# Patient Record
Sex: Female | Born: 1950 | Race: Black or African American | Hispanic: No | Marital: Single | Smoking: Never smoker
Health system: Southern US, Community
[De-identification: ages and names within clinical notes are randomized; demographics above are authoritative.]

## PROBLEM LIST (undated history)

## (undated) DIAGNOSIS — C73 Malignant neoplasm of thyroid gland: Secondary | ICD-10-CM

## (undated) DIAGNOSIS — K219 Gastro-esophageal reflux disease without esophagitis: Secondary | ICD-10-CM

## (undated) DIAGNOSIS — I6523 Occlusion and stenosis of bilateral carotid arteries: Principal | ICD-10-CM

## (undated) DIAGNOSIS — C50919 Malignant neoplasm of unspecified site of unspecified female breast: Secondary | ICD-10-CM

## (undated) DIAGNOSIS — L84 Corns and callosities: Secondary | ICD-10-CM

## (undated) DIAGNOSIS — R002 Palpitations: Secondary | ICD-10-CM

## (undated) DIAGNOSIS — G4730 Sleep apnea, unspecified: Secondary | ICD-10-CM

## (undated) DIAGNOSIS — I10 Essential (primary) hypertension: Secondary | ICD-10-CM

## (undated) DIAGNOSIS — R079 Chest pain, unspecified: Secondary | ICD-10-CM

## (undated) DIAGNOSIS — E039 Hypothyroidism, unspecified: Secondary | ICD-10-CM

## (undated) DIAGNOSIS — E7800 Pure hypercholesterolemia, unspecified: Secondary | ICD-10-CM

## (undated) DIAGNOSIS — M1990 Unspecified osteoarthritis, unspecified site: Secondary | ICD-10-CM

## (undated) DIAGNOSIS — E119 Type 2 diabetes mellitus without complications: Secondary | ICD-10-CM

## (undated) DIAGNOSIS — R519 Headache, unspecified: Secondary | ICD-10-CM

## (undated) DIAGNOSIS — Z9289 Personal history of other medical treatment: Secondary | ICD-10-CM

## (undated) DIAGNOSIS — Z8719 Personal history of other diseases of the digestive system: Secondary | ICD-10-CM

## (undated) DIAGNOSIS — J45909 Unspecified asthma, uncomplicated: Secondary | ICD-10-CM

## (undated) DIAGNOSIS — G7000 Myasthenia gravis without (acute) exacerbation: Secondary | ICD-10-CM

## (undated) DIAGNOSIS — R011 Cardiac murmur, unspecified: Secondary | ICD-10-CM

## (undated) DIAGNOSIS — R262 Difficulty in walking, not elsewhere classified: Secondary | ICD-10-CM

## (undated) DIAGNOSIS — R0602 Shortness of breath: Secondary | ICD-10-CM

## (undated) HISTORY — PX: CHOLECYSTECTOMY: SHX55

## (undated) HISTORY — PX: ANTERIOR CERVICAL DECOMP/DISCECTOMY FUSION: SHX1161

## (undated) HISTORY — DX: Palpitations: R00.2

## (undated) HISTORY — PX: CARDIAC CATHETERIZATION: SHX172

## (undated) HISTORY — DX: Difficulty in walking, not elsewhere classified: R26.2

## (undated) HISTORY — PX: APPENDECTOMY: SHX54

## (undated) HISTORY — PX: ABDOMINAL HYSTERECTOMY: SHX81

## (undated) HISTORY — DX: Personal history of other medical treatment: Z92.89

## (undated) HISTORY — DX: Corns and callosities: L84

## (undated) HISTORY — DX: Pure hypercholesterolemia, unspecified: E78.00

## (undated) HISTORY — PX: SHOULDER ARTHROSCOPY W/ ROTATOR CUFF REPAIR: SHX2400

## (undated) HISTORY — PX: TOTAL THYROIDECTOMY: SHX2547

## (undated) HISTORY — PX: CATARACT EXTRACTION W/ INTRAOCULAR LENS  IMPLANT, BILATERAL: SHX1307

## (undated) HISTORY — PX: CARPAL TUNNEL RELEASE: SHX101

## (undated) HISTORY — PX: KNEE ARTHROSCOPY: SHX127

## (undated) HISTORY — DX: Malignant neoplasm of unspecified site of unspecified female breast: C50.919

## (undated) HISTORY — PX: TONSILLECTOMY: SUR1361

## (undated) HISTORY — DX: Occlusion and stenosis of bilateral carotid arteries: I65.23

---

## 1998-02-18 ENCOUNTER — Ambulatory Visit (HOSPITAL_COMMUNITY): Admission: RE | Admit: 1998-02-18 | Discharge: 1998-02-18 | Payer: Self-pay | Admitting: Cardiovascular Disease

## 1998-02-22 ENCOUNTER — Ambulatory Visit: Admission: RE | Admit: 1998-02-22 | Discharge: 1998-02-22 | Payer: Self-pay | Admitting: Interventional Cardiology

## 1998-02-26 ENCOUNTER — Inpatient Hospital Stay (HOSPITAL_COMMUNITY): Admission: EM | Admit: 1998-02-26 | Discharge: 1998-02-28 | Payer: Self-pay | Admitting: Emergency Medicine

## 1998-05-21 ENCOUNTER — Ambulatory Visit (HOSPITAL_COMMUNITY): Admission: RE | Admit: 1998-05-21 | Discharge: 1998-05-21 | Payer: Self-pay | Admitting: *Deleted

## 1998-06-11 ENCOUNTER — Ambulatory Visit (HOSPITAL_COMMUNITY): Admission: RE | Admit: 1998-06-11 | Discharge: 1998-06-11 | Payer: Self-pay | Admitting: *Deleted

## 1999-10-08 ENCOUNTER — Encounter: Admission: RE | Admit: 1999-10-08 | Discharge: 2000-01-06 | Payer: Self-pay | Admitting: Internal Medicine

## 1999-10-08 ENCOUNTER — Encounter (HOSPITAL_BASED_OUTPATIENT_CLINIC_OR_DEPARTMENT_OTHER): Payer: Self-pay | Admitting: Internal Medicine

## 2000-05-24 ENCOUNTER — Ambulatory Visit (HOSPITAL_COMMUNITY): Admission: RE | Admit: 2000-05-24 | Discharge: 2000-05-24 | Payer: Self-pay | Admitting: Endocrinology

## 2000-05-24 ENCOUNTER — Encounter: Payer: Self-pay | Admitting: Endocrinology

## 2000-08-09 ENCOUNTER — Ambulatory Visit (HOSPITAL_COMMUNITY): Admission: RE | Admit: 2000-08-09 | Discharge: 2000-08-09 | Payer: Self-pay | Admitting: Neurosurgery

## 2000-08-09 ENCOUNTER — Encounter: Payer: Self-pay | Admitting: Neurosurgery

## 2000-09-01 ENCOUNTER — Encounter: Payer: Self-pay | Admitting: Neurosurgery

## 2000-09-01 ENCOUNTER — Encounter: Admission: RE | Admit: 2000-09-01 | Discharge: 2000-09-01 | Payer: Self-pay | Admitting: Neurosurgery

## 2000-09-16 ENCOUNTER — Encounter: Admission: RE | Admit: 2000-09-16 | Discharge: 2000-09-16 | Payer: Self-pay | Admitting: Neurosurgery

## 2000-09-16 ENCOUNTER — Encounter: Payer: Self-pay | Admitting: Neurosurgery

## 2000-12-02 ENCOUNTER — Inpatient Hospital Stay (HOSPITAL_COMMUNITY): Admission: EM | Admit: 2000-12-02 | Discharge: 2000-12-04 | Payer: Self-pay

## 2000-12-02 ENCOUNTER — Encounter: Payer: Self-pay | Admitting: Interventional Cardiology

## 2000-12-02 ENCOUNTER — Encounter: Payer: Self-pay | Admitting: Emergency Medicine

## 2000-12-14 ENCOUNTER — Encounter (INDEPENDENT_AMBULATORY_CARE_PROVIDER_SITE_OTHER): Payer: Self-pay | Admitting: Specialist

## 2000-12-14 ENCOUNTER — Ambulatory Visit (HOSPITAL_COMMUNITY): Admission: RE | Admit: 2000-12-14 | Discharge: 2000-12-14 | Payer: Self-pay | Admitting: Gastroenterology

## 2001-05-19 ENCOUNTER — Ambulatory Visit (HOSPITAL_COMMUNITY): Admission: RE | Admit: 2001-05-19 | Discharge: 2001-05-19 | Payer: Self-pay | Admitting: Neurosurgery

## 2001-05-19 ENCOUNTER — Encounter: Payer: Self-pay | Admitting: Neurosurgery

## 2001-05-23 ENCOUNTER — Encounter: Payer: Self-pay | Admitting: Neurosurgery

## 2001-05-25 ENCOUNTER — Inpatient Hospital Stay (HOSPITAL_COMMUNITY): Admission: RE | Admit: 2001-05-25 | Discharge: 2001-05-27 | Payer: Self-pay | Admitting: Neurosurgery

## 2001-05-25 ENCOUNTER — Encounter: Payer: Self-pay | Admitting: Neurosurgery

## 2001-06-02 ENCOUNTER — Encounter: Admission: RE | Admit: 2001-06-02 | Discharge: 2001-06-02 | Payer: Self-pay | Admitting: Neurosurgery

## 2001-06-02 ENCOUNTER — Encounter: Payer: Self-pay | Admitting: Neurosurgery

## 2001-06-23 ENCOUNTER — Encounter: Payer: Self-pay | Admitting: Neurosurgery

## 2001-06-23 ENCOUNTER — Encounter: Admission: RE | Admit: 2001-06-23 | Discharge: 2001-06-23 | Payer: Self-pay | Admitting: Neurosurgery

## 2001-07-12 ENCOUNTER — Encounter: Payer: Self-pay | Admitting: Neurosurgery

## 2001-07-12 ENCOUNTER — Encounter: Admission: RE | Admit: 2001-07-12 | Discharge: 2001-07-12 | Payer: Self-pay | Admitting: Neurosurgery

## 2001-08-01 ENCOUNTER — Ambulatory Visit (HOSPITAL_COMMUNITY): Admission: RE | Admit: 2001-08-01 | Discharge: 2001-08-01 | Payer: Self-pay | Admitting: Neurosurgery

## 2001-08-01 ENCOUNTER — Encounter: Payer: Self-pay | Admitting: Neurosurgery

## 2002-03-15 ENCOUNTER — Encounter: Payer: Self-pay | Admitting: Neurosurgery

## 2002-03-17 ENCOUNTER — Ambulatory Visit (HOSPITAL_COMMUNITY): Admission: RE | Admit: 2002-03-17 | Discharge: 2002-03-17 | Payer: Self-pay | Admitting: Neurosurgery

## 2002-07-10 ENCOUNTER — Encounter: Payer: Self-pay | Admitting: Orthopedic Surgery

## 2002-07-10 ENCOUNTER — Ambulatory Visit (HOSPITAL_COMMUNITY): Admission: RE | Admit: 2002-07-10 | Discharge: 2002-07-10 | Payer: Self-pay | Admitting: Orthopedic Surgery

## 2002-10-31 ENCOUNTER — Inpatient Hospital Stay (HOSPITAL_COMMUNITY): Admission: EM | Admit: 2002-10-31 | Discharge: 2002-11-04 | Payer: Self-pay | Admitting: Emergency Medicine

## 2002-10-31 ENCOUNTER — Encounter: Payer: Self-pay | Admitting: Emergency Medicine

## 2002-11-01 ENCOUNTER — Encounter: Payer: Self-pay | Admitting: Interventional Cardiology

## 2002-11-29 ENCOUNTER — Encounter: Admission: RE | Admit: 2002-11-29 | Discharge: 2002-11-29 | Payer: Self-pay | Admitting: Neurosurgery

## 2002-11-29 ENCOUNTER — Encounter: Payer: Self-pay | Admitting: Neurosurgery

## 2003-07-20 ENCOUNTER — Ambulatory Visit (HOSPITAL_COMMUNITY): Admission: RE | Admit: 2003-07-20 | Discharge: 2003-07-20 | Payer: Self-pay | Admitting: Internal Medicine

## 2003-11-08 ENCOUNTER — Encounter: Admission: RE | Admit: 2003-11-08 | Discharge: 2003-11-08 | Payer: Self-pay | Admitting: Internal Medicine

## 2003-11-12 ENCOUNTER — Inpatient Hospital Stay (HOSPITAL_COMMUNITY): Admission: AD | Admit: 2003-11-12 | Discharge: 2003-11-17 | Payer: Self-pay | Admitting: Internal Medicine

## 2004-11-04 ENCOUNTER — Encounter: Admission: RE | Admit: 2004-11-04 | Discharge: 2004-11-04 | Payer: Self-pay | Admitting: Internal Medicine

## 2005-02-06 ENCOUNTER — Encounter: Admission: RE | Admit: 2005-02-06 | Discharge: 2005-02-06 | Payer: Self-pay | Admitting: Internal Medicine

## 2005-04-15 ENCOUNTER — Encounter: Admission: RE | Admit: 2005-04-15 | Discharge: 2005-04-15 | Payer: Self-pay | Admitting: Otolaryngology

## 2005-04-15 ENCOUNTER — Other Ambulatory Visit: Admission: RE | Admit: 2005-04-15 | Discharge: 2005-04-15 | Payer: Self-pay | Admitting: Interventional Radiology

## 2005-04-15 ENCOUNTER — Encounter (INDEPENDENT_AMBULATORY_CARE_PROVIDER_SITE_OTHER): Payer: Self-pay | Admitting: *Deleted

## 2005-05-06 ENCOUNTER — Ambulatory Visit (HOSPITAL_COMMUNITY): Admission: RE | Admit: 2005-05-06 | Discharge: 2005-05-07 | Payer: Self-pay | Admitting: Otolaryngology

## 2005-05-06 ENCOUNTER — Encounter (INDEPENDENT_AMBULATORY_CARE_PROVIDER_SITE_OTHER): Payer: Self-pay | Admitting: Specialist

## 2005-05-22 ENCOUNTER — Encounter (INDEPENDENT_AMBULATORY_CARE_PROVIDER_SITE_OTHER): Payer: Self-pay | Admitting: Specialist

## 2005-05-22 ENCOUNTER — Ambulatory Visit (HOSPITAL_COMMUNITY): Admission: AD | Admit: 2005-05-22 | Discharge: 2005-05-24 | Payer: Self-pay | Admitting: Otolaryngology

## 2005-07-10 ENCOUNTER — Encounter (HOSPITAL_COMMUNITY): Admission: RE | Admit: 2005-07-10 | Discharge: 2005-08-18 | Payer: Self-pay | Admitting: Endocrinology

## 2005-07-30 ENCOUNTER — Encounter (HOSPITAL_COMMUNITY): Admission: RE | Admit: 2005-07-30 | Discharge: 2005-10-28 | Payer: Self-pay | Admitting: Endocrinology

## 2005-12-23 ENCOUNTER — Encounter: Payer: Self-pay | Admitting: *Deleted

## 2006-01-28 ENCOUNTER — Encounter: Admission: RE | Admit: 2006-01-28 | Discharge: 2006-01-28 | Payer: Self-pay | Admitting: Internal Medicine

## 2006-08-02 ENCOUNTER — Encounter (HOSPITAL_COMMUNITY): Admission: RE | Admit: 2006-08-02 | Discharge: 2006-08-06 | Payer: Self-pay | Admitting: Endocrinology

## 2006-11-04 ENCOUNTER — Inpatient Hospital Stay (HOSPITAL_COMMUNITY): Admission: EM | Admit: 2006-11-04 | Discharge: 2006-11-06 | Payer: Self-pay | Admitting: Emergency Medicine

## 2007-07-13 ENCOUNTER — Encounter: Admission: RE | Admit: 2007-07-13 | Discharge: 2007-07-13 | Payer: Self-pay | Admitting: Endocrinology

## 2007-12-09 ENCOUNTER — Encounter: Admission: RE | Admit: 2007-12-09 | Discharge: 2007-12-09 | Payer: Self-pay | Admitting: Cardiology

## 2008-03-07 ENCOUNTER — Emergency Department (HOSPITAL_COMMUNITY): Admission: EM | Admit: 2008-03-07 | Discharge: 2008-03-07 | Payer: Self-pay | Admitting: Emergency Medicine

## 2008-06-22 ENCOUNTER — Ambulatory Visit (HOSPITAL_BASED_OUTPATIENT_CLINIC_OR_DEPARTMENT_OTHER): Admission: RE | Admit: 2008-06-22 | Discharge: 2008-06-22 | Payer: Self-pay | Admitting: Ophthalmology

## 2008-08-06 ENCOUNTER — Encounter (HOSPITAL_COMMUNITY): Admission: RE | Admit: 2008-08-06 | Discharge: 2008-08-23 | Payer: Self-pay | Admitting: Endocrinology

## 2008-09-20 ENCOUNTER — Emergency Department (HOSPITAL_BASED_OUTPATIENT_CLINIC_OR_DEPARTMENT_OTHER): Admission: EM | Admit: 2008-09-20 | Discharge: 2008-09-20 | Payer: Self-pay | Admitting: Internal Medicine

## 2008-09-20 ENCOUNTER — Ambulatory Visit: Payer: Self-pay | Admitting: Diagnostic Radiology

## 2009-04-11 ENCOUNTER — Ambulatory Visit (HOSPITAL_COMMUNITY): Admission: RE | Admit: 2009-04-11 | Discharge: 2009-04-11 | Payer: Self-pay | Admitting: Ophthalmology

## 2009-06-27 ENCOUNTER — Ambulatory Visit (HOSPITAL_COMMUNITY): Admission: RE | Admit: 2009-06-27 | Discharge: 2009-06-27 | Payer: Self-pay | Admitting: Ophthalmology

## 2010-02-21 ENCOUNTER — Ambulatory Visit: Payer: Self-pay | Admitting: Diagnostic Radiology

## 2010-02-21 ENCOUNTER — Emergency Department (HOSPITAL_BASED_OUTPATIENT_CLINIC_OR_DEPARTMENT_OTHER): Admission: EM | Admit: 2010-02-21 | Discharge: 2010-02-21 | Payer: Self-pay | Admitting: Emergency Medicine

## 2010-03-25 ENCOUNTER — Encounter: Admission: RE | Admit: 2010-03-25 | Discharge: 2010-03-25 | Payer: Self-pay | Admitting: Internal Medicine

## 2010-06-03 ENCOUNTER — Ambulatory Visit (HOSPITAL_COMMUNITY): Admission: RE | Admit: 2010-06-03 | Discharge: 2010-06-03 | Payer: Self-pay | Admitting: Ophthalmology

## 2010-08-16 ENCOUNTER — Encounter
Admission: RE | Admit: 2010-08-16 | Discharge: 2010-08-16 | Payer: Self-pay | Source: Home / Self Care | Attending: Neurosurgery | Admitting: Neurosurgery

## 2010-09-14 ENCOUNTER — Encounter: Payer: Self-pay | Admitting: Internal Medicine

## 2010-09-14 ENCOUNTER — Encounter: Payer: Self-pay | Admitting: Endocrinology

## 2010-11-09 LAB — CBC
Hemoglobin: 12.6 g/dL (ref 12.0–15.0)
MCH: 30.9 pg (ref 26.0–34.0)
MCHC: 33.6 g/dL (ref 30.0–36.0)
Platelets: 216 10*3/uL (ref 150–400)
RDW: 12 % (ref 11.5–15.5)

## 2010-11-09 LAB — DIFFERENTIAL
Eosinophils Absolute: 0 10*3/uL (ref 0.0–0.7)
Eosinophils Relative: 0 % (ref 0–5)
Lymphocytes Relative: 17 % (ref 12–46)
Lymphs Abs: 1.3 10*3/uL (ref 0.7–4.0)
Monocytes Relative: 6 % (ref 3–12)

## 2010-11-09 LAB — COMPREHENSIVE METABOLIC PANEL
ALT: 26 U/L (ref 0–35)
AST: 20 U/L (ref 0–37)
Albumin: 3.9 g/dL (ref 3.5–5.2)
Calcium: 9.3 mg/dL (ref 8.4–10.5)
Creatinine, Ser: 0.6 mg/dL (ref 0.4–1.2)
GFR calc Af Amer: >60 mL/min (ref >60)
GFR calc non Af Amer: >60 mL/min (ref >60)
Sodium: 140 mEq/L (ref 135–145)
Total Protein: 6.9 g/dL (ref 6.0–8.3)

## 2010-11-09 LAB — GLUCOSE, CAPILLARY
Glucose-Capillary: 236 mg/dL — ABNORMAL HIGH (ref 70–99)
Glucose-Capillary: 404 mg/dL — ABNORMAL HIGH (ref 70–99)

## 2010-11-26 LAB — URINALYSIS, ROUTINE W REFLEX MICROSCOPIC
Leukocytes, UA: NEGATIVE
Nitrite: NEGATIVE
Specific Gravity, Urine: 1.029 (ref 1.005–1.030)
Urobilinogen, UA: 0.2 mg/dL (ref 0.0–1.0)
pH: 5.5 (ref 5.0–8.0)

## 2010-11-26 LAB — CBC
HCT: 37.6 % (ref 36.0–46.0)
Hemoglobin: 13.1 g/dL (ref 12.0–15.0)
MCV: 91.6 fL (ref 78.0–100.0)
Platelets: 256 10*3/uL (ref 150–400)
RDW: 13.3 % (ref 11.5–15.5)

## 2010-11-26 LAB — COMPREHENSIVE METABOLIC PANEL
Alkaline Phosphatase: 123 U/L — ABNORMAL HIGH (ref 39–117)
BUN: 7 mg/dL (ref 6–23)
Chloride: 105 mEq/L (ref 96–112)
Creatinine, Ser: 0.6 mg/dL (ref 0.4–1.2)
Glucose, Bld: 105 mg/dL — ABNORMAL HIGH (ref 70–99)
Potassium: 3.5 mEq/L (ref 3.5–5.1)
Total Bilirubin: 0.5 mg/dL (ref 0.3–1.2)
Total Protein: 6.7 g/dL (ref 6.0–8.3)

## 2010-11-26 LAB — GLUCOSE, CAPILLARY: Glucose-Capillary: 123 mg/dL — ABNORMAL HIGH (ref 70–99)

## 2010-11-26 LAB — DIFFERENTIAL
Basophils Absolute: 0 10*3/uL (ref 0.0–0.1)
Basophils Relative: 1 % (ref 0–1)
Lymphocytes Relative: 37 % (ref 12–46)
Neutro Abs: 4.2 10*3/uL (ref 1.7–7.7)
Neutrophils Relative %: 57 % (ref 43–77)

## 2010-11-26 LAB — URINE MICROSCOPIC-ADD ON

## 2010-11-29 LAB — CBC
HCT: 38.5 % (ref 36.0–46.0)
Hemoglobin: 13.2 g/dL (ref 12.0–15.0)
MCV: 91.7 fL (ref 78.0–100.0)
RDW: 12.7 % (ref 11.5–15.5)

## 2010-11-29 LAB — APTT: aPTT: 29 seconds (ref 24–37)

## 2010-11-29 LAB — URINALYSIS, ROUTINE W REFLEX MICROSCOPIC
Bilirubin Urine: NEGATIVE
Leukocytes, UA: NEGATIVE
Nitrite: NEGATIVE
Specific Gravity, Urine: 1.039 — ABNORMAL HIGH (ref 1.005–1.030)
pH: 6 (ref 5.0–8.0)

## 2010-11-29 LAB — BASIC METABOLIC PANEL
CO2: 26 mEq/L (ref 19–32)
Chloride: 101 mEq/L (ref 96–112)
GFR calc non Af Amer: >60 mL/min (ref >60)
Glucose, Bld: 309 mg/dL — ABNORMAL HIGH (ref 70–99)
Potassium: 3.8 mEq/L (ref 3.5–5.1)
Sodium: 137 mEq/L (ref 135–145)

## 2010-11-29 LAB — URINE MICROSCOPIC-ADD ON

## 2010-11-29 LAB — GLUCOSE, CAPILLARY

## 2010-12-08 LAB — BASIC METABOLIC PANEL
BUN: 11 mg/dL (ref 6–23)
Chloride: 101 mEq/L (ref 96–112)
Glucose, Bld: 374 mg/dL — ABNORMAL HIGH (ref 70–99)
Potassium: 4 mEq/L (ref 3.5–5.1)

## 2010-12-08 LAB — CBC
HCT: 38 % (ref 36.0–46.0)
MCHC: 34 g/dL (ref 30.0–36.0)
MCV: 90.4 fL (ref 78.0–100.0)
Platelets: 210 10*3/uL (ref 150–400)
RDW: 12.2 % (ref 11.5–15.5)

## 2010-12-08 LAB — D-DIMER, QUANTITATIVE: D-Dimer, Quant: 0.28 ug/mL-FEU (ref 0.00–0.48)

## 2010-12-08 LAB — DIFFERENTIAL
Basophils Absolute: 0.1 10*3/uL (ref 0.0–0.1)
Eosinophils Absolute: 0 10*3/uL (ref 0.0–0.7)
Eosinophils Relative: 0 % (ref 0–5)

## 2010-12-08 LAB — POCT CARDIAC MARKERS: Troponin i, poc: <0.05 ng/mL (ref 0.00–0.09)

## 2010-12-10 ENCOUNTER — Other Ambulatory Visit: Payer: Self-pay | Admitting: Orthopedic Surgery

## 2010-12-10 ENCOUNTER — Encounter (HOSPITAL_COMMUNITY): Payer: BC Managed Care – PPO

## 2010-12-10 LAB — BASIC METABOLIC PANEL
CO2: 25 mEq/L (ref 19–32)
Calcium: 9.5 mg/dL (ref 8.4–10.5)
GFR calc Af Amer: >60 mL/min (ref >60)
GFR calc non Af Amer: >60 mL/min (ref >60)
Sodium: 139 mEq/L (ref 135–145)

## 2010-12-10 LAB — CBC
HCT: 39.4 % (ref 36.0–46.0)
Hemoglobin: 13.2 g/dL (ref 12.0–15.0)
MCH: 29.2 pg (ref 26.0–34.0)
MCHC: 33.5 g/dL (ref 30.0–36.0)

## 2010-12-10 LAB — SURGICAL PCR SCREEN: MRSA, PCR: NEGATIVE

## 2010-12-11 ENCOUNTER — Ambulatory Visit (HOSPITAL_COMMUNITY)
Admission: RE | Admit: 2010-12-11 | Discharge: 2010-12-11 | Disposition: A | Discharge status: Home / Self Care | Payer: BC Managed Care – PPO | Source: Ambulatory Visit | Attending: Orthopedic Surgery | Admitting: Orthopedic Surgery

## 2010-12-11 ENCOUNTER — Other Ambulatory Visit: Payer: Self-pay | Admitting: Orthopedic Surgery

## 2010-12-11 DIAGNOSIS — Z01812 Encounter for preprocedural laboratory examination: Secondary | ICD-10-CM | POA: Insufficient documentation

## 2010-12-11 DIAGNOSIS — M25819 Other specified joint disorders, unspecified shoulder: Secondary | ICD-10-CM | POA: Insufficient documentation

## 2010-12-11 DIAGNOSIS — I10 Essential (primary) hypertension: Secondary | ICD-10-CM | POA: Insufficient documentation

## 2010-12-11 DIAGNOSIS — J45909 Unspecified asthma, uncomplicated: Secondary | ICD-10-CM | POA: Insufficient documentation

## 2010-12-11 DIAGNOSIS — Z981 Arthrodesis status: Secondary | ICD-10-CM | POA: Insufficient documentation

## 2010-12-11 DIAGNOSIS — M67919 Unspecified disorder of synovium and tendon, unspecified shoulder: Secondary | ICD-10-CM | POA: Insufficient documentation

## 2010-12-11 DIAGNOSIS — M19019 Primary osteoarthritis, unspecified shoulder: Secondary | ICD-10-CM | POA: Insufficient documentation

## 2010-12-11 DIAGNOSIS — M719 Bursopathy, unspecified: Secondary | ICD-10-CM | POA: Insufficient documentation

## 2010-12-11 DIAGNOSIS — G7000 Myasthenia gravis without (acute) exacerbation: Secondary | ICD-10-CM | POA: Insufficient documentation

## 2010-12-11 DIAGNOSIS — M7530 Calcific tendinitis of unspecified shoulder: Secondary | ICD-10-CM | POA: Insufficient documentation

## 2010-12-11 LAB — GLUCOSE, CAPILLARY: Glucose-Capillary: 375 mg/dL — ABNORMAL HIGH (ref 70–99)

## 2010-12-12 NOTE — Op Note (Addendum)
NAMEABBY, [name]               ACCOUNT NO.:  [id number]  MEDICAL RECORD NO.:  [id number]           PATIENT TYPE:  O  LOCATION:  DAYL                         FACILITY:  [facility]  PHYSICIAN:  [name] Broom, MD    DATE OF BIRTH:  1950-11-07  DATE OF PROCEDURE:  12/11/2010 DATE OF DISCHARGE:                              OPERATIVE REPORT   PREOPERATIVE DIAGNOSES: 1. Left shoulder calcific tendonitis. 2. Left shoulder impingement. 3. 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  POSTOPERATIVE DIAGNOSES: 1. Left shoulder calcific tendonitis. 2. Left shoulder longitudinal rotator cuff tear resulting from     excision of calcific tendonitis. 3. Left shoulder impingement. 4. 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  PROCEDURE PERFORMED: 1. Left shoulder arthroscopic extensive debridement of calcific     tendonitis. 2. Left shoulder arthroscopic rotator cuff repair. 3. Left shoulder arthroscopic subacromial decompression. 4. Left shoulder arthroscopic distal clavicle excision.  ATTENDING SURGEON:  [name] Broom, MD  ASSISTANT:  None.  ANESTHESIA:  GETA.  COMPLICATIONS:  None.  DRAINS:  None.  SPECIMENS:  None.  ESTIMATED BLOOD LOSS:  Minimal.  INDICATIONS FOR SURGERY:  The patient is a 60 year old female with a long history of bilateral shoulder pain with calcific tendonitis which has failed nonoperative management.  She wished to go forward with arthroscopic excision of the calcific tendonitis for pain relief.  She understood risks, benefits and alternatives of the procedure including but not limited to risk of bleeding, infection, damage to neurovascular structures, risk of stiffness and potential for rotator cuff tear.  She understood all this and elected to go forward with surgery.  OPERATIVE FINDINGS:  Examination under anesthesia demonstrated no stiffness or instability.  Diagnostic arthroscopy revealed no  significant intra-articular pathology.  Biceps was intact.  Subscapularis was intact.  Undersurface of the rotator cuff was healthy appearing.  No loose bodies were noted in the joint.  In the subacromial space, she was noted to have extensive bursitis.  This was debrided.  Once the bursal surface of the rotator cuff was exposed, she was noted to have very extensive calcific deposit which was localized with needle localization.  It was debrided extensively removing large quantities of chalky white calcium.  This resulted in a longitudinal tear of the rotator cuff, which was repaired using 1-0 PDS suture in a side-to-side fashion.  She was noted to have a moderate-sized anterior acromial spur which was taken down with standard acromioplasty and significant AC joint DJD and arthroscopic distal clavicle excision.  DESCRIPTION OF PROCEDURE:  The patient was identified in the preoperative holding area where I personally marked the operative side after verifying side and procedure with the patient.  She was taken back to the operating room where general anesthesia was induced without complication.  The appropriate time-out procedure was carried out.  She did receive IV antibiotics within 30 minutes of incision.  She was placed in the beach-chair position with all extremities carefully padded and positioned.  The left upper extremity was then prepped and draped in a standard sterile fashion.  Standard posterior portal was established, and the arthroscope was introduced into the joint.  An anterior portal was then established above the subscapularis with needle localization and diagnostic arthroscopy was carried out with findings as described above.  The arthroscope was then introduced in the subacromial space where she was noted to have extensive bursitis.  Lateral portal was established with needle localization and shaver was introduced to perform extensive bursectomy.  After the bursa was  completely excised, the bursal surface of the rotator cuff was carefully examined.  Just anterior to the equator of the humeral head, she was noted to have very extensive calcium deposit.  I started by opening it laterally near the rotator cuff insertion.  This was done with percutaneous needle and then an 11 blade was introduced through the lateral portal to extend the incision slightly.  I then used a combination of ring curette and shaver in a probe to systematically remove large quantities of the chalky calcific deposit.  I had to keep extending the incision medially to the level of the musculotendinous junction to fully excise all the calcium. I did this by going back and forth between the posterior and lateral portal with instruments and camera.  I spent approximately 45 minutes just resecting all the calcium.  At the conclusion of resection, I felt that I had adequately debrided as much calcium as I possibly could.  No full-thickness rotator cuff tear was identified.  However, given the longitudinal split in the tendon, I did feel that a repair would be beneficial.  Therefore, I used BirdBeak penetrator to pass the zero PDS suture through the anterior leaf and through the posterior leaf and then tied it with a side-to-side simple stitch.  This brought the tendon together nicely.  The arthroscope was then introduced into the posterior portal again, and the bur was used from lateral portal to perform standard acromioplasty after the CA ligament was taken down with the ArthroCare.  She was noted to have a moderate-sized anteromedial spur. The distal clavicle and AC joint were then exposed arthroscopically and a standard acromioplasty was carried out with the small cannula anteriorly in the [facility] joint.  The bur was first used from the lateral portal to take off the undersurface of the distal clavicle and then from the anterior portal while viewing posteriorly to take off approximately 8  mm of the distal clavicle in a smooth even resection.  The resection was viewed from the anterior and lateral portals and was felt to be adequate.  Collene Mares was used into the joint to debride any bone dust, and then the arthroscopic equipment was removed from the joint and portals were closed with 3-0 nylon in an interrupted fashion.  Sterile dressings were then applied including Xeroform, 4x4s, ABDs and tape.  The patient was placed in a sling, allowed to awaken from general anesthesia, transferred to the stretcher and taken to recovery room in stable condition.  POSTOPERATIVE PLAN:  She will be discharged home today with her family in stable condition as long as she does well in the recovery room.  If there is any question, we will keep her overnight.  She will follow up with me in 1 week for suture removal and wound check.  In the meantime, she will stay in her sling.     [name] Broom, MD     JC/MEDQ  D:  12/11/2010  T:  12/11/2010  Job:  [id number]  Electronically Signed by [name]  on 12/12/2010 08:20:49 AM Electronically Signed by [name]  on 12/12/2010 08:21:28 AM

## 2011-01-09 NOTE — Consult Note (Signed)
[name], [name]                         ACCOUNT NO.:  [id number]   MEDICAL RECORD NO.:  [id number]                   PATIENT TYPE:  INP   LOCATION:  3715                                 FACILITY:   PHYSICIAN:  Anselmo Rod, M.D.               DATE OF BIRTH:  [birth year]   DATE OF CONSULTATION:  11/01/2002  DATE OF DISCHARGE:                                   CONSULTATION   REASON FOR CONSULTATION:  Nausea and vomiting.   HISTORY OF PRESENT ILLNESS:  The patient is a 60 year old African American  female with diabetes, history of hiatal hernia, COPD admitted on October 31, 2002 for chest pain, rule out MI.  This morning she had an emesis,  approximately 2600 ml of yellow clear liquid.  There was no blood or coffee  grounds in the emesis.  [name] had a similar episode approximately  one to two weeks ago which was treated by Dr. Evlyn Kanner on an outpatient basis  with meclizine with significant relief.  She states she has not had  meclizine in the last 48 hours.  She is ruling out for an MI.  However, her  symptoms of nausea and room spinning has worsened in the last 48 hours,  approximately the same time course that she has not had meclizine.  She  states the vertigo symptoms are associated even when her eyes are shut.  She  denies any nausea or vomiting closely associated with meals or abdominal  pain.  Denies hematemesis, hemoptysis, _________.  Denies melena and bright  red blood per rectum. She denies fevers, chills, constipation or diarrhea.  Denies also chronic NSAID use.   ALLERGIES:  CODEINE which causes her to have nausea and vomiting.   PAST MEDICAL HISTORY:  1. Cholecystectomy.  2. Hysterectomy.  3. Right carpal tunnel release.  4. C3-4 diskectomy.  5. Appendectomy.  6. Diabetes type 2.  7. Gastroesophageal reflux disease.  8. COPD.   SOCIAL HISTORY:  Denies alcohol, tobacco and IV drug use with occasional  NSAIDs for intermittent headaches.   FAMILY  HISTORY:  Father passed away at 42 years of age of lung cancer.  Otherwise there are no other GI cancers; specifically, colon, stomach or  kidney that runs in the family.   REVIEW OF SYSTEMS:  Per HPI.   PHYSICAL EXAMINATION:  VITAL SIGNS:  Stable and afebrile.  GENERAL:  In no acute distress.  HEENT:  No oral lesions.  Pupils are equal, round and reactive to light.  Extraocular movements are intact.  NECK:  Full with no JVP or LID.  CARDIOVASCULAR:  Regular rate and rhythm.  No murmurs, rubs or gallops.  RESPIRATORY:  Lungs are clear to auscultation bilaterally with good breath  sounds.  ABDOMEN:  Soft, nontender, nondistended with good bowel sounds.  RECTAL:  Digital rectal exam was minimal stool in the vault, proper  sphincter tone and heme-negative.  EXTREMITIES:  No clubbing, cyanosis or edema.   LABORATORY DATA:  Chest x-ray has no acute disease.  CT scan of the chest  with contrast, rule out PE, is negative for pulmonary embolus, showed only  minimal basilar linear atelectic changes.  White count 8.7, hemoglobin 10.9,  hematocrit 31.5, platelets 185, MCV 89.  Sodium 142, potassium 2.8, chloride  110, bicarb 27, glucose 107, BUN 6, creatinine 0.6, calcium 8.6, cholesterol  115 with triglycerides 59, HDL 51, LDL 95.  PT 13, INR 0.9, PTT 148 down  from 200 which was up from 31 after being started on heparin.  Cardiac  enzymes x 2 negative.  Third set of CK-MBs are negative.  Troponins are  still pending.   IMPRESSION AND PLAN:  1. Nausea, vomiting; low likelihood of this being caused by gastroparesis     secondary to the acute onset.  However, peptic ulcer and duodenal ulcer     are still in the differential.  We will plan for EGD prior to discharge.     Continue PPI and will keep her NPO after midnight.  Would recommend that     you also consider empiric treatment with meclizine.  May need further     workup for this if EGD is negative.  2. Hypokalemia.  Replete and check a  magnesium level.  3. Coronary artery disease.  4. Diabetes.  5. Chronic obstructive pulmonary disease.     [name], M.D.                   Anselmo Rod, M.D.    JD/MEDQ  D:  11/01/2002  T:  11/02/2002  Job:  [id number]   cc:   Lesleigh Noe, M.D.  [address]  [address]  [place]  [place] [address]  Fax: [phone]   Tera Mater. Evlyn Kanner, M.D.  [address]  [place]  [place] [address]  Fax: [phone]

## 2011-01-09 NOTE — Op Note (Signed)
[name], [name]               ACCOUNT NO.:  [id number]   MEDICAL RECORD NO.:  [id number]          PATIENT TYPE:  OIB   LOCATION:  2869                         FACILITY:  [facility]   PHYSICIAN:  Jefry H. Pollyann Kennedy, MD     DATE OF BIRTH:  02/08/1951   DATE OF PROCEDURE:  05/06/2005  DATE OF DISCHARGE:                                 OPERATIVE REPORT   PREOP DIAGNOSIS:  Right thyroid mass.   POSTOPERATIVE DIAGNOSIS:  Right thyroid mass.   PROCEDURE:  Right thyroid lobectomy.   SURGEON:  Pollyann Kennedy.   ASSISTANT:  Jenne Pane.   General endotracheal anesthesia was used. No complications.   FINDINGS:  Approximately 2 cm mass contained within the upper aspect of the  right thyroid lobe. 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 No complications.   BLOOD LOSS:  Minimal.   HISTORY:  This is a 54-year lady found to have a thyroid nodule on the right  side recently. Needle aspiration biopsy contained follicular cells  nondiagnostic for benign or carcinomatous lesion. Risks, benefits,  alternative complications of procedure explained to the patient, who seemed  to understand and agreed to surgery.   PROCEDURE:  The patient was taken to the operating room, placed on the  operating table in supine position. Following the induction of general  endotracheal anesthesia, the neck was prepped and draped in standard  fashion. Previous scar on the neck horizontal orientation was extended for  this procedure. Electrocautery was used to incise the skin, subcutaneous  tissue through the platysma layer. Subplatysmal flap was developed  superiorly to the thyroid notch, inferiorly to the sternum and clavicle.  Thyroid retractor was used to retain the flaps. Midline fascia was divided  and the strap muscles were reflected laterally off the thyroid lobe on the  right side. Using medial traction on the gland, the dissection was then  accomplished staying immediately adjacent to the capsule of the gland. The  superior vasculature was ligated between clamps and divided. The middle  thyroid vein was ligated as well. The inferior vasculature was ligated  between clamps and divided and a putative parathyroid was identified and  preserved with its blood supply. The recurrent nerve was identified and  preserved. The ligament of Allyson Sabal was divided using electrocautery and the  isthmus was divided in a similar fashion. The lesion was sent for frozen  section evaluation. Wound was irrigated with saline closed in layers using 4-  0 chromic on the midline fascia and the platysma layer. A  running 4-0 nylon subcuticular skin closure was used. Steri-Strips and  Benzoin were applied as well. A #7-French round JP drain was left in the  wound exiting through the lateral aspect of the incision on the left secured  in place with nylon suture. The patient was awakened, extubated, transferred  to recovery in stable condition.      Jefry H. Pollyann Kennedy, MD  Electronically Signed     JHR/MEDQ  D:  05/06/2005  T:  05/06/2005  Job:  [id number]

## 2011-01-09 NOTE — Op Note (Signed)
[place]  Patient:    [name], [name] Visit Number: [id number] MRN: [id number]          Service Type: DSU Location: [facility] 2899 22 Attending Physician:  Coletta Memos Dictated by:   Mena Goes. Franky Macho, M.D. Proc. Date: 03/17/02 Admit Date:  03/17/2002 Discharge Date: 03/17/2002                             Operative Report  PREOPERATIVE DIAGNOSIS:  Right carpal tunnel syndrome.  POSTOPERATIVE DIAGNOSIS:  Right carpal tunnel syndrome.  OPERATION PERFORMED:  Right carpal tunnel release.  SURGEON:  Kyle L. Franky Macho, M.D.  ANESTHESIA:  Local with IV sedation.  INDICATIONS FOR PROCEDURE:  The patient is a 60 year old woman with EMG proven right carpal tunnel syndrome.  Conservative measures did not improve the situation and I therefore recommended and she agreed to undergo operative decompression.  DESCRIPTION OF PROCEDURE:  [name] was brought to the operating room and given intravenous sedation.  The right upper extremity was prepped and she was draped in a sterile fashion.  I then infiltrated approximately 6 cc of 0.5% lidocaine 1:200,000 strength epinephrine into the right palm starting at the distal palmar crease and extending approximately 2 cm in to the palm and ulnar deviation from the midline.  I opened the incision with a #15 blade after adequate anesthesia was obtained.  Placed self-retaining retractor.  I then dissected carefully using a 15 blade and a snap hemostat until I reached the transverse carpal ligament.  I then was able to divide that sharply with a #15 blade.  I carried that into the wrist to the proximal crease and also distally into the hand itself until all pressure was taken off the medial nerve.  I then irrigated.  I then closed the wound in a single layer with vertical mattress sutures which were interrupted.  Sterile dressing was applied.  The patient tolerated the procedure without difficulty. Dictated by:    Mena Goes. Franky Macho, M.D. Attending Physician:  Coletta Memos DD:  03/17/02 TD:  03/20/02 Job: [id number]

## 2011-01-09 NOTE — Op Note (Signed)
[place]  Patient:    [name], [name] Visit Number: [id number] MRN: [id number]          Service Type: SUR Location: 3000 3009 01 Attending Physician:  Coletta Memos Dictated by:   Mena Goes. Franky Macho, M.D. Proc. 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  POSTOPERATIVE DIAGNOSIS:  Cervical displaced disk with myelopathy and cervicalgia.  OPERATION PERFORMED:  Anterior cervical diskectomy and arthrodesis, C3-4 with anterior plating and allograft 7 mm iliac crest bone wedge using small stature Synthes plating system.  COMPLICATIONS:  None.  SURGEON:  Kyle L. Franky Macho, M.D.  ASSISTANT:  Hewitt Shorts, M.D.  ANESTHESIA:  INDICATIONS FOR PROCEDURE:  The patient is a woman who presented and I have followed now since January.  She had new symptoms starting in August and was seen in September.  Those symptoms are consistent with myelopathy which she now has and did not have previously.  I told her that she needed to undergo urgent decompression of the neck.  She agreed and is admitted today for the procedure.  DESCRIPTION OF PROCEDURE:  The patient was brought to the operating room today and placed under general anesthesia without difficulty.  She was laid supine with her head in slight extension on a cerebellar horseshoe with 10 pounds of traction applied via chin strap.  The neck was prepped and she was draped in sterile fashion.  I infiltrated the most superior of her three cervical incisions with 2 cc of 0.5% lidocaine 1:200,000 strength epinephrine.  I opened that incision and then worked through scar tissues.  I opened the incision with a #10 blade and took this down through the subcutaneous tissues. I then used Metzenbaum scissors to dissect down to the level of the spine. Then using hand-held retractor 15 blade and a  Kitner, I was able to remove soft tissue and so identified the plate placed at C4-5.  That was an Atlantis system and I was able to undo the locking screws and then remove all four screws and then the plate.  I then turned my attention to the disk space.  I reflected the longus colli muscles, placed self-retaining Cloward retractor. I then placed two distraction pins one in C3, one in C4 and distracted the disk space. I proceeded with my diskectomy along with Dr. Newell Coral at C3-4 using microscope for visual aid.  After removing end plate and disk material and the posterior longitudinal ligament it was clear that the anterior canal was decompressed and that there was no pressure at the spinal cord at that level.  I then irrigated the wound.  I used the barrel shaped bur to smooth out the sides of C3 and C4.  I placed a 7 mm allograft without difficulty. Weight was taken off.  Plate was then placed, two screws in C4, two screws in C3.  X-ray was performed and showed the plate and plate and plug and screws to be in good position.  I then closed the wound with Vicryl sutures in the fascial layer and then I used a running 4-0 PDS in the subcuticular layer.  I then laid a piece of silicon gel over the wound and covered that with Steri-Strips.  This was done to retard keloid formation.  The patient was extubated moving all extremities. Dictated by:   Mena Goes. Franky Macho, M.D. Attending Physician:  Coletta Memos DD:  05/25/01 TD:  05/25/01 Job: [id number]

## 2011-01-09 NOTE — H&P (Signed)
[name], [name]                         ACCOUNT NO.:  [id number]   MEDICAL RECORD NO.:  [id number]                   PATIENT TYPE:  INP   LOCATION:  5712                                 FACILITY:  [facility]   PHYSICIAN:  [name]. [name], M.D.           DATE OF BIRTH:  22-Jun-1951   DATE OF ADMISSION:  11/12/2003  DATE OF DISCHARGE:  11/17/2003                                HISTORY & PHYSICAL   CHIEF COMPLAINT:  Intractable shortness of breath, cough.   HISTORY OF PRESENT ILLNESS:  [name] is a 60 year old lady who has  chronic asthma.  She, over the past four to six weeks, has been on several  antibiotic regimens for acute bronchitis, however, she continues to have  significant shortness of breath, especially dyspnea on exertion and a  productive cough.  She has pleuritic chest pain as a result of her  persistent cough.  She also has increased malaise, fatigue and weakness.  As  she has failed on persistent outpatient treatment for her asthma, she will  be admitted for further evaluation and treatment of her intractable asthma  exacerbation.   ALLERGIES:  CODEINE.   PAST MEDICAL HISTORY:  1. Type 2 diabetes mellitus.  2. Asthma.   MEDICATIONS:  1. Nexium 40 mg p.o. q.a.m. and h.s.  2. Lantus 70 units subq q.h.s.  3. Prednisone dose pack, which she is on her third day.  4. Singulair 10 mg p.o. q. day.  5. Levaquin 500 mg p.o. q. day.  6. Albuterol nebulizer q.i.d.   FAMILY HISTORY:  Significant for type 2 diabetes mellitus and hypertension.   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  She denies tobacco, alcohol or drugs of abuse.   REVIEW OF SYSTEMS:  As per HPI and patient history assessment.   PHYSICAL EXAMINATION:  GENERAL APPEARANCE: Well-developed, well-nourished,  black female, appearing tired and coughing paroxysmally with speaking.  VITAL SIGNS: Temperature 98.6, heart rate 97, respirations 22, blood  pressure 138/65.  HEENT: TMs within normal limits bilaterally.  No oropharyngeal lesions.  NECK: Supple.  No masses.  2+ carotids.  No bruits.  LUNGS: Scattered wheezes bilaterally.  Decreased breath sounds bilaterally.  HEART: S1 and S2, regular rate and rhythm.  There were no murmurs, rubs or  gallops.  ABDOMEN: Soft, nontender, nondistended.  Had positive bowel sounds.  EXTREMITIES: No clubbing, cyanosis or edema.  NEURO: Alert and oriented x3.  Cranial nerves intact.   ASSESSMENT/PLAN:  Acute asthma with bronchitis.  As mentioned earlier [name] has failed outpatient treatment.  She will admitted for IV  steroids, antibiotics, and nebulizers.  Chest x-ray obtained prior to  admission does reveal significant peribronchial thickening.  1. Type 2 diabetes mellitus.  [name] blood glucose has been poorly     controlled as a result of recent prednisone.  Her diabetic regimen will  be adjusted as she will be on IV steroids.                                                [name]. [name], M.D.    KRS/MEDQ  D:  11/17/2003  T:  11/17/2003  Job:  [id number]

## 2011-01-09 NOTE — Cardiovascular Report (Signed)
[facility]  Patient:    [name], [name]                      MRN: [id number] Proc. Date: 12/03/00 Adm. Date:  [id number] Attending:  Lyn Records. Iii CC:         Stephen A. Evlyn Kanner, M.D.  Cardiac Catheterization Lab   Cardiac Catheterization  INDICATION:  Refractory ischemic-quality chest discomfort in this patient who is diabetic and hypertensive.  The patient has a previous history of prolonged chest pain evaluated by Cardiolite study and cardiac catheterization in 1999. The catheterization at that time was normal.  On this occasion, she has had prolonged chest discomfort equivocally responsive to nitroglycerin and transient anterior T wave abnormalities.  This study is being done to rule out coronary artery disease as the source.  A CT scan of the chest has ruled out pulmonary emboli and aortic dissection.  PROCEDURES PERFORMED: 1. Left heart catheterization. 2. Selective coronary angioplasty. 3. Left ventriculography.  CARDIOLOGIST:  Darci Needle, M.D.  DESCRIPTION:  After informed consent, a 6-French sheath was inserted into the right femoral artery using the modified Seldinger technique.  A 6-French A2 multipurpose catheter was used for hemodynamic recordings, left ventriculography, and selective left and right coronary angiography.  The patient tolerated the procedure without complications.  RESULTS: I.   Hemodynamic data:      a. Aortic pressure 131/73 mmHg.      b. Left ventricular pressure 131/12 mmHg. II.  Left ventriculography:  The left ventricle is normal in size and      demonstrates normal overall contractility. III. Selective coronary angiography.      a. Left main coronary:  Normal.      b. Left anterior descending coronary: Large, wrapping around the left         ventricular apex, and entirely normal.      c. Ramus intermedius branch: Large and normal.      d. Circumflex artery: Normal.      e. Right coronary  artery: The right coronary contains perhaps 20%         ostial narrowing.  The vessel is essentially normal otherwise.  CONCLUSIONS: 1. Essentially normal coronary arteries. 2. Normal left ventricular function.  PLAN:  No further cardiac evaluation at this time.  We will review past history and evaluations to make sure that an echo has been done to rule out hypertrophic cardiomyopathy.  If the patient is ambulatory this evening, will allow her to be discharged from the hospital. DD:  12/03/00 TD:  12/03/00 Job: [id number]

## 2011-01-09 NOTE — H&P (Signed)
[name], [name]               ACCOUNT NO.:  [id number]   MEDICAL RECORD NO.:  [id number]          PATIENT TYPE:  INP   LOCATION:  6705                         FACILITY:  [facility]   PHYSICIAN:  Marcellus Scott, MD     DATE OF BIRTH:  04-Sep-1950   DATE OF ADMISSION:  11/03/2006  DATE OF DISCHARGE:                              HISTORY & PHYSICAL   PRIMARY CARE PHYSICIAN:  [name]. [name], M.D.   Cardiologist, [facility].  Ophthalmologist, Dr. Jacklynn Lewis.  Oncologist, Dorisann Frames, M.D.   CHIEF COMPLAINT:  Allergic reaction.   HISTORY OF PRESENT ILLNESS:  Ms. Lotter is a 60 year old, pleasant,  African-American female patient with past medical history as indicated  below.  Today, she had an ophthalmologist appointment at approximately 8  a.m.  While at the ophthalmologist's office, she had fluorescein dye  instilled in both the eyes followed by an IV injection in the right arm.  She does not remember the name of the IV medication; however, in a  couple of minutes, she started having sensation of her tongue swelling,  numbness of the tongue, sensation of her throat closing up, itching of  her upper and lower extremities and hives.  She was given oral dose of  Benadryl, however, her symptoms continued to worsen following which she  was given another dose of Benadryl.  The patient says that she was  monitored in the ophthalmologist's office up to noon following which she  was discharged on a prescription of prednisone.  The patient took two  tablets of the prednisone, unclear dose, at home, however, she continued  to have these symptoms.  In addition, she also had painful swelling of  the eye, dry cough, tightness of the chest and chest pressure.  With  these symptoms, the patient called the ophthalmologist's office and was  asked to come to the emergency room.  The patient presented to the  Urgent Care Facility where she got a dose of 1:1000 dilution of  epinephrine and IV Benadryl and she was transferred to the emergency  room.  At this time, the patient's eye swelling has almost subsided.  She still has the numbness and swelling sensation of the tongue,  soreness in the throat, tightness in the chest which is significantly  better, but still there.  She also has a dry cough.  Dyspnea is  significantly better.  The patient has no hives and minimal itching in  her eye.  The patient also an episode of nausea and vomiting prior to  coming to the emergency room.   PAST MEDICAL HISTORY:  1. Type 2 diabetes.  2. Thyroid cancer.  3. Hypertension.  4. Dyslipidemia.  5. Stress test done in January 2008, negative.  6. Asthma.   PAST SURGICAL HISTORY:  1. Status post thyroidectomy in 2006, for thyroid cancer, followed by      chemotherapy with no recurrence at this time.  2. Neck/cervical spine surgery.  3. Tonsillectomy.  4. Left knee surgery.  5. Left toe surgery.  6. Right-sided carpal tunnel syndrome surgery.   ALLERGIES:  CODEINE which  causes swelling.  FLUORESCEIN DYE current  admission.  UNKNOWN IV DRUG to be elicited from the ophthalmologist's  office in the a.m.   CURRENT MEDICATIONS:  1. Nexium one tablet p.o. b.i.d.  2. Lantus 70units to 100 units subcu nightly based on blood sugar      check.  3. Byetta 10 mcg subcu b.i.d.  4. Singulair 10 mg p.o. daily.  5. Synthroid 137 mcg p.o. daily.  6. Multivitamin one p.o. daily.  7. Vitamin B three tablets p.o. daily.  8. Aspirin 81 mg p.o. daily.  9. Ambien 5 mg p.o. nightly p.r.n.   FAMILY HISTORY:  The patient's mother is a diabetic.  The patient's  father died of lung cancer.   SOCIAL HISTORY:  The patient is single with no history of tobacco,  alcohol or drug use.  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   REVIEW OF SYSTEMS:  Over 10 systems reviewed and the only additional  symptom is headache and some generalized weakness.    PHYSICAL EXAMINATION:  GENERAL:  The patient is a young, moderately  obese female patient in no obvious cardiopulmonary or painful distress  at this time.  VITAL SIGNS:  Temperature 98.4 degrees Fahrenheit, blood pressure  145/73, pulse 120 per minute and regular, respirations 19 per minute  saturating at 98% on room air.  HEENT:  Atraumatic, normocephalic.  Pupils equal round and reactive to  light and accommodation.  Oral cavity with no tongue swelling, no  pharyngeal erythema.  NECK:  No JVD, carotid bruit, lymphadenopathy or goiter.  Supple.  LUNGS:  Clear to auscultation bilaterally with no wheezes, rhonchi or  crackles.  CARDIAC:  S1, S2 heard.  No S3, S4 or murmurs, rubs, gallops or clicks.  ABDOMEN:  Obese, midline laparotomy scar, nontender, no organomegaly or  mass.  Bowel sounds are present.  NEUROLOGIC:  The patient is awake, alert and oriented x3 with no focal  neurological deficits.  EXTREMITIES:  No clubbing, cyanosis or edema.  Peripheral pulses are  symmetrically felt.  SKIN:  Without any rashes.   LABORATORY DATA AND X-RAY FINDINGS:  No labs were drawn.   Chest x-ray has been reviewed by me and reported with bibasilar  atelectasis.  EKG with sinus tachycardia at 112 beats per minute, normal  axis.   ASSESSMENT/PLAN:  1. Allergic reaction/anaphylaxis to fluorescein dye and unknown      intravenous medication.  Will admit the patient to the stepdown      unit. To try and ascertain the intravenous drug in question from      the ophthalmologist's office in the a.m.Marland Kitchen To place two large bore      intravenous accesses.  I will place the patient on high-dose      steroids because of the persistent symptoms.  Will also provide      Benadryl p.r.n.  Continue with nebulizations p.r.n. and oxygen.  To      monitor the patient closely for airway compromise and provide      p.r.n. epinephrine. 2. Diabetes.  Will continue the patient on her home doses of Lantus      and  Byetta and also place the patient on a sliding scale insulin.  3. Hypothyroidism.  Will check the patient's TSH and continue the      patient's Synthroid.  4. Asthma which is stable at this time.  Will continue nebulizations,      oxygen and Singulair.  Marcellus Scott, MD  Electronically Signed     AH/MEDQ  D:  11/04/2006  T:  11/04/2006  Job:  [id number]   cc:   [name]. [name], M.D.  Alford Highland. Rankin, M.D.

## 2011-01-09 NOTE — Discharge Summary (Signed)
NAME:  [name], [name]                         ACCOUNT NO.:  [id number]   MEDICAL RECORD NO.:  [id number]                   PATIENT TYPE:  INP   LOCATION:  3715                                 FACILITY:  [facility]   PHYSICIAN:  Lyn Records, M.D.                DATE OF BIRTH:  16-Aug-1951   DATE OF ADMISSION:  10/31/2002  DATE OF DISCHARGE:  11/04/2002                                 DISCHARGE SUMMARY   ATTENDING PHYSICIAN:  Lyn Records, M.D.   PRIMARY CARE PHYSICIAN:  Jeannett Senior A. Evlyn Kanner, M.D.   IMPRESSION:  1. Chest pain of uncertain etiology.     A. Ruled out for a myocardial infarction by serial cardiac enzymes.     B. Two prior normal cardiac catheterizations for similar symptoms, most        recently 4/02.     C. A 2-D echocardiogram this admission was unremarkable.     D. Chest CT was negative for pulmonary emboli or aortic        pathology/infection.  Unchanged 1.5-cm right lobe thyroid nodule.     E. EGD this admission by Dr. Loreta Ave, revealing antral gastritis and a small        hiatal hernia.  2. Nausea/emesis.  3. Vertigo, improved with initiation of Antivert.  4. Diabetes mellitus:  Acceptable control.  5. Hypokalemia:  Serum potassium as low as 2.6; 3.2 prior to discharge.  Supplemented.  She was placed on potassium supplementation at discharge and  she will have a follow-up serum potassium level when she sees Dr. Evlyn Kanner as  an outpatient on 11/17/02.   GASTROENTEROLOGIST:  Dr. Elsie Amis.   PROCEDURES:  1. 2-D echocardiogram revealing normal LV size and function with an ejection     fraction of 55-65%.  Essentially normal valvular morphology.  2. Esophagogastroduodenoscopy by Dr. Loreta Ave revealing normal-appearing     esophagus, small hiatal hernia.  Mild antral gastritis.  Normal proximal     small bowel.  No ulcers.   PLAN:  1. The patient will be discharged home in stable condition.   DISCHARGE MEDICATIONS:  1. Antivert (meclizine) 25 mg 1-2 tablets p.o. q.6h.  p.r.n. for dizziness,     no more than four tablets a day.  2. Nexium twice daily.  3. Insulin, Lantus as before (if blood sugar is greater than 125, she takes     70 units at bedtime.  If blood sugar greater than 200, then 70 units that     evening.  4. Singulair 10 mg p.o. q.d.  5. (New) K-Dur 20 mEq p.o. daily - take with fluids.  6. Albuterol MDI p.r.n.  7. The patient noted to stop taking her Aleve tablets if this contributes to     gastric irritation.   DIET:  Diabetic diet.   SPECIAL INSTRUCTIONS:  None.   FOLLOW UP:  Dr. Vear Clock at the at pain clinic.  Dr. Macky Lower, Friday,  11/17/02 at 10 a.m. (phone number [phone]. Also lab workup in his office to  follow her potassium.   HISTORY OF PRESENT ILLNESS:  Ms. Scheier is a 60 year old female with prior  history of refractory chest pain for which she has had two prior cardiac  catheterizations (1999, 4/02) revealing normal coronary arteries and normal  LV function.   HOSPITAL COURSE:  The patient was complaining of severe anterior chest  pressure rated 10:10.  It was protracted and she presented to the emergency  room. She had some symptoms of nausea, emesis and dyspnea.  The patient was  started on IV heparin after a bolus.  She ruled out for a myocardial  infarction by serial cardiac enzymes.  Her heparin and nitrates were  discontinued and she was underwent a spiral chest CT which ruled her out for  a low-grade infection or other aortic pathology and was negative for  evidence of pulmonary emboli.  On 11/01/02, the patient had emesis,  approximately 2600 cc of yellow emesis.  Dr. Loreta Ave of gastroenterology was  consulted.  She ordered and performed an endoscopy, revealing normal  esophagus and proximal small bowel.  A small hiatal hernia, mild antral  gastritis.  Dr. Loreta Ave recommended continuation of her PPI and avoiding  NSAIDs.   The patient had a low serum potassium this admission which was supplemented.  She was  started on potassium at the time of discharge and will follow up  with serum potassium as outpatient in her followup at Dr. Rinaldo Cloud office.  A  2D echocardiogram was performed this admission and was entirely normal.   PAST MEDICAL HISTORY:  1. Chest pain of uncertain etiology:     A. Prior cardiac catheterizations in 1999 and again 4/02, revealing        normal coronary arteries.  2. Cholecystectomy.  3. Hysterectomy.  4. Right carpal tunnel release.  5. C3-C4 diskectomy.  6. Appendectomy.  7. Diabetes mellitus, type 2.  8. GERD.  9. COPD.  10.      Preadmission vertigo, she is taking p.r.n. Antivert.   LABORATORY AND ACCESSORY DATA:  Chest x-ray revealed no convincing evidence  of acute CA.  Chest CT 10/29/02 was negative for pulmonary emboli, no aortic  dissection, a 1-5 cm in size enhancing nodule associated with the right lobe  of the thyroid which is unchanged from prior study 12/02/00.   Laboratory tests today:  CBC:  WBC 6.7, hemoglobin 12.3, WBC 10.9 on  11/02/02.  Hematocrit 35.1 and platelets 198,000; essentially within normal  range.  Admission coags also within normal range.  Sodium 143, (K) potassium  3.5, chloride 107, glucose 146, BUN 8, creatinine 0.8.  Potassium was low at  2.6 an 2.2 at time of discharge.  Glucose ranging from 107 to 214.  Amylase and lipase were within the normal  range.  Serial cardiac enzymes were negative x3 sets.  Cholesterol was 158,  triglyceride 59, HDL 51, LDL 95.   EKG revealed sinus tach at 101 beats per minute without acute ischemic  changes.     Salomon Fick, N.P.                       Lyn Records, M.D.    MES/MEDQ  D:  12/11/2002  T:  12/12/2002  Job:  [id number]   cc:   Jeannett Senior A. Evlyn Kanner, M.D.  [address]  [place]  [place] [address]  Fax: [phone]

## 2011-01-09 NOTE — Op Note (Signed)
   [name], [name]                         ACCOUNT NO.:  [id number]   MEDICAL RECORD NO.:  [id number]                   PATIENT TYPE:  INP   LOCATION:  3715                                 FACILITY:  [facility]   PHYSICIAN:  Anselmo Rod, M.D.               DATE OF BIRTH:  1950/11/24   DATE OF PROCEDURE:  11/02/2002  DATE OF DISCHARGE:                                 OPERATIVE REPORT   PROCEDURE:  Esophagogastroduodenoscopy.   ENDOSCOPIST:  Anselmo Rod, M.D.   INSTRUMENT USED:  Olympus video panendoscope.   INDICATION FOR PROCEDURE:  Chest pain in a 60 year old African-American  female with a normal cardiac catheterization.  Rule out ulcer disease,  esophagitis, etc.   PREPROCEDURE PREPARATION:  Informed consent was procured from the patient.  The patient fasted for eight hours prior to the procedure, and heparin was  discontinued the night prior to the procedure.   PREPROCEDURE PHYSICAL:  VITAL SIGNS:  The patient had stable vital signs.  NECK:  Supple.  CHEST:  Clear to auscultation.  S1, S2 regular.  ABDOMEN:  Soft with normal bowel sounds.   DESCRIPTION OF PROCEDURE:  The patient was placed in the left lateral  decubitus position and sedated with 50 mg of Demerol and 5 mg of Versed  intravenously.  Once the patient was adequately sedate and maintained on low-  flow oxygen and continuous cardiac monitoring, the Olympus video  panendoscope was advanced through the mouthpiece, over the tongue, into the  esophagus under direct vision.  The entire esophagus appeared normal with no  evidence of ring, stricture, masses, esophagitis, or Barrett's mucosa.  The  stomach was then advanced into the stomach.  A small hiatal hernia was seen  on high retroflexion.  There was mild antral gastritis noted.  The rest of  the gastric mucosa and the proximal small bowel appeared normal.  No ulcers,  erosions, masses, or polyps were seen.   IMPRESSION:  1. Essentially unrevealing EGD  except for a small hiatal hernia and mild     antral gastritis.  2. Normal-appearing esophagus and proximal small bowel.  3. No ulcers or masses seen.    RECOMMENDATIONS:  1. Continue PPIs.  2. Avoid nonsteroidals.  3. Outpatient follow-up in the next two weeks for further recommendations.                                               Anselmo Rod, M.D.    JNM/MEDQ  D:  11/03/2002  T:  11/03/2002  Job:  [id number]   cc:   Lyn Records III, M.D.  [address]  [address]  [place]  [place] [address]  Fax: [phone]

## 2011-01-09 NOTE — Discharge Summary (Signed)
[name], [name]               ACCOUNT NO.:  [id number]   MEDICAL RECORD NO.:  [id number]          PATIENT TYPE:  INP   LOCATION:  6705                         FACILITY:  [facility]   PHYSICIAN:  Lonia Blood, M.D.       DATE OF BIRTH:  [birth year]   DATE OF ADMISSION:  11/03/2006  DATE OF DISCHARGE:  11/06/2006                               DISCHARGE SUMMARY   PRIMARY CARE PHYSICIAN:  Dr. Andi Devon.   DISCHARGE DIAGNOSES:  1. Allergic reaction to fluorescein.  2. Angioedema, resolved.  3. Asthma exacerbation, resolved.  4. Obesity.  5. Uncontrolled diabetes mellitus.  6. History of thyroid cancer, status post thyroidectomy.  7. Hypertension.  8. Hyperlipidemia.  9. Status post tonsillectomy.  10.Status post cervical spine surgery.  11.Gastroesophageal reflux disease.   DISCHARGE MEDICATIONS:  1. Nexium one tablet by mouth twice a day.  2. Lantus 70 units subcutaneously twice a day.  3. Byetta twice a day.  4. Singulair 10 mg daily.  5. Synthroid 137 mcg daily.  6. Multivitamin one tablet daily.  7. Aspirin 81 mg daily.  8. Prednisone taper.  9. Albuterol inhaler as needed.  10.Pepcid 40 mg daily.  11.Benadryl 12.5 mg three times a day.   CONDITION ON DISCHARGE:  [name] was discharged in good condition.  At the time of discharge, her vital signs were stable.  Her respiratory  status was good and she was in no acute or chronic distress.   FOLLOW UP:  The patient was instructed to follow up with her primary  care physician, Dr. [name], to assure complete resolution of her  symptoms.   ACTIVITY:  The patient was also instructed not to drive while she is  taking Benadryl and to take at least 1 week off from work until the  symptoms are completely resolved.   PROCEDURE:  None.   CONSULTATIONS:  No consultations were obtained.   HISTORY OF PRESENT ILLNESS:  For admission history and physical, refer  to dictated H&P done by Dr. Waymon Amato on November 04, 2006.   HOSPITAL COURSE:  Problem 1.  ALLERGIC REACTION TO FLUORESCEIN:  While  [name] was in Dr. Ephriam Knuckles office, she sustained severe  anaphylactic like reaction to injection of fluorescein.  I personally  called the office of Dr. Luciana Axe and made sure that she has not received  any other medication intravenously.  [name] symptoms responded  well in the office with treatment with Benadryl and Pepcid, but she had  recurrent symptoms most likely associated with cellular phase of the  allergic response.  She came back to the ER the same night and was  admitted to acute care unit at [facility] where she received  intravenous steroids as well as she was continued on Benadryl and  Pepcid.  Throughout this hospitalization, the patient's symptoms  continued to improve and her steroids were tapered to 60 mg of  prednisone daily.  The patient still had mild residual symptoms at the  time of discharge, but of no avail or life-threatening severity.  At  this point in time,  we felt the patient was safe to be discharged home  under the care of her primary care physician and to follow up with an  allergist as an outpatient.  [name] will complete the prednisone  taper, will complete taking Benadryl and Pepcid and for now, we would  advise no further use of fluorescein in this patient.   Problem 2.  UNCONTROLLED DIABETES MELLITUS:  [name] is on a  regimen of Lantus and Byetta and she has been continuously adjusting it  to obtain good control.  Further adjustments in the patient's insulin to  be done as an outpatient by Dr. [name].      Lonia Blood, M.D.  Electronically Signed     SL/MEDQ  D:  11/07/2006  T:  11/08/2006  Job:  [id number]   cc:   [name]. [name], M.D.

## 2011-01-09 NOTE — Op Note (Signed)
[name], [name]               ACCOUNT NO.:  [id number]   MEDICAL RECORD NO.:  [id number]          PATIENT TYPE:  AMB   LOCATION:  SDS                          FACILITY:  [facility]   PHYSICIAN:  Jefry H. Pollyann Kennedy, MD     DATE OF BIRTH:  1950-10-13   DATE OF PROCEDURE:  05/22/2005  DATE OF DISCHARGE:                                 OPERATIVE REPORT   PREOPERATIVE DIAGNOSIS:  Thyroid carcinoma.   POSTOPERATIVE DIAGNOSIS:  Thyroid carcinoma.   PROCEDURE:  Completion thyroidectomy, left side.   SURGEON:  Jefry H. Pollyann Kennedy, M.D.   ASSISTANT:  Antony Contras, M.D.  General endotracheal anesthesia was used.  No complications.   FINDINGS:  A healthy-appearing left thyroid lobe without any obvious  palpable masses.  No associated adenopathy.   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  A decision was made for a completion  thyroidectomy and order to undergo postoperative radioiodine scanning and  potential treatment.  The risks, benefits, alternatives and complications of  the procedure were explained to the patient, who seemed to understand and  agreed to surgery.   PROCEDURE:  The patient was take to the operating room and placed on the  operating table in the supine position.  Following induction of general endotracheal anesthesia, the neck was prepped  and draped in a standard fashion.  The electrocautery was used to incise the  skin at the original scar and was used to dissect down to the platysmal  layer.  A subplatysmal flap was developed superiorly to the thyroid notch  and inferiorly to the clavicle.  There was healthy granulation tissue  throughout all the surgical beds.  The midline fascia was identified and  retracted laterally to the left.  The remaining thyroid lobe was identified  and was retracted towards the right side using Babcock  and Allis forceps.  Dissection was then carried out starting superiorly and identifying,  ligating and dividing the superior vasculature.  A putative superior  parathyroid was identified and preserved with its blood supply.  Dissection  continued around the capsule of the gland, staying right on the capsule,  ligating the middle thyroid vein and the inferior vasculature as well.  An  inferior parathyroid was not identified.  The ligament of Allyson Sabal was divided.  The thyroid lobe was removed and sent for pathologic evaluation.  The  recurrent nerve was identified deep to the level of dissection.  The wound  was irrigated with saline and hemostasis was completed using silk sutures  and bipolar  cautery.  The wound was closed in layers using 3-0 chromic on the midline  fascia and the platysma and the subcutaneous layer.  A subcuticular closure  was created using a 4-0 Prolene suture.  Steri-Strips were applied on the  skin.  The patient was then awakened, extubated, and transferred to  recovery.      Jefry H. Pollyann Kennedy, MD  Electronically Signed     JHR/MEDQ  D:  05/22/2005  T:  05/22/2005  Job:  [id number]

## 2011-01-09 NOTE — Discharge Summary (Signed)
[place]  Patient:    [name], [name] Visit Number: [id number] MRN: [id number]          Service Type: Attending:  Ronaldo Miyamoto L. Franky Macho, M.D. Adm. Date:  05/25/01 Disc. 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  PROCEDURE:  Anterior cervical diskectomy and arthrodesis C3-4 with small stature Synthes plating.  COMPLICATIONS:  None.  CONDITION ON DISCHARGE:  Alive and well.  HISTORY OF PRESENT ILLNESS:  [name] is a 59 year old woman whom I have followed now for displaced disks at C3-4.  She had not had any evidence of myelopathy and she felt that she was too busy to have an operation.  The last visit she was myelopathic and we therefore had her come in for a decompression of the spinal cord.  Mr. Mcguinn at this time presented with weakness in the upper extremities and in her hands bilaterally.  HOSPITAL COURSE:  Postoperatively, she continues to have weakness about 4 to 4-/5 in the upper extremities.  However, all muscle groups are working.  She has no radicular signs.  She still has some residual numbness in her fingertips, but the other pain and numbness is improved.  Her wound is clean and dry without signs of infection.  She is being sent home with silicon gel pack which is being used to retard formation of a keloid.  I will see her back in the office in three to four weeks.  She was given instructions to remove the silicon gel pack, shower, and then place it back on with a piece of tape. No heavy lifting or driving for the next 10 days.  She is voiding, walking, and eating at discharge. Attending:  Mena Goes. Franky Macho, M.D. DD:  05/27/01 TD:  05/28/01 Job: [id number]

## 2011-01-09 NOTE — H&P (Signed)
[name]. [facility]  Patient:    [name], [name] Visit Number: [id number] MRN: [id number]          Service Type: SUR Location: 3000 3009 [id number] Attending Physician:  Coletta Memos Dictated by:   Mena Goes. Franky Macho, M.D. Admit Date:  05/25/2001                           History and Physical  ADMITTING DIAGNOSIS: Displaced disk, C3-4, with cervical myelopathy.  HISTORY OF PRESENT ILLNESS: [name] is a 60 year old woman, who initially presented to me in January 2002.  At that time she was having pain in the left upper extremity.  She had a history of three previous neck operations done at C6-7, C5-6, and at C4-5.  The last operation was done in 1999.  Prior to each operation she had upper extremity pain and numbness but after each operation she did quite well.  She started having problems again in July 2001 when she had a headache.  The headache have been unrelenting and were treated by a neurologist.  They were treated adequately but she still had neck and upper extremity pain.  She also has some numbness on the left side, so she presented.  She has had no bowel or bladder dysfunction.  She continues to have no bowel or bladder dysfunction.  An MRI which was done after that visit showed that she had a centrally herniated disk at C3-4.  There was some cord distortion but she was not myelopathic.  There is some question as to whether or not there was a pseudoarthrosis at C3-4 but there did not appear to be on follow-up CT scan.  Mr. [name] followed up with me again in April 2002.  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  She had 5/5 strength at that time, no Hoffmanns sign, and 2+ reflexes at the biceps, triceps, brachial radialis, knees, and ankles.  She continued to have some minor neck pain but it was something she could deal with.  She returned again to see me in September  2002 and at that time she was having numbness in her hands, which had been going on for about a month.  She had some headaches, which had improved with medication.  She had not had any weakness previously but she was weak at this visit.  SOCIAL HISTORY: She does not smoke, drink, or use illicit drugs.  PAST MEDICAL HISTORY: Significant for diabetes.  ALLERGIES: CODEINE.  MEDICATIONS:  1. Lantus 85 units at bedtime.  2. Humalog 20-25 units each meal for diabetes.  3. Prilosec 20 mg one tablet in the morning.  4. Ambien 10 mg.  5. Aleve as needed for headache.  FAMILY HISTORY: Mother age 24 and is in fair health, has diabetes.  Father died at age 49 from lung cancer.  Diabetes, heart disease, and hypertension present in the family history.  REVIEW OF SYSTEMS: Positive for eye glasses, cataracts, heart murmur, hypercholesterolemia, asthma, arm weakness, arm pain, neck pain, and diabetes.  PHYSICAL EXAMINATION:  VITAL SIGNS: Height 5 feet 4 inches.  Weight 170 pounds.  NEUROLOGIC: She has Hoffmanns sign on her right side, none on the left. Weakness in the upper extremities, approximately 4/5 intrinsics, and grip 4-/5.  Reflexes 2+ at the biceps, triceps, brachial radialis, knees, and ankles.  She has no clonus.  Toes  are downgoing to plantar stimulation. Pinprick intact in the upper and lower extremities.  Proprioception intact. CHEST: Lung fields clear.  She no dyspnea on examination.  NECK: No cervical masses or bruits.  Three well-healed scars on the right side of the neck.  HEART: No murmurs or rubs appreciated on examination.  EXTREMITIES: Pulses good at wrists and feet bilaterally.  LABORATORY DATA: [name] had a new MRI done in September 2002 after presenting with the new symptoms.  This showed significant cord compression, which has not been present on previous MRIs.  IMPRESSION/PLAN: [name] presents now with clear evidence of myelopathy and a large disk  herniation at C3-4.  This correlates with the symptomatic change that she reports.  I have recommended that she undergo this operation as soon as possible, and therefore she is being admitted today for anterior cervical diskectomy and arthrodesis at C3-4.  Having undergone this operation three times previously she was well versed on the risks and complications. However, we did discuss them - those being bleeding, infection, no pain relief, bowel or bladder dysfunction and need for further surgery, fusion failure, hardware failure, and recurrent laryngeal nerve injury causing hoarseness.  She understood and wishes to proceed. Dictated by:   Mena Goes. Franky Macho, M.D. Attending Physician:  Coletta Memos DD:  05/25/01 TD:  05/25/01 Job: [id number]

## 2011-01-09 NOTE — Procedures (Signed)
[place]. [facility]  Patient:    [name], [name]                      MRN: [id number] Proc. Date: 12/14/00 Adm. Date:  [id number] Attending:  Charna Elizabeth CC:         Tera Mater. Evlyn Kanner, M.D.  Darci Needle, M.D.   Procedure Report  DATE OF BIRTH:  Dec 04, 1950  REFERRING PHYSICIAN:  Tera Mater. Evlyn Kanner, M.D.  PROCEDURE PERFORMED:  Esophagogastroduodenoscopy with biopsies.  ENDOSCOPIST:  Anselmo Rod, M.D.  INSTRUMENT USED:  Olympus video panendoscope.  INDICATIONS FOR PROCEDURE: The patient is a 60 year old African-American female with a history of chest pain worse in the last two weeks with an extensive cardiac work-up that has been essentially revealing.  Rule out peptic ulcer disease, esophagitis, gastritis, etc.  PREPROCEDURE PREPARATION:  Informed consent was procured from the patient. The patient was fasted for eight hours prior to the procedure.  PREPROCEDURE PHYSICAL:  The patient had stable vital signs.  Neck supple. Chest clear to auscultation.  S1, S2 regular.  Abdomen soft with normal abdominal bowel sounds.  DESCRIPTION OF PROCEDURE:  The patient was placed in left lateral decubitus position and sedated with 50 mg of Demerol and 5 mg of Versed intravenously. Once the patient was adequately sedated and maintained on low-flow oxygen and continuous cardiac monitoring, the Olympus video panendoscope was advanced through the mouthpiece, over the tongue, into the esophagus under direct vision.  The entire esophagus appeared normal without evidence of ring stricture, masses, lesions, esophagitis or Barretts mucosa.  The Z-line appeared healthy.  The scope was then advanced into the stomach.  There was a small hiatal hernia seen on high retroflexion.  There were a few antral erosions with some antral gastritis.  Biopsies were done from this area to rule out presence of Helicobacter pylori by pathology.  The patient was retching during  the procedure and therefore, the postbulbar area could not be visualized adequately.  The duodenal bulb appeared healthy and without lesions.  The patient tolerated the procedure well without complications.  IMPRESSION: 1. Small hiatal hernia. 2. Multiple antral erosions. 3. No frank ulcers seen. 4. Postbulbar area not visualized because of patients retching during the    procedure.  RECOMMENDATION: 1. Continue proton pump inhibitors. 2. Avoid all nonsteroidals. 3. Treat with antibiotics if Helicobacter pylori present in biopsies. 4. Anxiolytic of choice. 5. Outpatient follow-up in the next two weeks. DD:  12/14/00 TD:  12/14/00 Job: [id number]
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
## 2011-01-09 NOTE — Discharge Summary (Signed)
NAMEGIDGET, QUIZHPI                         ACCOUNT NO.:  [id number]   MEDICAL RECORD NO.:  [id number]                   PATIENT TYPE:  INP   LOCATION:  5712                                 FACILITY:  [facility]   PHYSICIAN:  [name]. [name], M.D.           DATE OF BIRTH:  1951/07/26   DATE OF ADMISSION:  11/12/2003  DATE OF DISCHARGE:  11/17/2003                                 DISCHARGE SUMMARY   DISCHARGE DIAGNOSES:  1. Acute asthma exacerbation.  2. Acute bronchitis.  3. Type 2 diabetes mellitus.   HOSPITAL COURSE:  Ms. Tetro was admitted for intractable asthma symptoms  including shortness of breath, wheezing, and cough.  During the course of  this admission, she was placed on albuterol nebulizers, IV Solu-Medrol, as  well as IV antibiotics.  Her respiratory status slowly improved; however,  she continued to have significant cough and dyspnea on exertion.  Her  dyspnea on exertion improved at time of discharge, but, again, she continued  to have significant nonproductive cough.  O2 saturation at discharge were  between 94 and 96%.  Pre and post peak flows did improve somewhat with  prepeak flows between 175 and 190 and post peak flows between 270 and 390.  At discharge, her prepeak flow was 280 and post peak flow 390.   Ms. Kinnaird, although improved, is not significantly improved to return to  work at this time as she continues to have significant cough, especially  with exertion.  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  She cannot say several sentences without going into  coughing spells.   Further evaluation of her intractable asthma symptoms will be pursued as an  outpatient.  She may have a significant GERD component which may be  complicating her asthma.  Further EMT and GI evaluations will be pursued as  an outpatient.   DISCHARGE MEDICATIONS:  1. Albuterol nebulizer 2.5 mg four times a day  2. Singulair 10 mg p.o.  daily.  3. Tessalon Perles 200 mg p.o. t.i.d.  4. Inderal HD 1 teaspoon p.o. q.6h.  5. Humibid LA 600 mg p.o. b.i.d.  6. Levaquin 500 mg p.o. daily for 5 days.  7. Protonix 40 mg p.o. b.i.d.  8. Reglan 10 mg p.o. b.i.d.  9. Prednisone 12-day double-strength Dose-Pak.  10.      Pulmicort 0.25 mg four times a day.  11.      Zyrtec 10 mg p.o. daily.  12.      Ambien 5 mg p.o. q.h.s. p.r.n. for sleep.  13.      Lantus insulin 70 units subsequently q.a.m., 15 units subsequently     q.h.s.   FOLLOW UP:  Ms. Bamford will be seen by Dr. Andi Devon on Tuesday,  March 29, at 9 a.m.  [name]. [name], M.D.    KRS/MEDQ  D:  11/17/2003  T:  11/17/2003  Job:  [id number]

## 2011-05-22 LAB — POCT RAPID STREP A: Streptococcus, Group A Screen (Direct): NEGATIVE

## 2012-02-10 ENCOUNTER — Other Ambulatory Visit: Payer: Self-pay | Admitting: Internal Medicine

## 2012-02-10 DIAGNOSIS — J40 Bronchitis, not specified as acute or chronic: Secondary | ICD-10-CM

## 2012-02-10 DIAGNOSIS — IMO0002 Reserved for concepts with insufficient information to code with codable children: Secondary | ICD-10-CM

## 2012-02-11 ENCOUNTER — Ambulatory Visit
Admission: RE | Admit: 2012-02-11 | Discharge: 2012-02-11 | Disposition: A | Discharge status: Home / Self Care | Payer: 59 | Source: Ambulatory Visit | Attending: Internal Medicine | Admitting: Internal Medicine

## 2012-02-11 DIAGNOSIS — J40 Bronchitis, not specified as acute or chronic: Secondary | ICD-10-CM

## 2012-02-11 DIAGNOSIS — IMO0002 Reserved for concepts with insufficient information to code with codable children: Secondary | ICD-10-CM

## 2012-12-02 ENCOUNTER — Ambulatory Visit (INDEPENDENT_AMBULATORY_CARE_PROVIDER_SITE_OTHER): Payer: 59 | Admitting: Podiatry

## 2012-12-02 ENCOUNTER — Encounter: Payer: Self-pay | Admitting: Podiatry

## 2012-12-02 DIAGNOSIS — M2042 Other hammer toe(s) (acquired), left foot: Secondary | ICD-10-CM

## 2012-12-02 DIAGNOSIS — M25572 Pain in left ankle and joints of left foot: Secondary | ICD-10-CM

## 2012-12-02 DIAGNOSIS — L84 Corns and callosities: Secondary | ICD-10-CM

## 2012-12-02 DIAGNOSIS — M25579 Pain in unspecified ankle and joints of unspecified foot: Secondary | ICD-10-CM

## 2012-12-02 DIAGNOSIS — M2040 Other hammer toe(s) (acquired), unspecified foot: Secondary | ICD-10-CM

## 2012-12-02 DIAGNOSIS — E1149 Type 2 diabetes mellitus with other diabetic neurological complication: Secondary | ICD-10-CM

## 2012-12-02 DIAGNOSIS — E1142 Type 2 diabetes mellitus with diabetic polyneuropathy: Secondary | ICD-10-CM

## 2012-12-02 DIAGNOSIS — E1140 Type 2 diabetes mellitus with diabetic neuropathy, unspecified: Secondary | ICD-10-CM

## 2012-12-02 NOTE — Progress Notes (Signed)
SUBJECTIVE: 62 y.o. year old female presents for diabetic foot care. Patient is ambulatory without assistance. Patient is referred by Dr. [name].  Patient is type II IDDM. Stated that she has fluctuating blood sugar level, but it has much improved from what it used to be.   Her main concern today is a painful corn on 5th digit left foot that is hurting in any type of closed in shoes.  Also she is concerned with burning sensations on both feet especially at night. Voltaren gel helps some but not enough to give her satisfaction.  Reviewed Medication, Allergies, Medical and Surgical Histories.   REVIEW OF SYSTEMS: Constitutional: positive for malaise and tiredness from Myasthenia Gravis. Eyes: positive for Watery and drooping from Myasthenia Gravis. Ears, nose, mouth, throat, and face: negative Respiratory: negative Cardiovascular: negative Gastrointestinal: negative Genitourinary:negative Integument/breast: negative Hematologic/lymphatic: negative Musculoskeletal:Generalized tiredness, and mutiple digital contractures on lesser diitis bilateral.  OBJECTIVE: DERMATOLOGIC EXAMINATION: Nails: Mildly hypertrophic nails x 10. Skin Integrity: no secondary findings Corns and Calluses: Positive of digital corn symptomatic on left 5th digit, not symptomatic on right 5th digit.   VASCULAR EXAMINATION OF LOWER LIMBS: Pedal pulses: All pedal pulses are palpable with normal pulsation.  Dorsalis Pedis artery:  Posterior Tibial artery:  Capillary Filling times within 3 seconds in all digits.  Edema: negative noted in the lower limbs bilateral.  Ischemic changes negative on bilateral. Temperature gradient from tibial crest to dorsum of foot is within normal bilateral.  NEUROLOGIC EXAMINATION OF THE LOWER LIMBS: Achilles DTR is present and within normal. Monofilament (Semmes-Weinstein 10-gm) sensory testing positive 6 out of 6, bilateral. Vibratory sensations(128Hz  turning fork) intact at medial  and lateral forefoot bilateral.  Sharp and Dull discriminatory sensations at the plantar ball of hallux is intact bilateral.   MUSCULOSKELETAL EXAMINATION: Positive for mild Hallux valgus and bunion on both feet.  Contracted lesser digits 4th and 5th bilateral.  RADIOGRAPHIC STUDIES:  General overview: Negative of Osteopenia. Absence of soft tissue swelling. Absence of acute osseous or articular surfaces of forefoot or rearfoot.  AP View:  Significant for laterally deviated hallux at the Metatarsophalangeal joint with enlarged first metatarsal head at medial eminence L>R. Fibular sesamoid position is at 4 bilateral. Contracted 5th digit with enlarged phalangeal bone at proximal and middle phalangeal head on right. Positive for previous proximal phalangeal head resection with enlarged middle phalangeal bone on 5th left. Lateral view:  Positive for significant elevation of the first ray bilateral L>R. Mild increase in lateral deviation angle of the Calcaneocuboid lateral deviation angle bilateral.  Mild plantar calcaneal spur formation on left.  ASSESSMENT: 1. 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  2. Diabetic neuropathy.  PLAN: Debrided corns and all nails. Reviewed available treatment options. Discussed the importance of good blood sugar control to minimize complication such as Neuropathy.  AGCL Compound cream prescribed for burning feet (180mg  mixture of Amantadine 8%, Gabapentin 6%, Clonidine 0.2%, Lidocaine 5%).  Patient is seeking surgical correction of the problem toe 5th left. Pre-op consent form was reviewed for Hammer toe repair 5th left. Patient will call us when she is ready to have the procedure.

## 2012-12-02 NOTE — Patient Instructions (Addendum)
Seen for painful corn 5th left.  Trimmed corn today. Paper work completed for resection of bone from the 5th digit left. Will prescribe cream for burning foot pain at night. Apply as directed. Please call us when you know convenient time for 5th toe surgery.

## 2013-02-09 ENCOUNTER — Other Ambulatory Visit (HOSPITAL_COMMUNITY): Payer: Self-pay | Admitting: Endocrinology

## 2013-02-09 DIAGNOSIS — C73 Malignant neoplasm of thyroid gland: Secondary | ICD-10-CM

## 2013-02-27 ENCOUNTER — Encounter (HOSPITAL_COMMUNITY)
Admission: RE | Admit: 2013-02-27 | Discharge: 2013-02-27 | Disposition: A | Discharge status: Home / Self Care | Payer: 59 | Source: Ambulatory Visit | Attending: Endocrinology | Admitting: Endocrinology

## 2013-02-27 DIAGNOSIS — C73 Malignant neoplasm of thyroid gland: Secondary | ICD-10-CM

## 2013-02-27 MED ORDER — THYROTROPIN ALFA 1.1 MG IM SOLR
0.9000 mg | INTRAMUSCULAR | Status: AC
  Administered 2013-02-27: 0.9 mg via INTRAMUSCULAR
  Filled 2013-02-27: qty 0.9

## 2013-02-28 ENCOUNTER — Encounter (HOSPITAL_COMMUNITY)
Admission: RE | Admit: 2013-02-28 | Discharge: 2013-02-28 | Disposition: A | Discharge status: Home / Self Care | Payer: 59 | Source: Ambulatory Visit | Attending: Endocrinology | Admitting: Endocrinology

## 2013-02-28 DIAGNOSIS — C73 Malignant neoplasm of thyroid gland: Secondary | ICD-10-CM | POA: Insufficient documentation

## 2013-02-28 MED ORDER — THYROTROPIN ALFA 1.1 MG IM SOLR
0.9000 mg | INTRAMUSCULAR | Status: AC
  Administered 2013-02-28: 0.9 mg via INTRAMUSCULAR
  Filled 2013-02-28: qty 0.9

## 2013-03-01 ENCOUNTER — Encounter (HOSPITAL_COMMUNITY)
Admission: RE | Admit: 2013-03-01 | Discharge: 2013-03-01 | Disposition: A | Discharge status: Home / Self Care | Payer: 59 | Source: Ambulatory Visit | Attending: Endocrinology | Admitting: Endocrinology

## 2013-03-03 ENCOUNTER — Encounter (HOSPITAL_COMMUNITY): Payer: Self-pay

## 2013-03-03 ENCOUNTER — Encounter (HOSPITAL_COMMUNITY)
Admission: RE | Admit: 2013-03-03 | Discharge: 2013-03-03 | Disposition: A | Discharge status: Home / Self Care | Payer: 59 | Source: Ambulatory Visit | Attending: Endocrinology | Admitting: Endocrinology

## 2013-03-03 HISTORY — DX: Malignant neoplasm of thyroid gland: C73

## 2013-03-03 MED ORDER — SODIUM IODIDE I 131 CAPSULE
4.2000 | Freq: Once | INTRAVENOUS | Status: AC | PRN
  Administered 2013-03-03: 4.2 via ORAL

## 2013-03-13 ENCOUNTER — Observation Stay (HOSPITAL_BASED_OUTPATIENT_CLINIC_OR_DEPARTMENT_OTHER)
Admission: EM | Admit: 2013-03-13 | Discharge: 2013-03-16 | Disposition: A | Discharge status: Home / Self Care | Payer: 59 | Attending: Internal Medicine | Admitting: Internal Medicine

## 2013-03-13 ENCOUNTER — Emergency Department (HOSPITAL_BASED_OUTPATIENT_CLINIC_OR_DEPARTMENT_OTHER): Payer: 59

## 2013-03-13 ENCOUNTER — Encounter (HOSPITAL_BASED_OUTPATIENT_CLINIC_OR_DEPARTMENT_OTHER): Payer: Self-pay | Admitting: Family Medicine

## 2013-03-13 DIAGNOSIS — Z85850 Personal history of malignant neoplasm of thyroid: Secondary | ICD-10-CM | POA: Insufficient documentation

## 2013-03-13 DIAGNOSIS — E039 Hypothyroidism, unspecified: Secondary | ICD-10-CM

## 2013-03-13 DIAGNOSIS — E119 Type 2 diabetes mellitus without complications: Secondary | ICD-10-CM | POA: Insufficient documentation

## 2013-03-13 DIAGNOSIS — R079 Chest pain, unspecified: Principal | ICD-10-CM | POA: Insufficient documentation

## 2013-03-13 DIAGNOSIS — E785 Hyperlipidemia, unspecified: Secondary | ICD-10-CM | POA: Insufficient documentation

## 2013-03-13 DIAGNOSIS — G7000 Myasthenia gravis without (acute) exacerbation: Secondary | ICD-10-CM | POA: Insufficient documentation

## 2013-03-13 DIAGNOSIS — R197 Diarrhea, unspecified: Secondary | ICD-10-CM | POA: Insufficient documentation

## 2013-03-13 DIAGNOSIS — R112 Nausea with vomiting, unspecified: Secondary | ICD-10-CM | POA: Insufficient documentation

## 2013-03-13 DIAGNOSIS — E876 Hypokalemia: Secondary | ICD-10-CM | POA: Insufficient documentation

## 2013-03-13 DIAGNOSIS — J45909 Unspecified asthma, uncomplicated: Secondary | ICD-10-CM | POA: Diagnosis present

## 2013-03-13 DIAGNOSIS — Z79899 Other long term (current) drug therapy: Secondary | ICD-10-CM | POA: Insufficient documentation

## 2013-03-13 DIAGNOSIS — Z794 Long term (current) use of insulin: Secondary | ICD-10-CM | POA: Insufficient documentation

## 2013-03-13 DIAGNOSIS — I10 Essential (primary) hypertension: Secondary | ICD-10-CM

## 2013-03-13 DIAGNOSIS — E890 Postprocedural hypothyroidism: Secondary | ICD-10-CM | POA: Insufficient documentation

## 2013-03-13 HISTORY — DX: Sleep apnea, unspecified: G47.30

## 2013-03-13 HISTORY — DX: Unspecified asthma, uncomplicated: J45.909

## 2013-03-13 HISTORY — DX: Unspecified osteoarthritis, unspecified site: M19.90

## 2013-03-13 HISTORY — DX: Pure hypercholesterolemia, unspecified: E78.00

## 2013-03-13 HISTORY — DX: Shortness of breath: R06.02

## 2013-03-13 HISTORY — DX: Hypothyroidism, unspecified: E03.9

## 2013-03-13 HISTORY — DX: Cardiac murmur, unspecified: R01.1

## 2013-03-13 HISTORY — DX: Personal history of other diseases of the digestive system: Z87.19

## 2013-03-13 HISTORY — DX: Essential (primary) hypertension: I10

## 2013-03-13 HISTORY — DX: Gastro-esophageal reflux disease without esophagitis: K21.9

## 2013-03-13 HISTORY — DX: Type 2 diabetes mellitus without complications: E11.9

## 2013-03-13 HISTORY — DX: Myasthenia gravis without (acute) exacerbation: G70.00

## 2013-03-13 HISTORY — DX: Chest pain, unspecified: R07.9

## 2013-03-13 LAB — CBC WITH DIFFERENTIAL/PLATELET
Basophils Relative: 1 % (ref 0–1)
Eosinophils Relative: 1 % (ref 0–5)
Hemoglobin: 11.8 g/dL — ABNORMAL LOW (ref 12.0–15.0)
Lymphocytes Relative: 19 % (ref 12–46)
MCH: 29.9 pg (ref 26.0–34.0)
Monocytes Absolute: 0.6 10*3/uL (ref 0.1–1.0)
Monocytes Relative: 7 % (ref 3–12)
Neutrophils Relative %: 72 % (ref 43–77)
RBC: 3.94 MIL/uL (ref 3.87–5.11)

## 2013-03-13 LAB — COMPREHENSIVE METABOLIC PANEL
ALT: 21 U/L (ref 0–35)
Alkaline Phosphatase: 111 U/L (ref 39–117)
CO2: 26 mEq/L (ref 19–32)
Chloride: 102 mEq/L (ref 96–112)
GFR calc Af Amer: >90 mL/min (ref >90)
GFR calc non Af Amer: >90 mL/min (ref >90)
Glucose, Bld: 200 mg/dL — ABNORMAL HIGH (ref 70–99)
Potassium: 3 mEq/L — ABNORMAL LOW (ref 3.5–5.1)
Sodium: 140 mEq/L (ref 135–145)
Total Bilirubin: 0.3 mg/dL (ref 0.3–1.2)

## 2013-03-13 LAB — GLUCOSE, CAPILLARY

## 2013-03-13 LAB — TROPONIN I: Troponin I: <0.3 ng/mL (ref <0.30)

## 2013-03-13 MED ORDER — ZOLPIDEM TARTRATE 5 MG PO TABS
5.0000 mg | ORAL_TABLET | Freq: Every evening | ORAL | Status: DC | PRN
  Administered 2013-03-13 – 2013-03-15 (×2): 5 mg via ORAL
  Filled 2013-03-13 (×2): qty 1

## 2013-03-13 MED ORDER — ASPIRIN 81 MG PO CHEW
324.0000 mg | CHEWABLE_TABLET | Freq: Once | ORAL | Status: AC
  Administered 2013-03-13: 324 mg via ORAL
  Filled 2013-03-13: qty 4

## 2013-03-13 MED ORDER — MORPHINE SULFATE 4 MG/ML IJ SOLN
4.0000 mg | Freq: Once | INTRAMUSCULAR | Status: AC
  Administered 2013-03-13: 4 mg via INTRAVENOUS
  Filled 2013-03-13: qty 1

## 2013-03-13 MED ORDER — SODIUM CHLORIDE 0.9 % IV BOLUS (SEPSIS)
1000.0000 mL | Freq: Once | INTRAVENOUS | Status: AC
  Administered 2013-03-13: 1000 mL via INTRAVENOUS

## 2013-03-13 MED ORDER — PREDNISOLONE 5 MG PO TABS
10.0000 mg | ORAL_TABLET | Freq: Two times a day (BID) | ORAL | Status: DC
  Filled 2013-03-13: qty 2

## 2013-03-13 MED ORDER — ACETAMINOPHEN 325 MG PO TABS: 650.0000 mg | ORAL_TABLET | Freq: Four times a day (QID) | ORAL | Status: DC | PRN

## 2013-03-13 MED ORDER — ASPIRIN 81 MG PO CHEW
CHEWABLE_TABLET | ORAL | Status: AC
  Administered 2013-03-13: 11:00:00
  Filled 2013-03-13: qty 1

## 2013-03-13 MED ORDER — CYCLOSPORINE 0.05 % OP EMUL
1.0000 [drp] | Freq: Two times a day (BID) | OPHTHALMIC | Status: DC
  Administered 2013-03-13 – 2013-03-16 (×6): 1 [drp] via OPHTHALMIC
  Filled 2013-03-13 (×7): qty 1

## 2013-03-13 MED ORDER — INSULIN ASPART 100 UNIT/ML ~~LOC~~ SOLN
0.0000 [IU] | Freq: Three times a day (TID) | SUBCUTANEOUS | Status: DC
  Administered 2013-03-14: 5 [IU] via SUBCUTANEOUS
  Administered 2013-03-14: 3 [IU] via SUBCUTANEOUS
  Administered 2013-03-16: 5 [IU] via SUBCUTANEOUS
  Administered 2013-03-16: 2 [IU] via SUBCUTANEOUS

## 2013-03-13 MED ORDER — MYCOPHENOLATE MOFETIL 250 MG PO CAPS
1500.0000 mg | ORAL_CAPSULE | Freq: Every day | ORAL | Status: DC
  Administered 2013-03-14 – 2013-03-16 (×3): 1500 mg via ORAL
  Filled 2013-03-13 (×3): qty 6

## 2013-03-13 MED ORDER — PANTOPRAZOLE SODIUM 40 MG PO TBEC
40.0000 mg | DELAYED_RELEASE_TABLET | Freq: Two times a day (BID) | ORAL | Status: DC
  Administered 2013-03-13 – 2013-03-16 (×6): 40 mg via ORAL
  Filled 2013-03-13 (×6): qty 1

## 2013-03-13 MED ORDER — INSULIN GLARGINE 100 UNIT/ML ~~LOC~~ SOLN
70.0000 [IU] | Freq: Two times a day (BID) | SUBCUTANEOUS | Status: DC
Start: 2013-03-13 — End: 2013-03-16
  Administered 2013-03-13 – 2013-03-16 (×5): 70 [IU] via SUBCUTANEOUS
  Filled 2013-03-13 (×7): qty 0.7

## 2013-03-13 MED ORDER — OXYCODONE-ACETAMINOPHEN 5-325 MG PO TABS
1.0000 | ORAL_TABLET | ORAL | Status: DC | PRN
  Administered 2013-03-14 (×2): 1 via ORAL
  Filled 2013-03-13 (×3): qty 1

## 2013-03-13 MED ORDER — POTASSIUM CHLORIDE CRYS ER 20 MEQ PO TBCR
20.0000 meq | EXTENDED_RELEASE_TABLET | Freq: Every day | ORAL | Status: DC
  Administered 2013-03-13 – 2013-03-16 (×4): 20 meq via ORAL
  Filled 2013-03-13 (×5): qty 1

## 2013-03-13 MED ORDER — MYCOPHENOLATE MOFETIL 250 MG PO CAPS
500.0000 mg | ORAL_CAPSULE | Freq: Two times a day (BID) | ORAL | Status: DC
  Filled 2013-03-13: qty 2

## 2013-03-13 MED ORDER — ONDANSETRON HCL 4 MG/2ML IJ SOLN
4.0000 mg | Freq: Four times a day (QID) | INTRAMUSCULAR | Status: DC | PRN
  Administered 2013-03-14: 4 mg via INTRAVENOUS
  Filled 2013-03-13: qty 2

## 2013-03-13 MED ORDER — MYCOPHENOLATE MOFETIL 500 MG PO TABS: 500.0000 mg | ORAL_TABLET | Freq: Two times a day (BID) | ORAL | Status: DC

## 2013-03-13 MED ORDER — POTASSIUM CHLORIDE 10 MEQ/100ML IV SOLN
10.0000 meq | INTRAVENOUS | Status: AC
  Administered 2013-03-13 (×3): 10 meq via INTRAVENOUS
  Filled 2013-03-13: qty 200
  Filled 2013-03-13: qty 100

## 2013-03-13 MED ORDER — ASPIRIN 81 MG PO CHEW
324.0000 mg | CHEWABLE_TABLET | Freq: Every day | ORAL | Status: DC
  Administered 2013-03-14 – 2013-03-16 (×3): 324 mg via ORAL
  Filled 2013-03-13 (×2): qty 4
  Filled 2013-03-13: qty 3
  Filled 2013-03-13: qty 1

## 2013-03-13 MED ORDER — ONDANSETRON HCL 4 MG/2ML IJ SOLN: 4.0000 mg | Freq: Three times a day (TID) | INTRAMUSCULAR | Status: DC | PRN

## 2013-03-13 MED ORDER — ENOXAPARIN SODIUM 30 MG/0.3ML ~~LOC~~ SOLN
30.0000 mg | SUBCUTANEOUS | Status: DC
  Administered 2013-03-13: 30 mg via SUBCUTANEOUS
  Filled 2013-03-13 (×2): qty 0.3

## 2013-03-13 MED ORDER — MYCOPHENOLATE MOFETIL 250 MG PO CAPS
1000.0000 mg | ORAL_CAPSULE | Freq: Every day | ORAL | Status: DC
  Administered 2013-03-13 – 2013-03-14 (×2): 1000 mg via ORAL
  Filled 2013-03-13 (×4): qty 4

## 2013-03-13 MED ORDER — ONDANSETRON HCL 4 MG/2ML IJ SOLN
4.0000 mg | Freq: Once | INTRAMUSCULAR | Status: AC
  Administered 2013-03-13: 4 mg via INTRAVENOUS
  Filled 2013-03-13: qty 2

## 2013-03-13 MED ORDER — FA-PYRIDOXINE-CYANOCOBALAMIN 2.5-25-2 MG PO TABS
1.0000 | ORAL_TABLET | Freq: Every day | ORAL | Status: DC
  Administered 2013-03-14 – 2013-03-16 (×3): 1 via ORAL
  Filled 2013-03-13 (×3): qty 1

## 2013-03-13 MED ORDER — LEVOTHYROXINE SODIUM 175 MCG PO TABS
175.0000 ug | ORAL_TABLET | Freq: Every day | ORAL | Status: DC
Start: 2013-03-14 — End: 2013-03-14
  Filled 2013-03-13 (×2): qty 1

## 2013-03-13 MED ORDER — ATORVASTATIN CALCIUM 10 MG PO TABS
10.0000 mg | ORAL_TABLET | Freq: Every day | ORAL | Status: DC
  Administered 2013-03-13 – 2013-03-16 (×4): 10 mg via ORAL
  Filled 2013-03-13 (×4): qty 1

## 2013-03-13 MED ORDER — PREDNISONE 10 MG PO TABS
10.0000 mg | ORAL_TABLET | Freq: Two times a day (BID) | ORAL | Status: DC
  Administered 2013-03-13 – 2013-03-16 (×5): 10 mg via ORAL
  Filled 2013-03-13 (×8): qty 1

## 2013-03-13 MED ORDER — NITROGLYCERIN 0.4 MG SL SUBL
0.4000 mg | SUBLINGUAL_TABLET | SUBLINGUAL | Status: DC | PRN
  Administered 2013-03-13 (×2): 0.4 mg via SUBLINGUAL
  Filled 2013-03-13: qty 25

## 2013-03-13 MED ORDER — MONTELUKAST SODIUM 10 MG PO TABS
10.0000 mg | ORAL_TABLET | Freq: Every day | ORAL | Status: DC
  Administered 2013-03-13 – 2013-03-15 (×3): 10 mg via ORAL
  Filled 2013-03-13 (×4): qty 1

## 2013-03-13 NOTE — ED Provider Notes (Signed)
History    CSN: [id number] Arrival date & time 03/13/13  1005  First MD Initiated Contact with Patient 03/13/13 1012     Chief Complaint  Patient presents with  . Chest Pain   (Consider location/radiation/quality/duration/timing/severity/associated sxs/prior Treatment) HPI Comments: Patient presents with central chest pain it started around 6 AM has been constant. It radiates to her back and her left arm and jaw. Is associated with nausea and 2 episodes of vomiting. She also had 3 episodes of diarrhea. Denies any abdominal pain, fever, cough, shortness of breath. She denies any cardiac history and has never had pain like this before. Denies having stents in heart. Nothing makes the pain better or worse. She denies any abdominal pain or low back pain. She has a history of diabetes, hypertension and myasthenia gravis.  The history is provided by the patient.   Past Medical History  Diagnosis Date  . Corns and callosities   . Difficulty in walking(719.7)   . Hypertension   . Myasthenia gravis   . High cholesterol   . Heart murmur   . Chest pain at rest     "woke me up in my sleep" (03/13/2013)  . Asthma   . Shortness of breath     "can happen at anytime" (03/13/2013)  . Sleep apnea     "use to wear mask; cause of insurance purposes I'm not not" (03/13/2013)  . Hypothyroidism   . Type II diabetes mellitus   . H/O hiatal hernia   . GERD (gastroesophageal reflux disease)   . Arthritis     "all over my body" (03/13/2013)  . Thyroid carcinoma   . Myasthenia gravis     "in my eyes; diagnsosed > 7 yr ago" (03/13/2013)   Past Surgical History  Procedure Laterality Date  . Total thyroidectomy    . Tonsillectomy    . Abdominal hysterectomy    . Appendectomy    . Cholecystectomy    . Carpal tunnel release Right   . Knee arthroscopy Left   . Cataract extraction w/ intraocular lens  implant, bilateral Bilateral   . Cardiac catheterization      "several" (03/13/2013)  . Anterior  cervical decomp/discectomy fusion      "I've had severa ORs; always went in from the front" (03/13/2013)  . Shoulder arthroscopy w/ rotator cuff repair Left    Family History  Problem Relation Age of Onset  . Coronary artery disease Sister     s/p coronary stenting  . Congestive Heart Failure Mother   . Congestive Heart Failure Father   . Lung cancer Father    History  Substance Use Topics  . Smoking status: Never Smoker   . Smokeless tobacco: Never Used  . Alcohol Use: No   OB History   Grav Para Term Preterm Abortions TAB SAB Ect Mult Living                 Review of Systems  Constitutional: Negative for fever, activity change and appetite change.  HENT: Negative for congestion and rhinorrhea.   Respiratory: Positive for chest tightness.   Cardiovascular: Positive for chest pain.  Gastrointestinal: Positive for nausea, vomiting and diarrhea. Negative for abdominal pain.  Genitourinary: Negative for vaginal bleeding and vaginal discharge.  Musculoskeletal: Negative for back pain.  Skin: Negative for rash.  Neurological: Negative for dizziness, weakness and headaches.  A complete 10 system review of systems was obtained and all systems are negative except as noted in the HPI and  PMH.    Allergies  Contrast media and Codeine  Home Medications   No current outpatient prescriptions on file. BP 137/56  Pulse 99  Temp(Src) 98.6 F (37 C) (Oral)  Resp 18  Ht 5\' 3"  (1.6 m)  Wt 170 lb (77.111 kg)  BMI 30.12 kg/m2  SpO2 97% Physical Exam  Constitutional: She is oriented to person, place, and time. She appears well-developed and well-nourished. No distress.  HENT:  Head: Normocephalic and atraumatic.  Mouth/Throat: Oropharynx is clear and moist. No oropharyngeal exudate.  Eyes: Conjunctivae and EOM are normal. Pupils are equal, round, and reactive to light.  Neck: Normal range of motion. Neck supple.  Cardiovascular: Normal rate, regular rhythm, normal heart sounds and  intact distal pulses.   Tachycardic Equal DP, PT, femoral pulses  Pulmonary/Chest: Effort normal and breath sounds normal. No respiratory distress.  Abdominal: Soft. There is no tenderness. There is no rebound and no guarding.  Musculoskeletal: Normal range of motion. She exhibits no edema and no tenderness.  Neurological: She is alert and oriented to person, place, and time. No cranial nerve deficit. She exhibits normal muscle tone. Coordination normal.  5/5 strength throughout  Skin: Skin is warm.    ED Course  Procedures (including critical care time) Labs Reviewed  CBC WITH DIFFERENTIAL - Abnormal; Notable for the following:    Hemoglobin 11.8 (*)    HCT 34.5 (*)    All other components within normal limits  COMPREHENSIVE METABOLIC PANEL - Abnormal; Notable for the following:    Potassium 3.0 (*)    Glucose, Bld 200 (*)    All other components within normal limits  GLUCOSE, CAPILLARY - Abnormal; Notable for the following:    Glucose-Capillary 200 (*)    All other components within normal limits  CBC - Abnormal; Notable for the following:    RBC 3.39 (*)    Hemoglobin 10.0 (*)    HCT 29.2 (*)    All other components within normal limits  BASIC METABOLIC PANEL - Abnormal; Notable for the following:    Potassium 3.2 (*)    Glucose, Bld 198 (*)    Creatinine, Ser 0.49 (*)    All other components within normal limits  GLUCOSE, CAPILLARY - Abnormal; Notable for the following:    Glucose-Capillary 222 (*)    All other components within normal limits  GLUCOSE, CAPILLARY - Abnormal; Notable for the following:    Glucose-Capillary 182 (*)    All other components within normal limits  GLUCOSE, CAPILLARY - Abnormal; Notable for the following:    Glucose-Capillary 199 (*)    All other components within normal limits  TROPONIN I  D-DIMER, QUANTITATIVE  TROPONIN I  TROPONIN I  TROPONIN I  TROPONIN I  TSH   Ct Chest Wo Contrast  03/13/2013   *RADIOLOGY REPORT*  Clinical Data:  Chest pain, diaphoresis, nausea, jaw pain, shortness of breath, history diabetes, thyroid cancer, hypertension, myasthenia gravis  CT CHEST WITHOUT CONTRAST  Technique:  Multidetector CT imaging of the chest was performed following the standard protocol without IV contrast. Sagittal and coronal MPR images reconstructed from axial data set.  Comparison: Chest radiograph 02/11/2012  Findings: Scattered atherosclerotic calcifications aorta. No definite thoracic adenopathy. Surgical clips right upper quadrant question cholecystectomy. Visualized portion of upper abdomen otherwise unremarkable. Dependent atelectasis in both lower lobes. No acute infiltrate, pleural effusion or pneumothorax. No acute osseous findings.  IMPRESSION: Dependent bibasilar atelectasis. Otherwise negative noncontrast CT chest.   Original Report Authenticated By: Loraine Leriche  Tyron Russell, M.D.   1. Chest pain   2. Nausea and vomiting   3. Diabetes mellitus   4. Essential hypertension, benign   5. Myasthenia gravis   6. Unspecified hypothyroidism     MDM  Left-sided chest pain that radiates to the jaw arm associated with nausea and vomiting. Sinus tachycardia with nonspecific T wave changes, similar to previous  D-dimer negative.  Patient has allergy to IV contrast. BP R arm 176/65, L 163/61. Less than 20 point differential in BPs. No neuro deficits, equal pulses.  Aortic dissection considered, but no neuro or pulse deficits. D-dimer and troponin negative. No mediastinal widening.  Unable to obtain CTA due to contrast allergy.  D/w Dr. Manson Passey radiology who suggest TEE or MRA of chest to r/o dissection.   Pain is somewhat reproducible but patient has several cardiac risk factors with ongoing nausea and tachycardia.  ADmission d./w Dr. Elvera Lennox.   Date: 03/13/2013  Rate: 110  Rhythm: sinus tachycardia  QRS Axis: normal  Intervals: normal  ST/T Wave abnormalities: nonspecific ST/T changes  Conduction Disutrbances:none  Narrative  Interpretation:   Old EKG Reviewed: unchanged           Glynn Octave, MD 03/14/13 1313

## 2013-03-13 NOTE — ED Notes (Signed)
Pt declines pain medication.  

## 2013-03-13 NOTE — ED Notes (Signed)
[facility] at bedside preparing for transport 

## 2013-03-13 NOTE — ED Notes (Signed)
Pt informed of admission and plan of care.

## 2013-03-13 NOTE — ED Notes (Signed)
MD at bedside. 

## 2013-03-13 NOTE — ED Notes (Signed)
Pt c/o central chest pain radiating down left arm and into jaw since this morning. Pt also c/o n/v/d that started after cp. Nothing improves or worsens pain.

## 2013-03-13 NOTE — H&P (Addendum)
[facility] History and Physical  MICHIAH MASSE JYN:[id number] DOB: [birth year] DOA: 03/13/2013  Referring physician: EDP PCP: Alva Garnet., MD  Specialists: Dr.Solomon with [facility] previously  Chief Complaint: chest pain  HPI: [name] is a 62 y.o. female with H/o Myasthenia, DM on Insulin, HTN, Dyslipidemia, h/o thyroid cancer was in her usual state of health till this am. She woke up with L sided chest pain this am, this is described as pressure like, it radiated up her chest and down to her L arm and caused some transient tightness and discomfort in her jaw. In addition she also had an episode of vomiting and 3 episodes of diarrhea following this, no hematemesis/melena/hematochezia. No cough, congestion, fevers or chills. Upon evaluation in ER, she had a EKG, cardiac enzymes and negative d-dimer, the EDP was concerned about Aortic Dissection and due to contrast allergy only a non contrast Ct chest was done which was benign. In addition, some of this pain is reproducible with palpation of her L chest wall.   Review of Systems: The patient denies anorexia, fever, weight loss,, vision loss, decreased hearing, hoarseness, chest pain, syncope, dyspnea on exertion, peripheral edema, balance deficits, hemoptysis, abdominal pain, melena, hematochezia, severe indigestion/heartburn, hematuria, incontinence, genital sores, muscle weakness, suspicious skin lesions, transient blindness, difficulty walking, depression, unusual weight change, abnormal bleeding, enlarged lymph nodes, angioedema, and breast masses.    Past Medical History  Diagnosis Date  . Diabetes mellitus without complication   . Corns and callosities   . Difficulty in walking(719.7)   . Thyroid carcinoma   . Hypertension   . Myasthenia gravis    Past Surgical History  Procedure Laterality Date  . Total thyroidectomy    . Tonsillectomy    . Abdominal hysterectomy    . Eye surgery    . Cardiac catheterization      Social History:  reports that she has never smoked. She does not have any smokeless tobacco history on file. She reports that she does not drink alcohol or use illicit drugs. Lives at home by herself, independent in ADLs  Allergies  Allergen Reactions  . Contrast Media (Iodinated Diagnostic Agents) Anaphylaxis  . Codeine     family history  H/o CAD in mother and, CHF and lung cancer in father  Prior to Admission medications   Medication Sig Start Date End Date Taking? Authorizing Provider  atorvastatin (LIPITOR) 10 MG tablet Take 10 mg by mouth daily.    Historical Provider, MD  cycloSPORINE (RESTASIS) 0.05 % ophthalmic emulsion Place 1 drop into both eyes 2 (two) times daily.    Historical Provider, MD  diclofenac sodium (VOLTAREN) 1 % GEL Apply 2 g topically 2 (two) times daily.    Historical Provider, MD  esomeprazole (NEXIUM) 40 MG capsule Take 40 mg by mouth 2 (two) times daily.    Historical Provider, MD  folic acid-pyridoxine-cyancobalamin (FOLTX) 2.5-25-2 MG TABS Take 1 tablet by mouth daily.    Historical Provider, MD  glucose blood test strip 1 each by Other route as needed for other. Use as instructed    Historical Provider, MD  ibuprofen (ADVIL,MOTRIN) 800 MG tablet Take 800 mg by mouth every 8 (eight) hours as needed for pain.    Historical Provider, MD  insulin glargine (LANTUS) 100 UNIT/ML injection Inject 80 Units into the skin 2 (two) times daily.    Historical Provider, MD  insulin lispro (HUMALOG) 100 UNIT/ML injection Inject 100 Units into the skin 3 (three) times daily before meals.  Historical Provider, MD  levothyroxine (SYNTHROID, LEVOTHROID) 175 MCG tablet Take 175 mcg by mouth daily before breakfast.    Historical Provider, MD  meloxicam (MOBIC) 15 MG tablet Take 15 mg by mouth daily.    Historical Provider, MD  metFORMIN (GLUCOPHAGE) 500 MG tablet Take 500 mg by mouth 2 (two) times daily with a meal.    Historical Provider, MD  montelukast (SINGULAIR) 10  MG tablet Take 10 mg by mouth at bedtime.    Historical Provider, MD  mycophenolate (CELLCEPT) 500 MG tablet Take 500 mg by mouth.    Historical Provider, MD  olmesartan (BENICAR) 20 MG tablet Take 20 mg by mouth daily.    Historical Provider, MD  omega-3 acid ethyl esters (LOVAZA) 1 G capsule Take 1 g by mouth daily.    Historical Provider, MD  potassium chloride SA (K-DUR,KLOR-CON) 20 MEQ tablet Take 20 mEq by mouth daily.    Historical Provider, MD  prednisoLONE 5 MG TABS Take 5 mg by mouth 2 (two) times daily.    Historical Provider, MD  scopolamine (TRANSDERM-SCOP) 1.5 MG Place 1 patch onto the skin every 3 (three) days.    Historical Provider, MD   Physical Exam: Filed Vitals:   03/13/13 1344 03/13/13 1519 03/13/13 1522 03/13/13 1733  BP: 162/62 156/69 156/69 175/71  Pulse: 110 101 108 101  Temp:      TempSrc:      Resp: 16 14 16 16   Height:      Weight:      SpO2: 100% 100% 100% 99%     General: AAOx3, no distress  HEENT: Ptosis noted, PERRLA, no JVD  Chest: tenderness to palpation on L anterior costo-chondral margin  Cardiovascular: S1S2/RRR, no m/r/g  Respiratory: CTAB, diminished at bases  Abdomen: soft, obese, Nt, ND, BS present  Skin: no rashes  Musculoskeletal:no edema c/c  Psychiatric: appropriate mood and affect  Neurologic: ptosis, otherwise, nonfocal  Labs on Admission:  Basic Metabolic Panel:  Recent Labs Lab 03/13/13 1100  NA 140  K 3.0*  CL 102  CO2 26  GLUCOSE 200*  BUN 9  CREATININE 0.50  CALCIUM 10.2   Liver Function Tests:  Recent Labs Lab 03/13/13 1100  AST 18  ALT 21  ALKPHOS 111  BILITOT 0.3  PROT 7.1  ALBUMIN 3.7   No results found for this basename: LIPASE, AMYLASE,  in the last 168 hours No results found for this basename: AMMONIA,  in the last 168 hours CBC:  Recent Labs Lab 03/13/13 1100  WBC 8.6  NEUTROABS 6.2  HGB 11.8*  HCT 34.5*  MCV 87.6  PLT 225   Cardiac Enzymes:  Recent Labs Lab  03/13/13 1100 03/13/13 1620  TROPONINI <0.30 <0.30    BNP (last 3 results) No results found for this basename: PROBNP,  in the last 8760 hours CBG:  Recent Labs Lab 03/13/13 1517  GLUCAP 200*    Radiological Exams on Admission: Ct Chest Wo Contrast  03/13/2013   *RADIOLOGY REPORT*  Clinical Data: Chest pain, diaphoresis, nausea, jaw pain, shortness of breath, history diabetes, thyroid cancer, hypertension, myasthenia gravis  CT CHEST WITHOUT CONTRAST  Technique:  Multidetector CT imaging of the chest was performed following the standard protocol without IV contrast. Sagittal and coronal MPR images reconstructed from axial data set.  Comparison: Chest radiograph 02/11/2012  Findings: Scattered atherosclerotic calcifications aorta. No definite thoracic adenopathy. Surgical clips right upper quadrant question cholecystectomy. Visualized portion of upper abdomen otherwise unremarkable. Dependent atelectasis in both  lower lobes. No acute infiltrate, pleural effusion or pneumothorax. No acute osseous findings.  IMPRESSION: Dependent bibasilar atelectasis. Otherwise negative noncontrast CT chest.   Original Report Authenticated By: Ulyses Southward, M.D.    EKG: Independently reviewed. Sinus tachycardia, no acute St T wave changes  Assessment/Plan Active Problems:   Chest pain   Diabetes mellitus   Myasthenia gravis   Essential hypertension, benign   Asthma, chronic   1. Chest pain with some typical and atypical features - Likely musculoskeletal, given reproducibility of pain -normal D-dimer - but has a lot of risk factors for CAD -cycle cardiac enzymes, check 2D ECHO -monitor on tele -I clinically do not suspect aortic dissection based on reproducibility of pain, no back pain, no widened mediastinum on Ct and CXR. -BP differential in both arms <62mmHg, it was 155/80 on R and 150/70 on L [facility] cardiology consult tomorrow, used to see Dr.Solomon till 1-2years ago.  2. DM:  -continue  Lantus, SSI -hold metformin  3. H/o Myasthenia Gravis: -followed by a Neurologist at [facility] -continue cellcept and prednisone, will double dose of prednisone   4. Dyslipidemia -continue statin  5. Hypothyroidism -continue synthroid  DVT proph: lovenox  Code Status: FULL Family Communication: d/w pt and sister at bedside Disposition Plan: admit to Tele  Time spent:  [facility] Pager [phone]  If 7PM-7AM, please contact night-coverage [email or web address] Password [facility] 03/13/2013, 6:32 PM

## 2013-03-13 NOTE — ED Notes (Signed)
Attempted to call reports and RN unable to accept report.  Report called to [facility].

## 2013-03-14 ENCOUNTER — Encounter (HOSPITAL_COMMUNITY): Payer: Self-pay | Admitting: Cardiology

## 2013-03-14 DIAGNOSIS — E039 Hypothyroidism, unspecified: Secondary | ICD-10-CM

## 2013-03-14 DIAGNOSIS — R079 Chest pain, unspecified: Secondary | ICD-10-CM

## 2013-03-14 DIAGNOSIS — I519 Heart disease, unspecified: Secondary | ICD-10-CM

## 2013-03-14 DIAGNOSIS — I10 Essential (primary) hypertension: Secondary | ICD-10-CM

## 2013-03-14 DIAGNOSIS — R112 Nausea with vomiting, unspecified: Secondary | ICD-10-CM

## 2013-03-14 LAB — BASIC METABOLIC PANEL
BUN: 6 mg/dL (ref 6–23)
CO2: 26 mEq/L (ref 19–32)
Chloride: 108 mEq/L (ref 96–112)
Glucose, Bld: 198 mg/dL — ABNORMAL HIGH (ref 70–99)
Potassium: 3.2 mEq/L — ABNORMAL LOW (ref 3.5–5.1)
Sodium: 141 mEq/L (ref 135–145)

## 2013-03-14 LAB — GLUCOSE, CAPILLARY
Glucose-Capillary: 182 mg/dL — ABNORMAL HIGH (ref 70–99)
Glucose-Capillary: 278 mg/dL — ABNORMAL HIGH (ref 70–99)

## 2013-03-14 LAB — TROPONIN I
Troponin I: <0.3 ng/mL (ref <0.30)
Troponin I: <0.3 ng/mL (ref <0.30)

## 2013-03-14 LAB — TSH: TSH: 0.058 u[IU]/mL — ABNORMAL LOW (ref 0.350–4.500)

## 2013-03-14 LAB — CBC
HCT: 29.2 % — ABNORMAL LOW (ref 36.0–46.0)
Hemoglobin: 10 g/dL — ABNORMAL LOW (ref 12.0–15.0)
RBC: 3.39 MIL/uL — ABNORMAL LOW (ref 3.87–5.11)

## 2013-03-14 MED ORDER — ENOXAPARIN SODIUM 40 MG/0.4ML ~~LOC~~ SOLN
40.0000 mg | SUBCUTANEOUS | Status: DC
  Administered 2013-03-14 – 2013-03-15 (×2): 40 mg via SUBCUTANEOUS
  Filled 2013-03-14 (×3): qty 0.4

## 2013-03-14 MED ORDER — KETOROLAC TROMETHAMINE 30 MG/ML IJ SOLN
30.0000 mg | Freq: Once | INTRAMUSCULAR | Status: AC
  Administered 2013-03-14: 30 mg via INTRAVENOUS

## 2013-03-14 MED ORDER — REGADENOSON 0.4 MG/5ML IV SOLN
0.4000 mg | Freq: Once | INTRAVENOUS | Status: AC
  Administered 2013-03-15: 0.4 mg via INTRAVENOUS
  Filled 2013-03-14: qty 5

## 2013-03-14 MED ORDER — NAPROXEN 250 MG PO TABS
250.0000 mg | ORAL_TABLET | Freq: Two times a day (BID) | ORAL | Status: DC | PRN
  Filled 2013-03-14: qty 1

## 2013-03-14 MED ORDER — LEVOTHYROXINE SODIUM 200 MCG PO TABS: 200.0000 ug | ORAL_TABLET | Freq: Every day | ORAL | Status: DC

## 2013-03-14 NOTE — Progress Notes (Signed)
[facility] PROGRESS NOTE  [name] VWU:[id number] DOB: 1950-08-31 DOA: 03/13/2013 PCP: Alva Garnet., MD  HPI: [name] is a 62 y.o. female with H/o Myasthenia, DM on Insulin, HTN, Dyslipidemia, h/o thyroid cancer was in her usual state of health till this am. She woke up with L sided chest pain this am, this is described as pressure like, it radiated up her chest and down to her L arm and caused some transient tightness and discomfort in her jaw. In addition she also had an episode of vomiting and 3 episodes of diarrhea following this, no hematemesis/melena/hematochezia. No cough, congestion, fevers or chills. Upon evaluation in ER, she had a EKG, cardiac enzymes and negative d-dimer, the EDP was concerned about Aortic Dissection and due to contrast allergy only a non contrast Ct chest was done which was benign. In addition, some of this pain is reproducible with palpation of her L chest wall.  Assessment/Plan: Chest pain with some typical and atypical features - Likely musculoskeletal, given reproducibility of pain. Cardiology consulted and given risks will proceed with inpatient stress test 7/23. Normal D-dimer  - cycle cardiac enzymes, check 2D ECHO  - monitor on tele  - clinically do not suspect aortic dissection based on reproducibility of pain, no back pain, no widened mediastinum on Ct and CXR.   Hypothyroidism - she is tachycardic; synthroid dose was increased 2 months ago. Has diarrhea and mild tremors.  - check TSH, holding Synthroid until results are back; restart synthroid based on TSH. She is now on 200 mcg which was increased from 175 mcg two months ago.   DM:  -continue Lantus, SSI  -hold metformin  H/o Myasthenia Gravis:  -followed by a Neurologist at [facility]  -continue cellcept and prednisone, will double dose of prednisone   Dyslipidemia  -continue statin   DVT proph: lovenox   Code Status: presumed full Family Communication: none   Disposition Plan: stress tomorrow  Consultants:  Cardiology  Procedures:  none  Antibiotics:  Anti-infectives   None     Antibiotics Given (last 72 hours)   None     HPI/Subjective: - still has chest pain; sleeping comfortably initially when I entered her room.   Objective: Filed Vitals:   03/13/13 1815 03/13/13 1816 03/13/13 2100 03/14/13 0500  BP: 154/62 150/68 162/65 137/56  Pulse: 99 101 107 99  Temp:   98.5 F (36.9 C) 98.6 F (37 C)  TempSrc:   Oral Oral  Resp:   18 18  Height:      Weight:      SpO2:   99% 97%    Intake/Output Summary (Last 24 hours) at 03/14/13 1032 Last data filed at 03/13/13 1549  Gross per 24 hour  Intake   1100 ml  Output      0 ml  Net   1100 ml   Filed Weights   03/13/13 1019  Weight: 77.111 kg (170 lb)    Exam:   General:  NAD  Cardiovascular: regular rate and rhythm, without MRG  Respiratory: good air movement, clear to auscultation throughout, no wheezing, ronchi or rales  Abdomen: soft, not tender to palpation, positive bowel sounds  MSK: no peripheral edema  Neuro: CN 2-12 grossly intact, MS 5/5 in all 4  Data Reviewed: Basic Metabolic Panel:  Recent Labs Lab 03/13/13 1100 03/14/13 0405  NA 140 141  K 3.0* 3.2*  CL 102 108  CO2 26 26  GLUCOSE 200* 198*  BUN 9 6  CREATININE 0.50 0.49*  CALCIUM 10.2 9.0   Liver Function Tests:  Recent Labs Lab 03/13/13 1100  AST 18  ALT 21  ALKPHOS 111  BILITOT 0.3  PROT 7.1  ALBUMIN 3.7   CBC:  Recent Labs Lab 03/13/13 1100 03/14/13 0405  WBC 8.6 6.2  NEUTROABS 6.2  --   HGB 11.8* 10.0*  HCT 34.5* 29.2*  MCV 87.6 86.1  PLT 225 196   Cardiac Enzymes:  Recent Labs Lab 03/13/13 1100 03/13/13 1620 03/13/13 1913 03/14/13 0120  TROPONINI <0.30 <0.30 <0.30 <0.30   CBG:  Recent Labs Lab 03/13/13 1517 03/13/13 2121 03/14/13 0749  GLUCAP 200* 222* 182*   Studies: Ct Chest Wo Contrast  03/13/2013   *RADIOLOGY REPORT*  Clinical Data:  Chest pain, diaphoresis, nausea, jaw pain, shortness of breath, history diabetes, thyroid cancer, hypertension, myasthenia gravis  CT CHEST WITHOUT CONTRAST  Technique:  Multidetector CT imaging of the chest was performed following the standard protocol without IV contrast. Sagittal and coronal MPR images reconstructed from axial data set.  Comparison: Chest radiograph 02/11/2012  Findings: Scattered atherosclerotic calcifications aorta. No definite thoracic adenopathy. Surgical clips right upper quadrant question cholecystectomy. Visualized portion of upper abdomen otherwise unremarkable. Dependent atelectasis in both lower lobes. No acute infiltrate, pleural effusion or pneumothorax. No acute osseous findings.  IMPRESSION: Dependent bibasilar atelectasis. Otherwise negative noncontrast CT chest.   Original Report Authenticated By: Ulyses Southward, M.D.    Scheduled Meds: . aspirin  324 mg Oral Daily  . atorvastatin  10 mg Oral Daily  . cycloSPORINE  1 drop Both Eyes BID  . enoxaparin (LOVENOX) injection  30 mg Subcutaneous Q24H  . folic acid-pyridoxine-cyancobalamin  1 tablet Oral Daily  . insulin aspart  0-15 Units Subcutaneous TID WC  . insulin glargine  70 Units Subcutaneous BID  . montelukast  10 mg Oral QHS  . mycophenolate  1,000 mg Oral QHS  . mycophenolate  1,500 mg Oral Daily  . pantoprazole  40 mg Oral BID  . potassium chloride SA  20 mEq Oral Daily  . predniSONE  10 mg Oral BID WC  . [START ON 03/15/2013] regadenoson  0.4 mg Intravenous Once   Continuous Infusions:   Active Problems:   Chest pain   Diabetes mellitus   Myasthenia gravis   Essential hypertension, benign   Asthma, chronic  Time spent: 31  Pamella Pert, MD [facility] Pager [phone]. If 7 PM - 7 AM, please contact night-coverage at [email or web address], password [facility] 03/14/2013, 10:32 AM  LOS: 1 day

## 2013-03-14 NOTE — Consult Note (Signed)
Reason for Consult: Chest Pain Referring Physician: TRH   HPI: The patient is a 62 y/o AAF, previously followed at [facility] by Dr. Lynnea Ferrier. She has not been seen in follow-up since 2011. She has a past medical history significant for DM, HTN, HLD, Myasthenia Gravis and thyroid cancer, s/p thyroidectomy. She also has a family history of heart disease.  Her sister has CAD and underwent coronary stenting x 1. Both parents had CHF. The patient underwent a diagnostic LHC in 2012, in the setting of ongoing chest pain. The procedure was actually performed by Dr. Verdis Prime, of [facility], and demonstrated normal coronaries and normal LV function. Since then, she states that she has had occasional chest discomfort, which she has attributed to indigestion, as it has typically been relieved with antacids. However, she presented to the [facility] ER on 03/13/13 with a complaint of a different type of chest pain that she has never experienced before. The pain started at rest yesterday morning and has been constant. It is characterized as a sharp pain in the center of her chest, that radiates to the back and down the left upper extremity. She had associated nausea, plus emesis x 2. She later developed mild diarrhea. No SOB or diaphoresis. The pain is not pleuritic and is not exacerbated by movement. It is reproducible with palpation of the chest wall. She tried SL NTG x 3 in the ER, with no relief. The pain was moderately improved with morphine.  In the ER, her EKG was unremarkable. D-dimer was negative. A CT of the chest w/o contrast was unremarkable for aortic dissection. No other abnormalities were noted. Cardiac enzymes were cycled x 4 and were negative. Today she continues to endorse left sided chest pain. No other symptoms.   Past Medical History  Diagnosis Date  . Corns and callosities   . Difficulty in walking(719.7)   . Hypertension   . Myasthenia gravis   . High cholesterol   . Heart murmur   . Chest pain at  rest     "woke me up in my sleep" (03/13/2013)  . Asthma   . Shortness of breath     "can happen at anytime" (03/13/2013)  . Sleep apnea     "use to wear mask; cause of insurance purposes I'm not not" (03/13/2013)  . Hypothyroidism   . Type II diabetes mellitus   . H/O hiatal hernia   . GERD (gastroesophageal reflux disease)   . Arthritis     "all over my body" (03/13/2013)  . Thyroid carcinoma   . Myasthenia gravis     "in my eyes; diagnsosed > 7 yr ago" (03/13/2013)    Past Surgical History  Procedure Laterality Date  . Total thyroidectomy    . Tonsillectomy    . Abdominal hysterectomy    . Appendectomy    . Cholecystectomy    . Carpal tunnel release Right   . Knee arthroscopy Left   . Cataract extraction w/ intraocular lens  implant, bilateral Bilateral   . Cardiac catheterization      "several" (03/13/2013)  . Anterior cervical decomp/discectomy fusion      "I've had severa ORs; always went in from the front" (03/13/2013)  . Shoulder arthroscopy w/ rotator cuff repair Left     Family History  Problem Relation Age of Onset  . Coronary artery disease Sister     s/p coronary stenting  . Congestive Heart Failure Mother   . Congestive Heart Failure Father   . Lung  cancer Father     Social History:  reports that she has never smoked. She has never used smokeless tobacco. She reports that she does not drink alcohol or use illicit drugs.  Allergies:  Allergies  Allergen Reactions  . Contrast Media (Iodinated Diagnostic Agents) Anaphylaxis  . Codeine     Medications:  Prior to Admission:  Prescriptions prior to admission  Medication Sig Dispense Refill  . atorvastatin (LIPITOR) 10 MG tablet Take 10 mg by mouth daily.      . cycloSPORINE (RESTASIS) 0.05 % ophthalmic emulsion Place 1 drop into both eyes 2 (two) times daily.      . diclofenac sodium (VOLTAREN) 1 % GEL Apply 2 g topically 2 (two) times daily.      Marland Kitchen esomeprazole (NEXIUM) 40 MG capsule Take 40 mg by mouth 2  (two) times daily.      . folic acid-pyridoxine-cyancobalamin (FOLTX) 2.5-25-2 MG TABS Take 1 tablet by mouth daily.      Marland Kitchen ibuprofen (ADVIL,MOTRIN) 800 MG tablet Take 800 mg by mouth every 8 (eight) hours as needed for pain.      Marland Kitchen insulin glargine (LANTUS) 100 UNIT/ML injection Inject 80 Units into the skin 2 (two) times daily.      . insulin lispro (HUMALOG) 100 UNIT/ML injection Inject 100 Units into the skin 3 (three) times daily before meals.      Marland Kitchen levothyroxine (SYNTHROID, LEVOTHROID) 175 MCG tablet Take 175 mcg by mouth daily before breakfast.      . meloxicam (MOBIC) 15 MG tablet Take 15 mg by mouth daily.      . montelukast (SINGULAIR) 10 MG tablet Take 10 mg by mouth at bedtime.      . mycophenolate (CELLCEPT) 500 MG tablet Take 500 mg by mouth See admin instructions. Patient states she takes 3 tabs in the AM and 2 tab in the PM      . olmesartan (BENICAR) 20 MG tablet Take 20 mg by mouth daily.      . potassium chloride SA (K-DUR,KLOR-CON) 20 MEQ tablet Take 20 mEq by mouth daily.      . predniSONE (DELTASONE) 10 MG tablet Take 10 mg by mouth 2 (two) times daily.      Marland Kitchen scopolamine (TRANSDERM-SCOP) 1.5 MG Place 1 patch onto the skin every 3 (three) days.        Results for orders placed during the hospital encounter of 03/13/13 (from the past 48 hour(s))  CBC WITH DIFFERENTIAL     Status: Abnormal   Collection Time    03/13/13 11:00 AM      Result Value Range   WBC 8.6  4.0 - 10.5 K/uL   RBC 3.94  3.87 - 5.11 MIL/uL   Hemoglobin 11.8 (*) 12.0 - 15.0 g/dL   HCT 16.1 (*) 09.6 - 04.5 %   MCV 87.6  78.0 - 100.0 fL   MCH 29.9  26.0 - 34.0 pg   MCHC 34.2  30.0 - 36.0 g/dL   RDW 40.9  81.1 - 91.4 %   Platelets 225  150 - 400 K/uL   Neutrophils Relative % 72  43 - 77 %   Lymphocytes Relative 19  12 - 46 %   Monocytes Relative 7  3 - 12 %   Eosinophils Relative 1  0 - 5 %   Basophils Relative 1  0 - 1 %   Neutro Abs 6.2  1.7 - 7.7 K/uL   Lymphs Abs 1.6  0.7 - 4.0  K/uL    Monocytes Absolute 0.6  0.1 - 1.0 K/uL   Eosinophils Absolute 0.1  0.0 - 0.7 K/uL   Basophils Absolute 0.1  0.0 - 0.1 K/uL   Smear Review MORPHOLOGY UNREMARKABLE    COMPREHENSIVE METABOLIC PANEL     Status: Abnormal   Collection Time    03/13/13 11:00 AM      Result Value Range   Sodium 140  135 - 145 mEq/L   Potassium 3.0 (*) 3.5 - 5.1 mEq/L   Chloride 102  96 - 112 mEq/L   CO2 26  19 - 32 mEq/L   Glucose, Bld 200 (*) 70 - 99 mg/dL   BUN 9  6 - 23 mg/dL   Creatinine, Ser 1.61  0.50 - 1.10 mg/dL   Calcium 09.6  8.4 - 04.5 mg/dL   Total Protein 7.1  6.0 - 8.3 g/dL   Albumin 3.7  3.5 - 5.2 g/dL   AST 18  0 - 37 U/L   ALT 21  0 - 35 U/L   Alkaline Phosphatase 111  39 - 117 U/L   Total Bilirubin 0.3  0.3 - 1.2 mg/dL   GFR calc non Af Amer >90  >90 mL/min   GFR calc Af Amer >90  >90 mL/min   Comment:            The eGFR has been calculated     using the CKD EPI equation.     This calculation has not been     validated in all clinical     situations.     eGFR's persistently     <90 mL/min signify     possible Chronic Kidney Disease.  TROPONIN I     Status: None   Collection Time    03/13/13 11:00 AM      Result Value Range   Troponin I <0.30  <0.30 ng/mL   Comment:            Due to the release kinetics of cTnI,     a negative result within the first hours     of the onset of symptoms does not rule out     myocardial infarction with certainty.     If myocardial infarction is still suspected,     repeat the test at appropriate intervals.  D-DIMER, QUANTITATIVE     Status: None   Collection Time    03/13/13 11:00 AM      Result Value Range   D-Dimer, Quant 0.33  0.00 - 0.48 ug/mL-FEU   Comment:            AT THE INHOUSE ESTABLISHED CUTOFF     VALUE OF 0.48 ug/mL FEU,     THIS ASSAY HAS BEEN DOCUMENTED     IN THE LITERATURE TO HAVE     A SENSITIVITY AND NEGATIVE     PREDICTIVE VALUE OF AT LEAST     98 TO 99%.  THE TEST RESULT     SHOULD BE CORRELATED WITH     AN  ASSESSMENT OF THE CLINICAL     PROBABILITY OF DVT / VTE.  GLUCOSE, CAPILLARY     Status: Abnormal   Collection Time    03/13/13  3:17 PM      Result Value Range   Glucose-Capillary 200 (*) 70 - 99 mg/dL  TROPONIN I     Status: None   Collection Time    03/13/13  4:20 PM      Result  Value Range   Troponin I <0.30  <0.30 ng/mL   Comment:            Due to the release kinetics of cTnI,     a negative result within the first hours     of the onset of symptoms does not rule out     myocardial infarction with certainty.     If myocardial infarction is still suspected,     repeat the test at appropriate intervals.  TROPONIN I     Status: None   Collection Time    03/13/13  7:13 PM      Result Value Range   Troponin I <0.30  <0.30 ng/mL   Comment:            Due to the release kinetics of cTnI,     a negative result within the first hours     of the onset of symptoms does not rule out     myocardial infarction with certainty.     If myocardial infarction is still suspected,     repeat the test at appropriate intervals.  GLUCOSE, CAPILLARY     Status: Abnormal   Collection Time    03/13/13  9:21 PM      Result Value Range   Glucose-Capillary 222 (*) 70 - 99 mg/dL   Comment 1 Notify RN    TROPONIN I     Status: None   Collection Time    03/14/13  1:20 AM      Result Value Range   Troponin I <0.30  <0.30 ng/mL   Comment:            Due to the release kinetics of cTnI,     a negative result within the first hours     of the onset of symptoms does not rule out     myocardial infarction with certainty.     If myocardial infarction is still suspected,     repeat the test at appropriate intervals.  CBC     Status: Abnormal   Collection Time    03/14/13  4:05 AM      Result Value Range   WBC 6.2  4.0 - 10.5 K/uL   RBC 3.39 (*) 3.87 - 5.11 MIL/uL   Hemoglobin 10.0 (*) 12.0 - 15.0 g/dL   HCT 16.1 (*) 09.6 - 04.5 %   MCV 86.1  78.0 - 100.0 fL   MCH 29.5  26.0 - 34.0 pg   MCHC  34.2  30.0 - 36.0 g/dL   RDW 40.9  81.1 - 91.4 %   Platelets 196  150 - 400 K/uL  BASIC METABOLIC PANEL     Status: Abnormal   Collection Time    03/14/13  4:05 AM      Result Value Range   Sodium 141  135 - 145 mEq/L   Potassium 3.2 (*) 3.5 - 5.1 mEq/L   Chloride 108  96 - 112 mEq/L   CO2 26  19 - 32 mEq/L   Glucose, Bld 198 (*) 70 - 99 mg/dL   BUN 6  6 - 23 mg/dL   Creatinine, Ser 7.82 (*) 0.50 - 1.10 mg/dL   Calcium 9.0  8.4 - 95.6 mg/dL   GFR calc non Af Amer >90  >90 mL/min   GFR calc Af Amer >90  >90 mL/min   Comment:            The eGFR has been calculated  using the CKD EPI equation.     This calculation has not been     validated in all clinical     situations.     eGFR's persistently     <90 mL/min signify     possible Chronic Kidney Disease.  GLUCOSE, CAPILLARY     Status: Abnormal   Collection Time    03/14/13  7:49 AM      Result Value Range   Glucose-Capillary 182 (*) 70 - 99 mg/dL   Comment 1 Documented in Chart     Comment 2 Notify RN      Ct Chest Wo Contrast  03/13/2013   *RADIOLOGY REPORT*  Clinical Data: Chest pain, diaphoresis, nausea, jaw pain, shortness of breath, history diabetes, thyroid cancer, hypertension, myasthenia gravis  CT CHEST WITHOUT CONTRAST  Technique:  Multidetector CT imaging of the chest was performed following the standard protocol without IV contrast. Sagittal and coronal MPR images reconstructed from axial data set.  Comparison: Chest radiograph 02/11/2012  Findings: Scattered atherosclerotic calcifications aorta. No definite thoracic adenopathy. Surgical clips right upper quadrant question cholecystectomy. Visualized portion of upper abdomen otherwise unremarkable. Dependent atelectasis in both lower lobes. No acute infiltrate, pleural effusion or pneumothorax. No acute osseous findings.  IMPRESSION: Dependent bibasilar atelectasis. Otherwise negative noncontrast CT chest.   Original Report Authenticated By: Ulyses Southward, M.D.     Review of Systems  Respiratory: Negative for shortness of breath.   Cardiovascular: Positive for chest pain.  Gastrointestinal: Positive for vomiting and diarrhea. Negative for abdominal pain.  Musculoskeletal: Positive for back pain.  Neurological: Positive for focal weakness (chronic, bilateral eylids 2/2 MG). Weakness: chronic, bilateral eylids 2/2 MG.   Blood pressure 137/56, pulse 99, temperature 98.6 F (37 C), temperature source Oral, resp. rate 18, height 5\' 3"  (1.6 m), weight 170 lb (77.111 kg), SpO2 97.00%. Physical Exam  Constitutional: She is oriented to person, place, and time. She appears well-developed and well-nourished. No distress.  Neck: No JVD present. Carotid bruit is not present.  Cardiovascular: Normal rate, regular rhythm, normal heart sounds and intact distal pulses.  Exam reveals no gallop and no friction rub.   No murmur heard. Pulses:      Radial pulses are 2+ on the right side, and 2+ on the left side.       Dorsalis pedis pulses are 2+ on the right side, and 2+ on the left side.  Respiratory: Effort normal and breath sounds normal. No respiratory distress. She has no wheezes. She has no rales. She exhibits tenderness.  GI: Soft. Bowel sounds are normal. She exhibits no distension and no mass. There is no tenderness.  Musculoskeletal: She exhibits no edema.  Neurological: She is alert and oriented to person, place, and time.  Skin: Skin is warm and dry. She is not diaphoretic.  Psychiatric: She has a normal mood and affect. Her behavior is normal.    Assessment/Plan: Active Problems:   Chest pain   Diabetes mellitus   Myasthenia gravis   Essential hypertension, benign   Asthma, chronic  Plan: EKGs have been unremarkable. Enzymes negative x 4. D-dimer negative. CT of chest unremarkable. There is high suspicion that CP is musculoskeletal, in that it is sharp shooting pain that is reproducible with palpation. Would recommend a trial of Toradol.  However, due to risk factors, would recommend a Lexiscan NST. Will arrange for tomorrow AM. Will make NPO at midnight. Continue with PPI therapy.    Allayne Butcher, PA-C 03/14/2013, 10:14 AM  Agree with note written by Boyce Medici  PAC  CP is atypical/ musculoskeletal. + CRF. Enz neg. EKG w/o acute changes. Labs ok. CT chest OK. Exam benign except for tenderness to palpation of anterior chest. Suspect costochondritis. Agree with Toradol and MPI tomorrow. If neg can D/C home.  Runell Gess 03/14/2013 2:01 PM

## 2013-03-14 NOTE — Care Management Note (Signed)
    Page 1 of 1   03/14/2013     2:56:49 PM   CARE MANAGEMENT NOTE 03/14/2013  Patient:  [name], [name]   Account Number:  [id number]  Date Initiated:  03/14/2013  Documentation initiated by:  Donn Pierini  Subjective/Objective Assessment:   Pt admitted with chest pain- to have a two-day stress test     Action/Plan:   PTA pt lived at home   Anticipated DC Date:  03/15/2013   Anticipated DC Plan:  HOME/SELF CARE      DC Planning Services  CM consult      Choice offered to / List presented to:             Status of service:  In process, will continue to follow Medicare Important Message given?   (If response is "NO", the following Medicare IM given date fields will be blank) Date Medicare IM given:   Date Additional Medicare IM given:    Discharge Disposition:    Per UR Regulation:  Reviewed for med. necessity/level of care/duration of stay  If discussed at Long Length of Stay Meetings, dates discussed:    Comments:  PCP- Andi Devon  03/14/13- 1440- Donn Pierini RN, BSN [phone] Referral received for medication/rx needs- in to speak with pt at bedside- per conversation pt states that she has a CPAP at home but that it is old and she wonders about getting a new one- does not remember what agency she got it from- advised her to look for a sticker on machine and call company that provided machine to ask about if she would be up for a new machine or about trading it for a newer model- she states that her sleep study was about 2 yrs ago- pt is to f/u with regards to her CPAP and  her PCP regarding CPAP needs if she needs to re-qualify for a new one or can use her last sleep study to get a new machine- pt reports that she currently has insurance and uses [facility] for mail order for 90 day supplies of medications. No further needs from CM at this time-

## 2013-03-14 NOTE — Progress Notes (Signed)
[facility]  2D Echocardiogram completed.  Jarome Trull L Eleesha Purkey, RDCS 03/14/2013 12:03 PM

## 2013-03-15 ENCOUNTER — Observation Stay (HOSPITAL_COMMUNITY): Payer: 59

## 2013-03-15 DIAGNOSIS — E876 Hypokalemia: Secondary | ICD-10-CM

## 2013-03-15 DIAGNOSIS — R079 Chest pain, unspecified: Secondary | ICD-10-CM

## 2013-03-15 LAB — GLUCOSE, CAPILLARY: Glucose-Capillary: 97 mg/dL (ref 70–99)

## 2013-03-15 LAB — POTASSIUM: Potassium: 3.1 mEq/L — ABNORMAL LOW (ref 3.5–5.1)

## 2013-03-15 LAB — MAGNESIUM: Magnesium: 1.6 mg/dL (ref 1.5–2.5)

## 2013-03-15 MED ORDER — TECHNETIUM TC 99M SESTAMIBI GENERIC - CARDIOLITE
30.0000 | Freq: Once | INTRAVENOUS | Status: AC | PRN
  Administered 2013-03-15: 30 via INTRAVENOUS

## 2013-03-15 MED ORDER — TECHNETIUM TC 99M SESTAMIBI GENERIC - CARDIOLITE
10.0000 | Freq: Once | INTRAVENOUS | Status: AC | PRN
  Administered 2013-03-15: 10 via INTRAVENOUS

## 2013-03-15 MED ORDER — POTASSIUM CHLORIDE CRYS ER 20 MEQ PO TBCR
40.0000 meq | EXTENDED_RELEASE_TABLET | ORAL | Status: AC
  Administered 2013-03-15 (×2): 40 meq via ORAL
  Filled 2013-03-15 (×2): qty 2

## 2013-03-15 MED ORDER — MAGNESIUM OXIDE 400 (241.3 MG) MG PO TABS
400.0000 mg | ORAL_TABLET | Freq: Once | ORAL | Status: AC
  Administered 2013-03-15: 400 mg via ORAL
  Filled 2013-03-15: qty 1

## 2013-03-15 NOTE — Progress Notes (Signed)
Magnesium level is 1.6. Will supplement with 1 time dose of 400 mg Magnesium Oxide, in setting of hypokalemia. Supplemental K+ has already been ordered.   Allayne Butcher, PA-C [facility]

## 2013-03-15 NOTE — Progress Notes (Signed)
[facility]  Subjective: Continues to endorse palpitations. Chest pain moderately improved since yesterday.   Objective: Vital signs in last 24 hours: Temp:  [98.2 F (36.8 C)-98.4 F (36.9 C)] 98.2 F (36.8 C) (07/23 1914) Pulse Rate:  [81-100] 82 (07/23 0637) Resp:  [18] 18 (07/22 1417) BP: (160-168)/(61-87) 168/87 mmHg (07/23 0637) SpO2:  [95 %-98 %] 98 % (07/23 0637) Weight:  [173 lb (78.472 kg)] 173 lb (78.472 kg) (07/23 7829)    Intake/Output from previous day: 07/22 0701 - 07/23 0700 In: 480 [P.O.:480] Out: -  Intake/Output this shift:    Medications Current Facility-Administered Medications  Medication Dose Route Frequency Provider Last Rate Last Dose  . acetaminophen (TYLENOL) tablet 650 mg  650 mg Oral Q6H PRN Zannie Cove, MD      . aspirin chewable tablet 324 mg  324 mg Oral Daily Zannie Cove, MD   324 mg at 03/14/13 1051  . atorvastatin (LIPITOR) tablet 10 mg  10 mg Oral Daily Zannie Cove, MD   10 mg at 03/14/13 1051  . cycloSPORINE (RESTASIS) 0.05 % ophthalmic emulsion 1 drop  1 drop Both Eyes BID Zannie Cove, MD   1 drop at 03/14/13 2219  . enoxaparin (LOVENOX) injection 40 mg  40 mg Subcutaneous Q24H Pamella Pert, MD   40 mg at 03/14/13 2220  . folic acid-pyridoxine-cyancobalamin (FOLTX) 2.5-25-2 MG per tablet 1 tablet  1 tablet Oral Daily Zannie Cove, MD   1 tablet at 03/14/13 1051  . insulin aspart (novoLOG) injection 0-15 Units  0-15 Units Subcutaneous TID WC Zannie Cove, MD   5 Units at 03/14/13 1713  . insulin glargine (LANTUS) injection 70 Units  70 Units Subcutaneous BID Zannie Cove, MD   70 Units at 03/14/13 2220  . montelukast (SINGULAIR) tablet 10 mg  10 mg Oral QHS Zannie Cove, MD   10 mg at 03/14/13 2220  . mycophenolate (CELLCEPT) capsule 1,000 mg  1,000 mg Oral QHS Zannie Cove, MD   1,000 mg at 03/14/13 2219  . mycophenolate (CELLCEPT) capsule 1,500 mg  1,500 mg Oral Daily Zannie Cove,  MD   1,500 mg at 03/14/13 1052  . naproxen (NAPROSYN) tablet 250 mg  250 mg Oral BID PRN Brittainy Simmons, PA-C      . nitroGLYCERIN (NITROSTAT) SL tablet 0.4 mg  0.4 mg Sublingual Q5 min PRN Glynn Octave, MD   0.4 mg at 03/13/13 1121  . ondansetron (ZOFRAN) injection 4 mg  4 mg Intravenous Q6H PRN Zannie Cove, MD   4 mg at 03/14/13 1819  . oxyCODONE-acetaminophen (PERCOCET/ROXICET) 5-325 MG per tablet 1 tablet  1 tablet Oral Q4H PRN Leanne Chang, NP   1 tablet at 03/14/13 1546  . pantoprazole (PROTONIX) EC tablet 40 mg  40 mg Oral BID Zannie Cove, MD   40 mg at 03/14/13 2220  . potassium chloride SA (K-DUR,KLOR-CON) CR tablet 20 mEq  20 mEq Oral Daily Zannie Cove, MD   20 mEq at 03/14/13 1051  . predniSONE (DELTASONE) tablet 10 mg  10 mg Oral BID WC Zannie Cove, MD   10 mg at 03/14/13 1713  . regadenoson (LEXISCAN) injection SOLN 0.4 mg  0.4 mg Intravenous Once Brittainy Simmons, PA-C      . zolpidem (AMBIEN) tablet 5 mg  5 mg Oral QHS PRN Leanne Chang, NP   5 mg at 03/13/13 2327    PE: General appearance: alert, cooperative and no distress Lungs: clear to auscultation bilaterally Heart: regular  rhythm, fast rate Extremities: no LEE Pulses: 2+ and symmetric Skin: warm and dry Neurologic: Grossly normal  Lab Results:   Recent Labs  03/13/13 1100 03/14/13 0405  WBC 8.6 6.2  HGB 11.8* 10.0*  HCT 34.5* 29.2*  PLT 225 196   BMET  Recent Labs  03/13/13 1100 03/14/13 0405 03/15/13 0615  NA 140 141  --   K 3.0* 3.2* 3.1*  CL 102 108  --   CO2 26 26  --   GLUCOSE 200* 198*  --   BUN 9 6  --   CREATININE 0.50 0.49*  --   CALCIUM 10.2 9.0  --    Cardiac Panel (last 3 results)  Recent Labs  03/13/13 1913 03/14/13 0120 03/14/13 1010  TROPONINI <0.30 <0.30 <0.30    Assessment/Plan  Active Problems:   Chest pain   Diabetes mellitus   Myasthenia gravis   Essential hypertension, benign   Asthma, chronic  Plan: Cardiac enzymes negative x  3. Chest wall tenderness moderately improved with IV Toradol. Plan for NST today. Pt remains borderline tachycardic w/ HR near the upper 90s. She continues to endorse palpations. She had nausea, vomiting and diarrhea 2 days ago. ? Hyperthyroidism. TSH is low at 0.058. Recommend checking free T3 and T4. Synthroid was discontinued yesterday by [facility]. She is hypokalemic with K+ of 3.1. Replete with supplemental K+. She has been consistently hypokalemic since admission. Will check Mg and will supplement as needed.    LOS: 2 days    Brittainy M. Delmer Islam 03/15/2013 9:39 AM  Agree with note written by Boyce Medici  PAC  Myoview neg. Sx sound musculoskeletal. Will sign off. ROV with MLP 2-3 weeks and me after that.  Runell Gess 03/15/2013 3:18 PM

## 2013-03-15 NOTE — Progress Notes (Signed)
[facility] PROGRESS NOTE  [name] ZOX:[id number] DOB: Jun 19, 1951 DOA: 03/13/2013 PCP: Alva Garnet., MD  HPI: [name] is a 62 y.o. female with H/o Myasthenia, DM on Insulin, HTN, Dyslipidemia, h/o thyroid cancer was in her usual state of health till this am. She woke up with L sided chest pain this am, this is described as pressure like, it radiated up her chest and down to her L arm and caused some transient tightness and discomfort in her jaw. In addition she also had an episode of vomiting and 3 episodes of diarrhea following this, no hematemesis/melena/hematochezia. No cough, congestion, fevers or chills. Upon evaluation in ER, she had a EKG, cardiac enzymes and negative d-dimer, the EDP was concerned about Aortic Dissection and due to contrast allergy only a non contrast Ct chest was done which was benign. In addition, some of this pain is reproducible with palpation of her L chest wall.  Assessment/Plan: Chest pain with some typical and atypical features - Likely musculoskeletal, given reproducibility of pain. Cardiology consulted and given risks inpatient stress test today 7/23. Normal D-dimer  -cardiac enzymes neg, 2D ECHO with nl systolic function and no wall motion abnl  - monitor on tele  - clinically do not suspect aortic dissection based on reproducibility of pain, no back pain, no widened mediastinum on Ct and CXR.  - s/p stress myoview today >>neg for ischemia>> per cards ok to d/c but pt states unable to go home today due to family out of town with keys to home - will plan d/c in am Hypothyroidism - she is tachycardic; synthroid dose was increased 2 months ago. Has diarrhea and mild tremors.  - check TSH, holding Synthroid until results are back; restart synthroid based on TSH. She is now on 200 mcg which was increased from 175 mcg two months ago.  -TSH suppressed, I have ordered T4, will follow and resume appropriate dose in am.  DM:  -continue Lantus,  SSI  -hold metformin  H/o Myasthenia Gravis:  -followed by a [facility]rologist at [facility]  -continue cellcept and prednisone, will double dose of prednisone while in hospital  Dyslipidemia  -continue statin   Hypokalemia -replace k  DVT proph: lovenox   Code Status: presumed full Family Communication: none  Disposition Plan: pending stress test results  Consultants:  Cardiology  Procedures: echo Study Conclusions  - Left ventricle: The cavity size was normal. Systolic function was normal. Wall motion was normal; there were no regional wall motion abnormalities. Doppler parameters are consistent with abnormal left ventricular relaxation (grade 1 diastolic dysfunction). - Mitral valve: Calcified annulus. Transthoracic echocardiography. M-mode, complete 2D, spectral Doppler, and color Doppler. Stress test -Results PENDING  Antibiotics:  Anti-infectives   None     Antibiotics Given (last 72 hours)   None     HPI/Subjective: S/p stress test- states she feels tired and still some CP.   Objective: Filed Vitals:   03/14/13 0500 03/14/13 1417 03/14/13 2059 03/15/13 0637  BP: 137/56 160/61 166/80 168/87  Pulse: 99 81 100 82  Temp: 98.6 F (37 C) 98.3 F (36.8 C) 98.4 F (36.9 C) 98.2 F (36.8 C)  TempSrc: Oral Oral Oral Oral  Resp: 18 18    Height:      Weight:    78.472 kg (173 lb)  SpO2: 97% 97% 95% 98%    Intake/Output Summary (Last 24 hours) at 03/15/13 1100 Last data filed at 03/15/13 0900  Gross per 24 hour  Intake  480 ml  Output      0 ml  Net    480 ml   Filed Weights   03/13/13 1019 03/15/13 0637  Weight: 77.111 kg (170 lb) 78.472 kg (173 lb)    Exam:   General:  NAD  Cardiovascular: regular rate and rhythm, without MRG  Respiratory: good air movement, clear to auscultation throughout, no wheezing, rhonchi or rales  Abdomen: soft, not tender to palpation, positive bowel sounds  MSK: no peripheral edema  Neuro: CN 2-12 grossly  intact, non focal  Data Reviewed: Basic Metabolic Panel:  Recent Labs Lab 03/13/13 1100 03/14/13 0405 03/15/13 0615  NA 140 141  --   K 3.0* 3.2* 3.1*  CL 102 108  --   CO2 26 26  --   GLUCOSE 200* 198*  --   BUN 9 6  --   CREATININE 0.50 0.49*  --   CALCIUM 10.2 9.0  --   MG  --   --  1.6   Liver Function Tests:  Recent Labs Lab 03/13/13 1100  AST 18  ALT 21  ALKPHOS 111  BILITOT 0.3  PROT 7.1  ALBUMIN 3.7   CBC:  Recent Labs Lab 03/13/13 1100 03/14/13 0405  WBC 8.6 6.2  NEUTROABS 6.2  --   HGB 11.8* 10.0*  HCT 34.5* 29.2*  MCV 87.6 86.1  PLT 225 196   Cardiac Enzymes:  Recent Labs Lab 03/13/13 1100 03/13/13 1620 03/13/13 1913 03/14/13 0120 03/14/13 1010  TROPONINI <0.30 <0.30 <0.30 <0.30 <0.30   CBG:  Recent Labs Lab 03/14/13 0749 03/14/13 1227 03/14/13 1638 03/14/13 2213 03/15/13 0728  GLUCAP 182* 199* 211* 278* 97   Studies: Ct Chest Wo Contrast  03/13/2013   *RADIOLOGY REPORT*  Clinical Data: Chest pain, diaphoresis, nausea, jaw pain, shortness of breath, history diabetes, thyroid cancer, hypertension, myasthenia gravis  CT CHEST WITHOUT CONTRAST  Technique:  Multidetector CT imaging of the chest was performed following the standard protocol without IV contrast. Sagittal and coronal MPR images reconstructed from axial data set.  Comparison: Chest radiograph 02/11/2012  Findings: Scattered atherosclerotic calcifications aorta. No definite thoracic adenopathy. Surgical clips right upper quadrant question cholecystectomy. Visualized portion of upper abdomen otherwise unremarkable. Dependent atelectasis in both lower lobes. No acute infiltrate, pleural effusion or pneumothorax. No acute osseous findings.  IMPRESSION: Dependent bibasilar atelectasis. Otherwise negative noncontrast CT chest.   Original Report Authenticated By: Ulyses Southward, M.D.    Scheduled Meds: . aspirin  324 mg Oral Daily  . atorvastatin  10 mg Oral Daily  . cycloSPORINE  1  drop Both Eyes BID  . enoxaparin (LOVENOX) injection  40 mg Subcutaneous Q24H  . folic acid-pyridoxine-cyancobalamin  1 tablet Oral Daily  . insulin aspart  0-15 Units Subcutaneous TID WC  . insulin glargine  70 Units Subcutaneous BID  . montelukast  10 mg Oral QHS  . mycophenolate  1,000 mg Oral QHS  . mycophenolate  1,500 mg Oral Daily  . pantoprazole  40 mg Oral BID  . potassium chloride SA  20 mEq Oral Daily  . potassium chloride  40 mEq Oral Q4H  . predniSONE  10 mg Oral BID WC  . regadenoson  0.4 mg Intravenous Once   Continuous Infusions:   Active Problems:   Chest pain   Diabetes mellitus   Myasthenia gravis   Essential hypertension, benign   Asthma, chronic  Time spent: 25  Donnalee Curry, MD [facility] Pager [phone]. If 7 PM - 7 AM,  please contact night-coverage at [email or web address], password [facility] 03/15/2013, 11:00 AM  LOS: 2 days

## 2013-03-16 LAB — BASIC METABOLIC PANEL
CO2: 24 mEq/L (ref 19–32)
Glucose, Bld: 203 mg/dL — ABNORMAL HIGH (ref 70–99)
Potassium: 3.4 mEq/L — ABNORMAL LOW (ref 3.5–5.1)
Sodium: 140 mEq/L (ref 135–145)

## 2013-03-16 MED ORDER — POTASSIUM CHLORIDE CRYS ER 20 MEQ PO TBCR
40.0000 meq | EXTENDED_RELEASE_TABLET | Freq: Once | ORAL | Status: AC
  Administered 2013-03-16: 40 meq via ORAL
  Filled 2013-03-16: qty 2

## 2013-03-16 MED ORDER — LEVOTHYROXINE SODIUM 175 MCG PO TABS: 175.0000 ug | ORAL_TABLET | Freq: Every day | ORAL | Status: DC

## 2013-03-16 MED ORDER — OXYCODONE-ACETAMINOPHEN 5-325 MG PO TABS: 1.0000 | ORAL_TABLET | ORAL | Status: DC | PRN

## 2013-03-16 NOTE — Discharge Summary (Signed)
Physician Discharge Summary  [name] ZOX:[id number] DOB: 1950-09-07 DOA: 03/13/2013  PCP: Alva Garnet., MD  Admit date: 03/13/2013 Discharge date: 03/16/2013  Time spent: >30minutes  Recommendations for Outpatient Follow-up:      Follow-up Information   Follow up with HAGER, BRYAN, PA-C. (Our office scheduler will call you with the appt date and time.)    Contact information:   [address] [place] [address] [phone]       Follow up with Alva Garnet., MD. (in 1-2weeks,call for appt upon discharge)    Contact information:   [address] [place] [address] [phone]       Follow up with Dorisann Frames, MD. (in 2months, to have recheck TSH, call for appt upon discharge)    Contact information:   [address] [place] [address] [facility] [address] [phone]      Followup labs  -Bmet to follow up on potassium per PCP -TSH on followup with Dr. BALAN in55mos  Discharge Diagnoses:  Active Problems:   Chest pain   Diabetes mellitus   Myasthenia gravis   Essential hypertension, benign   Asthma, chronic  H/o thyroid cancer  Hypokalemia Discharge Condition: Improved/stable  Diet recommendation: Modified carbohydrate  Filed Weights   03/13/13 1019 03/15/13 0637  Weight: 77.111 kg (170 lb) 78.472 kg (173 lb)    History of present illness:  [name] is a 62 y.o. female with H/o Myasthenia, DM on Insulin, HTN, Dyslipidemia, h/o thyroid cancer was in her usual state of health till this am.  She woke up with L sided chest pain this am, this is described as pressure like, it radiated up her chest and down to her L arm and caused some transient tightness and discomfort in her jaw.  In addition she also had an episode of vomiting and 3 episodes of diarrhea following this, no hematemesis/melena/hematochezia.  No cough, congestion, fevers or chills.  Upon evaluation in ER, she had a EKG, cardiac enzymes and  negative d-dimer, the EDP was concerned about Aortic Dissection and due to contrast allergy only a non contrast Ct chest was done which was benign.  In addition, some of this pain is reproducible with palpation of her L chest wall.   Hospital Course:  Chest pain with some typical and atypical features - Likely musculoskeletal, given reproducibility of pain. Cardiology was consulted following admission. Pt had a Normal D-dimer  -cardiac enzymes neg, 2D ECHO with nl systolic function and no wall motion abnl  - monitor on tele  - clinically aortic dissection was not suspected based on reproducibility of pain, no back pain, no widened mediastinum on Ct and CXR. -Cardiology saw patient and a stress Myoview was ordered - s/p stress myoview 7/23 >>neg for ischemia>>and cardiology okay to DC and she is to follow up outpatient - patient has GERD possible cause of her chest pain and has been on anti-inflammatories and prednisone. Ibuprofen was discontinued in the hospital, and she was instructed to continue her Nexium twice a day and also to take Pepcid at at bedtime Hypothyroidism  - she was tachycardic; synthroid dose was increased 2 months ago. Has diarrhea and mild tremors.  - check TSH, holding Synthroid until results are back; restart synthroid based on TSH. She is now on 200 mcg which was increased from 175 mcg two months ago.  -TSH suppressed 0.058,T4 normal at 1.74. I discussed patient with her endocrinologist R. Dr. Talmage Nap and she recommends to change her dose  back to 175 mcg daily and she is to followup in the office in 2 months to have repeat TSH DM:  -She was placed on Lantus in the hospital and her metformin was held -She is to continue outpatient medications upon discharge  H/o Myasthenia Gravis:  -followed by a Neurologist at [facility]  -continue cellcept and prednisone, will double dose of prednisone while in hospital  Dyslipidemia  -continue statin  Hypokalemia  -Her potassium was replaced  in the hospital  Consultants:  Cardiology Procedures:  echo  Study Conclusions  - Left ventricle: The cavity size was normal. Systolic function was normal. Wall motion was normal; there were no regional wall motion abnormalities. Doppler parameters are consistent with abnormal left ventricular relaxation (grade 1 diastolic dysfunction). - Mitral valve: Calcified annulus. Transthoracic echocardiography. M-mode, complete 2D, spectral Doppler, and color Doppler.  Stress test  -Negative for ischemia     Discharge Exam: Filed Vitals:   03/15/13 1309 03/15/13 1524 03/15/13 2034 03/16/13 0446  BP: 158/48 148/54 178/82 157/76  Pulse:  77 101 96  Temp:  98.4 F (36.9 C) 98.5 F (36.9 C) 98.5 F (36.9 C)  TempSrc:  Oral Oral Oral  Resp:  18 18 18   Height:      Weight:      SpO2:  98% 99% 97%   Exam:  General: NAD  Cardiovascular: regular rate and rhythm, without MRG  Respiratory: good air movement, clear to auscultation throughout, no wheezing, rhonchi or rales  Abdomen: soft, not tender to palpation, positive bowel sounds  MSK: no peripheral edema  Neuro: CN 2-12 grossly intact, non focal   Discharge Instructions  Discharge Orders   Future Appointments Provider Department Dept Phone   03/29/2013 2:20 PM Wilburt Finlay, PA-C [facility] [place] [phone]   Future Orders Complete By Expires     Diet Carb Modified  As directed     Increase activity slowly  As directed         Medication List    STOP taking these medications       ibuprofen 800 MG tablet  Commonly known as:  ADVIL,MOTRIN      TAKE these medications       atorvastatin 10 MG tablet  Commonly known as:  LIPITOR  Take 10 mg by mouth daily.     cycloSPORINE 0.05 % ophthalmic emulsion  Commonly known as:  RESTASIS  Place 1 drop into both eyes 2 (two) times daily.     diclofenac sodium 1 % Gel  Commonly known as:  VOLTAREN  Apply 2 g topically 2 (two) times daily.      esomeprazole 40 MG capsule  Commonly known as:  NEXIUM  Take 40 mg by mouth 2 (two) times daily.     folic acid-pyridoxine-cyancobalamin 2.5-25-2 MG Tabs  Commonly known as:  FOLTX  Take 1 tablet by mouth daily.     insulin glargine 100 UNIT/ML injection  Commonly known as:  LANTUS  Inject 80 Units into the skin 2 (two) times daily.     insulin lispro 100 UNIT/ML injection  Commonly known as:  HUMALOG  Inject 100 Units into the skin 3 (three) times daily before meals.     levothyroxine 175 MCG tablet  Commonly known as:  SYNTHROID, LEVOTHROID  Take 1 tablet (175 mcg total) by mouth daily before breakfast.     meloxicam 15 MG tablet  Commonly known as:  MOBIC  Take 15 mg by mouth daily.  montelukast 10 MG tablet  Commonly known as:  SINGULAIR  Take 10 mg by mouth at bedtime.     mycophenolate 500 MG tablet  Commonly known as:  CELLCEPT  Take 500 mg by mouth See admin instructions. Patient states she takes 3 tabs in the AM and 2 tab in the PM     olmesartan 20 MG tablet  Commonly known as:  BENICAR  Take 20 mg by mouth daily.     oxyCODONE-acetaminophen 5-325 MG per tablet  Commonly known as:  PERCOCET/ROXICET  Take 1 tablet by mouth every 4 (four) hours as needed.     potassium chloride SA 20 MEQ tablet  Commonly known as:  K-DUR,KLOR-CON  Take 20 mEq by mouth daily.     predniSONE 10 MG tablet  Commonly known as:  DELTASONE  Take 10 mg by mouth 2 (two) times daily.     scopolamine 1.5 MG  Commonly known as:  TRANSDERM-SCOP  Place 1 patch onto the skin every 3 (three) days.       Allergies  Allergen Reactions  . Contrast Media (Iodinated Diagnostic Agents) Anaphylaxis  . Codeine        Follow-up Information   Follow up with HAGER, BRYAN, PA-C. (Our office scheduler will call you with the appt date and time.)    Contact information:   [address] [place] [address] [phone]       Follow up with Alva Garnet., MD.  (in 1-2weeks,call for appt upon discharge)    Contact information:   [address] [place] [address] [phone]       Follow up with Dorisann Frames, MD. (in 2months, to have recheck TSH, call for appt upon discharge)    Contact information:   [address] [place] [facility] [address] [phone]        The results of significant diagnostics from this hospitalization (including imaging, microbiology, ancillary and laboratory) are listed below for reference.    Significant Diagnostic Studies: Ct Chest Wo Contrast  03/13/2013   *RADIOLOGY REPORT*  Clinical Data: Chest pain, diaphoresis, nausea, jaw pain, shortness of breath, history diabetes, thyroid cancer, hypertension, myasthenia gravis  CT CHEST WITHOUT CONTRAST  Technique:  Multidetector CT imaging of the chest was performed following the standard protocol without IV contrast. Sagittal and coronal MPR images reconstructed from axial data set.  Comparison: Chest radiograph 02/11/2012  Findings: Scattered atherosclerotic calcifications aorta. No definite thoracic adenopathy. Surgical clips right upper quadrant question cholecystectomy. Visualized portion of upper abdomen otherwise unremarkable. Dependent atelectasis in both lower lobes. No acute infiltrate, pleural effusion or pneumothorax. No acute osseous findings.  IMPRESSION: Dependent bibasilar atelectasis. Otherwise negative noncontrast CT chest.   Original Report Authenticated By: Ulyses Southward, M.D.   Nm Myocar Multi W/spect W/wall Motion / Ef  03/15/2013   *RADIOLOGY REPORT*  Clinical Data:  Chest pain.  History of diabetes.  MYOCARDIAL IMAGING WITH SPECT (REST AND PHARMACOLOGIC-STRESS) GATED LEFT VENTRICULAR WALL MOTION STUDY LEFT VENTRICULAR EJECTION FRACTION  Technique:  Resting myocardial SPECT imaging was initially performed after intravenous administration of radiopharmaceutical. Myocardial SPECT was subsequently performed after additional  radiopharmaceutical injection during pharmacologic-stress (Lexiscan)supervised by the Cardiology staff.  Quantitative gated imaging was also performed to evaluate left ventricular wall motion, and estimate left ventricular ejection fraction.  Radiopharmaceutical:  10 mCi Tc-66m sestamibi at rest and 30 mCi during stress.  Comparison: Chest CT 03/13/2013.  Findings:  SPECT images demonstrate normal left ventricular activity .  There  are no suspicious fixed or reversible perfusion defects.  Gated cine images were reviewed on the workstation and demonstrate normal left ventricular wall motion and systolic thickening. The QGS ejection fraction measured at rest is 56% with an end diastolic volume of 48 ml and an end-systolic volume of 21 ml.  IMPRESSION:  Normal examination without evidence of pharmacologically induced myocardial ischemia.  The calculated left ventricular ejection fraction is 56%.   Original Report Authenticated By: Carey Bullocks, M.D.   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  On the third day the patient returned and received the radiopharmaceutical, per orally.  On the fifth day, the patient returned and whole body planar images were obtained in the anterior and posterior projections.  Radiopharmaceutical: 4.2 MILLI CURIE I-131 SODIUM IODIDE I 131 CAPSULE  Comparison:  08/10/2008  Findings: Physiologic tracer excretion within stomach and bowel loops. Minimal excreted tracer within urinary bladder. No residual radioiodine accumulation within the neck. No additional / abnormal sites of radioiodine accumulation are identified which are suspicious for iodine-avid metastatic thyroid cancer.   IMPRESSION: Negative whole-body I 131 scan for presence of iodine-avid metastatic thyroid cancer.   Original Report Authenticated By: Ulyses Southward, M.D.    Microbiology: No results found for this or any previous visit (from the past 240 hour(s)).   Labs: Basic Metabolic Panel:  Recent Labs Lab 03/13/13 1100 03/14/13 0405 03/15/13 0615 03/16/13 1030  NA 140 141  --  140  K 3.0* 3.2* 3.1* 3.4*  CL 102 108  --  105  CO2 26 26  --  24  GLUCOSE 200* 198*  --  203*  BUN 9 6  --  7  CREATININE 0.50 0.49*  --  0.54  CALCIUM 10.2 9.0  --  9.7  MG  --   --  1.6  --    Liver Function Tests:  Recent Labs Lab 03/13/13 1100  AST 18  ALT 21  ALKPHOS 111  BILITOT 0.3  PROT 7.1  ALBUMIN 3.7   No results found for this basename: LIPASE, AMYLASE,  in the last 168 hours No results found for this basename: AMMONIA,  in the last 168 hours CBC:  Recent Labs Lab 03/13/13 1100 03/14/13 0405  WBC 8.6 6.2  NEUTROABS 6.2  --   HGB 11.8* 10.0*  HCT 34.5* 29.2*  MCV 87.6 86.1  PLT 225 196   Cardiac Enzymes:  Recent Labs Lab 03/13/13 1100 03/13/13 1620 03/13/13 1913 03/14/13 0120 03/14/13 1010  TROPONINI <0.30 <0.30 <0.30 <0.30 <0.30   BNP: BNP (last 3 results) No results found for this basename: PROBNP,  in the last 8760 hours CBG:  Recent Labs Lab 03/15/13 0728 03/15/13 1501 03/15/13 2037 03/16/13 0817 03/16/13 1153  GLUCAP 97 105* 199* 142* 242*       Signed:  Ling Flesch C  [facility] 03/16/2013, 12:46 PM

## 2013-03-27 ENCOUNTER — Encounter: Payer: Self-pay | Admitting: *Deleted

## 2013-03-29 ENCOUNTER — Encounter: Payer: Self-pay | Admitting: Physician Assistant

## 2013-03-29 ENCOUNTER — Other Ambulatory Visit (HOSPITAL_COMMUNITY): Payer: Self-pay | Admitting: Endocrinology

## 2013-03-29 ENCOUNTER — Ambulatory Visit (INDEPENDENT_AMBULATORY_CARE_PROVIDER_SITE_OTHER): Payer: 59 | Admitting: Physician Assistant

## 2013-03-29 ENCOUNTER — Encounter: Payer: Self-pay | Admitting: Cardiovascular Disease

## 2013-03-29 VITALS — BP 140/80 | HR 112 | Ht 63.0 in | Wt 172.0 lb

## 2013-03-29 DIAGNOSIS — R079 Chest pain, unspecified: Secondary | ICD-10-CM

## 2013-03-29 DIAGNOSIS — R000 Tachycardia, unspecified: Secondary | ICD-10-CM | POA: Insufficient documentation

## 2013-03-29 DIAGNOSIS — C73 Malignant neoplasm of thyroid gland: Secondary | ICD-10-CM

## 2013-03-29 DIAGNOSIS — I498 Other specified cardiac arrhythmias: Secondary | ICD-10-CM

## 2013-03-29 DIAGNOSIS — I10 Essential (primary) hypertension: Secondary | ICD-10-CM

## 2013-03-29 MED ORDER — METOPROLOL TARTRATE 25 MG PO TABS: 12.5000 mg | ORAL_TABLET | Freq: Two times a day (BID) | ORAL | Status: DC

## 2013-03-29 NOTE — Assessment & Plan Note (Signed)
Blood pressure is mildly elevated which is tachycardic. Added a low-dose beta blocker to see if it will help with both.  I do not see where a free T3 has been drawn and shows been followed by Dr. Romero Belling.  She is hyperthyroid but certainly can explain her tachycardia.

## 2013-03-29 NOTE — Progress Notes (Signed)
Date:  03/29/2013   ID:  [name], DOB Dec 28, 1950, MRN [id number]  PCP:  Alva Garnet., MD  Primary Cardiologist:  Allyson Sabal     History of Present Illness: [name] is a 62 y.o. female The patient is a 62 y/o AAF, previously followed at [facility] by Dr. Lynnea Ferrier. She has not been seen in follow-up since 2011. She has a past medical history significant for DM, HTN, HLD, Myasthenia Gravis and thyroid cancer, s/p thyroidectomy. She also has a family history of heart disease. Her sister has CAD and underwent coronary stenting x 1. Both parents had CHF. The patient underwent a diagnostic LHC in 2012, in the setting of ongoing chest pain. The procedure was actually performed by Dr. Verdis Prime, of [facility], and demonstrated normal coronaries and normal LV function. Since then, she states that she has had occasional chest discomfort, which she has attributed to indigestion, as it has typically been relieved with antacids. However, she presented to the [facility] ER on 03/13/13 with a complaint of a different type of chest pain that she has never experienced before.  Patient underwent LexiScan myoview stress test which was negative.  She presents today in followup to her hospitalization. Patient continues to complain of chest pain which she says feels a pressure like someone is pushing on her and also feels like a sharp pain which radiates to her back and occasionally down her left arm. She also reports some nausea and sometimes some diaphoresis.  She also reports shortness of breath as well as orthopnea. She otherwise denies fever, chest pain, orthopnea, dizziness, PND, cough, congestion, abdominal pain, hematochezia, melena, lower extremity edema.  Recent TSH was very low with a high normal free T4. Patient's endocrinologist recently conducted and a whole body scan which is negative for presence of iodine-added metastatic thyroid cancer.  I believe there are plans for further studies.    EKG shows  a sinus tachycardia rate 112 beats per minute.    Wt Readings from Last 3 Encounters:  03/29/13 172 lb (78.019 kg)  03/15/13 173 lb (78.472 kg)     Past Medical History  Diagnosis Date  . Corns and callosities   . Difficulty in walking(719.7)   . Hypertension   . Myasthenia gravis   . High cholesterol   . Heart murmur   . Chest pain at rest     "woke me up in my sleep" (03/13/2013)  . Asthma   . Shortness of breath     "can happen at anytime" (03/13/2013)  . Sleep apnea     "use to wear mask; cause of insurance purposes I'm not not" (03/13/2013)  . Hypothyroidism   . Type II diabetes mellitus   . H/O hiatal hernia   . GERD (gastroesophageal reflux disease)   . Arthritis     "all over my body" (03/13/2013)  . Thyroid carcinoma   . Myasthenia gravis     "in my eyes; diagnsosed > 7 yr ago" (03/13/2013)  . History of stress test 07/11/2010    compared to previous study there is no significant change, Normal Myocardial Perfusion study, this is a low risk scan  . Hx of echocardiogram 07/08/2010    EF> 55% normal 2D Echo    Current Outpatient Prescriptions  Medication Sig Dispense Refill  . atorvastatin (LIPITOR) 10 MG tablet Take 10 mg by mouth daily.      . cycloSPORINE (RESTASIS) 0.05 % ophthalmic emulsion Place 1 drop into both eyes  2 (two) times daily.      . diclofenac sodium (VOLTAREN) 1 % GEL Apply 2 g topically 2 (two) times daily.      Marland Kitchen esomeprazole (NEXIUM) 40 MG capsule Take 40 mg by mouth 2 (two) times daily.      . folic acid-pyridoxine-cyancobalamin (FOLTX) 2.5-25-2 MG TABS Take 1 tablet by mouth daily.      . insulin glargine (LANTUS) 100 UNIT/ML injection Inject 80 Units into the skin 2 (two) times daily.      . insulin lispro (HUMALOG) 100 UNIT/ML injection Inject 100 Units into the skin 3 (three) times daily before meals.      Marland Kitchen levothyroxine (SYNTHROID, LEVOTHROID) 175 MCG tablet Take 1 tablet (175 mcg total) by mouth daily before breakfast.  30 tablet  0  .  meloxicam (MOBIC) 15 MG tablet Take 15 mg by mouth daily.      . montelukast (SINGULAIR) 10 MG tablet Take 10 mg by mouth at bedtime.      . mycophenolate (CELLCEPT) 500 MG tablet Take 500 mg by mouth See admin instructions. Patient states she takes 3 tabs in the AM and 2 tab in the PM      . olmesartan (BENICAR) 20 MG tablet Take 20 mg by mouth daily.      . potassium chloride SA (K-DUR,KLOR-CON) 20 MEQ tablet Take 20 mEq by mouth daily.      . predniSONE (DELTASONE) 10 MG tablet Take 10 mg by mouth 2 (two) times daily.      Marland Kitchen scopolamine (TRANSDERM-SCOP) 1.5 MG Place 1 patch onto the skin every 3 (three) days.      . metoprolol tartrate (LOPRESSOR) 25 MG tablet Take 0.5 tablets (12.5 mg total) by mouth 2 (two) times daily.  30 tablet  3   No current facility-administered medications for this visit.    Allergies:    Allergies  Allergen Reactions  . Contrast Media (Iodinated Diagnostic Agents) Anaphylaxis  . Codeine     Social History:  The patient  reports that she has never smoked. She has never used smokeless tobacco. She reports that she does not drink alcohol or use illicit drugs.   Family history:   Family History  Problem Relation Age of Onset  . Coronary artery disease Sister     s/p coronary stenting  . Congestive Heart Failure Mother   . Congestive Heart Failure Father   . Lung cancer Father     ROS:  Please see the history of present illness.  All other systems reviewed and negative.   PHYSICAL EXAM: VS:  BP 140/80  Pulse 112  Ht 5\' 3"  (1.6 m)  Wt 172 lb (78.019 kg)  BMI 30.48 kg/m2 Well nourished, well developed, in no acute distress HEENT: Pupils are equal round react to light accommodation extraocular movements are intact.  Neck: no JVDNo cervical lymphadenopathy. Cardiac: Regular rhythm, rate elevated without murmurs rubs or gallops. Lungs:  clear to auscultation bilaterally, no wheezing, rhonchi or rales Abd: soft, nontender, positive bowel sounds all  quadrants, no hepatosplenomegaly Ext: no lower extremity edema.  2+ radial and dorsalis pedis pulses. Skin: warm and dry Neuro:  Grossly normal  EKG: Sinus tachycardia rate 112 beats per minute     ASSESSMENT AND PLAN:  Problem List Items Addressed This Visit   Sinus tachycardia   Essential hypertension, benign     Blood pressure is mildly elevated which is tachycardic. Added a low-dose beta blocker to see if it will help with  both.  I do not see where a free T3 has been drawn and shows been followed by Dr. Romero Belling.  She is hyperthyroid but certainly can explain her tachycardia.    Relevant Medications      metoprolol tartrate (LOPRESSOR) tablet   Chest pain - Primary     Was able to reproduce the patient's pain with palpation. She did have a negative stress test this past month while at [facility]. I recommended she take 60 mg ibuprofen twice daily for the next 7 days.  She does not have improvement in followup.    Relevant Orders      EKG 12-Lead

## 2013-03-29 NOTE — Patient Instructions (Signed)
Take ibuprofen, 600 mg twice daily for 7 days. Also try adding some natural anti-inflammatories to her diet such as Tumeric, cinnamon, fresh ginger.  Follow up with Dr. Allyson Sabal in 3 months.

## 2013-03-29 NOTE — Assessment & Plan Note (Signed)
Was able to reproduce the patient's pain with palpation. She did have a negative stress test this past month while at [facility]. I recommended she take 60 mg ibuprofen twice daily for the next 7 days.  She does not have improvement in followup.

## 2013-03-30 ENCOUNTER — Other Ambulatory Visit (HOSPITAL_COMMUNITY): Payer: Self-pay | Admitting: Endocrinology

## 2013-03-30 DIAGNOSIS — C73 Malignant neoplasm of thyroid gland: Secondary | ICD-10-CM

## 2013-03-31 ENCOUNTER — Encounter (HOSPITAL_COMMUNITY)
Admission: RE | Admit: 2013-03-31 | Discharge: 2013-03-31 | Disposition: A | Discharge status: Home / Self Care | Payer: 59 | Source: Ambulatory Visit | Attending: Endocrinology | Admitting: Endocrinology

## 2013-03-31 ENCOUNTER — Encounter (HOSPITAL_COMMUNITY): Payer: Self-pay

## 2013-03-31 DIAGNOSIS — C73 Malignant neoplasm of thyroid gland: Secondary | ICD-10-CM | POA: Insufficient documentation

## 2013-03-31 LAB — GLUCOSE, CAPILLARY: Glucose-Capillary: 150 mg/dL — ABNORMAL HIGH (ref 70–99)

## 2013-03-31 MED ORDER — FLUDEOXYGLUCOSE F - 18 (FDG) INJECTION
17.3000 | Freq: Once | INTRAVENOUS | Status: AC | PRN
  Administered 2013-03-31: 17.3 via INTRAVENOUS

## 2013-04-11 ENCOUNTER — Other Ambulatory Visit: Payer: Self-pay | Admitting: Endocrinology

## 2013-04-11 DIAGNOSIS — C73 Malignant neoplasm of thyroid gland: Secondary | ICD-10-CM

## 2013-05-06 ENCOUNTER — Other Ambulatory Visit (HOSPITAL_COMMUNITY): Payer: Self-pay | Admitting: Internal Medicine

## 2013-05-08 ENCOUNTER — Encounter (HOSPITAL_COMMUNITY)
Admission: RE | Admit: 2013-05-08 | Discharge: 2013-05-08 | Disposition: A | Discharge status: Home / Self Care | Payer: 59 | Source: Ambulatory Visit | Attending: Endocrinology | Admitting: Endocrinology

## 2013-05-08 DIAGNOSIS — C73 Malignant neoplasm of thyroid gland: Secondary | ICD-10-CM | POA: Insufficient documentation

## 2013-05-08 MED ORDER — THYROTROPIN ALFA 1.1 MG IM SOLR
0.9000 mg | INTRAMUSCULAR | Status: AC
Start: 2013-05-08 — End: 2013-05-08
  Administered 2013-05-08: 0.9 mg via INTRAMUSCULAR
  Filled 2013-05-08: qty 0.9

## 2013-05-09 ENCOUNTER — Encounter (HOSPITAL_COMMUNITY)
Admission: RE | Admit: 2013-05-09 | Discharge: 2013-05-09 | Disposition: A | Discharge status: Home / Self Care | Payer: 59 | Source: Ambulatory Visit | Attending: Endocrinology | Admitting: Endocrinology

## 2013-05-09 DIAGNOSIS — C73 Malignant neoplasm of thyroid gland: Secondary | ICD-10-CM | POA: Insufficient documentation

## 2013-05-09 MED ORDER — THYROTROPIN ALFA 1.1 MG IM SOLR
0.9000 mg | INTRAMUSCULAR | Status: AC
Start: 2013-05-09 — End: 2013-05-09
  Administered 2013-05-09: 0.9 mg via INTRAMUSCULAR
  Filled 2013-05-09: qty 0.9

## 2013-05-10 ENCOUNTER — Encounter (HOSPITAL_COMMUNITY)
Admission: RE | Admit: 2013-05-10 | Discharge: 2013-05-10 | Disposition: A | Discharge status: Home / Self Care | Payer: 59 | Source: Ambulatory Visit | Attending: Endocrinology | Admitting: Endocrinology

## 2013-05-10 DIAGNOSIS — C73 Malignant neoplasm of thyroid gland: Secondary | ICD-10-CM | POA: Insufficient documentation

## 2013-05-10 MED ORDER — SODIUM IODIDE I 131 CAPSULE
159.7000 | Freq: Once | INTRAVENOUS | Status: AC | PRN
  Administered 2013-05-10: 159.7 via ORAL

## 2013-05-18 ENCOUNTER — Encounter (HOSPITAL_COMMUNITY)
Admission: RE | Admit: 2013-05-18 | Discharge: 2013-05-18 | Disposition: A | Discharge status: Home / Self Care | Payer: 59 | Source: Ambulatory Visit | Attending: Endocrinology | Admitting: Endocrinology

## 2013-05-18 ENCOUNTER — Encounter (HOSPITAL_COMMUNITY): Payer: Self-pay

## 2013-05-18 DIAGNOSIS — C73 Malignant neoplasm of thyroid gland: Secondary | ICD-10-CM

## 2013-06-29 ENCOUNTER — Other Ambulatory Visit: Payer: Self-pay

## 2013-06-30 ENCOUNTER — Ambulatory Visit (INDEPENDENT_AMBULATORY_CARE_PROVIDER_SITE_OTHER): Payer: 59 | Admitting: Physician Assistant

## 2013-06-30 ENCOUNTER — Encounter: Payer: Self-pay | Admitting: Physician Assistant

## 2013-06-30 VITALS — BP 140/70 | HR 80 | Ht 63.0 in | Wt 180.0 lb

## 2013-06-30 DIAGNOSIS — I10 Essential (primary) hypertension: Secondary | ICD-10-CM

## 2013-06-30 DIAGNOSIS — R002 Palpitations: Secondary | ICD-10-CM

## 2013-06-30 MED ORDER — METOPROLOL TARTRATE 25 MG PO TABS: 25.0000 mg | ORAL_TABLET | Freq: Two times a day (BID) | ORAL | Status: DC

## 2013-06-30 NOTE — Progress Notes (Signed)
Date:  06/30/2013   ID:  [name], DOB Sep 22, 1950, MRN [id number]  PCP:  Alva Garnet., MD  Primary Cardiologist:  Allyson Sabal    History of Present Illness: [name] is a 62 y.o. female who is followed by Dr. Allyson Sabal. I last saw her in August. She has a past medical history significant for DM, HTN, HLD, Myasthenia Gravis and thyroid cancer, s/p thyroidectomy. She also has a family history of heart disease. Her sister has CAD and underwent coronary stenting x 1. Both parents had CHF. The patient underwent a diagnostic LHC in 2012, in the setting of ongoing chest pain. The procedure was actually performed by Dr. Verdis Prime, of [facility], and demonstrated normal coronaries and normal LV function. Since then, she states that she has had occasional chest discomfort, which she has attributed to indigestion, as it has typically been relieved with antacids. However, she presented to the [facility] ER on 03/13/13 with a complaint of a different type of chest pain that she has never experienced before. Patient underwent LexiScan myoview stress test which was negative.   She had a reoccurrence of thyroid cancer and had her last round of radiation in September.  Her last 2D echo was 02/2013 and was essentially normal except for grade one diastolic dysfunction.    She presents today with complaints of continued palpitations which are intermittent.  When I saw her in August EKG was sinus tachycardia at 112.  I started her on Lopressor 12.5mg  BID.  This apparently has helped reduce the frequency.  She also reports SOB and lightheadedness during the palpitations, 4-5 pillow orthopnea.  She otherwise denies nausea, vomiting, fever, chest pain, PND, cough, congestion, abdominal pain, hematochezia, melena, lower extremity edema, claudication.  She is having a sleep study tonight.  She reports having a steroid injections in her shoulders recently.  Wt Readings from Last 3 Encounters:  06/30/13 180 lb  (81.647 kg)  03/29/13 172 lb (78.019 kg)  03/15/13 173 lb (78.472 kg)     Past Medical History  Diagnosis Date  . Corns and callosities   . Difficulty in walking(719.7)   . Hypertension   . Myasthenia gravis   . High cholesterol   . Heart murmur   . Chest pain at rest     "woke me up in my sleep" (03/13/2013)  . Asthma   . Shortness of breath     "can happen at anytime" (03/13/2013)  . Sleep apnea     "use to wear mask; cause of insurance purposes I'm not not" (03/13/2013)  . Hypothyroidism   . Type II diabetes mellitus   . H/O hiatal hernia   . GERD (gastroesophageal reflux disease)   . Arthritis     "all over my body" (03/13/2013)  . Thyroid carcinoma   . Myasthenia gravis     "in my eyes; diagnsosed > 7 yr ago" (03/13/2013)  . History of stress test 07/11/2010    compared to previous study there is no significant change, Normal Myocardial Perfusion study, this is a low risk scan  . Hx of echocardiogram 07/08/2010    EF> 55% normal 2D Echo    Current Outpatient Prescriptions  Medication Sig Dispense Refill  . cycloSPORINE (RESTASIS) 0.05 % ophthalmic emulsion Place 1 drop into both eyes 2 (two) times daily.      . diclofenac sodium (VOLTAREN) 1 % GEL Apply 2 g topically 2 (two) times daily.      Marland Kitchen esomeprazole (NEXIUM) 40  MG capsule Take 40 mg by mouth 2 (two) times daily.      . folic acid-pyridoxine-cyancobalamin (FOLTX) 2.5-25-2 MG TABS Take 1 tablet by mouth daily.      . insulin glargine (LANTUS) 100 UNIT/ML injection Inject 80 Units into the skin 2 (two) times daily.      . insulin lispro (HUMALOG) 100 UNIT/ML injection Inject 100 Units into the skin 3 (three) times daily before meals.      Marland Kitchen levothyroxine (SYNTHROID, LEVOTHROID) 175 MCG tablet Take 1 tablet (175 mcg total) by mouth daily before breakfast.  30 tablet  0  . meloxicam (MOBIC) 15 MG tablet Take 15 mg by mouth daily.      . metoprolol tartrate (LOPRESSOR) 25 MG tablet Take 1 tablet (25 mg total) by mouth 2  (two) times daily.  30 tablet  3  . montelukast (SINGULAIR) 10 MG tablet Take 10 mg by mouth at bedtime.      . mycophenolate (CELLCEPT) 500 MG tablet Take 500 mg by mouth See admin instructions. Patient states she takes 3 tabs in the AM and 2 tab in the PM      . olmesartan (BENICAR) 20 MG tablet Take 20 mg by mouth daily.      . potassium chloride SA (K-DUR,KLOR-CON) 20 MEQ tablet Take 20 mEq by mouth daily.      . predniSONE (DELTASONE) 10 MG tablet Take 10 mg by mouth 2 (two) times daily.      Marland Kitchen scopolamine (TRANSDERM-SCOP) 1.5 MG Place 1 patch onto the skin every 3 (three) days.      Marland Kitchen amoxicillin (AMOXIL) 500 MG capsule       . meclizine (ANTIVERT) 25 MG tablet       . metFORMIN (GLUCOPHAGE-XR) 500 MG 24 hr tablet       . mometasone (ELOCON) 0.1 % cream       . prednisoLONE acetate (PRED FORTE) 1 % ophthalmic suspension       . PROAIR HFA 108 (90 BASE) MCG/ACT inhaler       . zolpidem (AMBIEN) 10 MG tablet 10 mg.       No current facility-administered medications for this visit.    Allergies:    Allergies  Allergen Reactions  . Contrast Media [Iodinated Diagnostic Agents] Anaphylaxis  . Codeine     Social History:  The patient  reports that she has never smoked. She has never used smokeless tobacco. She reports that she does not drink alcohol or use illicit drugs.   Family history:   Family History  Problem Relation Age of Onset  . Coronary artery disease Sister     s/p coronary stenting  . Congestive Heart Failure Mother   . Congestive Heart Failure Father   . Lung cancer Father     ROS:  Please see the history of present illness.  All other systems reviewed and negative.   PHYSICAL EXAM: VS:  BP 140/70  Pulse 80  Ht 5\' 3"  (1.6 m)  Wt 180 lb (81.647 kg)  BMI 31.89 kg/m2 Obese, well developed, in no acute distress HEENT: Pupils are equal round react to light accommodation extraocular movements are intact.  Neck: no JVDNo cervical lymphadenopathy. Cardiac: Regular  rate and rhythm without murmurs rubs or gallops. Lungs:  clear to auscultation bilaterally, no wheezing, rhonchi or rales Abd: soft, nontender, positive bowel sounds all quadrants, no hepatosplenomegaly Ext: no lower extremity edema.  2+ radial and 1+ dorsalis pedis pulses. Skin: warm and dry Neuro:  Grossly normal     ASSESSMENT AND PLAN:  Problem List Items Addressed This Visit   Heart palpitations     Intermittent palpitations may be a result of excess thyroid hormone which when last checked was at the upper limits of normal.  She may also be having intermittent arrhythmia.  I will have her wear a 30 day cardionet monitor.  Increase Lopressor to 25mg  daily.   Thyroid studies will be rechecked by her endocrinologist.     Essential hypertension, benign     BP is mildly elevated.  Increased lopressor should help.    Relevant Medications      metoprolol tartrate (LOPRESSOR) tablet    Other Visit Diagnoses   Palpitation    -  Primary    Relevant Orders       Cardiac event monitor

## 2013-06-30 NOTE — Assessment & Plan Note (Signed)
BP is mildly elevated.  Increased lopressor should help.

## 2013-06-30 NOTE — Assessment & Plan Note (Signed)
Intermittent palpitations may be a result of excess thyroid hormone which when last checked was at the upper limits of normal.  She may also be having intermittent arrhythmia.  I will have her wear a 30 day cardionet monitor.  Increase Lopressor to 25mg  daily.   Thyroid studies will be rechecked by her endocrinologist.

## 2013-06-30 NOTE — Patient Instructions (Signed)
1.  Wear the heart rate monitor for 30 days. We will call you if we see any abnormality that needs to be treated immediately.  2.  Follow up with Dr. Allyson Sabal in one month.

## 2013-07-31 ENCOUNTER — Ambulatory Visit: Payer: 59 | Admitting: Cardiovascular Disease

## 2013-08-02 ENCOUNTER — Ambulatory Visit (INDEPENDENT_AMBULATORY_CARE_PROVIDER_SITE_OTHER): Payer: 59 | Admitting: Cardiovascular Disease

## 2013-08-02 ENCOUNTER — Encounter: Payer: Self-pay | Admitting: Cardiovascular Disease

## 2013-08-02 VITALS — BP 148/60 | HR 116 | Ht 63.0 in | Wt 175.0 lb

## 2013-08-02 DIAGNOSIS — I10 Essential (primary) hypertension: Secondary | ICD-10-CM

## 2013-08-02 DIAGNOSIS — I498 Other specified cardiac arrhythmias: Secondary | ICD-10-CM

## 2013-08-02 DIAGNOSIS — R079 Chest pain, unspecified: Secondary | ICD-10-CM

## 2013-08-02 DIAGNOSIS — R000 Tachycardia, unspecified: Secondary | ICD-10-CM

## 2013-08-02 MED ORDER — METOPROLOL TARTRATE 50 MG PO TABS: 50.0000 mg | ORAL_TABLET | Freq: Two times a day (BID) | ORAL | Status: DC

## 2013-08-02 NOTE — Assessment & Plan Note (Signed)
Controlled on current medications 

## 2013-08-02 NOTE — Progress Notes (Signed)
08/02/2013 [name]   1951/06/29  [id number]  Primary Physician [name]., MD Primary Cardiologist: [name] Gess MD [place], [place]   HPI:  [name] is a 62 y.o. female who is followed by Dr. Allyson Sabal. I last saw her in August. She has a past medical history significant for DM, HTN, HLD, Myasthenia Gravis and thyroid cancer, s/p thyroidectomy. She also has a family history of heart disease. Her sister has CAD and underwent coronary stenting x 1. Both parents had CHF. The patient underwent a diagnostic LHC in 2012, in the setting of ongoing chest pain. The procedure was actually performed by Dr. Verdis Prime, of [facility], and demonstrated normal coronaries and normal LV function. Since then, she states that she has had occasional chest discomfort, which she has attributed to indigestion, as it has typically been relieved with antacids. However, she presented to the [facility] ER on 03/13/13 with a complaint of a different type of chest pain that she has never experienced before. Patient underwent LexiScan myoview stress test which was negative. She had a reoccurrence of thyroid cancer and had her last round of radiation in September. Her last 2D echo was 02/2013 and was essentially normal except for grade one diastolic dysfunction.  She presents today with complaints of continued palpitations which are intermittent. When I saw her in August EKG was sinus tachycardia at 112. I started her on Lopressor 12.5mg  BID. This apparently has helped reduce the frequency. She also reports SOB and lightheadedness during the palpitations, 4-5 pillow orthopnea. She otherwise denies nausea, vomiting, fever, chest pain, PND, cough, congestion, abdominal pain, hematochezia, melena, lower extremity edema, claudication. Since she saw [name] back her symptoms are really aren't not changed. He did increase her beta blocker. She remains tachycardic. Her thyroid functions are normal. She has  a history of normal coronary arteries by cath normal Myoview last admission. I do to further titrate her beta blocker and have her return to see [name] in 3 months   Current Outpatient Prescriptions  Medication Sig Dispense Refill  . cycloSPORINE (RESTASIS) 0.05 % ophthalmic emulsion Place 1 drop into both eyes 2 (two) times daily.      . diclofenac sodium (VOLTAREN) 1 % GEL Apply 2 g topically 2 (two) times daily.      Marland Kitchen esomeprazole (NEXIUM) 40 MG capsule Take 40 mg by mouth 2 (two) times daily.      . folic acid-pyridoxine-cyancobalamin (FOLTX) 2.5-25-2 MG TABS Take 1 tablet by mouth daily.      . insulin glargine (LANTUS) 100 UNIT/ML injection Inject 80 Units into the skin 2 (two) times daily.      . insulin lispro (HUMALOG) 100 UNIT/ML injection Inject 100 Units into the skin 3 (three) times daily before meals.      Marland Kitchen levothyroxine (SYNTHROID, LEVOTHROID) 175 MCG tablet Take 175 mcg by mouth daily before breakfast. Monday -Thursday . Friday-Sunday is .      . meclizine (ANTIVERT) 25 MG tablet       . meloxicam (MOBIC) 15 MG tablet Take 15 mg by mouth daily.      . metFORMIN (GLUCOPHAGE-XR) 500 MG 24 hr tablet 500 mg 2 (two) times daily.       . metoprolol tartrate (LOPRESSOR) 50 MG tablet Take 1 tablet (50 mg total) by mouth 2 (two) times daily.  60 tablet  6  . mometasone (ELOCON) 0.1 % cream       . montelukast (SINGULAIR) 10  MG tablet Take 10 mg by mouth at bedtime.      . mycophenolate (CELLCEPT) 500 MG tablet Take 500 mg by mouth See admin instructions. Patient states she takes 3 tabs in the AM and 2 tab in the PM      . olmesartan (BENICAR) 20 MG tablet Take 20 mg by mouth daily.      . potassium chloride SA (K-DUR,KLOR-CON) 20 MEQ tablet Take 20 mEq by mouth 2 (two) times daily.       . predniSONE (DELTASONE) 10 MG tablet Take 10 mg by mouth 2 (two) times daily.      Marland Kitchen PROAIR HFA 108 (90 BASE) MCG/ACT inhaler       . zolpidem (AMBIEN) 10 MG tablet 10 mg.       No  current facility-administered medications for this visit.    Allergies  Allergen Reactions  . Contrast Media [Iodinated Diagnostic Agents] Anaphylaxis  . Codeine     History   Social History  . Marital Status: Single    Spouse Name: N/A    Number of Children: N/A  . Years of Education: N/A   Occupational History  . Not on file.   Social History Main Topics  . Smoking status: Never Smoker   . Smokeless tobacco: Never Used  . Alcohol Use: No  . Drug Use: No  . Sexual Activity: No   Other Topics Concern  . Not on file   Social History Narrative  . No narrative on file     Review of Systems: General: negative for chills, fever, night sweats or weight changes.  Cardiovascular: negative for chest pain, dyspnea on exertion, edema, orthopnea, palpitations, paroxysmal nocturnal dyspnea or shortness of breath Dermatological: negative for rash Respiratory: negative for cough or wheezing Urologic: negative for hematuria Abdominal: negative for nausea, vomiting, diarrhea, bright red blood per rectum, melena, or hematemesis Neurologic: negative for visual changes, syncope, or dizziness All other systems reviewed and are otherwise negative except as noted above.    Blood pressure 148/60, pulse 116, height 5\' 3"  (1.6 m), weight 175 lb (79.379 kg).  General appearance: alert and no distress Neck: no adenopathy, no carotid bruit, no JVD, supple, symmetrical, trachea midline and thyroid not enlarged, symmetric, no tenderness/mass/nodules Lungs: clear to auscultation bilaterally Heart: regular rate and rhythm, S1, S2 normal, no murmur, click, rub or gallop Extremities: extremities normal, atraumatic, no cyanosis or edema  EKG sinus tachycardia at 116 with nonspecific ST and T wave changes  ASSESSMENT AND PLAN:   Sinus tachycardia She continues to have sinus tachycardia despite having her beta blockers adjusted by [name] [facility] last office visit. Her T4 is normal. 8 event  monitor just showed sinus tachycardia without any arrhythmias. She does complain of palpitations. I'm going to increase her metoprolol from 25-50 mg by mouth twice a day.  Essential hypertension, benign Controlled on current medications      [name] Gess MD [facility], [facility] 08/02/2013 2:14 PM

## 2013-08-02 NOTE — Assessment & Plan Note (Signed)
She continues to have sinus tachycardia despite having her beta blockers adjusted by [facility] last office visit. Her T4 is normal. 8 event monitor just showed sinus tachycardia without any arrhythmias. She does complain of palpitations. I'm going to increase her metoprolol from 25-50 mg by mouth twice a day.

## 2013-08-02 NOTE — Patient Instructions (Signed)
  We will see you back in follow up in 3 months with [facility] and Dr Allyson Sabal in 6 months  Dr Allyson Sabal has ordered for you to increase the Metoprolol to 50mg  twice a day. I have sent that prescription into the pharmacy for you.

## 2013-08-03 ENCOUNTER — Encounter: Payer: Self-pay | Admitting: Cardiovascular Disease

## 2013-08-10 ENCOUNTER — Ambulatory Visit (HOSPITAL_BASED_OUTPATIENT_CLINIC_OR_DEPARTMENT_OTHER): Payer: 59 | Attending: Internal Medicine

## 2013-08-10 VITALS — Ht 63.0 in | Wt 171.0 lb

## 2013-08-10 DIAGNOSIS — I498 Other specified cardiac arrhythmias: Secondary | ICD-10-CM | POA: Insufficient documentation

## 2013-08-10 DIAGNOSIS — R0609 Other forms of dyspnea: Secondary | ICD-10-CM | POA: Insufficient documentation

## 2013-08-10 DIAGNOSIS — E079 Disorder of thyroid, unspecified: Secondary | ICD-10-CM | POA: Insufficient documentation

## 2013-08-10 DIAGNOSIS — R0989 Other specified symptoms and signs involving the circulatory and respiratory systems: Secondary | ICD-10-CM | POA: Insufficient documentation

## 2013-08-10 DIAGNOSIS — G4761 Periodic limb movement disorder: Secondary | ICD-10-CM | POA: Insufficient documentation

## 2013-08-10 DIAGNOSIS — G4733 Obstructive sleep apnea (adult) (pediatric): Secondary | ICD-10-CM | POA: Insufficient documentation

## 2013-08-13 DIAGNOSIS — G4733 Obstructive sleep apnea (adult) (pediatric): Secondary | ICD-10-CM

## 2013-08-13 NOTE — Sleep Study (Signed)
   NAME: [name] DATE OF BIRTH:  05/29/1951 MEDICAL RECORD NUMBER [id number]  LOCATION: [facility]  PHYSICIAN: Georgianna Band D  DATE OF STUDY: 08/10/2013  SLEEP STUDY TYPE: Nocturnal Polysomnogram               REFERRING PHYSICIAN: Alva Garnet., MD  INDICATION FOR STUDY: Insomnia with sleep apnea  EPWORTH SLEEPINESS SCORE:   2/24 HEIGHT: 5\' 3"  (160 cm)  WEIGHT: 171 lb (77.565 kg)    Body mass index is 30.3 kg/(m^2).  NECK SIZE: 17 in.  MEDICATIONS: Charted for review  SLEEP ARCHITECTURE: Total sleep time 336 minutes with sleep efficiency 86.7%. Stage I was 11.3%, stage II 75.9%, stage III absent, REM 12.8% of total sleep time. Sleep latency 8.5 minutes, REM latency 202 minutes, awake after sleep onset 43 minutes, arousal index 5.5. Bedtime medication: Ambien  RESPIRATORY DATA: Apnea hypopnea index (AHI) 7 per hour. A total of 39 events were scored including 2 obstructive apneas, 1 central apnea, 36 hypopneas. Events were not positional. REM AHI 20.9. There were not enough early events to permit application of split protocol CPAP titration.  OXYGEN DATA: Moderately loud snoring with oxygen desaturation to a nadir of 80% and mean oxygen saturation through the study of 91.2% on room air.  CARDIAC DATA: Sinus tachycardia with mean heart rate 104.6 per minute  MOVEMENT/PARASOMNIA: Periodic limb movement with arousal: A total of 35 limb jerks were counted of which 2 were associated with arousal or awakening for periodic limb movement with arousal index of 0.4 per hour which is not likely to be clinically significant.  IMPRESSION/ RECOMMENDATION:   1) Sleep pattern was marked by frequent spontaneous arousals and awakenings, mostly associated with respiratory events, in spite of Ambien. Mean heart rate through the study was 104.6 per minute suggesting overstimulation. Consider clinically in relation to the the patient history of thyroid disease. 2) Mild  obstructive sleep apnea/hypopnea syndrome, AHI 7 per hour. Non-positional events with REM AHI 20.9 per hour. Moderately loud snoring with oxygen desaturation to a nadir of 80% and mean oxygen saturation through the study of 91.2% on room air.  3) Scores in this range may respond to conservative therapy including sleep off flat of back, weight loss, attention to nasal congestion as appropriate. The patient can return for dedicated CPAP titration study if needed.   Signed Jetty Duhamel M.D. Waymon Budge Diplomate, American Board of Sleep Medicine  ELECTRONICALLY SIGNED ON:  08/13/2013, 8:49 AM [facility] PH: [phone]   FX: [phone] ACCREDITED BY THE AMERICAN ACADEMY OF SLEEP MEDICINE

## 2013-08-14 ENCOUNTER — Other Ambulatory Visit: Payer: Self-pay | Admitting: Internal Medicine

## 2013-08-14 ENCOUNTER — Ambulatory Visit
Admission: RE | Admit: 2013-08-14 | Discharge: 2013-08-14 | Disposition: A | Discharge status: Home / Self Care | Payer: 59 | Source: Ambulatory Visit | Attending: Internal Medicine | Admitting: Internal Medicine

## 2013-08-14 DIAGNOSIS — M25552 Pain in left hip: Secondary | ICD-10-CM

## 2013-11-14 ENCOUNTER — Encounter: Payer: Self-pay | Admitting: Podiatry

## 2013-11-14 ENCOUNTER — Ambulatory Visit (INDEPENDENT_AMBULATORY_CARE_PROVIDER_SITE_OTHER): Payer: 59 | Admitting: Podiatry

## 2013-11-14 VITALS — BP 142/78 | HR 101 | Ht 63.0 in | Wt 175.0 lb

## 2013-11-14 DIAGNOSIS — E1140 Type 2 diabetes mellitus with diabetic neuropathy, unspecified: Secondary | ICD-10-CM

## 2013-11-14 DIAGNOSIS — E1149 Type 2 diabetes mellitus with other diabetic neurological complication: Secondary | ICD-10-CM

## 2013-11-14 DIAGNOSIS — M79609 Pain in unspecified limb: Secondary | ICD-10-CM

## 2013-11-14 DIAGNOSIS — M48062 Spinal stenosis, lumbar region with neurogenic claudication: Secondary | ICD-10-CM | POA: Insufficient documentation

## 2013-11-14 DIAGNOSIS — M79606 Pain in leg, unspecified: Secondary | ICD-10-CM

## 2013-11-14 DIAGNOSIS — E1142 Type 2 diabetes mellitus with diabetic polyneuropathy: Secondary | ICD-10-CM

## 2013-11-14 NOTE — Progress Notes (Signed)
SUBJECTIVE:  63 y.o. year old female presents complaining of burning and numbness in both feet for duration of 2 months.   Also was diagnosed for Thyroid cancer and currently under cancer treatment.  Stated that her blood sugar is under control with Insulin therapy.   OBJECTIVE:  DERMATOLOGIC:  Nails: Mildly hypertrophic nails x 10.  Skin Integrity: no secondary findings  VASCULAR:  Pedal pulses: All pedal pulses are palpable with normal pulsation.  Capillary Filling times within 3 seconds in all digits.  Edema: negative noted in the lower limbs bilateral.  Ischemic changes negative on bilateral.  Temperature gradient from tibial crest to dorsum of foot is within normal bilateral.  NEUROLOGIC:  All epicritic and tactile sensations are grossly intact.  ORTHOPEDIC: Positive for contracted 4th digit left with under ridding 5th digit left. Flail toe 5th left from post surgical.   ASSESSMENT:  1. 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  2. Diabetic neuropathy.   PLAN:  Reviewed findings.  At this time will try topical analgesics first.  AGCL Compound cream prescribed for burning feet.

## 2013-11-14 NOTE — Patient Instructions (Signed)
Seen for burning feet. Will try topical medication at this time. Will re-assess in one month.

## 2013-12-15 ENCOUNTER — Ambulatory Visit (INDEPENDENT_AMBULATORY_CARE_PROVIDER_SITE_OTHER): Payer: 59 | Admitting: Podiatry

## 2013-12-15 ENCOUNTER — Encounter: Payer: Self-pay | Admitting: Podiatry

## 2013-12-15 VITALS — BP 165/83 | HR 93 | Ht 63.0 in | Wt 175.0 lb

## 2013-12-15 DIAGNOSIS — M79609 Pain in unspecified limb: Secondary | ICD-10-CM

## 2013-12-15 DIAGNOSIS — E1149 Type 2 diabetes mellitus with other diabetic neurological complication: Secondary | ICD-10-CM

## 2013-12-15 DIAGNOSIS — E1140 Type 2 diabetes mellitus with diabetic neuropathy, unspecified: Secondary | ICD-10-CM

## 2013-12-15 DIAGNOSIS — M79606 Pain in leg, unspecified: Secondary | ICD-10-CM

## 2013-12-15 DIAGNOSIS — E1142 Type 2 diabetes mellitus with diabetic polyneuropathy: Secondary | ICD-10-CM

## 2013-12-15 MED ORDER — PREGABALIN 100 MG PO CAPS: 100.0000 mg | ORAL_CAPSULE | Freq: Two times a day (BID) | ORAL | Status: DC

## 2013-12-15 NOTE — Progress Notes (Signed)
SUBJECTIVE:  63 y.o. year old female presents complaining of burning and numbness in both feet for duration of 3 months.  Pain is very nerve racking to her. Patient did not get compounding cream because of cost.  Stated that her blood sugar is under control since been on Insulin pump therapy.   OBJECTIVE: No changes in findings. DERMATOLOGIC:  Nails: Mildly hypertrophic nails x 10.  Skin Integrity: no secondary findings  VASCULAR:  All pedal pulses are palpable with normal pulsation.  Ischemic changes negative on bilateral.  Temperature gradient from tibial crest to dorsum of foot is within normal bilateral.  NEUROLOGIC:  All epicritic and tactile sensations are grossly intact.  Subjective burning sensation on bottom of both feet.  ORTHOPEDIC:  Positive for contracted 4th digit left with under ridding 5th digit left. Flail toe 5th left from post surgical.   ASSESSMENT:  1. Diabetic neuropathy with pain on both feet.   PLAN:  Reviewed findings.  Patient will continue with Voltaren. Lyrica 100 mg bid prescribed for burning feet.

## 2013-12-15 NOTE — Patient Instructions (Signed)
No change in Neuropathy.  Not using compounding cream. Will try oral medication. May take twice a day. If not effective may increase to three times a day.  Return in 1 month.

## 2014-01-16 ENCOUNTER — Encounter: Payer: Self-pay | Admitting: Podiatry

## 2014-01-16 ENCOUNTER — Ambulatory Visit (INDEPENDENT_AMBULATORY_CARE_PROVIDER_SITE_OTHER): Payer: 59 | Admitting: Podiatry

## 2014-01-16 VITALS — BP 157/84 | HR 87

## 2014-01-16 DIAGNOSIS — M79606 Pain in leg, unspecified: Secondary | ICD-10-CM

## 2014-01-16 DIAGNOSIS — D219 Benign neoplasm of connective and other soft tissue, unspecified: Secondary | ICD-10-CM

## 2014-01-16 DIAGNOSIS — M79609 Pain in unspecified limb: Secondary | ICD-10-CM

## 2014-01-16 DIAGNOSIS — D3610 Benign neoplasm of peripheral nerves and autonomic nervous system, unspecified: Secondary | ICD-10-CM | POA: Insufficient documentation

## 2014-01-16 NOTE — Patient Instructions (Signed)
Seen for pain and numbness on 3rd and 4th digits both feet. Assessment was made as Neuroma. May benefit from Custom orthotics, Cortisone injections, and well supported tennis shoes. Will contact patient with insurance info on Orthotics.

## 2014-01-16 NOTE — Progress Notes (Signed)
Subjective: Follow up on numbness on both feet.  Was prescribed with Lyrica and was not able to take due to GI upset. Today patient points 3rd and 4th digits being numb and painful even at rest.  Objective: Pain upon squeezing of 3rd web space bilateral. Hammer toe deformity with digital corn 4th left. HAV with bunion bilateral.   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 HAV with bunion bilateral. Forefoot varus bilateral. Hammer toe with digital corn 4th left.  Plan: Reviewed findings and available options; Orthotics, Tennis shoes, Cortisone injections, etc.  Patient will return for Orthotics.

## 2014-01-30 ENCOUNTER — Encounter: Payer: Self-pay | Admitting: Cardiovascular Disease

## 2014-01-30 ENCOUNTER — Ambulatory Visit (INDEPENDENT_AMBULATORY_CARE_PROVIDER_SITE_OTHER): Payer: 59 | Admitting: Cardiovascular Disease

## 2014-01-30 VITALS — BP 132/76 | HR 89 | Ht 63.0 in | Wt 173.4 lb

## 2014-01-30 DIAGNOSIS — I498 Other specified cardiac arrhythmias: Secondary | ICD-10-CM

## 2014-01-30 DIAGNOSIS — R000 Tachycardia, unspecified: Secondary | ICD-10-CM

## 2014-01-30 DIAGNOSIS — R079 Chest pain, unspecified: Secondary | ICD-10-CM

## 2014-01-30 DIAGNOSIS — I10 Essential (primary) hypertension: Secondary | ICD-10-CM

## 2014-01-30 MED ORDER — METOPROLOL TARTRATE 50 MG PO TABS: 75.0000 mg | ORAL_TABLET | Freq: Two times a day (BID) | ORAL | Status: DC

## 2014-01-30 NOTE — Assessment & Plan Note (Signed)
Patient continues to complain of atypical chest pain that is sporadic and episodic. It should be noted that she had a normal cardiac catheterization performed by Dr. Daneen Schick in 2012. She's had a Myoview stress test in the past as well which has been normal. I suspect that her pain is noncardiac most likely gastrointestinal

## 2014-01-30 NOTE — Assessment & Plan Note (Signed)
Patient complains of "racing heart" and palpitations. He on escalating doses of beta blockers. She has had an event monitor last November which was only notable for sinus tachycardia. She is on Lopressor 50 mg by mouth twice a day. I'm going to increase this to 75 mg by mouth twice a day to assess clinical efficacy. She does have an adequate blood pressure for this. Her thyroid function tests apparently have been normal as has a 2-D echocardiogram.

## 2014-01-30 NOTE — Patient Instructions (Signed)
We request that you follow-up in: 6 months with an extender and in 1 year with Dr Andria Rhein will receive a reminder letter in the mail two months in advance. If you don't receive a letter, please call our office to schedule the follow-up appointment.  Increase Metoprolol to 75mg  twice a day.  (that is 1 and 1/2 tablet twice a day)

## 2014-01-30 NOTE — Progress Notes (Signed)
01/30/2014 [name]   26-Sep-1950  [id number]  Primary Physician Salena Saner., MD Primary Cardiologist: Lorretta Harp MD [name]   HPI:  [name] is a 63 y.o. female who is followed by myself. I last saw her in December. She has a past medical history significant for DM, HTN, HLD, Myasthenia Gravis and thyroid cancer, s/p thyroidectomy. She also has a family history of heart disease. Her sister has CAD and underwent coronary stenting x 1. Both parents had CHF. The patient underwent a diagnostic LHC in 2012, in the setting of ongoing chest pain. The procedure was actually performed by Dr. Daneen Schick, of [facility], and demonstrated normal coronaries and normal LV function. Since then, she states that she has had occasional chest discomfort, which she has attributed to indigestion, as it has typically been relieved with antacids. However, she presented to the [facility] ER on 03/13/13 with a complaint of a different type of chest pain that she has never experienced before. Patient underwent LexiScan myoview stress test which was negative. of thyroid cancer and had her last round of radiation in September. Her last 2D echo was 02/2013 and was essentially normal except for grade one diastolic dysfunction.  I began her on Lopressor 12.21m BID and have been slowly titrating the dose.. This apparently has helped reduce the frequency. Since I saw her back in December she is medically stable. She continued to get episodic/occasional chest pain and does complain of occasional tachycardia.    Current Outpatient Prescriptions  Medication Sig Dispense Refill  . Alcohol Swabs (B-D SINGLE USE SWABS REGULAR) PADS       . BUTRANS 10 MCG/HR PTWK patch       . BYDUREON 2 MG SUSR Inject 2 mg as directed once a week.       . cyclobenzaprine (FLEXERIL) 10 MG tablet       . cycloSPORINE (RESTASIS) 0.05 % ophthalmic emulsion Place 1 drop into both eyes 2 (two) times daily.      .  diclofenac sodium (VOLTAREN) 1 % GEL Apply 2 g topically 2 (two) times daily.      .Marland Kitchenesomeprazole (NEXIUM) 40 MG capsule Take 40 mg by mouth 2 (two) times daily.      . folic acid-pyridoxine-cyancobalamin (FOLTX) 2.5-25-2 MG TABS Take 1 tablet by mouth daily.      . Insulin Disposable Pump (V-GO 30) KIT       . insulin lispro (HUMALOG) 100 UNIT/ML injection Inject 100 Units into the skin 3 (three) times daily before meals.      .Marland Kitchenlevothyroxine (SYNTHROID, LEVOTHROID) 175 MCG tablet Monday -Thursday 1725m. Friday-Sunday is 15053m      . meclizine (ANTIVERT) 25 MG tablet Take 25 mg by mouth as needed.       . meloxicam (MOBIC) 15 MG tablet Take 15 mg by mouth daily.      . metFORMIN (GLUCOPHAGE-XR) 500 MG 24 hr tablet 500 mg 2 (two) times daily.       . metoprolol (LOPRESSOR) 50 MG tablet Take 1.5 tablets (75 mg total) by mouth 2 (two) times daily.  270 tablet  3  . mometasone (ELOCON) 0.1 % cream       . montelukast (SINGULAIR) 10 MG tablet Take 10 mg by mouth at bedtime.      . mycophenolate (CELLCEPT) 500 MG tablet Take 500 mg by mouth See admin instructions. Patient states she takes 3 tabs in the AM and 2 tab  in the PM      . olmesartan (BENICAR) 20 MG tablet Take 20 mg by mouth daily.      . ONE TOUCH ULTRA TEST test strip       . potassium chloride SA (K-DUR,KLOR-CON) 20 MEQ tablet Take 20 mEq by mouth 2 (two) times daily.       Marland Kitchen PROAIR HFA 108 (90 BASE) MCG/ACT inhaler       . tobramycin (TOBREX) 0.3 % ophthalmic solution       . traMADol (ULTRAM) 50 MG tablet Take 50 mg by mouth as needed.       . Vitamin D, Ergocalciferol, (DRISDOL) 50000 UNITS CAPS capsule Take 50,000 Units by mouth 2 (two) times a week.       . zolpidem (AMBIEN) 10 MG tablet Take 10 mg by mouth as needed.       . predniSONE (DELTASONE) 10 MG tablet Take 10 mg by mouth 2 (two) times daily.       No current facility-administered medications for this visit.    Allergies  Allergen Reactions  . Contrast Media  [Iodinated Diagnostic Agents] Anaphylaxis  . Codeine     History   Social History  . Marital Status: Single    Spouse Name: N/A    Number of Children: N/A  . Years of Education: N/A   Occupational History  . Not on file.   Social History Main Topics  . Smoking status: Never Smoker   . Smokeless tobacco: Never Used  . Alcohol Use: No  . Drug Use: No  . Sexual Activity: No   Other Topics Concern  . Not on file   Social History Narrative  . No narrative on file     Review of Systems: General: negative for chills, fever, night sweats or weight changes.  Cardiovascular: negative for chest pain, dyspnea on exertion, edema, orthopnea, palpitations, paroxysmal nocturnal dyspnea or shortness of breath Dermatological: negative for rash Respiratory: negative for cough or wheezing Urologic: negative for hematuria Abdominal: negative for nausea, vomiting, diarrhea, bright red blood per rectum, melena, or hematemesis Neurologic: negative for visual changes, syncope, or dizziness All other systems reviewed and are otherwise negative except as noted above.    Blood pressure 132/76, pulse 89, height '5\' 3"'  (1.6 m), weight 173 lb 6.4 oz (78.654 kg).  General appearance: alert and no distress Neck: no adenopathy, no carotid bruit, no JVD, supple, symmetrical, trachea midline and thyroid not enlarged, symmetric, no tenderness/mass/nodules Lungs: clear to auscultation bilaterally Heart: regular rate and rhythm, S1, S2 normal, no murmur, click, rub or gallop Extremities: extremities normal, atraumatic, no cyanosis or edema  EKG sinus rhythm 89 without ST or T wave changes  ASSESSMENT AND PLAN:   Chest pain Patient continues to complain of atypical chest pain that is sporadic and episodic. It should be noted that she had a normal cardiac catheterization performed by Dr. Daneen Schick in 2012. She's had a Myoview stress test in the past as well which has been normal. I suspect that her pain  is noncardiac most likely gastrointestinal  Sinus tachycardia Patient complains of "racing heart" and palpitations. He on escalating doses of beta blockers. She has had an event monitor last November which was only notable for sinus tachycardia. She is on Lopressor 50 mg by mouth twice a day. I'm going to increase this to 75 mg by mouth twice a day to assess clinical efficacy. She does have an adequate blood pressure for this. Her thyroid function  tests apparently have been normal as has a 2-D echocardiogram.      Lorretta Harp MD [facility], [facility] 01/30/2014 9:01 AM

## 2014-02-05 ENCOUNTER — Ambulatory Visit (INDEPENDENT_AMBULATORY_CARE_PROVIDER_SITE_OTHER): Payer: 59

## 2014-02-05 ENCOUNTER — Encounter: Payer: Self-pay | Admitting: Podiatry

## 2014-02-05 ENCOUNTER — Ambulatory Visit (INDEPENDENT_AMBULATORY_CARE_PROVIDER_SITE_OTHER): Payer: 59 | Admitting: Podiatry

## 2014-02-05 VITALS — BP 161/83 | HR 101 | Resp 16 | Ht 62.0 in | Wt 174.0 lb

## 2014-02-05 DIAGNOSIS — M779 Enthesopathy, unspecified: Secondary | ICD-10-CM

## 2014-02-05 DIAGNOSIS — E1149 Type 2 diabetes mellitus with other diabetic neurological complication: Secondary | ICD-10-CM

## 2014-02-05 DIAGNOSIS — G5760 Lesion of plantar nerve, unspecified lower limb: Secondary | ICD-10-CM

## 2014-02-05 NOTE — Patient Instructions (Signed)
Diabetes and Foot Care Diabetes may cause you to have problems because of poor blood supply (circulation) to your feet and legs. This may cause the skin on your feet to become thinner, break easier, and heal more slowly. Your skin may become dry, and the skin may peel and crack. You may also have nerve damage in your legs and feet causing decreased feeling in them. You may not notice minor injuries to your feet that could lead to infections or more serious problems. Taking care of your feet is one of the most important things you can do for yourself.  HOME CARE INSTRUCTIONS  Wear shoes at all times, even in the house. Do not go barefoot. Bare feet are easily injured.  Check your feet daily for blisters, cuts, and redness. If you cannot see the bottom of your feet, use a mirror or ask someone for help.  Wash your feet with warm water (do not use hot water) and mild soap. Then pat your feet and the areas between your toes until they are completely dry. Do not soak your feet as this can dry your skin.  Apply a moisturizing lotion or petroleum jelly (that does not contain alcohol and is unscented) to the skin on your feet and to dry, brittle toenails. Do not apply lotion between your toes.  Trim your toenails straight across. Do not dig under them or around the cuticle. File the edges of your nails with an emery board or nail file.  Do not cut corns or calluses or try to remove them with medicine.  Wear clean socks or stockings every day. Make sure they are not too tight. Do not wear knee-high stockings since they may decrease blood flow to your legs.  Wear shoes that fit properly and have enough cushioning. To break in new shoes, wear them for just a few hours a day. This prevents you from injuring your feet. Always look in your shoes before you put them on to be sure there are no objects inside.  Do not cross your legs. This may decrease the blood flow to your feet.  If you find a minor scrape,  cut, or break in the skin on your feet, keep it and the skin around it clean and dry. These areas may be cleansed with mild soap and water. Do not cleanse the area with peroxide, alcohol, or iodine.  When you remove an adhesive bandage, be sure not to damage the skin around it.  If you have a wound, look at it several times a day to make sure it is healing.  Do not use heating pads or hot water bottles. They may burn your skin. If you have lost feeling in your feet or legs, you may not know it is happening until it is too late.  Make sure your health care provider performs a complete foot exam at least annually or more often if you have foot problems. Report any cuts, sores, or bruises to your health care provider immediately. SEEK MEDICAL CARE IF:   You have an injury that is not healing.  You have cuts or breaks in the skin.  You have an ingrown nail.  You notice redness on your legs or feet.  You feel burning or tingling in your legs or feet.  You have pain or cramps in your legs and feet.  Your legs or feet are numb.  Your feet always feel cold. SEEK IMMEDIATE MEDICAL CARE IF:   There is increasing redness,   swelling, or pain in or around a wound.  There is a red line that goes up your leg.  Pus is coming from a wound.  You develop a fever or as directed by your health care provider.  You notice a bad smell coming from an ulcer or wound. Document Released: 08/07/2000 Document Revised: 04/12/2013 Document Reviewed: 01/17/2013 [employer] Patient Information 2014 [employer].  

## 2014-02-05 NOTE — Progress Notes (Signed)
   Subjective:    Patient ID: [name], female    DOB: 1951-04-10, 63 y.o.   MRN: [id number]  HPI Comments: "I have pain in my feet and toes"  Patient c/o sharp and numb sensations forefoot and 3rd, 4th, 5th toes bilateral, left over right, for about 2 months. They wake her up at night. She has seen Dr. Caffie Pinto and tried Lyrica and made sick. She has tried Ibuprofen 800 mg but D/C due to increased heart rate. She massages some.   Foot Pain Associated symptoms include diaphoresis, myalgias and numbness.      Review of Systems  Constitutional: Positive for diaphoresis and activity change.  Eyes: Positive for pain and itching.  Musculoskeletal: Positive for back pain, gait problem and myalgias.  Neurological: Positive for numbness.  All other systems reviewed and are negative.      Objective:   Physical Exam        Assessment & Plan:

## 2014-02-05 NOTE — Progress Notes (Signed)
Subjective:     Patient ID: [name], female   DOB: 02/20/1951, 63 y.o.   MRN: [id number]  Foot Pain   patient presents stating that she's been getting stinging burning pain in the forefeet of both feet specifically around the third and fourth toes with a long-term history of diabetes that was not in good control for a number of years   Review of Systems  All other systems reviewed and are negative.      Objective:   Physical Exam  Nursing note and vitals reviewed. Constitutional: She is oriented to person, place, and time.  Cardiovascular: Intact distal pulses.   Musculoskeletal: Normal range of motion.  Neurological: She is oriented to person, place, and time.  Skin: Skin is warm.   neurovascular status intact with mild diminishment of sharp dull and vibratory but still intact and is noted to have significant discomfort in the third interspace of both feet with mild discomfort around the lesser MPJs. Muscle strength was adequate range of motion within normal limits and digits are well-perfused     Assessment:     Possible neuroma-like symptoms versus capsulitis versus diabetic neuropathy    Plan:     H&P and x-rays reviewed. Discussed all conditions and at this time I did do neuro lysis dehydration and alcohol purified injections in each third interspace and advised on reappoint in 2 weeks to evaluate the progress

## 2014-02-19 ENCOUNTER — Encounter: Payer: Self-pay | Admitting: Podiatry

## 2014-02-19 ENCOUNTER — Ambulatory Visit (INDEPENDENT_AMBULATORY_CARE_PROVIDER_SITE_OTHER): Payer: 59 | Admitting: Podiatry

## 2014-02-19 VITALS — BP 164/84 | HR 89 | Resp 16

## 2014-02-19 DIAGNOSIS — G5760 Lesion of plantar nerve, unspecified lower limb: Secondary | ICD-10-CM

## 2014-02-19 NOTE — Progress Notes (Signed)
Subjective:     Patient ID: [name], female   DOB: 13-Oct-1950, 63 y.o.   MRN: [id number]  HPI patient states she had complete resolution for the first week followed by gradual recurrence of symptoms that are not as intense as previously. We discussed her history of using Neurontin which her doctor does not want her to take while she is taking MOBIC   Review of Systems     Objective:   Physical Exam Neurovascular status intact no change in health history with inflammation and pain of the third interspace both feet and mild to moderate discomfort of the lesser MPJs of both feet    Assessment:     Probable neuroma symptomatology bilateral with possibility for inflammatory capsulitis and overall peripheral neuropathy    Plan:     Explained differences and conditions and today did reinject the third interspace of both feet with a purified dehydration and alcohol solution of 4% and Marcaine and reappoint again in 4 weeks to reevaluate and consider other treatment possibilities

## 2014-02-21 ENCOUNTER — Telehealth: Payer: Self-pay | Admitting: *Deleted

## 2014-02-21 NOTE — Telephone Encounter (Signed)
I saw him on Monday.  I had told him I had a reaction to the injection, it was red.  He told me it was probably from what he numbed it with.  He said he would just use a little of it this time.  My foot is red and swollen on top and it feels like it's on fire.  I don't know what to do.  It's worse this time.

## 2014-02-21 NOTE — Telephone Encounter (Signed)
I returned her call and told her Dr. Paulla Dolly said to use steroid cream and take Benadryl.  She asked what does the steroid cream do.  I told her it helps get the inflammation out.  I told her that he said he will not give her anymore injections.  She said I guess I'll find out what he's going to do on my next visit.  I don't know why I can't tolerate the injections but I have a lot going on.  I have Cancer so that may have something to do with it.

## 2014-03-08 ENCOUNTER — Other Ambulatory Visit: Payer: Self-pay | Admitting: *Deleted

## 2014-03-08 MED ORDER — METOPROLOL TARTRATE 50 MG PO TABS: 75.0000 mg | ORAL_TABLET | Freq: Two times a day (BID) | ORAL | Status: DC

## 2014-03-08 NOTE — Telephone Encounter (Signed)
Rx refill sent to patient pharmacy   

## 2014-03-19 ENCOUNTER — Ambulatory Visit: Payer: 59 | Admitting: Podiatry

## 2014-03-19 ENCOUNTER — Ambulatory Visit (INDEPENDENT_AMBULATORY_CARE_PROVIDER_SITE_OTHER): Payer: 59 | Admitting: Podiatry

## 2014-03-19 ENCOUNTER — Encounter: Payer: Self-pay | Admitting: Podiatry

## 2014-03-19 VITALS — BP 158/87 | HR 88 | Resp 18

## 2014-03-19 DIAGNOSIS — G5760 Lesion of plantar nerve, unspecified lower limb: Secondary | ICD-10-CM

## 2014-03-19 NOTE — Progress Notes (Signed)
Subjective:     Patient ID: [name], female   DOB: [birth year], 63 y.o.   MRN: [id number]  HPI patient continues to experience discomfort between the second and third and third and fourth toes of both feet and apparently had a reaction to the medication that we put into the third interspaces consisting of a purified alcohol solution   Review of Systems     Objective:   Physical Exam Neurovascular status intact with no muscle strength changes and patient's found to have moderate discomfort in the second and third third and fourth toes of both feet    Assessment:     Appears to be some kind of neuropathy or possible neuroma symptomatology    Plan:     Cannot use currently the neuro lysis agent due to reaction and I do not think surgery is best for her at this point I recommended she go back on Neurontin. She is going to see her family physician for this as she was concerned about her taking that plus MOBIC but I do think it would be okay

## 2014-06-01 ENCOUNTER — Ambulatory Visit (INDEPENDENT_AMBULATORY_CARE_PROVIDER_SITE_OTHER): Payer: 59 | Admitting: Neurology

## 2014-06-01 ENCOUNTER — Encounter: Payer: Self-pay | Admitting: Neurology

## 2014-06-01 VITALS — BP 151/84 | HR 95 | Resp 16 | Ht 63.0 in | Wt 178.0 lb

## 2014-06-01 DIAGNOSIS — E1165 Type 2 diabetes mellitus with hyperglycemia: Secondary | ICD-10-CM

## 2014-06-01 DIAGNOSIS — C73 Malignant neoplasm of thyroid gland: Secondary | ICD-10-CM | POA: Insufficient documentation

## 2014-06-01 DIAGNOSIS — G4701 Insomnia due to medical condition: Secondary | ICD-10-CM | POA: Insufficient documentation

## 2014-06-01 DIAGNOSIS — R351 Nocturia: Secondary | ICD-10-CM | POA: Insufficient documentation

## 2014-06-01 DIAGNOSIS — R0683 Snoring: Secondary | ICD-10-CM | POA: Insufficient documentation

## 2014-06-01 DIAGNOSIS — G7000 Myasthenia gravis without (acute) exacerbation: Secondary | ICD-10-CM

## 2014-06-01 DIAGNOSIS — IMO0002 Reserved for concepts with insufficient information to code with codable children: Secondary | ICD-10-CM

## 2014-06-01 DIAGNOSIS — E1139 Type 2 diabetes mellitus with other diabetic ophthalmic complication: Secondary | ICD-10-CM | POA: Insufficient documentation

## 2014-06-01 MED ORDER — SUVOREXANT 15 MG PO TABS: 15.0000 mg | ORAL_TABLET | Freq: Every evening | ORAL | Status: DC

## 2014-06-01 MED ORDER — ALPRAZOLAM 0.5 MG PO TABS: 0.5000 mg | ORAL_TABLET | Freq: Every evening | ORAL | Status: DC | PRN

## 2014-06-01 NOTE — Progress Notes (Signed)
S[facility]   Provider:  Larey Seat, M D  Referring Provider: Willey Blade, MD Primary Care Physician:  Salena Saner., MD  Chief Complaint  Patient presents with  . Hyperinsomnia w/ sleep Apnea    NP, paper referral, Rm 11    HPI:  [name] is a 63 y.o.retired , afro-american , right handed  female , who is seen here as a referral/  from Dr. Karlton Lemon for a sleep evaluation ,  The patient describes that she has insomnia problems she falls asleep but she cannot stay asleep. She will sleep for 2-3  hours and then will wake up. She says that she has great trouble to reinitiate sleep and often these 3 hours at night may be all she gets. Sometimes she wakes from pain related to her diabetic peripheral neuropathy it seems to be mostly in her left foot and left anterior thigh. She states that her insomnia seems to have going on for several months now, likely about a year. The patient had an inconclusive sleep study a year ago at the sleep [facility] at [place] had no advanced knowlegde  nor record of this.  She did not sleep in the lab,  she reports .  She was told the study will need to be repeated.  Sleep habits. 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   The patient usually goes to bed around 11:00 but she reports that it will take her up to 2 hours to fall asleep before she then sleeps for only 3 hours or so. She has nocturia related to diabetes which has further fragmented sleep. She sleeps alone but family and get stronger holes have reported that she snores loudly. She sleeps in a cool, prior to and dark bedroom but she can't is comfortable. She does not watch TV in bed. She spontaneously wakes up at 7 in the morning traditionally for many years and she rises at 7:30. She does not drink coffee or caffeinated beverages. She regularly eats breakfast. Besides the diabetic neuropathy she also has eye  disease and ptosis, diplopia  related to myasthenia gravis. She no longer drives at night time because of her vision impairment.    She has been using methylated Vitamin Y69 and Folic Acid to help with her diabetic neuropathy .She also has listed  Tramadol, Ambien, Tobrex eyedrops, prednisone, Benicar, CellCept, Singulair, Lopressor, Glucophage, Mobic, Synthroid, Xopenex and Humalog insulin.   The patient has a history of thyroid cancer arthritis, asthma,  corneal abrasion, GERD, diabetes, high cholesterol and a history of thyroid cancer.  Review of Systems: Out of a complete 14 system review, the patient complains of only the following symptoms, and all other reviewed systems are negative.   Epworth score 1, Fatigue severity score 43  , depression score o.   History   Social History  . Marital Status: Single    Spouse Name: N/A    Number of Children: 0  . Years of Education: college   Occupational History  . retired    Social History Main Topics  . Smoking status: Never Smoker   . Smokeless tobacco: Never Used  . Alcohol Use: No  . Drug Use: No  . Sexual Activity: No   Other Topics Concern  . Not on file   Social History Narrative  . No narrative on file    Family History  Problem Relation Age of  Onset  . Coronary artery disease Sister     s/p coronary stenting  . Congestive Heart Failure Mother   . Congestive Heart Failure Father   . Lung cancer Father     Past Medical History  Diagnosis Date  . Corns and callosities   . Difficulty in walking(719.7)   . Hypertension   . Myasthenia gravis   . High cholesterol   . Heart murmur   . Chest pain at rest     "woke me up in my sleep" (03/13/2013)  . Asthma   . Shortness of breath     "can happen at anytime" (03/13/2013)  . Sleep apnea     "use to wear mask; cause of insurance purposes I'm not not" (03/13/2013)  . Hypothyroidism   . Type II diabetes mellitus   . H/O hiatal hernia   . GERD (gastroesophageal  reflux disease)   . Arthritis     "all over my body" (03/13/2013)  . Thyroid carcinoma   . Myasthenia gravis     "in my eyes; diagnsosed > 7 yr ago" (03/13/2013)  . History of stress test 07/11/2010    compared to previous study there is no significant change, Normal Myocardial Perfusion study, this is a low risk scan  . Hx of echocardiogram 07/08/2010    EF> 55% normal 2D Echo  . Palpitations     Past Surgical History  Procedure Laterality Date  . Total thyroidectomy    . Tonsillectomy    . Abdominal hysterectomy    . Appendectomy    . Cholecystectomy    . Carpal tunnel release Right   . Knee arthroscopy Left   . Cataract extraction w/ intraocular lens  implant, bilateral Bilateral   . Cardiac catheterization      "several" (03/13/2013)  . Anterior cervical decomp/discectomy fusion      "I've had severa ORs; always went in from the front" (03/13/2013)  . Shoulder arthroscopy w/ rotator cuff repair Left     Current Outpatient Prescriptions  Medication Sig Dispense Refill  . Alcohol Swabs (B-D SINGLE USE SWABS REGULAR) PADS       . B-D ULTRAFINE III SHORT PEN 31G X 8 MM MISC       . BUTRANS 10 MCG/HR PTWK patch       . BYDUREON 2 MG SUSR Inject 2 mg as directed once a week.       . cyclobenzaprine (FLEXERIL) 10 MG tablet       . cycloSPORINE (RESTASIS) 0.05 % ophthalmic emulsion Place 1 drop into both eyes 2 (two) times daily.      Marland Kitchen dexamethasone (DECADRON) 0.1 % ophthalmic solution       . diclofenac sodium (VOLTAREN) 1 % GEL Apply 2 g topically 2 (two) times daily.      Marland Kitchen esomeprazole (NEXIUM) 40 MG capsule Take 40 mg by mouth 2 (two) times daily.      . folic acid-pyridoxine-cyancobalamin (FOLTX) 2.5-25-2 MG TABS Take 1 tablet by mouth daily.      Marland Kitchen ibuprofen (ADVIL,MOTRIN) 800 MG tablet       . Insulin Disposable Pump (V-GO 30) KIT       . insulin lispro (HUMALOG) 100 UNIT/ML injection Inject 100 Units into the skin 3 (three) times daily before meals.      Marland Kitchen  L-Methylfolate-B6-B12 (METANX PO) Take by mouth 2 (two) times daily.      Marland Kitchen levalbuterol (XOPENEX) 0.63 MG/3ML nebulizer solution       .  LEVEMIR FLEXTOUCH 100 UNIT/ML Pen       . levothyroxine (SYNTHROID, LEVOTHROID) 175 MCG tablet Monday -Thursday 182mg. Friday-Sunday is 1523m.      . meclizine (ANTIVERT) 25 MG tablet Take 25 mg by mouth as needed.       . meloxicam (MOBIC) 15 MG tablet Take 15 mg by mouth daily.      . metFORMIN (GLUCOPHAGE-XR) 500 MG 24 hr tablet 500 mg 2 (two) times daily.       . metoprolol (LOPRESSOR) 50 MG tablet Take 1.5 tablets (75 mg total) by mouth 2 (two) times daily.  270 tablet  2  . mometasone (ELOCON) 0.1 % cream       . montelukast (SINGULAIR) 10 MG tablet Take 10 mg by mouth at bedtime.      . mycophenolate (CELLCEPT) 500 MG tablet Take 500 mg by mouth See admin instructions. Patient states she takes 3 tabs in the AM and 2 tab in the PM      . olmesartan (BENICAR) 20 MG tablet Take 20 mg by mouth daily.      . ONE TOUCH ULTRA TEST test strip       . potassium chloride SA (K-DUR,KLOR-CON) 20 MEQ tablet Take 20 mEq by mouth 2 (two) times daily.       . predniSONE (DELTASONE) 10 MG tablet Take 10 mg by mouth 2 (two) times daily.      . Marland KitchenROAIR HFA 108 (90 BASE) MCG/ACT inhaler       . tobramycin (TOBREX) 0.3 % ophthalmic solution       . traMADol (ULTRAM) 50 MG tablet Take 50 mg by mouth as needed.       . Vitamin D, Ergocalciferol, (DRISDOL) 50000 UNITS CAPS capsule Take 50,000 Units by mouth 2 (two) times a week.       . zolpidem (AMBIEN) 10 MG tablet Take 10 mg by mouth as needed.       . ALPRAZolam (XANAX) 0.5 MG tablet Take 1 tablet (0.5 mg total) by mouth at bedtime as needed for anxiety.  30 tablet  0  . Suvorexant (BELSOMRA) 15 MG TABS Take 15 mg by mouth Nightly.  6 tablet  0   No current facility-administered medications for this visit.    Allergies as of 06/01/2014 - Review Complete 06/01/2014  Allergen Reaction Noted  . Contrast media  [iodinated diagnostic agents] Anaphylaxis 03/13/2013  . Fluorescein Shortness Of Breath 03/19/2014  . Iodine Anaphylaxis 03/19/2014  . Codeine Hives and Nausea Only 02/27/2013    Vitals: BP 151/84  Pulse 95  Resp 16  Ht '5\' 3"'  (1.6 m)  Wt 178 lb (80.74 kg)  BMI 31.54 kg/m2 Last Weight:  Wt Readings from Last 1 Encounters:  06/01/14 178 lb (80.74 kg)       Last Height:   Ht Readings from Last 1 Encounters:  06/01/14 '5\' 3"'  (1.6 m)    THIS PATIENT HAS AN INULIN PUMP.   Physical exam:  General: The patient is awake, alert and appears not in acute distress. The patient is well groomed. Head: Normocephalic, atraumatic. Neck is supple. Mallampati 4, myasthenic delayed pharyngeal response, dysphagia.   neck circumference: . Nasal airflow 15.25  , TMJ is  Described, claudication and click . Retrognathia is seen.  Cardiovascular:  Regular rate and rhythm , without  murmurs or carotid bruit, and without distended neck veins. Respiratory: Lungs are clear to auscultation. Skin:  Without evidence of edema, or rash Trunk: BMI is  elevated and patient  has normal posture.  Neurologic exam : The patient is awake and alert, oriented to place and time.   Memory subjective described as intact.  There is a normal attention span & concentration ability. Speech is fluent without dysarthria, dysphonia or aphasia. Mood and affect are appropriate.  Cranial nerves: Pupils are equal and briskly reactive to light. Funduscopic exam with evidence of pallor, laser surgery, cotton wool. PTOSIS bilaterally. She has floaters,  Visual (poor )acuity 20-200 OD and 20-100 OS. Extraocular movements  in vertical and horizontal planes intact and without nystagmus. Visual fields by finger perimetry are intact. Hearing to finger rub intact.  Facial sensation intact to fine touch. Facial motor strength is symmetric and tongue and uvula move midline.  Motor exam:  muscle bulk and symmetric ,strength in all  extremities.  Sensory:  Meralgia paresthetica left anterior thigh pain and dysesthesias. Fine touch, pinprick and vibration were tested in all extremities. Loss of vibration in toes, felt some in ankles.  Proprioception is tested in the upper extremities only.  This was normal.  Coordination: Rapid alternating movements in the fingers/hands is normal. Finger-to-nose maneuver  normal without evidence of ataxia, dysmetria or tremor.  Gait and station:  Patient walks without assistive device and is able unassisted to climb up to the exam table. Strength within normal limits. Stance is wide based , stable.  Tandem gait is mildly ataxic. Romberg testing is negative.  Deep tendon reflexes: in the  upper and lower extremities are absent - no patella and no achilles tendon reflex.  Babinski maneuver response is downgoing.   Assessment:  After physical and neurologic examination, review of laboratory studies, imaging, neurophysiology testing and pre-existing records, assessment is :  This patient has a high risk of OSA from her neuromuscular condition. She is diabetic and has pain and nocturia related  Sleep fragmentation on top.  Her insomnia is not longer responsive to Ambien and she takes up to 20 mg at night.  RLS, painful sensory neuropathy and meralgia paresthetica.     The patient was advised of the nature of the diagnosed sleep disorder , the treatment options and risks for general a health and wellness arising from not treating the condition. Visit duration was 45 minutes.   Plan:  Treatment plan and additional workup : Would like to try Seroquel, but this patient is diabetic and at higher risk of side effects.  Neurontin 300 mg at night po for neuropathic pain , RLS. BELSOMRA  15 mg samples given 6 pills.  Melatonin recommended. 5 mg one hour before bedtime.  Morning headaches , migraines : CO2 retention?   SPLIT study with AHI at 10, score at 3 %      Larey Seat  MD  06/01/2014

## 2014-06-22 ENCOUNTER — Other Ambulatory Visit: Payer: Self-pay | Admitting: Family

## 2014-06-22 DIAGNOSIS — L720 Epidermal cyst: Secondary | ICD-10-CM

## 2014-06-25 ENCOUNTER — Ambulatory Visit
Admission: RE | Admit: 2014-06-25 | Discharge: 2014-06-25 | Disposition: A | Discharge status: Home / Self Care | Payer: 59 | Source: Ambulatory Visit | Attending: Family | Admitting: Family

## 2014-06-25 DIAGNOSIS — L720 Epidermal cyst: Secondary | ICD-10-CM

## 2014-07-08 ENCOUNTER — Other Ambulatory Visit: Payer: Self-pay | Admitting: Neurology

## 2014-07-08 ENCOUNTER — Ambulatory Visit (INDEPENDENT_AMBULATORY_CARE_PROVIDER_SITE_OTHER): Payer: 59 | Admitting: Neurology

## 2014-07-08 DIAGNOSIS — G4701 Insomnia due to medical condition: Secondary | ICD-10-CM

## 2014-07-08 DIAGNOSIS — R351 Nocturia: Secondary | ICD-10-CM

## 2014-07-08 DIAGNOSIS — E1139 Type 2 diabetes mellitus with other diabetic ophthalmic complication: Secondary | ICD-10-CM

## 2014-07-08 DIAGNOSIS — IMO0002 Reserved for concepts with insufficient information to code with codable children: Secondary | ICD-10-CM

## 2014-07-08 DIAGNOSIS — R0683 Snoring: Secondary | ICD-10-CM

## 2014-07-08 DIAGNOSIS — C73 Malignant neoplasm of thyroid gland: Secondary | ICD-10-CM

## 2014-07-08 DIAGNOSIS — G7000 Myasthenia gravis without (acute) exacerbation: Secondary | ICD-10-CM

## 2014-07-08 DIAGNOSIS — E1165 Type 2 diabetes mellitus with hyperglycemia: Secondary | ICD-10-CM

## 2014-07-08 NOTE — Sleep Study (Signed)
Please see the scanned sleep study interpretation located in the Procedure tab within the Chart Review section. 

## 2014-07-10 NOTE — Telephone Encounter (Signed)
Rx signed and faxed.

## 2014-07-23 ENCOUNTER — Telehealth: Payer: Self-pay | Admitting: *Deleted

## 2014-07-24 ENCOUNTER — Encounter: Payer: Self-pay | Admitting: Cardiology

## 2014-07-24 ENCOUNTER — Ambulatory Visit (INDEPENDENT_AMBULATORY_CARE_PROVIDER_SITE_OTHER): Payer: 59 | Admitting: Cardiology

## 2014-07-24 VITALS — BP 140/70 | HR 93 | Ht 62.0 in | Wt 167.1 lb

## 2014-07-24 DIAGNOSIS — E1165 Type 2 diabetes mellitus with hyperglycemia: Secondary | ICD-10-CM

## 2014-07-24 DIAGNOSIS — IMO0002 Reserved for concepts with insufficient information to code with codable children: Secondary | ICD-10-CM

## 2014-07-24 DIAGNOSIS — I471 Supraventricular tachycardia: Secondary | ICD-10-CM

## 2014-07-24 DIAGNOSIS — R0789 Other chest pain: Secondary | ICD-10-CM

## 2014-07-24 DIAGNOSIS — G4733 Obstructive sleep apnea (adult) (pediatric): Secondary | ICD-10-CM

## 2014-07-24 DIAGNOSIS — E1139 Type 2 diabetes mellitus with other diabetic ophthalmic complication: Secondary | ICD-10-CM

## 2014-07-24 DIAGNOSIS — R000 Tachycardia, unspecified: Secondary | ICD-10-CM

## 2014-07-24 MED ORDER — METOPROLOL TARTRATE 100 MG PO TABS: 100.0000 mg | ORAL_TABLET | Freq: Two times a day (BID) | ORAL | Status: DC

## 2014-07-24 NOTE — Patient Instructions (Signed)
We have increased your lopressor to 100 mg two times a day  Your physician recommends that you schedule a follow-up appointment in: 2 months with Dr. Gwenlyn Found

## 2014-07-24 NOTE — Assessment & Plan Note (Addendum)
Still with episodes of tachycardia,  Now occurring daily.  She is having thyroid issues.  I am increasing her lopressor to 100 mg BID, she has enough BP room.  Will send today's note to Dr. Bubba Camp, also will have pt follow up with Dr. Adora Fridge in 2 months to make sure she has improved control with her tachycardia.

## 2014-07-24 NOTE — Telephone Encounter (Signed)
Patient phoned our office and is requesting to speak with Dr. Brett Fairy.  Patient is hesitant about doing a CPAP titration study and there is not an open appointment until January where she could see the physician.  She states she just wants to ask her a couple of questions.

## 2014-07-24 NOTE — Telephone Encounter (Signed)
Left Vm for patient and encouraged her to wait for the written report to ask questions about the further treatment of OSA.  i also addressed the report being send to Dr. Reubin Milan previous office location. CD

## 2014-07-24 NOTE — Assessment & Plan Note (Signed)
To see neuro, we discussed importance of wearing CPAP that it helps to prevent arrhthymias

## 2014-07-24 NOTE — Assessment & Plan Note (Signed)
Followed by Dr. Bubba Camp

## 2014-07-24 NOTE — Progress Notes (Signed)
07/24/2014   PCP: No primary care provider on file.   Chief Complaint  Patient presents with  . ROV 6 months    C/o racing heart beat, chest pain, occas shortness of breath at rest.    Primary Cardiologist:Dr. Adora Fridge   HPI: 63 y.o. female who is followed by Dr. Adora Fridge. He  last saw her in June. She has a past medical history significant for DM, HTN, HLD, Myasthenia Gravis and thyroid cancer, s/p thyroidectomy. She also has a family history of heart disease. Her sister has CAD and underwent coronary stenting x 1. Both parents had CHF. The patient underwent a diagnostic LHC in 2012, in the setting of ongoing chest pain. The procedure was actually performed by Dr. Daneen Schick, of [facility], and demonstrated normal coronaries and normal LV function.  Patient underwent LexiScan myoview stress test which was negative with episode of chest pain.  She has a hx. of thyroid cancer and had her last round of radiation in September.  She tells me today it is back.  She is followed by Dr. Bubba Camp. Her last 2D echo was 02/2013 and was essentially normal except for grade one diastolic dysfunction.   She has episodes of tachycardia ST on event monitor up to rates of 130. She was placed on Lopressor 12.37m BID we have been titrating the dose according to symptoms.  This apparently had helped reduce the frequency.   Today she is here for routine follow up and is having rapid HR daily.  She is currently on Lopressor 75 mg BID.  Her BP is stable.  She has no chest pain and mild SOB that is chronic.  She also tells me her thyroid cancer is back.  This may be affecting her thyroid causing more tachycardia.  She has been dx. With sleep apnea.  To see Dr. DBeacher Mayfor this.  Also with her MG, she is beginning to have difficulty swallowing with liquids and solids.      Allergies  Allergen Reactions  . Contrast Media [Iodinated Diagnostic Agents] Anaphylaxis  . Fluorescein Shortness Of  Breath    dye  . Iodine Anaphylaxis    Contrast dye - iodine  . Codeine Hives and Nausea Only    Current Outpatient Prescriptions  Medication Sig Dispense Refill  . Alcohol Swabs (B-D SINGLE USE SWABS REGULAR) PADS     . ALPRAZolam (XANAX) 0.5 MG tablet TAKE 1 TABLET BY MOUTH AT BEDTIME AS NEEDED FOR ANXIETY 30 tablet 5  . B-D ULTRAFINE III SHORT PEN 31G X 8 MM MISC     . BUTRANS 10 MCG/HR PTWK patch     . BYDUREON 2 MG SUSR Inject 2 mg as directed once a week.     . cyclobenzaprine (FLEXERIL) 10 MG tablet     . cycloSPORINE (RESTASIS) 0.05 % ophthalmic emulsion Place 1 drop into both eyes 2 (two) times daily.    .Marland Kitchendexamethasone (DECADRON) 0.1 % ophthalmic solution     . diclofenac sodium (VOLTAREN) 1 % GEL Apply 2 g topically 2 (two) times daily.    .Marland Kitchenesomeprazole (NEXIUM) 40 MG capsule Take 40 mg by mouth 2 (two) times daily.    . folic acid-pyridoxine-cyancobalamin (FOLTX) 2.5-25-2 MG TABS Take 1 tablet by mouth daily.    .Marland Kitchenibuprofen (ADVIL,MOTRIN) 800 MG tablet     . Insulin Disposable Pump (V-GO 30) KIT     . insulin lispro (HUMALOG) 100 UNIT/ML injection  Inject 100 Units into the skin 3 (three) times daily before meals.    Marland Kitchen L-Methylfolate-Algae-B12-B6 (METANX) 3-90.314-2-35 MG CAPS Take 1 tablet by mouth daily.    Marland Kitchen L-Methylfolate-B6-B12 (METANX PO) Take by mouth 2 (two) times daily.    Marland Kitchen levalbuterol (XOPENEX) 0.63 MG/3ML nebulizer solution     . LEVEMIR FLEXTOUCH 100 UNIT/ML Pen     . levothyroxine (SYNTHROID, LEVOTHROID) 175 MCG tablet Monday -Thursday 175mg. Friday-Sunday is 1559m.    . meclizine (ANTIVERT) 25 MG tablet Take 25 mg by mouth as needed.     . meloxicam (MOBIC) 15 MG tablet Take 15 mg by mouth daily.    . metFORMIN (GLUCOPHAGE-XR) 500 MG 24 hr tablet 500 mg 2 (two) times daily.     . metoprolol (LOPRESSOR) 100 MG tablet Take 1 tablet (100 mg total) by mouth 2 (two) times daily. 180 tablet 3  . mometasone (ELOCON) 0.1 % cream     . montelukast (SINGULAIR) 10  MG tablet Take 10 mg by mouth at bedtime.    . mycophenolate (CELLCEPT) 500 MG tablet Take 500 mg by mouth See admin instructions. Patient states she takes 3 tabs in the AM and 2 tab in the PM    . olmesartan (BENICAR) 20 MG tablet Take 20 mg by mouth daily.    . ONE TOUCH ULTRA TEST test strip     . potassium chloride SA (K-DUR,KLOR-CON) 20 MEQ tablet Take 20 mEq by mouth 2 (two) times daily.     . predniSONE (DELTASONE) 10 MG tablet Take 10 mg by mouth 2 (two) times daily.    . Marland KitchenROAIR HFA 108 (90 BASE) MCG/ACT inhaler     . Suvorexant (BELSOMRA) 15 MG TABS Take 15 mg by mouth Nightly. 6 tablet 0  . SYNTHROID 150 MCG tablet Take 1 tablet by mouth 3 (three) times a week. Friday, Saturday, Sunday    . tobramycin (TOBREX) 0.3 % ophthalmic solution     . traMADol (ULTRAM) 50 MG tablet Take 50 mg by mouth as needed.     . Vitamin D, Ergocalciferol, (DRISDOL) 50000 UNITS CAPS capsule Take 50,000 Units by mouth 2 (two) times a week.     . zolpidem (AMBIEN) 5 MG tablet Take 1 tablet by mouth at bedtime as needed.     No current facility-administered medications for this visit.    Past Medical History  Diagnosis Date  . Corns and callosities   . Difficulty in walking(719.7)   . Hypertension   . Myasthenia gravis   . High cholesterol   . Heart murmur   . Chest pain at rest     "woke me up in my sleep" (03/13/2013)  . Asthma   . Shortness of breath     "can happen at anytime" (03/13/2013)  . Sleep apnea     "use to wear mask; cause of insurance purposes I'm not not" (03/13/2013)  . Hypothyroidism   . Type II diabetes mellitus   . H/O hiatal hernia   . GERD (gastroesophageal reflux disease)   . Arthritis     "all over my body" (03/13/2013)  . Thyroid carcinoma   . Myasthenia gravis     "in my eyes; diagnsosed > 7 yr ago" (03/13/2013)  . History of stress test 07/11/2010    compared to previous study there is no significant change, Normal Myocardial Perfusion study, this is a low risk scan    . Hx of echocardiogram 07/08/2010    EF> 55% normal 2D  Echo  . Palpitations     Past Surgical History  Procedure Laterality Date  . Total thyroidectomy    . Tonsillectomy    . Abdominal hysterectomy    . Appendectomy    . Cholecystectomy    . Carpal tunnel release Right   . Knee arthroscopy Left   . Cataract extraction w/ intraocular lens  implant, bilateral Bilateral   . Cardiac catheterization      "several" (03/13/2013)  . Anterior cervical decomp/discectomy fusion      "I've had severa ORs; always went in from the front" (03/13/2013)  . Shoulder arthroscopy w/ rotator cuff repair Left     ACQ:PEAKLTY:VD colds or fevers, + weight loss 10 lbs Skin:no rashes or ulcers HEENT:no blurred vision, no congestion CV:see HPI PUL:see HPI GI:no diarrhea constipation or melena, no indigestion GU:no hematuria, no dysuria MS:no joint pain, no claudication Neuro:no syncope, no lightheadedness Endo: diabetes improved control with pump, followed by Dr. Bubba Camp, + thyroid disease  Wt Readings from Last 3 Encounters:  07/24/14 167 lb 1.6 oz (75.796 kg)  06/01/14 178 lb (80.74 kg)  02/05/14 174 lb (78.926 kg)    PHYSICAL EXAM BP 140/70 mmHg  Pulse 93  Ht '5\' 2"'  (1.575 m)  Wt 167 lb 1.6 oz (75.796 kg)  BMI 30.56 kg/m2 General:Pleasant affect, NAD Skin:Warm and dry, brisk capillary refill HEENT:normocephalic, sclera clear, mucus membranes moist Neck:supple, no JVD, no bruits  Heart:S1S2 RRR without murmur, gallup, rub or click Lungs:clear without rales, rhonchi, or wheezes PBA:QVOH, non tender, + BS, do not palpate liver spleen or masses Ext:no lower ext edema, 2+ pedal pulses, 2+ radial pulses Neuro:alert and oriented, MAE, follows commands, + facial symmetry  EKG:SR no acute changes from 01/2014  ASSESSMENT AND PLAN Sinus tachycardia Still with episodes of tachycardia,  Now occurring daily.  She is having thyroid issues.  I am increasing her lopressor to 100 mg BID, she has  enough BP room.  Will send today's note to Dr. Bubba Camp, also will have pt follow up with Dr. Adora Fridge in 2 months to make sure she has improved control with her tachycardia.   OSA (obstructive sleep apnea) To see neuro, we discussed importance of wearing CPAP that it helps to prevent arrhthymias   DM (diabetes mellitus) type II uncontrolled with eye manifestation Followed by Dr. Bubba Camp

## 2014-07-27 ENCOUNTER — Encounter: Payer: Self-pay | Admitting: Neurology

## 2014-07-27 ENCOUNTER — Ambulatory Visit (INDEPENDENT_AMBULATORY_CARE_PROVIDER_SITE_OTHER): Payer: 59 | Admitting: Neurology

## 2014-07-27 VITALS — BP 136/68 | HR 88 | Resp 18

## 2014-07-27 DIAGNOSIS — G7000 Myasthenia gravis without (acute) exacerbation: Secondary | ICD-10-CM

## 2014-07-27 DIAGNOSIS — Z0289 Encounter for other administrative examinations: Secondary | ICD-10-CM

## 2014-07-27 DIAGNOSIS — C73 Malignant neoplasm of thyroid gland: Secondary | ICD-10-CM | POA: Insufficient documentation

## 2014-07-27 DIAGNOSIS — R0902 Hypoxemia: Secondary | ICD-10-CM

## 2014-07-27 DIAGNOSIS — G4733 Obstructive sleep apnea (adult) (pediatric): Secondary | ICD-10-CM

## 2014-07-27 DIAGNOSIS — G4701 Insomnia due to medical condition: Secondary | ICD-10-CM

## 2014-07-27 MED ORDER — BELSOMRA 15 MG PO TABS: 15.0000 mg | ORAL_TABLET | Freq: Every evening | ORAL | Status: DC

## 2014-07-27 MED ORDER — ALPRAZOLAM 0.5 MG PO TABS: ORAL_TABLET | ORAL | Status: DC

## 2014-07-27 NOTE — Progress Notes (Signed)
SLEEP MEDICINE CLINIC   Provider:  Larey Seat, M D  Referring Provider: [place] Physician:    PA at [facility], [address], [place] .    HPI:  [name] is a 63 y.o.retired, afro-american, right handed female , who is seen here today in a revisit;  She is a former patient of Dr [name]., and her PA had referred her for a sleep evaluation ( [facility]).   The patient describes that she has insomnia problems she falls asleep but she cannot stay asleep. She will sleep for 2-3  hours and then will wake up. She says that she has great trouble to reinitiate sleep and often these 3 hours at night may be all she gets. Sometimes she wakes from pain related to her diabetic peripheral neuropathy it seems to be mostly in her left foot and left anterior thigh. She states that her insomnia seems to have going on for several months now, likely about a year. The patient had an inconclusive sleep study a year ago at the sleep Center at [place] had no advanced knowlegde  nor record of this.  She did not sleep in the lab,  she reports .  She was told the study will need to be repeated. Sleep habits. 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   The patient usually goes to bed around 11:00 but she reports that it will take her up to 2 hours to fall asleep before she then sleeps for only 3 hours or so. She has nocturia related to diabetes which has further fragmented sleep. She sleeps alone but family and get stronger holes have reported that she snores loudly. She sleeps in a cool, prior to and dark bedroom but she can't is comfortable. She does not watch TV in bed. She spontaneously wakes up at 7 in the morning traditionally for many years and she rises at 7:30. She does not drink coffee or caffeinated beverages. She regularly eats breakfast. Besides the diabetic neuropathy she also has eye disease and ptosis,  diplopia  related to myasthenia gravis. She no longer drives at night time because of her vision impairment.   She has been using methylated Vitamin J62 and Folic Acid to help with her diabetic neuropathy .She also has listed  Tramadol, Ambien, Tobrex eyedrops, prednisone, Benicar, CellCept, Singulair, Lopressor, Glucophage, Mobic, Synthroid, Xopenex and Humalog insulin.   The patient has a history of thyroid cancer arthritis, asthma,  corneal abrasion, GERD, diabetes, high cholesterol and a history of thyroid cancer. She is followed for Myasthenia gravis by Dr. Nanine [name] at [facility].   Interval history; 07-27-14. [name] is seen here today to discuss the results of her sleep study. We found very mild sleep apnea mainly consistent of hypoxemia, or shallow breathing spells. There was a clear accentuation in REM sleep which further indicates that and lower muscle tone is responsible for the apnea. Oxygen desaturation at nadir was 75% but the total time of desaturation was even more concerning 135.4 minutes in toto of the sleep time recorded. She did not have CO2 retention her No graft measured 42.2 or during REM. There were no periodic limb movements. Her heart rate was regular in normal sinus rhythm.  My recommendations are based on the mild REM and positional dependent obstructive sleep apnea that CPAP only will address the hypoxemia if it is REM related. A dental device  usually doesn't cover this but she will have some benefit by sleeping on the side and avoiding supine sleep position. Weight loss is also highly recommended. Given that the patient has a history of myasthenia gravis I am concerned that the prolonged hypoxemia at night is a affect of her neuromuscular weakness. The patient was clearly not in some neck she slept actually for the total recording time. I believe that her high degree of fatigue is related to hypoxemia rather than apnea. To prescribe oxygen supplementation I need to refer this  patient for pulmonary function tests and pulse oximetry during activity. I cannot do this in my office.  Review of Systems: Out of a complete 14 system review, the patient complains of only the following symptoms, and all other reviewed systems are negative. She reports difficulties with swallowing ,  Solids and liquids. Ptosis , Diplopia and blurred vision.  Epworth score 2, up form last visits 1 point , Fatigue severity score  46 from 43  , depression score zero.    History   Social History  . Marital Status: Single    Spouse Name: N/A    Number of Children: 0  . Years of Education: college   Occupational History  . retired    Social History Main Topics  . Smoking status: Never Smoker   . Smokeless tobacco: Never Used  . Alcohol Use: No  . Drug Use: No  . Sexual Activity: No   Other Topics Concern  . Not on file   Social History Narrative    Family History  Problem Relation Age of Onset  . Coronary artery disease Sister     s/p coronary stenting  . Congestive Heart Failure Mother   . Congestive Heart Failure Father   . Lung cancer Father     Past Medical History  Diagnosis Date  . Corns and callosities   . Difficulty in walking(719.7)   . Hypertension   . Myasthenia gravis   . High cholesterol   . Heart murmur   . Chest pain at rest     "woke me up in my sleep" (03/13/2013)  . Asthma   . Shortness of breath     "can happen at anytime" (03/13/2013)  . Sleep apnea     "use to wear mask; cause of insurance purposes I'm not not" (03/13/2013)  . Hypothyroidism   . Type II diabetes mellitus   . H/O hiatal hernia   . GERD (gastroesophageal reflux disease)   . Arthritis     "all over my body" (03/13/2013)  . Thyroid carcinoma   . Myasthenia gravis     "in my eyes; diagnsosed > 7 yr ago" (03/13/2013)  . History of stress test 07/11/2010    compared to previous study there is no significant change, Normal Myocardial Perfusion study, this is a low risk scan  . Hx  of echocardiogram 07/08/2010    EF> 55% normal 2D Echo  . Palpitations     Past Surgical History  Procedure Laterality Date  . Total thyroidectomy    . Tonsillectomy    . Abdominal hysterectomy    . Appendectomy    . Cholecystectomy    . Carpal tunnel release Right   . Knee arthroscopy Left   . Cataract extraction w/ intraocular lens  implant, bilateral Bilateral   . Cardiac catheterization      "several" (03/13/2013)  . Anterior cervical decomp/discectomy fusion      "I've had severa ORs; always went in  from the front" (03/13/2013)  . Shoulder arthroscopy w/ rotator cuff repair Left     Current Outpatient Prescriptions  Medication Sig Dispense Refill  . Alcohol Swabs (B-D SINGLE USE SWABS REGULAR) PADS     . ALPRAZolam (XANAX) 0.5 MG tablet Prn use for insomnia, do not use more than one tab- po. 30 tablet 5  . B-D ULTRAFINE III SHORT PEN 31G X 8 MM MISC     . BELSOMRA 15 MG TABS Take 15 mg by mouth Nightly. 30 tablet 0  . BUTRANS 10 MCG/HR PTWK patch     . BYDUREON 2 MG SUSR Inject 2 mg as directed once a week.     . cyclobenzaprine (FLEXERIL) 10 MG tablet     . cycloSPORINE (RESTASIS) 0.05 % ophthalmic emulsion Place 1 drop into both eyes 2 (two) times daily.    Marland Kitchen dexamethasone (DECADRON) 0.1 % ophthalmic solution     . diclofenac sodium (VOLTAREN) 1 % GEL Apply 2 g topically 2 (two) times daily.    Marland Kitchen esomeprazole (NEXIUM) 40 MG capsule Take 40 mg by mouth 2 (two) times daily.    . folic acid-pyridoxine-cyancobalamin (FOLTX) 2.5-25-2 MG TABS Take 1 tablet by mouth daily.    Marland Kitchen ibuprofen (ADVIL,MOTRIN) 800 MG tablet     . Insulin Disposable Pump (V-GO 30) KIT     . insulin lispro (HUMALOG) 100 UNIT/ML injection Inject 100 Units into the skin 3 (three) times daily before meals.    Marland Kitchen L-Methylfolate-Algae-B12-B6 (METANX) 3-90.314-2-35 MG CAPS Take 1 tablet by mouth daily.    Marland Kitchen L-Methylfolate-B6-B12 (METANX PO) Take by mouth 2 (two) times daily.    Marland Kitchen levalbuterol (XOPENEX) 0.63  MG/3ML nebulizer solution     . LEVEMIR FLEXTOUCH 100 UNIT/ML Pen     . levothyroxine (SYNTHROID, LEVOTHROID) 175 MCG tablet Monday -Thursday 124mg. Friday-Sunday is 1534m.    . meclizine (ANTIVERT) 25 MG tablet Take 25 mg by mouth as needed.     . meloxicam (MOBIC) 15 MG tablet Take 15 mg by mouth daily.    . metFORMIN (GLUCOPHAGE-XR) 500 MG 24 hr tablet 500 mg 2 (two) times daily.     . metoprolol (LOPRESSOR) 100 MG tablet Take 1 tablet (100 mg total) by mouth 2 (two) times daily. 180 tablet 3  . mometasone (ELOCON) 0.1 % cream     . montelukast (SINGULAIR) 10 MG tablet Take 10 mg by mouth at bedtime.    . mycophenolate (CELLCEPT) 500 MG tablet Take 500 mg by mouth See admin instructions. Patient states she takes 3 tabs in the AM and 2 tab in the PM    . olmesartan (BENICAR) 20 MG tablet Take 20 mg by mouth daily.    . ONE TOUCH ULTRA TEST test strip     . potassium chloride SA (K-DUR,KLOR-CON) 20 MEQ tablet Take 20 mEq by mouth 2 (two) times daily.     . predniSONE (DELTASONE) 10 MG tablet Take 10 mg by mouth 2 (two) times daily.    . Marland KitchenROAIR HFA 108 (90 BASE) MCG/ACT inhaler     . SYNTHROID 150 MCG tablet Take 1 tablet by mouth 3 (three) times a week. Friday, Saturday, Sunday    . tobramycin (TOBREX) 0.3 % ophthalmic solution     . traMADol (ULTRAM) 50 MG tablet Take 50 mg by mouth as needed.     . Vitamin D, Ergocalciferol, (DRISDOL) 50000 UNITS CAPS capsule Take 50,000 Units by mouth 2 (two) times a week.     .Marland Kitchen  zolpidem (AMBIEN) 5 MG tablet Take 1 tablet by mouth at bedtime as needed.     No current facility-administered medications for this visit.    Allergies as of 07/27/2014 - Review Complete 07/24/2014  Allergen Reaction Noted  . Contrast media [iodinated diagnostic agents] Anaphylaxis 03/13/2013  . Fluorescein Shortness Of Breath 03/19/2014  . Iodine Anaphylaxis 03/19/2014  . Codeine Hives and Nausea Only 02/27/2013    Vitals: BP 136/68 mmHg  Pulse 88  Resp 18 Last  Weight:  Wt Readings from Last 1 Encounters:  07/24/14 167 lb 1.6 oz (75.796 kg)       Last Height:   Ht Readings from Last 1 Encounters:  07/24/14 '5\' 2"'  (1.575 m)    THIS PATIENT HAS AN INSULIN PUMP.   Physical exam:  General: The patient is awake, alert and appears not in acute distress. The patient is well groomed. Head: Normocephalic, atraumatic. Neck is supple. Mallampati 4, myasthenic delayed pharyngeal response, dysphagia.   neck circumference: . Nasal airflow 15.25  , TMJ is  Described, claudication and click . Retrognathia is seen.  Cardiovascular:  Regular rate and rhythm , without  murmurs or carotid bruit, and without distended neck veins. Respiratory: Lungs are clear to auscultation. Skin:  Without evidence of edema, or rash Trunk: BMI is  elevated and patient  has normal posture.  Neurologic exam : The patient is awake and alert, oriented to place and time.   Memory subjective described as intact.  There is a normal attention span & concentration ability. Speech is fluent without dysarthria, dysphonia or aphasia. Mood and affect are appropriate.  Cranial nerves: Pupils are equal and briskly reactive to light. Funduscopic exam with evidence of pallor, laser surgery, cotton wool.  Severe PTOSIS bilaterally.  Both eyelids cover the upper half of iris and Pupil. She has floaters,  Visual (poor )acuity 20-200 OD and 20-100 OS. Extraocular movements  in vertical and horizontal planes intact and without nystagmus. Visual fields by finger perimetry are intact. Hearing to finger rub intact.   Facial sensation intact to fine touch. Facial motor strength is symmetric and tongue and uvula move midline. She reports difficulties with swallowing ,  Solids and liquids.   Motor exam:  muscle bulk and symmetric strength in all extremities.  Sensory:  Meralgia paresthetica left anterior thigh pain and dysesthesias.  Fine touch, pinprick and vibration were tested in all  extremities. Loss of vibration in toes, felt some in ankles.  Proprioception is tested in the upper extremities only.  This was normal.  Coordination: Rapid alternating movements in the fingers/hands is normal.  Finger-to-nose maneuver normal without evidence of ataxia, dysmetria or tremor.  Gait and station:  Patient walks without assistive device and is able unassisted to climb up to the exam table. Strength within normal limits. Stance is wide based , stable.  Tandem gait is mildly ataxic.  Romberg testing is negative.  Deep tendon reflexes: in the  upper and lower extremities are absent - no patella and no achilles tendon reflex.  Babinski maneuver response is downgoing.   Assessment:  After physical and neurologic examination, review of laboratory studies, imaging, neurophysiology testing and pre-existing records, assessment is :  This patient has a high risk of  hypoventilation and has only mild OSA from her neuromuscular condition.  I would like for her to see a pulmonologist ASAP to get PFT and possible oxygen prescription. The sleep study will be attached. Please note that the patient in the process of changing  PCPs.  Avoid supine sleep- Tennis ball method explained.   She is diabetic and has pain and nocturia related  sleep fragmentation on top. She slept well during her night in the sleep lab,  Her insomnia was not longer responsive to Ambien at 20 mg at night, and she tried xanax 0.25 with good success. .  RLS, painful sensory neuropathy and meralgia paresthetica are unchanged .     The patient was advised of the nature of the diagnosed sleep disorder ( hypoxemia, hypoventilation without Co2 retention)   ,the treatment options and risks for general a health and wellness arising from not treating the condition. Visit duration was 30 minutes.   Plan:  Treatment plan and additional workup : Ordered Belsomra as a non respiratory supressant sleep aid. Order xanax 0.5 mg prn  anxiety. D/c ambien. Ordered auto titration CPAP , will need ONO on CPAP within the first 30 days. [facility].    Referral to pulmonology,  cc to dr [name] at [facility].  Attach sleep study to both offices, [facility] and pulmonology.    Asencion Partridge Shirl Ludington MD  07/27/2014

## 2014-08-13 ENCOUNTER — Ambulatory Visit (INDEPENDENT_AMBULATORY_CARE_PROVIDER_SITE_OTHER): Payer: 59 | Admitting: Pulmonary Disease

## 2014-08-13 ENCOUNTER — Encounter: Payer: Self-pay | Admitting: Pulmonary Disease

## 2014-08-13 VITALS — BP 110/64 | HR 104 | Temp 97.6°F | Ht 63.0 in | Wt 168.6 lb

## 2014-08-13 DIAGNOSIS — R0600 Dyspnea, unspecified: Secondary | ICD-10-CM | POA: Insufficient documentation

## 2014-08-13 NOTE — Patient Instructions (Signed)
Will schedule for breathing studies, along with measuring of muscle pressures. Will followup your oxygen study overnight.  Do not do the study unless you are able to wear your bilevel device.  Will call you with results of your study.

## 2014-08-13 NOTE — Assessment & Plan Note (Signed)
The patient has a history of myasthenia gravis, and feels that her symptoms have worsened over the last 6 months. She has been found to have mild obstructive sleep apnea, and is currently on bilevel with good control of her AHI. The question has been raised whether her neuromuscular weakness may be affecting her pulmonary status during sleep and also during the day. We can certainly use her bilevel as a respiratory assist device if needed.  She does have chronic dyspnea on exertion, and currently can get winded walking through her house or trying to go up one flight of stairs. She has no significant cough or other pulmonary symptoms. Will schedule the patient for full pulmonary function studies as well as muscle pressures. It should be noted that her end-tidal CO2 did increase during her sleep study, but only to a mild degree. Also, this was not calibrated with arterial blood gas measurements.

## 2014-08-13 NOTE — Progress Notes (Signed)
   Subjective:    Patient ID: [name], female    DOB: [birth year], 63 y.o.   MRN: [id number]  HPI The patient is a 63 year old female who I've been asked to see for a pulmonary evaluation in the setting of myasthenia gravis. The patient is being treated by a specialist at [facility], and has been having issues with high drooping and also evidence for bulbar dysfunction. She has had a sleep study recently which showed mild OSA, and she is currently on bilevel for treatment with excellent control of her AHI by the download. She did have an elevation in her end-tidal CO2 during sleep, but was not overly significant. The patient has been wearing bilevel compliantly, but is complicated by ongoing insomnia issues. She is scheduled to have overnight oximetry in the next day or 2. The patient has chronic dyspnea on exertion, but currently will get winded walking through her house or trying to walk up a flight of stairs. She does not feel she has limitation of her chest excursion. She denies any significant cough or mucus production.   Review of Systems  Constitutional: Positive for appetite change and unexpected weight change. Negative for fever.  HENT: Negative for congestion, dental problem, ear pain, nosebleeds, postnasal drip, rhinorrhea, sinus pressure, sneezing, sore throat and trouble swallowing.   Eyes: Negative for redness and itching.  Respiratory: Positive for shortness of breath. Negative for cough, chest tightness and wheezing.   Cardiovascular: Positive for palpitations. Negative for leg swelling.  Gastrointestinal: Negative for nausea and vomiting.  Genitourinary: Negative for dysuria.  Musculoskeletal: Negative for joint swelling.  Skin: Negative for rash.  Neurological: Positive for headaches.  Hematological: Does not bruise/bleed easily.  Psychiatric/Behavioral: Negative for dysphoric mood. The patient is not nervous/anxious.        Objective:   Physical Exam Constitutional:  Well  developed, no acute distress  HENT:  Nares patent without discharge  Oropharynx without exudate, palate and uvula are normal  Eyes:  Perrla, eomi, no scleral icterus  Neck:  No JVD, no TMG  Cardiovascular:  Mild tachy, regular rhythm, no rubs or gallops.  2/6 sem        Intact distal pulses  Pulmonary :  Normal breath sounds, no stridor or respiratory distress   No rales, rhonchi, or wheezing  Abdominal:  Soft, nondistended, bowel sounds present.  No tenderness noted.   Musculoskeletal:  mild lower extremity edema noted.  Lymph Nodes:  No cervical lymphadenopathy noted  Skin:  No cyanosis noted  Neurologic:  Alert, appropriate, moves all 4 extremities but does have LE weakness.          Assessment & Plan:

## 2014-08-14 ENCOUNTER — Ambulatory Visit (HOSPITAL_COMMUNITY)
Admission: RE | Admit: 2014-08-14 | Discharge: 2014-08-14 | Disposition: A | Discharge status: Home / Self Care | Payer: 59 | Source: Ambulatory Visit | Attending: Pulmonary Disease | Admitting: Pulmonary Disease

## 2014-08-14 DIAGNOSIS — R0609 Other forms of dyspnea: Secondary | ICD-10-CM | POA: Insufficient documentation

## 2014-08-14 DIAGNOSIS — G7000 Myasthenia gravis without (acute) exacerbation: Secondary | ICD-10-CM | POA: Insufficient documentation

## 2014-08-14 DIAGNOSIS — R0600 Dyspnea, unspecified: Secondary | ICD-10-CM

## 2014-08-14 LAB — PULMONARY FUNCTION TEST
DL/VA % pred: 102 %
DL/VA: 4.78 ml/min/mmHg/L
DLCO UNC: 18.12 ml/min/mmHg
DLCO unc % pred: 79 %
FEF 25-75 Post: 3.49 L/sec
FEF 25-75 Pre: 2.65 L/sec
FEF2575-%Change-Post: 31 %
FEF2575-%Pred-Post: 179 %
FEF2575-%Pred-Pre: 136 %
FEV1-%CHANGE-POST: 4 %
FEV1-%PRED-POST: 109 %
FEV1-%PRED-PRE: 104 %
FEV1-POST: 2.15 L
FEV1-Pre: 2.06 L
FEV1FVC-%CHANGE-POST: 6 %
FEV1FVC-%Pred-Pre: 106 %
FEV6-%CHANGE-POST: -1 %
FEV6-%Pred-Post: 99 %
FEV6-%Pred-Pre: 101 %
FEV6-PRE: 2.45 L
FEV6-Post: 2.41 L
FEV6FVC-%Change-Post: 0 %
FEV6FVC-%PRED-PRE: 104 %
FEV6FVC-%Pred-Post: 104 %
FVC-%CHANGE-POST: -1 %
FVC-%PRED-PRE: 97 %
FVC-%Pred-Post: 96 %
FVC-Post: 2.41 L
FVC-Pre: 2.46 L
PRE FEV1/FVC RATIO: 84 %
PRE FEV6/FVC RATIO: 100 %
Post FEV1/FVC ratio: 89 %
Post FEV6/FVC ratio: 100 %
RV % PRED: 72 %
RV: 1.43 L
TLC % PRED: 79 %
TLC: 3.92 L

## 2014-08-14 MED ORDER — ALBUTEROL SULFATE (2.5 MG/3ML) 0.083% IN NEBU
2.5000 mg | INHALATION_SOLUTION | Freq: Once | RESPIRATORY_TRACT | Status: AC
  Administered 2014-08-14: 2.5 mg via RESPIRATORY_TRACT

## 2014-08-15 ENCOUNTER — Telehealth: Payer: Self-pay | Admitting: Pulmonary Disease

## 2014-08-15 NOTE — Telephone Encounter (Signed)
Pt aware of results, aware that we will relay ono results as we have them.  Nothing further needed.

## 2014-08-15 NOTE — Telephone Encounter (Signed)
Notes Recorded by Inge Rise, CMA on 08/14/2014 at 2:38 PM [id number] x1 Notes Recorded by Kathee Delton, MD on 08/14/2014 at 1:56 PM PFT"s reveal no obstruction, and essential normal lung volumes and DLCO (minimally decreased). Her inspiratory pressure is mildly reduced, and expiratory pressure is severely reduced (indicating diaphragm/abdominals doing Ok, other skeletal muscles weak?). The muscle pressures are very effort dependent.   Mindy, let pt know that her breathing studies look pretty good. She does have some weakness on exhalation. I do not think her myasthenia is the main cause of her shortness of breath with activity. Suspect is related to her deconditioning and weight. Await the results of her overnight oxygen study. Have her let us know when this is done, so we can track down the results.   lmtcb for pt.

## 2014-08-22 ENCOUNTER — Encounter: Payer: Self-pay | Admitting: Neurology

## 2014-08-29 ENCOUNTER — Telehealth: Payer: Self-pay | Admitting: Neurology

## 2014-08-29 NOTE — Telephone Encounter (Signed)
Patient is calling for results from oxygen test done with [facility],has called their office and they said that results had been sent to physician for review.

## 2014-08-31 ENCOUNTER — Telehealth: Payer: Self-pay | Admitting: Neurology

## 2014-08-31 NOTE — Telephone Encounter (Signed)
Called the patient and reviewed the results from her overnight pulse oximetry.  She is aware that there was no finding of prolonged or significant hypoxemia.  She understands that she has no need for any type of supplemental O2.  She will follow with Dr. Brett Fairy as appropriate.

## 2014-09-04 ENCOUNTER — Encounter: Payer: Self-pay | Admitting: Neurology

## 2014-09-17 ENCOUNTER — Encounter: Payer: Self-pay | Admitting: Neurology

## 2014-09-19 ENCOUNTER — Ambulatory Visit (INDEPENDENT_AMBULATORY_CARE_PROVIDER_SITE_OTHER): Payer: 59 | Admitting: Neurology

## 2014-09-19 ENCOUNTER — Encounter: Payer: Self-pay | Admitting: Neurology

## 2014-09-19 VITALS — BP 145/80 | HR 94 | Resp 16 | Ht 64.0 in | Wt 163.5 lb

## 2014-09-19 DIAGNOSIS — E1140 Type 2 diabetes mellitus with diabetic neuropathy, unspecified: Secondary | ICD-10-CM

## 2014-09-19 DIAGNOSIS — G4733 Obstructive sleep apnea (adult) (pediatric): Secondary | ICD-10-CM

## 2014-09-19 DIAGNOSIS — E1149 Type 2 diabetes mellitus with other diabetic neurological complication: Secondary | ICD-10-CM

## 2014-09-19 DIAGNOSIS — G7000 Myasthenia gravis without (acute) exacerbation: Secondary | ICD-10-CM | POA: Insufficient documentation

## 2014-09-19 DIAGNOSIS — Z9989 Dependence on other enabling machines and devices: Principal | ICD-10-CM

## 2014-09-19 NOTE — Progress Notes (Signed)
SLEEP MEDICINE CLINIC   Provider:  Larey Seat, M D  Referring Provider: [place] Physician:    PA at [facility], [address], [place] .    HPI:  [name] is a 64 y.o.retired, afro-american, right handed female , who is seen here today in a revisit;  She is a former patient of Dr Karlton Lemon., and her PA had referred her for a sleep evaluation ( [facility]).   The patient describes that she has insomnia problems she falls asleep but she cannot stay asleep. She will sleep for 2-3  hours and then will wake up. She says that she has great trouble to reinitiate sleep and often these 3 hours at night may be all she gets. Sometimes she wakes from pain related to her diabetic peripheral neuropathy it seems to be mostly in her left foot and left anterior thigh. She states that her insomnia seems to have going on for several months now, likely about a year. The patient had an inconclusive sleep study a year ago at the sleep Center at [place] had no advanced knowlegde  nor record of this.  She did not sleep in the lab,  she reports .  She was told the study will need to be repeated. Sleep habits. 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   The patient usually goes to bed around 11:00 but she reports that it will take her up to 2 hours to fall asleep before she then sleeps for only 3 hours or so. She has nocturia related to diabetes which has further fragmented sleep. She sleeps alone but family and get stronger holes have reported that she snores loudly. She sleeps in a cool, prior to and dark bedroom but she can't is comfortable. She does not watch TV in bed. She spontaneously wakes up at 7 in the morning traditionally for many years and she rises at 7:30. She does not drink coffee or caffeinated beverages. She regularly eats breakfast. Besides the diabetic neuropathy she also has eye disease and ptosis,  diplopia  related to myasthenia gravis. She no longer drives at night time because of her vision impairment.   The patient has  thyroid cancer,  arthritis, asthma,  corneal abrasion, GERD, diabetes, high cholesterol and a history of thyroid cancer. She is followed for Myasthenia gravis by Dr. Nanine Means at [facility].   Interval history; 07-27-14. [name] is seen here today to discuss the results of her sleep study. We found very mild sleep apnea mainly consistent of hypoxemia, or shallow breathing spells. There was a clear accentuation in REM sleep which further indicates that and lower muscle tone is responsible for the apnea. Oxygen desaturation at nadir was 75% but the total time of desaturation was even more concerning 135.4 minutes in toto of the sleep time recorded. She did not have CO2 retention her No graft measured 42.2 or during REM. There were no periodic limb movements. Her heart rate was regular in normal sinus rhythm.My recommendations are based on the mild REM and positional dependent obstructive sleep apnea that CPAP only will address the hypoxemia if it is REM related. A dental device usually doesn't cover this but she will have some benefit by sleeping on the side and avoiding supine sleep position. Weight loss is also highly recommended. Given that the patient has a history of myasthenia gravis I am concerned that the prolonged  hypoxemia at night is a affect of her neuromuscular weakness. The patient was clearly not in some neck she slept actually for the total recording time. I believe that her high degree of fatigue is related to hypoxemia rather than apnea. To prescribe oxygen supplementation I need to refer this patient for pulmonary function tests and pulse oximetry during activity. I cannot do this in my office.Ordered Belsomra as a non respiratory supressant sleep aid. Order xanax 0.5 mg prn anxiety. D/c ambien. Ordered auto titration CPAP , will need ONO on CPAP within the first 30 days.  [facility]. Referral to pulmonology,  cc to dr Nanine Means at [facility].  Attach sleep study to both offices, [facility] and pulmonology.   09-19-14 Interval history the patient has done excellent with her CPAP compliance she has a compliance 400% of the last 30 days 25 days over 4 hours of nightly use equaling 83% compliance. Her average daily use is 5 hours and 21 minutes. The machine is set between 5 and 10 cm as an out told that with an EPR of 3 cm water residual AHI is 0.4 air leaks are small 91st percentile pressure is 8.6 cm water no evidence of central apnea emerging. Fatigue score is much reduced to 29 points Epworth sleepiness score to one point. She reports that her nose hurts from the CPAP use and that she has a very dry nostril. I will give her today a brochure about a CPAP gel that keeps and nostrils moist. I also will be happy to refill Belsomra as a sleep aid to be taken as needed. She continues to follow for myasthenia gravis with Dr. Nanine Means.      Review of Systems: 09-19-14 Out of a complete 14 system review, the patient complains of only the following symptoms, and all other reviewed systems are negative. She reports difficulties with swallowing ,  Solids and liquids. Ptosis , Diplopia and blurred vision.  Dry nose. She reports difficulties with swallowing ,  Solids and liquids.    Epworth score 1, , Fatigue severity score  29 from 46 , depression score zero.    Past Medical History  Diagnosis Date  . Corns and callosities   . Difficulty in walking(719.7)   . Hypertension   . Myasthenia gravis   . High cholesterol   . Heart murmur   . Chest pain at rest     "woke me up in my sleep" (03/13/2013)  . Asthma   . Shortness of breath     "can happen at anytime" (03/13/2013)  . Sleep apnea     "use to wear mask; cause of insurance purposes I'm not not" (03/13/2013)  . Hypothyroidism   . Type II diabetes mellitus   . H/O hiatal hernia   . GERD (gastroesophageal reflux disease)   . Arthritis      "all over my body" (03/13/2013)  . Thyroid carcinoma   . Myasthenia gravis     "in my eyes; diagnsosed > 7 yr ago" (03/13/2013)  . History of stress test 07/11/2010    compared to previous study there is no significant change, Normal Myocardial Perfusion study, this is a low risk scan  . Hx of echocardiogram 07/08/2010    EF> 55% normal 2D Echo  . Palpitations     Past Surgical History  Procedure Laterality Date  . Total thyroidectomy    . Tonsillectomy    . Abdominal hysterectomy    . Appendectomy    . Cholecystectomy    .  Carpal tunnel release Right   . Knee arthroscopy Left   . Cataract extraction w/ intraocular lens  implant, bilateral Bilateral   . Cardiac catheterization      "several" (03/13/2013)  . Anterior cervical decomp/discectomy fusion      "I've had severa ORs; always went in from the front" (03/13/2013)  . Shoulder arthroscopy w/ rotator cuff repair Left     Current Outpatient Prescriptions  Medication Sig Dispense Refill  . Alcohol Swabs (B-D SINGLE USE SWABS REGULAR) PADS     . ALPRAZolam (XANAX) 0.5 MG tablet Prn use for insomnia, do not use more than one tab- po. 30 tablet 5  . B-D ULTRAFINE III SHORT PEN 31G X 8 MM MISC     . BELSOMRA 15 MG TABS Take 15 mg by mouth Nightly. 30 tablet 0  . BUTRANS 10 MCG/HR PTWK patch as needed.     Marland Kitchen BYDUREON 2 MG SUSR Inject 2 mg as directed once a week.     . cyclobenzaprine (FLEXERIL) 10 MG tablet 3 (three) times daily as needed.     . cycloSPORINE (RESTASIS) 0.05 % ophthalmic emulsion Place 1 drop into both eyes 2 (two) times daily.    . diclofenac sodium (VOLTAREN) 1 % GEL Apply 2 g topically 2 (two) times daily.    Marland Kitchen esomeprazole (NEXIUM) 40 MG capsule Take 40 mg by mouth 2 (two) times daily.    . folic acid-pyridoxine-cyancobalamin (FOLTX) 2.5-25-2 MG TABS Take 1 tablet by mouth daily.    Marland Kitchen ibuprofen (ADVIL,MOTRIN) 800 MG tablet as needed.     . Insulin Disposable Pump (V-GO 30) KIT     . L-Methylfolate-Algae-B12-B6  (METANX) 3-90.314-2-35 MG CAPS Take 1 tablet by mouth daily.    Marland Kitchen levalbuterol (XOPENEX) 0.63 MG/3ML nebulizer solution every 6 (six) hours as needed.     Marland Kitchen LEVEMIR FLEXTOUCH 100 UNIT/ML Pen Use as directed sliding scale    . levothyroxine (SYNTHROID, LEVOTHROID) 175 MCG tablet Monday -Thursday 159mg. Friday-Sunday is 1560m.    . meclizine (ANTIVERT) 25 MG tablet Take 25 mg by mouth as needed.     . meloxicam (MOBIC) 15 MG tablet Take 15 mg by mouth daily.    . metFORMIN (GLUCOPHAGE-XR) 500 MG 24 hr tablet 500 mg 2 (two) times daily.     . metoprolol (LOPRESSOR) 100 MG tablet Take 1 tablet (100 mg total) by mouth 2 (two) times daily. 180 tablet 3  . mometasone (ELOCON) 0.1 % cream     . montelukast (SINGULAIR) 10 MG tablet Take 10 mg by mouth at bedtime.    . mycophenolate (CELLCEPT) 500 MG tablet Take 500 mg by mouth See admin instructions. Patient states she takes 3 tabs in the AM and 2 tab in the PM    . NON FORMULARY IVIG treatments every 12 weeks at [facility]  . olmesartan (BENICAR) 20 MG tablet Take 20 mg by mouth daily.    . ONE TOUCH ULTRA TEST test strip     . potassium chloride SA (K-DUR,KLOR-CON) 20 MEQ tablet Take 20 mEq by mouth 2 (two) times daily.     . predniSONE (DELTASONE) 10 MG tablet Take 10 mg by mouth 2 (two) times daily.    . Marland KitchenROAIR HFA 108 (90 BASE) MCG/ACT inhaler Inhale 2 puffs into the lungs every 6 (six) hours as needed.     . traMADol (ULTRAM) 50 MG tablet Take 50 mg by mouth as needed.     . Vitamin D, Ergocalciferol, (DRISDOL)  50000 UNITS CAPS capsule Take 50,000 Units by mouth 2 (two) times a week.     . zolpidem (AMBIEN) 5 MG tablet Take 1 tablet by mouth at bedtime as needed.     No current facility-administered medications for this visit.    Allergies as of 09/19/2014 - Review Complete 09/19/2014  Allergen Reaction Noted  . Contrast media [iodinated diagnostic agents] Anaphylaxis 03/13/2013  . Fluorescein Shortness Of Breath 03/19/2014  . Iodine  Anaphylaxis 03/19/2014  . Codeine Hives and Nausea Only 02/27/2013    Vitals: BP 145/80 mmHg  Pulse 94  Resp 16  Ht _0  (1.626 m)  Wt 163 lb 8 oz (74.163 kg)  BMI 28.05 kg/m2 Last Weight:  Wt Readings from Last 1 Encounters:  09/19/14 163 lb 8 oz (74.163 kg)       Last Height:   Ht Readings from Last 1 Encounters:  09/19/14 _1  (1.626 m)    THIS PATIENT HAS AN INSULIN PUMP.  Caveat : MRI.    Physical exam:  General: The patient is awake, alert and appears not in acute distress. The patient is well groomed. Head: Normocephalic, atraumatic. Neck is supple. Mallampati 4, myasthenic delayed pharyngeal response, dysphagia.   neck circumference: 15.25  . Nasal airflow  Intact,   TMJ is described,  And found by claudication and click right more than left  . Retrognathia is seen.  Cardiovascular:  Regular rate and rhythm , without  murmurs or carotid bruit, and without distended neck veins. Respiratory: Lungs are  wheezing  Skin:  Without evidence of edema, or rash. Dry eyes, nose and mouth.  Trunk: BMI is  elevated and patient  has normal posture.  Neurologic exam : The patient is awake and alert, oriented to place and time.   Memory subjective described as intact.  There is a normal attention span & concentration ability.  Speech is fluent with dysphonia . Mood and affect are appropriate.  Cranial nerves: Pupils are equal and briskly reactive to light. Funduscopic exam with evidence of pallor, laser surgery, cotton wool.   Severe PTOSIS bilaterally.  Both eyelids cover the upper half of iris and Pupil.  Extraocular movements  in vertical and horizontal planes intact and without nystagmus. Visual fields by finger perimetry are intact. Hearing to finger rub intact.   Facial sensation intact to fine touch. Facial motor strength is symmetric and tongue and uvula move midline. Motor exam:  muscle bulk and symmetric strength in all extremities.  Sensory:  Meralgia paresthetica  left anterior thigh pain and dysesthesias.  Fine touch, pinprick and vibration were tested in all extremities. Loss of vibration in toes, felt some in ankles.  Proprioception is tested in the upper extremities only.  This was normal.  Coordination: Rapid alternating movements in the fingers/hands is normal.  Finger-to-nose maneuver normal without evidence of ataxia, dysmetria or tremor.  Gait and station:  Patient walks without assistive device and is able unassisted to climb up to the exam table. Strength within normal limits. Stance is wide based , stable.  Deep tendon reflexes: in the  upper and lower extremities are absent - no patella and no achilles tendon reflex.  Babinski maneuver response is downgoing.   Assessment:  After physical and neurologic examination, review of laboratory studies, imaging, neurophysiology testing and pre-existing records, assessment is :  This patient has a high risk of  hypoventilation and has only mild OSA from her neuromuscular condition. CPAP to be continued, very good compliance and response  in reduced fatigue.  She is diabetic and has pain and nocturia related sleep fragmentation on top.   Her insomnia was not longer responsive to Ambien at 20 mg at night, and she tried xanax 0.25 with good success, had success with the less risk prone BELSOMRA, too. .  RLS, painful sensory neuropathy and meralgia paresthetica.     The patient was advised of the nature of the diagnosed sleep disorder ( hypoxemia, hypoventilation without Co2 retention, CPAP compliance , leg dysesthesias ). 20 minute visit with more than 50% face to face discussion and education .  ,the treatment options and risks for general a health and wellness arising from not treating the condition. Visit duration was 30 minutes.   Plan:  Treatment plan and additional workup :  CPAP nose gel prescribed.  Return once a year for CPAP compliance.   Asencion Partridge Bruna Dills  MD  09/19/2014

## 2014-10-09 ENCOUNTER — Ambulatory Visit: Payer: 59 | Admitting: Cardiovascular Disease

## 2014-11-09 ENCOUNTER — Encounter: Payer: Self-pay | Admitting: Neurology

## 2014-12-14 ENCOUNTER — Ambulatory Visit (HOSPITAL_COMMUNITY)
Admission: RE | Admit: 2014-12-14 | Discharge: 2014-12-14 | Disposition: A | Discharge status: Home / Self Care | Payer: Self-pay | Source: Ambulatory Visit | Attending: Family | Admitting: Family

## 2014-12-14 MED ORDER — SODIUM CHLORIDE 0.9 % IV SOLN: INTRAVENOUS | Status: AC

## 2014-12-14 NOTE — Progress Notes (Signed)
Joelene Millin RN and Priscille Loveless NP were contacted to clarify order for IV infusion. Requested to put verbal order in for 1.5L of normal saline. The order is on the chart. Joelene Millin RN states that she will notify the patient of appointment place and time. Patient is scheduled to come in at 130pm.

## 2014-12-21 ENCOUNTER — Ambulatory Visit
Admission: RE | Admit: 2014-12-21 | Discharge: 2014-12-21 | Disposition: A | Discharge status: Home / Self Care | Payer: 59 | Source: Ambulatory Visit | Attending: Family | Admitting: Family

## 2014-12-21 ENCOUNTER — Other Ambulatory Visit: Payer: Self-pay | Admitting: Family

## 2014-12-21 DIAGNOSIS — R197 Diarrhea, unspecified: Secondary | ICD-10-CM

## 2015-01-01 ENCOUNTER — Other Ambulatory Visit: Payer: Self-pay | Admitting: Gastroenterology

## 2015-01-01 DIAGNOSIS — R1013 Epigastric pain: Secondary | ICD-10-CM

## 2015-01-02 ENCOUNTER — Other Ambulatory Visit: Payer: Self-pay | Admitting: Gastroenterology

## 2015-01-07 ENCOUNTER — Ambulatory Visit (HOSPITAL_COMMUNITY): Payer: 59

## 2015-01-08 ENCOUNTER — Ambulatory Visit (HOSPITAL_COMMUNITY): Payer: 59

## 2015-01-10 ENCOUNTER — Ambulatory Visit (HOSPITAL_COMMUNITY)
Admission: RE | Admit: 2015-01-10 | Discharge: 2015-01-10 | Disposition: A | Discharge status: Home / Self Care | Payer: 59 | Source: Ambulatory Visit | Attending: Gastroenterology | Admitting: Gastroenterology

## 2015-01-10 ENCOUNTER — Encounter (HOSPITAL_COMMUNITY): Payer: Self-pay

## 2015-01-10 DIAGNOSIS — R1013 Epigastric pain: Secondary | ICD-10-CM | POA: Insufficient documentation

## 2015-01-10 DIAGNOSIS — Z85850 Personal history of malignant neoplasm of thyroid: Secondary | ICD-10-CM | POA: Diagnosis not present

## 2015-01-10 DIAGNOSIS — R197 Diarrhea, unspecified: Secondary | ICD-10-CM | POA: Insufficient documentation

## 2015-01-10 DIAGNOSIS — R112 Nausea with vomiting, unspecified: Secondary | ICD-10-CM | POA: Diagnosis not present

## 2015-01-10 LAB — POCT I-STAT CREATININE: CREATININE: 0.6 mg/dL (ref 0.44–1.00)

## 2015-01-10 MED ORDER — IOHEXOL 300 MG/ML  SOLN
100.0000 mL | Freq: Once | INTRAMUSCULAR | Status: AC | PRN
  Administered 2015-01-10: 100 mL via INTRAVENOUS

## 2015-01-11 ENCOUNTER — Encounter: Payer: Self-pay | Admitting: Cardiovascular Disease

## 2015-02-18 ENCOUNTER — Other Ambulatory Visit: Payer: Self-pay | Admitting: Gastroenterology

## 2015-02-18 DIAGNOSIS — R1033 Periumbilical pain: Secondary | ICD-10-CM

## 2015-02-18 DIAGNOSIS — R110 Nausea: Secondary | ICD-10-CM

## 2015-02-27 ENCOUNTER — Ambulatory Visit (HOSPITAL_COMMUNITY)
Admission: RE | Admit: 2015-02-27 | Discharge: 2015-02-27 | Disposition: A | Discharge status: Home / Self Care | Payer: 59 | Source: Ambulatory Visit | Attending: Gastroenterology | Admitting: Gastroenterology

## 2015-02-27 DIAGNOSIS — K760 Fatty (change of) liver, not elsewhere classified: Secondary | ICD-10-CM | POA: Insufficient documentation

## 2015-02-27 DIAGNOSIS — K219 Gastro-esophageal reflux disease without esophagitis: Secondary | ICD-10-CM | POA: Insufficient documentation

## 2015-02-27 DIAGNOSIS — D3501 Benign neoplasm of right adrenal gland: Secondary | ICD-10-CM | POA: Insufficient documentation

## 2015-02-27 DIAGNOSIS — N281 Cyst of kidney, acquired: Secondary | ICD-10-CM | POA: Insufficient documentation

## 2015-02-27 DIAGNOSIS — R6881 Early satiety: Secondary | ICD-10-CM | POA: Diagnosis not present

## 2015-02-27 DIAGNOSIS — R112 Nausea with vomiting, unspecified: Secondary | ICD-10-CM | POA: Diagnosis not present

## 2015-02-27 DIAGNOSIS — R110 Nausea: Secondary | ICD-10-CM

## 2015-02-27 DIAGNOSIS — R1033 Periumbilical pain: Secondary | ICD-10-CM | POA: Insufficient documentation

## 2015-02-27 DIAGNOSIS — E119 Type 2 diabetes mellitus without complications: Secondary | ICD-10-CM | POA: Insufficient documentation

## 2015-02-27 MED ORDER — TECHNETIUM TC 99M SULFUR COLLOID
2.0000 | Freq: Once | INTRAVENOUS | Status: AC | PRN
  Administered 2015-02-27: 2 via ORAL

## 2015-02-28 ENCOUNTER — Ambulatory Visit (HOSPITAL_COMMUNITY)
Admission: RE | Admit: 2015-02-28 | Discharge: 2015-02-28 | Disposition: A | Discharge status: Home / Self Care | Payer: 59 | Source: Ambulatory Visit | Attending: Gastroenterology | Admitting: Gastroenterology

## 2015-02-28 DIAGNOSIS — R1033 Periumbilical pain: Secondary | ICD-10-CM

## 2015-02-28 DIAGNOSIS — R110 Nausea: Secondary | ICD-10-CM

## 2015-02-28 LAB — CREATININE, SERUM
Creatinine, Ser: 0.62 mg/dL (ref 0.44–1.00)
GFR calc Af Amer: >60 mL/min (ref >60)
GFR calc non Af Amer: >60 mL/min (ref >60)

## 2015-02-28 MED ORDER — GADOBENATE DIMEGLUMINE 529 MG/ML IV SOLN
15.0000 mL | Freq: Once | INTRAVENOUS | Status: AC | PRN
  Administered 2015-02-28: 15 mL via INTRAVENOUS

## 2015-05-16 ENCOUNTER — Encounter: Payer: Self-pay | Admitting: Cardiology

## 2015-05-16 ENCOUNTER — Ambulatory Visit (INDEPENDENT_AMBULATORY_CARE_PROVIDER_SITE_OTHER): Payer: 59 | Admitting: Cardiology

## 2015-05-16 ENCOUNTER — Encounter: Payer: Self-pay | Admitting: *Deleted

## 2015-05-16 VITALS — BP 134/60 | HR 104 | Ht 63.0 in | Wt 175.5 lb

## 2015-05-16 DIAGNOSIS — R079 Chest pain, unspecified: Secondary | ICD-10-CM | POA: Diagnosis not present

## 2015-05-16 DIAGNOSIS — G4733 Obstructive sleep apnea (adult) (pediatric): Secondary | ICD-10-CM

## 2015-05-16 DIAGNOSIS — G7000 Myasthenia gravis without (acute) exacerbation: Secondary | ICD-10-CM

## 2015-05-16 DIAGNOSIS — I471 Supraventricular tachycardia, unspecified: Secondary | ICD-10-CM

## 2015-05-16 DIAGNOSIS — R002 Palpitations: Secondary | ICD-10-CM

## 2015-05-16 DIAGNOSIS — R0600 Dyspnea, unspecified: Secondary | ICD-10-CM

## 2015-05-16 NOTE — Progress Notes (Signed)
Cardiology Office Note   Date:  05/16/2015   ID:  [name], DOB [birth year], MRN [id number]  PCP:  Pcp Not In System PA at [facility], [address], [place]  Cardiologist:  Dr. Adora Fridge     Chief Complaint  Patient presents with  . Palpitations    all the time/occ sob and pain with racing      History of Present Illness: [name] is a 64 y.o. female who presents for palpitations with ST at 104 on EKG.  She has a past medical history significant for DM, HTN, HLD, Myasthenia Gravis and thyroid cancer, s/p thyroidectomy. She also has a family history of heart disease. Her sister has CAD and underwent coronary stenting x 1. Both parents had CHF. The patient underwent a diagnostic LHC in 2012, in the setting of ongoing chest pain. The procedure was actually performed by Dr. Daneen Schick, of [facility], and demonstrated normal coronaries and normal LV function. Patient underwent LexiScan myoview stress test which was negative with episode of chest pain. She has a hx. of thyroid cancer. She is followed by Dr. Bubba Camp. Her last 2D echo was 02/2013 and was essentially normal except for grade one diastolic dysfunction.   She has episodes of tachycardia ST on event monitor up to rates of 130.  Review of those strips today with HR 153 ? Flutter but artifact on base line.  She was placed on Lopressor 12.5mg  BID we have been titrating the dose according to symptoms. This apparently had helped reduce the frequency.  She is now on 100 mg BID.    Today: she continues with constant mild tachycardia but at times her HR is > 130 and lasts all day. She just goes to bed.  She may have 2 episodes of week of the fast HR.  At times she develops Lt arm pain.  She does have dyspnea with and without the tachycardia.        She has been re-evaluated for sleep apnea and wears her cpap most of the time.   Echo: 2014 Study Conclusions - Left ventricle: The cavity size was normal.  Systolic function was normal. Wall motion was normal; there were no regional wall motion abnormalities. Doppler parameters are consistent with abnormal left ventricular relaxation (grade 1 diastolic dysfunction). - Mitral valve: Calcified annulus  Past Medical History  Diagnosis Date  . Corns and callosities   . Difficulty in walking(719.7)   . Hypertension   . Myasthenia gravis   . High cholesterol   . Heart murmur   . Chest pain at rest     "woke me up in my sleep" (03/13/2013)  . Asthma   . Shortness of breath     "can happen at anytime" (03/13/2013)  . Sleep apnea     "use to wear mask; cause of insurance purposes I'm not not" (03/13/2013)  . Hypothyroidism   . Type II diabetes mellitus   . H/O hiatal hernia   . GERD (gastroesophageal reflux disease)   . Arthritis     "all over my body" (03/13/2013)  . Thyroid carcinoma   . Myasthenia gravis     "in my eyes; diagnsosed > 7 yr ago" (03/13/2013)  . History of stress test 07/11/2010    compared to previous study there is no significant change, Normal Myocardial Perfusion study, this is a low risk scan  . Hx of echocardiogram 07/08/2010    EF> 55% normal 2D Echo  . Palpitations  Past Surgical History  Procedure Laterality Date  . Total thyroidectomy    . Tonsillectomy    . Abdominal hysterectomy    . Appendectomy    . Cholecystectomy    . Carpal tunnel release Right   . Knee arthroscopy Left   . Cataract extraction w/ intraocular lens  implant, bilateral Bilateral   . Cardiac catheterization      "several" (03/13/2013)  . Anterior cervical decomp/discectomy fusion      "I've had severa ORs; always went in from the front" (03/13/2013)  . Shoulder arthroscopy w/ rotator cuff repair Left      Current Outpatient Prescriptions  Medication Sig Dispense Refill  . Alcohol Swabs (B-D SINGLE USE SWABS REGULAR) PADS     . B-D ULTRAFINE III SHORT PEN 31G X 8 MM MISC     . BUTRANS 10 MCG/HR PTWK patch as needed.       Marland Kitchen BYDUREON 2 MG SUSR Inject 2 mg as directed once a week.     . cyclobenzaprine (FLEXERIL) 10 MG tablet 3 (three) times daily as needed.     . cycloSPORINE (RESTASIS) 0.05 % ophthalmic emulsion Place 1 drop into both eyes 2 (two) times daily.    . diclofenac sodium (VOLTAREN) 1 % GEL Apply 2 g topically 2 (two) times daily.    Marland Kitchen esomeprazole (NEXIUM) 40 MG capsule Take 40 mg by mouth 2 (two) times daily.    Marland Kitchen levalbuterol (XOPENEX) 0.63 MG/3ML nebulizer solution every 6 (six) hours as needed.     Marland Kitchen levothyroxine (SYNTHROID, LEVOTHROID) 175 MCG tablet Monday -Thursday 193mcg. Friday-Sunday is 150mcg.    . meclizine (ANTIVERT) 25 MG tablet Take 25 mg by mouth as needed.     . metFORMIN (GLUCOPHAGE-XR) 500 MG 24 hr tablet 500 mg 2 (two) times daily.     . metoprolol (LOPRESSOR) 100 MG tablet Take 1 tablet (100 mg total) by mouth 2 (two) times daily. (Patient taking differently: Take 75 mg by mouth 2 (two) times daily. ) 180 tablet 3  . mometasone (ELOCON) 0.1 % cream     . montelukast (SINGULAIR) 10 MG tablet Take 10 mg by mouth at bedtime.    . mycophenolate (CELLCEPT) 500 MG tablet Take 500 mg by mouth See admin instructions. Patient states she takes 3 tabs in the AM and 2 tab in the PM    . NON FORMULARY IVIG treatments every 12 weeks at [place]    . olmesartan (BENICAR) 20 MG tablet Take 20 mg by mouth daily.    . ONE TOUCH ULTRA TEST test strip     . potassium chloride SA (K-DUR,KLOR-CON) 20 MEQ tablet Take 20 mEq by mouth 2 (two) times daily.     . predniSONE (DELTASONE) 10 MG tablet Take 10 mg by mouth 2 (two) times daily.    Marland Kitchen PROAIR HFA 108 (90 BASE) MCG/ACT inhaler Inhale 2 puffs into the lungs every 6 (six) hours as needed.     . traMADol (ULTRAM) 50 MG tablet Take 50 mg by mouth as needed.     . Vitamin D, Ergocalciferol, (DRISDOL) 50000 UNITS CAPS capsule Take 50,000 Units by mouth 2 (two) times a week.     . zolpidem (AMBIEN) 5 MG tablet Take 1 tablet by mouth at bedtime as needed.      No current facility-administered medications for this visit.    Allergies:   Contrast media; Fluorescein; Iodine; Codeine; and Molds & smuts    Social History:  The patient  reports that she has never smoked. She has never used smokeless tobacco. She reports that she does not drink alcohol or use illicit drugs.   Family History:  The patient's family history includes Asthma in her father and mother; Congestive Heart Failure in her father and mother; Coronary artery disease in her sister; Lung cancer in her father.    ROS:  General:no colds or fevers, no weight changes Skin:no rashes or ulcers HEENT:no blurred vision, no congestion CV:see HPI PUL:see HPI GI:no diarrhea constipation or melena, no indigestion, + abd pain followed by Dr. Collene Mares with normal EGD and colonoscopy GU:no hematuria, no dysuria MS:no joint pain, no claudication Neuro:no syncope, no lightheadedness Endo:no diabetes, + thyroid cancer with thyroidectomy followed by Dr. Scarlette Shorts Readings from Last 3 Encounters:  05/16/15 175 lb 8 oz (79.606 kg)  09/19/14 163 lb 8 oz (74.163 kg)  08/13/14 168 lb 9.6 oz (76.476 kg)     PHYSICAL EXAM: VS:  BP 134/60 mmHg  Pulse 104  Ht 5\' 3"  (1.6 m)  Wt 175 lb 8 oz (79.606 kg)  BMI 31.10 kg/m2 , BMI Body mass index is 31.1 kg/(m^2). General:Pleasant affect, NAD Skin:Warm and dry, brisk capillary refill HEENT:normocephalic, sclera clear, mucus membranes moist Neck:supple, no JVD, no bruits  Heart:S1S2 RRR a little fast without murmur, gallup, rub or click Lungs:clear without rales, rhonchi, or wheezes TWS:FKCL, + tenderness, + BS, do not palpate liver spleen or masses Ext:no lower ext edema, 2+ pedal pulses, 2+ radial pulses Neuro:alert and oriented X 3, MAE, follows commands, + facial symmetry    EKG:  EKG is ordered today. The ekg ordered today demonstrates ST at 104 no other changes   Recent Labs: 02/28/2015: Creatinine, Ser 0.62    Lipid Panel No results  found for: CHOL, TRIG, HDL, CHOLHDL, VLDL, LDLCALC, LDLDIRECT     Other studies Reviewed: Additional studies/ records that were reviewed today include: see above, previous notes, echo event monitor..   ASSESSMENT AND PLAN:  1.  Tachycardia episodic twice a week, BB even at 100 BID has not helped much.  Occurs with lt arm pain at times. Last evaluated 2014- will do event monitor to see if better strips to confirm arrhthymia, and burden.  May need EP consult for control.  She will follow up with Dr. Adora Fridge    2. Dyspnea with tachycardia or alone. With FH of CHF and her MG will recheck her LV function especially with freq tachycardia.  3. OSA with cpap  4. DM2 followed by PCP  5. Myasthenia gravis in remission.      Current medicines are reviewed with the patient today.  The patient Has no concerns regarding medicines.  The following changes have been made:  See above Labs/ tests ordered today include:see above  Disposition:   FU:  see above  Signed, Isaiah Serge, NP  05/16/2015 4:16 PM    [facility] [place], [place], [place], [place] Phone: [phone]; Fax: [phone]

## 2015-05-16 NOTE — Patient Instructions (Signed)
Your physician recommends that you schedule a follow-up appointment in: [place] has requested that you have an echocardiogram. Echocardiography is a painless test that uses sound waves to create images of your heart. It provides your doctor with information about the size and shape of your heart and how well your heart's chambers and valves are working. This procedure takes approximately one hour. There are no restrictions for this procedure.   Your physician has recommended that you wear an event monitor. Event monitors are medical devices that record the heart's electrical activity. Doctors most often [place] these monitors to diagnose arrhythmias. Arrhythmias are problems with the speed or rhythm of the heartbeat. The monitor is a small, portable device. You can wear one while you do your normal daily activities. This is usually used to diagnose what is causing palpitations/syncope (passing out). SCHEDULE AT [facility]

## 2015-05-29 ENCOUNTER — Ambulatory Visit (HOSPITAL_COMMUNITY): Payer: 59 | Attending: Cardiology

## 2015-05-29 ENCOUNTER — Other Ambulatory Visit: Payer: Self-pay

## 2015-05-29 ENCOUNTER — Ambulatory Visit (INDEPENDENT_AMBULATORY_CARE_PROVIDER_SITE_OTHER): Payer: 59

## 2015-05-29 DIAGNOSIS — R002 Palpitations: Secondary | ICD-10-CM | POA: Diagnosis not present

## 2015-05-29 DIAGNOSIS — I10 Essential (primary) hypertension: Secondary | ICD-10-CM | POA: Insufficient documentation

## 2015-05-29 DIAGNOSIS — Z8249 Family history of ischemic heart disease and other diseases of the circulatory system: Secondary | ICD-10-CM | POA: Diagnosis not present

## 2015-05-29 DIAGNOSIS — I5189 Other ill-defined heart diseases: Secondary | ICD-10-CM | POA: Insufficient documentation

## 2015-05-29 DIAGNOSIS — I071 Rheumatic tricuspid insufficiency: Secondary | ICD-10-CM | POA: Insufficient documentation

## 2015-05-29 DIAGNOSIS — E119 Type 2 diabetes mellitus without complications: Secondary | ICD-10-CM | POA: Insufficient documentation

## 2015-05-29 DIAGNOSIS — E785 Hyperlipidemia, unspecified: Secondary | ICD-10-CM | POA: Diagnosis not present

## 2015-05-29 DIAGNOSIS — I350 Nonrheumatic aortic (valve) stenosis: Secondary | ICD-10-CM | POA: Diagnosis not present

## 2015-05-29 DIAGNOSIS — R0600 Dyspnea, unspecified: Secondary | ICD-10-CM | POA: Diagnosis not present

## 2015-06-04 ENCOUNTER — Telehealth: Payer: Self-pay | Admitting: Cardiovascular Disease

## 2015-06-04 NOTE — Telephone Encounter (Signed)
Returning a call about her results . Please call  ° °Thanks  °

## 2015-06-04 NOTE — Telephone Encounter (Signed)
Patient called and notified of echo results

## 2015-07-09 ENCOUNTER — Encounter: Payer: Self-pay | Admitting: Cardiovascular Disease

## 2015-07-09 ENCOUNTER — Ambulatory Visit (INDEPENDENT_AMBULATORY_CARE_PROVIDER_SITE_OTHER): Payer: 59 | Admitting: Cardiovascular Disease

## 2015-07-09 VITALS — BP 134/74 | HR 97 | Ht 63.0 in | Wt 176.0 lb

## 2015-07-09 DIAGNOSIS — R000 Tachycardia, unspecified: Secondary | ICD-10-CM | POA: Diagnosis not present

## 2015-07-09 DIAGNOSIS — I10 Essential (primary) hypertension: Secondary | ICD-10-CM

## 2015-07-09 NOTE — Progress Notes (Signed)
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 I last saw her 01/30/14. She has a past medical history significant for DM, HTN, HLD, Myasthenia Gravis and thyroid cancer, s/p thyroidectomy. She also has a family history of heart disease. Her sister has CAD and underwent coronary stenting x 1. Both parents had CHF. The patient underwent a diagnostic LHC in 2012, in the setting of ongoing chest pain. The procedure was actually performed by Dr. Daneen Schick, of [facility], and demonstrated normal coronaries and normal LV function. Since then, she states that she has had occasional chest discomfort, which she has attributed to indigestion, as it has typically been relieved with antacids. However, she presented to the [facility] ER on 03/13/13 with a complaint of a different type of chest pain that she has never experienced before. Patient underwent LexiScan myoview stress test which was negative. She had thyroid cancer and had her last round of radiation in September 2014. Her last 2D echo was 05/29/15 showed normal LV systolic function with grade 1 diastolic dysfunction..  I began her on Lopressor 12.5mg  BID and have been slowly titrating the dose up to 100 mg twice a day currently.. This apparently has helped reduce the frequency. Since I saw her back in December she is medically stable. She continued to get episodic/occasional chest pain and does complain of occasional tachycardia. She saw Cecilie Kicks in the office 05/14/15 because of "palpitations and racing heart. A 30 day event monitor simply showed sinus rhythm/sinus tachycardia.   Current Outpatient Prescriptions  Medication Sig Dispense Refill  . Alcohol Swabs (B-D SINGLE USE SWABS REGULAR) PADS     . B-D ULTRAFINE III SHORT PEN 31G X 8 MM MISC     .  budesonide-formoterol (SYMBICORT) 160-4.5 MCG/ACT inhaler Inhale 2 puffs into the lungs 2 (two) times daily.    Marland Kitchen BUTRANS 10 MCG/HR PTWK patch as needed.     Marland Kitchen BYDUREON 2 MG SUSR Inject 2 mg as directed once a week.     . cyclobenzaprine (FLEXERIL) 10 MG tablet 3 (three) times daily as needed.     . cycloSPORINE (RESTASIS) 0.05 % ophthalmic emulsion Place 1 drop into both eyes 2 (two) times daily.    . diclofenac sodium (VOLTAREN) 1 % GEL Apply 2 g topically 2 (two) times daily.    . diphenoxylate-atropine (LOMOTIL) 2.5-0.025 MG per tablet Take 2 tablets by mouth 4 (four) times daily as needed for diarrhea or loose stools.    Marland Kitchen esomeprazole (NEXIUM) 40 MG capsule Take 40 mg by mouth 2 (two) times daily.    . Insulin NPH Human, Isophane, (HUMULIN N KWIKPEN [place]) Inject into the skin. SLIDING SCALE    . l-methylfolate-B6-B12 (METANX) 3-35-2 MG TABS Take 1 tablet by mouth 3 (three) times daily.    Marland Kitchen levalbuterol (XOPENEX) 0.63 MG/3ML nebulizer solution every 6 (six) hours as needed.     Marland Kitchen levothyroxine (SYNTHROID, LEVOTHROID) 175 MCG tablet Take 175 mcg by mouth daily before breakfast.     . meclizine (ANTIVERT) 25 MG tablet Take 25 mg by mouth as needed.     . metFORMIN (GLUCOPHAGE-XR) 500 MG 24 hr tablet 500 mg 2 (two) times daily.     . metoprolol (LOPRESSOR) 100 MG tablet Take 1 tablet (100 mg total) by mouth 2 (two)  times daily. (Patient taking differently: Take 75 mg by mouth 2 (two) times daily. ) 180 tablet 3  . mometasone (ELOCON) 0.1 % cream     . mometasone (NASONEX) 50 MCG/ACT nasal spray Place 2 sprays into the nose daily.    . montelukast (SINGULAIR) 10 MG tablet Take 10 mg by mouth at bedtime.    . mycophenolate (CELLCEPT) 500 MG tablet Take 500 mg by mouth See admin instructions. Patient states she takes 3 tabs in the AM and 2 tab in the PM    . NON FORMULARY IVIG treatments every 12 weeks at [place]    . olmesartan (BENICAR) 20 MG tablet Take 20 mg by mouth daily.    . ONE TOUCH ULTRA  TEST test strip     . potassium chloride SA (K-DUR,KLOR-CON) 20 MEQ tablet Take 20 mEq by mouth 2 (two) times daily.     . predniSONE (DELTASONE) 10 MG tablet Take 10 mg by mouth 2 (two) times daily.    Marland Kitchen PROAIR HFA 108 (90 BASE) MCG/ACT inhaler Inhale 2 puffs into the lungs every 6 (six) hours as needed.     . promethazine (PHENERGAN) 25 MG tablet Take 25 mg by mouth every 6 (six) hours as needed for nausea or vomiting.    . traMADol (ULTRAM) 50 MG tablet Take 50 mg by mouth as needed.     . Vitamin D, Ergocalciferol, (DRISDOL) 50000 UNITS CAPS capsule Take 50,000 Units by mouth 2 (two) times a week.     . zolpidem (AMBIEN) 5 MG tablet Take 1 tablet by mouth at bedtime as needed.     No current facility-administered medications for this visit.    Allergies  Allergen Reactions  . Contrast Media [Iodinated Diagnostic Agents] Anaphylaxis    01-10-15---PT GIVEN 13 HR PRE MEDS FOR CT--PT TOLERATED IV CONTRAST W/O ANY REACTION------[name] RT-R CT  . Fluorescein Shortness Of Breath    dye  . Iodine Anaphylaxis    Contrast dye - iodine  . Codeine Hives and Nausea Only  . Molds & Smuts     Social History   Social History  . Marital Status: Single    Spouse Name: N/A  . Number of Children: 0  . Years of Education: college   Occupational History  . retired    Social History Main Topics  . Smoking status: Never Smoker   . Smokeless tobacco: Never Used  . Alcohol Use: No  . Drug Use: No  . Sexual Activity: No   Other Topics Concern  . Not on file   Social History Narrative   Caffeine none.     Review of Systems: General: negative for chills, fever, night sweats or weight changes.  Cardiovascular: negative for chest pain, dyspnea on exertion, edema, orthopnea, palpitations, paroxysmal nocturnal dyspnea or shortness of breath Dermatological: negative for rash Respiratory: negative for cough or wheezing Urologic: negative for hematuria Abdominal: negative for nausea,  vomiting, diarrhea, bright red blood per rectum, melena, or hematemesis Neurologic: negative for visual changes, syncope, or dizziness All other systems reviewed and are otherwise negative except as noted above.    Blood pressure 134/74, pulse 97, height 5\' 3"  (1.6 m), weight 176 lb (79.833 kg).  General appearance: alert and no distress Neck: no adenopathy, no carotid bruit, no JVD, supple, symmetrical, trachea midline and thyroid not enlarged, symmetric, no tenderness/mass/nodules Lungs: clear to auscultation bilaterally Heart: regular rate and rhythm, S1, S2 normal, no murmur, click, rub or gallop Extremities: extremities normal,  atraumatic, no cyanosis or edema  EKG not performed today  ASSESSMENT AND PLAN:   Sinus tachycardia History of inappropriate sinus tachycardia with recent normal 2-D echo on high-dose beta blocker with improvement in her heart rate response and symptoms.  Essential hypertension, benign History of hypertension blood pressure measurements at 134/74. She is on Barista. Continue current meds at current dosing      Lorretta Harp MD [facility], [facility] 07/09/2015 9:25 AM

## 2015-07-09 NOTE — Assessment & Plan Note (Signed)
History of inappropriate sinus tachycardia with recent normal 2-D echo on high-dose beta blocker with improvement in her heart rate response and symptoms.

## 2015-07-09 NOTE — Patient Instructions (Signed)
Medication Instructions:  Your physician recommends that you continue on your current medications as directed. Please refer to the Current Medication list given to you today.   Labwork: none  Testing/Procedures: none  Follow-Up: We request that you follow-up in: 6 months with an Cecilie Kicks and in 12 months with Dr Andria Rhein will receive a reminder letter in the mail two months in advance. If you don't receive a letter, please call our office to schedule the follow-up appointment.    Any Other Special Instructions Will Be Listed Below (If Applicable).     If you need a refill on your cardiac medications before your next appointment, please call your pharmacy.

## 2015-07-09 NOTE — Assessment & Plan Note (Signed)
History of hypertension blood pressure measurements at 134/74. She is on Barista. Continue current meds at current dosing

## 2015-07-12 ENCOUNTER — Other Ambulatory Visit: Payer: Self-pay | Admitting: Pediatrics

## 2015-07-15 ENCOUNTER — Telehealth: Payer: Self-pay | Admitting: Cardiovascular Disease

## 2015-07-15 DIAGNOSIS — R0789 Other chest pain: Secondary | ICD-10-CM

## 2015-07-15 NOTE — Telephone Encounter (Signed)
Pt called in wanting to speak with Maudie Mercury about her monitor results . Please f/u with her   Thanks

## 2015-07-15 NOTE — Telephone Encounter (Signed)
Reviewed pt monitor results.  Pt c/o of chest pain of 8 that at times radiates down her left arm, lasting 10-20 minutes.  Most times pain in the center of chest and sometime under her left breast.  Pt stated this happens often and she is worried as her mother, father, brother, and sister all have heart disease, with mother dying of heart failure.  Pt stated that most times after sitting for awhile pain will go away.  It does not happen at any specific time of day or after eating or exercise.  Told pt that I would forward to Dr. Gwenlyn Found for review and call her back if there are any suggestions.  Pt reminded to stick with current medication plan, pt verbalized understanding no additional questions at this time.

## 2015-07-16 NOTE — Telephone Encounter (Signed)
Order pharmacologic Myoview stress test and return office visit

## 2015-07-25 NOTE — Telephone Encounter (Signed)
Left message to call back Orders placed for pt to get Myoview and pt needs OV with Dr. Gwenlyn Found for after testing is complete.

## 2015-07-29 ENCOUNTER — Encounter: Payer: Self-pay | Admitting: Pediatrics

## 2015-07-29 ENCOUNTER — Ambulatory Visit (INDEPENDENT_AMBULATORY_CARE_PROVIDER_SITE_OTHER): Payer: 59 | Admitting: Pediatrics

## 2015-07-29 VITALS — BP 140/80 | HR 89 | Temp 97.7°F | Resp 16

## 2015-07-29 DIAGNOSIS — G7000 Myasthenia gravis without (acute) exacerbation: Secondary | ICD-10-CM | POA: Diagnosis not present

## 2015-07-29 DIAGNOSIS — J4540 Moderate persistent asthma, uncomplicated: Secondary | ICD-10-CM | POA: Insufficient documentation

## 2015-07-29 DIAGNOSIS — Z79899 Other long term (current) drug therapy: Secondary | ICD-10-CM | POA: Diagnosis not present

## 2015-07-29 DIAGNOSIS — K219 Gastro-esophageal reflux disease without esophagitis: Secondary | ICD-10-CM | POA: Diagnosis not present

## 2015-07-29 NOTE — Progress Notes (Signed)
  [address] [place] [address] Dept: [phone]  FOLLOW UP NOTE  Patient ID: [name], female    DOB: 1951/07/09  Age: 64 y.o. MRN: [id number] Date of Office Visit: 07/29/2015  Assessment Chief Complaint: Cough; Headache; and Wheezing  HPI [name] presents for follow-up of her asthma. She has been coughing for about 2 weeks with initially a discolored mucus. She was given Levaquin 500 mg daily for 7 days by her family doctor. She is better but she is still coughing Her diabetes is well controlled  Current medications are few Qnasl 80-1 spray per nostril once a day, Nexium 40 mg daily, Xopenex 0.63 MG per 3 ML to use every 6 hours if needed, prednisone 10 mg once a day for myasthenia gravis, Pro-air 2 puffs every 4 hours if needed, Symbicort 160-2 puffs twice a day. Her other medications are outlined in the chart   Drug Allergies:  Allergies  Allergen Reactions  . Contrast Media [Iodinated Diagnostic Agents] Anaphylaxis    01-10-15---PT GIVEN 13 HR PRE MEDS FOR CT--PT TOLERATED IV CONTRAST W/O ANY REACTION------[name] RT-R CT  . Fluorescein Shortness Of Breath    dye  . Iodine Anaphylaxis    Contrast dye - iodine  . Codeine Hives and Nausea Only  . Molds & Smuts     Physical Exam: BP 140/80 mmHg  Pulse 89  Temp(Src) 97.7 F (36.5 C) (Oral)  Resp 16   Physical Exam  Constitutional: She is oriented to person, place, and time. She appears well-developed and well-nourished.  HENT:  Eyes normal. Ears normal. Nose mild swelling of nasal turbinates. Pharynx normal.  Neck: Neck supple.  Cardiovascular:  S1 and S2 normal no murmurs  Pulmonary/Chest:  Clear to percussion and auscultation  Lymphadenopathy:    She has no cervical adenopathy.  Neurological: She is alert and oriented to person, place, and time.  Psychiatric: She has a normal mood and affect. Her behavior is normal. Judgment and thought content normal.  Vitals  reviewed.   Diagnostics:  FVC 2.01 L FEV1 1.70 L. Predicted FVC 2.49 L predicted FEV1 1.95 L-spirometry in the normal range  Assessment and Plan: 1. Moderate persistent asthma, uncomplicated   2. Current use of beta blocker   3. Gastroesophageal reflux disease without esophagitis   4. Myasthenia gravis ([place])   5.     Insulin-dependent diabetes .    Patient Instructions  Continue on your current medications Increase prednisone to 10 mg twice a day for 4 days then back to 10 mg once a day for her myasthenia gravis She will call me if she is not doing well on this treatment plan    Return in about 6 months (around 01/27/2016).    Thank you for the opportunity to care for this patient.  Please do not hesitate to contact me with questions.  Penne Lash, M.D.  [facility] [address] [place], [place] [address] [phone]

## 2015-07-29 NOTE — Patient Instructions (Signed)
Continue on your current medications Increase prednisone to 10 mg twice a day for 4 days then back to 10 mg once a day for her myasthenia gravis She will call me if she is not doing well on this treatment plan

## 2015-07-30 ENCOUNTER — Ambulatory Visit (INDEPENDENT_AMBULATORY_CARE_PROVIDER_SITE_OTHER): Payer: 59 | Admitting: Adult Health

## 2015-07-30 ENCOUNTER — Other Ambulatory Visit: Payer: Self-pay | Admitting: Endocrinology

## 2015-07-30 ENCOUNTER — Encounter: Payer: Self-pay | Admitting: Adult Health

## 2015-07-30 VITALS — BP 131/73 | HR 96 | Ht 63.0 in | Wt 175.0 lb

## 2015-07-30 DIAGNOSIS — C73 Malignant neoplasm of thyroid gland: Secondary | ICD-10-CM

## 2015-07-30 DIAGNOSIS — G4733 Obstructive sleep apnea (adult) (pediatric): Secondary | ICD-10-CM

## 2015-07-30 DIAGNOSIS — Z9989 Dependence on other enabling machines and devices: Principal | ICD-10-CM

## 2015-07-30 NOTE — Progress Notes (Signed)
I agree with the assessment and plan as directed by NP .The patient is known to me .   Kojo Liby, MD  

## 2015-07-30 NOTE — Progress Notes (Signed)
PATIENT: [name] DOB: [birth year]  REASON FOR VISIT: follow up- OSA HISTORY FROM: patient  HISTORY OF PRESENT ILLNESS: [name]  Is a 64 year old female with a history of obstructive sleep apnea on CPAP. She returns today for a compliance download. The patient indicates that she is not been using her machine in the last month maybe longer due to her DME company not giving her new supplies. She states that she's been requesting new supplies from her DME company for quite some time. She states that she was told that her insurance would not cover it however when she called her insurance she states they have not gottenany request from her DME company. Nevertheless the patient has not been using her machine. She states that she now has this all worked out and she received her supplies. She plans to begin using her CPAP machine again. She states that she does feel better when she does use it. In the past she has been very compliant with the CPAP. The patient does have myasthenia gravis for which she follows up at [facility]. She denies any new neurological symptoms. She returns today for an evaluation.  HISTORY 09/19/14 (DOHMEIER): [name] is a 64 y.o.retired, afro-american, right handed female , who is seen here today in a revisit;  She is a former patient of Dr Karlton Lemon., and her PA had referred her for a sleep evaluation ( [facility]).   The patient describes that she has insomnia problems she falls asleep but she cannot stay asleep. She will sleep for 2-3 hours and then will wake up. She says that she has great trouble to reinitiate sleep and often these 3 hours at night may be all she gets. Sometimes she wakes from pain related to her diabetic peripheral neuropathy it seems to be mostly in her left foot and left anterior thigh. She states that her insomnia seems to have going on for several months now, likely about a year. The patient had an inconclusive sleep  study a year ago at the sleep Center at [place] had no advanced knowlegde nor record of this. She did not sleep in the lab, she reports . She was told the study will need to be repeated. Sleep habits. 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  The patient usually goes to bed around 11:00 but she reports that it will take her up to 2 hours to fall asleep before she then sleeps for only 3 hours or so. She has nocturia related to diabetes which has further fragmented sleep. She sleeps alone but family and get stronger holes have reported that she snores loudly. She sleeps in a cool, prior to and dark bedroom but she can't is comfortable. She does not watch TV in bed. She spontaneously wakes up at 7 in the morning traditionally for many years and she rises at 7:30. She does not drink coffee or caffeinated beverages. She regularly eats breakfast. Besides the diabetic neuropathy she also has eye disease and ptosis, diplopia related to myasthenia gravis. She no longer drives at night time because of her vision impairment.   The patient has thyroid cancer, arthritis, asthma, corneal abrasion, GERD, diabetes, high cholesterol and a history of thyroid cancer. She is followed for Myasthenia gravis by Dr. Nanine Means at [facility].   Interval history; 07-27-14. [name] is seen here today to discuss the results of her sleep  study. We found very mild sleep apnea mainly consistent of hypoxemia, or shallow breathing spells. There was a clear accentuation in REM sleep which further indicates that and lower muscle tone is responsible for the apnea. Oxygen desaturation at nadir was 75% but the total time of desaturation was even more concerning 135.4 minutes in toto of the sleep time recorded. She did not have CO2 retention her No graft measured 42.2 or during REM. There were no periodic limb movements. Her heart rate was regular in normal sinus rhythm.My  recommendations are based on the mild REM and positional dependent obstructive sleep apnea that CPAP only will address the hypoxemia if it is REM related. A dental device usually doesn't cover this but she will have some benefit by sleeping on the side and avoiding supine sleep position. Weight loss is also highly recommended. Given that the patient has a history of myasthenia gravis I am concerned that the prolonged hypoxemia at night is a affect of her neuromuscular weakness. The patient was clearly not in some neck she slept actually for the total recording time. I believe that her high degree of fatigue is related to hypoxemia rather than apnea. To prescribe oxygen supplementation I need to refer this patient for pulmonary function tests and pulse oximetry during activity. I cannot do this in my office.Ordered Belsomra as a non respiratory supressant sleep aid. Order xanax 0.5 mg prn anxiety. D/c ambien. Ordered auto titration CPAP , will need ONO on CPAP within the first 30 days. [facility]. Referral to pulmonology,  cc to dr Nanine Means at [facility].  Attach sleep study to both offices, [facility] and pulmonology.   09-19-14 Interval history the patient has done excellent with her CPAP compliance she has a compliance 400% of the last 30 days 25 days over 4 hours of nightly use equaling 83% compliance. Her average daily use is 5 hours and 21 minutes. The machine is set between 5 and 10 cm as an out told that with an EPR of 3 cm water residual AHI is 0.4 air leaks are small 91st percentile pressure is 8.6 cm water no evidence of central apnea emerging. Fatigue score is much reduced to 29 points Epworth sleepiness score to one point. She reports that her nose hurts from the CPAP use and that she has a very dry nostril. I will give her today a brochure about a CPAP gel that keeps and nostrils moist. I also will be happy to refill Belsomra as a sleep aid to be taken as needed. She continues to follow for myasthenia gravis with Dr.  Nanine Means  REVIEW OF SYSTEMS: Out of a complete 14 system review of symptoms, the patient complains only of the following symptoms, and all other reviewed systems are negative.   eye itching, light sensitivity, double vision, loss of vision, eye pain, blurred vision, cough, wheezing, shortness of breath, restless leg, apnea, daytime sleepiness, aching muscles, muscle cramps, walking difficulty, neck pain, frequent infections, bruise easily, headache  ALLERGIES: Allergies  Allergen Reactions  . Contrast Media [Iodinated Diagnostic Agents] Anaphylaxis    01-10-15---PT GIVEN 13 HR PRE MEDS FOR CT--PT TOLERATED IV CONTRAST W/O ANY REACTION------[name] RT-R CT  . Fluorescein Shortness Of Breath    dye  . Iodine Anaphylaxis    Contrast dye - iodine  . Codeine Hives and Nausea Only  . Molds & Smuts     HOME MEDICATIONS: Outpatient Prescriptions Prior to Visit  Medication Sig Dispense Refill  . Alcohol Swabs (B-D SINGLE USE SWABS  REGULAR) PADS     . B-D ULTRAFINE III SHORT PEN 31G X 8 MM MISC     . Beclomethasone Dipropionate (QNASL) 80 MCG/ACT AERS Place 1 spray into the nose daily.    Marland Kitchen BUTRANS 10 MCG/HR PTWK patch as needed.     Marland Kitchen BYDUREON 2 MG SUSR Inject 2 mg as directed once a week.     . cyclobenzaprine (FLEXERIL) 10 MG tablet 3 (three) times daily as needed.     . cycloSPORINE (RESTASIS) 0.05 % ophthalmic emulsion Place 1 drop into both eyes 2 (two) times daily.    . diclofenac sodium (VOLTAREN) 1 % GEL Apply 2 g topically 2 (two) times daily.    . diphenoxylate-atropine (LOMOTIL) 2.5-0.025 MG per tablet Take 2 tablets by mouth 4 (four) times daily as needed for diarrhea or loose stools.    Marland Kitchen esomeprazole (NEXIUM) 40 MG capsule Take 40 mg by mouth 2 (two) times daily.    . Insulin NPH Human, Isophane, (HUMULIN N KWIKPEN [place]) Inject into the skin. SLIDING SCALE    . l-methylfolate-B6-B12 (METANX) 3-35-2 MG TABS Take 1 tablet by mouth 3 (three) times daily.    Marland Kitchen levalbuterol (XOPENEX)  0.63 MG/3ML nebulizer solution every 6 (six) hours as needed.     Marland Kitchen levothyroxine (SYNTHROID, LEVOTHROID) 175 MCG tablet Take 175 mcg by mouth daily before breakfast.     . meclizine (ANTIVERT) 25 MG tablet Take 25 mg by mouth as needed.     . metFORMIN (GLUCOPHAGE-XR) 500 MG 24 hr tablet 500 mg 2 (two) times daily.     . metoprolol (LOPRESSOR) 100 MG tablet Take 1 tablet (100 mg total) by mouth 2 (two) times daily. (Patient taking differently: Take 75 mg by mouth 2 (two) times daily. ) 180 tablet 3  . mometasone (ELOCON) 0.1 % cream     . mometasone (NASONEX) 50 MCG/ACT nasal spray Place 2 sprays into the nose daily.    . montelukast (SINGULAIR) 10 MG tablet Take 10 mg by mouth at bedtime.    . mycophenolate (CELLCEPT) 500 MG tablet Take 500 mg by mouth See admin instructions. Patient states she takes 3 tabs in the AM and 2 tab in the PM    . NON FORMULARY IVIG treatments every 12 weeks at [place]    . olmesartan (BENICAR) 20 MG tablet Take 20 mg by mouth daily.    . ONE TOUCH ULTRA TEST test strip     . ONETOUCH DELICA LANCETS 99991111 MISC     . potassium chloride SA (K-DUR,KLOR-CON) 20 MEQ tablet Take 20 mEq by mouth 2 (two) times daily.     . predniSONE (DELTASONE) 10 MG tablet Take 10 mg by mouth 2 (two) times daily.    Marland Kitchen PROAIR HFA 108 (90 BASE) MCG/ACT inhaler Inhale 2 puffs into the lungs every 6 (six) hours as needed.     . promethazine (PHENERGAN) 25 MG tablet Take 25 mg by mouth every 6 (six) hours as needed for nausea or vomiting.    . SYMBICORT 160-4.5 MCG/ACT inhaler USE 2 INHALATIONS TWICE A DAY TO PREVENT COUGH OR WHEEZE. RINSE, GARGLE AND SPIT AFTER USE 30.6 g 1  . traMADol (ULTRAM) 50 MG tablet Take 50 mg by mouth as needed.     . TRANSDERM-SCOP, 1.5 MG, 1 MG/3DAYS     . Vitamin D, Ergocalciferol, (DRISDOL) 50000 UNITS CAPS capsule Take 50,000 Units by mouth 2 (two) times a week.     . zolpidem (AMBIEN) 5 MG  tablet Take 1 tablet by mouth at bedtime as needed.     No  facility-administered medications prior to visit.    PAST MEDICAL HISTORY: Past Medical History  Diagnosis Date  . Corns and callosities   . Difficulty in walking(719.7)   . Hypertension   . Myasthenia gravis ([place])   . High cholesterol   . Heart murmur   . Chest pain at rest     "woke me up in my sleep" (03/13/2013)  . Asthma   . Shortness of breath     "can happen at anytime" (03/13/2013)  . Sleep apnea     "use to wear mask; cause of insurance purposes I'm not not" (03/13/2013)  . Hypothyroidism   . Type II diabetes mellitus ([place])   . H/O hiatal hernia   . GERD (gastroesophageal reflux disease)   . Arthritis     "all over my body" (03/13/2013)  . Thyroid carcinoma ([place])   . Myasthenia gravis ([place])     "in my eyes; diagnsosed > 7 yr ago" (03/13/2013)  . History of stress test 07/11/2010    compared to previous study there is no significant change, Normal Myocardial Perfusion study, this is a low risk scan  . Hx of echocardiogram 07/08/2010    EF> 55% normal 2D Echo  . Palpitations     PAST SURGICAL HISTORY: Past Surgical History  Procedure Laterality Date  . Total thyroidectomy    . Tonsillectomy    . Abdominal hysterectomy    . Appendectomy    . Cholecystectomy    . Carpal tunnel release Right   . Knee arthroscopy Left   . Cataract extraction w/ intraocular lens  implant, bilateral Bilateral   . Cardiac catheterization      "several" (03/13/2013)  . Anterior cervical decomp/discectomy fusion      "I've had severa ORs; always went in from the front" (03/13/2013)  . Shoulder arthroscopy w/ rotator cuff repair Left     FAMILY HISTORY: Family History  Problem Relation Age of Onset  . Coronary artery disease Sister     s/p coronary stenting  . Congestive Heart Failure Mother   . Congestive Heart Failure Father   . Lung cancer Father   . Asthma Mother   . Asthma Father     SOCIAL HISTORY: Social History   Social History  . Marital Status: Single    Spouse  Name: N/A  . Number of Children: 0  . Years of Education: college   Occupational History  . retired    Social History Main Topics  . Smoking status: Never Smoker   . Smokeless tobacco: Never Used  . Alcohol Use: No  . Drug Use: No  . Sexual Activity: No   Other Topics Concern  . Not on file   Social History Narrative   Caffeine none.      PHYSICAL EXAM  Filed Vitals:   07/30/15 1003  BP: 131/73  Pulse: 96  Height: 5\' 3"  (1.6 m)  Weight: 175 lb (79.379 kg)   Body mass index is 31.01 kg/(m^2).  Generalized: Well developed, in no acute distress   neck:  Circumference 15 inches, Mallampati 3+  Neurological examination  Mentation: Alert oriented to time, place, history taking. Follows all commands speech and language fluent Cranial nerve II-XII: Pupils were equal round reactive to light. Extraocular movements were full, visual field were full on confrontational test. The ptosis  Bilaterally left greater than right. Facial sensation and strength were normal. Uvula  tongue midline. Head turning and shoulder shrug  were normal and symmetric. Motor: The motor testing reveals  Proximal muscle weakness in the upper extremities. The strength in the lower extremity.Kermit Balo symmetric motor tone is noted throughout.  Sensory: Sensory testing is intact to soft touch on all 4 extremities. No evidence of extinction is noted.  Coordination: Cerebellar testing reveals good finger-nose-finger and heel-to-shin bilaterally.  Gait and station:  Gait is slightly unsteady.  Reflexes: Deep tendon reflexes are symmetric and normal bilaterally.   DIAGNOSTIC DATA (LABS, IMAGING, TESTING) - I reviewed patient records, labs, notes, testing and imaging myself where available.   ASSESSMENT AND PLAN 64 y.o. year old female  has a past medical history of Corns and callosities; Difficulty in walking(719.7); Hypertension; Myasthenia gravis ([place]); High cholesterol; Heart murmur; Chest pain at rest; Asthma;  Shortness of breath; Sleep apnea; Hypothyroidism; Type II diabetes mellitus ([place]); H/O hiatal hernia; GERD (gastroesophageal reflux disease); Arthritis; Thyroid carcinoma ([place]); Myasthenia gravis ([place]); History of stress test (07/11/2010); echocardiogram (07/08/2010); and Palpitations. here with:   1. Obstructive sleep apnea on CPAP   Overall the patient is doing well. I have instructed her to begin using her CPAP now thather supplies replaced have been replaced. She will follow-up in 3 months for a compliance download. Patient instructed that if she has any change in symptoms she should let us know. She will return in 3 months or sooner if needed.  Ward Givens, MSN, NP-C 07/30/2015, 10:28 AM [facility] [address] [place], [place] [address] [phone]

## 2015-07-30 NOTE — Patient Instructions (Signed)
Begin using the cpap nightly If your symptoms worsen or you develop new symptoms please let us know.

## 2015-08-07 ENCOUNTER — Telehealth (HOSPITAL_COMMUNITY): Payer: Self-pay

## 2015-08-07 NOTE — Telephone Encounter (Signed)
Encounter complete. 

## 2015-08-09 ENCOUNTER — Ambulatory Visit (HOSPITAL_COMMUNITY)
Admission: RE | Admit: 2015-08-09 | Discharge: 2015-08-09 | Disposition: A | Discharge status: Home / Self Care | Payer: 59 | Source: Ambulatory Visit | Attending: Cardiology | Admitting: Cardiology

## 2015-08-09 ENCOUNTER — Ambulatory Visit
Admission: RE | Admit: 2015-08-09 | Discharge: 2015-08-09 | Disposition: A | Discharge status: Home / Self Care | Payer: 59 | Source: Ambulatory Visit | Attending: Endocrinology | Admitting: Endocrinology

## 2015-08-09 DIAGNOSIS — Z8249 Family history of ischemic heart disease and other diseases of the circulatory system: Secondary | ICD-10-CM | POA: Diagnosis not present

## 2015-08-09 DIAGNOSIS — R5383 Other fatigue: Secondary | ICD-10-CM | POA: Insufficient documentation

## 2015-08-09 DIAGNOSIS — I10 Essential (primary) hypertension: Secondary | ICD-10-CM | POA: Insufficient documentation

## 2015-08-09 DIAGNOSIS — R002 Palpitations: Secondary | ICD-10-CM | POA: Insufficient documentation

## 2015-08-09 DIAGNOSIS — R0789 Other chest pain: Secondary | ICD-10-CM

## 2015-08-09 DIAGNOSIS — G4733 Obstructive sleep apnea (adult) (pediatric): Secondary | ICD-10-CM | POA: Insufficient documentation

## 2015-08-09 DIAGNOSIS — R42 Dizziness and giddiness: Secondary | ICD-10-CM | POA: Insufficient documentation

## 2015-08-09 DIAGNOSIS — E119 Type 2 diabetes mellitus without complications: Secondary | ICD-10-CM | POA: Insufficient documentation

## 2015-08-09 DIAGNOSIS — C73 Malignant neoplasm of thyroid gland: Secondary | ICD-10-CM

## 2015-08-09 DIAGNOSIS — G5760 Lesion of plantar nerve, unspecified lower limb: Secondary | ICD-10-CM

## 2015-08-09 LAB — MYOCARDIAL PERFUSION IMAGING
CHL CUP RESTING HR STRESS: 99 {beats}/min
CSEPPHR: 116 {beats}/min
LV dias vol: 55 mL
LVSYSVOL: 16 mL
SDS: 0
SRS: 0
SSS: 0
TID: 1.24

## 2015-08-09 MED ORDER — TECHNETIUM TC 99M SESTAMIBI GENERIC - CARDIOLITE
10.5000 | Freq: Once | INTRAVENOUS | Status: AC | PRN
  Administered 2015-08-09: 10.5 via INTRAVENOUS

## 2015-08-09 MED ORDER — REGADENOSON 0.4 MG/5ML IV SOLN
0.4000 mg | Freq: Once | INTRAVENOUS | Status: AC
  Administered 2015-08-09: 0.4 mg via INTRAVENOUS

## 2015-08-09 MED ORDER — TECHNETIUM TC 99M SESTAMIBI GENERIC - CARDIOLITE
30.4000 | Freq: Once | INTRAVENOUS | Status: AC | PRN
  Administered 2015-08-09: 30.4 via INTRAVENOUS

## 2015-08-12 ENCOUNTER — Telehealth: Payer: Self-pay | Admitting: Cardiovascular Disease

## 2015-08-15 ENCOUNTER — Encounter: Payer: Self-pay | Admitting: Cardiovascular Disease

## 2015-08-15 ENCOUNTER — Ambulatory Visit (INDEPENDENT_AMBULATORY_CARE_PROVIDER_SITE_OTHER): Payer: 59 | Admitting: Cardiovascular Disease

## 2015-08-15 VITALS — BP 124/68 | HR 97 | Ht 63.0 in | Wt 172.0 lb

## 2015-08-15 DIAGNOSIS — R079 Chest pain, unspecified: Secondary | ICD-10-CM

## 2015-08-15 DIAGNOSIS — E785 Hyperlipidemia, unspecified: Secondary | ICD-10-CM

## 2015-08-15 DIAGNOSIS — I10 Essential (primary) hypertension: Secondary | ICD-10-CM | POA: Diagnosis not present

## 2015-08-15 DIAGNOSIS — R002 Palpitations: Secondary | ICD-10-CM | POA: Diagnosis not present

## 2015-08-15 LAB — HEPATIC FUNCTION PANEL
ALT: 17 U/L (ref 6–29)
AST: 19 U/L (ref 10–35)
Albumin: 3.6 g/dL (ref 3.6–5.1)
Alkaline Phosphatase: 109 U/L (ref 33–130)
BILIRUBIN DIRECT: 0.1 mg/dL (ref <=0.2)
BILIRUBIN INDIRECT: 0.2 mg/dL (ref 0.2–1.2)
BILIRUBIN TOTAL: 0.3 mg/dL (ref 0.2–1.2)
Total Protein: 8.2 g/dL — ABNORMAL HIGH (ref 6.1–8.1)

## 2015-08-15 LAB — LIPID PANEL
CHOL/HDL RATIO: 3.6 ratio (ref <=5.0)
CHOLESTEROL: 172 mg/dL (ref 125–200)
HDL: 48 mg/dL (ref >=46)
LDL CALC: 92 mg/dL (ref <130)
TRIGLYCERIDES: 159 mg/dL — AB (ref <150)
VLDL: 32 mg/dL — AB (ref <30)

## 2015-08-15 NOTE — Patient Instructions (Signed)
Medication Instructions:  Your physician recommends that you continue on your current medications as directed. Please refer to the Current Medication list given to you today.   Labwork: Your physician recommends that you return for lab work in: FASTING (lipid/liver) The lab can be found on the FIRST FLOOR of out building in [address]   Testing/Procedures: none  Follow-Up: We request that you follow-up in: 6 months with an extender and in 12 months with Dr Berry  You will receive a reminder letter in the mail two months in advance. If you don't receive a letter, please call our office to schedule the follow-up appointment.    Any Other Special Instructions Will Be Listed Below (If Applicable).     If you need a refill on your cardiac medications before your next appointment, please call your pharmacy.   

## 2015-08-15 NOTE — Progress Notes (Signed)
Miss [name] returns today for follow-up of her Myoview stress test performed 08/12/15 which was entirely normal. I believe that her chest pain is noncardiac and somewhat atypical. Blood pressure is under good control on her current medications as she does have a resting high sinus rhythm/sinus tachycardia palpitations controlled on high-dose beta blocker. I will have her see a mid-level provider back in 6 months to me back in one year.

## 2015-08-15 NOTE — Assessment & Plan Note (Signed)
History of hypertension blood pressure measured 124/68. She is on metoprolol and Benicar. Continue current meds. Doesn't

## 2015-08-15 NOTE — Assessment & Plan Note (Signed)
History of atypical chest pain with recent Myoview performed 08/12/15 which was completely normal.

## 2015-08-15 NOTE — Assessment & Plan Note (Signed)
History of heart palpitations with a three-day event monitor that showed sinus tachycardia with PACs on high-dose beta blocker. I believe that she is she just has a high sign sinus heart rate and have reassured her.

## 2015-08-21 ENCOUNTER — Ambulatory Visit: Payer: 59 | Admitting: Adult Health

## 2015-10-29 ENCOUNTER — Ambulatory Visit: Payer: 59 | Admitting: Adult Health

## 2016-01-27 ENCOUNTER — Ambulatory Visit: Payer: 59 | Admitting: Pediatrics

## 2016-01-27 ENCOUNTER — Ambulatory Visit: Payer: 59 | Admitting: Allergy and Immunology

## 2016-01-29 ENCOUNTER — Other Ambulatory Visit: Payer: Self-pay | Admitting: Endocrinology

## 2016-01-29 DIAGNOSIS — C73 Malignant neoplasm of thyroid gland: Secondary | ICD-10-CM

## 2016-02-03 ENCOUNTER — Ambulatory Visit
Admission: RE | Admit: 2016-02-03 | Discharge: 2016-02-03 | Disposition: A | Discharge status: Home / Self Care | Payer: 59 | Source: Ambulatory Visit | Attending: Endocrinology | Admitting: Endocrinology

## 2016-02-03 DIAGNOSIS — C73 Malignant neoplasm of thyroid gland: Secondary | ICD-10-CM

## 2016-05-29 DIAGNOSIS — E1165 Type 2 diabetes mellitus with hyperglycemia: Secondary | ICD-10-CM | POA: Diagnosis not present

## 2016-05-29 DIAGNOSIS — C73 Malignant neoplasm of thyroid gland: Secondary | ICD-10-CM | POA: Diagnosis not present

## 2016-05-29 DIAGNOSIS — E890 Postprocedural hypothyroidism: Secondary | ICD-10-CM | POA: Diagnosis not present

## 2016-06-03 DIAGNOSIS — H43811 Vitreous degeneration, right eye: Secondary | ICD-10-CM | POA: Diagnosis not present

## 2016-06-03 DIAGNOSIS — E113491 Type 2 diabetes mellitus with severe nonproliferative diabetic retinopathy without macular edema, right eye: Secondary | ICD-10-CM | POA: Diagnosis not present

## 2016-06-03 DIAGNOSIS — H5319 Other subjective visual disturbances: Secondary | ICD-10-CM | POA: Diagnosis not present

## 2016-06-11 DIAGNOSIS — G7000 Myasthenia gravis without (acute) exacerbation: Secondary | ICD-10-CM | POA: Diagnosis not present

## 2016-06-11 DIAGNOSIS — Z961 Presence of intraocular lens: Secondary | ICD-10-CM | POA: Diagnosis not present

## 2016-06-11 DIAGNOSIS — E113213 Type 2 diabetes mellitus with mild nonproliferative diabetic retinopathy with macular edema, bilateral: Secondary | ICD-10-CM | POA: Diagnosis not present

## 2016-06-12 DIAGNOSIS — M79652 Pain in left thigh: Secondary | ICD-10-CM | POA: Diagnosis not present

## 2016-06-12 DIAGNOSIS — Z79899 Other long term (current) drug therapy: Secondary | ICD-10-CM | POA: Diagnosis not present

## 2016-06-12 DIAGNOSIS — G7000 Myasthenia gravis without (acute) exacerbation: Secondary | ICD-10-CM | POA: Diagnosis not present

## 2016-06-22 DIAGNOSIS — J302 Other seasonal allergic rhinitis: Secondary | ICD-10-CM | POA: Diagnosis not present

## 2016-06-22 DIAGNOSIS — G629 Polyneuropathy, unspecified: Secondary | ICD-10-CM | POA: Diagnosis not present

## 2016-06-22 DIAGNOSIS — J0111 Acute recurrent frontal sinusitis: Secondary | ICD-10-CM | POA: Diagnosis not present

## 2016-06-22 DIAGNOSIS — Z79899 Other long term (current) drug therapy: Secondary | ICD-10-CM | POA: Diagnosis not present

## 2016-07-24 DIAGNOSIS — Z885 Allergy status to narcotic agent status: Secondary | ICD-10-CM | POA: Diagnosis not present

## 2016-07-24 DIAGNOSIS — Z9849 Cataract extraction status, unspecified eye: Secondary | ICD-10-CM | POA: Diagnosis not present

## 2016-07-24 DIAGNOSIS — E113213 Type 2 diabetes mellitus with mild nonproliferative diabetic retinopathy with macular edema, bilateral: Secondary | ICD-10-CM | POA: Diagnosis not present

## 2016-07-24 DIAGNOSIS — J45909 Unspecified asthma, uncomplicated: Secondary | ICD-10-CM | POA: Diagnosis not present

## 2016-07-24 DIAGNOSIS — I10 Essential (primary) hypertension: Secondary | ICD-10-CM | POA: Diagnosis not present

## 2016-07-24 DIAGNOSIS — Z794 Long term (current) use of insulin: Secondary | ICD-10-CM | POA: Diagnosis not present

## 2016-07-24 DIAGNOSIS — Z79899 Other long term (current) drug therapy: Secondary | ICD-10-CM | POA: Diagnosis not present

## 2016-07-24 DIAGNOSIS — Z7951 Long term (current) use of inhaled steroids: Secondary | ICD-10-CM | POA: Diagnosis not present

## 2016-07-24 DIAGNOSIS — Z961 Presence of intraocular lens: Secondary | ICD-10-CM | POA: Diagnosis not present

## 2016-07-24 DIAGNOSIS — Z880 Allergy status to penicillin: Secondary | ICD-10-CM | POA: Diagnosis not present

## 2016-09-22 DIAGNOSIS — E1165 Type 2 diabetes mellitus with hyperglycemia: Secondary | ICD-10-CM | POA: Diagnosis not present

## 2016-09-22 DIAGNOSIS — C73 Malignant neoplasm of thyroid gland: Secondary | ICD-10-CM | POA: Diagnosis not present

## 2016-09-22 DIAGNOSIS — E890 Postprocedural hypothyroidism: Secondary | ICD-10-CM | POA: Diagnosis not present

## 2016-09-30 DIAGNOSIS — E890 Postprocedural hypothyroidism: Secondary | ICD-10-CM | POA: Diagnosis not present

## 2016-09-30 DIAGNOSIS — E1165 Type 2 diabetes mellitus with hyperglycemia: Secondary | ICD-10-CM | POA: Diagnosis not present

## 2016-09-30 DIAGNOSIS — C73 Malignant neoplasm of thyroid gland: Secondary | ICD-10-CM | POA: Diagnosis not present

## 2016-10-21 DIAGNOSIS — S060X0A Concussion without loss of consciousness, initial encounter: Secondary | ICD-10-CM | POA: Diagnosis not present

## 2016-10-21 DIAGNOSIS — Z79899 Other long term (current) drug therapy: Secondary | ICD-10-CM | POA: Diagnosis not present

## 2016-10-21 DIAGNOSIS — G44319 Acute post-traumatic headache, not intractable: Secondary | ICD-10-CM | POA: Diagnosis not present

## 2016-10-26 DIAGNOSIS — R51 Headache: Secondary | ICD-10-CM | POA: Diagnosis not present

## 2016-11-09 ENCOUNTER — Telehealth: Payer: Self-pay | Admitting: Neurology

## 2016-11-09 NOTE — Telephone Encounter (Signed)
Noted, pt has an appt on 11/12/2016 with Dr. Brett Fairy,  I will be on the lookout for this paperwork.

## 2016-11-09 NOTE — Telephone Encounter (Signed)
Tammy(Medical Assistant)@ [facility] from [phone] xt [phone], she stated pt has been complaining about head aches, did a CT scan, Tammy will fax over all paper work on PT.  I advised her Pt needs to bring ID, insurance card list of meds and dosages

## 2016-11-12 ENCOUNTER — Ambulatory Visit (INDEPENDENT_AMBULATORY_CARE_PROVIDER_SITE_OTHER): Payer: Medicare Other | Admitting: Neurology

## 2016-11-12 ENCOUNTER — Encounter: Payer: Self-pay | Admitting: Neurology

## 2016-11-12 VITALS — BP 123/74 | HR 111 | Resp 20 | Ht 63.0 in | Wt 180.0 lb

## 2016-11-12 DIAGNOSIS — R519 Headache, unspecified: Secondary | ICD-10-CM | POA: Insufficient documentation

## 2016-11-12 DIAGNOSIS — M542 Cervicalgia: Secondary | ICD-10-CM | POA: Insufficient documentation

## 2016-11-12 DIAGNOSIS — G7000 Myasthenia gravis without (acute) exacerbation: Secondary | ICD-10-CM | POA: Diagnosis not present

## 2016-11-12 DIAGNOSIS — R51 Headache: Secondary | ICD-10-CM | POA: Diagnosis not present

## 2016-11-12 DIAGNOSIS — G4733 Obstructive sleep apnea (adult) (pediatric): Secondary | ICD-10-CM

## 2016-11-12 NOTE — Addendum Note (Signed)
Addended by: Larey Seat on: 11/12/2016 04:35 PM   Modules accepted: Orders, Level of Service

## 2016-11-12 NOTE — Progress Notes (Addendum)
PATIENT: [name] DOB: Feb 03, 1951  REASON FOR VISIT: follow up- OSA HISTORY FROM: patient  HISTORY OF PRESENT ILLNESS:  HISTORY 09/19/14 [facility]): [name] is a 66 y.o.retired, afro-american, right handed female , who is seen here today in a revisit;  She is a former patient of Dr Karlton Lemon., and her PA had referred her for a sleep evaluation ( [facility]).  The patient describes that she has insomnia problems she falls asleep but she cannot stay asleep. She will sleep for 2-3 hours and then will wake up. She says that she has great trouble to reinitiate sleep and often these 3 hours at night may be all she gets. Sometimes she wakes from pain related to her diabetic peripheral neuropathy it seems to be mostly in her left foot and left anterior thigh. She states that her insomnia seems to have going on for several months now, likely about a year. The patient had an inconclusive sleep study a year ago at the sleep Center at [place] had no advanced knowlegde nor record of this. She did not sleep in the lab, she reports . She was told the study will need to be repeated. Sleep habits. 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  The patient usually goes to bed around 11:00 but she reports that it will take her up to 2 hours to fall asleep before she then sleeps for only 3 hours or so. She has nocturia related to diabetes which has further fragmented sleep. She sleeps alone but family and get stronger holes have reported that she snores loudly. She sleeps in a cool, prior to and dark bedroom but she can't is comfortable. She does not watch TV in bed. She spontaneously wakes up at 7 in the morning traditionally for many years and she rises at 7:30. She does not drink coffee or caffeinated beverages. She regularly eats breakfast. Besides the diabetic neuropathy she also has eye disease and ptosis, diplopia  related to myasthenia gravis. She no longer drives at night time because of her vision impairment.   The patient has thyroid cancer, arthritis, asthma, corneal abrasion, GERD, diabetes, high cholesterol and a history of thyroid cancer. She is followed for Myasthenia gravis by Dr. Nanine Means at [facility].   Interval history; 07-27-14. [name] is seen here today to discuss the results of her sleep study. We found very mild sleep apnea mainly consistent of hypoxemia, or shallow breathing spells. There was a clear accentuation in REM sleep which further indicates that and lower muscle tone is responsible for the apnea. Oxygen desaturation at nadir was 75% but the total time of desaturation was even more concerning 135.4 minutes in toto of the sleep time recorded. She did not have CO2 retention her No graft measured 42.2 or during REM. There were no periodic limb movements. Her heart rate was regular in normal sinus rhythm.My recommendations are based on the mild REM and positional dependent obstructive sleep apnea that CPAP only will address the hypoxemia if it is REM related. A dental device usually doesn't cover this but she will have some benefit by sleeping on the side and avoiding supine sleep position. Weight loss is also highly recommended. Given that the patient has a history of myasthenia gravis I am concerned that the prolonged hypoxemia at night is a affect of her neuromuscular weakness. The patient was clearly not in some neck she  slept actually for the total recording time. I believe that her high degree of fatigue is related to hypoxemia rather than apnea. To prescribe oxygen supplementation I need to refer this patient for pulmonary function tests and pulse oximetry during activity. I cannot do this in my office.Ordered Belsomra as a non respiratory supressant sleep aid. Order xanax 0.5 mg prn anxiety. D/c ambien. Ordered auto titration CPAP , will need ONO on CPAP within the first 30 days. [facility].  Referral to pulmonology,  cc to dr Nanine [facility] at [facility].  Attach sleep study to both offices, [facility] and pulmonology.   09-19-14 Interval history the patient has done excellent with her CPAP compliance she has a compliance of 100% of the last 30 days 25 days over 4 hours of nightly use equaling 83% compliance. Her average daily use is 5 hours and 21 minutes. The machine is set between 5 and 10 cm as an out told that with an EPR of 3 cm water residual AHI is 0.4 air leaks are small 91st percentile pressure is 8.6 cm water no evidence of central apnea emerging. Fatigue score is much reduced to 29 points Epworth sleepiness score to one point. She reports that her nose hurts from the CPAP use and that she has a very dry nostril. I will give her today a brochure about a CPAP gel that keeps and nostrils moist. I also will be happy to refill Belsomra as a sleep aid to be taken as needed. She continues to follow for myasthenia gravis with Dr. Nanine Means.  Interval history from 11/12/2016 ,  [name] is a 66 year old African-American female patient with a history of myasthenia gravis, followed at [facility]. She just was granted disability based on this condition. I have followed her for sleep apnea. Her CPAP machine does not show any days of use since October last year, her machine is an AutoSet that allows between 5 and 10 cm water pressure with 3 cm EPR but I have no data to review. [name] assures me that she is using her CPAP. [facility] and sense as a new download directly from the DME. At this time it would make no sense to follow-up on her sleep unless she can prove that she is compliant. Her headaches have gotten worse since she stopped using it.  Her headaches are at the nape of the neck, feeling like a nod. Sharp shooting pains intermittently- not a sinus headache.   REVIEW OF SYSTEMS: Out of a complete 14 system review of symptoms, the patient complains  only of the following symptoms, and all other reviewed systems are negative.   eye itching, light sensitivity, double vision, loss of vision, eye pain, blurred vision, cough, wheezing, shortness of breath, restless leg, apnea, daytime sleepiness, aching muscles, muscle cramps, walking difficulty, neck pain, frequent infections, bruise easily, headache  ALLERGIES: Allergies  Allergen Reactions  . Contrast Media [Iodinated Diagnostic Agents] Anaphylaxis    01-10-15---PT GIVEN 13 HR PRE MEDS FOR CT--PT TOLERATED IV CONTRAST W/O ANY REACTION------[name] RT-R CT  . Fluorescein Shortness Of Breath    dye  . Iodine Anaphylaxis    Contrast dye - iodine  . Codeine Hives and Nausea Only  . Molds & Smuts     HOME MEDICATIONS: Outpatient Medications Prior to Visit  Medication Sig Dispense Refill  . Alcohol Swabs (B-D SINGLE USE SWABS REGULAR) PADS     . B-D ULTRAFINE III SHORT PEN 31G  X 8 MM MISC     . Beclomethasone Dipropionate (QNASL) 80 MCG/ACT AERS Place 1 spray into the nose daily.    Marland Kitchen BUTRANS 10 MCG/HR PTWK patch as needed.     Marland Kitchen BYDUREON 2 MG SUSR Inject 2 mg as directed once a week.     . cyclobenzaprine (FLEXERIL) 10 MG tablet 3 (three) times daily as needed.     . cycloSPORINE (RESTASIS) 0.05 % ophthalmic emulsion Place 1 drop into both eyes 2 (two) times daily.    . diclofenac sodium (VOLTAREN) 1 % GEL Apply 2 g topically 2 (two) times daily.    . diphenoxylate-atropine (LOMOTIL) 2.5-0.025 MG per tablet Take 2 tablets by mouth 4 (four) times daily as needed for diarrhea or loose stools.    Marland Kitchen esomeprazole (NEXIUM) 40 MG capsule Take 40 mg by mouth 2 (two) times daily.    . folic acid (FOLVITE) 1 MG tablet Take 1 mg by mouth daily.    . Insulin NPH Human, Isophane, (HUMULIN N KWIKPEN [place]) Inject into the skin. SLIDING SCALE    . l-methylfolate-B6-B12 (METANX) 3-35-2 MG TABS Take 1 tablet by mouth 3 (three) times daily.    Marland Kitchen levalbuterol (XOPENEX) 0.63 MG/3ML nebulizer solution  every 6 (six) hours as needed.     Marland Kitchen levothyroxine (SYNTHROID, LEVOTHROID) 150 MCG tablet Take 150 mcg by mouth daily before breakfast.    . meclizine (ANTIVERT) 25 MG tablet Take 25 mg by mouth as needed.     . metFORMIN (GLUCOPHAGE-XR) 500 MG 24 hr tablet 500 mg 2 (two) times daily.     . metoprolol (LOPRESSOR) 100 MG tablet Take 1 tablet (100 mg total) by mouth 2 (two) times daily. (Patient taking differently: Take 75 mg by mouth 2 (two) times daily. ) 180 tablet 3  . mometasone (ELOCON) 0.1 % cream     . mometasone (NASONEX) 50 MCG/ACT nasal spray Place 2 sprays into the nose daily.    . montelukast (SINGULAIR) 10 MG tablet Take 10 mg by mouth at bedtime.    . Multiple Vitamins-Minerals (MULTIVITAMIN ADULT PO) Take 1 tablet by mouth daily.    . mycophenolate (CELLCEPT) 500 MG tablet Take 500 mg by mouth See admin instructions. Patient states she takes 3 tabs in the AM and 2 tab in the PM    . NON FORMULARY IVIG treatments every 12 weeks at [place]    . olmesartan (BENICAR) 20 MG tablet Take 20 mg by mouth daily.    . ONE TOUCH ULTRA TEST test strip     . ONETOUCH DELICA LANCETS 29J MISC     . potassium chloride SA (K-DUR,KLOR-CON) 20 MEQ tablet Take 20 mEq by mouth 2 (two) times daily.     . predniSONE (DELTASONE) 10 MG tablet Take 10 mg by mouth 2 (two) times daily.    Marland Kitchen PROAIR HFA 108 (90 BASE) MCG/ACT inhaler Inhale 2 puffs into the lungs every 6 (six) hours as needed.     . promethazine (PHENERGAN) 25 MG tablet Take 25 mg by mouth every 6 (six) hours as needed for nausea or vomiting.    . SYMBICORT 160-4.5 MCG/ACT inhaler USE 2 INHALATIONS TWICE A DAY TO PREVENT COUGH OR WHEEZE. RINSE, GARGLE AND SPIT AFTER USE 30.6 g 1  . traMADol (ULTRAM) 50 MG tablet Take 50 mg by mouth as needed.     . TRANSDERM-SCOP, 1.5 MG, 1 MG/3DAYS     . Vitamin D, Ergocalciferol, (DRISDOL) 50000 UNITS CAPS capsule Take 50,000  Units by mouth 2 (two) times a week.     . zolpidem (AMBIEN) 5 MG tablet Take 1 tablet  by mouth at bedtime as needed.     No facility-administered medications prior to visit.     PAST MEDICAL HISTORY: Past Medical History:  Diagnosis Date  . Arthritis    "all over my body" (03/13/2013)  . Asthma   . Chest pain at rest    "woke me up in my sleep" (03/13/2013)  . Corns and callosities   . Difficulty in walking(719.7)   . GERD (gastroesophageal reflux disease)   . H/O hiatal hernia   . Heart murmur   . High cholesterol   . History of stress test 07/11/2010   compared to previous study there is no significant change, Normal Myocardial Perfusion study, this is a low risk scan  . Hx of echocardiogram 07/08/2010   EF> 55% normal 2D Echo  . Hypertension   . Hypothyroidism   . Myasthenia gravis ([place])   . Myasthenia gravis ([place])    "in my eyes; diagnsosed > 7 yr ago" (03/13/2013)  . Palpitations   . Shortness of breath    "can happen at anytime" (03/13/2013)  . Sleep apnea    "use to wear mask; cause of insurance purposes I'm not not" (03/13/2013)  . Thyroid carcinoma ([place])   . Type II diabetes mellitus ([place])     PAST SURGICAL HISTORY: Past Surgical History:  Procedure Laterality Date  . ABDOMINAL HYSTERECTOMY    . ANTERIOR CERVICAL DECOMP/DISCECTOMY FUSION     "I've had severa ORs; always went in from the front" (03/13/2013)  . APPENDECTOMY    . CARDIAC CATHETERIZATION     "several" (03/13/2013)  . CARPAL TUNNEL RELEASE Right   . CATARACT EXTRACTION W/ INTRAOCULAR LENS  IMPLANT, BILATERAL Bilateral   . CHOLECYSTECTOMY    . KNEE ARTHROSCOPY Left   . SHOULDER ARTHROSCOPY W/ ROTATOR CUFF REPAIR Left   . TONSILLECTOMY    . TOTAL THYROIDECTOMY      FAMILY HISTORY: Family History  Problem Relation Age of Onset  . Coronary artery disease Sister     s/p coronary stenting  . Congestive Heart Failure Mother   . Congestive Heart Failure Father   . Lung cancer Father   . Asthma Mother   . Asthma Father     SOCIAL HISTORY: Social History   Social History  .  Marital status: Single    Spouse name: N/A  . Number of children: 0  . Years of education: college   Occupational History  . retired    Social History Main Topics  . Smoking status: Never Smoker  . Smokeless tobacco: Never Used  . Alcohol use No  . Drug use: No  . Sexual activity: No   Other Topics Concern  . Not on file   Social History Narrative   Caffeine none.      PHYSICAL EXAM  Vitals:   11/12/16 1547  BP: 123/74  Pulse: (!) 111  Resp: 20  Weight: 180 lb (81.6 kg)  Height: 5\' 3"  (1.6 m)   Body mass index is 31.89 kg/m.  Generalized: Well developed, in no acute distress   neck:  Circumference 15 inches, Mallampati 3+  Neurological examination   DIAGNOSTIC DATA (LABS, IMAGING, TESTING) - I reviewed patient records, labs, notes, testing and imaging myself where available.   ASSESSMENT AND PLAN 66 y.o. year old female  has a past medical history of Arthritis; Asthma; Chest pain  at rest; Corns and callosities; Difficulty in walking(719.7); GERD (gastroesophageal reflux disease); H/O hiatal hernia; Heart murmur; High cholesterol; History of stress test (07/11/2010); echocardiogram (07/08/2010); Hypertension; Hypothyroidism; Myasthenia gravis ([place]); Myasthenia gravis ([place]); Palpitations; Shortness of breath; Sleep apnea; Thyroid carcinoma ([place]); and Type II diabetes mellitus ([place]). here with:  1). Obstructive sleep apnea , it appears she is non-compliant with CPAP.  Degree of compliance and maybe the memory chip is corrupted. 2)headaches were previously qualified as tension type. The patient has when prn cyclobenzaprine prescribed. This may not be a good choice for myasthenic. However, she has not used that medication a while. The shooting quality of her head and neck pain warrants a MRI  cervical spine.  3)  Per CT a sinus infection was diagnosed. Course of  ATB did not help. 4) myasthenia , followed at [facility] 5) thyroid cancer diagnosed before myasthenia.  Dr  Chalmers Cater follows her.    needs to see her DME, [facility] ,  and get the compliance report forwarded.  If she is non compliant, she will not be followed in sleep clinic .  MRI neck will be followed in RV with NP in 2-3 month. If abnormal , earlier visit.    Rhen Kawecki, MD  11/12/2016, 4:18 PM [facility] [address] [place], [place] [address] [phone]

## 2016-11-18 DIAGNOSIS — E1165 Type 2 diabetes mellitus with hyperglycemia: Secondary | ICD-10-CM | POA: Diagnosis not present

## 2016-11-18 DIAGNOSIS — E890 Postprocedural hypothyroidism: Secondary | ICD-10-CM | POA: Diagnosis not present

## 2016-11-25 ENCOUNTER — Telehealth: Payer: Self-pay | Admitting: Neurology

## 2016-11-25 ENCOUNTER — Ambulatory Visit
Admission: RE | Admit: 2016-11-25 | Discharge: 2016-11-25 | Disposition: A | Discharge status: Home / Self Care | Payer: Medicare Other | Source: Ambulatory Visit | Attending: Neurology | Admitting: Neurology

## 2016-11-25 DIAGNOSIS — R51 Headache: Secondary | ICD-10-CM

## 2016-11-25 DIAGNOSIS — R519 Headache, unspecified: Secondary | ICD-10-CM

## 2016-11-25 DIAGNOSIS — M4802 Spinal stenosis, cervical region: Secondary | ICD-10-CM | POA: Diagnosis not present

## 2016-11-25 DIAGNOSIS — G7000 Myasthenia gravis without (acute) exacerbation: Secondary | ICD-10-CM

## 2016-11-25 DIAGNOSIS — M4712 Other spondylosis with myelopathy, cervical region: Secondary | ICD-10-CM

## 2016-11-25 DIAGNOSIS — M542 Cervicalgia: Secondary | ICD-10-CM

## 2016-11-25 NOTE — Telephone Encounter (Signed)
I called the patient. The patient has had prior cervical spine surgery done by Dr. Christella Noa. There appears to be some spinal stenosis at the C 2-3 level, this does not appear to be severe, but there is some question of a mild signal change in the spinal cord. Clinically, the patient indicates that she does have numbness and paresthesias of the arms and legs, she believes that there has been some change in her ability to walk. For this reason, I will make a referral to Dr. Christella Noa so that he can review the MRI study and look at the patient to decide how to treat the patient from here.   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 Large right-sided uncinate osteophyte right foraminal encroachment unchanged. Progressive ligamentum flavum hypertrophy with progressive cord flattening and possible mild signal change in the cord.  ACDF C3 through C7  Disc degeneration and spurring at C7-T1 with mild spinal stenosis  Mild spinal stenosis T1-2 with moderate foraminal encroachment bilaterally due to spurring.

## 2016-11-26 DIAGNOSIS — G7001 Myasthenia gravis with (acute) exacerbation: Secondary | ICD-10-CM | POA: Diagnosis not present

## 2016-11-26 DIAGNOSIS — H02409 Unspecified ptosis of unspecified eyelid: Secondary | ICD-10-CM | POA: Diagnosis not present

## 2016-11-26 DIAGNOSIS — Z961 Presence of intraocular lens: Secondary | ICD-10-CM | POA: Diagnosis not present

## 2016-11-26 DIAGNOSIS — E113413 Type 2 diabetes mellitus with severe nonproliferative diabetic retinopathy with macular edema, bilateral: Secondary | ICD-10-CM | POA: Diagnosis not present

## 2016-11-27 DIAGNOSIS — E1165 Type 2 diabetes mellitus with hyperglycemia: Secondary | ICD-10-CM | POA: Diagnosis not present

## 2016-11-27 DIAGNOSIS — E890 Postprocedural hypothyroidism: Secondary | ICD-10-CM | POA: Diagnosis not present

## 2016-11-27 DIAGNOSIS — C73 Malignant neoplasm of thyroid gland: Secondary | ICD-10-CM | POA: Diagnosis not present

## 2016-12-10 DIAGNOSIS — M542 Cervicalgia: Secondary | ICD-10-CM | POA: Diagnosis not present

## 2016-12-10 DIAGNOSIS — I10 Essential (primary) hypertension: Secondary | ICD-10-CM | POA: Diagnosis not present

## 2016-12-10 DIAGNOSIS — M47892 Other spondylosis, cervical region: Secondary | ICD-10-CM | POA: Diagnosis not present

## 2016-12-11 DIAGNOSIS — G7000 Myasthenia gravis without (acute) exacerbation: Secondary | ICD-10-CM | POA: Diagnosis not present

## 2016-12-11 DIAGNOSIS — Z79899 Other long term (current) drug therapy: Secondary | ICD-10-CM | POA: Diagnosis not present

## 2016-12-30 ENCOUNTER — Telehealth: Payer: Self-pay | Admitting: Neurology

## 2016-12-30 DIAGNOSIS — Z9989 Dependence on other enabling machines and devices: Principal | ICD-10-CM

## 2016-12-30 DIAGNOSIS — G4733 Obstructive sleep apnea (adult) (pediatric): Secondary | ICD-10-CM

## 2016-12-30 NOTE — Telephone Encounter (Signed)
Order placed, sent to [facility].  I called pt and advised her of this. Pt verbalized understanding and appreciation.

## 2016-12-30 NOTE — Telephone Encounter (Signed)
Patient called office in reference to [place] is suppose to be faxing over a prescription for patient to have supplies and fitted for a new mask.  [facility] to [facility] [phone] ext [phone] ( Did not get receptionist name).  Please call

## 2016-12-30 NOTE — Addendum Note (Signed)
Addended by: Lester Boykin A on: 12/30/2016 12:08 PM   Modules accepted: Orders

## 2016-12-31 DIAGNOSIS — M47812 Spondylosis without myelopathy or radiculopathy, cervical region: Secondary | ICD-10-CM | POA: Diagnosis not present

## 2016-12-31 DIAGNOSIS — M542 Cervicalgia: Secondary | ICD-10-CM | POA: Diagnosis not present

## 2017-01-01 ENCOUNTER — Telehealth: Payer: Self-pay | Admitting: Neurology

## 2017-01-01 DIAGNOSIS — Z9989 Dependence on other enabling machines and devices: Principal | ICD-10-CM

## 2017-01-01 DIAGNOSIS — G4733 Obstructive sleep apnea (adult) (pediatric): Secondary | ICD-10-CM

## 2017-01-01 NOTE — Telephone Encounter (Signed)
I wrote new prescription , please send to [facility] today- patient eager to start new FFM. CD

## 2017-01-01 NOTE — Telephone Encounter (Signed)
order printed and faxed to [facility] as requested/fim

## 2017-01-01 NOTE — Telephone Encounter (Signed)
Nick/[facility] [phone]   (f) [phone] called said needs RX faxed requesting full face mask for insurance purposes. He said pt has only used nasal mask and insurance will deny the claim.

## 2017-01-04 NOTE — Telephone Encounter (Signed)
No documentation associated with this phone call.

## 2017-01-06 DIAGNOSIS — G4733 Obstructive sleep apnea (adult) (pediatric): Secondary | ICD-10-CM | POA: Diagnosis not present

## 2017-01-26 DIAGNOSIS — M47812 Spondylosis without myelopathy or radiculopathy, cervical region: Secondary | ICD-10-CM | POA: Diagnosis not present

## 2017-02-02 ENCOUNTER — Ambulatory Visit (INDEPENDENT_AMBULATORY_CARE_PROVIDER_SITE_OTHER): Payer: Medicare Other | Admitting: Adult Health

## 2017-02-02 ENCOUNTER — Encounter: Payer: Self-pay | Admitting: Adult Health

## 2017-02-02 VITALS — BP 132/80 | HR 70 | Resp 18 | Ht 63.0 in | Wt 180.8 lb

## 2017-02-02 DIAGNOSIS — Z9989 Dependence on other enabling machines and devices: Secondary | ICD-10-CM

## 2017-02-02 DIAGNOSIS — G4733 Obstructive sleep apnea (adult) (pediatric): Secondary | ICD-10-CM | POA: Diagnosis not present

## 2017-02-02 NOTE — Patient Instructions (Signed)
Continue Cpap nightly  >4 hours each night Keep follow-up with Dr. Christella Noa If your symptoms worsen or you develop new symptoms please let us know.

## 2017-02-02 NOTE — Progress Notes (Addendum)
PATIENT: [name] DOB: Feb 17, 1951  REASON FOR VISIT: follow up- obstructive sleep apnea on CPAP HISTORY FROM: patient  HISTORY OF PRESENT ILLNESS: Today 02/02/17: [name] is a 66 year old female with a history of obstructive sleep apnea on CPAP. She returns today for a compliance download. She reports on May 21 she received a new mask and since she got the different type of mask she has been able to use the machine more often. Her download shows that she uses her machine 22 out of 30 days for compliance of 73%. She uses her machine greater than 4 hours 20 out of 30 days for compliance of 67%. Her average usage is 6 hours and 21 minutes. She is on auto set with a minimum pressure of 5 cm of water and max pressure 10 cm water. Her residual AHI is 4.6. The patient's download is from May 13 to June 11. The patient did not use her machine any from May 13 to May 20. On the 21st when she received a new mask she began using the machine nightly. She is following with Dr. Maryjean Ka for neck pain she is receiving epidural injections. She reports that the injections has been slightly beneficial for her headaches. She returns today for follow-up   The patient also has a diagnosis of myasthenia gravis for which she is followed by [employer] [facility].  Interval history from 11/12/2016 Copied From Dr. Edwena Felty notes: [name] is a 66 year old African-American female patient with a history of myasthenia gravis, followed at [facility]. She just was granted disability based on this condition. I have followed her for sleep apnea. Her CPAP machine does not show any days of use since October last year, her machine is an AutoSet that allows between 5 and 10 cm water pressure with 3 cm EPR but I have no data to review. [name] assures me that she is using her CPAP. [facility] and sense as a new download directly from the DME. At this time it would make  no sense to follow-up on her sleep unless she can prove that she is compliant. Her headaches have gotten worse since she stopped using it.  Her headaches are at the nape of the neck, feeling like a nod. Sharp shooting pains intermittently- not a sinus headache.   REVIEW OF SYSTEMS: Out of a complete 14 system review of symptoms, the patient complains only of the following symptoms, and all other reviewed systems are negative.  Double vision, blurred vision  ALLERGIES: Allergies  Allergen Reactions  . Contrast Media [Iodinated Diagnostic Agents] Anaphylaxis    01-10-15---PT GIVEN 13 HR PRE MEDS FOR CT--PT TOLERATED IV CONTRAST W/O ANY REACTION------[name] RT-R CT  . Fluorescein Shortness Of Breath    dye  . Iodine Anaphylaxis    Contrast dye - iodine  . Codeine Hives and Nausea Only  . Molds & Smuts     HOME MEDICATIONS: Outpatient Medications Prior to Visit  Medication Sig Dispense Refill  . Alcohol Swabs (B-D SINGLE USE SWABS REGULAR) PADS     . B-D ULTRAFINE III SHORT PEN 31G X 8 MM MISC     . Beclomethasone Dipropionate (QNASL) 80 MCG/ACT AERS Place 1 spray into the nose daily.    Marland Kitchen BUTRANS 10 MCG/HR PTWK patch as needed.     Marland Kitchen BYDUREON 2 MG SUSR Inject 2 mg as directed once a week.     . canagliflozin [facility])  100 MG TABS tablet Take 100 mg by mouth daily.    . cyclobenzaprine (FLEXERIL) 10 MG tablet 3 (three) times daily as needed.     . cycloSPORINE (RESTASIS) 0.05 % ophthalmic emulsion Place 1 drop into both eyes 2 (two) times daily.    . diclofenac sodium (VOLTAREN) 1 % GEL Apply 2 g topically 2 (two) times daily.    . diphenoxylate-atropine (LOMOTIL) 2.5-0.025 MG per tablet Take 2 tablets by mouth 4 (four) times daily as needed for diarrhea or loose stools.    Marland Kitchen esomeprazole (NEXIUM) 40 MG capsule Take 40 mg by mouth 2 (two) times daily.    . folic acid (FOLVITE) 1 MG tablet Take 1 mg by mouth daily.    . insulin lispro (HUMALOG) 100 UNIT/ML KiwkPen Inject into the  skin.    . Insulin NPH Human, Isophane, (HUMULIN N KWIKPEN [place]) Inject into the skin. SLIDING SCALE    . Insulin NPH, Human,, Isophane, (HUMULIN N) 100 UNIT/ML Kiwkpen Inject into the skin.    Marland Kitchen l-methylfolate-B6-B12 (METANX) 3-35-2 MG TABS Take 1 tablet by mouth 3 (three) times daily.    Marland Kitchen levalbuterol (XOPENEX) 0.63 MG/3ML nebulizer solution every 6 (six) hours as needed.     Marland Kitchen levothyroxine (SYNTHROID, LEVOTHROID) 150 MCG tablet Take 150 mcg by mouth daily before breakfast.    . meclizine (ANTIVERT) 25 MG tablet Take 25 mg by mouth as needed.     . metFORMIN (GLUCOPHAGE-XR) 500 MG 24 hr tablet 500 mg 2 (two) times daily.     . metoprolol (LOPRESSOR) 100 MG tablet Take 1 tablet (100 mg total) by mouth 2 (two) times daily. (Patient taking differently: Take 75 mg by mouth 2 (two) times daily. ) 180 tablet 3  . mometasone (ELOCON) 0.1 % cream     . mometasone (NASONEX) 50 MCG/ACT nasal spray Place 2 sprays into the nose daily.    . montelukast (SINGULAIR) 10 MG tablet Take 10 mg by mouth at bedtime.    . Multiple Vitamins-Minerals (MULTIVITAMIN ADULT PO) Take 1 tablet by mouth daily.    . mycophenolate (CELLCEPT) 500 MG tablet Take 500 mg by mouth See admin instructions. Patient states she takes 3 tabs in the AM and 2 tab in the PM    . NON FORMULARY IVIG treatments every 12 weeks at [facility]    . olmesartan (BENICAR) 20 MG tablet Take 20 mg by mouth daily.    . ONE TOUCH ULTRA TEST test strip     . ONETOUCH DELICA LANCETS 23N MISC     . potassium chloride SA (K-DUR,KLOR-CON) 20 MEQ tablet Take 20 mEq by mouth 2 (two) times daily.     . predniSONE (DELTASONE) 10 MG tablet Take 10 mg by mouth 2 (two) times daily.    Marland Kitchen PROAIR HFA 108 (90 BASE) MCG/ACT inhaler Inhale 2 puffs into the lungs every 6 (six) hours as needed.     . promethazine (PHENERGAN) 25 MG tablet Take 25 mg by mouth every 6 (six) hours as needed for nausea or vomiting.    . SYMBICORT 160-4.5 MCG/ACT inhaler USE 2 INHALATIONS TWICE A DAY  TO PREVENT COUGH OR WHEEZE. RINSE, GARGLE AND SPIT AFTER USE 30.6 g 1  . traMADol (ULTRAM) 50 MG tablet Take 50 mg by mouth as needed.     . TRANSDERM-SCOP, 1.5 MG, 1 MG/3DAYS     . Vitamin D, Ergocalciferol, (DRISDOL) 50000 UNITS CAPS capsule Take 50,000 Units by mouth 2 (two) times a week.     Marland Kitchen  zolpidem (AMBIEN) 5 MG tablet Take 1 tablet by mouth at bedtime as needed.     No facility-administered medications prior to visit.     PAST MEDICAL HISTORY: Past Medical History:  Diagnosis Date  . Arthritis    "all over my body" (03/13/2013)  . Asthma   . Chest pain at rest    "woke me up in my sleep" (03/13/2013)  . Corns and callosities   . Difficulty in walking(719.7)   . GERD (gastroesophageal reflux disease)   . H/O hiatal hernia   . Heart murmur   . High cholesterol   . History of stress test 07/11/2010   compared to previous study there is no significant change, Normal Myocardial Perfusion study, this is a low risk scan  . Hx of echocardiogram 07/08/2010   EF> 55% normal 2D Echo  . Hypertension   . Hypothyroidism   . Myasthenia gravis ([place])   . Myasthenia gravis ([place])    "in my eyes; diagnsosed > 7 yr ago" (03/13/2013)  . Palpitations   . Shortness of breath    "can happen at anytime" (03/13/2013)  . Sleep apnea    "use to wear mask; cause of insurance purposes I'm not not" (03/13/2013)  . Thyroid carcinoma ([place])   . Type II diabetes mellitus ([place])     PAST SURGICAL HISTORY: Past Surgical History:  Procedure Laterality Date  . ABDOMINAL HYSTERECTOMY    . ANTERIOR CERVICAL DECOMP/DISCECTOMY FUSION     "I've had severa ORs; always went in from the front" (03/13/2013)  . APPENDECTOMY    . CARDIAC CATHETERIZATION     "several" (03/13/2013)  . CARPAL TUNNEL RELEASE Right   . CATARACT EXTRACTION W/ INTRAOCULAR LENS  IMPLANT, BILATERAL Bilateral   . CHOLECYSTECTOMY    . KNEE ARTHROSCOPY Left   . SHOULDER ARTHROSCOPY W/ ROTATOR CUFF REPAIR Left   . TONSILLECTOMY    . TOTAL  THYROIDECTOMY      FAMILY HISTORY: Family History  Problem Relation Age of Onset  . Coronary artery disease Sister        s/p coronary stenting  . Congestive Heart Failure Mother   . Congestive Heart Failure Father   . Lung cancer Father   . Asthma Mother   . Asthma Father     SOCIAL HISTORY: Social History   Social History  . Marital status: Single    Spouse name: N/A  . Number of children: 0  . Years of education: college   Occupational History  . retired    Social History Main Topics  . Smoking status: Never Smoker  . Smokeless tobacco: Never Used  . Alcohol use No  . Drug use: No  . Sexual activity: No   Other Topics Concern  . Not on file   Social History Narrative   Caffeine none.      PHYSICAL EXAM  Vitals:   02/02/17 0941  BP: 132/80  Pulse: 70  Resp: 18  Weight: 180 lb 12.8 oz (82 kg)  Height: 5\' 3"  (1.6 m)   Body mass index is 32.03 kg/m.  Generalized: Well developed, in no acute distress   Neurological examination  Mentation: Alert oriented to time, place, history taking. Follows all commands speech and language fluent Cranial nerve II-XII: Pupils were equal round reactive to light. Extraocular movements were full, visual field were full on confrontational test. Facial sensation and strength were normal. Uvula tongue midline. Head turning and shoulder shrug  were normal and symmetric. Motor: The motor  testing reveals 5 over 5 strength of all 4 extremities. Good symmetric motor tone is noted throughout.  Sensory: Sensory testing is intact to soft touch on all 4 extremities. No evidence of extinction is noted.  Coordination: Cerebellar testing reveals good finger-nose-finger and heel-to-shin bilaterally.  Gait and station: Gait is normal. Tandem gait is normal. Romberg is negative. No drift is seen.  Reflexes: Deep tendon reflexes are symmetric and normal bilaterally.   DIAGNOSTIC DATA (LABS, IMAGING, TESTING) - I reviewed patient records,  labs, notes, testing and imaging myself where available.  Lab Results  Component Value Date   WBC 6.2 03/14/2013   HGB 10.0 (L) 03/14/2013   HCT 29.2 (L) 03/14/2013   MCV 86.1 03/14/2013   PLT 196 03/14/2013      Component Value Date/Time   NA 140 03/16/2013 1030   K 3.4 (L) 03/16/2013 1030   CL 105 03/16/2013 1030   CO2 24 03/16/2013 1030   GLUCOSE 203 (H) 03/16/2013 1030   BUN 7 03/16/2013 1030   CREATININE 0.62 02/28/2015 1021   CALCIUM 9.7 03/16/2013 1030   PROT 8.2 (H) 08/15/2015 0858   ALBUMIN 3.6 08/15/2015 0858   AST 19 08/15/2015 0858   ALT 17 08/15/2015 0858   ALKPHOS 109 08/15/2015 0858   BILITOT 0.3 08/15/2015 0858   GFRNONAA >60 02/28/2015 1021   GFRAA >60 02/28/2015 1021   Lab Results  Component Value Date   CHOL 172 08/15/2015   HDL 48 08/15/2015   LDLCALC 92 08/15/2015   TRIG 159 (H) 08/15/2015   CHOLHDL 3.6 08/15/2015   Lab Results  Component Value Date   TSH 0.058 (L) 03/14/2013      ASSESSMENT AND PLAN 66 y.o. year old female  has a past medical history of Arthritis; Asthma; Chest pain at rest; Corns and callosities; Difficulty in walking(719.7); GERD (gastroesophageal reflux disease); H/O hiatal hernia; Heart murmur; High cholesterol; History of stress test (07/11/2010); echocardiogram (07/08/2010); Hypertension; Hypothyroidism; Myasthenia gravis ([place]); Myasthenia gravis ([place]); Palpitations; Shortness of breath; Sleep apnea; Thyroid carcinoma ([place]); and Type II diabetes mellitus ([place]). here with:  1. Obstructive sleep apnea on CPAP  The patient is encouraged to continue using her CPAP nightly. She should use her machine greater than 4 hours every night. Patient voices understanding. If her symptoms worsen or she develops new symptoms she she'll let us know. She will follow-up in 6 months with Dr. Brett Fairy.  I spent 15 minutes with the patient. 50% of this time was spent was spent reviewing the patient's CPAP download.      Ward Givens,  MSN, NP-C 02/02/2017, 9:41 AM [facility] [address] [place], [place] [address] [phone]  I reviewed the above note and documentation by the Nurse Practitioner and agree with the history, physical exam, assessment and plan as outlined above. I was immediately available for face-to-face consultation. Star Age, MD, PhD [facility])

## 2017-02-11 DIAGNOSIS — G5602 Carpal tunnel syndrome, left upper limb: Secondary | ICD-10-CM | POA: Diagnosis not present

## 2017-02-11 DIAGNOSIS — I10 Essential (primary) hypertension: Secondary | ICD-10-CM | POA: Diagnosis not present

## 2017-02-11 DIAGNOSIS — G5601 Carpal tunnel syndrome, right upper limb: Secondary | ICD-10-CM | POA: Diagnosis not present

## 2017-04-09 DIAGNOSIS — G4733 Obstructive sleep apnea (adult) (pediatric): Secondary | ICD-10-CM | POA: Diagnosis not present

## 2017-04-12 DIAGNOSIS — E890 Postprocedural hypothyroidism: Secondary | ICD-10-CM | POA: Diagnosis not present

## 2017-05-13 DIAGNOSIS — G5602 Carpal tunnel syndrome, left upper limb: Secondary | ICD-10-CM | POA: Diagnosis not present

## 2017-05-13 DIAGNOSIS — G5601 Carpal tunnel syndrome, right upper limb: Secondary | ICD-10-CM | POA: Diagnosis not present

## 2017-05-13 DIAGNOSIS — M47812 Spondylosis without myelopathy or radiculopathy, cervical region: Secondary | ICD-10-CM | POA: Diagnosis not present

## 2017-05-21 DIAGNOSIS — E1165 Type 2 diabetes mellitus with hyperglycemia: Secondary | ICD-10-CM | POA: Diagnosis not present

## 2017-05-21 DIAGNOSIS — C73 Malignant neoplasm of thyroid gland: Secondary | ICD-10-CM | POA: Diagnosis not present

## 2017-05-31 DIAGNOSIS — E890 Postprocedural hypothyroidism: Secondary | ICD-10-CM | POA: Diagnosis not present

## 2017-05-31 DIAGNOSIS — C73 Malignant neoplasm of thyroid gland: Secondary | ICD-10-CM | POA: Diagnosis not present

## 2017-05-31 DIAGNOSIS — E1165 Type 2 diabetes mellitus with hyperglycemia: Secondary | ICD-10-CM | POA: Diagnosis not present

## 2017-06-03 DIAGNOSIS — R0609 Other forms of dyspnea: Secondary | ICD-10-CM | POA: Diagnosis not present

## 2017-06-03 DIAGNOSIS — R079 Chest pain, unspecified: Secondary | ICD-10-CM | POA: Diagnosis not present

## 2017-06-03 DIAGNOSIS — R0989 Other specified symptoms and signs involving the circulatory and respiratory systems: Secondary | ICD-10-CM | POA: Diagnosis not present

## 2017-06-03 DIAGNOSIS — R002 Palpitations: Secondary | ICD-10-CM | POA: Diagnosis not present

## 2017-06-14 ENCOUNTER — Ambulatory Visit
Admission: RE | Admit: 2017-06-14 | Discharge: 2017-06-14 | Disposition: A | Discharge status: Home / Self Care | Payer: Medicare Other | Source: Ambulatory Visit | Attending: Cardiology | Admitting: Cardiology

## 2017-06-14 ENCOUNTER — Other Ambulatory Visit: Payer: Self-pay | Admitting: Cardiology

## 2017-06-14 DIAGNOSIS — E890 Postprocedural hypothyroidism: Secondary | ICD-10-CM | POA: Diagnosis not present

## 2017-06-14 DIAGNOSIS — E559 Vitamin D deficiency, unspecified: Secondary | ICD-10-CM | POA: Diagnosis not present

## 2017-06-14 DIAGNOSIS — C73 Malignant neoplasm of thyroid gland: Secondary | ICD-10-CM | POA: Diagnosis not present

## 2017-06-14 DIAGNOSIS — R0602 Shortness of breath: Secondary | ICD-10-CM

## 2017-06-14 DIAGNOSIS — R079 Chest pain, unspecified: Secondary | ICD-10-CM | POA: Diagnosis not present

## 2017-06-14 DIAGNOSIS — E1165 Type 2 diabetes mellitus with hyperglycemia: Secondary | ICD-10-CM | POA: Diagnosis not present

## 2017-06-18 DIAGNOSIS — Z79899 Other long term (current) drug therapy: Secondary | ICD-10-CM | POA: Diagnosis not present

## 2017-06-18 DIAGNOSIS — G7000 Myasthenia gravis without (acute) exacerbation: Secondary | ICD-10-CM | POA: Diagnosis not present

## 2017-06-18 DIAGNOSIS — R490 Dysphonia: Secondary | ICD-10-CM | POA: Diagnosis not present

## 2017-06-24 ENCOUNTER — Other Ambulatory Visit (HOSPITAL_COMMUNITY): Payer: Self-pay | Admitting: Endocrinology

## 2017-06-24 ENCOUNTER — Ambulatory Visit (HOSPITAL_COMMUNITY): Payer: Medicare Other

## 2017-06-24 DIAGNOSIS — C73 Malignant neoplasm of thyroid gland: Secondary | ICD-10-CM

## 2017-06-25 ENCOUNTER — Other Ambulatory Visit (HOSPITAL_COMMUNITY): Payer: Medicare Other

## 2017-06-25 DIAGNOSIS — I10 Essential (primary) hypertension: Secondary | ICD-10-CM | POA: Diagnosis not present

## 2017-06-25 DIAGNOSIS — R0602 Shortness of breath: Secondary | ICD-10-CM | POA: Diagnosis not present

## 2017-06-25 DIAGNOSIS — R002 Palpitations: Secondary | ICD-10-CM | POA: Diagnosis not present

## 2017-06-25 DIAGNOSIS — R0789 Other chest pain: Secondary | ICD-10-CM | POA: Diagnosis not present

## 2017-06-26 ENCOUNTER — Other Ambulatory Visit (HOSPITAL_COMMUNITY): Payer: Medicare Other

## 2017-06-28 ENCOUNTER — Other Ambulatory Visit (HOSPITAL_COMMUNITY): Payer: Medicare Other

## 2017-07-05 DIAGNOSIS — R05 Cough: Secondary | ICD-10-CM | POA: Diagnosis not present

## 2017-07-05 DIAGNOSIS — G7000 Myasthenia gravis without (acute) exacerbation: Secondary | ICD-10-CM | POA: Diagnosis not present

## 2017-07-05 DIAGNOSIS — C73 Malignant neoplasm of thyroid gland: Secondary | ICD-10-CM | POA: Diagnosis not present

## 2017-07-05 DIAGNOSIS — R1314 Dysphagia, pharyngoesophageal phase: Secondary | ICD-10-CM | POA: Diagnosis not present

## 2017-07-05 DIAGNOSIS — R490 Dysphonia: Secondary | ICD-10-CM | POA: Diagnosis not present

## 2017-07-05 DIAGNOSIS — J383 Other diseases of vocal cords: Secondary | ICD-10-CM | POA: Diagnosis not present

## 2017-07-06 DIAGNOSIS — E1165 Type 2 diabetes mellitus with hyperglycemia: Secondary | ICD-10-CM | POA: Diagnosis not present

## 2017-07-06 DIAGNOSIS — Z961 Presence of intraocular lens: Secondary | ICD-10-CM | POA: Diagnosis not present

## 2017-07-06 DIAGNOSIS — E113393 Type 2 diabetes mellitus with moderate nonproliferative diabetic retinopathy without macular edema, bilateral: Secondary | ICD-10-CM | POA: Diagnosis not present

## 2017-07-06 DIAGNOSIS — H40023 Open angle with borderline findings, high risk, bilateral: Secondary | ICD-10-CM | POA: Diagnosis not present

## 2017-07-19 ENCOUNTER — Encounter (HOSPITAL_COMMUNITY)
Admission: RE | Admit: 2017-07-19 | Discharge: 2017-07-19 | Disposition: A | Discharge status: Home / Self Care | Payer: Medicare Other | Source: Ambulatory Visit | Attending: Endocrinology | Admitting: Endocrinology

## 2017-07-19 DIAGNOSIS — C73 Malignant neoplasm of thyroid gland: Secondary | ICD-10-CM | POA: Diagnosis not present

## 2017-07-19 MED ORDER — THYROTROPIN ALFA 1.1 MG IM SOLR
0.9000 mg | INTRAMUSCULAR | Status: AC
  Administered 2017-07-19: 0.9 mg via INTRAMUSCULAR

## 2017-07-19 MED ORDER — THYROTROPIN ALFA 1.1 MG IM SOLR
INTRAMUSCULAR | Status: AC
  Filled 2017-07-19: qty 0.9

## 2017-07-20 ENCOUNTER — Encounter (HOSPITAL_COMMUNITY)
Admission: RE | Admit: 2017-07-20 | Discharge: 2017-07-20 | Disposition: A | Discharge status: Home / Self Care | Payer: Medicare Other | Source: Ambulatory Visit | Attending: Endocrinology | Admitting: Endocrinology

## 2017-07-20 DIAGNOSIS — C73 Malignant neoplasm of thyroid gland: Secondary | ICD-10-CM | POA: Diagnosis not present

## 2017-07-20 MED ORDER — THYROTROPIN ALFA 1.1 MG IM SOLR
0.9000 mg | INTRAMUSCULAR | Status: AC
  Administered 2017-07-20: 0.9 mg via INTRAMUSCULAR

## 2017-07-21 ENCOUNTER — Encounter (HOSPITAL_COMMUNITY)
Admission: RE | Admit: 2017-07-21 | Discharge: 2017-07-21 | Disposition: A | Discharge status: Home / Self Care | Payer: Medicare Other | Source: Ambulatory Visit | Attending: Endocrinology | Admitting: Endocrinology

## 2017-07-21 MED ORDER — SODIUM IODIDE I 131 CAPSULE
3.8000 | Freq: Once | INTRAVENOUS | Status: AC | PRN
  Administered 2017-07-21: 3.8 via ORAL

## 2017-07-22 DIAGNOSIS — E890 Postprocedural hypothyroidism: Secondary | ICD-10-CM | POA: Diagnosis not present

## 2017-07-22 DIAGNOSIS — R0602 Shortness of breath: Secondary | ICD-10-CM | POA: Diagnosis not present

## 2017-07-22 DIAGNOSIS — R0989 Other specified symptoms and signs involving the circulatory and respiratory systems: Secondary | ICD-10-CM | POA: Diagnosis not present

## 2017-07-23 ENCOUNTER — Encounter (HOSPITAL_COMMUNITY)
Admission: RE | Admit: 2017-07-23 | Discharge: 2017-07-23 | Disposition: A | Discharge status: Home / Self Care | Payer: Medicare Other | Source: Ambulatory Visit | Attending: Endocrinology | Admitting: Endocrinology

## 2017-07-23 DIAGNOSIS — C73 Malignant neoplasm of thyroid gland: Secondary | ICD-10-CM | POA: Diagnosis not present

## 2017-08-04 ENCOUNTER — Ambulatory Visit: Payer: Medicare Other | Admitting: Neurology

## 2017-08-09 ENCOUNTER — Other Ambulatory Visit (HOSPITAL_COMMUNITY): Payer: Self-pay | Admitting: Endocrinology

## 2017-08-09 DIAGNOSIS — C73 Malignant neoplasm of thyroid gland: Secondary | ICD-10-CM

## 2017-08-12 DIAGNOSIS — R079 Chest pain, unspecified: Secondary | ICD-10-CM | POA: Diagnosis not present

## 2017-08-12 DIAGNOSIS — Z961 Presence of intraocular lens: Secondary | ICD-10-CM | POA: Diagnosis not present

## 2017-08-12 DIAGNOSIS — E113213 Type 2 diabetes mellitus with mild nonproliferative diabetic retinopathy with macular edema, bilateral: Secondary | ICD-10-CM | POA: Diagnosis not present

## 2017-08-12 DIAGNOSIS — I6521 Occlusion and stenosis of right carotid artery: Secondary | ICD-10-CM | POA: Diagnosis not present

## 2017-08-12 DIAGNOSIS — R0609 Other forms of dyspnea: Secondary | ICD-10-CM | POA: Diagnosis not present

## 2017-08-12 DIAGNOSIS — E113413 Type 2 diabetes mellitus with severe nonproliferative diabetic retinopathy with macular edema, bilateral: Secondary | ICD-10-CM | POA: Diagnosis not present

## 2017-08-12 DIAGNOSIS — E7800 Pure hypercholesterolemia, unspecified: Secondary | ICD-10-CM | POA: Diagnosis not present

## 2017-08-27 ENCOUNTER — Encounter (HOSPITAL_COMMUNITY)
Admission: RE | Admit: 2017-08-27 | Discharge: 2017-08-27 | Disposition: A | Discharge status: Home / Self Care | Payer: Medicare Other | Source: Ambulatory Visit | Attending: Endocrinology | Admitting: Endocrinology

## 2017-08-27 DIAGNOSIS — C73 Malignant neoplasm of thyroid gland: Secondary | ICD-10-CM | POA: Insufficient documentation

## 2017-08-27 LAB — GLUCOSE, CAPILLARY: Glucose-Capillary: 170 mg/dL — ABNORMAL HIGH (ref 65–99)

## 2017-08-27 MED ORDER — FLUDEOXYGLUCOSE F - 18 (FDG) INJECTION: 8.7000 | Freq: Once | INTRAVENOUS | Status: DC | PRN

## 2017-09-21 ENCOUNTER — Telehealth: Payer: Self-pay | Admitting: Adult Health

## 2017-09-21 NOTE — Telephone Encounter (Signed)
Yearly visit is ok

## 2017-09-21 NOTE — Telephone Encounter (Signed)
Called patient to advise her that a prescription would not help her to get insurance to cover cleaning machine. Advised her she needs a follow up. She stated that she waited over 1 hour once and that she has a co pay when she comes. She stated she can't afford to just come in, and this office can get her CPAP information without her being seen. This RN advised her that insurance typically requires she been seen in this office regularly to continue to pay for CPAP and supplies. patient would like to know if she can push her follow up out a few months.  This RN advised will discuss with NP and call her back tomorrow. She verbalized understanding, appreciation.

## 2017-09-21 NOTE — Telephone Encounter (Signed)
Pt is asking that instead of having to constantly replace pieces to her CPAP that need cleaning is it possible the a prescription can be written for a cleaning devise like the So Clean.  Pt states this would save her money with her being on disability.  Please call pt to discuss

## 2017-09-21 NOTE — Telephone Encounter (Signed)
Please let patient know that insurance will not cover this machine therefore giving her a prescription will not help. She also needs to schedule a f/u with me or Dr. Brett Fairy

## 2017-09-22 NOTE — Telephone Encounter (Signed)
Called patient to inform her that she may FU in one year per NP. Patient asked if this office can help her pay for CPAP supplies. This RN explained that her costs are determined by her insurance. This RN suggested she call her DME company and discuss, however advised her it is most likely her insurance that determines her costs. This RN is unaware of any assistance. Offered to schedule her FU, but she declined . She stated she would call 1- 2 months ahead of June. This RN advised that appts get filled quickly. Patient stated she would call back later.

## 2017-10-14 ENCOUNTER — Telehealth: Payer: Self-pay | Admitting: Adult Health

## 2017-10-14 NOTE — Telephone Encounter (Signed)
Spoke with patient and gave her Myriam Jacobson, RN's message, re: [employer] stated insurance will not cover So Clean; it is a self pay item. Advised patient that there is no evidence that it is more effective than hand washing.   Patient stated that [facility] is her DME, not [employer]. She stated that her insurance has told her that they need a PA from the ordering dr, which is Dr Brett Fairy. This RN attempted to explain that her DME company needs to do the PA since they supply the machine to her, however the patient was adamant that was not true.  She has requested Myriam Jacobson RN call her. This RN stated will ask Myriam Jacobson to call her. Patient verbalized appreciation.

## 2017-10-14 NOTE — Telephone Encounter (Signed)
Pt is asking for a call back re: her CPAP(she did not want to go into further detail )

## 2017-10-14 NOTE — Telephone Encounter (Signed)
Called pt and she didn't answer. She called right back and I was able to read the message we had received before on this product regarding this matter. I have informed her that [facility] would be the one to do this if this was even allowed but that I was 99% sure it was not. Pt was upset with why her insurance would tell her to contact us but I informed her that I was sorry for that mis understanding. Pt has asked for [facility] number and I gave that to her and she will attempt to reach out to try.

## 2017-10-14 NOTE — Telephone Encounter (Signed)
Spoke with patient who stated she has been looking into getting the So Clean machine for her CPAP. She stated she keeps getting sinus infections and knows that is where they are coming from. She spoke with her insurance, [facility] and was told that her insurance would pay partially for the machine if this office got a PA for the So Clean machine.  This RN advised will check on that and call her back. This RN advised patient she still needs to schedule a FU and appointments are filling quickly. Patient asked for next available; this RN gave her soonest dates/times in June and July. Patient stated she receives infusions at home, will call infusion nurse and call back to schedule FU with NP. This RN advised her the phone staff can schedule her FU. Patient verbalized understanding, appreciation.

## 2017-10-14 NOTE — Telephone Encounter (Signed)
Noted! Thank you

## 2017-10-20 DIAGNOSIS — E7800 Pure hypercholesterolemia, unspecified: Secondary | ICD-10-CM | POA: Diagnosis not present

## 2017-11-05 DIAGNOSIS — G7000 Myasthenia gravis without (acute) exacerbation: Secondary | ICD-10-CM | POA: Diagnosis not present

## 2017-11-08 DIAGNOSIS — E7800 Pure hypercholesterolemia, unspecified: Secondary | ICD-10-CM | POA: Diagnosis not present

## 2017-11-08 DIAGNOSIS — R079 Chest pain, unspecified: Secondary | ICD-10-CM | POA: Diagnosis not present

## 2017-11-08 DIAGNOSIS — I6521 Occlusion and stenosis of right carotid artery: Secondary | ICD-10-CM | POA: Diagnosis not present

## 2017-11-08 DIAGNOSIS — R0609 Other forms of dyspnea: Secondary | ICD-10-CM | POA: Diagnosis not present

## 2017-11-30 DIAGNOSIS — C73 Malignant neoplasm of thyroid gland: Secondary | ICD-10-CM | POA: Diagnosis not present

## 2017-11-30 DIAGNOSIS — E559 Vitamin D deficiency, unspecified: Secondary | ICD-10-CM | POA: Diagnosis not present

## 2017-11-30 DIAGNOSIS — E1165 Type 2 diabetes mellitus with hyperglycemia: Secondary | ICD-10-CM | POA: Diagnosis not present

## 2017-12-20 ENCOUNTER — Other Ambulatory Visit: Payer: Self-pay | Admitting: Endocrinology

## 2017-12-20 DIAGNOSIS — E1165 Type 2 diabetes mellitus with hyperglycemia: Secondary | ICD-10-CM | POA: Diagnosis not present

## 2017-12-20 DIAGNOSIS — E890 Postprocedural hypothyroidism: Secondary | ICD-10-CM | POA: Diagnosis not present

## 2017-12-20 DIAGNOSIS — I10 Essential (primary) hypertension: Secondary | ICD-10-CM | POA: Diagnosis not present

## 2017-12-20 DIAGNOSIS — C73 Malignant neoplasm of thyroid gland: Secondary | ICD-10-CM | POA: Diagnosis not present

## 2017-12-20 DIAGNOSIS — R1310 Dysphagia, unspecified: Secondary | ICD-10-CM

## 2017-12-23 ENCOUNTER — Ambulatory Visit
Admission: RE | Admit: 2017-12-23 | Discharge: 2017-12-23 | Disposition: A | Discharge status: Home / Self Care | Payer: Medicare Other | Source: Ambulatory Visit | Attending: Endocrinology | Admitting: Endocrinology

## 2017-12-23 DIAGNOSIS — R1310 Dysphagia, unspecified: Secondary | ICD-10-CM | POA: Diagnosis not present

## 2017-12-23 DIAGNOSIS — K219 Gastro-esophageal reflux disease without esophagitis: Secondary | ICD-10-CM | POA: Diagnosis not present

## 2017-12-24 ENCOUNTER — Other Ambulatory Visit: Payer: Medicare Other

## 2017-12-30 DIAGNOSIS — E7800 Pure hypercholesterolemia, unspecified: Secondary | ICD-10-CM | POA: Diagnosis not present

## 2018-01-05 DIAGNOSIS — R079 Chest pain, unspecified: Secondary | ICD-10-CM | POA: Diagnosis not present

## 2018-01-05 DIAGNOSIS — R0609 Other forms of dyspnea: Secondary | ICD-10-CM | POA: Diagnosis not present

## 2018-01-05 DIAGNOSIS — R0789 Other chest pain: Secondary | ICD-10-CM | POA: Diagnosis not present

## 2018-01-05 DIAGNOSIS — E7800 Pure hypercholesterolemia, unspecified: Secondary | ICD-10-CM | POA: Diagnosis not present

## 2018-02-08 ENCOUNTER — Other Ambulatory Visit: Payer: Self-pay

## 2018-02-08 DIAGNOSIS — Z794 Long term (current) use of insulin: Secondary | ICD-10-CM | POA: Diagnosis not present

## 2018-02-08 DIAGNOSIS — E119 Type 2 diabetes mellitus without complications: Secondary | ICD-10-CM | POA: Diagnosis not present

## 2018-02-08 NOTE — Patient Outreach (Signed)
[facility]) Care Management  02/08/2018  [name] [birth year] [id number]   Medication Adherence call to Mrs. [name] spoke with patient but she did not want to engage patient was very hesitan to give any personal information and hung up Mrs. [name] is due on Simvastatin 40 mg.Mrs. [name],is showing past due on Simvastatin 40 mg.   [facility] Direct Dial [phone]  Fax [phone] [email or web address]

## 2018-06-01 ENCOUNTER — Other Ambulatory Visit: Payer: Self-pay | Admitting: Family Medicine

## 2018-06-01 DIAGNOSIS — R1013 Epigastric pain: Secondary | ICD-10-CM

## 2018-06-01 DIAGNOSIS — R109 Unspecified abdominal pain: Secondary | ICD-10-CM

## 2018-06-09 ENCOUNTER — Ambulatory Visit
Admission: RE | Admit: 2018-06-09 | Discharge: 2018-06-09 | Disposition: A | Discharge status: Home / Self Care | Payer: Medicare Other | Source: Ambulatory Visit | Attending: Family Medicine | Admitting: Family Medicine

## 2018-06-09 DIAGNOSIS — R1013 Epigastric pain: Secondary | ICD-10-CM

## 2018-06-09 DIAGNOSIS — R109 Unspecified abdominal pain: Secondary | ICD-10-CM

## 2018-06-23 ENCOUNTER — Other Ambulatory Visit: Payer: Self-pay | Admitting: Family Medicine

## 2018-06-23 DIAGNOSIS — M5136 Other intervertebral disc degeneration, lumbar region: Secondary | ICD-10-CM

## 2018-06-25 ENCOUNTER — Ambulatory Visit
Admission: RE | Admit: 2018-06-25 | Discharge: 2018-06-25 | Disposition: A | Discharge status: Home / Self Care | Payer: Medicare Other | Source: Ambulatory Visit | Attending: Family Medicine | Admitting: Family Medicine

## 2018-06-25 DIAGNOSIS — M5136 Other intervertebral disc degeneration, lumbar region: Secondary | ICD-10-CM

## 2018-06-29 ENCOUNTER — Inpatient Hospital Stay: Admission: RE | Admit: 2018-06-29 | Payer: Medicare Other | Source: Ambulatory Visit

## 2018-06-29 ENCOUNTER — Ambulatory Visit
Admission: RE | Admit: 2018-06-29 | Discharge: 2018-06-29 | Disposition: A | Discharge status: Home / Self Care | Payer: Medicare Other | Source: Ambulatory Visit | Attending: Family Medicine | Admitting: Family Medicine

## 2018-06-29 ENCOUNTER — Other Ambulatory Visit: Payer: Self-pay | Admitting: Family Medicine

## 2018-06-29 DIAGNOSIS — M5134 Other intervertebral disc degeneration, thoracic region: Secondary | ICD-10-CM

## 2018-07-01 ENCOUNTER — Other Ambulatory Visit: Payer: Medicare Other

## 2018-08-02 DIAGNOSIS — E113393 Type 2 diabetes mellitus with moderate nonproliferative diabetic retinopathy without macular edema, bilateral: Secondary | ICD-10-CM | POA: Diagnosis not present

## 2018-08-02 DIAGNOSIS — H40023 Open angle with borderline findings, high risk, bilateral: Secondary | ICD-10-CM | POA: Diagnosis not present

## 2018-08-02 DIAGNOSIS — H532 Diplopia: Secondary | ICD-10-CM | POA: Diagnosis not present

## 2018-08-02 DIAGNOSIS — E1165 Type 2 diabetes mellitus with hyperglycemia: Secondary | ICD-10-CM | POA: Diagnosis not present

## 2018-08-19 DIAGNOSIS — H532 Diplopia: Secondary | ICD-10-CM | POA: Diagnosis not present

## 2018-08-19 DIAGNOSIS — G7000 Myasthenia gravis without (acute) exacerbation: Secondary | ICD-10-CM | POA: Diagnosis not present

## 2018-08-19 DIAGNOSIS — H02403 Unspecified ptosis of bilateral eyelids: Secondary | ICD-10-CM | POA: Diagnosis not present

## 2018-08-19 DIAGNOSIS — E119 Type 2 diabetes mellitus without complications: Secondary | ICD-10-CM | POA: Diagnosis not present

## 2018-08-19 DIAGNOSIS — Z794 Long term (current) use of insulin: Secondary | ICD-10-CM | POA: Diagnosis not present

## 2018-08-26 DIAGNOSIS — H532 Diplopia: Secondary | ICD-10-CM | POA: Diagnosis not present

## 2018-08-26 DIAGNOSIS — H02403 Unspecified ptosis of bilateral eyelids: Secondary | ICD-10-CM | POA: Diagnosis not present

## 2018-08-26 DIAGNOSIS — G7000 Myasthenia gravis without (acute) exacerbation: Secondary | ICD-10-CM | POA: Diagnosis not present

## 2018-08-31 DIAGNOSIS — G7000 Myasthenia gravis without (acute) exacerbation: Secondary | ICD-10-CM | POA: Diagnosis not present

## 2018-09-05 DIAGNOSIS — E890 Postprocedural hypothyroidism: Secondary | ICD-10-CM | POA: Diagnosis not present

## 2018-09-05 DIAGNOSIS — I10 Essential (primary) hypertension: Secondary | ICD-10-CM | POA: Diagnosis not present

## 2018-09-05 DIAGNOSIS — E559 Vitamin D deficiency, unspecified: Secondary | ICD-10-CM | POA: Diagnosis not present

## 2018-09-05 DIAGNOSIS — E1165 Type 2 diabetes mellitus with hyperglycemia: Secondary | ICD-10-CM | POA: Diagnosis not present

## 2018-09-07 DIAGNOSIS — E890 Postprocedural hypothyroidism: Secondary | ICD-10-CM | POA: Diagnosis not present

## 2018-09-07 DIAGNOSIS — C73 Malignant neoplasm of thyroid gland: Secondary | ICD-10-CM | POA: Diagnosis not present

## 2018-09-07 DIAGNOSIS — I10 Essential (primary) hypertension: Secondary | ICD-10-CM | POA: Diagnosis not present

## 2018-09-07 DIAGNOSIS — E1165 Type 2 diabetes mellitus with hyperglycemia: Secondary | ICD-10-CM | POA: Diagnosis not present

## 2018-09-09 DIAGNOSIS — C73 Malignant neoplasm of thyroid gland: Secondary | ICD-10-CM | POA: Diagnosis not present

## 2018-09-09 DIAGNOSIS — H53453 Other localized visual field defect, bilateral: Secondary | ICD-10-CM | POA: Diagnosis not present

## 2018-09-19 DIAGNOSIS — Z794 Long term (current) use of insulin: Secondary | ICD-10-CM | POA: Diagnosis not present

## 2018-09-19 DIAGNOSIS — E119 Type 2 diabetes mellitus without complications: Secondary | ICD-10-CM | POA: Diagnosis not present

## 2018-09-22 DIAGNOSIS — I6523 Occlusion and stenosis of bilateral carotid arteries: Secondary | ICD-10-CM | POA: Diagnosis not present

## 2018-10-03 ENCOUNTER — Other Ambulatory Visit: Payer: Self-pay | Admitting: Cardiology

## 2018-10-08 ENCOUNTER — Encounter: Payer: Self-pay | Admitting: Cardiology

## 2018-10-08 DIAGNOSIS — E1169 Type 2 diabetes mellitus with other specified complication: Secondary | ICD-10-CM | POA: Insufficient documentation

## 2018-10-08 DIAGNOSIS — I6523 Occlusion and stenosis of bilateral carotid arteries: Secondary | ICD-10-CM

## 2018-10-08 DIAGNOSIS — E7800 Pure hypercholesterolemia, unspecified: Secondary | ICD-10-CM

## 2018-10-08 HISTORY — DX: Occlusion and stenosis of bilateral carotid arteries: I65.23

## 2018-10-08 HISTORY — DX: Pure hypercholesterolemia, unspecified: E78.00

## 2018-10-08 NOTE — Progress Notes (Signed)
Subjective:  Primary Physician:  [name], DO  Patient ID: [name], female    DOB: July 02, 1951, 68 y.o.   MRN: [id number]  Chief Complaint  Patient presents with  . Claudication    6 month F/U carotid stenosis    HPI: [name]  is a 68 y.o. female  with  asymptomatic carotid stenosis, hypertension, hyperlipidemia, exertional chest pain, and dyspnea on exertion. Chest pian symptoms are stable and occasional. Has occasional palpitations.  She also has uncontrolled type 2 diabetes, thyroid nodules, follows Dr. Chalmers Cater, myasthenia gravis, and GERD. She is now on insulin pump over the past 6 months. She underwent echocardiogram on 05/22/2017 revealing moderate LVH and normal LVEF. Lexiscan Myoview stress test on 06/25/2017 with nonischemic.  Patient presents for 6 month follow up for carotid stenosis and hyperlipidemia. She has not tolerated Crestor and Lipitor in the past due to myalgia, thus was started on Simvastatin that she is tolerating. She still has myalgias, but states she is stable. Past Medical History:  Diagnosis Date  . Arthritis    "all over my body" (03/13/2013)  . Asthma   . Asymptomatic carotid artery stenosis, bilateral 10/08/2018  . Chest pain at rest    "woke me up in my sleep" (03/13/2013)  . Corns and callosities   . Difficulty in walking(719.7)   . GERD (gastroesophageal reflux disease)   . H/O hiatal hernia   . Heart murmur   . High cholesterol   . History of stress test 07/11/2010   compared to previous study there is no significant change, Normal Myocardial Perfusion study, this is a low risk scan  . Hx of echocardiogram 07/08/2010   EF> 55% normal 2D Echo  . Hypercholesterolemia 10/08/2018  . Hypertension   . Hypothyroidism   . Myasthenia gravis ([place])   . Myasthenia gravis ([place])    "in my eyes; diagnsosed > 7 yr ago" (03/13/2013)  . Palpitations   . Shortness of breath    "can happen at anytime" (03/13/2013)  . Sleep apnea    "use to  wear mask; cause of insurance purposes I'm not not" (03/13/2013)  . Thyroid carcinoma ([place])   . Type II diabetes mellitus ([place])     Past Surgical History:  Procedure Laterality Date  . ABDOMINAL HYSTERECTOMY    . ANTERIOR CERVICAL DECOMP/DISCECTOMY FUSION     "I've had severa ORs; always went in from the front" (03/13/2013)  . APPENDECTOMY    . CARDIAC CATHETERIZATION     "several" (03/13/2013)  . CARPAL TUNNEL RELEASE Right   . CATARACT EXTRACTION W/ INTRAOCULAR LENS  IMPLANT, BILATERAL Bilateral   . CHOLECYSTECTOMY    . KNEE ARTHROSCOPY Left   . SHOULDER ARTHROSCOPY W/ ROTATOR CUFF REPAIR Left   . TONSILLECTOMY    . TOTAL THYROIDECTOMY      Social History   Socioeconomic History  . Marital status: Single    Spouse name: Not on file  . Number of children: 0  . Years of education: college  . Highest education level: Not on file  Occupational History  . Occupation: retired  Scientific laboratory technician  . Financial resource strain: Not on file  . Food insecurity:    Worry: Not on file    Inability: Not on file  . Transportation needs:    Medical: Not on file    Non-medical: Not on file  Tobacco Use  . Smoking status: Never Smoker  . Smokeless tobacco: Never Used  Substance and Sexual  Activity  . Alcohol use: No    Alcohol/week: 0.0 standard drinks  . Drug use: No  . Sexual activity: Never  Lifestyle  . Physical activity:    Days per week: Not on file    Minutes per session: Not on file  . Stress: Not on file  Relationships  . Social connections:    Talks on phone: Not on file    Gets together: Not on file    Attends religious service: Not on file    Active member of club or organization: Not on file    Attends meetings of clubs or organizations: Not on file    Relationship status: Not on file  . Intimate partner violence:    Fear of current or ex partner: Not on file    Emotionally abused: Not on file    Physically abused: Not on file    Forced sexual activity: Not on  file  Other Topics Concern  . Not on file  Social History Narrative   Caffeine none.    Current Outpatient Medications on File Prior to Visit  Medication Sig Dispense Refill  . B-D ULTRAFINE III SHORT PEN 31G X 8 MM MISC     . Beclomethasone Dipropionate (QNASL) 80 MCG/ACT AERS Place 1 spray into the nose daily. As needed    . BUTRANS 10 MCG/HR PTWK patch as needed.     Marland Kitchen BYDUREON 2 MG SUSR Inject 2 mg as directed once a week.     . canagliflozin (INVOKANA) 100 MG TABS tablet Take 100 mg by mouth daily.    . cyclobenzaprine (FLEXERIL) 10 MG tablet 3 (three) times daily as needed.     . cycloSPORINE (RESTASIS) 0.05 % ophthalmic emulsion Place 1 drop into both eyes 2 (two) times daily.    . diclofenac sodium (VOLTAREN) 1 % GEL Apply 2 g topically 2 (two) times daily.    . diphenoxylate-atropine (LOMOTIL) 2.5-0.025 MG per tablet Take 2 tablets by mouth 4 (four) times daily as needed for diarrhea or loose stools.    Marland Kitchen esomeprazole (NEXIUM) 40 MG capsule Take 40 mg by mouth 2 (two) times daily.    . folic acid (FOLVITE) 1 MG tablet Take 1 mg by mouth daily.    Marland Kitchen gabapentin (NEURONTIN) 100 MG capsule Take 1 capsule by mouth daily.    Marland Kitchen HYDROcodone-acetaminophen (NORCO/VICODIN) 5-325 MG tablet Take 1 tablet by mouth as needed.    Marland Kitchen ibuprofen (ADVIL,MOTRIN) 800 MG tablet Take 2 tablets by mouth as needed.    . insulin lispro (HUMALOG) 100 UNIT/ML KiwkPen Inject into the skin.    Marland Kitchen l-methylfolate-B6-B12 (METANX) 3-35-2 MG TABS Take 1 tablet by mouth 3 (three) times daily.    Marland Kitchen levalbuterol (XOPENEX) 0.63 MG/3ML nebulizer solution every 6 (six) hours as needed.     Marland Kitchen levothyroxine (SYNTHROID, LEVOTHROID) 150 MCG tablet Take 150 mcg by mouth daily before breakfast.    . meclizine (ANTIVERT) 25 MG tablet Take 25 mg by mouth as needed.     . metoprolol (LOPRESSOR) 100 MG tablet Take 1 tablet (100 mg total) by mouth 2 (two) times daily. (Patient taking differently: Take 75 mg by mouth 2 (two) times  daily. ) 180 tablet 3  . mometasone (ELOCON) 0.1 % cream     . mometasone (NASONEX) 50 MCG/ACT nasal spray Place 2 sprays into the nose daily.    . montelukast (SINGULAIR) 10 MG tablet Take 10 mg by mouth at bedtime.    . Multiple Vitamins-Minerals (  MULTIVITAMIN ADULT PO) Take 1 tablet by mouth daily.    . mycophenolate (CELLCEPT) 500 MG tablet Take 500 mg by mouth See admin instructions. Patient states she takes 3 tabs in the AM and 2 tab in the PM    . NON FORMULARY IVIG treatments every 6 weeks at [facility]    . olmesartan (BENICAR) 20 MG tablet Take 20 mg by mouth daily.    . ONE TOUCH ULTRA TEST test strip     . ONETOUCH DELICA LANCETS 40J MISC     . potassium chloride SA (K-DUR,KLOR-CON) 20 MEQ tablet Take 20 mEq by mouth 2 (two) times daily.     . predniSONE (DELTASONE) 10 MG tablet Take 10 mg by mouth 2 (two) times daily.    Marland Kitchen PROAIR HFA 108 (90 BASE) MCG/ACT inhaler Inhale 2 puffs into the lungs every 6 (six) hours as needed.     . promethazine (PHENERGAN) 25 MG tablet Take 25 mg by mouth every 6 (six) hours as needed for nausea or vomiting.    . SYMBICORT 160-4.5 MCG/ACT inhaler USE 2 INHALATIONS TWICE A DAY TO PREVENT COUGH OR WHEEZE. RINSE, GARGLE AND SPIT AFTER USE 30.6 g 1  . traMADol (ULTRAM) 50 MG tablet Take 50 mg by mouth as needed.     . TRANSDERM-SCOP, 1.5 MG, 1 MG/3DAYS     . Vitamin D, Ergocalciferol, (DRISDOL) 50000 UNITS CAPS capsule Take 50,000 Units by mouth 2 (two) times a week.     . zolpidem (AMBIEN) 5 MG tablet Take 1 tablet by mouth at bedtime as needed.    . Insulin NPH Human, Isophane, (HUMULIN N KWIKPEN [place]) Inject into the skin. SLIDING SCALE    . Insulin NPH, Human,, Isophane, (HUMULIN N) 100 UNIT/ML Kiwkpen Inject into the skin.    . metFORMIN (GLUCOPHAGE-XR) 500 MG 24 hr tablet 500 mg 2 (two) times daily.     . simvastatin (ZOCOR) 40 MG tablet Take 1 tablet by mouth daily.     No current facility-administered medications on file prior to visit.     Review of  Systems  Constitutional: Negative for malaise/fatigue and weight loss.  Respiratory: Positive for shortness of breath. Negative for cough and hemoptysis.   Cardiovascular: Positive for chest pain, palpitations, claudication (occasional leg cramps) and leg swelling (mild).  Gastrointestinal: Negative for abdominal pain, blood in stool, constipation, heartburn and vomiting.  Genitourinary: Negative for dysuria.  Musculoskeletal: Negative for joint pain and myalgias.  Neurological: Negative for dizziness, focal weakness and headaches.  Endo/Heme/Allergies: Does not bruise/bleed easily.  Psychiatric/Behavioral: Negative for depression. The patient is not nervous/anxious.   All other systems reviewed and are negative.     Objective:  Blood pressure (!) 166/84, pulse 90, height 5\' 3"  (1.6 m), weight 194 lb (88 kg), SpO2 96 %. Body mass index is 34.37 kg/m.   Physical Exam  Constitutional: She appears well-developed. No distress.  Mildly obese  HENT:  Head: Atraumatic.  Eyes: Conjunctivae are normal.  Neck: Neck supple. No JVD present. No thyromegaly present.  Cardiovascular: Normal rate, regular rhythm and normal heart sounds. Exam reveals no gallop.  No murmur heard. Pulses:      Carotid pulses are 2+ on the right side and on the left side with bruit.      Femoral pulses are 2+ on the right side.      Popliteal pulses are 0 on the right side and 0 on the left side.       Dorsalis pedis  pulses are 1+ on the right side and 2+ on the left side.       Posterior tibial pulses are 0 on the right side and 0 on the left side.  popliteal pulse difficult to feel due to patient's bodily habitus.   Pulmonary/Chest: Effort normal and breath sounds normal.  Abdominal: Soft. Bowel sounds are normal.  Musculoskeletal: Normal range of motion.        General: Edema (trace) present.  Neurological: She is alert.  Skin: Skin is warm and dry.  Psychiatric: She has a normal mood and affect.   CARDIAC  STUDIES:  Carotid artery duplex 09/22/2018: Stenosis in the right internal carotid artery (50-69%), upper end of spectrum. Stenosis in the left internal carotid artery (50-69%), lower end of spectrum. Antegrade right vertebral artery flow. Antegrade left vertebral artery flow. Compared to 03/23/2018, no significant change. Follow up in six months is appropriate if clinically indicated.  Echocardiogram 07/22/2017: Left ventricle cavity is normal in size. Moderate concentric hypertrophy of the left ventricle. Normal global wall motion. Normal diastolic filling pattern. Calculated EF 55%. Left atrial cavity is normal in size. Possible incidental patent foramen ovale is present by color flow Doppler. Trileaflet aortic valve with trace regurgitation. Trace tricuspid regurgitation. Unable to estimate PA pressure due to absence/minimal TR signal. Insignificant pericardial effusion.  Lexiscan myoview stress test 06/25/2017: 1. Resting EKG NSR. Stress EKG: Non diagnostic for ischemia due to pharmacologic stress testing. No ST-T changes of ischemia. 8/10 chest pain with Lexiscan infusion. 2. Myocardial perfusion study was normal, without evidence of ischemia, ejection fraction 70%. Low risk study.   Assessment & Recommendations:   1. Asymptomatic carotid artery stenosis, bilateral No significant change and carotid stenosis, we will continue surveillance in 6 months.  2. Hypercholesterolemia I do not have a recent labs, we will obtain lipid profile testing. 3. Essential hypertension EKG 10/10/2018: Normal sinus rhythm at the rate of 92 bpm, left atrial enlargement, normal axis.  Nonspecific T abnormality.  No evidence of ischemia. 3.  Diabetes mellitus with peripheral neuropathy, retinopathy. 4.  Obstructive sleep apnea on CPAP and compliant. 5. Laboratory exam:   Recommendation: I have discontinued potassium pills and started her on spironolactone 25 mg in the morning due to uncontrolled  hypertension.  I'll obtain CMP along with lipid profile testing in about 10 days.  We discussed regarding weight gain, decreasing her oral intake of food to reduce calories and also every day exercise.  Adrian Prows, MD, [facility] 10/10/2018, 9:35 AM [facility]. [place] Pager: [phone] Office: [phone] If no answer Cell [phone]

## 2018-10-10 ENCOUNTER — Encounter: Payer: Self-pay | Admitting: Cardiology

## 2018-10-10 ENCOUNTER — Ambulatory Visit: Payer: Medicare Other | Admitting: Cardiology

## 2018-10-10 VITALS — BP 166/84 | HR 90 | Ht 63.0 in | Wt 194.0 lb

## 2018-10-10 DIAGNOSIS — IMO0002 Reserved for concepts with insufficient information to code with codable children: Secondary | ICD-10-CM

## 2018-10-10 DIAGNOSIS — I6523 Occlusion and stenosis of bilateral carotid arteries: Secondary | ICD-10-CM

## 2018-10-10 DIAGNOSIS — G4733 Obstructive sleep apnea (adult) (pediatric): Secondary | ICD-10-CM

## 2018-10-10 DIAGNOSIS — E1139 Type 2 diabetes mellitus with other diabetic ophthalmic complication: Secondary | ICD-10-CM

## 2018-10-10 DIAGNOSIS — E7800 Pure hypercholesterolemia, unspecified: Secondary | ICD-10-CM | POA: Diagnosis not present

## 2018-10-10 DIAGNOSIS — E1165 Type 2 diabetes mellitus with hyperglycemia: Secondary | ICD-10-CM

## 2018-10-10 DIAGNOSIS — Z9989 Dependence on other enabling machines and devices: Secondary | ICD-10-CM

## 2018-10-10 DIAGNOSIS — I10 Essential (primary) hypertension: Secondary | ICD-10-CM | POA: Diagnosis not present

## 2018-10-10 MED ORDER — SPIRONOLACTONE 25 MG PO TABS: 25.0000 mg | ORAL_TABLET | Freq: Every day | ORAL | 2 refills | Status: DC

## 2018-10-20 ENCOUNTER — Other Ambulatory Visit: Payer: Self-pay | Admitting: Cardiology

## 2018-10-20 DIAGNOSIS — Z794 Long term (current) use of insulin: Secondary | ICD-10-CM | POA: Diagnosis not present

## 2018-10-20 DIAGNOSIS — E1139 Type 2 diabetes mellitus with other diabetic ophthalmic complication: Secondary | ICD-10-CM | POA: Diagnosis not present

## 2018-10-20 DIAGNOSIS — E1165 Type 2 diabetes mellitus with hyperglycemia: Secondary | ICD-10-CM | POA: Diagnosis not present

## 2018-10-20 DIAGNOSIS — E7800 Pure hypercholesterolemia, unspecified: Secondary | ICD-10-CM | POA: Diagnosis not present

## 2018-10-20 DIAGNOSIS — E119 Type 2 diabetes mellitus without complications: Secondary | ICD-10-CM | POA: Diagnosis not present

## 2018-10-21 LAB — LIPID PANEL WITH LDL/HDL RATIO
Cholesterol, Total: 212 mg/dL — ABNORMAL HIGH (ref 100–199)
HDL: 81 mg/dL (ref >39)
LDL Calculated: 107 mg/dL — ABNORMAL HIGH (ref 0–99)
LDl/HDL Ratio: 1.3 ratio (ref 0.0–3.2)
Triglycerides: 120 mg/dL (ref 0–149)
VLDL Cholesterol Cal: 24 mg/dL (ref 5–40)

## 2018-10-21 LAB — CMP14+EGFR
ALT: 12 IU/L (ref 0–32)
AST: 14 IU/L (ref 0–40)
Albumin/Globulin Ratio: 1.5 (ref 1.2–2.2)
Albumin: 4 g/dL (ref 3.8–4.8)
Alkaline Phosphatase: 99 IU/L (ref 39–117)
BUN/Creatinine Ratio: 22 (ref 12–28)
BUN: 19 mg/dL (ref 8–27)
Bilirubin Total: 0.2 mg/dL (ref 0.0–1.2)
CHLORIDE: 104 mmol/L (ref 96–106)
CO2: 24 mmol/L (ref 20–29)
Calcium: 9.7 mg/dL (ref 8.7–10.3)
Creatinine, Ser: 0.85 mg/dL (ref 0.57–1.00)
GFR calc Af Amer: 83 mL/min/{1.73_m2} (ref >59)
GFR calc non Af Amer: 72 mL/min/{1.73_m2} (ref >59)
Globulin, Total: 2.6 g/dL (ref 1.5–4.5)
Glucose: 99 mg/dL (ref 65–99)
Potassium: 4.2 mmol/L (ref 3.5–5.2)
Sodium: 143 mmol/L (ref 134–144)
Total Protein: 6.6 g/dL (ref 6.0–8.5)

## 2018-10-27 DIAGNOSIS — E113413 Type 2 diabetes mellitus with severe nonproliferative diabetic retinopathy with macular edema, bilateral: Secondary | ICD-10-CM | POA: Diagnosis not present

## 2018-10-27 DIAGNOSIS — Z961 Presence of intraocular lens: Secondary | ICD-10-CM | POA: Diagnosis not present

## 2018-10-27 DIAGNOSIS — H469 Unspecified optic neuritis: Secondary | ICD-10-CM | POA: Diagnosis not present

## 2018-10-27 DIAGNOSIS — Z794 Long term (current) use of insulin: Secondary | ICD-10-CM | POA: Diagnosis not present

## 2018-10-27 DIAGNOSIS — G7000 Myasthenia gravis without (acute) exacerbation: Secondary | ICD-10-CM | POA: Diagnosis not present

## 2018-10-28 DIAGNOSIS — H469 Unspecified optic neuritis: Secondary | ICD-10-CM | POA: Diagnosis not present

## 2018-10-28 DIAGNOSIS — H543 Unqualified visual loss, both eyes: Secondary | ICD-10-CM | POA: Diagnosis not present

## 2018-10-28 DIAGNOSIS — Z85850 Personal history of malignant neoplasm of thyroid: Secondary | ICD-10-CM | POA: Diagnosis not present

## 2018-10-28 DIAGNOSIS — H3500 Unspecified background retinopathy: Secondary | ICD-10-CM | POA: Diagnosis not present

## 2018-11-10 ENCOUNTER — Ambulatory Visit: Payer: Medicare Other | Admitting: Cardiology

## 2018-11-14 ENCOUNTER — Telehealth: Payer: Self-pay

## 2018-11-14 NOTE — Telephone Encounter (Signed)
Patient looking for lab results  

## 2018-11-14 NOTE — Telephone Encounter (Signed)
She has active my chart. Normal labs

## 2018-11-15 ENCOUNTER — Telehealth: Payer: Self-pay

## 2018-11-15 NOTE — Telephone Encounter (Signed)
Pt wants to know should she keep taking the spironalactone, she is unable to get a ride up here for her f/u appt. , she is almost out of the medicine. ?

## 2018-11-15 NOTE — Telephone Encounter (Signed)
Send Rx for 90

## 2018-11-18 DIAGNOSIS — E119 Type 2 diabetes mellitus without complications: Secondary | ICD-10-CM | POA: Diagnosis not present

## 2018-11-18 DIAGNOSIS — Z794 Long term (current) use of insulin: Secondary | ICD-10-CM | POA: Diagnosis not present

## 2018-11-21 ENCOUNTER — Other Ambulatory Visit: Payer: Self-pay

## 2018-11-21 DIAGNOSIS — I10 Essential (primary) hypertension: Secondary | ICD-10-CM

## 2018-11-21 MED ORDER — SPIRONOLACTONE 25 MG PO TABS: 25.0000 mg | ORAL_TABLET | Freq: Every day | ORAL | 0 refills | Status: DC

## 2018-11-28 DIAGNOSIS — Z85850 Personal history of malignant neoplasm of thyroid: Secondary | ICD-10-CM | POA: Diagnosis not present

## 2018-11-28 DIAGNOSIS — H469 Unspecified optic neuritis: Secondary | ICD-10-CM | POA: Diagnosis not present

## 2018-11-28 DIAGNOSIS — G7000 Myasthenia gravis without (acute) exacerbation: Secondary | ICD-10-CM | POA: Diagnosis not present

## 2018-11-28 DIAGNOSIS — H3500 Unspecified background retinopathy: Secondary | ICD-10-CM | POA: Diagnosis not present

## 2018-12-14 DIAGNOSIS — J45909 Unspecified asthma, uncomplicated: Secondary | ICD-10-CM | POA: Diagnosis not present

## 2018-12-14 DIAGNOSIS — G4733 Obstructive sleep apnea (adult) (pediatric): Secondary | ICD-10-CM | POA: Diagnosis not present

## 2018-12-19 ENCOUNTER — Other Ambulatory Visit: Payer: Self-pay

## 2018-12-19 DIAGNOSIS — E119 Type 2 diabetes mellitus without complications: Secondary | ICD-10-CM | POA: Diagnosis not present

## 2018-12-19 DIAGNOSIS — Z794 Long term (current) use of insulin: Secondary | ICD-10-CM | POA: Diagnosis not present

## 2018-12-19 NOTE — Patient Outreach (Signed)
[facility] [place] [facility]) Care Management  12/19/2018  [name] [birth year] [id number]   Medication Adherence call to [name]  HIPPA Compliant Voice message left with a call back number. [name] is showing past due on Simvastatin 40 mg and Olmesartan 20 mg under [place].   [place] Management Direct Dial [phone]  Fax [phone] [email or web address]

## 2019-01-02 ENCOUNTER — Other Ambulatory Visit: Payer: Self-pay

## 2019-01-02 NOTE — Patient Outreach (Signed)
[place] [facility]) Care Management  01/02/2019  [name] [birth year] [id number]   Medication Adherence call to [place] Voice message left with a call back number. [name] is showing past due on Simvastatin 40 mg and Olmesartan 20 mg under [place],   [place] Management Direct Dial [phone]  Fax [phone] [name]

## 2019-01-11 DIAGNOSIS — M25531 Pain in right wrist: Secondary | ICD-10-CM | POA: Diagnosis not present

## 2019-01-11 DIAGNOSIS — Z7189 Other specified counseling: Secondary | ICD-10-CM | POA: Diagnosis not present

## 2019-01-18 DIAGNOSIS — E119 Type 2 diabetes mellitus without complications: Secondary | ICD-10-CM | POA: Diagnosis not present

## 2019-01-18 DIAGNOSIS — Z794 Long term (current) use of insulin: Secondary | ICD-10-CM | POA: Diagnosis not present

## 2019-01-23 ENCOUNTER — Other Ambulatory Visit: Payer: Self-pay

## 2019-01-23 NOTE — Patient Outreach (Signed)
[place] [facility]) Care Management  01/23/2019  CORINE SOLORIO 20-Oct-1950 [id number]   Medication Adherence call to Mrs. Addylin Manke HIPPA Compliant Voice message left with a call back number. Mrs Willig is past due on Simvastatin 40 mg and Olmesartan 20 mg under [place].   [place] Management Direct Dial [phone]  Fax [phone] [email or web address]

## 2019-01-27 DIAGNOSIS — M654 Radial styloid tenosynovitis [de Quervain]: Secondary | ICD-10-CM | POA: Diagnosis not present

## 2019-02-01 DIAGNOSIS — H469 Unspecified optic neuritis: Secondary | ICD-10-CM | POA: Diagnosis not present

## 2019-02-01 DIAGNOSIS — H3500 Unspecified background retinopathy: Secondary | ICD-10-CM | POA: Diagnosis not present

## 2019-02-01 DIAGNOSIS — D499 Neoplasm of unspecified behavior of unspecified site: Secondary | ICD-10-CM | POA: Diagnosis not present

## 2019-02-01 DIAGNOSIS — G130 Paraneoplastic neuromyopathy and neuropathy: Secondary | ICD-10-CM | POA: Diagnosis not present

## 2019-02-01 DIAGNOSIS — Z85850 Personal history of malignant neoplasm of thyroid: Secondary | ICD-10-CM | POA: Diagnosis not present

## 2019-02-03 DIAGNOSIS — Z79899 Other long term (current) drug therapy: Secondary | ICD-10-CM | POA: Diagnosis not present

## 2019-02-03 DIAGNOSIS — H3500 Unspecified background retinopathy: Secondary | ICD-10-CM | POA: Diagnosis not present

## 2019-02-07 DIAGNOSIS — Z01818 Encounter for other preprocedural examination: Secondary | ICD-10-CM | POA: Diagnosis not present

## 2019-02-07 DIAGNOSIS — E1165 Type 2 diabetes mellitus with hyperglycemia: Secondary | ICD-10-CM | POA: Diagnosis not present

## 2019-02-07 DIAGNOSIS — E890 Postprocedural hypothyroidism: Secondary | ICD-10-CM | POA: Diagnosis not present

## 2019-02-07 DIAGNOSIS — E559 Vitamin D deficiency, unspecified: Secondary | ICD-10-CM | POA: Diagnosis not present

## 2019-02-07 DIAGNOSIS — I10 Essential (primary) hypertension: Secondary | ICD-10-CM | POA: Diagnosis not present

## 2019-02-15 ENCOUNTER — Other Ambulatory Visit: Payer: Self-pay

## 2019-02-15 DIAGNOSIS — I10 Essential (primary) hypertension: Secondary | ICD-10-CM

## 2019-02-15 MED ORDER — SPIRONOLACTONE 25 MG PO TABS: 25.0000 mg | ORAL_TABLET | Freq: Every day | ORAL | 1 refills | Status: DC

## 2019-02-17 DIAGNOSIS — D8989 Other specified disorders involving the immune mechanism, not elsewhere classified: Secondary | ICD-10-CM | POA: Diagnosis not present

## 2019-02-17 DIAGNOSIS — Z452 Encounter for adjustment and management of vascular access device: Secondary | ICD-10-CM | POA: Diagnosis not present

## 2019-02-17 DIAGNOSIS — C73 Malignant neoplasm of thyroid gland: Secondary | ICD-10-CM | POA: Diagnosis not present

## 2019-02-17 DIAGNOSIS — Z789 Other specified health status: Secondary | ICD-10-CM | POA: Diagnosis not present

## 2019-02-20 DIAGNOSIS — Z794 Long term (current) use of insulin: Secondary | ICD-10-CM | POA: Diagnosis not present

## 2019-02-20 DIAGNOSIS — E119 Type 2 diabetes mellitus without complications: Secondary | ICD-10-CM | POA: Diagnosis not present

## 2019-03-01 DIAGNOSIS — E1165 Type 2 diabetes mellitus with hyperglycemia: Secondary | ICD-10-CM | POA: Diagnosis not present

## 2019-03-01 DIAGNOSIS — E559 Vitamin D deficiency, unspecified: Secondary | ICD-10-CM | POA: Diagnosis not present

## 2019-03-01 DIAGNOSIS — C73 Malignant neoplasm of thyroid gland: Secondary | ICD-10-CM | POA: Diagnosis not present

## 2019-03-01 DIAGNOSIS — I10 Essential (primary) hypertension: Secondary | ICD-10-CM | POA: Diagnosis not present

## 2019-03-10 DIAGNOSIS — I10 Essential (primary) hypertension: Secondary | ICD-10-CM | POA: Diagnosis not present

## 2019-03-10 DIAGNOSIS — E1165 Type 2 diabetes mellitus with hyperglycemia: Secondary | ICD-10-CM | POA: Diagnosis not present

## 2019-03-10 DIAGNOSIS — E890 Postprocedural hypothyroidism: Secondary | ICD-10-CM | POA: Diagnosis not present

## 2019-03-10 DIAGNOSIS — C73 Malignant neoplasm of thyroid gland: Secondary | ICD-10-CM | POA: Diagnosis not present

## 2019-03-22 DIAGNOSIS — Z794 Long term (current) use of insulin: Secondary | ICD-10-CM | POA: Diagnosis not present

## 2019-03-22 DIAGNOSIS — E119 Type 2 diabetes mellitus without complications: Secondary | ICD-10-CM | POA: Diagnosis not present

## 2019-04-03 DIAGNOSIS — D8989 Other specified disorders involving the immune mechanism, not elsewhere classified: Secondary | ICD-10-CM | POA: Diagnosis not present

## 2019-04-03 DIAGNOSIS — G7000 Myasthenia gravis without (acute) exacerbation: Secondary | ICD-10-CM | POA: Diagnosis not present

## 2019-04-10 ENCOUNTER — Other Ambulatory Visit: Payer: Self-pay

## 2019-04-10 ENCOUNTER — Ambulatory Visit (INDEPENDENT_AMBULATORY_CARE_PROVIDER_SITE_OTHER): Payer: Medicare Other

## 2019-04-10 DIAGNOSIS — I6523 Occlusion and stenosis of bilateral carotid arteries: Secondary | ICD-10-CM | POA: Diagnosis not present

## 2019-04-11 ENCOUNTER — Other Ambulatory Visit: Payer: Self-pay | Admitting: Cardiology

## 2019-04-11 DIAGNOSIS — I6523 Occlusion and stenosis of bilateral carotid arteries: Secondary | ICD-10-CM

## 2019-04-14 ENCOUNTER — Other Ambulatory Visit: Payer: Self-pay

## 2019-04-14 ENCOUNTER — Telehealth: Payer: Self-pay

## 2019-04-14 ENCOUNTER — Encounter: Payer: Self-pay | Admitting: Cardiology

## 2019-04-14 ENCOUNTER — Ambulatory Visit (INDEPENDENT_AMBULATORY_CARE_PROVIDER_SITE_OTHER): Payer: Medicare Other | Admitting: Cardiology

## 2019-04-14 VITALS — BP 180/80 | HR 101 | Ht 63.0 in | Wt 189.1 lb

## 2019-04-14 DIAGNOSIS — D509 Iron deficiency anemia, unspecified: Secondary | ICD-10-CM

## 2019-04-14 DIAGNOSIS — Z8669 Personal history of other diseases of the nervous system and sense organs: Secondary | ICD-10-CM

## 2019-04-14 DIAGNOSIS — I6523 Occlusion and stenosis of bilateral carotid arteries: Secondary | ICD-10-CM

## 2019-04-14 DIAGNOSIS — I10 Essential (primary) hypertension: Secondary | ICD-10-CM | POA: Diagnosis not present

## 2019-04-14 DIAGNOSIS — E7800 Pure hypercholesterolemia, unspecified: Secondary | ICD-10-CM | POA: Diagnosis not present

## 2019-04-14 MED ORDER — SPIRONOLACTONE-HCTZ 25-25 MG PO TABS: 1.0000 | ORAL_TABLET | Freq: Every day | ORAL | 1 refills | Status: DC

## 2019-04-14 MED ORDER — HYDROCHLOROTHIAZIDE 25 MG PO TABS: 25.0000 mg | ORAL_TABLET | Freq: Every day | ORAL | 0 refills | Status: DC

## 2019-04-14 NOTE — Telephone Encounter (Signed)
Simvastatin 

## 2019-04-14 NOTE — Progress Notes (Addendum)
Primary Physician/Referring:  [name] Morning, DO  Patient ID: [name], female    DOB: 30-Oct-1950, 68 y.o.   MRN: [id number]  Chief Complaint  Patient presents with   Follow-up    carotid results   Hypertension   Hyperlipidemia   Asymptomatic carotid artery stenosis   HPI:    [name]  is a 68 y.o. African-American female  with  asymptomatic carotid stenosis, hypertension, hyperlipidemia, exertional dyspnea and chest pain,  uncontrolled type 2 diabetes, thyroid nodules, follows Dr. Chalmers Cater, myasthenia gravis, and GERD.  She underwent echocardiogram on 05/22/2017 revealing moderate LVH and normal LVEF. Lexiscan Myoview stress test on 06/25/2017 with nonischemic.  This is a 60-monthoffice visit for carotid stenosis, hypertension and hyperlipidemia.  She still has fatigue, [facility].  She still has occasional palpitations.  They are brief and short lasting.  She has not had any further chest pains.  She has mild cramps in her legs but states that these are remained stable.  Past Medical History:  Diagnosis Date   Arthritis    "all over my body" (03/13/2013)   Asthma    Asymptomatic carotid artery stenosis, bilateral 10/08/2018   Chest pain at rest    "woke me up in my sleep" (03/13/2013)   Corns and callosities    Difficulty in walking(719.7)    GERD (gastroesophageal reflux disease)    H/O hiatal hernia    Heart murmur    High cholesterol    History of stress test 07/11/2010   compared to previous study there is no significant change, Normal Myocardial Perfusion study, this is a low risk scan   Hx of echocardiogram 07/08/2010   EF> 55% normal 2D Echo   Hypercholesterolemia 10/08/2018   Hypertension    Hypothyroidism    Myasthenia gravis ([facility]    [place]ia [place]is ([facility]    "in my eyes; diagnsosed > 7 yr ago" (03/13/2013)   Palpitations    Shortness of breath     "can happen at anytime" (03/13/2013)   Sleep apnea    "use to wear mask; cause of insurance purposes I'm not not" (03/13/2013)   Thyroid carcinoma ([place]    Type II diabetes mellitus ([place]    Past Surgical History:  Procedure Laterality Date   ABDOMINAL HYSTERECTOMY     ANTERIOR CERVICAL DECOMP/DISCECTOMY FUSION     "I've had severa ORs; always went in from the front" (03/13/2013)   AGregory    "several" (03/13/2013)   CARPAL TUNNEL RELEASE Right    CATARACT EXTRACTION W/ INTRAOCULAR LENS  IMPLANT, BILATERAL Bilateral    CHOLECYSTECTOMY     KNEE ARTHROSCOPY Left    SHOULDER ARTHROSCOPY W/ ROTATOR CUFF REPAIR Left    TONSILLECTOMY     TOTAL THYROIDECTOMY     Social History   Socioeconomic History   Marital status: Single    Spouse name: Not on file   Number of children: 0   Years of education: college   Highest education level: Not on file  Occupational History   Occupation: retired  SScientist, product/process developmentstrain: Not on file   Food insecurity    Worry: Not on file    Inability: Not on fLexicographerneeds    Medical: Not on file    Non-medical: Not on file  Tobacco Use   Smoking status: Never Smoker  Smokeless tobacco: Never Used  Substance and Sexual Activity   Alcohol use: No    Alcohol/week: 0.0 standard drinks   Drug use: No   Sexual activity: Never  Lifestyle   Physical activity    Days per week: Not on file    Minutes per session: Not on file   Stress: Not on file  Relationships   Social connections    Talks on phone: Not on file    Gets together: Not on file    Attends religious service: Not on file    Active member of club or organization: Not on file    Attends meetings of clubs or organizations: Not on file    Relationship status: Not on file   Intimate partner violence    Fear of current or ex partner: Not on file    Emotionally abused: Not on file    Physically  abused: Not on file    Forced sexual activity: Not on file  Other Topics Concern   Not on file  Social History Narrative   Caffeine none.   ROS  Review of Systems  Constitution: Negative for chills, decreased appetite, malaise/fatigue and weight gain.  Cardiovascular: Positive for dyspnea on exertion and palpitations. Negative for leg swelling and syncope.  Endocrine: Negative for cold intolerance.  Hematologic/Lymphatic: Does not bruise/bleed easily.  Musculoskeletal: Positive for muscle cramps (leg cramps). Negative for joint swelling.  Gastrointestinal: Negative for abdominal pain, anorexia, change in bowel habit, hematochezia and melena.  Neurological: Negative for headaches and light-headedness.  Psychiatric/Behavioral: Negative for depression and substance abuse.  All other systems reviewed and are negative.  Objective  Blood pressure (!) 180/80, pulse (!) 101, height _0  (1.6 m), weight 189 lb 1.6 oz (85.8 kg), SpO2 100 %. Body mass index is 33.5 kg/m.   Physical Exam  Constitutional: She appears well-developed. No distress.  Mildly obese  HENT:  Head: Atraumatic.  Eyes: Conjunctivae are normal.  Neck: Neck supple. No JVD present. No thyromegaly present.  Cardiovascular: Normal rate, regular rhythm and normal heart sounds. Exam reveals no gallop.  No murmur heard. Pulses:      Carotid pulses are 2+ on the right side and on the left side with bruit.      Femoral pulses are 2+ on the right side.      Popliteal pulses are 0 on the right side and 0 on the left side.       Dorsalis pedis pulses are 1+ on the right side and 2+ on the left side.       Posterior tibial pulses are 0 on the right side and 0 on the left side.  popliteal pulse difficult to feel due to patient's bodily habitus.   Pulmonary/Chest: Effort normal and breath sounds normal.  Abdominal: Soft. Bowel sounds are normal.  Musculoskeletal: Normal range of motion.        General: Edema (trace) present.    Neurological: She is alert.  Skin: Skin is warm and dry.  Psychiatric: She has a normal mood and affect.   Radiology: No results found.  Laboratory examination:   Comprehensive Metabolic Panel (CMP)Resulted: 04/03/2019 9:39 AM [place] Component Name Value Ref Range  Sodium 138 135 - 145 mmol/L  Potassium 4.0 3.5 - 5 mmol/L  Chloride 108 98 - 108 mmol/L  Carbon Dioxide (CO2) 20 (L) 21 - 30 mmol/L  Urea Nitrogen (BUN) 15 7 - 20 mg/dL  Creatinine 0.8 0.4 - 1 mg/dL  Glucose  217 (H)  Comment: Interpretive Data: Above is the NONFASTING reference range.   Below are the FASTING reference ranges:  NORMAL:      70-99 mg/dL  PREDIABETES: 100-125 mg/dL  DIABETES:    > 125 mg/dL  70 - 140 mg/dL  Calcium 9.0 8.7 - 10.2 mg/dL  AST (Aspartate Aminotransferase) 19 15 - 41 U/L  ALT (Alanine Aminotransferase) 18 14 - 54 U/L  Bilirubin, Total 0.5 0.4 - 1.5 mg/dL  Alk Phos (Alkaline Phosphatase) 81 24 - 110 U/L  Albumin 3.3 (L) 3.5 - 4.8 g/dL  Protein, Total 6.7 6.2 - 8.1 g/dL  Anion Gap 10 3 - 12 mmol/L  BUN/CREA Ratio 19 6 - 27   Glomerular Filtration Rate (eGFR)  89     Complete Blood Count (CBC) with Differential (04/03/2019 8:21 AM EDT) Complete Blood Count (CBC) with Differential (04/03/2019 8:21 AM EDT)  Component Value Ref Range Performed At Pathologist Signature  WBC (White Blood Cell Count) 10.4 (H) 3.2 - 9.8 x109/L [facility]    Hemoglobin 10.9 (L) 12.0 - 15.5 g/dL [facility]    Hematocrit 34.8 (L) 35.0 - 45.0 % [facility]    Platelets 238 150 - 450 x109/L [facility]    MCV (Mean Corpuscular Volume) 94 80 - 98 fL [facility]    MCH (Mean Corpuscular Hemoglobin) 29.3 26.5 - 34.0 pg [facility]    MCHC (Mean Corpuscular Hemoglobin Concentration) 31.3 (L) 31.4 - 36.0 % [facility]    RBC (Red Blood Cell Count) 3.72 (L) 3.77 - 5.16 x1012/L     Recent Labs    10/20/18 1010  NA 143  K 4.2  CL 104  CO2 24  GLUCOSE 99  BUN 19  CREATININE 0.85  CALCIUM 9.7  GFRNONAA 72  GFRAA 83   CMP Latest Ref Rng & Units 10/20/2018 08/15/2015 02/28/2015  Glucose 65 - 99 mg/dL 99 - -  BUN 8 - 27 mg/dL 19 - -  Creatinine 0.57 - 1.00 mg/dL 0.85 - 0.62  Sodium 134 - 144 mmol/L 143 - -  Potassium 3.5 - 5.2 mmol/L 4.2 - -  Chloride 96 - 106 mmol/L 104 - -  CO2 20 - 29 mmol/L 24 - -  Calcium 8.7 - 10.3 mg/dL 9.7 - -  Total Protein 6.0 - 8.5 g/dL 6.6 8.2(H) -  Total Bilirubin 0.0 - 1.2 mg/dL 0.2 0.3 -  Alkaline Phos 39 - 117 IU/L 99 109 -  AST 0 - 40 IU/L 14 19 -  ALT 0 - 32 IU/L 12 17 -   CBC Latest Ref Rng & Units 03/14/2013 03/13/2013 12/10/2010  WBC 4.0 - 10.5 K/uL 6.2 8.6 8.1  Hemoglobin 12.0 - 15.0 g/dL 10.0(L) 11.8(L) 13.2  Hematocrit 36.0 - 46.0 % 29.2(L) 34.5(L) 39.4  Platelets 150 - 400 K/uL 196 225 257   Lipid Panel     Component Value Date/Time   CHOL 212 (H) 10/20/2018 1010   TRIG 120 10/20/2018 1010   HDL 81 10/20/2018 1010   CHOLHDL 3.6 08/15/2015 0858   VLDL 32 (H) 08/15/2015 0858   LDLCALC 107 (H) 10/20/2018 1010   HEMOGLOBIN A1C No results found for: HGBA1C, MPG TSH No results for input(s): TSH in the last 8760 hours. Medications   Prior to Admission medications   Medication Sig Start Date End Date Taking? Authorizing Provider  B-D  ULTRAFINE III SHORT PEN 31G X 8 MM MISC  05/17/14   [provider]  Beclomethasone Dipropionate (QNASL) 80 MCG/ACT AERS Place 1 spray into the nose daily. As needed    [provider]  BUTRANS 10 MCG/HR [place] patch as needed.  11/20/13   [provider]  BYDUREON 2 MG SUSR Inject 2 mg as directed once a week.  11/21/13   [provider]  canagliflozin (INVOKANA) 100 MG TABS tablet Take 100 mg by mouth daily. 11/13/15   [provider]  cyclobenzaprine  (FLEXERIL) 10 MG tablet 3 (three) times daily as needed.  11/01/13   [provider]  cycloSPORINE (RESTASIS) 0.05 % ophthalmic emulsion Place 1 drop into both eyes 2 (two) times daily.    [provider]  diclofenac sodium (VOLTAREN) 1 % GEL Apply 2 g topically 2 (two) times daily.    [provider]  diphenoxylate-atropine (LOMOTIL) 2.5-0.025 MG per tablet Take 2 tablets by mouth 4 (four) times daily as needed for diarrhea or loose stools.    [provider]  esomeprazole (NEXIUM) 40 MG capsule Take 40 mg by mouth 2 (two) times daily.    [provider]  folic acid (FOLVITE) 1 MG tablet Take 1 mg by mouth daily.    [provider]  gabapentin (NEURONTIN) 100 MG capsule Take 1 capsule by mouth daily. 06/30/18   [provider]  HYDROcodone-acetaminophen (NORCO/VICODIN) 5-325 MG tablet Take 1 tablet by mouth as needed. 04/10/10   [provider]  ibuprofen (ADVIL,MOTRIN) 800 MG tablet Take 2 tablets by mouth as needed. 04/10/10   [provider]  insulin lispro (HUMALOG) 100 UNIT/ML KiwkPen Inject into the skin.    [provider]  l-methylfolate-B6-B12 (METANX) 3-35-2 MG TABS Take 1 tablet by mouth 3 (three) times daily.    [provider]  levalbuterol Penne Lash) 0.63 MG/3ML nebulizer solution every 6 (six) hours as needed.  03/07/14   [provider]  levothyroxine (SYNTHROID, LEVOTHROID) 150 MCG tablet Take 150 mcg by mouth daily before breakfast.    [provider]  meclizine (ANTIVERT) 25 MG tablet Take 25 mg by mouth as needed.  06/27/13   [provider]  metFORMIN (GLUCOPHAGE-XR) 500 MG 24 hr tablet 500 mg 2 (two) times daily.  06/27/13   [provider]  metoprolol (LOPRESSOR) 100 MG tablet Take 1 tablet (100 mg total) by mouth 2 (two) times daily. Patient taking differently: Take 75 mg by mouth 2 (two) times daily.  07/24/14   Isaiah Serge, NP  mometasone  (ELOCON) 0.1 % cream  04/03/13   [provider]  mometasone (NASONEX) 50 MCG/ACT nasal spray Place 2 sprays into the nose daily.    [provider]  montelukast (SINGULAIR) 10 MG tablet Take 10 mg by mouth at bedtime.    [provider]  Multiple Vitamins-Minerals (MULTIVITAMIN ADULT PO) Take 1 tablet by mouth daily.    [provider]  mycophenolate (CELLCEPT) 500 MG tablet Take 500 mg by mouth See admin instructions. Patient states she takes 3 tabs in the AM and 2 tab in the PM    [provider]  NON FORMULARY IVIG treatments every 6 weeks at [facility]    [provider]  olmesartan (BENICAR) 20 MG tablet Take 20 mg by mouth daily.    [provider]  ONE TOUCH ULTRA TEST test strip  11/07/13   [provider]  ONETOUCH DELICA LANCETS 28U MISC  07/23/15   [provider]  predniSONE (DELTASONE) 10 MG tablet Take 10 mg by mouth 2 (two) times daily.    [provider]  PROAIR HFA 108 (90 BASE) MCG/ACT inhaler Inhale 2 puffs into the lungs every 6 (six) hours as needed.  06/02/13   [provider]  promethazine (PHENERGAN) 25 MG tablet Take 25 mg by mouth every 6 (six) hours as needed for nausea or vomiting.    [provider]  simvastatin (ZOCOR) 40 MG tablet Take 1 tablet by mouth daily. 08/30/18   [provider]  spironolactone (ALDACTONE) 25 MG tablet Take 1 tablet (25 mg total) by mouth daily. 02/15/19 05/16/19  Adrian Prows, MD  SYMBICORT 160-4.5 MCG/ACT inhaler USE 2 INHALATIONS TWICE A DAY TO PREVENT COUGH OR WHEEZE. RINSE, GARGLE AND SPIT AFTER USE 07/12/15   Charlies Silvers, MD  traMADol (ULTRAM) 50 MG tablet Take 50 mg by mouth as needed.  12/04/13   [provider]  TRANSDERM-SCOP, 1.5 MG, 1 MG/3DAYS  07/24/15   [provider]  Vitamin D, Ergocalciferol, (DRISDOL) 50000 UNITS CAPS capsule Take 50,000 Units by mouth 2 (two) times a week.  12/12/13   [provider]  zolpidem (AMBIEN) 5 MG tablet Take 1 tablet by mouth at bedtime as needed. 05/15/14   [provider]    Current Outpatient Medications  Medication Instructions   B-D ULTRAFINE III SHORT PEN 31G X 8 MM MISC No dose, route, or frequency recorded.   Beclomethasone Dipropionate (QNASL) 80 MCG/ACT AERS 1 spray, Nasal, Daily, As needed   BUTRANS 10 MCG/HR PTWK patch As needed   Bydureon 2 mg, Weekly   canagliflozin (INVOKANA) 100 mg, Oral, Daily   cyclobenzaprine (FLEXERIL) 10 MG tablet 3 times daily PRN   cycloSPORINE (RESTASIS) 0.05 % ophthalmic emulsion 1 drop, 2 times daily   diclofenac sodium (VOLTAREN) 2 g, 2 times daily   diphenoxylate-atropine (LOMOTIL) 2.5-0.025 MG per tablet 2 tablets, Oral, 4 times daily PRN   esomeprazole (NEXIUM) 40 mg, 2 times daily   folic acid (FOLVITE) 1 mg, Oral, Daily   gabapentin (NEURONTIN) 100 MG capsule 1 capsule, Oral, Daily   hydrochlorothiazide (HYDRODIURIL) 25 mg, Oral, Daily   HYDROcodone-acetaminophen (NORCO/VICODIN) 5-325 MG tablet 1 tablet, Oral, As needed   ibuprofen (ADVIL,MOTRIN) 800 MG tablet 2 tablets, Oral, As needed   insulin lispro (HUMALOG) 100 UNIT/ML KiwkPen Subcutaneous, slidng scale   l-methylfolate-B6-B12 (METANX) 3-35-2 MG TABS 1 tablet, Oral, 3 times daily   levalbuterol (XOPENEX) 0.63 MG/3ML nebulizer solution Every 6 hours PRN   Levothyroxine Sodium 137 mcg, Oral, Daily before breakfast   meclizine (ANTIVERT) 25 mg, As needed   metoprolol tartrate (LOPRESSOR) 100 mg, Oral, 2 times daily   mometasone (ELOCON) 0.1 % cream No dose, route, or frequency recorded.   mometasone (NASONEX) 50 MCG/ACT nasal spray 2 sprays, Nasal, Daily   montelukast (SINGULAIR) 10 mg, Daily at bedtime   Multiple Vitamins-Minerals (MULTIVITAMIN ADULT PO) 1 tablet, Oral, Daily   mycophenolate (CELLCEPT) 500 mg, See admin instructions   NON FORMULARY IVIG treatments every 6 weeks at [facility]   olmesartan  (BENICAR) 20 mg, Daily   ONE TOUCH ULTRA TEST test strip No dose, route, or frequency recorded.   ONETOUCH DELICA LANCETS 85I MISC No dose, route, or frequency recorded.   predniSONE (DELTASONE) 10 mg, 2 times daily   PROAIR HFA 108 (90 BASE) MCG/ACT inhaler 2 puffs, Inhalation, Every 6 hours PRN   promethazine (PHENERGAN) 25 mg,  Oral, Every 6 hours PRN   simvastatin (ZOCOR) 40 MG tablet 1 tablet, Oral, Daily-1800   spironolactone (ALDACTONE) 25 mg, Oral, Daily   spironolactone-hydrochlorothiazide (ALDACTAZIDE) 25-25 MG tablet 1 tablet, Oral, Daily   SYMBICORT 160-4.5 MCG/ACT inhaler USE 2 INHALATIONS TWICE A DAY TO PREVENT COUGH OR WHEEZE. RINSE, GARGLE AND SPIT AFTER USE   traMADol (ULTRAM) 50 mg, As needed   TRANSDERM-SCOP, 1.5 MG, 1 MG/3DAYS No dose, route, or frequency recorded.   Vitamin D (Ergocalciferol) (DRISDOL) 50,000 Units, 2 times weekly   zolpidem (AMBIEN) 5 MG tablet 1 tablet, Oral, At bedtime PRN    Cardiac Studies:   Echocardiogram 07/22/2017: Left ventricle cavity is normal in size. Moderate concentric hypertrophy of the left ventricle. Normal global wall motion. Normal diastolic filling pattern. Calculated EF 55%. Left atrial cavity is normal in size. Possible incidental patent foramen ovale is present by color flow Doppler. Trileaflet aortic valve with trace regurgitation. Trace tricuspid regurgitation. Unable to estimate PA pressure due to absence/minimal TR signal. Insignificant pericardial effusion.  Lexiscan myoview stress test 06/25/2017: 1. Resting EKG NSR. Stress EKG: Non diagnostic for ischemia due to pharmacologic stress testing. No ST-T changes of ischemia. 8/10 chest pain with Lexiscan infusion. 2. Myocardial perfusion study was normal, without evidence of ischemia, ejection fraction 70%. Low risk study.  Carotid artery duplex  04/10/2019: Stenosis in the right internal carotid artery (50-69%), upper end of spectrum. Stenosis in the left internal  carotid artery (50-69%), lower end of spectrum. Antegrade right vertebral artery flow. Antegrade left vertebral artery flow. Compared to 09/22/2018, no significant change. Follow up in six months is appropriate if clinically indicated.   Assessment     ICD-10-CM   1. Asymptomatic carotid artery stenosis, bilateral  I65.23 EKG 12-Lead  2. Essential hypertension  I10 EKG 12-Lead    spironolactone-hydrochlorothiazide (ALDACTAZIDE) 25-25 MG tablet    hydrochlorothiazide (HYDRODIURIL) 25 MG tablet  3. Iron deficiency anemia, unspecified iron deficiency anemia type  D50.9 Vitamin B12    Folate    Iron and TIBC    Ferritin  4. Hypercholesterolemia  E78.00 Lipid Panel With LDL/HDL Ratio    LDL cholesterol, direct  5. History of myasthenia gravis  Z86.69     EKG 04/14/2019: Normal sinus rhythm at rate of 98 bpm, normal axis.  No evidence of ischemia, normal EKG.  Recommendations:  I have reviewed the results of the Carotid artery duplex, advised her that aggressive control of hypertension and hyperlipidemia is indicated, I reviewed the labs from [facility], she does not have a recent lipids.  She continues to be anemic, I will order anemia panel.  With regard to hypertension, blood pressure is elevated and she has noticed her pressures to be 150 mmHg even at home.  I will discontinue HCTZ and also potassium supplements and switch her to Aldactazide 25/25 mg every [name], she will need BMP repeated in 10 days to 2 weeks.  Otherwise she is stable from cardiac standpoint, I will see her back in 6 months.  Adrian Prows, MD, [facility] 04/14/2019, 5:25 PM [facility]. [place] Pager: [phone] Office: [phone] If no answer Cell [phone]

## 2019-04-14 NOTE — Addendum Note (Signed)
Addended by: Kela Millin on: 04/14/2019 05:26 PM   Modules accepted: Orders

## 2019-04-14 NOTE — Telephone Encounter (Signed)
Pt called and wants to know which med did you ask her if she is taking because she forgot ?

## 2019-04-15 LAB — LIPID PANEL WITH LDL/HDL RATIO
Cholesterol, Total: 243 mg/dL — ABNORMAL HIGH (ref 100–199)
HDL: 67 mg/dL (ref >39)
LDL Calculated: 146 mg/dL — ABNORMAL HIGH (ref 0–99)
LDl/HDL Ratio: 2.2 ratio (ref 0.0–3.2)
Triglycerides: 150 mg/dL — ABNORMAL HIGH (ref 0–149)
VLDL Cholesterol Cal: 30 mg/dL (ref 5–40)

## 2019-04-15 LAB — FERRITIN: Ferritin: 84 ng/mL (ref 15–150)

## 2019-04-15 LAB — LDL CHOLESTEROL, DIRECT: LDL Direct: 163 mg/dL — ABNORMAL HIGH (ref 0–99)

## 2019-04-15 LAB — VITAMIN B12: Vitamin B-12: 282 pg/mL (ref 232–1245)

## 2019-04-15 LAB — IRON AND TIBC
Iron Saturation: 20 % (ref 15–55)
Iron: 66 ug/dL (ref 27–139)
Total Iron Binding Capacity: 322 ug/dL (ref 250–450)
UIBC: 256 ug/dL (ref 118–369)

## 2019-04-15 LAB — FOLATE: Folate: 7.9 ng/mL (ref >3.0)

## 2019-04-16 NOTE — Addendum Note (Signed)
Addended by: Kela Millin on: 04/16/2019 08:04 AM   Modules accepted: Orders

## 2019-04-17 DIAGNOSIS — D8989 Other specified disorders involving the immune mechanism, not elsewhere classified: Secondary | ICD-10-CM | POA: Diagnosis not present

## 2019-04-17 DIAGNOSIS — G7000 Myasthenia gravis without (acute) exacerbation: Secondary | ICD-10-CM | POA: Diagnosis not present

## 2019-04-20 ENCOUNTER — Other Ambulatory Visit: Payer: Self-pay

## 2019-04-20 DIAGNOSIS — Z20822 Contact with and (suspected) exposure to covid-19: Secondary | ICD-10-CM

## 2019-04-20 NOTE — Telephone Encounter (Signed)
Unable to reach pt

## 2019-04-21 ENCOUNTER — Other Ambulatory Visit: Payer: Self-pay

## 2019-04-21 LAB — NOVEL CORONAVIRUS, NAA: SARS-CoV-2, NAA: NOT DETECTED

## 2019-04-21 MED ORDER — EZETIMIBE 10 MG PO TABS: 10.0000 mg | ORAL_TABLET | Freq: Every day | ORAL | 0 refills | Status: DC

## 2019-04-21 NOTE — Progress Notes (Signed)
Spoke to patient. She agreed to take zetia 10mg , I sent it to her pharmacy. Also advised her to start b12 1000 mcg daily. Also told her to recheck labs and made her an appt for 2 months.Marland Kitchen

## 2019-04-22 DIAGNOSIS — Z794 Long term (current) use of insulin: Secondary | ICD-10-CM | POA: Diagnosis not present

## 2019-04-22 DIAGNOSIS — E119 Type 2 diabetes mellitus without complications: Secondary | ICD-10-CM | POA: Diagnosis not present

## 2019-05-05 ENCOUNTER — Other Ambulatory Visit: Payer: Self-pay

## 2019-05-05 NOTE — Patient Outreach (Signed)
[facility]) Care Management  05/05/2019  TEEYA WYGLE 10-17-1950 [id number]   Medication Adherence call to Mrs. Oce[place] Chakrabarti spoke with patient patient did not want to engage patient hung up patient is past due on Simvastatin 40 mg and Olmesartan 20 mg under [place].   [place] Management Direct Dial [phone]  Fax [phone] [email or web address]

## 2019-05-12 DIAGNOSIS — E113293 Type 2 diabetes mellitus with mild nonproliferative diabetic retinopathy without macular edema, bilateral: Secondary | ICD-10-CM | POA: Diagnosis not present

## 2019-05-12 DIAGNOSIS — H3500 Unspecified background retinopathy: Secondary | ICD-10-CM | POA: Diagnosis not present

## 2019-05-12 DIAGNOSIS — Z79899 Other long term (current) drug therapy: Secondary | ICD-10-CM | POA: Diagnosis not present

## 2019-05-12 DIAGNOSIS — H469 Unspecified optic neuritis: Secondary | ICD-10-CM | POA: Diagnosis not present

## 2019-05-12 DIAGNOSIS — Z85850 Personal history of malignant neoplasm of thyroid: Secondary | ICD-10-CM | POA: Diagnosis not present

## 2019-05-22 DIAGNOSIS — Z794 Long term (current) use of insulin: Secondary | ICD-10-CM | POA: Diagnosis not present

## 2019-05-22 DIAGNOSIS — E119 Type 2 diabetes mellitus without complications: Secondary | ICD-10-CM | POA: Diagnosis not present

## 2019-05-31 DIAGNOSIS — E7800 Pure hypercholesterolemia, unspecified: Secondary | ICD-10-CM | POA: Diagnosis not present

## 2019-05-31 DIAGNOSIS — I10 Essential (primary) hypertension: Secondary | ICD-10-CM | POA: Diagnosis not present

## 2019-06-01 LAB — CMP14+EGFR
ALT: 17 IU/L (ref 0–32)
AST: 13 IU/L (ref 0–40)
Albumin/Globulin Ratio: 1.6 (ref 1.2–2.2)
Albumin: 4 g/dL (ref 3.8–4.8)
Alkaline Phosphatase: 105 IU/L (ref 39–117)
BUN/Creatinine Ratio: 25 (ref 12–28)
BUN: 24 mg/dL (ref 8–27)
Bilirubin Total: 0.3 mg/dL (ref 0.0–1.2)
CO2: 21 mmol/L (ref 20–29)
Calcium: 9.7 mg/dL (ref 8.7–10.3)
Chloride: 98 mmol/L (ref 96–106)
Creatinine, Ser: 0.95 mg/dL (ref 0.57–1.00)
GFR calc Af Amer: 72 mL/min/{1.73_m2} (ref >59)
GFR calc non Af Amer: 63 mL/min/{1.73_m2} (ref >59)
Globulin, Total: 2.5 g/dL (ref 1.5–4.5)
Glucose: 329 mg/dL — ABNORMAL HIGH (ref 65–99)
Potassium: 4.3 mmol/L (ref 3.5–5.2)
Sodium: 143 mmol/L (ref 134–144)
Total Protein: 6.5 g/dL (ref 6.0–8.5)

## 2019-06-01 LAB — LDL CHOLESTEROL, DIRECT: LDL Direct: 147 mg/dL — ABNORMAL HIGH (ref 0–99)

## 2019-06-01 LAB — TSH: TSH: 0.133 u[IU]/mL — ABNORMAL LOW (ref 0.450–4.500)

## 2019-06-01 NOTE — Progress Notes (Signed)
Lipids are still high, I will discuss on her OV. She still has abnormal TSH.

## 2019-06-08 DIAGNOSIS — E113313 Type 2 diabetes mellitus with moderate nonproliferative diabetic retinopathy with macular edema, bilateral: Secondary | ICD-10-CM | POA: Diagnosis not present

## 2019-06-08 DIAGNOSIS — G7000 Myasthenia gravis without (acute) exacerbation: Secondary | ICD-10-CM | POA: Diagnosis not present

## 2019-06-08 DIAGNOSIS — H469 Unspecified optic neuritis: Secondary | ICD-10-CM | POA: Diagnosis not present

## 2019-06-08 DIAGNOSIS — Z961 Presence of intraocular lens: Secondary | ICD-10-CM | POA: Diagnosis not present

## 2019-06-21 DIAGNOSIS — E119 Type 2 diabetes mellitus without complications: Secondary | ICD-10-CM | POA: Diagnosis not present

## 2019-06-21 DIAGNOSIS — Z794 Long term (current) use of insulin: Secondary | ICD-10-CM | POA: Diagnosis not present

## 2019-06-22 ENCOUNTER — Encounter: Payer: Self-pay | Admitting: Cardiology

## 2019-06-22 ENCOUNTER — Ambulatory Visit (INDEPENDENT_AMBULATORY_CARE_PROVIDER_SITE_OTHER): Payer: Medicare Other | Admitting: Cardiology

## 2019-06-22 ENCOUNTER — Other Ambulatory Visit: Payer: Self-pay

## 2019-06-22 VITALS — BP 140/62 | HR 101 | Temp 96.5°F | Ht 63.0 in | Wt 188.0 lb

## 2019-06-22 DIAGNOSIS — D638 Anemia in other chronic diseases classified elsewhere: Secondary | ICD-10-CM | POA: Diagnosis not present

## 2019-06-22 DIAGNOSIS — I10 Essential (primary) hypertension: Secondary | ICD-10-CM

## 2019-06-22 DIAGNOSIS — E7800 Pure hypercholesterolemia, unspecified: Secondary | ICD-10-CM | POA: Diagnosis not present

## 2019-06-22 MED ORDER — SIMVASTATIN 40 MG PO TABS: 40.0000 mg | ORAL_TABLET | Freq: Every day | ORAL | 1 refills | Status: DC

## 2019-06-22 MED ORDER — EZETIMIBE 10 MG PO TABS: 10.0000 mg | ORAL_TABLET | Freq: Every day | ORAL | 1 refills | Status: DC

## 2019-06-22 NOTE — Progress Notes (Signed)
Primary Physician/Referring:  [name], DO  Patient ID: [name], female    DOB: 1951-05-02, 68 y.o.   MRN: [id number]  Chief Complaint  Patient presents with  . Hyperlipidemia  . Follow-up    29mo . Hypertension   HPI:    [name] is a 68y.o. African-American female  with  asymptomatic carotid stenosis, hypertension, hyperlipidemia, exertional dyspnea and chest pain,  uncontrolled type 2 diabetes, thyroid nodules, follows Dr. BChalmers Cater myasthenia gravis, and GERD.  She underwent echocardiogram on 05/22/2017 revealing moderate LVH and normal LVEF. Lexiscan Myoview stress test on 06/25/2017 with nonischemic.    She still has chronic fatigue, [facility].   I had seen the patient on 04/14/2019, for follow-up of carotid disease and also hypertension hyperlipidemia.  Due to anemia, I had ordered anemia panel and also she had abnormal TSH and elevated lipids hence had also added lipid profile testing.  As LDL was heart recommended starting Zetia 10 mg daily and also recommend reducing OTC B12 in view of low normal B12 levels.  She now presents here for follow-up. States that her blood pressure has been well-controlled.  She is tolerating Zetia. She is also on B12 supplements.   Past Medical History:  Diagnosis Date  . Arthritis    "all over my body" (03/13/2013)  . Asthma   . Asymptomatic carotid artery stenosis, bilateral 10/08/2018  . Chest pain at rest    "woke me up in my sleep" (03/13/2013)  . Corns and callosities   . Difficulty in walking(719.7)   . GERD (gastroesophageal reflux disease)   . H/O hiatal hernia   . Heart murmur   . High cholesterol   . History of stress test 07/11/2010   compared to previous study there is no significant change, Normal Myocardial Perfusion study, this is a low risk scan  . Hx of echocardiogram 07/08/2010   EF> 55% normal 2D Echo  .  Hypercholesterolemia 10/08/2018  . Hypertension   . Hypothyroidism   . Myasthenia gravis ([facility]   . My[place]ia gravis ([place]    "in my eyes; diagnsosed > 7 yr ago" (03/13/2013)  . Palpitations   . Shortness of breath    "can happen at anytime" (03/13/2013)  . Sleep apnea    "use to wear mask; cause of insurance purposes I'm not not" (03/13/2013)  . Thyroid carcinoma ([place]   . Type II diabetes mellitus ([place]    Past Surgical History:  Procedure Laterality Date  . ABDOMINAL HYSTERECTOMY    . ANTERIOR CERVICAL DECOMP/DISCECTOMY FUSION     "I've had severa ORs; always went in from the front" (03/13/2013)  . APPENDECTOMY    . CARDIAC CATHETERIZATION     "several" (03/13/2013)  . CARPAL TUNNEL RELEASE Right   . CATARACT EXTRACTION W/ INTRAOCULAR LENS  IMPLANT, BILATERAL Bilateral   . CHOLECYSTECTOMY    . KNEE ARTHROSCOPY Left   . SHOULDER ARTHROSCOPY W/ ROTATOR CUFF REPAIR Left   . TONSILLECTOMY    . TOTAL THYROIDECTOMY     Social History   Socioeconomic History  . Marital status: Single    Spouse name: Not on file  . Number of children: 0  . Years of education: college  . Highest education level: Not on file  Occupational History  . Occupation: retired  SScientific laboratory technician . Financial resource strain: Not on file  . Food insecurity  Worry: Not on file    Inability: Not on file  . Transportation needs    Medical: Not on file    Non-medical: Not on file  Tobacco Use  . Smoking status: Never Smoker  . Smokeless tobacco: Never Used  Substance and Sexual Activity  . Alcohol use: No    Alcohol/week: 0.0 standard drinks  . Drug use: No  . Sexual activity: Never  Lifestyle  . Physical activity    Days per week: Not on file    Minutes per session: Not on file  . Stress: Not on file  Relationships  . Social Herbalist on phone: Not on file    Gets together: Not on file    Attends religious service: Not on file    Active member of club or organization: Not on file     Attends meetings of clubs or organizations: Not on file    Relationship status: Not on file  . Intimate partner violence    Fear of current or ex partner: Not on file    Emotionally abused: Not on file    Physically abused: Not on file    Forced sexual activity: Not on file  Other Topics Concern  . Not on file  Social History Narrative   Caffeine none.   ROS  Review of Systems  Constitution: Negative for chills, decreased appetite, malaise/fatigue and weight gain.  Cardiovascular: Positive for dyspnea on exertion and palpitations. Negative for leg swelling and syncope.  Endocrine: Negative for cold intolerance.  Hematologic/Lymphatic: Does not bruise/bleed easily.  Musculoskeletal: Positive for muscle cramps (leg cramps). Negative for joint swelling.  Gastrointestinal: Negative for abdominal pain, anorexia, change in bowel habit, hematochezia and melena.  Neurological: Negative for headaches and light-headedness.  Psychiatric/Behavioral: Negative for depression and substance abuse.  All other systems reviewed and are negative.  Objective  Blood pressure 140/62, pulse (!) 101, temperature (!) 96.5 F (35.8 C), height _0  (1.6 m), weight 188 lb (85.3 kg), SpO2 95 %. Body mass index is 33.3 kg/m.   Physical Exam  Constitutional: She appears well-developed. No distress.  Mildly obese  HENT:  Head: Atraumatic.  Eyes: Conjunctivae are normal.  Neck: Neck supple. No JVD present. No thyromegaly present.  Cardiovascular: Normal rate, regular rhythm and normal heart sounds. Exam reveals no gallop.  No murmur heard. Pulses:      Carotid pulses are 2+ on the right side and on the left side with bruit.      Femoral pulses are 2+ on the right side.      Popliteal pulses are 0 on the right side and 0 on the left side.       Dorsalis pedis pulses are 1+ on the right side and 2+ on the left side.       Posterior tibial pulses are 0 on the right side and 0 on the left side.  popliteal  pulse difficult to feel due to patient's bodily habitus.   Pulmonary/Chest: Effort normal and breath sounds normal.  Abdominal: Soft. Bowel sounds are normal.  Musculoskeletal: Normal range of motion.        General: Edema (trace) present.  Neurological: She is alert.  Skin: Skin is warm and dry.  Psychiatric: She has a normal mood and affect.   Radiology: No results found.  Laboratory examination:   Comprehensive Metabolic Panel (CMP)Resulted: 04/03/2019 9:39 AM [place] Component Name Value Ref Range  Sodium 138 135 - 145 mmol/L  Potassium 4.0 3.5 - 5 mmol/L  Chloride 108 98 - 108 mmol/L  Carbon Dioxide (CO2) 20 (L) 21 - 30 mmol/L  Urea Nitrogen (BUN) 15 7 - 20 mg/dL  Creatinine 0.8 0.4 - 1 mg/dL  Glucose 217 (H)  Comment: Interpretive Data: Above is the NONFASTING reference range.   Below are the FASTING reference ranges:  NORMAL:      70-99 mg/dL  PREDIABETES: 100-125 mg/dL  DIABETES:    > 125 mg/dL  70 - 140 mg/dL  Calcium 9.0 8.7 - 10.2 mg/dL  AST (Aspartate Aminotransferase) 19 15 - 41 U/L  ALT (Alanine Aminotransferase) 18 14 - 54 U/L  Bilirubin, Total 0.5 0.4 - 1.5 mg/dL  Alk Phos (Alkaline Phosphatase) 81 24 - 110 U/L  Albumin 3.3 (L) 3.5 - 4.8 g/dL  Protein, Total 6.7 6.2 - 8.1 g/dL  Anion Gap 10 3 - 12 mmol/L  BUN/CREA Ratio 19 6 - 27   Glomerular Filtration Rate (eGFR)  89     Complete Blood Count (CBC) with Differential (04/03/2019 8:21 AM EDT) Complete Blood Count (CBC) with Differential (04/03/2019 8:21 AM EDT)  Component Value Ref Range Performed At Pathologist Signature  WBC (White Blood Cell Count) 10.4 (H) 3.2 - 9.8 x10^9/L [facility]    Hemoglobin 10.9 (L) 12.0 - 15.5 g/dL [facility]    Hematocrit 34.8 (L) 35.0 - 45.0 % [facility]    Platelets 238 150 - 450 x10^9/L [facility]    MCV (Mean Corpuscular Volume) 94 80 - 98  fL [facility]    MCH (Mean Corpuscular Hemoglobin) 29.3 26.5 - 34.0 pg [facility]    MCHC (Mean Corpuscular Hemoglobin Concentration) 31.3 (L) 31.4 - 36.0 % [facility]    RBC (Red Blood Cell Count) 3.72 (L) 3.77 - 5.16 x10^12/L     Recent Labs    10/20/18 1010 05/31/19 1101  NA 143 143  K 4.2 4.3  CL 104 98  CO2 24 21  GLUCOSE 99 329*  BUN 19 24  CREATININE 0.85 0.95  CALCIUM 9.7 9.7  GFRNONAA 72 63  GFRAA 83 72   CMP Latest Ref Rng & Units 05/31/2019 10/20/2018 08/15/2015  Glucose 65 - 99 mg/dL 329(H) 99 -  BUN 8 - 27 mg/dL 24 19 -  Creatinine 0.57 - 1.00 mg/dL 0.95 0.85 -  Sodium 134 - 144 mmol/L 143 143 -  Potassium 3.5 - 5.2 mmol/L 4.3 4.2 -  Chloride 96 - 106 mmol/L 98 104 -  CO2 20 - 29 mmol/L 21 24 -  Calcium 8.7 - 10.3 mg/dL 9.7 9.7 -  Total Protein 6.0 - 8.5 g/dL 6.5 6.6 8.2(H)  Total Bilirubin 0.0 - 1.2 mg/dL 0.3 0.2 0.3  Alkaline Phos 39 - 117 IU/L 105 99 109  AST 0 - 40 IU/L _0 ALT 0 - 32 IU/L _1 CBC Latest Ref Rng & Units 03/14/2013 03/13/2013 12/10/2010  WBC 4.0 - 10.5 K/uL 6.2 8.6 8.1  Hemoglobin 12.0 - 15.0 g/dL 10.0(L) 11.8(L) 13.2  Hematocrit 36.0 - 46.0 % 29.2(L) 34.5(L) 39.4  Platelets 150 - 400 K/uL 196 225 257   Lipid Panel     Component Value Date/Time   CHOL 243 (H) 04/14/2019 1018   TRIG 150 (H) 04/14/2019 1018   HDL 67 04/14/2019 1018   CHOLHDL 3.6 08/15/2015 0858  VLDL 32 (H) 08/15/2015 0858   LDLCALC 146 (H) 04/14/2019 1018   LDLDIRECT 147 (H) 05/31/2019 1101   HEMOGLOBIN A1C No results found for: HGBA1C, MPG TSH Recent Labs    05/31/19 1101  TSH 0.133*    Ref Range & Units 06/14/2019  Ferritin 15 - 150 ng/mL 84  Final result Visible to patient:  Yes (MyChart) Next appt:  10/20/2019 at 08:30 AM in Cardiology Adrian Prows, MD) Dx:  Iron deficiency anemia, unspecified i...  Ref Range & Units 06/14/2019  Total Iron Binding Capacity  250 - 450 ug/dL 322   UIBC 118 - 369 ug/dL 256   Iron 27 - 139 ug/dL 66   Iron Saturation 15 - 55 % 20        Vitamin B-12 232 - 1,245 pg/mL 282    Folate >3.0 ng/mL 7.9     Medications   Prior to Admission medications   Medication Sig Start Date End Date Taking? Authorizing Provider  B-D ULTRAFINE III SHORT PEN 31G X 8 MM MISC  05/17/14   [provider]  Beclomethasone Dipropionate (QNASL) 80 MCG/ACT AERS Place 1 spray into the nose daily. As needed    [provider]  BUTRANS 10 MCG/HR [place] patch as needed.  11/20/13   [provider]  BYDUREON 2 MG SUSR Inject 2 mg as directed once a week.  11/21/13   [provider]  canagliflozin (INVOKANA) 100 MG TABS tablet Take 100 mg by mouth daily. 11/13/15   [provider]  cyclobenzaprine (FLEXERIL) 10 MG tablet 3 (three) times daily as needed.  11/01/13   [provider]  cycloSPORINE (RESTASIS) 0.05 % ophthalmic emulsion Place 1 drop into both eyes 2 (two) times daily.    [provider]  diclofenac sodium (VOLTAREN) 1 % GEL Apply 2 g topically 2 (two) times daily.    [provider]  diphenoxylate-atropine (LOMOTIL) 2.5-0.025 MG per tablet Take 2 tablets by mouth 4 (four) times daily as needed for diarrhea or loose stools.    [provider]  esomeprazole (NEXIUM) 40 MG capsule Take 40 mg by mouth 2 (two) times daily.    [provider]  folic acid (FOLVITE) 1 MG tablet Take 1 mg by mouth daily.    [provider]  gabapentin (NEURONTIN) 100 MG capsule Take 1 capsule by mouth daily. 06/30/18   [provider]  HYDROcodone-acetaminophen (NORCO/VICODIN) 5-325 MG tablet Take 1 tablet by mouth as needed. 04/10/10   [provider]  ibuprofen (ADVIL,MOTRIN) 800 MG tablet Take 2 tablets by mouth as needed. 04/10/10   [provider]  insulin lispro (HUMALOG) 100 UNIT/ML KiwkPen Inject into the skin.    [provider]   l-methylfolate-B6-B12 (METANX) 3-35-2 MG TABS Take 1 tablet by mouth 3 (three) times daily.    [provider]  levalbuterol Penne Lash) 0.63 MG/3ML nebulizer solution every 6 (six) hours as needed.  03/07/14   [provider]  levothyroxine (SYNTHROID, LEVOTHROID) 150 MCG tablet Take 150 mcg by mouth daily before breakfast.    [provider]  meclizine (ANTIVERT) 25 MG tablet Take 25 mg by mouth as needed.  06/27/13   [provider]  metFORMIN (GLUCOPHAGE-XR) 500 MG 24 hr tablet 500 mg 2 (two) times daily.  06/27/13   [provider]  metoprolol (LOPRESSOR) 100 MG tablet Take 1 tablet (100 mg total) by mouth 2 (two) times daily. Patient taking differently: Take 75 mg by mouth 2 (two) times  daily.  07/24/14   Isaiah Serge, NP  mometasone (ELOCON) 0.1 % cream  04/03/13   [provider]  mometasone (NASONEX) 50 MCG/ACT nasal spray Place 2 sprays into the nose daily.    [provider]  montelukast (SINGULAIR) 10 MG tablet Take 10 mg by mouth at bedtime.    [provider]  Multiple Vitamins-Minerals (MULTIVITAMIN ADULT PO) Take 1 tablet by mouth daily.    [provider]  mycophenolate (CELLCEPT) 500 MG tablet Take 500 mg by mouth See admin instructions. Patient states she takes 3 tabs in the AM and 2 tab in the PM    [provider]  NON FORMULARY IVIG treatments every 6 weeks at [facility]    [provider]  olmesartan (BENICAR) 20 MG tablet Take 20 mg by mouth daily.    [provider]  ONE TOUCH ULTRA TEST test strip  11/07/13   [provider]  Jonetta Speak LANCETS 79G [place]  07/23/15   [provider]  predniSONE (DELTASONE) 10 MG tablet Take 10 mg by mouth 2 (two) times daily.    [provider]  PROAIR HFA 108 (90 BASE) MCG/ACT inhaler Inhale 2 puffs into the lungs every 6 (six) hours as needed.  06/02/13   [provider]  promethazine (PHENERGAN) 25 MG  tablet Take 25 mg by mouth every 6 (six) hours as needed for nausea or vomiting.    [provider]  simvastatin (ZOCOR) 40 MG tablet Take 1 tablet by mouth daily. 08/30/18   [provider]  spironolactone (ALDACTONE) 25 MG tablet Take 1 tablet (25 mg total) by mouth daily. 02/15/19 05/16/19  Adrian Prows, MD  SYMBICORT 160-4.5 MCG/ACT inhaler USE 2 INHALATIONS TWICE A DAY TO PREVENT COUGH OR WHEEZE. RINSE, GARGLE AND SPIT AFTER USE 07/12/15   Charlies Silvers, MD  traMADol (ULTRAM) 50 MG tablet Take 50 mg by mouth as needed.  12/04/13   [provider]  TRANSDERM-SCOP, 1.5 MG, 1 MG/3DAYS  07/24/15   [provider]  Vitamin D, Ergocalciferol, (DRISDOL) 50000 UNITS CAPS capsule Take 50,000 Units by mouth 2 (two) times a week.  12/12/13   [provider]  zolpidem (AMBIEN) 5 MG tablet Take 1 tablet by mouth at bedtime as needed. 05/15/14   [provider]    Current Outpatient Medications  Medication Instructions  . B-D ULTRAFINE III SHORT PEN 31G X 8 MM MISC No dose, route, or frequency recorded.  . Biotin 5000 MCG CAPS 1 capsule, Oral, BH-each morning  . BLACK ELDERBERRY PO 50 mg, Oral, Daily  . BUTRANS 10 MCG/HR PTWK patch As needed  . Bydureon 2 mg, Weekly  . canagliflozin (INVOKANA) 300 mg, Oral, Daily before breakfast  . Continuous Blood Gluc Receiver (FREESTYLE LIBRE 14 DAY READER) DEVI Does not apply  . Cyanocobalamin (VITAMIN B 12 PO) 1,000 mcg, Oral, BH-each morning  . cycloSPORINE (RESTASIS) 0.05 % ophthalmic emulsion 1 drop, 2 times daily  . diclofenac sodium (VOLTAREN) 2 g, Topical, As needed  . diphenhydrAMINE in sodium chloride 0.9 % 50 mL Intravenous, As directed  . esomeprazole (NEXIUM) 40 mg, 2 times daily  . ezetimibe (ZETIA) 10 mg, Oral, Daily  . ibuprofen (ADVIL,MOTRIN) 800 MG tablet 2 tablets, Oral, As needed  . Immune Globulin, Human, 40 GM/400ML SOLN Injection, Every 6 weeks  . Insulin Disposable Pump (OMNIPOD DASH SYSTEM)  KIT Does not apply  . insulin lispro (HUMALOG) 10-15 Units, Subcutaneous, 3 times daily before  meals  . levalbuterol (XOPENEX) 0.63 MG/3ML nebulizer solution Every 6 hours PRN  . Levothyroxine Sodium 137 mcg, Oral, Daily before breakfast  . meclizine (ANTIVERT) 25 mg, As needed  . mometasone (NASONEX) 50 MCG/ACT nasal spray 2 sprays, Nasal, Daily  . montelukast (SINGULAIR) 10 mg, Oral, BH-each morning  . mycophenolate (CELLCEPT) 500 mg, Oral, See admin instructions, Patient states she takes 3 tabs in the AM and 3 tab in the PM   . NON FORMULARY IVIG treatments every 6 weeks at [facility]  . NON FORMULARY C-pap  . olmesartan (BENICAR) 20 mg, Daily  . predniSONE (DELTASONE) 10 mg, Oral, BH-each morning  . PROAIR HFA 108 (90 BASE) MCG/ACT inhaler 2 puffs, Inhalation, Every 6 hours PRN  . riTUXimab 375 mg/m2 in sodium chloride 0.9 % 375 mg/m2, Intravenous, Every 14 days  . simvastatin (ZOCOR) 40 mg, Oral, Daily-1800  . spironolactone-hydrochlorothiazide (ALDACTAZIDE) 25-25 MG tablet 1 tablet, Oral, Daily  . SYMBICORT 160-4.5 MCG/ACT inhaler USE 2 INHALATIONS TWICE A DAY TO PREVENT COUGH OR WHEEZE. RINSE, GARGLE AND SPIT AFTER USE  . TRANSDERM-SCOP, 1.5 MG, 1 MG/3DAYS 1 patch, Transdermal, As needed  . Vitamin D (Ergocalciferol) (DRISDOL) 50,000 Units, Oral, Weekly  . zolpidem (AMBIEN) 5 MG tablet 1 tablet, Oral, At bedtime PRN    Cardiac Studies:   Echocardiogram 07/22/2017: Left ventricle cavity is normal in size. Moderate concentric hypertrophy of the left ventricle. Normal global wall motion. Normal diastolic filling pattern. Calculated EF 55%. Left atrial cavity is normal in size. Possible incidental patent foramen ovale is present by color flow Doppler. Trileaflet aortic valve with trace regurgitation. Trace tricuspid regurgitation. Unable to estimate PA pressure due to absence/minimal TR signal. Insignificant pericardial effusion.  Lexiscan myoview stress test 06/25/2017: 1. Resting EKG NSR.  Stress EKG: Non diagnostic for ischemia due to pharmacologic stress testing. No ST-T changes of ischemia. 8/10 chest pain with Lexiscan infusion. 2. Myocardial perfusion study was normal, without evidence of ischemia, ejection fraction 70%. Low risk study.  Carotid artery duplex  04/10/2019: Stenosis in the right internal carotid artery (50-69%), upper end of spectrum. Stenosis in the left internal carotid artery (50-69%), lower end of spectrum. Antegrade right vertebral artery flow. Antegrade left vertebral artery flow. Compared to 09/22/2018, no significant change. Follow up in six months is appropriate if clinically indicated.   Assessment     ICD-10-CM   1. Hypercholesterolemia  E78.00 Lipid Panel With LDL/HDL Ratio    Lipid Panel With LDL/HDL Ratio  2. Essential hypertension  I10   3. Anemia of chronic disease  D63.8     EKG 04/14/2019: Normal sinus rhythm at rate of 98 bpm, normal axis.  No evidence of ischemia, normal EKG.  Recommendations:   [name]  is a 68 y.o. African-American female  with  asymptomatic carotid stenosis, hypertension, hyperlipidemia, exertional dyspnea and chest pain,  uncontrolled type 2 diabetes, thyroid nodules, follows Dr. Chalmers Cater, myasthenia gravis follows [facility] with immunotherapy, and GERD.   I seen her 6 weeks ago and due to uncontrolled hyperlipidemia, I had added Zetia which she is tolerating.  Anemia panel revealed normal iron levels were low normal B12 levels for which she is on supplements for now. Blood pressure has been well-controlled on the present medical regimen.  TSH continues to be low, she does have history of thyroid nodules but also has hypothyroidism and is on thyroid supplements, we will forward the results of the study to Dr. Chalmers Cater, [place] (endocrines).  Although LDL is elevated, we'll  continue present medications, I'll recheck lipids in 6 months and will wait for thyroid function to be normalized.  Adrian Prows, MD, [facility] 06/24/2019,  3:19 PM [facility]. [place] Pager: [phone] Office: [phone] If no answer Cell [phone]

## 2019-06-26 DIAGNOSIS — G4733 Obstructive sleep apnea (adult) (pediatric): Secondary | ICD-10-CM | POA: Diagnosis not present

## 2019-07-04 DIAGNOSIS — E559 Vitamin D deficiency, unspecified: Secondary | ICD-10-CM | POA: Diagnosis not present

## 2019-07-04 DIAGNOSIS — I10 Essential (primary) hypertension: Secondary | ICD-10-CM | POA: Diagnosis not present

## 2019-07-04 DIAGNOSIS — E890 Postprocedural hypothyroidism: Secondary | ICD-10-CM | POA: Diagnosis not present

## 2019-07-04 DIAGNOSIS — E1165 Type 2 diabetes mellitus with hyperglycemia: Secondary | ICD-10-CM | POA: Diagnosis not present

## 2019-07-11 DIAGNOSIS — E1140 Type 2 diabetes mellitus with diabetic neuropathy, unspecified: Secondary | ICD-10-CM | POA: Diagnosis not present

## 2019-07-11 DIAGNOSIS — Z0000 Encounter for general adult medical examination without abnormal findings: Secondary | ICD-10-CM | POA: Diagnosis not present

## 2019-07-11 DIAGNOSIS — Z85850 Personal history of malignant neoplasm of thyroid: Secondary | ICD-10-CM | POA: Diagnosis not present

## 2019-07-14 DIAGNOSIS — G7000 Myasthenia gravis without (acute) exacerbation: Secondary | ICD-10-CM | POA: Diagnosis not present

## 2019-07-22 DIAGNOSIS — Z794 Long term (current) use of insulin: Secondary | ICD-10-CM | POA: Diagnosis not present

## 2019-07-22 DIAGNOSIS — E119 Type 2 diabetes mellitus without complications: Secondary | ICD-10-CM | POA: Diagnosis not present

## 2019-07-24 DIAGNOSIS — I10 Essential (primary) hypertension: Secondary | ICD-10-CM | POA: Diagnosis not present

## 2019-07-24 DIAGNOSIS — C73 Malignant neoplasm of thyroid gland: Secondary | ICD-10-CM | POA: Diagnosis not present

## 2019-07-24 DIAGNOSIS — E1165 Type 2 diabetes mellitus with hyperglycemia: Secondary | ICD-10-CM | POA: Diagnosis not present

## 2019-07-24 DIAGNOSIS — E890 Postprocedural hypothyroidism: Secondary | ICD-10-CM | POA: Diagnosis not present

## 2019-08-16 DIAGNOSIS — E119 Type 2 diabetes mellitus without complications: Secondary | ICD-10-CM | POA: Diagnosis not present

## 2019-08-16 DIAGNOSIS — Z794 Long term (current) use of insulin: Secondary | ICD-10-CM | POA: Diagnosis not present

## 2019-08-21 ENCOUNTER — Other Ambulatory Visit: Payer: Self-pay

## 2019-08-21 DIAGNOSIS — I6523 Occlusion and stenosis of bilateral carotid arteries: Secondary | ICD-10-CM

## 2019-08-21 MED ORDER — SPIRONOLACTONE-HCTZ 25-25 MG PO TABS: 1.0000 | ORAL_TABLET | Freq: Every day | ORAL | 3 refills | Status: DC

## 2019-08-25 DIAGNOSIS — I639 Cerebral infarction, unspecified: Secondary | ICD-10-CM

## 2019-08-25 HISTORY — DX: Cerebral infarction, unspecified: I63.9

## 2019-09-16 DIAGNOSIS — Z794 Long term (current) use of insulin: Secondary | ICD-10-CM | POA: Diagnosis not present

## 2019-09-16 DIAGNOSIS — E119 Type 2 diabetes mellitus without complications: Secondary | ICD-10-CM | POA: Diagnosis not present

## 2019-09-25 DIAGNOSIS — G4733 Obstructive sleep apnea (adult) (pediatric): Secondary | ICD-10-CM | POA: Diagnosis not present

## 2019-10-11 ENCOUNTER — Telehealth: Payer: Self-pay

## 2019-10-17 DIAGNOSIS — E119 Type 2 diabetes mellitus without complications: Secondary | ICD-10-CM | POA: Diagnosis not present

## 2019-10-17 DIAGNOSIS — Z794 Long term (current) use of insulin: Secondary | ICD-10-CM | POA: Diagnosis not present

## 2019-10-20 ENCOUNTER — Ambulatory Visit: Payer: Medicare Other | Admitting: Cardiology

## 2019-11-03 DIAGNOSIS — H469 Unspecified optic neuritis: Secondary | ICD-10-CM | POA: Diagnosis not present

## 2019-11-03 DIAGNOSIS — Z79899 Other long term (current) drug therapy: Secondary | ICD-10-CM | POA: Diagnosis not present

## 2019-11-03 DIAGNOSIS — Z85850 Personal history of malignant neoplasm of thyroid: Secondary | ICD-10-CM | POA: Diagnosis not present

## 2019-11-03 DIAGNOSIS — H3500 Unspecified background retinopathy: Secondary | ICD-10-CM | POA: Diagnosis not present

## 2019-11-03 DIAGNOSIS — E113293 Type 2 diabetes mellitus with mild nonproliferative diabetic retinopathy without macular edema, bilateral: Secondary | ICD-10-CM | POA: Diagnosis not present

## 2019-11-10 DIAGNOSIS — E559 Vitamin D deficiency, unspecified: Secondary | ICD-10-CM | POA: Diagnosis not present

## 2019-11-10 DIAGNOSIS — E1165 Type 2 diabetes mellitus with hyperglycemia: Secondary | ICD-10-CM | POA: Diagnosis not present

## 2019-11-10 DIAGNOSIS — I10 Essential (primary) hypertension: Secondary | ICD-10-CM | POA: Diagnosis not present

## 2019-11-10 DIAGNOSIS — E890 Postprocedural hypothyroidism: Secondary | ICD-10-CM | POA: Diagnosis not present

## 2019-11-13 DIAGNOSIS — G7000 Myasthenia gravis without (acute) exacerbation: Secondary | ICD-10-CM | POA: Diagnosis not present

## 2019-11-13 DIAGNOSIS — D8989 Other specified disorders involving the immune mechanism, not elsewhere classified: Secondary | ICD-10-CM | POA: Diagnosis not present

## 2019-11-17 DIAGNOSIS — E119 Type 2 diabetes mellitus without complications: Secondary | ICD-10-CM | POA: Diagnosis not present

## 2019-11-17 DIAGNOSIS — Z794 Long term (current) use of insulin: Secondary | ICD-10-CM | POA: Diagnosis not present

## 2019-11-27 DIAGNOSIS — C73 Malignant neoplasm of thyroid gland: Secondary | ICD-10-CM | POA: Diagnosis not present

## 2019-11-27 DIAGNOSIS — E1165 Type 2 diabetes mellitus with hyperglycemia: Secondary | ICD-10-CM | POA: Diagnosis not present

## 2019-11-27 DIAGNOSIS — E890 Postprocedural hypothyroidism: Secondary | ICD-10-CM | POA: Diagnosis not present

## 2019-11-27 DIAGNOSIS — I10 Essential (primary) hypertension: Secondary | ICD-10-CM | POA: Diagnosis not present

## 2019-11-29 DIAGNOSIS — G7000 Myasthenia gravis without (acute) exacerbation: Secondary | ICD-10-CM | POA: Diagnosis not present

## 2019-11-29 DIAGNOSIS — D8989 Other specified disorders involving the immune mechanism, not elsewhere classified: Secondary | ICD-10-CM | POA: Diagnosis not present

## 2019-12-04 ENCOUNTER — Other Ambulatory Visit: Payer: Medicare Other

## 2019-12-08 ENCOUNTER — Other Ambulatory Visit: Payer: Self-pay

## 2019-12-08 DIAGNOSIS — I6523 Occlusion and stenosis of bilateral carotid arteries: Secondary | ICD-10-CM

## 2019-12-11 ENCOUNTER — Ambulatory Visit: Payer: Medicare Other

## 2019-12-11 ENCOUNTER — Other Ambulatory Visit: Payer: Self-pay

## 2019-12-11 DIAGNOSIS — I6523 Occlusion and stenosis of bilateral carotid arteries: Secondary | ICD-10-CM

## 2019-12-17 ENCOUNTER — Other Ambulatory Visit: Payer: Self-pay | Admitting: Cardiology

## 2019-12-17 DIAGNOSIS — I6523 Occlusion and stenosis of bilateral carotid arteries: Secondary | ICD-10-CM

## 2019-12-18 ENCOUNTER — Ambulatory Visit: Payer: Medicare Other | Admitting: Cardiology

## 2019-12-18 DIAGNOSIS — Z794 Long term (current) use of insulin: Secondary | ICD-10-CM | POA: Diagnosis not present

## 2019-12-18 DIAGNOSIS — E119 Type 2 diabetes mellitus without complications: Secondary | ICD-10-CM | POA: Diagnosis not present

## 2019-12-19 ENCOUNTER — Emergency Department (HOSPITAL_COMMUNITY): Payer: Medicare Other

## 2019-12-19 ENCOUNTER — Emergency Department (HOSPITAL_BASED_OUTPATIENT_CLINIC_OR_DEPARTMENT_OTHER): Payer: Medicare Other

## 2019-12-19 ENCOUNTER — Other Ambulatory Visit: Payer: Self-pay

## 2019-12-19 ENCOUNTER — Encounter (HOSPITAL_BASED_OUTPATIENT_CLINIC_OR_DEPARTMENT_OTHER): Payer: Self-pay | Admitting: Emergency Medicine

## 2019-12-19 ENCOUNTER — Inpatient Hospital Stay (HOSPITAL_BASED_OUTPATIENT_CLINIC_OR_DEPARTMENT_OTHER)
Admission: EM | Admit: 2019-12-19 | Discharge: 2019-12-27 | DRG: 065 | Disposition: A | Discharge status: Rehabilitation Facility | Payer: Medicare Other | Attending: Internal Medicine | Admitting: Internal Medicine

## 2019-12-19 DIAGNOSIS — Z9049 Acquired absence of other specified parts of digestive tract: Secondary | ICD-10-CM

## 2019-12-19 DIAGNOSIS — Z7952 Long term (current) use of systemic steroids: Secondary | ICD-10-CM

## 2019-12-19 DIAGNOSIS — E1139 Type 2 diabetes mellitus with other diabetic ophthalmic complication: Secondary | ICD-10-CM | POA: Diagnosis present

## 2019-12-19 DIAGNOSIS — I639 Cerebral infarction, unspecified: Secondary | ICD-10-CM | POA: Diagnosis present

## 2019-12-19 DIAGNOSIS — E1140 Type 2 diabetes mellitus with diabetic neuropathy, unspecified: Secondary | ICD-10-CM | POA: Diagnosis present

## 2019-12-19 DIAGNOSIS — H0520 Unspecified exophthalmos: Secondary | ICD-10-CM | POA: Diagnosis present

## 2019-12-19 DIAGNOSIS — Z6831 Body mass index (BMI) 31.0-31.9, adult: Secondary | ICD-10-CM

## 2019-12-19 DIAGNOSIS — Z8249 Family history of ischemic heart disease and other diseases of the circulatory system: Secondary | ICD-10-CM

## 2019-12-19 DIAGNOSIS — G4733 Obstructive sleep apnea (adult) (pediatric): Secondary | ICD-10-CM | POA: Diagnosis not present

## 2019-12-19 DIAGNOSIS — Z87892 Personal history of anaphylaxis: Secondary | ICD-10-CM

## 2019-12-19 DIAGNOSIS — Z981 Arthrodesis status: Secondary | ICD-10-CM

## 2019-12-19 DIAGNOSIS — R0602 Shortness of breath: Secondary | ICD-10-CM

## 2019-12-19 DIAGNOSIS — Z7951 Long term (current) use of inhaled steroids: Secondary | ICD-10-CM

## 2019-12-19 DIAGNOSIS — Z961 Presence of intraocular lens: Secondary | ICD-10-CM | POA: Diagnosis present

## 2019-12-19 DIAGNOSIS — I6523 Occlusion and stenosis of bilateral carotid arteries: Secondary | ICD-10-CM | POA: Diagnosis not present

## 2019-12-19 DIAGNOSIS — Z9641 Presence of insulin pump (external) (internal): Secondary | ICD-10-CM | POA: Diagnosis present

## 2019-12-19 DIAGNOSIS — R079 Chest pain, unspecified: Secondary | ICD-10-CM | POA: Diagnosis not present

## 2019-12-19 DIAGNOSIS — G894 Chronic pain syndrome: Secondary | ICD-10-CM | POA: Diagnosis not present

## 2019-12-19 DIAGNOSIS — E890 Postprocedural hypothyroidism: Secondary | ICD-10-CM | POA: Diagnosis not present

## 2019-12-19 DIAGNOSIS — R29701 NIHSS score 1: Secondary | ICD-10-CM | POA: Diagnosis present

## 2019-12-19 DIAGNOSIS — R29810 Facial weakness: Secondary | ICD-10-CM | POA: Diagnosis present

## 2019-12-19 DIAGNOSIS — Z801 Family history of malignant neoplasm of trachea, bronchus and lung: Secondary | ICD-10-CM

## 2019-12-19 DIAGNOSIS — I2510 Atherosclerotic heart disease of native coronary artery without angina pectoris: Secondary | ICD-10-CM | POA: Diagnosis not present

## 2019-12-19 DIAGNOSIS — I6381 Other cerebral infarction due to occlusion or stenosis of small artery: Principal | ICD-10-CM | POA: Diagnosis present

## 2019-12-19 DIAGNOSIS — J45909 Unspecified asthma, uncomplicated: Secondary | ICD-10-CM

## 2019-12-19 DIAGNOSIS — G8191 Hemiplegia, unspecified affecting right dominant side: Secondary | ICD-10-CM | POA: Diagnosis present

## 2019-12-19 DIAGNOSIS — Z823 Family history of stroke: Secondary | ICD-10-CM

## 2019-12-19 DIAGNOSIS — G7000 Myasthenia gravis without (acute) exacerbation: Secondary | ICD-10-CM | POA: Diagnosis not present

## 2019-12-19 DIAGNOSIS — I10 Essential (primary) hypertension: Secondary | ICD-10-CM | POA: Diagnosis not present

## 2019-12-19 DIAGNOSIS — C73 Malignant neoplasm of thyroid gland: Secondary | ICD-10-CM | POA: Diagnosis present

## 2019-12-19 DIAGNOSIS — Z794 Long term (current) use of insulin: Secondary | ICD-10-CM

## 2019-12-19 DIAGNOSIS — E785 Hyperlipidemia, unspecified: Secondary | ICD-10-CM | POA: Diagnosis not present

## 2019-12-19 DIAGNOSIS — Z885 Allergy status to narcotic agent status: Secondary | ICD-10-CM

## 2019-12-19 DIAGNOSIS — E1165 Type 2 diabetes mellitus with hyperglycemia: Secondary | ICD-10-CM

## 2019-12-19 DIAGNOSIS — Z91048 Other nonmedicinal substance allergy status: Secondary | ICD-10-CM

## 2019-12-19 DIAGNOSIS — J4540 Moderate persistent asthma, uncomplicated: Secondary | ICD-10-CM | POA: Diagnosis not present

## 2019-12-19 DIAGNOSIS — Z79890 Hormone replacement therapy (postmenopausal): Secondary | ICD-10-CM

## 2019-12-19 DIAGNOSIS — E7800 Pure hypercholesterolemia, unspecified: Secondary | ICD-10-CM | POA: Diagnosis present

## 2019-12-19 DIAGNOSIS — K219 Gastro-esophageal reflux disease without esophagitis: Secondary | ICD-10-CM | POA: Diagnosis present

## 2019-12-19 DIAGNOSIS — Z9842 Cataract extraction status, left eye: Secondary | ICD-10-CM

## 2019-12-19 DIAGNOSIS — Z20822 Contact with and (suspected) exposure to covid-19: Secondary | ICD-10-CM | POA: Diagnosis not present

## 2019-12-19 DIAGNOSIS — E1169 Type 2 diabetes mellitus with other specified complication: Secondary | ICD-10-CM | POA: Diagnosis present

## 2019-12-19 DIAGNOSIS — Z90710 Acquired absence of both cervix and uterus: Secondary | ICD-10-CM

## 2019-12-19 DIAGNOSIS — IMO0002 Reserved for concepts with insufficient information to code with codable children: Secondary | ICD-10-CM

## 2019-12-19 DIAGNOSIS — Z791 Long term (current) use of non-steroidal anti-inflammatories (NSAID): Secondary | ICD-10-CM

## 2019-12-19 DIAGNOSIS — Z833 Family history of diabetes mellitus: Secondary | ICD-10-CM

## 2019-12-19 DIAGNOSIS — Z825 Family history of asthma and other chronic lower respiratory diseases: Secondary | ICD-10-CM

## 2019-12-19 DIAGNOSIS — R072 Precordial pain: Secondary | ICD-10-CM | POA: Diagnosis not present

## 2019-12-19 DIAGNOSIS — Z9089 Acquired absence of other organs: Secondary | ICD-10-CM

## 2019-12-19 DIAGNOSIS — E669 Obesity, unspecified: Secondary | ICD-10-CM | POA: Diagnosis present

## 2019-12-19 DIAGNOSIS — J449 Chronic obstructive pulmonary disease, unspecified: Secondary | ICD-10-CM | POA: Diagnosis present

## 2019-12-19 DIAGNOSIS — Z79899 Other long term (current) drug therapy: Secondary | ICD-10-CM

## 2019-12-19 DIAGNOSIS — Z91041 Radiographic dye allergy status: Secondary | ICD-10-CM

## 2019-12-19 DIAGNOSIS — Z9841 Cataract extraction status, right eye: Secondary | ICD-10-CM

## 2019-12-19 LAB — HEPATIC FUNCTION PANEL
ALT: 24 U/L (ref 0–44)
AST: 22 U/L (ref 15–41)
Albumin: 3.8 g/dL (ref 3.5–5.0)
Alkaline Phosphatase: 98 U/L (ref 38–126)
Bilirubin, Direct: <0.1 mg/dL (ref 0.0–0.2)
Total Bilirubin: 0.5 mg/dL (ref 0.3–1.2)
Total Protein: 7.8 g/dL (ref 6.5–8.1)

## 2019-12-19 LAB — CBC
HCT: 36.2 % (ref 36.0–46.0)
Hemoglobin: 11.8 g/dL — ABNORMAL LOW (ref 12.0–15.0)
MCH: 30.7 pg (ref 26.0–34.0)
MCHC: 32.6 g/dL (ref 30.0–36.0)
MCV: 94.3 fL (ref 80.0–100.0)
Platelets: 261 10*3/uL (ref 150–400)
RBC: 3.84 MIL/uL — ABNORMAL LOW (ref 3.87–5.11)
RDW: 13.1 % (ref 11.5–15.5)
WBC: 6.7 10*3/uL (ref 4.0–10.5)
nRBC: 0 % (ref 0.0–0.2)

## 2019-12-19 LAB — BASIC METABOLIC PANEL
Anion gap: 10 (ref 5–15)
BUN: 20 mg/dL (ref 8–23)
CO2: 24 mmol/L (ref 22–32)
Calcium: 10.1 mg/dL (ref 8.9–10.3)
Chloride: 106 mmol/L (ref 98–111)
Creatinine, Ser: 0.69 mg/dL (ref 0.44–1.00)
GFR calc Af Amer: >60 mL/min (ref >60)
GFR calc non Af Amer: >60 mL/min (ref >60)
Glucose, Bld: 124 mg/dL — ABNORMAL HIGH (ref 70–99)
Potassium: 3.3 mmol/L — ABNORMAL LOW (ref 3.5–5.1)
Sodium: 140 mmol/L (ref 135–145)

## 2019-12-19 LAB — BRAIN NATRIURETIC PEPTIDE: B Natriuretic Peptide: 21.5 pg/mL (ref 0.0–100.0)

## 2019-12-19 LAB — D-DIMER, QUANTITATIVE: D-Dimer, Quant: 1.01 ug/mL-FEU — ABNORMAL HIGH (ref 0.00–0.50)

## 2019-12-19 LAB — TROPONIN I (HIGH SENSITIVITY)
Troponin I (High Sensitivity): 3 ng/L (ref <18)
Troponin I (High Sensitivity): 4 ng/L (ref <18)

## 2019-12-19 LAB — CBG MONITORING, ED: Glucose-Capillary: 148 mg/dL — ABNORMAL HIGH (ref 70–99)

## 2019-12-19 MED ORDER — ACETAMINOPHEN 325 MG PO TABS: 650.0000 mg | ORAL_TABLET | Freq: Once | ORAL | Status: DC

## 2019-12-19 MED ORDER — HYDROCORTISONE NA SUCCINATE PF 250 MG IJ SOLR
200.0000 mg | Freq: Once | INTRAMUSCULAR | Status: DC
  Filled 2019-12-19: qty 200

## 2019-12-19 MED ORDER — DIPHENHYDRAMINE HCL 50 MG/ML IJ SOLN: 50.0000 mg | Freq: Once | INTRAMUSCULAR | Status: DC

## 2019-12-19 MED ORDER — DIPHENHYDRAMINE HCL 25 MG PO CAPS: 25.0000 mg | ORAL_CAPSULE | Freq: Once | ORAL | Status: DC

## 2019-12-19 MED ORDER — DIPHENHYDRAMINE HCL 25 MG PO CAPS: 50.0000 mg | ORAL_CAPSULE | Freq: Once | ORAL | Status: DC

## 2019-12-19 NOTE — ED Provider Notes (Signed)
[facility] EMERGENCY DEPARTMENT Provider Note   CSN: [id number] Arrival date & time: 12/19/19  1026     History Chief Complaint  Patient presents with  . Chest Pain    [name] is a 69 y.o. female.  The history is provided by the patient and medical records. No language interpreter was used.  Chest Pain  [name] is a 69 y.o. female who presents to the Emergency Department complaining of chest pain.  She presents to the ED complaining of left sided chest pain that started last night.  She has associated numbness in her right lower face.  She also complains of sob and BLE edema.  Denies associated fevers, cough, abdominal, N/V/D.  Pain is described as a weight on her chest.  Pain is constant.  No change with breathing, movement or activity.  She has experienced similar sxs in the past but does not recall what the outcome was.  She sees Dr. Einar Gip with Cardiology and has a follow up appointment next Thursday.  No recent medication changes or missed medications.  Has a hx/o myasthenia gravis and DM.  She takes IVIG at home, next treatment on 5/10.  She is also on rituxan, cellcept, prednisone.      Past Medical History:  Diagnosis Date  . Arthritis    "all over my body" (03/13/2013)  . Asthma   . Asymptomatic carotid artery stenosis, bilateral 10/08/2018  . Chest pain at rest    "woke me up in my sleep" (03/13/2013)  . Corns and callosities   . Difficulty in walking(719.7)   . GERD (gastroesophageal reflux disease)   . H/O hiatal hernia   . Heart murmur   . High cholesterol   . History of stress test 07/11/2010   compared to previous study there is no significant change, Normal Myocardial Perfusion study, this is a low risk scan  . Hx of echocardiogram 07/08/2010   EF> 55% normal 2D Echo  . Hypercholesterolemia 10/08/2018  . Hypertension   . Hypothyroidism   . Myasthenia gravis ([place])   . Myasthenia gravis ([place])    "in my eyes; diagnsosed > 7 yr ago"  (03/13/2013)  . Palpitations   . Shortness of breath    "can happen at anytime" (03/13/2013)  . Sleep apnea    "use to wear mask; cause of insurance purposes I'm not not" (03/13/2013)  . Thyroid carcinoma ([place])   . Type II diabetes mellitus [facility])     Patient Active Problem List   Diagnosis Date Noted  . Asymptomatic carotid artery stenosis, bilateral 10/08/2018  . Hypercholesterolemia 10/08/2018  . Headache around the eyes 11/12/2016  . Cervicalgia of occipito-atlanto-axial region 11/12/2016  . Moderate persistent asthma 07/29/2015  . Current use of beta blocker 07/29/2015  . Gastroesophageal reflux disease without esophagitis 07/29/2015  . OSA on CPAP 09/19/2014  . Diabetic neuropathy with neurologic complication ([place]) 43/32/9518  . Myasthenia gravis in remission ([place]) 09/19/2014  . Dyspnea 08/13/2014  . 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 Cancer of thyroid ([place]) 07/27/2014  . OSA (obstructive sleep apnea) 07/24/2014  . DM (diabetes mellitus) type II uncontrolled with eye manifestation ([place]) 06/01/2014  . 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 Nocturia more than twice per night 06/01/2014  . Snoring 06/01/2014  . Insomnia due to medical condition 06/01/2014  . Neuroma 01/16/2014  . Pseudoclaudication 11/14/2013  . Heart palpitations 06/30/2013  . Sinus tachycardia 03/29/2013  . Myasthenia gravis ([place]) 03/13/2013  . Essential hypertension,  benign 03/13/2013  . Asthma, chronic 03/13/2013    Past Surgical History:  Procedure Laterality Date  . ABDOMINAL HYSTERECTOMY    . ANTERIOR CERVICAL DECOMP/DISCECTOMY FUSION     "I've had severa ORs; always went in from the front" (03/13/2013)  . APPENDECTOMY    . CARDIAC CATHETERIZATION     "several" (03/13/2013)  . CARPAL TUNNEL RELEASE Right   . CATARACT EXTRACTION W/ INTRAOCULAR LENS  IMPLANT, BILATERAL Bilateral   . CHOLECYSTECTOMY    . KNEE ARTHROSCOPY Left   . SHOULDER ARTHROSCOPY W/ ROTATOR CUFF REPAIR Left   .  TONSILLECTOMY    . TOTAL THYROIDECTOMY       OB History   No obstetric history on file.     Family History  Problem Relation Age of Onset  . Coronary artery disease Sister        s/p coronary stenting  . Congestive Heart Failure Mother   . Asthma Mother   . Congestive Heart Failure Father   . Lung cancer Father   . Asthma Father     Social History   Tobacco Use  . Smoking status: Never Smoker  . Smokeless tobacco: Never Used  Substance Use Topics  . Alcohol use: No    Alcohol/week: 0.0 standard drinks  . Drug use: No    Home Medications Prior to Admission medications   Medication Sig Start Date End Date Taking? Authorizing Provider  B-D ULTRAFINE III SHORT PEN 31G X 8 MM MISC  05/17/14   [provider]  Biotin 5000 MCG CAPS Take 1 capsule by mouth every morning.    [provider]  BLACK ELDERBERRY PO Take 50 mg by mouth daily.    [provider]  BUTRANS 10 MCG/HR [place] patch as needed.  11/20/13   [provider]  BYDUREON 2 MG SUSR Inject 2 mg as directed once a week.  11/21/13   [provider]  canagliflozin (INVOKANA) 300 MG TABS tablet Take 300 mg by mouth daily before breakfast.    [provider]  Continuous Blood Gluc Receiver (FREESTYLE LIBRE 14 DAY READER) DEVI by Does not apply route.    [provider]  Cyanocobalamin (VITAMIN B 12 PO) Take 1,000 mcg by mouth every morning.    [provider]  cycloSPORINE (RESTASIS) 0.05 % ophthalmic emulsion Place 1 drop into both eyes 2 (two) times daily.    [provider]  diclofenac sodium (VOLTAREN) 1 % GEL Apply 2 g topically as needed.    [provider]  diphenhydrAMINE in sodium chloride 0.9 % 50 mL Inject into the vein as directed.    [provider]  esomeprazole (NEXIUM) 40 MG capsule Take 40 mg by mouth 2 (two) times daily.    [provider]  ezetimibe (ZETIA) 10 MG tablet Take 1 tablet (10 mg total) by  mouth daily. 06/22/19 09/20/19  Adrian Prows, MD  ibuprofen (ADVIL,MOTRIN) 800 MG tablet Take 2 tablets by mouth as needed. 04/10/10   [provider]  Immune Globulin, Human, 40 GM/400ML SOLN Inject as directed every 6 (six) weeks.    [provider]  Insulin Disposable Pump (OMNIPOD DASH SYSTEM) KIT by Does not apply route.    [provider]  insulin lispro (HUMALOG) 100 UNIT/ML injection Inject 10-15 Units into the skin 3 (three) times daily before meals.    [provider]  levalbuterol Penne Lash) 0.63 MG/3ML nebulizer solution every 6 (six) hours as needed.  03/07/14   [provider]  Levothyroxine Sodium 137 MCG CAPS Take 137 mcg by mouth daily before breakfast.     [provider]  meclizine (ANTIVERT) 25 MG tablet Take 25 mg by mouth as needed.  06/27/13   [provider]  mometasone (NASONEX) 50 MCG/ACT nasal spray Place 2 sprays into the nose daily.    [provider]  montelukast (SINGULAIR) 10 MG tablet Take 10 mg by mouth every morning.    [provider]  mycophenolate (CELLCEPT) 500 MG tablet Take 500 mg by mouth See admin instructions. Patient states she takes 3 tabs in the AM and 3 tab in the PM     [provider]  NON FORMULARY IVIG treatments every 6 weeks at [facility]    [provider]  NON FORMULARY C-pap    [provider]  olmesartan (BENICAR) 20 MG tablet Take 20 mg by mouth daily.    [provider]  predniSONE (DELTASONE) 10 MG tablet Take 10 mg by mouth every morning.    [provider]  PROAIR HFA 108 (90 BASE) MCG/ACT inhaler Inhale 2 puffs into the lungs every 6 (six) hours as needed.  06/02/13   [provider]  riTUXimab 375 mg/m2 in sodium chloride 0.9 % Inject 375 mg/m2 into the vein every 14 (fourteen) days.    [provider]  simvastatin (ZOCOR) 40 MG tablet Take 1 tablet (40 mg total) by mouth daily at 6 PM. 06/22/19   Adrian Prows, MD  spironolactone-hydrochlorothiazide (ALDACTAZIDE) 25-25 MG tablet Take 1 tablet by mouth daily. 08/21/19   Adrian Prows, MD  SYMBICORT 160-4.5 MCG/ACT inhaler USE 2 INHALATIONS TWICE A DAY TO PREVENT COUGH OR WHEEZE. RINSE, GARGLE AND SPIT AFTER USE 07/12/15   Prince Solian A, MD  TRANSDERM-SCOP, 1.5 MG, 1 MG/3DAYS Place 1 patch onto the skin as needed. 07/24/15   [provider]  Vitamin D, Ergocalciferol, (DRISDOL) 50000 UNITS CAPS capsule Take 50,000 Units by mouth once a week. 12/12/13   [provider]  zolpidem (AMBIEN) 5 MG tablet Take 1 tablet by mouth at bedtime as needed. 05/15/14   [provider]    Allergies    Contrast media [iodinated diagnostic agents], Fluorescein, Iodine, Codeine, and Molds & smuts  Review of Systems   Review of Systems  Cardiovascular: Positive for chest pain.  All other systems reviewed and are negative.   Physical Exam Updated Vital Signs BP (!) 130/56   Pulse (!) 103   Temp 98.1 F (36.7 C) (Oral)   Resp 18   Ht '5\' 3"'  (1.6 m)   Wt 81.6 kg   SpO2 100%   BMI 31.89 kg/m   Physical Exam Vitals and nursing note reviewed.  Constitutional:      Appearance: She is well-developed.  HENT:     Head: Normocephalic and atraumatic.  Cardiovascular:     Rate and Rhythm: Normal rate and regular rhythm.     Heart sounds: No murmur.  Pulmonary:     Effort: Pulmonary effort is normal. No respiratory distress.     Breath sounds: Normal breath sounds.  Abdominal:     Palpations: Abdomen is soft.     Tenderness: There is no abdominal tenderness. There is no guarding or rebound.  Musculoskeletal:        General: No tenderness.     Comments: 1-2+ pitting edema to BLE.  2+ radial pulses bilaterally  Skin:    General: Skin is warm and dry.  Neurological:  Mental Status: She is alert and oriented to person, place, and time.     Comments: No asymmetry of facial movements.  5/5 strength in all four extremities with  sensation to light touch intact in all four extremities.    Psychiatric:        Behavior: Behavior normal.     ED Results / Procedures / Treatments   Labs (all labs ordered are listed, but only abnormal results are displayed) Labs Reviewed  BASIC METABOLIC PANEL - Abnormal; Notable for the following components:      Result Value   Potassium 3.3 (*)    Glucose, Bld 124 (*)    All other components within normal limits  CBC - Abnormal; Notable for the following components:   RBC 3.84 (*)    Hemoglobin 11.8 (*)    All other components within normal limits  D-DIMER, QUANTITATIVE (NOT AT [facility]) - Abnormal; Notable for the following components:   D-Dimer, Quant 1.01 (*)    All other components within normal limits  HEPATIC FUNCTION PANEL  BRAIN NATRIURETIC PEPTIDE  TROPONIN I (HIGH SENSITIVITY)  TROPONIN I (HIGH SENSITIVITY)    EKG EKG Interpretation  Date/Time:  Tuesday December 19 2019 10:30:42 EDT Ventricular Rate:  105 PR Interval:  146 QRS Duration: 84 QT Interval:  340 QTC Calculation: 449 R Axis:   17 Text Interpretation: Sinus tachycardia Minimal voltage criteria for LVH, may be normal variant ( R in aVL ) Cannot rule out Anterior infarct , age undetermined Abnormal ECG Confirmed by Quintella Reichert [phone]) on 12/19/2019 10:46:13 AM   Radiology No results found.  Procedures Procedures (including critical care time)  Medications Ordered in ED Medications  diphenhydrAMINE (BENADRYL) capsule 25 mg (has no administration in time range)    ED Course  I have reviewed the triage vital signs and the nursing notes.  Pertinent labs & imaging results that were available during my care of the patient were reviewed by me and considered in my medical decision making (see chart for details).    MDM Rules/Calculators/A&P                     Patient here for evaluation of left sided chest pain, shortness of breath as well as lower extremity edema. She is mildly tachycardic on  evaluation but in no acute distress. D dimer is mildly elevated. Troponin is negative times two and EKG without acute ischemic changes. Patient does have a history of IV contrast allergy previously but has tolerated IV contrast since the allergy without incident with proper premedication. Recommend obtaining CTA to rule out PE. Radiology department declines to perform CTA with premedication due to her hx of anaphylaxis and recommends VQscan.  VQ scan is not available at this facility.  D/w pt radiology recommendations.  Plan to transfer to [facility] for VQ scan, lower extremity vascular US.  D/w Dr. Melina Copa, who accepts the patient in transfer.   Pt has an omnipod for insulin, will delay cxr until just prior to VQ scan as she does not have a replacement pod with her in the ED.    Final Clinical Impression(s) / ED Diagnoses Final diagnoses:  Precordial pain  SOB (shortness of breath)    Rx / DC Orders ED Discharge Orders    None       Quintella Reichert, MD 12/19/19 1536

## 2019-12-19 NOTE — ED Provider Notes (Signed)
The patient was assessed when she arrived and is in no acute distress.  There apparently was some miscommunication with the orders that were in the computer and the services that were to override them.  Vascular was contacted and V/Q was unaware of the patient's arrival.  The nurse that had her during the day failed to contact these services.  Patient will have to be held in the emergency department overnight.  The patient has been stable.  I did explain to her the issue and the need for the morning testing.  Patient agrees this plan and all questions were answered.  I did call the nuclear medicine tech to set up the early morning VQ scan and left a message with the vascular lab to get the patient as soon as possible for her ultrasounds.   Dalia Heading, PA-C 12/19/19 [id number]    Hayden Rasmussen, MD 12/20/19 1039

## 2019-12-19 NOTE — ED Triage Notes (Signed)
Right sided chest pain since 0430.  Pt also having right hand and facial numbness since 4:30 am.   Some sob.

## 2019-12-19 NOTE — ED Notes (Signed)
Secretary attempted to call vascular in regards to pt pending scans. Gerald Stabs PA made aware that vascular told secretary they were not coming to do the scans.

## 2019-12-20 ENCOUNTER — Emergency Department (HOSPITAL_COMMUNITY): Payer: Medicare Other

## 2019-12-20 ENCOUNTER — Inpatient Hospital Stay (HOSPITAL_COMMUNITY): Payer: Medicare Other

## 2019-12-20 ENCOUNTER — Encounter (HOSPITAL_COMMUNITY): Payer: Self-pay | Admitting: Internal Medicine

## 2019-12-20 DIAGNOSIS — G894 Chronic pain syndrome: Secondary | ICD-10-CM | POA: Diagnosis not present

## 2019-12-20 DIAGNOSIS — R079 Chest pain, unspecified: Secondary | ICD-10-CM | POA: Diagnosis not present

## 2019-12-20 DIAGNOSIS — I6381 Other cerebral infarction due to occlusion or stenosis of small artery: Secondary | ICD-10-CM | POA: Diagnosis present

## 2019-12-20 DIAGNOSIS — I10 Essential (primary) hypertension: Secondary | ICD-10-CM

## 2019-12-20 DIAGNOSIS — Z7901 Long term (current) use of anticoagulants: Secondary | ICD-10-CM | POA: Diagnosis not present

## 2019-12-20 DIAGNOSIS — Z85850 Personal history of malignant neoplasm of thyroid: Secondary | ICD-10-CM | POA: Diagnosis not present

## 2019-12-20 DIAGNOSIS — E1151 Type 2 diabetes mellitus with diabetic peripheral angiopathy without gangrene: Secondary | ICD-10-CM | POA: Diagnosis not present

## 2019-12-20 DIAGNOSIS — E785 Hyperlipidemia, unspecified: Secondary | ICD-10-CM | POA: Diagnosis not present

## 2019-12-20 DIAGNOSIS — R072 Precordial pain: Secondary | ICD-10-CM | POA: Diagnosis not present

## 2019-12-20 DIAGNOSIS — Z833 Family history of diabetes mellitus: Secondary | ICD-10-CM | POA: Diagnosis not present

## 2019-12-20 DIAGNOSIS — M1990 Unspecified osteoarthritis, unspecified site: Secondary | ICD-10-CM | POA: Diagnosis not present

## 2019-12-20 DIAGNOSIS — G4733 Obstructive sleep apnea (adult) (pediatric): Secondary | ICD-10-CM | POA: Diagnosis not present

## 2019-12-20 DIAGNOSIS — R29701 NIHSS score 1: Secondary | ICD-10-CM | POA: Diagnosis present

## 2019-12-20 DIAGNOSIS — Z7952 Long term (current) use of systemic steroids: Secondary | ICD-10-CM | POA: Diagnosis not present

## 2019-12-20 DIAGNOSIS — J45909 Unspecified asthma, uncomplicated: Secondary | ICD-10-CM | POA: Diagnosis not present

## 2019-12-20 DIAGNOSIS — Z9641 Presence of insulin pump (external) (internal): Secondary | ICD-10-CM | POA: Diagnosis present

## 2019-12-20 DIAGNOSIS — Z981 Arthrodesis status: Secondary | ICD-10-CM | POA: Diagnosis not present

## 2019-12-20 DIAGNOSIS — R609 Edema, unspecified: Secondary | ICD-10-CM | POA: Diagnosis not present

## 2019-12-20 DIAGNOSIS — Z7982 Long term (current) use of aspirin: Secondary | ICD-10-CM | POA: Diagnosis not present

## 2019-12-20 DIAGNOSIS — E7800 Pure hypercholesterolemia, unspecified: Secondary | ICD-10-CM | POA: Diagnosis not present

## 2019-12-20 DIAGNOSIS — E890 Postprocedural hypothyroidism: Secondary | ICD-10-CM | POA: Diagnosis not present

## 2019-12-20 DIAGNOSIS — C73 Malignant neoplasm of thyroid gland: Secondary | ICD-10-CM | POA: Diagnosis present

## 2019-12-20 DIAGNOSIS — R29810 Facial weakness: Secondary | ICD-10-CM | POA: Diagnosis present

## 2019-12-20 DIAGNOSIS — I639 Cerebral infarction, unspecified: Secondary | ICD-10-CM | POA: Diagnosis present

## 2019-12-20 DIAGNOSIS — E1139 Type 2 diabetes mellitus with other diabetic ophthalmic complication: Secondary | ICD-10-CM | POA: Diagnosis not present

## 2019-12-20 DIAGNOSIS — J449 Chronic obstructive pulmonary disease, unspecified: Secondary | ICD-10-CM | POA: Diagnosis present

## 2019-12-20 DIAGNOSIS — I6523 Occlusion and stenosis of bilateral carotid arteries: Secondary | ICD-10-CM

## 2019-12-20 DIAGNOSIS — Z6831 Body mass index (BMI) 31.0-31.9, adult: Secondary | ICD-10-CM | POA: Diagnosis not present

## 2019-12-20 DIAGNOSIS — Z79899 Other long term (current) drug therapy: Secondary | ICD-10-CM | POA: Diagnosis not present

## 2019-12-20 DIAGNOSIS — E1165 Type 2 diabetes mellitus with hyperglycemia: Secondary | ICD-10-CM | POA: Diagnosis not present

## 2019-12-20 DIAGNOSIS — J4540 Moderate persistent asthma, uncomplicated: Secondary | ICD-10-CM | POA: Diagnosis present

## 2019-12-20 DIAGNOSIS — E119 Type 2 diabetes mellitus without complications: Secondary | ICD-10-CM

## 2019-12-20 DIAGNOSIS — G7000 Myasthenia gravis without (acute) exacerbation: Secondary | ICD-10-CM | POA: Diagnosis not present

## 2019-12-20 DIAGNOSIS — G8191 Hemiplegia, unspecified affecting right dominant side: Secondary | ICD-10-CM | POA: Diagnosis present

## 2019-12-20 DIAGNOSIS — Z20822 Contact with and (suspected) exposure to covid-19: Secondary | ICD-10-CM | POA: Diagnosis not present

## 2019-12-20 DIAGNOSIS — Z8249 Family history of ischemic heart disease and other diseases of the circulatory system: Secondary | ICD-10-CM | POA: Diagnosis not present

## 2019-12-20 DIAGNOSIS — E669 Obesity, unspecified: Secondary | ICD-10-CM | POA: Diagnosis present

## 2019-12-20 DIAGNOSIS — I69351 Hemiplegia and hemiparesis following cerebral infarction affecting right dominant side: Secondary | ICD-10-CM | POA: Diagnosis not present

## 2019-12-20 DIAGNOSIS — H0520 Unspecified exophthalmos: Secondary | ICD-10-CM | POA: Diagnosis present

## 2019-12-20 DIAGNOSIS — R0602 Shortness of breath: Secondary | ICD-10-CM | POA: Diagnosis not present

## 2019-12-20 DIAGNOSIS — I2510 Atherosclerotic heart disease of native coronary artery without angina pectoris: Secondary | ICD-10-CM | POA: Diagnosis present

## 2019-12-20 DIAGNOSIS — Z823 Family history of stroke: Secondary | ICD-10-CM | POA: Diagnosis not present

## 2019-12-20 DIAGNOSIS — K219 Gastro-esophageal reflux disease without esophagitis: Secondary | ICD-10-CM | POA: Diagnosis not present

## 2019-12-20 DIAGNOSIS — E1140 Type 2 diabetes mellitus with diabetic neuropathy, unspecified: Secondary | ICD-10-CM | POA: Diagnosis present

## 2019-12-20 LAB — GLUCOSE, CAPILLARY: Glucose-Capillary: 273 mg/dL — ABNORMAL HIGH (ref 70–99)

## 2019-12-20 LAB — HIV ANTIBODY (ROUTINE TESTING W REFLEX): HIV Screen 4th Generation wRfx: NONREACTIVE

## 2019-12-20 LAB — APTT: aPTT: 30 seconds (ref 24–36)

## 2019-12-20 LAB — CBG MONITORING, ED
Glucose-Capillary: 289 mg/dL — ABNORMAL HIGH (ref 70–99)
Glucose-Capillary: 310 mg/dL — ABNORMAL HIGH (ref 70–99)

## 2019-12-20 LAB — RESPIRATORY PANEL BY RT PCR (FLU A&B, COVID)
Influenza A by PCR: NEGATIVE
Influenza B by PCR: NEGATIVE
SARS Coronavirus 2 by RT PCR: NEGATIVE

## 2019-12-20 LAB — HEMOGLOBIN A1C
Hgb A1c MFr Bld: 9.1 % — ABNORMAL HIGH (ref 4.8–5.6)
Mean Plasma Glucose: 214.47 mg/dL

## 2019-12-20 LAB — TSH: TSH: 0.991 u[IU]/mL (ref 0.350–4.500)

## 2019-12-20 MED ORDER — ACETAMINOPHEN 160 MG/5ML PO SOLN: 650.0000 mg | ORAL | Status: DC | PRN

## 2019-12-20 MED ORDER — ACETAMINOPHEN 325 MG PO TABS
650.0000 mg | ORAL_TABLET | ORAL | Status: DC | PRN
  Administered 2019-12-21 – 2019-12-26 (×5): 650 mg via ORAL
  Filled 2019-12-20 (×4): qty 2

## 2019-12-20 MED ORDER — SODIUM CHLORIDE 0.9% FLUSH
10.0000 mL | Freq: Two times a day (BID) | INTRAVENOUS | Status: DC
  Administered 2019-12-20 – 2019-12-24 (×7): 10 mL
  Administered 2019-12-24 – 2019-12-25 (×2): 20 mL
  Administered 2019-12-25: 10 mL
  Administered 2019-12-26: 20 mL
  Administered 2019-12-26 – 2019-12-27 (×2): 10 mL

## 2019-12-20 MED ORDER — INSULIN ASPART 100 UNIT/ML ~~LOC~~ SOLN
0.0000 [IU] | Freq: Every day | SUBCUTANEOUS | Status: DC
  Administered 2019-12-20: 3 [IU] via SUBCUTANEOUS
  Administered 2019-12-21: 5 [IU] via SUBCUTANEOUS
  Administered 2019-12-25: 3 [IU] via SUBCUTANEOUS
  Administered 2019-12-26: 2 [IU] via SUBCUTANEOUS

## 2019-12-20 MED ORDER — ASPIRIN 325 MG PO TABS
325.0000 mg | ORAL_TABLET | Freq: Every day | ORAL | Status: DC
  Administered 2019-12-20 – 2019-12-21 (×2): 325 mg via ORAL
  Filled 2019-12-20 (×2): qty 1

## 2019-12-20 MED ORDER — SENNOSIDES-DOCUSATE SODIUM 8.6-50 MG PO TABS
1.0000 | ORAL_TABLET | Freq: Every evening | ORAL | Status: DC | PRN
  Administered 2019-12-22 – 2019-12-24 (×2): 1 via ORAL
  Filled 2019-12-20 (×4): qty 1

## 2019-12-20 MED ORDER — STROKE: EARLY STAGES OF RECOVERY BOOK
Status: AC
  Filled 2019-12-20: qty 1

## 2019-12-20 MED ORDER — ENOXAPARIN SODIUM 40 MG/0.4ML ~~LOC~~ SOLN
40.0000 mg | SUBCUTANEOUS | Status: DC
  Administered 2019-12-21 – 2019-12-27 (×7): 40 mg via SUBCUTANEOUS
  Filled 2019-12-20 (×8): qty 0.4

## 2019-12-20 MED ORDER — ZOLPIDEM TARTRATE 5 MG PO TABS
5.0000 mg | ORAL_TABLET | Freq: Every evening | ORAL | Status: DC | PRN
  Administered 2019-12-20 – 2019-12-25 (×5): 5 mg via ORAL
  Filled 2019-12-20 (×6): qty 1

## 2019-12-20 MED ORDER — SPIRONOLACTONE 25 MG PO TABS
25.0000 mg | ORAL_TABLET | Freq: Every day | ORAL | Status: DC
  Filled 2019-12-20: qty 1

## 2019-12-20 MED ORDER — EZETIMIBE 10 MG PO TABS
10.0000 mg | ORAL_TABLET | Freq: Every day | ORAL | Status: DC
  Administered 2019-12-21 – 2019-12-27 (×7): 10 mg via ORAL
  Filled 2019-12-20 (×9): qty 1

## 2019-12-20 MED ORDER — INSULIN ASPART 100 UNIT/ML ~~LOC~~ SOLN
8.0000 [IU] | Freq: Once | SUBCUTANEOUS | Status: AC
  Administered 2019-12-20: 8 [IU] via SUBCUTANEOUS

## 2019-12-20 MED ORDER — IBUPROFEN 400 MG PO TABS
600.0000 mg | ORAL_TABLET | Freq: Once | ORAL | Status: DC
  Filled 2019-12-20: qty 1

## 2019-12-20 MED ORDER — MYCOPHENOLATE MOFETIL 250 MG PO CAPS
1500.0000 mg | ORAL_CAPSULE | Freq: Two times a day (BID) | ORAL | Status: DC
  Administered 2019-12-20 – 2019-12-27 (×14): 1500 mg via ORAL
  Filled 2019-12-20 (×15): qty 6

## 2019-12-20 MED ORDER — INSULIN PUMP
Freq: Three times a day (TID) | SUBCUTANEOUS | Status: DC
  Filled 2019-12-20: qty 1

## 2019-12-20 MED ORDER — ACETAMINOPHEN 650 MG RE SUPP: 650.0000 mg | RECTAL | Status: DC | PRN

## 2019-12-20 MED ORDER — INSULIN ASPART 100 UNIT/ML ~~LOC~~ SOLN
0.0000 [IU] | Freq: Three times a day (TID) | SUBCUTANEOUS | Status: DC
  Administered 2019-12-21: 9 [IU] via SUBCUTANEOUS
  Administered 2019-12-21: 7 [IU] via SUBCUTANEOUS

## 2019-12-20 MED ORDER — PREDNISONE 20 MG PO TABS
10.0000 mg | ORAL_TABLET | ORAL | Status: DC
  Administered 2019-12-21 – 2019-12-27 (×7): 10 mg via ORAL
  Filled 2019-12-20 (×7): qty 1

## 2019-12-20 MED ORDER — LEVOTHYROXINE SODIUM 137 MCG PO TABS
137.0000 ug | ORAL_TABLET | Freq: Every day | ORAL | Status: DC
  Administered 2019-12-21 – 2019-12-27 (×7): 137 ug via ORAL
  Filled 2019-12-20 (×8): qty 1

## 2019-12-20 MED ORDER — ACETAMINOPHEN 500 MG PO TABS
1000.0000 mg | ORAL_TABLET | Freq: Once | ORAL | Status: AC
  Administered 2019-12-20: 1000 mg via ORAL
  Filled 2019-12-20: qty 2

## 2019-12-20 MED ORDER — CYCLOSPORINE 0.05 % OP EMUL
1.0000 [drp] | Freq: Two times a day (BID) | OPHTHALMIC | Status: DC
  Administered 2019-12-20 – 2019-12-27 (×14): 1 [drp] via OPHTHALMIC
  Filled 2019-12-20 (×15): qty 1

## 2019-12-20 MED ORDER — IRBESARTAN 300 MG PO TABS: 150.0000 mg | ORAL_TABLET | Freq: Every day | ORAL | Status: DC

## 2019-12-20 MED ORDER — INSULIN ASPART 100 UNIT/ML ~~LOC~~ SOLN: 0.0000 [IU] | Freq: Every day | SUBCUTANEOUS | Status: DC

## 2019-12-20 MED ORDER — GADOBUTROL 1 MMOL/ML IV SOLN
8.0000 mL | Freq: Once | INTRAVENOUS | Status: AC | PRN
  Administered 2019-12-20: 8 mL via INTRAVENOUS

## 2019-12-20 MED ORDER — MOMETASONE FURO-FORMOTEROL FUM 200-5 MCG/ACT IN AERO
2.0000 | INHALATION_SPRAY | Freq: Two times a day (BID) | RESPIRATORY_TRACT | Status: DC
  Filled 2019-12-20: qty 8.8

## 2019-12-20 MED ORDER — INSULIN ASPART 100 UNIT/ML ~~LOC~~ SOLN: 0.0000 [IU] | Freq: Three times a day (TID) | SUBCUTANEOUS | Status: DC

## 2019-12-20 MED ORDER — SODIUM CHLORIDE 0.9 % IV SOLN: INTRAVENOUS | Status: DC

## 2019-12-20 MED ORDER — SIMVASTATIN 20 MG PO TABS: 40.0000 mg | ORAL_TABLET | Freq: Every day | ORAL | Status: DC

## 2019-12-20 MED ORDER — STROKE: EARLY STAGES OF RECOVERY BOOK
Freq: Once | Status: AC
  Filled 2019-12-20 (×2): qty 1

## 2019-12-20 MED ORDER — SODIUM CHLORIDE 0.9% FLUSH
10.0000 mL | INTRAVENOUS | Status: DC | PRN
  Administered 2019-12-21: 10 mL

## 2019-12-20 MED ORDER — ROSUVASTATIN CALCIUM 20 MG PO TABS
40.0000 mg | ORAL_TABLET | Freq: Every day | ORAL | Status: DC
  Administered 2019-12-20 – 2019-12-26 (×7): 40 mg via ORAL
  Filled 2019-12-20 (×7): qty 2

## 2019-12-20 MED ORDER — PANTOPRAZOLE SODIUM 40 MG PO TBEC
40.0000 mg | DELAYED_RELEASE_TABLET | Freq: Every day | ORAL | Status: DC
  Administered 2019-12-21 – 2019-12-27 (×7): 40 mg via ORAL
  Filled 2019-12-20 (×8): qty 1

## 2019-12-20 MED ORDER — TECHNETIUM TO 99M ALBUMIN AGGREGATED: 1.5500 | Freq: Once | INTRAVENOUS | Status: DC | PRN

## 2019-12-20 MED ORDER — MONTELUKAST SODIUM 10 MG PO TABS
10.0000 mg | ORAL_TABLET | ORAL | Status: DC
  Administered 2019-12-21 – 2019-12-27 (×7): 10 mg via ORAL
  Filled 2019-12-20 (×7): qty 1

## 2019-12-20 MED ORDER — ALBUTEROL SULFATE (2.5 MG/3ML) 0.083% IN NEBU: 2.5000 mg | INHALATION_SOLUTION | RESPIRATORY_TRACT | Status: DC | PRN

## 2019-12-20 MED ORDER — CHLORHEXIDINE GLUCONATE CLOTH 2 % EX PADS
6.0000 | MEDICATED_PAD | Freq: Every day | CUTANEOUS | Status: DC
  Administered 2019-12-21 – 2019-12-27 (×6): 6 via TOPICAL

## 2019-12-20 NOTE — ED Notes (Signed)
The pt reports that she has had rt face nyumbness since Monday and a visual cut for a long time

## 2019-12-20 NOTE — ED Notes (Signed)
Pt was brought lunch by  [facility]

## 2019-12-20 NOTE — ED Provider Notes (Signed)
Received patient at signout from [facility]. See previous provider notes for full H&P. Patient sent from [facility] for further evaluation of shortness of breath, chest pain, bilateral lower extremity edema.  She is pending VQ scan and DVT study in the morning.  She has anaphylaxis with contrast even with premedication so is not a candidate for CTA of the chest.  Plan for nuclear medicine team and vascular ultrasound to perform studies in the morning.  Vital signs are stable at this time.  6:56 AM  Signed out to oncoming provider PA [place].  Pending VQ scan and DVT study.  If work-up is reassuring she will be stable for discharge home with outpatient follow-up.    Renita Papa, PA-C 12/20/19 [id number]    Merryl Hacker, MD 12/20/19 [phone]

## 2019-12-20 NOTE — Progress Notes (Signed)
CPAP refused tonight.  Patient will have her relative bring her machine in for use tomorrow night.

## 2019-12-20 NOTE — Progress Notes (Signed)
Lower venous duplex       has been completed. Preliminary results can be found under CV proc through chart review. Mekenna Finau, BS, RDMS, RVT   

## 2019-12-20 NOTE — Consult Note (Addendum)
G1 recommend vascular surgery consult neurology Consultation  Reason for Consult: Stroke Referring Physician: Dr. Lorin Mercy  CC: Decreased sensation on the right aspect of her face  History is obtained from: Patient  HPI: [name] is a 69 y.o. female with past medical history of diabetes, thyroid cancer, myasthenia gravis, hypertension, hypercholesterolemia and asymptomatic carotid artery stenosis bilaterally.  Patient states that on Monday night she was last known normal at 10:30 PM.  She woke up later in the night feeling as though she was short of breath and had some chest pain.  She went back to sleep however woke up later that same night and noted that her right face felt thick and funny.  That day on Monday she did go to the ED to be evaluated however was then transferred on Tuesday morning due to needing a VQ scan as they were more concerned that she might of had a pulmonary embolism.  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  Patiently recently did have a carotid duplex that was ordered from her cardiologist.  Stenosis in the right internal carotid artery was greater than 70%, and to the right internal/common carotid artery ratio was consistent with greater than 70% stenosis.  Left internal carotid artery showed 50-69%.  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  Chart review (patient is [facility] by outpatient [facility] for her sleep apnea.)  LKW: 10:30 PM on 12/18/2019 tpa given?: no, out of window Premorbid modified Rankin scale (mRS): 0 NIH stroke score: 1   Past Medical History:  Diagnosis Date  . Arthritis    "all over my body" (03/13/2013)  . Asthma   . Asymptomatic carotid artery stenosis, bilateral 10/08/2018  . GERD (gastroesophageal reflux disease)   . H/O hiatal hernia   . Heart murmur   . Hypercholesterolemia 10/08/2018  . Hypertension    . Hypothyroidism   . Myasthenia gravis ([place])    "in my eyes; diagnsosed > 7 yr ago" (03/13/2013)  . Sleep apnea    on CPAP  . Thyroid carcinoma ([place])   . Type II diabetes mellitus (HCC)     Family History  Problem Relation Age of Onset  . Coronary artery disease Sister        s/p coronary stenting  . Stroke Sister 78  . Diabetes Sister   . Congestive Heart Failure Mother   . Asthma Mother   . Diabetes Mother   . Congestive Heart Failure Father   . Lung cancer Father   . Asthma Father   . Diabetes Father   . Diabetes Brother    Social History:   reports that she has never smoked. She has never used smokeless tobacco. She reports that she does not drink alcohol or use drugs.  Medications  Current Facility-Administered Medications:  .   stroke: mapping our early stages of recovery book, , Does not apply, Once, Karmen Bongo, MD .  0.9 %  sodium chloride infusion, , Intravenous, Continuous, Karmen Bongo, MD .  acetaminophen (TYLENOL) tablet 650 mg, 650 mg, Oral, Q4H PRN **OR** acetaminophen (TYLENOL) 160 MG/5ML solution 650 mg, 650 mg, Per Tube, Q4H PRN **OR** acetaminophen (TYLENOL) suppository 650 mg, 650 mg, Rectal, Q4H PRN, Karmen Bongo, MD .  albuterol (PROVENTIL) (5 MG/ML) 0.5% nebulizer solution 2.5 mg, 2.5 mg, Nebulization, Q4H PRN, Karmen Bongo, MD .  aspirin tablet 325 mg, 325 mg, Oral, Daily, Karmen Bongo, MD .  cycloSPORINE (RESTASIS) 0.05 % ophthalmic emulsion 1 drop, 1 drop, Both Eyes, BID, Karmen Bongo, MD .  enoxaparin (LOVENOX) injection 40 mg, 40 mg, Subcutaneous, Q24H, Karmen Bongo, MD .  ezetimibe (ZETIA) tablet 10 mg, 10 mg, Oral, Daily, Karmen Bongo, MD .  insulin pump, , Subcutaneous, TID WC, HS, 0200, Karmen Bongo, MD .  Derrill Memo ON 12/21/2019] Levothyroxine Sodium CAPS 137 mcg, 137 mcg, Oral, QAC breakfast, Karmen Bongo, MD .  mometasone-formoterol [facility]) 200-5 MCG/ACT inhaler 2 puff, 2 puff, Inhalation, BID, Karmen Bongo,  MD .  Derrill Memo ON 12/21/2019] montelukast (SINGULAIR) tablet 10 mg, 10 mg, Oral, Ledell Noss, MD .  mycophenolate (CELLCEPT) tablet 1,500 mg, 1,500 mg, Oral, BID, Karmen Bongo, MD .  pantoprazole (PROTONIX) EC tablet 40 mg, 40 mg, Oral, Daily, Karmen Bongo, MD, Stopped at 12/20/19 [phone] .  [START ON 12/21/2019] predniSONE (DELTASONE) tablet 10 mg, 10 mg, Oral, Ledell Noss, MD .  rosuvastatin (CRESTOR) tablet 40 mg, 40 mg, Oral, QHS, Karmen Bongo, MD .  senna-docusate (Senokot-S) tablet 1 tablet, 1 tablet, Oral, QHS PRN, Karmen Bongo, MD .  zolpidem (AMBIEN) tablet 5 mg, 5 mg, Oral, QHS PRN, Karmen Bongo, MD  Current Outpatient Medications:  .  Biotin 5000 MCG CAPS, Take 5,000 mcg by mouth daily with breakfast. , Disp: , Rfl:  .  BUTRANS 10 MCG/HR PTWK patch, Place 1 patch onto the skin See admin instructions. Place 1 patch onto the skin every 7 days as needed for pain, Disp: , Rfl:  .  BYDUREON BCISE 2 MG/0.85ML AUIJ, Inject 2 mg into the skin every Monday. , Disp: , Rfl:  .  canagliflozin (INVOKANA) 300 MG TABS tablet, Take 300 mg by mouth daily before breakfast., Disp: , Rfl:  .  Continuous Blood Gluc Receiver (FREESTYLE LIBRE 14 DAY READER) DEVI, 1 Device by Does not apply route as directed. , Disp: , Rfl:  .  Continuous Blood Gluc Sensor (FREESTYLE LIBRE 14 DAY SENSOR) MISC, Inject 1 patch into the skin every 14 (fourteen) days., Disp: , Rfl:  .  cycloSPORINE (RESTASIS) 0.05 % ophthalmic emulsion, Place 1 drop into both eyes 2 (two) times daily., Disp: , Rfl:  .  diclofenac sodium (VOLTAREN) 1 % GEL, Apply 2 g topically daily as needed (to painful sites). , Disp: , Rfl:  .  diphenhydrAMINE (BENADRYL) 25 mg capsule, Take 25 mg by mouth every 6 (six) hours as needed for allergies., Disp: , Rfl:  .  diphenhydrAMINE in sodium chloride 0.9 % 50 mL, Inject 50 mg into the vein as directed. 50 mg with all infusions, Disp: , Rfl:  .  esomeprazole (NEXIUM) 40 MG  capsule, Take 40 mg by mouth 2 (two) times daily., Disp: , Rfl:  .  ezetimibe (ZETIA) 10 MG tablet, Take 1 tablet (10 mg total) by mouth daily., Disp: 90 tablet, Rfl: 1 .  ibuprofen (ADVIL,MOTRIN) 800 MG tablet, Take 800 mg by mouth See admin instructions. Take 800 mg by mouth every 4-6 hours as needed for pain or headaches, Disp: , Rfl:  .  Immune Globulin, Human, 40 GM/400ML SOLN, Inject as directed See admin instructions. At home, for 2 days, every 6 weeks, Disp: , Rfl:  .  Insulin Disposable Pump (OMNIPOD DASH SYSTEM) KIT, continuous. , Disp: , Rfl:  .  insulin lispro (HUMALOG) 100 UNIT/ML injection, Inject into the skin See admin instructions. Per insulin pump (Omnipod Dash), Disp: , Rfl:  .  levalbuterol (XOPENEX) 0.63 MG/3ML nebulizer solution, Take 0.63 mg by nebulization every 6 (six) hours as needed for wheezing or shortness of breath. , Disp: , Rfl:  .  Levothyroxine Sodium 137 MCG CAPS, Take 137 mcg by mouth daily before breakfast. , Disp: , Rfl:  .  meclizine (ANTIVERT) 25 MG tablet, Take 25 mg by mouth 3 (three) times daily as needed for dizziness (or migraine-related nausea). , Disp: , Rfl:  .  Misc Natural Products (AIRBORNE ELDERBERRY) CHEW, Chew 1 tablet by mouth daily., Disp: , Rfl:  .  mometasone (NASONEX) 50 MCG/ACT nasal spray, Place 2 sprays into the nose daily as needed (for allergies). , Disp: , Rfl:  .  montelukast (SINGULAIR) 10 MG tablet, Take 10 mg by mouth every morning., Disp: , Rfl:  .  mycophenolate (CELLCEPT) 500 MG tablet, Take 1,500 mg by mouth in the morning and at bedtime. , Disp: , Rfl:  .  olmesartan (BENICAR) 20 MG tablet, Take 20 mg by mouth daily., Disp: , Rfl:  .  predniSONE (DELTASONE) 10 MG tablet, Take 10 mg by mouth every morning., Disp: , Rfl:  .  PRESCRIPTION MEDICATION, See admin instructions. CPAP- At bedtime, Disp: , Rfl:  .  PROAIR HFA 108 (90 BASE) MCG/ACT inhaler, Inhale 2 puffs into the lungs every 6 (six) hours as needed for wheezing or  shortness of breath. , Disp: , Rfl:  .  riTUXimab (RITUXAN) 500 MG/50ML injection, Inject 1,000 mg into the vein See admin instructions. 1,000 mg via IV, then another 1,000 mg in two weeks- repeat after 6 months: "riTUXimab (RITUXAN) 1,000 mg in sodium chloride 0.9% 1,000 mL infusion- Begin infusion at 50 mL/hr, if no hypersensitivity or infusion-related events occur, may increase by 50 mL/hr every 30 minutes to a maximum infusion rate of 400 mL/hr", Disp: , Rfl:  .  spironolactone (ALDACTONE) 25 MG tablet, Take 25 mg by mouth daily., Disp: , Rfl:  .  TRANSDERM-SCOP, 1.5 MG, 1 MG/3DAYS, Place 1 patch onto the skin every three (3) days as needed ("for migraine relief"). , Disp: , Rfl:  .  vitamin B-12 (CYANOCOBALAMIN) 1000 MCG tablet, Take 1,000 mcg by mouth daily., Disp: , Rfl:  .  Vitamin D, Ergocalciferol, (DRISDOL) 50000 UNITS CAPS capsule, Take 50,000 Units by mouth every Monday. , Disp: , Rfl:  .  zolpidem (AMBIEN) 5 MG tablet, Take 5 mg by mouth at bedtime as needed for sleep. , Disp: , Rfl:  .  B-D ULTRAFINE III SHORT PEN 31G X 8 MM MISC, , Disp: , Rfl:  .  rosuvastatin (CRESTOR) 40 MG tablet, Take 40 mg by mouth at bedtime., Disp: , Rfl:  .  simvastatin (ZOCOR) 40 MG tablet, Take 1 tablet (40 mg total) by mouth daily at 6 PM. (Patient not taking: Reported on 12/19/2019), Disp: 90 tablet, Rfl: 1 .  SYMBICORT 160-4.5 MCG/ACT inhaler, USE 2 INHALATIONS TWICE A DAY TO PREVENT COUGH OR WHEEZE. RINSE, GARGLE AND SPIT AFTER USE (Patient taking differently: Inhale 2 puffs into the lungs See admin instructions. ), Disp: 30.6 g, Rfl: 1  ROS: .   General ROS: negative for - chills, fatigue, fever, night sweats, weight gain or weight loss Ophthalmic ROS: negative for - blurry vision, double vision, eye pain or loss of vision ENT ROS: negative for - epistaxis, nasal discharge, oral lesions, sore throat, tinnitus or vertigo Respiratory ROS: Positive for - cshortness of breath  Cardiovascular ROS: Positive  for - chest pain Gastrointestinal ROS: negative for -  abdominal pain, diarrhea, hematemesis, nausea/vomiting or stool incontinence Genito-Urinary ROS: negative for - dysuria, hematuria, incontinence or urinary frequency/urgency Musculoskeletal ROS: negative for - joint swelling or muscular weakness Neurological ROS: as noted in HPI Dermatological ROS: negative for rash and skin lesion changes  Exam: Current vital signs: BP 123/70   Pulse (!) 109   Temp 98.8 F (37.1 C) (Oral)   Resp 14   Ht '5\' 3"'  (1.6 m)   Wt 81.6 kg   SpO2 96%   BMI 31.89 kg/m  Vital signs in last 24 hours: Temp:  [98.8 F (37.1 C)] 98.8 F (37.1 C) (04/28 1256) Pulse Rate:  [94-109] 109 (04/28 1450) Resp:  [13-26] 14 (04/28 1100) BP: (123-179)/(53-80) 123/70 (04/28 1450) SpO2:  [95 %-100 %] 96 % (04/28 1450)   Constitutional: Appears well-developed and well-nourished.  Eyes: No scleral injection, exophthalmos HENT: No OP obstrucion Head: Normocephalic.  Cardiovascular: Normal rate and regular rhythm.  Respiratory: Effort normal, non-labored breathing GI: Soft.  No distension. There is no tenderness.  Skin: WDI  Neuro: Mental Status: Patient is awake, alert, oriented to person, place, month, year, and situation. Speech-clear, intact naming, repeating and comprehension.  Able to follow commands with no difficulties.  Able to give a clear history. Cranial Nerves: II: Visual Fields are full.  III,IV, VI: EOMI without ptosis or diploplia. Pupils equal, round and reactive to light V: Facial sensation decreased on the right VII: Facial movement is symmetric.  VIII: hearing is intact to voice X: Palat elevates symmetrically XI: Shoulder shrug is symmetric. XII: tongue is midline without atrophy or fasciculations.  Motor: 4/5 strength with the right shoulder, right arm flexion and extension.  Otherwise 5/5 No drift Sensory: Sensation is symmetric to light touch and temperature in the arms and  legs. Deep Tendon Reflexes: Depressed throughout Plantars: Toes are downgoing bilaterally.  Cerebellar: FNF and HKS are intact bilaterally  Labs I have reviewed labs in epic and the results pertinent to this consultation are:   CBC    Component Value Date/Time   WBC 6.7 12/19/2019 1141   RBC 3.84 (L) 12/19/2019 1141   HGB 11.8 (L) 12/19/2019 1141   HCT 36.2 12/19/2019 1141   PLT 261 12/19/2019 1141   MCV 94.3 12/19/2019 1141   MCH 30.7 12/19/2019 1141   MCHC 32.6 12/19/2019 1141   RDW 13.1 12/19/2019 1141   LYMPHSABS 1.6 03/13/2013 1100   MONOABS 0.6 03/13/2013 1100   EOSABS 0.1 03/13/2013 1100   BASOSABS 0.1 03/13/2013 1100    CMP     Component Value Date/Time   NA 140 12/19/2019 1141   NA 143 05/31/2019 1101   K 3.3 (L) 12/19/2019 1141   CL 106 12/19/2019 1141   CO2 24 12/19/2019 1141   GLUCOSE 124 (H) 12/19/2019 1141   BUN 20 12/19/2019 1141   BUN 24 05/31/2019 1101   CREATININE 0.69 12/19/2019 1141   CALCIUM 10.1 12/19/2019 1141   PROT 7.8 12/19/2019 1141   PROT 6.5 05/31/2019 1101   ALBUMIN 3.8 12/19/2019 1141   ALBUMIN 4.0 05/31/2019 1101   AST 22 12/19/2019 1141   ALT 24 12/19/2019 1141   ALKPHOS 98 12/19/2019 1141   BILITOT 0.5 12/19/2019 1141   BILITOT 0.3 05/31/2019 1101   GFRNONAA >60 12/19/2019 1141   GFRAA >60 12/19/2019 1141    Lipid Panel     Component Value Date/Time   CHOL 243 (H) 04/14/2019 1018   TRIG 150 (H) 04/14/2019 1018   HDL 67 04/14/2019  1018   CHOLHDL 3.6 08/15/2015 0858   VLDL 32 (H) 08/15/2015 0858   LDLCALC 146 (H) 04/14/2019 1018   LDLDIRECT 147 (H) 05/31/2019 1101     Imaging I have reviewed the images obtained:  MRI examination of the brain-showing 7 mm infarct in the left thalamic region.  Etta Quill PA-C [facility] [phone]  M-F  (9:00 am- 5:00 PM)  12/20/2019, 4:10 PM     Assessment: This is a 69 year old female with multiple stroke risk factors.  Patient states that she has had  left facial numbness for the last 3 days along with shortness of breath and some chest pain.  During work-up for her ailments a MRI was obtained which did show a left thalamic infarct.  Although she does have bilateral carotid artery stenosis this would be a small vessel infarct and not related to patient's carotid artery stenosis.  Patient does not take aspirin and definitely needs a stroke work-up while in hospital.  Impression: -Asymptomatic Right Carotid Artery Stenosis  -Essential hypertension -Acute  left thalamic infarct -Right facial paresthesia  Recommend -MRA Head does not need repeat carotid artery Dopplers as this is just been done -Transthoracic Echo  -Given patient's risk factors patient would benefit from a loading dose of 300 mg Plavix today followed by 3 weeks of dual antiplatelet which includes 75 mg Plavix and 81 mg aspirin daily.  After the 3-week.  Patient would be then placed back on only ASA daily.  -Start or continue Atorvastatin 80 mg/other high intensity statin -BP goal: As patient has now had 3 days of symptoms blood pressure goal would be systolically less than 037 -HBAIC and Lipid profile -Telemetry monitoring -Frequent neuro checks -NPO until passes stroke swallow screen -PT/OT # please page stroke NP  Or  PA  Or MD from 8am -4 pm  as this patient from this time will be  followed by the stroke.   You can look them up on [email or web address]  Password [email or web address]  NEUROHOSPITALIST ADDENDUM Performed a face to face diagnostic evaluation.   I have reviewed the contents of history and physical exam as documented by PA/ARNP/Resident and agree with above documentation.  I have discussed and formulated the above plan as documented. Edits to the note have been made as needed.  Patient has left thalamic infarct-which is likely small vessel disease.  Outside the window for TPA. She does have bilateral carotid stenosis right greater than left -recommend vascular surgery  consult. Agree with dual antiplatelets for 3 weeks followed by aspirin daily.     Karena Addison Jahzeel Poythress MD [facility] [phone]   If 7pm to 7am, please call on call as listed on AMION.

## 2019-12-20 NOTE — ED Provider Notes (Signed)
In summary patient presented to [facility] with chest pain, and at around the same time she began experiencing numbness to the right side of the face and right arm.  Her evaluation and diagnostics were complicated by her severe contrast allergy.  Work-up has largely excluded pulmonary embolism, but here at [facility] she continues to exhibit neurological deficits.  MR of the brain confirms an acute thalamic stroke.  With chest pain and acute stroke, the concern for dissection persists.  Patient continues to have chest pain, though it is improved.  Discussed with Dr. Earleen Newport of radiology.  Will pursue MRA of the chest to exclude dissection.  Barth Kirks. Sedonia Small, [place] [email or web address]    Maudie Flakes, MD 12/20/19 1314

## 2019-12-20 NOTE — H&P (Addendum)
History and Physical    [name] ESP:[id number] DOB: 25-Jul-1951 DOA: 12/19/2019  PCP: [name], DO Consultants:  Einar Gip - cardiology; Chalmers Cater - endocrinology; [facility]; [facility] Patient coming from:  Home - lives alone; NOK: 6 sisters and brothers, baby sister, Frederich Chick, [phone]  Chief Complaint: CP; R hand/facial numbness  HPI: [name] is a 69 y.o. female with medical history significant of DM; myasthenia gravis; OSA on CPAP; thyroid CA; hypothyroidism; HTN; HLD; and carotid stenosis presenting with R face/arm numbness.  She woke up Monday night and went to the bathroom.  Her R face felt funny, she went to blow her nose and felt tingling.  She also couldn't make a fist with her right hand.  She also felt SOB and had CP.  She thought it was indigestion but it was no better Tuesday AM and so they went to the ER.  They went to [facility] and was sent to [facility].  Also with LE edema.  Her face was still numb this AM.  Her entire R face feels numb but moreso on R cheeks  Also with R hand numbness and weakness with decreased grip strength.  No dysphagia, dysarthria.  She is still having substernal CP with radiation under her left breast, but this is somewhat improved.  Mild ongoing SOB, particularly with exertion.  No cough.  She is on treatment for thyroid cancer.  She is on IVIG for 2 days every 6 weeks.  She has MG and is taking Rituxin every other week x  Weeks every 6 months.   ED Course:  Acute stroke - presented to [facility] yesterday with CP, R facial numbness.  Has anaphylaxis to contrast dye.  MRI shows 7 mm L thalamic stroke.  Has known carotid disease with recent doppler.  Still having some CP.  Dr. Lorraine Lax will consult.  CP with radiation to back - getting MRA.  Review of Systems: As per HPI; otherwise review of systems reviewed and negative.   Ambulatory Status:  Ambulates without assistance  COVID Vaccine Status:  None  Past Medical History:    Diagnosis Date  . Arthritis    "all over my body" (03/13/2013)  . Asthma   . Asymptomatic carotid artery stenosis, bilateral 10/08/2018  . GERD (gastroesophageal reflux disease)   . H/O hiatal hernia   . Heart murmur   . Hypercholesterolemia 10/08/2018  . Hypertension   . Hypothyroidism   . Myasthenia gravis ([place])    "in my eyes; diagnsosed > 7 yr ago" (03/13/2013)  . Sleep apnea    on CPAP  . Thyroid carcinoma ([place])   . Type II diabetes mellitus ([place])     Past Surgical History:  Procedure Laterality Date  . ABDOMINAL HYSTERECTOMY    . ANTERIOR CERVICAL DECOMP/DISCECTOMY FUSION     "I've had severa ORs; always went in from the front" (03/13/2013)  . APPENDECTOMY    . CARDIAC CATHETERIZATION     "several" (03/13/2013)  . CARPAL TUNNEL RELEASE Right   . CATARACT EXTRACTION W/ INTRAOCULAR LENS  IMPLANT, BILATERAL Bilateral   . CHOLECYSTECTOMY    . KNEE ARTHROSCOPY Left   . SHOULDER ARTHROSCOPY W/ ROTATOR CUFF REPAIR Left   . TONSILLECTOMY    . TOTAL THYROIDECTOMY      Social History   Socioeconomic History  . Marital status: Single    Spouse name: Not on file  . Number of children: 0  . Years of education: college  .  Highest education level: Not on file  Occupational History  . Occupation: retired  Tobacco Use  . Smoking status: Never Smoker  . Smokeless tobacco: Never Used  Substance and Sexual Activity  . Alcohol use: No    Alcohol/week: 0.0 standard drinks  . Drug use: No  . Sexual activity: Never  Other Topics Concern  . Not on file  Social History Narrative   Caffeine none.   Social Determinants of Health   Financial Resource Strain:   . Difficulty of Paying Living Expenses:   Food Insecurity:   . Worried About Charity fundraiser in the Last Year:   . Arboriculturist in the Last Year:   Transportation Needs:   . Film/video editor (Medical):   Marland Kitchen Lack of Transportation (Non-Medical):   Physical Activity:   . Days of Exercise per Week:   .  Minutes of Exercise per Session:   Stress:   . Feeling of Stress :   Social Connections:   . Frequency of Communication with Friends and Family:   . Frequency of Social Gatherings with Friends and Family:   . Attends Religious Services:   . Active Member of Clubs or Organizations:   . Attends Archivist Meetings:   Marland Kitchen Marital Status:   Intimate Partner Violence:   . Fear of Current or Ex-Partner:   . Emotionally Abused:   Marland Kitchen Physically Abused:   . Sexually Abused:     Allergies  Allergen Reactions  . Contrast Media [Iodinated Diagnostic Agents] Anaphylaxis    01-10-15---PT GIVEN 13 HR PRE MEDS FOR CT--PT TOLERATED IV CONTRAST W/O ANY REACTION------[name] RT-R CT  . Fluorescein Shortness Of Breath and Other (See Comments)    (Dye)  . Iodine Anaphylaxis    Contrast dye - iodine  . Molds & Smuts Shortness Of Breath and Other (See Comments)    Congestion and wheezing, also  . Shellfish-Derived Products Anaphylaxis, Shortness Of Breath, Swelling and Other (See Comments)    Welts, also  . Codeine Hives and Nausea Only  . Other Rash and Other (See Comments)    Coban causes welts, also    Family History  Problem Relation Age of Onset  . Coronary artery disease Sister        s/p coronary stenting  . Stroke Sister 44  . Diabetes Sister   . Congestive Heart Failure Mother   . Asthma Mother   . Diabetes Mother   . Congestive Heart Failure Father   . Lung cancer Father   . Asthma Father   . Diabetes Father   . Diabetes Brother     Prior to Admission medications   Medication Sig Start Date End Date Taking? Authorizing Provider  Biotin 5000 MCG CAPS Take 5,000 mcg by mouth daily with breakfast.    Yes [provider]  BUTRANS 10 MCG/HR PTWK patch Place 1 patch onto the skin See admin instructions. Place 1 patch onto the skin every 7 days as needed for pain 11/20/13  Yes [provider]  BYDUREON BCISE 2 MG/0.85ML AUIJ Inject 2 mg into the skin every  Monday.  08/21/19  Yes [provider]  canagliflozin (INVOKANA) 300 MG TABS tablet Take 300 mg by mouth daily before breakfast.   Yes [provider]  Continuous Blood Gluc Receiver (FREESTYLE LIBRE 14 DAY READER) DEVI 1 Device by Does not apply route as directed.    Yes [provider]  Continuous Blood Gluc Sensor (FREESTYLE [place]  14 DAY SENSOR) MISC Inject 1 patch into the skin every 14 (fourteen) days.   Yes [provider]  cycloSPORINE (RESTASIS) 0.05 % ophthalmic emulsion Place 1 drop into both eyes 2 (two) times daily.   Yes [provider]  diclofenac sodium (VOLTAREN) 1 % GEL Apply 2 g topically daily as needed (to painful sites).    Yes [provider]  diphenhydrAMINE (BENADRYL) 25 mg capsule Take 25 mg by mouth every 6 (six) hours as needed for allergies.   Yes [provider]  diphenhydrAMINE in sodium chloride 0.9 % 50 mL Inject 50 mg into the vein as directed. 50 mg with all infusions   Yes [provider]  esomeprazole (NEXIUM) 40 MG capsule Take 40 mg by mouth 2 (two) times daily.   Yes [provider]  ezetimibe (ZETIA) 10 MG tablet Take 1 tablet (10 mg total) by mouth daily. 06/22/19 12/19/19 Yes Adrian Prows, MD  ibuprofen (ADVIL,MOTRIN) 800 MG tablet Take 800 mg by mouth See admin instructions. Take 800 mg by mouth every 4-6 hours as needed for pain or headaches 04/10/10  Yes [provider]  Immune Globulin, Human, 40 GM/400ML SOLN Inject as directed See admin instructions. At home, for 2 days, every 6 weeks   Yes [provider]  Insulin Disposable Pump (OMNIPOD DASH SYSTEM) KIT continuous.    Yes [provider]  insulin lispro (HUMALOG) 100 UNIT/ML injection Inject into the skin See admin instructions. Per insulin pump (Omnipod Dash)   Yes [provider]  levalbuterol (XOPENEX) 0.63 MG/3ML nebulizer solution Take 0.63 mg by nebulization every 6 (six) hours as  needed for wheezing or shortness of breath.  03/07/14  Yes [provider]  Levothyroxine Sodium 137 MCG CAPS Take 137 mcg by mouth daily before breakfast.    Yes [provider]  meclizine (ANTIVERT) 25 MG tablet Take 25 mg by mouth 3 (three) times daily as needed for dizziness (or migraine-related nausea).  06/27/13  Yes [provider]  Misc Natural Products (AIRBORNE ELDERBERRY) CHEW Chew 1 tablet by mouth daily.   Yes [provider]  mometasone (NASONEX) 50 MCG/ACT nasal spray Place 2 sprays into the nose daily as needed (for allergies).    Yes [provider]  montelukast (SINGULAIR) 10 MG tablet Take 10 mg by mouth every morning.   Yes [provider]  mycophenolate (CELLCEPT) 500 MG tablet Take 1,500 mg by mouth in the morning and at bedtime.    Yes [provider]  olmesartan (BENICAR) 20 MG tablet Take 20 mg by mouth daily.   Yes [provider]  predniSONE (DELTASONE) 10 MG tablet Take 10 mg by mouth every morning.   Yes [provider]  PRESCRIPTION MEDICATION See admin instructions. CPAP- At bedtime   Yes [provider]  PROAIR HFA 108 (90 BASE) MCG/ACT inhaler Inhale 2 puffs into the lungs every 6 (six) hours as needed for wheezing or shortness of breath.  06/02/13  Yes [provider]  riTUXimab (RITUXAN) 500 MG/50ML injection Inject 1,000 mg into the vein See admin instructions. 1,000 mg via IV, then another 1,000 mg in two weeks- repeat after 6 months: "riTUXimab (RITUXAN) 1,000 mg in sodium chloride 0.9% 1,000 mL infusion- Begin infusion at 50 mL/hr, if no hypersensitivity or infusion-related events occur, may increase by 50 mL/hr every 30 minutes to a maximum infusion rate of 400 mL/hr"   Yes [provider]  spironolactone (ALDACTONE) 25 MG tablet Take  25 mg by mouth daily.   Yes [provider]  TRANSDERM-SCOP, 1.5 MG, 1 MG/3DAYS Place 1 patch onto the skin every  three (3) days as needed ("for migraine relief").  07/24/15  Yes [provider]  vitamin B-12 (CYANOCOBALAMIN) 1000 MCG tablet Take 1,000 mcg by mouth daily.   Yes [provider]  Vitamin D, Ergocalciferol, (DRISDOL) 50000 UNITS CAPS capsule Take 50,000 Units by mouth every Monday.  12/12/13  Yes [provider]  zolpidem (AMBIEN) 5 MG tablet Take 5 mg by mouth at bedtime as needed for sleep.  05/15/14  Yes [provider]  B-D ULTRAFINE III SHORT PEN 31G X 8 MM MISC  05/17/14   [provider]  rosuvastatin (CRESTOR) 40 MG tablet Take 40 mg by mouth at bedtime.    [provider]  simvastatin (ZOCOR) 40 MG tablet Take 1 tablet (40 mg total) by mouth daily at 6 PM. Patient not taking: Reported on 12/19/2019 06/22/19   Adrian Prows, MD  spironolactone-hydrochlorothiazide (ALDACTAZIDE) 25-25 MG tablet Take 1 tablet by mouth daily. Patient not taking: Reported on 12/19/2019 08/21/19   Adrian Prows, MD  SYMBICORT 160-4.5 MCG/ACT inhaler USE 2 INHALATIONS TWICE A DAY TO PREVENT COUGH OR WHEEZE. RINSE, GARGLE AND SPIT AFTER USE Patient taking differently: Inhale 2 puffs into the lungs See admin instructions.  07/12/15   Charlies Silvers, MD    Physical Exam: Vitals:   12/20/19 1030 12/20/19 1100 12/20/19 1256 12/20/19 1450  BP: (!) 145/64 (!) 154/68 (!) 163/57 123/70  Pulse: (!) 103 (!) 103 (!) 108 (!) 109  Resp: 16 14    Temp:   98.8 F (37.1 C)   TempSrc:   Oral   SpO2: 96% 96% 99% 96%  Weight:      Height:         . General:  Appears calm and comfortable and is NAD . Eyes:  PERRL, EOMI, normal lids, iris . ENT:  grossly normal hearing, lips & tongue, mmm; appropriate dentition . Neck:  no LAD, masses or thyromegaly; no carotid bruits . Cardiovascular:  RR with mild tachycardia, no m/r/g. No LE edema.  Marland Kitchen Respiratory:   CTA bilaterally with no wheezes/rales/rhonchi.  Normal respiratory effort. . Abdomen:  soft, NT, ND, NABS . Back:    normal alignment, no CVAT . Skin:  no rash or induration seen on limited exam . Musculoskeletal:  grossly normal tone BUE/BLE, good ROM, no bony abnormality . Psychiatric:  grossly normal mood and affect, speech fluent and appropriate, AOx3 . Neurologic:  CN 2-12 grossly intact, moves all extremities in coordinated fashion    Radiological Exams on Admission: DG Chest 2 View  Result Date: 12/19/2019 CLINICAL DATA:  Left-sided chest pain EXAM: CHEST - 2 VIEW COMPARISON:  06/14/2017 FINDINGS: Right-sided central venous port tip over the proximal right atrium. Low lung volumes. No consolidation, pleural effusion or pneumothorax. Borderline to mild cardiomegaly. Aortic atherosclerosis. No pneumothorax. IMPRESSION: Low lung volumes. Borderline to mild cardiomegaly. Electronically Signed   By: Donavan Foil M.D.   On: 12/19/2019 17:20   MR BRAIN WO CONTRAST  Result Date: 12/20/2019 CLINICAL DATA:  Neuro deficit, acute, stroke suspected. Additional history provided: Right facial numbness and right arm weakness, known history of carotid disease. EXAM: MRI HEAD WITHOUT CONTRAST TECHNIQUE: Multiplanar, multiecho pulse sequences of the brain and surrounding structures were obtained without intravenous contrast. COMPARISON:  Report from head CT 05/24/2000 (images unavailable). FINDINGS: Brain: There is a 7 mm focus of  restricted diffusion within the left thalamus consistent with acute lacunar infarct (series 5, image 30). Corresponding T2/FLAIR hyperintensity at this site. No acute infarct is identified elsewhere within the brain. No significant white matter disease for age. Cerebral volume is normal. No evidence of intracranial mass. No chronic intracranial blood products. No extra-axial fluid collection. No midline shift. Vascular: Expected proximal arterial flow voids. Skull and upper cervical spine: No focal marrow lesion. Sequela of prior cervical spine fusion with susceptibility artifact arising from spinal  fusion hardware. Adjacent level disease at C2-C3 with a posterior disc osteophyte complex and ligamentum flavum hypertrophy contributing to suspected at least mild/moderate spinal canal stenosis. Sinuses/Orbits: Visualized orbits show no acute finding. Right maxillary sinus mucous retention cyst. Mild ethmoid and left sphenoid sinus mucosal thickening. Trace fluid within the bilateral mastoid air cells. 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 Otherwise unremarkable MRI appearance of the brain for age. Paranasal sinus disease as described. Trace bilateral mastoid effusions. Sequela of prior cervical fusion. C2-C3 adjacent level disease with suspected at least mild/moderate spinal canal stenosis. Electronically Signed   By: Kellie Simmering DO   On: 12/20/2019 12:56   NM Pulmonary Perfusion  Result Date: 12/20/2019 CLINICAL DATA:  Left side chest pain, shortness of breath EXAM: NUCLEAR MEDICINE PERFUSION LUNG SCAN TECHNIQUE: Perfusion images were obtained in multiple projections after intravenous injection of radiopharmaceutical. Ventilation scans intentionally deferred if perfusion scan and chest x-ray adequate for interpretation during COVID 19 epidemic. RADIOPHARMACEUTICALS:  1.55 mCi Tc-29mMAA IV COMPARISON:  Chest x-ray 12/19/2019 FINDINGS: No perfusion defects seen to suggest pulmonary embolus. IMPRESSION: No evidence of pulmonary embolus. Electronically Signed   By: KRolm BaptiseM.D.   On: 12/20/2019 08:16   MR ANGIO CHEST W WO CONTRAST  Result Date: 12/20/2019 CLINICAL DATA:  69year old female with severe contrast allergy and acute chest pain concerning for possible dissection. EXAM: MRA CHEST WITH OR WITHOUT CONTRAST TECHNIQUE: Angiographic images of the chest were obtained using MRA technique with intravenous contrast. CONTRAST:  853mGADAVIST GADOBUTROL 1 MMOL/ML IV SOLN COMPARISON:  None. FINDINGS: VASCULAR Aorta: Conventional 3 vessel arch anatomy. No evidence of dissection, aneurysm  or acute thoracic aortic abnormality. Mildly irregular atherosclerotic plaque present in the descending thoracic aorta. No penetrating ulceration. Signal void in the proximal left subclavian artery over approximately 2 cm may represent an intermediate segment occlusion. Heart: The heart is normal in size. However, there is significant concentric hypertrophy of the ventricular myocardium. Pulmonary Arteries:  Normal in caliber.  No large PE. Other: Right IJ approach single-lumen power injectable port catheter is present. The catheter tip terminates at the superior cavoatrial junction. NON-VASCULAR Mediastinum: No mediastinal mass or adenopathy. Lungs/pleura. No focal signal abnormality or abnormal enhancement. No evidence of pleural effusion. Upper abdomen: 1.8 cm simple cyst in the upper pole of the right kidney. No acute abnormality within the upper abdomen. Musculoskeletal: No focal signal abnormality or abnormal enhancement. IMPRESSION: VASCULAR 1. No evidence of acute aortic dissection, aortic aneurysm or other acute vascular abnormality. 2. 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 3. Heterogeneous and irregular/ulcerated atherosclerotic plaque along the descending thoracic aorta. 4. Concentric hypertrophy of the left ventricular myocardium suggests chronic systolic hypertension. NON-VASCULAR 1. No acute abnormality within the visualized upper abdomen. 2. Simple right upper pole renal cyst. Electronically Signed   By: HeJacqulynn Cadet.D.   On: 12/20/2019 15:01   VAS USK[place]OWER EXTREMITY VENOUS (DVT) (ONLY  [facility])  Result Date: 12/20/2019  Lower Venous DVTStudy Indications: Edema.  Comparison Study: no prior Performing Technologist: June Leap RDMS, RVT  Examination Guidelines: A complete evaluation includes B-mode imaging, spectral Doppler, color Doppler, and power Doppler as needed of all accessible portions of each vessel.  Bilateral testing is considered an integral part of a complete examination. Limited examinations for reoccurring indications may be performed as noted. The reflux portion of the exam is performed with the patient in reverse Trendelenburg.  +---------+---------------+---------+-----------+----------+--------------+ RIGHT    CompressibilityPhasicitySpontaneityPropertiesThrombus Aging +---------+---------------+---------+-----------+----------+--------------+ CFV      Full           Yes      Yes                                 +---------+---------------+---------+-----------+----------+--------------+ SFJ      Full                                                        +---------+---------------+---------+-----------+----------+--------------+ FV Prox  Full                                                        +---------+---------------+---------+-----------+----------+--------------+ FV Mid   Full                                                        +---------+---------------+---------+-----------+----------+--------------+ FV DistalFull                                                        +---------+---------------+---------+-----------+----------+--------------+ PFV      Full                                                        +---------+---------------+---------+-----------+----------+--------------+ POP      Full           Yes      Yes                                 +---------+---------------+---------+-----------+----------+--------------+ PTV      Full                                                        +---------+---------------+---------+-----------+----------+--------------+ PERO     Full                                                        +---------+---------------+---------+-----------+----------+--------------+   +---------+---------------+---------+-----------+----------+--------------+  LEFT      CompressibilityPhasicitySpontaneityPropertiesThrombus Aging +---------+---------------+---------+-----------+----------+--------------+ CFV      Full           Yes      Yes                                 +---------+---------------+---------+-----------+----------+--------------+ SFJ      Full                                                        +---------+---------------+---------+-----------+----------+--------------+ FV Prox  Full                                                        +---------+---------------+---------+-----------+----------+--------------+ FV Mid   Full                                                        +---------+---------------+---------+-----------+----------+--------------+ FV DistalFull                                                        +---------+---------------+---------+-----------+----------+--------------+ PFV      Full                                                        +---------+---------------+---------+-----------+----------+--------------+ POP      Full           Yes      Yes                                 +---------+---------------+---------+-----------+----------+--------------+ PTV      Full                                                        +---------+---------------+---------+-----------+----------+--------------+ PERO     Full                                                        +---------+---------------+---------+-----------+----------+--------------+     Summary: RIGHT: - There is no evidence of deep vein thrombosis in the lower extremity.  - No cystic structure found in the popliteal fossa.  LEFT: - There is no evidence of deep vein thrombosis in the lower extremity.  - No  cystic structure found in the popliteal fossa.  *See table(s) above for measurements and observations.    Preliminary     EKG: Independently reviewed.  Sinus tachycardia with rate 105; nonspecific ST changes  with no evidence of acute ischemia   Labs on Admission: I have personally reviewed the available labs and imaging studies at the time of the admission.  Pertinent labs:   K+ 3.3 Glucose 124 BNP 21.5 HS troponin 3, 4 WBC 6.7 Hgb 11.8 D-dimer 1.01   Assessment/Plan Principal Problem:   Thalamic stroke (HCC) Active Problems:   Myasthenia gravis ([place])   Essential hypertension, benign   DM (diabetes mellitus) type II uncontrolled with eye manifestation (HCC)   OSA (obstructive sleep apnea)   Cancer of thyroid (HCC)   Asymptomatic carotid artery stenosis, bilateral   Hypercholesterolemia   CVA -Patient presented [facility] to [facility] with c/o R face and hand tingling/numbness and R hand weakness -She has known carotid stenosis with recent carotid dopplers -Concerning for CVA -MRI confirms L thalamic CVA -Will admit for further CVA evaluation -Telemetry monitoring -Echo -Risk stratification with FLP, A1c; will also check TSH and UDS -ASA daily -Neurology consult -PT/OT/ST/Nutrition Consults  Asymptomatic B carotid stenosis -4/19 dopplers with R ICA stenosis >70% and L 50-69% -Current CVA is in L thalamus, indicating probable contribution from L ICA -Vascular surgery consult to determine if intervention is needed and whether it would be appropriate during or after hospitalization  HTN -Allow permissive HTN for now -Treat BP only if >220/120, and then with goal of 15% reduction -Hold aldactone, Benicar and plan to restart in 48-72 hours   HLD -Check FLP -Continue Crestor; stop Zocor due to limited utility with > 1 statin -Continue Zetia   DM -Check A1c -Hold home PO medications (Bydureon, Invokana) -Continue insulin pump  Chest pain -Low suspicion for ACS -She clearly had reproducible pain on exam -Negative troponin x 2 -Unremarkable EKG -Negative MRA for dissection -No further evaluation at this time  OSA on CPAP -Continue CPAP  Myasthenia  gravis -Continue Cellcept -She also receives routine injections of Rituxan -Continue prednisone daily  H/o thyroid cancer, now hypothyroidism -s/p thyroidectomy -Continue Synthroid -Recheck TSH - she appeared to be oversuppressed in 05/2019 at last check  COPD -Continue Symbicort, Singulair, Albuterol   Body mass index is 31.89 kg/m.  Note: This patient has been tested and is pending for the novel coronavirus COVID-19.      DVT prophylaxis:  Lovenox  Code Status: Full - confirmed with patient Family Communication: None present; she did not ask me to call her sister Disposition Plan:  The patient is from: home  Anticipated d/c is to: home, possibly with [facility] services depending on therapy recommendations.  Anticipated d/c date will depend on clinical response to treatment, likely 2-3 days  Patient is currently: acutely ill Consults called: Neurology; Vascular surgery; PT/OT/ST/Nutrition  Admission status: Admit - It is my clinical opinion that admission to [facility] is reasonable and necessary because of the expectation that this patient will require hospital care that crosses at least 2 midnights to treat this condition based on the medical complexity of the problems presented.  Given the aforementioned information, the predictability of an adverse outcome is felt to be significant.    Karmen Bongo MD [facility]   How to contact the [facility] Attending or Consulting provider [place] or covering provider during after hours [place], for this patient?  1. Check the care team in [facility] and  look for a) attending/consulting [facility] provider listed and b) the [facility] team listed 2. Log into [email or web address] and use [place]'s universal password to access. If you do not have the password, please contact the hospital operator. 3. Locate the [facility] provider you are looking for under [facility] and page to a number that you can be directly reached. 4. If you still have difficulty reaching the  provider, please page the [facility] (Director on Call) for the Hospitalists listed on amion for assistance.   12/20/2019, 3:28 PM

## 2019-12-20 NOTE — ED Provider Notes (Signed)
9:58 AM Patient signed out from me by [facility].   In brief patient developed chest pain early yesterday morning.  She was evaluated for this at [facility] where she was found to have an elevated D-dimer.  She required VQ scan due to inability to receive IV contrast.  Troponins were negative x2.  She was transferred to [facility] where she had to wait in the emergency department overnight for lower extremity Dopplers as well as VQ scan.  VQ scan was negative and lower extremity Dopplers without signs of clot.  I went and updated the patient on her results.  She states that in addition to her chest pain she developed acute onset of right sided facial numbness.  No facial weakness or facial droop.  She is also had right upper extremity weakness.  No lower extremity symptoms reported.  Patient also notes that she had recent carotid Doppler studies ordered by Dr. Einar Gip which in epic demonstrates greater than 70% stenosis on the right which is progressive as well as 50 to 70% stenosis on the left.  BP (!) 162/72   Pulse (!) 102   Temp 98.1 F (36.7 C) (Oral)   Resp 18   Ht 5\' 3"  (1.6 m)   Wt 81.6 kg   SpO2 99%   BMI 31.89 kg/m    10:03 AM Patient discussed with and seen by Dr. Sedonia Small. MRI brain ordered to evaluate for stroke. We considered dissection causing neuro deficit for which evaluation would be difficult. But with improving chest pain, have low suspicion for this.   1:42 PM MRI performed. Demonstrated 75mm L thalamic CVA.   I spoke with Dr. Lorraine Lax who will consult. Dr. Sedonia Small has spoken with radiology regarding test to eval for aortic dissection -- MRA ordered.   Will discuss with hospitalist for admission.   BP (!) 163/57 (BP Location: Left Arm)   Pulse (!) 108   Temp 98.8 F (37.1 C) (Oral)   Resp 14   Ht 5\' 3"  (1.6 m)   Wt 81.6 kg   SpO2 99%   BMI 31.89 kg/m   2:05 PM Spoke with Dr. Lorin Mercy who will consult for admission. Patient getting MRA to r/o aortic dissection.    Carlisle Cater, PA-C 12/20/19 1405    Maudie Flakes, MD 12/26/19 [phone]

## 2019-12-20 NOTE — ED Notes (Signed)
The pt reports that she has chest pain intermittently all the time

## 2019-12-20 NOTE — ED Notes (Signed)
Pt complaining of 6/10 chest pain and face numbness. Pt transported to nuc med

## 2019-12-20 NOTE — Consult Note (Addendum)
Hospital Consult    Reason for Consult: Carotid stenosis Requesting Physician: ED MRN #:  [id number]  History of Present Illness: This is a 69 y.o. female who woke up Monday morning complaining of right facial numbness.  She did not have difficult nature difficulty swallowing.  There was no sudden vision loss or extremity weakness.  Yesterday she developed anterior chest pain and shortness of breath.  She is complaining of right hand weakness.  Because of this we are asked to evaluate her with her history of bilateral carotid artery stenosis.  Brain imaging reveals a left thalamic stroke.  Duplex carotid ultrasound reveals greater than 70% right ICA stenosis and 50 to 70% left ICA stenosis.  She is interviewed and examined in the emergency department where she is awake, alert and in no apparent distress.  She continues to complain of anterior chest pain.  She has no prior history of stroke or TIA.  Her family history is significant for stroke in her sister and she is unaware of the etiology.  Her past medical history is significant for hypertension, hypercholesterolemia and diabetes.  Her carotid artery stenosis is followed by Dr. Einar Gip.  She is maintained on statin and has been told she will be prescribed aspirin.  She has no prior history of vascular or cardiac intervention.  Past Medical History:  Diagnosis Date  . Arthritis    "all over my body" (03/13/2013)  . Asthma   . Asymptomatic carotid artery stenosis, bilateral 10/08/2018  . GERD (gastroesophageal reflux disease)   . H/O hiatal hernia   . Heart murmur   . Hypercholesterolemia 10/08/2018  . Hypertension   . Hypothyroidism   . Myasthenia gravis ([place])    "in my eyes; diagnsosed > 7 yr ago" (03/13/2013)  . Sleep apnea    on CPAP  . Thyroid carcinoma ([place])   . Type II diabetes mellitus ([place])     Past Surgical History:  Procedure Laterality Date  . ABDOMINAL HYSTERECTOMY    . ANTERIOR CERVICAL DECOMP/DISCECTOMY FUSION     "I've had severa ORs; always went in from the front" (03/13/2013)  . APPENDECTOMY    . CARDIAC CATHETERIZATION     "several" (03/13/2013)  . CARPAL TUNNEL RELEASE Right   . CATARACT EXTRACTION W/ INTRAOCULAR LENS  IMPLANT, BILATERAL Bilateral   . CHOLECYSTECTOMY    . KNEE ARTHROSCOPY Left   . SHOULDER ARTHROSCOPY W/ ROTATOR CUFF REPAIR Left   . TONSILLECTOMY    . TOTAL THYROIDECTOMY      Allergies  Allergen Reactions  . Contrast Media [Iodinated Diagnostic Agents] Anaphylaxis    01-10-15---PT GIVEN 13 HR PRE MEDS FOR CT--PT TOLERATED IV CONTRAST W/O ANY REACTION------[name] RT-R CT  . Fluorescein Shortness Of Breath and Other (See Comments)    (Dye)  . Iodine Anaphylaxis    Contrast dye - iodine  . Molds & Smuts Shortness Of Breath and Other (See Comments)    Congestion and wheezing, also  . Shellfish-Derived Products Anaphylaxis, Shortness Of Breath, Swelling and Other (See Comments)    Welts, also  . Codeine Hives and Nausea Only  . Other Rash and Other (See Comments)    Coban causes welts, also    Prior to Admission medications   Medication Sig Start Date End Date Taking? Authorizing Provider  Biotin 5000 MCG CAPS Take 5,000 mcg by mouth daily with breakfast.    Yes [provider]  BUTRANS 10 MCG/HR PTWK patch Place 1 patch onto the skin See admin instructions.  Place 1 patch onto the skin every 7 days as needed for pain 11/20/13  Yes [provider]  BYDUREON BCISE 2 MG/0.85ML AUIJ Inject 2 mg into the skin every Monday.  08/21/19  Yes [provider]  canagliflozin (INVOKANA) 300 MG TABS tablet Take 300 mg by mouth daily before breakfast.   Yes [provider]  Continuous Blood Gluc Receiver (FREESTYLE LIBRE 14 DAY READER) DEVI 1 Device by Does not apply route as directed.    Yes [provider]  Continuous Blood Gluc Sensor (FREESTYLE LIBRE 14 DAY SENSOR) MISC Inject 1 patch into the skin every 14 (fourteen) days.   Yes  [provider]  cycloSPORINE (RESTASIS) 0.05 % ophthalmic emulsion Place 1 drop into both eyes 2 (two) times daily.   Yes [provider]  diclofenac sodium (VOLTAREN) 1 % GEL Apply 2 g topically daily as needed (to painful sites).    Yes [provider]  diphenhydrAMINE (BENADRYL) 25 mg capsule Take 25 mg by mouth every 6 (six) hours as needed for allergies.   Yes [provider]  diphenhydrAMINE in sodium chloride 0.9 % 50 mL Inject 50 mg into the vein as directed. 50 mg with all infusions   Yes [provider]  esomeprazole (NEXIUM) 40 MG capsule Take 40 mg by mouth 2 (two) times daily.   Yes [provider]  ezetimibe (ZETIA) 10 MG tablet Take 1 tablet (10 mg total) by mouth daily. 06/22/19 12/19/19 Yes Adrian Prows, MD  ibuprofen (ADVIL,MOTRIN) 800 MG tablet Take 800 mg by mouth See admin instructions. Take 800 mg by mouth every 4-6 hours as needed for pain or headaches 04/10/10  Yes [provider]  Immune Globulin, Human, 40 GM/400ML SOLN Inject as directed See admin instructions. At home, for 2 days, every 6 weeks   Yes [provider]  Insulin Disposable Pump (OMNIPOD DASH SYSTEM) KIT continuous.    Yes [provider]  insulin lispro (HUMALOG) 100 UNIT/ML injection Inject into the skin See admin instructions. Per insulin pump (Omnipod Dash)   Yes [provider]  levalbuterol (XOPENEX) 0.63 MG/3ML nebulizer solution Take 0.63 mg by nebulization every 6 (six) hours as needed for wheezing or shortness of breath.  03/07/14  Yes [provider]  Levothyroxine Sodium 137 MCG CAPS Take 137 mcg by mouth daily before breakfast.    Yes [provider]  meclizine (ANTIVERT) 25 MG tablet Take 25 mg by mouth 3 (three) times daily as needed for dizziness (or migraine-related nausea).  06/27/13  Yes [provider]  Misc Natural Products (AIRBORNE ELDERBERRY) CHEW Chew 1 tablet by mouth daily.    Yes [provider]  mometasone (NASONEX) 50 MCG/ACT nasal spray Place 2 sprays into the nose daily as needed (for allergies).    Yes [provider]  montelukast (SINGULAIR) 10 MG tablet Take 10 mg by mouth every morning.   Yes [provider]  mycophenolate (CELLCEPT) 500 MG tablet Take 1,500 mg by mouth in the morning and at bedtime.    Yes [provider]  olmesartan (BENICAR) 20 MG tablet Take 20 mg by mouth daily.   Yes [provider]  predniSONE (DELTASONE) 10 MG tablet Take 10 mg by mouth every morning.   Yes [provider]  PRESCRIPTION MEDICATION See admin instructions. CPAP- At bedtime   Yes [provider]  PROAIR HFA 108 (90 BASE) MCG/ACT inhaler Inhale 2 puffs into the lungs every 6 (six) hours as  needed for wheezing or shortness of breath.  06/02/13  Yes [provider]  riTUXimab (RITUXAN) 500 MG/50ML injection Inject 1,000 mg into the vein See admin instructions. 1,000 mg via IV, then another 1,000 mg in two weeks- repeat after 6 months: "riTUXimab (RITUXAN) 1,000 mg in sodium chloride 0.9% 1,000 mL infusion- Begin infusion at 50 mL/hr, if no hypersensitivity or infusion-related events occur, may increase by 50 mL/hr every 30 minutes to a maximum infusion rate of 400 mL/hr"   Yes [provider]  spironolactone (ALDACTONE) 25 MG tablet Take 25 mg by mouth daily.   Yes [provider]  TRANSDERM-SCOP, 1.5 MG, 1 MG/3DAYS Place 1 patch onto the skin every three (3) days as needed ("for migraine relief").  07/24/15  Yes [provider]  vitamin B-12 (CYANOCOBALAMIN) 1000 MCG tablet Take 1,000 mcg by mouth daily.   Yes [provider]  Vitamin D, Ergocalciferol, (DRISDOL) 50000 UNITS CAPS capsule Take 50,000 Units by mouth every Monday.  12/12/13  Yes [provider]  zolpidem (AMBIEN) 5 MG tablet Take 5 mg by mouth at bedtime as needed for sleep.  05/15/14  Yes [provider]  B-D ULTRAFINE III SHORT PEN 31G X 8 MM MISC  05/17/14   [provider]  rosuvastatin (CRESTOR) 40 MG tablet Take 40 mg by mouth at bedtime.    [provider]  simvastatin (ZOCOR) 40 MG tablet Take 1 tablet (40 mg total) by mouth daily at 6 PM. Patient not taking: Reported on 12/19/2019 06/22/19   Adrian Prows, MD  SYMBICORT 160-4.5 MCG/ACT inhaler USE 2 INHALATIONS TWICE A DAY TO PREVENT COUGH OR WHEEZE. RINSE, GARGLE AND SPIT AFTER USE Patient taking differently: Inhale 2 puffs into the lungs See admin instructions.  07/12/15   Charlies Silvers, MD    Social History   Socioeconomic History  . Marital status: Single    Spouse name: Not on file  . Number of children: 0  . Years of education: college  . Highest education level: Not on file  Occupational History  . Occupation: retired  Tobacco Use  . Smoking status: Never Smoker  . Smokeless tobacco: Never Used  Substance and Sexual Activity  . Alcohol use: No    Alcohol/week: 0.0 standard drinks  . Drug use: No  . Sexual activity: Never  Other Topics Concern  . Not on file  Social History Narrative   Caffeine none.   Social Determinants of Health   Financial Resource Strain:   . Difficulty of Paying Living Expenses:   Food Insecurity:   . Worried About Charity fundraiser in the Last Year:   . Arboriculturist in the Last Year:   Transportation Needs:   . Film/video editor (Medical):   Marland Kitchen Lack of Transportation (Non-Medical):   Physical Activity:   . Days of Exercise per Week:   . Minutes of Exercise per Session:   Stress:   . Feeling of Stress :   Social Connections:   . Frequency of Communication with Friends and Family:   . Frequency of Social Gatherings with Friends and Family:   . Attends Religious Services:   . Active Member of Clubs or Organizations:   . Attends Archivist Meetings:   Marland Kitchen Marital Status:   Intimate Partner Violence:   . Fear of Current or  Ex-Partner:   . Emotionally Abused:   Marland Kitchen Physically Abused:   . Sexually Abused:  Family History  Problem Relation Age of Onset  . Coronary artery disease Sister        s/p coronary stenting  . Stroke Sister 65  . Diabetes Sister   . Congestive Heart Failure Mother   . Asthma Mother   . Diabetes Mother   . Congestive Heart Failure Father   . Lung cancer Father   . Asthma Father   . Diabetes Father   . Diabetes Brother     ROS: Otherwise negative unless mentioned in HPI  Physical Examination  Vitals:   12/20/19 1450 12/20/19 1654  BP: 123/70   Pulse: (!) 109 (!) 104  Resp:    Temp:  98 F (36.7 C)  SpO2: 96% 98%   Body mass index is 31.89 kg/m.  General:  WDWN in NAD Gait: Not observed HENT: WNL, normocephalic Pulmonary: normal non-labored breathing, without Rales, rhonchi,  wheezing Cardiac: regular, with systolic murmur without carotid bruits Abdomen:  soft, NT/ND, no masses Skin: without rashes Vascular Exam/Pulses: Patient has palpable 2+ brachial and radial pulses.  She has palpable 2+ pedal pulses. Extremities: without ischemic changes, without Gangrene , without cellulitis; without open wounds; 5/ 5 bilateral hand grip strength.  5/5 tricep and bicep strength bilaterally.  5/5 bilateral lower extremity dorsiflexion Musculoskeletal: no muscle wasting or atrophy  Neurologic: A&O X 3;  No focal weakness or paresthesias are detected; speech is fluent/normal.  Tongue is midline and face is symmetrical Psychiatric:  The pt has Normal affect. Lymph:  Unremarkable  CBC    Component Value Date/Time   WBC 6.7 12/19/2019 1141   RBC 3.84 (L) 12/19/2019 1141   HGB 11.8 (L) 12/19/2019 1141   HCT 36.2 12/19/2019 1141   PLT 261 12/19/2019 1141   MCV 94.3 12/19/2019 1141   MCH 30.7 12/19/2019 1141   MCHC 32.6 12/19/2019 1141   RDW 13.1 12/19/2019 1141   LYMPHSABS 1.6 03/13/2013 1100   MONOABS 0.6 03/13/2013 1100   EOSABS 0.1 03/13/2013 1100   BASOSABS 0.1  03/13/2013 1100    BMET    Component Value Date/Time   NA 140 12/19/2019 1141   NA 143 05/31/2019 1101   K 3.3 (L) 12/19/2019 1141   CL 106 12/19/2019 1141   CO2 24 12/19/2019 1141   GLUCOSE 124 (H) 12/19/2019 1141   BUN 20 12/19/2019 1141   BUN 24 05/31/2019 1101   CREATININE 0.69 12/19/2019 1141   CALCIUM 10.1 12/19/2019 1141   GFRNONAA >60 12/19/2019 1141   GFRAA >60 12/19/2019 1141    COAGS: Lab Results  Component Value Date   INR 0.9 04/08/2009    IMPRESSION: VASCULAR  1. No evidence of acute aortic dissection, aortic aneurysm or other acute vascular abnormality. 2. 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 3. Heterogeneous and irregular/ulcerated atherosclerotic plaque along the descending thoracic aorta. 4. Concentric hypertrophy of the left ventricular myocardium suggests chronic systolic hypertension.   ASSESSMENT/PLAN: This is a 69 y.o. female right facial numbness and chest pain.  Brain imaging reveals left thalamic CVA.  History of bilateral carotid artery stenosis.  Currently without neurologic deficit.  -On-call vascular surgeon Dr. Donzetta Matters will review imaging and clinical data   Barbie Banner PA-C [facility] [phone] 12/20/2019  5:44 PM   I have independently interviewed and examined patient and agree with PA assessment and plan above.  Stroke appears to be thalamic in origin likely unrelated to carotid stenosis.  As such patient does not need vascular surgical intervention and can follow-up with Dr. Einar Gip for her bilateral carotid stenosis.  If neurology should have a differing opinion we could discuss surgical intervention.  Arnisha Laffoon C. Donzetta Matters, MD [facility] [place] Office: [phone] Pager: [phone]

## 2019-12-20 NOTE — Progress Notes (Signed)
  Echocardiogram 2D Echocardiogram has been performed.  [name] 12/20/2019, 4:37 PM

## 2019-12-20 NOTE — ED Notes (Signed)
Pt out of dept for MRA

## 2019-12-20 NOTE — Plan of Care (Signed)
  Problem: Education: Goal: Knowledge of General Education information will improve Description: Including pain rating scale, medication(s)/side effects and non-pharmacologic comfort measures Outcome: Progressing   Problem: Clinical Measurements: Goal: Will remain free from infection Outcome: Progressing Goal: Respiratory complications will improve Outcome: Progressing   

## 2019-12-20 NOTE — ED Notes (Addendum)
Report called to rn on 3w 

## 2019-12-21 ENCOUNTER — Inpatient Hospital Stay (HOSPITAL_COMMUNITY): Payer: Medicare Other

## 2019-12-21 DIAGNOSIS — I639 Cerebral infarction, unspecified: Secondary | ICD-10-CM | POA: Diagnosis not present

## 2019-12-21 LAB — ECHOCARDIOGRAM COMPLETE
Height: 63 in
Weight: 2880 oz

## 2019-12-21 LAB — LIPID PANEL
Cholesterol: 193 mg/dL (ref 0–200)
HDL: 45 mg/dL (ref >40)
LDL Cholesterol: 101 mg/dL — ABNORMAL HIGH (ref 0–99)
Total CHOL/HDL Ratio: 4.3 RATIO
Triglycerides: 237 mg/dL — ABNORMAL HIGH (ref <150)
VLDL: 47 mg/dL — ABNORMAL HIGH (ref 0–40)

## 2019-12-21 LAB — GLUCOSE, CAPILLARY
Glucose-Capillary: 202 mg/dL — ABNORMAL HIGH (ref 70–99)
Glucose-Capillary: 301 mg/dL — ABNORMAL HIGH (ref 70–99)
Glucose-Capillary: 351 mg/dL — ABNORMAL HIGH (ref 70–99)
Glucose-Capillary: 305 mg/dL — ABNORMAL HIGH (ref 70–99)
Glucose-Capillary: 335 mg/dL — ABNORMAL HIGH (ref 70–99)

## 2019-12-21 MED ORDER — CLOPIDOGREL BISULFATE 75 MG PO TABS
75.0000 mg | ORAL_TABLET | Freq: Every day | ORAL | Status: DC
  Administered 2019-12-21 – 2019-12-27 (×7): 75 mg via ORAL
  Filled 2019-12-21 (×7): qty 1

## 2019-12-21 MED ORDER — ASPIRIN EC 81 MG PO TBEC
81.0000 mg | DELAYED_RELEASE_TABLET | Freq: Every day | ORAL | Status: DC
  Administered 2019-12-21 – 2019-12-27 (×7): 81 mg via ORAL
  Filled 2019-12-21 (×7): qty 1

## 2019-12-21 MED ORDER — ASPIRIN EC 81 MG PO TBEC: 81.0000 mg | DELAYED_RELEASE_TABLET | Freq: Every day | ORAL | Status: DC

## 2019-12-21 MED ORDER — INSULIN DETEMIR 100 UNIT/ML ~~LOC~~ SOLN
12.0000 [IU] | Freq: Every day | SUBCUTANEOUS | Status: DC
  Administered 2019-12-21 – 2019-12-22 (×2): 12 [IU] via SUBCUTANEOUS
  Filled 2019-12-21 (×2): qty 0.12

## 2019-12-21 MED ORDER — CLOPIDOGREL BISULFATE 75 MG PO TABS: 75.0000 mg | ORAL_TABLET | Freq: Every day | ORAL | Status: DC

## 2019-12-21 MED ORDER — INSULIN ASPART 100 UNIT/ML ~~LOC~~ SOLN
0.0000 [IU] | Freq: Three times a day (TID) | SUBCUTANEOUS | Status: DC
  Administered 2019-12-21: 11 [IU] via SUBCUTANEOUS
  Administered 2019-12-22: 15 [IU] via SUBCUTANEOUS
  Administered 2019-12-22: 8 [IU] via SUBCUTANEOUS
  Administered 2019-12-22: 15 [IU] via SUBCUTANEOUS
  Administered 2019-12-23: 3 [IU] via SUBCUTANEOUS
  Administered 2019-12-23: 8 [IU] via SUBCUTANEOUS
  Administered 2019-12-24: 5 [IU] via SUBCUTANEOUS
  Administered 2019-12-24: 3 [IU] via SUBCUTANEOUS
  Administered 2019-12-24 – 2019-12-25 (×2): 8 [IU] via SUBCUTANEOUS
  Administered 2019-12-25: 5 [IU] via SUBCUTANEOUS
  Administered 2019-12-25 – 2019-12-26 (×2): 3 [IU] via SUBCUTANEOUS
  Administered 2019-12-26: 2 [IU] via SUBCUTANEOUS
  Administered 2019-12-26 – 2019-12-27 (×2): 11 [IU] via SUBCUTANEOUS
  Administered 2019-12-27: 2 [IU] via SUBCUTANEOUS
  Administered 2019-12-27: 5 [IU] via SUBCUTANEOUS

## 2019-12-21 NOTE — Progress Notes (Signed)
Nutrition Brief Note  RD consulted to assess after stroke. Pt reports appetite was good PTA and states that she was eating regular, balanced meals throughout the day with healthy snacks. Pt denies any wt loss over the last year. Per wt readings below, pt with a 4% wt loss x 6 months, which is not significant for time frame.  Wt Readings from Last 15 Encounters:  12/19/19 81.6 kg  06/22/19 85.3 kg  04/14/19 85.8 kg  10/10/18 88 kg  04/08/18 80.5 kg  02/02/17 82 kg  11/12/16 81.6 kg  08/15/15 78 kg  08/09/15 79.4 kg  07/30/15 79.4 kg  07/09/15 79.8 kg  05/16/15 79.6 kg  09/19/14 74.2 kg  08/13/14 76.5 kg  07/24/14 75.8 kg    Body mass index is 31.89 kg/m. Patient meets criteria for obesity based on current BMI.   Current diet order is Heart Healthy/CHO Mod, patient is consuming approximately 75-100% of meals at this time per discussion with RN. Labs and medications reviewed.   No nutrition interventions warranted at this time. If nutrition issues arise, please consult RD.   Larkin Ina, MS, RD, LDN RD pager number and weekend/on-call pager number located in [place].

## 2019-12-21 NOTE — Progress Notes (Signed)
Occupational Therapy Evaluation  Clinical Impression: PTA pt lived alone, independent in all ADL, IADL, and mobility tasks. Pt does not ambulate with an assistive device and reports 0 falls in the last 6 months. Pt currently requires setup to min assist for self-care and mobility tasks. Pt able to ambulate to/from bathroom with RW and min guard. 0 instances of LOB, however pt unsteady on feet. Pt completed toileting task and tolerated standing 6.5 min at the sink to complete grooming/hygiene tasks. Pt required mod cues to incorporate use of RUE into all tasks with fair follow through. Pt reported mod fatigue following tasks. Educated pt on ROM exercises to complete with RUE noting good understanding and follow through. Pt demonstrates decreased ROM, FMC, GMC, sensation, strength, endurance, balance, standing tolerance, and activity tolerance impacting ability to complete self-care and functional transfer tasks. Recommend skilled OT services to address above deficits in order to promote function and prevent further decline. Recommend CIR placement for additional rehab prior to discharge home. Pt lives alone and has minimal support available after discharge. Pt currently unable to safely and independently care for herself at this time.     12/21/19 1000  OT Visit Information  Last OT Received On 12/21/19  Assistance Needed +1  History of Present Illness Pt is a 69 yo female presenting with c/o chest pain and R-sided face and arm weakness/numbness and tingling. Upon admission, imaging revealed acute L thalamic CVA. PMH includes: Myasthenia gravis, HTN, DM II, thyroid cancer, HCL, and OSA.  Precautions  Precautions Fall  Restrictions  Weight Bearing Restrictions No  Home Living  Family/patient expects to be discharged to: Private residence  Living Arrangements Alone  Available Help at Discharge Family;Available PRN/intermittently  Type of Home House  Home Access Stairs to enter  Entrance  Stairs-Number of Steps side: 2 without rail; front door: 5 without rail  Entrance Stairs-Rails None  Home Layout One level  Bathroom Shower/Tub Tub/shower unit  Research officer, trade [employer] - single point;Cane - quad;Grab bars - tub/shower  Prior Function  Level of Independence Independent  Comments Pt independent in all ADL, IADL, and mobility tasks. Pt does not ambulate with an assistive device and reports 0 falls in the last 6 months. Pt still drives and is retired.   Communication  Communication No difficulties  Pain Assessment  Pain Assessment 0-10  Pain Score 5  Pain Location chest  Pain Descriptors / Indicators Pressure;Stabbing  Pain Intervention(s) Monitored during session;Repositioned  Cognition  Arousal/Alertness Awake/alert  Behavior During Therapy WFL for tasks assessed/performed  Overall Cognitive Status Within Functional Limits for tasks assessed  General Comments Pt pleasant and willing to participate in therapy tasks. Pt able to follow multi-step instructions without difficulty.   Upper Extremity Assessment  Upper Extremity Assessment Generalized weakness;RUE deficits/detail  RUE Deficits / Details Slow, uncoordinated movements. 3-/5 IR, supination/pronation, and finger flex/ext.   RUE Sensation decreased light touch (in right hand only)  RUE Coordination decreased fine motor;decreased gross motor  Lower Extremity Assessment  Lower Extremity Assessment Defer to PT evaluation  ADL  Overall ADL's  Needs assistance/impaired  Eating/Feeding Set up;Sitting  Grooming Minimal assistance;Oral care;Wash/dry hands;Wash/dry face;Standing  Grooming Details (indicate cue type and reason) While standing at the sink. Assist for opening containers.   Upper Body Bathing Set up;Supervision/ safety;Sitting  Lower Body Bathing Supervison/ safety;Min guard;Sit to/from stand  Upper Body Dressing  Set up;Supervision/safety;Sitting  Lower Body Dressing Minimal  assistance;Sitting/lateral leans  Lower Body Dressing Details (indicate cue  type and reason) To don/doff socks. Cues to incorporate use of RUE into task.   Surveyor, minerals guard;Ambulation;Regular Toilet;Grab bars  Toileting- Therapist, occupational;Sit to/from stand  Toileting - Clothing Manipulation Details (indicate cue type and reason) Supervision while seated, min guard while standing to complete.   Functional mobility during ADLs Min guard;Rolling walker  General ADL Comments Pt able to ambulate to/from bathroom with RW and min guard. 0 instances of LOB, however pt unsteady on feet. Pt tolerated standing 6.5 min at the sink to complete grooming tasks.   Vision- History  Baseline Vision/History Wears glasses (Myasthenia gravis affecting eyes)  Wears Glasses At all times  Patient Visual Report Diplopia (double vision at baseline.)  Vision- Assessment  Vision Assessment? Vision impaired- to be further tested in functional context  Tracking/Visual Pursuits Decreased smoothness of eye movement to RIGHT inferior field  Convergence Impaired (comment)  Additional Comments Peripheral vision intact. Convergence impaired. Pt reports visual deficits at baseline.   Bed Mobility  General bed mobility comments Pt seated in bedside chair upon OT arrival  Transfers  Overall transfer level Needs assistance  Equipment used Rolling walker (2 wheeled)  Transfers Sit to/from Stand  Sit to Stand Min guard  General transfer comment To ensure balance and safety. Cues for hand placement.   Balance  Overall balance assessment Needs assistance  Sitting-balance support Feet supported  Sitting balance-Leahy Scale Good  Standing balance-Leahy Scale Fair  General Comments  General comments (skin integrity, edema, etc.) No signs/symptoms of distress  OT - End of Session  Equipment Utilized During Treatment Gait belt;Rolling walker  Activity Tolerance Patient  tolerated treatment well  Patient left in chair;with call bell/phone within reach  Nurse Communication Mobility status  OT Assessment  OT Recommendation/Assessment Patient needs continued OT Services  OT Visit Diagnosis Unsteadiness on feet (R26.81);Muscle weakness (generalized) (M62.81);Hemiplegia and hemiparesis  Hemiplegia - Right/Left Right  Hemiplegia - dominant/non-dominant Dominant  Hemiplegia - caused by Cerebral infarction  OT Problem List Decreased strength;Decreased range of motion;Decreased activity tolerance;Impaired balance (sitting and/or standing);Impaired vision/perception;Decreased coordination;Impaired UE functional use;Impaired sensation  Barriers to Discharge Other (comment)  Barriers to Discharge Comments Pt lives alone with minimal support  OT Plan  OT Frequency (ACUTE ONLY) Min 3X/week  OT Treatment/Interventions (ACUTE ONLY) Self-care/ADL training;Therapeutic exercise;Neuromuscular education;Energy conservation;DME and/or AE instruction;Therapeutic activities;Visual/perceptual remediation/compensation;Patient/family education;Balance training  AM-PAC OT "6 Clicks" Daily Activity Outcome Measure (Version 2)  Help from another person eating meals? 3  Help from another person taking care of personal grooming? 3  Help from another person toileting, which includes using toliet, bedpan, or urinal? 3  Help from another person bathing (including washing, rinsing, drying)? 3  Help from another person to put on and taking off regular upper body clothing? 3  Help from another person to put on and taking off regular lower body clothing? 3  6 Click Score 18  OT Recommendation  Recommendations for Other Services Rehab consult  Follow Up Recommendations CIR;Supervision/Assistance - 24 hour  OT Equipment 3 in 1 bedside commode (for use in shower)  Acute Rehab OT Goals  Patient Stated Goal to go home  Time For Goal Achievement 01/04/20  Potential to Achieve Goals Good  OT Time  Calculation  OT Start Time (ACUTE ONLY) 0958  OT Stop Time (ACUTE ONLY) 1034  OT Time Calculation (min) 36 min  OT General Charges  $OT Visit 1 Visit  OT Evaluation  $OT Eval Moderate Complexity 1 Mod  OT Treatments  $  Self Care/Home Management  8-22 mins  Written Expression  Dominant Hand Right   Mauri Brooklyn OTR/L [phone]

## 2019-12-21 NOTE — Progress Notes (Signed)
Occupational Therapy Treatment Patient Details Name: [name] MRN: [id number] DOB: 10-Apr-1951 Today's Date: 12/21/2019    History of present illness Pt is a 69 yo female presenting with c/o chest pain and R-sided face and arm weakness/numbness and tingling. Upon admission, imaging revealed acute L thalamic CVA. PMH includes: Myasthenia gravis, HTN, DM II, thyroid cancer, HCL, and OSA.   OT comments  Session focused on increasing function and coordination of right hand. Began education/instruction with pt on right hand [place] HEP. Pt required mod cues on technique requiring increased time to complete first 5 exercises on sheet. Encouraged pt to let right hand complete exercises as independently as possible rather than using left hand to assist. Educated pt on completing exercises concurrently with left hand as visual feedback. All questions/concerns answered at this time. Pt reported right hand fatigue following exercises. OT will continue to follow acutely. Continue to recommend CIR for additional rehab prior to discharge home.    Follow Up Recommendations  CIR;Supervision/Assistance - 24 hour    Equipment Recommendations  3 in 1 bedside commode    Recommendations for Other Services      Precautions / Restrictions Precautions Precautions: Fall Restrictions Weight Bearing Restrictions: No       Mobility Bed Mobility Overal bed mobility: Needs Assistance Bed Mobility: Supine to Sit     Supine to sit: Supervision;HOB elevated        Transfers                      Balance Overall balance assessment: Needs assistance Sitting-balance support: Feet supported Sitting balance-Leahy Scale: Good                                     ADL either performed or assessed with clinical judgement   ADL                                               Vision       Perception     Praxis      Cognition Arousal/Alertness:  Awake/alert Behavior During Therapy: WFL for tasks assessed/performed Overall Cognitive Status: Within Functional Limits for tasks assessed                                          Exercises Exercises: Other exercises Other Exercises Other Exercises: Began education/instruction with pt on right hand [facility] HEP. Pt required mod cues on technique requiring increased time to complete first 5 exercises on sheet. Encouraged pt to let right hand complete exercises as independently as possible rather than using left hand to assist. Educated pt on completing exercises concurrently with left hand as visual feedback.     Shoulder Instructions       General Comments No signs/symptoms of distress    Pertinent Vitals/ Pain       Pain Assessment: No/denies pain  Home Living                                          Prior Functioning/Environment  Frequency           Progress Toward Goals  OT Goals(current goals can now be found in the care plan section)  Progress towards OT goals: Progressing toward goals  ADL Goals Pt Will Perform Grooming: with modified independence;standing Pt Will Perform Lower Body Bathing: with modified independence;sit to/from stand Pt Will Perform Lower Body Dressing: with modified independence;sit to/from stand Pt Will Transfer to Toilet: with modified independence;ambulating;regular height toilet Pt Will Perform Toileting - Clothing Manipulation and hygiene: with modified independence;sit to/from stand Pt Will Perform Tub/Shower Transfer: Tub transfer;with supervision;grab bars Pt/caregiver will Perform Home Exercise Program: Increased ROM;Increased strength;Right Upper extremity;With written HEP provided;With theraputty Additional ADL Goal #1: Pt to tolerate standing up to 10 min with modified independence, in preparation for ADLs.  Plan Discharge plan remains appropriate    Co-evaluation                  AM-PAC OT "6 Clicks" Daily Activity     Outcome Measure   Help from another person eating meals?: A Little Help from another person taking care of personal grooming?: A Little Help from another person toileting, which includes using toliet, bedpan, or urinal?: A Little Help from another person bathing (including washing, rinsing, drying)?: A Little Help from another person to put on and taking off regular upper body clothing?: A Little Help from another person to put on and taking off regular lower body clothing?: A Little 6 Click Score: 18    End of Session    OT Visit Diagnosis: Unsteadiness on feet (R26.81);Muscle weakness (generalized) (M62.81);Hemiplegia and hemiparesis Hemiplegia - Right/Left: Right Hemiplegia - dominant/non-dominant: Dominant Hemiplegia - caused by: Cerebral infarction   Activity Tolerance Patient tolerated treatment well   Patient Left in bed;with call bell/phone within reach   Nurse Communication Mobility status        Time: 1700-1720 OT Time Calculation (min): 20 min  Charges: OT General Charges $OT Visit: 1 Visit OT Treatments $Therapeutic Exercise: 8-22 mins  Mauri Brooklyn OTR/L [phone]   Mauri Brooklyn 12/21/2019, 5:37 PM

## 2019-12-21 NOTE — Evaluation (Signed)
Physical Therapy Evaluation Patient Details Name: [name] MRN: [id number] DOB: 14-Feb-1951 Today's Date: 12/21/2019   History of Present Illness  Pt is a 69 yo female presenting with c/o chest pain and R-sided face and arm weakness/numbness and tingling. Upon admission, imaging revealed acute L thalamic CVA. PMH includes: Myasthenia gravis, HTN, DM II, thyroid cancer, HCL, and OSA.  Clinical Impression  Pt in bed upon arrival of PT, agreeable to evaluation at this time. Prior to admission the pt was independent with all mobility and living alone in a home with 2 STE. The pt now presents with limitations in functional mobility, endurance, strength, and stability due to above dx, and will continue to benefit from skilled PT to address these deficits. The pt was able to demo good bed mobility and tolerance for short ambulation to hallway (~30 ft) with use of RW. The pt reports sig fatigue following short walk, which is likely due to significantly reduced mobility since the start of COVID over 1 year ago. The pt will continue to benefit from skilled therapy to improve endurance and safety/independence with mobility to return to her prior level of independence due to lack of supervision for safety at home.   5X Sit-to-Stand: 60 sec (> 11.4 sec indicates increased risk of falls for individuals aged 60-69, > 15 sec indicates increased risk of recurrent falls)     Follow Up Recommendations CIR;Supervision/Assistance - 24 hour. Pt previously independent, lacking social support for supervision at home which is necessary for safety. Would benefit from short stint intensive therapy to return to prior independence.    Equipment Recommendations  Rolling walker with 5" wheels;3in1 (PT)(defer to post acute)    Recommendations for Other Services Rehab consult     Precautions / Restrictions Precautions Precautions: Fall Precaution Comments: pt reports no recent falls, "knees give out" about  2x/month Restrictions Weight Bearing Restrictions: No      Mobility  Bed Mobility Overal bed mobility: Needs Assistance Bed Mobility: Supine to Sit     Supine to sit: Supervision     General bed mobility comments: pt able to come to sitting EOB without assist or VC  Transfers Overall transfer level: Needs assistance Equipment used: Rolling walker (2 wheeled);None Transfers: Sit to/from Stand Sit to Stand: Min assist;Min guard         General transfer comment: initially minA with RW for safety and stability, pt able to stand from recliner with minG and no AD for 5XSTS (60 sec)  Ambulation/Gait Ambulation/Gait assistance: Min guard Gait Distance (Feet): 30 Feet Assistive device: Rolling walker (2 wheeled) Gait Pattern/deviations: Step-through pattern;Decreased stride length;Trunk flexed   Gait velocity interpretation: <1.8 ft/sec, indicate of risk for recurrent falls General Gait Details: pt with sig slowed gait with minimal clearance and heel-toe pattern. Pt with sig reliance on BUE, some assist/cues for imrpved grip with RUE  Stairs            Wheelchair Mobility    Modified Rankin (Stroke Patients Only) Modified Rankin (Stroke Patients Only) Pre-Morbid Rankin Score: Slight disability Modified Rankin: Moderately severe disability     Balance Overall balance assessment: Needs assistance Sitting-balance support: No upper extremity supported;Feet supported Sitting balance-Leahy Scale: Good     Standing balance support: Bilateral upper extremity supported;During functional activity Standing balance-Leahy Scale: Poor Standing balance comment: reliant on BUE support  Pertinent Vitals/Pain Pain Assessment: No/denies pain Pain Score: (P) 5  Pain Location: (P) chest Pain Descriptors / Indicators: (P) Pressure;Stabbing Pain Intervention(s): Limited activity within patient's tolerance;Monitored during session    [place] expects to be discharged to:: (P) Private residence Living Arrangements: (P) Alone Available Help at Discharge: (P) Family;Available PRN/intermittently Type of Home: (P) House Home Access: (P) Stairs to enter Entrance Stairs-Rails: (P) None Entrance Stairs-Number of Steps: (P) side: 2 without rail; front door: 5 without rail Home Layout: (P) One level Home Equipment: (P) Cane - single point;Cane - quad;Grab bars - tub/shower      Prior Function Level of Independence: (P) Independent         Comments: (P) Pt independent in all ADL, IADL, and mobility tasks. Pt does not ambulate with an assistive device and reports 0 falls in the last 6 months. Pt still drives and is retired.      Hand Dominance   Dominant Hand: (P) Right    Extremity/Trunk Assessment   Upper Extremity Assessment Upper Extremity Assessment: Defer to OT evaluation;RUE deficits/detail RUE Deficits / Details: pt reports numbness and tingling in RUE starting at wrist, generally 4-/5 RUE RUE Sensation: decreased light touch    Lower Extremity Assessment Lower Extremity Assessment: RLE deficits/detail RLE Deficits / Details: pt reports slightly decreased sensation entire RLE, 4-/5 globally RLE Sensation: decreased light touch    Cervical / Trunk Assessment Cervical / Trunk Assessment: Normal  Communication   Communication: (P) No difficulties  Cognition Arousal/Alertness: Awake/alert Behavior During Therapy: WFL for tasks assessed/performed Overall Cognitive Status: Within Functional Limits for tasks assessed                                        General Comments      Exercises     Assessment/Plan    PT Assessment Patient needs continued PT services  PT Problem List Decreased strength;Decreased mobility;Decreased activity tolerance;Decreased balance;Impaired sensation       PT Treatment Interventions DME instruction;Therapeutic exercise;Gait training;Stair  training;Functional mobility training;Therapeutic activities;Patient/family education;Cognitive remediation;Neuromuscular re-education;Balance training    PT Goals (Current goals can be found in the Care Plan section)  Acute Rehab PT Goals Patient Stated Goal: return home PT Goal Formulation: With patient Time For Goal Achievement: 01/04/20 Potential to Achieve Goals: Good    Frequency Min 4X/week   Barriers to discharge Decreased caregiver support pt reports she has no family or friends who could provide supervision after d/c from hospital    Co-evaluation               AM-PAC PT "6 Clicks" Mobility  Outcome Measure Help needed turning from your back to your side while in a flat bed without using bedrails?: None Help needed moving from lying on your back to sitting on the side of a flat bed without using bedrails?: None Help needed moving to and from a bed to a chair (including a wheelchair)?: A Little Help needed standing up from a chair using your arms (e.g., wheelchair or bedside chair)?: A Little Help needed to walk in hospital room?: A Little Help needed climbing 3-5 steps with a railing? : A Lot 6 Click Score: 19    End of Session Equipment Utilized During Treatment: Gait belt Activity Tolerance: Patient tolerated treatment well;Patient limited by fatigue Patient left: in chair;with call bell/phone within reach;with chair alarm set;with nursing/sitter in room  Nurse Communication: Mobility status PT Visit Diagnosis: History of falling (Z91.81);Muscle weakness (generalized) (M62.81);Difficulty in walking, not elsewhere classified (R26.2)    Time: [id number] PT Time Calculation (min) (ACUTE ONLY): 40 min   Charges:   PT Evaluation $PT Eval Moderate Complexity: 1 Mod PT Treatments $Gait Training: 23-37 mins        Karma Ganja, PT, DPT   Acute Rehabilitation Department Pager #: [phone]   Otho Bellows 12/21/2019, 1:20 PM

## 2019-12-21 NOTE — Plan of Care (Signed)
  Problem: Health Behavior/Discharge Planning: Goal: Ability to manage health-related needs will improve Outcome: Progressing   Problem: Health Behavior/Discharge Planning: Goal: Ability to manage health-related needs will improve Outcome: Progressing   Problem: Clinical Measurements: Goal: Ability to maintain clinical measurements within normal limits will improve Outcome: Progressing Goal: Diagnostic test results will improve Outcome: Progressing

## 2019-12-21 NOTE — Consult Note (Signed)
Physical Medicine and Rehabilitation Consult Reason for Consult: Right side weakness Referring Physician: [facility]   HPI: NIMSI MALES is a 69 y.o. right-handed female with history of diabetes mellitus, myasthenia gravis, OSA on CPAP, thyroid cancer, hypertension, hyperlipidemia.  Per chart review patient lives alone independent prior to admission and driving.  1 level home.  Presented 12/20/2019 with right side weakness.  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  Nuclear medicine perfusion scan with no pulmonary embolus.  Patient recent carotid duplex noting stenosis right internal carotid artery greater than 70% and left ICA stenosis 50-69%.  Echo with ejection fraction of 81% grade 1 diastolic dysfunction.  Admission chemistries with potassium 3.3, troponin negative, BNP 21.5.  Currently maintained on aspirin Plavix for CVA prophylaxis.  Subcutaneous Lovenox for DVT prophylaxis.  Vascular surgery consulted in regards to carotid stenosis awaiting plan of care.  Therapy evaluations completed with recommendations physical medicine rehab consult.  LBM today.  Still c/o L chest discomfort- stabbing, throbbing pain- under bra line on L chest.  Also has R face and hand numbness that's stable.  Voiding well   Review of Systems  Constitutional: Negative for chills and fever.  HENT: Negative for hearing loss.   Eyes: Negative for blurred vision and double vision.  Respiratory: Negative for cough and shortness of breath.   Cardiovascular: Negative for chest pain, palpitations and leg swelling.  Gastrointestinal: Positive for constipation. Negative for heartburn, nausea and vomiting.       GERD  Genitourinary: Negative for flank pain.  Musculoskeletal: Positive for myalgias.  Skin: Negative for rash.  Neurological: Positive for dizziness, sensory change and weakness.  All other systems reviewed and are negative.  Past Medical History:  Diagnosis Date  . Arthritis    "all over my body" (03/13/2013)  . Asthma   . Asymptomatic carotid artery stenosis, bilateral 10/08/2018  . GERD (gastroesophageal reflux disease)   . H/O hiatal hernia   . Heart murmur   . Hypercholesterolemia 10/08/2018  . Hypertension   . Hypothyroidism   . Myasthenia gravis ([place])    "in my eyes; diagnsosed > 7 yr ago" (03/13/2013)  . Sleep apnea    on CPAP  . Thyroid carcinoma ([place])   . Type II diabetes mellitus ([place])    Past Surgical History:  Procedure Laterality Date  . ABDOMINAL HYSTERECTOMY    . ANTERIOR CERVICAL DECOMP/DISCECTOMY FUSION     "I've had severa ORs; always went in from the front" (03/13/2013)  . APPENDECTOMY    . CARDIAC CATHETERIZATION     "several" (03/13/2013)  . CARPAL TUNNEL RELEASE Right   . CATARACT EXTRACTION W/ INTRAOCULAR LENS  IMPLANT, BILATERAL Bilateral   . CHOLECYSTECTOMY    . KNEE ARTHROSCOPY Left   . SHOULDER ARTHROSCOPY W/ ROTATOR CUFF REPAIR Left   . TONSILLECTOMY    . TOTAL THYROIDECTOMY     Family History  Problem Relation Age of Onset  . Coronary artery disease Sister        s/p coronary stenting  . Stroke Sister 23  . Diabetes Sister   . Congestive Heart Failure Mother   . Asthma Mother   . Diabetes Mother   . Congestive Heart Failure Father   . Lung cancer Father   . Asthma Father   . Diabetes Father   . Diabetes Brother    Social History:  reports that she has never smoked. She has never used smokeless tobacco. She reports that  she does not drink alcohol or use drugs. Allergies:  Allergies  Allergen Reactions  . Contrast Media [Iodinated Diagnostic Agents] Anaphylaxis    01-10-15---PT GIVEN 13 HR PRE MEDS FOR CT--PT TOLERATED IV CONTRAST W/O ANY REACTION------[name] RT-R CT  . Fluorescein Shortness Of Breath and Other (See Comments)    (Dye)  . Iodine Anaphylaxis    Contrast dye - iodine  . Molds & Smuts Shortness Of Breath and Other (See Comments)    Congestion and wheezing, also  . Shellfish-Derived Products  Anaphylaxis, Shortness Of Breath, Swelling and Other (See Comments)    Welts, also  . Codeine Hives and Nausea Only  . Other Rash and Other (See Comments)    Coban causes welts, also   Medications Prior to Admission  Medication Sig Dispense Refill  . Biotin 5000 MCG CAPS Take 5,000 mcg by mouth daily with breakfast.     . BUTRANS 10 MCG/HR PTWK patch Place 1 patch onto the skin See admin instructions. Place 1 patch onto the skin every 7 days as needed for pain    . BYDUREON BCISE 2 MG/0.85ML AUIJ Inject 2 mg into the skin every Monday.     . canagliflozin (INVOKANA) 300 MG TABS tablet Take 300 mg by mouth daily before breakfast.    . Continuous Blood Gluc Receiver (FREESTYLE LIBRE 14 DAY READER) DEVI 1 Device by Does not apply route as directed.     . Continuous Blood Gluc Sensor (FREESTYLE LIBRE 14 DAY SENSOR) MISC Inject 1 patch into the skin every 14 (fourteen) days.    . cycloSPORINE (RESTASIS) 0.05 % ophthalmic emulsion Place 1 drop into both eyes 2 (two) times daily.    . diclofenac sodium (VOLTAREN) 1 % GEL Apply 2 g topically daily as needed (to painful sites).     . diphenhydrAMINE (BENADRYL) 25 mg capsule Take 25 mg by mouth every 6 (six) hours as needed for allergies.    . diphenhydrAMINE in sodium chloride 0.9 % 50 mL Inject 50 mg into the vein as directed. 50 mg with all infusions    . esomeprazole (NEXIUM) 40 MG capsule Take 40 mg by mouth 2 (two) times daily.    Marland Kitchen ezetimibe (ZETIA) 10 MG tablet Take 1 tablet (10 mg total) by mouth daily. 90 tablet 1  . ibuprofen (ADVIL,MOTRIN) 800 MG tablet Take 800 mg by mouth See admin instructions. Take 800 mg by mouth every 4-6 hours as needed for pain or headaches    . Immune Globulin, Human, 40 GM/400ML SOLN Inject as directed See admin instructions. At home, for 2 days, every 6 weeks    . Insulin Disposable Pump (OMNIPOD DASH SYSTEM) KIT continuous.     . insulin lispro (HUMALOG) 100 UNIT/ML injection Inject into the skin See admin  instructions. Per insulin pump (Omnipod Dash)    . levalbuterol (XOPENEX) 0.63 MG/3ML nebulizer solution Take 0.63 mg by nebulization every 6 (six) hours as needed for wheezing or shortness of breath.     . Levothyroxine Sodium 137 MCG CAPS Take 137 mcg by mouth daily before breakfast.     . meclizine (ANTIVERT) 25 MG tablet Take 25 mg by mouth 3 (three) times daily as needed for dizziness (or migraine-related nausea).     . Misc Natural Products (AIRBORNE ELDERBERRY) CHEW Chew 1 tablet by mouth daily.    . mometasone (NASONEX) 50 MCG/ACT nasal spray Place 2 sprays into the nose daily as needed (for allergies).     . montelukast (SINGULAIR)  10 MG tablet Take 10 mg by mouth every morning.    . mycophenolate (CELLCEPT) 500 MG tablet Take 1,500 mg by mouth in the morning and at bedtime.     Marland Kitchen olmesartan (BENICAR) 20 MG tablet Take 20 mg by mouth daily.    . predniSONE (DELTASONE) 10 MG tablet Take 10 mg by mouth every morning.    Marland Kitchen PRESCRIPTION MEDICATION See admin instructions. CPAP- At bedtime    . PROAIR HFA 108 (90 BASE) MCG/ACT inhaler Inhale 2 puffs into the lungs every 6 (six) hours as needed for wheezing or shortness of breath.     . riTUXimab (RITUXAN) 500 MG/50ML injection Inject 1,000 mg into the vein See admin instructions. 1,000 mg via IV, then another 1,000 mg in two weeks- repeat after 6 months: "riTUXimab (RITUXAN) 1,000 mg in sodium chloride 0.9% 1,000 mL infusion- Begin infusion at 50 mL/hr, if no hypersensitivity or infusion-related events occur, may increase by 50 mL/hr every 30 minutes to a maximum infusion rate of 400 mL/hr"    . spironolactone (ALDACTONE) 25 MG tablet Take 25 mg by mouth daily.    . TRANSDERM-SCOP, 1.5 MG, 1 MG/3DAYS Place 1 patch onto the skin every three (3) days as needed ("for migraine relief").     . vitamin B-12 (CYANOCOBALAMIN) 1000 MCG tablet Take 1,000 mcg by mouth daily.    . Vitamin D, Ergocalciferol, (DRISDOL) 50000 UNITS CAPS capsule Take 50,000 Units  by mouth every Monday.     . zolpidem (AMBIEN) 5 MG tablet Take 5 mg by mouth at bedtime as needed for sleep.     . B-D ULTRAFINE III SHORT PEN 31G X 8 MM MISC     . rosuvastatin (CRESTOR) 40 MG tablet Take 40 mg by mouth at bedtime.    . simvastatin (ZOCOR) 40 MG tablet Take 1 tablet (40 mg total) by mouth daily at 6 PM. (Patient not taking: Reported on 12/19/2019) 90 tablet 1  . SYMBICORT 160-4.5 MCG/ACT inhaler USE 2 INHALATIONS TWICE A DAY TO PREVENT COUGH OR WHEEZE. RINSE, GARGLE AND SPIT AFTER USE (Patient taking differently: Inhale 2 puffs into the lungs See admin instructions. ) 30.6 g 1    Home: Home Living Family/patient expects to be discharged to:: Private residence Living Arrangements: Alone Available Help at Discharge: Family, Available PRN/intermittently Type of Home: House Home Access: Stairs to enter [employer] of Steps: side: 2 without rail; front door: 5 without rail Entrance Stairs-Rails: None Home Layout: One level Bathroom Shower/Tub: Chiropodist: Standard Home Equipment: Cane - single point, [employer] - quad, Grab bars - tub/shower Additional Comments: pt reports she is unsure of AD, it was all her mothers.  Functional History: Prior Function Level of Independence: Independent Comments: Pt independent in all ADL, IADL, and mobility tasks. Pt does not ambulate with an assistive device and reports 0 falls in the last 6 months. Pt still drives and is retired.  Functional Status:  Mobility: Bed Mobility Overal bed mobility: Needs Assistance Bed Mobility: Supine to Sit Supine to sit: Supervision General bed mobility comments: pt able to come to sitting EOB without assist or VC Transfers Overall transfer level: Needs assistance Equipment used: Rolling walker (2 wheeled), None Transfers: Sit to/from Stand Sit to Stand: Min assist, Min guard General transfer comment: initially minA with RW for safety and stability, pt able to stand from  recliner with minG and no AD for 5XSTS (60 sec) Ambulation/Gait Ambulation/Gait assistance: Min guard Gait Distance (Feet): 30 Feet  Assistive device: Rolling walker (2 wheeled) Gait Pattern/deviations: Step-through pattern, Decreased stride length, Trunk flexed General Gait Details: pt with sig slowed gait with minimal clearance and heel-toe pattern. Pt with sig reliance on BUE, some assist/cues for imrpved grip with RUE Gait velocity interpretation: <1.8 ft/sec, indicate of risk for recurrent falls    ADL: ADL Overall ADL's : Needs assistance/impaired Eating/Feeding: Set up, Sitting Grooming: Minimal assistance, Oral care, Wash/dry hands, Wash/dry face, Standing Grooming Details (indicate cue type and reason): While standing at the sink. Assist for opening containers.  Upper Body Bathing: Set up, Supervision/ safety, Sitting Lower Body Bathing: Supervison/ safety, Min guard, Sit to/from stand Upper Body Dressing : Set up, Supervision/safety, Sitting Lower Body Dressing: Minimal assistance, Sitting/lateral leans Lower Body Dressing Details (indicate cue type and reason): To don/doff socks. Cues to incorporate use of RUE into task.  Toilet Transfer: Min guard, Ambulation, Regular Toilet, Grab bars Toileting- Water quality scientist and Hygiene: Min guard, Supervision/safety, Sit to/from stand Toileting - Clothing Manipulation Details (indicate cue type and reason): Supervision while seated, min guard while standing to complete.  Functional mobility during ADLs: Min guard, Rolling walker General ADL Comments: Pt able to ambulate to/from bathroom with RW and min guard. 0 instances of LOB, however pt unsteady on feet. Pt tolerated standing 6.5 min at the sink to complete grooming tasks.   Cognition: Cognition Overall Cognitive Status: Within Functional Limits for tasks assessed Orientation Level: Oriented X4 Cognition Arousal/Alertness: Awake/alert Behavior During Therapy: WFL for tasks  assessed/performed Overall Cognitive Status: Within Functional Limits for tasks assessed General Comments: Pt pleasant and willing to participate in therapy tasks. Pt able to follow multi-step instructions without difficulty.   Blood pressure (!) 143/63, pulse (!) 103, temperature 98.3 F (36.8 C), temperature source Oral, resp. rate 19, height '5\' 3"'$  (1.6 m), weight 81.6 kg, SpO2 98 %. Physical Exam  Nursing note and vitals reviewed. Constitutional: She is oriented to person, place, and time.  Older female sitting up in bed; appropriate, sister at bedside, NAD  HENT:  Head: Normocephalic and atraumatic.  R facial droop R facial sensory loss/decreased Tongue midline  Eyes: Conjunctivae and EOM are normal. Right eye exhibits no discharge. Left eye exhibits no discharge.  Low lids due to myasthenia gravis No nystagmus on exam- EOMI B/L  Neck: No tracheal deviation present.  Cardiovascular:  No murmur heard. RRR- no JVD  Respiratory: No stridor.  CTA B/L- good air movement B/L  GI:  Soft, NT, ND;  (+)BS  Musculoskeletal:        General: No edema. Normal range of motion.     Cervical back: Normal range of motion and neck supple.     Comments: RUE- biceps 4-/5, triceps 4-/5, WE 4/5, grip 4/5, finger abd 4-/5 LUE 5/5 in same muscles tested RLE- HF 4-/5, KE 4/5, DF 4-/5, PF 4/5 LLE- 5/5 in same muscles tested TTP over L chest- esp over ribs  Neurological: She is alert and oriented to person, place, and time.  Patient is alert in no acute distress.  Follows commands.  Provides name and age.  Fair awareness of deficits.  Somewhat decrease in sensation of RLE; increased impairment in sensation to light touch in R arm, but R hand is almost numb completely.  No hoffman's no clonus B/L  Skin: Skin is warm and dry. No rash noted. No erythema.  Psychiatric:  Appropriate, cordial    Results for orders placed or performed during the hospital encounter of 12/19/19 (from the past  24 hour(s))    Respiratory Panel by RT PCR (Flu A&B, Covid) - Nasopharyngeal Swab     Status: None   Collection Time: 12/20/19  3:00 PM   Specimen: Nasopharyngeal Swab  Result Value Ref Range   SARS Coronavirus 2 by RT PCR NEGATIVE NEGATIVE   Influenza A by PCR NEGATIVE NEGATIVE   Influenza B by PCR NEGATIVE NEGATIVE  APTT     Status: None   Collection Time: 12/20/19  6:23 PM  Result Value Ref Range   aPTT 30 24 - 36 seconds  Hemoglobin A1c     Status: Abnormal   Collection Time: 12/20/19  6:23 PM  Result Value Ref Range   Hgb A1c MFr Bld 9.1 (H) 4.8 - 5.6 %   Mean Plasma Glucose 214.47 mg/dL  HIV Antibody (routine testing w rflx)     Status: None   Collection Time: 12/20/19  6:23 PM  Result Value Ref Range   HIV Screen 4th Generation wRfx Non Reactive Non Reactive  TSH     Status: None   Collection Time: 12/20/19  6:23 PM  Result Value Ref Range   TSH 0.991 0.350 - 4.500 uIU/mL  Glucose, capillary     Status: Abnormal   Collection Time: 12/20/19  9:24 PM  Result Value Ref Range   Glucose-Capillary 273 (H) 70 - 99 mg/dL   Comment 1 Notify RN    Comment 2 Document in Chart   Glucose, capillary     Status: Abnormal   Collection Time: 12/21/19  3:10 AM  Result Value Ref Range   Glucose-Capillary 202 (H) 70 - 99 mg/dL   Comment 1 Notify RN    Comment 2 Document in Chart   Glucose, capillary     Status: Abnormal   Collection Time: 12/21/19  6:05 AM  Result Value Ref Range   Glucose-Capillary 301 (H) 70 - 99 mg/dL   Comment 1 Notify RN    Comment 2 Document in Chart   Lipid panel     Status: Abnormal   Collection Time: 12/21/19  6:18 AM  Result Value Ref Range   Cholesterol 193 0 - 200 mg/dL   Triglycerides 237 (H) <150 mg/dL   HDL 45 >40 mg/dL   Total CHOL/HDL Ratio 4.3 RATIO   VLDL 47 (H) 0 - 40 mg/dL   LDL Cholesterol 101 (H) 0 - 99 mg/dL  Glucose, capillary     Status: Abnormal   Collection Time: 12/21/19 11:26 AM  Result Value Ref Range   Glucose-Capillary 351 (H) 70 - 99  mg/dL   DG Chest 2 View  Result Date: 12/19/2019 CLINICAL DATA:  Left-sided chest pain EXAM: CHEST - 2 VIEW COMPARISON:  06/14/2017 FINDINGS: Right-sided central venous port tip over the proximal right atrium. Low lung volumes. No consolidation, pleural effusion or pneumothorax. Borderline to mild cardiomegaly. Aortic atherosclerosis. No pneumothorax. IMPRESSION: Low lung volumes. Borderline to mild cardiomegaly. Electronically Signed   By: Donavan Foil M.D.   On: 12/19/2019 17:20   MR BRAIN WO CONTRAST  Result Date: 12/20/2019 CLINICAL DATA:  Neuro deficit, acute, stroke suspected. Additional history provided: Right facial numbness and right arm weakness, known history of carotid disease. EXAM: MRI HEAD WITHOUT CONTRAST TECHNIQUE: Multiplanar, multiecho pulse sequences of the brain and surrounding structures were obtained without intravenous contrast. COMPARISON:  Report from head CT 05/24/2000 (images unavailable). FINDINGS: Brain: There is a 7 mm focus of restricted diffusion within the left thalamus consistent with acute lacunar infarct (series 5, image  30). Corresponding T2/FLAIR hyperintensity at this site. No acute infarct is identified elsewhere within the brain. No significant white matter disease for age. Cerebral volume is normal. No evidence of intracranial mass. No chronic intracranial blood products. No extra-axial fluid collection. No midline shift. Vascular: Expected proximal arterial flow voids. Skull and upper cervical spine: No focal marrow lesion. Sequela of prior cervical spine fusion with susceptibility artifact arising from spinal fusion hardware. Adjacent level disease at C2-C3 with a posterior disc osteophyte complex and ligamentum flavum hypertrophy contributing to suspected at least mild/moderate spinal canal stenosis. Sinuses/Orbits: Visualized orbits show no acute finding. Right maxillary sinus mucous retention cyst. Mild ethmoid and left sphenoid sinus mucosal thickening. Trace  fluid within the bilateral mastoid air cells. 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 Otherwise unremarkable MRI appearance of the brain for age. Paranasal sinus disease as described. Trace bilateral mastoid effusions. Sequela of prior cervical fusion. C2-C3 adjacent level disease with suspected at least mild/moderate spinal canal stenosis. Electronically Signed   By: Kellie Simmering DO   On: 12/20/2019 12:56   NM Pulmonary Perfusion  Result Date: 12/20/2019 CLINICAL DATA:  Left side chest pain, shortness of breath EXAM: NUCLEAR MEDICINE PERFUSION LUNG SCAN TECHNIQUE: Perfusion images were obtained in multiple projections after intravenous injection of radiopharmaceutical. Ventilation scans intentionally deferred if perfusion scan and chest x-ray adequate for interpretation during COVID 19 epidemic. RADIOPHARMACEUTICALS:  1.55 mCi Tc-12mMAA IV COMPARISON:  Chest x-ray 12/19/2019 FINDINGS: No perfusion defects seen to suggest pulmonary embolus. IMPRESSION: No evidence of pulmonary embolus. Electronically Signed   By: KRolm BaptiseM.D.   On: 12/20/2019 08:16   MR ANGIO CHEST W WO CONTRAST  Result Date: 12/20/2019 CLINICAL DATA:  69year old female with severe contrast allergy and acute chest pain concerning for possible dissection. EXAM: MRA CHEST WITH OR WITHOUT CONTRAST TECHNIQUE: Angiographic images of the chest were obtained using MRA technique with intravenous contrast. CONTRAST:  8104mGADAVIST GADOBUTROL 1 MMOL/ML IV SOLN COMPARISON:  None. FINDINGS: VASCULAR Aorta: Conventional 3 vessel arch anatomy. No evidence of dissection, aneurysm or acute thoracic aortic abnormality. Mildly irregular atherosclerotic plaque present in the descending thoracic aorta. No penetrating ulceration. Signal void in the proximal left subclavian artery over approximately 2 cm may represent an intermediate segment occlusion. Heart: The heart is normal in size. However, there is significant concentric hypertrophy  of the ventricular myocardium. Pulmonary Arteries:  Normal in caliber.  No large PE. Other: Right IJ approach single-lumen power injectable port catheter is present. The catheter tip terminates at the superior cavoatrial junction. NON-VASCULAR Mediastinum: No mediastinal mass or adenopathy. Lungs/pleura. No focal signal abnormality or abnormal enhancement. No evidence of pleural effusion. Upper abdomen: 1.8 cm simple cyst in the upper pole of the right kidney. No acute abnormality within the upper abdomen. Musculoskeletal: No focal signal abnormality or abnormal enhancement. IMPRESSION: VASCULAR 1. No evidence of acute aortic dissection, aortic aneurysm or other acute vascular abnormality. 2. 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 3. Heterogeneous and irregular/ulcerated atherosclerotic plaque along the descending thoracic aorta. 4. Concentric hypertrophy of the left ventricular myocardium suggests chronic systolic hypertension. NON-VASCULAR 1. No acute abnormality within the visualized upper abdomen. 2. Simple right upper pole renal cyst. Electronically Signed   By: HeJacqulynn Cadet.D.   On: 12/20/2019 15:01   ECHOCARDIOGRAM COMPLETE  Result Date: 12/21/2019    ECHOCARDIOGRAM REPORT   Patient Name:   Meagan S INSIYA OSHEAate  of Exam: 12/20/2019 Medical Rec #:  [id number]       Height:       63.0 in Accession #:    [id number]      Weight:       180.0 lb Date of Birth:  [birth year]        BSA:          1.849 m Patient Age:    81 years        BP:           123/70 mmHg Patient Gender: F               HR:           100 bpm. Exam Location:  Inpatient Procedure: 2D Echo Indications:     stroke 434.91  History:         Patient has prior history of Echocardiogram examinations, most                  recent 05/29/2015. Risk Factors:Hypertension, Diabetes,                  Dyslipidemia and Sleep Apnea.  Sonographer:     [place]  Referring Phys:  [place] Diagnosing Phys: Adrian Prows MD  Sonographer Comments: Image acquisition challenging due to respiratory motion. IMPRESSIONS  1. Left ventricular ejection fraction, by estimation, is 60 to 65%. The left ventricle has normal function. The left ventricle has no regional wall motion abnormalities. Left ventricular diastolic parameters are consistent with Grade I diastolic dysfunction (impaired relaxation).  2. Right ventricular systolic function is normal. The right ventricular size is normal.  3. Left atrial size was moderately dilated.  4. The mitral valve is grossly normal. Trivial mitral valve regurgitation. No evidence of mitral stenosis.  5. Mild calcification of the non coronary cusp.. The aortic valve is tricuspid. Aortic valve regurgitation is not visualized. No aortic stenosis is present.  6. The inferior vena cava is normal in size with greater than 50% respiratory variability, suggesting right atrial pressure of 3 mmHg. FINDINGS  Left Ventricle: Left ventricular ejection fraction, by estimation, is 60 to 65%. The left ventricle has normal function. The left ventricle has no regional wall motion abnormalities. The left ventricular internal cavity size was small. There is no left ventricular hypertrophy. Left ventricular diastolic parameters are consistent with Grade I diastolic dysfunction (impaired relaxation). Normal left ventricular filling pressure. Right Ventricle: The right ventricular size is normal. No increase in right ventricular wall thickness. Right ventricular systolic function is normal. Left Atrium: Left atrial size was moderately dilated. Right Atrium: Right atrial size was normal in size. Pericardium: There is no evidence of pericardial effusion. Mitral Valve: The mitral valve is grossly normal. Mild to moderate mitral annular calcification. Trivial mitral valve regurgitation. No evidence of mitral valve stenosis. Tricuspid Valve: The tricuspid valve is  normal in structure. Tricuspid valve regurgitation is not demonstrated. Aortic Valve: Mild calcification of the non coronary cusp. The aortic valve is tricuspid. Aortic valve regurgitation is not visualized. No aortic stenosis is present. There is mild calcification of the aortic valve. Pulmonic Valve: The pulmonic valve was not well visualized. Pulmonic valve regurgitation is trivial. Aorta: The aortic root is normal in size and structure. Venous: The inferior vena cava is normal in size with greater than 50% respiratory variability, suggesting right atrial pressure of 3 mmHg. IAS/Shunts: No atrial level shunt detected by color flow Doppler.  LEFT VENTRICLE PLAX 2D LVIDd:  3.20 cm  Diastology LVIDs:         2.00 cm  LV e' lateral:   7.94 cm/s LV PW:         1.20 cm  LV E/e' lateral: 8.9 LV IVS:        1.00 cm  LV e' medial:    5.87 cm/s LVOT diam:     1.80 cm  LV E/e' medial:  12.1 LV SV:         63 LV SV Index:   34 LVOT Area:     2.54 cm  RIGHT VENTRICLE RV S prime:     15.70 cm/s LEFT ATRIUM             Index       RIGHT ATRIUM          Index LA diam:        2.90 cm 1.57 cm/m  RA Area:     8.67 cm LA Vol (A2C):   33.3 ml 18.01 ml/m RA Volume:   15.10 ml 8.17 ml/m LA Vol (A4C):   34.1 ml 18.44 ml/m LA Biplane Vol: 33.6 ml 18.17 ml/m  AORTIC VALVE LVOT Vmax:   132.00 cm/s LVOT Vmean:  91.800 cm/s LVOT VTI:    0.248 m  AORTA Ao Root diam: 3.10 cm MITRAL VALVE MV Area (PHT): 4.36 cm     SHUNTS MV Decel Time: 174 msec     Systemic VTI:  0.25 m MV E velocity: 71.00 cm/s   Systemic Diam: 1.80 cm MV A velocity: 113.00 cm/s MV E/A ratio:  0.63 Adrian Prows MD Electronically signed by Adrian Prows MD Signature Date/Time: 12/21/2019/7:35:14 AM    Final    VAS [place] LOWER EXTREMITY VENOUS (DVT) (ONLY [facility] & [facility])  Result Date: 12/20/2019  Lower Venous DVTStudy Indications: Edema.  Comparison Study: no prior Performing Technologist: June Leap RDMS, RVT  Examination Guidelines: A complete evaluation includes B-mode  imaging, spectral Doppler, color Doppler, and power Doppler as needed of all accessible portions of each vessel. Bilateral testing is considered an integral part of a complete examination. Limited examinations for reoccurring indications may be performed as noted. The reflux portion of the exam is performed with the patient in reverse Trendelenburg.  +---------+---------------+---------+-----------+----------+--------------+ RIGHT    CompressibilityPhasicitySpontaneityPropertiesThrombus Aging +---------+---------------+---------+-----------+----------+--------------+ CFV      Full           Yes      Yes                                 +---------+---------------+---------+-----------+----------+--------------+ SFJ      Full                                                        +---------+---------------+---------+-----------+----------+--------------+ FV Prox  Full                                                        +---------+---------------+---------+-----------+----------+--------------+ FV Mid   Full                                                        +---------+---------------+---------+-----------+----------+--------------+  FV DistalFull                                                        +---------+---------------+---------+-----------+----------+--------------+ PFV      Full                                                        +---------+---------------+---------+-----------+----------+--------------+ POP      Full           Yes      Yes                                 +---------+---------------+---------+-----------+----------+--------------+ PTV      Full                                                        +---------+---------------+---------+-----------+----------+--------------+ PERO     Full                                                        +---------+---------------+---------+-----------+----------+--------------+    +---------+---------------+---------+-----------+----------+--------------+ LEFT     CompressibilityPhasicitySpontaneityPropertiesThrombus Aging +---------+---------------+---------+-----------+----------+--------------+ CFV      Full           Yes      Yes                                 +---------+---------------+---------+-----------+----------+--------------+ SFJ      Full                                                        +---------+---------------+---------+-----------+----------+--------------+ FV Prox  Full                                                        +---------+---------------+---------+-----------+----------+--------------+ FV Mid   Full                                                        +---------+---------------+---------+-----------+----------+--------------+ FV DistalFull                                                        +---------+---------------+---------+-----------+----------+--------------+  PFV      Full                                                        +---------+---------------+---------+-----------+----------+--------------+ POP      Full           Yes      Yes                                 +---------+---------------+---------+-----------+----------+--------------+ PTV      Full                                                        +---------+---------------+---------+-----------+----------+--------------+ PERO     Full                                                        +---------+---------------+---------+-----------+----------+--------------+     Summary: RIGHT: - There is no evidence of deep vein thrombosis in the lower extremity.  - No cystic structure found in the popliteal fossa.  LEFT: - There is no evidence of deep vein thrombosis in the lower extremity.  - No cystic structure found in the popliteal fossa.  *See table(s) above for measurements and observations. Electronically signed  by Servando Snare MD on 12/20/2019 at 8:24:18 PM.    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   1. Does the need for close, 24 hr/day medical supervision in concert with the patient's rehab needs make it unreasonable for this patient to be served in a less intensive setting? Yes 2. Co-Morbidities requiring supervision/potential complications: myasthenia gravis, DM- A1c 9.1, HTN, previous thyroid CA, , OSA, CPAP 3. Due to bladder management, bowel management, skin/wound care, disease management, medication administration, pain management and patient education, does the patient require 24 hr/day rehab nursing? Yes 4. Does the patient require coordinated care of a physician, rehab nurse, therapy disciplines of PT, OT to address physical and functional deficits in the context of the above medical diagnosis(es)? Yes Addressing deficits in the following areas: balance, endurance, locomotion, strength, transferring, bathing, dressing, feeding, grooming and toileting 5. Can the patient actively participate in an intensive therapy program of at least 3 hrs of therapy per day at least 5 days per week? Yes 6. The potential for patient to make measurable gains while on inpatient rehab is excellent 7. Anticipated functional outcomes upon discharge from inpatient rehab are modified independent  with PT, modified independent with OT, n/a with SLP. 8. Estimated rehab length of stay to reach the above functional goals is: 10-14 days 9. Anticipated discharge destination: Home 10. Overall Rehab/Functional Prognosis: excellent  RECOMMENDATIONS: This patient's condition is appropriate for continued rehabilitative care in the following setting: CIR Patient has agreed to participate in recommended program. Potentially Note that insurance prior authorization may be required for  reimbursement for recommended  care.  Comment:  1. Pt has R hemiparesis due to L thalamic CVA with sensory loss in R side, esp R hand and leg 2. Pt reports has 7 siblings, but "they all work" so no one can help except intermittent assistance.  3. Currently is min assist walking short distances with RW -max 30 ft.  4. Might suggest lidoderm patch for L chest pain- 12 hrs on;12 hrs off- 5. Pt is very appropriate for CIR- with goals of Mod I at 10-14 days.  6. Medical issues per primary team.  7. Thank you for this consult.      Lavon Paganini Angiulli, PA-C 12/21/2019    I have personally performed a face to face diagnostic evaluation of this patient and formulated the key components of the plan.  Additionally, I have personally reviewed laboratory data, imaging studies, as well as relevant notes and concur with the physician assistant's documentation above.

## 2019-12-21 NOTE — Progress Notes (Signed)
PROGRESS NOTE    [name]  [id number] DOB: April 09, 1951 DOA: 12/19/2019 PCP: [name], DO    Brief Narrative:  69 year old female with extensive medical issues including type 2 diabetes on insulin pump, myasthenia gravis, thyroid cancer status post resection, obstructive sleep apnea on CPAP at night, hypertension, hyperlipidemia, carotid stenosis who presents to the hospital with right face and right arm weakness and numbness as well as chest discomfort.  Initially evaluated in the ER for chest pain, VQ scan was negative.  Ruled out of acute coronary syndrome.  Also found to have right sided hemiparesis and acute stroke.   Assessment & Plan:   Principal Problem:   Thalamic stroke (Post) Active Problems:   Myasthenia gravis ([place])   Essential hypertension, benign   DM (diabetes mellitus) type II uncontrolled with eye manifestation (HCC)   OSA (obstructive sleep apnea)   Cancer of thyroid (HCC)   Asymptomatic carotid artery stenosis, bilateral   Hypercholesterolemia  Chest pain: Atypical.  Acute coronary syndrome ruled out.  On aspirin Plavix now.  Acute left thalamic stroke: Clinical findings, right facial sensory loss, right upper extremity motor and sensory loss. MRI of the brain, left thalamic stroke, acute ischemic stroke suspected small vessel disease. Carotid Doppler, known coronary artery disease, not contributing to current stroke. 2D echocardiogram, normal ejection fraction.  No source of emboli. Antiplatelet therapy, none at home.  Started on aspirin and Plavix for 3 weeks, then aspirin alone. LDL, 101.  Already on a statin.   Hemoglobin A1c, 9.8.  She has a insulin pump, has endocrinologist who is gradually working her up for insulin doses. DVT prophylaxis, Lovenox subcu Therapy recommendations, acute inpatient rehab  Hypertension: Allow permissive hypertension.  Holding Benicar and Aldactone.  Hyperlipidemia: Continue Crestor and Zetia.  LDL 100.  Type 2  diabetes with hyperglycemia, uncontrolled: Working with endocrinologist to control her blood sugars.  In the hospital, she remains on sliding scale insulin, will add long-acting insulin as she is not able to use insulin pump.  Myasthenia gravis: Patient on maintenance Rituxan injection every 2 weeks that was she will keep up outpatient.  Patient on prednisone and CellCept that she will continue.    DVT prophylaxis: Lovenox subcu Code Status: Full code Family Communication: None Disposition Plan: Status is: Inpatient  Remains inpatient appropriate because:Inpatient level of care appropriate due to severity of illness   Dispo: The patient is from: Home              Anticipated d/c is to: CIR              Anticipated d/c date is: 1 day              Patient currently is medically stable to d/c.          Consultants:   Neurology  Procedures:   None  Antimicrobials:   None   Subjective: Patient seen and examined.  Still has dull anterior chest wall discomfort.  She feels numb on the right side of face, she feels her right hand is thick due to loss of sensation.  No trouble swallowing.  Objective: Vitals:   12/21/19 0408 12/21/19 0900 12/21/19 1254 12/21/19 1308  BP:  (!) 157/69 (!) 143/63   Pulse:  96 (!) 109 (!) 103  Resp:      Temp:  97.9 F (36.6 C) 98.3 F (36.8 C)   TempSrc:  Oral Oral   SpO2: 100% 95% 97% 98%  Weight:  Height:        Intake/Output Summary (Last 24 hours) at 12/21/2019 1423 Last data filed at 12/21/2019 0600 Gross per 24 hour  Intake 1058.9 ml  Output --  Net 1058.9 ml   Filed Weights   12/19/19 1034  Weight: 81.6 kg    Examination:  General exam: Appears calm and comfortable  Respiratory system: Clear to auscultation. Respiratory effort normal. Cardiovascular system: S1 & S2 heard, RRR. No JVD, murmurs, rubs, gallops or clicks. Gastrointestinal system: Abdomen is nondistended, soft and nontender. No organomegaly or masses  felt. Normal bowel sounds heard. Central nervous system: Alert and oriented. No focal neurological deficits. Extremities: Superficial sensation loss on the right side of the face. Right upper extremity 4/5.  Wrist normal. Skin: No rashes, lesions or ulcers Psychiatry: Judgement and insight appear normal. Mood & affect appropriate.     Data Reviewed: I have personally reviewed following labs and imaging studies  CBC: Recent Labs  Lab 12/19/19 1141  WBC 6.7  HGB 11.8*  HCT 36.2  MCV 94.3  PLT 0000000   Basic Metabolic Panel: Recent Labs  Lab 12/19/19 1141  NA 140  K 3.3*  CL 106  CO2 24  GLUCOSE 124*  BUN 20  CREATININE 0.69  CALCIUM 10.1   GFR: Estimated Creatinine Clearance: 70 mL/min (by C-G formula based on SCr of 0.69 mg/dL). Liver Function Tests: Recent Labs  Lab 12/19/19 1141  AST 22  ALT 24  ALKPHOS 98  BILITOT 0.5  PROT 7.8  ALBUMIN 3.8   No results for input(s): LIPASE, AMYLASE in the last 168 hours. No results for input(s): AMMONIA in the last 168 hours. Coagulation Profile: No results for input(s): INR, PROTIME in the last 168 hours. Cardiac Enzymes: No results for input(s): CKTOTAL, CKMB, CKMBINDEX, TROPONINI in the last 168 hours. BNP (last 3 results) No results for input(s): PROBNP in the last 8760 hours. HbA1C: Recent Labs    12/20/19 1823  HGBA1C 9.1*   CBG: Recent Labs  Lab 12/20/19 1125 12/20/19 2124 12/21/19 0310 12/21/19 0605 12/21/19 1126  GLUCAP 310* 273* 202* 301* 351*   Lipid Profile: Recent Labs    12/21/19 0618  CHOL 193  HDL 45  LDLCALC 101*  TRIG 237*  CHOLHDL 4.3   Thyroid Function Tests: Recent Labs    12/20/19 1823  TSH 0.991   Anemia Panel: No results for input(s): VITAMINB12, FOLATE, FERRITIN, TIBC, IRON, RETICCTPCT in the last 72 hours. Sepsis Labs: No results for input(s): PROCALCITON, LATICACIDVEN in the last 168 hours.  Recent Results (from the past 240 hour(s))  Respiratory Panel by RT PCR  (Flu A&B, Covid) - Nasopharyngeal Swab     Status: None   Collection Time: 12/20/19  3:00 PM   Specimen: Nasopharyngeal Swab  Result Value Ref Range Status   SARS Coronavirus 2 by RT PCR NEGATIVE NEGATIVE Final    Comment: (NOTE) SARS-CoV-2 target nucleic acids are NOT DETECTED. The SARS-CoV-2 RNA is generally detectable in upper respiratoy specimens during the acute phase of infection. The lowest concentration of SARS-CoV-2 viral copies this assay can detect is 131 copies/mL. A negative result does not preclude SARS-Cov-2 infection and should not be used as the sole basis for treatment or other patient management decisions. A negative result may occur with  improper specimen collection/handling, submission of specimen other than nasopharyngeal swab, presence of viral mutation(s) within the areas targeted by this assay, and inadequate number of viral copies (<131 copies/mL). A negative result must be combined  with clinical observations, patient history, and epidemiological information. The expected result is Negative. Fact Sheet for Patients:  PinkCheek.be Fact Sheet for Healthcare Providers:  GravelBags.it This test is not yet ap proved or cleared by the [place] FDA and  has been authorized for detection and/or diagnosis of SARS-CoV-2 by FDA under an Emergency Use Authorization (EUA). This EUA will remain  in effect (meaning this test can be used) for the duration of the COVID-19 declaration under Section 564(b)(1) of the Act, 21 U.S.C. section 360bbb-3(b)(1), unless the authorization is terminated or revoked sooner.    Influenza A by PCR NEGATIVE NEGATIVE Final   Influenza B by PCR NEGATIVE NEGATIVE Final    Comment: (NOTE) The Xpert Xpress SARS-CoV-2/FLU/RSV assay is intended as an aid in  the diagnosis of influenza from Nasopharyngeal swab specimens and  should not be used as a sole basis for treatment. Nasal  washings and  aspirates are unacceptable for Xpert Xpress SARS-CoV-2/FLU/RSV  testing. Fact Sheet for Patients: PinkCheek.be Fact Sheet for Healthcare Providers: GravelBags.it This test is not yet approved or cleared by the [place] FDA and  has been authorized for detection and/or diagnosis of SARS-CoV-2 by  FDA under an Emergency Use Authorization (EUA). This EUA will remain  in effect (meaning this test can be used) for the duration of the  Covid-19 declaration under Section 564(b)(1) of the Act, 21  U.S.C. section 360bbb-3(b)(1), unless the authorization is  terminated or revoked. Performed at [facility], [place] [address] [place], [place] [address]          Radiology Studies: DG Chest 2 View  Result Date: 12/19/2019 CLINICAL DATA:  Left-sided chest pain EXAM: CHEST - 2 VIEW COMPARISON:  06/14/2017 FINDINGS: Right-sided central venous port tip over the proximal right atrium. Low lung volumes. No consolidation, pleural effusion or pneumothorax. Borderline to mild cardiomegaly. Aortic atherosclerosis. No pneumothorax. IMPRESSION: Low lung volumes. Borderline to mild cardiomegaly. Electronically Signed   By: Donavan Foil M.D.   On: 12/19/2019 17:20   MR BRAIN WO CONTRAST  Result Date: 12/20/2019 CLINICAL DATA:  Neuro deficit, acute, stroke suspected. Additional history provided: Right facial numbness and right arm weakness, known history of carotid disease. EXAM: MRI HEAD WITHOUT CONTRAST TECHNIQUE: Multiplanar, multiecho pulse sequences of the brain and surrounding structures were obtained without intravenous contrast. COMPARISON:  Report from head CT 05/24/2000 (images unavailable). FINDINGS: Brain: There is a 7 mm focus of restricted diffusion within the left thalamus consistent with acute lacunar infarct (series 5, image 30). Corresponding T2/FLAIR hyperintensity at this site. No acute infarct is identified  elsewhere within the brain. No significant white matter disease for age. Cerebral volume is normal. No evidence of intracranial mass. No chronic intracranial blood products. No extra-axial fluid collection. No midline shift. Vascular: Expected proximal arterial flow voids. Skull and upper cervical spine: No focal marrow lesion. Sequela of prior cervical spine fusion with susceptibility artifact arising from spinal fusion hardware. Adjacent level disease at C2-C3 with a posterior disc osteophyte complex and ligamentum flavum hypertrophy contributing to suspected at least mild/moderate spinal canal stenosis. Sinuses/Orbits: Visualized orbits show no acute finding. Right maxillary sinus mucous retention cyst. Mild ethmoid and left sphenoid sinus mucosal thickening. Trace fluid within the bilateral mastoid air cells. 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 Otherwise unremarkable MRI appearance of the brain for age. Paranasal sinus disease as described. Trace bilateral mastoid effusions. Sequela of prior cervical fusion. C2-C3 adjacent level disease with suspected at least  mild/moderate spinal canal stenosis. Electronically Signed   By: Kellie Simmering DO   On: 12/20/2019 12:56   NM Pulmonary Perfusion  Result Date: 12/20/2019 CLINICAL DATA:  Left side chest pain, shortness of breath EXAM: NUCLEAR MEDICINE PERFUSION LUNG SCAN TECHNIQUE: Perfusion images were obtained in multiple projections after intravenous injection of radiopharmaceutical. Ventilation scans intentionally deferred if perfusion scan and chest x-ray adequate for interpretation during COVID 19 epidemic. RADIOPHARMACEUTICALS:  1.55 mCi Tc-50m MAA IV COMPARISON:  Chest x-ray 12/19/2019 FINDINGS: No perfusion defects seen to suggest pulmonary embolus. IMPRESSION: No evidence of pulmonary embolus. Electronically Signed   By: Rolm Baptise M.D.   On: 12/20/2019 08:16   MR ANGIO CHEST W WO CONTRAST  Result Date: 12/20/2019 CLINICAL DATA:   69 year old female with severe contrast allergy and acute chest pain concerning for possible dissection. EXAM: MRA CHEST WITH OR WITHOUT CONTRAST TECHNIQUE: Angiographic images of the chest were obtained using MRA technique with intravenous contrast. CONTRAST:  38mL GADAVIST GADOBUTROL 1 MMOL/ML IV SOLN COMPARISON:  None. FINDINGS: VASCULAR Aorta: Conventional 3 vessel arch anatomy. No evidence of dissection, aneurysm or acute thoracic aortic abnormality. Mildly irregular atherosclerotic plaque present in the descending thoracic aorta. No penetrating ulceration. Signal void in the proximal left subclavian artery over approximately 2 cm may represent an intermediate segment occlusion. Heart: The heart is normal in size. However, there is significant concentric hypertrophy of the ventricular myocardium. Pulmonary Arteries:  Normal in caliber.  No large PE. Other: Right IJ approach single-lumen power injectable port catheter is present. The catheter tip terminates at the superior cavoatrial junction. NON-VASCULAR Mediastinum: No mediastinal mass or adenopathy. Lungs/pleura. No focal signal abnormality or abnormal enhancement. No evidence of pleural effusion. Upper abdomen: 1.8 cm simple cyst in the upper pole of the right kidney. No acute abnormality within the upper abdomen. Musculoskeletal: No focal signal abnormality or abnormal enhancement. IMPRESSION: VASCULAR 1. No evidence of acute aortic dissection, aortic aneurysm or other acute vascular abnormality. 2. 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 3. Heterogeneous and irregular/ulcerated atherosclerotic plaque along the descending thoracic aorta. 4. Concentric hypertrophy of the left ventricular myocardium suggests chronic systolic hypertension. NON-VASCULAR 1. No acute abnormality within the visualized upper abdomen. 2. Simple right upper pole renal cyst. Electronically Signed   By:  Jacqulynn Cadet M.D.   On: 12/20/2019 15:01   ECHOCARDIOGRAM COMPLETE  Result Date: 12/21/2019    ECHOCARDIOGRAM REPORT   Patient Name:   [name] Date of Exam: 12/20/2019 Medical Rec #:  [id number]       Height:       63.0 in Accession #:    [id number]      Weight:       180.0 lb Date of Birth:  03/13/1951        BSA:          1.849 m Patient Age:    77 years        BP:           123/70 mmHg Patient Gender: F               HR:           100 bpm. Exam Location:  Inpatient Procedure: 2D Echo Indications:     stroke 434.91  History:         Patient has prior history of Echocardiogram examinations, most  recent 05/29/2015. Risk Factors:Hypertension, Diabetes,                  Dyslipidemia and Sleep Apnea.  Sonographer:     Rupert Referring Phys:  [place] Diagnosing Phys: Adrian Prows MD  Sonographer Comments: Image acquisition challenging due to respiratory motion. IMPRESSIONS  1. Left ventricular ejection fraction, by estimation, is 60 to 65%. The left ventricle has normal function. The left ventricle has no regional wall motion abnormalities. Left ventricular diastolic parameters are consistent with Grade I diastolic dysfunction (impaired relaxation).  2. Right ventricular systolic function is normal. The right ventricular size is normal.  3. Left atrial size was moderately dilated.  4. The mitral valve is grossly normal. Trivial mitral valve regurgitation. No evidence of mitral stenosis.  5. Mild calcification of the non coronary cusp.. The aortic valve is tricuspid. Aortic valve regurgitation is not visualized. No aortic stenosis is present.  6. The inferior vena cava is normal in size with greater than 50% respiratory variability, suggesting right atrial pressure of 3 mmHg. FINDINGS  Left Ventricle: Left ventricular ejection fraction, by estimation, is 60 to 65%. The left ventricle has normal function. The left ventricle has no regional wall motion abnormalities.  The left ventricular internal cavity size was small. There is no left ventricular hypertrophy. Left ventricular diastolic parameters are consistent with Grade I diastolic dysfunction (impaired relaxation). Normal left ventricular filling pressure. Right Ventricle: The right ventricular size is normal. No increase in right ventricular wall thickness. Right ventricular systolic function is normal. Left Atrium: Left atrial size was moderately dilated. Right Atrium: Right atrial size was normal in size. Pericardium: There is no evidence of pericardial effusion. Mitral Valve: The mitral valve is grossly normal. Mild to moderate mitral annular calcification. Trivial mitral valve regurgitation. No evidence of mitral valve stenosis. Tricuspid Valve: The tricuspid valve is normal in structure. Tricuspid valve regurgitation is not demonstrated. Aortic Valve: Mild calcification of the non coronary cusp. The aortic valve is tricuspid. Aortic valve regurgitation is not visualized. No aortic stenosis is present. There is mild calcification of the aortic valve. Pulmonic Valve: The pulmonic valve was not well visualized. Pulmonic valve regurgitation is trivial. Aorta: The aortic root is normal in size and structure. Venous: The inferior vena cava is normal in size with greater than 50% respiratory variability, suggesting right atrial pressure of 3 mmHg. IAS/Shunts: No atrial level shunt detected by color flow Doppler.  LEFT VENTRICLE PLAX 2D LVIDd:         3.20 cm  Diastology LVIDs:         2.00 cm  LV e' lateral:   7.94 cm/s LV PW:         1.20 cm  LV E/e' lateral: 8.9 LV IVS:        1.00 cm  LV e' medial:    5.87 cm/s LVOT diam:     1.80 cm  LV E/e' medial:  12.1 LV SV:         63 LV SV Index:   34 LVOT Area:     2.54 cm  RIGHT VENTRICLE RV S prime:     15.70 cm/s LEFT ATRIUM             Index       RIGHT ATRIUM          Index LA diam:        2.90 cm 1.57 cm/m  RA Area:     8.67 cm  LA Vol (A2C):   33.3 ml 18.01 ml/m RA  Volume:   15.10 ml 8.17 ml/m LA Vol (A4C):   34.1 ml 18.44 ml/m LA Biplane Vol: 33.6 ml 18.17 ml/m  AORTIC VALVE LVOT Vmax:   132.00 cm/s LVOT Vmean:  91.800 cm/s LVOT VTI:    0.248 m  AORTA Ao Root diam: 3.10 cm MITRAL VALVE MV Area (PHT): 4.36 cm     SHUNTS MV Decel Time: 174 msec     Systemic VTI:  0.25 m MV E velocity: 71.00 cm/s   Systemic Diam: 1.80 cm MV A velocity: 113.00 cm/s MV E/A ratio:  0.63 Adrian Prows MD Electronically signed by Adrian Prows MD Signature Date/Time: 12/21/2019/7:35:14 AM    Final    VAS [place] LOWER EXTREMITY VENOUS (DVT) (ONLY [facility] & [facility])  Result Date: 12/20/2019  Lower Venous DVTStudy Indications: Edema.  Comparison Study: no prior Performing Technologist: June Leap RDMS, RVT  Examination Guidelines: A complete evaluation includes B-mode imaging, spectral Doppler, color Doppler, and power Doppler as needed of all accessible portions of each vessel. Bilateral testing is considered an integral part of a complete examination. Limited examinations for reoccurring indications may be performed as noted. The reflux portion of the exam is performed with the patient in reverse Trendelenburg.  +---------+---------------+---------+-----------+----------+--------------+ RIGHT    CompressibilityPhasicitySpontaneityPropertiesThrombus Aging +---------+---------------+---------+-----------+----------+--------------+ CFV      Full           Yes      Yes                                 +---------+---------------+---------+-----------+----------+--------------+ SFJ      Full                                                        +---------+---------------+---------+-----------+----------+--------------+ FV Prox  Full                                                        +---------+---------------+---------+-----------+----------+--------------+ FV Mid   Full                                                         +---------+---------------+---------+-----------+----------+--------------+ FV DistalFull                                                        +---------+---------------+---------+-----------+----------+--------------+ PFV      Full                                                        +---------+---------------+---------+-----------+----------+--------------+ POP      Full  Yes      Yes                                 +---------+---------------+---------+-----------+----------+--------------+ PTV      Full                                                        +---------+---------------+---------+-----------+----------+--------------+ PERO     Full                                                        +---------+---------------+---------+-----------+----------+--------------+   +---------+---------------+---------+-----------+----------+--------------+ LEFT     CompressibilityPhasicitySpontaneityPropertiesThrombus Aging +---------+---------------+---------+-----------+----------+--------------+ CFV      Full           Yes      Yes                                 +---------+---------------+---------+-----------+----------+--------------+ SFJ      Full                                                        +---------+---------------+---------+-----------+----------+--------------+ FV Prox  Full                                                        +---------+---------------+---------+-----------+----------+--------------+ FV Mid   Full                                                        +---------+---------------+---------+-----------+----------+--------------+ FV DistalFull                                                        +---------+---------------+---------+-----------+----------+--------------+ PFV      Full                                                         +---------+---------------+---------+-----------+----------+--------------+ POP      Full           Yes      Yes                                 +---------+---------------+---------+-----------+----------+--------------+ PTV  Full                                                        +---------+---------------+---------+-----------+----------+--------------+ PERO     Full                                                        +---------+---------------+---------+-----------+----------+--------------+     Summary: RIGHT: - There is no evidence of deep vein thrombosis in the lower extremity.  - No cystic structure found in the popliteal fossa.  LEFT: - There is no evidence of deep vein thrombosis in the lower extremity.  - No cystic structure found in the popliteal fossa.  *See table(s) above for measurements and observations. Electronically signed by Servando Snare MD on 12/20/2019 at 8:24:18 PM.    Final         Scheduled Meds: . aspirin EC  81 mg Oral Daily  . Chlorhexidine Gluconate Cloth  6 each Topical Daily  . clopidogrel  75 mg Oral Daily  . cycloSPORINE  1 drop Both Eyes BID  . enoxaparin (LOVENOX) injection  40 mg Subcutaneous Q24H  . ezetimibe  10 mg Oral Daily  . insulin aspart  0-5 Units Subcutaneous QHS  . insulin aspart  0-9 Units Subcutaneous TID WC  . insulin detemir  12 Units Subcutaneous Daily  . levothyroxine  137 mcg Oral QAC breakfast  . mometasone-formoterol  2 puff Inhalation BID  . montelukast  10 mg Oral BH-q7a  . mycophenolate  1,500 mg Oral BID  . pantoprazole  40 mg Oral Daily  . predniSONE  10 mg Oral BH-q7a  . rosuvastatin  40 mg Oral QHS  . sodium chloride flush  10-40 mL Intracatheter Q12H   Continuous Infusions:   LOS: 1 day    Time spent: 30 minutes    Barb Merino, MD [facility] Pager [phone]

## 2019-12-21 NOTE — Progress Notes (Addendum)
STROKE TEAM PROGRESS NOTE   INTERVAL HISTORY Her RN is at the bedside. We discussed her MG diagnosis and treatment plan as well as new stroke and need for local neurology f/u if she desires. Currently she goes to [facility].  I personally reviewed history of presenting illness with the patient, electronic medical records and imaging films in PACS.  She presented with sudden onset of right face and hand paresthesias due to small left thalamic lacunar infarct on MRI.  Vitals:   12/20/19 2001 12/20/19 2358 12/21/19 0310 12/21/19 0408  BP: (!) 138/55 (!) 134/57 (!) 141/54   Pulse: (!) 107 97 93   Resp: 20 17 19    Temp: 99 F (37.2 C) 98.4 F (36.9 C) 98.2 F (36.8 C)   TempSrc: Oral     SpO2: 99% 95% 97% 100%  Weight:      Height:        CBC:  Recent Labs  Lab 12/19/19 1141  WBC 6.7  HGB 11.8*  HCT 36.2  MCV 94.3  PLT 0000000    Basic Metabolic Panel:  Recent Labs  Lab 12/19/19 1141  NA 140  K 3.3*  CL 106  CO2 24  GLUCOSE 124*  BUN 20  CREATININE 0.69  CALCIUM 10.1   Lipid Panel:     Component Value Date/Time   CHOL 193 12/21/2019 0618   CHOL 243 (H) 04/14/2019 1018   TRIG 237 (H) 12/21/2019 0618   HDL 45 12/21/2019 0618   HDL 67 04/14/2019 1018   CHOLHDL 4.3 12/21/2019 0618   VLDL 47 (H) 12/21/2019 0618   LDLCALC 101 (H) 12/21/2019 0618   LDLCALC 146 (H) 04/14/2019 1018   HgbA1c:  Lab Results  Component Value Date   HGBA1C 9.1 (H) 12/20/2019   Urine Drug Screen: No results found for: LABOPIA, COCAINSCRNUR, LABBENZ, AMPHETMU, THCU, LABBARB  Alcohol Level No results found for: ETH  IMAGING past 24 hours MR BRAIN WO CONTRAST  Result Date: 12/20/2019 CLINICAL DATA:  Neuro deficit, acute, stroke suspected. Additional history provided: Right facial numbness and right arm weakness, known history of carotid disease. EXAM: MRI HEAD WITHOUT CONTRAST TECHNIQUE: Multiplanar, multiecho pulse sequences of the brain and surrounding structures were obtained without intravenous  contrast. COMPARISON:  Report from head CT 05/24/2000 (images unavailable). FINDINGS: Brain: There is a 7 mm focus of restricted diffusion within the left thalamus consistent with acute lacunar infarct (series 5, image 30). Corresponding T2/FLAIR hyperintensity at this site. No acute infarct is identified elsewhere within the brain. No significant white matter disease for age. Cerebral volume is normal. No evidence of intracranial mass. No chronic intracranial blood products. No extra-axial fluid collection. No midline shift. Vascular: Expected proximal arterial flow voids. Skull and upper cervical spine: No focal marrow lesion. Sequela of prior cervical spine fusion with susceptibility artifact arising from spinal fusion hardware. Adjacent level disease at C2-C3 with a posterior disc osteophyte complex and ligamentum flavum hypertrophy contributing to suspected at least mild/moderate spinal canal stenosis. Sinuses/Orbits: Visualized orbits show no acute finding. Right maxillary sinus mucous retention cyst. Mild ethmoid and left sphenoid sinus mucosal thickening. Trace fluid within the bilateral mastoid air cells. 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 Otherwise unremarkable MRI appearance of the brain for age. Paranasal sinus disease as described. Trace bilateral mastoid effusions. Sequela of prior cervical fusion. C2-C3 adjacent level disease with suspected at least mild/moderate spinal canal stenosis. Electronically Signed   By: Kellie Simmering DO   On: 12/20/2019 12:56  MR ANGIO CHEST W WO CONTRAST  Result Date: 12/20/2019 CLINICAL DATA:  69 year old female with severe contrast allergy and acute chest pain concerning for possible dissection. EXAM: MRA CHEST WITH OR WITHOUT CONTRAST TECHNIQUE: Angiographic images of the chest were obtained using MRA technique with intravenous contrast. CONTRAST:  71mL GADAVIST GADOBUTROL 1 MMOL/ML IV SOLN COMPARISON:  None. FINDINGS: VASCULAR Aorta:  Conventional 3 vessel arch anatomy. No evidence of dissection, aneurysm or acute thoracic aortic abnormality. Mildly irregular atherosclerotic plaque present in the descending thoracic aorta. No penetrating ulceration. Signal void in the proximal left subclavian artery over approximately 2 cm may represent an intermediate segment occlusion. Heart: The heart is normal in size. However, there is significant concentric hypertrophy of the ventricular myocardium. Pulmonary Arteries:  Normal in caliber.  No large PE. Other: Right IJ approach single-lumen power injectable port catheter is present. The catheter tip terminates at the superior cavoatrial junction. NON-VASCULAR Mediastinum: No mediastinal mass or adenopathy. Lungs/pleura. No focal signal abnormality or abnormal enhancement. No evidence of pleural effusion. Upper abdomen: 1.8 cm simple cyst in the upper pole of the right kidney. No acute abnormality within the upper abdomen. Musculoskeletal: No focal signal abnormality or abnormal enhancement. IMPRESSION: VASCULAR 1. No evidence of acute aortic dissection, aortic aneurysm or other acute vascular abnormality. 2. 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 3. Heterogeneous and irregular/ulcerated atherosclerotic plaque along the descending thoracic aorta. 4. Concentric hypertrophy of the left ventricular myocardium suggests chronic systolic hypertension. NON-VASCULAR 1. No acute abnormality within the visualized upper abdomen. 2. Simple right upper pole renal cyst. Electronically Signed   By: Jacqulynn Cadet M.D.   On: 12/20/2019 15:01   ECHOCARDIOGRAM COMPLETE  Result Date: 12/21/2019    ECHOCARDIOGRAM REPORT   Patient Name:   [name] Date of Exam: 12/20/2019 Medical Rec #:  [id number]       Height:       63.0 in Accession #:    [id number]      Weight:       180.0 lb Date of Birth:  03-20-1951        BSA:          1.849 m  Patient Age:    69 years        BP:           123/70 mmHg Patient Gender: F               HR:           100 bpm. Exam Location:  Inpatient Procedure: 2D Echo Indications:     stroke 434.91  History:         Patient has prior history of Echocardiogram examinations, most                  recent 05/29/2015. Risk Factors:Hypertension, Diabetes,                  Dyslipidemia and Sleep Apnea.  Sonographer:     Welcome Referring Phys:  [place] Diagnosing Phys: Adrian Prows MD  Sonographer Comments: Image acquisition challenging due to respiratory motion. IMPRESSIONS  1. Left ventricular ejection fraction, by estimation, is 60 to 65%. The left ventricle has normal function. The left ventricle has no regional wall motion abnormalities. Left ventricular diastolic parameters are consistent with Grade I diastolic dysfunction (impaired relaxation).  2. Right ventricular systolic function is normal. The right ventricular size  is normal.  3. Left atrial size was moderately dilated.  4. The mitral valve is grossly normal. Trivial mitral valve regurgitation. No evidence of mitral stenosis.  5. Mild calcification of the non coronary cusp.. The aortic valve is tricuspid. Aortic valve regurgitation is not visualized. No aortic stenosis is present.  6. The inferior vena cava is normal in size with greater than 50% respiratory variability, suggesting right atrial pressure of 3 mmHg. FINDINGS  Left Ventricle: Left ventricular ejection fraction, by estimation, is 60 to 65%. The left ventricle has normal function. The left ventricle has no regional wall motion abnormalities. The left ventricular internal cavity size was small. There is no left ventricular hypertrophy. Left ventricular diastolic parameters are consistent with Grade I diastolic dysfunction (impaired relaxation). Normal left ventricular filling pressure. Right Ventricle: The right ventricular size is normal. No increase in right ventricular wall  thickness. Right ventricular systolic function is normal. Left Atrium: Left atrial size was moderately dilated. Right Atrium: Right atrial size was normal in size. Pericardium: There is no evidence of pericardial effusion. Mitral Valve: The mitral valve is grossly normal. Mild to moderate mitral annular calcification. Trivial mitral valve regurgitation. No evidence of mitral valve stenosis. Tricuspid Valve: The tricuspid valve is normal in structure. Tricuspid valve regurgitation is not demonstrated. Aortic Valve: Mild calcification of the non coronary cusp. The aortic valve is tricuspid. Aortic valve regurgitation is not visualized. No aortic stenosis is present. There is mild calcification of the aortic valve. Pulmonic Valve: The pulmonic valve was not well visualized. Pulmonic valve regurgitation is trivial. Aorta: The aortic root is normal in size and structure. Venous: The inferior vena cava is normal in size with greater than 50% respiratory variability, suggesting right atrial pressure of 3 mmHg. IAS/Shunts: No atrial level shunt detected by color flow Doppler.  LEFT VENTRICLE PLAX 2D LVIDd:         3.20 cm  Diastology LVIDs:         2.00 cm  LV e' lateral:   7.94 cm/s LV PW:         1.20 cm  LV E/e' lateral: 8.9 LV IVS:        1.00 cm  LV e' medial:    5.87 cm/s LVOT diam:     1.80 cm  LV E/e' medial:  12.1 LV SV:         63 LV SV Index:   34 LVOT Area:     2.54 cm  RIGHT VENTRICLE RV S prime:     15.70 cm/s LEFT ATRIUM             Index       RIGHT ATRIUM          Index LA diam:        2.90 cm 1.57 cm/m  RA Area:     8.67 cm LA Vol (A2C):   33.3 ml 18.01 ml/m RA Volume:   15.10 ml 8.17 ml/m LA Vol (A4C):   34.1 ml 18.44 ml/m LA Biplane Vol: 33.6 ml 18.17 ml/m  AORTIC VALVE LVOT Vmax:   132.00 cm/s LVOT Vmean:  91.800 cm/s LVOT VTI:    0.248 m  AORTA Ao Root diam: 3.10 cm MITRAL VALVE MV Area (PHT): 4.36 cm     SHUNTS MV Decel Time: 174 msec     Systemic VTI:  0.25 m MV E velocity: 71.00 cm/s    Systemic Diam: 1.80 cm MV A velocity: 113.00 cm/s MV E/A ratio:  0.63  Adrian Prows MD Electronically signed by Adrian Prows MD Signature Date/Time: 12/21/2019/7:35:14 AM    Final    VAS [place] LOWER EXTREMITY VENOUS (DVT) (ONLY [facility] & [facility])  Result Date: 12/20/2019  Lower Venous DVTStudy Indications: Edema.  Comparison Study: no prior Performing Technologist: June Leap RDMS, RVT  Examination Guidelines: A complete evaluation includes B-mode imaging, spectral Doppler, color Doppler, and power Doppler as needed of all accessible portions of each vessel. Bilateral testing is considered an integral part of a complete examination. Limited examinations for reoccurring indications may be performed as noted. The reflux portion of the exam is performed with the patient in reverse Trendelenburg.  +---------+---------------+---------+-----------+----------+--------------+ RIGHT    CompressibilityPhasicitySpontaneityPropertiesThrombus Aging +---------+---------------+---------+-----------+----------+--------------+ CFV      Full           Yes      Yes                                 +---------+---------------+---------+-----------+----------+--------------+ SFJ      Full                                                        +---------+---------------+---------+-----------+----------+--------------+ FV Prox  Full                                                        +---------+---------------+---------+-----------+----------+--------------+ FV Mid   Full                                                        +---------+---------------+---------+-----------+----------+--------------+ FV DistalFull                                                        +---------+---------------+---------+-----------+----------+--------------+ PFV      Full                                                        +---------+---------------+---------+-----------+----------+--------------+ POP      Full            Yes      Yes                                 +---------+---------------+---------+-----------+----------+--------------+ PTV      Full                                                        +---------+---------------+---------+-----------+----------+--------------+  PERO     Full                                                        +---------+---------------+---------+-----------+----------+--------------+   +---------+---------------+---------+-----------+----------+--------------+ LEFT     CompressibilityPhasicitySpontaneityPropertiesThrombus Aging +---------+---------------+---------+-----------+----------+--------------+ CFV      Full           Yes      Yes                                 +---------+---------------+---------+-----------+----------+--------------+ SFJ      Full                                                        +---------+---------------+---------+-----------+----------+--------------+ FV Prox  Full                                                        +---------+---------------+---------+-----------+----------+--------------+ FV Mid   Full                                                        +---------+---------------+---------+-----------+----------+--------------+ FV DistalFull                                                        +---------+---------------+---------+-----------+----------+--------------+ PFV      Full                                                        +---------+---------------+---------+-----------+----------+--------------+ POP      Full           Yes      Yes                                 +---------+---------------+---------+-----------+----------+--------------+ PTV      Full                                                        +---------+---------------+---------+-----------+----------+--------------+ PERO     Full                                                         +---------+---------------+---------+-----------+----------+--------------+  Summary: RIGHT: - There is no evidence of deep vein thrombosis in the lower extremity.  - No cystic structure found in the popliteal fossa.  LEFT: - There is no evidence of deep vein thrombosis in the lower extremity.  - No cystic structure found in the popliteal fossa.  *See table(s) above for measurements and observations. Electronically signed by Servando Snare MD on 12/20/2019 at 8:24:18 PM.    Final     PHYSICAL EXAM Constitutional: Pleasant middle-aged African-American lady who appears well-developed and well-nourished.  Eyes: No scleral injection, exophthalmos HENT: No OP obstrucion Head: Normocephalic.  Cardiovascular: Normal rate and regular rhythm.  Respiratory: Effort normal, non-labored breathing GI: Soft. No distension. There is no tenderness.  Skin: WDI  Neuro: Mental Status: Patient is awake, alert, oriented to person, place, month, year, and situation. Speech-clear, intact naming, repeating and comprehension.  Able to follow commands with no difficulties.  Able to give a clear history. Cranial Nerves: II: Visual Fields are full.  III,IV, VI: EOMI without ptosis or diploplia. Pupils equal, round and reactive to light V: Facial sensation decreased on the right VII: Facial movement is symmetric.  VIII: hearing is intact to voice X: Palat elevates symmetrically XI: Shoulder shrug is symmetric. XII: tongue is midline without atrophy or fasciculations.  Motor: 4/5 strength with the right shoulder, right arm flexion and extension.  Otherwise 5/5 No drift Sensory: Sensation is symmetric to light touch and temperature in the arms and legs. Deep Tendon Reflexes: Depressed throughout Plantars: Toes are downgoing bilaterally.  Cerebellar: FNF and HKS are intact bilaterally  ASSESSMENT/PLAN Ms. NIERRA SCHOLLE is a 69 y.o. female presenting with left facial numbness for 3 days along with  shortness of breath and some chest pain PTA.  During work-up for her ailments, an MRI was obtained which showed a left thalamic infarct. Although she does have bilateral carotid artery stenosis this would be a small vessel infarct and not related to patient's carotid artery stenosis.  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 Will need out pt vascular f/u to monitor this  2D Echo pending  LDL 101  HgbA1c 9.1  Lovenox for VTE prophylaxis    Diet   Diet heart healthy/carb modified Room service appropriate? Yes; Fluid consistency: Thin     None prior to admission, now on ASA + Plavix x 3wks, then monotherapy with ASA only  Therapy recommendations:  pending  Disposition:  pending  Hypertension  Home meds:  Aldactone  Stable . No role in Permissive hypertension  . Long-term BP goal normotensive  Hyperlipidemia  Home meds:  Crestor 40mg , resumed in hospital  LDL 101, goal < 70  Continue statin at discharge  Diabetes type II Uncontrolled  Home meds:  Insulin,   HgbA1c 9.1, goal < 7.0  CBGs Recent Labs    12/20/19 2124 12/21/19 0310 12/21/19 0605  GLUCAP 273* 202* 301*      SSI  Other Stroke Risk Factors  Advanced age  Obesity, Body mass index is 31.89 kg/m., recommend weight loss, diet and exercise as appropriate   Hx stroke/TIA  Other Active Problems  Thyroid cancer  Myasthenia gravis- on cellcept, IV Rituxan. Currently followed at [facility] Carotid artery stenosis bilaterally  Sleep Apnea, followed by Dr Brett Fairy ([place])  Hospital day # 1  Desiree Metzger-Cihelka, ARNP-C, ANVP-BC Pager: [phone] I have personally obtained history,examined this patient, reviewed notes, independently viewed imaging studies, participated in medical decision making  and plan of care.ROS completed by me personally and pertinent positives fully documented  I have made any additions or clarifications directly to the above note. Agree with note above.  Recommend aspirin and Plavix for 3 weeks followed by aspirin alone and aggressive risk factor modification.  Continue ongoing stroke work-up.  Continue treatment for myasthenia as per [facility] neurology.  Continue CPAP for sleep apnea.  Discussed with patient and Dr. Dante Gang.  Greater than 50% time during this 35-minute visit was spent on counseling and coordination of care about her lacunar stroke and myasthenia and answering questions.  Antony Contras, MD Medical Director [place] Pager: [phone] 12/21/2019 4:09 PM  To contact Stroke Continuity provider, please refer to [email or web address] After hours, contact General Neurology

## 2019-12-21 NOTE — Progress Notes (Addendum)
Inpatient Diabetes Program Recommendations  AACE/ADA: New Consensus Statement on Inpatient Glycemic Control (2015)  Target Ranges:  Prepandial:   less than 140 mg/dL      Peak postprandial:   less than 180 mg/dL (1-2 hours)      Critically ill patients:  140 - 180 mg/dL   Lab Results  Component Value Date   GLUCAP 301 (H) 12/21/2019   HGBA1C 9.1 (H) 12/20/2019    Review of Glycemic Control Results for [name], [name] (MRN [id number]) as of 12/21/2019 09:17  Ref. Range 12/20/2019 09:34 12/20/2019 11:25 12/20/2019 21:24 12/21/2019 03:10 12/21/2019 06:05  Glucose-Capillary Latest Ref Range: 70 - 99 mg/dL 289 (H) 310 (H) 273 (H) 202 (H) 301 (H)   Diabetes history: DM Outpatient Diabetes medications: Omnipod insulin pump Current orders for Inpatient glycemic control:  Novolog 0-9 units tid + hs PO prednisone 10 mg Daily  Inpatient Diabetes Program Recommendations:     Per RN in ED yesterday pt had taken Omnipod insulin pump off.  If on insulin pump at home will need basal insulin. Glucose trends 300's this am.  Consider Lantus 12 units.  Will see pt today to get insulin pump settings.   Addendum 1138 am: Spoke with pt at bedside regarding insulin pump. Pt reports her sister has her insulin pump for now. Pt reports not being on enough insulin and that is why her glucose trends are high. Messaged Dr. Sloan Leiter regarding basal insulin. Orders placed. Called Dr. Almetta Lovely office, Pt's Endocrinologist for insulin pump settings. Waiting call back.  Thanks,  Tama Headings RN, MSN, BC-ADM Inpatient Diabetes Coordinator Team Pager [phone] (8a-5p)

## 2019-12-22 LAB — GLUCOSE, CAPILLARY
Glucose-Capillary: 279 mg/dL — ABNORMAL HIGH (ref 70–99)
Glucose-Capillary: 281 mg/dL — ABNORMAL HIGH (ref 70–99)
Glucose-Capillary: 297 mg/dL — ABNORMAL HIGH (ref 70–99)
Glucose-Capillary: 469 mg/dL — ABNORMAL HIGH (ref 70–99)
Glucose-Capillary: 403 mg/dL — ABNORMAL HIGH (ref 70–99)
Glucose-Capillary: 137 mg/dL — ABNORMAL HIGH (ref 70–99)
Glucose-Capillary: 130 mg/dL — ABNORMAL HIGH (ref 70–99)

## 2019-12-22 MED ORDER — LIDOCAINE 5 % EX PTCH
1.0000 | MEDICATED_PATCH | CUTANEOUS | Status: DC
  Administered 2019-12-22 – 2019-12-27 (×6): 1 via TRANSDERMAL
  Filled 2019-12-22 (×6): qty 1

## 2019-12-22 MED ORDER — INSULIN DETEMIR 100 UNIT/ML ~~LOC~~ SOLN: 13.0000 [IU] | Freq: Once | SUBCUTANEOUS | Status: DC

## 2019-12-22 MED ORDER — INSULIN DETEMIR 100 UNIT/ML ~~LOC~~ SOLN
20.0000 [IU] | Freq: Once | SUBCUTANEOUS | Status: AC
  Administered 2019-12-22: 20 [IU] via SUBCUTANEOUS
  Filled 2019-12-22: qty 0.2

## 2019-12-22 MED ORDER — INSULIN ASPART 100 UNIT/ML ~~LOC~~ SOLN
12.0000 [IU] | Freq: Three times a day (TID) | SUBCUTANEOUS | Status: DC
  Administered 2019-12-22 – 2019-12-25 (×11): 12 [IU] via SUBCUTANEOUS

## 2019-12-22 MED ORDER — INSULIN DETEMIR 100 UNIT/ML ~~LOC~~ SOLN
35.0000 [IU] | Freq: Every day | SUBCUTANEOUS | Status: DC
  Administered 2019-12-23 – 2019-12-26 (×4): 35 [IU] via SUBCUTANEOUS
  Filled 2019-12-22 (×5): qty 0.35

## 2019-12-22 NOTE — Social Work (Signed)
CSW met with pt at bedside. CSW introduced self and explained her role. CSW completed sbirt with pt.  Pt scored a 0 on the sbirt scale. Pt denied alcohol use. Pt denied substance use. Pt did not need resources at this time.  Emeterio Reeve, Latanya Presser, [place] Social Worker [phone]

## 2019-12-22 NOTE — Progress Notes (Signed)
Occupational Therapy Treatment Patient Details Name: [name] MRN: [id number] DOB: [birth year] Today's Date: 12/22/2019    History of present illness Pt is a 69 yo female presenting with c/o chest pain and R-sided face and arm weakness/numbness and tingling. Upon admission, imaging revealed acute L thalamic CVA. PMH includes: Myasthenia gravis, HTN, DM II, thyroid cancer, HCL, and OSA.   OT comments  Pt. Seen for skilled therapy session with PT for continued strength and safety during ADL completion.  Pt. Able to perform functional mobility with mod a (refer to PT notes for further details).  Focus on integration of RUE into all adl tasks.  Pt. Verbalizes understanding but requires multiple cues for carry over and continued use during tasks.  Mod a for toileting and hygiene.  Will benefit from intense CIR level therapies for increasing safety and independence with ADLS and mobility.  Also needs continued education, training, and support with CVA that CIR unit can provide.      Follow Up Recommendations  CIR;Supervision/Assistance - 24 hour    Equipment Recommendations  3 in 1 bedside commode    Recommendations for Other Services Rehab consult    Precautions / Restrictions Precautions Precautions: Fall       Mobility Bed Mobility Overal bed mobility: Needs Assistance Bed Mobility: Rolling;Sidelying to Sit Rolling: Min assist            Transfers Overall transfer level: Needs assistance Equipment used: None Transfers: Stand Pivot Transfers;Sit to/from Stand Sit to Stand: Mod assist;Min assist Stand pivot transfers: Min assist;Mod assist       General transfer comment: initial mod a. reports feeling unsteady and wanting to hold onto things. educated on not holding onto anything that will move or break (doors, soap dishes, towel racks)    Balance                                           ADL either performed or assessed with clinical judgement    ADL Overall ADL's : Needs assistance/impaired Eating/Feeding: Minimal assistance;Sitting Eating/Feeding Details (indicate cue type and reason): cues for integration and use of R hands into tasks (opening container, lids, using hand to hold utensitl to eat), intermittent assistance with more difficult packages Grooming: Wash/dry hands;Minimal assistance;Standing Grooming Details (indicate cue type and reason): multiple cues for use of R hand with inconsistent carry over         Upper Body Dressing : Minimal assistance;Sitting Upper Body Dressing Details (indicate cue type and reason): don gown and face mask. increased time for pt. to use R hand and use it for duration of tasks Lower Body Dressing: Minimal assistance Lower Body Dressing Details (indicate cue type and reason): To don/doff socks. Cues to incorporate use of RUE into task.  Toilet Transfer: Moderate assistance;Ambulation;Grab bars;Regular Museum/gallery exhibitions officer and Hygiene: Minimal assistance;Sitting/lateral lean;Sit to/from stand Toileting - Clothing Manipulation Details (indicate cue type and reason): s seated, heavy min a to stand and pull up underwear while using both hands to pull up     Functional mobility during ADLs: Moderate assistance General ADL Comments: no equipment used for amulation and functional mobility (refer to PT notes for more details on gait).  pt. requires multiple cues for use of R ue into any adl or functional task. cont. cues to cont. use of r ue once task initiated.  able to  integrate but inconistent with carry over "i keep going for it with my L hand ok ill try again"     Vision       Perception     Praxis      Cognition Arousal/Alertness: Awake/alert   Overall Cognitive Status: Within Functional Limits for tasks assessed                                          Exercises     Shoulder Instructions       General Comments  large family of 8  siblings. Brother is only one who lives out of town but is traveling from [place] to see her.     Pertinent Vitals/ Pain       Pain Assessment: Faces Faces Pain Scale: Hurts even more Pain Location: head ache after ambulation Pain Descriptors / Indicators: Aching Pain Intervention(s): Monitored during session;Limited activity within patient's tolerance  Home Living                                          Prior Functioning/Environment              Frequency  Min 3X/week        Progress Toward Goals  OT Goals(current goals can now be found in the care plan section)  Progress towards OT goals: Progressing toward goals     Plan Discharge plan remains appropriate    Co-evaluation                 AM-PAC OT "6 Clicks" Daily Activity     Outcome Measure   Help from another person eating meals?: A Little Help from another person taking care of personal grooming?: A Little Help from another person toileting, which includes using toliet, bedpan, or urinal?: A Little Help from another person bathing (including washing, rinsing, drying)?: A Little Help from another person to put on and taking off regular upper body clothing?: A Little Help from another person to put on and taking off regular lower body clothing?: A Little 6 Click Score: 18    End of Session Equipment Utilized During Treatment: Gait belt  OT Visit Diagnosis: Unsteadiness on feet (R26.81);Muscle weakness (generalized) (M62.81);Hemiplegia and hemiparesis Hemiplegia - Right/Left: Right Hemiplegia - dominant/non-dominant: Dominant Hemiplegia - caused by: Cerebral infarction   Activity Tolerance Patient tolerated treatment well   Patient Left in chair;with call bell/phone within reach;with chair alarm set;with family/visitor present   Nurse Communication          Time: 1109-1150 OT Time Calculation (min): 41 min  Charges: OT General Charges $OT Visit: 1 Visit OT  Treatments $Self Care/Home Management : 23-37 mins  Sonia Baller, COTA/L Acute Rehabilitation [phone]   Janice Coffin 12/22/2019, 12:11 PM

## 2019-12-22 NOTE — Progress Notes (Signed)
Patient refuses Respiratory Therapy medications, states she does not take them at home and will not be charged for them at the hospital. Pt also refuses CPAP.

## 2019-12-22 NOTE — Progress Notes (Signed)
PROGRESS NOTE    [name]  [id number] DOB: [birth year] DOA: 12/19/2019 PCP: [name], DO    Brief Narrative:  69 year old female with extensive medical issues including type 2 diabetes on insulin pump, myasthenia gravis, thyroid cancer status post resection, obstructive sleep apnea on CPAP at night, hypertension, hyperlipidemia, carotid stenosis who presents to the hospital with right face and right arm weakness and numbness as well as chest discomfort.  Initially evaluated in the ER for chest pain, VQ scan was negative.  Ruled out of acute coronary syndrome.  Also found to have right sided hemiparesis and acute stroke.   Assessment & Plan:   Principal Problem:   Thalamic stroke ([place]) Active Problems:   Myasthenia gravis ([place])   Essential hypertension, benign   DM (diabetes mellitus) type II uncontrolled with eye manifestation (HCC)   OSA (obstructive sleep apnea)   Cancer of thyroid (HCC)   Asymptomatic carotid artery stenosis, bilateral   Hypercholesterolemia  Chest pain: Atypical.  Acute coronary syndrome ruled out.  On aspirin Plavix now. She has palpable tenderness along the left costal margins and below breast line.  Will use lidocaine patches.  Acute left thalamic stroke: Clinical findings, right facial sensory loss, right upper extremity motor and sensory loss. MRI of the brain, left thalamic stroke, acute ischemic stroke suspected small vessel disease. Carotid Doppler, known coronary artery disease, not contributing to current stroke. 2D echocardiogram, normal ejection fraction.  No source of emboli. Antiplatelet therapy, none at home.  Started on aspirin and Plavix for 3 weeks, then aspirin alone. LDL, 101.  Already on a statin.   Hemoglobin A1c, 9.8.  She has an insulin pump, has endocrinologist who is gradually working her up for insulin doses. DVT prophylaxis, Lovenox subcu Therapy recommendations, acute inpatient rehab  Hypertension: Allow permissive  hypertension.  Holding Benicar and Aldactone.  Hyperlipidemia: Continue Crestor and Zetia.  LDL 100.  Type 2 diabetes with hyperglycemia, uncontrolled: Working with endocrinologist to control her blood sugars.  In the hospital, she remains on sliding scale insulin, added long-acting insulin.  Myasthenia gravis: Patient on maintenance Rituxan injection every 2 weeks that was she will keep up outpatient.  Patient on prednisone and CellCept that she will continue.    DVT prophylaxis: Lovenox subcu Code Status: Full code Family Communication: None Disposition Plan: Status is: Inpatient  Remains inpatient appropriate because:Inpatient level of care appropriate due to severity of illness   Dispo: The patient is from: Home              Anticipated d/c is to: CIR              Anticipated d/c date is: 1 day              Patient currently is medically stable to d/c.          Consultants:   Neurology  Procedures:   None  Antimicrobials:   None   Subjective: Seen and examined.  Worried about her numbness on the right face. Coastal chest pain on the left side remains.  No other overnight events.  Objective: Vitals:   12/21/19 2300 12/22/19 0015 12/22/19 0324 12/22/19 0700  BP:  140/67 (!) 136/53 (!) 142/65  Pulse: 96 92 90 90  Resp: 20 16 18 16   Temp:  98.6 F (37 C) 97.9 F (36.6 C) 97.9 F (36.6 C)  TempSrc:  Oral Oral Oral  SpO2: 98% 97% 97% 100%  Weight:      Height:  Intake/Output Summary (Last 24 hours) at 12/22/2019 1112 Last data filed at 12/22/2019 0935 Gross per 24 hour  Intake 390 ml  Output --  Net 390 ml   Filed Weights   12/19/19 1034  Weight: 81.6 kg    Examination:  General exam: Appears calm and comfortable  Respiratory system: Clear to auscultation. Respiratory effort normal. Cardiovascular system: S1 & S2 heard, RRR. No JVD, murmurs, rubs, gallops or clicks Palpable tenderness along the left costal line, below breast on the  ribs. Gastrointestinal system: Abdomen is nondistended, soft and nontender. No organomegaly or masses felt. Normal bowel sounds heard. Central nervous system: Alert and oriented. No focal neurological deficits. Extremities: Superficial sensation loss on the right side of the face. Right upper extremity and lower extremity 4/5.   Skin: No rashes, lesions or ulcers Psychiatry: Judgement and insight appear normal. Mood & affect appropriate.     Data Reviewed: I have personally reviewed following labs and imaging studies  CBC: Recent Labs  Lab 12/19/19 1141  WBC 6.7  HGB 11.8*  HCT 36.2  MCV 94.3  PLT 0000000   Basic Metabolic Panel: Recent Labs  Lab 12/19/19 1141  NA 140  K 3.3*  CL 106  CO2 24  GLUCOSE 124*  BUN 20  CREATININE 0.69  CALCIUM 10.1   GFR: Estimated Creatinine Clearance: 70 mL/min (by C-G formula based on SCr of 0.69 mg/dL). Liver Function Tests: Recent Labs  Lab 12/19/19 1141  AST 22  ALT 24  ALKPHOS 98  BILITOT 0.5  PROT 7.8  ALBUMIN 3.8   No results for input(s): LIPASE, AMYLASE in the last 168 hours. No results for input(s): AMMONIA in the last 168 hours. Coagulation Profile: No results for input(s): INR, PROTIME in the last 168 hours. Cardiac Enzymes: No results for input(s): CKTOTAL, CKMB, CKMBINDEX, TROPONINI in the last 168 hours. BNP (last 3 results) No results for input(s): PROBNP in the last 8760 hours. HbA1C: Recent Labs    12/20/19 1823  HGBA1C 9.1*   CBG: Recent Labs  Lab 12/21/19 1607 12/21/19 2142 12/22/19 0318 12/22/19 0611 12/22/19 0831  GLUCAP 305* 335* 279* 281* 297*   Lipid Profile: Recent Labs    12/21/19 0618  CHOL 193  HDL 45  LDLCALC 101*  TRIG 237*  CHOLHDL 4.3   Thyroid Function Tests: Recent Labs    12/20/19 1823  TSH 0.991   Anemia Panel: No results for input(s): VITAMINB12, FOLATE, FERRITIN, TIBC, IRON, RETICCTPCT in the last 72 hours. Sepsis Labs: No results for input(s): PROCALCITON,  LATICACIDVEN in the last 168 hours.  Recent Results (from the past 240 hour(s))  Respiratory Panel by RT PCR (Flu A&B, Covid) - Nasopharyngeal Swab     Status: None   Collection Time: 12/20/19  3:00 PM   Specimen: Nasopharyngeal Swab  Result Value Ref Range Status   SARS Coronavirus 2 by RT PCR NEGATIVE NEGATIVE Final    Comment: (NOTE) SARS-CoV-2 target nucleic acids are NOT DETECTED. The SARS-CoV-2 RNA is generally detectable in upper respiratoy specimens during the acute phase of infection. The lowest concentration of SARS-CoV-2 viral copies this assay can detect is 131 copies/mL. A negative result does not preclude SARS-Cov-2 infection and should not be used as the sole basis for treatment or other patient management decisions. A negative result may occur with  improper specimen collection/handling, submission of specimen other than nasopharyngeal swab, presence of viral mutation(s) within the areas targeted by this assay, and inadequate number of viral copies (<131 copies/mL).  A negative result must be combined with clinical observations, patient history, and epidemiological information. The expected result is Negative. Fact Sheet for Patients:  PinkCheek.be Fact Sheet for Healthcare Providers:  GravelBags.it This test is not yet ap proved or cleared by the [place] FDA and  has been authorized for detection and/or diagnosis of SARS-CoV-2 by FDA under an Emergency Use Authorization (EUA). This EUA will remain  in effect (meaning this test can be used) for the duration of the COVID-19 declaration under Section 564(b)(1) of the Act, 21 U.S.C. section 360bbb-3(b)(1), unless the authorization is terminated or revoked sooner.    Influenza A by PCR NEGATIVE NEGATIVE Final   Influenza B by PCR NEGATIVE NEGATIVE Final    Comment: (NOTE) The Xpert Xpress SARS-CoV-2/FLU/RSV assay is intended as an aid in  the diagnosis of  influenza from Nasopharyngeal swab specimens and  should not be used as a sole basis for treatment. Nasal washings and  aspirates are unacceptable for Xpert Xpress SARS-CoV-2/FLU/RSV  testing. Fact Sheet for Patients: PinkCheek.be Fact Sheet for Healthcare Providers: GravelBags.it This test is not yet approved or cleared by the [place] FDA and  has been authorized for detection and/or diagnosis of SARS-CoV-2 by  FDA under an Emergency Use Authorization (EUA). This EUA will remain  in effect (meaning this test can be used) for the duration of the  Covid-19 declaration under Section 564(b)(1) of the Act, 21  U.S.C. section 360bbb-3(b)(1), unless the authorization is  terminated or revoked. Performed at [facility], [place] [address], [place], [place] [address]          Radiology Studies: MR ANGIO HEAD WO CONTRAST  Result Date: 12/21/2019 CLINICAL DATA:  Left thalamic stroke EXAM: MRA HEAD WITHOUT CONTRAST TECHNIQUE: Angiographic images of the Circle of Willis were obtained using MRA technique without intravenous contrast. COMPARISON:  MRI head 12/20/2019 FINDINGS: Both vertebral arteries widely patent. Right PICA patent. Left PICA is small and appears to arise from the distal left vertebral artery. Right AICA patent. Basilar widely patent. Superior cerebellar and posterior cerebral arteries patent bilaterally. Internal carotid artery widely patent bilaterally. Anterior and middle cerebral arteries patent. Mild stenosis of the origin of a branch of the superior division of the right middle cerebral artery. This vessel also shows a moderately severe distal stenosis. Negative for cerebral aneurysm. IMPRESSION: Atherosclerotic disease right middle cerebral artery branch otherwise negative. No large vessel occlusion. Electronically Signed   By: Franchot Gallo M.D.   On: 12/21/2019 17:28   MR BRAIN WO CONTRAST  Result Date:  12/20/2019 CLINICAL DATA:  Neuro deficit, acute, stroke suspected. Additional history provided: Right facial numbness and right arm weakness, known history of carotid disease. EXAM: MRI HEAD WITHOUT CONTRAST TECHNIQUE: Multiplanar, multiecho pulse sequences of the brain and surrounding structures were obtained without intravenous contrast. COMPARISON:  Report from head CT 05/24/2000 (images unavailable). FINDINGS: Brain: There is a 7 mm focus of restricted diffusion within the left thalamus consistent with acute lacunar infarct (series 5, image 30). Corresponding T2/FLAIR hyperintensity at this site. No acute infarct is identified elsewhere within the brain. No significant white matter disease for age. Cerebral volume is normal. No evidence of intracranial mass. No chronic intracranial blood products. No extra-axial fluid collection. No midline shift. Vascular: Expected proximal arterial flow voids. Skull and upper cervical spine: No focal marrow lesion. Sequela of prior cervical spine fusion with susceptibility artifact arising from spinal fusion hardware. Adjacent level disease at C2-C3 with a posterior disc osteophyte  complex and ligamentum flavum hypertrophy contributing to suspected at least mild/moderate spinal canal stenosis. Sinuses/Orbits: Visualized orbits show no acute finding. Right maxillary sinus mucous retention cyst. Mild ethmoid and left sphenoid sinus mucosal thickening. Trace fluid within the bilateral mastoid air cells. 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 Otherwise unremarkable MRI appearance of the brain for age. Paranasal sinus disease as described. Trace bilateral mastoid effusions. Sequela of prior cervical fusion. C2-C3 adjacent level disease with suspected at least mild/moderate spinal canal stenosis. Electronically Signed   By: Kellie Simmering DO   On: 12/20/2019 12:56   MR ANGIO CHEST W WO CONTRAST  Result Date: 12/20/2019 CLINICAL DATA:  69 year old female with  severe contrast allergy and acute chest pain concerning for possible dissection. EXAM: MRA CHEST WITH OR WITHOUT CONTRAST TECHNIQUE: Angiographic images of the chest were obtained using MRA technique with intravenous contrast. CONTRAST:  82mL GADAVIST GADOBUTROL 1 MMOL/ML IV SOLN COMPARISON:  None. FINDINGS: VASCULAR Aorta: Conventional 3 vessel arch anatomy. No evidence of dissection, aneurysm or acute thoracic aortic abnormality. Mildly irregular atherosclerotic plaque present in the descending thoracic aorta. No penetrating ulceration. Signal void in the proximal left subclavian artery over approximately 2 cm may represent an intermediate segment occlusion. Heart: The heart is normal in size. However, there is significant concentric hypertrophy of the ventricular myocardium. Pulmonary Arteries:  Normal in caliber.  No large PE. Other: Right IJ approach single-lumen power injectable port catheter is present. The catheter tip terminates at the superior cavoatrial junction. NON-VASCULAR Mediastinum: No mediastinal mass or adenopathy. Lungs/pleura. No focal signal abnormality or abnormal enhancement. No evidence of pleural effusion. Upper abdomen: 1.8 cm simple cyst in the upper pole of the right kidney. No acute abnormality within the upper abdomen. Musculoskeletal: No focal signal abnormality or abnormal enhancement. IMPRESSION: VASCULAR 1. No evidence of acute aortic dissection, aortic aneurysm or other acute vascular abnormality. 2. 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 3. Heterogeneous and irregular/ulcerated atherosclerotic plaque along the descending thoracic aorta. 4. Concentric hypertrophy of the left ventricular myocardium suggests chronic systolic hypertension. NON-VASCULAR 1. No acute abnormality within the visualized upper abdomen. 2. Simple right upper pole renal cyst. Electronically Signed   By: Jacqulynn Cadet M.D.    On: 12/20/2019 15:01   ECHOCARDIOGRAM COMPLETE  Result Date: 12/21/2019    ECHOCARDIOGRAM REPORT   Patient Name:   [name] Date of Exam: 12/20/2019 Medical Rec #:  [id number]       Height:       63.0 in Accession #:    [id number]      Weight:       180.0 lb Date of Birth:  November 30, 1950        BSA:          1.849 m Patient Age:    76 years        BP:           123/70 mmHg Patient Gender: F               HR:           100 bpm. Exam Location:  Inpatient Procedure: 2D Echo Indications:     stroke 434.91  History:         Patient has prior history of Echocardiogram examinations, most                  recent 05/29/2015. Risk Factors:Hypertension, Diabetes,  Dyslipidemia and Sleep Apnea.  Sonographer:     [place] Referring Phys:  [place] Diagnosing Phys: Adrian Prows MD  Sonographer Comments: Image acquisition challenging due to respiratory motion. IMPRESSIONS  1. Left ventricular ejection fraction, by estimation, is 60 to 65%. The left ventricle has normal function. The left ventricle has no regional wall motion abnormalities. Left ventricular diastolic parameters are consistent with Grade I diastolic dysfunction (impaired relaxation).  2. Right ventricular systolic function is normal. The right ventricular size is normal.  3. Left atrial size was moderately dilated.  4. The mitral valve is grossly normal. Trivial mitral valve regurgitation. No evidence of mitral stenosis.  5. Mild calcification of the non coronary cusp.. The aortic valve is tricuspid. Aortic valve regurgitation is not visualized. No aortic stenosis is present.  6. The inferior vena cava is normal in size with greater than 50% respiratory variability, suggesting right atrial pressure of 3 mmHg. FINDINGS  Left Ventricle: Left ventricular ejection fraction, by estimation, is 60 to 65%. The left ventricle has normal function. The left ventricle has no regional wall motion abnormalities. The left ventricular  internal cavity size was small. There is no left ventricular hypertrophy. Left ventricular diastolic parameters are consistent with Grade I diastolic dysfunction (impaired relaxation). Normal left ventricular filling pressure. Right Ventricle: The right ventricular size is normal. No increase in right ventricular wall thickness. Right ventricular systolic function is normal. Left Atrium: Left atrial size was moderately dilated. Right Atrium: Right atrial size was normal in size. Pericardium: There is no evidence of pericardial effusion. Mitral Valve: The mitral valve is grossly normal. Mild to moderate mitral annular calcification. Trivial mitral valve regurgitation. No evidence of mitral valve stenosis. Tricuspid Valve: The tricuspid valve is normal in structure. Tricuspid valve regurgitation is not demonstrated. Aortic Valve: Mild calcification of the non coronary cusp. The aortic valve is tricuspid. Aortic valve regurgitation is not visualized. No aortic stenosis is present. There is mild calcification of the aortic valve. Pulmonic Valve: The pulmonic valve was not well visualized. Pulmonic valve regurgitation is trivial. Aorta: The aortic root is normal in size and structure. Venous: The inferior vena cava is normal in size with greater than 50% respiratory variability, suggesting right atrial pressure of 3 mmHg. IAS/Shunts: No atrial level shunt detected by color flow Doppler.  LEFT VENTRICLE PLAX 2D LVIDd:         3.20 cm  Diastology LVIDs:         2.00 cm  LV e' lateral:   7.94 cm/s LV PW:         1.20 cm  LV E/e' lateral: 8.9 LV IVS:        1.00 cm  LV e' medial:    5.87 cm/s LVOT diam:     1.80 cm  LV E/e' medial:  12.1 LV SV:         63 LV SV Index:   34 LVOT Area:     2.54 cm  RIGHT VENTRICLE RV S prime:     15.70 cm/s LEFT ATRIUM             Index       RIGHT ATRIUM          Index LA diam:        2.90 cm 1.57 cm/m  RA Area:     8.67 cm LA Vol (A2C):   33.3 ml 18.01 ml/m RA Volume:   15.10 ml 8.17  ml/m LA Vol (A4C):  34.1 ml 18.44 ml/m LA Biplane Vol: 33.6 ml 18.17 ml/m  AORTIC VALVE LVOT Vmax:   132.00 cm/s LVOT Vmean:  91.800 cm/s LVOT VTI:    0.248 m  AORTA Ao Root diam: 3.10 cm MITRAL VALVE MV Area (PHT): 4.36 cm     SHUNTS MV Decel Time: 174 msec     Systemic VTI:  0.25 m MV E velocity: 71.00 cm/s   Systemic Diam: 1.80 cm MV A velocity: 113.00 cm/s MV E/A ratio:  0.63 Adrian Prows MD Electronically signed by Adrian Prows MD Signature Date/Time: 12/21/2019/7:35:14 AM    Final         Scheduled Meds: . aspirin EC  81 mg Oral Daily  . Chlorhexidine Gluconate Cloth  6 each Topical Daily  . clopidogrel  75 mg Oral Daily  . cycloSPORINE  1 drop Both Eyes BID  . enoxaparin (LOVENOX) injection  40 mg Subcutaneous Q24H  . ezetimibe  10 mg Oral Daily  . insulin aspart  0-15 Units Subcutaneous TID WC  . insulin aspart  0-5 Units Subcutaneous QHS  . insulin detemir  12 Units Subcutaneous Daily  . levothyroxine  137 mcg Oral QAC breakfast  . lidocaine  1 patch Transdermal Q24H  . mometasone-formoterol  2 puff Inhalation BID  . montelukast  10 mg Oral BH-q7a  . mycophenolate  1,500 mg Oral BID  . pantoprazole  40 mg Oral Daily  . predniSONE  10 mg Oral BH-q7a  . rosuvastatin  40 mg Oral QHS  . sodium chloride flush  10-40 mL Intracatheter Q12H   Continuous Infusions:   LOS: 2 days    Time spent: 30 minutes    Barb Merino, MD [facility] Pager [phone]

## 2019-12-22 NOTE — Progress Notes (Signed)
Physical Therapy Treatment Patient Details Name: [name] MRN: [id number] DOB: 1951/02/03 Today's Date: 12/22/2019    History of Present Illness Pt is a 69 yo female presenting with c/o chest pain and R-sided face and arm weakness/numbness and tingling. Upon admission, imaging revealed acute L thalamic CVA. PMH includes: Myasthenia gravis, HTN, DM II, thyroid cancer, HCL, and OSA.    PT Comments    Patient seen for mobility progression. Pt continues to present with R side weakness and requires min-mod A for functional transfer/gait training without use of AD. Pt's sister present and very supportive. Pt remains a good candidate for CIR level therapies.     Follow Up Recommendations  CIR;Supervision/Assistance - 24 hour     Equipment Recommendations  Rolling walker with 5" wheels;3in1 (PT)(defer to post acute)    Recommendations for Other Services       Precautions / Restrictions Precautions Precautions: Fall Restrictions Weight Bearing Restrictions: No    Mobility  Bed Mobility Overal bed mobility: Needs Assistance Bed Mobility: Rolling;Sidelying to Sit Rolling: Min assist Sidelying to sit: Min guard       General bed mobility comments: cues for sequencing/increased use of L UE   Transfers Overall transfer level: Needs assistance Equipment used: None Transfers: Stand Pivot Transfers;Sit to/from Stand Sit to Stand: Mod assist;Min assist Stand pivot transfers: Min assist;Mod assist       General transfer comment: assist for balance; initial mod a. reports feeling unsteady and wanting to hold onto things. educated on not holding onto anything that will move or break (doors, soap dishes, towel racks)  Ambulation/Gait Ambulation/Gait assistance: Mod assist;Min assist Gait Distance (Feet): 65 Feet Assistive device: 1 person hand held assist(assist at trunk with gait belt) Gait Pattern/deviations: Step-through pattern;Step-to pattern;Decreased weight shift to  right;Decreased dorsiflexion - right;Decreased step length - right;Decreased stride length Gait velocity: decreased    General Gait Details: assist for weight shifting and balance; pt with difficulty advancing R LE    Stairs             Wheelchair Mobility    Modified Rankin (Stroke Patients Only) Modified Rankin (Stroke Patients Only) Pre-Morbid Rankin Score: Slight disability Modified Rankin: Moderately severe disability     Balance Overall balance assessment: Needs assistance Sitting-balance support: Feet supported Sitting balance-Leahy Scale: Good     Standing balance support: During functional activity;Single extremity supported Standing balance-Leahy Scale: Poor                              Cognition Arousal/Alertness: Awake/alert Behavior During Therapy: WFL for tasks assessed/performed Overall Cognitive Status: Within Functional Limits for tasks assessed                                        Exercises      General Comments        Pertinent Vitals/Pain Pain Assessment: Faces Faces Pain Scale: Hurts even more Pain Location: head ache after ambulation Pain Descriptors / Indicators: Aching Pain Intervention(s): Limited activity within patient's tolerance;Monitored during session;Repositioned    Home Living                      Prior Function            PT Goals (current goals can now be found in the care plan section) Progress towards PT  goals: Progressing toward goals    Frequency    Min 4X/week      PT Plan Current plan remains appropriate    Co-evaluation PT/OT/SLP Co-Evaluation/Treatment: Yes Reason for Co-Treatment: For patient/therapist safety PT goals addressed during session: Mobility/safety with mobility;Balance        AM-PAC PT "6 Clicks" Mobility   Outcome Measure  Help needed turning from your back to your side while in a flat bed without using bedrails?: None Help needed moving  from lying on your back to sitting on the side of a flat bed without using bedrails?: A Little Help needed moving to and from a bed to a chair (including a wheelchair)?: A Little Help needed standing up from a chair using your arms (e.g., wheelchair or bedside chair)?: A Little Help needed to walk in hospital room?: A Lot Help needed climbing 3-5 steps with a railing? : A Lot 6 Click Score: 17    End of Session Equipment Utilized During Treatment: Gait belt Activity Tolerance: Patient tolerated treatment well Patient left: in chair;with call bell/phone within reach;with chair alarm set;with family/visitor present Nurse Communication: Mobility status PT Visit Diagnosis: History of falling (Z91.81);Muscle weakness (generalized) (M62.81);Difficulty in walking, not elsewhere classified (R26.2)     Time: 1121-1150 PT Time Calculation (min) (ACUTE ONLY): 29 min  Charges:  $Gait Training: 8-22 mins                     Earney Navy, PTA Acute Rehabilitation Services Pager: [phone] Office: [phone]     Darliss Cheney 12/22/2019, 4:20 PM

## 2019-12-22 NOTE — Progress Notes (Signed)
Inpatient Diabetes Program Recommendations  AACE/ADA: New Consensus Statement on Inpatient Glycemic Control (2015)  Target Ranges:  Prepandial:   less than 140 mg/dL      Peak postprandial:   less than 180 mg/dL (1-2 hours)      Critically ill patients:  140 - 180 mg/dL   Lab Results  Component Value Date   GLUCAP 297 (H) 12/22/2019   HGBA1C 9.1 (H) 12/20/2019    Review of Glycemic Control Results for [name], [name] (MRN [id number]) as of 12/22/2019 11:24  Ref. Range 12/21/2019 03:10 12/21/2019 06:05 12/21/2019 11:26 12/21/2019 16:07 12/21/2019 21:42 12/22/2019 03:18 12/22/2019 06:11 12/22/2019 08:31  Glucose-Capillary Latest Ref Range: 70 - 99 mg/dL 202 (H) 301 (H) 351 (H) 305 (H) 335 (H) 279 (H) 281 (H) 297 (H)   Diabetes history: DM Outpatient Diabetes medications: Omnipod insulin pump Current orders for Inpatient glycemic control:  Novolog 0-9 units tid + hs PO prednisone 10 mg Daily  A1c 9.1% on 12/20/19  Inpatient Diabetes Program Recommendations:     Called Dr. Almetta Lovely Nurse for insulin pump settings again this am.  Dr. Chalmers Cater called back and reported pt takes approx 1.5 units/hour for basal (36 units in a 24 hour period) and 1:5 for carbohydrate coverage (approx 15 units for a 75 gram carbohydrate meal).  Please increase Levemir to 35 units  Please add Novolog meal coverage 12 units tid if eating >50% of meals.   Thanks,  Tama Headings RN, MSN, BC-ADM Inpatient Diabetes Coordinator Team Pager [phone] (8a-5p)

## 2019-12-22 NOTE — H&P (Signed)
Physical Medicine and Rehabilitation Admission H&P    Chief Complaint  Patient presents with  . Chest Pain  : HPI: [name]. Cryder is a 69 year old right-handed female history of diabetes mellitus, ACDF 2014, chronic pain syndrome, myasthenia gravis maintained on CellCept as well as chronic prednisone and receives IVIG at [facility], asthma /OSA on CPAP, thyroid cancer, hypertension, hyperlipidemia. History taken from chart review and patient. Patient lives alone independent prior to admission and driving.  1 level home.  She does have family in the area that worked during the day.  She presented on 12/20/19 with right hemiparesis.  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  Nuclear medicine perfusion scan no pulmonary embolus.  Patient with recent carotid Doppler noting right ICA greater than 70% and left ICA 50-69% stenosis.  Echocardiogram with ejection fraction of 33%, grade 1 diastolic dysfunction.  Admission chemistries potassium 3.3, troponin negative, BNP 215.  Currently maintained on aspirin and Plavix for CVA prophylaxis.  Subcutaneous Lovenox for DVT prophylaxis.  Vascular surgery Dr. Donzetta Matters follow-up in regards to carotid stenosis no current plan for surgical intervention.  She is tolerating a regular diet.  Therapy evaluations completed and patient was admitted for a comprehensive rehab program.  Please see preadmission assessment from earlier today as well.  Review of Systems  Constitutional: Negative for chills and fever.  HENT: Negative for hearing loss.   Eyes: Negative for blurred vision and double vision.  Respiratory: Negative for cough and shortness of breath.   Cardiovascular: Negative for chest pain and palpitations.  Gastrointestinal: Negative for heartburn, nausea and vomiting.       GERD  Genitourinary: Negative for dysuria, flank pain and hematuria.  Musculoskeletal: Positive for myalgias.  Skin: Negative for rash.  Neurological: Positive for  sensory change, focal weakness and weakness.   Past Medical History:  Diagnosis Date  . Arthritis    "all over my body" (03/13/2013)  . Asthma   . Asymptomatic carotid artery stenosis, bilateral 10/08/2018  . GERD (gastroesophageal reflux disease)   . H/O hiatal hernia   . Heart murmur   . Hypercholesterolemia 10/08/2018  . Hypertension   . Hypothyroidism   . Myasthenia gravis ([place])    "in my eyes; diagnsosed > 7 yr ago" (03/13/2013)  . Sleep apnea    on CPAP  . Thyroid carcinoma ([place])   . Type II diabetes mellitus ([place])    Past Surgical History:  Procedure Laterality Date  . ABDOMINAL HYSTERECTOMY    . ANTERIOR CERVICAL DECOMP/DISCECTOMY FUSION     "I've had severa ORs; always went in from the front" (03/13/2013)  . APPENDECTOMY    . CARDIAC CATHETERIZATION     "several" (03/13/2013)  . CARPAL TUNNEL RELEASE Right   . CATARACT EXTRACTION W/ INTRAOCULAR LENS  IMPLANT, BILATERAL Bilateral   . CHOLECYSTECTOMY    . KNEE ARTHROSCOPY Left   . SHOULDER ARTHROSCOPY W/ ROTATOR CUFF REPAIR Left   . TONSILLECTOMY    . TOTAL THYROIDECTOMY     Family History  Problem Relation Age of Onset  . Coronary artery disease Sister        s/p coronary stenting  . Stroke Sister 55  . Diabetes Sister   . Congestive Heart Failure Mother   . Asthma Mother   . Diabetes Mother   . Congestive Heart Failure Father   . Lung cancer Father   . Asthma Father   . Diabetes Father   . Diabetes Brother  Social History:  reports that she has never smoked. She has never used smokeless tobacco. She reports that she does not drink alcohol or use drugs. Allergies:  Allergies  Allergen Reactions  . Contrast Media [Iodinated Diagnostic Agents] Anaphylaxis    01-10-15---PT GIVEN 13 HR PRE MEDS FOR CT--PT TOLERATED IV CONTRAST W/O ANY REACTION------[name] RT-R CT  . Fluorescein Shortness Of Breath and Other (See Comments)    (Dye)  . Iodine Anaphylaxis    Contrast dye - iodine  . Molds & Smuts  Shortness Of Breath and Other (See Comments)    Congestion and wheezing, also  . Shellfish-Derived Products Anaphylaxis, Shortness Of Breath, Swelling and Other (See Comments)    Welts, also  . Codeine Hives and Nausea Only  . Other Rash and Other (See Comments)    Coban causes welts, also   Medications Prior to Admission  Medication Sig Dispense Refill  . Biotin 5000 MCG CAPS Take 5,000 mcg by mouth daily with breakfast.     . BUTRANS 10 MCG/HR PTWK patch Place 1 patch onto the skin See admin instructions. Place 1 patch onto the skin every 7 days as needed for pain    . BYDUREON BCISE 2 MG/0.85ML AUIJ Inject 2 mg into the skin every Monday.     . canagliflozin (INVOKANA) 300 MG TABS tablet Take 300 mg by mouth daily before breakfast.    . Continuous Blood Gluc Receiver (FREESTYLE LIBRE 14 DAY READER) DEVI 1 Device by Does not apply route as directed.     . Continuous Blood Gluc Sensor (FREESTYLE LIBRE 14 DAY SENSOR) MISC Inject 1 patch into the skin every 14 (fourteen) days.    . cycloSPORINE (RESTASIS) 0.05 % ophthalmic emulsion Place 1 drop into both eyes 2 (two) times daily.    . diclofenac sodium (VOLTAREN) 1 % GEL Apply 2 g topically daily as needed (to painful sites).     . diphenhydrAMINE (BENADRYL) 25 mg capsule Take 25 mg by mouth every 6 (six) hours as needed for allergies.    . diphenhydrAMINE in sodium chloride 0.9 % 50 mL Inject 50 mg into the vein as directed. 50 mg with all infusions    . esomeprazole (NEXIUM) 40 MG capsule Take 40 mg by mouth 2 (two) times daily.    Marland Kitchen ezetimibe (ZETIA) 10 MG tablet Take 1 tablet (10 mg total) by mouth daily. 90 tablet 1  . ibuprofen (ADVIL,MOTRIN) 800 MG tablet Take 800 mg by mouth See admin instructions. Take 800 mg by mouth every 4-6 hours as needed for pain or headaches    . Immune Globulin, Human, 40 GM/400ML SOLN Inject as directed See admin instructions. At home, for 2 days, every 6 weeks    . Insulin Disposable Pump (OMNIPOD DASH  SYSTEM) KIT continuous.     . insulin lispro (HUMALOG) 100 UNIT/ML injection Inject into the skin See admin instructions. Per insulin pump (Omnipod Dash)    . levalbuterol (XOPENEX) 0.63 MG/3ML nebulizer solution Take 0.63 mg by nebulization every 6 (six) hours as needed for wheezing or shortness of breath.     . Levothyroxine Sodium 137 MCG CAPS Take 137 mcg by mouth daily before breakfast.     . meclizine (ANTIVERT) 25 MG tablet Take 25 mg by mouth 3 (three) times daily as needed for dizziness (or migraine-related nausea).     . Misc Natural Products (AIRBORNE ELDERBERRY) CHEW Chew 1 tablet by mouth daily.    . mometasone (NASONEX) 50 MCG/ACT nasal spray  Place 2 sprays into the nose daily as needed (for allergies).     . montelukast (SINGULAIR) 10 MG tablet Take 10 mg by mouth every morning.    . mycophenolate (CELLCEPT) 500 MG tablet Take 1,500 mg by mouth in the morning and at bedtime.     Marland Kitchen olmesartan (BENICAR) 20 MG tablet Take 20 mg by mouth daily.    . predniSONE (DELTASONE) 10 MG tablet Take 10 mg by mouth every morning.    Marland Kitchen PRESCRIPTION MEDICATION See admin instructions. CPAP- At bedtime    . PROAIR HFA 108 (90 BASE) MCG/ACT inhaler Inhale 2 puffs into the lungs every 6 (six) hours as needed for wheezing or shortness of breath.     . riTUXimab (RITUXAN) 500 MG/50ML injection Inject 1,000 mg into the vein See admin instructions. 1,000 mg via IV, then another 1,000 mg in two weeks- repeat after 6 months: "riTUXimab (RITUXAN) 1,000 mg in sodium chloride 0.9% 1,000 mL infusion- Begin infusion at 50 mL/hr, if no hypersensitivity or infusion-related events occur, may increase by 50 mL/hr every 30 minutes to a maximum infusion rate of 400 mL/hr"    . spironolactone (ALDACTONE) 25 MG tablet Take 25 mg by mouth daily.    . TRANSDERM-SCOP, 1.5 MG, 1 MG/3DAYS Place 1 patch onto the skin every three (3) days as needed ("for migraine relief").     . vitamin B-12 (CYANOCOBALAMIN) 1000 MCG tablet Take  1,000 mcg by mouth daily.    . Vitamin D, Ergocalciferol, (DRISDOL) 50000 UNITS CAPS capsule Take 50,000 Units by mouth every Monday.     . zolpidem (AMBIEN) 5 MG tablet Take 5 mg by mouth at bedtime as needed for sleep.     . B-D ULTRAFINE III SHORT PEN 31G X 8 MM MISC     . rosuvastatin (CRESTOR) 40 MG tablet Take 40 mg by mouth at bedtime.    . simvastatin (ZOCOR) 40 MG tablet Take 1 tablet (40 mg total) by mouth daily at 6 PM. (Patient not taking: Reported on 12/19/2019) 90 tablet 1  . SYMBICORT 160-4.5 MCG/ACT inhaler USE 2 INHALATIONS TWICE A DAY TO PREVENT COUGH OR WHEEZE. RINSE, GARGLE AND SPIT AFTER USE (Patient taking differently: Inhale 2 puffs into the lungs See admin instructions. ) 30.6 g 1    Drug Regimen Review Drug regimen was reviewed and remains appropriate with no significant issues identified  Home: Home Living Family/patient expects to be discharged to:: Private residence Living Arrangements: Alone Available Help at Discharge: Family, Available PRN/intermittently Type of Home: House Home Access: Stairs to enter [employer] of Steps: side: 2 without rail; front door: 5 without rail Entrance Stairs-Rails: None Home Layout: One level Bathroom Shower/Tub: Chiropodist: Standard Home Equipment: Cane - single point, [employer] - quad, Grab bars - tub/shower Additional Comments: pt reports she is unsure of AD, it was all her mothers.  Lives With: Alone   Functional History: Prior Function Level of Independence: Independent Comments: Pt independent in all ADL, IADL, and mobility tasks. Pt does not ambulate with an assistive device and reports 0 falls in the last 6 months. Pt still drives and is retired.   Functional Status:  Mobility: Bed Mobility Overal bed mobility: Needs Assistance Bed Mobility: Supine to Sit Rolling: Min assist Sidelying to sit: Min guard Supine to sit: Supervision, HOB elevated General bed mobility comments: pt OOB in  chair upon arrival Transfers Overall transfer level: Needs assistance Equipment used: None Transfers: Sit to/from Stand Sit to  Stand: Min assist Stand pivot transfers: Min assist, Mod assist General transfer comment: assist once in standing for balance; pt able to power up with min guard for safety and cues to use R hand  Ambulation/Gait Ambulation/Gait assistance: Mod assist Gait Distance (Feet): (100 ft with 2 standing rest breaks) Assistive device: 1 person hand held assist(and assist at trunk with gait belt) Gait Pattern/deviations: Decreased step length - left, Decreased stance time - right, Decreased dorsiflexion - right, Decreased weight shift to right, Step-to pattern, Step-through pattern General Gait Details: assist for weight shifting and balance; cues for sequencing; decreased R step clearance; LOB posteriorly  Gait velocity: decreased  Gait velocity interpretation: <1.8 ft/sec, indicate of risk for recurrent falls    ADL: ADL Overall ADL's : Needs assistance/impaired Eating/Feeding: Minimal assistance, Sitting Eating/Feeding Details (indicate cue type and reason): cues for integration and use of R hands into tasks (opening container, lids, using hand to hold utensitl to eat), intermittent assistance with more difficult packages Grooming: Wash/dry hands, Wash/dry face, Oral care, Standing, Min guard, Supervision/safety Grooming Details (indicate cue type and reason): cues for involving RUE into ADLs Upper Body Bathing: Set up, Supervision/ safety, Sitting Lower Body Bathing: Supervison/ safety, Min guard, Sit to/from stand Upper Body Dressing : Minimal assistance, Sitting Upper Body Dressing Details (indicate cue type and reason): to new hospital gown as back side cover Lower Body Dressing: Min guard, Sitting/lateral leans Lower Body Dressing Details (indicate cue type and reason): to pull socks up from EOB Toilet Transfer: Minimal assistance, Ambulation Toilet Transfer  Details (indicate cue type and reason): simulated via functional mobility; increased time and effort noted needing MIN A for balance and support on R side Toileting- Clothing Manipulation and Hygiene: Minimal assistance, Sitting/lateral lean, Sit to/from stand Toileting - Clothing Manipulation Details (indicate cue type and reason): s seated, heavy min a to stand and pull up underwear while using both hands to pull up Functional mobility during ADLs: Minimal assistance General ADL Comments: pt continues to present with RUE inattention, decreased endurance and decreased RUE [place] impacting pts ability to engage in ADLs. Session focus on increasing functional mobility distance, standing grooming tasks and RUE HEP with theraputty  Cognition: Cognition Overall Cognitive Status: Within Functional Limits for tasks assessed Arousal/Alertness: Awake/alert Orientation Level: Oriented X4 Attention: Selective Selective Attention: Appears intact Memory: Appears intact Awareness: Appears intact Problem Solving: Appears intact Executive Function: Reasoning Reasoning: Appears intact Cognition Arousal/Alertness: Awake/alert Behavior During Therapy: WFL for tasks assessed/performed Overall Cognitive Status: Within Functional Limits for tasks assessed General Comments: Pt pleasant and willing to participate in therapy tasks. Pt able to follow multi-step instructions without difficulty.   Physical Exam: Blood pressure (!) 139/58, pulse 96, temperature 98.5 F (36.9 C), temperature source Oral, resp. rate 20, height '5\' 3"'  (1.6 m), weight 81.6 kg, SpO2 96 %. Physical Exam  Vitals reviewed. Constitutional: She appears well-developed and well-nourished.  HENT:  Head: Normocephalic and atraumatic.  Exophthalmos  Eyes: EOM are normal. Right eye exhibits no discharge. Left eye exhibits no discharge.  Neck: No tracheal deviation present. No thyromegaly present.  Respiratory: Effort normal. No stridor. No  respiratory distress.  GI: Soft. She exhibits no distension.  Musculoskeletal:     Comments: No edema or tenderness in extremities  Neurological: She is alert.  Patient is alert in no acute distress.   Follows full commands  Alert oriented x3 Motor: RUE: shoulder abduction 4/5, elbow flex/ext 4/5, hand grip 3-/5 RLE: 3/5 proximal to distal Sensation diminished to  light touch on right side Facial weakness  Skin: Skin is warm and dry.  Psychiatric: She has a normal mood and affect. Her behavior is normal. Thought content normal.    Results for orders placed or performed during the hospital encounter of 12/19/19 (from the past 48 hour(s))  Glucose, capillary     Status: Abnormal   Collection Time: 12/25/19  4:26 PM  Result Value Ref Range   Glucose-Capillary 228 (H) 70 - 99 mg/dL    Comment: Glucose reference range applies only to samples taken after fasting for at least 8 hours.  Glucose, capillary     Status: Abnormal   Collection Time: 12/25/19  9:13 PM  Result Value Ref Range   Glucose-Capillary 255 (H) 70 - 99 mg/dL    Comment: Glucose reference range applies only to samples taken after fasting for at least 8 hours.   Comment 1 Notify RN    Comment 2 Document in Chart   Glucose, capillary     Status: Abnormal   Collection Time: 12/26/19  6:15 AM  Result Value Ref Range   Glucose-Capillary 201 (H) 70 - 99 mg/dL    Comment: Glucose reference range applies only to samples taken after fasting for at least 8 hours.   Comment 1 Notify RN    Comment 2 Document in Chart   Glucose, capillary     Status: Abnormal   Collection Time: 12/26/19 11:55 AM  Result Value Ref Range   Glucose-Capillary 146 (H) 70 - 99 mg/dL    Comment: Glucose reference range applies only to samples taken after fasting for at least 8 hours.  Glucose, capillary     Status: Abnormal   Collection Time: 12/26/19  3:28 PM  Result Value Ref Range   Glucose-Capillary 301 (H) 70 - 99 mg/dL    Comment: Glucose  reference range applies only to samples taken after fasting for at least 8 hours.  Glucose, capillary     Status: Abnormal   Collection Time: 12/26/19  9:30 PM  Result Value Ref Range   Glucose-Capillary 239 (H) 70 - 99 mg/dL    Comment: Glucose reference range applies only to samples taken after fasting for at least 8 hours.   Comment 1 Notify RN    Comment 2 Document in Chart   Glucose, capillary     Status: Abnormal   Collection Time: 12/27/19  6:12 AM  Result Value Ref Range   Glucose-Capillary 145 (H) 70 - 99 mg/dL    Comment: Glucose reference range applies only to samples taken after fasting for at least 8 hours.   Comment 1 Notify RN    Comment 2 Document in Chart   Glucose, capillary     Status: Abnormal   Collection Time: 12/27/19 11:40 AM  Result Value Ref Range   Glucose-Capillary 303 (H) 70 - 99 mg/dL    Comment: Glucose reference range applies only to samples taken after fasting for at least 8 hours.   No results found.     Medical Problem List and Plan: 1.  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  Antithrombotics: -DVT/anticoagulation: Lovenox  -antiplatelet therapy: Aspirin 81 mg daily and Plavix 75 mg daily x3 weeks then aspirin alone 3. Pain Management: Lidoderm patch, Voltaren gel as needed 4. Mood: Provide emotional support  -antipsychotic agents: N/A 5. Neuropsych: This patient is capable of making decisions  on her own behalf. 6. Skin/Wound Care: Routine skin checks 7. Fluids/Electrolytes/Nutrition: Routine in and outs.  CMP ordered. 8.  Bilateral carotid stenosis.  Follow-up outpatient vascular as outpatient. 9.  Myasthenia gravis.  Continue Cellcept 1500 BID, prednisone 10 mg daily.  She is followed at [facility] and also receives IVIG. 10.  Diabetes mellitus with hyperglycemia.  Hemoglobin A1c 9.1.  NovoLog 15  units 3 times daily, Levemir 35 units daily.  Check blood sugars before meals and at bedtime.  Monitor with increased mobility 11.  OSA/asthma.  Singulair 10 mg daily, continue inhalers as directed as well as CPAP.  Monitor with increased exertion 12.Hypertension.  Avapro 150 mg daily, Aldactone 25 mg daily.    Monitor with increased mobility  13.  Hypothyroidism.  Synthroid 14.  Hyperlipidemia.  Zetia/Crestor 15.  GERD.  Protonix  Lavon Paganini Angiulli, PA-C 12/27/2019  I have personally performed a face to face diagnostic evaluation, including, but not limited to relevant history and physical exam findings, of this patient and developed relevant assessment and plan.  Additionally, I have reviewed and concur with the physician assistant's documentation above.  Delice Lesch, MD, ABPMR

## 2019-12-22 NOTE — Plan of Care (Signed)
  Problem: Clinical Measurements: Goal: Ability to maintain clinical measurements within normal limits will improve Outcome: Progressing   Problem: Clinical Measurements: Goal: Will remain free from infection Outcome: Progressing   Problem: Clinical Measurements: Goal: Diagnostic test results will improve Outcome: Progressing   Problem: Health Behavior/Discharge Planning: Goal: Ability to manage health-related needs will improve Outcome: Progressing

## 2019-12-22 NOTE — Progress Notes (Signed)
STROKE TEAM PROGRESS NOTE   INTERVAL HISTORY Her RN is at [name] bedside.  [name] states her right face and hand numbness is improving but still present.  She has no complaints.  Echocardiogram was unremarkable.  She is awaiting rehab transfer when bed available Vitals:   12/22/19 0015 12/22/19 0324 12/22/19 0700 12/22/19 1230  BP: 140/67 (!) 136/53 (!) 142/65 (!) 149/64  Pulse: 92 90 90 (!) 104  Resp: 16 18 16 20   Temp: 98.6 F (37 C) 97.9 F (36.6 C) 97.9 F (36.6 C) 98.3 F (36.8 C)  TempSrc: Oral Oral Oral Oral  SpO2: 97% 97% 100% 97%  Weight:      Height:        CBC:  Recent Labs  Lab 12/19/19 1141  WBC 6.7  HGB 11.8*  HCT 36.2  MCV 94.3  PLT 0000000    Basic Metabolic Panel:  Recent Labs  Lab 12/19/19 1141  NA 140  K 3.3*  CL 106  CO2 24  GLUCOSE 124*  BUN 20  CREATININE 0.69  CALCIUM 10.1   Lipid Panel:     Component Value Date/Time   CHOL 193 12/21/2019 0618   CHOL 243 (H) 04/14/2019 1018   TRIG 237 (H) 12/21/2019 0618   HDL 45 12/21/2019 0618   HDL 67 04/14/2019 1018   CHOLHDL 4.3 12/21/2019 0618   VLDL 47 (H) 12/21/2019 0618   LDLCALC 101 (H) 12/21/2019 0618   LDLCALC 146 (H) 04/14/2019 1018   HgbA1c:  Lab Results  Component Value Date   HGBA1C 9.1 (H) 12/20/2019   Urine Drug Screen: No results found for: LABOPIA, COCAINSCRNUR, LABBENZ, AMPHETMU, THCU, LABBARB  Alcohol Level No results found for: ETH  IMAGING past 24 hours MR ANGIO HEAD WO CONTRAST  Result Date: 12/21/2019 CLINICAL DATA:  Left thalamic stroke EXAM: MRA HEAD WITHOUT CONTRAST TECHNIQUE: Angiographic images of [name] Circle of [place] were obtained using MRA technique without intravenous contrast. COMPARISON:  MRI head 12/20/2019 FINDINGS: Both vertebral arteries widely patent. Right PICA patent. Left PICA is small and appears to arise from [name] distal left vertebral artery. Right AICA patent. Basilar widely patent. Superior cerebellar and posterior cerebral arteries patent bilaterally.  Internal carotid artery widely patent bilaterally. Anterior and middle cerebral arteries patent. Mild stenosis of [name] origin of a branch of [name] superior division of [name] right middle cerebral artery. This vessel also shows a moderately severe distal stenosis. Negative for cerebral aneurysm. IMPRESSION: Atherosclerotic disease right middle cerebral artery branch otherwise negative. No large vessel occlusion. Electronically Signed   By: Franchot Gallo M.D.   On: 12/21/2019 17:28    PHYSICAL EXAM Constitutional: Pleasant middle-aged African-American lady who appears well-developed and well-nourished.  Eyes: No scleral injection, exophthalmos HENT: No OP obstrucion Head: Normocephalic.  Cardiovascular: Normal rate and regular rhythm.  Respiratory: Effort normal, non-labored breathing GI: Soft. No distension. There is no tenderness.  Skin: WDI  Neuro: Mental Status: [name] is awake, alert, oriented to person, place, month, year, and situation. Speech-clear, intact naming, repeating and comprehension.  Able to follow commands with no difficulties.  Able to give a clear history. Cranial Nerves: II: Visual Fields are full.  III,IV, VI: EOMI without ptosis or diploplia. Pupils equal, round and reactive to light V: Facial sensation decreased on [name] right VII: Facial movement is symmetric.  VIII: hearing is intact to voice X: Palat elevates symmetrically XI: Shoulder shrug is symmetric. XII: tongue is midline without atrophy or fasciculations.  Motor: 4/5 strength with [name]  right shoulder, right arm flexion and extension.  Otherwise 5/5 No drift Sensory: Sensation is symmetric to light touch and temperature in [name] arms and legs. Deep Tendon Reflexes: Depressed throughout Plantars: Toes are downgoing bilaterally.  Cerebellar: FNF and HKS are intact bilaterally  ASSESSMENT/PLAN [name] [name] is a 69 y.o. female presenting with left facial numbness for 3 days along with shortness of  breath and some chest pain PTA.  During work-up for her ailments, an MRI was obtained which showed a left thalamic infarct. Although she does have bilateral carotid artery stenosis this would be a small vessel infarct and not related to [name]'s carotid artery stenosis.  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 Will need out pt vascular f/u to monitor this  2D Echo pending  LDL 101  HgbA1c 9.1  Lovenox for VTE prophylaxis    Diet   Diet heart healthy/carb modified Room service appropriate? Yes; Fluid consistency: Thin    None prior to admission, now on ASA + Plavix x 3wks, then monotherapy with ASA only  Therapy recommendations: CLR   disposition:  pending  Hypertension  Home meds:  Aldactone  Stable . No role in Permissive hypertension  . Long-term BP goal normotensive  Hyperlipidemia  Home meds:  Crestor 40mg , resumed in hospital  LDL 101, goal < 70  Continue statin at discharge  Diabetes type II Uncontrolled  Home meds:  Insulin,   HgbA1c 9.1, goal < 7.0  CBGs Recent Labs    12/22/19 0611 12/22/19 0831 12/22/19 1157  GLUCAP 281* 297* 469*      SSI  Other Stroke Risk Factors  Advanced age  Obesity, Body mass index is 31.89 kg/m., recommend weight loss, diet and exercise as appropriate   Hx stroke/TIA  Other Active Problems  Thyroid cancer  Myasthenia gravis- on cellcept, IV Rituxan. Currently followed at [place] Carotid artery stenosis bilaterally  Sleep Apnea, followed by Dr Brett Fairy [facility])  Hospital day # 2     Recommend aspirin and Plavix for 3 weeks followed by aspirin alone and aggressive risk factor modification.   Aggressive risk factor modification continue treatment for myasthenia as per [facility] neurology.  Continue CPAP for sleep  apnea.  Transfer to inpatient rehab when bed available.  Discussed with [name] and Dr. Dante Gang.  Greater than 50% time during this 25-minute visit was spent on counseling and coordination of care about her lacunar stroke and myasthenia and answering questions.  Stroke team will sign off.  Kindly call for questions  Antony Contras, MD Medical Director [place] Pager: [phone] 12/22/2019 1:36 PM  To contact Stroke Continuity provider, please refer to [email or web address] After hours, contact General Neurology

## 2019-12-22 NOTE — PMR Pre-admission (Addendum)
PMR Admission Coordinator Pre-Admission Assessment  Patient: [name] is an 69 y.o., female MRN: [id number] DOB: 09-30-1950 Height: 5\' 3"  (160 cm) Weight: 81.6 kg              Insurance Information HMO:     PPO: yes     PCP:      IPA:      80/20:      OTHER:  PRIMARY: UHC Medicare      Policy#: [id number]      Subscriber: patient CM Name: Wilburn Cornelia      Phone#: [phone]     Fax#: [phone] Pre-Cert#: [id number]      Employer:  Josem Kaufmann [phone]) Everlene Balls ref #: [id number]) provided by Wilburn Cornelia on 5/4 for admit to CIR. Pt is approved for 7 days with next review date set for 5/10. Fax clinical updates to (f): [phone] (p) [phone] Benefits:  Phone #: online     Name: [email or web address] Eff. Date: 08/25/19-present     Deduct: $0 (does not have deductible)      Out of Pocket Max: $4,500 [phone])      Life Max: NA  CIR: $325/day co-pay for days 1-5, $0/day co-pay for days 6+      SNF: $0/day co-pay for days 1-20, $184/day co-pay for days 21-45, $0/day co-pay for days 46-100; limited to 100 days/cal yr Outpatient: $35/visit co-pay; limited by medical necessity      Home Health: 100% coverage; 0% co-insurance; limited by medical necessity       DME: 80% coverage     Co-Pay: 20% co-insurance Providers:  SECONDARY: None      Policy#:       Phone#:   Development worker, community:       Phone#:   [employer] Engineer, [employer]" for patients in Inpatient Rehabilitation Facilities with attached "Privacy Act [place] Records" was provided and verbally reviewed with: Patient  Emergency Contact Information Contact Information     Name Relation Home Work Mobile   [place] Sister   [phone]      Current Medical History  Patient Admitting Diagnosis: 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   History of Present Illness: [name] Pradia. [name] is a 69 year old right-handed female history of diabetes  mellitus, ACDF 2014, chronic pain syndrome, myasthenia gravis maintained on CellCept as well as chronic prednisone and receives IVIG at [facility], asthma /OSA on CPAP, thyroid cancer, hypertension, hyperlipidemia.  Per chart review lives alone independent prior to admission and driving.  1 level home.  She does have family in the area that worked during the day.  Presented 12/20/2019 with right side weakness.  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  Nuclear medicine perfusion scan no pulmonary embolus.  Patient with recent carotid Doppler noting stenosis right ICA greater than 70% left ICA stenosis 50 to 69%.  Echocardiogram ejection fraction [id number] grade 1 diastolic dysfunction.  Admission chemistries potassium 3.3, troponin negative, BNP 215.  Currently maintained on aspirin and Plavix for CVA prophylaxis.  Subcutaneous Lovenox for DVT prophylaxis.  Vascular surgery Dr. Donzetta Matters follow-up in regards to carotid stenosis no current plan for surgical intervention.  She is tolerating a regular diet.  Therapy evaluations completed and patient is to be admitted for a comprehensive rehab program on 12/27/19.  Complete NIHSS TOTAL: 2 Glasgow Coma Scale Score: 15  Past Medical History  Past Medical History:  Diagnosis Date  Arthritis    "all over my body" (03/13/2013)   Asthma    Asymptomatic carotid artery stenosis, bilateral 10/08/2018   GERD (gastroesophageal reflux disease)    H/O hiatal hernia    Heart murmur    Hypercholesterolemia 10/08/2018   Hypertension    Hypothyroidism    Myasthenia gravis ([place])    "in my eyes; diagnsosed > 7 yr ago" (03/13/2013)   Sleep apnea    on CPAP   Thyroid carcinoma ([place])    Type II diabetes mellitus ([place])     Family History  family history includes Asthma in her father and mother; Congestive Heart Failure in her father and mother; Coronary artery disease in her sister; Diabetes in her brother, father, mother, and sister; Lung cancer in her  father; Stroke (age of onset: 45) in her sister.  Prior Rehab/Hospitalizations:  Has the patient had prior rehab or hospitalizations prior to admission? No  Has the patient had major surgery during 100 days prior to admission? No  Current Medications   Current Facility-Administered Medications:    acetaminophen (TYLENOL) tablet 650 mg, 650 mg, Oral, Q4H PRN, 650 mg at 12/26/19 1426 **OR** acetaminophen (TYLENOL) 160 MG/5ML solution 650 mg, 650 mg, Per Tube, Q4H PRN **OR** acetaminophen (TYLENOL) suppository 650 mg, 650 mg, Rectal, Q4H PRN, Karmen Bongo, MD   albuterol (PROVENTIL) (2.5 MG/3ML) 0.083% nebulizer solution 2.5 mg, 2.5 mg, Nebulization, Q4H PRN, Karmen Bongo, MD   aspirin EC tablet 81 mg, 81 mg, Oral, Daily, Ghimire, Kuber, MD, 81 mg at 12/27/19 0955   Chlorhexidine Gluconate Cloth 2 % PADS 6 each, 6 each, Topical, Daily, Karmen Bongo, MD, 6 each at 12/27/19 [phone]   clopidogrel (PLAVIX) tablet 75 mg, 75 mg, Oral, Daily, Ghimire, Kuber, MD, 75 mg at 12/27/19 0954   cycloSPORINE (RESTASIS) 0.05 % ophthalmic emulsion 1 drop, 1 drop, Both Eyes, BID, Karmen Bongo, MD, 1 drop at 12/27/19 0957   diclofenac Sodium (VOLTAREN) 1 % topical gel 2 g, 2 g, Topical, QID, Ghimire, Kuber, MD, 2 g at 12/27/19 0954   enoxaparin (LOVENOX) injection 40 mg, 40 mg, Subcutaneous, Q24H, Karmen Bongo, MD, 40 mg at 12/26/19 1556   ezetimibe (ZETIA) tablet 10 mg, 10 mg, Oral, Daily, Karmen Bongo, MD, 10 mg at 12/27/19 0955   insulin aspart (novoLOG) injection 0-15 Units, 0-15 Units, Subcutaneous, TID WC, Ghimire, Kuber, MD, 2 Units at 12/27/19 0637   insulin aspart (novoLOG) injection 0-5 Units, 0-5 Units, Subcutaneous, QHS, Opyd, Timothy S, MD, 2 Units at 12/26/19 2210   insulin aspart (novoLOG) injection 15 Units, 15 Units, Subcutaneous, TID WC, Ghimire, Kuber, MD, 15 Units at 12/27/19 0636   insulin detemir (LEVEMIR) injection 40 Units, 40 Units, Subcutaneous, Daily, Ghimire, Dante Gang, MD, 40  Units at 12/27/19 0956   irbesartan (AVAPRO) tablet 150 mg, 150 mg, Oral, Daily, Ghimire, Kuber, MD, 150 mg at 12/27/19 0955   levothyroxine (SYNTHROID) tablet 137 mcg, 137 mcg, Oral, QAC breakfast, Karmen Bongo, MD, 137 mcg at 12/27/19 0638   lidocaine (LIDODERM) 5 % 1 patch, 1 patch, Transdermal, Q24H, Ghimire, Kuber, MD, 1 patch at 12/26/19 1205   montelukast (SINGULAIR) tablet 10 mg, 10 mg, Oral, Ledell Noss, MD, 10 mg at 12/27/19 [id number]   mycophenolate (CELLCEPT) capsule 1,500 mg, 1,500 mg, Oral, BID, Karmen Bongo, MD, 1,500 mg at 12/27/19 0958   pantoprazole (PROTONIX) EC tablet 40 mg, 40 mg, Oral, Daily, Karmen Bongo, MD, 40 mg at 12/27/19 0954   predniSONE (DELTASONE) tablet 10 mg, 10 mg, Oral,  Ledell Noss, MD, 10 mg at 12/27/19 [id number]   rosuvastatin (CRESTOR) tablet 40 mg, 40 mg, Oral, QHS, Karmen Bongo, MD, 40 mg at 12/26/19 2213   senna-docusate (Senokot-S) tablet 1 tablet, 1 tablet, Oral, QHS PRN, Karmen Bongo, MD, 1 tablet at 12/24/19 1006   sodium chloride flush (NS) 0.9 % injection 10-40 mL, 10-40 mL, Intracatheter, Q12H, Karmen Bongo, MD, 10 mL at 12/27/19 0959   sodium chloride flush (NS) 0.9 % injection 10-40 mL, 10-40 mL, Intracatheter, PRN, Karmen Bongo, MD, 10 mL at 12/21/19 1900   spironolactone (ALDACTONE) tablet 25 mg, 25 mg, Oral, Daily, Ghimire, Kuber, MD, 25 mg at 12/27/19 0955   zolpidem (AMBIEN) tablet 5 mg, 5 mg, Oral, QHS PRN, Karmen Bongo, MD, 5 mg at 12/25/19 2138  Patients Current Diet:  Diet Order             Diet - low sodium heart healthy        Diet Carb Modified        Diet heart healthy/carb modified Room service appropriate? Yes; Fluid consistency: Thin  Diet effective ____                Precautions / Restrictions Precautions Precautions: Fall Precaution Comments: pt reports no recent falls, "knees give out" about 2x/month Restrictions Weight Bearing Restrictions: No   Has the patient had 2 or  more falls or a fall with injury in the past year?No  Prior Activity Level Community (5-7x/wk): retired but active in the community; Independent PTA. still drives  Prior Functional Level Prior Function Level of Independence: Independent Comments: Pt independent in all ADL, IADL, and mobility tasks. Pt does not ambulate with an assistive device and reports 0 falls in the last 6 months. Pt still drives and is retired.   Self Care: Did the patient need help bathing, dressing, using the toilet or eating?  Independent  Indoor Mobility: Did the patient need assistance with walking from room to room (with or without device)? Independent  Stairs: Did the patient need assistance with internal or external stairs (with or without device)? Independent  Functional Cognition: Did the patient need help planning regular tasks such as shopping or remembering to take medications? Independent  Home Assistive Devices / Equipment Home Equipment: Kasandra Knudsen - single point, [place] - quad, Grab bars - tub/shower  Prior Device Use: Indicate devices/aids used by the patient prior to current illness, exacerbation or injury? None of the above  Current Functional Level Cognition  Arousal/Alertness: Awake/alert Overall Cognitive Status: Within Functional Limits for tasks assessed Orientation Level: Oriented X4 General Comments: Pt pleasant and willing to participate in therapy tasks. Pt able to follow multi-step instructions without difficulty.  Attention: Selective Selective Attention: Appears intact Memory: Appears intact Awareness: Appears intact Problem Solving: Appears intact Executive Function: Reasoning Reasoning: Appears intact    Extremity Assessment (includes Sensation/Coordination)  Upper Extremity Assessment: Defer to OT evaluation, RUE deficits/detail RUE Deficits / Details: pt reports numbness and tingling in RUE starting at wrist, generally 4-/5 RUE RUE Sensation: decreased light touch RUE  Coordination: decreased fine motor, decreased gross motor  Lower Extremity Assessment: RLE deficits/detail RLE Deficits / Details: pt reports slightly decreased sensation entire RLE, 4-/5 globally RLE Sensation: decreased light touch    ADLs  Overall ADL's : Needs assistance/impaired Eating/Feeding: Minimal assistance, Sitting Eating/Feeding Details (indicate cue type and reason): cues for integration and use of R hands into tasks (opening container, lids, using hand to hold utensitl to eat), intermittent assistance with  more difficult packages Grooming: Wash/dry hands, Dance movement psychotherapist, Oral care, Standing, Min guard, Supervision/safety Grooming Details (indicate cue type and reason): cues for involving RUE into ADLs Upper Body Bathing: Set up, Supervision/ safety, Sitting Lower Body Bathing: Supervison/ safety, Min guard, Sit to/from stand Upper Body Dressing : Minimal assistance, Sitting Upper Body Dressing Details (indicate cue type and reason): to new hospital gown as back side cover Lower Body Dressing: Min guard, Sitting/lateral leans Lower Body Dressing Details (indicate cue type and reason): to pull socks up from EOB Toilet Transfer: Minimal assistance, Ambulation Toilet Transfer Details (indicate cue type and reason): simulated via functional mobility; increased time and effort noted needing MIN A for balance and support on R side Toileting- Clothing Manipulation and Hygiene: Minimal assistance, Sitting/lateral lean, Sit to/from stand Toileting - Clothing Manipulation Details (indicate cue type and reason): s seated, heavy min a to stand and pull up underwear while using both hands to pull up Functional mobility during ADLs: Minimal assistance General ADL Comments: pt continues to present with RUE inattention, decreased endurance and decreased RUE [place] impacting pts ability to engage in ADLs. Session focus on increasing functional mobility distance, standing grooming tasks and RUE HEP  with theraputty    Mobility  Overal bed mobility: Needs Assistance Bed Mobility: Supine to Sit Rolling: Min assist Sidelying to sit: Min guard Supine to sit: Supervision, HOB elevated General bed mobility comments: pt OOB in chair upon arrival    Transfers  Overall transfer level: Needs assistance Equipment used: None Transfers: Sit to/from Stand Sit to Stand: Min assist Stand pivot transfers: Min assist, Mod assist General transfer comment: assist once in standing for balance; pt able to power up with min guard for safety and cues to use R hand     Ambulation / Gait / Stairs / Wheelchair Mobility  Ambulation/Gait Ambulation/Gait assistance: Mod assist Gait Distance (Feet): (100 ft with 2 standing rest breaks) Assistive device: 1 person hand held assist(and assist at trunk with gait belt) Gait Pattern/deviations: Decreased step length - left, Decreased stance time - right, Decreased dorsiflexion - right, Decreased weight shift to right, Step-to pattern, Step-through pattern General Gait Details: assist for weight shifting and balance; cues for sequencing; decreased R step clearance; LOB posteriorly  Gait velocity: decreased  Gait velocity interpretation: <1.8 ft/sec, indicate of risk for recurrent falls    Posture / Balance Balance Overall balance assessment: Needs assistance Sitting-balance support: Feet supported Sitting balance-Leahy Scale: Good Standing balance support: During functional activity, Single extremity supported, Bilateral upper extremity supported Standing balance-Leahy Scale: Poor Standing balance comment: brief period of unsupported standing at sink with MINA    Special needs/care consideration Diabetic management: yes Uses home CPAP     Previous Home Environment (from acute therapy documentation) Living Arrangements: Alone  Lives With: Alone Available Help at Discharge: Family, Available PRN/intermittently Type of Home: House Home Layout: One  level Home Access: Stairs to enter Entrance Stairs-Rails: None Entrance Stairs-Number of Steps: side: 2 without rail; front door: 5 without rail Bathroom Shower/Tub: Chiropodist: Standard Home Care Services: Yes Type of Home Care Services: Home RN Additional Comments: pt reports she is unsure of AD, it was all her mothers.  Discharge Living Setting Plans for Discharge Living Setting: Alone, House Type of Home at Discharge: House Discharge Home Layout: One level Discharge Home Access: Stairs to enter Entrance Stairs-Rails: None Entrance Stairs-Number of Steps: 2 Discharge Bathroom Shower/Tub: Tub/shower unit Discharge Bathroom Toilet: Standard Discharge Bathroom Accessibility: No How Accessible:  Other (comment)(pt not sure RW would fit. ) Does the patient have any problems obtaining your medications?: Yes (Describe)(states financial reasons)  Social/Family/Support Systems Patient Roles: Other (Comment)(lives independently; has sister in [place] who is close) Contact Information: sister: Pamala Hurry [phone] Anticipated Caregiver: Pamala Hurry can check in 1x/day if needed Anticipated Caregiver's Contact Information: see above Ability/Limitations of Caregiver: supervision  Caregiver Availability: Other (Comment)(1x/day if needed; pt has Mod I goals) Discharge Plan Discussed with Primary Caregiver: Yes(pt and Pamala Hurry) Is Caregiver In Agreement with Plan?: Yes Does Caregiver/Family have Issues with Lodging/Transportation while Pt is in Rehab?: No   Goals Patient/Family Goal for Rehab: PT/OT: Supervision/Mod I; SLP: NA Expected length of stay: 12-14 days  Pt/Family Agrees to Admission and willing to participate: Yes Program Orientation Provided & Reviewed with Pt/Caregiver Including Roles  & Responsibilities: Yes(pt and her sister )  Barriers to Discharge: Home environment access/layout, Lack of/limited family support  Barriers to Discharge Comments: steps to enter home;  limited family assist.    Decrease burden of Care through IP rehab admission: NA   Possible need for SNF placement upon discharge:Not anticipated; pt has Mod I goals and has a sister who can check in daily if needed.    Patient Condition: This patient's medical and functional status has changed since the consult dated: 12/21/19 in which the Rehabilitation Physician determined and documented that the patient's condition is appropriate for intensive rehabilitative care in an inpatient rehabilitation facility. See "History of Present Illness" (above) for medical update. Functional changes are: pt remains at [place] level for tranfers and while she has progressed in gait distance from 30 feet to 100 feet, her assistance level has decreased from [place] G to Mod A. Pt remains at [place] to [place] A ADLs. Patient's medical and functional status update has been discussed with the Rehabilitation physician and patient remains appropriate for inpatient rehabilitation. Will admit to inpatient rehab today.  Preadmission Screen Completed By:  Raechel Ache, OT, 12/27/2019 10:39 AM ______________________________________________________________________   Discussed status with Dr. Posey Pronto on 12/27/19 at 10:13AM and received approval for admission today.  Admission Coordinator:  Raechel Ache, time 10:13AM/Date 12/27/19.

## 2019-12-22 NOTE — Progress Notes (Signed)
CBG [id number], Dr. Sloan Leiter paged, order for levemir and sliding scale insulin

## 2019-12-22 NOTE — Progress Notes (Signed)
Inpatient Rehabilitation-Admissions Coordinator    I met with pt at the bedside to discuss goals and expectations of an inpatient rehab admission. We discussed program details, estimated length of stay, and anticipated functional outcomes. Pt very interested in this program and wants to pursue if insurance approves. Feel pt is a great CIR candidate. AC will begin insurance authorization process for possible admit.   Will follow up once there has been a determination.   Raechel Ache, OTR/L  Rehab Admissions Coordinator  [phone] 12/22/2019 2:03 PM

## 2019-12-22 NOTE — Evaluation (Signed)
Speech Language Pathology Evaluation Patient Details Name: [name] MRN: [id number] DOB: November 20, 1950 Today's Date: 12/22/2019 Time: 1211-1225 SLP Time Calculation (min) (ACUTE ONLY): 14 min  Problem List:  Patient Active Problem List   Diagnosis Date Noted  . Thalamic stroke ([place]) 12/20/2019  . Asymptomatic carotid artery stenosis, bilateral 10/08/2018  . Hypercholesterolemia 10/08/2018  . Headache around the eyes 11/12/2016  . Cervicalgia of occipito-atlanto-axial region 11/12/2016  . Moderate persistent asthma 07/29/2015  . Current use of beta blocker 07/29/2015  . Gastroesophageal reflux disease without esophagitis 07/29/2015  . OSA on CPAP 09/19/2014  . Diabetic neuropathy with neurologic complication ([place]) [id number]  . Myasthenia gravis in remission ([place]) 09/19/2014  . Dyspnea 08/13/2014  . 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 Cancer of thyroid ([place]) 07/27/2014  . OSA (obstructive sleep apnea) 07/24/2014  . DM (diabetes mellitus) type II uncontrolled with eye manifestation ([place]) 06/01/2014  . 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 Nocturia more than twice per night 06/01/2014  . Snoring 06/01/2014  . Insomnia due to medical condition 06/01/2014  . Neuroma 01/16/2014  . Pseudoclaudication 11/14/2013  . Heart palpitations 06/30/2013  . Sinus tachycardia 03/29/2013  . Myasthenia gravis ([place]) 03/13/2013  . Essential hypertension, benign 03/13/2013  . Asthma, chronic 03/13/2013   Past Medical History:  Past Medical History:  Diagnosis Date  . Arthritis    "all over my body" (03/13/2013)  . Asthma   . Asymptomatic carotid artery stenosis, bilateral 10/08/2018  . GERD (gastroesophageal reflux disease)   . H/O hiatal hernia   . Heart murmur   . Hypercholesterolemia 10/08/2018  . Hypertension   . Hypothyroidism   . Myasthenia gravis ([place])    "in my eyes; diagnsosed > 7 yr ago" (03/13/2013)  . Sleep apnea    on CPAP  . Thyroid carcinoma ([place])   . Type II  diabetes mellitus ([place])    Past Surgical History:  Past Surgical History:  Procedure Laterality Date  . ABDOMINAL HYSTERECTOMY    . ANTERIOR CERVICAL DECOMP/DISCECTOMY FUSION     "I've had severa ORs; always went in from the front" (03/13/2013)  . APPENDECTOMY    . CARDIAC CATHETERIZATION     "several" (03/13/2013)  . CARPAL TUNNEL RELEASE Right   . CATARACT EXTRACTION W/ INTRAOCULAR LENS  IMPLANT, BILATERAL Bilateral   . CHOLECYSTECTOMY    . KNEE ARTHROSCOPY Left   . SHOULDER ARTHROSCOPY W/ ROTATOR CUFF REPAIR Left   . TONSILLECTOMY    . TOTAL THYROIDECTOMY     HPI:  Pt is a 69 yo female presenting with c/o chest pain and R-sided face and arm weakness/numbness and tingling. Upon admission, imaging revealed acute L thalamic CVA. PMH includes: Myasthenia gravis, HTN, DM II, thyroid cancer, HCL, and OSA.   Assessment / Plan / Recommendation Clinical Impression  Pt presents with normal language with adequate expression/comprehension, clear and fluent speech, functional cognition with regard to short-term and working memory, awareness, and executive functioning during verbal tasks.  No acute care SLP f/u is needed. D/W pt and her sister.  Our service will sign off.     SLP Assessment  SLP Recommendation/Assessment: Patient does not need any further Speech Lanaguage Pathology Services SLP Visit Diagnosis: Cognitive communication deficit (R41.841)    Follow Up Recommendations  None    Frequency and Duration           SLP Evaluation Cognition  Overall Cognitive Status: Within Functional Limits for tasks assessed Arousal/Alertness: Awake/alert Orientation Level: Oriented X4 Attention: Selective  Selective Attention: Appears intact Memory: Appears intact Awareness: Appears intact Problem Solving: Appears intact Executive Function: Reasoning Reasoning: Appears intact       Comprehension  Auditory Comprehension Overall Auditory Comprehension: Appears within functional limits  for tasks assessed Reading Comprehension Reading Status: Not tested    Expression Expression Primary Mode of Expression: Verbal Verbal Expression Overall Verbal Expression: Appears within functional limits for tasks assessed Written Expression Dominant Hand: Right   Oral / Motor  Oral Motor/Sensory Function Overall Oral Motor/Sensory Function: Within functional limits Motor Speech Overall Motor Speech: Appears within functional limits for tasks assessed   GO                    [name] 12/22/2019, 12:29 PM  Neala Miggins L. Tivis Ringer, [place] Office number [phone] Pager [phone]

## 2019-12-23 LAB — GLUCOSE, CAPILLARY
Glucose-Capillary: 190 mg/dL — ABNORMAL HIGH (ref 70–99)
Glucose-Capillary: 190 mg/dL — ABNORMAL HIGH (ref 70–99)
Glucose-Capillary: 182 mg/dL — ABNORMAL HIGH (ref 70–99)
Glucose-Capillary: 431 mg/dL — ABNORMAL HIGH (ref 70–99)
Glucose-Capillary: 292 mg/dL — ABNORMAL HIGH (ref 70–99)
Glucose-Capillary: 100 mg/dL — ABNORMAL HIGH (ref 70–99)

## 2019-12-23 LAB — GLUCOSE, RANDOM: Glucose, Bld: 467 mg/dL — ABNORMAL HIGH (ref 70–99)

## 2019-12-23 MED ORDER — SPIRONOLACTONE 25 MG PO TABS
25.0000 mg | ORAL_TABLET | Freq: Every day | ORAL | Status: DC
  Administered 2019-12-23 – 2019-12-27 (×5): 25 mg via ORAL
  Filled 2019-12-23 (×5): qty 1

## 2019-12-23 MED ORDER — INSULIN ASPART 100 UNIT/ML ~~LOC~~ SOLN
15.0000 [IU] | Freq: Once | SUBCUTANEOUS | Status: AC
  Administered 2019-12-23: 15 [IU] via SUBCUTANEOUS

## 2019-12-23 MED ORDER — IRBESARTAN 150 MG PO TABS
150.0000 mg | ORAL_TABLET | Freq: Every day | ORAL | Status: DC
  Administered 2019-12-23 – 2019-12-27 (×5): 150 mg via ORAL
  Filled 2019-12-23 (×5): qty 1

## 2019-12-23 NOTE — Progress Notes (Signed)
Physical Therapy Treatment Patient Details Name: [name] MRN: [id number] DOB: [birth year] Today's Date: 12/23/2019    History of Present Illness Pt is a 69 yo female presenting with c/o chest pain and R-sided face and arm weakness/numbness and tingling. Upon admission, imaging revealed acute L thalamic CVA. PMH includes: Myasthenia gravis, HTN, DM II, thyroid cancer, HCL, and OSA.    PT Comments    Pt seated in recliner.  She reports her sugars have been high and she is not feeling the best.  Performed short bouts of gait with continues unsteadiness. Pt currently required moderate assistance to ambulate.  Post gt returned to bed and focused on LE strengthening and upper extremity postural exercises.  Pt reports decreased pain in her chest post session.  Continue to recommend aggressive rehab in a post acute setting.      Follow Up Recommendations  CIR;Supervision/Assistance - 24 hour     Equipment Recommendations  Rolling walker with 5" wheels;3in1 (PT)    Recommendations for Other Services       Precautions / Restrictions Precautions Precautions: Fall Precaution Comments: pt reports no recent falls, "knees give out" about 2x/month Restrictions Weight Bearing Restrictions: No    Mobility  Bed Mobility Overal bed mobility: Needs Assistance Bed Mobility: Sit to Supine     Supine to sit: Supervision;HOB elevated     General bed mobility comments: Pt able to return to bed unassisted.  Min guard for scooting and bridging.  Transfers Overall transfer level: Needs assistance Equipment used: None   Sit to Stand: Min assist         General transfer comment: min assistance to rise.  Cues for hand placement to and from seated surface.  Ambulation/Gait Ambulation/Gait assistance: Mod assist Gait Distance (Feet): 35 Feet(x2) Assistive device: 1 person hand held assist(with stand rest break holding to railing.) Gait Pattern/deviations: Trunk flexed;Decreased step length  - left;Decreased stance time - right;Decreased dorsiflexion - right;Decreased weight shift to right;Narrow base of support Gait velocity: decreased    General Gait Details: assist for weight shifting and balance; pt with difficulty advancing LLE today as well as R due to poor weight shifting to the R during L swing phase.   Stairs             Wheelchair Mobility    Modified Rankin (Stroke Patients Only) Modified Rankin (Stroke Patients Only) Pre-Morbid Rankin Score: Slight disability Modified Rankin: Moderately severe disability     Balance Overall balance assessment: Needs assistance   Sitting balance-Leahy Scale: Good       Standing balance-Leahy Scale: Poor Standing balance comment: reliant on Unilateral support and external assistance.                            Cognition Arousal/Alertness: Awake/alert Behavior During Therapy: WFL for tasks assessed/performed Overall Cognitive Status: Within Functional Limits for tasks assessed                                 General Comments: Pt pleasant and willing to participate in therapy tasks. Pt able to follow multi-step instructions without difficulty.       Exercises General Exercises - Lower Extremity Ankle Circles/Pumps: Both;20 reps;Supine Quad Sets: AROM;Both;10 reps;Supine Heel Slides: AROM;AAROM;Both;10 reps;Supine Hip ABduction/ADduction: AROM;Both;10 reps;Supine Straight Leg Raises: AROM;Both;10 reps;Supine Other Exercises Other Exercises: B shoulder shrugs x10, B scap retraction x10, cervical flexion/ext x10. Other  Exercises: Reverse prayer stretch on pillow rolled into a bolster.  Informed nurse to remove at 3 pm.    General Comments        Pertinent Vitals/Pain Pain Assessment: Faces Pain Score: 5  Pain Location: chest ( muscular) soreness R shoulder Pain Descriptors / Indicators: Tightness;Tingling Pain Intervention(s): Monitored during session;Repositioned    Home  Living                      Prior Function            PT Goals (current goals can now be found in the care plan section) Acute Rehab PT Goals Patient Stated Goal: return home Potential to Achieve Goals: Good Progress towards PT goals: Progressing toward goals    Frequency    Min 4X/week      PT Plan Current plan remains appropriate    Co-evaluation              AM-PAC PT "6 Clicks" Mobility   Outcome Measure  Help needed turning from your back to your side while in a flat bed without using bedrails?: None Help needed moving from lying on your back to sitting on the side of a flat bed without using bedrails?: A Little Help needed moving to and from a bed to a chair (including a wheelchair)?: A Little Help needed standing up from a chair using your arms (e.g., wheelchair or bedside chair)?: A Little Help needed to walk in hospital room?: A Lot Help needed climbing 3-5 steps with a railing? : A Lot 6 Click Score: 17    End of Session Equipment Utilized During Treatment: Gait belt Activity Tolerance: Patient tolerated treatment well Patient left: with call bell/phone within reach;in bed;with bed alarm set Nurse Communication: Mobility status(to remove pillow roll from back at 3 pm) PT Visit Diagnosis: History of falling (Z91.81);Muscle weakness (generalized) (M62.81);Difficulty in walking, not elsewhere classified (R26.2)     Time: [id number] PT Time Calculation (min) (ACUTE ONLY): 24 min  Charges:  $Gait Training: 8-22 mins $Therapeutic Exercise: 8-22 mins                     Erasmo Leventhal , PTA Acute Rehabilitation Services Pager [phone] Office [place] Suhayla [place] 12/23/2019, 3:11 PM

## 2019-12-23 NOTE — Progress Notes (Signed)
PROGRESS NOTE    [name]  [id number] DOB: [birth year] DOA: 12/19/2019 PCP: [name], DO    Brief Narrative:  69 year old female with extensive medical issues including type 2 diabetes on insulin pump, myasthenia gravis, thyroid cancer status post resection, obstructive sleep apnea on CPAP at night, hypertension, hyperlipidemia, carotid stenosis who presents to the hospital with right face and right arm weakness and numbness as well as chest discomfort.  Initially evaluated in the ER for chest pain, VQ scan was negative.  Ruled out of acute coronary syndrome.  Also found to have right sided hemiparesis and acute stroke.   Assessment & Plan:   Principal Problem:   Thalamic stroke ([place]) Active Problems:   Myasthenia gravis ([place])   Essential hypertension, benign   DM (diabetes mellitus) type II uncontrolled with eye manifestation ([facility])   OSA (obstructive sleep apnea)   Cancer of thyroid ([facility])   Asymptomatic carotid artery stenosis, bilateral   Hypercholesterolemia  Chest pain: Atypical.  Acute coronary syndrome ruled out.  On aspirin Plavix now. She has palpable tenderness along the left costal margins and below breast line.  Improved with lidocaine patches.  Acute left thalamic stroke: Clinical findings, right facial sensory loss, right upper extremity motor and sensory loss. MRI of the brain, left thalamic stroke, acute ischemic stroke suspected small vessel disease. Carotid Doppler, known coronary artery disease, not contributing to current stroke. 2D echocardiogram, normal ejection fraction.  No source of emboli. Antiplatelet therapy, none at home.  Started on aspirin and Plavix for 3 weeks, then aspirin alone. LDL, 101.  Already on a statin.   Hemoglobin A1c, 9.8.  She has an insulin pump, has endocrinologist who is gradually working her up for insulin doses. DVT prophylaxis, Lovenox subcu Therapy recommendations, acute inpatient rehab  Hypertension: Allow  permissive hypertension.  Holding Benicar and Aldactone.  Hyperlipidemia: Continue Crestor and Zetia.  LDL 100.  Type 2 diabetes with hyperglycemia, uncontrolled: Working with endocrinologist to control her blood sugars.  She will use long-acting insulin and prandial insulin while in the hospital.  We will continue to adjust doses.  Today no changes.  Myasthenia gravis: Patient on maintenance Rituxan injection every 2 weeks that was she will keep up outpatient.  Patient on prednisone and CellCept that she will continue.    DVT prophylaxis: Lovenox subcu Code Status: Full code Family Communication: None Disposition Plan: Status is: Inpatient  Remains inpatient appropriate because:Inpatient level of care appropriate due to severity of illness   Dispo: The patient is from: Home              Anticipated d/c is to: CIR              Anticipated d/c date is: 1 day              Patient currently is medically stable to d/c.          Consultants:   Neurology  Procedures:   None  Antimicrobials:   None   Subjective: Patient seen and examined.  No new complaints.  Lidocaine patch did help with the rib pain.  Objective: Vitals:   12/22/19 2046 12/22/19 2326 12/23/19 0333 12/23/19 0846  BP: 139/62 117/61 (!) 139/58 (!) 142/54  Pulse: 93 91 92 92  Resp: 18 18 15 18   Temp: 98.2 F (36.8 C) 98.3 F (36.8 C) 97.6 F (36.4 C) 97.7 F (36.5 C)  TempSrc: Oral Oral Oral Oral  SpO2: 99% 96% 97% 97%  Weight:  Height:        Intake/Output Summary (Last 24 hours) at 12/23/2019 1059 Last data filed at 12/23/2019 0600 Gross per 24 hour  Intake 270 ml  Output --  Net 270 ml   Filed Weights   12/19/19 1034  Weight: 81.6 kg    Examination:  General exam: Appears calm and comfortable  Respiratory system: Clear to auscultation. Respiratory effort normal. Cardiovascular system: S1 & S2 heard, RRR. No JVD, murmurs, rubs, gallops or clicks Palpable tenderness along the  left costal line, below breast on the ribs. Gastrointestinal system: Abdomen is nondistended, soft and nontender. No organomegaly or masses felt. Normal bowel sounds heard. Central nervous system: Alert and oriented. No focal neurological deficits. Extremities: Superficial sensation loss on the right side of the face. Right upper extremity and lower extremity 4/5.   Skin: No rashes, lesions or ulcers Psychiatry: Judgement and insight appear normal. Mood & affect appropriate.     Data Reviewed: I have personally reviewed following labs and imaging studies  CBC: Recent Labs  Lab 12/19/19 1141  WBC 6.7  HGB 11.8*  HCT 36.2  MCV 94.3  PLT 0000000   Basic Metabolic Panel: Recent Labs  Lab 12/19/19 1141  NA 140  K 3.3*  CL 106  CO2 24  GLUCOSE 124*  BUN 20  CREATININE 0.69  CALCIUM 10.1   GFR: Estimated Creatinine Clearance: 70 mL/min (by C-G formula based on SCr of 0.69 mg/dL). Liver Function Tests: Recent Labs  Lab 12/19/19 1141  AST 22  ALT 24  ALKPHOS 98  BILITOT 0.5  PROT 7.8  ALBUMIN 3.8   No results for input(s): LIPASE, AMYLASE in the last 168 hours. No results for input(s): AMMONIA in the last 168 hours. Coagulation Profile: No results for input(s): INR, PROTIME in the last 168 hours. Cardiac Enzymes: No results for input(s): CKTOTAL, CKMB, CKMBINDEX, TROPONINI in the last 168 hours. BNP (last 3 results) No results for input(s): PROBNP in the last 8760 hours. HbA1C: Recent Labs    12/20/19 1823  HGBA1C 9.1*   CBG: Recent Labs  Lab 12/22/19 1920 12/22/19 2139 12/23/19 0330 12/23/19 0619 12/23/19 0758  GLUCAP 137* 130* 190* 190* 182*   Lipid Profile: Recent Labs    12/21/19 0618  CHOL 193  HDL 45  LDLCALC 101*  TRIG 237*  CHOLHDL 4.3   Thyroid Function Tests: Recent Labs    12/20/19 1823  TSH 0.991   Anemia Panel: No results for input(s): VITAMINB12, FOLATE, FERRITIN, TIBC, IRON, RETICCTPCT in the last 72 hours. Sepsis Labs: No  results for input(s): PROCALCITON, LATICACIDVEN in the last 168 hours.  Recent Results (from the past 240 hour(s))  Respiratory Panel by RT PCR (Flu A&B, Covid) - Nasopharyngeal Swab     Status: None   Collection Time: 12/20/19  3:00 PM   Specimen: Nasopharyngeal Swab  Result Value Ref Range Status   SARS Coronavirus 2 by RT PCR NEGATIVE NEGATIVE Final    Comment: (NOTE) SARS-CoV-2 target nucleic acids are NOT DETECTED. The SARS-CoV-2 RNA is generally detectable in upper respiratoy specimens during the acute phase of infection. The lowest concentration of SARS-CoV-2 viral copies this assay can detect is 131 copies/mL. A negative result does not preclude SARS-Cov-2 infection and should not be used as the sole basis for treatment or other patient management decisions. A negative result may occur with  improper specimen collection/handling, submission of specimen other than nasopharyngeal swab, presence of viral mutation(s) within the areas targeted by this assay,  and inadequate number of viral copies (<131 copies/mL). A negative result must be combined with clinical observations, patient history, and epidemiological information. The expected result is Negative. Fact Sheet for Patients:  PinkCheek.be Fact Sheet for Healthcare Providers:  GravelBags.it This test is not yet ap proved or cleared by the [place] FDA and  has been authorized for detection and/or diagnosis of SARS-CoV-2 by FDA under an Emergency Use Authorization (EUA). This EUA will remain  in effect (meaning this test can be used) for the duration of the COVID-19 declaration under Section 564(b)(1) of the Act, 21 U.S.C. section 360bbb-3(b)(1), unless the authorization is terminated or revoked sooner.    Influenza A by PCR NEGATIVE NEGATIVE Final   Influenza B by PCR NEGATIVE NEGATIVE Final    Comment: (NOTE) The Xpert Xpress SARS-CoV-2/FLU/RSV assay is  intended as an aid in  the diagnosis of influenza from Nasopharyngeal swab specimens and  should not be used as a sole basis for treatment. Nasal washings and  aspirates are unacceptable for Xpert Xpress SARS-CoV-2/FLU/RSV  testing. Fact Sheet for Patients: PinkCheek.be Fact Sheet for Healthcare Providers: GravelBags.it This test is not yet approved or cleared by the [place] FDA and  has been authorized for detection and/or diagnosis of SARS-CoV-2 by  FDA under an Emergency Use Authorization (EUA). This EUA will remain  in effect (meaning this test can be used) for the duration of the  Covid-19 declaration under Section 564(b)(1) of the Act, 21  U.S.C. section 360bbb-3(b)(1), unless the authorization is  terminated or revoked. Performed at [facility], [place] [address] [place], [place] [address]          Radiology Studies: MR ANGIO HEAD WO CONTRAST  Result Date: 12/21/2019 CLINICAL DATA:  Left thalamic stroke EXAM: MRA HEAD WITHOUT CONTRAST TECHNIQUE: Angiographic images of the Circle of Willis were obtained using MRA technique without intravenous contrast. COMPARISON:  MRI head 12/20/2019 FINDINGS: Both vertebral arteries widely patent. Right PICA patent. Left PICA is small and appears to arise from the distal left vertebral artery. Right AICA patent. Basilar widely patent. Superior cerebellar and posterior cerebral arteries patent bilaterally. Internal carotid artery widely patent bilaterally. Anterior and middle cerebral arteries patent. Mild stenosis of the origin of a branch of the superior division of the right middle cerebral artery. This vessel also shows a moderately severe distal stenosis. Negative for cerebral aneurysm. IMPRESSION: Atherosclerotic disease right middle cerebral artery branch otherwise negative. No large vessel occlusion. Electronically Signed   By: Franchot Gallo M.D.   On: 12/21/2019 17:28         Scheduled Meds: . aspirin EC  81 mg Oral Daily  . Chlorhexidine Gluconate Cloth  6 each Topical Daily  . clopidogrel  75 mg Oral Daily  . cycloSPORINE  1 drop Both Eyes BID  . enoxaparin (LOVENOX) injection  40 mg Subcutaneous Q24H  . ezetimibe  10 mg Oral Daily  . insulin aspart  0-15 Units Subcutaneous TID WC  . insulin aspart  0-5 Units Subcutaneous QHS  . insulin aspart  12 Units Subcutaneous TID WC  . insulin detemir  35 Units Subcutaneous Daily  . irbesartan  150 mg Oral Daily  . levothyroxine  137 mcg Oral QAC breakfast  . lidocaine  1 patch Transdermal Q24H  . montelukast  10 mg Oral BH-q7a  . mycophenolate  1,500 mg Oral BID  . pantoprazole  40 mg Oral Daily  . predniSONE  10 mg Oral BH-q7a  . rosuvastatin  40 mg Oral QHS  . sodium chloride flush  10-40 mL Intracatheter Q12H  . spironolactone  25 mg Oral Daily   Continuous Infusions:   LOS: 3 days    Time spent: 30 minutes    Barb Merino, MD [facility] Pager [phone]

## 2019-12-24 LAB — GLUCOSE, CAPILLARY
Glucose-Capillary: 175 mg/dL — ABNORMAL HIGH (ref 70–99)
Glucose-Capillary: 195 mg/dL — ABNORMAL HIGH (ref 70–99)
Glucose-Capillary: 279 mg/dL — ABNORMAL HIGH (ref 70–99)
Glucose-Capillary: 241 mg/dL — ABNORMAL HIGH (ref 70–99)
Glucose-Capillary: 134 mg/dL — ABNORMAL HIGH (ref 70–99)

## 2019-12-24 NOTE — Progress Notes (Signed)
PROGRESS NOTE    [name]  [id number] DOB: [birth year] DOA: 12/19/2019 PCP: [name], DO    Brief Narrative:  69 year old female with extensive medical issues including type 2 diabetes on insulin pump, myasthenia gravis, thyroid cancer status post resection, obstructive sleep apnea on CPAP at night, hypertension, hyperlipidemia, carotid stenosis who presents to the hospital with right face and right arm weakness and numbness as well as chest discomfort.  Initially evaluated in the ER for chest pain, VQ scan was negative.  Ruled out of acute coronary syndrome.  Also found to have right sided hemiparesis and acute stroke.   Assessment & Plan:   Principal Problem:   Thalamic stroke ([place]) Active Problems:   Myasthenia gravis ([place])   Essential hypertension, benign   DM (diabetes mellitus) type II uncontrolled with eye manifestation (HCC)   OSA (obstructive sleep apnea)   Cancer of thyroid (HCC)   Asymptomatic carotid artery stenosis, bilateral   Hypercholesterolemia  Chest pain: Atypical.  Acute coronary syndrome ruled out.  On aspirin Plavix now. She has palpable tenderness along the left costal margins and below breast line.  Improved with lidocaine patches.  Acute left thalamic stroke: Clinical findings, right facial sensory loss, right upper extremity motor and sensory loss. MRI of the brain, left thalamic stroke, acute ischemic stroke suspected small vessel disease. Carotid Doppler, known coronary artery disease, not contributing to current stroke. 2D echocardiogram, normal ejection fraction.  No source of emboli. Antiplatelet therapy, none at home.  Started on aspirin and Plavix for 3 weeks, then aspirin alone. LDL, 101.  Already on a statin.   Hemoglobin A1c, 9.8.  She has an insulin pump, has endocrinologist who is gradually working her up for insulin doses. DVT prophylaxis, Lovenox subcu Therapy recommendations, acute inpatient rehab  Hypertension: Allow  permissive hypertension.  Holding Benicar and Aldactone.  Hyperlipidemia: Continue Crestor and Zetia.  LDL 100.  Type 2 diabetes with hyperglycemia, uncontrolled: Using insulin pump at home.  Started on similar dose of long-acting insulin and prandial insulin.  We will continue to titrate.  Myasthenia gravis: Patient on maintenance Rituxan injection every 2 weeks that was she will keep up outpatient.  Patient on prednisone and CellCept that she will continue.    DVT prophylaxis: Lovenox subcu Code Status: Full code Family Communication: None Disposition Plan: Status is: Inpatient  Remains inpatient appropriate because:Inpatient level of care appropriate due to severity of illness   Dispo: The patient is from: Home              Anticipated d/c is to: CIR              Anticipated d/c date is: 1 day              Patient currently is medically stable to d/c.          Consultants:   Neurology  Procedures:   None  Antimicrobials:   None   Subjective: No new events.  Lidocaine patch is helping with the chest pain.  Almost gone.  Objective: Vitals:   12/23/19 2353 12/24/19 0430 12/24/19 0844 12/24/19 1310  BP: (!) 107/53 (!) 112/54 (!) 128/58 (!) 139/56  Pulse: 89 87 93 (!) 106  Resp: 16 18 18 18   Temp: 98.2 F (36.8 C) 98.1 F (36.7 C) (!) 97.5 F (36.4 C) 97.9 F (36.6 C)  TempSrc: Oral Oral Oral Oral  SpO2: 98% 99% 96% 98%  Weight:      Height:  Intake/Output Summary (Last 24 hours) at 12/24/2019 1314 Last data filed at 12/24/2019 0630 Gross per 24 hour  Intake 540 ml  Output --  Net 540 ml   Filed Weights   12/19/19 1034  Weight: 81.6 kg    Examination:  General exam: Appears calm and comfortable, on room air. Respiratory system: Clear to auscultation. Respiratory effort normal. Cardiovascular system: S1 & S2 heard, RRR. No JVD, murmurs, rubs, gallops or clicks Palpable tenderness along the left costal line, below breast on the  ribs. Gastrointestinal system: Abdomen is nondistended, soft and nontender. No organomegaly or masses felt. Normal bowel sounds heard. Central nervous system: Alert and oriented. No focal neurological deficits. Extremities: Superficial sensation loss on the right side of the face. Right upper extremity and lower extremity 4/5.   Skin: No rashes, lesions or ulcers Psychiatry: Judgement and insight appear normal. Mood & affect appropriate.     Data Reviewed: I have personally reviewed following labs and imaging studies  CBC: Recent Labs  Lab 12/19/19 1141  WBC 6.7  HGB 11.8*  HCT 36.2  MCV 94.3  PLT 0000000   Basic Metabolic Panel: Recent Labs  Lab 12/19/19 1141 12/23/19 1300  NA 140  --   K 3.3*  --   CL 106  --   CO2 24  --   GLUCOSE 124* 467*  BUN 20  --   CREATININE 0.69  --   CALCIUM 10.1  --    GFR: Estimated Creatinine Clearance: 70 mL/min (by C-G formula based on SCr of 0.69 mg/dL). Liver Function Tests: Recent Labs  Lab 12/19/19 1141  AST 22  ALT 24  ALKPHOS 98  BILITOT 0.5  PROT 7.8  ALBUMIN 3.8   No results for input(s): LIPASE, AMYLASE in the last 168 hours. No results for input(s): AMMONIA in the last 168 hours. Coagulation Profile: No results for input(s): INR, PROTIME in the last 168 hours. Cardiac Enzymes: No results for input(s): CKTOTAL, CKMB, CKMBINDEX, TROPONINI in the last 168 hours. BNP (last 3 results) No results for input(s): PROBNP in the last 8760 hours. HbA1C: No results for input(s): HGBA1C in the last 72 hours. CBG: Recent Labs  Lab 12/23/19 1658 12/23/19 2128 12/24/19 0309 12/24/19 0608 12/24/19 1214  GLUCAP 292* 100* 175* 195* 279*   Lipid Profile: No results for input(s): CHOL, HDL, LDLCALC, TRIG, CHOLHDL, LDLDIRECT in the last 72 hours. Thyroid Function Tests: No results for input(s): TSH, T4TOTAL, FREET4, T3FREE, THYROIDAB in the last 72 hours. Anemia Panel: No results for input(s): VITAMINB12, FOLATE, FERRITIN,  TIBC, IRON, RETICCTPCT in the last 72 hours. Sepsis Labs: No results for input(s): PROCALCITON, LATICACIDVEN in the last 168 hours.  Recent Results (from the past 240 hour(s))  Respiratory Panel by RT PCR (Flu A&B, Covid) - Nasopharyngeal Swab     Status: None   Collection Time: 12/20/19  3:00 PM   Specimen: Nasopharyngeal Swab  Result Value Ref Range Status   SARS Coronavirus 2 by RT PCR NEGATIVE NEGATIVE Final    Comment: (NOTE) SARS-CoV-2 target nucleic acids are NOT DETECTED. The SARS-CoV-2 RNA is generally detectable in upper respiratoy specimens during the acute phase of infection. The lowest concentration of SARS-CoV-2 viral copies this assay can detect is 131 copies/mL. A negative result does not preclude SARS-Cov-2 infection and should not be used as the sole basis for treatment or other patient management decisions. A negative result may occur with  improper specimen collection/handling, submission of specimen other than nasopharyngeal swab, presence of  viral mutation(s) within the areas targeted by this assay, and inadequate number of viral copies (<131 copies/mL). A negative result must be combined with clinical observations, patient history, and epidemiological information. The expected result is Negative. Fact Sheet for Patients:  PinkCheek.be Fact Sheet for Healthcare Providers:  GravelBags.it This test is not yet ap proved or cleared by the [place] FDA and  has been authorized for detection and/or diagnosis of SARS-CoV-2 by FDA under an Emergency Use Authorization (EUA). This EUA will remain  in effect (meaning this test can be used) for the duration of the COVID-19 declaration under Section 564(b)(1) of the Act, 21 U.S.C. section 360bbb-3(b)(1), unless the authorization is terminated or revoked sooner.    Influenza A by PCR NEGATIVE NEGATIVE Final   Influenza B by PCR NEGATIVE NEGATIVE Final     Comment: (NOTE) The Xpert Xpress SARS-CoV-2/FLU/RSV assay is intended as an aid in  the diagnosis of influenza from Nasopharyngeal swab specimens and  should not be used as a sole basis for treatment. Nasal washings and  aspirates are unacceptable for Xpert Xpress SARS-CoV-2/FLU/RSV  testing. Fact Sheet for Patients: PinkCheek.be Fact Sheet for Healthcare Providers: GravelBags.it This test is not yet approved or cleared by the [place] FDA and  has been authorized for detection and/or diagnosis of SARS-CoV-2 by  FDA under an Emergency Use Authorization (EUA). This EUA will remain  in effect (meaning this test can be used) for the duration of the  Covid-19 declaration under Section 564(b)(1) of the Act, 21  U.S.C. section 360bbb-3(b)(1), unless the authorization is  terminated or revoked. Performed at [facility], [place] [address] [place], [place] [address]          Radiology Studies: No results found.      Scheduled Meds: . aspirin EC  81 mg Oral Daily  . Chlorhexidine Gluconate Cloth  6 each Topical Daily  . clopidogrel  75 mg Oral Daily  . cycloSPORINE  1 drop Both Eyes BID  . enoxaparin (LOVENOX) injection  40 mg Subcutaneous Q24H  . ezetimibe  10 mg Oral Daily  . insulin aspart  0-15 Units Subcutaneous TID WC  . insulin aspart  0-5 Units Subcutaneous QHS  . insulin aspart  12 Units Subcutaneous TID WC  . insulin detemir  35 Units Subcutaneous Daily  . irbesartan  150 mg Oral Daily  . levothyroxine  137 mcg Oral QAC breakfast  . lidocaine  1 patch Transdermal Q24H  . montelukast  10 mg Oral BH-q7a  . mycophenolate  1,500 mg Oral BID  . pantoprazole  40 mg Oral Daily  . predniSONE  10 mg Oral BH-q7a  . rosuvastatin  40 mg Oral QHS  . sodium chloride flush  10-40 mL Intracatheter Q12H  . spironolactone  25 mg Oral Daily   Continuous Infusions:   LOS: 4 days    Time spent: 30  minutes    Barb Merino, MD [facility] Pager [phone]

## 2019-12-25 LAB — RAPID URINE DRUG SCREEN, HOSP PERFORMED
Amphetamines: NOT DETECTED
Barbiturates: NOT DETECTED
Benzodiazepines: NOT DETECTED
Cocaine: NOT DETECTED
Opiates: NOT DETECTED
Tetrahydrocannabinol: NOT DETECTED

## 2019-12-25 LAB — GLUCOSE, CAPILLARY
Glucose-Capillary: 159 mg/dL — ABNORMAL HIGH (ref 70–99)
Glucose-Capillary: 294 mg/dL — ABNORMAL HIGH (ref 70–99)
Glucose-Capillary: 228 mg/dL — ABNORMAL HIGH (ref 70–99)
Glucose-Capillary: 255 mg/dL — ABNORMAL HIGH (ref 70–99)

## 2019-12-25 MED ORDER — DICLOFENAC SODIUM 1 % EX GEL
2.0000 g | Freq: Four times a day (QID) | CUTANEOUS | Status: DC
  Administered 2019-12-25 – 2019-12-27 (×10): 2 g via TOPICAL
  Filled 2019-12-25: qty 100

## 2019-12-25 NOTE — Progress Notes (Signed)
Occupational Therapy Treatment Patient Details Name: [name] MRN: [id number] DOB: 1951-01-02 Today's Date: 12/25/2019    History of present illness Pt is a 69 yo female presenting with c/o chest pain and R-sided face and arm weakness/numbness and tingling. Upon admission, imaging revealed acute L thalamic CVA. PMH includes: Myasthenia gravis, HTN, DM II, thyroid cancer, HCL, and OSA.   OT comments  Pt making steady progress towards OT goals this session. Session focus on functional mobility, standing grooming tasks and RUE HEP. Overall, pt requires MIN A for household distance functional mobility with no AD needing MINA for balance on R side. Pt complete standing ADL tasks at sink needing frequent cues to incorporate RUE into ADLs. Pt completed RUE therex as indicated below. Issued pt level 1 theraputty and written HEP to increase carryover. Pt continues to be an excellent CIR candidate d/t continued progress with therapies and consistent motivation to return to PLOF. Will continue to follow acutely per POC.    Follow Up Recommendations  CIR;Supervision/Assistance - 24 hour    Equipment Recommendations  3 in 1 bedside commode    Recommendations for Other Services      Precautions / Restrictions Precautions Precautions: Fall Precaution Comments: pt reports no recent falls, "knees give out" about 2x/month Restrictions Weight Bearing Restrictions: No       Mobility Bed Mobility Overal bed mobility: Needs Assistance Bed Mobility: Supine to Sit     Supine to sit: Supervision;HOB elevated     General bed mobility comments: supervision for safety with HOB elevated and use of rails  Transfers Overall transfer level: Needs assistance Equipment used: None Transfers: Sit to/from Stand Sit to Stand: Min assist         General transfer comment: min assistance to rise.  Cues for hand placement to and from seated surface.    Balance Overall balance assessment: Needs  assistance Sitting-balance support: Feet supported Sitting balance-Leahy Scale: Good     Standing balance support: During functional activity;No upper extremity supported Standing balance-Leahy Scale: Poor Standing balance comment: brief period of unsupported standing at sink with MINA                           ADL either performed or assessed with clinical judgement   ADL Overall ADL's : Needs assistance/impaired     Grooming: Wash/dry hands;Wash/dry face;Oral care;Standing;Min guard;Supervision/safety Grooming Details (indicate cue type and reason): cues for involving RUE into ADLs         Upper Body Dressing : Minimal assistance;Sitting Upper Body Dressing Details (indicate cue type and reason): to new hospital gown as back side cover Lower Body Dressing: Min guard;Sitting/lateral leans Lower Body Dressing Details (indicate cue type and reason): to pull socks up from EOB Toilet Transfer: Minimal assistance;Ambulation Toilet Transfer Details (indicate cue type and reason): simulated via functional mobility; increased time and effort noted needing MIN A for balance and support on R side         Functional mobility during ADLs: Minimal assistance General ADL Comments: pt continues to present with RUE inattention, decreased endurance and decreased RUE G[place] Blue impacting pts ability to engage in ADLs. Session focus on increasing functional mobility distance, standing grooming tasks and RUE HEP with theraputty     Vision       Perception     Praxis      Cognition Arousal/Alertness: Awake/alert Behavior During Therapy: WFL for tasks assessed/performed Overall Cognitive Status: Within Functional Limits for  tasks assessed                                          Exercises Hand Exercises Forearm Supination: AROM;Right;Seated;5 reps Forearm Pronation: AROM;Right;5 reps;Seated Opposition: AROM;Right;5 reps;Seated Other Exercises Other Exercises:  RUE digit ABD/ADD; level 1 theraputty with written HEP ( compostie digit flexion/ extension( needing assist from LUE to extend digits), pinceer grasp   Shoulder Instructions       General Comments VSS; issued level 1 theraputty with written HEP    Pertinent Vitals/ Pain       Pain Assessment: Faces Faces Pain Scale: No hurt  Home Living                                          Prior Functioning/Environment              Frequency  Min 3X/week        Progress Toward Goals  OT Goals(current goals can now be found in the care plan section)  Progress towards OT goals: Progressing toward goals  Acute Rehab OT Goals Patient Stated Goal: return home Time For Goal Achievement: 01/04/20 Potential to Achieve Goals: Good  Plan Discharge plan remains appropriate    Co-evaluation    PT/OT/SLP Co-Evaluation/Treatment: Yes Reason for Co-Treatment: To address functional/ADL transfers          AM-PAC OT "6 Clicks" Daily Activity     Outcome Measure   Help from another person eating meals?: None Help from another person taking care of personal grooming?: A Little Help from another person toileting, which includes using toliet, bedpan, or urinal?: A Little Help from another person bathing (including washing, rinsing, drying)?: A Little Help from another person to put on and taking off regular upper body clothing?: A Little Help from another person to put on and taking off regular lower body clothing?: A Little 6 Click Score: 19    End of Session Equipment Utilized During Treatment: Gait belt  OT Visit Diagnosis: Unsteadiness on feet (R26.81);Muscle weakness (generalized) (M62.81);Hemiplegia and hemiparesis Hemiplegia - Right/Left: Right Hemiplegia - dominant/non-dominant: Dominant Hemiplegia - caused by: Cerebral infarction   Activity Tolerance Patient tolerated treatment well   Patient Left in chair;with call bell/phone within reach;with chair  alarm set   Nurse Communication          Time: 1025-1101 OT Time Calculation (min): 36 min  Charges: OT General Charges $OT Visit: 1 Visit OT Treatments $Self Care/Home Management : 8-22 mins $Therapeutic Exercise: 8-22 mins  Lanier Clam., COTA/L Acute Rehabilitation Services [phone]    Ihor Gully 12/25/2019, 11:14 AM

## 2019-12-25 NOTE — Progress Notes (Signed)
Physical Therapy Treatment Patient Details Name: [name] MRN: [id number] DOB: [birth year] Today's Date: 12/25/2019    History of Present Illness Pt is a 69 yo female presenting with c/o chest pain and R-sided face and arm weakness/numbness and tingling. Upon admission, imaging revealed acute L thalamic CVA. PMH includes: Myasthenia gravis, HTN, DM II, thyroid cancer, HCL, and OSA.    PT Comments    Patient is making progress toward PT goals and tolerated session well. Pt remains a great candidate for CIR level therapies to maximize independence and safety with mobility.     Follow Up Recommendations  CIR;Supervision/Assistance - 24 hour     Equipment Recommendations  Rolling walker with 5" wheels;3in1 (PT)    Recommendations for Other Services Rehab consult     Precautions / Restrictions Precautions Precautions: Fall Precaution Comments: pt reports no recent falls, "knees give out" about 2x/month Restrictions Weight Bearing Restrictions: No    Mobility  Bed Mobility Overal bed mobility: Needs Assistance Bed Mobility: Supine to Sit     Supine to sit: Supervision;HOB elevated     General bed mobility comments: pt up with OT upon arrival   Transfers Overall transfer level: Needs assistance Equipment used: None Transfers: Sit to/from Stand Sit to Stand: Min assist         General transfer comment: min assistance to rise.  Cues for hand placement to and from seated surface.  Ambulation/Gait Ambulation/Gait assistance: Mod assist;+2 safety/equipment(chair follow for safety with increased distance ) Gait Distance (Feet): (40-50 ft X 2 trials with seated rest break) Assistive device: 1 person hand held assist(and assist at trunk with gait belt) Gait Pattern/deviations: Trunk flexed;Decreased step length - left;Decreased stance time - right;Decreased dorsiflexion - right;Decreased weight shift to right;Narrow base of support;Step-to pattern;Step-through  pattern Gait velocity: decreased    General Gait Details: assist for weight shifting and balance; cues for sequencing; decreased R step clearance    Stairs             Wheelchair Mobility    Modified Rankin (Stroke Patients Only) Modified Rankin (Stroke Patients Only) Pre-Morbid Rankin Score: Slight disability Modified Rankin: Moderately severe disability     Balance Overall balance assessment: Needs assistance Sitting-balance support: Feet supported Sitting balance-Leahy Scale: Good     Standing balance support: During functional activity;Single extremity supported;Bilateral upper extremity supported Standing balance-Leahy Scale: Poor Standing balance comment: brief period of unsupported standing at sink with MINA                            Cognition Arousal/Alertness: Awake/alert Behavior During Therapy: WFL for tasks assessed/performed Overall Cognitive Status: Within Functional Limits for tasks assessed                                        Exercises Hand Exercises Forearm Supination: AROM;Right;Seated;5 reps Forearm Pronation: AROM;Right;5 reps;Seated Opposition: AROM;Right;5 reps;Seated Other Exercises Other Exercises: RUE digit ABD/ADD; level 1 theraputty with written HEP ( compostie digit flexion/ extension( needing assist from LUE to extend digits), pinceer grasp    General Comments General comments (skin integrity, edema, etc.): VSS; issued level 1 theraputty with written HEP      Pertinent Vitals/Pain Pain Assessment: Faces Faces Pain Scale: No hurt    Home Living  Prior Function            PT Goals (current goals can now be found in the care plan section) Acute Rehab PT Goals Patient Stated Goal: return home Progress towards PT goals: Progressing toward goals    Frequency    Min 4X/week      PT Plan Current plan remains appropriate    Co-evaluation PT/OT/SLP  Co-Evaluation/Treatment: Yes Reason for Co-Treatment: For patient/therapist safety;To address functional/ADL transfers PT goals addressed during session: Mobility/safety with mobility;Balance        AM-PAC PT "6 Clicks" Mobility   Outcome Measure  Help needed turning from your back to your side while in a flat bed without using bedrails?: None Help needed moving from lying on your back to sitting on the side of a flat bed without using bedrails?: A Little Help needed moving to and from a bed to a chair (including a wheelchair)?: A Little Help needed standing up from a chair using your arms (e.g., wheelchair or bedside chair)?: A Little Help needed to walk in hospital room?: A Lot Help needed climbing 3-5 steps with a railing? : A Lot 6 Click Score: 17    End of Session Equipment Utilized During Treatment: Gait belt Activity Tolerance: Patient tolerated treatment well Patient left: with call bell/phone within reach;in chair;with chair alarm set Nurse Communication: Mobility status PT Visit Diagnosis: History of falling (Z91.81);Muscle weakness (generalized) (M62.81);Difficulty in walking, not elsewhere classified (R26.2)     Time: [id number] PT Time Calculation (min) (ACUTE ONLY): 20 min  Charges:  $Gait Training: 8-22 mins                     Earney Navy, PTA Acute Rehabilitation Services Pager: [phone] Office: [phone]     Darliss Cheney 12/25/2019, 12:13 PM

## 2019-12-25 NOTE — Progress Notes (Signed)
PROGRESS NOTE    [name]  [id number] DOB: [birth year] DOA: 12/19/2019 PCP: [name], DO    Brief Narrative:  69 year old female with extensive medical issues including type 2 diabetes on insulin pump, myasthenia gravis, thyroid cancer status post resection, obstructive sleep apnea on CPAP at night, hypertension, hyperlipidemia, carotid stenosis who presents to the hospital with right face and right arm weakness and numbness as well as chest discomfort.  Initially evaluated in the ER for chest pain, VQ scan was negative.  Ruled out of acute coronary syndrome.  Also found to have right sided hemiparesis and acute stroke.   Assessment & Plan:   Principal Problem:   Thalamic stroke ([place]) Active Problems:   Myasthenia gravis ([place])   Essential hypertension, benign   DM (diabetes mellitus) type II uncontrolled with eye manifestation (HCC)   OSA (obstructive sleep apnea)   Cancer of thyroid (HCC)   Asymptomatic carotid artery stenosis, bilateral   Hypercholesterolemia  Chest pain: Atypical.  Acute coronary syndrome ruled out.  On aspirin Plavix now. She has palpable tenderness along the left costal margins and below breast line.  Improved with lidocaine patches.  Acute left thalamic stroke: Clinical findings, right facial sensory loss, right upper extremity motor and sensory loss. MRI of the brain, left thalamic stroke, acute ischemic stroke suspected small vessel disease. Carotid Doppler, known coronary artery disease, not contributing to current stroke. 2D echocardiogram, normal ejection fraction.  No source of emboli. Antiplatelet therapy, none at home.  Started on aspirin and Plavix for 3 weeks, then aspirin alone. LDL, 101.  Already on a statin.   Hemoglobin A1c, 9.8.  She has an insulin pump, has endocrinologist who is gradually working her up for insulin doses. DVT prophylaxis, Lovenox subcu Therapy recommendations, acute inpatient rehab  Hypertension: Acceptable.   Home medications resumed.  Hyperlipidemia: Continue Crestor and Zetia.  LDL 100.  Type 2 diabetes with hyperglycemia, uncontrolled: Using insulin pump at home.  Started on similar dose of long-acting insulin and prandial insulin.  Blood sugars acceptable now.  Myasthenia gravis: Patient on maintenance Rituxan injection, next is scheduled after a month.  She also has IVIG scheduled next week that she will reschedule.  Patient on prednisone and CellCept that she will continue.    DVT prophylaxis: Lovenox subcu Code Status: Full code Family Communication: None, family communicating. Disposition Plan: Status is: Inpatient  Remains inpatient appropriate because:Inpatient level of care appropriate due to severity of illness   Dispo: The patient is from: Home              Anticipated d/c is to: CIR              Anticipated d/c date is: 1 day              Patient currently is medically stable to d/c.          Consultants:   Neurology  Procedures:   None  Antimicrobials:   None   Subjective: No new events.  Lidocaine patch is helping with the chest pain.  Left knee is sore after working with therapies.  No other complaints.  Blood sugars acceptable.  Objective: Vitals:   12/24/19 2021 12/25/19 0002 12/25/19 0421 12/25/19 0723  BP: (!) 128/58 (!) 106/46 (!) 124/50 (!) 132/59  Pulse: 88 88 84 85  Resp: 18 17 19 18   Temp: 98 F (36.7 C) 98 F (36.7 C) 98.2 F (36.8 C) 97.8 F (36.6 C)  TempSrc: Oral Oral Oral  Oral  SpO2: 95% 97% 96% 98%  Weight:      Height:        Intake/Output Summary (Last 24 hours) at 12/25/2019 1050 Last data filed at 12/25/2019 0900 Gross per 24 hour  Intake 800 ml  Output --  Net 800 ml   Filed Weights   12/19/19 1034  Weight: 81.6 kg    Examination:  General exam: Appears calm and comfortable, on room air. Respiratory system: Clear to auscultation. Respiratory effort normal. Cardiovascular system: S1 & S2 heard, RRR. No JVD,  murmurs, rubs, gallops or clicks Right chest wall Port-A-Cath present. Gastrointestinal system: Abdomen is nondistended, soft and nontender. No organomegaly or masses felt. Normal bowel sounds heard. Central nervous system: Alert and oriented. No focal neurological deficits. Extremities: Superficial sensation loss on the right side of the face. Right upper extremity and lower extremity 4/5.   Skin: No rashes, lesions or ulcers Psychiatry: Judgement and insight appear normal. Mood & affect appropriate.     Data Reviewed: I have personally reviewed following labs and imaging studies  CBC: Recent Labs  Lab 12/19/19 1141  WBC 6.7  HGB 11.8*  HCT 36.2  MCV 94.3  PLT 0000000   Basic Metabolic Panel: Recent Labs  Lab 12/19/19 1141 12/23/19 1300  NA 140  --   K 3.3*  --   CL 106  --   CO2 24  --   GLUCOSE 124* 467*  BUN 20  --   CREATININE 0.69  --   CALCIUM 10.1  --    GFR: Estimated Creatinine Clearance: 70 mL/min (by C-G formula based on SCr of 0.69 mg/dL). Liver Function Tests: Recent Labs  Lab 12/19/19 1141  AST 22  ALT 24  ALKPHOS 98  BILITOT 0.5  PROT 7.8  ALBUMIN 3.8   No results for input(s): LIPASE, AMYLASE in the last 168 hours. No results for input(s): AMMONIA in the last 168 hours. Coagulation Profile: No results for input(s): INR, PROTIME in the last 168 hours. Cardiac Enzymes: No results for input(s): CKTOTAL, CKMB, CKMBINDEX, TROPONINI in the last 168 hours. BNP (last 3 results) No results for input(s): PROBNP in the last 8760 hours. HbA1C: No results for input(s): HGBA1C in the last 72 hours. CBG: Recent Labs  Lab 12/24/19 0608 12/24/19 1214 12/24/19 1648 12/24/19 2116 12/25/19 0619  GLUCAP 195* 279* 241* 134* 159*   Lipid Profile: No results for input(s): CHOL, HDL, LDLCALC, TRIG, CHOLHDL, LDLDIRECT in the last 72 hours. Thyroid Function Tests: No results for input(s): TSH, T4TOTAL, FREET4, T3FREE, THYROIDAB in the last 72 hours. Anemia  Panel: No results for input(s): VITAMINB12, FOLATE, FERRITIN, TIBC, IRON, RETICCTPCT in the last 72 hours. Sepsis Labs: No results for input(s): PROCALCITON, LATICACIDVEN in the last 168 hours.  Recent Results (from the past 240 hour(s))  Respiratory Panel by RT PCR (Flu A&B, Covid) - Nasopharyngeal Swab     Status: None   Collection Time: 12/20/19  3:00 PM   Specimen: Nasopharyngeal Swab  Result Value Ref Range Status   SARS Coronavirus 2 by RT PCR NEGATIVE NEGATIVE Final    Comment: (NOTE) SARS-CoV-2 target nucleic acids are NOT DETECTED. The SARS-CoV-2 RNA is generally detectable in upper respiratoy specimens during the acute phase of infection. The lowest concentration of SARS-CoV-2 viral copies this assay can detect is 131 copies/mL. A negative result does not preclude SARS-Cov-2 infection and should not be used as the sole basis for treatment or other patient management decisions. A negative result may  occur with  improper specimen collection/handling, submission of specimen other than nasopharyngeal swab, presence of viral mutation(s) within the areas targeted by this assay, and inadequate number of viral copies (<131 copies/mL). A negative result must be combined with clinical observations, patient history, and epidemiological information. The expected result is Negative. Fact Sheet for Patients:  PinkCheek.be Fact Sheet for Healthcare Providers:  GravelBags.it This test is not yet ap proved or cleared by the [place] FDA and  has been authorized for detection and/or diagnosis of SARS-CoV-2 by FDA under an Emergency Use Authorization (EUA). This EUA will remain  in effect (meaning this test can be used) for the duration of the COVID-19 declaration under Section 564(b)(1) of the Act, 21 U.S.C. section 360bbb-3(b)(1), unless the authorization is terminated or revoked sooner.    Influenza A by PCR NEGATIVE  NEGATIVE Final   Influenza B by PCR NEGATIVE NEGATIVE Final    Comment: (NOTE) The Xpert Xpress SARS-CoV-2/FLU/RSV assay is intended as an aid in  the diagnosis of influenza from Nasopharyngeal swab specimens and  should not be used as a sole basis for treatment. Nasal washings and  aspirates are unacceptable for Xpert Xpress SARS-CoV-2/FLU/RSV  testing. Fact Sheet for Patients: PinkCheek.be Fact Sheet for Healthcare Providers: GravelBags.it This test is not yet approved or cleared by the [place] FDA and  has been authorized for detection and/or diagnosis of SARS-CoV-2 by  FDA under an Emergency Use Authorization (EUA). This EUA will remain  in effect (meaning this test can be used) for the duration of the  Covid-19 declaration under Section 564(b)(1) of the Act, 21  U.S.C. section 360bbb-3(b)(1), unless the authorization is  terminated or revoked. Performed at [facility], [place] [address], [place], [place] [address]          Radiology Studies: No results found.      Scheduled Meds: . aspirin EC  81 mg Oral Daily  . Chlorhexidine Gluconate Cloth  6 each Topical Daily  . clopidogrel  75 mg Oral Daily  . cycloSPORINE  1 drop Both Eyes BID  . enoxaparin (LOVENOX) injection  40 mg Subcutaneous Q24H  . ezetimibe  10 mg Oral Daily  . insulin aspart  0-15 Units Subcutaneous TID WC  . insulin aspart  0-5 Units Subcutaneous QHS  . insulin aspart  12 Units Subcutaneous TID WC  . insulin detemir  35 Units Subcutaneous Daily  . irbesartan  150 mg Oral Daily  . levothyroxine  137 mcg Oral QAC breakfast  . lidocaine  1 patch Transdermal Q24H  . montelukast  10 mg Oral BH-q7a  . mycophenolate  1,500 mg Oral BID  . pantoprazole  40 mg Oral Daily  . predniSONE  10 mg Oral BH-q7a  . rosuvastatin  40 mg Oral QHS  . sodium chloride flush  10-40 mL Intracatheter Q12H  . spironolactone  25 mg Oral Daily    Continuous Infusions:   LOS: 5 days    Time spent: 30 minutes    Barb Merino, MD [facility] Pager [phone]

## 2019-12-25 NOTE — Progress Notes (Signed)
Inpatient Rehabilitation-Admissions Coordinator   No word from insurance regarding pt's request for CIR. AC will follow up once there has been a determination.   Raechel Ache, OTR/L  Rehab Admissions Coordinator  [phone] 12/25/2019 12:17 PM

## 2019-12-25 NOTE — Progress Notes (Addendum)
Inpatient Diabetes Program Recommendations  AACE/ADA: New Consensus Statement on Inpatient Glycemic Control (2015)  Target Ranges:  Prepandial:   less than 140 mg/dL      Peak postprandial:   less than 180 mg/dL (1-2 hours)      Critically ill patients:  140 - 180 mg/dL   Lab Results  Component Value Date   GLUCAP 159 (H) 12/25/2019   HGBA1C 9.1 (H) 12/20/2019    Review of Glycemic Control Results for [name], [name] (MRN [id number]) as of 12/25/2019 11:03  Ref. Range 12/24/2019 12:14 12/24/2019 16:48 12/24/2019 21:16 12/25/2019 06:19  Glucose-Capillary Latest Ref Range: 70 - 99 mg/dL 279 (H) 241 (H) 134 (H) 159 (H)   Diabetes history: DM Outpatient Diabetes medications: Omnipod insulin pump Current orders for Inpatient glycemic control:  Levemir 35 units QD,  Novolog 12 units TID Novolog 0-15 units tid + hs PO prednisone 10 mg Daily  A1c 9.1% on 12/20/19  Inpatient Diabetes Program Recommendations:     If post prandial glucose trends continue to exceed 180 mg/dL in the setting of steroids, consider increasing Novolog meal coverage 15 units tid if eating >50% of meals.  Thanks, Bronson Curb, MSN, RNC-OB Diabetes Coordinator [phone] (8a-5p)

## 2019-12-26 LAB — GLUCOSE, CAPILLARY
Glucose-Capillary: 201 mg/dL — ABNORMAL HIGH (ref 70–99)
Glucose-Capillary: 146 mg/dL — ABNORMAL HIGH (ref 70–99)
Glucose-Capillary: 301 mg/dL — ABNORMAL HIGH (ref 70–99)
Glucose-Capillary: 239 mg/dL — ABNORMAL HIGH (ref 70–99)

## 2019-12-26 MED ORDER — INSULIN ASPART 100 UNIT/ML ~~LOC~~ SOLN
15.0000 [IU] | Freq: Three times a day (TID) | SUBCUTANEOUS | Status: DC
  Administered 2019-12-26 – 2019-12-27 (×6): 15 [IU] via SUBCUTANEOUS

## 2019-12-26 NOTE — Progress Notes (Signed)
Occupational Therapy Treatment Patient Details Name: [name] MRN: [id number] DOB: [birth year] Today's Date: 12/26/2019    History of present illness Pt is a 69 yo female presenting with c/o chest pain and R-sided face and arm weakness/numbness and tingling. Upon admission, imaging revealed acute L thalamic CVA. PMH includes: Myasthenia gravis, HTN, DM II, thyroid cancer, HCL, and OSA.   OT comments  Patient continues to make steady progress towards goals in skilled OT session. Patient's session encompassed education and completion of therapeutic exercises for RUE. Pt continues to report tingling in RUE, but has feeling in arm and forearm. Pt able to verbalize exercises provided by therapy earlier in the week, and provided exercises to promote shoulder extension in order to promote independence in bathing back. Pt continues to make an excellent candidate for CIR to address functional deficits; will continue to follow acutely.    Follow Up Recommendations  CIR;Supervision/Assistance - 24 hour    Equipment Recommendations  3 in 1 bedside commode    Recommendations for Other Services      Precautions / Restrictions Precautions Precautions: Fall Precaution Comments: pt reports no recent falls, "knees give out" about 2x/month Restrictions Weight Bearing Restrictions: No       Mobility Bed Mobility               General bed mobility comments: pt OOB in chair upon arrival  Transfers Overall transfer level: Needs assistance Equipment used: None Transfers: Sit to/from Stand Sit to Stand: Min assist         General transfer comment: assist once in standing for balance; pt able to power up with min guard for safety and cues to use R hand     Balance Overall balance assessment: Needs assistance Sitting-balance support: Feet supported Sitting balance-Leahy Scale: Good     Standing balance support: During functional activity;Single extremity supported;Bilateral upper  extremity supported Standing balance-Leahy Scale: Poor                             ADL either performed or assessed with clinical judgement   ADL                                               Vision       Perception     Praxis      Cognition Arousal/Alertness: Awake/alert Behavior During Therapy: WFL for tasks assessed/performed Overall Cognitive Status: Within Functional Limits for tasks assessed                                 General Comments: Pt pleasant and willing to participate in therapy tasks. Pt able to follow multi-step instructions without difficulty.         Exercises Hand Exercises Forearm Supination: AROM;Right;Seated;5 reps Forearm Pronation: AROM;Right;5 reps;Seated Opposition: AROM;Right;5 reps;Seated Other Exercises Other Exercises: Education provided to place towel/washcloth on table to promote shoulder extension and then reiteration of digit exercises to promote movement   Shoulder Instructions       General Comments      Pertinent Vitals/ Pain       Pain Assessment: Faces Faces Pain Scale: Hurts little more Pain Location: R shoulder Pain Descriptors / Indicators: Tightness;Tingling;Discomfort Pain Intervention(s): Limited activity within patient's tolerance;Monitored during  session;Repositioned  Home Living   Living Arrangements: Alone                                      Prior Functioning/Environment              Frequency  Min 3X/week        Progress Toward Goals  OT Goals(current goals can now be found in the care plan section)  Progress towards OT goals: Progressing toward goals  Acute Rehab OT Goals Patient Stated Goal: return home Time For Goal Achievement: 01/04/20 Potential to Achieve Goals: Good  Plan Discharge plan remains appropriate    Co-evaluation                 AM-PAC OT "6 Clicks" Daily Activity     Outcome Measure   Help from  another person eating meals?: None Help from another person taking care of personal grooming?: A Little Help from another person toileting, which includes using toliet, bedpan, or urinal?: A Little Help from another person bathing (including washing, rinsing, drying)?: A Little Help from another person to put on and taking off regular upper body clothing?: A Little Help from another person to put on and taking off regular lower body clothing?: A Little 6 Click Score: 19    End of Session    OT Visit Diagnosis: Unsteadiness on feet (R26.81);Muscle weakness (generalized) (M62.81);Hemiplegia and hemiparesis Hemiplegia - Right/Left: Right Hemiplegia - dominant/non-dominant: Dominant Hemiplegia - caused by: Cerebral infarction   Activity Tolerance Patient tolerated treatment well   Patient Left in chair;with call bell/phone within reach;with chair alarm set   Nurse Communication Mobility status        Time: [id number] OT Time Calculation (min): 16 min  Charges: OT General Charges $OT Visit: 1 Visit OT Treatments $Therapeutic Exercise: 8-22 mins  Corinne Ports E. Johnella Crumm, COTA/L Acute Rehabilitation Services [place] 12/26/2019, 2:47 PM

## 2019-12-26 NOTE — Progress Notes (Signed)
Physical Therapy Treatment Patient Details Name: [name] MRN: [id number] DOB: [birth year] Today's Date: 12/26/2019    History of Present Illness Pt is a 69 yo female presenting with c/o chest pain and R-sided face and arm weakness/numbness and tingling. Upon admission, imaging revealed acute L thalamic CVA. PMH includes: Myasthenia gravis, HTN, DM II, thyroid cancer, HCL, and OSA.    PT Comments    Patient is making progress toward PT goals. Pt continues to present with R side weakness and requires mod A for gait training. Continue to recommend CIR for further skilled PT services to maximize independence and safety with mobility.     Follow Up Recommendations  CIR;Supervision/Assistance - 24 hour     Equipment Recommendations  Rolling walker with 5" wheels;3in1 (PT)    Recommendations for Other Services       Precautions / Restrictions Precautions Precautions: Fall Restrictions Weight Bearing Restrictions: No    Mobility  Bed Mobility               General bed mobility comments: pt OOB in chair upon arrival  Transfers Overall transfer level: Needs assistance Equipment used: None Transfers: Sit to/from Stand Sit to Stand: Min assist         General transfer comment: assist once in standing for balance; pt able to power up with min guard for safety and cues to use R hand   Ambulation/Gait Ambulation/Gait assistance: Mod assist Gait Distance (Feet): (100 ft with 2 standing rest breaks) Assistive device: 1 person hand held assist(and assist at trunk with gait belt) Gait Pattern/deviations: Decreased step length - left;Decreased stance time - right;Decreased dorsiflexion - right;Decreased weight shift to right;Step-to pattern;Step-through pattern Gait velocity: decreased    General Gait Details: assist for weight shifting and balance; cues for sequencing; decreased R step clearance; LOB posteriorly    Stairs             Wheelchair Mobility     Modified Rankin (Stroke Patients Only) Modified Rankin (Stroke Patients Only) Pre-Morbid Rankin Score: Slight disability Modified Rankin: Moderately severe disability     Balance Overall balance assessment: Needs assistance Sitting-balance support: Feet supported Sitting balance-Leahy Scale: Good     Standing balance support: During functional activity;Single extremity supported;Bilateral upper extremity supported Standing balance-Leahy Scale: Poor                              Cognition Arousal/Alertness: Awake/alert Behavior During Therapy: WFL for tasks assessed/performed Overall Cognitive Status: Within Functional Limits for tasks assessed                                        Exercises      General Comments        Pertinent Vitals/Pain Pain Assessment: No/denies pain    Home Living   Living Arrangements: Alone                  Prior Function            PT Goals (current goals can now be found in the care plan section) Acute Rehab PT Goals Patient Stated Goal: return home Progress towards PT goals: Progressing toward goals    Frequency    Min 4X/week      PT Plan Current plan remains appropriate    Co-evaluation  AM-PAC PT "6 Clicks" Mobility   Outcome Measure  Help needed turning from your back to your side while in a flat bed without using bedrails?: None Help needed moving from lying on your back to sitting on the side of a flat bed without using bedrails?: A Little Help needed moving to and from a bed to a chair (including a wheelchair)?: A Little Help needed standing up from a chair using your arms (e.g., wheelchair or bedside chair)?: A Little Help needed to walk in hospital room?: A Lot Help needed climbing 3-5 steps with a railing? : A Lot 6 Click Score: 17    End of Session Equipment Utilized During Treatment: Gait belt Activity Tolerance: Patient tolerated treatment  well Patient left: with call bell/phone within reach;in chair;with chair alarm set Nurse Communication: Mobility status PT Visit Diagnosis: History of falling (Z91.81);Muscle weakness (generalized) (M62.81);Difficulty in walking, not elsewhere classified (R26.2)     Time: [id number] PT Time Calculation (min) (ACUTE ONLY): 17 min  Charges:  $Gait Training: 8-22 mins                     Earney Navy, PTA Acute Rehabilitation Services Pager: [phone] Office: [phone]     Darliss Cheney 12/26/2019, 1:36 PM

## 2019-12-26 NOTE — Progress Notes (Signed)
PROGRESS NOTE    [name]  [id number] DOB: September 27, 1950 DOA: 12/19/2019 PCP: [name], DO    Brief Narrative:  69 year old female with extensive medical issues including type 2 diabetes on insulin pump, myasthenia gravis, thyroid cancer status post resection, obstructive sleep apnea on CPAP at night, hypertension, hyperlipidemia, carotid stenosis who presents to the hospital with right face and right arm weakness and numbness as well as chest discomfort.  Initially evaluated in the ER for chest pain, VQ scan was negative.  Ruled out of acute coronary syndrome.  Also found to have right sided hemiparesis and acute stroke.   Assessment & Plan:   Principal Problem:   Thalamic stroke ([place]) Active Problems:   Myasthenia gravis ([place])   Essential hypertension, benign   DM (diabetes mellitus) type II uncontrolled with eye manifestation (HCC)   OSA (obstructive sleep apnea)   Cancer of thyroid (HCC)   Asymptomatic carotid artery stenosis, bilateral   Hypercholesterolemia  Chest pain: Atypical.  Acute coronary syndrome ruled out.  On aspirin Plavix now. She had palpable tenderness along the left costal margins and below breast line.  Improved with lidocaine patches.  Acute left thalamic stroke: Clinical findings, right facial sensory loss, right upper extremity motor and sensory loss. MRI of the brain, left thalamic stroke, acute ischemic stroke suspected small vessel disease. Carotid Doppler, known coronary artery disease, not contributing to current stroke. 2D echocardiogram, normal ejection fraction.  No source of emboli. Antiplatelet therapy, none at home.  Started on aspirin and Plavix for 3 weeks, then aspirin alone. LDL, 101.  Already on a statin.   Hemoglobin A1c, 9.8.  She has an insulin pump, has endocrinologist who is gradually working her up for insulin doses. DVT prophylaxis, Lovenox subcu Therapy recommendations, acute inpatient rehab  Hypertension: Acceptable.   Home medications resumed.  Hyperlipidemia: Continue Crestor and Zetia.  LDL 100.  Type 2 diabetes with hyperglycemia, uncontrolled: Using insulin pump at home.  Started on similar dose of long-acting insulin and prandial insulin.  Blood sugars slightly up. Changed prandial insulin.   Myasthenia gravis: Patient on maintenance Rituxan injection, next is scheduled after a month.  She also has IVIG scheduled next week that she will reschedule.  Patient on prednisone and CellCept that she will continue.  Obstructive sleep apnea: Not wearing CPAP from hospital.  Has brought her own CPAP.  Will allow her to use her own.  DVT prophylaxis: Lovenox subcu Code Status: Full code Family Communication: None, family communicating. Disposition Plan: Status is: Inpatient  Remains inpatient appropriate because:Inpatient level of care appropriate due to severity of illness   Dispo: The patient is from: Home              Anticipated d/c is to: CIR              Anticipated d/c date is: 1 day              Patient currently is medically stable to d/c.          Consultants:   Neurology  Procedures:   None  Antimicrobials:   None   Subjective: No new events.  She feels somehow tired probably because of not using CPAP. No more chest pain.  Knee pain improved.  Objective: Vitals:   12/25/19 1950 12/25/19 2312 12/26/19 0319 12/26/19 0830  BP: (!) 109/50 (!) 109/44 (!) 119/43 (!) 128/52  Pulse: 89 86 84 87  Resp: 18 17 17 18   Temp: 98 F (36.7  C) 98.1 F (36.7 C) 98.2 F (36.8 C) 98 F (36.7 C)  TempSrc: Oral Oral Oral Oral  SpO2: 97% 96% 99% 97%  Weight:      Height:        Intake/Output Summary (Last 24 hours) at 12/26/2019 1137 Last data filed at 12/26/2019 0919 Gross per 24 hour  Intake 1230 ml  Output --  Net 1230 ml   Filed Weights   12/19/19 1034  Weight: 81.6 kg    Examination:  General exam: Appears calm and comfortable, on room air. Respiratory system: Clear to  auscultation. Respiratory effort normal. Cardiovascular system: S1 & S2 heard, RRR. No JVD, murmurs, rubs, gallops or clicks Right chest wall Port-A-Cath present. Gastrointestinal system: Abdomen is nondistended, soft and nontender. No organomegaly or masses felt. Normal bowel sounds heard. Central nervous system: Alert and oriented. No focal neurological deficits. Extremities: Superficial sensation loss on the right side of the face. Right upper extremity and lower extremity 4/5.   Skin: No rashes, lesions or ulcers Psychiatry: Judgement and insight appear normal. Mood & affect appropriate.     Data Reviewed: I have personally reviewed following labs and imaging studies  CBC: Recent Labs  Lab 12/19/19 1141  WBC 6.7  HGB 11.8*  HCT 36.2  MCV 94.3  PLT 0000000   Basic Metabolic Panel: Recent Labs  Lab 12/19/19 1141 12/23/19 1300  NA 140  --   K 3.3*  --   CL 106  --   CO2 24  --   GLUCOSE 124* 467*  BUN 20  --   CREATININE 0.69  --   CALCIUM 10.1  --    GFR: Estimated Creatinine Clearance: 70 mL/min (by C-G formula based on SCr of 0.69 mg/dL). Liver Function Tests: Recent Labs  Lab 12/19/19 1141  AST 22  ALT 24  ALKPHOS 98  BILITOT 0.5  PROT 7.8  ALBUMIN 3.8   No results for input(s): LIPASE, AMYLASE in the last 168 hours. No results for input(s): AMMONIA in the last 168 hours. Coagulation Profile: No results for input(s): INR, PROTIME in the last 168 hours. Cardiac Enzymes: No results for input(s): CKTOTAL, CKMB, CKMBINDEX, TROPONINI in the last 168 hours. BNP (last 3 results) No results for input(s): PROBNP in the last 8760 hours. HbA1C: No results for input(s): HGBA1C in the last 72 hours. CBG: Recent Labs  Lab 12/25/19 0619 12/25/19 1137 12/25/19 1626 12/25/19 2113 12/26/19 0615  GLUCAP 159* 294* 228* 255* 201*   Lipid Profile: No results for input(s): CHOL, HDL, LDLCALC, TRIG, CHOLHDL, LDLDIRECT in the last 72 hours. Thyroid Function Tests: No  results for input(s): TSH, T4TOTAL, FREET4, T3FREE, THYROIDAB in the last 72 hours. Anemia Panel: No results for input(s): VITAMINB12, FOLATE, FERRITIN, TIBC, IRON, RETICCTPCT in the last 72 hours. Sepsis Labs: No results for input(s): PROCALCITON, LATICACIDVEN in the last 168 hours.  Recent Results (from the past 240 hour(s))  Respiratory Panel by RT PCR (Flu A&B, Covid) - Nasopharyngeal Swab     Status: None   Collection Time: 12/20/19  3:00 PM   Specimen: Nasopharyngeal Swab  Result Value Ref Range Status   SARS Coronavirus 2 by RT PCR NEGATIVE NEGATIVE Final    Comment: (NOTE) SARS-CoV-2 target nucleic acids are NOT DETECTED. The SARS-CoV-2 RNA is generally detectable in upper respiratoy specimens during the acute phase of infection. The lowest concentration of SARS-CoV-2 viral copies this assay can detect is 131 copies/mL. A negative result does not preclude SARS-Cov-2 infection and should  not be used as the sole basis for treatment or other patient management decisions. A negative result may occur with  improper specimen collection/handling, submission of specimen other than nasopharyngeal swab, presence of viral mutation(s) within the areas targeted by this assay, and inadequate number of viral copies (<131 copies/mL). A negative result must be combined with clinical observations, patient history, and epidemiological information. The expected result is Negative. Fact Sheet for Patients:  PinkCheek.be Fact Sheet for Healthcare Providers:  GravelBags.it This test is not yet ap proved or cleared by the [place] FDA and  has been authorized for detection and/or diagnosis of SARS-CoV-2 by FDA under an Emergency Use Authorization (EUA). This EUA will remain  in effect (meaning this test can be used) for the duration of the COVID-19 declaration under Section 564(b)(1) of the Act, 21 U.S.C. section 360bbb-3(b)(1), unless  the authorization is terminated or revoked sooner.    Influenza A by PCR NEGATIVE NEGATIVE Final   Influenza B by PCR NEGATIVE NEGATIVE Final    Comment: (NOTE) The Xpert Xpress SARS-CoV-2/FLU/RSV assay is intended as an aid in  the diagnosis of influenza from Nasopharyngeal swab specimens and  should not be used as a sole basis for treatment. Nasal washings and  aspirates are unacceptable for Xpert Xpress SARS-CoV-2/FLU/RSV  testing. Fact Sheet for Patients: PinkCheek.be Fact Sheet for Healthcare Providers: GravelBags.it This test is not yet approved or cleared by the [place] FDA and  has been authorized for detection and/or diagnosis of SARS-CoV-2 by  FDA under an Emergency Use Authorization (EUA). This EUA will remain  in effect (meaning this test can be used) for the duration of the  Covid-19 declaration under Section 564(b)(1) of the Act, 21  U.S.C. section 360bbb-3(b)(1), unless the authorization is  terminated or revoked. Performed at [facility], [place] [address], [place], [place] [address]          Radiology Studies: No results found.      Scheduled Meds: . aspirin EC  81 mg Oral Daily  . Chlorhexidine Gluconate Cloth  6 each Topical Daily  . clopidogrel  75 mg Oral Daily  . cycloSPORINE  1 drop Both Eyes BID  . diclofenac Sodium  2 g Topical QID  . enoxaparin (LOVENOX) injection  40 mg Subcutaneous Q24H  . ezetimibe  10 mg Oral Daily  . insulin aspart  0-15 Units Subcutaneous TID WC  . insulin aspart  0-5 Units Subcutaneous QHS  . insulin aspart  15 Units Subcutaneous TID WC  . insulin detemir  35 Units Subcutaneous Daily  . irbesartan  150 mg Oral Daily  . levothyroxine  137 mcg Oral QAC breakfast  . lidocaine  1 patch Transdermal Q24H  . montelukast  10 mg Oral BH-q7a  . mycophenolate  1,500 mg Oral BID  . pantoprazole  40 mg Oral Daily  . predniSONE  10 mg Oral BH-q7a  .  rosuvastatin  40 mg Oral QHS  . sodium chloride flush  10-40 mL Intracatheter Q12H  . spironolactone  25 mg Oral Daily   Continuous Infusions:   LOS: 6 days    Time spent: 25 minutes    Barb Merino, MD [facility] Pager [phone]

## 2019-12-26 NOTE — Progress Notes (Signed)
Inpatient Rehabilitation-Admissions Coordinator   I have received insurance approval for admit to CIR. However, I do not have a bed open today for this patient. Will continue to follow up for possible admit, pending bed availability.   Raechel Ache, OTR/L  Rehab Admissions Coordinator  [phone] 12/26/2019 1:36 PM

## 2019-12-27 ENCOUNTER — Inpatient Hospital Stay (HOSPITAL_COMMUNITY)
Admission: RE | Admit: 2019-12-27 | Discharge: 2020-01-09 | DRG: 057 | Disposition: A | Discharge status: Home Health | Payer: Medicare Other | Source: Intra-hospital | Attending: Physical Medicine & Rehabilitation | Admitting: Physical Medicine & Rehabilitation

## 2019-12-27 DIAGNOSIS — Z981 Arthrodesis status: Secondary | ICD-10-CM

## 2019-12-27 DIAGNOSIS — E1151 Type 2 diabetes mellitus with diabetic peripheral angiopathy without gangrene: Secondary | ICD-10-CM | POA: Diagnosis present

## 2019-12-27 DIAGNOSIS — E1165 Type 2 diabetes mellitus with hyperglycemia: Secondary | ICD-10-CM | POA: Diagnosis not present

## 2019-12-27 DIAGNOSIS — I639 Cerebral infarction, unspecified: Secondary | ICD-10-CM | POA: Diagnosis present

## 2019-12-27 DIAGNOSIS — M6281 Muscle weakness (generalized): Secondary | ICD-10-CM | POA: Diagnosis not present

## 2019-12-27 DIAGNOSIS — Z823 Family history of stroke: Secondary | ICD-10-CM | POA: Diagnosis not present

## 2019-12-27 DIAGNOSIS — I6523 Occlusion and stenosis of bilateral carotid arteries: Secondary | ICD-10-CM | POA: Diagnosis not present

## 2019-12-27 DIAGNOSIS — I10 Essential (primary) hypertension: Secondary | ICD-10-CM | POA: Diagnosis present

## 2019-12-27 DIAGNOSIS — Z85850 Personal history of malignant neoplasm of thyroid: Secondary | ICD-10-CM

## 2019-12-27 DIAGNOSIS — Z7901 Long term (current) use of anticoagulants: Secondary | ICD-10-CM | POA: Diagnosis not present

## 2019-12-27 DIAGNOSIS — Z7952 Long term (current) use of systemic steroids: Secondary | ICD-10-CM | POA: Diagnosis not present

## 2019-12-27 DIAGNOSIS — Z888 Allergy status to other drugs, medicaments and biological substances status: Secondary | ICD-10-CM

## 2019-12-27 DIAGNOSIS — M1990 Unspecified osteoarthritis, unspecified site: Secondary | ICD-10-CM | POA: Diagnosis present

## 2019-12-27 DIAGNOSIS — Z833 Family history of diabetes mellitus: Secondary | ICD-10-CM | POA: Diagnosis not present

## 2019-12-27 DIAGNOSIS — G4733 Obstructive sleep apnea (adult) (pediatric): Secondary | ICD-10-CM | POA: Diagnosis not present

## 2019-12-27 DIAGNOSIS — E890 Postprocedural hypothyroidism: Secondary | ICD-10-CM | POA: Diagnosis not present

## 2019-12-27 DIAGNOSIS — I69351 Hemiplegia and hemiparesis following cerebral infarction affecting right dominant side: Secondary | ICD-10-CM | POA: Diagnosis not present

## 2019-12-27 DIAGNOSIS — E785 Hyperlipidemia, unspecified: Secondary | ICD-10-CM | POA: Diagnosis present

## 2019-12-27 DIAGNOSIS — Z91041 Radiographic dye allergy status: Secondary | ICD-10-CM

## 2019-12-27 DIAGNOSIS — Z7951 Long term (current) use of inhaled steroids: Secondary | ICD-10-CM

## 2019-12-27 DIAGNOSIS — J45909 Unspecified asthma, uncomplicated: Secondary | ICD-10-CM

## 2019-12-27 DIAGNOSIS — G7000 Myasthenia gravis without (acute) exacerbation: Secondary | ICD-10-CM

## 2019-12-27 DIAGNOSIS — E7800 Pure hypercholesterolemia, unspecified: Secondary | ICD-10-CM | POA: Diagnosis present

## 2019-12-27 DIAGNOSIS — Z79899 Other long term (current) drug therapy: Secondary | ICD-10-CM

## 2019-12-27 DIAGNOSIS — G894 Chronic pain syndrome: Secondary | ICD-10-CM | POA: Diagnosis not present

## 2019-12-27 DIAGNOSIS — Z8249 Family history of ischemic heart disease and other diseases of the circulatory system: Secondary | ICD-10-CM | POA: Diagnosis not present

## 2019-12-27 DIAGNOSIS — Z79890 Hormone replacement therapy (postmenopausal): Secondary | ICD-10-CM

## 2019-12-27 DIAGNOSIS — Z885 Allergy status to narcotic agent status: Secondary | ICD-10-CM

## 2019-12-27 DIAGNOSIS — K219 Gastro-esophageal reflux disease without esophagitis: Secondary | ICD-10-CM | POA: Diagnosis not present

## 2019-12-27 DIAGNOSIS — Z7982 Long term (current) use of aspirin: Secondary | ICD-10-CM

## 2019-12-27 DIAGNOSIS — Z801 Family history of malignant neoplasm of trachea, bronchus and lung: Secondary | ICD-10-CM

## 2019-12-27 DIAGNOSIS — Z91013 Allergy to seafood: Secondary | ICD-10-CM

## 2019-12-27 DIAGNOSIS — I6381 Other cerebral infarction due to occlusion or stenosis of small artery: Secondary | ICD-10-CM | POA: Diagnosis present

## 2019-12-27 DIAGNOSIS — Z825 Family history of asthma and other chronic lower respiratory diseases: Secondary | ICD-10-CM

## 2019-12-27 DIAGNOSIS — E1139 Type 2 diabetes mellitus with other diabetic ophthalmic complication: Secondary | ICD-10-CM

## 2019-12-27 LAB — GLUCOSE, CAPILLARY
Glucose-Capillary: 145 mg/dL — ABNORMAL HIGH (ref 70–99)
Glucose-Capillary: 303 mg/dL — ABNORMAL HIGH (ref 70–99)
Glucose-Capillary: 220 mg/dL — ABNORMAL HIGH (ref 70–99)
Glucose-Capillary: 146 mg/dL — ABNORMAL HIGH (ref 70–99)

## 2019-12-27 MED ORDER — INSULIN ASPART 100 UNIT/ML ~~LOC~~ SOLN
15.0000 [IU] | Freq: Three times a day (TID) | SUBCUTANEOUS | Status: DC
  Administered 2019-12-28 – 2019-12-30 (×7): 15 [IU] via SUBCUTANEOUS

## 2019-12-27 MED ORDER — SPIRONOLACTONE 25 MG PO TABS
25.0000 mg | ORAL_TABLET | Freq: Every day | ORAL | Status: DC
  Administered 2019-12-28 – 2020-01-09 (×13): 25 mg via ORAL
  Filled 2019-12-27 (×13): qty 1

## 2019-12-27 MED ORDER — PREDNISONE 5 MG PO TABS
10.0000 mg | ORAL_TABLET | ORAL | Status: DC
  Administered 2019-12-28 – 2020-01-09 (×13): 10 mg via ORAL
  Filled 2019-12-27 (×15): qty 2

## 2019-12-27 MED ORDER — ASPIRIN EC 81 MG PO TBEC
81.0000 mg | DELAYED_RELEASE_TABLET | Freq: Every day | ORAL | Status: DC
  Administered 2019-12-28 – 2020-01-09 (×13): 81 mg via ORAL
  Filled 2019-12-27 (×13): qty 1

## 2019-12-27 MED ORDER — ENOXAPARIN SODIUM 40 MG/0.4ML ~~LOC~~ SOLN
40.0000 mg | SUBCUTANEOUS | Status: DC
  Administered 2019-12-28 – 2020-01-05 (×9): 40 mg via SUBCUTANEOUS
  Filled 2019-12-27 (×9): qty 0.4

## 2019-12-27 MED ORDER — ASPIRIN 81 MG PO TBEC: 81.0000 mg | DELAYED_RELEASE_TABLET | Freq: Every day | ORAL | Status: DC

## 2019-12-27 MED ORDER — CLOPIDOGREL BISULFATE 75 MG PO TABS: 75.0000 mg | ORAL_TABLET | Freq: Every day | ORAL | 0 refills | Status: DC

## 2019-12-27 MED ORDER — PANTOPRAZOLE SODIUM 40 MG PO TBEC
40.0000 mg | DELAYED_RELEASE_TABLET | Freq: Every day | ORAL | Status: DC
  Administered 2019-12-28 – 2020-01-09 (×13): 40 mg via ORAL
  Filled 2019-12-27 (×13): qty 1

## 2019-12-27 MED ORDER — ALBUTEROL SULFATE (2.5 MG/3ML) 0.083% IN NEBU: 2.5000 mg | INHALATION_SOLUTION | RESPIRATORY_TRACT | Status: DC | PRN

## 2019-12-27 MED ORDER — INSULIN DETEMIR 100 UNIT/ML ~~LOC~~ SOLN: 40.0000 [IU] | Freq: Every day | SUBCUTANEOUS | 11 refills | Status: DC

## 2019-12-27 MED ORDER — MONTELUKAST SODIUM 10 MG PO TABS
10.0000 mg | ORAL_TABLET | ORAL | Status: DC
  Administered 2019-12-28 – 2020-01-09 (×13): 10 mg via ORAL
  Filled 2019-12-27 (×13): qty 1

## 2019-12-27 MED ORDER — DICLOFENAC SODIUM 1 % EX GEL
2.0000 g | Freq: Four times a day (QID) | CUTANEOUS | Status: DC
  Administered 2019-12-27 – 2020-01-08 (×28): 2 g via TOPICAL
  Filled 2019-12-27: qty 100

## 2019-12-27 MED ORDER — ACETAMINOPHEN 160 MG/5ML PO SOLN
650.0000 mg | ORAL | Status: DC | PRN
  Filled 2019-12-27: qty 20.3

## 2019-12-27 MED ORDER — SORBITOL 70 % SOLN: 30.0000 mL | Freq: Every day | Status: DC | PRN

## 2019-12-27 MED ORDER — ACETAMINOPHEN 650 MG RE SUPP
650.0000 mg | RECTAL | Status: DC | PRN
  Filled 2019-12-27: qty 1

## 2019-12-27 MED ORDER — CYCLOSPORINE 0.05 % OP EMUL
1.0000 [drp] | Freq: Two times a day (BID) | OPHTHALMIC | Status: DC
  Administered 2019-12-27 – 2020-01-09 (×26): 1 [drp] via OPHTHALMIC
  Filled 2019-12-27 (×27): qty 1

## 2019-12-27 MED ORDER — ROSUVASTATIN CALCIUM 20 MG PO TABS
40.0000 mg | ORAL_TABLET | Freq: Every day | ORAL | Status: DC
  Administered 2019-12-27 – 2020-01-08 (×13): 40 mg via ORAL
  Filled 2019-12-27 (×13): qty 2

## 2019-12-27 MED ORDER — ACETAMINOPHEN 325 MG PO TABS
650.0000 mg | ORAL_TABLET | ORAL | Status: DC | PRN
  Administered 2019-12-28 – 2020-01-09 (×14): 650 mg via ORAL
  Filled 2019-12-27 (×15): qty 2

## 2019-12-27 MED ORDER — LEVOTHYROXINE SODIUM 25 MCG PO TABS
137.0000 ug | ORAL_TABLET | Freq: Every day | ORAL | Status: DC
  Administered 2019-12-28 – 2020-01-09 (×13): 137 ug via ORAL
  Filled 2019-12-27 (×13): qty 1

## 2019-12-27 MED ORDER — INSULIN ASPART 100 UNIT/ML ~~LOC~~ SOLN: 15.0000 [IU] | Freq: Three times a day (TID) | SUBCUTANEOUS | 11 refills | Status: DC

## 2019-12-27 MED ORDER — IRBESARTAN 75 MG PO TABS
150.0000 mg | ORAL_TABLET | Freq: Every day | ORAL | Status: DC
  Administered 2019-12-28 – 2020-01-09 (×13): 150 mg via ORAL
  Filled 2019-12-27 (×13): qty 2

## 2019-12-27 MED ORDER — INSULIN DETEMIR 100 UNIT/ML ~~LOC~~ SOLN
40.0000 [IU] | Freq: Every day | SUBCUTANEOUS | Status: DC
  Administered 2019-12-27: 40 [IU] via SUBCUTANEOUS
  Filled 2019-12-27: qty 0.4

## 2019-12-27 MED ORDER — LIDOCAINE 5 % EX PTCH: 1.0000 | MEDICATED_PATCH | CUTANEOUS | 0 refills | Status: DC

## 2019-12-27 MED ORDER — MYCOPHENOLATE MOFETIL 250 MG PO CAPS
1500.0000 mg | ORAL_CAPSULE | Freq: Two times a day (BID) | ORAL | Status: DC
  Administered 2019-12-27 – 2020-01-09 (×26): 1500 mg via ORAL
  Filled 2019-12-27 (×27): qty 6

## 2019-12-27 MED ORDER — INSULIN DETEMIR 100 UNIT/ML ~~LOC~~ SOLN
40.0000 [IU] | Freq: Every day | SUBCUTANEOUS | Status: DC
  Administered 2019-12-28 – 2019-12-30 (×3): 40 [IU] via SUBCUTANEOUS
  Filled 2019-12-27 (×3): qty 0.4

## 2019-12-27 MED ORDER — SENNOSIDES-DOCUSATE SODIUM 8.6-50 MG PO TABS: 1.0000 | ORAL_TABLET | Freq: Every evening | ORAL | Status: DC | PRN

## 2019-12-27 MED ORDER — EZETIMIBE 10 MG PO TABS
10.0000 mg | ORAL_TABLET | Freq: Every day | ORAL | Status: DC
  Administered 2019-12-28 – 2020-01-09 (×13): 10 mg via ORAL
  Filled 2019-12-27 (×14): qty 1

## 2019-12-27 MED ORDER — INSULIN ASPART 100 UNIT/ML ~~LOC~~ SOLN
0.0000 [IU] | Freq: Three times a day (TID) | SUBCUTANEOUS | Status: DC
  Administered 2019-12-28: 11 [IU] via SUBCUTANEOUS
  Administered 2019-12-28: 3 [IU] via SUBCUTANEOUS
  Administered 2019-12-28: 15 [IU] via SUBCUTANEOUS
  Administered 2019-12-29: 11 [IU] via SUBCUTANEOUS
  Administered 2019-12-29: 5 [IU] via SUBCUTANEOUS
  Administered 2019-12-30: 12:00:00 11 [IU] via SUBCUTANEOUS
  Administered 2019-12-30: 2 [IU] via SUBCUTANEOUS
  Administered 2019-12-30: 8 [IU] via SUBCUTANEOUS
  Administered 2019-12-31 – 2020-01-01 (×3): 5 [IU] via SUBCUTANEOUS
  Administered 2020-01-01: 3 [IU] via SUBCUTANEOUS
  Administered 2020-01-01: 2 [IU] via SUBCUTANEOUS
  Administered 2020-01-02 (×2): 3 [IU] via SUBCUTANEOUS
  Administered 2020-01-02 – 2020-01-03 (×3): 8 [IU] via SUBCUTANEOUS
  Administered 2020-01-03: 2 [IU] via SUBCUTANEOUS
  Administered 2020-01-04 (×2): 3 [IU] via SUBCUTANEOUS
  Administered 2020-01-05 (×2): 5 [IU] via SUBCUTANEOUS
  Administered 2020-01-06: 2 [IU] via SUBCUTANEOUS
  Administered 2020-01-06 – 2020-01-08 (×5): 3 [IU] via SUBCUTANEOUS
  Administered 2020-01-08: 8 [IU] via SUBCUTANEOUS
  Administered 2020-01-08 – 2020-01-09 (×2): 2 [IU] via SUBCUTANEOUS

## 2019-12-27 MED ORDER — ENOXAPARIN SODIUM 40 MG/0.4ML ~~LOC~~ SOLN: 40.0000 mg | SUBCUTANEOUS | Status: DC

## 2019-12-27 MED ORDER — CLOPIDOGREL BISULFATE 75 MG PO TABS
75.0000 mg | ORAL_TABLET | Freq: Every day | ORAL | Status: DC
  Administered 2019-12-28 – 2020-01-09 (×13): 75 mg via ORAL
  Filled 2019-12-27 (×13): qty 1

## 2019-12-27 MED ORDER — LIDOCAINE 5 % EX PTCH
1.0000 | MEDICATED_PATCH | CUTANEOUS | Status: DC
  Filled 2019-12-27 (×4): qty 1

## 2019-12-27 NOTE — Progress Notes (Signed)
Nutrition Brief Note  RD previously consulted to assess pt after stroke. Pt has now been admitted for 8 days. Pt continues to report good appetite, but does express some dissatisfaction with meal options. Pt with no updated wts since admission.   Wt Readings from Last 15 Encounters:  12/19/19 81.6 kg  06/22/19 85.3 kg  04/14/19 85.8 kg  10/10/18 88 kg  04/08/18 80.5 kg  02/02/17 82 kg  11/12/16 81.6 kg  08/15/15 78 kg  08/09/15 79.4 kg  07/30/15 79.4 kg  07/09/15 79.8 kg  05/16/15 79.6 kg  09/19/14 74.2 kg  08/13/14 76.5 kg  07/24/14 75.8 kg    Body mass index is 31.89 kg/m. Patient meets criteria for obesity based on current BMI.   Current diet order is Heart Healthy/Carb Modified, patient is consuming approximately 70-100% of meals at this time. Labs and medications reviewed.   No nutrition interventions warranted at this time. If nutrition issues arise, please consult RD.   Larkin Ina, MS, RD, LDN RD pager number and weekend/on-call pager number located in [place].

## 2019-12-27 NOTE — Progress Notes (Signed)
Physical Therapy Treatment Patient Details Name: [name] MRN: [id number] DOB: 05-01-1951 Today's Date: 12/27/2019    History of Present Illness Pt is a 69 yo female presenting with c/o chest pain and R-sided face and arm weakness/numbness and tingling. Upon admission, imaging revealed acute L thalamic CVA. PMH includes: Myasthenia gravis, HTN, DM II, thyroid cancer, HCL, and OSA.    PT Comments    Patient continues to make progress toward PT goals. Continue to recommend CIR for further skilled PT services to maximize independence and safety with mobility.     Follow Up Recommendations  CIR;Supervision/Assistance - 24 hour     Equipment Recommendations  Other (comment)(TBD next venue)    Recommendations for Other Services Rehab consult     Precautions / Restrictions Precautions Precautions: Fall Precaution Comments: pt reports no recent falls, "knees give out" about 2x/month Restrictions Weight Bearing Restrictions: No    Mobility  Bed Mobility Overal bed mobility: Needs Assistance Bed Mobility: Supine to Sit     Supine to sit: Supervision;HOB elevated     General bed mobility comments: cues for use of R hand; use of rail  Transfers Overall transfer level: Needs assistance Equipment used: None Transfers: Sit to/from Stand Sit to Stand: Min assist         General transfer comment: assist once in standing for balance  Ambulation/Gait Ambulation/Gait assistance: Mod assist;Min assist Gait Distance (Feet): (120 ft with standing rest break) Assistive device: 1 person hand held assist(and assist at trunk with gait belt) Gait Pattern/deviations: Decreased step length - left;Decreased stance time - right;Decreased dorsiflexion - right;Decreased weight shift to right;Step-to pattern;Step-through pattern Gait velocity: decreased    General Gait Details: assist for weight shifting and balance; cues for sequencing; decreased R step clearance; rest break required  due to strain on L hip/soreness   Stairs             Wheelchair Mobility    Modified Rankin (Stroke Patients Only) Modified Rankin (Stroke Patients Only) Pre-Morbid Rankin Score: Slight disability Modified Rankin: Moderately severe disability     Balance Overall balance assessment: Needs assistance Sitting-balance support: Feet supported Sitting balance-Leahy Scale: Good     Standing balance support: During functional activity;Single extremity supported;Bilateral upper extremity supported Standing balance-Leahy Scale: Poor                              Cognition Arousal/Alertness: Awake/alert Behavior During Therapy: WFL for tasks assessed/performed Overall Cognitive Status: Within Functional Limits for tasks assessed                                        Exercises      General Comments        Pertinent Vitals/Pain Pain Assessment: Faces Faces Pain Scale: Hurts little more Pain Location: R shoulder Pain Descriptors / Indicators: Tightness;Discomfort Pain Intervention(s): Limited activity within patient's tolerance    Home Living                      Prior Function            PT Goals (current goals can now be found in the care plan section) Acute Rehab PT Goals Patient Stated Goal: return home Progress towards PT goals: Progressing toward goals    Frequency    Min 4X/week  PT Plan Current plan remains appropriate    Co-evaluation              AM-PAC PT "6 Clicks" Mobility   Outcome Measure  Help needed turning from your back to your side while in a flat bed without using bedrails?: None Help needed moving from lying on your back to sitting on the side of a flat bed without using bedrails?: A Little Help needed moving to and from a bed to a chair (including a wheelchair)?: A Little Help needed standing up from a chair using your arms (e.g., wheelchair or bedside chair)?: A Little Help needed  to walk in hospital room?: A Lot Help needed climbing 3-5 steps with a railing? : A Lot 6 Click Score: 17    End of Session Equipment Utilized During Treatment: Gait belt Activity Tolerance: Patient tolerated treatment well Patient left: with call bell/phone within reach;in chair;with chair alarm set Nurse Communication: Mobility status PT Visit Diagnosis: History of falling (Z91.81);Muscle weakness (generalized) (M62.81);Difficulty in walking, not elsewhere classified (R26.2)     Time: 1036-1100 PT Time Calculation (min) (ACUTE ONLY): 24 min  Charges:  $Gait Training: 23-37 mins                     Earney Navy, PTA Acute Rehabilitation Services Pager: [phone] Office: [phone]     Darliss Cheney 12/27/2019, 2:27 PM

## 2019-12-27 NOTE — Discharge Summary (Signed)
Physician Discharge Summary  [name]:[id number] DOB: [birth year] DOA: 12/19/2019  PCP: [name], DO  Admit date: 12/19/2019 Discharge date: 12/27/2019  Admitted From: Home Disposition: Acute inpatient rehab  Recommendations for Outpatient Follow-up:  1. Neurology follow-up to be scheduled after discharge from rehab.   Discharge Condition: Stable CODE STATUS: Full code Diet recommendation: Low-carb, low-sodium diet  Discharge summary: 69 year old female with extensive medical issues including type 2 diabetes on insulin pump, myasthenia gravis, thyroid cancer status post resection, obstructive sleep apnea on CPAP at night, hypertension, hyperlipidemia, carotid stenosis who presented to the hospital with right face and right arm weakness and numbness as well as chest discomfort.  Initially evaluated in the ER for chest pain, VQ scan was negative.  Ruled out of acute coronary syndrome.  Also found to have right sided hemiparesis and acute stroke.  # Chest pain: Atypical.  Acute coronary syndrome ruled out.  On aspirin and Plavix now. She had palpable tenderness along the left costal margins and below breast line.  Improved with lidocaine patches.   # Acute left thalamic stroke: Clinical findings, right facial sensory loss, right upper and lower extremity motor and sensory loss. MRI of the brain, left thalamic stroke, acute ischemic stroke suspected small vessel disease. Carotid Doppler, known coronary artery disease, not contributing to current stroke. Seen by vascular surgery. 2D echocardiogram, normal ejection fraction.  No source of emboli. Antiplatelet therapy, none at home.  Started on aspirin and Plavix for 3 weeks, then aspirin alone. LDL, 101.  Already on zetia.   Hemoglobin A1c, 9.8.  She has an insulin pump, has endocrinologist who is gradually working her up for insulin doses.  # Hypertension: Acceptable.  Home medications resumed.  # Hyperlipidemia: Continue  Zetia.  LDL 100.  # Type 2 diabetes with hyperglycemia, uncontrolled: Using insulin pump at home.  Started on similar dose of long-acting insulin and prandial insulin.   Blood sugar is still elevated.  Will change to Levemir 40 units daily, prandial insulin 15 units with meals.  Closely monitor and adjust with sliding scale insulin.   # Myasthenia gravis: Patient on maintenance Rituxan injection, next is scheduled after a month.  She also has IVIG scheduled next week that she will reschedule.  Patient on prednisone and CellCept that she will continue.  # Obstructive sleep apnea: Not wearing CPAP from hospital.  Has brought her own CPAP.  Allow her to use her own CPAP.  Patient is medically stable.  She will benefit with multi disciplinary rehab.  Stable to transfer to acute inpatient rehab level of care.  Discharge Diagnoses:  Principal Problem:   Thalamic stroke [facility]) Active Problems:   Myasthenia gravis ([place])   Essential hypertension, benign   DM (diabetes mellitus) type II uncontrolled with eye manifestation (HCC)   OSA (obstructive sleep apnea)   Cancer of thyroid ([place])   Asymptomatic carotid artery stenosis, bilateral   Hypercholesterolemia    Discharge Instructions  Discharge Instructions    Diet - low sodium heart healthy   Complete by: As directed    Diet Carb Modified   Complete by: As directed    Increase activity slowly   Complete by: As directed      Allergies as of 12/27/2019      Reactions   Contrast Media [iodinated Diagnostic Agents] Anaphylaxis   01-10-15---PT GIVEN 13 HR PRE MEDS FOR CT--PT TOLERATED IV CONTRAST W/O ANY REACTION------[name] RT-R CT   Fluorescein Shortness Of Breath, Other (See Comments)   (  Dye)   Iodine Anaphylaxis   Contrast dye - iodine   Molds & Smuts Shortness Of Breath, Other (See Comments)   Congestion and wheezing, also   Shellfish-derived Products Anaphylaxis, Shortness Of Breath, Swelling, Other (See Comments)   Welts,  also   Codeine Hives, Nausea Only   Other Rash, Other (See Comments)   Coban causes welts, also      Medication List    STOP taking these medications   Airborne Elderberry Chew   Butrans 10 MCG/HR Ptwk patch Generic drug: buprenorphine   diphenhydrAMINE in sodium chloride 0.9 % 50 mL   HumaLOG 100 UNIT/ML injection Generic drug: insulin lispro   ibuprofen 800 MG tablet Commonly known as: ADVIL   OmniPod Dash System Kit   simvastatin 40 MG tablet Commonly known as: ZOCOR   Transderm-Scop (1.5 MG) 1 MG/3DAYS Generic drug: scopolamine     TAKE these medications   aspirin 81 MG EC tablet Take 1 tablet (81 mg total) by mouth daily. Start taking on: Dec 28, 2019   B-D ULTRAFINE III SHORT PEN 31G X 8 MM Misc Generic drug: Insulin Pen Needle   Biotin 5000 MCG Caps Take 5,000 mcg by mouth daily with breakfast.   Bydureon BCise 2 MG/0.85ML Auij Generic drug: Exenatide ER Inject 2 mg into the skin every Monday.   canagliflozin 300 MG Tabs tablet Commonly known as: INVOKANA Take 300 mg by mouth daily before breakfast.   clopidogrel 75 MG tablet Commonly known as: PLAVIX Take 1 tablet (75 mg total) by mouth daily for 18 days. Start taking on: Dec 28, 2019   cycloSPORINE 0.05 % ophthalmic emulsion Commonly known as: RESTASIS Place 1 drop into both eyes 2 (two) times daily.   diclofenac sodium 1 % Gel Commonly known as: VOLTAREN Apply 2 g topically daily as needed (to painful sites).   diphenhydrAMINE 25 mg capsule Commonly known as: BENADRYL Take 25 mg by mouth every 6 (six) hours as needed for allergies.   esomeprazole 40 MG capsule Commonly known as: NEXIUM Take 40 mg by mouth 2 (two) times daily.   ezetimibe 10 MG tablet Commonly known as: ZETIA Take 1 tablet (10 mg total) by mouth daily.   FreeStyle Libre 14 Day Reader [name] 1 Device by Does not apply route as directed.   FreeStyle Libre 14 Day Sensor Misc Inject 1 patch into the skin every 14  (fourteen) days.   Immune Globulin (Human) 40 GM/400ML Soln Inject as directed See admin instructions. At home, for 2 days, every 6 weeks   insulin aspart 100 UNIT/ML injection Commonly known as: novoLOG Inject 15 Units into the skin 3 (three) times daily with meals.   insulin detemir 100 UNIT/ML injection Commonly known as: LEVEMIR Inject 0.4 mLs (40 Units total) into the skin daily. Start taking on: Dec 28, 2019   levalbuterol 0.63 MG/3ML nebulizer solution Commonly known as: XOPENEX Take 0.63 mg by nebulization every 6 (six) hours as needed for wheezing or shortness of breath.   Levothyroxine Sodium 137 MCG Caps Take 137 mcg by mouth daily before breakfast.   lidocaine 5 % Commonly known as: LIDODERM Place 1 patch onto the skin daily. Remove & Discard patch within 12 hours or as directed by MD   meclizine 25 MG tablet Commonly known as: ANTIVERT Take 25 mg by mouth 3 (three) times daily as needed for dizziness (or migraine-related nausea).   mometasone 50 MCG/ACT nasal spray Commonly known as: NASONEX Place 2 sprays into the nose  daily as needed (for allergies).   montelukast 10 MG tablet Commonly known as: SINGULAIR Take 10 mg by mouth every morning.   mycophenolate 500 MG tablet Commonly known as: CELLCEPT Take 1,500 mg by mouth in the morning and at bedtime.   olmesartan 20 MG tablet Commonly known as: BENICAR Take 20 mg by mouth daily.   predniSONE 10 MG tablet Commonly known as: DELTASONE Take 10 mg by mouth every morning.   PRESCRIPTION MEDICATION See admin instructions. CPAP- At bedtime   ProAir HFA 108 (90 Base) MCG/ACT inhaler Generic drug: albuterol Inhale 2 puffs into the lungs every 6 (six) hours as needed for wheezing or shortness of breath.   Rituxan 500 MG/50ML injection Generic drug: riTUXimab Inject 1,000 mg into the vein See admin instructions. 1,000 mg via IV, then another 1,000 mg in two weeks- repeat after 6 months: "riTUXimab (RITUXAN)  1,000 mg in sodium chloride 0.9% 1,000 mL infusion- Begin infusion at 50 mL/hr, if no hypersensitivity or infusion-related events occur, may increase by 50 mL/hr every 30 minutes to a maximum infusion rate of 400 mL/hr"   rosuvastatin 40 MG tablet Commonly known as: CRESTOR Take 40 mg by mouth at bedtime.   spironolactone 25 MG tablet Commonly known as: ALDACTONE Take 25 mg by mouth daily.   Symbicort 160-4.5 MCG/ACT inhaler Generic drug: budesonide-formoterol USE 2 INHALATIONS TWICE A DAY TO PREVENT COUGH OR WHEEZE. RINSE, GARGLE AND SPIT AFTER USE What changed: See the new instructions.   vitamin B-12 1000 MCG tablet Commonly known as: CYANOCOBALAMIN Take 1,000 mcg by mouth daily.   Vitamin D (Ergocalciferol) 1.25 MG (50000 UNIT) Caps capsule Commonly known as: DRISDOL Take 50,000 Units by mouth every Monday.   zolpidem 5 MG tablet Commonly known as: AMBIEN Take 5 mg by mouth at bedtime as needed for sleep.       Allergies  Allergen Reactions  . Contrast Media [Iodinated Diagnostic Agents] Anaphylaxis    01-10-15---PT GIVEN 13 HR PRE MEDS FOR CT--PT TOLERATED IV CONTRAST W/O ANY REACTION------[name] RT-R CT  . Fluorescein Shortness Of Breath and Other (See Comments)    (Dye)  . Iodine Anaphylaxis    Contrast dye - iodine  . Molds & Smuts Shortness Of Breath and Other (See Comments)    Congestion and wheezing, also  . Shellfish-Derived Products Anaphylaxis, Shortness Of Breath, Swelling and Other (See Comments)    Welts, also  . Codeine Hives and Nausea Only  . Other Rash and Other (See Comments)    Coban causes welts, also    Consultations:  Neurology  Rehab   Procedures/Studies: DG Chest 2 View  Result Date: 12/19/2019 CLINICAL DATA:  Left-sided chest pain EXAM: CHEST - 2 VIEW COMPARISON:  06/14/2017 FINDINGS: Right-sided central venous port tip over the proximal right atrium. Low lung volumes. No consolidation, pleural effusion or pneumothorax.  Borderline to mild cardiomegaly. Aortic atherosclerosis. No pneumothorax. IMPRESSION: Low lung volumes. Borderline to mild cardiomegaly. Electronically Signed   By: Donavan Foil M.D.   On: 12/19/2019 17:20   MR ANGIO HEAD WO CONTRAST  Result Date: 12/21/2019 CLINICAL DATA:  Left thalamic stroke EXAM: MRA HEAD WITHOUT CONTRAST TECHNIQUE: Angiographic images of the Circle of Willis were obtained using MRA technique without intravenous contrast. COMPARISON:  MRI head 12/20/2019 FINDINGS: Both vertebral arteries widely patent. Right PICA patent. Left PICA is small and appears to arise from the distal left vertebral artery. Right AICA patent. Basilar widely patent. Superior cerebellar and posterior cerebral arteries patent bilaterally.  Internal carotid artery widely patent bilaterally. Anterior and middle cerebral arteries patent. Mild stenosis of the origin of a branch of the superior division of the right middle cerebral artery. This vessel also shows a moderately severe distal stenosis. Negative for cerebral aneurysm. IMPRESSION: Atherosclerotic disease right middle cerebral artery branch otherwise negative. No large vessel occlusion. Electronically Signed   By: Franchot Gallo M.D.   On: 12/21/2019 17:28   MR BRAIN WO CONTRAST  Result Date: 12/20/2019 CLINICAL DATA:  Neuro deficit, acute, stroke suspected. Additional history provided: Right facial numbness and right arm weakness, known history of carotid disease. EXAM: MRI HEAD WITHOUT CONTRAST TECHNIQUE: Multiplanar, multiecho pulse sequences of the brain and surrounding structures were obtained without intravenous contrast. COMPARISON:  Report from head CT 05/24/2000 (images unavailable). FINDINGS: Brain: There is a 7 mm focus of restricted diffusion within the left thalamus consistent with acute lacunar infarct (series 5, image 30). Corresponding T2/FLAIR hyperintensity at this site. No acute infarct is identified elsewhere within the brain. No significant  white matter disease for age. Cerebral volume is normal. No evidence of intracranial mass. No chronic intracranial blood products. No extra-axial fluid collection. No midline shift. Vascular: Expected proximal arterial flow voids. Skull and upper cervical spine: No focal marrow lesion. Sequela of prior cervical spine fusion with susceptibility artifact arising from spinal fusion hardware. Adjacent level disease at C2-C3 with a posterior disc osteophyte complex and ligamentum flavum hypertrophy contributing to suspected at least mild/moderate spinal canal stenosis. Sinuses/Orbits: Visualized orbits show no acute finding. Right maxillary sinus mucous retention cyst. Mild ethmoid and left sphenoid sinus mucosal thickening. Trace fluid within the bilateral mastoid air cells. 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 Otherwise unremarkable MRI appearance of the brain for age. Paranasal sinus disease as described. Trace bilateral mastoid effusions. Sequela of prior cervical fusion. C2-C3 adjacent level disease with suspected at least mild/moderate spinal canal stenosis. Electronically Signed   By: Kellie Simmering DO   On: 12/20/2019 12:56   NM Pulmonary Perfusion  Result Date: 12/20/2019 CLINICAL DATA:  Left side chest pain, shortness of breath EXAM: NUCLEAR MEDICINE PERFUSION LUNG SCAN TECHNIQUE: Perfusion images were obtained in multiple projections after intravenous injection of radiopharmaceutical. Ventilation scans intentionally deferred if perfusion scan and chest x-ray adequate for interpretation during COVID 19 epidemic. RADIOPHARMACEUTICALS:  1.55 mCi Tc-56mMAA IV COMPARISON:  Chest x-ray 12/19/2019 FINDINGS: No perfusion defects seen to suggest pulmonary embolus. IMPRESSION: No evidence of pulmonary embolus. Electronically Signed   By: KRolm BaptiseM.D.   On: 12/20/2019 08:16   MR ANGIO CHEST W WO CONTRAST  Result Date: 12/20/2019 CLINICAL DATA:  69year old female with severe contrast  allergy and acute chest pain concerning for possible dissection. EXAM: MRA CHEST WITH OR WITHOUT CONTRAST TECHNIQUE: Angiographic images of the chest were obtained using MRA technique with intravenous contrast. CONTRAST:  815mGADAVIST GADOBUTROL 1 MMOL/ML IV SOLN COMPARISON:  None. FINDINGS: VASCULAR Aorta: Conventional 3 vessel arch anatomy. No evidence of dissection, aneurysm or acute thoracic aortic abnormality. Mildly irregular atherosclerotic plaque present in the descending thoracic aorta. No penetrating ulceration. Signal void in the proximal left subclavian artery over approximately 2 cm may represent an intermediate segment occlusion. Heart: The heart is normal in size. However, there is significant concentric hypertrophy of the ventricular myocardium. Pulmonary Arteries:  Normal in caliber.  No large PE. Other: Right IJ approach single-lumen power injectable port catheter is present. The catheter tip terminates at the superior cavoatrial junction. NON-VASCULAR Mediastinum: No  mediastinal mass or adenopathy. Lungs/pleura. No focal signal abnormality or abnormal enhancement. No evidence of pleural effusion. Upper abdomen: 1.8 cm simple cyst in the upper pole of the right kidney. No acute abnormality within the upper abdomen. Musculoskeletal: No focal signal abnormality or abnormal enhancement. IMPRESSION: VASCULAR 1. No evidence of acute aortic dissection, aortic aneurysm or other acute vascular abnormality. 2. 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 3. Heterogeneous and irregular/ulcerated atherosclerotic plaque along the descending thoracic aorta. 4. Concentric hypertrophy of the left ventricular myocardium suggests chronic systolic hypertension. NON-VASCULAR 1. No acute abnormality within the visualized upper abdomen. 2. Simple right upper pole renal cyst. Electronically Signed   By: Jacqulynn Cadet M.D.   On: 12/20/2019  15:01   ECHOCARDIOGRAM COMPLETE  Result Date: 12/21/2019    ECHOCARDIOGRAM REPORT   Patient Name:   [name] Date of Exam: 12/20/2019 Medical Rec #:  [id number]       Height:       63.0 in Accession #:    [id number]      Weight:       180.0 lb Date of Birth:  Jul 31, 1951        BSA:          1.849 m Patient Age:    64 years        BP:           123/70 mmHg Patient Gender: F               HR:           100 bpm. Exam Location:  Inpatient Procedure: 2D Echo Indications:     stroke 434.91  History:         Patient has prior history of Echocardiogram examinations, most                  recent 05/29/2015. Risk Factors:Hypertension, Diabetes,                  Dyslipidemia and Sleep Apnea.  Sonographer:     [place] Referring Phys:  [place] Diagnosing Phys: Adrian Prows MD  Sonographer Comments: Image acquisition challenging due to respiratory motion. IMPRESSIONS  1. Left ventricular ejection fraction, by estimation, is 60 to 65%. The left ventricle has normal function. The left ventricle has no regional wall motion abnormalities. Left ventricular diastolic parameters are consistent with Grade I diastolic dysfunction (impaired relaxation).  2. Right ventricular systolic function is normal. The right ventricular size is normal.  3. Left atrial size was moderately dilated.  4. The mitral valve is grossly normal. Trivial mitral valve regurgitation. No evidence of mitral stenosis.  5. Mild calcification of the non coronary cusp.. The aortic valve is tricuspid. Aortic valve regurgitation is not visualized. No aortic stenosis is present.  6. The inferior vena cava is normal in size with greater than 50% respiratory variability, suggesting right atrial pressure of 3 mmHg. FINDINGS  Left Ventricle: Left ventricular ejection fraction, by estimation, is 60 to 65%. The left ventricle has normal function. The left ventricle has no regional wall motion abnormalities. The left ventricular internal cavity size  was small. There is no left ventricular hypertrophy. Left ventricular diastolic parameters are consistent with Grade I diastolic dysfunction (impaired relaxation). Normal left ventricular filling pressure. Right Ventricle: The right ventricular size is normal. No increase in right ventricular wall thickness. Right ventricular systolic function is normal. Left Atrium:  Left atrial size was moderately dilated. Right Atrium: Right atrial size was normal in size. Pericardium: There is no evidence of pericardial effusion. Mitral Valve: The mitral valve is grossly normal. Mild to moderate mitral annular calcification. Trivial mitral valve regurgitation. No evidence of mitral valve stenosis. Tricuspid Valve: The tricuspid valve is normal in structure. Tricuspid valve regurgitation is not demonstrated. Aortic Valve: Mild calcification of the non coronary cusp. The aortic valve is tricuspid. Aortic valve regurgitation is not visualized. No aortic stenosis is present. There is mild calcification of the aortic valve. Pulmonic Valve: The pulmonic valve was not well visualized. Pulmonic valve regurgitation is trivial. Aorta: The aortic root is normal in size and structure. Venous: The inferior vena cava is normal in size with greater than 50% respiratory variability, suggesting right atrial pressure of 3 mmHg. IAS/Shunts: No atrial level shunt detected by color flow Doppler.  LEFT VENTRICLE PLAX 2D LVIDd:         3.20 cm  Diastology LVIDs:         2.00 cm  LV e' lateral:   7.94 cm/s LV PW:         1.20 cm  LV E/e' lateral: 8.9 LV IVS:        1.00 cm  LV e' medial:    5.87 cm/s LVOT diam:     1.80 cm  LV E/e' medial:  12.1 LV SV:         63 LV SV Index:   34 LVOT Area:     2.54 cm  RIGHT VENTRICLE RV S prime:     15.70 cm/s LEFT ATRIUM             Index       RIGHT ATRIUM          Index LA diam:        2.90 cm 1.57 cm/m  RA Area:     8.67 cm LA Vol (A2C):   33.3 ml 18.01 ml/m RA Volume:   15.10 ml 8.17 ml/m LA Vol (A4C):    34.1 ml 18.44 ml/m LA Biplane Vol: 33.6 ml 18.17 ml/m  AORTIC VALVE LVOT Vmax:   132.00 cm/s LVOT Vmean:  91.800 cm/s LVOT VTI:    0.248 m  AORTA Ao Root diam: 3.10 cm MITRAL VALVE MV Area (PHT): 4.36 cm     SHUNTS MV Decel Time: 174 msec     Systemic VTI:  0.25 m MV E velocity: 71.00 cm/s   Systemic Diam: 1.80 cm MV A velocity: 113.00 cm/s MV E/A ratio:  0.63 Adrian Prows MD Electronically signed by Adrian Prows MD Signature Date/Time: 12/21/2019/7:35:14 AM    Final    VAS [place] LOWER EXTREMITY VENOUS (DVT) (ONLY [facility] & [facility])  Result Date: 12/20/2019  Lower Venous DVTStudy Indications: Edema.  Comparison Study: no prior Performing Technologist: June Leap RDMS, RVT  Examination Guidelines: A complete evaluation includes B-mode imaging, spectral Doppler, color Doppler, and power Doppler as needed of all accessible portions of each vessel. Bilateral testing is considered an integral part of a complete examination. Limited examinations for reoccurring indications may be performed as noted. The reflux portion of the exam is performed with the patient in reverse Trendelenburg.  +---------+---------------+---------+-----------+----------+--------------+ RIGHT    CompressibilityPhasicitySpontaneityPropertiesThrombus Aging +---------+---------------+---------+-----------+----------+--------------+ CFV      Full           Yes      Yes                                 +---------+---------------+---------+-----------+----------+--------------+  SFJ      Full                                                        +---------+---------------+---------+-----------+----------+--------------+ FV Prox  Full                                                        +---------+---------------+---------+-----------+----------+--------------+ FV Mid   Full                                                        +---------+---------------+---------+-----------+----------+--------------+ FV DistalFull                                                         +---------+---------------+---------+-----------+----------+--------------+ PFV      Full                                                        +---------+---------------+---------+-----------+----------+--------------+ POP      Full           Yes      Yes                                 +---------+---------------+---------+-----------+----------+--------------+ PTV      Full                                                        +---------+---------------+---------+-----------+----------+--------------+ PERO     Full                                                        +---------+---------------+---------+-----------+----------+--------------+   +---------+---------------+---------+-----------+----------+--------------+ LEFT     CompressibilityPhasicitySpontaneityPropertiesThrombus Aging +---------+---------------+---------+-----------+----------+--------------+ CFV      Full           Yes      Yes                                 +---------+---------------+---------+-----------+----------+--------------+ SFJ      Full                                                        +---------+---------------+---------+-----------+----------+--------------+  FV Prox  Full                                                        +---------+---------------+---------+-----------+----------+--------------+ FV Mid   Full                                                        +---------+---------------+---------+-----------+----------+--------------+ FV DistalFull                                                        +---------+---------------+---------+-----------+----------+--------------+ PFV      Full                                                        +---------+---------------+---------+-----------+----------+--------------+ POP      Full           Yes      Yes                                  +---------+---------------+---------+-----------+----------+--------------+ PTV      Full                                                        +---------+---------------+---------+-----------+----------+--------------+ PERO     Full                                                        +---------+---------------+---------+-----------+----------+--------------+     Summary: RIGHT: - There is no evidence of deep vein thrombosis in the lower extremity.  - No cystic structure found in the popliteal fossa.  LEFT: - There is no evidence of deep vein thrombosis in the lower extremity.  - No cystic structure found in the popliteal fossa.  *See table(s) above for measurements and observations. Electronically signed by Servando Snare MD on 12/20/2019 at 8:24:18 PM.    Final    PCV CAROTID DUPLEX (BILATERAL)  Result Date: 12/17/2019 Carotid artery duplex  12/11/2019: Stenosis in the right internal carotid artery (>=70%). Peak velocity 295/100 cm/S. The right PSV internal/common carotid artery ratio of 4.14 is consistent with a stenosis of >70%. 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 Antegrade right vertebral artery flow. Antegrade left vertebral artery flow. Follow up in six months is appropriate if clinically indicated. Compared to the study done on 04/10/2019, right ICA stenosis has progressed.    Subjective: Patient was seen and examined.  No overnight events.  Eager to go to rehab.   Discharge Exam: Vitals:   12/27/19 0338 12/27/19 0851  BP: 140/61 (!) 120/45  Pulse: 84 89  Resp: 17 20  Temp: 98.4 F (36.9 C) 98.1 F (36.7 C)  SpO2: 96% 98%   Vitals:   12/26/19 2028 12/26/19 2328 12/27/19 0338 12/27/19 0851  BP: (!) 119/55 (!) 131/57 140/61 (!) 120/45  Pulse: 95 85 84 89  Resp: _0 Temp: 97.9 F (36.6 C) 98.2 F (36.8 C) 98.4 F (36.9 C) 98.1 F (36.7 C)  TempSrc: Oral Oral Oral Oral  SpO2: 97% 97% 96% 98%  Weight:      Height:        General: Pt  is alert, awake, not in acute distress Cardiovascular: RRR, S1/S2 +, no rubs, no gallops, patient has a Port-A-Cath on her right chest. Respiratory: CTA bilaterally, no wheezing, no rhonchi Abdominal: Soft, NT, ND, bowel sounds + Extremities: no edema, no cyanosis Neuro exam: Superficial sensory loss on the right side of the cheek. Right upper and lower extremity decreased power, 4/5 on upper extremity, 4/5 in lower extremity.    The results of significant diagnostics from this hospitalization (including imaging, microbiology, ancillary and laboratory) are listed below for reference.     Microbiology: Recent Results (from the past 240 hour(s))  Respiratory Panel by RT PCR (Flu A&B, Covid) - Nasopharyngeal Swab     Status: None   Collection Time: 12/20/19  3:00 PM   Specimen: Nasopharyngeal Swab  Result Value Ref Range Status   SARS Coronavirus 2 by RT PCR NEGATIVE NEGATIVE Final    Comment: (NOTE) SARS-CoV-2 target nucleic acids are NOT DETECTED. The SARS-CoV-2 RNA is generally detectable in upper respiratoy specimens during the acute phase of infection. The lowest concentration of SARS-CoV-2 viral copies this assay can detect is 131 copies/mL. A negative result does not preclude SARS-Cov-2 infection and should not be used as the sole basis for treatment or other patient management decisions. A negative result may occur with  improper specimen collection/handling, submission of specimen other than nasopharyngeal swab, presence of viral mutation(s) within the areas targeted by this assay, and inadequate number of viral copies (<131 copies/mL). A negative result must be combined with clinical observations, patient history, and epidemiological information. The expected result is Negative. Fact Sheet for Patients:  PinkCheek.be Fact Sheet for Healthcare Providers:  GravelBags.it This test is not yet ap proved or cleared by the  [place] FDA and  has been authorized for detection and/or diagnosis of SARS-CoV-2 by FDA under an Emergency Use Authorization (EUA). This EUA will remain  in effect (meaning this test can be used) for the duration of the COVID-19 declaration under Section 564(b)(1) of the Act, 21 U.S.C. section 360bbb-3(b)(1), unless the authorization is terminated or revoked sooner.    Influenza A by PCR NEGATIVE NEGATIVE Final   Influenza B by PCR NEGATIVE NEGATIVE Final    Comment: (NOTE) The Xpert Xpress SARS-CoV-2/FLU/RSV assay is intended as an aid in  the diagnosis of influenza from Nasopharyngeal swab specimens and  should not be used as a sole basis for treatment. Nasal washings and  aspirates are unacceptable for Xpert Xpress SARS-CoV-2/FLU/RSV  testing. Fact Sheet for Patients: PinkCheek.be Fact Sheet for Healthcare Providers: GravelBags.it This test is not yet approved or cleared by the [place] FDA and  has been authorized for detection and/or diagnosis of SARS-CoV-2 by  FDA under an Emergency Use Authorization (EUA). This EUA will remain  in effect (meaning this test can be used) for the duration of the  Covid-19 declaration under Section 564(b)(1) of the Act, 21  U.S.C. section 360bbb-3(b)(1), unless the authorization is  terminated or revoked. Performed at [facility], [place] [address] [place], [place] [address]      Labs: BNP (last 3 results) Recent Labs    12/19/19 1141  BNP 17.4   Basic Metabolic Panel: Recent Labs  Lab 12/23/19 1300  GLUCOSE 467*   Liver Function Tests: No results for input(s): AST, ALT, ALKPHOS, BILITOT, PROT, ALBUMIN in the last 168 hours. No results for input(s): LIPASE, AMYLASE in the last 168 hours. No results for input(s): AMMONIA in the last 168 hours. CBC: No results for input(s): WBC, NEUTROABS, HGB, HCT, MCV, PLT in the last 168 hours. Cardiac Enzymes: No results  for input(s): CKTOTAL, CKMB, CKMBINDEX, TROPONINI in the last 168 hours. BNP: Invalid input(s): POCBNP CBG: Recent Labs  Lab 12/26/19 0615 12/26/19 1155 12/26/19 1528 12/26/19 2130 12/27/19 0612  GLUCAP 201* 146* 301* 239* 145*   D-Dimer No results for input(s): DDIMER in the last 72 hours. Hgb A1c No results for input(s): HGBA1C in the last 72 hours. Lipid Profile No results for input(s): CHOL, HDL, LDLCALC, TRIG, CHOLHDL, LDLDIRECT in the last 72 hours. Thyroid function studies No results for input(s): TSH, T4TOTAL, T3FREE, THYROIDAB in the last 72 hours.  Invalid input(s): FREET3 Anemia work up No results for input(s): VITAMINB12, FOLATE, FERRITIN, TIBC, IRON, RETICCTPCT in the last 72 hours. Urinalysis    Component Value Date/Time   COLORURINE YELLOW 06/25/2009 0920   APPEARANCEUR CLEAR 06/25/2009 0920   LABSPEC 1.029 06/25/2009 0920   PHURINE 5.5 06/25/2009 0920   GLUCOSEU 250 (A) 06/25/2009 0920   HGBUR NEGATIVE 06/25/2009 0920   BILIRUBINUR NEGATIVE 06/25/2009 0920   KETONESUR NEGATIVE 06/25/2009 0920   PROTEINUR 30 (A) 06/25/2009 0920   UROBILINOGEN 0.2 06/25/2009 0920   NITRITE NEGATIVE 06/25/2009 0920   LEUKOCYTESUR NEGATIVE 06/25/2009 0920   Sepsis Labs Invalid input(s): PROCALCITONIN,  WBC,  LACTICIDVEN Microbiology Recent Results (from the past 240 hour(s))  Respiratory Panel by RT PCR (Flu A&B, Covid) - Nasopharyngeal Swab     Status: None   Collection Time: 12/20/19  3:00 PM   Specimen: Nasopharyngeal Swab  Result Value Ref Range Status   SARS Coronavirus 2 by RT PCR NEGATIVE NEGATIVE Final    Comment: (NOTE) SARS-CoV-2 target nucleic acids are NOT DETECTED. The SARS-CoV-2 RNA is generally detectable in upper respiratoy specimens during the acute phase of infection. The lowest concentration of SARS-CoV-2 viral copies this assay can detect is 131 copies/mL. A negative result does not preclude SARS-Cov-2 infection and should not be used as the sole  basis for treatment or other patient management decisions. A negative result may occur with  improper specimen collection/handling, submission of specimen other than nasopharyngeal swab, presence of viral mutation(s) within the areas targeted by this assay, and inadequate number of viral copies (<131 copies/mL). A negative result must be combined with clinical observations, patient history, and epidemiological information. The expected result is Negative. Fact Sheet for Patients:  PinkCheek.be Fact Sheet for Healthcare Providers:  GravelBags.it This test is not yet ap proved or cleared by the [place] FDA and  has been authorized for detection and/or diagnosis of SARS-CoV-2 by FDA under an Emergency Use Authorization (EUA). This EUA will remain  in effect (meaning this test can be used) for the duration of the COVID-19 declaration under Section 564(b)(1)  of the Act, 21 U.S.C. section 360bbb-3(b)(1), unless the authorization is terminated or revoked sooner.    Influenza A by PCR NEGATIVE NEGATIVE Final   Influenza B by PCR NEGATIVE NEGATIVE Final    Comment: (NOTE) The Xpert Xpress SARS-CoV-2/FLU/RSV assay is intended as an aid in  the diagnosis of influenza from Nasopharyngeal swab specimens and  should not be used as a sole basis for treatment. Nasal washings and  aspirates are unacceptable for Xpert Xpress SARS-CoV-2/FLU/RSV  testing. Fact Sheet for Patients: PinkCheek.be Fact Sheet for Healthcare Providers: GravelBags.it This test is not yet approved or cleared by the [place] FDA and  has been authorized for detection and/or diagnosis of SARS-CoV-2 by  FDA under an Emergency Use Authorization (EUA). This EUA will remain  in effect (meaning this test can be used) for the duration of the  Covid-19 declaration under Section 564(b)(1) of the Act, 21  U.S.C.  section 360bbb-3(b)(1), unless the authorization is  terminated or revoked. Performed at [facility], [place] [address], [place], [place] [address]      Time coordinating discharge:  35 minutes  SIGNED:   Barb Merino, MD  [facility] 12/27/2019, 10:22 AM

## 2019-12-27 NOTE — Progress Notes (Signed)
Report received from [place], [place]. Patient is A&O x4, continent, takes meds whole with water. No skin issues, chest port accessed today. Patient has right side weakness, noted more to the arm and hand, leg has more movement. Patient is 1 assist with a walker,AC/HS with sliding scale. Patient arrived about 1840 via wheelchair transport by staff with her sister at bedside.

## 2019-12-27 NOTE — Plan of Care (Signed)

## 2019-12-27 NOTE — Progress Notes (Signed)
[name] Arn, MD  Physician  Physical Medicine and Rehabilitation  PMR Pre-admission     Addendum  Date of Service:  12/22/2019  6:14 PM      Related encounter: ED to Hosp-Admission (Current) from 12/19/2019 in [facility]        PMR Admission Coordinator Pre-Admission Assessment   Patient: [name] is an 69 y.o., female MRN: [id number] DOB: Jul 02, 1951 Height: 5\' 3"  (160 cm) Weight: 81.6 kg                                                                                                                                                  Insurance Information HMO:     PPO: yes     PCP:      IPA:      80/20:      OTHER:  PRIMARY: U[employer] Medicare      Policy#: [id number]      Subscriber: patient CM Name: [name]      Phone#: [phone]     Fax#: [phone] Pre-Cert#: [id number]      Employer:  [employer] [phone]) Everlene Balls ref #: [id number]) provided by [name] on 5/4 for admit to CIR. Pt is approved for 7 days with next review date set for 5/10. Fax clinical updates to (f): [phone] (p) [phone] Benefits:  Phone #: online     Name: [email or web address] Eff. Date: 08/25/19-present     Deduct: $0 (does not have deductible)      Out of Pocket Max: $4,500 [phone])      Life Max: NA  CIR: $325/day co-pay for days 1-5, $0/day co-pay for days 6+      SNF: $0/day co-pay for days 1-20, $184/day co-pay for days 21-45, $0/day co-pay for days 46-100; limited to 100 days/cal yr Outpatient: $35/visit co-pay; limited by medical necessity      Home Health: 100% coverage; 0% co-insurance; limited by medical necessity       DME: 80% coverage     Co-Pay: 20% co-insurance Providers:  SECONDARY: None      Policy#:       Phone#:    Development worker, community:       Phone#:    The Engineer, petroleum" for patients in Inpatient Rehabilitation Facilities with attached "Privacy Act [employer] Records" was provided and verbally reviewed with: Patient   Emergency  Contact Information Contact Information       Name Relation Home Work Mobile    [place] Sister     [phone]         Current Medical History  Patient Admitting Diagnosis: 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    History of Present Illness: [name] is a 69 year old right-handed female history of diabetes mellitus, ACDF 2014, chronic  pain syndrome, myasthenia gravis maintained on CellCept as well as chronic prednisone and receives IVIG at [facility], asthma /OSA on CPAP, thyroid cancer, hypertension, hyperlipidemia.  Per chart review lives alone independent prior to admission and driving.  1 level home.  She does have family in the area that worked during the day.  Presented 12/20/2019 with right side weakness.  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  Nuclear medicine perfusion scan no pulmonary embolus.  Patient with recent carotid Doppler noting stenosis right ICA greater than 70% left ICA stenosis 50 to 69%.  Echocardiogram ejection fraction [id number] grade 1 diastolic dysfunction.  Admission chemistries potassium 3.3, troponin negative, BNP 215.  Currently maintained on aspirin and Plavix for CVA prophylaxis.  Subcutaneous Lovenox for DVT prophylaxis.  Vascular surgery Dr. Donzetta Matters follow-up in regards to carotid stenosis no current plan for surgical intervention.  She is tolerating a regular diet.  Therapy evaluations completed and patient is to be admitted for a comprehensive rehab program on 12/27/19.   Complete NIHSS TOTAL: 2 Glasgow Coma Scale Score: 15   Past Medical History      Past Medical History:  Diagnosis Date  . Arthritis      "all over my body" (03/13/2013)  . Asthma    . Asymptomatic carotid artery stenosis, bilateral 10/08/2018  . GERD (gastroesophageal reflux disease)    . H/O hiatal hernia    . Heart murmur    . Hypercholesterolemia 10/08/2018  . Hypertension     . Hypothyroidism    . Myasthenia gravis ([place])      "in my eyes; diagnsosed > 7 yr ago" (03/13/2013)  . Sleep apnea      on CPAP  . Thyroid carcinoma ([place])    . Type II diabetes mellitus (HCC)        Family History  family history includes Asthma in her father and mother; Congestive Heart Failure in her father and mother; Coronary artery disease in her sister; Diabetes in her brother, father, mother, and sister; Lung cancer in her father; Stroke (age of onset: 90) in her sister.   Prior Rehab/Hospitalizations:  Has the patient had prior rehab or hospitalizations prior to admission? No   Has the patient had major surgery during 100 days prior to admission? No   Current Medications    Current Facility-Administered Medications:  .  acetaminophen (TYLENOL) tablet 650 mg, 650 mg, Oral, Q4H PRN, 650 mg at 12/26/19 1426 **OR** acetaminophen (TYLENOL) 160 MG/5ML solution 650 mg, 650 mg, Per Tube, Q4H PRN **OR** acetaminophen (TYLENOL) suppository 650 mg, 650 mg, Rectal, Q4H PRN, Karmen Bongo, MD .  albuterol (PROVENTIL) (2.5 MG/3ML) 0.083% nebulizer solution 2.5 mg, 2.5 mg, Nebulization, Q4H PRN, Karmen Bongo, MD .  aspirin EC tablet 81 mg, 81 mg, Oral, Daily, Barb Merino, MD, 81 mg at 12/27/19 0955 .  Chlorhexidine Gluconate Cloth 2 % PADS 6 each, 6 each, Topical, Daily, Karmen Bongo, MD, 6 each at 12/27/19 [phone] .  clopidogrel (PLAVIX) tablet 75 mg, 75 mg, Oral, Daily, Barb Merino, MD, 75 mg at 12/27/19 0954 .  cycloSPORINE (RESTASIS) 0.05 % ophthalmic emulsion 1 drop, 1 drop, Both Eyes, BID, Karmen Bongo, MD, 1 drop at 12/27/19 0957 .  diclofenac Sodium (VOLTAREN) 1 % topical gel 2 g, 2 g, Topical, QID, Barb Merino, MD, 2 g at 12/27/19 0954 .  enoxaparin (LOVENOX) injection 40 mg, 40 mg, Subcutaneous, Q24H, Karmen Bongo, MD, 40 mg at 12/26/19 1556 .  ezetimibe (ZETIA)  tablet 10 mg, 10 mg, Oral, Daily, Karmen Bongo, MD, 10 mg at 12/27/19 0955 .  insulin aspart  (novoLOG) injection 0-15 Units, 0-15 Units, Subcutaneous, TID WC, Barb Merino, MD, 2 Units at 12/27/19 [phone] .  insulin aspart (novoLOG) injection 0-5 Units, 0-5 Units, Subcutaneous, QHS, Opyd, Ilene Qua, MD, 2 Units at 12/26/19 2210 .  insulin aspart (novoLOG) injection 15 Units, 15 Units, Subcutaneous, TID WC, Barb Merino, MD, 15 Units at 12/27/19 0636 .  insulin detemir (LEVEMIR) injection 40 Units, 40 Units, Subcutaneous, Daily, Barb Merino, MD, 40 Units at 12/27/19 0956 .  irbesartan (AVAPRO) tablet 150 mg, 150 mg, Oral, Daily, Barb Merino, MD, 150 mg at 12/27/19 0955 .  levothyroxine (SYNTHROID) tablet 137 mcg, 137 mcg, Oral, QAC breakfast, Karmen Bongo, MD, 137 mcg at 12/27/19 [phone] .  lidocaine (LIDODERM) 5 % 1 patch, 1 patch, Transdermal, Q24H, Barb Merino, MD, 1 patch at 12/26/19 1205 .  montelukast (SINGULAIR) tablet 10 mg, 10 mg, Oral, Ledell Noss, MD, 10 mg at 12/27/19 [phone] .  mycophenolate (CELLCEPT) capsule 1,500 mg, 1,500 mg, Oral, BID, Karmen Bongo, MD, 1,500 mg at 12/27/19 0958 .  pantoprazole (PROTONIX) EC tablet 40 mg, 40 mg, Oral, Daily, Karmen Bongo, MD, 40 mg at 12/27/19 0954 .  predniSONE (DELTASONE) tablet 10 mg, 10 mg, Oral, Ledell Noss, MD, 10 mg at 12/27/19 [phone] .  rosuvastatin (CRESTOR) tablet 40 mg, 40 mg, Oral, QHS, Karmen Bongo, MD, 40 mg at 12/26/19 2213 .  senna-docusate (Senokot-S) tablet 1 tablet, 1 tablet, Oral, QHS PRN, Karmen Bongo, MD, 1 tablet at 12/24/19 1006 .  sodium chloride flush (NS) 0.9 % injection 10-40 mL, 10-40 mL, Intracatheter, Q12H, Karmen Bongo, MD, 10 mL at 12/27/19 0959 .  sodium chloride flush (NS) 0.9 % injection 10-40 mL, 10-40 mL, Intracatheter, PRN, Karmen Bongo, MD, 10 mL at 12/21/19 1900 .  spironolactone (ALDACTONE) tablet 25 mg, 25 mg, Oral, Daily, Ghimire, Kuber, MD, 25 mg at 12/27/19 0955 .  zolpidem (AMBIEN) tablet 5 mg, 5 mg, Oral, QHS PRN, Karmen Bongo, MD, 5 mg at  12/25/19 2138   Patients Current Diet:  Diet Order                  Diet - low sodium heart healthy          Diet Carb Modified          Diet heart healthy/carb modified Room service appropriate? Yes; Fluid consistency: Thin  Diet effective ____                      Precautions / Restrictions Precautions Precautions: Fall Precaution Comments: pt reports no recent falls, "knees give out" about 2x/month Restrictions Weight Bearing Restrictions: No    Has the patient had 2 or more falls or a fall with injury in the past year?No   Prior Activity Level Community (5-7x/wk): retired but active in the community; Independent PTA. still drives   Prior Functional Level Prior Function Level of Independence: Independent Comments: Pt independent in all ADL, IADL, and mobility tasks. Pt does not ambulate with an assistive device and reports 0 falls in the last 6 months. Pt still drives and is retired.    Self Care: Did the patient need help bathing, dressing, using the toilet or eating?  Independent   Indoor Mobility: Did the patient need assistance with walking from room to room (with or without device)? Independent   Stairs: Did the patient need assistance with  internal or external stairs (with or without device)? Independent   Functional Cognition: Did the patient need help planning regular tasks such as shopping or remembering to take medications? Independent   Home Assistive Devices / Equipment Home Equipment: Kasandra Knudsen - single point, Thomas - quad, Grab bars - tub/shower   Prior Device Use: Indicate devices/aids used by the patient prior to current illness, exacerbation or injury? None of the above   Current Functional Level Cognition   Arousal/Alertness: Awake/alert Overall Cognitive Status: Within Functional Limits for tasks assessed Orientation Level: Oriented X4 General Comments: Pt pleasant and willing to participate in therapy tasks. Pt able to follow multi-step  instructions without difficulty.  Attention: Selective Selective Attention: Appears intact Memory: Appears intact Awareness: Appears intact Problem Solving: Appears intact Executive Function: Reasoning Reasoning: Appears intact    Extremity Assessment (includes Sensation/Coordination)   Upper Extremity Assessment: Defer to OT evaluation, RUE deficits/detail RUE Deficits / Details: pt reports numbness and tingling in RUE starting at wrist, generally 4-/5 RUE RUE Sensation: decreased light touch RUE Coordination: decreased fine motor, decreased gross motor  Lower Extremity Assessment: RLE deficits/detail RLE Deficits / Details: pt reports slightly decreased sensation entire RLE, 4-/5 globally RLE Sensation: decreased light touch     ADLs   Overall ADL's : Needs assistance/impaired Eating/Feeding: Minimal assistance, Sitting Eating/Feeding Details (indicate cue type and reason): cues for integration and use of R hands into tasks (opening container, lids, using hand to hold utensitl to eat), intermittent assistance with more difficult packages Grooming: Wash/dry hands, Wash/dry face, Oral care, Standing, Min guard, Supervision/safety Grooming Details (indicate cue type and reason): cues for involving RUE into ADLs Upper Body Bathing: Set up, Supervision/ safety, Sitting Lower Body Bathing: Supervison/ safety, Min guard, Sit to/from stand Upper Body Dressing : Minimal assistance, Sitting Upper Body Dressing Details (indicate cue type and reason): to new hospital gown as back side cover Lower Body Dressing: Min guard, Sitting/lateral leans Lower Body Dressing Details (indicate cue type and reason): to pull socks up from EOB Toilet Transfer: Minimal assistance, Ambulation Toilet Transfer Details (indicate cue type and reason): simulated via functional mobility; increased time and effort noted needing MIN A for balance and support on R side Toileting- Clothing Manipulation and Hygiene:  Minimal assistance, Sitting/lateral lean, Sit to/from stand Toileting - Clothing Manipulation Details (indicate cue type and reason): s seated, heavy min a to stand and pull up underwear while using both hands to pull up Functional mobility during ADLs: Minimal assistance General ADL Comments: pt continues to present with RUE inattention, decreased endurance and decreased RUE Norris impacting pts ability to engage in ADLs. Session focus on increasing functional mobility distance, standing grooming tasks and RUE HEP with theraputty     Mobility   Overal bed mobility: Needs Assistance Bed Mobility: Supine to Sit Rolling: Min assist Sidelying to sit: Min guard Supine to sit: Supervision, HOB elevated General bed mobility comments: pt OOB in chair upon arrival     Transfers   Overall transfer level: Needs assistance Equipment used: None Transfers: Sit to/from Stand Sit to Stand: Min assist Stand pivot transfers: Min assist, Mod assist General transfer comment: assist once in standing for balance; pt able to power up with min guard for safety and cues to use R hand      Ambulation / Gait / Stairs / Wheelchair Mobility   Ambulation/Gait Ambulation/Gait assistance: Mod assist Gait Distance (Feet): (100 ft with 2 standing rest breaks) Assistive device: 1 person hand held assist(and assist at  trunk with gait belt) Gait Pattern/deviations: Decreased step length - left, Decreased stance time - right, Decreased dorsiflexion - right, Decreased weight shift to right, Step-to pattern, Step-through pattern General Gait Details: assist for weight shifting and balance; cues for sequencing; decreased R step clearance; LOB posteriorly  Gait velocity: decreased  Gait velocity interpretation: <1.8 ft/sec, indicate of risk for recurrent falls     Posture / Balance Balance Overall balance assessment: Needs assistance Sitting-balance support: Feet supported Sitting balance-Leahy Scale: Good Standing balance  support: During functional activity, Single extremity supported, Bilateral upper extremity supported Standing balance-Leahy Scale: Poor Standing balance comment: brief period of unsupported standing at sink with MINA     Special needs/care consideration Diabetic management: yes Uses home CPAP        Previous Home Environment (from acute therapy documentation) Living Arrangements: Alone  Lives With: Alone Available Help at Discharge: Family, Available PRN/intermittently Type of Home: House Home Layout: One level Home Access: Stairs to enter Entrance Stairs-Rails: None Entrance Stairs-Number of Steps: side: 2 without rail; front door: 5 without rail Bathroom Shower/Tub: Chiropodist: Standard Home Care Services: Yes Type of Home Care Services: Home RN Additional Comments: pt reports she is unsure of AD, it was all her mothers.   Discharge Living Setting Plans for Discharge Living Setting: Alone, House Type of Home at Discharge: House Discharge Home Layout: One level Discharge Home Access: Stairs to enter Entrance Stairs-Rails: None Entrance Stairs-Number of Steps: 2 Discharge Bathroom Shower/Tub: Tub/shower unit Discharge Bathroom Toilet: Standard Discharge Bathroom Accessibility: No How Accessible: Other (comment)(pt not sure RW would fit. ) Does the patient have any problems obtaining your medications?: Yes (Describe)(states financial reasons)   Social/Family/Support Systems Patient Roles: Other (Comment)(lives independently; has sister in [place] who is close) Contact Information: sister: Pamala Hurry [phone] Anticipated Caregiver: Pamala Hurry can check in 1x/day if needed Anticipated Caregiver's Contact Information: see above Ability/Limitations of Caregiver: supervision  Caregiver Availability: Other (Comment)(1x/day if needed; pt has Mod I goals) Discharge Plan Discussed with Primary Caregiver: Yes(pt and Pamala Hurry) Is Caregiver In Agreement with Plan?:  Yes Does Caregiver/Family have Issues with Lodging/Transportation while Pt is in Rehab?: No     Goals Patient/Family Goal for Rehab: PT/OT: Supervision/Mod I; SLP: NA Expected length of stay: 12-14 days  Pt/Family Agrees to Admission and willing to participate: Yes Program Orientation Provided & Reviewed with Pt/Caregiver Including Roles  & Responsibilities: Yes(pt and her sister )  Barriers to Discharge: Home environment access/layout, Lack of/limited family support  Barriers to Discharge Comments: steps to enter home; limited family assist.      Decrease burden of Care through IP rehab admission: NA     Possible need for SNF placement upon discharge:Not anticipated; pt has Mod I goals and has a sister who can check in daily if needed.      Patient Condition: This patient's medical and functional status has changed since the consult dated: 12/21/19 in which the Rehabilitation Physician determined and documented that the patient's condition is appropriate for intensive rehabilitative care in an inpatient rehabilitation facility. See "History of Present Illness" (above) for medical update. Functional changes are: pt remains at [place] level for tranfers and while she has progressed in gait distance from 30 feet to 100 feet, her assistance level has decreased from Glasgow G to Mod A. Pt remains at [place] to [place] A ADLs. Patient's medical and functional status update has been discussed with the Rehabilitation physician and patient remains appropriate for inpatient rehabilitation. Will  admit to inpatient rehab today.   Preadmission Screen Completed By:  Raechel Ache, OT, 12/27/2019 10:39 AM ______________________________________________________________________   Discussed status with Dr. Posey Pronto on 12/27/19 at 10:13AM and received approval for admission today.   Admission Coordinator:  Raechel Ache, time 10:13AM/Date 12/27/19.         Revision History

## 2019-12-27 NOTE — H&P (Signed)
Physical Medicine and Rehabilitation Admission H&P    Chief Complaint  Patient presents with  . Chest Pain  : HPI: [name]. Figiel is a 69 year old right-handed female history of diabetes mellitus, ACDF 2014, chronic pain syndrome, myasthenia gravis maintained on CellCept as well as chronic prednisone and receives IVIG at [facility], asthma /OSA on CPAP, thyroid cancer, hypertension, hyperlipidemia. History taken from chart review and patient. Patient lives alone independent prior to admission and driving.  1 level home.  She does have family in the area that worked during the day.  She presented on 12/20/19 with right hemiparesis.  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  Nuclear medicine perfusion scan no pulmonary embolus.  Patient with recent carotid Doppler noting right ICA greater than 70% and left ICA 50-69% stenosis.  Echocardiogram with ejection fraction of 40%, grade 1 diastolic dysfunction.  Admission chemistries potassium 3.3, troponin negative, BNP 215.  Currently maintained on aspirin and Plavix for CVA prophylaxis.  Subcutaneous Lovenox for DVT prophylaxis.  Vascular surgery Dr. Donzetta Matters follow-up in regards to carotid stenosis no current plan for surgical intervention.  She is tolerating a regular diet.  Therapy evaluations completed and patient was admitted for a comprehensive rehab program.  Please see preadmission assessment from earlier today as well.  Review of Systems  Constitutional: Negative for chills and fever.  HENT: Negative for hearing loss.   Eyes: Negative for blurred vision and double vision.  Respiratory: Negative for cough and shortness of breath.   Cardiovascular: Negative for chest pain and palpitations.  Gastrointestinal: Negative for heartburn, nausea and vomiting.       GERD  Genitourinary: Negative for dysuria, flank pain and hematuria.  Musculoskeletal: Positive for myalgias.  Skin: Negative for rash.  Neurological: Positive for  sensory change, focal weakness and weakness.   Past Medical History:  Diagnosis Date  . Arthritis    "all over my body" (03/13/2013)  . Asthma   . Asymptomatic carotid artery stenosis, bilateral 10/08/2018  . GERD (gastroesophageal reflux disease)   . H/O hiatal hernia   . Heart murmur   . Hypercholesterolemia 10/08/2018  . Hypertension   . Hypothyroidism   . Myasthenia gravis ([place])    "in my eyes; diagnsosed > 7 yr ago" (03/13/2013)  . Sleep apnea    on CPAP  . Thyroid carcinoma ([place])   . Type II diabetes mellitus ([place])    Past Surgical History:  Procedure Laterality Date  . ABDOMINAL HYSTERECTOMY    . ANTERIOR CERVICAL DECOMP/DISCECTOMY FUSION     "I've had severa ORs; always went in from the front" (03/13/2013)  . APPENDECTOMY    . CARDIAC CATHETERIZATION     "several" (03/13/2013)  . CARPAL TUNNEL RELEASE Right   . CATARACT EXTRACTION W/ INTRAOCULAR LENS  IMPLANT, BILATERAL Bilateral   . CHOLECYSTECTOMY    . KNEE ARTHROSCOPY Left   . SHOULDER ARTHROSCOPY W/ ROTATOR CUFF REPAIR Left   . TONSILLECTOMY    . TOTAL THYROIDECTOMY     Family History  Problem Relation Age of Onset  . Coronary artery disease Sister        s/p coronary stenting  . Stroke Sister 50  . Diabetes Sister   . Congestive Heart Failure Mother   . Asthma Mother   . Diabetes Mother   . Congestive Heart Failure Father   . Lung cancer Father   . Asthma Father   . Diabetes Father   . Diabetes Brother  Social History:  reports that she has never smoked. She has never used smokeless tobacco. She reports that she does not drink alcohol or use drugs. Allergies:  Allergies  Allergen Reactions  . Contrast Media [Iodinated Diagnostic Agents] Anaphylaxis    01-10-15---PT GIVEN 13 HR PRE MEDS FOR CT--PT TOLERATED IV CONTRAST W/O ANY REACTION------[name] RT-R CT  . Fluorescein Shortness Of Breath and Other (See Comments)    (Dye)  . Iodine Anaphylaxis    Contrast dye - iodine  . Molds & Smuts  Shortness Of Breath and Other (See Comments)    Congestion and wheezing, also  . Shellfish-Derived Products Anaphylaxis, Shortness Of Breath, Swelling and Other (See Comments)    Welts, also  . Codeine Hives and Nausea Only  . Other Rash and Other (See Comments)    Coban causes welts, also   Medications Prior to Admission  Medication Sig Dispense Refill  . Biotin 5000 MCG CAPS Take 5,000 mcg by mouth daily with breakfast.     . BUTRANS 10 MCG/HR PTWK patch Place 1 patch onto the skin See admin instructions. Place 1 patch onto the skin every 7 days as needed for pain    . BYDUREON BCISE 2 MG/0.85ML AUIJ Inject 2 mg into the skin every Monday.     . canagliflozin (INVOKANA) 300 MG TABS tablet Take 300 mg by mouth daily before breakfast.    . Continuous Blood Gluc Receiver (FREESTYLE LIBRE 14 DAY READER) DEVI 1 Device by Does not apply route as directed.     . Continuous Blood Gluc Sensor (FREESTYLE LIBRE 14 DAY SENSOR) MISC Inject 1 patch into the skin every 14 (fourteen) days.    . cycloSPORINE (RESTASIS) 0.05 % ophthalmic emulsion Place 1 drop into both eyes 2 (two) times daily.    . diclofenac sodium (VOLTAREN) 1 % GEL Apply 2 g topically daily as needed (to painful sites).     . diphenhydrAMINE (BENADRYL) 25 mg capsule Take 25 mg by mouth every 6 (six) hours as needed for allergies.    . diphenhydrAMINE in sodium chloride 0.9 % 50 mL Inject 50 mg into the vein as directed. 50 mg with all infusions    . esomeprazole (NEXIUM) 40 MG capsule Take 40 mg by mouth 2 (two) times daily.    Marland Kitchen ezetimibe (ZETIA) 10 MG tablet Take 1 tablet (10 mg total) by mouth daily. 90 tablet 1  . ibuprofen (ADVIL,MOTRIN) 800 MG tablet Take 800 mg by mouth See admin instructions. Take 800 mg by mouth every 4-6 hours as needed for pain or headaches    . Immune Globulin, Human, 40 GM/400ML SOLN Inject as directed See admin instructions. At home, for 2 days, every 6 weeks    . Insulin Disposable Pump (OMNIPOD DASH  SYSTEM) KIT continuous.     . insulin lispro (HUMALOG) 100 UNIT/ML injection Inject into the skin See admin instructions. Per insulin pump (Omnipod Dash)    . levalbuterol (XOPENEX) 0.63 MG/3ML nebulizer solution Take 0.63 mg by nebulization every 6 (six) hours as needed for wheezing or shortness of breath.     . Levothyroxine Sodium 137 MCG CAPS Take 137 mcg by mouth daily before breakfast.     . meclizine (ANTIVERT) 25 MG tablet Take 25 mg by mouth 3 (three) times daily as needed for dizziness (or migraine-related nausea).     . Misc Natural Products (AIRBORNE ELDERBERRY) CHEW Chew 1 tablet by mouth daily.    . mometasone (NASONEX) 50 MCG/ACT nasal spray  Place 2 sprays into the nose daily as needed (for allergies).     . montelukast (SINGULAIR) 10 MG tablet Take 10 mg by mouth every morning.    . mycophenolate (CELLCEPT) 500 MG tablet Take 1,500 mg by mouth in the morning and at bedtime.     Marland Kitchen olmesartan (BENICAR) 20 MG tablet Take 20 mg by mouth daily.    . predniSONE (DELTASONE) 10 MG tablet Take 10 mg by mouth every morning.    Marland Kitchen PRESCRIPTION MEDICATION See admin instructions. CPAP- At bedtime    . PROAIR HFA 108 (90 BASE) MCG/ACT inhaler Inhale 2 puffs into the lungs every 6 (six) hours as needed for wheezing or shortness of breath.     . riTUXimab (RITUXAN) 500 MG/50ML injection Inject 1,000 mg into the vein See admin instructions. 1,000 mg via IV, then another 1,000 mg in two weeks- repeat after 6 months: "riTUXimab (RITUXAN) 1,000 mg in sodium chloride 0.9% 1,000 mL infusion- Begin infusion at 50 mL/hr, if no hypersensitivity or infusion-related events occur, may increase by 50 mL/hr every 30 minutes to a maximum infusion rate of 400 mL/hr"    . spironolactone (ALDACTONE) 25 MG tablet Take 25 mg by mouth daily.    . TRANSDERM-SCOP, 1.5 MG, 1 MG/3DAYS Place 1 patch onto the skin every three (3) days as needed ("for migraine relief").     . vitamin B-12 (CYANOCOBALAMIN) 1000 MCG tablet Take  1,000 mcg by mouth daily.    . Vitamin D, Ergocalciferol, (DRISDOL) 50000 UNITS CAPS capsule Take 50,000 Units by mouth every Monday.     . zolpidem (AMBIEN) 5 MG tablet Take 5 mg by mouth at bedtime as needed for sleep.     . B-D ULTRAFINE III SHORT PEN 31G X 8 MM MISC     . rosuvastatin (CRESTOR) 40 MG tablet Take 40 mg by mouth at bedtime.    . simvastatin (ZOCOR) 40 MG tablet Take 1 tablet (40 mg total) by mouth daily at 6 PM. (Patient not taking: Reported on 12/19/2019) 90 tablet 1  . SYMBICORT 160-4.5 MCG/ACT inhaler USE 2 INHALATIONS TWICE A DAY TO PREVENT COUGH OR WHEEZE. RINSE, GARGLE AND SPIT AFTER USE (Patient taking differently: Inhale 2 puffs into the lungs See admin instructions. ) 30.6 g 1    Drug Regimen Review Drug regimen was reviewed and remains appropriate with no significant issues identified  Home: Home Living Family/patient expects to be discharged to:: Private residence Living Arrangements: Alone Available Help at Discharge: Family, Available PRN/intermittently Type of Home: House Home Access: Stairs to enter [employer] of Steps: side: 2 without rail; front door: 5 without rail Entrance Stairs-Rails: None Home Layout: One level Bathroom Shower/Tub: Chiropodist: Standard Home Equipment: Cane - single point, [employer] - quad, Grab bars - tub/shower Additional Comments: pt reports she is unsure of AD, it was all her mothers.  Lives With: Alone   Functional History: Prior Function Level of Independence: Independent Comments: Pt independent in all ADL, IADL, and mobility tasks. Pt does not ambulate with an assistive device and reports 0 falls in the last 6 months. Pt still drives and is retired.   Functional Status:  Mobility: Bed Mobility Overal bed mobility: Needs Assistance Bed Mobility: Supine to Sit Rolling: Min assist Sidelying to sit: Min guard Supine to sit: Supervision, HOB elevated General bed mobility comments: pt OOB in  chair upon arrival Transfers Overall transfer level: Needs assistance Equipment used: None Transfers: Sit to/from Stand Sit to  Stand: Min assist Stand pivot transfers: Min assist, Mod assist General transfer comment: assist once in standing for balance; pt able to power up with min guard for safety and cues to use R hand  Ambulation/Gait Ambulation/Gait assistance: Mod assist Gait Distance (Feet): (100 ft with 2 standing rest breaks) Assistive device: 1 person hand held assist(and assist at trunk with gait belt) Gait Pattern/deviations: Decreased step length - left, Decreased stance time - right, Decreased dorsiflexion - right, Decreased weight shift to right, Step-to pattern, Step-through pattern General Gait Details: assist for weight shifting and balance; cues for sequencing; decreased R step clearance; LOB posteriorly  Gait velocity: decreased  Gait velocity interpretation: <1.8 ft/sec, indicate of risk for recurrent falls    ADL: ADL Overall ADL's : Needs assistance/impaired Eating/Feeding: Minimal assistance, Sitting Eating/Feeding Details (indicate cue type and reason): cues for integration and use of R hands into tasks (opening container, lids, using hand to hold utensitl to eat), intermittent assistance with more difficult packages Grooming: Wash/dry hands, Wash/dry face, Oral care, Standing, Min guard, Supervision/safety Grooming Details (indicate cue type and reason): cues for involving RUE into ADLs Upper Body Bathing: Set up, Supervision/ safety, Sitting Lower Body Bathing: Supervison/ safety, Min guard, Sit to/from stand Upper Body Dressing : Minimal assistance, Sitting Upper Body Dressing Details (indicate cue type and reason): to new hospital gown as back side cover Lower Body Dressing: Min guard, Sitting/lateral leans Lower Body Dressing Details (indicate cue type and reason): to pull socks up from EOB Toilet Transfer: Minimal assistance, Ambulation Toilet Transfer  Details (indicate cue type and reason): simulated via functional mobility; increased time and effort noted needing MIN A for balance and support on R side Toileting- Clothing Manipulation and Hygiene: Minimal assistance, Sitting/lateral lean, Sit to/from stand Toileting - Clothing Manipulation Details (indicate cue type and reason): s seated, heavy min a to stand and pull up underwear while using both hands to pull up Functional mobility during ADLs: Minimal assistance General ADL Comments: pt continues to present with RUE inattention, decreased endurance and decreased RUE [place] impacting pts ability to engage in ADLs. Session focus on increasing functional mobility distance, standing grooming tasks and RUE HEP with theraputty  Cognition: Cognition Overall Cognitive Status: Within Functional Limits for tasks assessed Arousal/Alertness: Awake/alert Orientation Level: Oriented X4 Attention: Selective Selective Attention: Appears intact Memory: Appears intact Awareness: Appears intact Problem Solving: Appears intact Executive Function: Reasoning Reasoning: Appears intact Cognition Arousal/Alertness: Awake/alert Behavior During Therapy: WFL for tasks assessed/performed Overall Cognitive Status: Within Functional Limits for tasks assessed General Comments: Pt pleasant and willing to participate in therapy tasks. Pt able to follow multi-step instructions without difficulty.   Physical Exam: Blood pressure (!) 139/58, pulse 96, temperature 98.5 F (36.9 C), temperature source Oral, resp. rate 20, height '5\' 3"'  (1.6 m), weight 81.6 kg, SpO2 96 %. Physical Exam  Vitals reviewed. Constitutional: She appears well-developed and well-nourished.  HENT:  Head: Normocephalic and atraumatic.  Exophthalmos  Eyes: EOM are normal. Right eye exhibits no discharge. Left eye exhibits no discharge.  Neck: No tracheal deviation present. No thyromegaly present.  Respiratory: Effort normal. No stridor. No  respiratory distress.  GI: Soft. She exhibits no distension.  Musculoskeletal:     Comments: No edema or tenderness in extremities  Neurological: She is alert.  Patient is alert in no acute distress.   Follows full commands  Alert oriented x3 Motor: RUE: shoulder abduction 4/5, elbow flex/ext 4/5, hand grip 3-/5 RLE: 3/5 proximal to distal Sensation diminished to  light touch on right side Facial weakness  Skin: Skin is warm and dry.  Psychiatric: She has a normal mood and affect. Her behavior is normal. Thought content normal.    Results for orders placed or performed during the hospital encounter of 12/19/19 (from the past 48 hour(s))  Glucose, capillary     Status: Abnormal   Collection Time: 12/25/19  4:26 PM  Result Value Ref Range   Glucose-Capillary 228 (H) 70 - 99 mg/dL    Comment: Glucose reference range applies only to samples taken after fasting for at least 8 hours.  Glucose, capillary     Status: Abnormal   Collection Time: 12/25/19  9:13 PM  Result Value Ref Range   Glucose-Capillary 255 (H) 70 - 99 mg/dL    Comment: Glucose reference range applies only to samples taken after fasting for at least 8 hours.   Comment 1 Notify RN    Comment 2 Document in Chart   Glucose, capillary     Status: Abnormal   Collection Time: 12/26/19  6:15 AM  Result Value Ref Range   Glucose-Capillary 201 (H) 70 - 99 mg/dL    Comment: Glucose reference range applies only to samples taken after fasting for at least 8 hours.   Comment 1 Notify RN    Comment 2 Document in Chart   Glucose, capillary     Status: Abnormal   Collection Time: 12/26/19 11:55 AM  Result Value Ref Range   Glucose-Capillary 146 (H) 70 - 99 mg/dL    Comment: Glucose reference range applies only to samples taken after fasting for at least 8 hours.  Glucose, capillary     Status: Abnormal   Collection Time: 12/26/19  3:28 PM  Result Value Ref Range   Glucose-Capillary 301 (H) 70 - 99 mg/dL    Comment: Glucose  reference range applies only to samples taken after fasting for at least 8 hours.  Glucose, capillary     Status: Abnormal   Collection Time: 12/26/19  9:30 PM  Result Value Ref Range   Glucose-Capillary 239 (H) 70 - 99 mg/dL    Comment: Glucose reference range applies only to samples taken after fasting for at least 8 hours.   Comment 1 Notify RN    Comment 2 Document in Chart   Glucose, capillary     Status: Abnormal   Collection Time: 12/27/19  6:12 AM  Result Value Ref Range   Glucose-Capillary 145 (H) 70 - 99 mg/dL    Comment: Glucose reference range applies only to samples taken after fasting for at least 8 hours.   Comment 1 Notify RN    Comment 2 Document in Chart   Glucose, capillary     Status: Abnormal   Collection Time: 12/27/19 11:40 AM  Result Value Ref Range   Glucose-Capillary 303 (H) 70 - 99 mg/dL    Comment: Glucose reference range applies only to samples taken after fasting for at least 8 hours.   No results found.     Medical Problem List and Plan: 1.  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  Antithrombotics: -DVT/anticoagulation: Lovenox  -antiplatelet therapy: Aspirin 81 mg daily and Plavix 75 mg daily x3 weeks then aspirin alone 3. Pain Management: Lidoderm patch, Voltaren gel as needed 4. Mood: Provide emotional support  -antipsychotic agents: N/A 5. Neuropsych: This patient is capable of making decisions  on her own behalf. 6. Skin/Wound Care: Routine skin checks 7. Fluids/Electrolytes/Nutrition: Routine in and outs.  CMP ordered. 8.  Bilateral carotid stenosis.  Follow-up outpatient vascular as outpatient. 9.  Myasthenia gravis.  Continue Cellcept 1500 BID, prednisone 10 mg daily.  She is followed at [facility] and also receives IVIG. 10.  Diabetes mellitus with hyperglycemia.  Hemoglobin A1c 9.1.  NovoLog 15  units 3 times daily, Levemir 35 units daily.  Check blood sugars before meals and at bedtime.  Monitor with increased mobility 11.  OSA/asthma.  Singulair 10 mg daily, continue inhalers as directed as well as CPAP.  Monitor with increased exertion 12.Hypertension.  Avapro 150 mg daily, Aldactone 25 mg daily.    Monitor with increased mobility  13.  Hypothyroidism.  Synthroid 14.  Hyperlipidemia.  Zetia/Crestor 15.  GERD.  Protonix  Lavon Paganini Angiulli, PA-C 12/27/2019  I have personally performed a face to face diagnostic evaluation, including, but not limited to relevant history and physical exam findings, of this patient and developed relevant assessment and plan.  Additionally, I have reviewed and concur with the physician assistant's documentation above.  Delice Lesch, MD, ABPMR  The patient's status has not changed. The original post admission physician evaluation remains appropriate, and any changes from the pre-admission screening or documentation from the acute chart are noted above.   Delice Lesch, MD, ABPMR

## 2019-12-27 NOTE — Progress Notes (Signed)
Inpatient Rehabilitation-Admissions Coordinator   I have received medical clearance from Dr. Sloan Leiter for admit to CIR today. Pt notified of bed offer and has accepted. We reviewed insurance benefits letter and consent forms. TOC team and RN notified of plan for today.   Please call if questions.   Raechel Ache, OTR/L  Rehab Admissions Coordinator  [phone] 12/27/2019 10:41 AM

## 2019-12-27 NOTE — TOC Transition Note (Signed)
Transition of Care [facility]) - CM/SW Discharge Note   Patient Details  Name: [name] MRN: [id number] Date of Birth: 1951-05-12  [facility] CM/SW Contact:  Pollie Friar, RN Phone Number: 12/27/2019, 10:47 AM   Clinical Narrative:    Pt is discharging to CIR today. CM signing off.    Final next level of care: IP Rehab Facility Barriers to Discharge: No Barriers Identified   Patient Goals and CMS Choice        Discharge Placement                       Discharge Plan and Services                                     Social Determinants of Health (SDOH) Interventions     Readmission Risk Interventions No flowsheet data found.

## 2019-12-27 NOTE — Progress Notes (Signed)
[name] Heys, MD  Physician  Physical Medicine and Rehabilitation  Consult Note     Signed  Date of Service:  12/21/2019  2:16 PM      Related encounter: ED to Hosp-Admission (Current) from 12/19/2019 in [facility]      Signed      Expand AllCollapse All            Physical Medicine and Rehabilitation Consult Reason for Consult: Right side weakness Referring Physician: [facility]     HPI: [name] is a 69 y.o. right-handed female with history of diabetes mellitus, myasthenia gravis, OSA on CPAP, thyroid cancer, hypertension, hyperlipidemia.  Per chart review patient lives alone independent prior to admission and driving.  1 level home.  Presented 12/20/2019 with right side weakness.  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  Nuclear medicine perfusion scan with no pulmonary embolus.  Patient recent carotid duplex noting stenosis right internal carotid artery greater than 70% and left ICA stenosis 50-69%.  Echo with ejection fraction of 94% grade 1 diastolic dysfunction.  Admission chemistries with potassium 3.3, troponin negative, BNP 21.5.  Currently maintained on aspirin Plavix for CVA prophylaxis.  Subcutaneous Lovenox for DVT prophylaxis.  Vascular surgery consulted in regards to carotid stenosis awaiting plan of care.  Therapy evaluations completed with recommendations physical medicine rehab consult.   LBM today.  Still c/o L chest discomfort- stabbing, throbbing pain- under bra line on L chest.  Also has R face and hand numbness that's stable.  Voiding well     Review of Systems  Constitutional: Negative for chills and fever.  HENT: Negative for hearing loss.   Eyes: Negative for blurred vision and double vision.  Respiratory: Negative for cough and shortness of breath.   Cardiovascular: Negative for chest pain, palpitations and leg swelling.  Gastrointestinal: Positive for constipation. Negative for heartburn, nausea and vomiting.       GERD  Genitourinary: Negative for flank pain.  Musculoskeletal: Positive for myalgias.  Skin: Negative for rash.  Neurological: Positive for dizziness, sensory change and weakness.  All other systems reviewed and are negative.       Past Medical History:  Diagnosis Date  . Arthritis      "all over my body" (03/13/2013)  . Asthma    . Asymptomatic carotid artery stenosis, bilateral 10/08/2018  . GERD (gastroesophageal reflux disease)    . H/O hiatal hernia    . Heart murmur    . Hypercholesterolemia 10/08/2018  . Hypertension    . Hypothyroidism    . Myasthenia gravis ([place])      "in my eyes; diagnsosed > 7 yr ago" (03/13/2013)  . Sleep apnea      on CPAP  . Thyroid carcinoma ([place])    . Type II diabetes mellitus ([place])           Past Surgical History:  Procedure Laterality Date  . ABDOMINAL HYSTERECTOMY      . ANTERIOR CERVICAL DECOMP/DISCECTOMY FUSION        "I've had severa ORs; always went in from the front" (03/13/2013)  . APPENDECTOMY      . CARDIAC CATHETERIZATION        "several" (03/13/2013)  . CARPAL TUNNEL RELEASE Right    . CATARACT EXTRACTION W/ INTRAOCULAR LENS  IMPLANT, BILATERAL Bilateral    . CHOLECYSTECTOMY      . KNEE ARTHROSCOPY Left    . SHOULDER ARTHROSCOPY W/ ROTATOR CUFF REPAIR Left    .  TONSILLECTOMY      . TOTAL THYROIDECTOMY             Family History  Problem Relation Age of Onset  . Coronary artery disease Sister          s/p coronary stenting  . Stroke Sister 36  . Diabetes Sister    . Congestive Heart Failure Mother    . Asthma Mother    . Diabetes Mother    . Congestive Heart Failure Father    . Lung cancer Father    . Asthma Father    . Diabetes Father    . Diabetes Brother      Social History:  reports that she has never smoked. She has never used smokeless tobacco. She reports that she does not drink alcohol or use drugs. Allergies:       Allergies  Allergen Reactions  . Contrast Media [Iodinated Diagnostic Agents]  Anaphylaxis      01-10-15---PT GIVEN 13 HR PRE MEDS FOR CT--PT TOLERATED IV CONTRAST W/O ANY REACTION------[name] RT-R CT  . Fluorescein Shortness Of Breath and Other (See Comments)      (Dye)  . Iodine Anaphylaxis      Contrast dye - iodine  . Molds & Smuts Shortness Of Breath and Other (See Comments)      Congestion and wheezing, also  . Shellfish-Derived Products Anaphylaxis, Shortness Of Breath, Swelling and Other (See Comments)      Welts, also  . Codeine Hives and Nausea Only  . Other Rash and Other (See Comments)      Coban causes welts, also          Medications Prior to Admission  Medication Sig Dispense Refill  . Biotin 5000 MCG CAPS Take 5,000 mcg by mouth daily with breakfast.       . BUTRANS 10 MCG/HR PTWK patch Place 1 patch onto the skin See admin instructions. Place 1 patch onto the skin every 7 days as needed for pain      . BYDUREON BCISE 2 MG/0.85ML AUIJ Inject 2 mg into the skin every Monday.       . canagliflozin (INVOKANA) 300 MG TABS tablet Take 300 mg by mouth daily before breakfast.      . Continuous Blood Gluc Receiver (FREESTYLE LIBRE 14 DAY READER) DEVI 1 Device by Does not apply route as directed.       . Continuous Blood Gluc Sensor (FREESTYLE LIBRE 14 DAY SENSOR) MISC Inject 1 patch into the skin every 14 (fourteen) days.      . cycloSPORINE (RESTASIS) 0.05 % ophthalmic emulsion Place 1 drop into both eyes 2 (two) times daily.      . diclofenac sodium (VOLTAREN) 1 % GEL Apply 2 g topically daily as needed (to painful sites).       . diphenhydrAMINE (BENADRYL) 25 mg capsule Take 25 mg by mouth every 6 (six) hours as needed for allergies.      . diphenhydrAMINE in sodium chloride 0.9 % 50 mL Inject 50 mg into the vein as directed. 50 mg with all infusions      . esomeprazole (NEXIUM) 40 MG capsule Take 40 mg by mouth 2 (two) times daily.      Marland Kitchen ezetimibe (ZETIA) 10 MG tablet Take 1 tablet (10 mg total) by mouth daily. 90 tablet 1  . ibuprofen  (ADVIL,MOTRIN) 800 MG tablet Take 800 mg by mouth See admin instructions. Take 800 mg by mouth every 4-6 hours as needed for  pain or headaches      . Immune Globulin, Human, 40 GM/400ML SOLN Inject as directed See admin instructions. At home, for 2 days, every 6 weeks      . Insulin Disposable Pump (OMNIPOD DASH SYSTEM) KIT continuous.       . insulin lispro (HUMALOG) 100 UNIT/ML injection Inject into the skin See admin instructions. Per insulin pump (Omnipod Dash)      . levalbuterol (XOPENEX) 0.63 MG/3ML nebulizer solution Take 0.63 mg by nebulization every 6 (six) hours as needed for wheezing or shortness of breath.       . Levothyroxine Sodium 137 MCG CAPS Take 137 mcg by mouth daily before breakfast.       . meclizine (ANTIVERT) 25 MG tablet Take 25 mg by mouth 3 (three) times daily as needed for dizziness (or migraine-related nausea).       . Misc Natural Products (AIRBORNE ELDERBERRY) CHEW Chew 1 tablet by mouth daily.      . mometasone (NASONEX) 50 MCG/ACT nasal spray Place 2 sprays into the nose daily as needed (for allergies).       . montelukast (SINGULAIR) 10 MG tablet Take 10 mg by mouth every morning.      . mycophenolate (CELLCEPT) 500 MG tablet Take 1,500 mg by mouth in the morning and at bedtime.       Marland Kitchen olmesartan (BENICAR) 20 MG tablet Take 20 mg by mouth daily.      . predniSONE (DELTASONE) 10 MG tablet Take 10 mg by mouth every morning.      Marland Kitchen PRESCRIPTION MEDICATION See admin instructions. CPAP- At bedtime      . PROAIR HFA 108 (90 BASE) MCG/ACT inhaler Inhale 2 puffs into the lungs every 6 (six) hours as needed for wheezing or shortness of breath.       . riTUXimab (RITUXAN) 500 MG/50ML injection Inject 1,000 mg into the vein See admin instructions. 1,000 mg via IV, then another 1,000 mg in two weeks- repeat after 6 months: "riTUXimab (RITUXAN) 1,000 mg in sodium chloride 0.9% 1,000 mL infusion- Begin infusion at 50 mL/hr, if no hypersensitivity or infusion-related events occur,  may increase by 50 mL/hr every 30 minutes to a maximum infusion rate of 400 mL/hr"      . spironolactone (ALDACTONE) 25 MG tablet Take 25 mg by mouth daily.      . TRANSDERM-SCOP, 1.5 MG, 1 MG/3DAYS Place 1 patch onto the skin every three (3) days as needed ("for migraine relief").       . vitamin B-12 (CYANOCOBALAMIN) 1000 MCG tablet Take 1,000 mcg by mouth daily.      . Vitamin D, Ergocalciferol, (DRISDOL) 50000 UNITS CAPS capsule Take 50,000 Units by mouth every Monday.       . zolpidem (AMBIEN) 5 MG tablet Take 5 mg by mouth at bedtime as needed for sleep.       . B-D ULTRAFINE III SHORT PEN 31G X 8 MM MISC        . rosuvastatin (CRESTOR) 40 MG tablet Take 40 mg by mouth at bedtime.      . simvastatin (ZOCOR) 40 MG tablet Take 1 tablet (40 mg total) by mouth daily at 6 PM. (Patient not taking: Reported on 12/19/2019) 90 tablet 1  . SYMBICORT 160-4.5 MCG/ACT inhaler USE 2 INHALATIONS TWICE A DAY TO PREVENT COUGH OR WHEEZE. RINSE, GARGLE AND SPIT AFTER USE (Patient taking differently: Inhale 2 puffs into the lungs See admin instructions. ) 30.6 g 1  Home: Home Living Family/patient expects to be discharged to:: Private residence Living Arrangements: Alone Available Help at Discharge: Family, Available PRN/intermittently Type of Home: House Home Access: Stairs to enter [employer] of Steps: side: 2 without rail; front door: 5 without rail Entrance Stairs-Rails: None Home Layout: One level Bathroom Shower/Tub: Chiropodist: [place]: [place] - single point, [employer] - quad, Grab bars - tub/shower Additional Comments: pt reports she is unsure of AD, it was all her mothers.  Functional History: Prior Function Level of Independence: Independent Comments: Pt independent in all ADL, IADL, and mobility tasks. Pt does not ambulate with an assistive device and reports 0 falls in the last 6 months. Pt still drives and is retired.  Functional Status:    Mobility: Bed Mobility Overal bed mobility: Needs Assistance Bed Mobility: Supine to Sit Supine to sit: Supervision General bed mobility comments: pt able to come to sitting EOB without assist or VC Transfers Overall transfer level: Needs assistance Equipment used: Rolling walker (2 wheeled), None Transfers: Sit to/from Stand Sit to Stand: Min assist, Min guard General transfer comment: initially minA with RW for safety and stability, pt able to stand from recliner with minG and no AD for 5XSTS (60 sec) Ambulation/Gait Ambulation/Gait assistance: Min guard Gait Distance (Feet): 30 Feet Assistive device: Rolling walker (2 wheeled) Gait Pattern/deviations: Step-through pattern, Decreased stride length, Trunk flexed General Gait Details: pt with sig slowed gait with minimal clearance and heel-toe pattern. Pt with sig reliance on BUE, some assist/cues for imrpved grip with RUE Gait velocity interpretation: <1.8 ft/sec, indicate of risk for recurrent falls   ADL: ADL Overall ADL's : Needs assistance/impaired Eating/Feeding: Set up, Sitting Grooming: Minimal assistance, Oral care, Wash/dry hands, Wash/dry face, Standing Grooming Details (indicate cue type and reason): While standing at the sink. Assist for opening containers.  Upper Body Bathing: Set up, Supervision/ safety, Sitting Lower Body Bathing: Supervison/ safety, Min guard, Sit to/from stand Upper Body Dressing : Set up, Supervision/safety, Sitting Lower Body Dressing: Minimal assistance, Sitting/lateral leans Lower Body Dressing Details (indicate cue type and reason): To don/doff socks. Cues to incorporate use of RUE into task.  Toilet Transfer: Min guard, Ambulation, Regular Toilet, Grab bars Toileting- Water quality scientist and Hygiene: Min guard, Supervision/safety, Sit to/from stand Toileting - Clothing Manipulation Details (indicate cue type and reason): Supervision while seated, min guard while standing to complete.   Functional mobility during ADLs: Min guard, Rolling walker General ADL Comments: Pt able to ambulate to/from bathroom with RW and min guard. 0 instances of LOB, however pt unsteady on feet. Pt tolerated standing 6.5 min at the sink to complete grooming tasks.    Cognition: Cognition Overall Cognitive Status: Within Functional Limits for tasks assessed Orientation Level: Oriented X4 Cognition Arousal/Alertness: Awake/alert Behavior During Therapy: WFL for tasks assessed/performed Overall Cognitive Status: Within Functional Limits for tasks assessed General Comments: Pt pleasant and willing to participate in therapy tasks. Pt able to follow multi-step instructions without difficulty.    Blood pressure (!) 143/63, pulse (!) 103, temperature 98.3 F (36.8 C), temperature source Oral, resp. rate 19, height '5\' 3"'$  (1.6 m), weight 81.6 kg, SpO2 98 %. Physical Exam  Nursing note and vitals reviewed. Constitutional: She is oriented to person, place, and time.  Older female sitting up in bed; appropriate, sister at bedside, NAD  HENT:  Head: Normocephalic and atraumatic.  R facial droop R facial sensory loss/decreased Tongue midline  Eyes: Conjunctivae and EOM are normal. Right eye exhibits no  discharge. Left eye exhibits no discharge.  Low lids due to myasthenia gravis No nystagmus on exam- EOMI B/L  Neck: No tracheal deviation present.  Cardiovascular:  No murmur heard. RRR- no JVD  Respiratory: No stridor.  CTA B/L- good air movement B/L  GI:  Soft, NT, ND;  (+)BS  Musculoskeletal:        General: No edema. Normal range of motion.     Cervical back: Normal range of motion and neck supple.     Comments: RUE- biceps 4-/5, triceps 4-/5, WE 4/5, grip 4/5, finger abd 4-/5 LUE 5/5 in same muscles tested RLE- HF 4-/5, KE 4/5, DF 4-/5, PF 4/5 LLE- 5/5 in same muscles tested TTP over L chest- esp over ribs  Neurological: She is alert and oriented to person, place, and time.  Patient is  alert in no acute distress.  Follows commands.  Provides name and age.  Fair awareness of deficits.  Somewhat decrease in sensation of RLE; increased impairment in sensation to light touch in R arm, but R hand is almost numb completely.  No hoffman's no clonus B/L  Skin: Skin is warm and dry. No rash noted. No erythema.  Psychiatric:  Appropriate, cordial      Lab Results Last 24 Hours       Results for orders placed or performed during the hospital encounter of 12/19/19 (from the past 24 hour(s))  Respiratory Panel by RT PCR (Flu A&B, Covid) - Nasopharyngeal Swab     Status: None    Collection Time: 12/20/19  3:00 PM    Specimen: Nasopharyngeal Swab  Result Value Ref Range    SARS Coronavirus 2 by RT PCR NEGATIVE NEGATIVE    Influenza A by PCR NEGATIVE NEGATIVE    Influenza B by PCR NEGATIVE NEGATIVE  APTT     Status: None    Collection Time: 12/20/19  6:23 PM  Result Value Ref Range    aPTT 30 24 - 36 seconds  Hemoglobin A1c     Status: Abnormal    Collection Time: 12/20/19  6:23 PM  Result Value Ref Range    Hgb A1c MFr Bld 9.1 (H) 4.8 - 5.6 %    Mean Plasma Glucose 214.47 mg/dL  HIV Antibody (routine testing w rflx)     Status: None    Collection Time: 12/20/19  6:23 PM  Result Value Ref Range    HIV Screen 4th Generation wRfx Non Reactive Non Reactive  TSH     Status: None    Collection Time: 12/20/19  6:23 PM  Result Value Ref Range    TSH 0.991 0.350 - 4.500 uIU/mL  Glucose, capillary     Status: Abnormal    Collection Time: 12/20/19  9:24 PM  Result Value Ref Range    Glucose-Capillary 273 (H) 70 - 99 mg/dL    Comment 1 Notify RN      Comment 2 Document in Chart    Glucose, capillary     Status: Abnormal    Collection Time: 12/21/19  3:10 AM  Result Value Ref Range    Glucose-Capillary 202 (H) 70 - 99 mg/dL    Comment 1 Notify RN      Comment 2 Document in Chart    Glucose, capillary     Status: Abnormal    Collection Time: 12/21/19  6:05 AM  Result Value Ref  Range    Glucose-Capillary 301 (H) 70 - 99 mg/dL    Comment 1 Notify RN  Comment 2 Document in Chart    Lipid panel     Status: Abnormal    Collection Time: 12/21/19  6:18 AM  Result Value Ref Range    Cholesterol 193 0 - 200 mg/dL    Triglycerides 237 (H) <150 mg/dL    HDL 45 >40 mg/dL    Total CHOL/HDL Ratio 4.3 RATIO    VLDL 47 (H) 0 - 40 mg/dL    LDL Cholesterol 101 (H) 0 - 99 mg/dL  Glucose, capillary     Status: Abnormal    Collection Time: 12/21/19 11:26 AM  Result Value Ref Range    Glucose-Capillary 351 (H) 70 - 99 mg/dL       Imaging Results (Last 48 hours)  DG Chest 2 View   Result Date: 12/19/2019 CLINICAL DATA:  Left-sided chest pain EXAM: CHEST - 2 VIEW COMPARISON:  06/14/2017 FINDINGS: Right-sided central venous port tip over the proximal right atrium. Low lung volumes. No consolidation, pleural effusion or pneumothorax. Borderline to mild cardiomegaly. Aortic atherosclerosis. No pneumothorax. IMPRESSION: Low lung volumes. Borderline to mild cardiomegaly. Electronically Signed   By: Donavan Foil M.D.   On: 12/19/2019 17:20    MR BRAIN WO CONTRAST   Result Date: 12/20/2019 CLINICAL DATA:  Neuro deficit, acute, stroke suspected. Additional history provided: Right facial numbness and right arm weakness, known history of carotid disease. EXAM: MRI HEAD WITHOUT CONTRAST TECHNIQUE: Multiplanar, multiecho pulse sequences of the brain and surrounding structures were obtained without intravenous contrast. COMPARISON:  Report from head CT 05/24/2000 (images unavailable). FINDINGS: Brain: There is a 7 mm focus of restricted diffusion within the left thalamus consistent with acute lacunar infarct (series 5, image 30). Corresponding T2/FLAIR hyperintensity at this site. No acute infarct is identified elsewhere within the brain. No significant white matter disease for age. Cerebral volume is normal. No evidence of intracranial mass. No chronic intracranial blood products. No  extra-axial fluid collection. No midline shift. Vascular: Expected proximal arterial flow voids. Skull and upper cervical spine: No focal marrow lesion. Sequela of prior cervical spine fusion with susceptibility artifact arising from spinal fusion hardware. Adjacent level disease at C2-C3 with a posterior disc osteophyte complex and ligamentum flavum hypertrophy contributing to suspected at least mild/moderate spinal canal stenosis. Sinuses/Orbits: Visualized orbits show no acute finding. Right maxillary sinus mucous retention cyst. Mild ethmoid and left sphenoid sinus mucosal thickening. Trace fluid within the bilateral mastoid air cells. 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 Otherwise unremarkable MRI appearance of the brain for age. Paranasal sinus disease as described. Trace bilateral mastoid effusions. Sequela of prior cervical fusion. C2-C3 adjacent level disease with suspected at least mild/moderate spinal canal stenosis. Electronically Signed   By: Kellie Simmering DO   On: 12/20/2019 12:56    NM Pulmonary Perfusion   Result Date: 12/20/2019 CLINICAL DATA:  Left side chest pain, shortness of breath EXAM: NUCLEAR MEDICINE PERFUSION LUNG SCAN TECHNIQUE: Perfusion images were obtained in multiple projections after intravenous injection of radiopharmaceutical. Ventilation scans intentionally deferred if perfusion scan and chest x-ray adequate for interpretation during COVID 19 epidemic. RADIOPHARMACEUTICALS:  1.55 mCi Tc-58mMAA IV COMPARISON:  Chest x-ray 12/19/2019 FINDINGS: No perfusion defects seen to suggest pulmonary embolus. IMPRESSION: No evidence of pulmonary embolus. Electronically Signed   By: KRolm BaptiseM.D.   On: 12/20/2019 08:16    MR ANGIO CHEST W WO CONTRAST   Result Date: 12/20/2019 CLINICAL DATA:  69year old female with severe contrast allergy and acute chest pain concerning for  possible dissection. EXAM: MRA CHEST WITH OR WITHOUT CONTRAST TECHNIQUE: Angiographic  images of the chest were obtained using MRA technique with intravenous contrast. CONTRAST:  10m GADAVIST GADOBUTROL 1 MMOL/ML IV SOLN COMPARISON:  None. FINDINGS: VASCULAR Aorta: Conventional 3 vessel arch anatomy. No evidence of dissection, aneurysm or acute thoracic aortic abnormality. Mildly irregular atherosclerotic plaque present in the descending thoracic aorta. No penetrating ulceration. Signal void in the proximal left subclavian artery over approximately 2 cm may represent an intermediate segment occlusion. Heart: The heart is normal in size. However, there is significant concentric hypertrophy of the ventricular myocardium. Pulmonary Arteries:  Normal in caliber.  No large PE. Other: Right IJ approach single-lumen power injectable port catheter is present. The catheter tip terminates at the superior cavoatrial junction. NON-VASCULAR Mediastinum: No mediastinal mass or adenopathy. Lungs/pleura. No focal signal abnormality or abnormal enhancement. No evidence of pleural effusion. Upper abdomen: 1.8 cm simple cyst in the upper pole of the right kidney. No acute abnormality within the upper abdomen. Musculoskeletal: No focal signal abnormality or abnormal enhancement. IMPRESSION: VASCULAR 1. No evidence of acute aortic dissection, aortic aneurysm or other acute vascular abnormality. 2. 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 3. Heterogeneous and irregular/ulcerated atherosclerotic plaque along the descending thoracic aorta. 4. Concentric hypertrophy of the left ventricular myocardium suggests chronic systolic hypertension. NON-VASCULAR 1. No acute abnormality within the visualized upper abdomen. 2. Simple right upper pole renal cyst. Electronically Signed   By: HJacqulynn CadetM.D.   On: 12/20/2019 15:01    ECHOCARDIOGRAM COMPLETE   Result Date: 12/21/2019    ECHOCARDIOGRAM REPORT   Patient Name:   EBary RichardDate of  Exam: 12/20/2019 Medical Rec #:  [id number]      Height:       63.0 in Accession #:    [id number]     Weight:       180.0 lb Date of Birth:  [birth year]       BSA:          1.849 m Patient Age:    89years        BP:           123/70 mmHg Patient Gender: F               HR:           100 bpm. Exam Location:  Inpatient Procedure: 2D Echo Indications:     stroke 434.91  History:         Patient has prior history of Echocardiogram examinations, most                  recent 05/29/2015. Risk Factors:Hypertension, Diabetes,                  Dyslipidemia and Sleep Apnea.  Sonographer:     LDobsonReferring Phys:  [id number] Phys: JAdrian ProwsMD  Sonographer Comments: Image acquisition challenging due to respiratory motion. IMPRESSIONS  1. Left ventricular ejection fraction, by estimation, is 60 to 65%. The left ventricle has normal function. The left ventricle has no regional wall motion abnormalities. Left ventricular diastolic parameters are consistent with Grade I diastolic dysfunction (impaired relaxation).  2. Right ventricular systolic function is normal. The right ventricular size is normal.  3. Left atrial size was moderately dilated.  4. The mitral valve is grossly normal. Trivial mitral valve regurgitation. No  evidence of mitral stenosis.  5. Mild calcification of the non coronary cusp.. The aortic valve is tricuspid. Aortic valve regurgitation is not visualized. No aortic stenosis is present.  6. The inferior vena cava is normal in size with greater than 50% respiratory variability, suggesting right atrial pressure of 3 mmHg. FINDINGS  Left Ventricle: Left ventricular ejection fraction, by estimation, is 60 to 65%. The left ventricle has normal function. The left ventricle has no regional wall motion abnormalities. The left ventricular internal cavity size was small. There is no left ventricular hypertrophy. Left ventricular diastolic parameters are consistent with Grade I  diastolic dysfunction (impaired relaxation). Normal left ventricular filling pressure. Right Ventricle: The right ventricular size is normal. No increase in right ventricular wall thickness. Right ventricular systolic function is normal. Left Atrium: Left atrial size was moderately dilated. Right Atrium: Right atrial size was normal in size. Pericardium: There is no evidence of pericardial effusion. Mitral Valve: The mitral valve is grossly normal. Mild to moderate mitral annular calcification. Trivial mitral valve regurgitation. No evidence of mitral valve stenosis. Tricuspid Valve: The tricuspid valve is normal in structure. Tricuspid valve regurgitation is not demonstrated. Aortic Valve: Mild calcification of the non coronary cusp. The aortic valve is tricuspid. Aortic valve regurgitation is not visualized. No aortic stenosis is present. There is mild calcification of the aortic valve. Pulmonic Valve: The pulmonic valve was not well visualized. Pulmonic valve regurgitation is trivial. Aorta: The aortic root is normal in size and structure. Venous: The inferior vena cava is normal in size with greater than 50% respiratory variability, suggesting right atrial pressure of 3 mmHg. IAS/Shunts: No atrial level shunt detected by color flow Doppler.  LEFT VENTRICLE PLAX 2D LVIDd:         3.20 cm  Diastology LVIDs:         2.00 cm  LV e' lateral:   7.94 cm/s LV PW:         1.20 cm  LV E/e' lateral: 8.9 LV IVS:        1.00 cm  LV e' medial:    5.87 cm/s LVOT diam:     1.80 cm  LV E/e' medial:  12.1 LV SV:         63 LV SV Index:   34 LVOT Area:     2.54 cm  RIGHT VENTRICLE RV S prime:     15.70 cm/s LEFT ATRIUM             Index       RIGHT ATRIUM          Index LA diam:        2.90 cm 1.57 cm/m  RA Area:     8.67 cm LA Vol (A2C):   33.3 ml 18.01 ml/m RA Volume:   15.10 ml 8.17 ml/m LA Vol (A4C):   34.1 ml 18.44 ml/m LA Biplane Vol: 33.6 ml 18.17 ml/m  AORTIC VALVE LVOT Vmax:   132.00 cm/s LVOT Vmean:  91.800 cm/s  LVOT VTI:    0.248 m  AORTA Ao Root diam: 3.10 cm MITRAL VALVE MV Area (PHT): 4.36 cm     SHUNTS MV Decel Time: 174 msec     Systemic VTI:  0.25 m MV E velocity: 71.00 cm/s   Systemic Diam: 1.80 cm MV A velocity: 113.00 cm/s MV E/A ratio:  0.63 Adrian Prows MD Electronically signed by Adrian Prows MD Signature Date/Time: 12/21/2019/7:35:14 AM    Final     VAS [place]  LOWER EXTREMITY VENOUS (DVT) (ONLY [facility] & [facility])   Result Date: 12/20/2019  Lower Venous DVTStudy Indications: Edema.  Comparison Study: no prior Performing Technologist: June Leap RDMS, RVT  Examination Guidelines: A complete evaluation includes B-mode imaging, spectral Doppler, color Doppler, and power Doppler as needed of all accessible portions of each vessel. Bilateral testing is considered an integral part of a complete examination. Limited examinations for reoccurring indications may be performed as noted. The reflux portion of the exam is performed with the patient in reverse Trendelenburg.  +---------+---------------+---------+-----------+----------+--------------+ RIGHT    CompressibilityPhasicitySpontaneityPropertiesThrombus Aging +---------+---------------+---------+-----------+----------+--------------+ CFV      Full           Yes      Yes                                 +---------+---------------+---------+-----------+----------+--------------+ SFJ      Full                                                        +---------+---------------+---------+-----------+----------+--------------+ FV Prox  Full                                                        +---------+---------------+---------+-----------+----------+--------------+ FV Mid   Full                                                        +---------+---------------+---------+-----------+----------+--------------+ FV DistalFull                                                         +---------+---------------+---------+-----------+----------+--------------+ PFV      Full                                                        +---------+---------------+---------+-----------+----------+--------------+ POP      Full           Yes      Yes                                 +---------+---------------+---------+-----------+----------+--------------+ PTV      Full                                                        +---------+---------------+---------+-----------+----------+--------------+ PERO     Full                                                        +---------+---------------+---------+-----------+----------+--------------+   +---------+---------------+---------+-----------+----------+--------------+  LEFT     CompressibilityPhasicitySpontaneityPropertiesThrombus Aging +---------+---------------+---------+-----------+----------+--------------+ CFV      Full           Yes      Yes                                 +---------+---------------+---------+-----------+----------+--------------+ SFJ      Full                                                        +---------+---------------+---------+-----------+----------+--------------+ FV Prox  Full                                                        +---------+---------------+---------+-----------+----------+--------------+ FV Mid   Full                                                        +---------+---------------+---------+-----------+----------+--------------+ FV DistalFull                                                        +---------+---------------+---------+-----------+----------+--------------+ PFV      Full                                                        +---------+---------------+---------+-----------+----------+--------------+ POP      Full           Yes      Yes                                  +---------+---------------+---------+-----------+----------+--------------+ PTV      Full                                                        +---------+---------------+---------+-----------+----------+--------------+ PERO     Full                                                        +---------+---------------+---------+-----------+----------+--------------+     Summary: RIGHT: - There is no evidence of deep vein thrombosis in the lower extremity.  - No cystic structure found in the popliteal fossa.  LEFT: - There is no evidence of deep vein thrombosis in the lower extremity.  - No  cystic structure found in the popliteal fossa.  *See table(s) above for measurements and observations. Electronically signed by Servando Snare MD on 12/20/2019 at 8:24:18 PM.    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    1. Does the need for close, 24 hr/day medical supervision in concert with the patient's rehab needs make it unreasonable for this patient to be served in a less intensive setting? Yes 2. Co-Morbidities requiring supervision/potential complications: myasthenia gravis, DM- A1c 9.1, HTN, previous thyroid CA, , OSA, CPAP 3. Due to bladder management, bowel management, skin/wound care, disease management, medication administration, pain management and patient education, does the patient require 24 hr/day rehab nursing? Yes 4. Does the patient require coordinated care of a physician, rehab nurse, therapy disciplines of PT, OT to address physical and functional deficits in the context of the above medical diagnosis(es)? Yes Addressing deficits in the following areas: balance, endurance, locomotion, strength, transferring, bathing, dressing, feeding, grooming and toileting 5. Can the patient actively participate in an intensive therapy program of at least 3 hrs of therapy per day at  least 5 days per week? Yes 6. The potential for patient to make measurable gains while on inpatient rehab is excellent 7. Anticipated functional outcomes upon discharge from inpatient rehab are modified independent  with PT, modified independent with OT, n/a with SLP. 8. Estimated rehab length of stay to reach the above functional goals is: 10-14 days 9. Anticipated discharge destination: Home 10. Overall Rehab/Functional Prognosis: excellent   RECOMMENDATIONS: This patient's condition is appropriate for continued rehabilitative care in the following setting: CIR Patient has agreed to participate in recommended program. Potentially Note that insurance prior authorization may be required for reimbursement for recommended care.   Comment:  1. Pt has R hemiparesis due to L thalamic CVA with sensory loss in R side, esp R hand and leg 2. Pt reports has 7 siblings, but "they all work" so no one can help except intermittent assistance.  3. Currently is min assist walking short distances with RW -max 30 ft.  4. Might suggest lidoderm patch for L chest pain- 12 hrs on;12 hrs off- 5. Pt is very appropriate for CIR- with goals of Mod I at 10-14 days.  6. Medical issues per primary team.  7. Thank you for this consult.          Lavon Paganini Angiulli, PA-C 12/21/2019      I have personally performed a face to face diagnostic evaluation of this patient and formulated the key components of the plan.  Additionally, I have personally reviewed laboratory data, imaging studies, as well as relevant notes and concur with the physician assistant's documentation above.              Revision History                     Routing History

## 2019-12-28 ENCOUNTER — Inpatient Hospital Stay (HOSPITAL_COMMUNITY): Payer: Medicare Other | Admitting: Physical Therapy

## 2019-12-28 ENCOUNTER — Ambulatory Visit: Payer: Medicare Other | Admitting: Cardiology

## 2019-12-28 ENCOUNTER — Inpatient Hospital Stay (HOSPITAL_COMMUNITY): Payer: Medicare Other | Admitting: Occupational Therapy

## 2019-12-28 DIAGNOSIS — I639 Cerebral infarction, unspecified: Secondary | ICD-10-CM

## 2019-12-28 LAB — GLUCOSE, CAPILLARY
Glucose-Capillary: 152 mg/dL — ABNORMAL HIGH (ref 70–99)
Glucose-Capillary: 362 mg/dL — ABNORMAL HIGH (ref 70–99)
Glucose-Capillary: 341 mg/dL — ABNORMAL HIGH (ref 70–99)
Glucose-Capillary: 172 mg/dL — ABNORMAL HIGH (ref 70–99)
Glucose-Capillary: 148 mg/dL — ABNORMAL HIGH (ref 70–99)

## 2019-12-28 LAB — CBC WITH DIFFERENTIAL/PLATELET
Abs Immature Granulocytes: 0.02 10*3/uL (ref 0.00–0.07)
Basophils Absolute: 0 10*3/uL (ref 0.0–0.1)
Basophils Relative: 1 %
Eosinophils Absolute: 0.1 10*3/uL (ref 0.0–0.5)
Eosinophils Relative: 2 %
HCT: 31.7 % — ABNORMAL LOW (ref 36.0–46.0)
Hemoglobin: 10 g/dL — ABNORMAL LOW (ref 12.0–15.0)
Immature Granulocytes: 0 %
Lymphocytes Relative: 14 %
Lymphs Abs: 0.9 10*3/uL (ref 0.7–4.0)
MCH: 29.9 pg (ref 26.0–34.0)
MCHC: 31.5 g/dL (ref 30.0–36.0)
MCV: 94.6 fL (ref 80.0–100.0)
Monocytes Absolute: 0.4 10*3/uL (ref 0.1–1.0)
Monocytes Relative: 6 %
Neutro Abs: 5.1 10*3/uL (ref 1.7–7.7)
Neutrophils Relative %: 77 %
Platelets: 254 10*3/uL (ref 150–400)
RBC: 3.35 MIL/uL — ABNORMAL LOW (ref 3.87–5.11)
RDW: 12.5 % (ref 11.5–15.5)
WBC: 6.6 10*3/uL (ref 4.0–10.5)
nRBC: 0 % (ref 0.0–0.2)

## 2019-12-28 LAB — COMPREHENSIVE METABOLIC PANEL
ALT: 33 U/L (ref 0–44)
AST: 38 U/L (ref 15–41)
Albumin: 3 g/dL — ABNORMAL LOW (ref 3.5–5.0)
Alkaline Phosphatase: 89 U/L (ref 38–126)
Anion gap: 7 (ref 5–15)
BUN: 20 mg/dL (ref 8–23)
CO2: 21 mmol/L — ABNORMAL LOW (ref 22–32)
Calcium: 9 mg/dL (ref 8.9–10.3)
Chloride: 109 mmol/L (ref 98–111)
Creatinine, Ser: 0.83 mg/dL (ref 0.44–1.00)
GFR calc Af Amer: >60 mL/min (ref >60)
GFR calc non Af Amer: >60 mL/min (ref >60)
Glucose, Bld: 308 mg/dL — ABNORMAL HIGH (ref 70–99)
Potassium: 3.8 mmol/L (ref 3.5–5.1)
Sodium: 137 mmol/L (ref 135–145)
Total Bilirubin: 0.4 mg/dL (ref 0.3–1.2)
Total Protein: 6.2 g/dL — ABNORMAL LOW (ref 6.5–8.1)

## 2019-12-28 MED ORDER — CHLORHEXIDINE GLUCONATE CLOTH 2 % EX PADS
6.0000 | MEDICATED_PAD | Freq: Every day | CUTANEOUS | Status: DC
  Administered 2019-12-28: 6 via TOPICAL

## 2019-12-28 NOTE — Evaluation (Signed)
Physical Therapy Assessment and Plan  Patient Details  Name: [name] MRN: [id number] Date of Birth: [birth year]  PT Diagnosis: Abnormal posture, Abnormality of gait, Coordination disorder, Hemiplegia dominant and Muscle weakness Rehab Potential: Good ELOS: 9-13 days   Today's Date: 12/28/2019 PT Individual Time: 1005-1100 PT Individual Time Calculation (min): 55 min    Problem List:  Patient Active Problem List   Diagnosis Date Noted  . 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[place]) 12/27/2019  . Essential hypertension   . Uncomplicated asthma   . Uncontrolled type 2 diabetes mellitus with hyperglycemia ([place])   . Thalamic stroke ([place]) 12/20/2019  . Asymptomatic carotid artery stenosis, bilateral 10/08/2018  . Hypercholesterolemia 10/08/2018  . Headache around the eyes 11/12/2016  . Cervicalgia of occipito-atlanto-axial region 11/12/2016  . Moderate persistent asthma 07/29/2015  . Current use of beta blocker 07/29/2015  . Gastroesophageal reflux disease without esophagitis 07/29/2015  . OSA on CPAP 09/19/2014  . Diabetic neuropathy with neurologic complication ([place]) 95/62/1308  . Myasthenia gravis in remission ([place]) 09/19/2014  . Dyspnea 08/13/2014  . 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 Cancer of thyroid ([place]) 07/27/2014  . OSA (obstructive sleep apnea) 07/24/2014  . DM (diabetes mellitus) type II uncontrolled with eye manifestation ([place]) 06/01/2014  . 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 Nocturia more than twice per night 06/01/2014  . Snoring 06/01/2014  . Insomnia due to medical condition 06/01/2014  . Neuroma 01/16/2014  . Pseudoclaudication 11/14/2013  . Heart palpitations 06/30/2013  . Sinus tachycardia 03/29/2013  . Myasthenia gravis ([place]) 03/13/2013  . Essential hypertension, benign 03/13/2013  . Asthma, chronic 03/13/2013    Past Medical History:  Past Medical History:  Diagnosis Date  . Arthritis    "all over my body" (03/13/2013)  . Asthma   .  Asymptomatic carotid artery stenosis, bilateral 10/08/2018  . GERD (gastroesophageal reflux disease)   . H/O hiatal hernia   . Heart murmur   . Hypercholesterolemia 10/08/2018  . Hypertension   . Hypothyroidism   . Myasthenia gravis ([place])    "in my eyes; diagnsosed > 7 yr ago" (03/13/2013)  . Sleep apnea    on CPAP  . Thyroid carcinoma ([place])   . Type II diabetes mellitus ([place])    Past Surgical History:  Past Surgical History:  Procedure Laterality Date  . ABDOMINAL HYSTERECTOMY    . ANTERIOR CERVICAL DECOMP/DISCECTOMY FUSION     "I've had severa ORs; always went in from the front" (03/13/2013)  . APPENDECTOMY    . CARDIAC CATHETERIZATION     "several" (03/13/2013)  . CARPAL TUNNEL RELEASE Right   . CATARACT EXTRACTION W/ INTRAOCULAR LENS  IMPLANT, BILATERAL Bilateral   . CHOLECYSTECTOMY    . KNEE ARTHROSCOPY Left   . SHOULDER ARTHROSCOPY W/ ROTATOR CUFF REPAIR Left   . TONSILLECTOMY    . TOTAL THYROIDECTOMY      Assessment & Plan Clinical Impression: Patient is a 69 year old right-handed female history of diabetes mellitus, ACDF 2014, chronic pain syndrome, myasthenia gravis maintained on CellCept as well as chronic prednisone and [facility], asthma /OSA on CPAP, thyroid cancer, hypertension, hyperlipidemia. History taken from chart review and patient. Patient lives alone independent prior to admission and driving.  1 level home.  She does have family in the area that worked during the day.  She presented on 12/20/19 with right hemiparesis.  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  Nuclear medicine perfusion scan no pulmonary embolus.  Patient with  recent carotid Doppler noting right ICA greater than 70% and left ICA 50-69% stenosis.  Echocardiogram with ejection fraction of 16%, grade 1 diastolic dysfunction.  Admission chemistries potassium 3.3, troponin negative, BNP 215.    Patient transferred to CIR on 12/27/2019 .   Patient currently requires mod  with mobility secondary to muscle weakness, muscle joint tightness and muscle paralysis, decreased cardiorespiratoy endurance, impaired timing and sequencing, abnormal tone, unbalanced muscle activation and decreased coordination and decreased standing balance, decreased postural control, hemiplegia and decreased balance strategies.  Prior to hospitalization, patient was independent  with mobility and lived with   in a House home.  Home access is side: 2 without rail; front door: 5 without railStairs to enter.  Patient will benefit from skilled PT intervention to maximize safe functional mobility, minimize fall risk and decrease caregiver burden for planned discharge home with intermittent assist.  Anticipate patient will benefit from follow up [facility] at discharge.  PT - End of Session Activity Tolerance: Tolerates 10 - 20 min activity with multiple rests Endurance Deficit: Yes PT Assessment Rehab Potential (ACUTE/IP ONLY): Good PT Barriers to Discharge: [place] home environment;Decreased caregiver support;Medical stability;Home environment access/layout;Lack of/limited family support;Insurance for SNF coverage PT Patient demonstrates impairments in the following area(s): Balance;Edema;Endurance;Motor;Perception;Pain;Safety;Sensory PT Transfers Functional Problem(s): Bed Mobility;Bed to Chair;Car;Furniture;Floor PT Locomotion Functional Problem(s): Ambulation;Wheelchair Mobility;Stairs PT Plan PT Intensity: Minimum of 1-2 x/day ,45 to 90 minutes PT Frequency: 5 out of 7 days PT Duration Estimated Length of Stay: 9-13 days PT Treatment/Interventions: Ambulation/gait training;Discharge planning;Functional mobility training;Psychosocial support;Therapeutic Activities;Visual/perceptual remediation/compensation;Wheelchair propulsion/positioning;Therapeutic Exercise;Skin care/wound management;Neuromuscular re-education;Disease management/prevention;Balance/vestibular training;Cognitive  remediation/compensation;DME/adaptive equipment instruction;Pain management;Splinting/orthotics;UE/LE Strength taining/ROM;Stair training;Patient/family education;Functional electrical stimulation;Community reintegration;UE/LE Coordination activities PT Transfers Anticipated Outcome(s): Mod I with LRAD PT Locomotion Anticipated Outcome(s): Ambulatory with LRAD at mod I level in home PT Recommendation Recommendations for Other Services: Therapeutic Recreation consult Therapeutic Recreation Interventions: Clinical cytogeneticist;Outing/community reintergration Follow Up Recommendations: Home health PT Patient destination: Home Equipment Recommended: Rolling walker with 5" wheels;Cane;To be determined  Skilled Therapeutic Intervention Pt received sitting in WC and agreeable to PT. PT instructed patient in PT Evaluation and initiated treatment intervention; see below for results. PT educated patient in [place], rehab potential, rehab goals, and discharge recommendations. Gait training without AD x 62f and mod assist as listed for safety with significant foot drag on the R. Gait training then performed with RW x 1079fand min assist, moderate cues for posture and step length/height on the R. WC mobility x 5041fith mod assist as listed. Car transfers performed with mod assist for safety and no AD. PT instructed pt in TUG: 45sec (average of 3 trials; >13.5 sec indicates increased fall risk)  Pt performed 5xSTS: 30 sec (>15 sec indicates increased fall risk). Pt unable to to perform stairs without UE support, but attempted with BUE support and mod assist for safety and RLE placement. Pt then reports need for urination. Able to perform toilet transfer with min assist and RW, then manage clothes and pericare with supervision assist. Patient returned to room and left sitting in recliner with call bell in reach and all needs met.       PT Evaluation Precautions/Restrictions   fall.  General   Vital  Signs Pain   denies Home Living/Prior Functioning Home Living Living Arrangements: Alone Available Help at Discharge: Family;Available PRN/intermittently Type of Home: House Home Access: Stairs to enter [employer] Steps: side: 2 without rail; front door: 5 without rail Entrance Stairs-Rails: None Home Layout: One  level Bathroom Shower/Tub: Chiropodist: Standard Prior Function Level of Independence: Independent with basic ADLs  Able to Take Stairs?: Yes Driving: Yes Comments: Pt independent in all ADL, IADL, and mobility tasks. Pt does not ambulate with an assistive device and reports 0 falls in the last 6 months. Pt still drives and is retired.  Vision/Perception  Vision - Assessment Tracking/Visual Pursuits: Decreased smoothness of eye movement to RIGHT inferior field Convergence: Impaired (comment) Perception Perception: Within Functional Limits Praxis Praxis: Intact  Cognition Overall Cognitive Status: Within Functional Limits for tasks assessed Orientation Level: Oriented X4 Awareness: Appears intact Problem Solving: Appears intact Reasoning: Appears intact Safety/Judgment: Appears intact Sensation Sensation Light Touch: Impaired Detail Central sensation comments: parathesia in the upper RLE, absent in the distal RLE Proprioception: Appears Intact Coordination Gross Motor Movements are Fluid and Coordinated: No Fine Motor Movements are Fluid and Coordinated: No Coordination and Movement Description: R side hemiplegia limiting smoothness of movement Heel Shin Test: unable to perform the RLE Motor  Motor Motor: Hemiplegia;Abnormal postural alignment and control Motor - Skilled Clinical Observations: R side Hemiplegia.  Mobility Bed Mobility Bed Mobility: Rolling Right;Rolling Left;Sit to Supine;Supine to Sit Rolling Right: Minimal Assistance - Patient > 75% Rolling Left: Minimal Assistance - Patient > 75% Supine to Sit: Minimal  Assistance - Patient > 75% Sit to Supine: Minimal Assistance - Patient > 75% Transfers Transfers: Sit to Stand;Stand Pivot Transfers Sit to Stand: Minimal Assistance - Patient > 75% Stand Pivot Transfers: Moderate Assistance - Patient 50 - 74% Transfer (Assistive device): 1 person hand held assist Locomotion  Gait Ambulation: Yes Gait Assistance: Moderate Assistance - Patient 50-74% Gait Distance (Feet): 15 Feet Assistive device: None;1 person hand held assist Gait Gait: Yes Gait Pattern: Impaired Gait Pattern: Decreased step length - right;Poor foot clearance - right;Trunk flexed Stairs / Additional Locomotion Stairs: No Wheelchair Mobility Wheelchair Mobility: Yes Wheelchair Assistance: Moderate Assistance - Patient 50 - 74% Wheelchair Propulsion: Both upper extremities Wheelchair Parts Management: Needs assistance Distance: 50  Trunk/Postural Assessment  Cervical Assessment Cervical Assessment: Within Functional Limits Thoracic Assessment Thoracic Assessment: Within Functional Limits Lumbar Assessment Lumbar Assessment: Exceptions to [employer] [employer] Postural Control Postural Control: Within Functional Limits  Balance Balance Balance Assessed: Yes Standardized Balance Assessment Standardized Balance Assessment: Timed Up and Go Test Timed Up and Go Test TUG: Normal TUG Normal TUG (seconds): 45 Static Sitting Balance Static Sitting - Level of Assistance: 6: Modified independent (Device/Increase time) Dynamic Sitting Balance Dynamic Sitting - Level of Assistance: 5: Stand by assistance Static Standing Balance Static Standing - Balance Support: No upper extremity supported Static Standing - Level of Assistance: 4: Min assist Dynamic Standing Balance Dynamic Standing - Balance Support: No upper extremity supported Dynamic Standing - Level of Assistance: 3: Mod assist Extremity Assessment      RLE Assessment RLE Assessment: Exceptions to [facility] General Strength Comments: grossly  4-/5 proximal to distal LLE Assessment LLE Assessment: Within Functional Limits General Strength Comments: grossly 4+/5 proximal to distal    Refer to Care Plan for Long Term Goals  Recommendations for other services: Therapeutic Recreation  Kitchen group, Stress management and Outing/community reintegration  Discharge Criteria: Patient will be discharged from PT if patient refuses treatment 3 consecutive times without medical reason, if treatment goals not met, if there is a change in medical status, if patient makes no progress towards goals or if patient is discharged from hospital.  The above assessment, treatment plan, treatment alternatives and goals were discussed and mutually agreed  upon: by patient  [name] 12/28/2019, 1:07 PM

## 2019-12-28 NOTE — Care Management (Signed)
[facility] Individual Statement of Services  Patient Name:  [name]  Date:  12/28/2019  Welcome to the [facility].  Our goal is to provide you with an individualized program based on your diagnosis and situation, designed to meet your specific needs.  With this comprehensive rehabilitation program, you will be expected to participate in at least 3 hours of rehabilitation therapies Monday-Friday, with modified therapy programming on the weekends.  Your rehabilitation program will include the following services:  Physical Therapy (PT), Occupational Therapy (OT), Speech Therapy (ST), 24 hour per day rehabilitation nursing, Neuropsychology, Case Management (Social Worker), Rehabilitation Medicine, Nutrition Services, Pharmacy Services and Other  Weekly team conferences will be held on Wednesdays to discuss your progress.  Your Social Worker will talk with you frequently to get your input and to update you on team discussions.  Team conferences with you and your family in attendance may also be held.  Expected length of stay: 7-14 Days  Overall anticipated outcome: Supervision to MOD I  Depending on your progress and recovery, your program may change. Your Social Worker will coordinate services and will keep you informed of any changes. Your Social Worker's name and contact numbers are listed  below.  The following services may also be recommended but are not provided by the [facility]:    [place] will be made to provide these services after discharge if needed.  Arrangements include referral to agencies that provide these services.  Your insurance has been verified to be:  [employer] Your primary doctor is:  [name], DO  Pertinent information will be shared with your doctor and your insurance company.  Social Worker:  Erlene Quan, [place] or ([phone]   Information discussed with and copy given to patient by: Dyanne Iha, 12/28/2019, 1:27 PM

## 2019-12-28 NOTE — Evaluation (Signed)
Occupational Therapy Assessment and Plan  Patient Details  Name: [name] MRN: [id number] Date of Birth: 02-Sep-1950  OT Diagnosis: abnormal posture, hemiplegia affecting dominant side, muscle weakness (generalized) and coordination disorder Rehab Potential: Rehab Potential (ACUTE ONLY): Good ELOS: 10- 14 days   Today's Date: 12/28/2019 OT Individual Time: 4098-1191 OT Individual Time Calculation (min): 72 min     Problem List:  Patient Active Problem List   Diagnosis Date Noted  . 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[place]) 12/27/2019  . Essential hypertension   . Uncomplicated asthma   . Uncontrolled type 2 diabetes mellitus with hyperglycemia ([place])   . Thalamic stroke ([place]) 12/20/2019  . Asymptomatic carotid artery stenosis, bilateral 10/08/2018  . Hypercholesterolemia 10/08/2018  . Headache around the eyes 11/12/2016  . Cervicalgia of occipito-atlanto-axial region 11/12/2016  . Moderate persistent asthma 07/29/2015  . Current use of beta blocker 07/29/2015  . Gastroesophageal reflux disease without esophagitis 07/29/2015  . OSA on CPAP 09/19/2014  . Diabetic neuropathy with neurologic complication ([place]) [id number]  . Myasthenia gravis in remission ([place]) 09/19/2014  . Dyspnea 08/13/2014  . Erb-Goldflam disease ([place]) 07/27/2014  . Cancer of thyroid ([place]) 07/27/2014  . OSA (obstructive sleep apnea) 07/24/2014  . DM (diabetes mellitus) type II uncontrolled with eye manifestation ([place]) 06/01/2014  . 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 Nocturia more than twice per night 06/01/2014  . Snoring 06/01/2014  . Insomnia due to medical condition 06/01/2014  . Neuroma 01/16/2014  . Pseudoclaudication 11/14/2013  . Heart palpitations 06/30/2013  . Sinus tachycardia 03/29/2013  . Myasthenia gravis ([place]) 03/13/2013  . Essential hypertension, benign 03/13/2013  . Asthma, chronic 03/13/2013    Past Medical History:  Past Medical History:  Diagnosis Date  . Arthritis    "all over  my body" (03/13/2013)  . Asthma   . Asymptomatic carotid artery stenosis, bilateral 10/08/2018  . GERD (gastroesophageal reflux disease)   . H/O hiatal hernia   . Heart murmur   . Hypercholesterolemia 10/08/2018  . Hypertension   . Hypothyroidism   . Myasthenia gravis ([place])    "in my eyes; diagnsosed > 7 yr ago" (03/13/2013)  . Sleep apnea    on CPAP  . Thyroid carcinoma ([place])   . Type II diabetes mellitus ([place])    Past Surgical History:  Past Surgical History:  Procedure Laterality Date  . ABDOMINAL HYSTERECTOMY    . ANTERIOR CERVICAL DECOMP/DISCECTOMY FUSION     "I've had severa ORs; always went in from the front" (03/13/2013)  . APPENDECTOMY    . CARDIAC CATHETERIZATION     "several" (03/13/2013)  . CARPAL TUNNEL RELEASE Right   . CATARACT EXTRACTION W/ INTRAOCULAR LENS  IMPLANT, BILATERAL Bilateral   . CHOLECYSTECTOMY    . KNEE ARTHROSCOPY Left   . SHOULDER ARTHROSCOPY W/ ROTATOR CUFF REPAIR Left   . TONSILLECTOMY    . TOTAL THYROIDECTOMY      Assessment & Plan Clinical Impression: Patient is a 69 y.o. year old female with history of diabetes mellitus, ACDF 2014, chronic pain syndrome, myasthenia gravis maintained on CellCept as well as chronic prednisone and receives [facility]IG at [facility], asthma /OSA on CPAP, thyroid cancer, hypertension, hyperlipidemia. History taken from chart review and patient. Patient lives alone independent prior to admission and driving.  1 level home.  She does have family in the area that worked during the day.  She presented on 12/20/19 with right hemiparesis.  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  Nuclear  medicine perfusion scan no pulmonary embolus.  Patient with recent carotid Doppler noting right ICA greater than 70% and left ICA 50-69% stenosis.  Echocardiogram with ejection fraction of 05%, grade 1 diastolic dysfunction.  Admission chemistries potassium 3.3, troponin negative, BNP 215.  Currently maintained on aspirin and Plavix  for CVA prophylaxis.  Subcutaneous Lovenox for DVT prophylaxis.  Vascular surgery Dr. Donzetta Matters follow-up in regards to carotid stenosis no current plan for surgical intervention.  She is tolerating a regular diet. .  Patient transferred to CIR on 12/27/2019 .    Patient currently requires min - max A with basic self-care skills and IADL secondary to muscle weakness, decreased cardiorespiratoy endurance, decreased coordination and decreased sitting balance, decreased standing balance, decreased postural control, hemiplegia and decreased balance strategies.  Prior to hospitalization, patient could complete ADls and IADLs with independent .  Patient will benefit from skilled intervention to decrease level of assist with basic self-care skills prior to discharge home with care partner.  Anticipate patient will require intermittent supervision and follow up home health.  OT - End of Session Activity Tolerance: Decreased this session Endurance Deficit: Yes Endurance Deficit Description: multiple rest breaks with self care task OT Assessment Rehab Potential (ACUTE ONLY): Good OT Barriers to Discharge: Decreased caregiver support OT Patient demonstrates impairments in the following area(s): Balance;Endurance;Perception;Safety;Sensory OT Basic ADL's Functional Problem(s): Eating;Grooming;Bathing;Dressing;Toileting OT Advanced ADL's Functional Problem(s): Simple Meal Preparation;Laundry;Light Housekeeping OT Transfers Functional Problem(s): Tub/Shower;Toilet OT Additional Impairment(s): Fuctional Use of Upper Extremity OT Plan OT Intensity: Minimum of 1-2 x/day, 45 to 90 minutes OT Frequency: 5 out of 7 days OT Duration/Estimated Length of Stay: 10- 14 days OT Treatment/Interventions: Balance/vestibular training;DME/adaptive equipment instruction;Patient/family education;Therapeutic Activities;Psychosocial support;Therapeutic Exercise;Community reintegration;Functional mobility training;Self Care/advanced ADL  retraining;UE/LE Strength taining/ROM;Discharge planning;UE/LE Coordination activities;Neuromuscular re-education;Visual/perceptual remediation/compensation OT Self Feeding Anticipated Outcome(s): mod I OT Basic Self-Care Anticipated Outcome(s): mod I - S OT Toileting Anticipated Outcome(s): mod I OT Bathroom Transfers Anticipated Outcome(s): mod I toileting and supervision for shower transfer OT Recommendation Recommendations for Other Services: Neuropsych consult Patient destination: Home Follow Up Recommendations: Home health OT;Other (comment)(intermittent supervision) Equipment Recommended: To be determined   Skilled Therapeutic Intervention  Upon entering the room, pt supine in bed with no c/o pain and agreeable to OT intervention. Pt performed bed mobility with min A to EOB. OT educated pt on OT purpose, POC, and goals with pt verbalizing agreement and understanding. Pt standing with mod A and ambulating with mod hand held assistance into bathroom and onto shower chair. OT covered port for safety. Pt bathing while seated with one stand assisted by therapist to wash buttocks. Pt reports feeling very fatigued during session and needing multiple rest breaks. Pt ambulating with RW and min - mod A 10 ' to sit on EOB for dressing tasks. Pt needing cuing for hemiplegic dressing technique with mod A for UB dressing and max A for LB dressing this session. OT provided total A to don B TED hose this session as well. Pt sitting in recliner chair at end of session with chair alarm activated and call bell within reach.   OT Evaluation Precautions/Restrictions  Precautions Precautions: Fall General   Vital Signs Therapy Vitals Temp: 97.9 F (36.6 C) Pulse Rate: 99 Resp: 17 BP: 116/72 Patient Position (if appropriate): Sitting Oxygen Therapy SpO2: 97 % O2 Device: Room Air Pain Pain Assessment Pain Scale: 0-10 Pain Score: 0-No pain Home Living/Prior Functioning Home Living Living  Arrangements: Alone Available Help at Discharge: Family, Available PRN/intermittently Type  of Home: House Home Access: Stairs to enter [employer] of Steps: side: 2 without rail; front door: 5 without rail Entrance Stairs-Rails: None Home Layout: One level Bathroom Shower/Tub: Chiropodist: Standard Additional Comments: pt reports she is unsure of AD, it was all her mothers.  Lives With: Alone Prior Function Level of Independence: Independent with basic ADLs  Able to Take Stairs?: Yes Driving: Yes Vocation: Retired Comments: Pt independent in all ADL, IADL, and mobility tasks. Pt does not ambulate with an assistive device and reports 0 falls in the last 6 months. Pt still drives and is retired.  Vision Baseline Vision/History: Wears glasses Wears Glasses: At all times Patient Visual Report: Diplopia Vision Assessment?: Vision impaired- to be further tested in functional context Tracking/Visual Pursuits: Decreased smoothness of eye movement to RIGHT inferior field Convergence: Impaired (comment) Perception  Perception: Within Functional Limits Praxis Praxis: Intact Cognition Overall Cognitive Status: Within Functional Limits for tasks assessed Arousal/Alertness: Awake/alert Orientation Level: Person;Place;Situation Person: Oriented Place: Oriented Situation: Oriented Year: 2021 Month: May Day of Week: Correct Memory: Appears intact Immediate Memory Recall: Sock;Blue;Bed Memory Recall Sock: Without Cue Memory Recall Blue: Without Cue Memory Recall Bed: Without Cue Awareness: Appears intact Problem Solving: Appears intact Reasoning: Appears intact Safety/Judgment: Appears intact Sensation Sensation Light Touch: Impaired Detail Central sensation comments: parathesia in the upper RLE, absent in the distal RLE Hot/Cold: Impaired Detail Hot/Cold Impaired Details: Impaired RUE;Impaired RLE Proprioception: Appears Intact Stereognosis: Not  tested Coordination Gross Motor Movements are Fluid and Coordinated: No Fine Motor Movements are Fluid and Coordinated: No Coordination and Movement Description: R side hemiplegia limiting smoothness of movement Heel Shin Test: unable to perform the RLE Motor  Motor Motor: Hemiplegia;Abnormal postural alignment and control Motor - Skilled Clinical Observations: R side Hemiplegia. Mobility  Bed Mobility Bed Mobility: Rolling Right;Rolling Left;Sit to Supine;Supine to Sit Rolling Right: Minimal Assistance - Patient > 75% Rolling Left: Minimal Assistance - Patient > 75% Supine to Sit: Minimal Assistance - Patient > 75% Sit to Supine: Minimal Assistance - Patient > 75% Transfers Sit to Stand: Minimal Assistance - Patient > 75%  Trunk/Postural Assessment  Cervical Assessment Cervical Assessment: Within Functional Limits Thoracic Assessment Thoracic Assessment: Within Functional Limits Lumbar Assessment Lumbar Assessment: Exceptions to [facility] Postural Control Postural Control: Within Functional Limits  Balance Balance Balance Assessed: Yes Standardized Balance Assessment Standardized Balance Assessment: Timed Up and Go Test Timed Up and Go Test TUG: Normal TUG Normal TUG (seconds): 45 Static Sitting Balance Static Sitting - Level of Assistance: 6: Modified independent (Device/Increase time) Dynamic Sitting Balance Dynamic Sitting - Level of Assistance: 5: Stand by assistance Static Standing Balance Static Standing - Balance Support: No upper extremity supported Static Standing - Level of Assistance: 4: Min assist Dynamic Standing Balance Dynamic Standing - Balance Support: No upper extremity supported Dynamic Standing - Level of Assistance: 3: Mod assist Extremity/Trunk Assessment RUE Assessment RUE Assessment: Exceptions to [facility] Passive Range of Motion (PROM) Comments: WFLs Active Range of Motion (AROM) Comments: edema noted with limits AROM as well. shoulder elevation  ~90 General Strength Comments: 3-/5 throughout LUE Assessment LUE Assessment: Within Functional Limits     Refer to Care Plan for Long Term Goals  Recommendations for other services: Neuropsych   Discharge Criteria: Patient will be discharged from OT if patient refuses treatment 3 consecutive times without medical reason, if treatment goals not met, if there is a change in medical status, if patient makes no progress towards goals or if patient is  discharged from hospital.  The above assessment, treatment plan, treatment alternatives and goals were discussed and mutually agreed upon: by patient  Gypsy Decant 12/28/2019, 1:52 PM

## 2019-12-28 NOTE — IPOC Note (Signed)
Overall Plan of Care [facility]) Patient Details Name: [name] MRN: [id number] DOB: 1950-09-17  Admitting Diagnosis: Thalamic stroke [facility]  Hospital Problems: Principal Problem:   Thalamic stroke [facility]) Active Problems:   Left thalamic infarction [facility])     Functional Problem List: Nursing Endurance, Medication Management, Motor, Pain, Perception, Safety, Sensory  PT Balance, Edema, Endurance, Motor, Perception, Pain, Safety, Sensory  OT Balance, Endurance, Perception, Safety, Sensory  SLP    TR         Basic ADL's: OT Eating, Grooming, Bathing, Dressing, Toileting     Advanced  ADL's: OT Simple Meal Preparation, Laundry, Light Housekeeping     Transfers: PT Bed Mobility, Bed to Chair, Car, [employer], Editor, commissioning, Agricultural engineer: PT Ambulation, Emergency planning/management officer, Stairs     Additional Impairments: OT Fuctional Use of Upper Extremity  SLP        TR      Anticipated Outcomes Item Anticipated Outcome  Self Feeding mod I  Swallowing      Basic self-care  mod I - S  Toileting  mod I   Bathroom Transfers mod I toileting and supervision for shower transfer  Bowel/Bladder  Patient will remain continent with Min I assist  Transfers  Mod I with LRAD  Locomotion  Ambulatory with LRAD at mod I level in home  Communication     Cognition     Pain  pain goal of 3 for pain scale of 0-10  Safety/Judgment  Patient will remain free of fall or injury during stay on rehab   Therapy Plan: PT Intensity: Minimum of 1-2 x/day ,45 to 90 minutes PT Frequency: 5 out of 7 days PT Duration Estimated Length of Stay: 9-13 days OT Intensity: Minimum of 1-2 x/day, 45 to 90 minutes OT Frequency: 5 out of 7 days OT Duration/Estimated Length of Stay: 10- 14 days     Due to the current state of emergency, patients may not be receiving their 3-hours of Medicare-mandated therapy.   Team Interventions: Nursing Interventions Patient/Family Education, Disease  Management/Prevention, Discharge Planning, Pain Management, Cognitive Remediation/Compensation, Medication Management, Psychosocial Support  PT interventions Ambulation/gait training, Discharge planning, Functional mobility training, Psychosocial support, Therapeutic Activities, Visual/perceptual remediation/compensation, Wheelchair propulsion/positioning, Therapeutic Exercise, Skin care/wound management, Neuromuscular re-education, Disease management/prevention, Training and development officer, Cognitive remediation/compensation, DME/adaptive equipment instruction, Pain management, Splinting/orthotics, UE/LE Strength taining/ROM, Stair training, Patient/family education, Functional electrical stimulation, Community reintegration, UE/LE Coordination activities  OT Interventions Training and development officer, Engineer, drilling, Patient/family education, Therapeutic Activities, Psychosocial support, Therapeutic Exercise, Community reintegration, Functional mobility training, Self Care/advanced ADL retraining, UE/LE Strength taining/ROM, Discharge planning, UE/LE Coordination activities, Neuromuscular re-education, Visual/perceptual remediation/compensation  SLP Interventions    TR Interventions    SW/CM Interventions Discharge Planning, Psychosocial Support, Patient/Family Education   Barriers to Discharge MD  Medical stability and has MG with easy fatigue  Nursing      PT Inaccessible home environment, Decreased caregiver support, Medical stability, Home environment access/layout, Lack of/limited family support, Insurance for SNF coverage    OT Decreased caregiver support    SLP      SW       Team Discharge Planning: Destination: PT-Home ,OT- Home , SLP-  Projected Follow-up: PT-Home health PT, OT-  Home health OT, Other (comment)(intermittent supervision), SLP-  Projected Equipment Needs: PT-Rolling walker with 5" wheels, Cane, To be determined, OT- To be determined, SLP-   Equipment Details: PT- , OT-  Patient/family involved in discharge planning: PT- Patient,  OT-Patient, SLP-  MD ELOS: 7-10d Medical Rehab Prognosis:  Good Assessment:   69 year old right-handed female history of diabetes mellitus, ACDF 2014, chronic pain syndrome, myasthenia gravis maintained on CellCept as well as chronic prednisone and receives IVIG at [facility], asthma /OSA on CPAP, thyroid cancer, hypertension, hyperlipidemia. History taken from chart review and patient. Patient lives alone independent prior to admission and driving.  1 level home.  She does have family in the area that worked during the day.  She presented on 12/20/19 with right hemiparesis.  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  Nuclear medicine perfusion scan no pulmonary embolus.  Patient with recent carotid Doppler noting right ICA greater than 70% and left ICA 50-69% stenosis.  Echocardiogram with ejection fraction of 123456, grade 1 diastolic dysfunction.  Admission chemistries potassium 3.3, troponin negative, BNP 215.  Currently maintained on aspirin and Plavix for CVA prophylaxis.  Subcutaneous Lovenox for DVT prophylaxis.  Vascular surgery Dr. Donzetta Matters follow-up in regards to carotid stenosis no current plan for surgical intervention.  She is tolerating a regular diet   Now requiring 24/7 Rehab RN,MD, as well as CIR level PT, OT and SLP.  Treatment team will focus on ADLs and mobility with goals set at Mod I See Team Conference Notes for weekly updates to the plan of care

## 2019-12-28 NOTE — Progress Notes (Signed)
Physical Therapy Session Note  Patient Details  Name: [name] MRN: [id number] Date of Birth: 09/20/1950  Today's Date: 12/28/2019 PT Individual Time: 1937-9024 PT Individual Time Calculation (min): 68 min   Short Term Goals: Week 1:  PT Short Term Goal 1 (Week 1): Pt will ambulate with min assist 142f with LRAD PT Short Term Goal 2 (Week 1): [facility] Short Term Goal 3 (Week 1): Pt will ascend 1 step with RW and min assist  Skilled Therapeutic Interventions/Progress Updates:  Pt received sitting in recliner and agreeable to PT. [facility]. [facility]   Dynamic gait training in parallel bars forward/reverse 4 x84feach direction and side stepping R and L 4 x 10f20fil with cues for step height on the R as well as improved step length bil and improved posture. In parallel bars, Reciprocal foot taps on 4" step 3 x10 R and L.  Patient demonstrates increased fall risk as noted by score of   25/56 on Berg Balance Scale.  (<36= high risk for falls, close to 100%; 37-45 significant >80%; 46-51 moderate >50%; 52-55 lower >25%)   Gait training with RW x 33f75fth RW, CGA, and cues for posture, step height, and safety in turns. Patient returned to room and performed stand pivot to recliner with CGA and RW. Pt left sitting in recliner with call bell in reach and all needs met.      Therapy Documentation Precautions:  Precautions Precautions: Fall Restrictions Weight Bearing Restrictions: No Vital Signs: Therapy Vitals Temp: 97.9 F (36.6 C) Pulse Rate: 99 Resp: 17 BP: 116/72 Patient Position (if appropriate): Sitting Oxygen Therapy SpO2: 97 % O2 Device: Room Air Pain: Pain Assessment Pain Scale: 0-10 Pain Score: 0-No pain Pain Type: Acute pain Pain Location: Hand Pain Orientation: Left Pain Descriptors / Indicators: Cramping Pain Frequency: Occasional Pain Onset:  Gradual Patients Stated Pain Goal: 2 Pain Intervention(s): Medication (See eMAR)   Balance: Balance Balance Assessed: Yes Standardized Balance Assessment Standardized Balance Assessment: (P) Berg Balance Test Berg Balance Test Sit to Stand: (P) Able to stand using hands after several tries Standing Unsupported: (P) Able to stand 2 minutes with supervision Sitting with Back Unsupported but Feet Supported on Floor or Stool: (P) Able to sit safely and securely 2 minutes Stand to Sit: (P) Controls descent by using hands Transfers: (P) Able to transfer with verbal cueing and /or supervision Standing Unsupported with Eyes Closed: (P) Able to stand 10 seconds with supervision Standing Ubsupported with Feet Together: (P) Needs help to attain position but able to stand for 30 seconds with feet together From Standing, Reach Forward with Outstretched Arm: (P) Can reach forward >12 cm safely (5") From Standing Position, Pick up Object from Floor: (P) Unable to try/needs assist to keep balance From Standing Position, Turn to Look Behind Over each Shoulder: (P) Turn sideways only but maintains balance Turn 360 Degrees: (P) Needs assistance while turning Standing Unsupported, Alternately Place Feet on Step/Stool: (P) Needs assistance to keep from falling or unable to try Standing Unsupported, One Foot in Front: (P) Needs help to step but can hold 15 seconds Standing on One Leg: (P) Tries to lift leg/unable to hold 3 seconds but remains standing independently Total Score: (P) 25 Timed Up and Go Test TUG: Normal TUG Normal TUG (seconds): 45 Static Sitting Balance Static Sitting - Level of Assistance: 6: Modified independent (Device/Increase  time) Dynamic Sitting Balance Dynamic Sitting - Level of Assistance: 5: Stand by assistance Static Standing Balance Static Standing - Balance Support: No upper extremity supported Static Standing - Level of Assistance: 4: Min assist Dynamic Standing  Balance Dynamic Standing - Balance Support: No upper extremity supported Dynamic Standing - Level of Assistance: 3: Mod assist   Therapy/Group: Individual Therapy  [name] 12/28/2019, 4:49 PM

## 2019-12-28 NOTE — Progress Notes (Signed)
Social Work Assessment and Plan   Patient Details  Name: [name] MRN: [id number] Date of Birth: [birth year]  Today's Date: 12/28/2019  Problem List:  Patient Active Problem List   Diagnosis Date Noted  . 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[place]) 12/27/2019  . Essential hypertension   . Uncomplicated asthma   . Uncontrolled type 2 diabetes mellitus with hyperglycemia ([place])   . Thalamic stroke ([place]) 12/20/2019  . Asymptomatic carotid artery stenosis, bilateral 10/08/2018  . Hypercholesterolemia 10/08/2018  . Headache around the eyes 11/12/2016  . Cervicalgia of occipito-atlanto-axial region 11/12/2016  . Moderate persistent asthma 07/29/2015  . Current use of beta blocker 07/29/2015  . Gastroesophageal reflux disease without esophagitis 07/29/2015  . OSA on CPAP 09/19/2014  . Diabetic neuropathy with neurologic complication ([place]) [id number]  . Myasthenia gravis in remission ([place]) 09/19/2014  . Dyspnea 08/13/2014  . Erb-Goldflam disease ([place]) 07/27/2014  . Cancer of thyroid ([place]) 07/27/2014  . OSA (obstructive sleep apnea) 07/24/2014  . DM (diabetes mellitus) type II uncontrolled with eye manifestation ([place]) 06/01/2014  . 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 Nocturia more than twice per night 06/01/2014  . Snoring 06/01/2014  . Insomnia due to medical condition 06/01/2014  . Neuroma 01/16/2014  . Pseudoclaudication 11/14/2013  . Heart palpitations 06/30/2013  . Sinus tachycardia 03/29/2013  . Myasthenia gravis ([address]) 03/13/2013  . Essential hypertension, benign 03/13/2013  . Asthma, chronic 03/13/2013   Past Medical History:  Past Medical History:  Diagnosis Date  . Arthritis    "all over my body" (03/13/2013)  . Asthma   . Asymptomatic carotid artery stenosis, bilateral 10/08/2018  . GERD (gastroesophageal reflux disease)   . H/O hiatal hernia   . Heart murmur   . Hypercholesterolemia 10/08/2018  . Hypertension   . Hypothyroidism   . Myasthenia gravis ([place])    "in  my eyes; diagnsosed > 7 yr ago" (03/13/2013)  . Sleep apnea    on CPAP  . Thyroid carcinoma ([place])   . Type II diabetes mellitus ([place])    Past Surgical History:  Past Surgical History:  Procedure Laterality Date  . ABDOMINAL HYSTERECTOMY    . ANTERIOR CERVICAL DECOMP/DISCECTOMY FUSION     "I've had severa ORs; always went in from the front" (03/13/2013)  . APPENDECTOMY    . CARDIAC CATHETERIZATION     "several" (03/13/2013)  . CARPAL TUNNEL RELEASE Right   . CATARACT EXTRACTION W/ INTRAOCULAR LENS  IMPLANT, BILATERAL Bilateral   . CHOLECYSTECTOMY    . KNEE ARTHROSCOPY Left   . SHOULDER ARTHROSCOPY W/ ROTATOR CUFF REPAIR Left   . TONSILLECTOMY    . TOTAL THYROIDECTOMY     Social History:  reports that she has never smoked. She has never used smokeless tobacco. She reports that she does not drink alcohol or use drugs.  Family / Support Systems Marital Status: Single Children: None Other Supports: Family support 7 Siblings Anticipated Caregiver: Siblings Caregiver Availability: Intermittent(Al work during day)  Social History Preferred language: English Religion: Methodist Education: 2 Engineering geologist of college Read: Yes Write: Yes Employment Status: Retired   Abuse/Neglect Abuse/Neglect Assessment Can Be Completed: Yes Physical Abuse: Denies Verbal Abuse: Denies Sexual Abuse: Denies Exploitation of patient/patient's resources: Denies Self-Neglect: Denies  Emotional Status Pt's affect, behavior and adjustment status: no Recent Psychosocial Issues: no Psychiatric History: no Substance Abuse History: no  Patient / Family Perceptions, Expectations & Goals Pt/Family understanding of illness & functional limitations: Yes Premorbid pt/family roles/activities: Patient siblings able to grocery shop, meal prep  etc. Patient does not leave home due to being unable to receive COVID vaccine. So family supports. Pt/family expectations/goals: Goal to discharge back home  [employer]: None Premorbid Home Care/DME Agencies: None Transportation available at discharge: Family able to transport  Discharge Planning Living Arrangements: Alone Support Systems: Friends/neighbors, Other relatives Type of Residence: Private residence(1 Level, 2 Steps to enter.) Insurance Resources: Multimedia programmer (specify)([employer]) Financial Resources: Radio broadcast assistant Screen Referred: Yes Living Expenses: Rent Does the patient have any problems obtaining your medications?: No Social Work Anticipated Follow Up Needs: HH/OP Expected length of stay: 10-14 Days  Clinical Impression SW entered room, introduced self, explained role. Patient sitting up in chair having lunch, very pleasant and good conversation. Patient is not a English as a second language teacher. Sw will follow up with any questions or concerns   Dyanne Iha 12/28/2019, 1:29 PM

## 2019-12-28 NOTE — Progress Notes (Signed)
Inpatient Rehabilitation  Patient information reviewed and entered into eRehab system by Jentzen Minasyan M. Debora Stockdale, M.A., CCC/SLP, PPS Coordinator.  Information including medical coding, functional ability and quality indicators will be reviewed and updated through discharge.    

## 2019-12-28 NOTE — Progress Notes (Signed)
[facility] PHYSICAL MEDICINE & REHABILITATION PROGRESS NOTE   Subjective/Complaints:  Patient states that she received IVIG at home on an outpatient basis to help manage her myasthenia gravis.  She can usually tell for the last week or 2 before the next injection that her legs feel heavier. She is due for an IVIG infusion.  She does not feel like her legs are extra heavy yet however she does have the right-sided weakness from her stroke. Review of systems negative chest pain shortness of breath nausea vomiting diarrhea or constipation Objective:   No results found. No results for input(s): WBC, HGB, HCT, PLT in the last 72 hours. No results for input(s): NA, K, CL, CO2, GLUCOSE, BUN, CREATININE, CALCIUM in the last 72 hours.  Intake/Output Summary (Last 24 hours) at 12/28/2019 0900 Last data filed at 12/28/2019 0745 Gross per 24 hour  Intake 120 ml  Output --  Net 120 ml     Physical Exam: Vital Signs Blood pressure (!) 134/57, pulse 83, temperature 98.2 F (36.8 C), resp. rate 18, SpO2 98 %.   General: No acute distress Mood and affect are appropriate HEENT bilateral ptosis approximately 50% extraocular movements are intact Heart: Regular rate and rhythm no rubs murmurs or extra sounds Lungs: Clear to auscultation, breathing unlabored, no rales or wheezes Abdomen: Positive bowel sounds, soft nontender to palpation, nondistended Extremities: No clubbing, cyanosis, or edema Skin: No evidence of breakdown, no evidence of rash Neurologic: Cranial nerves II through XII intact, motor strength is 5/5 in left deltoid, bicep, tricep, grip, hip flexor, knee extensors, ankle dorsiflexor and plantar flexor 3 - right deltoid bicep tricep grip 3 - right hip flexor knee extensor to minus right ankle dorsiflexor Sensory exam normal sensation to light touch and proprioception in bilateral upper and lower extremities Cerebellar exam normal finger to nose to finger as well as heel to shin in  bilateral upper and lower extremities Musculoskeletal: Full passive range of motion in all 4 extremities. No joint swelling   Assessment/Plan: 1. Functional deficits secondary to left thalamic infarct which require 3+ hours per day of interdisciplinary therapy in a comprehensive inpatient rehab setting.  Physiatrist is providing close team supervision and 24 hour management of active medical problems listed below.  Physiatrist and rehab team continue to assess barriers to discharge/monitor patient progress toward functional and medical goals  Care Tool:  Bathing              Bathing assist       Upper Body Dressing/Undressing Upper body dressing        Upper body assist      Lower Body Dressing/Undressing Lower body dressing            Lower body assist       Toileting Toileting    Toileting assist Assist for toileting: Minimal Assistance - Patient > 75%     Transfers Chair/bed transfer  Transfers assist           Locomotion Ambulation   Ambulation assist              Walk 10 feet activity   Assist           Walk 50 feet activity   Assist           Walk 150 feet activity   Assist           Walk 10 feet on uneven surface  activity   Assist  Wheelchair     Assist               Wheelchair 50 feet with 2 turns activity    Assist            Wheelchair 150 feet activity     Assist          Blood pressure (!) 134/57, pulse 83, temperature 98.2 F (36.8 C), resp. rate 18, SpO2 98 %.    Medical Problem List and Plan: 1.  Right side weakness secondary to left thalamic infarction secondary to small vessel disease             -patient may shower             -ELOS/Goals: 10-14 days/supervision/Mod I             CIR evaluations today with PT and OT 2.  Antithrombotics: -DVT/anticoagulation: Lovenox             -antiplatelet therapy: Aspirin 81 mg daily and Plavix 75 mg daily  x3 weeks then aspirin alone 3. Pain Management: Lidoderm patch, Voltaren gel as needed 4. Mood: Provide emotional support             -antipsychotic agents: N/A 5. Neuropsych: This patient is capable of making decisions on her own behalf. 6. Skin/Wound Care: Routine skin checks 7. Fluids/Electrolytes/Nutrition: Routine in and outs.  CMP ordered. 8.  Bilateral carotid stenosis.  Follow-up outpatient vascular as outpatient. 9.  Myasthenia gravis.  Continue Cellcept 1500  prednisone 10 mg daily.  She is followed at [facility] and also receives IVIG at home.  If patient fails progressive bilateral lower extremity weakness may need to perform IVIG infusion in the hospital 10.  Diabetes mellitus with hyperglycemia.  Hemoglobin A1c 9.1.  NovoLog 15 units 3 times daily, Levemir 35 units daily.  Check blood sugars before meals and at bedtime.             Monitor with increased mobility 11.  OSA/asthma.  Singulair 10 mg daily, continue inhalers as directed as well as CPAP.             Monitor with increased exertion 12.Hypertension.  Avapro 150 mg daily, Aldactone 25 mg daily.               Monitor with increased mobility  13.  Hypothyroidism.  Synthroid 14.  Hyperlipidemia.  Zetia/Crestor 15.  GERD.  Protonix  LOS: 1 days A FACE TO [place] E Tawanda Schall 12/28/2019, 9:00 AM

## 2019-12-29 ENCOUNTER — Inpatient Hospital Stay (HOSPITAL_COMMUNITY): Payer: Medicare Other | Admitting: Occupational Therapy

## 2019-12-29 ENCOUNTER — Inpatient Hospital Stay (HOSPITAL_COMMUNITY): Payer: Medicare Other

## 2019-12-29 ENCOUNTER — Inpatient Hospital Stay (HOSPITAL_COMMUNITY): Payer: Medicare Other | Admitting: Physical Therapy

## 2019-12-29 ENCOUNTER — Other Ambulatory Visit: Payer: Self-pay

## 2019-12-29 LAB — GLUCOSE, CAPILLARY
Glucose-Capillary: 118 mg/dL — ABNORMAL HIGH (ref 70–99)
Glucose-Capillary: 231 mg/dL — ABNORMAL HIGH (ref 70–99)
Glucose-Capillary: 322 mg/dL — ABNORMAL HIGH (ref 70–99)
Glucose-Capillary: 230 mg/dL — ABNORMAL HIGH (ref 70–99)

## 2019-12-29 NOTE — Progress Notes (Signed)
[facility] PHYSICAL MEDICINE & REHABILITATION PROGRESS NOTE   Subjective/Complaints:  Patient states that she received IVIG at home on an outpatient basis to help manage her myasthenia gravis.  She can usually tell for the last week or 2 before the next injection that her legs feel heavier. She is due for an IVIG infusion.  However she does not feel like her legs are getting heavy yet.  Review of systems negative chest pain shortness of breath nausea vomiting diarrhea or constipation Objective:   No results found. Recent Labs    12/28/19 1159  WBC 6.6  HGB 10.0*  HCT 31.7*  PLT 254   Recent Labs    12/28/19 1159  NA 137  K 3.8  CL 109  CO2 21*  GLUCOSE 308*  BUN 20  CREATININE 0.83  CALCIUM 9.0    Intake/Output Summary (Last 24 hours) at 12/29/2019 1319 Last data filed at 12/29/2019 0745 Gross per 24 hour  Intake 342 ml  Output --  Net 342 ml     Physical Exam: Vital Signs Blood pressure (!) 130/56, pulse 82, temperature 98.2 F (36.8 C), temperature source Oral, resp. rate 18, height 5\' 3"  (1.6 m), weight 81.6 kg, SpO2 98 %.    General: No acute distress Mood and affect are appropriate Heart: Regular rate and rhythm no rubs murmurs or extra sounds Lungs: Clear to auscultation, breathing unlabored, no rales or wheezes Abdomen: Positive bowel sounds, soft nontender to palpation, nondistended Extremities: No clubbing, cyanosis, or edema  Neurologic: Cranial nerves II through XII intact, motor strength is 5/5 in left deltoid, bicep, tricep, grip, hip flexor, knee extensors, ankle dorsiflexor and plantar flexor 3 - right deltoid bicep tricep grip 3 - right hip flexor knee extensor to minus right ankle dorsiflexor Sensory exam normal sensation to light touch and proprioception in bilateral upper and lower extremities Cerebellar exam normal finger to nose to finger as well as heel to shin in bilateral upper and lower extremities Musculoskeletal: Full passive range of  motion in all 4 extremities. No joint swelling   Assessment/Plan: 1. Functional deficits secondary to left thalamic infarct which require 3+ hours per day of interdisciplinary therapy in a comprehensive inpatient rehab setting.  Physiatrist is providing close team supervision and 24 hour management of active medical problems listed below.  Physiatrist and rehab team continue to assess barriers to discharge/monitor patient progress toward functional and medical goals  Care Tool:  Bathing    Body parts bathed by patient: Right arm, Chest, Abdomen, Right upper leg, Left upper leg, Face   Body parts bathed by helper: Left arm, Front perineal area, Buttocks, Right lower leg, Left lower leg     Bathing assist Assist Level: Maximal Assistance - Patient 24 - 49%     Upper Body Dressing/Undressing Upper body dressing   What is the patient wearing?: Pull over shirt    Upper body assist Assist Level: Moderate Assistance - Patient 50 - 74%    Lower Body Dressing/Undressing Lower body dressing      What is the patient wearing?: Underwear/pull up, Pants     Lower body assist Assist for lower body dressing: Moderate Assistance - Patient 50 - 74%     Toileting Toileting    Toileting assist Assist for toileting: Minimal Assistance - Patient > 75%     Transfers Chair/bed transfer  Transfers assist     Chair/bed transfer assist level: Moderate Assistance - Patient 50 - 74%     Locomotion Ambulation  Ambulation assist      Assist level: Moderate Assistance - Patient 50 - 74% Assistive device: Hand held assist Max distance: 15   Walk 10 feet activity   Assist     Assist level: Moderate Assistance - Patient - 50 - 74% Assistive device: Hand held assist   Walk 50 feet activity   Assist Walk 50 feet with 2 turns activity did not occur: Safety/medical concerns         Walk 150 feet activity   Assist Walk 150 feet activity did not occur: Safety/medical  concerns         Walk 10 feet on uneven surface  activity   Assist Walk 10 feet on uneven surfaces activity did not occur: Safety/medical concerns         Wheelchair     Assist   Type of Wheelchair: Manual    Wheelchair assist level: Moderate Assistance - Patient 50 - 74% Max wheelchair distance: 50    Wheelchair 50 feet with 2 turns activity    Assist        Assist Level: Moderate Assistance - Patient 50 - 74%   Wheelchair 150 feet activity     Assist      Assist Level: Maximal Assistance - Patient 25 - 49%   Blood pressure (!) 130/56, pulse 82, temperature 98.2 F (36.8 C), temperature source Oral, resp. rate 18, height 5\' 3"  (1.6 m), weight 81.6 kg, SpO2 98 %.    Medical Problem List and Plan: 1.  Right side weakness secondary to left thalamic infarction secondary to small vessel disease             -patient may shower             -ELOS/Goals: 10-14 days/supervision/Mod I             CIR evaluations today with PT and OT 2.  Antithrombotics: -DVT/anticoagulation: Lovenox             -antiplatelet therapy: Aspirin 81 mg daily and Plavix 75 mg daily x3 weeks then aspirin alone 3. Pain Management: Lidoderm patch, Voltaren gel as needed 4. Mood: Provide emotional support             -antipsychotic agents: N/A 5. Neuropsych: This patient is capable of making decisions on her own behalf. 6. Skin/Wound Care: Routine skin checks 7. Fluids/Electrolytes/Nutrition: Routine in and outs.  CMP ordered. 8.  Bilateral carotid stenosis.  Follow-up outpatient vascular as outpatient. 9.  Myasthenia gravis.  Continue Cellcept 1500  prednisone 10 mg daily.  She is [facility] at [facility] and also receives IVIG at home.  If patient fails progressive bilateral lower extremity weakness may need to perform IVIG infusion in the hospital 10.  Diabetes mellitus with hyperglycemia.  Hemoglobin A1c 9.1.  NovoLog 15 units 3 times daily, Levemir 35 units daily.  Check  blood sugars before meals and at bedtime.            CBG (last 3)  Recent Labs    12/28/19 2104 12/29/19 0624 12/29/19 1129  GLUCAP 148* 118* 231*  Control improving but occ spikes cont to monitor 11.  OSA/asthma.  Singulair 10 mg daily, continue inhalers as directed as well as CPAP.             Monitor with increased exertion 12.Hypertension.  Avapro 150 mg daily, Aldactone 25 mg daily.             Vitals:   12/29/19 0601  12/29/19 0654  BP: (!) 130/56 (!) 130/56  Pulse: 82 82  Resp: 18 18  Temp: 98.2 F (36.8 C) 98.2 F (36.8 C)  SpO2: 98%    13.  Hypothyroidism.  Synthroid 14.  Hyperlipidemia.  Zetia/Crestor 15.  GERD.  Protonix  LOS: 2 days A FACE TO FACE EVALUATION WAS PERFORMED  Charlett Blake 12/29/2019, 1:19 PM

## 2019-12-29 NOTE — Progress Notes (Signed)
Occupational Therapy Session Note  Patient Details  Name: [name] MRN: [id number] Date of Birth: 20-Apr-1951  Today's Date: 12/29/2019 OT Individual Time: [id number] OT Individual Time Calculation (min): 77 min   Short Term Goals: Week 1:  OT Short Term Goal 1 (Week 1): Pt will perform toileting with min A overall. OT Short Term Goal 2 (Week 1): Pt will standing for grooming tasks with min guard for balance. OT Short Term Goal 3 (Week 1): Pt will perform shower transfer with min A overall. OT Short Term Goal 4 (Week 1): Pt will perform LB dressing with min A overall.  Skilled Therapeutic Interventions/Progress Updates:    Pt greeted in her recliner, premedicated for pain and requesting to shower. Ambulatory transfer to shower chair completed using RW with CGA. OT covered access port and set pt up with a shower cap. Pt then bathed at sit<stand level with min vcs to increase functional use of the Rt hand. She was able to wash the Lt side using her affected arm unassisted. CGA for standing balance while she completed perihygiene with unilateral support on the grab bar. She then ambulated to the EOB where she proceeded with dressing tasks at sit<stand level using RW. Pt required assistance to elevate pants on the Rt side fully though was able to assist with this aspect of the ADL using her Rt hand with increased time. Grooming tasks completed while seated with setup. We also discussed her DME needs for home. Pt already has a 3:1 but will need something for her tub shower, most likely a TTB though she is undecided as she wants to look into other options first. Pt then transferred to her recliner and was left with all needs within reach and chair alarm activated. Tx focus placed on Rt NMR, ADL retraining, functional transfers, and dynamic standing balance.    Therapy Documentation Precautions:  Precautions Precautions: Fall Restrictions Weight Bearing Restrictions: No Vital Signs: Therapy  Vitals Temp: 97.8 F (36.6 C) Temp Source: Oral Pulse Rate: (!) 105 Resp: 17 BP: 119/68 Patient Position (if appropriate): Sitting Oxygen Therapy SpO2: 98 % O2 Device: Room Air ADL:       Therapy/Group: Individual Therapy  [name] A Willi Borowiak 12/29/2019, 4:26 PM

## 2019-12-29 NOTE — Progress Notes (Signed)
Occupational Therapy Session Note  Patient Details  Name: [name] MRN: [id number] Date of Birth: 09/04/1950  Today's Date: 12/29/2019 OT Individual Time: 1300-1341 OT Individual Time Calculation (min): 41 min    Short Term Goals: Week 1:  OT Short Term Goal 1 (Week 1): Pt will perform toileting with min A overall. OT Short Term Goal 2 (Week 1): Pt will standing for grooming tasks with min guard for balance. OT Short Term Goal 3 (Week 1): Pt will perform shower transfer with min A overall. OT Short Term Goal 4 (Week 1): Pt will perform LB dressing with min A overall.  Skilled Therapeutic Interventions/Progress Updates:    1:1. Pt received in bed agreeable to OT. Pt ambulates with no AD and min-mod HHA to transfer onto toilet. Pt completes toileting with MIN A for standing balance for 2/3 components. Pt stands at sink for grooming and hand hygiene. Pt completes seated box and blocks assessment RUE: 38; LUE:48. Provided pt with foam handles for toothbrush and utensils. Pt scoops small checkers out of container, completes hand to mouth excursion and places onto table for NMR of LUE with edu re application of handle onto toothbrush and utensils. Pt reports will attempt self feeding with dinner. Exited session wih tpt esated in recliner, exit alarm on and call light inreach  Therapy Documentation Precautions:  Precautions Precautions: Fall Restrictions Weight Bearing Restrictions: No General:   Vital Signs:  Pain:   ADL:   Vision   Perception    Praxis   Exercises:   Other Treatments:     Therapy/Group: Individual Therapy  Tonny Branch 12/29/2019, 1:42 PM

## 2019-12-29 NOTE — Progress Notes (Signed)
Physical Therapy Session Note  Patient Details  Name: [name] MRN: [id number] Date of Birth: 01-01-1951  Today's Date: 12/29/2019 PT Individual Time: 0800-0859 PT Individual Time Calculation (min): 59 min   Short Term Goals: Week 1:  PT Short Term Goal 1 (Week 1): Pt will ambulate with min assist 169f with LRAD PT Short Term Goal 2 (Week 1): [facility] PT Short Term Goal 3 (Week 1): Pt will ascend 1 step with RW and min assist  Skilled Therapeutic Interventions/Progress Updates:  Pt received supine in bed and agreeable to PT. Supine>sit transfer with supervision assist and cues for use of bed features as needed. Stand pivot [facility] and min assist. RN present to complete medication administration. [facility]   Gait training with RW x 49fand [id number] CGA-supervision assist. Min cues from PT for step height as well as improved heel contact, decreased upper trap activation, and  improved gait speed to reduce time in SLS and reduce fall risk. Dynamic gait training to weave through 5 cones with RW and CGA from PT for safety as well as cues for AD management and step width on the R.   Dynamic balance training: standing on airex pad with 1 UE support on RW to perform low and medium difficulty peg board puzzles by placeing pieces with cross body reach with LUE and returning to bucket with cross body reach with the RUE. Min assist for safety and improved trunkal rotation and sequencing of movement in RUE.   Patient returned to room and performed stand pivot to recliner with CGA and ambulatory transfer using RW. Pt left sitting in recliner with call bell in reach and all needs met.         Therapy Documentation Precautions:  Precautions Precautions: Fall Restrictions Weight Bearing Restrictions: No General:   Vital Signs: Therapy Vitals Temp: 98.2 F (36.8 C) Temp Source: Oral Pulse Rate: 82 Resp:  18 BP: (!) 130/56 Patient Position (if appropriate): Lying Oxygen Therapy SpO2: 98 % O2 Device: Room Air Pain: Pain Assessment Pain Scale: 0-10 Pain Score: 0-No pain Mobility:   Locomotion :    Trunk/Postural Assessment :    Balance:   Exercises:   Other Treatments:      Therapy/Group: Individual Therapy  AusLorie Phenix7/2021, 9:05 AM

## 2019-12-30 ENCOUNTER — Inpatient Hospital Stay (HOSPITAL_COMMUNITY): Payer: Medicare Other | Admitting: Occupational Therapy

## 2019-12-30 ENCOUNTER — Inpatient Hospital Stay (HOSPITAL_COMMUNITY): Payer: Medicare Other

## 2019-12-30 LAB — GLUCOSE, CAPILLARY
Glucose-Capillary: 142 mg/dL — ABNORMAL HIGH (ref 70–99)
Glucose-Capillary: 328 mg/dL — ABNORMAL HIGH (ref 70–99)
Glucose-Capillary: 261 mg/dL — ABNORMAL HIGH (ref 70–99)
Glucose-Capillary: 104 mg/dL — ABNORMAL HIGH (ref 70–99)

## 2019-12-30 MED ORDER — LORATADINE 10 MG PO TABS
10.0000 mg | ORAL_TABLET | Freq: Every day | ORAL | Status: DC
  Administered 2019-12-30 – 2019-12-31 (×2): 10 mg via ORAL
  Filled 2019-12-30 (×5): qty 1

## 2019-12-30 MED ORDER — CHLORHEXIDINE GLUCONATE CLOTH 2 % EX PADS
6.0000 | MEDICATED_PAD | Freq: Two times a day (BID) | CUTANEOUS | Status: DC
  Administered 2019-12-30 – 2020-01-09 (×8): 6 via TOPICAL

## 2019-12-30 MED ORDER — INSULIN ASPART 100 UNIT/ML ~~LOC~~ SOLN
16.0000 [IU] | Freq: Three times a day (TID) | SUBCUTANEOUS | Status: DC
  Administered 2019-12-30 – 2020-01-09 (×31): 16 [IU] via SUBCUTANEOUS

## 2019-12-30 MED ORDER — INSULIN DETEMIR 100 UNIT/ML ~~LOC~~ SOLN
45.0000 [IU] | Freq: Every day | SUBCUTANEOUS | Status: DC
  Administered 2019-12-31 – 2020-01-04 (×5): 45 [IU] via SUBCUTANEOUS
  Filled 2019-12-30 (×5): qty 0.45

## 2019-12-30 MED ORDER — FLUTICASONE PROPIONATE 50 MCG/ACT NA SUSP
1.0000 | Freq: Every day | NASAL | Status: DC
  Administered 2019-12-30 – 2020-01-08 (×7): 1 via NASAL
  Filled 2019-12-30: qty 16

## 2019-12-30 MED ORDER — INSULIN DETEMIR 100 UNIT/ML ~~LOC~~ SOLN
5.0000 [IU] | Freq: Once | SUBCUTANEOUS | Status: AC
  Administered 2019-12-30: 5 [IU] via SUBCUTANEOUS
  Filled 2019-12-30: qty 0.05

## 2019-12-30 NOTE — Progress Notes (Signed)
Physical Therapy Session Note  Patient Details  Name: [name] MRN: [id number] Date of Birth: 17-Sep-1950  Today's Date: 12/30/2019 PT Individual Time: 1002-1100 PT Individual Time Calculation (min): 58 min   Short Term Goals: Week 1:  PT Short Term Goal 1 (Week 1): Pt will ambulate with min assist 191ft with LRAD PT Short Term Goal 2 (Week 1): Pt will transfer to [facility] with supervision assist PT Short Term Goal 3 (Week 1): Pt will ascend 1 step with RW and min assist  Skilled Therapeutic Interventions/Progress Updates:     Pt received seated in [facility] and agreeable to therapy. Reports headache pain. Number not provided. RN aware and pt given rest breaks as needed throughout session for pain management.  WC transport outside for time management. Pt performs ambulation outside for gait training over unlevel graded surfaces. Ambulates bouts of 80', 130' and 60' with supervision and seated rest breaks. Pt initially has decreased stance time on R, decreased R stride length, and very slow gait speed. Able to improve all aspects of gait pattern with cuing from PT.   WC transport to therapy gym. Pt performs additional gait training in // bars. Forward and backward gait and lateral side stepping. Following seated rest break pt performs additional side stepping with 4 inch step and approaching from bother directions, requiring minA/CGA for safety.  WC transport back to room. Pt performs stand step transfer to recliner without AD and ambulating 15' with minA at hips for slight LOB to right. Pt left seated in recliner with alarm intact and all needs within reach.  Therapy Documentation Precautions:  Precautions Precautions: Fall Restrictions Weight Bearing Restrictions: No    Therapy/Group: Individual Therapy  Breck Coons, PT, DPT 12/30/2019, 4:04 PM

## 2019-12-30 NOTE — Progress Notes (Addendum)
[place] PHYSICAL MEDICINE & REHABILITATION PROGRESS NOTE   Subjective/Complaints:  Didn't have cpap set up last night. Struggled with sleep as a result  ROS: Patient denies fever, rash, sore throat, blurred vision, nausea, vomiting, diarrhea, cough, shortness of breath or chest pain, joint or back pain, headache, or mood change.    Objective:   No results found. Recent Labs    12/28/19 1159  WBC 6.6  HGB 10.0*  HCT 31.7*  PLT 254   Recent Labs    12/28/19 1159  NA 137  K 3.8  CL 109  CO2 21*  GLUCOSE 308*  BUN 20  CREATININE 0.83  CALCIUM 9.0    Intake/Output Summary (Last 24 hours) at 12/30/2019 1019 Last data filed at 12/29/2019 1750 Gross per 24 hour  Intake 417 ml  Output --  Net 417 ml     Physical Exam: Vital Signs Blood pressure 127/60, pulse 84, temperature 97.9 F (36.6 C), resp. rate 17, height 5\' 3"  (1.6 m), weight 81.6 kg, SpO2 98 %.    Constitutional: No distress . Vital signs reviewed. HEENT: EOMI, oral membranes moist Neck: supple Cardiovascular: RRR without murmur. No JVD    Respiratory/Chest: CTA Bilaterally without wheezes or rales. Normal effort    GI/Abdomen: BS +, non-tender, non-distended Ext: no clubbing, cyanosis, or edema Psych: pleasant and cooperative Neurologic: Cranial nerves II through XII intact, motor strength is 5/5 in left deltoid, bicep, tricep, grip, hip flexor, knee extensors, ankle dorsiflexor and plantar flexor 3-/5 right deltoid bicep tricep grip 3-/5 right hip flexor knee extensor to minus right ankle dorsiflexor Normal sensory.  Musculoskeletal: Full passive range of motion in all 4 extremities. No joint swelling   Assessment/Plan: 1. Functional deficits secondary to left thalamic infarct which require 3+ hours per day of interdisciplinary therapy in a comprehensive inpatient rehab setting.  Physiatrist is providing close team supervision and 24 hour management of active medical problems listed  below.  Physiatrist and rehab team continue to assess barriers to discharge/monitor patient progress toward functional and medical goals  Care Tool:  Bathing    Body parts bathed by patient: Right arm, Chest, Abdomen, Right upper leg, Left upper leg, Face, Front perineal area, Buttocks, Right lower leg, Left lower leg, Left arm   Body parts bathed by helper: Left arm, Front perineal area, Buttocks, Right lower leg, Left lower leg     Bathing assist Assist Level: Contact Guard/Touching assist     Upper Body Dressing/Undressing Upper body dressing   What is the patient wearing?: Pull over shirt, Bra    Upper body assist Assist Level: Contact Guard/Touching assist    Lower Body Dressing/Undressing Lower body dressing      What is the patient wearing?: Underwear/pull up, Pants     Lower body assist Assist for lower body dressing: Minimal Assistance - Patient > 75%     Toileting Toileting    Toileting assist Assist for toileting: Minimal Assistance - Patient > 75%     Transfers Chair/bed transfer  Transfers assist     Chair/bed transfer assist level: Minimal Assistance - Patient > 75%     Locomotion Ambulation   Ambulation assist      Assist level: Moderate Assistance - Patient 50 - 74% Assistive device: Hand held assist Max distance: 15   Walk 10 feet activity   Assist     Assist level: Moderate Assistance - Patient - 50 - 74% Assistive device: Hand held assist   Walk 50 feet activity  Assist Walk 50 feet with 2 turns activity did not occur: Safety/medical concerns         Walk 150 feet activity   Assist Walk 150 feet activity did not occur: Safety/medical concerns         Walk 10 feet on uneven surface  activity   Assist Walk 10 feet on uneven surfaces activity did not occur: Safety/medical concerns         Wheelchair     Assist   Type of Wheelchair: Manual    Wheelchair assist level: Moderate Assistance - Patient  50 - 74% Max wheelchair distance: 50    Wheelchair 50 feet with 2 turns activity    Assist        Assist Level: Moderate Assistance - Patient 50 - 74%   Wheelchair 150 feet activity     Assist      Assist Level: Maximal Assistance - Patient 25 - 49%   Blood pressure 127/60, pulse 84, temperature 97.9 F (36.6 C), resp. rate 17, height 5\' 3"  (1.6 m), weight 81.6 kg, SpO2 98 %.    Medical Problem List and Plan: 1.  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  Antithrombotics: -DVT/anticoagulation: Lovenox             -antiplatelet therapy: Aspirin 81 mg daily and Plavix 75 mg daily x3 weeks then aspirin alone 3. Pain Management: Lidoderm patch, Voltaren gel as needed 4. Mood: Provide emotional support             -antipsychotic agents: N/A 5. Neuropsych: This patient is capable of making decisions on her own behalf. 6. Skin/Wound Care: Routine skin checks 7. Fluids/Electrolytes/Nutrition: Routine in and outs.  CMP ordered. 8.  Bilateral carotid stenosis.  Follow-up outpatient vascular as outpatient. 9.  Myasthenia gravis.  Continue Cellcept 1500  prednisone 10 mg daily.  She is [facility] at [facility] and also receives IVIG at home.  If patient fails progressive bilateral lower extremity weakness may need to perform IVIG infusion in the hospital 10.  Diabetes mellitus with hyperglycemia.  Hemoglobin A1c 9.1.  NovoLog 15 units 3 times daily, Levemir 35 units daily.  Check blood sugars before meals and at bedtime.            CBG (last 3)  Recent Labs    12/29/19 1636 12/29/19 2113 12/30/19 0625  GLUCAP 322* 230* 142*   -5/8 remains poorly controlled. Increase levemir to 45u daily.   -increase mealtime covg to 16u 11.  OSA/asthma.  Singulair 10 mg daily, continue inhalers as  directed as well as CPAP.             Monitor with increased exertio n  5/8 cpap reordered 12.Hypertension.  Avapro 150 mg daily, Aldactone 25 mg daily.            5/8 controlled Vitals:   12/29/19 2037 12/30/19 0450  BP: (!) 111/55 127/60  Pulse: 92 84  Resp:  17  Temp: 98.1 F (36.7 C) 97.9 F (36.6 C)  SpO2: 99% 98%   13.  Hypothyroidism.  Synthroid 14.  Hyperlipidemia.  Zetia/Crestor 15.  GERD.  Protonix  LOS: 3  days A FACE TO FACE EVALUATION WAS PERFORMED  Meredith Staggers 12/30/2019, 10:19 AM

## 2019-12-30 NOTE — Progress Notes (Signed)
Occupational Therapy Session Note  Patient Details  Name: [name] MRN: [id number] Date of Birth: 10/06/1950  Today's Date: 12/30/2019 OT Individual Time: [phone] and [id number] OT Individual Time Calculation (min): 74 min and 62 min  Short Term Goals: Week 1:  OT Short Term Goal 1 (Week 1): Pt will perform toileting with min A overall. OT Short Term Goal 2 (Week 1): Pt will standing for grooming tasks with min guard for balance. OT Short Term Goal 3 (Week 1): Pt will perform shower transfer with min A overall. OT Short Term Goal 4 (Week 1): Pt will perform LB dressing with min A overall.  Skilled Therapeutic Interventions/Progress Updates:    Pt greeted in bed, premedicated for pain and opting to wait until PM OT session for her shower. She wanted to start the session by brushing her teeth. Supine<sit from bed without bedrail use and HOB elevated (per setup at home) completed with setup. Pts bed is higher at home so we practiced scooting forward for feet to touch floor before transferring off of bed. Pt ambulated with RW to the sink with CGA. Oral care completed while standing using built up toothbrush to incorporate Rt hand at dominant level, pt implementing active assist techniques using Lt for thoroughness when given instruction. Steady assist when using Rt UE to lift her mouthwash container and pour mouthwash into small cup for rinsing. She then completed dressing tasks sit<stand level from elevated bed using the RW for standing support. Educated pt on adaptive overhead technique for donning bra with CGA. Min A to elevate pants fully on the Rt side and also don the Rt gripper sock. Pt reaching for clothing items using the Rt hand with min vcs overall to increase use of Rt during dressing. Total A for Teds. Pt then transferred to the w/c. Escorted her down to the tub shower room and demonstrated TTB transfer technique. Pt then practiced this transfer using RW with CGA. She already has  nonslip treads on the floor of the shower, states she plans to have a relative install a horizontal grab bar before d/c home. OT also provided her with a walker bag to increase ease of ADL item transport during her BADL routine. Pt appreciative. She was then escorted back to the room and completed stand pivot<recliner with CGA. Left pt with all needs within reach and chair alarm activated.    2nd Session 1:1 tx (62 min) Pt greeted in her recliner and premedicated for pain. Requesting to shower. She completed toileting (sit<stand from standard toilet), bathing (sit<stand from shower chair, access port covered), and dressing + grooming tasks (EOB sit<stand using RW, bed elevated per home setup) during session. Tx focus placed on Rt NMR, functional transfers, ADL retraining, and dynamic balance. All functional transfers completed at ambulatory level with CGA-supervision assist using RW. CGA for dynamic standing balance during toileting and close supervision for dynamic standing when showering with unilateral support on grab bar. During dressing, pt required vcs to increase functional use of Rt vs overcompensating with the Lt hand. Had her reach for each clothing item with the Rt hand. Pt able to fully elevate pants with CGA for balance alone today! We celebrated! She also used the walker bag to transport her grooming items during ADL. At end of session pt side stepped towards [facility] using RW with vcs, and then transitioned to supine without bedrail use. Pt remained in bed with all needs within reach and bed alarm set.   Therapy Documentation Precautions:  Precautions Precautions: Fall Restrictions Weight Bearing Restrictions: No ADL:       Therapy/Group: Individual Therapy  Itzamara Casas A Angeleah Labrake 12/30/2019, 12:06 PM

## 2019-12-31 LAB — GLUCOSE, CAPILLARY
Glucose-Capillary: 108 mg/dL — ABNORMAL HIGH (ref 70–99)
Glucose-Capillary: 112 mg/dL — ABNORMAL HIGH (ref 70–99)
Glucose-Capillary: 230 mg/dL — ABNORMAL HIGH (ref 70–99)
Glucose-Capillary: 209 mg/dL — ABNORMAL HIGH (ref 70–99)
Glucose-Capillary: 111 mg/dL — ABNORMAL HIGH (ref 70–99)

## 2019-12-31 NOTE — Progress Notes (Signed)
Patient has home CPAP set up at bedside. No assistance needed from Respiratory at this time.

## 2019-12-31 NOTE — Progress Notes (Signed)
[place] PHYSICAL MEDICINE & REHABILITATION PROGRESS NOTE   Subjective/Complaints:  Had a much better night. Slept more with CPAP in place. Flonase/claritin helped with sinus congestion and headache also  ROS: Patient denies fever, rash, sore throat, blurred vision, nausea, vomiting, diarrhea, cough, shortness of breath or chest pain, joint or back pain,  .    Objective:   No results found. Recent Labs    12/28/19 1159  WBC 6.6  HGB 10.0*  HCT 31.7*  PLT 254   Recent Labs    12/28/19 1159  NA 137  K 3.8  CL 109  CO2 21*  GLUCOSE 308*  BUN 20  CREATININE 0.83  CALCIUM 9.0    Intake/Output Summary (Last 24 hours) at 12/31/2019 0924 Last data filed at 12/31/2019 0751 Gross per 24 hour  Intake 900 ml  Output -  Net 900 ml     Physical Exam: Vital Signs Blood pressure 118/65, pulse 85, temperature 97.9 F (36.6 C), resp. rate 17, height 5\' 3"  (1.6 m), weight 81.6 kg, SpO2 99 %.    Constitutional: No distress . Vital signs reviewed. HEENT: EOMI, oral membranes moist Neck: supple Cardiovascular: RRR without murmur. No JVD    Respiratory/Chest: CTA Bilaterally without wheezes or rales. Normal effort    GI/Abdomen: BS +, non-tender, non-distended Ext: no clubbing, cyanosis, or edema Psych: pleasant and cooperative Neurologic: Cranial nerves II through XII intact, motor strength is 5/5 in left deltoid, bicep, tricep, grip, hip flexor, knee extensors, ankle dorsiflexor and plantar flexor 3-/5 right deltoid bicep tricep grip 3-/5 right hip flexor knee extensor to minus right ankle dorsiflexor Decreased LT right face/arm  Musculoskeletal: Full passive range of motion in all 4 extremities. No joint swelling   Assessment/Plan: 1. Functional deficits secondary to left thalamic infarct which require 3+ hours per day of interdisciplinary therapy in a comprehensive inpatient rehab setting.  Physiatrist is providing close team supervision and 24 hour management of active  medical problems listed below.  Physiatrist and rehab team continue to assess barriers to discharge/monitor patient progress toward functional and medical goals  Care Tool:  Bathing    Body parts bathed by patient: Right arm, Chest, Abdomen, Right upper leg, Left upper leg, Face, Front perineal area, Buttocks, Right lower leg, Left lower leg, Left arm   Body parts bathed by helper: Left arm, Front perineal area, Buttocks, Right lower leg, Left lower leg     Bathing assist Assist Level: Contact Guard/Touching assist     Upper Body Dressing/Undressing Upper body dressing   What is the patient wearing?: Pull over shirt, Bra    Upper body assist Assist Level: Contact Guard/Touching assist    Lower Body Dressing/Undressing Lower body dressing      What is the patient wearing?: Underwear/pull up, Pants     Lower body assist Assist for lower body dressing: Minimal Assistance - Patient > 75%     Toileting Toileting    Toileting assist Assist for toileting: Supervision/Verbal cueing     Transfers Chair/bed transfer  Transfers assist     Chair/bed transfer assist level: Supervision/Verbal cueing     Locomotion Ambulation   Ambulation assist      Assist level: Supervision/Verbal cueing Assistive device: Walker-rolling Max distance: 130'   Walk 10 feet activity   Assist     Assist level: Moderate Assistance - Patient - 50 - 74% Assistive device: Walker-rolling   Walk 50 feet activity   Assist Walk 50 feet with 2 turns activity did  not occur: Safety/medical concerns  Assist level: Supervision/Verbal cueing Assistive device: Walker-rolling    Walk 150 feet activity   Assist Walk 150 feet activity did not occur: Safety/medical concerns         Walk 10 feet on uneven surface  activity   Assist Walk 10 feet on uneven surfaces activity did not occur: Safety/medical concerns   Assist level: Supervision/Verbal cueing Assistive device:  Aeronautical engineer   Type of Wheelchair: Manual    Wheelchair assist level: Moderate Assistance - Patient 50 - 74% Max wheelchair distance: 50    Wheelchair 50 feet with 2 turns activity    Assist        Assist Level: Moderate Assistance - Patient 50 - 74%   Wheelchair 150 feet activity     Assist      Assist Level: Maximal Assistance - Patient 25 - 49%   Blood pressure 118/65, pulse 85, temperature 97.9 F (36.6 C), resp. rate 17, height 5\' 3"  (1.6 m), weight 81.6 kg, SpO2 99 %.    Medical Problem List and Plan: 1.  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  Antithrombotics: -DVT/anticoagulation: Lovenox             -antiplatelet therapy: Aspirin 81 mg daily and Plavix 75 mg daily x3 weeks then aspirin alone 3. Pain Management: Lidoderm patch, Voltaren gel as needed  5/9-H/A likely d/t sinus congestion/allergies--continue flonase/claritin 4. Mood: Provide emotional support             -antipsychotic agents: N/A 5. Neuropsych: This patient is capable of making decisions on her own behalf. 6. Skin/Wound Care: Routine skin checks 7. Fluids/Electrolytes/Nutrition: Routine in and outs.  CMP ordered. 8.  Bilateral carotid stenosis.  Follow-up outpatient vascular as outpatient. 9.  Myasthenia gravis.  Continue Cellcept 1500  prednisone 10 mg daily.  She is followed at [facility] and also receives IVIG at home.  If patient fails progressive bilateral lower extremity weakness may need to perform IVIG infusion in the hospital 10.  Diabetes mellitus with hyperglycemia.  Hemoglobin A1c 9.1.  NovoLog 15 units 3 times daily, Levemir 35 units daily.  Check blood sugars before meals and at bedtime.            CBG (last 3)  Recent Labs    12/30/19 2114  12/31/19 0515 12/31/19 0616  GLUCAP 104* 108* 112*   -5/8 remains poorly controlled. Increase levemir to 45u daily.   -increase mealtime covg to 16u 5/9 improved control with current regimen 11.  OSA/asthma.  Singulair 10 mg daily, continue inhalers as directed as well as CPAP.             Monitor with increased exertio n  CPAP resumed 5/8 with improved sleep 12.Hypertension.  Avapro 150 mg daily, Aldactone 25 mg daily.            5/9 controlled Vitals:   12/30/19 2027 12/31/19 0451  BP: 110/68 118/65  Pulse: 88 85  Resp: 18 17  Temp: 97.9 F (36.6 C) 97.9 F (36.6 C)  SpO2: 96% 99%   13.  Hypothyroidism.  Synthroid 14.  Hyperlipidemia.  Zetia/Crestor 15.  GERD.  Protonix  LOS: 4 days A FACE TO FACE EVALUATION WAS PERFORMED  Meredith Staggers 12/31/2019, 9:24 AM

## 2020-01-01 ENCOUNTER — Inpatient Hospital Stay (HOSPITAL_COMMUNITY): Payer: Medicare Other | Admitting: Occupational Therapy

## 2020-01-01 ENCOUNTER — Inpatient Hospital Stay (HOSPITAL_COMMUNITY): Payer: Medicare Other | Admitting: Physical Therapy

## 2020-01-01 LAB — GLUCOSE, CAPILLARY
Glucose-Capillary: 139 mg/dL — ABNORMAL HIGH (ref 70–99)
Glucose-Capillary: 170 mg/dL — ABNORMAL HIGH (ref 70–99)
Glucose-Capillary: 215 mg/dL — ABNORMAL HIGH (ref 70–99)
Glucose-Capillary: 271 mg/dL — ABNORMAL HIGH (ref 70–99)

## 2020-01-01 NOTE — Progress Notes (Signed)
Physical Therapy Session Note  Patient Details  Name: [name] MRN: [id number] Date of Birth: [birth year]  Today's Date: 01/01/2020 PT Individual Time: 1020-1120 PT Individual Time Calculation (min): 60 min   Short Term Goals: Week 1:  PT Short Term Goal 1 (Week 1): Pt will ambulate with min assist 118f with LRAD PT Short Term Goal 2 (Week 1): [facility] Short Term Goal 3 (Week 1): Pt will ascend 1 step with RW and min assist  Skilled Therapeutic Interventions/Progress Updates: Pt presented in recliner agreeable to therapy. Pt states no pain but continues to have numbness in hands and face. Pt donned shoes with supervision from recliner. Performed ambulatory transfer to w/c x 172fwith RW and CGA with increased time. Pt transported to rehab gym for energy conservation. Performed ambulatory transfer to high/low mat. Participated on STS2 x 5 no AD for balance and BLE strengthening. Pt also performed toe taps to 4in step 2 x 10 bilaterally for wt shifting and coordination. Participated in standing on [employer] while reaching and forced use of RUE by placing yellow clothespins on stand first bouts with RW then second without RW. Pt also participated in  Gait training [id number] RW and CGA to close S with verbal cues for increased heel strike. After seated rest pt ambulated back to room >[id number] RW and CGA to close S with pt only noting decreased R foot clearance with fatigue. Pt returned to recliner at end of session and left with seat alarm on, call bell within reach and needs met.      Therapy Documentation Precautions:  Precautions Precautions: Fall Restrictions Weight Bearing Restrictions: No General:   Vital Signs: Therapy Vitals Temp: 97.6 F (36.4 C) Pulse Rate: (!) 102 Resp: 18 BP: 134/75 Patient Position (if appropriate): Sitting Oxygen Therapy SpO2: 97 % O2 Device: Room Air Pain:   Mobility:   Locomotion :    Trunk/Postural  Assessment :    Balance:   Exercises:   Other Treatments:      Therapy/Group: Individual Therapy  Sarim Rothman 01/01/2020, 4:07 PM

## 2020-01-01 NOTE — Progress Notes (Signed)
Occupational Therapy Session Note  Patient Details  Name: [name] MRN: [id number] Date of Birth: 1950/09/04  Today's Date: 01/01/2020 OT Individual Time: [id number] Individual Time Calculation (min): 70 min    Short Term Goals: Week 1:  OT Short Term Goal 1 (Week 1): Pt will perform toileting with min A overall. OT Short Term Goal 2 (Week 1): Pt will standing for grooming tasks with min guard for balance. OT Short Term Goal 3 (Week 1): Pt will perform shower transfer with min A overall. OT Short Term Goal 4 (Week 1): Pt will perform LB dressing with min A overall.  Skilled Therapeutic Interventions/Progress Updates:     Upon entering the room, pt supine in bed and requests to use bathroom and shower this session. Pt performed bed mobility with supervision. Close supervision for short distance ambulation with RW into bathroom. Pt performed hygiene and clothing management to doff clothing items while seated on commode for safety. Pt transferred to walk in shower with shower chair with CGA. Pt bathing while seated on shower chair and standing briefly with CGA to wash buttocks and peri area. Pt drying and exiting the bathroom to obtain clothing items from dresser with CGA for safety with balance. Pt returning to sit on EOB for dressing tasks. Pt was able to utilize R UE to wash L arm, apply lotion, and deodorant this session! Pt needing multiple rest breaks secondary to fatigue with self care tasks. Pt needing assistance to don B socks and was unable to utilize figure four position. Plans to try foot stool or sock aide next session. Pt ambulating with close supervision 10' with RW to recliner chair. Chair alarm activated. Call bell and all needed items within reach.    Therapy Documentation Precautions:  Precautions Precautions: Fall Restrictions Weight Bearing Restrictions: No General:   Vital Signs:  Pain: Pain Assessment Pain Scale: 0-10 Pain Score: 0-No  pain   Therapy/Group: Individual Therapy  Gypsy Decant 01/01/2020, 10:19 AM

## 2020-01-01 NOTE — Progress Notes (Signed)
Occupational Therapy Session Note  Patient Details  Name: [name] MRN: [id number] Date of Birth: 23-May-1951  Today's Date: 01/01/2020 OT Individual Time: 1300-1414 OT Individual Time Calculation (min): 74 min   Short Term Goals: Week 1:  OT Short Term Goal 1 (Week 1): Pt will perform toileting with min A overall. OT Short Term Goal 2 (Week 1): Pt will standing for grooming tasks with min guard for balance. OT Short Term Goal 3 (Week 1): Pt will perform shower transfer with min A overall. OT Short Term Goal 4 (Week 1): Pt will perform LB dressing with min A overall.  Skilled Therapeutic Interventions/Progress Updates:    Pt greeted in the recliner, just finished lunch and premedicated for pain. ADL needs were met. Started session by having pt practice doffing/donning shoes and doffing/donning gripper socks. Pt able to meet demands of these tasks herself without assistance. Discussed donning footwear in a supportive chair at home vs EOB to increase functional independence and safety. Pt agreeable, states she will don footwear while sitting on her sofa. Transitioned to IADL retraining. Discussed and demonstrated RW safety and ways to transport linen items using walker bag to meet demands of bedmaking tasks. Pt stripped and made up bed during session, working on dynamic standing balance, Rt NMR, bimanual coordination, and activity tolerance in the process. Min vcs for increasing functional use of Rt hand. Educated pt on energy conservation techniques throughout. We also discussed how the adaptive strategies reviewed today can be applied to laundry. She reports her dryer is elevated so that she can easily reach the back of it without stooping. She will need to practice stooping to retrieve items out of washing machine. We problem solved using the hamper, advising instead to carry small loads of soiled linen using walker bag and separating laundry near machine for easy loading. Pt receptive to IADL  education, required frequent seated rest breaks due to fatigue. Ambulatory transfer to toilet completed with close supervision assist using device at ambulatory level. Supervision for toileting tasks with pt having +bladder void. Throughout session pt ambulating and sidestepping with RW and close supervision assistance. At end of session pt remained sitting in the recliner with all needs within reach and chair alarm set.   Pt also reported using a reacher at home to pick things up from floor. Provided her with a reacher bag for the walker. Pt appreciative.    Therapy Documentation Precautions:  Precautions Precautions: Fall Restrictions Weight Bearing Restrictions: No Vital Signs: Therapy Vitals Temp: 97.6 F (36.4 C) Pulse Rate: (!) 102 Resp: 18 BP: 134/75 Patient Position (if appropriate): Sitting Oxygen Therapy SpO2: 97 % O2 Device: Room Air ADL:       Therapy/Group: Individual Therapy  Metha Kolasa A Vernica Wachtel 01/01/2020, 4:01 PM

## 2020-01-01 NOTE — Progress Notes (Signed)
[facility] PHYSICAL MEDICINE & REHABILITATION PROGRESS NOTE   Subjective/Complaints:   No issues overnite   Still numb in RIght arm no other c/os, no progressive LE weakness  ROS: Patient denies CP, SOB, N/V/D Objective:   No results found. No results for input(s): WBC, HGB, HCT, PLT in the last 72 hours. No results for input(s): NA, K, CL, CO2, GLUCOSE, BUN, CREATININE, CALCIUM in the last 72 hours.  Intake/Output Summary (Last 24 hours) at 01/01/2020 0815 Last data filed at 01/01/2020 0743 Gross per 24 hour  Intake 682 ml  Output --  Net 682 ml     Physical Exam: Vital Signs Blood pressure (!) 131/54, pulse 84, temperature 98 F (36.7 C), temperature source Oral, resp. rate 18, height 5\' 3"  (1.6 m), weight 81.6 kg, SpO2 98 %.    General: No acute distress Mood and affect are appropriate Heart: Regular rate and rhythm no rubs murmurs or extra sounds Lungs: Clear to auscultation, breathing unlabored, no rales or wheezes Abdomen: Positive bowel sounds, soft nontender to palpation, nondistended Extremities: No clubbing, cyanosis, or edema Skin: No evidence of breakdown, no evidence of rash  3-/5 right deltoid bicep tricep grip 3-/5 right hip flexor knee extensor to minus right ankle dorsiflexor Decreased LT right face/arm > R foot  Musculoskeletal: Full passive range of motion in all 4 extremities. No joint swelling   Assessment/Plan: 1. Functional deficits secondary to left thalamic infarct which require 3+ hours per day of interdisciplinary therapy in a comprehensive inpatient rehab setting.  Physiatrist is providing close team supervision and 24 hour management of active medical problems listed below.  Physiatrist and rehab team continue to assess barriers to discharge/monitor patient progress toward functional and medical goals  Care Tool:  Bathing    Body parts bathed by patient: Right arm, Chest, Abdomen, Right upper leg, Left upper leg, Face, Front perineal  area, Buttocks, Right lower leg, Left lower leg, Left arm   Body parts bathed by helper: Left arm, Front perineal area, Buttocks, Right lower leg, Left lower leg     Bathing assist Assist Level: Contact Guard/Touching assist     Upper Body Dressing/Undressing Upper body dressing   What is the patient wearing?: Pull over shirt, Bra    Upper body assist Assist Level: Contact Guard/Touching assist    Lower Body Dressing/Undressing Lower body dressing      What is the patient wearing?: Underwear/pull up, Pants     Lower body assist Assist for lower body dressing: Minimal Assistance - Patient > 75%     Toileting Toileting    Toileting assist Assist for toileting: Supervision/Verbal cueing     Transfers Chair/bed transfer  Transfers assist     Chair/bed transfer assist level: Supervision/Verbal cueing     Locomotion Ambulation   Ambulation assist      Assist level: Supervision/Verbal cueing Assistive device: Walker-rolling Max distance: 130'   Walk 10 feet activity   Assist     Assist level: Moderate Assistance - Patient - 50 - 74% Assistive device: Walker-rolling   Walk 50 feet activity   Assist Walk 50 feet with 2 turns activity did not occur: Safety/medical concerns  Assist level: Supervision/Verbal cueing Assistive device: Walker-rolling    Walk 150 feet activity   Assist Walk 150 feet activity did not occur: Safety/medical concerns         Walk 10 feet on uneven surface  activity   Assist Walk 10 feet on uneven surfaces activity did not occur: Safety/medical  concerns   Assist level: Supervision/Verbal cueing Assistive device: Aeronautical engineer   Type of Wheelchair: Manual    Wheelchair assist level: Moderate Assistance - Patient 50 - 74% Max wheelchair distance: 50    Wheelchair 50 feet with 2 turns activity    Assist        Assist Level: Moderate Assistance - Patient 50 - 74%    Wheelchair 150 feet activity     Assist      Assist Level: Maximal Assistance - Patient 25 - 49%   Blood pressure (!) 131/54, pulse 84, temperature 98 F (36.7 C), temperature source Oral, resp. rate 18, height 5\' 3"  (1.6 m), weight 81.6 kg, SpO2 98 %.    Medical Problem List and Plan: 1.  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  Antithrombotics: -DVT/anticoagulation: Lovenox             -antiplatelet therapy: Aspirin 81 mg daily and Plavix 75 mg daily x3 weeks then aspirin alone 3. Pain Management: Lidoderm patch, Voltaren gel as needed  No HA, monitor 4. Mood: Provide emotional support             -antipsychotic agents: N/A 5. Neuropsych: This patient is capable of making decisions on her own behalf. 6. Skin/Wound Care: Routine skin checks 7. Fluids/Electrolytes/Nutrition: Routine in and outs.  CMP ordered. 8.  Bilateral carotid stenosis.  Follow-up outpatient vascular as outpatient. 9.  Myasthenia gravis.  Continue Cellcept 1500  prednisone 10 mg daily.  She is followed at [facility] and also receives IVIG at home.  If patient fails progressive bilateral lower extremity weakness may need to perform IVIG infusion in the hospital 10.  Diabetes mellitus with hyperglycemia.  Hemoglobin A1c 9.1.  NovoLog 15 units 3 times daily, Levemir 35 units daily.  Check blood sugars before meals and at bedtime.            CBG (last 3)  Recent Labs    12/31/19 1640 12/31/19 2120 01/01/20 0610  GLUCAP 209* 111* 139*   -5/10 controlled  11.  OSA/asthma.  Singulair 10 mg daily, continue inhalers as directed as well as CPAP.             Monitor with increased exertio n  CPAP resumed 5/8 with improved sleep 12.Hypertension.  Avapro 150 mg daily, Aldactone 25 mg daily.    Vitals:   12/31/19  1840 01/01/20 0609  BP: (!) 111/49 (!) 131/54  Pulse: 92 84  Resp: 16 18  Temp: 98.3 F (36.8 C) 98 F (36.7 C)  SpO2: 97% 98%  5/10 controlled  13.  Hypothyroidism.  Synthroid 14.  Hyperlipidemia.  Zetia/Crestor 15.  GERD.  Protonix  LOS: 5 days A FACE TO FACE EVALUATION WAS PERFORMED  Charlett Blake 01/01/2020, 8:15 AM

## 2020-01-02 ENCOUNTER — Inpatient Hospital Stay (HOSPITAL_COMMUNITY): Payer: Medicare Other | Admitting: Occupational Therapy

## 2020-01-02 ENCOUNTER — Inpatient Hospital Stay (HOSPITAL_COMMUNITY): Payer: Medicare Other | Admitting: Physical Therapy

## 2020-01-02 LAB — GLUCOSE, CAPILLARY
Glucose-Capillary: 195 mg/dL — ABNORMAL HIGH (ref 70–99)
Glucose-Capillary: 186 mg/dL — ABNORMAL HIGH (ref 70–99)
Glucose-Capillary: 257 mg/dL — ABNORMAL HIGH (ref 70–99)
Glucose-Capillary: 280 mg/dL — ABNORMAL HIGH (ref 70–99)

## 2020-01-02 NOTE — Progress Notes (Signed)
Pt c/o front/Left headache described as "thobbing/pounding". Pt refused the PRN tylenol

## 2020-01-02 NOTE — Progress Notes (Signed)
Home PPV in use. 

## 2020-01-02 NOTE — Progress Notes (Addendum)
Occupational Therapy Session Note  Patient Details  Name: [name] MRN: [id number] Date of Birth: Sep 25, 1950  Today's Date: 01/02/2020 OT Individual Time: 0847-1000 OT Individual Time Calculation (min): 73 min    Short Term Goals: Week 1:  OT Short Term Goal 1 (Week 1): Pt will perform toileting with min A overall. OT Short Term Goal 2 (Week 1): Pt will standing for grooming tasks with min guard for balance. OT Short Term Goal 3 (Week 1): Pt will perform shower transfer with min A overall. OT Short Term Goal 4 (Week 1): Pt will perform LB dressing with min A overall.  Skilled Therapeutic Interventions/Progress Updates:    Upon entering the room, pt supine in bed with EKG being done. OT setting up room for self care session per pt request. Pt also reports headache all morning and took pain medication prior to OT arrival. Pt performed bed mobility with supervision. Pt ambulates with RW and CGA onto bathroom for toileting need with CGA for balance duing LB clothing management and hygiene. Pt exiting the bathroom and engaged in bathing tasks with sit <>Stand from wheelchair at sink with S - CGA for balance. Pt able to thread pants onto B LEs with CGA for sitting balance. Pt needing assistance to fasten bra this session. Pt ambulating with RW and CGA to recliner chair. Pt able to don B socks from this chair height with increased time and min cuing for technique. PT engaged in R UE coordination and strengthening exercises with use of tan, soft resistive theraputty with min cuing for technique.  Pt remained seated in wheelchair with chair alarm belt donned and call bell within reach.   Therapy Documentation Precautions:  Precautions Precautions: Fall Restrictions Weight Bearing Restrictions: No   Therapy/Group: Individual Therapy  Gypsy Decant 01/02/2020, 12:43 PM

## 2020-01-02 NOTE — Progress Notes (Signed)
Physical Therapy Session Note  Patient Details  Name: [name] MRN: [id number] Date of Birth: 05-Aug-1951  Today's Date: 01/02/2020 PT Individual Time: 1110-1208 and 1415 - 1530 PT Individual Time Calculation (min): 58 min and 75 min  Short Term Goals: Week 1:  PT Short Term Goal 1 (Week 1): Pt will ambulate with min assist 132f with LRAD PT Short Term Goal [facility] (Week [facility]): [facility] Short Term Goal 3 (Week 1): Pt will ascend 1 step with RW and min assist  Skilled Therapeutic Interventions/Progress Updates:Tx1: Pt presented in recliner agreeable to therapy. Pt states that was a bit concerned about elevated HR and that received stat EKG this am. Discussed performing low level activities this am until results from EKG in with pt in agreement. Performed ambulatory transfer recliner to w/c with RW and supervision. Pt doffed socks and donned shoes with set up while sitting in w/c. PTA transported pt to rehab gym for energy conservation. Performed ambulatory transfer to mat in same manner as prior. Participated laundry folding (washcloths/pillow cases, and towels) in sitting for forced use of RUE. Pt then performed same activity while standing on red wedge for balance challenge. With min cues pt was able to maintain fair static standing balance and fold items with slight B knee flexion noted with fatigue. Performed toe taps to cones with RW for coordination and wt shifting with pt able to control tap and not knock down any cones. Participated in gait in obstacle course 460fx 2 including weaving through cones of varying widths and stepping over hockey sticks. Pt was able to perform with supervision however noted decreased control/heel strike with RLE with fatigue. Pt returned to w/c and transported back to room at end of session with pt performing ambulatory transfer back to recliner with RW and supervision. Pt left in recliner at end of session with seat alarm on,  call bell within reach and needs met.   Tx2: Pt presented in recliner agreeable to therapy. Denies pain during session. Session focused on R NMR via forced use and gait. Performed ambulatory transfer to w/c wth RW and supervision. Pt transported to day room for energy conservation. Performed ambulatory transfer to [employer] with RW and CGA. Participated in standing march on [employer] for forced use of RLE with increased recruitment of hamstrings and quads. Performed 3 x 10 bouts of marching at 50 cm/sec with mirror feedback with ptp maintaining midline and cues for pushing through full range with RLE. Pt then participated in STS in [employer] with pt demonstrating improvement in maintaining equal wt bearing. Participated in gait training [id number] RW and supervision with verbal cues for improved heel strike and quad activation in TKE. With increased distance pt was able to demonstrate significantly improved gait mechanics with heel strike and toe off and able to maintain without cues. Pt then transported to rehab gym and participated in BLE strengthening performing  Forward and lateral step ups at 3in steps 2 x 5. Emphasis placed on increased use of LE vs pushing on rail. Pt then ascended/descended x 4 6in steps with B rails and CGA. Pt then transported to nsg station and ambulated back to room with RW and supervision with good carryover of heel strike and decreased sway noted. Pt returned to recliner at end of session and left with seat alarm on, call bell within reach and needs met.      Therapy Documentation Precautions:  Precautions Precautions:  Fall Restrictions Weight Bearing Restrictions: No General:   Vital Signs:   Pain:   Mobility:   Locomotion :    Trunk/Postural Assessment :    Balance:   Exercises:   Other Treatments:      Therapy/Group: Individual Therapy  Valentin Benney 01/02/2020, 12:48 PM

## 2020-01-02 NOTE — Progress Notes (Signed)
[place] PHYSICAL MEDICINE & REHABILITATION PROGRESS NOTE   Subjective/Complaints:   No issues overnite  ROS: Patient denies CP, SOB, N/V/D Objective:   No results found. No results for input(s): WBC, HGB, HCT, PLT in the last 72 hours. No results for input(s): NA, K, CL, CO2, GLUCOSE, BUN, CREATININE, CALCIUM in the last 72 hours.  Intake/Output Summary (Last 24 hours) at 01/02/2020 0801 Last data filed at 01/02/2020 0745 Gross per 24 hour  Intake 560 ml  Output --  Net 560 ml     Physical Exam: Vital Signs Blood pressure 139/63, pulse 88, temperature 98.3 F (36.8 C), temperature source Oral, resp. rate 18, height 5\' 3"  (1.6 m), weight 81.6 kg, SpO2 98 %.    3-/5 right deltoid bicep tricep grip 3-/5 right hip flexor knee extensor to minus right ankle dorsiflexor Decreased LT right face/arm > R foot  Musculoskeletal: Full passive range of motion in all 4 extremities. No joint swelling   Assessment/Plan: 1. Functional deficits secondary to left thalamic infarct which require 3+ hours per day of interdisciplinary therapy in a comprehensive inpatient rehab setting.  Physiatrist is providing close team supervision and 24 hour management of active medical problems listed below.  Physiatrist and rehab team continue to assess barriers to discharge/monitor patient progress toward functional and medical goals  Care Tool:  Bathing    Body parts bathed by patient: Right arm, Chest, Abdomen, Right upper leg, Left upper leg, Face, Front perineal area, Buttocks, Right lower leg, Left lower leg, Left arm   Body parts bathed by helper: Left arm, Front perineal area, Buttocks, Right lower leg, Left lower leg     Bathing assist Assist Level: Contact Guard/Touching assist     Upper Body Dressing/Undressing Upper body dressing   What is the patient wearing?: Pull over shirt    Upper body assist Assist Level: Supervision/Verbal cueing    Lower Body Dressing/Undressing Lower  body dressing      What is the patient wearing?: Underwear/pull up, Pants     Lower body assist Assist for lower body dressing: Contact Guard/Touching assist     Toileting Toileting    Toileting assist Assist for toileting: Supervision/Verbal cueing     Transfers Chair/bed transfer  Transfers assist     Chair/bed transfer assist level: Supervision/Verbal cueing     Locomotion Ambulation   Ambulation assist      Assist level: Contact Guard/Touching assist Assistive device: Walker-rolling Max distance: 131ft   Walk 10 feet activity   Assist     Assist level: Contact Guard/Touching assist Assistive device: Walker-rolling   Walk 50 feet activity   Assist Walk 50 feet with 2 turns activity did not occur: Safety/medical concerns  Assist level: Contact Guard/Touching assist Assistive device: Walker-rolling    Walk 150 feet activity   Assist Walk 150 feet activity did not occur: Safety/medical concerns  Assist level: Contact Guard/Touching assist Assistive device: Walker-rolling    Walk 10 feet on uneven surface  activity   Assist Walk 10 feet on uneven surfaces activity did not occur: Safety/medical concerns   Assist level: Supervision/Verbal cueing Assistive device: Aeronautical engineer   Type of Wheelchair: Manual    Wheelchair assist level: Moderate Assistance - Patient 50 - 74% Max wheelchair distance: 50    Wheelchair 50 feet with 2 turns activity    Assist        Assist Level: Moderate Assistance - Patient 50 - 74%   Wheelchair  150 feet activity     Assist      Assist Level: Maximal Assistance - Patient 25 - 49%   Blood pressure 139/63, pulse 88, temperature 98.3 F (36.8 C), temperature source Oral, resp. rate 18, height 5\' 3"  (1.6 m), weight 81.6 kg, SpO2 98 %.    Medical Problem List and Plan: 1.  Right side weakness secondary to left thalamic infarction secondary to small vessel  disease             -patient may shower             -ELOS/Goals: 10-14 days/supervision/Mod I             Team conf in am  2.  Antithrombotics: -DVT/anticoagulation: Lovenox             -antiplatelet therapy: Aspirin 81 mg daily and Plavix 75 mg daily x3 weeks then aspirin alone 3. Pain Management: Lidoderm patch, Voltaren gel as needed  No HA, monitor 4. Mood: Provide emotional support             -antipsychotic agents: N/A 5. Neuropsych: This patient is capable of making decisions on her own behalf. 6. Skin/Wound Care: Routine skin checks 7. Fluids/Electrolytes/Nutrition: Routine in and outs.  CMP ordered. 8.  Bilateral carotid stenosis.  Follow-up outpatient vascular as outpatient. 9.  Myasthenia gravis.  Continue Cellcept 1500  prednisone 10 mg daily.  She is followed at [facility] and also receives IVIG at home.  If patient fails progressive bilateral lower extremity weakness may need to perform IVIG infusion in the hospital 10.  Diabetes mellitus with hyperglycemia.  Hemoglobin A1c 9.1.  NovoLog 15 units 3 times daily, Levemir 35 units daily.  Check blood sugars before meals and at bedtime.            CBG (last 3)  Recent Labs    01/01/20 1638 01/01/20 2128 01/02/20 0607  GLUCAP 215* 271* 195*   -5/11  11.  OSA/asthma.  Singulair 10 mg daily, continue inhalers as directed as well as CPAP.             Monitor with increased exertio n  CPAP resumed 5/8 with improved sleep 12.Hypertension.  Avapro 150 mg daily, Aldactone 25 mg daily.    Vitals:   01/01/20 1915 01/02/20 0532  BP: (!) 129/55 139/63  Pulse: (!) 104 88  Resp: 18 18  Temp: 98 F (36.7 C) 98.3 F (36.8 C)  SpO2: 99% 98%  5/11 controlled  13.  Hypothyroidism.  Synthroid 14.  Hyperlipidemia.  Zetia/Crestor 15.  GERD.  Protonix  LOS: 6 days A FACE TO FACE EVALUATION WAS PERFORMED  Charlett Blake 01/02/2020, 8:01 AM

## 2020-01-03 ENCOUNTER — Inpatient Hospital Stay (HOSPITAL_COMMUNITY): Payer: Medicare Other | Admitting: Physical Therapy

## 2020-01-03 ENCOUNTER — Inpatient Hospital Stay (HOSPITAL_COMMUNITY): Payer: Medicare Other

## 2020-01-03 ENCOUNTER — Inpatient Hospital Stay (HOSPITAL_COMMUNITY): Payer: Medicare Other | Admitting: Occupational Therapy

## 2020-01-03 LAB — GLUCOSE, CAPILLARY
Glucose-Capillary: 145 mg/dL — ABNORMAL HIGH (ref 70–99)
Glucose-Capillary: 269 mg/dL — ABNORMAL HIGH (ref 70–99)
Glucose-Capillary: 260 mg/dL — ABNORMAL HIGH (ref 70–99)
Glucose-Capillary: 270 mg/dL — ABNORMAL HIGH (ref 70–99)

## 2020-01-03 MED ORDER — CANAGLIFLOZIN 100 MG PO TABS
100.0000 mg | ORAL_TABLET | Freq: Every day | ORAL | Status: DC
  Administered 2020-01-04 – 2020-01-09 (×6): 100 mg via ORAL
  Filled 2020-01-03 (×6): qty 1

## 2020-01-03 MED ORDER — LORATADINE 10 MG PO TABS
10.0000 mg | ORAL_TABLET | Freq: Every day | ORAL | Status: DC
  Administered 2020-01-04 – 2020-01-08 (×5): 10 mg via ORAL
  Filled 2020-01-03 (×5): qty 1

## 2020-01-03 NOTE — Progress Notes (Signed)
Physical Therapy Session Note  Patient Details  Name: [name] MRN: [id number] Date of Birth: [birth year]  Today's Date: 01/03/2020 PT Individual Time: 1048-1130 PT Individual Time Calculation (min): 42 min   Short Term Goals: Week 1:  PT Short Term Goal 1 (Week 1): Pt will ambulate with min assist 169ft with LRAD PT Short Term Goal 2 (Week 1): Pt will transfer to [facility] with supervision assist PT Short Term Goal 3 (Week 1): Pt will ascend 1 step with RW and min assist  Skilled Therapeutic Interventions/Progress Updates: Pt presents sitting in recliner and agreeable to therapy.  Pt performed multiple sit to stand transfers from recliner or arm chair w/ supervision.  Pt amb multiple trials w/ RW and supervision, although does fatigue, requiring increased verbal cues.  Pt amb up to 120' including turns to return to seat.  Pt peformed standing reaching and stacking cones w/ occasional UE support on table.  Pt standing w/ decreased BOS and reaching across midline to each side, especially using BUE simultaneously.  Pt performed reaching to varying heights w/o LOB.  Pt returned to recliner w/ seat alarm on and all needs in place.     Therapy Documentation Precautions:  Precautions Precautions: Fall Restrictions Weight Bearing Restrictions: No General:   Vital Signs: Therapy Vitals Temp: 98 F (36.7 C) Pulse Rate: 98 Resp: 20 BP: 117/77 Patient Position (if appropriate): Sitting Oxygen Therapy SpO2: 98 % O2 Device: Room Air Pain:   Pt states HA which is constant.    Therapy/Group: Individual Therapy  Ladoris Gene 01/03/2020, 2:48 PM

## 2020-01-03 NOTE — Progress Notes (Signed)
Home CPAP in use 

## 2020-01-03 NOTE — Progress Notes (Signed)
Team Conference Report to Patient/Family  Team Conference discussion was reviewed with the patient, including goals, any changes in plan of care and target discharge date.  Patient express understanding and is in agreement.  The patient has a target discharge date of 01/09/20  . Patient stated her headaches has not been addressed.  [name] 01/03/2020, 2:36 PM

## 2020-01-03 NOTE — Patient Care Conference (Signed)
Inpatient RehabilitationTeam Conference and Plan of Care Update Date: 01/03/2020   Time: 3:02 PM    Patient Name: [name]      Medical Record Number: [id number]  Date of Birth: 11-Aug-1951 Sex: Female         Room/Bed: 4W09C/4W09C-01 Payor Info: Payor: [employer] MEDICARE / Plan: [facility] MEDICARE / Product Type: *No Product type* /    Admit Date/Time:  12/27/2019  6:35 PM  Primary Diagnosis:  Thalamic stroke [facility])  Patient Active Problem List   Diagnosis Date Noted  . 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[place]) 12/27/2019  . Essential hypertension   . Uncomplicated asthma   . Uncontrolled type 2 diabetes mellitus with hyperglycemia ([place])   . Thalamic stroke ([place]) 12/20/2019  . Asymptomatic carotid artery stenosis, bilateral 10/08/2018  . Hypercholesterolemia 10/08/2018  . Headache around the eyes 11/12/2016  . Cervicalgia of occipito-atlanto-axial region 11/12/2016  . Moderate persistent asthma 07/29/2015  . Current use of beta blocker 07/29/2015  . Gastroesophageal reflux disease without esophagitis 07/29/2015  . OSA on CPAP 09/19/2014  . Diabetic neuropathy with neurologic complication ([place]) [phone]  . Myasthenia gravis in remission ([place]) 09/19/2014  . Dyspnea 08/13/2014  . Erb-Goldflam disease ([place]) 07/27/2014  . Cancer of thyroid ([place]) 07/27/2014  . OSA (obstructive sleep apnea) 07/24/2014  . DM (diabetes mellitus) type II uncontrolled with eye manifestation ([place]) 06/01/2014  . 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 Nocturia more than twice per night 06/01/2014  . Snoring 06/01/2014  . Insomnia due to medical condition 06/01/2014  . Neuroma 01/16/2014  . Pseudoclaudication 11/14/2013  . Heart palpitations 06/30/2013  . Sinus tachycardia 03/29/2013  . Myasthenia gravis ([place]) 03/13/2013  . Essential hypertension, benign 03/13/2013  . Asthma, chronic 03/13/2013    Expected Discharge Date: Expected Discharge Date: 01/09/20  Team Members Present: Physician leading  conference: Dr. Alysia Penna Care Coodinator Present: Erlene Quan, BSW;Sharisse Rantz Tobin Chad, RN, BSN, [place] Nurse Present: Rayne Du, LPN PT Present: Phylliss Bob, PTA OT Present: Darleen Crocker, OT PPS Coordinator present : Ileana Ladd, Burna Mortimer, SLP     Current Status/Progress Goal Weekly Team Focus  Bowel/Bladder   Pt is continent of b/b.LBM 12/31/19  Pt will remain continent of b/b  Q2h toileting, Remain with normal bowel pattern   Swallow/Nutrition/ Hydration             ADL's   supervision - CGA for balance and functional transfers with use of RW  mod I ovreall with supervision for shower transfers  NMR, balance, self care retraining, balance, functional transfers   Mobility   CGA/supervision bed mobilty, CGA transfers, CGA gait with RW on level tile and compliant surfaces, low endurance threshold  Mod I  balance, endurance, transfers, gait   Communication             Safety/Cognition/ Behavioral Observations            Pain   Pt c/o headache. Pt refused suggested the medication from the provider. Pt agreed to take PRN tylenol  pt will be pain free  assess pain qshift/prn   Skin   Pt has no obvious signs of skin breakdown. Pt has Port in the RU chest  Pt will remain free from breakdown and infection  Assess skin qshift/prn    Rehab Goals Patient on target to meet rehab goals: Yes Rehab Goals Revised: Patient on target with current set goals *See Care Plan and progress notes for long and short-term goals.     Barriers to  Discharge  Current Status/Progress Possible Resolutions Date Resolved   Nursing        on target for discharge          PT          on target for discharge          OT        on target for discharge          SLP               SW Medical stability Sw not aware of any barriers currently On target          Discharge Planning/Teaching Needs:  Patient plans to discharge home independent  Will schedule if recommended   Team  Discussion:  Reviewed Supervision - CGA  With MOD I goals  Revisions to Treatment Plan:  None    Medical Summary Current Status: No exacerbation of myasthenia gravis, still has right-sided weakness.  Blood sugars are elevated Weekly Focus/Goal: Diabetic control will adjust diabetic medications  Barriers to Discharge: Medical stability   Possible Resolutions to Barriers: See above   Continued Need for Acute Rehabilitation Level of Care: The patient requires daily medical management by a physician with specialized training in physical medicine and rehabilitation for the following reasons: Direction of a multidisciplinary physical rehabilitation program to maximize functional independence : Yes Medical management of patient stability for increased activity during participation in an intensive rehabilitation regime.: Yes Analysis of laboratory values and/or radiology reports with any subsequent need for medication adjustment and/or medical intervention. : Yes   I attest that I was present, lead the team conference, and concur with the assessment and plan of the team.   Dorien Chihuahua B 01/03/2020, 3:02 PM

## 2020-01-03 NOTE — Progress Notes (Signed)
Physical Therapy Session Note  Patient Details  Name: [name] MRN: [id number] Date of Birth: [birth year]  Today's Date: 01/03/2020 PT Individual Time: 0805-0900 PT Individual Time Calculation (min): 55 min   Short Term Goals: Week 1:  PT Short Term Goal 1 (Week 1): Pt will ambulate with min assist 131f with LRAD PT Short Term Goal 2 (Week 1): [facility] PT Short Term Goal 3 (Week 1): Pt will ascend 1 step with RW and min assist  Skilled Therapeutic Interventions/Progress Updates: Pt presented sitting EOB agreeable to therapy. Pt denies pain during session. Donned shoes with set up and performed ambulatory transfer to w/c CGA. Pt transported to rehab gym for energy conservation and performed stand pivot no AD CGA to mat. Participated in clothespin activity reaching and placing on basketball net for forced use of RUE with pt able to pinch both yellow and red pins this session. Pt then removed clothespins with RUE with LLE on 4in step for increased balance challenge. Pt returned to w/c in same manner as prior and transported to day room. Participated in gait/balance without AD ambulating bouts of 30 x 1 and 472fx 3 with minA progressing to CGA. Required cues for step length and improved hip stabilization with pt was able to maintain after cues. Pt ambulated to NuStep and participated in NuStep L1 x 5 min for global conditioning. Performed ambulatory transfer to w/c supervision and transferred back to room. Pt ambulated door to recliner with RW and supervision and pt left at recliner at end of session with seat alarm on, call bell within reach and needs met.      Therapy Documentation Precautions:  Precautions Precautions: Fall Restrictions Weight Bearing Restrictions: No General:   Vital Signs: Therapy Vitals Temp: 98 F (36.7 C) Pulse Rate: 98 Resp: 20 BP: 117/77 Patient Position (if appropriate): Sitting Oxygen Therapy SpO2: 98 % O2 Device:  Room Air   Therapy/Group: Individual Therapy  Elnor Renovato  Yarielis Funaro, PTA  01/03/2020, 3:58 PM

## 2020-01-03 NOTE — Progress Notes (Signed)
[place] PHYSICAL MEDICINE & REHABILITATION PROGRESS NOTE   Subjective/Complaints:   No issue overnite , discussed elevated CBG No pain c/os Discussed purpose of team conf  ROS: Patient denies CP, SOB, N/V/D Objective:   No results found. No results for input(s): WBC, HGB, HCT, PLT in the last 72 hours. No results for input(s): NA, K, CL, CO2, GLUCOSE, BUN, CREATININE, CALCIUM in the last 72 hours.  Intake/Output Summary (Last 24 hours) at 01/03/2020 0810 Last data filed at 01/03/2020 0738 Gross per 24 hour  Intake 297 ml  Output --  Net 297 ml     Physical Exam: Vital Signs Blood pressure (!) 123/51, pulse 79, temperature 97.9 F (36.6 C), temperature source Oral, resp. rate 18, height '5\' 3"'  (1.6 m), weight 81.6 kg, SpO2 100 %.    General: No acute distress Mood and affect are appropriate Heart: Regular rate and rhythm no rubs murmurs or extra sounds Lungs: Clear to auscultation, breathing unlabored, no rales or wheezes Abdomen: Positive bowel sounds, soft nontender to palpation, nondistended Extremities: No clubbing, cyanosis, or edema Skin: No evidence of breakdown, no evidence of rash  3-/5 right deltoid bicep tricep grip 3-/5 right hip flexor knee extensor to minus right ankle dorsiflexor Decreased LT right face/arm > R foot  Musculoskeletal: Full passive range of motion in all 4 extremities. No joint swelling   Assessment/Plan: 1. Functional deficits secondary to left thalamic infarct which require 3+ hours per day of interdisciplinary therapy in a comprehensive inpatient rehab setting.  Physiatrist is providing close team supervision and 24 hour management of active medical problems listed below.  Physiatrist and rehab team continue to assess barriers to discharge/monitor patient progress toward functional and medical goals  Care Tool:  Bathing    Body parts bathed by patient: Right arm, Chest, Abdomen, Right upper leg, Left upper leg, Face, Front  perineal area, Buttocks, Left arm   Body parts bathed by helper: Right lower leg, Left lower leg     Bathing assist Assist Level: Minimal Assistance - Patient > 75%     Upper Body Dressing/Undressing Upper body dressing   What is the patient wearing?: Pull over shirt, Bra    Upper body assist Assist Level: Minimal Assistance - Patient > 75%    Lower Body Dressing/Undressing Lower body dressing      What is the patient wearing?: Underwear/pull up, Pants     Lower body assist Assist for lower body dressing: Contact Guard/Touching assist     Toileting Toileting    Toileting assist Assist for toileting: Supervision/Verbal cueing     Transfers Chair/bed transfer  Transfers assist     Chair/bed transfer assist level: Supervision/Verbal cueing     Locomotion Ambulation   Ambulation assist      Assist level: Contact Guard/Touching assist Assistive device: Walker-rolling Max distance: 121f   Walk 10 feet activity   Assist     Assist level: Contact Guard/Touching assist Assistive device: Walker-rolling   Walk 50 feet activity   Assist Walk 50 feet with 2 turns activity did not occur: Safety/medical concerns  Assist level: Contact Guard/Touching assist Assistive device: Walker-rolling    Walk 150 feet activity   Assist Walk 150 feet activity did not occur: Safety/medical concerns  Assist level: Contact Guard/Touching assist Assistive device: Walker-rolling    Walk 10 feet on uneven surface  activity   Assist Walk 10 feet on uneven surfaces activity did not occur: Safety/medical concerns   Assist level: Supervision/Verbal cueing Assistive device: Walker-rolling  Wheelchair     Assist   Type of Wheelchair: Manual    Wheelchair assist level: Moderate Assistance - Patient 50 - 74% Max wheelchair distance: 50    Wheelchair 50 feet with 2 turns activity    Assist        Assist Level: Moderate Assistance - Patient 50 - 74%    Wheelchair 150 feet activity     Assist      Assist Level: Maximal Assistance - Patient 25 - 49%   Blood pressure (!) 123/51, pulse 79, temperature 97.9 F (36.6 C), temperature source Oral, resp. rate 18, height '5\' 3"'  (1.6 m), weight 81.6 kg, SpO2 100 %.    Medical Problem List and Plan: 1.  Right side weakness secondary to left thalamic infarction secondary to small vessel disease             -patient may shower             -ELOS/Goals: 10-14 days/supervision/Mod I             Team conference today please see physician documentation under team conference tab, met with team  to discuss problems,progress, and goals. Formulized individual treatment plan based on medical history, underlying problem and comorbidities. 2.  Antithrombotics: -DVT/anticoagulation: Lovenox             -antiplatelet therapy: Aspirin 81 mg daily and Plavix 75 mg daily x3 weeks then aspirin alone 3. Pain Management: Lidoderm patch, Voltaren gel as needed  No HA, monitor 4. Mood: Provide emotional support             -antipsychotic agents: N/A 5. Neuropsych: This patient is capable of making decisions on her own behalf. 6. Skin/Wound Care: Routine skin checks 7. Fluids/Electrolytes/Nutrition: Routine in and outs.  CMP ordered. 8.  Bilateral carotid stenosis.  Follow-up outpatient vascular as outpatient. 9.  Myasthenia gravis.  Continue Cellcept 1500  prednisone 10 mg daily.  She is followed at [facility] and also receives IVIG at home.  If patient fails progressive bilateral lower extremity weakness may need to perform IVIG infusion in the hospital 10.  Diabetes mellitus with hyperglycemia.  Hemoglobin A1c 9.1.  NovoLog 15 units 3 times daily, Levemir 35 units daily.  Check blood sugars before meals and at bedtime.            CBG (last 3)  Recent Labs    01/02/20 1652 01/02/20 2117 01/03/20 0634  GLUCAP 257* 280* 145*  PM reading elevated will restart Invokana (home med)  11.  OSA/asthma.   Singulair 10 mg daily, continue inhalers as directed as well as CPAP.             Monitor with increased exertio n  CPAP resumed 5/8 with improved sleep 12.Hypertension.  Avapro 150 mg daily, Aldactone 25 mg daily.    Vitals:   01/02/20 2013 01/03/20 0534  BP: (!) 107/54 (!) 123/51  Pulse: 84 79  Resp: 18 18  Temp: 98.7 F (37.1 C) 97.9 F (36.6 C)  SpO2: 98% 100%  5/12 13.  Hypothyroidism.  Synthroid 14.  Hyperlipidemia.  Zetia/Crestor 15.  GERD.  Protonix  LOS: 7 days A FACE TO FACE EVALUATION WAS PERFORMED  Charlett Blake 01/03/2020, 8:10 AM

## 2020-01-03 NOTE — Progress Notes (Signed)
Occupational Therapy Session Note  Patient Details  Name: [name] MRN: [id number] Date of Birth: 05/15/1951  Today's Date: 01/03/2020 OT Individual Time: 1300-1400 OT Individual Time Calculation (min): 60 min    Short Term Goals: Week 1:  OT Short Term Goal 1 (Week 1): Pt will perform toileting with min A overall. OT Short Term Goal 2 (Week 1): Pt will standing for grooming tasks with min guard for balance. OT Short Term Goal 3 (Week 1): Pt will perform shower transfer with min A overall. OT Short Term Goal 4 (Week 1): Pt will perform LB dressing with min A overall.  Skilled Therapeutic Interventions/Progress Updates:    Upon entering the room, pt seated in recliner chair with no c/o pain and agreeable to OT intervention. Pt declined toileting this session and is agreeable to shower tomorrow. Pt ambulating 100' with RW and close supervision for safety into day room. Pt engaged in R UE NMR exercises with use of cards for palmar translation, dealing of deck, flipping, and selecting cards with increased time needed to complete task and min cuing to utilize R UE for task. Pt also attempting handwriting skills in cursive and print which was much better in print. Pt engaged in maze work as well with no errors noted but min cuing to slow rate of speed. Pt ambulating back to room in same manner to recliner chair. Call bell and all needed items within reach. Chair alarm activated.   Therapy Documentation Precautions:  Precautions Precautions: Fall Restrictions Weight Bearing Restrictions: No General:   Vital Signs: Therapy Vitals Temp: 98 F (36.7 C) Pulse Rate: 98 Resp: 20 BP: 117/77 Patient Position (if appropriate): Sitting Oxygen Therapy SpO2: 98 % O2 Device: Room Air   Therapy/Group: Individual Therapy  Gypsy Decant 01/03/2020, 4:40 PM

## 2020-01-03 NOTE — Progress Notes (Signed)
Pt c/o of headache. Pt states she has informed the previous shift and the provider, but she refused the medication. Pt states the medication would make her sleepy so she would not be taking the suggested medication. Pt appeared to be agitated that her headache was still present. Pt agreed to take the PRN tylenol

## 2020-01-03 NOTE — Progress Notes (Signed)
Occupational Therapy Session Note  Patient Details  Name: [name] MRN: [id number] Date of Birth: 09-29-1950  Today's Date: 01/03/2020 OT Individual Time: [id number] Individual Time Calculation (min): 28 min    Short Term Goals: Week 1:  OT Short Term Goal 1 (Week 1): Pt will perform toileting with min A overall. OT Short Term Goal 2 (Week 1): Pt will standing for grooming tasks with min guard for balance. OT Short Term Goal 3 (Week 1): Pt will perform shower transfer with min A overall. OT Short Term Goal 4 (Week 1): Pt will perform LB dressing with min A overall.  Skilled Therapeutic Interventions/Progress Updates:    1:1. Pt received in recliner agreeable to OT with no pain. Pt provided with cup of pennies, straws and beads. Pt able to pinch all items and place in cup with increased time and VC for decreased shoulder hike. Pt completes bead search in soft tan putty with extra time using vision to compensate for decreased sensation in fingertips. Pt able to use putty shaped like a doughnut around digits to complete 10 reps of digit extension with first 5 requiring MIN A to facilitate along ulnar nerve distribution and last 5 pt able to complete without A. Exited sessionw iht pt seated in recliner, exi tlaamr on and call light in reach  Therapy Documentation Precautions:  Precautions Precautions: Fall Restrictions Weight Bearing Restrictions: No General:   Vital Signs: Therapy Vitals Temp: 98 F (36.7 C) Pulse Rate: 98 Resp: 20 BP: 117/77 Patient Position (if appropriate): Sitting Oxygen Therapy SpO2: 98 % O2 Device: Room Air Pain:   ADL:   Vision   Perception    Praxis   Exercises:   Other Treatments:     Therapy/Group: Individual Therapy  Tonny Branch 01/03/2020, 4:09 PM

## 2020-01-04 ENCOUNTER — Inpatient Hospital Stay (HOSPITAL_COMMUNITY): Payer: Medicare Other | Admitting: *Deleted

## 2020-01-04 ENCOUNTER — Inpatient Hospital Stay (HOSPITAL_COMMUNITY): Payer: Medicare Other | Admitting: Occupational Therapy

## 2020-01-04 ENCOUNTER — Inpatient Hospital Stay (HOSPITAL_COMMUNITY): Payer: Medicare Other | Admitting: Physical Therapy

## 2020-01-04 LAB — GLUCOSE, CAPILLARY
Glucose-Capillary: 85 mg/dL (ref 70–99)
Glucose-Capillary: 165 mg/dL — ABNORMAL HIGH (ref 70–99)
Glucose-Capillary: 197 mg/dL — ABNORMAL HIGH (ref 70–99)
Glucose-Capillary: 92 mg/dL (ref 70–99)

## 2020-01-04 MED ORDER — INSULIN DETEMIR 100 UNIT/ML ~~LOC~~ SOLN
40.0000 [IU] | Freq: Every day | SUBCUTANEOUS | Status: DC
  Administered 2020-01-05 – 2020-01-07 (×3): 40 [IU] via SUBCUTANEOUS
  Filled 2020-01-04 (×3): qty 0.4

## 2020-01-04 NOTE — Progress Notes (Signed)
[place] PHYSICAL MEDICINE & REHABILITATION PROGRESS NOTE   Subjective/Complaints:   Patient states that at home she was on 300 mg of Invokana daily.  As discussed with patient she has been restarted 100 mg/day.  A.m. blood sugar low therefore Levemir was reduced.  The patient did take a snack at night  ROS: Patient denies CP, SOB, N/V/D Objective:   No results found. No results for input(s): WBC, HGB, HCT, PLT in the last 72 hours. No results for input(s): NA, K, CL, CO2, GLUCOSE, BUN, CREATININE, CALCIUM in the last 72 hours.  Intake/Output Summary (Last 24 hours) at 01/04/2020 0807 Last data filed at 01/04/2020 0746 Gross per 24 hour  Intake 682 ml  Output --  Net 682 ml     Physical Exam: Vital Signs Blood pressure (!) 106/44, pulse 78, temperature 98.3 F (36.8 C), resp. rate 18, height 5\' 3"  (1.6 m), weight 81.6 kg, SpO2 97 %.     General: No acute distress Mood and affect are appropriate Heart: Regular rate and rhythm no rubs murmurs or extra sounds Lungs: Clear to auscultation, breathing unlabored, no rales or wheezes Abdomen: Positive bowel sounds, soft nontender to palpation, nondistended Extremities: No clubbing, cyanosis, or edema Skin: No evidence of breakdown, no evidence of rash   3-/5 right deltoid bicep tricep grip 3-/5 right hip flexor knee extensor to minus right ankle dorsiflexor Decreased LT right face/arm > R foot  Musculoskeletal: Full passive range of motion in all 4 extremities. No joint swelling   Assessment/Plan: 1. Functional deficits secondary to left thalamic infarct which require 3+ hours per day of interdisciplinary therapy in a comprehensive inpatient rehab setting.  Physiatrist is providing close team supervision and 24 hour management of active medical problems listed below.  Physiatrist and rehab team continue to assess barriers to discharge/monitor patient progress toward functional and medical goals  Care Tool:  Bathing     Body parts bathed by patient: Right arm, Chest, Abdomen, Right upper leg, Left upper leg, Face, Front perineal area, Buttocks, Left arm   Body parts bathed by helper: Right lower leg, Left lower leg     Bathing assist Assist Level: Minimal Assistance - Patient > 75%     Upper Body Dressing/Undressing Upper body dressing   What is the patient wearing?: Pull over shirt, Bra    Upper body assist Assist Level: Minimal Assistance - Patient > 75%    Lower Body Dressing/Undressing Lower body dressing      What is the patient wearing?: Underwear/pull up, Pants     Lower body assist Assist for lower body dressing: Contact Guard/Touching assist     Toileting Toileting    Toileting assist Assist for toileting: Supervision/Verbal cueing     Transfers Chair/bed transfer  Transfers assist     Chair/bed transfer assist level: Supervision/Verbal cueing     Locomotion Ambulation   Ambulation assist      Assist level: Supervision/Verbal cueing Assistive device: Walker-rolling Max distance: 100'   Walk 10 feet activity   Assist     Assist level: Supervision/Verbal cueing Assistive device: Walker-rolling   Walk 50 feet activity   Assist Walk 50 feet with 2 turns activity did not occur: Safety/medical concerns  Assist level: Supervision/Verbal cueing Assistive device: Walker-rolling    Walk 150 feet activity   Assist Walk 150 feet activity did not occur: Safety/medical concerns  Assist level: Supervision/Verbal cueing Assistive device: Walker-rolling    Walk 10 feet on uneven surface  activity  Assist Walk 10 feet on uneven surfaces activity did not occur: Safety/medical concerns   Assist level: Supervision/Verbal cueing Assistive device: Walker-rolling   Wheelchair     Assist   Type of Wheelchair: Manual    Wheelchair assist level: Moderate Assistance - Patient 50 - 74% Max wheelchair distance: 50    Wheelchair 50 feet with 2 turns  activity    Assist        Assist Level: Moderate Assistance - Patient 50 - 74%   Wheelchair 150 feet activity     Assist      Assist Level: Maximal Assistance - Patient 25 - 49%   Blood pressure (!) 106/44, pulse 78, temperature 98.3 F (36.8 C), resp. rate 18, height 5\' 3"  (1.6 m), weight 81.6 kg, SpO2 97 %.    Medical Problem List and Plan: 1.  Right side weakness secondary to left thalamic infarction secondary to small vessel disease             -patient may shower             -ELOS/Goals: 5/18 supervision/Mod I          2.  Antithrombotics: -DVT/anticoagulation: Lovenox             -antiplatelet therapy: Aspirin 81 mg daily and Plavix 75 mg daily x3 weeks then aspirin alone 3. Pain Management: Lidoderm patch, Voltaren gel as needed  No HA, monitor 4. Mood: Provide emotional support             -antipsychotic agents: N/A 5. Neuropsych: This patient is capable of making decisions on her own behalf. 6. Skin/Wound Care: Routine skin checks 7. Fluids/Electrolytes/Nutrition: Routine in and outs.  CMP ordered. 8.  Bilateral carotid stenosis.  Follow-up outpatient vascular as outpatient. 9.  Myasthenia gravis.  Continue Cellcept 1500  prednisone 10 mg daily.  She is followed at [facility] and also receives IVIG at home.  If patient fails progressive bilateral lower extremity weakness may need to perform IVIG infusion in the hospital 10.  Diabetes mellitus with hyperglycemia.  Hemoglobin A1c 9.1.  NovoLog 15 units 3 times daily, Levemir 35 units daily.  Check blood sugars before meals and at bedtime.            CBG (last 3)  Recent Labs    01/03/20 1649 01/03/20 2052 01/04/20 0600  GLUCAP 260* 270* 85  PM reading elevated will restart Invokana (home med) - reduce the levimir 11.  OSA/asthma.  Singulair 10 mg daily, continue inhalers as directed as well as CPAP.             Monitor with increased exertio n  CPAP resumed 5/8 with improved  sleep 12.Hypertension.  Avapro 150 mg daily, Aldactone 25 mg daily.    Vitals:   01/03/20 2011 01/04/20 0602  BP: (!) 118/43 (!) 106/44  Pulse: 94 78  Resp: 17 18  Temp: 97.8 F (36.6 C) 98.3 F (36.8 C)  SpO2: 96% 97%  5/13 13.  Hypothyroidism.  Synthroid 14.  Hyperlipidemia.  Zetia/Crestor 15.  GERD.  Protonix  LOS: 8 days A FACE TO FACE EVALUATION WAS PERFORMED  Charlett Blake 01/04/2020, 8:07 AM

## 2020-01-04 NOTE — Evaluation (Signed)
Recreational Therapy Assessment and Plan  Patient Details  Name: [name] MRN: [id number] Date of Birth: [birth year] Today's Date: 01/04/2020  Rehab Potential: Good ELOS: 5/18  Assessment    Problem List:      Patient Active Problem List   Diagnosis Date Noted  . 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[place]) 12/27/2019  . Essential hypertension   . Uncomplicated asthma   . Uncontrolled type 2 diabetes mellitus with hyperglycemia ([place])   . Thalamic stroke ([place]) 12/20/2019  . Asymptomatic carotid artery stenosis, bilateral 10/08/2018  . Hypercholesterolemia 10/08/2018  . Headache around the eyes 11/12/2016  . Cervicalgia of occipito-atlanto-axial region 11/12/2016  . Moderate persistent asthma 07/29/2015  . Current use of beta blocker 07/29/2015  . Gastroesophageal reflux disease without esophagitis 07/29/2015  . OSA on CPAP 09/19/2014  . Diabetic neuropathy with neurologic complication ([place]) 38/[id number]/7342  . Myasthenia gravis in remission ([place]) 09/19/2014  . Dyspnea 08/13/2014  . Erb-Goldflam disease ([place]) 07/27/2014  . Cancer of thyroid ([place]) 07/27/2014  . OSA (obstructive sleep apnea) 07/24/2014  . DM (diabetes mellitus) type II uncontrolled with eye manifestation ([place]) 06/01/2014  . 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 Nocturia more than twice per night 06/01/2014  . Snoring 06/01/2014  . Insomnia due to medical condition 06/01/2014  . Neuroma 01/16/2014  . Pseudoclaudication 11/14/2013  . Heart palpitations 06/30/2013  . Sinus tachycardia 03/29/2013  . Myasthenia gravis ([place]) 03/13/2013  . Essential hypertension, benign 03/13/2013  . Asthma, chronic 03/13/2013    Past Medical History:      Past Medical History:  Diagnosis Date  . Arthritis    "all over my body" (03/13/2013)  . Asthma   . Asymptomatic carotid artery stenosis, bilateral 10/08/2018  . GERD (gastroesophageal reflux disease)   . H/O hiatal hernia   . Heart murmur   . Hypercholesterolemia  10/08/2018  . Hypertension   . Hypothyroidism   . Myasthenia gravis ([place])    "in my eyes; diagnsosed > 7 yr ago" (03/13/2013)  . Sleep apnea    on CPAP  . Thyroid carcinoma ([place])   . Type II diabetes mellitus ([place])    Past Surgical History:       Past Surgical History:  Procedure Laterality Date  . ABDOMINAL HYSTERECTOMY    . ANTERIOR CERVICAL DECOMP/DISCECTOMY FUSION     "I've had severa ORs; always went in from the front" (03/13/2013)  . APPENDECTOMY    . CARDIAC CATHETERIZATION     "several" (03/13/2013)  . CARPAL TUNNEL RELEASE Right   . CATARACT EXTRACTION W/ INTRAOCULAR LENS  IMPLANT, BILATERAL Bilateral   . CHOLECYSTECTOMY    . KNEE ARTHROSCOPY Left   . SHOULDER ARTHROSCOPY W/ ROTATOR CUFF REPAIR Left   . TONSILLECTOMY    . TOTAL THYROIDECTOMY      Assessment & Plan Clinical Impression: Patient is a 69 year old right-handed female history of diabetes mellitus, ACDF 2014, chronic pain syndrome, myasthenia gravis maintained on CellCept as well as chronic prednisone and receives IVIG at [facility], asthma /OSA on CPAP, thyroid cancer, hypertension, hyperlipidemia. History taken from chart review and patient. Patient lives alone independent prior to admission and driving. 1 level home. She does have family in the area that worked during the day. She presented on 12/20/19 with right hemiparesis. 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 Nuclear medicine perfusion scan no pulmonary embolus. Patient with recent carotid Doppler noting right ICA greater than 70% and left ICA 50-69% stenosis. Echocardiogram with ejection fraction of  99%, grade 1 diastolic dysfunction. Admission chemistries potassium 3.3, troponin negative, BNP 215.   Patient transferred to CIR on 12/27/2019 .   Pt presents with decreased activity tolerance, decreased functional mobility, decreased balance, decreased coordination Limiting pt's independence with  leisure/community pursuits.  Met with pt today to discuss the importance of staying active, activity analysis identifying potential modifications, and coping skills.  Pt has been modifying activities for quite some time and is adjusted to these modifications.  Pt also shared coping strategies she used PTA and their effectiveness in helping to manage her stress if and when she experiences it.  No further TR education needed at this time.   Plan  No further TR as pt is expected to discharge home 5/18.  Recommendations for other services: None   Discharge Criteria: Patient will be discharged from TR if patient refuses treatment 3 consecutive times without medical reason.  If treatment goals not met, if there is a change in medical status, if patient makes no progress towards goals or if patient is discharged from hospital.  The above assessment, treatment plan, treatment alternatives and goals were discussed and mutually agreed upon: by patient  [place] 01/04/2020, 3:57 PM

## 2020-01-04 NOTE — Progress Notes (Signed)
Occupational Therapy Weekly Progress Note  Patient Details  Name: [name] MRN: [id number] Date of Birth: 07-08-1951  Beginning of progress report period: Dec 28, 2019 End of progress report period: Jan 04, 2020  Today's Date: 01/04/2020 OT Individual Time: 3790-2409 OT Individual Time Calculation (min): 71 min    Patient has met 4 of 4 short term goals. Pt making great progress towards occupational therapy goals this session. Pt currently performing self care tasks at supervision level overall for UB and LB self care. Pt performing functional transfer with use of RW and supervision for safety. Pt has made significant progress in R UE coordination and strength with focus on functional tasks. Pt continues to benefit from OT intervention in order to reach modified independent level goals to return home.   Patient continues to demonstrate the following deficits: muscle weakness, decreased cardiorespiratoy endurance, decreased coordination and decreased motor planning and decreased sitting balance, decreased standing balance, hemiplegia and decreased balance strategies and therefore will continue to benefit from skilled OT intervention to enhance overall performance with BADL and iADL.  Patient progressing toward long term goals..  Continue plan of care.  OT Short Term Goals Week 2:  OT Short Term Goal 1 (Week 2): STG=LTGs secondary to upcoming discharge  Skilled Therapeutic Interventions/Progress Updates:    Upon entering the room, pt seated in recliner chair and requesting to shower. Pt ambulating with RW to dresser to obtain clean clothing items needed and continuing into bathroom with supervision overall. Pt bathing while seated on shower chair with supervision overall and increased time. Pt utilized R UE to wash parts as needed to complete task. Pt drying self and returning to sit on EOB for dressing tasks. Pt did complain about R arm pain and felt a "knot" in R armpit with RN and PA  notified. Pt remained focused and able to finish dressing tasks with supervision overall. Pt required use of figure four position and increased time with min cuing to don non slip slipper socks from EOB. Pt then ambulated with RW and supervision to sit in recliner chair. Chair alarm activated. Call bell and all needed items within reach upon exiting the room.   Therapy Documentation Precautions:  Precautions Precautions: Fall Restrictions Weight Bearing Restrictions: No     Therapy/Group: Individual Therapy  Gypsy Decant 01/04/2020, 12:27 PM

## 2020-01-04 NOTE — Progress Notes (Signed)
Pt noted her R ankle to have swelling. +1 pitting edema to R ankle, +2 pedal pulse. Pt denies pain w/ R ankle. Provider will be made aware during morning rounds.

## 2020-01-04 NOTE — Progress Notes (Signed)
Physical Therapy Weekly Progress Note  Patient Details  Name: [name] MRN: [id number] Date of Birth: 27-Nov-1950  Beginning of progress report period: Dec 28, 2019 End of progress report period: Jan 04, 2020  Today's Date: 01/04/2020 PT Individual Time: 0800-0900 and 1400-1510 PT Individual Time Calculation (min): 60 min and 70 min   Patient has met 3 of 3 short term goals.  Pt making steady progress towards LTG over the past week. With RW, pt has progressed to supervision assist with tranfers and gait and CGA for gait without AD. Pt continues to demonstrate decreased coordination and timing on the R side, limited further progress at this time.    Patient continues to demonstrate the following deficits muscle weakness and muscle joint tightness, decreased cardiorespiratoy endurance, impaired timing and sequencing, unbalanced muscle activation and decreased coordination and decreased standing balance, decreased postural control, hemiplegia and decreased balance strategies and therefore will continue to benefit from skilled PT intervention to increase functional independence with mobility.  Patient progressing toward long term goals..  Continue plan of care.  PT Short Term Goals Week 1:  PT Short Term Goal 1 (Week 1): Pt will ambulate with min assist 147f with LRAD PT Short Term Goal 1 - Progress (Week 1): Met PT Short Term Goal 2 (Week 1): [facility] PT Short Term Goal 2 - Progress (Week 1): Met PT Short Term Goal 3 (Week 1): Pt will ascend 1 step with RW and min assist PT Short Term Goal 3 - Progress (Week 1): Met Week 2:  PT Short Term Goal 1 (Week 2): STG=LTG due to ELOS  Skilled Therapeutic Interventions/Progress Updates:    Session 1   Pt received sitting EOB and agreeable to PT. Gait training with RW 2x 1580fwith supervision assist. Additional gait training without AD 2 x 4040fith min assist progressing to GCA. Cues for relaxation in  upper traps throughout as well as improved weight shifting R and and L to normalize gait pattern and improved step height  PT instructed pt in dynamic balance training on Wii fit balance board. Table tilt x 3 bouts and penguin slide x 2 with min assist overall to improved COM control and use of ankle strategy to perform lateral and anterior/posterior weight shift. Improved COM control with increased time and repetitions.   Patient returned to room and performed stand  Ambulatory transfer with RW and supervision assist. Pt left sitting in recliner with call bell in reach and all needs met.    Session 2.  Pt received sitting in recliner and agreeable to PT. [facility] and supervision assist for safety and cues for improved posture. Throughout treatment, pt performed sit<>stand from various surfaces with supervision assist for safety with and without AD.   PT instructed pt in stair management without rails to allow access to home. Performed with RW x 4 with min assist overall and moderate cues for safety and AD management. Pt then utilized QC with mod assist on first attempt, progressing to min A with step to pattern leading with RLE in ascent and LLR in descent.   PT instructed pt in standing balance while engaged in fine motor task of connect 4 with 1 UE support on RW. No LOB noted throughout and supervision assist from for safety and use of ankle strategy to prevent posterior lean.   Nustep reciprocal movement/cardiorespiratory training with BUE/BLE, level 3-4, x 8 minutes. Pt rated Borg RPE  14/20 at end of endurance training. Patient returned to room and performed stand pivot to recliner with RW and supervision assist. . Pt left sitting in recliner with call bell in reach and all needs met.            Therapy Documentation Precautions:  Precautions Precautions: Fall Restrictions Weight Bearing Restrictions: No Pain:   denies throughout 1st and 2nd treatment.    Therapy/Group: Individual Therapy  [name] 01/04/2020, 2:08 PM

## 2020-01-05 ENCOUNTER — Inpatient Hospital Stay (HOSPITAL_COMMUNITY): Payer: Medicare Other | Admitting: Physical Therapy

## 2020-01-05 ENCOUNTER — Inpatient Hospital Stay (HOSPITAL_COMMUNITY): Payer: Medicare Other | Admitting: Occupational Therapy

## 2020-01-05 LAB — GLUCOSE, CAPILLARY
Glucose-Capillary: 115 mg/dL — ABNORMAL HIGH (ref 70–99)
Glucose-Capillary: 216 mg/dL — ABNORMAL HIGH (ref 70–99)
Glucose-Capillary: 235 mg/dL — ABNORMAL HIGH (ref 70–99)
Glucose-Capillary: 161 mg/dL — ABNORMAL HIGH (ref 70–99)

## 2020-01-05 NOTE — Progress Notes (Addendum)
Patient ID: [name], female   DOB: [birth year], 69 y.o.   MRN: [id number]  sw placed order for DME with [employer]: tub transfer bench, rolling walker

## 2020-01-05 NOTE — Progress Notes (Signed)
Occupational Therapy Session Note  Patient Details  Name: [name] MRN: [id number] Date of Birth: 01-12-1951  Today's Date: 01/05/2020 OT Individual Time: 1000-1100 and 1331-1430 OT Individual Time Calculation (min): 60 min and 59 mins   Short Term Goals: Week 2:  OT Short Term Goal 1 (Week 2): STG=LTGs secondary to upcoming discharge  Skilled Therapeutic Interventions/Progress Updates:    Session 1: Upon entering the room, pt seated in recliner chair with no c/o pain this session. Pt is agreeable to OT intervention. She donned mask with increased time and use of B UEs. Pt transferred into wheelchair with supervision for stand pivot transfer with use of RW. Pt transported to gym for time management. Pt engaged in standing coordination tasks at [employer]. Pt standing with S- Min guard for 3 minutes without use of AD and using R UE only to hit targets. Pt average being over 5 seconds on the L of board and 3.6 seconds on the R side of board. Pt was able to hit all targets. Pt then standing for 1 minute and hitting targets with unaffected L side with and average of 1.7 seconds. Pt taking rest break and then standing for 1 minute on foam air ex with initial min A for balance progressing to close supervision. Average reaction time of 2.6 seconds this time. Pt then engaged in zoom ball task and passing ball on cord x 10 reps 5 times. Pt reporting fatigue and assisted back to bathroom. Pt transferred onto commode for toileting needed with supervision overall and use of RW. Pt ambulating with RW back to recliner chair at end of session. Call bell and all needed items within reach.   Session 2: Upon entering the room, pt seated in recliner chair awaiting OT arrival. Pt with no c/o pain this session. Pt ambulating with RW to dresser to obtain needed clothing items. Pt continued ambulating into bathroom and doffing clothing items while seated on shower chair. OT covered port for shower. Pt bathing  while seated with supervision overall. Pt returning to sit on EOB for dressing tasks with UB self care performed independently and supervision with min cuing for circle sitting to don R sock this session. Pt standing independently and ambulating with supervision to recliner chair. Call bell and all needs within reach.   Therapy Documentation Precautions:  Precautions Precautions: Fall Restrictions Weight Bearing Restrictions: No   Therapy/Group: Individual Therapy  Gypsy Decant 01/05/2020, 12:26 PM

## 2020-01-05 NOTE — Progress Notes (Signed)
Patient placed herself on own CPAP unit for the night.

## 2020-01-05 NOTE — Progress Notes (Signed)
Physical Therapy Session Note  Patient Details  Name: [name] MRN: [id number] Date of Birth: Dec 29, 1950  Today's Date: 01/05/2020 PT Individual Time: 0805-0915 PT Individual Time Calculation (min): 70 min   Short Term Goals: Week 2:  PT Short Term Goal 1 (Week 2): STG=LTG due to ELOS  Skilled Therapeutic Interventions/Progress Updates:   Pt received sitting in EOB and agreeable to PT. Pt performed gait training to day room in RW and distant supervision assist with cues for improved speed and relaxation of upper traps. Additional gait training with RW x 196f around RN station with supervision assist and only min cues for symmetry of gait pattern. Pt also perfromed gait training in rehab gym without AD 2 x 590fwith GCA-supervision assist overall with tactile cues for improved trunkal activation and pelvic control.   Dynamic balance training to stand on blue wedge while engaged in fine motor task of wii bowling. Pt maintained 1 UE support throughout with CGA and cues for use of ankle strategy to correct posterior LOB. Able to tolerate standing for 9 frames prior to rest break.   trunkal NMR to performed lateral reaches with BUE supported at 90 deg adduction to improved trunkal alignment. Additional lateral reaches R and L as well as cross body reaches with 2 x 10 bil each. Improved R side trunkal activation noted with increased repetitions.   Stair management training with QC x 4 with min assist for safety. Pt self selected to ascend with RLE and descend with LLE. Pt encouraged to have faimly present for weekend therapy for education on car transfer and stair management into house, with 2STE with no rail.   Patient returned to room and performed stand pivot to recliner with RW and supervision assist. Pt left sitting in recliner with call bell in reach and all needs met.         Therapy Documentation Precautions:  Precautions Precautions: Fall Restrictions Weight Bearing  Restrictions: No Vital Signs: Therapy Vitals Temp: 98.2 F (36.8 C) Pulse Rate: 84 Resp: 17 BP: (!) 133/58 Patient Position (if appropriate): Lying Oxygen Therapy SpO2: 100 % Pain: Pain Assessment Pain Scale: 0-10 Pain Score: 5  Pain Type: Acute pain Pain Location: Back Pain Descriptors / Indicators: Aching Pain Frequency: Constant Pain Onset: On-going Patients Stated Pain Goal: 3 Pain Intervention(s): Repositioned    Therapy/Group: Individual Therapy  AuLorie Phenix/14/2021, 9:21 AM

## 2020-01-05 NOTE — Progress Notes (Signed)
[place] PHYSICAL MEDICINE & REHABILITATION PROGRESS NOTE   Subjective/Complaints:   Pt asking about EKG, reviewed results Dicussed order for HS Snack   ROS: Patient denies CP, SOB, N/V/D Objective:   No results found. No results for input(s): WBC, HGB, HCT, PLT in the last 72 hours. No results for input(s): NA, K, CL, CO2, GLUCOSE, BUN, CREATININE, CALCIUM in the last 72 hours.  Intake/Output Summary (Last 24 hours) at 01/05/2020 0729 Last data filed at 01/04/2020 0746 Gross per 24 hour  Intake 240 ml  Output --  Net 240 ml     Physical Exam: Vital Signs Blood pressure (!) 133/58, pulse 84, temperature 98.2 F (36.8 C), resp. rate 17, height 5\' 3"  (1.6 m), weight 81.6 kg, SpO2 100 %.  General: No acute distress Mood and affect are appropriate Heart: Regular rate and rhythm no rubs murmurs or extra sounds Lungs: Clear to auscultation, breathing unlabored, no rales or wheezes Abdomen: Positive bowel sounds, soft nontender to palpation, nondistended Extremities: No clubbing, cyanosis, or edema Skin: No evidence of breakdown, no evidence of rash  3-/5 right deltoid bicep tricep grip 3-/5 right hip flexor knee extensor to minus right ankle dorsiflexor Decreased LT right face/arm > R foot  Musculoskeletal: Full passive range of motion in all 4 extremities. No joint swelling   Assessment/Plan: 1. Functional deficits secondary to left thalamic infarct which require 3+ hours per day of interdisciplinary therapy in a comprehensive inpatient rehab setting.  Physiatrist is providing close team supervision and 24 hour management of active medical problems listed below.  Physiatrist and rehab team continue to assess barriers to discharge/monitor patient progress toward functional and medical goals  Care Tool:  Bathing    Body parts bathed by patient: Right arm, Chest, Abdomen, Right upper leg, Left upper leg, Face, Front perineal area, Buttocks, Left arm, Right lower leg,  Left lower leg   Body parts bathed by helper: Right lower leg, Left lower leg     Bathing assist Assist Level: Supervision/Verbal cueing     Upper Body Dressing/Undressing Upper body dressing   What is the patient wearing?: Pull over shirt, Bra    Upper body assist Assist Level: Supervision/Verbal cueing    Lower Body Dressing/Undressing Lower body dressing      What is the patient wearing?: Underwear/pull up, Pants     Lower body assist Assist for lower body dressing: Supervision/Verbal cueing     Toileting Toileting    Toileting assist Assist for toileting: Supervision/Verbal cueing     Transfers Chair/bed transfer  Transfers assist     Chair/bed transfer assist level: Supervision/Verbal cueing     Locomotion Ambulation   Ambulation assist      Assist level: Supervision/Verbal cueing Assistive device: Walker-rolling Max distance: 150   Walk 10 feet activity   Assist     Assist level: Supervision/Verbal cueing Assistive device: Walker-rolling   Walk 50 feet activity   Assist Walk 50 feet with 2 turns activity did not occur: Safety/medical concerns  Assist level: Supervision/Verbal cueing Assistive device: Walker-rolling    Walk 150 feet activity   Assist Walk 150 feet activity did not occur: Safety/medical concerns  Assist level: Supervision/Verbal cueing Assistive device: Walker-rolling    Walk 10 feet on uneven surface  activity   Assist Walk 10 feet on uneven surfaces activity did not occur: Safety/medical concerns   Assist level: Supervision/Verbal cueing Assistive device: Aeronautical engineer   Type of Wheelchair: Manual  Wheelchair assist level: Moderate Assistance - Patient 50 - 74% Max wheelchair distance: 50    Wheelchair 50 feet with 2 turns activity    Assist        Assist Level: Moderate Assistance - Patient 50 - 74%   Wheelchair 150 feet activity     Assist       Assist Level: Maximal Assistance - Patient 25 - 49%   Blood pressure (!) 133/58, pulse 84, temperature 98.2 F (36.8 C), resp. rate 17, height 5\' 3"  (1.6 m), weight 81.6 kg, SpO2 100 %.    Medical Problem List and Plan: 1.  Right side weakness secondary to left thalamic infarction secondary to small vessel disease             -patient may shower             -ELOS/Goals: 5/18 supervision/Mod I         CIR PT, OT< SLP  2.  Antithrombotics: -DVT/anticoagulation: Lovenox             -antiplatelet therapy: Aspirin 81 mg daily and Plavix 75 mg daily x3 weeks then aspirin alone 3. Pain Management: Lidoderm patch, Voltaren gel as needed  No HA, monitor 4. Mood: Provide emotional support             -antipsychotic agents: N/A 5. Neuropsych: This patient is capable of making decisions on her own behalf. 6. Skin/Wound Care: Routine skin checks 7. Fluids/Electrolytes/Nutrition: Routine in and outs.  CMP ordered. 8.  Bilateral carotid stenosis.  Follow-up outpatient vascular as outpatient. 9.  Myasthenia gravis.  Continue Cellcept 1500  prednisone 10 mg daily.  She is followed at [facility] and also receives IVIG at home.  If patient fails progressive bilateral lower extremity weakness may need to perform IVIG infusion in the hospital 10.  Diabetes mellitus with hyperglycemia.  Hemoglobin A1c 9.1.  NovoLog 15 units 3 times daily, Levemir 35 units daily.  Check blood sugars before meals and at bedtime.            CBG (last 3)  Recent Labs    01/04/20 1718 01/04/20 2055 01/05/20 0558  GLUCAP 197* 92 115*  PM reading elevated will restart Invokana 100mg   (home dose 300mg ) -cont 40U levimir 11.  OSA/asthma.  Singulair 10 mg daily, continue inhalers as directed as well as CPAP.             Monitor with increased exertio n  CPAP resumed 5/8 with improved sleep 12.Hypertension.  Avapro 150 mg daily, Aldactone 25 mg daily.    Vitals:   01/04/20 2023 01/05/20 0559  BP: 125/75 (!)  133/58  Pulse: 90 84  Resp: 17 17  Temp: 98.2 F (36.8 C) 98.2 F (36.8 C)  SpO2: 99% 100%  5/14 13.  Hypothyroidism.  Synthroid 14.  Hyperlipidemia.  Zetia/Crestor 15.  GERD.  Protonix  16.  Mild tachy, likely deconditioning normal rythmnLOS: 9 days A FACE TO FACE EVALUATION WAS PERFORMED  Charlett Blake 01/05/2020, 7:29 AM

## 2020-01-06 ENCOUNTER — Ambulatory Visit (HOSPITAL_COMMUNITY): Payer: Medicare Other | Admitting: Physical Therapy

## 2020-01-06 LAB — GLUCOSE, CAPILLARY
Glucose-Capillary: 118 mg/dL — ABNORMAL HIGH (ref 70–99)
Glucose-Capillary: 131 mg/dL — ABNORMAL HIGH (ref 70–99)
Glucose-Capillary: 175 mg/dL — ABNORMAL HIGH (ref 70–99)
Glucose-Capillary: 68 mg/dL — ABNORMAL LOW (ref 70–99)
Glucose-Capillary: 121 mg/dL — ABNORMAL HIGH (ref 70–99)
Glucose-Capillary: 62 mg/dL — ABNORMAL LOW (ref 70–99)

## 2020-01-06 NOTE — Significant Event (Signed)
Hypoglycemic Event  CBG: 68  Treatment: 4oz juice, evening snack  Symptoms: weakness  Follow-up CBG: Time: 22:08 CBG Result: 121  Possible Reasons for Event: no nighttime snack   Comments/MD notified:algorithm followed; 4oz juice given and rechecked at 21:31 (CBG 62). Algorithm repeated, patient given evening snack and CBG rechecked at 22:08 (CBG 121).     Neysa Hotter

## 2020-01-06 NOTE — Progress Notes (Signed)
[place] PHYSICAL MEDICINE & REHABILITATION PROGRESS NOTE   Subjective/Complaints:  Placed new order for HS snack Feeling increased weakness in legs and would like IVIG next week; she will call the home RN to see if this can be set up next week. Moving bowels regularly.    ROS: Patient denies CP, SOB, N/V/D Objective:   No results found. No results for input(s): WBC, HGB, HCT, PLT in the last 72 hours. No results for input(s): NA, K, CL, CO2, GLUCOSE, BUN, CREATININE, CALCIUM in the last 72 hours.  Intake/Output Summary (Last 24 hours) at 01/06/2020 1112 Last data filed at 01/06/2020 0730 Gross per 24 hour  Intake 822 ml  Output --  Net 822 ml     Physical Exam: Vital Signs Blood pressure 127/62, pulse 83, temperature 98 F (36.7 C), resp. rate 17, height 5\' 3"  (1.6 m), weight 81.6 kg, SpO2 99 %.  General: No acute distress Mood and affect are appropriate Heart: Regular rate and rhythm no rubs murmurs or extra sounds Lungs: Clear to auscultation, breathing unlabored, no rales or wheezes Abdomen: Positive bowel sounds, soft nontender to palpation, nondistended Extremities: No clubbing, cyanosis, or edema Skin: No evidence of breakdown, no evidence of rash 3-/5 right deltoid bicep tricep grip 3-/5 right hip flexor knee extensor to minus right ankle dorsiflexor. A little bit weaker than yesterday. Decreased LT right face/arm > R foot  Musculoskeletal: Full passive range of motion in all 4 extremities. No joint swelling   Assessment/Plan: 1. Functional deficits secondary to left thalamic infarct which require 3+ hours per day of interdisciplinary therapy in a comprehensive inpatient rehab setting.  Physiatrist is providing close team supervision and 24 hour management of active medical problems listed below.  Physiatrist and rehab team continue to assess barriers to discharge/monitor patient progress toward functional and medical goals  Care Tool:  Bathing    Body  parts bathed by patient: Right arm, Chest, Abdomen, Right upper leg, Left upper leg, Face, Front perineal area, Buttocks, Left arm, Right lower leg, Left lower leg   Body parts bathed by helper: Right lower leg, Left lower leg     Bathing assist Assist Level: Supervision/Verbal cueing     Upper Body Dressing/Undressing Upper body dressing   What is the patient wearing?: Pull over shirt    Upper body assist Assist Level: Independent with assistive device    Lower Body Dressing/Undressing Lower body dressing      What is the patient wearing?: Underwear/pull up, Pants     Lower body assist Assist for lower body dressing: Supervision/Verbal cueing     Toileting Toileting    Toileting assist Assist for toileting: Independent with assistive device     Transfers Chair/bed transfer  Transfers assist     Chair/bed transfer assist level: Independent with assistive device     Locomotion Ambulation   Ambulation assist      Assist level: Supervision/Verbal cueing Assistive device: Walker-rolling Max distance: 150   Walk 10 feet activity   Assist     Assist level: Supervision/Verbal cueing Assistive device: Walker-rolling   Walk 50 feet activity   Assist Walk 50 feet with 2 turns activity did not occur: Safety/medical concerns  Assist level: Supervision/Verbal cueing Assistive device: Walker-rolling    Walk 150 feet activity   Assist Walk 150 feet activity did not occur: Safety/medical concerns  Assist level: Supervision/Verbal cueing Assistive device: Walker-rolling    Walk 10 feet on uneven surface  activity   Assist Walk 10  feet on uneven surfaces activity did not occur: Safety/medical concerns   Assist level: Supervision/Verbal cueing Assistive device: Aeronautical engineer   Type of Wheelchair: Manual    Wheelchair assist level: Moderate Assistance - Patient 50 - 74% Max wheelchair distance: 50    Wheelchair  50 feet with 2 turns activity    Assist        Assist Level: Moderate Assistance - Patient 50 - 74%   Wheelchair 150 feet activity     Assist      Assist Level: Maximal Assistance - Patient 25 - 49%   Blood pressure 127/62, pulse 83, temperature 98 F (36.7 C), resp. rate 17, height 5\' 3"  (1.6 m), weight 81.6 kg, SpO2 99 %.    Medical Problem List and Plan: 1.  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  Antithrombotics: -DVT/anticoagulation: D/ced Lovenox as ambulating 180 feet             -antiplatelet therapy: Aspirin 81 mg daily and Plavix 75 mg daily x3 weeks then aspirin alone 3. Pain Management: Lidoderm patch, Voltaren gel as needed  No HA, monitor 4. Mood: Provide emotional support             -antipsychotic agents: N/A 5. Neuropsych: This patient is capable of making decisions on her own behalf. 6. Skin/Wound Care: Routine skin checks 7. Fluids/Electrolytes/Nutrition: Routine in and outs.   8.  Bilateral carotid stenosis.  Follow-up outpatient vascular as outpatient. 9.  Myasthenia gravis.  Continue Cellcept 1500  prednisone 10 mg daily.  She is followed at [facility] and also receives IVIG at home.  If patient fails progressive bilateral lower extremity weakness may need to perform IVIG infusion in the hospital 10.  Diabetes mellitus with hyperglycemia.  Hemoglobin A1c 9.1.  NovoLog 15 units 3 times daily, Levemir 35 units daily.  Check blood sugars before meals and at bedtime.            CBG (last 3)  Recent Labs    01/05/20 1641 01/05/20 2053 01/06/20 0557  GLUCAP 235* 161* 118*  PM reading elevated will restart Invokana 100mg   (home dose 300mg ) -cont 40U levimir  118 on 5/15 11.  OSA/asthma.  Singulair 10 mg daily, continue inhalers as directed as well as CPAP.              Monitor with increased exertio n  CPAP resumed 5/8 with improved sleep 12.Hypertension.  Avapro 150 mg daily, Aldactone 25 mg daily.    Vitals:   01/05/20 2006 01/06/20 0554  BP: (!) 117/52 127/62  Pulse: 92 83  Resp: 17 17  Temp: 98 F (36.7 C) 98 F (36.7 C)  SpO2: 98% 99%  5/15 BP well controlled 13.  Hypothyroidism.  Synthroid 14.  Hyperlipidemia.  Zetia/Crestor 15.  GERD.  Protonix  16.  Mild tachy, likely deconditioning normal rythmnLOS: 10 days A FACE TO FACE EVALUATION WAS PERFORMED  [name] P Cheyne Bungert 01/06/2020, 11:12 AM

## 2020-01-06 NOTE — Progress Notes (Signed)
Physical Therapy Session Note  Patient Details  Name: [name] MRN: [id number] Date of Birth: 12/13/1950  Today's Date: 01/06/2020 PT Individual Time: 1003-1100 PT Individual Time Calculation (min): 57 min   Short Term Goals: Week 2:  PT Short Term Goal 1 (Week 2): STG=LTG due to ELOS  Skilled Therapeutic Interventions/Progress Updates:    Pt received sitting in recliner and agreeable to PT. Ambulatory transfer to WC with RW and supervision assist. Pt donned shoes without assist. Pt's sister present for family education. Pt transported to rehab gym in WC.  Gait training with RW x 150ft with distant supervision assist for safety with only min cues for pt and sister for awareness of intermittent foot drag and how to cue to correct.   Stair management training with QC 4 step x 2 with min assist from PT and then min assist from sister. Cues for step to gait pattern and proper guarding position below pt to limit risk of falling down stairs.   Car transfer training with RW and supervision assist overall. sist required to stabilize RW out of car, as pt had nothing to push from. Performed safety and effectively.   PT instructed pt in dynamic balance training on wii fit balance board, table tilt x 2 bouts with supervision assist overall and moderate cues for improved hip strategy with posterior weight shifting.   Patient returned to room and performed stand pivot to recliner with RW and supervision assist. Pt left sitting in recliner with call bell in reach and all needs met.         Therapy Documentation Precautions:  Precautions Precautions: Fall Restrictions Weight Bearing Restrictions: No   Pain:   denies   Therapy/Group: Individual Therapy  Austin E Tucker 01/06/2020, 12:23 PM  

## 2020-01-07 ENCOUNTER — Inpatient Hospital Stay (HOSPITAL_COMMUNITY): Payer: Medicare Other

## 2020-01-07 LAB — GLUCOSE, CAPILLARY
Glucose-Capillary: 163 mg/dL — ABNORMAL HIGH (ref 70–99)
Glucose-Capillary: 151 mg/dL — ABNORMAL HIGH (ref 70–99)
Glucose-Capillary: 194 mg/dL — ABNORMAL HIGH (ref 70–99)
Glucose-Capillary: 128 mg/dL — ABNORMAL HIGH (ref 70–99)

## 2020-01-07 MED ORDER — INSULIN DETEMIR 100 UNIT/ML ~~LOC~~ SOLN
39.0000 [IU] | Freq: Every day | SUBCUTANEOUS | Status: DC
  Administered 2020-01-08 – 2020-01-09 (×2): 39 [IU] via SUBCUTANEOUS
  Filled 2020-01-07 (×3): qty 0.39

## 2020-01-07 NOTE — Progress Notes (Signed)
Pt placed self on home CPAP.

## 2020-01-07 NOTE — Progress Notes (Signed)
[place] PHYSICAL MEDICINE & REHABILITATION PROGRESS NOTE   Subjective/Complaints:  Placed new order for HS snack Feeling increased weakness in legs and would like IVIG next week; she will call the home RN to see if this can be set up next week. Moving bowels regularly.    ROS: Patient denies CP, SOB, N/V/D Objective:   No results found. No results for input(s): WBC, HGB, HCT, PLT in the last 72 hours. No results for input(s): NA, K, CL, CO2, GLUCOSE, BUN, CREATININE, CALCIUM in the last 72 hours.  Intake/Output Summary (Last 24 hours) at 01/07/2020 1139 Last data filed at 01/07/2020 0804 Gross per 24 hour  Intake 460 ml  Output --  Net 460 ml     Physical Exam: Vital Signs Blood pressure (!) 142/61, pulse 88, temperature 98.1 F (36.7 C), temperature source Oral, resp. rate 16, height 5\' 3"  (1.6 m), weight 81.6 kg, SpO2 98 %.  General: No acute distress Mood and affect are appropriate Heart: Regular rate and rhythm no rubs murmurs or extra sounds Lungs: Clear to auscultation, breathing unlabored, no rales or wheezes Abdomen: Positive bowel sounds, soft nontender to palpation, nondistended Extremities: No clubbing, cyanosis, or edema Skin: No evidence of breakdown, no evidence of rash 3-/5 right deltoid bicep tricep grip 3-/5 right hip flexor knee extensor to minus right ankle dorsiflexor. A little bit weaker than yesterday. Decreased LT right face/arm > R foot  Musculoskeletal: Full passive range of motion in all 4 extremities. No joint swelling   Assessment/Plan: 1. Functional deficits secondary to left thalamic infarct which require 3+ hours per day of interdisciplinary therapy in a comprehensive inpatient rehab setting.  Physiatrist is providing close team supervision and 24 hour management of active medical problems listed below.  Physiatrist and rehab team continue to assess barriers to discharge/monitor patient progress toward functional and medical goals  Care  Tool:  Bathing    Body parts bathed by patient: Right arm, Chest, Abdomen, Right upper leg, Left upper leg, Face, Front perineal area, Buttocks, Left arm, Right lower leg, Left lower leg   Body parts bathed by helper: Right lower leg, Left lower leg     Bathing assist Assist Level: Supervision/Verbal cueing     Upper Body Dressing/Undressing Upper body dressing   What is the patient wearing?: Pull over shirt    Upper body assist Assist Level: Independent with assistive device    Lower Body Dressing/Undressing Lower body dressing      What is the patient wearing?: Underwear/pull up, Pants     Lower body assist Assist for lower body dressing: Supervision/Verbal cueing     Toileting Toileting    Toileting assist Assist for toileting: Independent with assistive device     Transfers Chair/bed transfer  Transfers assist     Chair/bed transfer assist level: Independent with assistive device     Locomotion Ambulation   Ambulation assist      Assist level: Supervision/Verbal cueing Assistive device: Walker-rolling Max distance: 150   Walk 10 feet activity   Assist     Assist level: Supervision/Verbal cueing Assistive device: Walker-rolling   Walk 50 feet activity   Assist Walk 50 feet with 2 turns activity did not occur: Safety/medical concerns  Assist level: Supervision/Verbal cueing Assistive device: Walker-rolling    Walk 150 feet activity   Assist Walk 150 feet activity did not occur: Safety/medical concerns  Assist level: Supervision/Verbal cueing Assistive device: Walker-rolling    Walk 10 feet on uneven surface  activity  Assist Walk 10 feet on uneven surfaces activity did not occur: Safety/medical concerns   Assist level: Supervision/Verbal cueing Assistive device: Walker-rolling   Wheelchair     Assist   Type of Wheelchair: Manual    Wheelchair assist level: Moderate Assistance - Patient 50 - 74% Max wheelchair  distance: 50    Wheelchair 50 feet with 2 turns activity    Assist        Assist Level: Moderate Assistance - Patient 50 - 74%   Wheelchair 150 feet activity     Assist      Assist Level: Maximal Assistance - Patient 25 - 49%   Blood pressure (!) 142/61, pulse 88, temperature 98.1 F (36.7 C), temperature source Oral, resp. rate 16, height 5\' 3"  (1.6 m), weight 81.6 kg, SpO2 98 %.    Medical Problem List and Plan: 1.  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  Antithrombotics: -DVT/anticoagulation: D/ced Lovenox as ambulating 180 feet             -antiplatelet therapy: Aspirin 81 mg daily and Plavix 75 mg daily x3 weeks then aspirin alone 3. Pain Management: Lidoderm patch, Voltaren gel as needed  Some headache, does not want medication 4. Mood: Provide emotional support             -antipsychotic agents: N/A 5. Neuropsych: This patient is capable of making decisions on her own behalf. 6. Skin/Wound Care: Routine skin checks 7. Fluids/Electrolytes/Nutrition: Routine in and outs.   8.  Bilateral carotid stenosis.  Follow-up outpatient vascular as outpatient. 9.  Myasthenia gravis.  Continue Cellcept 1500  prednisone 10 mg daily.  She is followed at [facility] and also receives IVIG at home.  If patient fails progressive bilateral lower extremity weakness may need to perform IVIG infusion in the hospital 10.  Diabetes mellitus with hyperglycemia.  Hemoglobin A1c 9.1.  NovoLog 15 units 3 times daily, Levemir 35 units daily.  Check blood sugars before meals and at bedtime.            CBG (last 3)  Recent Labs    01/06/20 2131 01/06/20 2208 01/07/20 0613  GLUCAP 62* 121* 163*  PM reading elevated will restart Invokana 100mg   (home dose 300mg ) -cont 40U levimir  118 on 5/15  5/16: decrease  Levermir to 39U given hypoglycemic event 11.  OSA/asthma.  Singulair 10 mg daily, continue inhalers as directed as well as CPAP.             Monitor with increased exertio n  CPAP resumed 5/8 with improved sleep 12.Hypertension.  Avapro 150 mg daily, Aldactone 25 mg daily.    Vitals:   01/06/20 1926 01/07/20 0608  BP: 124/60 (!) 142/61  Pulse: 88 88  Resp: 18 16  Temp: 97.9 F (36.6 C) 98.1 F (36.7 C)  SpO2: 97% 98%  5/16 BP well controlled 13.  Hypothyroidism.  Synthroid 14.  Hyperlipidemia.  Zetia/Crestor 15.  GERD.  Protonix  16.  Mild tachy, likely deconditioning normal rythmnLOS: 11 days A FACE TO FACE EVALUATION WAS PERFORMED  [name] 01/07/2020, 11:39 AM

## 2020-01-07 NOTE — Progress Notes (Signed)
Physical Therapy Session Note  Patient Details  Name: [name] MRN: [id number] Date of Birth: May 20, 1951  Today's Date: 01/07/2020 PT Individual Time: 1502-1600 PT Individual Time Calculation (min): 58 min    Short Term Goals: Week 2:  PT Short Term Goal 1 (Week 2): STG=LTG due to ELOS  Skilled Therapeutic Interventions/Progress Updates:    Pt seated in recliner upon PT arrival, agreeable to therapy tx and denies pain but does report some tightness in the R arm. Pt transferred to standing with RW and supervision, ambulated from room>dayroom with RW and supervision x 120 ft, decreased step length and gait speed noted. Pt used nustep this session for global strength and endurance x 5 minutes on workload 5 this session using both arms and legs. Ambulated to the mat x 60 ft with RW and supervision. Pt transferred to supine on the mat this session with supervision. With patient supine on the mat therapist performed R shoulder flexion PROM for stretching. Pt performed shoulder flexion against manual resistance in supine x 10 and then ER against manual resistance x 10 for neuro re-ed and strengthening. Pt transferred to sitting with supervision. From the mat pt performed 2 x 10 sit<>stands without UE support working on LE strengthening, cues for techniques. Pt worked on hip strengthening and dynamic balance this session to perform sidestepping this session without UE support 4 x 10 ft in each direction with cues for techniques, min assist. Pt worked on dynamic balance and gait to performed forwards/backwards ambulation without UE support 3 x 10 ft in each direction, min assist, cues for relax Ue's and cues for step length. Pt ambulated back to room with RW and supervision working on gait, cues for increased gait speed. Pt left in recliner at end of session with needs in reach and chair alarm set.   Therapy Documentation Precautions:  Precautions Precautions: Fall Restrictions Weight Bearing  Restrictions: No    Therapy/Group: Individual Therapy  Netta Corrigan, PT, DPT, CSRS 01/07/2020, 1:06 PM

## 2020-01-07 NOTE — Discharge Summary (Signed)
Physician Discharge Summary  Patient ID: [name] MRN: [id number] DOB/AGE: 67-04-1951 67 y.o.  Admit date: 12/27/2019 Discharge date: 01/09/2020  Discharge Diagnoses:  Principal Problem:   Thalamic stroke [facility]) Active Problems:   Left thalamic infarction [facility]) DVT prophylaxis Myasthenia gravis Diabetes mellitus with peripheral neuropathy OSA/asthma Bilateral carotid stenosis Hypertension Hypothyroidism Hyperlipidemia GERD  Discharged Condition: Stable  Significant Diagnostic Studies: DG Chest 2 View  Result Date: 12/19/2019 CLINICAL DATA:  Left-sided chest pain EXAM: CHEST - 2 VIEW COMPARISON:  06/14/2017 FINDINGS: Right-sided central venous port tip over the proximal right atrium. Low lung volumes. No consolidation, pleural effusion or pneumothorax. Borderline to mild cardiomegaly. Aortic atherosclerosis. No pneumothorax. IMPRESSION: Low lung volumes. Borderline to mild cardiomegaly. Electronically Signed   By: Donavan Foil M.D.   On: 12/19/2019 17:20   MR ANGIO HEAD WO CONTRAST  Result Date: 12/21/2019 CLINICAL DATA:  Left thalamic stroke EXAM: MRA HEAD WITHOUT CONTRAST TECHNIQUE: Angiographic images of the Circle of Willis were obtained using MRA technique without intravenous contrast. COMPARISON:  MRI head 12/20/2019 FINDINGS: Both vertebral arteries widely patent. Right PICA patent. Left PICA is small and appears to arise from the distal left vertebral artery. Right AICA patent. Basilar widely patent. Superior cerebellar and posterior cerebral arteries patent bilaterally. Internal carotid artery widely patent bilaterally. Anterior and middle cerebral arteries patent. Mild stenosis of the origin of a branch of the superior division of the right middle cerebral artery. This vessel also shows a moderately severe distal stenosis. Negative for cerebral aneurysm. IMPRESSION: Atherosclerotic disease right middle cerebral artery branch otherwise negative. No large vessel occlusion.  Electronically Signed   By: Franchot Gallo M.D.   On: 12/21/2019 17:28   MR BRAIN WO CONTRAST  Result Date: 12/20/2019 CLINICAL DATA:  Neuro deficit, acute, stroke suspected. Additional history provided: Right facial numbness and right arm weakness, known history of carotid disease. EXAM: MRI HEAD WITHOUT CONTRAST TECHNIQUE: Multiplanar, multiecho pulse sequences of the brain and surrounding structures were obtained without intravenous contrast. COMPARISON:  Report from head CT 05/24/2000 (images unavailable). FINDINGS: Brain: There is a 7 mm focus of restricted diffusion within the left thalamus consistent with acute lacunar infarct (series 5, image 30). Corresponding T2/FLAIR hyperintensity at this site. No acute infarct is identified elsewhere within the brain. No significant white matter disease for age. Cerebral volume is normal. No evidence of intracranial mass. No chronic intracranial blood products. No extra-axial fluid collection. No midline shift. Vascular: Expected proximal arterial flow voids. Skull and upper cervical spine: No focal marrow lesion. Sequela of prior cervical spine fusion with susceptibility artifact arising from spinal fusion hardware. Adjacent level disease at C2-C3 with a posterior disc osteophyte complex and ligamentum flavum hypertrophy contributing to suspected at least mild/moderate spinal canal stenosis. Sinuses/Orbits: Visualized orbits show no acute finding. Right maxillary sinus mucous retention cyst. Mild ethmoid and left sphenoid sinus mucosal thickening. Trace fluid within the bilateral mastoid air cells. 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 Otherwise unremarkable MRI appearance of the brain for age. Paranasal sinus disease as described. Trace bilateral mastoid effusions. Sequela of prior cervical fusion. C2-C3 adjacent level disease with suspected at least mild/moderate spinal canal stenosis. Electronically Signed   By: Kellie Simmering DO   On:  12/20/2019 12:56   NM Pulmonary Perfusion  Result Date: 12/20/2019 CLINICAL DATA:  Left side chest pain, shortness of breath EXAM: NUCLEAR MEDICINE PERFUSION LUNG SCAN TECHNIQUE: Perfusion images were obtained in multiple projections after intravenous injection of radiopharmaceutical. Ventilation  scans intentionally deferred if perfusion scan and chest x-ray adequate for interpretation during COVID 19 epidemic. RADIOPHARMACEUTICALS:  1.55 mCi Tc-37m MAA IV COMPARISON:  Chest x-ray 12/19/2019 FINDINGS: No perfusion defects seen to suggest pulmonary embolus. IMPRESSION: No evidence of pulmonary embolus. Electronically Signed   By: Rolm Baptise M.D.   On: 12/20/2019 08:16   MR ANGIO CHEST W WO CONTRAST  Result Date: 12/20/2019 CLINICAL DATA:  69 year old female with severe contrast allergy and acute chest pain concerning for possible dissection. EXAM: MRA CHEST WITH OR WITHOUT CONTRAST TECHNIQUE: Angiographic images of the chest were obtained using MRA technique with intravenous contrast. CONTRAST:  84mL GADAVIST GADOBUTROL 1 MMOL/ML IV SOLN COMPARISON:  None. FINDINGS: VASCULAR Aorta: Conventional 3 vessel arch anatomy. No evidence of dissection, aneurysm or acute thoracic aortic abnormality. Mildly irregular atherosclerotic plaque present in the descending thoracic aorta. No penetrating ulceration. Signal void in the proximal left subclavian artery over approximately 2 cm may represent an intermediate segment occlusion. Heart: The heart is normal in size. However, there is significant concentric hypertrophy of the ventricular myocardium. Pulmonary Arteries:  Normal in caliber.  No large PE. Other: Right IJ approach single-lumen power injectable port catheter is present. The catheter tip terminates at the superior cavoatrial junction. NON-VASCULAR Mediastinum: No mediastinal mass or adenopathy. Lungs/pleura. No focal signal abnormality or abnormal enhancement. No evidence of pleural effusion. Upper abdomen: 1.8  cm simple cyst in the upper pole of the right kidney. No acute abnormality within the upper abdomen. Musculoskeletal: No focal signal abnormality or abnormal enhancement. IMPRESSION: VASCULAR 1. No evidence of acute aortic dissection, aortic aneurysm or other acute vascular abnormality. 2. 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 3. Heterogeneous and irregular/ulcerated atherosclerotic plaque along the descending thoracic aorta. 4. Concentric hypertrophy of the left ventricular myocardium suggests chronic systolic hypertension. NON-VASCULAR 1. No acute abnormality within the visualized upper abdomen. 2. Simple right upper pole renal cyst. Electronically Signed   By: Jacqulynn Cadet M.D.   On: 12/20/2019 15:01   ECHOCARDIOGRAM COMPLETE  Result Date: 12/21/2019    ECHOCARDIOGRAM REPORT   Patient Name:   [name] Date of Exam: 12/20/2019 Medical Rec #:  [id number]       Height:       63.0 in Accession #:    [id number]      Weight:       180.0 lb Date of Birth:  07-27-1951        BSA:          1.849 m Patient Age:    63 years        BP:           123/70 mmHg Patient Gender: F               HR:           100 bpm. Exam Location:  Inpatient Procedure: 2D Echo Indications:     stroke 434.91  History:         Patient has prior history of Echocardiogram examinations, most                  recent 05/29/2015. Risk Factors:Hypertension, Diabetes,                  Dyslipidemia and Sleep Apnea.  Sonographer:     [place] Referring Phys:  Murray Diagnosing Phys: Adrian Prows MD  Sonographer Comments: Image acquisition  challenging due to respiratory motion. IMPRESSIONS  1. Left ventricular ejection fraction, by estimation, is 60 to 65%. The left ventricle has normal function. The left ventricle has no regional wall motion abnormalities. Left ventricular diastolic parameters are consistent with Grade I diastolic  dysfunction (impaired relaxation).  2. Right ventricular systolic function is normal. The right ventricular size is normal.  3. Left atrial size was moderately dilated.  4. The mitral valve is grossly normal. Trivial mitral valve regurgitation. No evidence of mitral stenosis.  5. Mild calcification of the non coronary cusp.. The aortic valve is tricuspid. Aortic valve regurgitation is not visualized. No aortic stenosis is present.  6. The inferior vena cava is normal in size with greater than 50% respiratory variability, suggesting right atrial pressure of 3 mmHg. FINDINGS  Left Ventricle: Left ventricular ejection fraction, by estimation, is 60 to 65%. The left ventricle has normal function. The left ventricle has no regional wall motion abnormalities. The left ventricular internal cavity size was small. There is no left ventricular hypertrophy. Left ventricular diastolic parameters are consistent with Grade I diastolic dysfunction (impaired relaxation). Normal left ventricular filling pressure. Right Ventricle: The right ventricular size is normal. No increase in right ventricular wall thickness. Right ventricular systolic function is normal. Left Atrium: Left atrial size was moderately dilated. Right Atrium: Right atrial size was normal in size. Pericardium: There is no evidence of pericardial effusion. Mitral Valve: The mitral valve is grossly normal. Mild to moderate mitral annular calcification. Trivial mitral valve regurgitation. No evidence of mitral valve stenosis. Tricuspid Valve: The tricuspid valve is normal in structure. Tricuspid valve regurgitation is not demonstrated. Aortic Valve: Mild calcification of the non coronary cusp. The aortic valve is tricuspid. Aortic valve regurgitation is not visualized. No aortic stenosis is present. There is mild calcification of the aortic valve. Pulmonic Valve: The pulmonic valve was not well visualized. Pulmonic valve regurgitation is trivial. Aorta: The aortic  root is normal in size and structure. Venous: The inferior vena cava is normal in size with greater than 50% respiratory variability, suggesting right atrial pressure of 3 mmHg. IAS/Shunts: No atrial level shunt detected by color flow Doppler.  LEFT VENTRICLE PLAX 2D LVIDd:         3.20 cm  Diastology LVIDs:         2.00 cm  LV e' lateral:   7.94 cm/s LV PW:         1.20 cm  LV E/e' lateral: 8.9 LV IVS:        1.00 cm  LV e' medial:    5.87 cm/s LVOT diam:     1.80 cm  LV E/e' medial:  12.1 LV SV:         63 LV SV Index:   34 LVOT Area:     2.54 cm  RIGHT VENTRICLE RV S prime:     15.70 cm/s LEFT ATRIUM             Index       RIGHT ATRIUM          Index LA diam:        2.90 cm 1.57 cm/m  RA Area:     8.67 cm LA Vol (A2C):   33.3 ml 18.01 ml/m RA Volume:   15.10 ml 8.17 ml/m LA Vol (A4C):   34.1 ml 18.44 ml/m LA Biplane Vol: 33.6 ml 18.17 ml/m  AORTIC VALVE LVOT Vmax:   132.00 cm/s LVOT Vmean:  91.800 cm/s LVOT VTI:  0.248 m  AORTA Ao Root diam: 3.10 cm MITRAL VALVE MV Area (PHT): 4.36 cm     SHUNTS MV Decel Time: 174 msec     Systemic VTI:  0.25 m MV E velocity: 71.00 cm/s   Systemic Diam: 1.80 cm MV A velocity: 113.00 cm/s MV E/A ratio:  0.63 Adrian Prows MD Electronically signed by Adrian Prows MD Signature Date/Time: 12/21/2019/7:35:14 AM    Final    VAS [place] LOWER EXTREMITY VENOUS (DVT) (ONLY [facility] & [facility])  Result Date: 12/20/2019  Lower Venous DVTStudy Indications: Edema.  Comparison Study: no prior Performing Technologist: June Leap RDMS, RVT  Examination Guidelines: A complete evaluation includes B-mode imaging, spectral Doppler, color Doppler, and power Doppler as needed of all accessible portions of each vessel. Bilateral testing is considered an integral part of a complete examination. Limited examinations for reoccurring indications may be performed as noted. The reflux portion of the exam is performed with the patient in reverse Trendelenburg.   +---------+---------------+---------+-----------+----------+--------------+ RIGHT    CompressibilityPhasicitySpontaneityPropertiesThrombus Aging +---------+---------------+---------+-----------+----------+--------------+ CFV      Full           Yes      Yes                                 +---------+---------------+---------+-----------+----------+--------------+ SFJ      Full                                                        +---------+---------------+---------+-----------+----------+--------------+ FV Prox  Full                                                        +---------+---------------+---------+-----------+----------+--------------+ FV Mid   Full                                                        +---------+---------------+---------+-----------+----------+--------------+ FV DistalFull                                                        +---------+---------------+---------+-----------+----------+--------------+ PFV      Full                                                        +---------+---------------+---------+-----------+----------+--------------+ POP      Full           Yes      Yes                                 +---------+---------------+---------+-----------+----------+--------------+ PTV  Full                                                        +---------+---------------+---------+-----------+----------+--------------+ PERO     Full                                                        +---------+---------------+---------+-----------+----------+--------------+   +---------+---------------+---------+-----------+----------+--------------+ LEFT     CompressibilityPhasicitySpontaneityPropertiesThrombus Aging +---------+---------------+---------+-----------+----------+--------------+ CFV      Full           Yes      Yes                                  +---------+---------------+---------+-----------+----------+--------------+ SFJ      Full                                                        +---------+---------------+---------+-----------+----------+--------------+ FV Prox  Full                                                        +---------+---------------+---------+-----------+----------+--------------+ FV Mid   Full                                                        +---------+---------------+---------+-----------+----------+--------------+ FV DistalFull                                                        +---------+---------------+---------+-----------+----------+--------------+ PFV      Full                                                        +---------+---------------+---------+-----------+----------+--------------+ POP      Full           Yes      Yes                                 +---------+---------------+---------+-----------+----------+--------------+ PTV      Full                                                        +---------+---------------+---------+-----------+----------+--------------+  PERO     Full                                                        +---------+---------------+---------+-----------+----------+--------------+     Summary: RIGHT: - There is no evidence of deep vein thrombosis in the lower extremity.  - No cystic structure found in the popliteal fossa.  LEFT: - There is no evidence of deep vein thrombosis in the lower extremity.  - No cystic structure found in the popliteal fossa.  *See table(s) above for measurements and observations. Electronically signed by Servando Snare MD on 12/20/2019 at 8:24:18 PM.    Final    PCV CAROTID DUPLEX (BILATERAL)  Result Date: 12/17/2019 Carotid artery duplex  12/11/2019: Stenosis in the right internal carotid artery (>=70%). Peak velocity 295/100 cm/S. The right PSV internal/common carotid artery ratio of 4.14 is  consistent with a stenosis of >70%. 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 Antegrade right vertebral artery flow. Antegrade left vertebral artery flow. Follow up in six months is appropriate if clinically indicated. Compared to the study done on 04/10/2019, right ICA stenosis has progressed.   Labs:  Basic Metabolic Panel: No results for input(s): NA, K, CL, CO2, GLUCOSE, BUN, CREATININE, CALCIUM, MG, PHOS in the last 168 hours.  CBC: No results for input(s): WBC, NEUTROABS, HGB, HCT, MCV, PLT in the last 168 hours.  CBG: Recent Labs  Lab 01/07/20 2053 01/08/20 0601 01/08/20 1216 01/08/20 1638 01/08/20 2051  GLUCAP 128* 128* 252* 193* 131*   Family history.  Sister with CAD as well as CVA and diabetes mellitus.  Mother with CHF and asthma as well as diabetes mellitus.  Father with CHF lung cancer and asthma.  Denies any colon cancer esophageal cancer or rectal cancer  Brief HPI:   [name] is a 69 y.o. right-handed female with history of diabetes mellitus, ACDF 2014, chronic pain syndrome, myasthenia gravis maintained on CellCept as well as chronic prednisone receives IVIG at [facility], asthma with OSA, thyroid cancer, hypertension, hyperlipidemia.  She lives alone independent prior to admission.  She does have family in the area that work.  Presented 12/20/2019 with right side weakness.  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  Nuclear medicine perfusion scan no embolus.  Patient with recent carotid Dopplers noted right ICA greater than 70% left ICA 50 to 69%.  Echocardiogram with ejection fraction of 123456 grade 1 diastolic dysfunction.  Admission chemistries potassium 3.3 troponin negative BNP 215, maintained on aspirin and Plavix for CVA prophylaxis.  Subcutaneous Lovenox for DVT prophylaxis.  Vascular surgery follow-up in regards to carotid stenosis no plan for surgical intervention follow-up outpatient.  Tolerating a regular diet.  Patient was  admitted for a comprehensive rehab program.   Hospital Course: [name] was admitted to rehab 12/27/2019 for inpatient therapies to consist of PT, ST and OT at least three hours five days a week. Past admission physiatrist, therapy team and rehab RN have worked together to provide customized collaborative inpatient rehab.  Pertaining to patient's left thalamic infarction remained stable she would continue aspirin Plavix x3 weeks and aspirin alone with follow-up per neurology services.  Pain control with the use of a Lidoderm patch.  She was on subcutaneous Lovenox for DVT prophylaxis after patient supervision for ambulation.  Findings of bilateral carotid stenosis follow-up outpatient vascular surgery.  History of myasthenia gravis on CellCept and prednisone she would follow-up [facility] for IVIG as recommended.  Blood sugars controlled hemoglobin A1c 9.1 insulin therapy as directed with full diabetic teaching.  History of asthma inhalers as advised she was on CPAP.  Blood pressure controlled on Avapro as well as Aldactone she would follow-up with her primary MD.  Synthroid ongoing for hypothyroidism.  Zetia and Crestor for hyperlipidemia.   Blood pressures were monitored on TID basis and controlled  Diabetes has been monitored with ac/hs CBG checks and SSI was use prn for tighter BS control.   She is continent of bowel and bladder.  She has made gains during rehab stay and is attending therapies  She will continue to receive follow up therapies   after discharge  Rehab course: During patient's stay in rehab weekly team conferences were held to monitor patient's progress, set goals and discuss barriers to discharge. At admission, patient required min mod assist stand pivot transfers moderate assist ambulate 100 feet minimal guard side-lying to sitting.  Supervision lower body bathing set up upper body bathing minimal assist lower body dressing minimal assist toilet  transfers  Physical exam.  Blood pressure 139/58 pulse 96 temperature 95 respiration 20 oxygen saturation 96% room air Constitutional.  Well-developed well-nourished HEENT Head.  Normocephalic and atraumatic Eyes.  Pupils round and reactive to light no discharge.nystagmus Neck.  Supple nontender no JVD without thyromegaly Cardiac regular rate rhythm without any extra sounds or murmur heard Abdomen.  Soft nontender positive bowel sounds without rebound Neurological.  Alert oriented follows commands Motor.  Right upper extremity shoulder abduction 4/5 elbow flexion extension 4/5 handgrip 3 -/5 Right lower extremity 3/5 proximal to distal Sensation diminished to light touch on the right side  She  has had improvement in activity tolerance, balance, postural control as well as ability to compensate for deficits. She has had improvement in functional use RUE/LUE  and RLE/LLE as well as improvement in awareness.  Ambulatory transfer to wheelchair with rolling walker supervision.  Patient donned shoes without assistance.  Ambulates 150 feet distance supervision for safety stair management training minimal assist.  Car transfer training rolling walker supervision assist overall.  She got his belongings for activities delivered and homemaking.  Full family teaching completed and plan discharged home       Disposition: Discharge to home    Diet: Diabetic diet  Special Instructions: No driving smoking or alcohol  Aspirin Plavix x3 weeks and aspirin alone  Continue CPAP as directed  Medications at discharge 1.  Tylenol as needed 2.  Aspirin 81 mg p.o. daily 3.  Invokana 100 mg p.o. daily 4.  Plavix 75 mg p.o. daily X five days 5.restasis ophthalmic solution 0.05% 1 drop both eyes twice daily 6.  Voltaren gel 2 g 4 times a day to affected area 7.  Zetia 10 mg p.o. daily 8.  NovoLog 16 units 3 times daily with meals 9.  Levemir 40 units daily 10.  Avapro 150 mg p.o. daily 11.  Synthroid  137 mcg p.o. daily 12.  Lidoderm patch change as directed 13.  Claritin 10 mg p.o. daily 14.  Singulair 10 mg p.o. daily 15.  CellCept 1500 mg p.o. twice daily 16.  Protonix 40 mg p.o. daily 17.  Prednisone 10 mg p.o. daily 18.  Crestor 40 mg p.o. daily 19.  Aldactone 25 mg p.o. daily  30-35 minutes were spent completing discharge  summary and discharge planning   Discharge Instructions    Ambulatory referral to Neurology   Complete by: As directed    An appointment is requested in approximately 4 weeks left thalamic infarction   Ambulatory referral to Physical Medicine Rehab   Complete by: As directed    Moderate complexity follow-up 1 to 2 weeks left thalamic infarction      Follow-up Information    Kirsteins, Luanna Salk, MD Follow up.   Specialty: Physical Medicine and Rehabilitation Why: Office to call for appointment Contact information: [place] [address] [phone]           Signed: Cathlyn Parsons 01/09/2020, 4:58 AM

## 2020-01-07 NOTE — Progress Notes (Signed)
Patient claims she did not get her night time snack followed it up with service response.

## 2020-01-08 ENCOUNTER — Inpatient Hospital Stay (HOSPITAL_COMMUNITY): Payer: Medicare Other | Admitting: Occupational Therapy

## 2020-01-08 ENCOUNTER — Inpatient Hospital Stay (HOSPITAL_COMMUNITY): Payer: Medicare Other | Admitting: Physical Therapy

## 2020-01-08 LAB — GLUCOSE, CAPILLARY
Glucose-Capillary: 128 mg/dL — ABNORMAL HIGH (ref 70–99)
Glucose-Capillary: 252 mg/dL — ABNORMAL HIGH (ref 70–99)
Glucose-Capillary: 193 mg/dL — ABNORMAL HIGH (ref 70–99)
Glucose-Capillary: 131 mg/dL — ABNORMAL HIGH (ref 70–99)

## 2020-01-08 MED ORDER — DICLOFENAC SODIUM 1 % EX GEL: 2.0000 g | Freq: Four times a day (QID) | CUTANEOUS | 0 refills | Status: DC

## 2020-01-08 MED ORDER — INSULIN DETEMIR 100 UNIT/ML FLEXPEN: 39.0000 [IU] | PEN_INJECTOR | Freq: Every day | SUBCUTANEOUS | 11 refills | Status: DC

## 2020-01-08 MED ORDER — MONTELUKAST SODIUM 10 MG PO TABS: 10.0000 mg | ORAL_TABLET | ORAL | 0 refills | Status: AC

## 2020-01-08 MED ORDER — OLMESARTAN MEDOXOMIL 20 MG PO TABS: 20.0000 mg | ORAL_TABLET | Freq: Every day | ORAL | 0 refills | Status: DC

## 2020-01-08 MED ORDER — CYCLOSPORINE 0.05 % OP EMUL: 1.0000 [drp] | Freq: Two times a day (BID) | OPHTHALMIC | 0 refills | Status: DC

## 2020-01-08 MED ORDER — CLOPIDOGREL BISULFATE 75 MG PO TABS: 75.0000 mg | ORAL_TABLET | Freq: Every day | ORAL | 0 refills | Status: DC

## 2020-01-08 MED ORDER — SPIRONOLACTONE 25 MG PO TABS: 25.0000 mg | ORAL_TABLET | Freq: Every day | ORAL | 0 refills | Status: DC

## 2020-01-08 MED ORDER — LEVOTHYROXINE SODIUM 137 MCG PO CAPS: 137.0000 ug | ORAL_CAPSULE | Freq: Every day | ORAL | 0 refills | Status: DC

## 2020-01-08 MED ORDER — LORATADINE 10 MG PO TABS: 10.0000 mg | ORAL_TABLET | Freq: Every day | ORAL | 0 refills | Status: DC

## 2020-01-08 MED ORDER — ACETAMINOPHEN 325 MG PO TABS: 650.0000 mg | ORAL_TABLET | ORAL | Status: DC | PRN

## 2020-01-08 MED ORDER — ESOMEPRAZOLE MAGNESIUM 40 MG PO CPDR: 40.0000 mg | DELAYED_RELEASE_CAPSULE | Freq: Two times a day (BID) | ORAL | 0 refills | Status: DC

## 2020-01-08 MED ORDER — ROSUVASTATIN CALCIUM 40 MG PO TABS: 40.0000 mg | ORAL_TABLET | Freq: Every day | ORAL | 0 refills | Status: DC

## 2020-01-08 MED ORDER — INSULIN ASPART 100 UNIT/ML FLEXPEN: 16.0000 [IU] | PEN_INJECTOR | Freq: Three times a day (TID) | SUBCUTANEOUS | 11 refills | Status: DC

## 2020-01-08 MED ORDER — CANAGLIFLOZIN 100 MG PO TABS: 100.0000 mg | ORAL_TABLET | Freq: Every day | ORAL | 0 refills | Status: DC

## 2020-01-08 MED ORDER — LIDOCAINE 5 % EX PTCH: 1.0000 | MEDICATED_PATCH | CUTANEOUS | 0 refills | Status: DC

## 2020-01-08 MED ORDER — EZETIMIBE 10 MG PO TABS: 10.0000 mg | ORAL_TABLET | Freq: Every day | ORAL | 1 refills | Status: DC

## 2020-01-08 NOTE — Progress Notes (Signed)
Occupational Therapy Discharge Summary  Patient Details  Name: [name] MRN: [id number] Date of Birth: 11-12-1950  Today's Date: 01/08/2020 OT Individual Time: 3810-1751 OT Individual Time Calculation (min): 69 min    Patient has met 15 of 15 long term goals due to improved activity tolerance, improved balance, ability to compensate for deficits, functional use of  RIGHT upper and RIGHT lower extremity, improved attention, improved awareness and improved coordination.  Patient to discharge at overall mod I overall with supervision from family for shower transfers level.   Reasons goals not met: all goals met  Recommendation:  Patient will benefit from ongoing skilled OT services in home health setting to continue to advance functional skills in the area of BADL and iADL.  Equipment: TTB  Reasons for discharge: treatment goals met  Patient/family agrees with progress made and goals achieved: Yes   OT Intervention: Upon entering the room, pt supine in bed with no c/o pain but reports fatigue. Pt requesting to shower this session. Pt ambulating with RW at mod I level to obtain all needed items and transfer into shower. Pt sitting on shower chair for bathing with mod I level. OT continues to recommend pt transfer the first few times into/out of shower with sister present for safety. She agrees that this is the safest option. Pt exiting the bathroom and seated on EOB to don clean clothing items. Pt taking intermittent rest breaks secondary to fatigue. OT reviewed recommendation for HHOT and expectations with this setting. Pt asking appropriate questions and then ambulating to sit in recliner chair with RW. OT made mod I in room with staff notified. Call bell and all needed items within reach.   OT Discharge Precautions/Restrictions  Precautions Precautions: Fall Vital Signs Therapy Vitals Temp: 98.5 F (36.9 C) Pulse Rate: 86 Resp: 18 BP: (!) 133/59 Patient Position (if  appropriate): Lying Oxygen Therapy SpO2: 97 % Pain Pain Assessment Pain Scale: 0-10 Pain Score: 0-No pain Vision Baseline Vision/History: Wears glasses Wears Glasses: At all times Patient Visual Report: Diplopia Cognition Overall Cognitive Status: Within Functional Limits for tasks assessed Arousal/Alertness: Awake/alert Orientation Level: Oriented X4 Sensation Sensation Light Touch: Impaired Detail Central sensation comments: parathesia in the upper RLE. Decreased sensation as well in digits 3-5 per pt report Hot/Cold: Impaired Detail Hot/Cold Impaired Details: Impaired RUE;Impaired RLE Proprioception: Appears Intact Stereognosis: Not tested Coordination Gross Motor Movements are Fluid and Coordinated: No Fine Motor Movements are Fluid and Coordinated: No Coordination and Movement Description: but much improvement since initial evaluation Motor  Motor Motor - Discharge Observations: R hemiplegia but much improved from evaluation with strength and coordination being functional for pt to return home at mod I level Mobility  Bed Mobility Bed Mobility: Rolling Right;Rolling Left;Sit to Supine;Supine to Sit Rolling Right: Independent Rolling Left: Independent Supine to Sit: Independent Sit to Supine: Independent Transfers Sit to Stand: Independent with assistive device  Trunk/Postural Assessment  Cervical Assessment Cervical Assessment: Within Functional Limits Thoracic Assessment Thoracic Assessment: Within Functional Limits Lumbar Assessment Lumbar Assessment: Within Functional Limits Postural Control Postural Control: Within Functional Limits  Balance Balance Balance Assessed: Yes Static Sitting Balance Static Sitting - Level of Assistance: 7: Independent Dynamic Sitting Balance Dynamic Sitting - Level of Assistance: 7: Independent Static Standing Balance Static Standing - Level of Assistance: 6: Modified independent (Device/Increase time) Dynamic Standing  Balance Dynamic Standing - Level of Assistance: 6: Modified independent (Device/Increase time) Extremity/Trunk Assessment RUE Assessment RUE Assessment: Within Functional Limits Passive Range of Motion (PROM) Comments:  WFLs Active Range of Motion (AROM) Comments: WFLs General Strength Comments: 3+/5 throuhgout LUE Assessment LUE Assessment: Within Functional Limits   Gypsy Decant 01/08/2020, 8:59 AM

## 2020-01-08 NOTE — Progress Notes (Signed)
[place] PHYSICAL MEDICINE & REHABILITATION PROGRESS NOTE   Subjective/Complaints:    Mod I in room now- no issues.  ROS:  Pt denies SOB, abd pain, CP, N/V/C/D, and vision changes  Objective:   No results found. No results for input(s): WBC, HGB, HCT, PLT in the last 72 hours. No results for input(s): NA, K, CL, CO2, GLUCOSE, BUN, CREATININE, CALCIUM in the last 72 hours.  Intake/Output Summary (Last 24 hours) at 01/08/2020 1923 Last data filed at 01/08/2020 1822 Gross per 24 hour  Intake 720 ml  Output --  Net 720 ml     Physical Exam: Vital Signs Blood pressure (!) 104/59, pulse (!) 110, temperature 98.2 F (36.8 C), temperature source Oral, resp. rate 20, height 5\' 3"  (1.6 m), weight 81.6 kg, SpO2 100 %.  General: standing up in room, with OT, NAD Mood and affect are appropriate Heart: RRR Lungs: CTA B/L Abdomen: Soft, NT, ND, (+)BS  Extremities: No clubbing, cyanosis, or edema Skin: No evidence of breakdown, no evidence of rash 3-/5 right deltoid bicep tricep grip 3-/5 right hip flexor knee extensor to minus right ankle dorsiflexor. A little bit weaker than yesterday. Decreased LT right face/arm > R foot  Musculoskeletal: Full passive range of motion in all 4 extremities. No joint swelling   Assessment/Plan: 1. Functional deficits secondary to left thalamic infarct which require 3+ hours per day of interdisciplinary therapy in a comprehensive inpatient rehab setting.  Physiatrist is providing close team supervision and 24 hour management of active medical problems listed below.  Physiatrist and rehab team continue to assess barriers to discharge/monitor patient progress toward functional and medical goals  Care Tool:  Bathing    Body parts bathed by patient: Right arm, Chest, Abdomen, Right upper leg, Left upper leg, Face, Front perineal area, Buttocks, Left arm, Right lower leg, Left lower leg   Body parts bathed by helper: Right lower leg, Left lower leg      Bathing assist Assist Level: Independent with assistive device     Upper Body Dressing/Undressing Upper body dressing   What is the patient wearing?: Pull over shirt    Upper body assist Assist Level: Independent    Lower Body Dressing/Undressing Lower body dressing      What is the patient wearing?: Underwear/pull up, Pants     Lower body assist Assist for lower body dressing: Independent with assitive device     Toileting Toileting    Toileting assist Assist for toileting: Independent with assistive device     Transfers Chair/bed transfer  Transfers assist     Chair/bed transfer assist level: Independent with assistive device Chair/bed transfer assistive device: Programmer, multimedia   Ambulation assist      Assist level: Independent with assistive device Assistive device: Walker-rolling Max distance: 131ft   Walk 10 feet activity   Assist     Assist level: Independent with assistive device Assistive device: Walker-rolling   Walk 50 feet activity   Assist Walk 50 feet with 2 turns activity did not occur: Safety/medical concerns  Assist level: Independent with assistive device Assistive device: Walker-rolling    Walk 150 feet activity   Assist Walk 150 feet activity did not occur: Safety/medical concerns  Assist level: Independent with assistive device Assistive device: Walker-rolling    Walk 10 feet on uneven surface  activity   Assist Walk 10 feet on uneven surfaces activity did not occur: Safety/medical concerns   Assist level: Independent with assistive device Assistive device:  Walker-rolling   Wheelchair     Assist Will patient use wheelchair at discharge?: No Type of Wheelchair: Manual    Wheelchair assist level: Moderate Assistance - Patient 50 - 74% Max wheelchair distance: 50    Wheelchair 50 feet with 2 turns activity    Assist        Assist Level: Moderate Assistance - Patient 50 - 74%    Wheelchair 150 feet activity     Assist      Assist Level: Maximal Assistance - Patient 25 - 49%   Blood pressure (!) 104/59, pulse (!) 110, temperature 98.2 F (36.8 C), temperature source Oral, resp. rate 20, height 5\' 3"  (1.6 m), weight 81.6 kg, SpO2 100 %.    Medical Problem List and Plan: 1.  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  Antithrombotics: -DVT/anticoagulation: D/ced Lovenox as ambulating 180 feet             -antiplatelet therapy: Aspirin 81 mg daily and Plavix 75 mg daily x3 weeks then aspirin alone 3. Pain Management: Lidoderm patch, Voltaren gel as needed  Some headache, does not want medication 4. Mood: Provide emotional support             -antipsychotic agents: N/A 5. Neuropsych: This patient is capable of making decisions on her own behalf. 6. Skin/Wound Care: Routine skin checks 7. Fluids/Electrolytes/Nutrition: Routine in and outs.   8.  Bilateral carotid stenosis.  Follow-up outpatient vascular as outpatient. 9.  Myasthenia gravis.  Continue Cellcept 1500  prednisone 10 mg daily.  She is followed at [facility] and also receives IVIG at home.  If patient fails progressive bilateral lower extremity weakness may need to perform IVIG infusion in the hospital 10.  Diabetes mellitus with hyperglycemia.  Hemoglobin A1c 9.1.  NovoLog 15 units 3 times daily, Levemir 35 units daily.  Check blood sugars before meals and at bedtime.            CBG (last 3)  Recent Labs    01/08/20 0601 01/08/20 1216 01/08/20 1638  GLUCAP 128* 252* 193*  PM reading elevated will restart Invokana 100mg   (home dose 300mg ) -cont 40U levimir  118 on 5/15  5/16: decrease Levermir to 39U given hypoglycemic event  5/17- variable BGs- will con't current doses 11.  OSA/asthma.  Singulair 10 mg daily,  continue inhalers as directed as well as CPAP.             Monitor with increased exertio n  CPAP resumed 5/8 with improved sleep 12.Hypertension.  Avapro 150 mg daily, Aldactone 25 mg daily.    Vitals:   01/08/20 0602 01/08/20 1332  BP: (!) 133/59 (!) 104/59  Pulse: 86 (!) 110  Resp: 18 20  Temp: 98.5 F (36.9 C) 98.2 F (36.8 C)  SpO2: 97% 100%  5/16 BP well controlled 13.  Hypothyroidism.  Synthroid 14.  Hyperlipidemia.  Zetia/Crestor 15.  GERD.  Protonix  16.  Mild tachy, likely deconditioning normal rthym  LOS: 12 days A FACE TO FACE EVALUATION WAS PERFORMED  Rabab Currington 01/08/2020, 7:23 PM

## 2020-01-08 NOTE — Plan of Care (Signed)
  Problem: Consults Goal: RH STROKE PATIENT EDUCATION Description: See Patient Education module for education specifics  Outcome: Progressing   Problem: RH SKIN INTEGRITY Goal: RH STG SKIN FREE OF INFECTION/BREAKDOWN Outcome: Progressing Goal: RH STG MAINTAIN SKIN INTEGRITY WITH ASSISTANCE Description: STG Maintain Skin Integrity With Assistance. Outcome: Progressing Goal: RH STG ABLE TO PERFORM INCISION/WOUND CARE W/ASSISTANCE Description: STG Able To Perform Incision/Wound Care With Assistance. Outcome: Progressing   Problem: RH SAFETY Goal: RH STG ADHERE TO SAFETY PRECAUTIONS W/ASSISTANCE/DEVICE Description: STG Adhere to Safety Precautions With min Assistance/Device. Outcome: Progressing Goal: RH STG DECREASED RISK OF FALL WITH ASSISTANCE Description: STG Decreased Risk of Fall With min Assistance. Outcome: Progressing   Problem: RH COGNITION-NURSING Goal: RH STG USES MEMORY AIDS/STRATEGIES W/ASSIST TO PROBLEM SOLVE Description: STG Uses Memory Aids/Strategies With min Assistance to Problem Solve. Outcome: Progressing Goal: RH STG ANTICIPATES NEEDS/CALLS FOR ASSIST W/ASSIST/CUES Description: STG Anticipates Needs/Calls for Assist With min Assistance/Cues. Outcome: Progressing   Problem: RH PAIN MANAGEMENT Goal: RH STG PAIN MANAGED AT OR BELOW PT'S PAIN GOAL Description: Pain < 3 Outcome: Progressing

## 2020-01-08 NOTE — Discharge Instructions (Signed)
Inpatient Rehab Discharge Instructions  [name] Discharge date and time: No discharge date for patient encounter.   Activities/Precautions/ Functional Status: Activity: activity as tolerated Diet: diabetic diet Wound Care: none needed Functional status:  ___ No restrictions     ___ Walk up steps independently ___ 24/7 supervision/assistance   ___ Walk up steps with assistance ___ Intermittent supervision/assistance  ___ Bathe/dress independently ___ Walk with walker     _x__ Bathe/dress with assistance ___ Walk Independently    ___ Shower independently ___ Walk with assistance    ___ Shower with assistance ___ No alcohol     ___ Return to work/school ________ COMMUNITY REFERRALS UPON DISCHARGE:    Home Health:   PT     OT                     Agency: [facility] [phone]    Medical Equipment/Items Ordered: Conservation officer, nature and Tub Producer, television/film/video                                                 Agency/Supplier: [employer]   Special Instructions: No driving smoking or alcohol  Plan aspirin and Plavix x3 weeks then aspirin alone STROKE/TIA DISCHARGE INSTRUCTIONS SMOKING Cigarette smoking nearly doubles your risk of having a stroke & is the single most alterable risk factor  If you smoke or have smoked in the last 12 months, you are advised to quit smoking for your health.  Most of the excess cardiovascular risk related to smoking disappears within a year of stopping.  Ask you doctor about anti-smoking medications  [place] Quit Line: 1-800-QUIT NOW  Free Smoking Cessation Classes [phone]  CHOLESTEROL Know your levels; limit fat & cholesterol in your diet  Lipid Panel     Component Value Date/Time   CHOL 193 12/21/2019 0618   CHOL 243 (H) 04/14/2019 1018   TRIG 237 (H) 12/21/2019 0618   HDL 45 12/21/2019 0618   HDL 67 04/14/2019 1018   CHOLHDL 4.3 12/21/2019 0618   VLDL 47 (H) 12/21/2019 0618   LDLCALC 101 (H) 12/21/2019 0618   LDLCALC 146 (H) 04/14/2019 1018        Many patients benefit from treatment even if their cholesterol is at goal.  Goal: Total Cholesterol (CHOL) less than 160  Goal:  Triglycerides (TRIG) less than 150  Goal:  HDL greater than 40  Goal:  LDL (LDLCALC) less than 100   BLOOD PRESSURE American Stroke Association blood pressure target is less that 120/80 mm/Hg  Your discharge blood pressure is:  BP: (!) 134/57  Monitor your blood pressure  Limit your salt and alcohol intake  Many individuals will require more than one medication for high blood pressure  DIABETES (A1c is a blood sugar average for last 3 months) Goal HGBA1c is under 7% (HBGA1c is blood sugar average for last 3 months)  Diabetes:   Lab Results  Component Value Date   HGBA1C 9.1 (H) 12/20/2019     Your HGBA1c can be lowered with medications, healthy diet, and exercise.  Check your blood sugar as directed by your physician  Call your physician if you experience unexplained or low blood sugars.  PHYSICAL ACTIVITY/REHABILITATION Goal is 30 minutes at least 4 days per week  Activity: Increase activity slowly, Therapies: Physical Therapy: Home Health Return to work:   Activity  decreases your risk of heart attack and stroke and makes your heart stronger.  It helps control your weight and blood pressure; helps you relax and can improve your mood.  Participate in a regular exercise program.  Talk with your doctor about the best form of exercise for you (dancing, walking, swimming, cycling).  DIET/WEIGHT Goal is to maintain a healthy weight  Your discharge diet is:  Diet Order            Diet heart healthy/carb modified Room service appropriate? Yes; Fluid consistency: Thin  Diet effective ____              liquids Your height is:    Your current weight is:   Your Body Mass Index (BMI) is:     Following the type of diet specifically designed for you will help prevent another stroke.  Your goal weight range is:    Your goal Body Mass Index  (BMI) is 19-24.  Healthy food habits can help reduce 3 risk factors for stroke:  High cholesterol, hypertension, and excess weight.  RESOURCES Stroke/Support Group:  Call [phone]   STROKE EDUCATION PROVIDED/REVIEWED AND GIVEN TO PATIENT Stroke warning signs and symptoms How to activate emergency medical system (call 911). Medications prescribed at discharge. Need for follow-up after discharge. Personal risk factors for stroke. Pneumonia vaccine given:  Flu vaccine given:  My questions have been answered, the writing is legible, and I understand these instructions.  I will adhere to these goals & educational materials that have been provided to me after my discharge from the hospital.      My questions have been answered and I understand these instructions. I will adhere to these goals and the provided educational materials after my discharge from the hospital.  Patient/Caregiver Signature _______________________________ Date __________  Clinician Signature _______________________________________ Date __________  Please bring this form and your medication list with you to all your follow-up doctor's appointments.

## 2020-01-08 NOTE — Progress Notes (Signed)
Physical Therapy Session Note  Patient Details  Name: [name] MRN: [id number] Date of Birth: September 19, 1950  Today's Date: 01/08/2020 PT Individual Time: 1135-1200 and 1418-1530 PT Individual Time Calculation (min): 25 min and 72 min  Short Term Goals: Week 2:  PT Short Term Goal 1 (Week 2): STG=LTG due to ELOS  Skilled Therapeutic Interventions/Progress Updates: Pt presented at EOB agreeable to therapy with pt now mod I in room. Pt states feeling more fatigued over weekend contributing to not receiving scheduled infusion for Myastenia gravis. Pt transported to rehab gym for energy conservation and performed blocked practice of stairs ascending/descending 4 steps x 2 with [facility] and CGA fading to close S. Discussed with nsg use of RW vs SBQC for stairs with pt in agreement to [place] [facility]. Pt ambulated back to w/c with [facility] and transported back to room. Pt returned to EOB at end of session and left with current needs met.   Tx2: Pt presented in recliner agreeable to therapy. Pt states current HA but in intervention requested at start of session. Frequent and extended rest breaks required this session due to fatigue. Pt transported to ortho gym via w/c for energy conservation and performed ambulation on mulch and car transfer with RW supervision and mod I respectively. Pt also performed TUG with RW and improved score of 27 sec. Pt transported to ADL apt and performed furniture transfer with mod I with RW. Pt transported to rehab gym and participated in balance activities including use of rebounder without AD forward and with rotation. Pt then ambulated to day room and participated in NuStep L2 x 5 min for BLE strengthening and endurance. Performed ambulatory transfer back to w/c and transported back to room. Pt ambulated mod I back to recliner and left with call bell within reach and needs met.      Therapy Documentation Precautions:  Precautions Precautions: Fall Restrictions Weight Bearing  Restrictions: No General:   Vital Signs:  Pain: Pain Assessment Pain Scale: 0-10 Pain Score: 0-No pain Mobility:   Locomotion :    Trunk/Postural Assessment :    Balance: Balance Balance Assessed: Yes Static Sitting Balance Static Sitting - Level of Assistance: 7: Independent Dynamic Sitting Balance Dynamic Sitting - Level of Assistance: 7: Independent Static Standing Balance Static Standing - Level of Assistance: 6: Modified independent (Device/Increase time) Dynamic Standing Balance Dynamic Standing - Level of Assistance: 6: Modified independent (Device/Increase time) Exercises:   Other Treatments:      Therapy/Group: Individual Therapy  Betzaida Cremeens 01/08/2020, 12:50 PM

## 2020-01-08 NOTE — Progress Notes (Signed)
Physical Therapy Discharge Summary  Patient Details  Name: [name] MRN: [id number] Date of Birth: [birth year]  Today's Date: 01/08/2020   Patient has met 9 of 9 long term goals due to improved activity tolerance, improved balance, improved postural control, increased strength, improved attention, improved awareness and improved coordination.  Patient to discharge at an ambulatory level Modified Independent.   Patient's care partner is independent to provide the necessary physical assistance at discharge.  Reasons goals not met: N/A all goals met  Recommendation:  Patient will benefit from ongoing skilled PT services in home health setting to continue to advance safe functional mobility, address ongoing impairments in balance, strength, coordination, gait, community access, safety, and minimize fall risk.  Equipment: Conservation officer, nature  Reasons for discharge: treatment goals met  Patient/family agrees with progress made and goals achieved: Yes  PT Discharge Precautions/Restrictions   Vital Signs Therapy Vitals Temp: 98.2 F (36.8 C) Temp Source: Oral Pulse Rate: (!) 110 Resp: 20 BP: (!) 104/59 Patient Position (if appropriate): Sitting Oxygen Therapy SpO2: 100 % O2 Device: Room Air Pain Pain Assessment Pain Scale: 0-10 Pain Score: 7  Pain Type: Acute pain Pain Location: Head Pain Orientation: Right Pain Descriptors / Indicators: Headache Pain Frequency: Intermittent Pain Onset: Gradual Patients Stated Pain Goal: 2 Pain Intervention(s): Medication (See eMAR)  Cognition Overall Cognitive Status: Within Functional Limits for tasks assessed Arousal/Alertness: Awake/alert Orientation Level: Oriented X4 Sensation Sensation Light Touch: Impaired Detail Central sensation comments: parathesia in the upper RLE. Decreased sensation as well in digits 3-5 per pt report Proprioception: Appears Intact Motor  Motor Motor: Hemiplegia;Abnormal postural alignment and  control Motor - Discharge Observations: significant improvement in overall R hemiplegia  Mobility Bed Mobility Bed Mobility: Rolling Right;Rolling Left;Sit to Supine;Supine to Sit Rolling Right: Independent Rolling Left: Independent Supine to Sit: Independent Sit to Supine: Independent Transfers Transfers: Sit to [employer] Transfers Sit to Stand: Independent with assistive device Stand Pivot Transfers: Independent with assistive device Transfer (Assistive device): Rolling walker Locomotion  Gait Ambulation: Yes Gait Assistance: Independent with assistive device Gait Distance (Feet): 150 Feet Assistive device: Rolling walker Gait Gait: Yes Gait Pattern: Impaired Gait Pattern: Decreased step length - right;Poor foot clearance - right Gait velocity: decreased self selected speed Stairs / Additional Locomotion Stairs: Yes Stairs Assistance: Supervision/Verbal cueing Stair Management Technique: With cane Number of Stairs: 4 Height of Stairs: 6 Ramp: Independent with assistive device Wheelchair Mobility Wheelchair Mobility: No  Trunk/Postural Assessment  Cervical Assessment Cervical Assessment: Within Functional Limits Thoracic Assessment Thoracic Assessment: Within Functional Limits Lumbar Assessment Lumbar Assessment: Within Functional Limits Postural Control Postural Control: Within Functional Limits  Balance Balance Balance Assessed: Yes Standardized Balance Assessment Standardized Balance Assessment: Timed Up and Go Test Timed Up and Go Test TUG: Normal TUG Normal TUG (seconds): 27 Static Sitting Balance Static Sitting - Level of Assistance: 7: Independent Dynamic Sitting Balance Dynamic Sitting - Level of Assistance: 7: Independent Static Standing Balance Static Standing - Balance Support: No upper extremity supported Static Standing - Level of Assistance: 6: Modified independent (Device/Increase time) Dynamic Standing Balance Dynamic Standing -  Balance Support: No upper extremity supported Dynamic Standing - Level of Assistance: 6: Modified independent (Device/Increase time) Extremity Assessment  RUE Assessment RUE Assessment: Within Functional Limits Passive Range of Motion (PROM) Comments: WFLs Active Range of Motion (AROM) Comments: WFLs General Strength Comments: 3+/5 throuhgout LUE Assessment LUE Assessment: Within Functional Limits RLE Assessment RLE Assessment: Exceptions to [facility] General Strength Comments: grossly 4/5 LLE Assessment LLE  Assessment: Within Functional Limits    Rosita DeChalus 01/08/2020, 4:22 PM

## 2020-01-09 LAB — GLUCOSE, CAPILLARY
Glucose-Capillary: 118 mg/dL — ABNORMAL HIGH (ref 70–99)
Glucose-Capillary: 145 mg/dL — ABNORMAL HIGH (ref 70–99)

## 2020-01-09 MED ORDER — HEPARIN SOD (PORK) LOCK FLUSH 100 UNIT/ML IV SOLN
500.0000 [IU] | INTRAVENOUS | Status: AC | PRN
  Administered 2020-01-09: 500 [IU]
  Filled 2020-01-09: qty 5

## 2020-01-09 NOTE — Progress Notes (Signed)
Patient d/c form facility via wheelchair. Walker was delivered to room before discharge. Pt sister packed all belongings and nurse escorted patient to sister's care.

## 2020-01-09 NOTE — Plan of Care (Signed)
  Problem: Consults Goal: RH STROKE PATIENT EDUCATION Description: See Patient Education module for education specifics  01/09/2020 1522 by Renda Rolls L, LPN Outcome: Completed/Met 01/09/2020 0921 by Renda Rolls L, LPN Outcome: Progressing   Problem: RH SKIN INTEGRITY Goal: RH STG SKIN FREE OF INFECTION/BREAKDOWN 01/09/2020 1522 by Renda Rolls L, LPN Outcome: Completed/Met 01/09/2020 0921 by Sanda Linger, LPN Outcome: Progressing Goal: RH STG MAINTAIN SKIN INTEGRITY WITH ASSISTANCE Description: STG Maintain Skin Integrity With Assistance. 01/09/2020 1522 by Renda Rolls L, LPN Outcome: Completed/Met 01/09/2020 0921 by Sanda Linger, LPN Outcome: Progressing Goal: RH STG ABLE TO PERFORM INCISION/WOUND CARE W/ASSISTANCE Description: STG Able To Perform Incision/Wound Care With Assistance. 01/09/2020 1522 by Renda Rolls L, LPN Outcome: Completed/Met 01/09/2020 0921 by Sanda Linger, LPN Outcome: Progressing   Problem: RH SAFETY Goal: RH STG ADHERE TO SAFETY PRECAUTIONS W/ASSISTANCE/DEVICE Description: STG Adhere to Safety Precautions With min Assistance/Device. 01/09/2020 1522 by Renda Rolls L, LPN Outcome: Completed/Met 01/09/2020 0921 by Renda Rolls L, LPN Outcome: Progressing Goal: RH STG DECREASED RISK OF FALL WITH ASSISTANCE Description: STG Decreased Risk of Fall With min Assistance. 01/09/2020 1522 by Renda Rolls L, LPN Outcome: Completed/Met 01/09/2020 0921 by Sanda Linger, LPN Outcome: Progressing   Problem: RH COGNITION-NURSING Goal: RH STG USES MEMORY AIDS/STRATEGIES W/ASSIST TO PROBLEM SOLVE Description: STG Uses Memory Aids/Strategies With min Assistance to Problem Solve. 01/09/2020 1522 by Renda Rolls L, LPN Outcome: Completed/Met 01/09/2020 0921 by Sanda Linger, LPN Outcome: Progressing Goal: RH STG ANTICIPATES NEEDS/CALLS FOR ASSIST W/ASSIST/CUES Description: STG Anticipates Needs/Calls for Assist With min Assistance/Cues. 01/09/2020 1522 by Renda Rolls L, LPN Outcome: Completed/Met 01/09/2020 0921 by Sanda Linger, LPN Outcome: Progressing   Problem: RH PAIN MANAGEMENT Goal: RH STG PAIN MANAGED AT OR BELOW PT'S PAIN GOAL Description: Pain < 3 01/09/2020 1522 by Renda Rolls L, LPN Outcome: Completed/Met 01/09/2020 0921 by Sanda Linger, LPN Outcome: Progressing

## 2020-01-09 NOTE — Plan of Care (Signed)
  Problem: Consults Goal: RH STROKE PATIENT EDUCATION Description: See Patient Education module for education specifics  Outcome: Progressing   Problem: RH SKIN INTEGRITY Goal: RH STG SKIN FREE OF INFECTION/BREAKDOWN Outcome: Progressing Goal: RH STG MAINTAIN SKIN INTEGRITY WITH ASSISTANCE Description: STG Maintain Skin Integrity With Assistance. Outcome: Progressing Goal: RH STG ABLE TO PERFORM INCISION/WOUND CARE W/ASSISTANCE Description: STG Able To Perform Incision/Wound Care With Assistance. Outcome: Progressing   Problem: RH SAFETY Goal: RH STG ADHERE TO SAFETY PRECAUTIONS W/ASSISTANCE/DEVICE Description: STG Adhere to Safety Precautions With min Assistance/Device. Outcome: Progressing Goal: RH STG DECREASED RISK OF FALL WITH ASSISTANCE Description: STG Decreased Risk of Fall With min Assistance. Outcome: Progressing   Problem: RH COGNITION-NURSING Goal: RH STG USES MEMORY AIDS/STRATEGIES W/ASSIST TO PROBLEM SOLVE Description: STG Uses Memory Aids/Strategies With min Assistance to Problem Solve. Outcome: Progressing Goal: RH STG ANTICIPATES NEEDS/CALLS FOR ASSIST W/ASSIST/CUES Description: STG Anticipates Needs/Calls for Assist With min Assistance/Cues. Outcome: Progressing   Problem: RH PAIN MANAGEMENT Goal: RH STG PAIN MANAGED AT OR BELOW PT'S PAIN GOAL Description: Pain < 3 Outcome: Progressing

## 2020-01-09 NOTE — Progress Notes (Addendum)
[place] PHYSICAL MEDICINE & REHABILITATION PROGRESS NOTE   Subjective/Complaints:  Mrs. Goos has no complaints this morning.  Her sister will be picking up her up today. She asks about post-discharge plan of care   ROS:  Pt denies SOB, abd pain, CP, N/V/C/D, and vision changes  Objective:   No results found. No results for input(s): WBC, HGB, HCT, PLT in the last 72 hours. No results for input(s): NA, K, CL, CO2, GLUCOSE, BUN, CREATININE, CALCIUM in the last 72 hours.  Intake/Output Summary (Last 24 hours) at 01/09/2020 1017 Last data filed at 01/09/2020 0800 Gross per 24 hour  Intake 720 ml  Output --  Net 720 ml     Physical Exam: Vital Signs Blood pressure (!) 133/59, pulse 92, temperature 98.4 F (36.9 C), resp. rate 18, height 5\' 3"  (1.6 m), weight 81.6 kg, SpO2 100 %.  General: lying in bed comfortably Mood and affect are appropriate Heart: RRR Lungs: CTA B/L Abdomen: Soft, NT, ND, (+)BS  Extremities: No clubbing, cyanosis, or edema Skin: No evidence of breakdown, no evidence of rash 3-/5 right deltoid bicep tricep grip 3-/5 right hip flexor knee extensor to minus right ankle dorsiflexor. A little bit weaker than yesterday. Decreased LT right face/arm > R foot  Musculoskeletal: Full passive range of motion in all 4 extremities. No joint swelling  Assessment/Plan: 1. Functional deficits secondary to left thalamic infarct which require 3+ hours per day of interdisciplinary therapy in a comprehensive inpatient rehab setting.  Physiatrist is providing close team supervision and 24 hour management of active medical problems listed below.  Physiatrist and rehab team continue to assess barriers to discharge/monitor patient progress toward functional and medical goals  Care Tool:  Bathing    Body parts bathed by patient: Right arm, Chest, Abdomen, Right upper leg, Left upper leg, Face, Front perineal area, Buttocks, Left arm, Right lower leg, Left lower leg    Body parts bathed by helper: Right lower leg, Left lower leg     Bathing assist Assist Level: Independent with assistive device     Upper Body Dressing/Undressing Upper body dressing   What is the patient wearing?: Pull over shirt    Upper body assist Assist Level: Independent    Lower Body Dressing/Undressing Lower body dressing      What is the patient wearing?: Underwear/pull up, Pants     Lower body assist Assist for lower body dressing: Independent with assitive device     Toileting Toileting    Toileting assist Assist for toileting: Independent with assistive device     Transfers Chair/bed transfer  Transfers assist     Chair/bed transfer assist level: Independent with assistive device Chair/bed transfer assistive device: Programmer, multimedia   Ambulation assist      Assist level: Independent with assistive device Assistive device: Walker-rolling Max distance: 126ft   Walk 10 feet activity   Assist     Assist level: Independent with assistive device Assistive device: Walker-rolling   Walk 50 feet activity   Assist Walk 50 feet with 2 turns activity did not occur: Safety/medical concerns  Assist level: Independent with assistive device Assistive device: Walker-rolling    Walk 150 feet activity   Assist Walk 150 feet activity did not occur: Safety/medical concerns  Assist level: Independent with assistive device Assistive device: Walker-rolling    Walk 10 feet on uneven surface  activity   Assist Walk 10 feet on uneven surfaces activity did not occur: Safety/medical concerns  Assist level: Independent with assistive device Assistive device: Aeronautical engineer Will patient use wheelchair at discharge?: No Type of Wheelchair: Manual    Wheelchair assist level: Moderate Assistance - Patient 50 - 74% Max wheelchair distance: 50    Wheelchair 50 feet with 2 turns  activity    Assist        Assist Level: Moderate Assistance - Patient 50 - 74%   Wheelchair 150 feet activity     Assist      Assist Level: Maximal Assistance - Patient 25 - 49%   Blood pressure (!) 133/59, pulse 92, temperature 98.4 F (36.9 C), resp. rate 18, height 5\' 3"  (1.6 m), weight 81.6 kg, SpO2 100 %.    Medical Problem List and Plan: 1.  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 Transitional care f/u with Dr. Letta Pate in 1-2 weeks.  2.  Antithrombotics: -DVT/anticoagulation: D/ced Lovenox as ambulating 180 feet             -antiplatelet therapy: Aspirin 81 mg daily and Plavix 75 mg daily x3 weeks then aspirin alone 3. Pain Management: Lidoderm patch, Voltaren gel as needed  Denies pain 4. Mood: Provide emotional support             -antipsychotic agents: N/A 5. Neuropsych: This patient is capable of making decisions on her own behalf. 6. Skin/Wound Care: Routine skin checks 7. Fluids/Electrolytes/Nutrition: Routine in and outs.   8.  Bilateral carotid stenosis.  Follow-up outpatient vascular as outpatient. 9.  Myasthenia gravis.  Continue Cellcept 1500  prednisone 10 mg daily.  She is followed at [facility] and also receives IVIG at home.  If patient fails progressive bilateral lower extremity weakness may need to perform IVIG infusion in the hospital 10.  Diabetes mellitus with hyperglycemia.  Hemoglobin A1c 9.1.  NovoLog 15 units 3 times daily, Levemir 35 units daily.  Check blood sugars before meals and at bedtime.            CBG (last 3)  Recent Labs    01/08/20 1638 01/08/20 2051 01/09/20 0614  GLUCAP 193* 131* 118*  PM reading elevated will restart Invokana 100mg   (home dose 300mg ) -cont 40U levimir  118 on 5/15  5/16: decrease Levermir to 39U given hypoglycemic event  5/17- variable BGs-  will con't current doses  5/18: well controlled 11.  OSA/asthma.  Singulair 10 mg daily, continue inhalers as directed as well as CPAP.             Monitor with increased exertio n  CPAP resumed 5/8 with improved sleep 12.Hypertension.  Avapro 150 mg daily, Aldactone 25 mg daily.    Vitals:   01/08/20 1939 01/09/20 0615  BP: (!) 118/43 (!) 133/59  Pulse: 89 92  Resp: 17 18  Temp: 98.1 F (36.7 C) 98.4 F (36.9 C)  SpO2: 98% 100%  5/18 BP well controlled 13.  Hypothyroidism.  Synthroid 14.  Hyperlipidemia.  Zetia/Crestor 15.  GERD.  Protonix  16.  Mild tachy, likely deconditioning normal rthym  >30 minutes spent on education regarding medications, follow-up appointments, review of vitals, discussion of post-discharge care. All questions answered.   LOS: 13 days  A FACE TO FACE EVALUATION WAS PERFORMED  Izora Ribas 01/09/2020, 10:17 AM

## 2020-01-09 NOTE — Progress Notes (Signed)
Inpatient Rehab Care Coordinator Discharge Note Inpatient Rehabilitation Care Coordinator  Discharge Note  The overall goal for the admission was met for:   Discharge location: Yes, Home  Length of Stay: Yes, 13 Days  Discharge activity level: Yes  Home/community participation: Yes  Services provided included: MD, RD, PT, OT, SLP, RN, CM, TR, Pharmacy, Neuropsych and SW  Financial Services: Private Insurance: [place]  Follow-up services arranged: Home Health: [facility]  Comments (or additional information): PT OT  Patient/Family verbalized understanding of follow-up arrangements: Yes  Individual responsible for coordination of the follow-up plan: self, [phone] Confirmed correct DME delivered: Dyanne Iha 01/09/2020    Dyanne Iha

## 2020-01-11 ENCOUNTER — Telehealth: Payer: Self-pay | Admitting: *Deleted

## 2020-01-11 NOTE — Telephone Encounter (Addendum)
Transitional Care call--I spoke with [name]    1. Are you/is patient experiencing any problems since coming home? Are there any questions regarding any aspect of care? Yes and no. Her biggest concern is not being on the medications she was taking previously.  Her diabetic insulins and medication is all different.  She is reaching out to her endocrinologist to get it straightened out. 2. Are there any questions regarding medications administration/dosing? Are meds being taken as prescribed? Patient should review meds with caller to confirm.  Yes see above.  She has all but the diabetic insulin (they put her on Levimir and she does not want to buy it if it is going to be changed back by her regular diabetic MD. 3. Have there been any falls? 4. Has Home Health been to the house and/or have they contacted you? If not, have you tried to contact them? Can we help you contact them? Yes they have just called her prior to this call. 5. Are bowels and bladder emptying properly? Are there any unexpected incontinence issues? If applicable, is patient following bowel/bladder programs? No problems 6. Any fevers, problems with breathing, unexpected pain? No 7. Are there any skin problems or new areas of breakdown? No 8. Has the patient/family member arranged specialty MD follow up (ie cardiology/neurology/renal/surgical/etc)?  Can we help arrange? She will take care of arranging. Her appt to return to our office needs to be changed. I will have [name] call her in the morning. 9. Does the patient need any other services or support that we can help arrange? No 10. Are caregivers following through as expected in assisting the patient? Yes 11. Has the patient quit smoking, drinking alcohol, or using drugs as recommended? NA  Appointment timeTBD. She cannot come to the 01/19/20 appt. [place]

## 2020-01-17 ENCOUNTER — Other Ambulatory Visit: Payer: Self-pay

## 2020-01-17 ENCOUNTER — Encounter: Payer: Medicare Other | Attending: Registered Nurse | Admitting: Registered Nurse

## 2020-01-17 VITALS — BP 166/89 | HR 86 | Temp 98.2°F | Ht 63.0 in | Wt 191.4 lb

## 2020-01-17 DIAGNOSIS — I10 Essential (primary) hypertension: Secondary | ICD-10-CM | POA: Diagnosis not present

## 2020-01-17 DIAGNOSIS — G7000 Myasthenia gravis without (acute) exacerbation: Secondary | ICD-10-CM | POA: Diagnosis not present

## 2020-01-17 DIAGNOSIS — I639 Cerebral infarction, unspecified: Secondary | ICD-10-CM | POA: Diagnosis not present

## 2020-01-17 DIAGNOSIS — Z794 Long term (current) use of insulin: Secondary | ICD-10-CM | POA: Diagnosis not present

## 2020-01-17 DIAGNOSIS — I6381 Other cerebral infarction due to occlusion or stenosis of small artery: Secondary | ICD-10-CM

## 2020-01-17 DIAGNOSIS — E119 Type 2 diabetes mellitus without complications: Secondary | ICD-10-CM | POA: Diagnosis not present

## 2020-01-17 NOTE — Progress Notes (Signed)
Subjective:    Patient ID: [name], female    DOB: 05-Jul-1951, 69 y.o.   MRN: [id number]  HPI: [name] is a 69 y.o. female who is here for Transitional care visit in follow up of her Thalamic Stroke, Left Thalamic Infarction, Myasthenia Gravis and Essential Hypertension. She presented to [facility] on 12/20/2019 with right side weakness, and right facial numbness, she was transferred to [facility]. Neurology was consulted.  MRI Brain WO Contrast:  IMPRESSION: 7 mm acute infarct within the left thalamus.  MRI Chest Angio:  IMPRESSION: VASCULAR  1. No evidence of acute aortic dissection, aortic aneurysm or other acute vascular abnormality. 2. 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 3. Heterogeneous and irregular/ulcerated atherosclerotic plaque along the descending thoracic aorta. 4. Concentric hypertrophy of the left ventricular myocardium suggests chronic systolic hypertension.  NON-VASCULAR  1. No acute abnormality within the visualized upper abdomen. 2. Simple right upper pole renal cyst.  [name] was admitted to inpatient Rehabilitation on 12/27/2019 and discharged home on 01/09/2020. She is receiving outpatient therapy with [facility]. She states she has a constant headache. She rates her pain 5. Also reports her appetite is fair.     Pain Inventory Average Pain 5 Pain Right Now 5 My pain is stabbing and aching  In the last 24 hours, has pain interfered with the following? General activity 5 Relation with others 0 Enjoyment of life 5 What TIME of day is your pain at its worst? varies Sleep (in general) Fair  Pain is worse with: none Pain improves with: medication Relief from Meds: 5  Mobility walk with assistance use a walker Do you have any goals in this area?  yes  Function retired I need assistance with the following:   shopping  Neuro/Psych trouble walking  Prior Studies TC appt  Physicians involved in your care TC appt   Family History  Problem Relation Age of Onset  . Coronary artery disease Sister        s/p coronary stenting  . Stroke Sister 45  . Diabetes Sister   . Congestive Heart Failure Mother   . Asthma Mother   . Diabetes Mother   . Congestive Heart Failure Father   . Lung cancer Father   . Asthma Father   . Diabetes Father   . Diabetes Brother    Social History   Socioeconomic History  . Marital status: Single    Spouse name: Not on file  . Number of children: 0  . Years of education: college  . Highest education level: Not on file  Occupational History  . Occupation: retired  Tobacco Use  . Smoking status: Never Smoker  . Smokeless tobacco: Never Used  Substance and Sexual Activity  . Alcohol use: No    Alcohol/week: 0.0 standard drinks  . Drug use: No  . Sexual activity: Never  Other Topics Concern  . Not on file  Social History Narrative   Caffeine none.   Social Determinants of Health   Financial Resource Strain:   . Difficulty of Paying Living Expenses:   Food Insecurity:   . Worried About Charity fundraiser in the Last Year:   . Arboriculturist in the Last Year:   Transportation Needs:   . Film/video editor (Medical):   Marland Kitchen Lack of Transportation (Non-Medical):   Physical Activity:   . Days of Exercise per Week:   .  Minutes of Exercise per Session:   Stress:   . Feeling of Stress :   Social Connections:   . Frequency of Communication with Friends and Family:   . Frequency of Social Gatherings with Friends and Family:   . Attends Religious Services:   . Active Member of Clubs or Organizations:   . Attends Archivist Meetings:   Marland Kitchen Marital Status:    Past Surgical History:  Procedure Laterality Date  . ABDOMINAL HYSTERECTOMY    . ANTERIOR CERVICAL DECOMP/DISCECTOMY FUSION     "I've had severa ORs; always went in from the  front" (03/13/2013)  . APPENDECTOMY    . CARDIAC CATHETERIZATION     "several" (03/13/2013)  . CARPAL TUNNEL RELEASE Right   . CATARACT EXTRACTION W/ INTRAOCULAR LENS  IMPLANT, BILATERAL Bilateral   . CHOLECYSTECTOMY    . KNEE ARTHROSCOPY Left   . SHOULDER ARTHROSCOPY W/ ROTATOR CUFF REPAIR Left   . TONSILLECTOMY    . TOTAL THYROIDECTOMY     Past Medical History:  Diagnosis Date  . Arthritis    "all over my body" (03/13/2013)  . Asthma   . Asymptomatic carotid artery stenosis, bilateral 10/08/2018  . GERD (gastroesophageal reflux disease)   . H/O hiatal hernia   . Heart murmur   . Hypercholesterolemia 10/08/2018  . Hypertension   . Hypothyroidism   . Myasthenia gravis ([place])    "in my eyes; diagnsosed > 7 yr ago" (03/13/2013)  . Sleep apnea    on CPAP  . Thyroid carcinoma ([place])   . Type II diabetes mellitus (HCC)    BP (!) 166/89   Pulse 86   Temp 98.2 F (36.8 C)   Ht 5\' 3"  (1.6 m)   Wt 191 lb 6.4 oz (86.8 kg)   SpO2 97%   BMI 33.90 kg/m   Opioid Risk Score:   Fall Risk Score:  `1  Depression screen PHQ 2/9  Depression screen PHQ 2/9 01/17/2020  Decreased Interest 1  Down, Depressed, Hopeless 0  PHQ - 2 Score 1  Altered sleeping 0  Tired, decreased energy 1  Change in appetite 0  Feeling bad or failure about yourself  0  Trouble concentrating 0  Moving slowly or fidgety/restless 0  Suicidal thoughts 0  PHQ-9 Score 2  Difficult doing work/chores Not difficult at all    Review of Systems  Respiratory: Positive for apnea.   Musculoskeletal: Positive for gait problem.  All other systems reviewed and are negative.      Objective:   Physical Exam Vitals and nursing note reviewed.  Constitutional:      Appearance: Normal appearance.  Cardiovascular:     Rate and Rhythm: Normal rate and regular rhythm.     Pulses: Normal pulses.     Heart sounds: Normal heart sounds.  Pulmonary:     Effort: Pulmonary effort is normal.     Breath sounds: Normal breath  sounds.  Musculoskeletal:     Cervical back: Normal range of motion and neck supple.     Comments: Normal Muscle Bulk and Muscle Testing Reveals:  Upper Extremities: Right: Decreased ROM  30 Degrees and Muscle Strength 1/5 Left Upper Extremity: Full ROM and Muscle Strength 5/5 Lower Extremities: Right: Decreased ROM and Muscle Strength 4/5 Left Lower Extremity: Full ROM and Muscle Strength 5/5 Arises from Table Slowly (Right Foot Drop)  Toe Dragged Noted   Skin:    General: Skin is warm and dry.  Neurological:     Mental  Status: She is alert and oriented to person, place, and time.  Psychiatric:        Mood and Affect: Mood normal.        Behavior: Behavior normal.           Assessment & Plan:  1.Thalamic Stroke: Left Thalamic Infarction: Continue Home Health Therapy: F/U With Neurology. Continue to Monitor.  2. Myasthenia Gravis: Continue current medication regimen with Cellcept and  IVIG at [facility]: Neurology Following.  3. Essential Hypertension.PCP Following. Continue to Monitor.   20 minutes of face to face patient care time was spent during this visit. All questions were encouraged and answered.  F/U in 4-6 weeks with Dr Letta Pate

## 2020-01-18 ENCOUNTER — Encounter: Payer: Medicare Other | Admitting: Registered Nurse

## 2020-01-18 DIAGNOSIS — M542 Cervicalgia: Secondary | ICD-10-CM | POA: Diagnosis not present

## 2020-01-18 DIAGNOSIS — Z9089 Acquired absence of other organs: Secondary | ICD-10-CM | POA: Diagnosis not present

## 2020-01-18 DIAGNOSIS — G7000 Myasthenia gravis without (acute) exacerbation: Secondary | ICD-10-CM | POA: Diagnosis not present

## 2020-01-18 DIAGNOSIS — I119 Hypertensive heart disease without heart failure: Secondary | ICD-10-CM | POA: Diagnosis not present

## 2020-01-18 DIAGNOSIS — E1165 Type 2 diabetes mellitus with hyperglycemia: Secondary | ICD-10-CM | POA: Diagnosis not present

## 2020-01-18 DIAGNOSIS — Z794 Long term (current) use of insulin: Secondary | ICD-10-CM | POA: Diagnosis not present

## 2020-01-18 DIAGNOSIS — G894 Chronic pain syndrome: Secondary | ICD-10-CM | POA: Diagnosis not present

## 2020-01-18 DIAGNOSIS — Z85850 Personal history of malignant neoplasm of thyroid: Secondary | ICD-10-CM | POA: Diagnosis not present

## 2020-01-18 DIAGNOSIS — K219 Gastro-esophageal reflux disease without esophagitis: Secondary | ICD-10-CM | POA: Diagnosis not present

## 2020-01-18 DIAGNOSIS — I69351 Hemiplegia and hemiparesis following cerebral infarction affecting right dominant side: Secondary | ICD-10-CM | POA: Diagnosis not present

## 2020-01-18 DIAGNOSIS — I6523 Occlusion and stenosis of bilateral carotid arteries: Secondary | ICD-10-CM | POA: Diagnosis not present

## 2020-01-18 DIAGNOSIS — E785 Hyperlipidemia, unspecified: Secondary | ICD-10-CM | POA: Diagnosis not present

## 2020-01-18 DIAGNOSIS — M150 Primary generalized (osteo)arthritis: Secondary | ICD-10-CM | POA: Diagnosis not present

## 2020-01-18 DIAGNOSIS — Z7982 Long term (current) use of aspirin: Secondary | ICD-10-CM | POA: Diagnosis not present

## 2020-01-18 DIAGNOSIS — E890 Postprocedural hypothyroidism: Secondary | ICD-10-CM | POA: Diagnosis not present

## 2020-01-18 DIAGNOSIS — G4733 Obstructive sleep apnea (adult) (pediatric): Secondary | ICD-10-CM | POA: Diagnosis not present

## 2020-01-18 DIAGNOSIS — J45909 Unspecified asthma, uncomplicated: Secondary | ICD-10-CM | POA: Diagnosis not present

## 2020-01-19 ENCOUNTER — Telehealth: Payer: Self-pay

## 2020-01-19 ENCOUNTER — Encounter: Payer: Medicare Other | Admitting: Registered Nurse

## 2020-01-19 DIAGNOSIS — G7000 Myasthenia gravis without (acute) exacerbation: Secondary | ICD-10-CM | POA: Diagnosis not present

## 2020-01-19 NOTE — Telephone Encounter (Signed)
[name] [facility] PT wants verbal for PT 1 X week for 9 weeks [phone]

## 2020-01-19 NOTE — Telephone Encounter (Signed)
Order approved and given per discharge summary.

## 2020-01-23 DIAGNOSIS — Z7982 Long term (current) use of aspirin: Secondary | ICD-10-CM | POA: Diagnosis not present

## 2020-01-23 DIAGNOSIS — Z9089 Acquired absence of other organs: Secondary | ICD-10-CM | POA: Diagnosis not present

## 2020-01-23 DIAGNOSIS — Z794 Long term (current) use of insulin: Secondary | ICD-10-CM | POA: Diagnosis not present

## 2020-01-23 DIAGNOSIS — K219 Gastro-esophageal reflux disease without esophagitis: Secondary | ICD-10-CM | POA: Diagnosis not present

## 2020-01-23 DIAGNOSIS — Z85850 Personal history of malignant neoplasm of thyroid: Secondary | ICD-10-CM | POA: Diagnosis not present

## 2020-01-23 DIAGNOSIS — G894 Chronic pain syndrome: Secondary | ICD-10-CM | POA: Diagnosis not present

## 2020-01-23 DIAGNOSIS — I69351 Hemiplegia and hemiparesis following cerebral infarction affecting right dominant side: Secondary | ICD-10-CM | POA: Diagnosis not present

## 2020-01-23 DIAGNOSIS — E890 Postprocedural hypothyroidism: Secondary | ICD-10-CM | POA: Diagnosis not present

## 2020-01-23 DIAGNOSIS — I119 Hypertensive heart disease without heart failure: Secondary | ICD-10-CM | POA: Diagnosis not present

## 2020-01-23 DIAGNOSIS — J45909 Unspecified asthma, uncomplicated: Secondary | ICD-10-CM | POA: Diagnosis not present

## 2020-01-23 DIAGNOSIS — G4733 Obstructive sleep apnea (adult) (pediatric): Secondary | ICD-10-CM | POA: Diagnosis not present

## 2020-01-23 DIAGNOSIS — E1165 Type 2 diabetes mellitus with hyperglycemia: Secondary | ICD-10-CM | POA: Diagnosis not present

## 2020-01-23 DIAGNOSIS — E785 Hyperlipidemia, unspecified: Secondary | ICD-10-CM | POA: Diagnosis not present

## 2020-01-23 DIAGNOSIS — M150 Primary generalized (osteo)arthritis: Secondary | ICD-10-CM | POA: Diagnosis not present

## 2020-01-23 DIAGNOSIS — M542 Cervicalgia: Secondary | ICD-10-CM | POA: Diagnosis not present

## 2020-01-23 DIAGNOSIS — I6523 Occlusion and stenosis of bilateral carotid arteries: Secondary | ICD-10-CM | POA: Diagnosis not present

## 2020-01-23 DIAGNOSIS — G7000 Myasthenia gravis without (acute) exacerbation: Secondary | ICD-10-CM | POA: Diagnosis not present

## 2020-01-24 DIAGNOSIS — K219 Gastro-esophageal reflux disease without esophagitis: Secondary | ICD-10-CM | POA: Diagnosis not present

## 2020-01-24 DIAGNOSIS — G894 Chronic pain syndrome: Secondary | ICD-10-CM | POA: Diagnosis not present

## 2020-01-24 DIAGNOSIS — M542 Cervicalgia: Secondary | ICD-10-CM | POA: Diagnosis not present

## 2020-01-24 DIAGNOSIS — I6523 Occlusion and stenosis of bilateral carotid arteries: Secondary | ICD-10-CM | POA: Diagnosis not present

## 2020-01-24 DIAGNOSIS — Z7982 Long term (current) use of aspirin: Secondary | ICD-10-CM | POA: Diagnosis not present

## 2020-01-24 DIAGNOSIS — G7000 Myasthenia gravis without (acute) exacerbation: Secondary | ICD-10-CM | POA: Diagnosis not present

## 2020-01-24 DIAGNOSIS — E890 Postprocedural hypothyroidism: Secondary | ICD-10-CM | POA: Diagnosis not present

## 2020-01-24 DIAGNOSIS — Z85850 Personal history of malignant neoplasm of thyroid: Secondary | ICD-10-CM | POA: Diagnosis not present

## 2020-01-24 DIAGNOSIS — J45909 Unspecified asthma, uncomplicated: Secondary | ICD-10-CM | POA: Diagnosis not present

## 2020-01-24 DIAGNOSIS — I119 Hypertensive heart disease without heart failure: Secondary | ICD-10-CM | POA: Diagnosis not present

## 2020-01-24 DIAGNOSIS — E1165 Type 2 diabetes mellitus with hyperglycemia: Secondary | ICD-10-CM | POA: Diagnosis not present

## 2020-01-24 DIAGNOSIS — I69351 Hemiplegia and hemiparesis following cerebral infarction affecting right dominant side: Secondary | ICD-10-CM | POA: Diagnosis not present

## 2020-01-24 DIAGNOSIS — Z9089 Acquired absence of other organs: Secondary | ICD-10-CM | POA: Diagnosis not present

## 2020-01-24 DIAGNOSIS — G4733 Obstructive sleep apnea (adult) (pediatric): Secondary | ICD-10-CM | POA: Diagnosis not present

## 2020-01-24 DIAGNOSIS — E785 Hyperlipidemia, unspecified: Secondary | ICD-10-CM | POA: Diagnosis not present

## 2020-01-24 DIAGNOSIS — Z794 Long term (current) use of insulin: Secondary | ICD-10-CM | POA: Diagnosis not present

## 2020-01-24 DIAGNOSIS — M150 Primary generalized (osteo)arthritis: Secondary | ICD-10-CM | POA: Diagnosis not present

## 2020-01-29 DIAGNOSIS — E890 Postprocedural hypothyroidism: Secondary | ICD-10-CM | POA: Diagnosis not present

## 2020-01-29 DIAGNOSIS — M150 Primary generalized (osteo)arthritis: Secondary | ICD-10-CM | POA: Diagnosis not present

## 2020-01-29 DIAGNOSIS — J45909 Unspecified asthma, uncomplicated: Secondary | ICD-10-CM | POA: Diagnosis not present

## 2020-01-29 DIAGNOSIS — Z7982 Long term (current) use of aspirin: Secondary | ICD-10-CM | POA: Diagnosis not present

## 2020-01-29 DIAGNOSIS — Z794 Long term (current) use of insulin: Secondary | ICD-10-CM | POA: Diagnosis not present

## 2020-01-29 DIAGNOSIS — I69351 Hemiplegia and hemiparesis following cerebral infarction affecting right dominant side: Secondary | ICD-10-CM | POA: Diagnosis not present

## 2020-01-29 DIAGNOSIS — E1165 Type 2 diabetes mellitus with hyperglycemia: Secondary | ICD-10-CM | POA: Diagnosis not present

## 2020-01-29 DIAGNOSIS — E785 Hyperlipidemia, unspecified: Secondary | ICD-10-CM | POA: Diagnosis not present

## 2020-01-29 DIAGNOSIS — G4733 Obstructive sleep apnea (adult) (pediatric): Secondary | ICD-10-CM | POA: Diagnosis not present

## 2020-01-29 DIAGNOSIS — I119 Hypertensive heart disease without heart failure: Secondary | ICD-10-CM | POA: Diagnosis not present

## 2020-01-29 DIAGNOSIS — K219 Gastro-esophageal reflux disease without esophagitis: Secondary | ICD-10-CM | POA: Diagnosis not present

## 2020-01-29 DIAGNOSIS — I6523 Occlusion and stenosis of bilateral carotid arteries: Secondary | ICD-10-CM | POA: Diagnosis not present

## 2020-01-29 DIAGNOSIS — Z9089 Acquired absence of other organs: Secondary | ICD-10-CM | POA: Diagnosis not present

## 2020-01-29 DIAGNOSIS — G894 Chronic pain syndrome: Secondary | ICD-10-CM | POA: Diagnosis not present

## 2020-01-29 DIAGNOSIS — G7000 Myasthenia gravis without (acute) exacerbation: Secondary | ICD-10-CM | POA: Diagnosis not present

## 2020-01-29 DIAGNOSIS — Z85850 Personal history of malignant neoplasm of thyroid: Secondary | ICD-10-CM | POA: Diagnosis not present

## 2020-01-29 DIAGNOSIS — M542 Cervicalgia: Secondary | ICD-10-CM | POA: Diagnosis not present

## 2020-01-30 DIAGNOSIS — Z9089 Acquired absence of other organs: Secondary | ICD-10-CM | POA: Diagnosis not present

## 2020-01-30 DIAGNOSIS — E1165 Type 2 diabetes mellitus with hyperglycemia: Secondary | ICD-10-CM | POA: Diagnosis not present

## 2020-01-30 DIAGNOSIS — J45909 Unspecified asthma, uncomplicated: Secondary | ICD-10-CM | POA: Diagnosis not present

## 2020-01-30 DIAGNOSIS — M542 Cervicalgia: Secondary | ICD-10-CM | POA: Diagnosis not present

## 2020-01-30 DIAGNOSIS — I69351 Hemiplegia and hemiparesis following cerebral infarction affecting right dominant side: Secondary | ICD-10-CM | POA: Diagnosis not present

## 2020-01-30 DIAGNOSIS — Z7982 Long term (current) use of aspirin: Secondary | ICD-10-CM | POA: Diagnosis not present

## 2020-01-30 DIAGNOSIS — I6523 Occlusion and stenosis of bilateral carotid arteries: Secondary | ICD-10-CM | POA: Diagnosis not present

## 2020-01-30 DIAGNOSIS — Z794 Long term (current) use of insulin: Secondary | ICD-10-CM | POA: Diagnosis not present

## 2020-01-30 DIAGNOSIS — E785 Hyperlipidemia, unspecified: Secondary | ICD-10-CM | POA: Diagnosis not present

## 2020-01-30 DIAGNOSIS — G894 Chronic pain syndrome: Secondary | ICD-10-CM | POA: Diagnosis not present

## 2020-01-30 DIAGNOSIS — G4733 Obstructive sleep apnea (adult) (pediatric): Secondary | ICD-10-CM | POA: Diagnosis not present

## 2020-01-30 DIAGNOSIS — M150 Primary generalized (osteo)arthritis: Secondary | ICD-10-CM | POA: Diagnosis not present

## 2020-01-30 DIAGNOSIS — Z85850 Personal history of malignant neoplasm of thyroid: Secondary | ICD-10-CM | POA: Diagnosis not present

## 2020-01-30 DIAGNOSIS — G7000 Myasthenia gravis without (acute) exacerbation: Secondary | ICD-10-CM | POA: Diagnosis not present

## 2020-01-30 DIAGNOSIS — E890 Postprocedural hypothyroidism: Secondary | ICD-10-CM | POA: Diagnosis not present

## 2020-01-30 DIAGNOSIS — I119 Hypertensive heart disease without heart failure: Secondary | ICD-10-CM | POA: Diagnosis not present

## 2020-01-30 DIAGNOSIS — K219 Gastro-esophageal reflux disease without esophagitis: Secondary | ICD-10-CM | POA: Diagnosis not present

## 2020-02-02 DIAGNOSIS — Z79899 Other long term (current) drug therapy: Secondary | ICD-10-CM | POA: Diagnosis not present

## 2020-02-02 DIAGNOSIS — Z85850 Personal history of malignant neoplasm of thyroid: Secondary | ICD-10-CM | POA: Diagnosis not present

## 2020-02-02 DIAGNOSIS — H3500 Unspecified background retinopathy: Secondary | ICD-10-CM | POA: Diagnosis not present

## 2020-02-02 DIAGNOSIS — E113293 Type 2 diabetes mellitus with mild nonproliferative diabetic retinopathy without macular edema, bilateral: Secondary | ICD-10-CM | POA: Diagnosis not present

## 2020-02-02 DIAGNOSIS — H469 Unspecified optic neuritis: Secondary | ICD-10-CM | POA: Diagnosis not present

## 2020-02-05 DIAGNOSIS — I10 Essential (primary) hypertension: Secondary | ICD-10-CM | POA: Diagnosis not present

## 2020-02-05 DIAGNOSIS — R609 Edema, unspecified: Secondary | ICD-10-CM | POA: Diagnosis not present

## 2020-02-05 DIAGNOSIS — E1165 Type 2 diabetes mellitus with hyperglycemia: Secondary | ICD-10-CM | POA: Diagnosis not present

## 2020-02-06 DIAGNOSIS — I69351 Hemiplegia and hemiparesis following cerebral infarction affecting right dominant side: Secondary | ICD-10-CM | POA: Diagnosis not present

## 2020-02-06 DIAGNOSIS — M542 Cervicalgia: Secondary | ICD-10-CM | POA: Diagnosis not present

## 2020-02-06 DIAGNOSIS — Z7982 Long term (current) use of aspirin: Secondary | ICD-10-CM | POA: Diagnosis not present

## 2020-02-06 DIAGNOSIS — Z85850 Personal history of malignant neoplasm of thyroid: Secondary | ICD-10-CM | POA: Diagnosis not present

## 2020-02-06 DIAGNOSIS — M150 Primary generalized (osteo)arthritis: Secondary | ICD-10-CM | POA: Diagnosis not present

## 2020-02-06 DIAGNOSIS — Z794 Long term (current) use of insulin: Secondary | ICD-10-CM | POA: Diagnosis not present

## 2020-02-06 DIAGNOSIS — Z9089 Acquired absence of other organs: Secondary | ICD-10-CM | POA: Diagnosis not present

## 2020-02-06 DIAGNOSIS — G7000 Myasthenia gravis without (acute) exacerbation: Secondary | ICD-10-CM | POA: Diagnosis not present

## 2020-02-06 DIAGNOSIS — I6523 Occlusion and stenosis of bilateral carotid arteries: Secondary | ICD-10-CM | POA: Diagnosis not present

## 2020-02-06 DIAGNOSIS — J45909 Unspecified asthma, uncomplicated: Secondary | ICD-10-CM | POA: Diagnosis not present

## 2020-02-06 DIAGNOSIS — G4733 Obstructive sleep apnea (adult) (pediatric): Secondary | ICD-10-CM | POA: Diagnosis not present

## 2020-02-06 DIAGNOSIS — E890 Postprocedural hypothyroidism: Secondary | ICD-10-CM | POA: Diagnosis not present

## 2020-02-06 DIAGNOSIS — K219 Gastro-esophageal reflux disease without esophagitis: Secondary | ICD-10-CM | POA: Diagnosis not present

## 2020-02-06 DIAGNOSIS — E1165 Type 2 diabetes mellitus with hyperglycemia: Secondary | ICD-10-CM | POA: Diagnosis not present

## 2020-02-06 DIAGNOSIS — G894 Chronic pain syndrome: Secondary | ICD-10-CM | POA: Diagnosis not present

## 2020-02-06 DIAGNOSIS — E785 Hyperlipidemia, unspecified: Secondary | ICD-10-CM | POA: Diagnosis not present

## 2020-02-06 DIAGNOSIS — I119 Hypertensive heart disease without heart failure: Secondary | ICD-10-CM | POA: Diagnosis not present

## 2020-02-07 DIAGNOSIS — M542 Cervicalgia: Secondary | ICD-10-CM | POA: Diagnosis not present

## 2020-02-07 DIAGNOSIS — J45909 Unspecified asthma, uncomplicated: Secondary | ICD-10-CM | POA: Diagnosis not present

## 2020-02-07 DIAGNOSIS — G7000 Myasthenia gravis without (acute) exacerbation: Secondary | ICD-10-CM | POA: Diagnosis not present

## 2020-02-07 DIAGNOSIS — I69351 Hemiplegia and hemiparesis following cerebral infarction affecting right dominant side: Secondary | ICD-10-CM | POA: Diagnosis not present

## 2020-02-07 DIAGNOSIS — I119 Hypertensive heart disease without heart failure: Secondary | ICD-10-CM | POA: Diagnosis not present

## 2020-02-07 DIAGNOSIS — K219 Gastro-esophageal reflux disease without esophagitis: Secondary | ICD-10-CM | POA: Diagnosis not present

## 2020-02-07 DIAGNOSIS — E890 Postprocedural hypothyroidism: Secondary | ICD-10-CM | POA: Diagnosis not present

## 2020-02-07 DIAGNOSIS — G4733 Obstructive sleep apnea (adult) (pediatric): Secondary | ICD-10-CM | POA: Diagnosis not present

## 2020-02-07 DIAGNOSIS — E785 Hyperlipidemia, unspecified: Secondary | ICD-10-CM | POA: Diagnosis not present

## 2020-02-07 DIAGNOSIS — Z9089 Acquired absence of other organs: Secondary | ICD-10-CM | POA: Diagnosis not present

## 2020-02-07 DIAGNOSIS — G894 Chronic pain syndrome: Secondary | ICD-10-CM | POA: Diagnosis not present

## 2020-02-07 DIAGNOSIS — Z794 Long term (current) use of insulin: Secondary | ICD-10-CM | POA: Diagnosis not present

## 2020-02-07 DIAGNOSIS — I6523 Occlusion and stenosis of bilateral carotid arteries: Secondary | ICD-10-CM | POA: Diagnosis not present

## 2020-02-07 DIAGNOSIS — E1165 Type 2 diabetes mellitus with hyperglycemia: Secondary | ICD-10-CM | POA: Diagnosis not present

## 2020-02-07 DIAGNOSIS — M150 Primary generalized (osteo)arthritis: Secondary | ICD-10-CM | POA: Diagnosis not present

## 2020-02-07 DIAGNOSIS — Z85850 Personal history of malignant neoplasm of thyroid: Secondary | ICD-10-CM | POA: Diagnosis not present

## 2020-02-07 DIAGNOSIS — Z7982 Long term (current) use of aspirin: Secondary | ICD-10-CM | POA: Diagnosis not present

## 2020-02-12 DIAGNOSIS — E785 Hyperlipidemia, unspecified: Secondary | ICD-10-CM | POA: Diagnosis not present

## 2020-02-12 DIAGNOSIS — Z794 Long term (current) use of insulin: Secondary | ICD-10-CM | POA: Diagnosis not present

## 2020-02-12 DIAGNOSIS — G7000 Myasthenia gravis without (acute) exacerbation: Secondary | ICD-10-CM | POA: Diagnosis not present

## 2020-02-12 DIAGNOSIS — E890 Postprocedural hypothyroidism: Secondary | ICD-10-CM | POA: Diagnosis not present

## 2020-02-12 DIAGNOSIS — M150 Primary generalized (osteo)arthritis: Secondary | ICD-10-CM | POA: Diagnosis not present

## 2020-02-12 DIAGNOSIS — Z85850 Personal history of malignant neoplasm of thyroid: Secondary | ICD-10-CM | POA: Diagnosis not present

## 2020-02-12 DIAGNOSIS — I6523 Occlusion and stenosis of bilateral carotid arteries: Secondary | ICD-10-CM | POA: Diagnosis not present

## 2020-02-12 DIAGNOSIS — Z9089 Acquired absence of other organs: Secondary | ICD-10-CM | POA: Diagnosis not present

## 2020-02-12 DIAGNOSIS — M542 Cervicalgia: Secondary | ICD-10-CM | POA: Diagnosis not present

## 2020-02-12 DIAGNOSIS — G894 Chronic pain syndrome: Secondary | ICD-10-CM | POA: Diagnosis not present

## 2020-02-12 DIAGNOSIS — K219 Gastro-esophageal reflux disease without esophagitis: Secondary | ICD-10-CM | POA: Diagnosis not present

## 2020-02-12 DIAGNOSIS — E1165 Type 2 diabetes mellitus with hyperglycemia: Secondary | ICD-10-CM | POA: Diagnosis not present

## 2020-02-12 DIAGNOSIS — G4733 Obstructive sleep apnea (adult) (pediatric): Secondary | ICD-10-CM | POA: Diagnosis not present

## 2020-02-12 DIAGNOSIS — J45909 Unspecified asthma, uncomplicated: Secondary | ICD-10-CM | POA: Diagnosis not present

## 2020-02-12 DIAGNOSIS — Z7982 Long term (current) use of aspirin: Secondary | ICD-10-CM | POA: Diagnosis not present

## 2020-02-12 DIAGNOSIS — I119 Hypertensive heart disease without heart failure: Secondary | ICD-10-CM | POA: Diagnosis not present

## 2020-02-12 DIAGNOSIS — I69351 Hemiplegia and hemiparesis following cerebral infarction affecting right dominant side: Secondary | ICD-10-CM | POA: Diagnosis not present

## 2020-02-13 DIAGNOSIS — Z9089 Acquired absence of other organs: Secondary | ICD-10-CM | POA: Diagnosis not present

## 2020-02-13 DIAGNOSIS — I69351 Hemiplegia and hemiparesis following cerebral infarction affecting right dominant side: Secondary | ICD-10-CM | POA: Diagnosis not present

## 2020-02-13 DIAGNOSIS — G4733 Obstructive sleep apnea (adult) (pediatric): Secondary | ICD-10-CM | POA: Diagnosis not present

## 2020-02-13 DIAGNOSIS — M150 Primary generalized (osteo)arthritis: Secondary | ICD-10-CM | POA: Diagnosis not present

## 2020-02-13 DIAGNOSIS — Z794 Long term (current) use of insulin: Secondary | ICD-10-CM | POA: Diagnosis not present

## 2020-02-13 DIAGNOSIS — M542 Cervicalgia: Secondary | ICD-10-CM | POA: Diagnosis not present

## 2020-02-13 DIAGNOSIS — E890 Postprocedural hypothyroidism: Secondary | ICD-10-CM | POA: Diagnosis not present

## 2020-02-13 DIAGNOSIS — I119 Hypertensive heart disease without heart failure: Secondary | ICD-10-CM | POA: Diagnosis not present

## 2020-02-13 DIAGNOSIS — E1165 Type 2 diabetes mellitus with hyperglycemia: Secondary | ICD-10-CM | POA: Diagnosis not present

## 2020-02-13 DIAGNOSIS — I6523 Occlusion and stenosis of bilateral carotid arteries: Secondary | ICD-10-CM | POA: Diagnosis not present

## 2020-02-13 DIAGNOSIS — G7000 Myasthenia gravis without (acute) exacerbation: Secondary | ICD-10-CM | POA: Diagnosis not present

## 2020-02-13 DIAGNOSIS — E785 Hyperlipidemia, unspecified: Secondary | ICD-10-CM | POA: Diagnosis not present

## 2020-02-13 DIAGNOSIS — Z85850 Personal history of malignant neoplasm of thyroid: Secondary | ICD-10-CM | POA: Diagnosis not present

## 2020-02-13 DIAGNOSIS — Z7982 Long term (current) use of aspirin: Secondary | ICD-10-CM | POA: Diagnosis not present

## 2020-02-13 DIAGNOSIS — J45909 Unspecified asthma, uncomplicated: Secondary | ICD-10-CM | POA: Diagnosis not present

## 2020-02-13 DIAGNOSIS — G894 Chronic pain syndrome: Secondary | ICD-10-CM | POA: Diagnosis not present

## 2020-02-13 DIAGNOSIS — K219 Gastro-esophageal reflux disease without esophagitis: Secondary | ICD-10-CM | POA: Diagnosis not present

## 2020-02-19 DIAGNOSIS — G4733 Obstructive sleep apnea (adult) (pediatric): Secondary | ICD-10-CM | POA: Diagnosis not present

## 2020-02-19 DIAGNOSIS — G7000 Myasthenia gravis without (acute) exacerbation: Secondary | ICD-10-CM | POA: Diagnosis not present

## 2020-02-19 DIAGNOSIS — Z794 Long term (current) use of insulin: Secondary | ICD-10-CM | POA: Diagnosis not present

## 2020-02-19 DIAGNOSIS — I6523 Occlusion and stenosis of bilateral carotid arteries: Secondary | ICD-10-CM | POA: Diagnosis not present

## 2020-02-19 DIAGNOSIS — M542 Cervicalgia: Secondary | ICD-10-CM | POA: Diagnosis not present

## 2020-02-19 DIAGNOSIS — E890 Postprocedural hypothyroidism: Secondary | ICD-10-CM | POA: Diagnosis not present

## 2020-02-19 DIAGNOSIS — K219 Gastro-esophageal reflux disease without esophagitis: Secondary | ICD-10-CM | POA: Diagnosis not present

## 2020-02-19 DIAGNOSIS — I69351 Hemiplegia and hemiparesis following cerebral infarction affecting right dominant side: Secondary | ICD-10-CM | POA: Diagnosis not present

## 2020-02-19 DIAGNOSIS — J45909 Unspecified asthma, uncomplicated: Secondary | ICD-10-CM | POA: Diagnosis not present

## 2020-02-19 DIAGNOSIS — I119 Hypertensive heart disease without heart failure: Secondary | ICD-10-CM | POA: Diagnosis not present

## 2020-02-19 DIAGNOSIS — E1165 Type 2 diabetes mellitus with hyperglycemia: Secondary | ICD-10-CM | POA: Diagnosis not present

## 2020-02-19 DIAGNOSIS — G894 Chronic pain syndrome: Secondary | ICD-10-CM | POA: Diagnosis not present

## 2020-02-19 DIAGNOSIS — M150 Primary generalized (osteo)arthritis: Secondary | ICD-10-CM | POA: Diagnosis not present

## 2020-02-19 DIAGNOSIS — Z85850 Personal history of malignant neoplasm of thyroid: Secondary | ICD-10-CM | POA: Diagnosis not present

## 2020-02-19 DIAGNOSIS — E785 Hyperlipidemia, unspecified: Secondary | ICD-10-CM | POA: Diagnosis not present

## 2020-02-19 DIAGNOSIS — Z7982 Long term (current) use of aspirin: Secondary | ICD-10-CM | POA: Diagnosis not present

## 2020-02-19 DIAGNOSIS — Z9089 Acquired absence of other organs: Secondary | ICD-10-CM | POA: Diagnosis not present

## 2020-02-20 DIAGNOSIS — Z85850 Personal history of malignant neoplasm of thyroid: Secondary | ICD-10-CM | POA: Diagnosis not present

## 2020-02-20 DIAGNOSIS — M542 Cervicalgia: Secondary | ICD-10-CM | POA: Diagnosis not present

## 2020-02-20 DIAGNOSIS — I69351 Hemiplegia and hemiparesis following cerebral infarction affecting right dominant side: Secondary | ICD-10-CM | POA: Diagnosis not present

## 2020-02-20 DIAGNOSIS — Z794 Long term (current) use of insulin: Secondary | ICD-10-CM | POA: Diagnosis not present

## 2020-02-20 DIAGNOSIS — Z9089 Acquired absence of other organs: Secondary | ICD-10-CM | POA: Diagnosis not present

## 2020-02-20 DIAGNOSIS — K219 Gastro-esophageal reflux disease without esophagitis: Secondary | ICD-10-CM | POA: Diagnosis not present

## 2020-02-20 DIAGNOSIS — J45909 Unspecified asthma, uncomplicated: Secondary | ICD-10-CM | POA: Diagnosis not present

## 2020-02-20 DIAGNOSIS — Z7982 Long term (current) use of aspirin: Secondary | ICD-10-CM | POA: Diagnosis not present

## 2020-02-20 DIAGNOSIS — G7000 Myasthenia gravis without (acute) exacerbation: Secondary | ICD-10-CM | POA: Diagnosis not present

## 2020-02-20 DIAGNOSIS — E890 Postprocedural hypothyroidism: Secondary | ICD-10-CM | POA: Diagnosis not present

## 2020-02-20 DIAGNOSIS — G894 Chronic pain syndrome: Secondary | ICD-10-CM | POA: Diagnosis not present

## 2020-02-20 DIAGNOSIS — G4733 Obstructive sleep apnea (adult) (pediatric): Secondary | ICD-10-CM | POA: Diagnosis not present

## 2020-02-20 DIAGNOSIS — I6523 Occlusion and stenosis of bilateral carotid arteries: Secondary | ICD-10-CM | POA: Diagnosis not present

## 2020-02-20 DIAGNOSIS — I119 Hypertensive heart disease without heart failure: Secondary | ICD-10-CM | POA: Diagnosis not present

## 2020-02-20 DIAGNOSIS — E785 Hyperlipidemia, unspecified: Secondary | ICD-10-CM | POA: Diagnosis not present

## 2020-02-20 DIAGNOSIS — M150 Primary generalized (osteo)arthritis: Secondary | ICD-10-CM | POA: Diagnosis not present

## 2020-02-20 DIAGNOSIS — E1165 Type 2 diabetes mellitus with hyperglycemia: Secondary | ICD-10-CM | POA: Diagnosis not present

## 2020-02-21 DIAGNOSIS — Z794 Long term (current) use of insulin: Secondary | ICD-10-CM | POA: Diagnosis not present

## 2020-02-21 DIAGNOSIS — E119 Type 2 diabetes mellitus without complications: Secondary | ICD-10-CM | POA: Diagnosis not present

## 2020-02-22 ENCOUNTER — Encounter: Payer: Self-pay | Admitting: Physical Medicine & Rehabilitation

## 2020-02-22 ENCOUNTER — Encounter: Payer: Medicare Other | Attending: Registered Nurse | Admitting: Physical Medicine & Rehabilitation

## 2020-02-22 ENCOUNTER — Other Ambulatory Visit: Payer: Self-pay

## 2020-02-22 VITALS — BP 139/80 | HR 96 | Temp 98.4°F | Ht 63.0 in | Wt 198.2 lb

## 2020-02-22 DIAGNOSIS — G7000 Myasthenia gravis without (acute) exacerbation: Secondary | ICD-10-CM | POA: Diagnosis not present

## 2020-02-22 DIAGNOSIS — I639 Cerebral infarction, unspecified: Secondary | ICD-10-CM | POA: Diagnosis not present

## 2020-02-22 DIAGNOSIS — S93431A Sprain of tibiofibular ligament of right ankle, initial encounter: Secondary | ICD-10-CM | POA: Diagnosis not present

## 2020-02-22 DIAGNOSIS — I10 Essential (primary) hypertension: Secondary | ICD-10-CM | POA: Insufficient documentation

## 2020-02-22 DIAGNOSIS — I6381 Other cerebral infarction due to occlusion or stenosis of small artery: Secondary | ICD-10-CM

## 2020-02-22 NOTE — Progress Notes (Signed)
Subjective:    Patient ID: [name], female    DOB: [birth year], 69 y.o.   MRN: [id number] 69 y.o. right-handed female with history of diabetes mellitus, ACDF 2014, chronic pain syndrome, myasthenia gravis maintained on CellCept as well as chronic prednisone receives IVIG at [facility], asthma with OSA, thyroid cancer, hypertension, hyperlipidemia.  She lives alone independent prior to admission.  She does have family in the area that work.  Presented 12/20/2019 with right side weakness.  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  Nuclear medicine perfusion scan no embolus.  Patient with recent carotid Dopplers noted right ICA greater than 70% left ICA 50 to 69%.  Echocardiogram with ejection fraction of 00% grade 1 diastolic dysfunction.  Admission chemistries potassium 3.3 troponin negative BNP 215, maintained on aspirin and Plavix X 3 WK then ASA alone for CVA prophylaxis.  Subcutaneous Lovenox for DVT prophylaxis.  Vascular surgery follow-up in regards to carotid stenosis no plan for surgical intervention follow-up outpatient  Admit date: 12/27/2019 Discharge date: 01/09/2020  HPI Tripped TUes 6/29 with Right ankle pain, pain has not improved yet Had swelling and used ice to help Numbness in Right 4th and 5th digits HHOT- will be back in 3 wks HHPT- will be back in 2wks  Mod I dressing /bathing , able to do Laundry and cook as well  MG stable, IVIG every 6 wks  Pain Inventory Average Pain 4 Pain Right Now 4 My pain is intermittent, constant, dull and head is constant  In the last 24 hours, has pain interfered with the following? General activity 0 Relation with others 0 Enjoyment of life 0 What TIME of day is your pain at its worst? all Sleep (in general) Good  Pain is worse with: unsure Pain improves with: medication Relief from Meds: 5  Mobility walk with assistance use a walker  Function disabled: date disabled  .  Neuro/Psych weakness tremor  Prior Studies Any changes since last visit?  no  Physicians involved in your care Any changes since last visit?  no   Family History  Problem Relation Age of Onset  . Coronary artery disease Sister        s/p coronary stenting  . Stroke Sister 28  . Diabetes Sister   . Congestive Heart Failure Mother   . Asthma Mother   . Diabetes Mother   . Congestive Heart Failure Father   . Lung cancer Father   . Asthma Father   . Diabetes Father   . Diabetes Brother    Social History   Socioeconomic History  . Marital status: Single    Spouse name: Not on file  . Number of children: 0  . Years of education: college  . Highest education level: Not on file  Occupational History  . Occupation: retired  Tobacco Use  . Smoking status: Never Smoker  . Smokeless tobacco: Never Used  Vaping Use  . Vaping Use: Never used  Substance and Sexual Activity  . Alcohol use: No    Alcohol/week: 0.0 standard drinks  . Drug use: No  . Sexual activity: Never  Other Topics Concern  . Not on file  Social History Narrative   Caffeine none.   Social Determinants of Health   Financial Resource Strain:   . Difficulty of Paying Living Expenses:   Food Insecurity:   . Worried About Charity fundraiser in the Last Year:   . [place] in the Last  Year:   Transportation Needs:   . Film/video editor (Medical):   Marland Kitchen Lack of Transportation (Non-Medical):   Physical Activity:   . Days of Exercise per Week:   . Minutes of Exercise per Session:   Stress:   . Feeling of Stress :   Social Connections:   . Frequency of Communication with Friends and Family:   . Frequency of Social Gatherings with Friends and Family:   . Attends Religious Services:   . Active Member of Clubs or Organizations:   . Attends Archivist Meetings:   Marland Kitchen Marital Status:    Past Surgical History:  Procedure Laterality Date  . ABDOMINAL HYSTERECTOMY    . ANTERIOR  CERVICAL DECOMP/DISCECTOMY FUSION     "I've had severa ORs; always went in from the front" (03/13/2013)  . APPENDECTOMY    . CARDIAC CATHETERIZATION     "several" (03/13/2013)  . CARPAL TUNNEL RELEASE Right   . CATARACT EXTRACTION W/ INTRAOCULAR LENS  IMPLANT, BILATERAL Bilateral   . CHOLECYSTECTOMY    . KNEE ARTHROSCOPY Left   . SHOULDER ARTHROSCOPY W/ ROTATOR CUFF REPAIR Left   . TONSILLECTOMY    . TOTAL THYROIDECTOMY     Past Medical History:  Diagnosis Date  . Arthritis    "all over my body" (03/13/2013)  . Asthma   . Asymptomatic carotid artery stenosis, bilateral 10/08/2018  . GERD (gastroesophageal reflux disease)   . H/O hiatal hernia   . Heart murmur   . Hypercholesterolemia 10/08/2018  . Hypertension   . Hypothyroidism   . Myasthenia gravis ([place])    "in my eyes; diagnsosed > 7 yr ago" (03/13/2013)  . Sleep apnea    on CPAP  . Thyroid carcinoma ([place])   . Type II diabetes mellitus (HCC)    BP 139/80   Pulse 96   Temp 98.4 F (36.9 C)   Ht 5\' 3"  (1.6 m)   Wt 198 lb 3.2 oz (89.9 kg)   SpO2 95%   BMI 35.11 kg/m   Opioid Risk Score:   Fall Risk Score:  `1  Depression screen PHQ 2/9  Depression screen [facility] 2/9 02/22/2020 01/17/2020  Decreased Interest 0 1  Down, Depressed, Hopeless 0 0  PHQ - 2 Score 0 1  Altered sleeping - 0  Tired, decreased energy - 1  Change in appetite - 0  Feeling bad or failure about yourself  - 0  Trouble concentrating - 0  Moving slowly or fidgety/restless - 0  Suicidal thoughts - 0  PHQ-9 Score - 2  Difficult doing work/chores - Not difficult at all    Review of Systems  Constitutional: Negative.   HENT: Negative.   Eyes: Negative.   Respiratory: Negative.   Cardiovascular: Negative.   Gastrointestinal: Negative.   Endocrine: Negative.   Genitourinary: Negative.   Musculoskeletal: Negative.   Skin: Negative.   Allergic/Immunologic: Negative.   Neurological: Positive for tremors and weakness.  Hematological: Negative.    Psychiatric/Behavioral: Negative.   All other systems reviewed and are negative.      Objective:   Physical Exam Vitals and nursing note reviewed.  Constitutional:      Appearance: She is obese.  Eyes:     Extraocular Movements: Extraocular movements intact.     Conjunctiva/sclera: Conjunctivae normal.     Pupils: Pupils are equal, round, and reactive to light.  Neurological:     Mental Status: She is alert and oriented to person, place, and time.  Cranial Nerves: No dysarthria.     Sensory: Sensory deficit present.     Motor: No abnormal muscle tone or seizure activity.     Coordination: Finger-Nose-Finger Test abnormal. Impaired rapid alternating movements.     Gait: Gait abnormal.     Comments: Motor strength is 5/5 in the left deltoid, bicep, tricep, grip, hip flexion, knee extension, ankle dorsiflexion 4/5 in the right deltoid, bicep, tricep, grip, hip flexor, knee extensor, ankle dorsiflexor Sensation is reduced in the right fourth and fifth digits.  No reduction in the lower extremity. Right ankle has tenderness on the anterior talofibular ligament pain with inversion.  No evidence of joint effusion no erythema no ecchymosis.  Psychiatric:        Mood and Affect: Mood normal.        Behavior: Behavior normal.   Fine motor reduced right hand finger thumb opposition mild impairment No evidence of dysmetria with finger-nose-finger testing or heel-to-shin testing Ambulates with walker no evidence of toe drag or knee instability is slow gait       Assessment & Plan:  #1.  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 She asked about driving.  At this point she has an ankle sprain which is limiting her also double vision  from myasthenia  2.  Myasthenia gravis with diplopia follow-up with neurology, IVIG treatment plan.  Needs okay to drive from neurology  3.  Right ankle sprain anterior talofibular ice over-the-counter ankle splint elevate If no better in 1 week with check x-ray  Return to clinic 1 month

## 2020-02-22 NOTE — Patient Instructions (Signed)
Ankle Sprain  An ankle sprain is a stretch or tear in one of the tough tissues (ligaments) that connect the bones in your ankle. An ankle sprain can happen when the ankle rolls outward (inversion sprain) or inward (eversion sprain). What are the causes? This condition is caused by rolling or twisting the ankle. What increases the risk? You are more likely to develop this condition if you play sports. What are the signs or symptoms? Symptoms of this condition include:  Pain in your ankle.  Swelling.  Bruising. This may happen right after you sprain your ankle or 1-2 days later.  Trouble standing or walking. How is this diagnosed? This condition is diagnosed with:  A physical exam. During the exam, your doctor will press on certain parts of your foot and ankle and try to move them in certain ways.  X-ray imaging. These may be taken to see how bad the sprain is and to check for broken bones. How is this treated? This condition may be treated with:  A brace or splint. This is used to keep the ankle from moving until it heals.  An elastic bandage. This is used to support the ankle.  Crutches.  Pain medicine.  Surgery. This may be needed if the sprain is very bad.  Physical therapy. This may help to improve movement in the ankle. Follow these instructions at home: If you have a brace or a splint:  Wear the brace or splint as told by your doctor. Remove it only as told by your doctor.  Loosen the brace or splint if your toes: ? Tingle. ? Lose feeling (become numb). ? Turn cold and blue.  Keep the brace or splint clean.  If the brace or splint is not waterproof: ? Do not let it get wet. ? Cover it with a watertight covering when you take a bath or a shower. If you have an elastic bandage (dressing):  Remove it to shower or bathe.  Try not to move your ankle much, but wiggle your toes from time to time. This helps to prevent swelling.  Adjust the dressing if it feels  too tight.  Loosen the dressing if your foot: ? Loses feeling. ? Tingles. ? Becomes cold and blue. Managing pain, stiffness, and swelling   Take over-the-counter and prescription medicines only as told by doctor.  For 2-3 days, keep your ankle raised (elevated) above the level of your heart.  If told, put ice on the injured area: ? If you have a removable brace or splint, remove it as told by your doctor. ? Put ice in a plastic bag. ? Place a towel between your skin and the bag. ? Leave the ice on for 20 minutes, 2-3 times a day. General instructions  Rest your ankle.  Do not use your injured leg to support your body weight until your doctor says that you can. Use crutches as told by your doctor.  Do not use any products that contain nicotine or tobacco, such as cigarettes, e-cigarettes, and chewing tobacco. If you need help quitting, ask your doctor.  Keep all follow-up visits as told by your doctor. Contact a doctor if:  Your bruises or swelling are quickly getting worse.  Your pain does not get better after you take medicine. Get help right away if:  You cannot feel your toes or foot.  Your foot or toes look blue.  You have very bad pain that gets worse. Summary  An ankle sprain is a stretch   or tear in one of the tough tissues (ligaments) that connect the bones in your ankle.  This condition is caused by rolling or twisting the ankle.  Symptoms include pain, swelling, bruising, and trouble walking.  To help with pain and swelling, put ice on the injured ankle, raise your ankle above the level of your heart, and use an elastic bandage. Also, rest as told by your doctor.  Keep all follow-up visits as told by your doctor. This is important. This information is not intended to replace advice given to you by your health care provider. Make sure you discuss any questions you have with your health care provider. Document Revised: 01/04/2018 Document Reviewed:  01/04/2018 Elsevier Patient Education  2020 Elsevier Inc.  

## 2020-02-28 DIAGNOSIS — E1165 Type 2 diabetes mellitus with hyperglycemia: Secondary | ICD-10-CM | POA: Diagnosis not present

## 2020-02-28 DIAGNOSIS — E785 Hyperlipidemia, unspecified: Secondary | ICD-10-CM | POA: Diagnosis not present

## 2020-02-28 DIAGNOSIS — Z9089 Acquired absence of other organs: Secondary | ICD-10-CM | POA: Diagnosis not present

## 2020-02-28 DIAGNOSIS — Z85850 Personal history of malignant neoplasm of thyroid: Secondary | ICD-10-CM | POA: Diagnosis not present

## 2020-02-28 DIAGNOSIS — I69351 Hemiplegia and hemiparesis following cerebral infarction affecting right dominant side: Secondary | ICD-10-CM | POA: Diagnosis not present

## 2020-02-28 DIAGNOSIS — I6523 Occlusion and stenosis of bilateral carotid arteries: Secondary | ICD-10-CM | POA: Diagnosis not present

## 2020-02-28 DIAGNOSIS — J45909 Unspecified asthma, uncomplicated: Secondary | ICD-10-CM | POA: Diagnosis not present

## 2020-02-28 DIAGNOSIS — G7000 Myasthenia gravis without (acute) exacerbation: Secondary | ICD-10-CM | POA: Diagnosis not present

## 2020-02-28 DIAGNOSIS — M542 Cervicalgia: Secondary | ICD-10-CM | POA: Diagnosis not present

## 2020-02-28 DIAGNOSIS — G894 Chronic pain syndrome: Secondary | ICD-10-CM | POA: Diagnosis not present

## 2020-02-28 DIAGNOSIS — Z7982 Long term (current) use of aspirin: Secondary | ICD-10-CM | POA: Diagnosis not present

## 2020-02-28 DIAGNOSIS — K219 Gastro-esophageal reflux disease without esophagitis: Secondary | ICD-10-CM | POA: Diagnosis not present

## 2020-02-28 DIAGNOSIS — G4733 Obstructive sleep apnea (adult) (pediatric): Secondary | ICD-10-CM | POA: Diagnosis not present

## 2020-02-28 DIAGNOSIS — I119 Hypertensive heart disease without heart failure: Secondary | ICD-10-CM | POA: Diagnosis not present

## 2020-02-28 DIAGNOSIS — E890 Postprocedural hypothyroidism: Secondary | ICD-10-CM | POA: Diagnosis not present

## 2020-02-28 DIAGNOSIS — M150 Primary generalized (osteo)arthritis: Secondary | ICD-10-CM | POA: Diagnosis not present

## 2020-02-28 DIAGNOSIS — Z794 Long term (current) use of insulin: Secondary | ICD-10-CM | POA: Diagnosis not present

## 2020-03-01 DIAGNOSIS — E785 Hyperlipidemia, unspecified: Secondary | ICD-10-CM | POA: Diagnosis not present

## 2020-03-01 DIAGNOSIS — J45909 Unspecified asthma, uncomplicated: Secondary | ICD-10-CM | POA: Diagnosis not present

## 2020-03-01 DIAGNOSIS — M542 Cervicalgia: Secondary | ICD-10-CM | POA: Diagnosis not present

## 2020-03-01 DIAGNOSIS — G4733 Obstructive sleep apnea (adult) (pediatric): Secondary | ICD-10-CM | POA: Diagnosis not present

## 2020-03-01 DIAGNOSIS — Z7982 Long term (current) use of aspirin: Secondary | ICD-10-CM | POA: Diagnosis not present

## 2020-03-01 DIAGNOSIS — Z9089 Acquired absence of other organs: Secondary | ICD-10-CM | POA: Diagnosis not present

## 2020-03-01 DIAGNOSIS — G894 Chronic pain syndrome: Secondary | ICD-10-CM | POA: Diagnosis not present

## 2020-03-01 DIAGNOSIS — Z85850 Personal history of malignant neoplasm of thyroid: Secondary | ICD-10-CM | POA: Diagnosis not present

## 2020-03-01 DIAGNOSIS — G7000 Myasthenia gravis without (acute) exacerbation: Secondary | ICD-10-CM | POA: Diagnosis not present

## 2020-03-01 DIAGNOSIS — E890 Postprocedural hypothyroidism: Secondary | ICD-10-CM | POA: Diagnosis not present

## 2020-03-01 DIAGNOSIS — M150 Primary generalized (osteo)arthritis: Secondary | ICD-10-CM | POA: Diagnosis not present

## 2020-03-01 DIAGNOSIS — K219 Gastro-esophageal reflux disease without esophagitis: Secondary | ICD-10-CM | POA: Diagnosis not present

## 2020-03-01 DIAGNOSIS — I69351 Hemiplegia and hemiparesis following cerebral infarction affecting right dominant side: Secondary | ICD-10-CM | POA: Diagnosis not present

## 2020-03-01 DIAGNOSIS — E1165 Type 2 diabetes mellitus with hyperglycemia: Secondary | ICD-10-CM | POA: Diagnosis not present

## 2020-03-01 DIAGNOSIS — Z794 Long term (current) use of insulin: Secondary | ICD-10-CM | POA: Diagnosis not present

## 2020-03-01 DIAGNOSIS — I6523 Occlusion and stenosis of bilateral carotid arteries: Secondary | ICD-10-CM | POA: Diagnosis not present

## 2020-03-01 DIAGNOSIS — I119 Hypertensive heart disease without heart failure: Secondary | ICD-10-CM | POA: Diagnosis not present

## 2020-03-05 DIAGNOSIS — Z794 Long term (current) use of insulin: Secondary | ICD-10-CM | POA: Diagnosis not present

## 2020-03-05 DIAGNOSIS — E1165 Type 2 diabetes mellitus with hyperglycemia: Secondary | ICD-10-CM | POA: Diagnosis not present

## 2020-03-05 DIAGNOSIS — M150 Primary generalized (osteo)arthritis: Secondary | ICD-10-CM | POA: Diagnosis not present

## 2020-03-05 DIAGNOSIS — Z9089 Acquired absence of other organs: Secondary | ICD-10-CM | POA: Diagnosis not present

## 2020-03-05 DIAGNOSIS — I119 Hypertensive heart disease without heart failure: Secondary | ICD-10-CM | POA: Diagnosis not present

## 2020-03-05 DIAGNOSIS — G894 Chronic pain syndrome: Secondary | ICD-10-CM | POA: Diagnosis not present

## 2020-03-05 DIAGNOSIS — E785 Hyperlipidemia, unspecified: Secondary | ICD-10-CM | POA: Diagnosis not present

## 2020-03-05 DIAGNOSIS — J45909 Unspecified asthma, uncomplicated: Secondary | ICD-10-CM | POA: Diagnosis not present

## 2020-03-05 DIAGNOSIS — E890 Postprocedural hypothyroidism: Secondary | ICD-10-CM | POA: Diagnosis not present

## 2020-03-05 DIAGNOSIS — Z7982 Long term (current) use of aspirin: Secondary | ICD-10-CM | POA: Diagnosis not present

## 2020-03-05 DIAGNOSIS — G4733 Obstructive sleep apnea (adult) (pediatric): Secondary | ICD-10-CM | POA: Diagnosis not present

## 2020-03-05 DIAGNOSIS — Z85850 Personal history of malignant neoplasm of thyroid: Secondary | ICD-10-CM | POA: Diagnosis not present

## 2020-03-05 DIAGNOSIS — I69351 Hemiplegia and hemiparesis following cerebral infarction affecting right dominant side: Secondary | ICD-10-CM | POA: Diagnosis not present

## 2020-03-05 DIAGNOSIS — K219 Gastro-esophageal reflux disease without esophagitis: Secondary | ICD-10-CM | POA: Diagnosis not present

## 2020-03-05 DIAGNOSIS — I6523 Occlusion and stenosis of bilateral carotid arteries: Secondary | ICD-10-CM | POA: Diagnosis not present

## 2020-03-05 DIAGNOSIS — G7000 Myasthenia gravis without (acute) exacerbation: Secondary | ICD-10-CM | POA: Diagnosis not present

## 2020-03-05 DIAGNOSIS — M542 Cervicalgia: Secondary | ICD-10-CM | POA: Diagnosis not present

## 2020-03-06 DIAGNOSIS — E890 Postprocedural hypothyroidism: Secondary | ICD-10-CM | POA: Diagnosis not present

## 2020-03-06 DIAGNOSIS — G894 Chronic pain syndrome: Secondary | ICD-10-CM | POA: Diagnosis not present

## 2020-03-06 DIAGNOSIS — J45909 Unspecified asthma, uncomplicated: Secondary | ICD-10-CM | POA: Diagnosis not present

## 2020-03-06 DIAGNOSIS — E785 Hyperlipidemia, unspecified: Secondary | ICD-10-CM | POA: Diagnosis not present

## 2020-03-06 DIAGNOSIS — G7000 Myasthenia gravis without (acute) exacerbation: Secondary | ICD-10-CM | POA: Diagnosis not present

## 2020-03-06 DIAGNOSIS — M150 Primary generalized (osteo)arthritis: Secondary | ICD-10-CM | POA: Diagnosis not present

## 2020-03-06 DIAGNOSIS — G4733 Obstructive sleep apnea (adult) (pediatric): Secondary | ICD-10-CM | POA: Diagnosis not present

## 2020-03-06 DIAGNOSIS — Z9089 Acquired absence of other organs: Secondary | ICD-10-CM | POA: Diagnosis not present

## 2020-03-06 DIAGNOSIS — I6523 Occlusion and stenosis of bilateral carotid arteries: Secondary | ICD-10-CM | POA: Diagnosis not present

## 2020-03-06 DIAGNOSIS — E1165 Type 2 diabetes mellitus with hyperglycemia: Secondary | ICD-10-CM | POA: Diagnosis not present

## 2020-03-06 DIAGNOSIS — I119 Hypertensive heart disease without heart failure: Secondary | ICD-10-CM | POA: Diagnosis not present

## 2020-03-06 DIAGNOSIS — Z85850 Personal history of malignant neoplasm of thyroid: Secondary | ICD-10-CM | POA: Diagnosis not present

## 2020-03-06 DIAGNOSIS — Z794 Long term (current) use of insulin: Secondary | ICD-10-CM | POA: Diagnosis not present

## 2020-03-06 DIAGNOSIS — Z7982 Long term (current) use of aspirin: Secondary | ICD-10-CM | POA: Diagnosis not present

## 2020-03-06 DIAGNOSIS — M542 Cervicalgia: Secondary | ICD-10-CM | POA: Diagnosis not present

## 2020-03-06 DIAGNOSIS — K219 Gastro-esophageal reflux disease without esophagitis: Secondary | ICD-10-CM | POA: Diagnosis not present

## 2020-03-06 DIAGNOSIS — I69351 Hemiplegia and hemiparesis following cerebral infarction affecting right dominant side: Secondary | ICD-10-CM | POA: Diagnosis not present

## 2020-03-12 DIAGNOSIS — I69351 Hemiplegia and hemiparesis following cerebral infarction affecting right dominant side: Secondary | ICD-10-CM | POA: Diagnosis not present

## 2020-03-12 DIAGNOSIS — I6523 Occlusion and stenosis of bilateral carotid arteries: Secondary | ICD-10-CM | POA: Diagnosis not present

## 2020-03-12 DIAGNOSIS — Z85850 Personal history of malignant neoplasm of thyroid: Secondary | ICD-10-CM | POA: Diagnosis not present

## 2020-03-12 DIAGNOSIS — Z794 Long term (current) use of insulin: Secondary | ICD-10-CM | POA: Diagnosis not present

## 2020-03-12 DIAGNOSIS — Z9089 Acquired absence of other organs: Secondary | ICD-10-CM | POA: Diagnosis not present

## 2020-03-12 DIAGNOSIS — Z7982 Long term (current) use of aspirin: Secondary | ICD-10-CM | POA: Diagnosis not present

## 2020-03-12 DIAGNOSIS — G7000 Myasthenia gravis without (acute) exacerbation: Secondary | ICD-10-CM | POA: Diagnosis not present

## 2020-03-12 DIAGNOSIS — M542 Cervicalgia: Secondary | ICD-10-CM | POA: Diagnosis not present

## 2020-03-12 DIAGNOSIS — M150 Primary generalized (osteo)arthritis: Secondary | ICD-10-CM | POA: Diagnosis not present

## 2020-03-12 DIAGNOSIS — G4733 Obstructive sleep apnea (adult) (pediatric): Secondary | ICD-10-CM | POA: Diagnosis not present

## 2020-03-12 DIAGNOSIS — G894 Chronic pain syndrome: Secondary | ICD-10-CM | POA: Diagnosis not present

## 2020-03-12 DIAGNOSIS — K219 Gastro-esophageal reflux disease without esophagitis: Secondary | ICD-10-CM | POA: Diagnosis not present

## 2020-03-12 DIAGNOSIS — J45909 Unspecified asthma, uncomplicated: Secondary | ICD-10-CM | POA: Diagnosis not present

## 2020-03-12 DIAGNOSIS — E785 Hyperlipidemia, unspecified: Secondary | ICD-10-CM | POA: Diagnosis not present

## 2020-03-12 DIAGNOSIS — I119 Hypertensive heart disease without heart failure: Secondary | ICD-10-CM | POA: Diagnosis not present

## 2020-03-12 DIAGNOSIS — E1165 Type 2 diabetes mellitus with hyperglycemia: Secondary | ICD-10-CM | POA: Diagnosis not present

## 2020-03-12 DIAGNOSIS — E890 Postprocedural hypothyroidism: Secondary | ICD-10-CM | POA: Diagnosis not present

## 2020-03-13 ENCOUNTER — Telehealth: Payer: Self-pay

## 2020-03-13 DIAGNOSIS — G7000 Myasthenia gravis without (acute) exacerbation: Secondary | ICD-10-CM | POA: Diagnosis not present

## 2020-03-13 DIAGNOSIS — K219 Gastro-esophageal reflux disease without esophagitis: Secondary | ICD-10-CM | POA: Diagnosis not present

## 2020-03-13 DIAGNOSIS — I69351 Hemiplegia and hemiparesis following cerebral infarction affecting right dominant side: Secondary | ICD-10-CM | POA: Diagnosis not present

## 2020-03-13 DIAGNOSIS — M542 Cervicalgia: Secondary | ICD-10-CM | POA: Diagnosis not present

## 2020-03-13 DIAGNOSIS — E785 Hyperlipidemia, unspecified: Secondary | ICD-10-CM | POA: Diagnosis not present

## 2020-03-13 DIAGNOSIS — G4733 Obstructive sleep apnea (adult) (pediatric): Secondary | ICD-10-CM | POA: Diagnosis not present

## 2020-03-13 DIAGNOSIS — M150 Primary generalized (osteo)arthritis: Secondary | ICD-10-CM | POA: Diagnosis not present

## 2020-03-13 DIAGNOSIS — Z7982 Long term (current) use of aspirin: Secondary | ICD-10-CM | POA: Diagnosis not present

## 2020-03-13 DIAGNOSIS — G894 Chronic pain syndrome: Secondary | ICD-10-CM | POA: Diagnosis not present

## 2020-03-13 DIAGNOSIS — I6523 Occlusion and stenosis of bilateral carotid arteries: Secondary | ICD-10-CM | POA: Diagnosis not present

## 2020-03-13 DIAGNOSIS — J45909 Unspecified asthma, uncomplicated: Secondary | ICD-10-CM | POA: Diagnosis not present

## 2020-03-13 DIAGNOSIS — Z85850 Personal history of malignant neoplasm of thyroid: Secondary | ICD-10-CM | POA: Diagnosis not present

## 2020-03-13 DIAGNOSIS — E890 Postprocedural hypothyroidism: Secondary | ICD-10-CM | POA: Diagnosis not present

## 2020-03-13 DIAGNOSIS — Z9089 Acquired absence of other organs: Secondary | ICD-10-CM | POA: Diagnosis not present

## 2020-03-13 DIAGNOSIS — E1165 Type 2 diabetes mellitus with hyperglycemia: Secondary | ICD-10-CM | POA: Diagnosis not present

## 2020-03-13 DIAGNOSIS — I119 Hypertensive heart disease without heart failure: Secondary | ICD-10-CM | POA: Diagnosis not present

## 2020-03-13 DIAGNOSIS — Z794 Long term (current) use of insulin: Secondary | ICD-10-CM | POA: Diagnosis not present

## 2020-03-13 NOTE — Telephone Encounter (Signed)
Verbal orders given to [employer] with [facility]. PT twice a week for 6 weeks then once a week for 3 weeks. Focusing on balancing & standing. Progressing to ambulation.

## 2020-03-15 DIAGNOSIS — E1165 Type 2 diabetes mellitus with hyperglycemia: Secondary | ICD-10-CM | POA: Diagnosis not present

## 2020-03-15 DIAGNOSIS — I119 Hypertensive heart disease without heart failure: Secondary | ICD-10-CM | POA: Diagnosis not present

## 2020-03-15 DIAGNOSIS — G7000 Myasthenia gravis without (acute) exacerbation: Secondary | ICD-10-CM | POA: Diagnosis not present

## 2020-03-15 DIAGNOSIS — I69351 Hemiplegia and hemiparesis following cerebral infarction affecting right dominant side: Secondary | ICD-10-CM | POA: Diagnosis not present

## 2020-03-18 DIAGNOSIS — G7000 Myasthenia gravis without (acute) exacerbation: Secondary | ICD-10-CM | POA: Diagnosis not present

## 2020-03-18 DIAGNOSIS — I6523 Occlusion and stenosis of bilateral carotid arteries: Secondary | ICD-10-CM | POA: Diagnosis not present

## 2020-03-18 DIAGNOSIS — E785 Hyperlipidemia, unspecified: Secondary | ICD-10-CM | POA: Diagnosis not present

## 2020-03-18 DIAGNOSIS — Z9089 Acquired absence of other organs: Secondary | ICD-10-CM | POA: Diagnosis not present

## 2020-03-18 DIAGNOSIS — Z7982 Long term (current) use of aspirin: Secondary | ICD-10-CM | POA: Diagnosis not present

## 2020-03-18 DIAGNOSIS — M150 Primary generalized (osteo)arthritis: Secondary | ICD-10-CM | POA: Diagnosis not present

## 2020-03-18 DIAGNOSIS — G894 Chronic pain syndrome: Secondary | ICD-10-CM | POA: Diagnosis not present

## 2020-03-18 DIAGNOSIS — G4733 Obstructive sleep apnea (adult) (pediatric): Secondary | ICD-10-CM | POA: Diagnosis not present

## 2020-03-18 DIAGNOSIS — I119 Hypertensive heart disease without heart failure: Secondary | ICD-10-CM | POA: Diagnosis not present

## 2020-03-18 DIAGNOSIS — E890 Postprocedural hypothyroidism: Secondary | ICD-10-CM | POA: Diagnosis not present

## 2020-03-18 DIAGNOSIS — K219 Gastro-esophageal reflux disease without esophagitis: Secondary | ICD-10-CM | POA: Diagnosis not present

## 2020-03-18 DIAGNOSIS — E1165 Type 2 diabetes mellitus with hyperglycemia: Secondary | ICD-10-CM | POA: Diagnosis not present

## 2020-03-18 DIAGNOSIS — I69351 Hemiplegia and hemiparesis following cerebral infarction affecting right dominant side: Secondary | ICD-10-CM | POA: Diagnosis not present

## 2020-03-18 DIAGNOSIS — Z794 Long term (current) use of insulin: Secondary | ICD-10-CM | POA: Diagnosis not present

## 2020-03-18 DIAGNOSIS — J45909 Unspecified asthma, uncomplicated: Secondary | ICD-10-CM | POA: Diagnosis not present

## 2020-03-18 DIAGNOSIS — Z85850 Personal history of malignant neoplasm of thyroid: Secondary | ICD-10-CM | POA: Diagnosis not present

## 2020-03-18 DIAGNOSIS — M542 Cervicalgia: Secondary | ICD-10-CM | POA: Diagnosis not present

## 2020-03-20 DIAGNOSIS — G894 Chronic pain syndrome: Secondary | ICD-10-CM | POA: Diagnosis not present

## 2020-03-20 DIAGNOSIS — Z85850 Personal history of malignant neoplasm of thyroid: Secondary | ICD-10-CM | POA: Diagnosis not present

## 2020-03-20 DIAGNOSIS — I119 Hypertensive heart disease without heart failure: Secondary | ICD-10-CM | POA: Diagnosis not present

## 2020-03-20 DIAGNOSIS — I6523 Occlusion and stenosis of bilateral carotid arteries: Secondary | ICD-10-CM | POA: Diagnosis not present

## 2020-03-20 DIAGNOSIS — E785 Hyperlipidemia, unspecified: Secondary | ICD-10-CM | POA: Diagnosis not present

## 2020-03-20 DIAGNOSIS — I69351 Hemiplegia and hemiparesis following cerebral infarction affecting right dominant side: Secondary | ICD-10-CM | POA: Diagnosis not present

## 2020-03-20 DIAGNOSIS — G7000 Myasthenia gravis without (acute) exacerbation: Secondary | ICD-10-CM | POA: Diagnosis not present

## 2020-03-20 DIAGNOSIS — E1165 Type 2 diabetes mellitus with hyperglycemia: Secondary | ICD-10-CM | POA: Diagnosis not present

## 2020-03-20 DIAGNOSIS — J45909 Unspecified asthma, uncomplicated: Secondary | ICD-10-CM | POA: Diagnosis not present

## 2020-03-20 DIAGNOSIS — Z794 Long term (current) use of insulin: Secondary | ICD-10-CM | POA: Diagnosis not present

## 2020-03-20 DIAGNOSIS — M150 Primary generalized (osteo)arthritis: Secondary | ICD-10-CM | POA: Diagnosis not present

## 2020-03-20 DIAGNOSIS — K219 Gastro-esophageal reflux disease without esophagitis: Secondary | ICD-10-CM | POA: Diagnosis not present

## 2020-03-20 DIAGNOSIS — M542 Cervicalgia: Secondary | ICD-10-CM | POA: Diagnosis not present

## 2020-03-20 DIAGNOSIS — E890 Postprocedural hypothyroidism: Secondary | ICD-10-CM | POA: Diagnosis not present

## 2020-03-20 DIAGNOSIS — Z7982 Long term (current) use of aspirin: Secondary | ICD-10-CM | POA: Diagnosis not present

## 2020-03-20 DIAGNOSIS — G4733 Obstructive sleep apnea (adult) (pediatric): Secondary | ICD-10-CM | POA: Diagnosis not present

## 2020-03-20 DIAGNOSIS — Z9089 Acquired absence of other organs: Secondary | ICD-10-CM | POA: Diagnosis not present

## 2020-03-22 ENCOUNTER — Encounter (HOSPITAL_BASED_OUTPATIENT_CLINIC_OR_DEPARTMENT_OTHER): Payer: Medicare Other | Admitting: Physical Medicine & Rehabilitation

## 2020-03-22 ENCOUNTER — Other Ambulatory Visit: Payer: Self-pay

## 2020-03-22 ENCOUNTER — Encounter: Payer: Self-pay | Admitting: Physical Medicine & Rehabilitation

## 2020-03-22 VITALS — BP 146/79 | HR 91 | Temp 97.8°F | Ht 63.0 in | Wt 198.8 lb

## 2020-03-22 DIAGNOSIS — R609 Edema, unspecified: Secondary | ICD-10-CM | POA: Diagnosis not present

## 2020-03-22 DIAGNOSIS — I639 Cerebral infarction, unspecified: Secondary | ICD-10-CM | POA: Diagnosis not present

## 2020-03-22 DIAGNOSIS — G7000 Myasthenia gravis without (acute) exacerbation: Secondary | ICD-10-CM | POA: Diagnosis not present

## 2020-03-22 DIAGNOSIS — I10 Essential (primary) hypertension: Secondary | ICD-10-CM | POA: Diagnosis not present

## 2020-03-22 DIAGNOSIS — I6381 Other cerebral infarction due to occlusion or stenosis of small artery: Secondary | ICD-10-CM

## 2020-03-22 DIAGNOSIS — E890 Postprocedural hypothyroidism: Secondary | ICD-10-CM | POA: Diagnosis not present

## 2020-03-22 DIAGNOSIS — E1165 Type 2 diabetes mellitus with hyperglycemia: Secondary | ICD-10-CM | POA: Diagnosis not present

## 2020-03-22 DIAGNOSIS — E119 Type 2 diabetes mellitus without complications: Secondary | ICD-10-CM | POA: Diagnosis not present

## 2020-03-22 DIAGNOSIS — E559 Vitamin D deficiency, unspecified: Secondary | ICD-10-CM | POA: Diagnosis not present

## 2020-03-22 DIAGNOSIS — Z794 Long term (current) use of insulin: Secondary | ICD-10-CM | POA: Diagnosis not present

## 2020-03-22 NOTE — Patient Instructions (Signed)
Warning Signs of a Stroke  A stroke is a medical emergency and should be treated right away--every second counts. A stroke is caused by a decrease or block in blood flow to the brain. When this occurs, certain areas of the brain do not get enough oxygen, and brain cells begin to die. A stroke can lead to brain damage and can sometimes be life-threatening. However, if someone having a stroke gets medical treatment right away, he or she has better chances of surviving and recovering from the stroke. Being able to recognize the symptoms of a stroke is very important. Types of strokes There are two main types of strokes:  Ischemic strokes. This is the most common type of stroke. These strokes happen when a blood vessel that supplies blood to the brain is being blocked.  Hemorrhagic strokes. These strokes result from bleeding in the brain due to a blood vessel leaking or bursting (rupturing). A transient ischemic attack (TIA) is a "warning stroke" that causes stroke-like symptoms that go away quickly. Unlike a stroke, a TIA does not cause permanent damage to the brain. However, the symptoms of a TIA are the same as a stroke, and they also require medical treatment right away. Having a TIA is a sign that you are at higher risk for a permanent stroke. Warning signs of a stroke The symptoms of stroke may vary and will reflect the part of the brain that is involved. Symptoms usually happen suddenly. "BE FAST" is an easy way to remember the main warning signs of a stroke. B - Balance Signs are dizziness, sudden trouble walking, or loss of balance. E - Eyes Signs are trouble seeing or a sudden change in vision. F - Face Signs are sudden weakness or numbness of the face, or the face or eyelid drooping on one side. A - Arms Signs are weakness or numbness in an arm. This happens suddenly and usually on one side of the body. S - Speech Signs are sudden trouble speaking, slurred speech, or trouble  understanding what people say. T - Time Time to call emergency services. Write down what time symptoms started. Other signs of a stroke Some less common signs of a stroke include:  A sudden, severe headache with no known cause.  Nausea or vomiting.  Seizure. A stroke may be happening even if only one "BE FAST" symptoms is present. These symptoms may represent a serious problem that is an emergency. Do not wait to see if the symptoms will go away. Get medical help right away. Call your local emergency services (911 in the [place] Do not drive yourself to the hospital. Summary  A stroke is a medical emergency and should be treated right away--every second counts.  "BE FAST" is an easy way to remember the main warning signs of a stroke.  Call local emergency services right away if you or someone else has any stroke symptoms, even if the symptoms go away.  Make note of what time the first symptoms appeared. Emergency responders or emergency room staff will need to know this information.  Do not wait to see if symptoms will go away. Call 911 even if only one of the "BE FAST" symptoms appears. This information is not intended to replace advice given to you by your health care provider. Make sure you discuss any questions you have with your health care provider. Document Revised: 07/23/2017 Document Reviewed: 11/27/2016 Elsevier Patient Education  2020 Elsevier Inc.  

## 2020-03-22 NOTE — Progress Notes (Signed)
Subjective:    Patient ID: [name], female    DOB: 1950-09-07, 69 y.o.   MRN: [id number]  HPI Getting IVIG at [facility]  q 6 wks, Rituxan q74mo due again in October No feeling in 4th and 5th digits in the RIght hand Numbness right side of nose Right ankle  Pain improved has noted Left ankle swelling   Still using walker but is mod I Still gets PT but OT stopped  Working on cane ambulation  Pain Inventory Average Pain 7 Pain Right Now 2 My pain is intermittent and aching  In the last 24 hours, has pain interfered with the following? General activity 0 Relation with others 0 Enjoyment of life 0 What TIME of day is your pain at its worst? Pain happens at anytime. Sleep (in general) Fair  Pain is worse with: Unsure Pain improves with: medication Relief from Meds: ibuprofen  Mobility walk with assistance use a walker how many minutes can you walk? Unsure ability to climb steps?  yes do you drive?  no transfers alone Do you have any goals in this area?  yes  Function retired I need assistance with the following:  household duties and shopping Do you have any goals in this area?  yes  Neuro/Psych weakness numbness tremor tingling trouble walking dizziness  Prior Studies Any changes since last visit?  no  Physicians involved in your care Any changes since last visit?  no   Family History  Problem Relation Age of Onset  . Coronary artery disease Sister        s/p coronary stenting  . Stroke Sister 83  . Diabetes Sister   . Congestive Heart Failure Mother   . Asthma Mother   . Diabetes Mother   . Congestive Heart Failure Father   . Lung cancer Father   . Asthma Father   . Diabetes Father   . Diabetes Brother    Social History   Socioeconomic History  . Marital status: Single    Spouse name: Not on file  . Number of children: 0  . Years of education: college  . Highest education level: Not on file  Occupational History  . Occupation: retired    Tobacco Use  . Smoking status: Never Smoker  . Smokeless tobacco: Never Used  Vaping Use  . Vaping Use: Never used  Substance and Sexual Activity  . Alcohol use: No    Alcohol/week: 0.0 standard drinks  . Drug use: No  . Sexual activity: Never  Other Topics Concern  . Not on file  Social History Narrative   Caffeine none.   Social Determinants of Health   Financial Resource Strain:   . Difficulty of Paying Living Expenses:   Food Insecurity:   . Worried About Charity fundraiser in the Last Year:   . Arboriculturist in the Last Year:   Transportation Needs:   . Film/video editor (Medical):   Marland Kitchen Lack of Transportation (Non-Medical):   Physical Activity:   . Days of Exercise per Week:   . Minutes of Exercise per Session:   Stress:   . Feeling of Stress :   Social Connections:   . Frequency of Communication with Friends and Family:   . Frequency of Social Gatherings with Friends and Family:   . Attends Religious Services:   . Active Member of Clubs or Organizations:   . Attends Archivist Meetings:   Marland Kitchen Marital Status:    Past  Surgical History:  Procedure Laterality Date  . ABDOMINAL HYSTERECTOMY    . ANTERIOR CERVICAL DECOMP/DISCECTOMY FUSION     "I've had severa ORs; always went in from the front" (03/13/2013)  . APPENDECTOMY    . CARDIAC CATHETERIZATION     "several" (03/13/2013)  . CARPAL TUNNEL RELEASE Right   . CATARACT EXTRACTION W/ INTRAOCULAR LENS  IMPLANT, BILATERAL Bilateral   . CHOLECYSTECTOMY    . KNEE ARTHROSCOPY Left   . SHOULDER ARTHROSCOPY W/ ROTATOR CUFF REPAIR Left   . TONSILLECTOMY    . TOTAL THYROIDECTOMY     Past Medical History:  Diagnosis Date  . Arthritis    "all over my body" (03/13/2013)  . Asthma   . Asymptomatic carotid artery stenosis, bilateral 10/08/2018  . GERD (gastroesophageal reflux disease)   . H/O hiatal hernia   . Heart murmur   . Hypercholesterolemia 10/08/2018  . Hypertension   . Hypothyroidism   .  Myasthenia gravis ([place])    "in my eyes; diagnsosed > 7 yr ago" (03/13/2013)  . Sleep apnea    on CPAP  . Thyroid carcinoma ([place])   . Type II diabetes mellitus (HCC)    BP (!) 146/79   Pulse 91   Temp 97.8 F (36.6 C)   Ht 5\' 3"  (1.6 m)   Wt 198 lb 12.8 oz (90.2 kg)   SpO2 98%   BMI 35.22 kg/m   Opioid Risk Score:   Fall Risk Score:  `1  Depression screen PHQ 2/9  Depression screen [facility] 2/9 02/22/2020 01/17/2020  Decreased Interest 0 1  Down, Depressed, Hopeless 0 0  PHQ - 2 Score 0 1  Altered sleeping - 0  Tired, decreased energy - 1  Change in appetite - 0  Feeling bad or failure about yourself  - 0  Trouble concentrating - 0  Moving slowly or fidgety/restless - 0  Suicidal thoughts - 0  PHQ-9 Score - 2  Difficult doing work/chores - Not difficult at all   Review of Systems  Constitutional: Negative.        Weight gain  HENT: Negative.   Eyes: Negative.   Respiratory: Negative.   Cardiovascular: Positive for leg swelling.       Left foot swelling  Gastrointestinal: Negative.   Endocrine: Negative.   Genitourinary: Negative.   Musculoskeletal: Positive for gait problem.  Skin: Negative.   Allergic/Immunologic: Negative.   Neurological: Positive for dizziness, weakness and numbness.  Hematological: Negative.   Psychiatric/Behavioral: Negative.        Objective:   Physical Exam Vitals and nursing note reviewed.  Constitutional:      Appearance: She is obese.  HENT:     Head: Normocephalic and atraumatic.  Eyes:     Extraocular Movements: Extraocular movements intact.     Conjunctiva/sclera: Conjunctivae normal.     Pupils: Pupils are equal, round, and reactive to light.  Neurological:     Mental Status: She is alert.     Comments: Mild ptosis bilateral  Motor 4+/5 R delt , bi, tri, grip,HF , KE ADF, 5/5 on Left side Sensation _ insensate Right 5th digit and altered Right 3rd digit, thumb, able to sense in LE and Right side face but feels different than  on the left side   Psychiatric:        Mood and Affect: Mood normal.        Behavior: Behavior normal.        Thought Content: Thought content normal.  Assessment & Plan:  1.  Left thalamic infarct , cont HHPT, may benefit from OP if pt cannot achieve premorbid level after HH is done.   Discussed signs and symptoms of stroke Stroke timeframe of recovery in regards to sensation  2.  MG f/u  [facility]

## 2020-03-25 DIAGNOSIS — Z9089 Acquired absence of other organs: Secondary | ICD-10-CM | POA: Diagnosis not present

## 2020-03-25 DIAGNOSIS — Z794 Long term (current) use of insulin: Secondary | ICD-10-CM | POA: Diagnosis not present

## 2020-03-25 DIAGNOSIS — M150 Primary generalized (osteo)arthritis: Secondary | ICD-10-CM | POA: Diagnosis not present

## 2020-03-25 DIAGNOSIS — I119 Hypertensive heart disease without heart failure: Secondary | ICD-10-CM | POA: Diagnosis not present

## 2020-03-25 DIAGNOSIS — E785 Hyperlipidemia, unspecified: Secondary | ICD-10-CM | POA: Diagnosis not present

## 2020-03-25 DIAGNOSIS — G894 Chronic pain syndrome: Secondary | ICD-10-CM | POA: Diagnosis not present

## 2020-03-25 DIAGNOSIS — Z7982 Long term (current) use of aspirin: Secondary | ICD-10-CM | POA: Diagnosis not present

## 2020-03-25 DIAGNOSIS — G4733 Obstructive sleep apnea (adult) (pediatric): Secondary | ICD-10-CM | POA: Diagnosis not present

## 2020-03-25 DIAGNOSIS — M542 Cervicalgia: Secondary | ICD-10-CM | POA: Diagnosis not present

## 2020-03-25 DIAGNOSIS — G7000 Myasthenia gravis without (acute) exacerbation: Secondary | ICD-10-CM | POA: Diagnosis not present

## 2020-03-25 DIAGNOSIS — E890 Postprocedural hypothyroidism: Secondary | ICD-10-CM | POA: Diagnosis not present

## 2020-03-25 DIAGNOSIS — K219 Gastro-esophageal reflux disease without esophagitis: Secondary | ICD-10-CM | POA: Diagnosis not present

## 2020-03-25 DIAGNOSIS — Z85850 Personal history of malignant neoplasm of thyroid: Secondary | ICD-10-CM | POA: Diagnosis not present

## 2020-03-25 DIAGNOSIS — J45909 Unspecified asthma, uncomplicated: Secondary | ICD-10-CM | POA: Diagnosis not present

## 2020-03-25 DIAGNOSIS — I69351 Hemiplegia and hemiparesis following cerebral infarction affecting right dominant side: Secondary | ICD-10-CM | POA: Diagnosis not present

## 2020-03-25 DIAGNOSIS — I6523 Occlusion and stenosis of bilateral carotid arteries: Secondary | ICD-10-CM | POA: Diagnosis not present

## 2020-03-25 DIAGNOSIS — E1165 Type 2 diabetes mellitus with hyperglycemia: Secondary | ICD-10-CM | POA: Diagnosis not present

## 2020-03-27 DIAGNOSIS — G4733 Obstructive sleep apnea (adult) (pediatric): Secondary | ICD-10-CM | POA: Diagnosis not present

## 2020-03-27 DIAGNOSIS — I69351 Hemiplegia and hemiparesis following cerebral infarction affecting right dominant side: Secondary | ICD-10-CM | POA: Diagnosis not present

## 2020-03-27 DIAGNOSIS — Z9089 Acquired absence of other organs: Secondary | ICD-10-CM | POA: Diagnosis not present

## 2020-03-27 DIAGNOSIS — Z794 Long term (current) use of insulin: Secondary | ICD-10-CM | POA: Diagnosis not present

## 2020-03-27 DIAGNOSIS — M542 Cervicalgia: Secondary | ICD-10-CM | POA: Diagnosis not present

## 2020-03-27 DIAGNOSIS — K219 Gastro-esophageal reflux disease without esophagitis: Secondary | ICD-10-CM | POA: Diagnosis not present

## 2020-03-27 DIAGNOSIS — G894 Chronic pain syndrome: Secondary | ICD-10-CM | POA: Diagnosis not present

## 2020-03-27 DIAGNOSIS — G7000 Myasthenia gravis without (acute) exacerbation: Secondary | ICD-10-CM | POA: Diagnosis not present

## 2020-03-27 DIAGNOSIS — Z85850 Personal history of malignant neoplasm of thyroid: Secondary | ICD-10-CM | POA: Diagnosis not present

## 2020-03-27 DIAGNOSIS — E1165 Type 2 diabetes mellitus with hyperglycemia: Secondary | ICD-10-CM | POA: Diagnosis not present

## 2020-03-27 DIAGNOSIS — E785 Hyperlipidemia, unspecified: Secondary | ICD-10-CM | POA: Diagnosis not present

## 2020-03-27 DIAGNOSIS — I119 Hypertensive heart disease without heart failure: Secondary | ICD-10-CM | POA: Diagnosis not present

## 2020-03-27 DIAGNOSIS — J45909 Unspecified asthma, uncomplicated: Secondary | ICD-10-CM | POA: Diagnosis not present

## 2020-03-27 DIAGNOSIS — E890 Postprocedural hypothyroidism: Secondary | ICD-10-CM | POA: Diagnosis not present

## 2020-03-27 DIAGNOSIS — M150 Primary generalized (osteo)arthritis: Secondary | ICD-10-CM | POA: Diagnosis not present

## 2020-03-27 DIAGNOSIS — I6523 Occlusion and stenosis of bilateral carotid arteries: Secondary | ICD-10-CM | POA: Diagnosis not present

## 2020-03-27 DIAGNOSIS — Z7982 Long term (current) use of aspirin: Secondary | ICD-10-CM | POA: Diagnosis not present

## 2020-03-29 DIAGNOSIS — I10 Essential (primary) hypertension: Secondary | ICD-10-CM | POA: Diagnosis not present

## 2020-03-29 DIAGNOSIS — E1165 Type 2 diabetes mellitus with hyperglycemia: Secondary | ICD-10-CM | POA: Diagnosis not present

## 2020-03-29 DIAGNOSIS — E890 Postprocedural hypothyroidism: Secondary | ICD-10-CM | POA: Diagnosis not present

## 2020-03-29 DIAGNOSIS — C73 Malignant neoplasm of thyroid gland: Secondary | ICD-10-CM | POA: Diagnosis not present

## 2020-04-01 DIAGNOSIS — I119 Hypertensive heart disease without heart failure: Secondary | ICD-10-CM | POA: Diagnosis not present

## 2020-04-01 DIAGNOSIS — Z7982 Long term (current) use of aspirin: Secondary | ICD-10-CM | POA: Diagnosis not present

## 2020-04-01 DIAGNOSIS — G7000 Myasthenia gravis without (acute) exacerbation: Secondary | ICD-10-CM | POA: Diagnosis not present

## 2020-04-01 DIAGNOSIS — Z9089 Acquired absence of other organs: Secondary | ICD-10-CM | POA: Diagnosis not present

## 2020-04-01 DIAGNOSIS — M542 Cervicalgia: Secondary | ICD-10-CM | POA: Diagnosis not present

## 2020-04-01 DIAGNOSIS — I6523 Occlusion and stenosis of bilateral carotid arteries: Secondary | ICD-10-CM | POA: Diagnosis not present

## 2020-04-01 DIAGNOSIS — Z85850 Personal history of malignant neoplasm of thyroid: Secondary | ICD-10-CM | POA: Diagnosis not present

## 2020-04-01 DIAGNOSIS — G894 Chronic pain syndrome: Secondary | ICD-10-CM | POA: Diagnosis not present

## 2020-04-01 DIAGNOSIS — K219 Gastro-esophageal reflux disease without esophagitis: Secondary | ICD-10-CM | POA: Diagnosis not present

## 2020-04-01 DIAGNOSIS — E785 Hyperlipidemia, unspecified: Secondary | ICD-10-CM | POA: Diagnosis not present

## 2020-04-01 DIAGNOSIS — M150 Primary generalized (osteo)arthritis: Secondary | ICD-10-CM | POA: Diagnosis not present

## 2020-04-01 DIAGNOSIS — E890 Postprocedural hypothyroidism: Secondary | ICD-10-CM | POA: Diagnosis not present

## 2020-04-01 DIAGNOSIS — J45909 Unspecified asthma, uncomplicated: Secondary | ICD-10-CM | POA: Diagnosis not present

## 2020-04-01 DIAGNOSIS — G4733 Obstructive sleep apnea (adult) (pediatric): Secondary | ICD-10-CM | POA: Diagnosis not present

## 2020-04-01 DIAGNOSIS — Z794 Long term (current) use of insulin: Secondary | ICD-10-CM | POA: Diagnosis not present

## 2020-04-01 DIAGNOSIS — E1165 Type 2 diabetes mellitus with hyperglycemia: Secondary | ICD-10-CM | POA: Diagnosis not present

## 2020-04-01 DIAGNOSIS — I69351 Hemiplegia and hemiparesis following cerebral infarction affecting right dominant side: Secondary | ICD-10-CM | POA: Diagnosis not present

## 2020-04-04 DIAGNOSIS — I6523 Occlusion and stenosis of bilateral carotid arteries: Secondary | ICD-10-CM | POA: Diagnosis not present

## 2020-04-04 DIAGNOSIS — Z7982 Long term (current) use of aspirin: Secondary | ICD-10-CM | POA: Diagnosis not present

## 2020-04-04 DIAGNOSIS — Z794 Long term (current) use of insulin: Secondary | ICD-10-CM | POA: Diagnosis not present

## 2020-04-04 DIAGNOSIS — J45909 Unspecified asthma, uncomplicated: Secondary | ICD-10-CM | POA: Diagnosis not present

## 2020-04-04 DIAGNOSIS — M150 Primary generalized (osteo)arthritis: Secondary | ICD-10-CM | POA: Diagnosis not present

## 2020-04-04 DIAGNOSIS — M542 Cervicalgia: Secondary | ICD-10-CM | POA: Diagnosis not present

## 2020-04-04 DIAGNOSIS — E785 Hyperlipidemia, unspecified: Secondary | ICD-10-CM | POA: Diagnosis not present

## 2020-04-04 DIAGNOSIS — K219 Gastro-esophageal reflux disease without esophagitis: Secondary | ICD-10-CM | POA: Diagnosis not present

## 2020-04-04 DIAGNOSIS — G4733 Obstructive sleep apnea (adult) (pediatric): Secondary | ICD-10-CM | POA: Diagnosis not present

## 2020-04-04 DIAGNOSIS — G894 Chronic pain syndrome: Secondary | ICD-10-CM | POA: Diagnosis not present

## 2020-04-04 DIAGNOSIS — E1165 Type 2 diabetes mellitus with hyperglycemia: Secondary | ICD-10-CM | POA: Diagnosis not present

## 2020-04-04 DIAGNOSIS — Z85850 Personal history of malignant neoplasm of thyroid: Secondary | ICD-10-CM | POA: Diagnosis not present

## 2020-04-04 DIAGNOSIS — I69351 Hemiplegia and hemiparesis following cerebral infarction affecting right dominant side: Secondary | ICD-10-CM | POA: Diagnosis not present

## 2020-04-04 DIAGNOSIS — G7000 Myasthenia gravis without (acute) exacerbation: Secondary | ICD-10-CM | POA: Diagnosis not present

## 2020-04-04 DIAGNOSIS — Z9089 Acquired absence of other organs: Secondary | ICD-10-CM | POA: Diagnosis not present

## 2020-04-04 DIAGNOSIS — E890 Postprocedural hypothyroidism: Secondary | ICD-10-CM | POA: Diagnosis not present

## 2020-04-04 DIAGNOSIS — I119 Hypertensive heart disease without heart failure: Secondary | ICD-10-CM | POA: Diagnosis not present

## 2020-04-10 DIAGNOSIS — I6523 Occlusion and stenosis of bilateral carotid arteries: Secondary | ICD-10-CM | POA: Diagnosis not present

## 2020-04-10 DIAGNOSIS — Z7982 Long term (current) use of aspirin: Secondary | ICD-10-CM | POA: Diagnosis not present

## 2020-04-10 DIAGNOSIS — M542 Cervicalgia: Secondary | ICD-10-CM | POA: Diagnosis not present

## 2020-04-10 DIAGNOSIS — I119 Hypertensive heart disease without heart failure: Secondary | ICD-10-CM | POA: Diagnosis not present

## 2020-04-10 DIAGNOSIS — E785 Hyperlipidemia, unspecified: Secondary | ICD-10-CM | POA: Diagnosis not present

## 2020-04-10 DIAGNOSIS — G894 Chronic pain syndrome: Secondary | ICD-10-CM | POA: Diagnosis not present

## 2020-04-10 DIAGNOSIS — G7000 Myasthenia gravis without (acute) exacerbation: Secondary | ICD-10-CM | POA: Diagnosis not present

## 2020-04-10 DIAGNOSIS — E890 Postprocedural hypothyroidism: Secondary | ICD-10-CM | POA: Diagnosis not present

## 2020-04-10 DIAGNOSIS — E1165 Type 2 diabetes mellitus with hyperglycemia: Secondary | ICD-10-CM | POA: Diagnosis not present

## 2020-04-10 DIAGNOSIS — Z9089 Acquired absence of other organs: Secondary | ICD-10-CM | POA: Diagnosis not present

## 2020-04-10 DIAGNOSIS — G4733 Obstructive sleep apnea (adult) (pediatric): Secondary | ICD-10-CM | POA: Diagnosis not present

## 2020-04-10 DIAGNOSIS — Z85850 Personal history of malignant neoplasm of thyroid: Secondary | ICD-10-CM | POA: Diagnosis not present

## 2020-04-10 DIAGNOSIS — K219 Gastro-esophageal reflux disease without esophagitis: Secondary | ICD-10-CM | POA: Diagnosis not present

## 2020-04-10 DIAGNOSIS — I69351 Hemiplegia and hemiparesis following cerebral infarction affecting right dominant side: Secondary | ICD-10-CM | POA: Diagnosis not present

## 2020-04-10 DIAGNOSIS — J45909 Unspecified asthma, uncomplicated: Secondary | ICD-10-CM | POA: Diagnosis not present

## 2020-04-10 DIAGNOSIS — Z794 Long term (current) use of insulin: Secondary | ICD-10-CM | POA: Diagnosis not present

## 2020-04-10 DIAGNOSIS — M150 Primary generalized (osteo)arthritis: Secondary | ICD-10-CM | POA: Diagnosis not present

## 2020-04-11 DIAGNOSIS — I6523 Occlusion and stenosis of bilateral carotid arteries: Secondary | ICD-10-CM | POA: Diagnosis not present

## 2020-04-11 DIAGNOSIS — I119 Hypertensive heart disease without heart failure: Secondary | ICD-10-CM | POA: Diagnosis not present

## 2020-04-11 DIAGNOSIS — J45909 Unspecified asthma, uncomplicated: Secondary | ICD-10-CM | POA: Diagnosis not present

## 2020-04-11 DIAGNOSIS — M150 Primary generalized (osteo)arthritis: Secondary | ICD-10-CM | POA: Diagnosis not present

## 2020-04-11 DIAGNOSIS — G894 Chronic pain syndrome: Secondary | ICD-10-CM | POA: Diagnosis not present

## 2020-04-11 DIAGNOSIS — G7000 Myasthenia gravis without (acute) exacerbation: Secondary | ICD-10-CM | POA: Diagnosis not present

## 2020-04-11 DIAGNOSIS — G4733 Obstructive sleep apnea (adult) (pediatric): Secondary | ICD-10-CM | POA: Diagnosis not present

## 2020-04-11 DIAGNOSIS — Z85850 Personal history of malignant neoplasm of thyroid: Secondary | ICD-10-CM | POA: Diagnosis not present

## 2020-04-11 DIAGNOSIS — I69351 Hemiplegia and hemiparesis following cerebral infarction affecting right dominant side: Secondary | ICD-10-CM | POA: Diagnosis not present

## 2020-04-11 DIAGNOSIS — Z7982 Long term (current) use of aspirin: Secondary | ICD-10-CM | POA: Diagnosis not present

## 2020-04-11 DIAGNOSIS — E890 Postprocedural hypothyroidism: Secondary | ICD-10-CM | POA: Diagnosis not present

## 2020-04-11 DIAGNOSIS — E1165 Type 2 diabetes mellitus with hyperglycemia: Secondary | ICD-10-CM | POA: Diagnosis not present

## 2020-04-11 DIAGNOSIS — K219 Gastro-esophageal reflux disease without esophagitis: Secondary | ICD-10-CM | POA: Diagnosis not present

## 2020-04-11 DIAGNOSIS — Z9089 Acquired absence of other organs: Secondary | ICD-10-CM | POA: Diagnosis not present

## 2020-04-11 DIAGNOSIS — M542 Cervicalgia: Secondary | ICD-10-CM | POA: Diagnosis not present

## 2020-04-11 DIAGNOSIS — E785 Hyperlipidemia, unspecified: Secondary | ICD-10-CM | POA: Diagnosis not present

## 2020-04-11 DIAGNOSIS — Z794 Long term (current) use of insulin: Secondary | ICD-10-CM | POA: Diagnosis not present

## 2020-04-15 DIAGNOSIS — I69351 Hemiplegia and hemiparesis following cerebral infarction affecting right dominant side: Secondary | ICD-10-CM | POA: Diagnosis not present

## 2020-04-15 DIAGNOSIS — Z7982 Long term (current) use of aspirin: Secondary | ICD-10-CM | POA: Diagnosis not present

## 2020-04-15 DIAGNOSIS — M542 Cervicalgia: Secondary | ICD-10-CM | POA: Diagnosis not present

## 2020-04-15 DIAGNOSIS — J45909 Unspecified asthma, uncomplicated: Secondary | ICD-10-CM | POA: Diagnosis not present

## 2020-04-15 DIAGNOSIS — K219 Gastro-esophageal reflux disease without esophagitis: Secondary | ICD-10-CM | POA: Diagnosis not present

## 2020-04-15 DIAGNOSIS — E1165 Type 2 diabetes mellitus with hyperglycemia: Secondary | ICD-10-CM | POA: Diagnosis not present

## 2020-04-15 DIAGNOSIS — G894 Chronic pain syndrome: Secondary | ICD-10-CM | POA: Diagnosis not present

## 2020-04-15 DIAGNOSIS — E890 Postprocedural hypothyroidism: Secondary | ICD-10-CM | POA: Diagnosis not present

## 2020-04-15 DIAGNOSIS — G4733 Obstructive sleep apnea (adult) (pediatric): Secondary | ICD-10-CM | POA: Diagnosis not present

## 2020-04-15 DIAGNOSIS — Z9089 Acquired absence of other organs: Secondary | ICD-10-CM | POA: Diagnosis not present

## 2020-04-15 DIAGNOSIS — I6523 Occlusion and stenosis of bilateral carotid arteries: Secondary | ICD-10-CM | POA: Diagnosis not present

## 2020-04-15 DIAGNOSIS — Z85850 Personal history of malignant neoplasm of thyroid: Secondary | ICD-10-CM | POA: Diagnosis not present

## 2020-04-15 DIAGNOSIS — E785 Hyperlipidemia, unspecified: Secondary | ICD-10-CM | POA: Diagnosis not present

## 2020-04-15 DIAGNOSIS — I119 Hypertensive heart disease without heart failure: Secondary | ICD-10-CM | POA: Diagnosis not present

## 2020-04-15 DIAGNOSIS — Z794 Long term (current) use of insulin: Secondary | ICD-10-CM | POA: Diagnosis not present

## 2020-04-15 DIAGNOSIS — G7000 Myasthenia gravis without (acute) exacerbation: Secondary | ICD-10-CM | POA: Diagnosis not present

## 2020-04-15 DIAGNOSIS — M150 Primary generalized (osteo)arthritis: Secondary | ICD-10-CM | POA: Diagnosis not present

## 2020-04-17 DIAGNOSIS — M150 Primary generalized (osteo)arthritis: Secondary | ICD-10-CM | POA: Diagnosis not present

## 2020-04-17 DIAGNOSIS — G7000 Myasthenia gravis without (acute) exacerbation: Secondary | ICD-10-CM | POA: Diagnosis not present

## 2020-04-17 DIAGNOSIS — E890 Postprocedural hypothyroidism: Secondary | ICD-10-CM | POA: Diagnosis not present

## 2020-04-17 DIAGNOSIS — I6523 Occlusion and stenosis of bilateral carotid arteries: Secondary | ICD-10-CM | POA: Diagnosis not present

## 2020-04-17 DIAGNOSIS — I69351 Hemiplegia and hemiparesis following cerebral infarction affecting right dominant side: Secondary | ICD-10-CM | POA: Diagnosis not present

## 2020-04-17 DIAGNOSIS — G894 Chronic pain syndrome: Secondary | ICD-10-CM | POA: Diagnosis not present

## 2020-04-17 DIAGNOSIS — Z85850 Personal history of malignant neoplasm of thyroid: Secondary | ICD-10-CM | POA: Diagnosis not present

## 2020-04-17 DIAGNOSIS — E1165 Type 2 diabetes mellitus with hyperglycemia: Secondary | ICD-10-CM | POA: Diagnosis not present

## 2020-04-17 DIAGNOSIS — J45909 Unspecified asthma, uncomplicated: Secondary | ICD-10-CM | POA: Diagnosis not present

## 2020-04-17 DIAGNOSIS — I119 Hypertensive heart disease without heart failure: Secondary | ICD-10-CM | POA: Diagnosis not present

## 2020-04-17 DIAGNOSIS — G4733 Obstructive sleep apnea (adult) (pediatric): Secondary | ICD-10-CM | POA: Diagnosis not present

## 2020-04-17 DIAGNOSIS — E785 Hyperlipidemia, unspecified: Secondary | ICD-10-CM | POA: Diagnosis not present

## 2020-04-17 DIAGNOSIS — M542 Cervicalgia: Secondary | ICD-10-CM | POA: Diagnosis not present

## 2020-04-17 DIAGNOSIS — K219 Gastro-esophageal reflux disease without esophagitis: Secondary | ICD-10-CM | POA: Diagnosis not present

## 2020-04-17 DIAGNOSIS — Z794 Long term (current) use of insulin: Secondary | ICD-10-CM | POA: Diagnosis not present

## 2020-04-17 DIAGNOSIS — Z9089 Acquired absence of other organs: Secondary | ICD-10-CM | POA: Diagnosis not present

## 2020-04-17 DIAGNOSIS — Z7982 Long term (current) use of aspirin: Secondary | ICD-10-CM | POA: Diagnosis not present

## 2020-04-22 DIAGNOSIS — G7000 Myasthenia gravis without (acute) exacerbation: Secondary | ICD-10-CM | POA: Diagnosis not present

## 2020-04-22 DIAGNOSIS — I6523 Occlusion and stenosis of bilateral carotid arteries: Secondary | ICD-10-CM | POA: Diagnosis not present

## 2020-04-22 DIAGNOSIS — G4733 Obstructive sleep apnea (adult) (pediatric): Secondary | ICD-10-CM | POA: Diagnosis not present

## 2020-04-22 DIAGNOSIS — Z7982 Long term (current) use of aspirin: Secondary | ICD-10-CM | POA: Diagnosis not present

## 2020-04-22 DIAGNOSIS — M542 Cervicalgia: Secondary | ICD-10-CM | POA: Diagnosis not present

## 2020-04-22 DIAGNOSIS — E785 Hyperlipidemia, unspecified: Secondary | ICD-10-CM | POA: Diagnosis not present

## 2020-04-22 DIAGNOSIS — E1165 Type 2 diabetes mellitus with hyperglycemia: Secondary | ICD-10-CM | POA: Diagnosis not present

## 2020-04-22 DIAGNOSIS — I69351 Hemiplegia and hemiparesis following cerebral infarction affecting right dominant side: Secondary | ICD-10-CM | POA: Diagnosis not present

## 2020-04-22 DIAGNOSIS — Z794 Long term (current) use of insulin: Secondary | ICD-10-CM | POA: Diagnosis not present

## 2020-04-22 DIAGNOSIS — G894 Chronic pain syndrome: Secondary | ICD-10-CM | POA: Diagnosis not present

## 2020-04-22 DIAGNOSIS — Z85850 Personal history of malignant neoplasm of thyroid: Secondary | ICD-10-CM | POA: Diagnosis not present

## 2020-04-22 DIAGNOSIS — J45909 Unspecified asthma, uncomplicated: Secondary | ICD-10-CM | POA: Diagnosis not present

## 2020-04-22 DIAGNOSIS — E890 Postprocedural hypothyroidism: Secondary | ICD-10-CM | POA: Diagnosis not present

## 2020-04-22 DIAGNOSIS — Z9089 Acquired absence of other organs: Secondary | ICD-10-CM | POA: Diagnosis not present

## 2020-04-22 DIAGNOSIS — I119 Hypertensive heart disease without heart failure: Secondary | ICD-10-CM | POA: Diagnosis not present

## 2020-04-22 DIAGNOSIS — K219 Gastro-esophageal reflux disease without esophagitis: Secondary | ICD-10-CM | POA: Diagnosis not present

## 2020-04-22 DIAGNOSIS — E119 Type 2 diabetes mellitus without complications: Secondary | ICD-10-CM | POA: Diagnosis not present

## 2020-04-22 DIAGNOSIS — M150 Primary generalized (osteo)arthritis: Secondary | ICD-10-CM | POA: Diagnosis not present

## 2020-04-24 DIAGNOSIS — Z7982 Long term (current) use of aspirin: Secondary | ICD-10-CM | POA: Diagnosis not present

## 2020-04-24 DIAGNOSIS — E785 Hyperlipidemia, unspecified: Secondary | ICD-10-CM | POA: Diagnosis not present

## 2020-04-24 DIAGNOSIS — I69351 Hemiplegia and hemiparesis following cerebral infarction affecting right dominant side: Secondary | ICD-10-CM | POA: Diagnosis not present

## 2020-04-24 DIAGNOSIS — Z85850 Personal history of malignant neoplasm of thyroid: Secondary | ICD-10-CM | POA: Diagnosis not present

## 2020-04-24 DIAGNOSIS — E890 Postprocedural hypothyroidism: Secondary | ICD-10-CM | POA: Diagnosis not present

## 2020-04-24 DIAGNOSIS — J45909 Unspecified asthma, uncomplicated: Secondary | ICD-10-CM | POA: Diagnosis not present

## 2020-04-24 DIAGNOSIS — G894 Chronic pain syndrome: Secondary | ICD-10-CM | POA: Diagnosis not present

## 2020-04-24 DIAGNOSIS — K219 Gastro-esophageal reflux disease without esophagitis: Secondary | ICD-10-CM | POA: Diagnosis not present

## 2020-04-24 DIAGNOSIS — I6523 Occlusion and stenosis of bilateral carotid arteries: Secondary | ICD-10-CM | POA: Diagnosis not present

## 2020-04-24 DIAGNOSIS — G4733 Obstructive sleep apnea (adult) (pediatric): Secondary | ICD-10-CM | POA: Diagnosis not present

## 2020-04-24 DIAGNOSIS — M150 Primary generalized (osteo)arthritis: Secondary | ICD-10-CM | POA: Diagnosis not present

## 2020-04-24 DIAGNOSIS — G7000 Myasthenia gravis without (acute) exacerbation: Secondary | ICD-10-CM | POA: Diagnosis not present

## 2020-04-24 DIAGNOSIS — Z794 Long term (current) use of insulin: Secondary | ICD-10-CM | POA: Diagnosis not present

## 2020-04-24 DIAGNOSIS — E1165 Type 2 diabetes mellitus with hyperglycemia: Secondary | ICD-10-CM | POA: Diagnosis not present

## 2020-04-24 DIAGNOSIS — M542 Cervicalgia: Secondary | ICD-10-CM | POA: Diagnosis not present

## 2020-04-24 DIAGNOSIS — Z9089 Acquired absence of other organs: Secondary | ICD-10-CM | POA: Diagnosis not present

## 2020-04-24 DIAGNOSIS — I119 Hypertensive heart disease without heart failure: Secondary | ICD-10-CM | POA: Diagnosis not present

## 2020-05-01 DIAGNOSIS — M150 Primary generalized (osteo)arthritis: Secondary | ICD-10-CM | POA: Diagnosis not present

## 2020-05-01 DIAGNOSIS — E785 Hyperlipidemia, unspecified: Secondary | ICD-10-CM | POA: Diagnosis not present

## 2020-05-01 DIAGNOSIS — Z7982 Long term (current) use of aspirin: Secondary | ICD-10-CM | POA: Diagnosis not present

## 2020-05-01 DIAGNOSIS — G7000 Myasthenia gravis without (acute) exacerbation: Secondary | ICD-10-CM | POA: Diagnosis not present

## 2020-05-01 DIAGNOSIS — G4733 Obstructive sleep apnea (adult) (pediatric): Secondary | ICD-10-CM | POA: Diagnosis not present

## 2020-05-01 DIAGNOSIS — I6523 Occlusion and stenosis of bilateral carotid arteries: Secondary | ICD-10-CM | POA: Diagnosis not present

## 2020-05-01 DIAGNOSIS — M542 Cervicalgia: Secondary | ICD-10-CM | POA: Diagnosis not present

## 2020-05-01 DIAGNOSIS — E890 Postprocedural hypothyroidism: Secondary | ICD-10-CM | POA: Diagnosis not present

## 2020-05-01 DIAGNOSIS — Z9089 Acquired absence of other organs: Secondary | ICD-10-CM | POA: Diagnosis not present

## 2020-05-01 DIAGNOSIS — J45909 Unspecified asthma, uncomplicated: Secondary | ICD-10-CM | POA: Diagnosis not present

## 2020-05-01 DIAGNOSIS — I69351 Hemiplegia and hemiparesis following cerebral infarction affecting right dominant side: Secondary | ICD-10-CM | POA: Diagnosis not present

## 2020-05-01 DIAGNOSIS — Z85850 Personal history of malignant neoplasm of thyroid: Secondary | ICD-10-CM | POA: Diagnosis not present

## 2020-05-01 DIAGNOSIS — Z794 Long term (current) use of insulin: Secondary | ICD-10-CM | POA: Diagnosis not present

## 2020-05-01 DIAGNOSIS — G894 Chronic pain syndrome: Secondary | ICD-10-CM | POA: Diagnosis not present

## 2020-05-01 DIAGNOSIS — E1165 Type 2 diabetes mellitus with hyperglycemia: Secondary | ICD-10-CM | POA: Diagnosis not present

## 2020-05-01 DIAGNOSIS — I119 Hypertensive heart disease without heart failure: Secondary | ICD-10-CM | POA: Diagnosis not present

## 2020-05-01 DIAGNOSIS — K219 Gastro-esophageal reflux disease without esophagitis: Secondary | ICD-10-CM | POA: Diagnosis not present

## 2020-05-07 DIAGNOSIS — M150 Primary generalized (osteo)arthritis: Secondary | ICD-10-CM | POA: Diagnosis not present

## 2020-05-07 DIAGNOSIS — K219 Gastro-esophageal reflux disease without esophagitis: Secondary | ICD-10-CM | POA: Diagnosis not present

## 2020-05-07 DIAGNOSIS — I119 Hypertensive heart disease without heart failure: Secondary | ICD-10-CM | POA: Diagnosis not present

## 2020-05-07 DIAGNOSIS — Z9089 Acquired absence of other organs: Secondary | ICD-10-CM | POA: Diagnosis not present

## 2020-05-07 DIAGNOSIS — Z794 Long term (current) use of insulin: Secondary | ICD-10-CM | POA: Diagnosis not present

## 2020-05-07 DIAGNOSIS — M542 Cervicalgia: Secondary | ICD-10-CM | POA: Diagnosis not present

## 2020-05-07 DIAGNOSIS — I6523 Occlusion and stenosis of bilateral carotid arteries: Secondary | ICD-10-CM | POA: Diagnosis not present

## 2020-05-07 DIAGNOSIS — G894 Chronic pain syndrome: Secondary | ICD-10-CM | POA: Diagnosis not present

## 2020-05-07 DIAGNOSIS — E785 Hyperlipidemia, unspecified: Secondary | ICD-10-CM | POA: Diagnosis not present

## 2020-05-07 DIAGNOSIS — G7000 Myasthenia gravis without (acute) exacerbation: Secondary | ICD-10-CM | POA: Diagnosis not present

## 2020-05-07 DIAGNOSIS — I69351 Hemiplegia and hemiparesis following cerebral infarction affecting right dominant side: Secondary | ICD-10-CM | POA: Diagnosis not present

## 2020-05-07 DIAGNOSIS — J45909 Unspecified asthma, uncomplicated: Secondary | ICD-10-CM | POA: Diagnosis not present

## 2020-05-07 DIAGNOSIS — E1165 Type 2 diabetes mellitus with hyperglycemia: Secondary | ICD-10-CM | POA: Diagnosis not present

## 2020-05-07 DIAGNOSIS — E890 Postprocedural hypothyroidism: Secondary | ICD-10-CM | POA: Diagnosis not present

## 2020-05-07 DIAGNOSIS — G4733 Obstructive sleep apnea (adult) (pediatric): Secondary | ICD-10-CM | POA: Diagnosis not present

## 2020-05-07 DIAGNOSIS — Z85850 Personal history of malignant neoplasm of thyroid: Secondary | ICD-10-CM | POA: Diagnosis not present

## 2020-05-07 DIAGNOSIS — Z7982 Long term (current) use of aspirin: Secondary | ICD-10-CM | POA: Diagnosis not present

## 2020-05-08 DIAGNOSIS — G4733 Obstructive sleep apnea (adult) (pediatric): Secondary | ICD-10-CM | POA: Diagnosis not present

## 2020-05-13 DIAGNOSIS — I119 Hypertensive heart disease without heart failure: Secondary | ICD-10-CM | POA: Diagnosis not present

## 2020-05-13 DIAGNOSIS — G894 Chronic pain syndrome: Secondary | ICD-10-CM | POA: Diagnosis not present

## 2020-05-13 DIAGNOSIS — Z7982 Long term (current) use of aspirin: Secondary | ICD-10-CM | POA: Diagnosis not present

## 2020-05-13 DIAGNOSIS — G4733 Obstructive sleep apnea (adult) (pediatric): Secondary | ICD-10-CM | POA: Diagnosis not present

## 2020-05-13 DIAGNOSIS — Z85850 Personal history of malignant neoplasm of thyroid: Secondary | ICD-10-CM | POA: Diagnosis not present

## 2020-05-13 DIAGNOSIS — M542 Cervicalgia: Secondary | ICD-10-CM | POA: Diagnosis not present

## 2020-05-13 DIAGNOSIS — Z9089 Acquired absence of other organs: Secondary | ICD-10-CM | POA: Diagnosis not present

## 2020-05-13 DIAGNOSIS — E890 Postprocedural hypothyroidism: Secondary | ICD-10-CM | POA: Diagnosis not present

## 2020-05-13 DIAGNOSIS — E1165 Type 2 diabetes mellitus with hyperglycemia: Secondary | ICD-10-CM | POA: Diagnosis not present

## 2020-05-13 DIAGNOSIS — E785 Hyperlipidemia, unspecified: Secondary | ICD-10-CM | POA: Diagnosis not present

## 2020-05-13 DIAGNOSIS — I69351 Hemiplegia and hemiparesis following cerebral infarction affecting right dominant side: Secondary | ICD-10-CM | POA: Diagnosis not present

## 2020-05-13 DIAGNOSIS — G7000 Myasthenia gravis without (acute) exacerbation: Secondary | ICD-10-CM | POA: Diagnosis not present

## 2020-05-13 DIAGNOSIS — M150 Primary generalized (osteo)arthritis: Secondary | ICD-10-CM | POA: Diagnosis not present

## 2020-05-13 DIAGNOSIS — I6523 Occlusion and stenosis of bilateral carotid arteries: Secondary | ICD-10-CM | POA: Diagnosis not present

## 2020-05-13 DIAGNOSIS — Z794 Long term (current) use of insulin: Secondary | ICD-10-CM | POA: Diagnosis not present

## 2020-05-13 DIAGNOSIS — K219 Gastro-esophageal reflux disease without esophagitis: Secondary | ICD-10-CM | POA: Diagnosis not present

## 2020-05-13 DIAGNOSIS — J45909 Unspecified asthma, uncomplicated: Secondary | ICD-10-CM | POA: Diagnosis not present

## 2020-05-14 ENCOUNTER — Telehealth: Payer: Self-pay | Admitting: *Deleted

## 2020-05-14 NOTE — Telephone Encounter (Signed)
[name] Kraft PT with [facility] called to get approval for POC 1wk9 recert.  Approval given.

## 2020-05-23 DIAGNOSIS — E119 Type 2 diabetes mellitus without complications: Secondary | ICD-10-CM | POA: Diagnosis not present

## 2020-05-23 DIAGNOSIS — Z794 Long term (current) use of insulin: Secondary | ICD-10-CM | POA: Diagnosis not present

## 2020-05-24 DIAGNOSIS — Z794 Long term (current) use of insulin: Secondary | ICD-10-CM | POA: Diagnosis not present

## 2020-05-24 DIAGNOSIS — M150 Primary generalized (osteo)arthritis: Secondary | ICD-10-CM | POA: Diagnosis not present

## 2020-05-24 DIAGNOSIS — J45909 Unspecified asthma, uncomplicated: Secondary | ICD-10-CM | POA: Diagnosis not present

## 2020-05-24 DIAGNOSIS — E890 Postprocedural hypothyroidism: Secondary | ICD-10-CM | POA: Diagnosis not present

## 2020-05-24 DIAGNOSIS — G894 Chronic pain syndrome: Secondary | ICD-10-CM | POA: Diagnosis not present

## 2020-05-24 DIAGNOSIS — M542 Cervicalgia: Secondary | ICD-10-CM | POA: Diagnosis not present

## 2020-05-24 DIAGNOSIS — I119 Hypertensive heart disease without heart failure: Secondary | ICD-10-CM | POA: Diagnosis not present

## 2020-05-24 DIAGNOSIS — Z9089 Acquired absence of other organs: Secondary | ICD-10-CM | POA: Diagnosis not present

## 2020-05-24 DIAGNOSIS — G7000 Myasthenia gravis without (acute) exacerbation: Secondary | ICD-10-CM | POA: Diagnosis not present

## 2020-05-24 DIAGNOSIS — Z85850 Personal history of malignant neoplasm of thyroid: Secondary | ICD-10-CM | POA: Diagnosis not present

## 2020-05-24 DIAGNOSIS — E785 Hyperlipidemia, unspecified: Secondary | ICD-10-CM | POA: Diagnosis not present

## 2020-05-24 DIAGNOSIS — G4733 Obstructive sleep apnea (adult) (pediatric): Secondary | ICD-10-CM | POA: Diagnosis not present

## 2020-05-24 DIAGNOSIS — E1165 Type 2 diabetes mellitus with hyperglycemia: Secondary | ICD-10-CM | POA: Diagnosis not present

## 2020-05-24 DIAGNOSIS — Z7982 Long term (current) use of aspirin: Secondary | ICD-10-CM | POA: Diagnosis not present

## 2020-05-24 DIAGNOSIS — I6523 Occlusion and stenosis of bilateral carotid arteries: Secondary | ICD-10-CM | POA: Diagnosis not present

## 2020-05-24 DIAGNOSIS — I69351 Hemiplegia and hemiparesis following cerebral infarction affecting right dominant side: Secondary | ICD-10-CM | POA: Diagnosis not present

## 2020-05-24 DIAGNOSIS — K219 Gastro-esophageal reflux disease without esophagitis: Secondary | ICD-10-CM | POA: Diagnosis not present

## 2020-05-27 DIAGNOSIS — D8989 Other specified disorders involving the immune mechanism, not elsewhere classified: Secondary | ICD-10-CM | POA: Diagnosis not present

## 2020-05-27 DIAGNOSIS — G7000 Myasthenia gravis without (acute) exacerbation: Secondary | ICD-10-CM | POA: Diagnosis not present

## 2020-05-29 DIAGNOSIS — K219 Gastro-esophageal reflux disease without esophagitis: Secondary | ICD-10-CM | POA: Diagnosis not present

## 2020-05-29 DIAGNOSIS — I69351 Hemiplegia and hemiparesis following cerebral infarction affecting right dominant side: Secondary | ICD-10-CM | POA: Diagnosis not present

## 2020-05-29 DIAGNOSIS — G4733 Obstructive sleep apnea (adult) (pediatric): Secondary | ICD-10-CM | POA: Diagnosis not present

## 2020-05-29 DIAGNOSIS — Z794 Long term (current) use of insulin: Secondary | ICD-10-CM | POA: Diagnosis not present

## 2020-05-29 DIAGNOSIS — M150 Primary generalized (osteo)arthritis: Secondary | ICD-10-CM | POA: Diagnosis not present

## 2020-05-29 DIAGNOSIS — M542 Cervicalgia: Secondary | ICD-10-CM | POA: Diagnosis not present

## 2020-05-29 DIAGNOSIS — Z85850 Personal history of malignant neoplasm of thyroid: Secondary | ICD-10-CM | POA: Diagnosis not present

## 2020-05-29 DIAGNOSIS — G894 Chronic pain syndrome: Secondary | ICD-10-CM | POA: Diagnosis not present

## 2020-05-29 DIAGNOSIS — E785 Hyperlipidemia, unspecified: Secondary | ICD-10-CM | POA: Diagnosis not present

## 2020-05-29 DIAGNOSIS — E1165 Type 2 diabetes mellitus with hyperglycemia: Secondary | ICD-10-CM | POA: Diagnosis not present

## 2020-05-29 DIAGNOSIS — I119 Hypertensive heart disease without heart failure: Secondary | ICD-10-CM | POA: Diagnosis not present

## 2020-05-29 DIAGNOSIS — J45909 Unspecified asthma, uncomplicated: Secondary | ICD-10-CM | POA: Diagnosis not present

## 2020-05-29 DIAGNOSIS — Z7982 Long term (current) use of aspirin: Secondary | ICD-10-CM | POA: Diagnosis not present

## 2020-05-29 DIAGNOSIS — G7000 Myasthenia gravis without (acute) exacerbation: Secondary | ICD-10-CM | POA: Diagnosis not present

## 2020-05-29 DIAGNOSIS — I6523 Occlusion and stenosis of bilateral carotid arteries: Secondary | ICD-10-CM | POA: Diagnosis not present

## 2020-05-29 DIAGNOSIS — Z9089 Acquired absence of other organs: Secondary | ICD-10-CM | POA: Diagnosis not present

## 2020-05-29 DIAGNOSIS — E890 Postprocedural hypothyroidism: Secondary | ICD-10-CM | POA: Diagnosis not present

## 2020-06-03 DIAGNOSIS — M150 Primary generalized (osteo)arthritis: Secondary | ICD-10-CM | POA: Diagnosis not present

## 2020-06-03 DIAGNOSIS — E890 Postprocedural hypothyroidism: Secondary | ICD-10-CM | POA: Diagnosis not present

## 2020-06-03 DIAGNOSIS — K219 Gastro-esophageal reflux disease without esophagitis: Secondary | ICD-10-CM | POA: Diagnosis not present

## 2020-06-03 DIAGNOSIS — I119 Hypertensive heart disease without heart failure: Secondary | ICD-10-CM | POA: Diagnosis not present

## 2020-06-03 DIAGNOSIS — G894 Chronic pain syndrome: Secondary | ICD-10-CM | POA: Diagnosis not present

## 2020-06-03 DIAGNOSIS — J45909 Unspecified asthma, uncomplicated: Secondary | ICD-10-CM | POA: Diagnosis not present

## 2020-06-03 DIAGNOSIS — I6523 Occlusion and stenosis of bilateral carotid arteries: Secondary | ICD-10-CM | POA: Diagnosis not present

## 2020-06-03 DIAGNOSIS — I69351 Hemiplegia and hemiparesis following cerebral infarction affecting right dominant side: Secondary | ICD-10-CM | POA: Diagnosis not present

## 2020-06-03 DIAGNOSIS — G4733 Obstructive sleep apnea (adult) (pediatric): Secondary | ICD-10-CM | POA: Diagnosis not present

## 2020-06-03 DIAGNOSIS — Z794 Long term (current) use of insulin: Secondary | ICD-10-CM | POA: Diagnosis not present

## 2020-06-03 DIAGNOSIS — E1165 Type 2 diabetes mellitus with hyperglycemia: Secondary | ICD-10-CM | POA: Diagnosis not present

## 2020-06-03 DIAGNOSIS — Z7982 Long term (current) use of aspirin: Secondary | ICD-10-CM | POA: Diagnosis not present

## 2020-06-03 DIAGNOSIS — Z85850 Personal history of malignant neoplasm of thyroid: Secondary | ICD-10-CM | POA: Diagnosis not present

## 2020-06-03 DIAGNOSIS — E785 Hyperlipidemia, unspecified: Secondary | ICD-10-CM | POA: Diagnosis not present

## 2020-06-03 DIAGNOSIS — Z9089 Acquired absence of other organs: Secondary | ICD-10-CM | POA: Diagnosis not present

## 2020-06-03 DIAGNOSIS — M542 Cervicalgia: Secondary | ICD-10-CM | POA: Diagnosis not present

## 2020-06-03 DIAGNOSIS — G7000 Myasthenia gravis without (acute) exacerbation: Secondary | ICD-10-CM | POA: Diagnosis not present

## 2020-06-10 DIAGNOSIS — G7000 Myasthenia gravis without (acute) exacerbation: Secondary | ICD-10-CM | POA: Diagnosis not present

## 2020-06-10 DIAGNOSIS — D8989 Other specified disorders involving the immune mechanism, not elsewhere classified: Secondary | ICD-10-CM | POA: Diagnosis not present

## 2020-06-12 DIAGNOSIS — G7000 Myasthenia gravis without (acute) exacerbation: Secondary | ICD-10-CM | POA: Diagnosis not present

## 2020-06-12 DIAGNOSIS — Z9089 Acquired absence of other organs: Secondary | ICD-10-CM | POA: Diagnosis not present

## 2020-06-12 DIAGNOSIS — J45909 Unspecified asthma, uncomplicated: Secondary | ICD-10-CM | POA: Diagnosis not present

## 2020-06-12 DIAGNOSIS — E890 Postprocedural hypothyroidism: Secondary | ICD-10-CM | POA: Diagnosis not present

## 2020-06-12 DIAGNOSIS — M150 Primary generalized (osteo)arthritis: Secondary | ICD-10-CM | POA: Diagnosis not present

## 2020-06-12 DIAGNOSIS — E785 Hyperlipidemia, unspecified: Secondary | ICD-10-CM | POA: Diagnosis not present

## 2020-06-12 DIAGNOSIS — I69351 Hemiplegia and hemiparesis following cerebral infarction affecting right dominant side: Secondary | ICD-10-CM | POA: Diagnosis not present

## 2020-06-12 DIAGNOSIS — G894 Chronic pain syndrome: Secondary | ICD-10-CM | POA: Diagnosis not present

## 2020-06-12 DIAGNOSIS — G4733 Obstructive sleep apnea (adult) (pediatric): Secondary | ICD-10-CM | POA: Diagnosis not present

## 2020-06-12 DIAGNOSIS — E1165 Type 2 diabetes mellitus with hyperglycemia: Secondary | ICD-10-CM | POA: Diagnosis not present

## 2020-06-12 DIAGNOSIS — I6523 Occlusion and stenosis of bilateral carotid arteries: Secondary | ICD-10-CM | POA: Diagnosis not present

## 2020-06-12 DIAGNOSIS — Z794 Long term (current) use of insulin: Secondary | ICD-10-CM | POA: Diagnosis not present

## 2020-06-12 DIAGNOSIS — M542 Cervicalgia: Secondary | ICD-10-CM | POA: Diagnosis not present

## 2020-06-12 DIAGNOSIS — I119 Hypertensive heart disease without heart failure: Secondary | ICD-10-CM | POA: Diagnosis not present

## 2020-06-12 DIAGNOSIS — Z85850 Personal history of malignant neoplasm of thyroid: Secondary | ICD-10-CM | POA: Diagnosis not present

## 2020-06-12 DIAGNOSIS — K219 Gastro-esophageal reflux disease without esophagitis: Secondary | ICD-10-CM | POA: Diagnosis not present

## 2020-06-12 DIAGNOSIS — Z7982 Long term (current) use of aspirin: Secondary | ICD-10-CM | POA: Diagnosis not present

## 2020-06-17 DIAGNOSIS — G4733 Obstructive sleep apnea (adult) (pediatric): Secondary | ICD-10-CM | POA: Diagnosis not present

## 2020-06-17 DIAGNOSIS — I6523 Occlusion and stenosis of bilateral carotid arteries: Secondary | ICD-10-CM | POA: Diagnosis not present

## 2020-06-17 DIAGNOSIS — I119 Hypertensive heart disease without heart failure: Secondary | ICD-10-CM | POA: Diagnosis not present

## 2020-06-17 DIAGNOSIS — G7000 Myasthenia gravis without (acute) exacerbation: Secondary | ICD-10-CM | POA: Diagnosis not present

## 2020-06-17 DIAGNOSIS — Z85850 Personal history of malignant neoplasm of thyroid: Secondary | ICD-10-CM | POA: Diagnosis not present

## 2020-06-17 DIAGNOSIS — J45909 Unspecified asthma, uncomplicated: Secondary | ICD-10-CM | POA: Diagnosis not present

## 2020-06-17 DIAGNOSIS — I69351 Hemiplegia and hemiparesis following cerebral infarction affecting right dominant side: Secondary | ICD-10-CM | POA: Diagnosis not present

## 2020-06-17 DIAGNOSIS — K219 Gastro-esophageal reflux disease without esophagitis: Secondary | ICD-10-CM | POA: Diagnosis not present

## 2020-06-17 DIAGNOSIS — E890 Postprocedural hypothyroidism: Secondary | ICD-10-CM | POA: Diagnosis not present

## 2020-06-17 DIAGNOSIS — E785 Hyperlipidemia, unspecified: Secondary | ICD-10-CM | POA: Diagnosis not present

## 2020-06-17 DIAGNOSIS — E1165 Type 2 diabetes mellitus with hyperglycemia: Secondary | ICD-10-CM | POA: Diagnosis not present

## 2020-06-17 DIAGNOSIS — Z7982 Long term (current) use of aspirin: Secondary | ICD-10-CM | POA: Diagnosis not present

## 2020-06-17 DIAGNOSIS — Z9089 Acquired absence of other organs: Secondary | ICD-10-CM | POA: Diagnosis not present

## 2020-06-17 DIAGNOSIS — M542 Cervicalgia: Secondary | ICD-10-CM | POA: Diagnosis not present

## 2020-06-17 DIAGNOSIS — M150 Primary generalized (osteo)arthritis: Secondary | ICD-10-CM | POA: Diagnosis not present

## 2020-06-17 DIAGNOSIS — Z794 Long term (current) use of insulin: Secondary | ICD-10-CM | POA: Diagnosis not present

## 2020-06-17 DIAGNOSIS — G894 Chronic pain syndrome: Secondary | ICD-10-CM | POA: Diagnosis not present

## 2020-06-20 DIAGNOSIS — G4733 Obstructive sleep apnea (adult) (pediatric): Secondary | ICD-10-CM | POA: Diagnosis not present

## 2020-06-21 DIAGNOSIS — E113293 Type 2 diabetes mellitus with mild nonproliferative diabetic retinopathy without macular edema, bilateral: Secondary | ICD-10-CM | POA: Diagnosis not present

## 2020-06-21 DIAGNOSIS — H3500 Unspecified background retinopathy: Secondary | ICD-10-CM | POA: Diagnosis not present

## 2020-06-21 DIAGNOSIS — Z79899 Other long term (current) drug therapy: Secondary | ICD-10-CM | POA: Diagnosis not present

## 2020-06-21 DIAGNOSIS — H469 Unspecified optic neuritis: Secondary | ICD-10-CM | POA: Diagnosis not present

## 2020-06-22 DIAGNOSIS — Z794 Long term (current) use of insulin: Secondary | ICD-10-CM | POA: Diagnosis not present

## 2020-06-22 DIAGNOSIS — E119 Type 2 diabetes mellitus without complications: Secondary | ICD-10-CM | POA: Diagnosis not present

## 2020-06-24 DIAGNOSIS — I69351 Hemiplegia and hemiparesis following cerebral infarction affecting right dominant side: Secondary | ICD-10-CM | POA: Diagnosis not present

## 2020-06-24 DIAGNOSIS — Z85850 Personal history of malignant neoplasm of thyroid: Secondary | ICD-10-CM | POA: Diagnosis not present

## 2020-06-24 DIAGNOSIS — G894 Chronic pain syndrome: Secondary | ICD-10-CM | POA: Diagnosis not present

## 2020-06-24 DIAGNOSIS — K219 Gastro-esophageal reflux disease without esophagitis: Secondary | ICD-10-CM | POA: Diagnosis not present

## 2020-06-24 DIAGNOSIS — G4733 Obstructive sleep apnea (adult) (pediatric): Secondary | ICD-10-CM | POA: Diagnosis not present

## 2020-06-24 DIAGNOSIS — J45909 Unspecified asthma, uncomplicated: Secondary | ICD-10-CM | POA: Diagnosis not present

## 2020-06-24 DIAGNOSIS — Z9089 Acquired absence of other organs: Secondary | ICD-10-CM | POA: Diagnosis not present

## 2020-06-24 DIAGNOSIS — Z7982 Long term (current) use of aspirin: Secondary | ICD-10-CM | POA: Diagnosis not present

## 2020-06-24 DIAGNOSIS — Z794 Long term (current) use of insulin: Secondary | ICD-10-CM | POA: Diagnosis not present

## 2020-06-24 DIAGNOSIS — E1165 Type 2 diabetes mellitus with hyperglycemia: Secondary | ICD-10-CM | POA: Diagnosis not present

## 2020-06-24 DIAGNOSIS — G7000 Myasthenia gravis without (acute) exacerbation: Secondary | ICD-10-CM | POA: Diagnosis not present

## 2020-06-24 DIAGNOSIS — M542 Cervicalgia: Secondary | ICD-10-CM | POA: Diagnosis not present

## 2020-06-24 DIAGNOSIS — I6523 Occlusion and stenosis of bilateral carotid arteries: Secondary | ICD-10-CM | POA: Diagnosis not present

## 2020-06-24 DIAGNOSIS — I119 Hypertensive heart disease without heart failure: Secondary | ICD-10-CM | POA: Diagnosis not present

## 2020-06-24 DIAGNOSIS — E785 Hyperlipidemia, unspecified: Secondary | ICD-10-CM | POA: Diagnosis not present

## 2020-06-24 DIAGNOSIS — E890 Postprocedural hypothyroidism: Secondary | ICD-10-CM | POA: Diagnosis not present

## 2020-06-24 DIAGNOSIS — M150 Primary generalized (osteo)arthritis: Secondary | ICD-10-CM | POA: Diagnosis not present

## 2020-06-25 ENCOUNTER — Encounter: Payer: Self-pay | Admitting: Physical Medicine & Rehabilitation

## 2020-06-25 ENCOUNTER — Encounter: Payer: Medicare Other | Attending: Physical Medicine & Rehabilitation | Admitting: Physical Medicine & Rehabilitation

## 2020-06-25 ENCOUNTER — Other Ambulatory Visit: Payer: Self-pay

## 2020-06-25 VITALS — BP 173/93 | HR 98 | Temp 98.6°F | Ht 63.0 in | Wt 200.0 lb

## 2020-06-25 DIAGNOSIS — I6381 Other cerebral infarction due to occlusion or stenosis of small artery: Secondary | ICD-10-CM

## 2020-06-25 DIAGNOSIS — I69398 Other sequelae of cerebral infarction: Secondary | ICD-10-CM | POA: Insufficient documentation

## 2020-06-25 DIAGNOSIS — R269 Unspecified abnormalities of gait and mobility: Secondary | ICD-10-CM | POA: Insufficient documentation

## 2020-06-25 DIAGNOSIS — E1165 Type 2 diabetes mellitus with hyperglycemia: Secondary | ICD-10-CM | POA: Diagnosis not present

## 2020-06-25 DIAGNOSIS — I10 Essential (primary) hypertension: Secondary | ICD-10-CM | POA: Diagnosis not present

## 2020-06-25 DIAGNOSIS — I639 Cerebral infarction, unspecified: Secondary | ICD-10-CM | POA: Diagnosis not present

## 2020-06-25 DIAGNOSIS — E559 Vitamin D deficiency, unspecified: Secondary | ICD-10-CM | POA: Diagnosis not present

## 2020-06-25 DIAGNOSIS — C73 Malignant neoplasm of thyroid gland: Secondary | ICD-10-CM | POA: Diagnosis not present
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
## 2020-06-25 NOTE — Patient Instructions (Addendum)
Lidocaine patch over the counter to the Right wrist 12 hours per day

## 2020-06-27 ENCOUNTER — Ambulatory Visit: Payer: Medicare Other | Admitting: Physical Medicine & Rehabilitation

## 2020-07-03 DIAGNOSIS — G4733 Obstructive sleep apnea (adult) (pediatric): Secondary | ICD-10-CM | POA: Diagnosis not present

## 2020-07-03 DIAGNOSIS — G7000 Myasthenia gravis without (acute) exacerbation: Secondary | ICD-10-CM | POA: Diagnosis not present

## 2020-07-03 DIAGNOSIS — Z9089 Acquired absence of other organs: Secondary | ICD-10-CM | POA: Diagnosis not present

## 2020-07-03 DIAGNOSIS — Z794 Long term (current) use of insulin: Secondary | ICD-10-CM | POA: Diagnosis not present

## 2020-07-03 DIAGNOSIS — I6523 Occlusion and stenosis of bilateral carotid arteries: Secondary | ICD-10-CM | POA: Diagnosis not present

## 2020-07-03 DIAGNOSIS — G894 Chronic pain syndrome: Secondary | ICD-10-CM | POA: Diagnosis not present

## 2020-07-03 DIAGNOSIS — E785 Hyperlipidemia, unspecified: Secondary | ICD-10-CM | POA: Diagnosis not present

## 2020-07-03 DIAGNOSIS — M542 Cervicalgia: Secondary | ICD-10-CM | POA: Diagnosis not present

## 2020-07-03 DIAGNOSIS — Z7982 Long term (current) use of aspirin: Secondary | ICD-10-CM | POA: Diagnosis not present

## 2020-07-03 DIAGNOSIS — I119 Hypertensive heart disease without heart failure: Secondary | ICD-10-CM | POA: Diagnosis not present

## 2020-07-03 DIAGNOSIS — J45909 Unspecified asthma, uncomplicated: Secondary | ICD-10-CM | POA: Diagnosis not present

## 2020-07-03 DIAGNOSIS — Z85850 Personal history of malignant neoplasm of thyroid: Secondary | ICD-10-CM | POA: Diagnosis not present

## 2020-07-03 DIAGNOSIS — M150 Primary generalized (osteo)arthritis: Secondary | ICD-10-CM | POA: Diagnosis not present

## 2020-07-03 DIAGNOSIS — I69351 Hemiplegia and hemiparesis following cerebral infarction affecting right dominant side: Secondary | ICD-10-CM | POA: Diagnosis not present

## 2020-07-03 DIAGNOSIS — E890 Postprocedural hypothyroidism: Secondary | ICD-10-CM | POA: Diagnosis not present

## 2020-07-03 DIAGNOSIS — E1165 Type 2 diabetes mellitus with hyperglycemia: Secondary | ICD-10-CM | POA: Diagnosis not present

## 2020-07-03 DIAGNOSIS — K219 Gastro-esophageal reflux disease without esophagitis: Secondary | ICD-10-CM | POA: Diagnosis not present

## 2020-07-09 DIAGNOSIS — Z85850 Personal history of malignant neoplasm of thyroid: Secondary | ICD-10-CM | POA: Diagnosis not present

## 2020-07-09 DIAGNOSIS — Z9089 Acquired absence of other organs: Secondary | ICD-10-CM | POA: Diagnosis not present

## 2020-07-09 DIAGNOSIS — G894 Chronic pain syndrome: Secondary | ICD-10-CM | POA: Diagnosis not present

## 2020-07-09 DIAGNOSIS — K219 Gastro-esophageal reflux disease without esophagitis: Secondary | ICD-10-CM | POA: Diagnosis not present

## 2020-07-09 DIAGNOSIS — M542 Cervicalgia: Secondary | ICD-10-CM | POA: Diagnosis not present

## 2020-07-09 DIAGNOSIS — I6523 Occlusion and stenosis of bilateral carotid arteries: Secondary | ICD-10-CM | POA: Diagnosis not present

## 2020-07-09 DIAGNOSIS — E785 Hyperlipidemia, unspecified: Secondary | ICD-10-CM | POA: Diagnosis not present

## 2020-07-09 DIAGNOSIS — E890 Postprocedural hypothyroidism: Secondary | ICD-10-CM | POA: Diagnosis not present

## 2020-07-09 DIAGNOSIS — I119 Hypertensive heart disease without heart failure: Secondary | ICD-10-CM | POA: Diagnosis not present

## 2020-07-09 DIAGNOSIS — Z794 Long term (current) use of insulin: Secondary | ICD-10-CM | POA: Diagnosis not present

## 2020-07-09 DIAGNOSIS — Z7982 Long term (current) use of aspirin: Secondary | ICD-10-CM | POA: Diagnosis not present

## 2020-07-09 DIAGNOSIS — J45909 Unspecified asthma, uncomplicated: Secondary | ICD-10-CM | POA: Diagnosis not present

## 2020-07-09 DIAGNOSIS — G4733 Obstructive sleep apnea (adult) (pediatric): Secondary | ICD-10-CM | POA: Diagnosis not present

## 2020-07-09 DIAGNOSIS — I69351 Hemiplegia and hemiparesis following cerebral infarction affecting right dominant side: Secondary | ICD-10-CM | POA: Diagnosis not present

## 2020-07-09 DIAGNOSIS — G7000 Myasthenia gravis without (acute) exacerbation: Secondary | ICD-10-CM | POA: Diagnosis not present

## 2020-07-09 DIAGNOSIS — M150 Primary generalized (osteo)arthritis: Secondary | ICD-10-CM | POA: Diagnosis not present

## 2020-07-09 DIAGNOSIS — E1165 Type 2 diabetes mellitus with hyperglycemia: Secondary | ICD-10-CM | POA: Diagnosis not present

## 2020-07-12 DIAGNOSIS — I69393 Ataxia following cerebral infarction: Secondary | ICD-10-CM | POA: Diagnosis not present

## 2020-07-12 DIAGNOSIS — I6930 Unspecified sequelae of cerebral infarction: Secondary | ICD-10-CM | POA: Diagnosis not present

## 2020-07-12 DIAGNOSIS — Z0000 Encounter for general adult medical examination without abnormal findings: Secondary | ICD-10-CM | POA: Diagnosis not present

## 2020-07-12 DIAGNOSIS — L731 Pseudofolliculitis barbae: Secondary | ICD-10-CM | POA: Diagnosis not present

## 2020-07-12 DIAGNOSIS — M25531 Pain in right wrist: Secondary | ICD-10-CM | POA: Diagnosis not present

## 2020-07-15 DIAGNOSIS — Z7982 Long term (current) use of aspirin: Secondary | ICD-10-CM | POA: Diagnosis not present

## 2020-07-15 DIAGNOSIS — J45909 Unspecified asthma, uncomplicated: Secondary | ICD-10-CM | POA: Diagnosis not present

## 2020-07-15 DIAGNOSIS — I69351 Hemiplegia and hemiparesis following cerebral infarction affecting right dominant side: Secondary | ICD-10-CM | POA: Diagnosis not present

## 2020-07-15 DIAGNOSIS — I119 Hypertensive heart disease without heart failure: Secondary | ICD-10-CM | POA: Diagnosis not present

## 2020-07-15 DIAGNOSIS — G7000 Myasthenia gravis without (acute) exacerbation: Secondary | ICD-10-CM | POA: Diagnosis not present

## 2020-07-15 DIAGNOSIS — I6523 Occlusion and stenosis of bilateral carotid arteries: Secondary | ICD-10-CM | POA: Diagnosis not present

## 2020-07-15 DIAGNOSIS — M150 Primary generalized (osteo)arthritis: Secondary | ICD-10-CM | POA: Diagnosis not present

## 2020-07-15 DIAGNOSIS — E785 Hyperlipidemia, unspecified: Secondary | ICD-10-CM | POA: Diagnosis not present

## 2020-07-15 DIAGNOSIS — Z9089 Acquired absence of other organs: Secondary | ICD-10-CM | POA: Diagnosis not present

## 2020-07-15 DIAGNOSIS — Z794 Long term (current) use of insulin: Secondary | ICD-10-CM | POA: Diagnosis not present

## 2020-07-15 DIAGNOSIS — M542 Cervicalgia: Secondary | ICD-10-CM | POA: Diagnosis not present

## 2020-07-15 DIAGNOSIS — K219 Gastro-esophageal reflux disease without esophagitis: Secondary | ICD-10-CM | POA: Diagnosis not present

## 2020-07-15 DIAGNOSIS — Z85850 Personal history of malignant neoplasm of thyroid: Secondary | ICD-10-CM | POA: Diagnosis not present

## 2020-07-15 DIAGNOSIS — E890 Postprocedural hypothyroidism: Secondary | ICD-10-CM | POA: Diagnosis not present

## 2020-07-15 DIAGNOSIS — G4733 Obstructive sleep apnea (adult) (pediatric): Secondary | ICD-10-CM | POA: Diagnosis not present

## 2020-07-15 DIAGNOSIS — G894 Chronic pain syndrome: Secondary | ICD-10-CM | POA: Diagnosis not present

## 2020-07-15 DIAGNOSIS — E1165 Type 2 diabetes mellitus with hyperglycemia: Secondary | ICD-10-CM | POA: Diagnosis not present

## 2020-07-23 DIAGNOSIS — E119 Type 2 diabetes mellitus without complications: Secondary | ICD-10-CM | POA: Diagnosis not present

## 2020-07-23 DIAGNOSIS — Z794 Long term (current) use of insulin: Secondary | ICD-10-CM | POA: Diagnosis not present

## 2020-07-29 DIAGNOSIS — E890 Postprocedural hypothyroidism: Secondary | ICD-10-CM | POA: Diagnosis not present

## 2020-07-29 DIAGNOSIS — Z9641 Presence of insulin pump (external) (internal): Secondary | ICD-10-CM | POA: Diagnosis not present

## 2020-07-29 DIAGNOSIS — E559 Vitamin D deficiency, unspecified: Secondary | ICD-10-CM | POA: Diagnosis not present

## 2020-07-29 DIAGNOSIS — I10 Essential (primary) hypertension: Secondary | ICD-10-CM | POA: Diagnosis not present

## 2020-07-29 DIAGNOSIS — E1165 Type 2 diabetes mellitus with hyperglycemia: Secondary | ICD-10-CM | POA: Diagnosis not present

## 2020-07-29 DIAGNOSIS — R609 Edema, unspecified: Secondary | ICD-10-CM | POA: Diagnosis not present

## 2020-08-02 ENCOUNTER — Encounter: Payer: Medicare Other | Attending: Physical Medicine & Rehabilitation | Admitting: Physical Medicine & Rehabilitation

## 2020-08-02 ENCOUNTER — Other Ambulatory Visit: Payer: Self-pay

## 2020-08-02 ENCOUNTER — Encounter: Payer: Self-pay | Admitting: Physical Medicine & Rehabilitation

## 2020-08-02 VITALS — BP 143/80 | HR 92 | Temp 98.4°F | Ht 63.0 in | Wt 195.4 lb

## 2020-08-02 DIAGNOSIS — I639 Cerebral infarction, unspecified: Secondary | ICD-10-CM | POA: Insufficient documentation

## 2020-08-02 DIAGNOSIS — I6381 Other cerebral infarction due to occlusion or stenosis of small artery: Secondary | ICD-10-CM

## 2020-08-02 MED ORDER — GABAPENTIN 100 MG PO CAPS: 100.0000 mg | ORAL_CAPSULE | Freq: Three times a day (TID) | ORAL | 2 refills | Status: DC
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
## 2020-08-06 ENCOUNTER — Ambulatory Visit: Payer: Medicare Other | Admitting: Physical Medicine & Rehabilitation

## 2020-08-07 ENCOUNTER — Telehealth: Payer: Self-pay

## 2020-08-07 NOTE — Telephone Encounter (Signed)
[name] c/o having headaches with sharp pain, all over her head. Per patient her BP has been good. The only other discomfort is tingling in her hand.  She has taken OTC pain relief medicine. And still has a pain level of  6 out of 10 pain.  Patient advised to call her PCP. Or go to the Urgent Care, or ED. Dr. Letta Pate currently is out the office.

## 2020-08-08 DIAGNOSIS — G7000 Myasthenia gravis without (acute) exacerbation: Secondary | ICD-10-CM | POA: Diagnosis not present

## 2020-08-08 DIAGNOSIS — H3500 Unspecified background retinopathy: Secondary | ICD-10-CM | POA: Diagnosis not present

## 2020-08-08 DIAGNOSIS — E113313 Type 2 diabetes mellitus with moderate nonproliferative diabetic retinopathy with macular edema, bilateral: Secondary | ICD-10-CM | POA: Diagnosis not present

## 2020-08-08 DIAGNOSIS — H469 Unspecified optic neuritis: Secondary | ICD-10-CM | POA: Diagnosis not present

## 2020-08-09 DIAGNOSIS — G7000 Myasthenia gravis without (acute) exacerbation: Secondary | ICD-10-CM | POA: Diagnosis not present

## 2020-08-12 DIAGNOSIS — E1165 Type 2 diabetes mellitus with hyperglycemia: Secondary | ICD-10-CM | POA: Diagnosis not present

## 2020-08-20 DIAGNOSIS — G4733 Obstructive sleep apnea (adult) (pediatric): Secondary | ICD-10-CM | POA: Diagnosis not present

## 2020-08-22 DIAGNOSIS — E119 Type 2 diabetes mellitus without complications: Secondary | ICD-10-CM | POA: Diagnosis not present

## 2020-08-22 DIAGNOSIS — M79641 Pain in right hand: Secondary | ICD-10-CM | POA: Diagnosis not present

## 2020-08-22 DIAGNOSIS — Z794 Long term (current) use of insulin: Secondary | ICD-10-CM | POA: Diagnosis not present

## 2020-08-22 DIAGNOSIS — M25531 Pain in right wrist: Secondary | ICD-10-CM | POA: Diagnosis not present

## 2020-09-05 DIAGNOSIS — M25531 Pain in right wrist: Secondary | ICD-10-CM | POA: Diagnosis not present

## 2020-09-13 DIAGNOSIS — M67831 Other specified disorders of synovium, right wrist: Secondary | ICD-10-CM | POA: Diagnosis not present

## 2020-09-17 DIAGNOSIS — E1165 Type 2 diabetes mellitus with hyperglycemia: Secondary | ICD-10-CM | POA: Diagnosis not present

## 2020-09-17 DIAGNOSIS — C73 Malignant neoplasm of thyroid gland: Secondary | ICD-10-CM | POA: Diagnosis not present

## 2020-09-17 DIAGNOSIS — E890 Postprocedural hypothyroidism: Secondary | ICD-10-CM | POA: Diagnosis not present

## 2020-09-22 DIAGNOSIS — Z794 Long term (current) use of insulin: Secondary | ICD-10-CM | POA: Diagnosis not present

## 2020-09-22 DIAGNOSIS — E119 Type 2 diabetes mellitus without complications: Secondary | ICD-10-CM | POA: Diagnosis not present

## 2020-10-18 DIAGNOSIS — H3500 Unspecified background retinopathy: Secondary | ICD-10-CM | POA: Diagnosis not present

## 2020-10-18 DIAGNOSIS — G7000 Myasthenia gravis without (acute) exacerbation: Secondary | ICD-10-CM | POA: Diagnosis not present

## 2020-10-21 DIAGNOSIS — E119 Type 2 diabetes mellitus without complications: Secondary | ICD-10-CM | POA: Diagnosis not present

## 2020-10-21 DIAGNOSIS — Z794 Long term (current) use of insulin: Secondary | ICD-10-CM | POA: Diagnosis not present

## 2020-10-22 ENCOUNTER — Encounter: Payer: Medicare Other | Attending: Physical Medicine & Rehabilitation | Admitting: Physical Medicine & Rehabilitation

## 2020-10-22 ENCOUNTER — Other Ambulatory Visit: Payer: Self-pay

## 2020-10-22 ENCOUNTER — Encounter: Payer: Self-pay | Admitting: Physical Medicine & Rehabilitation

## 2020-10-22 VITALS — BP 139/78 | HR 101 | Temp 98.5°F | Ht 63.0 in | Wt 199.0 lb

## 2020-10-22 DIAGNOSIS — I639 Cerebral infarction, unspecified: Secondary | ICD-10-CM | POA: Insufficient documentation

## 2020-10-22 DIAGNOSIS — I6381 Other cerebral infarction due to occlusion or stenosis of small artery: Secondary | ICD-10-CM

## 2020-10-22 MED ORDER — GABAPENTIN 300 MG PO CAPS: 300.0000 mg | ORAL_CAPSULE | Freq: Three times a day (TID) | ORAL | 1 refills | Status: DC

## 2020-10-22 NOTE — Progress Notes (Signed)
Subjective:    Patient ID: [name], female    DOB: [birth year], 70 y.o.   MRN: [id number] 70 y.o. right-handed female with history of diabetes mellitus, ACDF 2014, chronic pain syndrome, myasthenia gravis maintained on CellCept as well as chronic prednisone receives IVIG at [facility], asthma with OSA, thyroid cancer, hypertension, hyperlipidemia.  She lives alone independent prior to admission.  She does have family in the area that work.  Presented 12/20/2019 with right side weakness.  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  Nuclear medicine perfusion scan no embolus.  Patient with recent carotid Dopplers noted right ICA greater than 70% left ICA 50 to 69%.  Echocardiogram with ejection fraction of 44% grade 1 diastolic dysfunction.  Admission chemistries potassium 3.3 troponin negative BNP 215, maintained on aspirin and Plavix X 3 WK then ASA alone for CVA prophylaxis.  Subcutaneous Lovenox for DVT prophylaxis.  Vascular surgery follow-up in regards to carotid stenosis no plan for surgical intervention follow-up outpatient  Admit date: 12/27/2019 Discharge date: 01/09/2020 HPI 70 year old female with history of left thalamic infarct, 12/20/2019. Chief complaint today is increased numbness and pain right side of face as well as right upper extremity.  The patient does not feel like she has lost any strength on the right side.  She remains modified independent for all self-care and mobility.  She has had no new swallowing problems no new vision problems.  The increased numbness started about a week ago and is not progressing over time.  She thinks maybe it feels a little bit better but not significantly so. The patient has a very complicated medical history with myasthenia gravis managed at [facility] Department of neurology.  The patient indicates that her myasthenia gravis has been well controlled with no significant flareups recently.  Her main symptoms are  droopy eyelids.  Pain Inventory Average Pain 5 Pain Right Now 7 My pain is intermittent, sharp, aching and numbness  In the last 24 hours, has pain interfered with the following? General activity 0 Relation with others 0 Enjoyment of life 0 What TIME of day is your pain at its worst? morning , daytime, evening, night and varies Sleep (in general) Fair  Pain is worse with: walking Pain improves with: rest and medication Relief from Meds: 3  Family History  Problem Relation Age of Onset  . Coronary artery disease Sister        s/p coronary stenting  . Stroke Sister 87  . Diabetes Sister   . Congestive Heart Failure Mother   . Asthma Mother   . Diabetes Mother   . Congestive Heart Failure Father   . Lung cancer Father   . Asthma Father   . Diabetes Father   . Diabetes Brother    Social History   Socioeconomic History  . Marital status: Single    Spouse name: Not on file  . Number of children: 0  . Years of education: college  . Highest education level: Not on file  Occupational History  . Occupation: retired  Tobacco Use  . Smoking status: Never Smoker  . Smokeless tobacco: Never Used  Vaping Use  . Vaping Use: Never used  Substance and Sexual Activity  . Alcohol use: No    Alcohol/week: 0.0 standard drinks  . Drug use: No  . Sexual activity: Never  Other Topics Concern  . Not on file  Social History Narrative   Caffeine none.   Social Determinants of  Health   Financial Resource Strain: Not on file  Food Insecurity: Not on file  Transportation Needs: Not on file  Physical Activity: Not on file  Stress: Not on file  Social Connections: Not on file   Past Surgical History:  Procedure Laterality Date  . ABDOMINAL HYSTERECTOMY    . ANTERIOR CERVICAL DECOMP/DISCECTOMY FUSION     "I've had severa ORs; always went in from the front" (03/13/2013)  . APPENDECTOMY    . CARDIAC CATHETERIZATION     "several" (03/13/2013)  . CARPAL TUNNEL RELEASE Right   .  CATARACT EXTRACTION W/ INTRAOCULAR LENS  IMPLANT, BILATERAL Bilateral   . CHOLECYSTECTOMY    . KNEE ARTHROSCOPY Left   . SHOULDER ARTHROSCOPY W/ ROTATOR CUFF REPAIR Left   . TONSILLECTOMY    . TOTAL THYROIDECTOMY     Past Surgical History:  Procedure Laterality Date  . ABDOMINAL HYSTERECTOMY    . ANTERIOR CERVICAL DECOMP/DISCECTOMY FUSION     "I've had severa ORs; always went in from the front" (03/13/2013)  . APPENDECTOMY    . CARDIAC CATHETERIZATION     "several" (03/13/2013)  . CARPAL TUNNEL RELEASE Right   . CATARACT EXTRACTION W/ INTRAOCULAR LENS  IMPLANT, BILATERAL Bilateral   . CHOLECYSTECTOMY    . KNEE ARTHROSCOPY Left   . SHOULDER ARTHROSCOPY W/ ROTATOR CUFF REPAIR Left   . TONSILLECTOMY    . TOTAL THYROIDECTOMY     Past Medical History:  Diagnosis Date  . Arthritis    "all over my body" (03/13/2013)  . Asthma   . Asymptomatic carotid artery stenosis, bilateral 10/08/2018  . GERD (gastroesophageal reflux disease)   . H/O hiatal hernia   . Heart murmur   . Hypercholesterolemia 10/08/2018  . Hypertension   . Hypothyroidism   . Myasthenia gravis ([place])    "in my eyes; diagnsosed > 7 yr ago" (03/13/2013)  . Sleep apnea    on CPAP  . Thyroid carcinoma ([place])   . Type II diabetes mellitus ([place])    There were no vitals taken for this visit.  Opioid Risk Score:   Fall Risk Score:  `1  Depression screen PHQ 2/9  Depression screen [facility] 2/9 08/02/2020 03/22/2020 02/22/2020 01/17/2020  Decreased Interest 0 0 0 1  Down, Depressed, Hopeless 0 0 0 0  PHQ - 2 Score 0 0 0 1  Altered sleeping - - - 0  Tired, decreased energy - - - 1  Change in appetite - - - 0  Feeling bad or failure about yourself  - - - 0  Trouble concentrating - - - 0  Moving slowly or fidgety/restless - - - 0  Suicidal thoughts - - - 0  PHQ-9 Score - - - 2  Difficult doing work/chores - - - Not difficult at all   Review of Systems  Musculoskeletal: Positive for gait problem.  Neurological: Positive for  numbness and headaches.       Numbness on upper right side of body  All other systems reviewed and are negative.      Objective:   Physical Exam Vitals and nursing note reviewed.  Constitutional:      Appearance: She is obese.  HENT:     Head: Normocephalic and atraumatic.  Eyes:     Extraocular Movements: Extraocular movements intact.     Conjunctiva/sclera: Conjunctivae normal.     Pupils: Pupils are equal, round, and reactive to light.  Musculoskeletal:        General: No swelling, tenderness or  deformity.     Right lower leg: No edema.     Left lower leg: No edema.     Comments: No pain with shoulder range of motion negative Hawkins/Kennedy, negative drop arm No evidence of effusion in the fingers or wrist  Skin:    General: Skin is warm and dry.  Neurological:     Mental Status: She is alert and oriented to person, place, and time. Mental status is at baseline.     Gait: Gait abnormal.     Comments: Motor strength is 4/5 in the right deltoid, bicep, tricep, grip, hip flexor, knee extensor, ankle dorsiflexor and plantar flexor 5/5 in the left deltoid, bicep, tricep, grip, hip flexor, knee extensor, ankle dorsiflexor and plantar flexor. Sensation intact light touch although it does feel different compared to the left side in the in the right hand.  The patient has decreased pinprick right hemifacial compared to left. Intact pinprick sensation in the right hand.  Intact light touch bilateral lower limbs. There is no evidence of facial droop Gait wide-based using a cane no evidence of toe drag or knee instability           Assessment & Plan:  #1.  History of left thalamic infarct with residual right hemisensory deficits mild motor deficits which do not appear to be significantly changed since last visit.  We discussed that she may be developing some sensory dysesthesias and that the gabapentin dose can be increased from 100 3 times daily to 300 3 times daily I also  recommended that she follows up with vascular neurology.  She was seen by Dr. Leonie Man in the hospital will make referral to [facility] neurology. 2.  History of myasthenia gravis follows at [facility] for this

## 2020-10-22 NOTE — Patient Instructions (Addendum)
Have made a referral to the stroke neurologist who saw you in the hospital last year.    Do not use ibuprofen for pain  May use tylenol 500mg  up to 4 times a day  Will increase gabapentin dose may f/u with primary care for additional dose adjustments

## 2020-10-24 ENCOUNTER — Encounter: Payer: Medicare Other | Admitting: Physical Medicine & Rehabilitation

## 2020-10-29 ENCOUNTER — Telehealth: Payer: Self-pay | Admitting: Neurology

## 2020-10-29 ENCOUNTER — Ambulatory Visit: Payer: Medicare Other | Admitting: Neurology

## 2020-10-29 ENCOUNTER — Encounter: Payer: Self-pay | Admitting: Neurology

## 2020-10-29 VITALS — BP 170/76 | HR 90 | Ht 63.0 in | Wt 190.0 lb

## 2020-10-29 DIAGNOSIS — R202 Paresthesia of skin: Secondary | ICD-10-CM

## 2020-10-29 DIAGNOSIS — G7000 Myasthenia gravis without (acute) exacerbation: Secondary | ICD-10-CM

## 2020-10-29 DIAGNOSIS — R519 Headache, unspecified: Secondary | ICD-10-CM

## 2020-10-29 MED ORDER — TOPIRAMATE 50 MG PO TABS: 50.0000 mg | ORAL_TABLET | Freq: Two times a day (BID) | ORAL | 3 refills | Status: DC

## 2020-10-29 NOTE — Progress Notes (Signed)
[facility] [address] [place]. [place] [address] [phone]       OFFICE CONSULT NOTE  [name] Date of Birth:  01-29-1951 Medical Record Number:  [id number]   Referring MD: Alysia Penna  Reason for Referral: Numbness and headache  HPI: [name] is a 70 year old African-American lady seen today for office consultation visit.  History is obtained from the patient, review of electronic medical records as well as care everywhere and I personally reviewed pertinent imaging films in PACS.  She has past medical history of hypertension, hyperlipidemia, obesity, asthma, hypothyroidism, seronegative ocular myasthenia, sleep apnea, thyroid carcinoma, diabetes.  Patient states that she has had new onset of numbness involving the right face for the last few months.  This is intermittent and can last for several minutes to hours and can occur at variable frequency couple of times a week to once every couple of weeks.  She is also noticed for the last several weeks headaches which is sharp shooting severe lasting few seconds involving mostly the frontal and occasionally occipital regions.  These also occur to 3 times a day.  She is been taking some Tylenol which seems to help.  There is occasional sweating with these headaches but she denies any tearing of her eyes or redness accompanying the headaches.  Patient has tried taking gabapentin for the numbness which has not helped.  She is presently on 300 mg 3 times daily which was recently increased.  She has actually history of left thalamic lacunar infarct in April 2021 at that time she did have some right face paresthesias and numbness which actually resolved several months after till they recurred again recently.  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  She has been following up with Dr. Nanine Means neurologist at [facility] and is presently on CellCept 1500 mg twice  daily and prednisone 10 mg daily.  She still has some mild ptosis and intermittent diplopia but symptoms have been stable for quite some time.  She does have an appointment to see Dr. Nanine Means in June.  She denies any recurrent stroke or TIA symptoms.  She is on aspirin which is tolerating well without bruising or bleeding.  She states her sugars are now better controlled and she is current only on insulin pump and uses her insulin sensor and has an upcoming appointment at the end of the month with endocrinologist Dr. Soyla Murphy and will have lipid profile and A1c checked at that visit.  She states her blood pressure is usually runs pretty good though today it is elevated at 170/76 as she was late for the appointment and she could not find her office.  She denies any loss of vision with her recent new headaches or muscle aches or pains or jaw claudication or scalp tenderness.  ROS:   14 system review of systems is positive for numbness, headache, drooping of eyelids, diplopia, wrist pain and all other systems negative PMH:  Past Medical History:  Diagnosis Date  . Arthritis    "all over my body" (03/13/2013)  . Asthma   . Asymptomatic carotid artery stenosis, bilateral 10/08/2018  . GERD (gastroesophageal reflux disease)   . H/O hiatal hernia   . Heart murmur   . Hypercholesterolemia 10/08/2018  . Hypertension   . Hypothyroidism   . Myasthenia gravis ([place])    "in my eyes; diagnsosed > 7 yr ago" (03/13/2013)  . Sleep apnea    on CPAP  .  Thyroid carcinoma ([place])   . Type II diabetes mellitus ([place])     Social History:  Social History   Socioeconomic History  . Marital status: Single    Spouse name: Not on file  . Number of children: 0  . Years of education: college  . Highest education level: Not on file  Occupational History  . Occupation: retired  Tobacco Use  . Smoking status: Never Smoker  . Smokeless tobacco: Never Used  Vaping Use  . Vaping Use: Never used  Substance and Sexual Activity   . Alcohol use: No    Alcohol/week: 0.0 standard drinks  . Drug use: No  . Sexual activity: Never  Other Topics Concern  . Not on file  Social History Narrative   Lives alone   Right handed   Drinks no caffeine   Social Determinants of Health   Financial Resource Strain: Not on file  Food Insecurity: Not on file  Transportation Needs: Not on file  Physical Activity: Not on file  Stress: Not on file  Social Connections: Not on file  Intimate Partner Violence: Not on file    Medications:   Current Outpatient Medications on File Prior to Visit  Medication Sig Dispense Refill  . acetaminophen (TYLENOL) 325 MG tablet Take 2 tablets (650 mg total) by mouth every 4 (four) hours as needed for mild pain (or temp > 37.5 C (99.5 F)).    Marland Kitchen aspirin EC 81 MG EC tablet Take 1 tablet (81 mg total) by mouth daily.    . B-D ULTRAFINE III SHORT PEN 31G X 8 MM MISC     . Biotin 5000 MCG CAPS Take 5,000 mcg by mouth daily with breakfast.     . BYDUREON BCISE 2 MG/0.85ML AUIJ Inject 2 mg into the skin every Monday.     . canagliflozin (INVOKANA) 100 MG TABS tablet Take 1 tablet (100 mg total) by mouth daily before breakfast. 30 tablet 0  . Continuous Blood Gluc Receiver (FREESTYLE LIBRE 14 DAY READER) DEVI 1 Device by Does not apply route as directed.     . Continuous Blood Gluc Sensor (FREESTYLE LIBRE 14 DAY SENSOR) MISC Inject 1 patch into the skin every 14 (fourteen) days.    . cycloSPORINE (RESTASIS) 0.05 % ophthalmic emulsion Place 1 drop into both eyes 2 (two) times daily. 0.4 mL 0  . diclofenac Sodium (VOLTAREN) 1 % GEL Apply 2 g topically 4 (four) times daily. 2 g 0  . esomeprazole (NEXIUM) 40 MG capsule Take 1 capsule (40 mg total) by mouth 2 (two) times daily. 60 capsule 0  . furosemide (LASIX) 40 MG tablet Take 40 mg by mouth daily as needed.    . gabapentin (NEURONTIN) 300 MG capsule Take 1 capsule (300 mg total) by mouth 3 (three) times daily. 90 capsule 1  . GAMUNEX-C 40 GM/400ML SOLN  q 6 weeks    . Insulin Disposable Pump (OMNIPOD DASH 5 PACK PODS) MISC USE UNDER THE SKIN AS DIRECTED EVERY 1 TO 2 DAYS    . insulin lispro (HUMALOG) 100 UNIT/ML cartridge Inject into the skin 3 (three) times daily with meals. On sliding scale    . levothyroxine (SYNTHROID) 175 MCG tablet Take 175 mcg by mouth daily before breakfast.    . lidocaine (LIDODERM) 5 % Place 1 patch onto the skin daily. Remove & Discard patch within 12 hours or as directed by MD 30 patch 0  . loratadine (CLARITIN) 10 MG tablet Take 1 tablet (10 mg  total) by mouth at bedtime. 30 tablet 0  . meclizine (ANTIVERT) 25 MG tablet Take 25 mg by mouth 3 (three) times daily as needed for dizziness (or migraine-related nausea).     . mometasone (NASONEX) 50 MCG/ACT nasal spray Place 2 sprays into the nose daily as needed (for allergies).     . montelukast (SINGULAIR) 10 MG tablet Take 1 tablet (10 mg total) by mouth every morning. 30 tablet 0  . mycophenolate (CELLCEPT) 500 MG tablet Take 1,500 mg by mouth in the morning and at bedtime.     Marland Kitchen olmesartan (BENICAR) 20 MG tablet Take 1 tablet (20 mg total) by mouth daily. 30 tablet 0  . predniSONE (DELTASONE) 10 MG tablet Take 10 mg by mouth every morning.    Marland Kitchen PRESCRIPTION MEDICATION See admin instructions. CPAP- At bedtime    . PROAIR HFA 108 (90 BASE) MCG/ACT inhaler Inhale 2 puffs into the lungs every 6 (six) hours as needed for wheezing or shortness of breath.     . riTUXimab (RITUXAN) 500 MG/50ML injection Inject 1,000 mg into the vein See admin instructions. 1,000 mg via IV, then another 1,000 mg in two weeks- repeat after 6 months: "riTUXimab (RITUXAN) 1,000 mg in sodium chloride 0.9% 1,000 mL infusion- Begin infusion at 50 mL/hr, if no hypersensitivity or infusion-related events occur, may increase by 50 mL/hr every 30 minutes to a maximum infusion rate of 400 mL/hr"    . rosuvastatin (CRESTOR) 40 MG tablet Take 1 tablet (40 mg total) by mouth at bedtime. 30 tablet 0  .  spironolactone (ALDACTONE) 25 MG tablet Take 1 tablet (25 mg total) by mouth daily. 30 tablet 0  . vitamin B-12 (CYANOCOBALAMIN) 1000 MCG tablet Take 1,000 mcg by mouth daily.    . Vitamin D, Ergocalciferol, (DRISDOL) 50000 UNITS CAPS capsule Take 50,000 Units by mouth every Monday.     . ezetimibe (ZETIA) 10 MG tablet Take 1 tablet (10 mg total) by mouth daily. 90 tablet 1  . levothyroxine (SYNTHROID) 137 MCG tablet Take 137 mcg by mouth daily.    . Levothyroxine Sodium 137 MCG CAPS Take 1 capsule (137 mcg total) by mouth daily before breakfast. 30 capsule 0  . SYMBICORT 160-4.5 MCG/ACT inhaler USE 2 INHALATIONS TWICE A DAY TO PREVENT COUGH OR WHEEZE. RINSE, GARGLE AND SPIT AFTER USE (Patient taking differently: Inhale 2 puffs into the lungs See admin instructions.) 30.6 g 1   No current facility-administered medications on file prior to visit.    Allergies:   Allergies  Allergen Reactions  . Contrast Media [Iodinated Diagnostic Agents] Anaphylaxis    01-10-15---PT GIVEN 13 HR PRE MEDS FOR CT--PT TOLERATED IV CONTRAST W/O ANY REACTION------[name] RT-R CT  . Fluorescein Shortness Of Breath and Other (See Comments)    (Dye)  . Iodine Anaphylaxis    Contrast dye - iodine  . Molds & Smuts Shortness Of Breath and Other (See Comments)    Congestion and wheezing, also  . Shellfish-Derived Products Anaphylaxis, Shortness Of Breath, Swelling and Other (See Comments)    Welts, also  . Azithromycin Nausea Only  . Codeine Hives and Nausea Only  . Metformin Hcl Er Nausea Only  . Tramadol Hcl Nausea Only  . Other Rash and Other (See Comments)    Coban causes welts, also    Physical Exam General: Mildly obese middle-aged African-American lady seated, in no evident distress Head: head normocephalic and atraumatic.   Neck: supple with no carotid or supraclavicular bruits Cardiovascular: regular rate  and rhythm, no murmurs Musculoskeletal: no deformity.  Wearing a right wrist splint. Skin:   no rash/petichiae Vascular:  Normal pulses all extremities  Neurologic Exam Mental Status: Awake and fully alert. Oriented to place and time. Recent and remote memory intact. Attention span, concentration and fund of knowledge appropriate. Mood and affect appropriate.  Cranial Nerves: Fundoscopic exam difficult through undilated pupils.  Pupils equal, briskly reactive to light. Extraocular movements full without nystagmus.  Bilateral resting ptosis left greater than right which increases with sustained upgaze for 30 seconds with patient developing vertical diplopia.  Vision acuity is diminished in the right eye.  Visual fields full to confrontation in the left eye but diminished in the right.. Hearing intact. Facial sensation intact. Face, tongue, palate moves normally and symmetrically.  Motor: Normal bulk and tone. Normal strength in all tested extremity muscles.  Mild fine action tremor of outstretched upper extremities right greater than left. Sensory.:  Subjective diminished right hemibody touch , pinprick , position and vibratory sensation.  With splitting of the midline to touch and forehead to vibration. Coordination: Rapid alternating movements normal in all extremities. Finger-to-nose and heel-to-shin performed accurately bilaterally. Gait and Station: Arises from chair without difficulty.  Uses a walker.  Stance is slightly broad-based. Gait is cautious but with reasonable balance with walker.. Able to heel, toe and tandem walk with moderate t difficulty.  Reflexes: 1+ and symmetric. Toes downgoing.   NIHSS 1 Modified Rankin  2  ASSESSMENT: 70 year old African-American lady with new onset right face paresthesias as well as intermittent headaches which need evaluation.  Remote history of left thalamic lacunar infarct from small vessel disease.  Vascular risk factors of diabetes, hypertension, hyperlipidemia, obesity and sleep apnea.  She also has seronegative ocular myasthenia which appears  stable and well controlled on current medication regimen.     PLAN: I had a long discussion with the patient with regards to her new complaints of right face paresthesias which may represent recrudescence of her old thalamic infarct versus new infarct as well as new complaints of transient shooting temporal headaches and I discussed treatment options.  I recommend the start Topamax 50 mg daily for a week to be increased if tolerated without side effects to twice daily.  Check MRI scan of the brain with and without contrast to rule out structural lesions.  She will continue follow-up with Dr. Nanine Means at [facility] for her myasthenia gravis and continue present dosages of CellCept and prednisone.  She will continue on aspirin for stroke prevention and maintain aggressive risk factor modification with strict control of hypertension with blood pressure goal below 130/90, lipids with LDL cholesterol goal below 70 mg percent and diabetes with hemoglobin A1c goal below 6.5%.  She was also encouraged to be compliant with using her CPAP every night.  She will return for follow-up to see me in 3 months or call earlier if necessary.  Greater than 50% time during this 60-minute prolonged consultation visit was spent on counseling and coordination of care about her facial paresthesias and new headaches as well as remote stroke and ocular myasthenia discussion and answering questions. Antony Contras, MD Note: This document was prepared with digital dictation and possible smart phrase technology. Any transcriptional errors that result from this process are unintentional.

## 2020-10-29 NOTE — Patient Instructions (Addendum)
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  I recommend the start Topamax 50 mg daily for a week to be increased if tolerated without side effects to twice daily.  Check MRI scan of the brain with and without contrast to rule out structural lesions.  She will continue follow-up with Dr. Nanine Means at [facility] for her myasthenia gravis and continue present dosages of CellCept and prednisone.  She will return for follow-up to see me in 3 months or call earlier if necessary.  Paresthesia Paresthesia is a burning or prickling feeling. This feeling can happen in any part of the body. It often happens in the hands, arms, legs, or feet. Usually, it is not painful. In most cases, the feeling goes away in a short time and is not a sign of a serious problem. If you have paresthesia that lasts a long time, you need to be seen by your doctor. Follow these instructions at home: Alcohol use  Do not drink alcohol if: ? Your doctor tells you not to drink. ? You are pregnant, may be pregnant, or are planning to become pregnant.  If you drink alcohol: ? Limit how much you use to:  0-1 drink a day for women.  0-2 drinks a day for men. ? Be aware of how much alcohol is in your drink. In the [place] one drink equals one 12 oz bottle of beer (355 mL), one 5 oz glass of wine (148 mL), or one 1 oz glass of hard liquor (44 mL).   Nutrition  Eat a healthy diet. This includes: ? Eating foods that have a lot of fiber in them, such as fresh fruits and vegetables, whole grains, and beans. ? Limiting foods that have a lot of fat and processed sugars in them, such as fried or sweet foods.   General instructions  Take over-the-counter and prescription medicines only as told by your doctor.  Do  not use any products that have nicotine or tobacco in them, such as cigarettes and e-cigarettes. If you need help quitting, ask your doctor.  If you have diabetes, work with your doctor to make sure your blood sugar stays in a healthy range.  If your feet feel numb: ? Check for redness, warmth, and swelling every day. ? Wear padded socks and comfortable shoes. These help protect your feet.  Keep all follow-up visits as told by your doctor. This is important. Contact a doctor if:  You have paresthesia that gets worse or does not go away.  Lose feeling (have numbness) after an injury.  Your burning or prickling feeling gets worse when you walk.  You have pain or cramps.  You feel dizzy or pass out (faint).  You have a rash. Get help right away if you:  Feel weak or have new weakness in an arm or leg.  Have trouble walking or moving.  Have problems speaking, understanding, or seeing.  Feel confused.  Cannot control when you pee (urinate) or poop (have a bowel movement). Summary  Paresthesia is a burning or prickling feeling. It often happens in the hands, arms, legs, or feet.  In most cases, the feeling goes away in a short time and is not a sign of a serious problem.  If you have paresthesia that lasts a  long time, you need to be seen by your doctor. This information is not intended to replace advice given to you by your health care provider. Make sure you discuss any questions you have with your health care provider. Document Revised: 05/21/2020 Document Reviewed: 05/21/2020 Elsevier Patient Education  2021 [employer].

## 2020-10-29 NOTE — Telephone Encounter (Signed)
[employer] medicare order sent to [facility]. No auth they will reach out to the patient to schedule.  

## 2020-10-30 ENCOUNTER — Telehealth: Payer: Self-pay | Admitting: Neurology

## 2020-10-30 NOTE — Telephone Encounter (Signed)
Pt states she was advised to call and inform the MRI office informed her to call re: the dye/contrast, pt is unable to have a MRI with that.  Pt asking for a call to discuss.

## 2020-10-31 ENCOUNTER — Other Ambulatory Visit: Payer: Self-pay | Admitting: Neurology

## 2020-10-31 ENCOUNTER — Telehealth: Payer: Self-pay | Admitting: *Deleted

## 2020-10-31 DIAGNOSIS — R200 Anesthesia of skin: Secondary | ICD-10-CM

## 2020-10-31 NOTE — Telephone Encounter (Signed)
Okay done

## 2020-10-31 NOTE — Telephone Encounter (Addendum)
Pt called now stating that they have an opening today for 3:40pm and would like to know if the updated order without contrast can be sent today so that she can be seen. Please advise. Pt is reachable at [phone]

## 2020-10-31 NOTE — Telephone Encounter (Signed)
Received call from [place] at [facility], stated she needs orders to access/ de access patient port for her MRI. I informed her that patient called stating she doesn't want contrast. Doroteo Bradford, RN for Dr Leonie Man has sent this message to Dr Leonie Man. So MRI order pending as to whether it'll be changed to without contrast. I informed Dutch Gray send this note to [place], so she can call her back with Dr Clydene Fake decision. Levada Dy  verbalized understanding, appreciation.

## 2020-11-01 ENCOUNTER — Ambulatory Visit: Payer: Medicare Other | Admitting: Physical Medicine & Rehabilitation

## 2020-11-01 ENCOUNTER — Telehealth: Payer: Self-pay | Admitting: Neurology

## 2020-11-01 ENCOUNTER — Other Ambulatory Visit: Payer: Self-pay | Admitting: Neurology

## 2020-11-01 DIAGNOSIS — R200 Anesthesia of skin: Secondary | ICD-10-CM

## 2020-11-01 NOTE — Telephone Encounter (Signed)
[employer] medicare no auth. Patient is scheduled at [facility] for 11/12/20.

## 2020-11-09 ENCOUNTER — Ambulatory Visit
Admission: RE | Admit: 2020-11-09 | Discharge: 2020-11-09 | Disposition: A | Discharge status: Home / Self Care | Payer: Medicare Other | Source: Ambulatory Visit | Attending: Neurology | Admitting: Neurology

## 2020-11-09 ENCOUNTER — Other Ambulatory Visit: Payer: Self-pay

## 2020-11-09 DIAGNOSIS — R200 Anesthesia of skin: Secondary | ICD-10-CM | POA: Diagnosis not present

## 2020-11-09 DIAGNOSIS — G9389 Other specified disorders of brain: Secondary | ICD-10-CM | POA: Diagnosis not present

## 2020-11-09 DIAGNOSIS — H538 Other visual disturbances: Secondary | ICD-10-CM | POA: Diagnosis not present

## 2020-11-09 DIAGNOSIS — I6381 Other cerebral infarction due to occlusion or stenosis of small artery: Secondary | ICD-10-CM | POA: Diagnosis not present

## 2020-11-09 DIAGNOSIS — I771 Stricture of artery: Secondary | ICD-10-CM | POA: Diagnosis not present

## 2020-11-10 NOTE — Progress Notes (Signed)
Kindly inform the patient that MRI scan of the brain shows expected changes over time in the left deep brain stroke from April 2021 but no new or unexpected changes.

## 2020-11-11 ENCOUNTER — Telehealth: Payer: Self-pay | Admitting: Neurology

## 2020-11-11 NOTE — Telephone Encounter (Signed)
Pt is asking to be called once Dr Leonie Man has been able to review the results to her MRI

## 2020-11-11 NOTE — Telephone Encounter (Signed)
Kindly inform the patient that MRI scan of the brain is showing expected changes with time in her small stroke in the deep portion of the brain on the left side.  No new or worrisome findings

## 2020-11-12 ENCOUNTER — Other Ambulatory Visit: Payer: Medicare Other

## 2020-11-12 ENCOUNTER — Encounter: Payer: Self-pay | Admitting: *Deleted

## 2020-11-12 NOTE — Telephone Encounter (Signed)
Called pt back and relayed Dr. Clydene Fake message. She verbalized understanding. She is concerned about her right sided facial numbness. Wondering what MD recommends since no reason found on MRI. She also saw MRI report and saw that there were some inflammation in sinuses. Wanting to know what he would recommend for this. Advised I will send message to MD and we will call back.

## 2020-11-12 NOTE — Telephone Encounter (Signed)
I feel the numbness on the right side of her face could be explained by the small stroke in the deep portion of the brain on the left side.  I expect this to improve over time and no action necessary.  She needs to contact her primary care physician to discuss treatment if any for inflammation of her sinuses.

## 2020-11-13 NOTE — Telephone Encounter (Signed)
Called pt back. Relayed Dr. Clydene Fake recommendation. She verbalized understanding and appreciation.

## 2020-11-20 DIAGNOSIS — E119 Type 2 diabetes mellitus without complications: Secondary | ICD-10-CM | POA: Diagnosis not present

## 2020-11-20 DIAGNOSIS — Z794 Long term (current) use of insulin: Secondary | ICD-10-CM | POA: Diagnosis not present

## 2020-11-21 DIAGNOSIS — E890 Postprocedural hypothyroidism: Secondary | ICD-10-CM | POA: Diagnosis not present

## 2020-11-27 DIAGNOSIS — I69393 Ataxia following cerebral infarction: Secondary | ICD-10-CM | POA: Diagnosis not present

## 2020-11-27 DIAGNOSIS — E890 Postprocedural hypothyroidism: Secondary | ICD-10-CM | POA: Diagnosis not present

## 2020-11-27 DIAGNOSIS — I10 Essential (primary) hypertension: Secondary | ICD-10-CM | POA: Diagnosis not present

## 2020-11-27 DIAGNOSIS — Z85850 Personal history of malignant neoplasm of thyroid: Secondary | ICD-10-CM | POA: Diagnosis not present

## 2020-11-27 DIAGNOSIS — G7000 Myasthenia gravis without (acute) exacerbation: Secondary | ICD-10-CM | POA: Diagnosis not present

## 2020-11-27 DIAGNOSIS — E1140 Type 2 diabetes mellitus with diabetic neuropathy, unspecified: Secondary | ICD-10-CM | POA: Diagnosis not present

## 2020-11-27 DIAGNOSIS — E559 Vitamin D deficiency, unspecified: Secondary | ICD-10-CM | POA: Diagnosis not present

## 2020-11-27 DIAGNOSIS — C73 Malignant neoplasm of thyroid gland: Secondary | ICD-10-CM | POA: Diagnosis not present

## 2020-11-27 DIAGNOSIS — E1065 Type 1 diabetes mellitus with hyperglycemia: Secondary | ICD-10-CM | POA: Diagnosis not present

## 2020-11-27 DIAGNOSIS — Z9641 Presence of insulin pump (external) (internal): Secondary | ICD-10-CM | POA: Diagnosis not present

## 2020-11-27 DIAGNOSIS — E7800 Pure hypercholesterolemia, unspecified: Secondary | ICD-10-CM | POA: Diagnosis not present

## 2020-12-11 DIAGNOSIS — G7000 Myasthenia gravis without (acute) exacerbation: Secondary | ICD-10-CM | POA: Diagnosis not present

## 2020-12-11 DIAGNOSIS — H3500 Unspecified background retinopathy: Secondary | ICD-10-CM | POA: Diagnosis not present

## 2020-12-21 DIAGNOSIS — Z794 Long term (current) use of insulin: Secondary | ICD-10-CM | POA: Diagnosis not present

## 2020-12-21 DIAGNOSIS — E119 Type 2 diabetes mellitus without complications: Secondary | ICD-10-CM | POA: Diagnosis not present

## 2020-12-25 DIAGNOSIS — E890 Postprocedural hypothyroidism: Secondary | ICD-10-CM | POA: Diagnosis not present

## 2020-12-26 DIAGNOSIS — H3500 Unspecified background retinopathy: Secondary | ICD-10-CM | POA: Diagnosis not present

## 2020-12-26 DIAGNOSIS — G7000 Myasthenia gravis without (acute) exacerbation: Secondary | ICD-10-CM | POA: Diagnosis not present

## 2021-01-01 ENCOUNTER — Other Ambulatory Visit: Payer: Self-pay

## 2021-01-01 ENCOUNTER — Encounter: Payer: Self-pay | Admitting: Cardiology

## 2021-01-01 ENCOUNTER — Ambulatory Visit: Payer: Medicare Other | Admitting: Cardiology

## 2021-01-01 VITALS — BP 123/75 | HR 95 | Temp 98.1°F | Resp 17 | Ht 63.0 in | Wt 196.0 lb

## 2021-01-01 DIAGNOSIS — Z794 Long term (current) use of insulin: Secondary | ICD-10-CM | POA: Diagnosis not present

## 2021-01-01 DIAGNOSIS — Z8669 Personal history of other diseases of the nervous system and sense organs: Secondary | ICD-10-CM | POA: Diagnosis not present

## 2021-01-01 DIAGNOSIS — I708 Atherosclerosis of other arteries: Secondary | ICD-10-CM

## 2021-01-01 DIAGNOSIS — I6523 Occlusion and stenosis of bilateral carotid arteries: Secondary | ICD-10-CM | POA: Diagnosis not present

## 2021-01-01 DIAGNOSIS — E7800 Pure hypercholesterolemia, unspecified: Secondary | ICD-10-CM

## 2021-01-01 DIAGNOSIS — N1831 Chronic kidney disease, stage 3a: Secondary | ICD-10-CM | POA: Diagnosis not present

## 2021-01-01 DIAGNOSIS — I10 Essential (primary) hypertension: Secondary | ICD-10-CM

## 2021-01-01 DIAGNOSIS — E1122 Type 2 diabetes mellitus with diabetic chronic kidney disease: Secondary | ICD-10-CM | POA: Diagnosis not present

## 2021-01-01 NOTE — Progress Notes (Signed)
Primary Physician/Referring:  [name] Morning, DO  Patient ID: [name], female    DOB: [birth year], 70 y.o.   MRN: [id number]  Chief Complaint  Patient presents with  . carotid stenosis  . Follow-up    1 year   HPI:    [name]  is a 70 y.o. African-American female  with  asymptomatic carotid stenosis, uncontrolled DM with stage 3 CKD and diabetic retinopathy,, hypertension, hyperlipidemia, exertional dyspnea and chest pain,  thyroid nodules, follows Dr. Chalmers Cater, myasthenia gravis follows [facility] with immunotherapy, and GERD. She has chronic fatigue, is being actively treated at [facility] for myasthenia gravis with immunotherapy. Anemia of chronic disease, with normal iron studies, low normal B12 on 04/14/2019.  I seen last seen her on 06/22/2019. She has not had f/u for her vascular studies and f/u of CV risks since.  Patient was admitted to the hospital on 12/12/2019 with stroke, since then still has residual right-sided weakness.  She is now walking without a walker.  She has not had any new neurologic deficits since then.  She has also in the interim developed diabetic retinopathy.  She has not had any chest pain.  Dyspnea has remained stable.  Past Medical History:  Diagnosis Date  . Arthritis    "all over my body" (03/13/2013)  . Asthma   . Asymptomatic carotid artery stenosis, bilateral 10/08/2018  . GERD (gastroesophageal reflux disease)   . H/O hiatal hernia   . Heart murmur   . Hypercholesterolemia 10/08/2018  . Hypertension   . Hypothyroidism   . Myasthenia gravis ([place])    "in my eyes; diagnsosed > 7 yr ago" (03/13/2013)  . Sleep apnea    on CPAP  . Thyroid carcinoma ([place])   . Type II diabetes mellitus ([place])    Past Surgical History:  Procedure Laterality Date  . ABDOMINAL HYSTERECTOMY    . ANTERIOR CERVICAL DECOMP/DISCECTOMY FUSION     "I've had severa ORs; always went in from the front" (03/13/2013)  . APPENDECTOMY    . CARDIAC  CATHETERIZATION     "several" (03/13/2013)  . CARPAL TUNNEL RELEASE Right   . CATARACT EXTRACTION W/ INTRAOCULAR LENS  IMPLANT, BILATERAL Bilateral   . CHOLECYSTECTOMY    . KNEE ARTHROSCOPY Left   . SHOULDER ARTHROSCOPY W/ ROTATOR CUFF REPAIR Left   . TONSILLECTOMY    . TOTAL THYROIDECTOMY     Social History   Socioeconomic History  . Marital status: Single    Spouse name: Not on file  . Number of children: 0  . Years of education: college  . Highest education level: Not on file  Occupational History  . Occupation: retired  Tobacco Use  . Smoking status: Never Smoker  . Smokeless tobacco: Never Used  Vaping Use  . Vaping Use: Never used  Substance and Sexual Activity  . Alcohol use: No    Alcohol/week: 0.0 standard drinks  . Drug use: No  . Sexual activity: Never  Other Topics Concern  . Not on file  Social History Narrative   Lives alone   Right handed   Drinks no caffeine   Social Determinants of Health   Financial Resource Strain: Not on file  Food Insecurity: Not on file  Transportation Needs: Not on file  Physical Activity: Not on file  Stress: Not on file  Social Connections: Not on file  Intimate Partner Violence: Not on file   ROS  Review of Systems  Cardiovascular: Negative for chest  pain, dyspnea on exertion and leg swelling.  Musculoskeletal: Positive for back pain and muscle weakness.  Gastrointestinal: Negative for melena.  Neurological: Positive for focal weakness (right arm and leg), loss of balance, numbness (right arm) and weakness.   Objective  Blood pressure 123/75, pulse 95, temperature 98.1 F (36.7 C), temperature source Temporal, resp. rate 17, height '5\' 3"'  (1.6 m), weight 196 lb (88.9 kg), SpO2 96 %. Body mass index is 34.72 kg/m.  Vitals with BMI 01/01/2021 10/29/2020 10/22/2020  Height '5\' 3"'  '5\' 3"'  '5\' 3"'   Weight 196 lbs 190 lbs 199 lbs  BMI 34.73 16.10 96.04  Systolic 540 981 191  Diastolic 75 76 78  Pulse 95 90 101      Physical  Exam Constitutional:      General: She is not in acute distress.    Appearance: She is well-developed. She is obese.     Comments: Mildly obese  HENT:     Head: Atraumatic.  Eyes:     Conjunctiva/sclera: Conjunctivae normal.  Neck:     Thyroid: No thyromegaly.     Vascular: Carotid bruit (right carotid bruit) present. No JVD.  Cardiovascular:     Rate and Rhythm: Normal rate and regular rhythm.     Pulses:          Radial pulses are 2+ on the right side and 2+ on the left side.       Femoral pulses are 2+ on the right side.      Popliteal pulses are 0 on the right side and 0 on the left side.       Dorsalis pedis pulses are 1+ on the right side and 1+ on the left side.       Posterior tibial pulses are 0 on the right side and 0 on the left side.     Heart sounds: Normal heart sounds. No murmur heard. No gallop.      Comments: popliteal pulse difficult to feel due to patient's body habitus.  Pulmonary:     Effort: Pulmonary effort is normal.     Breath sounds: Normal breath sounds.  Abdominal:     General: Bowel sounds are normal.     Palpations: Abdomen is soft.  Musculoskeletal:        General: Normal range of motion.     Cervical back: Neck supple.  Skin:    General: Skin is warm and dry.  Neurological:     Mental Status: She is alert.    Radiology: No results found.  Laboratory examination:   No results for input(s): NA, K, CL, CO2, GLUCOSE, BUN, CREATININE, CALCIUM, GFRNONAA, GFRAA in the last 8760 hours. CMP Latest Ref Rng & Units 12/28/2019 12/23/2019 12/19/2019  Glucose 70 - 99 mg/dL 308(H) 467(H) 124(H)  BUN 8 - 23 mg/dL 20 - 20  Creatinine 0.44 - 1.00 mg/dL 0.83 - 0.69  Sodium 135 - 145 mmol/L 137 - 140  Potassium 3.5 - 5.1 mmol/L 3.8 - 3.3(L)  Chloride 98 - 111 mmol/L 109 - 106  CO2 22 - 32 mmol/L 21(L) - 24  Calcium 8.9 - 10.3 mg/dL 9.0 - 10.1  Total Protein 6.5 - 8.1 g/dL 6.2(L) - 7.8  Total Bilirubin 0.3 - 1.2 mg/dL 0.4 - 0.5  Alkaline Phos 38 - 126 U/L  89 - 98  AST 15 - 41 U/L 38 - 22  ALT 0 - 44 U/L 33 - 24   CBC Latest Ref Rng & Units 12/28/2019 12/19/2019 03/14/2013  WBC 4.0 - 10.5 K/uL 6.6 6.7 6.2  Hemoglobin 12.0 - 15.0 g/dL 10.0(L) 11.8(L) 10.0(L)  Hematocrit 36.0 - 46.0 % 31.7(L) 36.2 29.2(L)  Platelets 150 - 400 K/uL 254 261 196   Lipid Panel No results for input(s): CHOL, TRIG, LDLCALC, VLDL, HDL, CHOLHDL, LDLDIRECT in the last 8760 hours.   Lipid Panel     Component Value Date/Time   CHOL 193 12/21/2019 0618   CHOL 243 (H) 04/14/2019 1018   TRIG 237 (H) 12/21/2019 0618   HDL 45 12/21/2019 0618   HDL 67 04/14/2019 1018   CHOLHDL 4.3 12/21/2019 0618   VLDL 47 (H) 12/21/2019 0618   LDLCALC 101 (H) 12/21/2019 0618   LDLCALC 146 (H) 04/14/2019 1018   LDLDIRECT 147 (H) 05/31/2019 1101   HEMOGLOBIN A1C Lab Results  Component Value Date   HGBA1C 9.1 (H) 12/20/2019   MPG 214.47 12/20/2019   External labs:  Labs 12/26/2020:  Hb 10.3/HCT 32.4, platelets 209, normal indicis.  Sodium 135, potassium 3.8, BUN 34, creatinine 1.1, EGFR 55 mL.  Serum glucose 327 mg.  CMP otherwise normal.  TSH normal at 0.33.  A1c 9.0%.  Vitamin D 36.1.  Labs 02/05/2020:  Total cholesterol 272, triglycerides 203, HDL 77, LDL 159.  Medications   Current Outpatient Medications on File Prior to Visit  Medication Sig Dispense Refill  . acetaminophen (TYLENOL) 325 MG tablet Take 2 tablets (650 mg total) by mouth every 4 (four) hours as needed for mild pain (or temp > 37.5 C (99.5 F)).    Marland Kitchen aspirin EC 81 MG EC tablet Take 1 tablet (81 mg total) by mouth daily.    . B-D ULTRAFINE III SHORT PEN 31G X 8 MM MISC     . Biotin 5000 MCG CAPS Take 5,000 mcg by mouth daily with breakfast.     . BYDUREON BCISE 2 MG/0.85ML AUIJ Inject 2 mg into the skin every Monday.     . canagliflozin (INVOKANA) 100 MG TABS tablet Take 1 tablet (100 mg total) by mouth daily before breakfast. (Patient taking differently: Take 300 mg by mouth daily before breakfast.)  30 tablet 0  . Continuous Blood Gluc Receiver (FREESTYLE LIBRE 14 DAY READER) DEVI 1 Device by Does not apply route as directed.     . Continuous Blood Gluc Sensor (FREESTYLE LIBRE 14 DAY SENSOR) MISC Inject 1 patch into the skin every 14 (fourteen) days.    . cycloSPORINE (RESTASIS) 0.05 % ophthalmic emulsion Place 1 drop into both eyes 2 (two) times daily. 0.4 mL 0  . diclofenac Sodium (VOLTAREN) 1 % GEL Apply 2 g topically 4 (four) times daily. 2 g 0  . esomeprazole (NEXIUM) 40 MG capsule Take 1 capsule (40 mg total) by mouth 2 (two) times daily. 60 capsule 0  . ezetimibe (ZETIA) 10 MG tablet Take 1 tablet (10 mg total) by mouth daily. 90 tablet 1  . furosemide (LASIX) 40 MG tablet Take 40 mg by mouth daily as needed.    . gabapentin (NEURONTIN) 300 MG capsule Take 1 capsule (300 mg total) by mouth 3 (three) times daily. 90 capsule 1  . GAMUNEX-C 40 GM/400ML SOLN q 6 weeks    . Insulin Disposable Pump (OMNIPOD DASH 5 PACK PODS) MISC USE UNDER THE SKIN AS DIRECTED EVERY 1 TO 2 DAYS    . insulin lispro (HUMALOG) 100 UNIT/ML cartridge Inject into the skin 3 (three) times daily with meals. On sliding scale    . levothyroxine (SYNTHROID) 175 MCG tablet  Take 175 mcg by mouth daily before breakfast.    . lidocaine (LIDODERM) 5 % Place 1 patch onto the skin daily. Remove & Discard patch within 12 hours or as directed by MD 30 patch 0  . loratadine (CLARITIN) 10 MG tablet Take 1 tablet (10 mg total) by mouth at bedtime. 30 tablet 0  . meclizine (ANTIVERT) 25 MG tablet Take 25 mg by mouth 3 (three) times daily as needed for dizziness (or migraine-related nausea).     . montelukast (SINGULAIR) 10 MG tablet Take 1 tablet (10 mg total) by mouth every [name]. 30 tablet 0  . mycophenolate (CELLCEPT) 500 MG tablet Take 1,500 mg by mouth in the [name] and at bedtime.     Marland Kitchen olmesartan (BENICAR) 20 MG tablet Take 1 tablet (20 mg total) by mouth daily. 30 tablet 0  . predniSONE (DELTASONE) 10 MG tablet Take 10  mg by mouth every [name].    Marland Kitchen PRESCRIPTION MEDICATION See admin instructions. CPAP- At bedtime    . PROAIR HFA 108 (90 BASE) MCG/ACT inhaler Inhale 2 puffs into the lungs every 6 (six) hours as needed for wheezing or shortness of breath.     . riTUXimab (RITUXAN) 500 MG/50ML injection Inject 1,000 mg into the vein See admin instructions. 1,000 mg via IV, then another 1,000 mg in two weeks- repeat after 6 months: "riTUXimab (RITUXAN) 1,000 mg in sodium chloride 0.9% 1,000 mL infusion- Begin infusion at 50 mL/hr, if no hypersensitivity or infusion-related events occur, may increase by 50 mL/hr every 30 minutes to a maximum infusion rate of 400 mL/hr"    . rosuvastatin (CRESTOR) 40 MG tablet Take 1 tablet (40 mg total) by mouth at bedtime. 30 tablet 0  . spironolactone (ALDACTONE) 25 MG tablet Take 1 tablet (25 mg total) by mouth daily. 30 tablet 0  . topiramate (TOPAMAX) 50 MG tablet Take 1 tablet (50 mg total) by mouth 2 (two) times daily. Start 1 tablet daily x 1 week and then twice daily (Patient taking differently: Take 50 mg by mouth as needed. Start 1 tablet daily x 1 week and then twice daily) 60 tablet 3  . vitamin B-12 (CYANOCOBALAMIN) 1000 MCG tablet Take 1,000 mcg by mouth daily.    . Vitamin D, Ergocalciferol, (DRISDOL) 50000 UNITS CAPS capsule Take 50,000 Units by mouth every Monday.      No current facility-administered medications on file prior to visit.    Radiology:   MR angio of the head 12/21/2019: Atherosclerotic disease right middle cerebral artery branch otherwise negative. No large vessel occlusion.  MRA chest with and without contrast 12/20/2019: 1. No evidence of acute aortic dissection, aortic aneurysm or other acute vascular abnormality. 2. 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 3. Heterogeneous and irregular/ulcerated atherosclerotic plaque along the descending thoracic aorta. 4.  Concentric hypertrophy of the left ventricular myocardium suggests chronic systolic hypertension.  MRI of the brain 11/09/2020: 1. No acute intracranial abnormality. Expected evolution of the left thalamic lacunar infarct seen last year and otherwise normal for age noncontrast MRI appearance of the brain. 2. Prior cervical ACDF with adjacent segment disease resulting in significant spinal stenosis at C2-C3. Query myelopathic symptoms. Dedicated Cervical Spine MRI would be valuable.  Cardiac Studies:   Lexiscan myoview stress test 06/25/2017: 1. Resting EKG NSR. Stress EKG: Non diagnostic for ischemia due to pharmacologic stress testing. No ST-T changes of ischemia. 8/10 chest pain with Lexiscan infusion. 2. Myocardial  perfusion study was normal, without evidence of ischemia, ejection fraction 70%. Low risk study.  Echocardiogram 12/20/2019:  1. Left ventricular ejection fraction, by estimation, is 60 to 65%. The left ventricle has normal function. The left ventricle has no regional wall motion abnormalities. Left ventricular diastolic parameters are consistent with Grade I diastolic dysfunction (impaired relaxation). 2. Right ventricular systolic function is normal. The right ventricular size is normal. 3. Left atrial size was moderately dilated. 4. The mitral valve is grossly normal. Trivial mitral valve regurgitation. No evidence of mitral stenosis. 5. Mild calcification of the non coronary cusp.. The aortic valve is tricuspid. Aortic valve regurgitation is not visualized. No aortic stenosis is present. 6. The inferior vena cava is normal in size with greater than 50% respiratory variability,suggesting right atrial pressure of 3 mmHg.  Lower Extremity Venous Duplex  12/20/2019: RIGHT:  - There is no evidence of deep vein thrombosis in the lower extremity.  - No cystic structure found in the popliteal fossa.  LEFT:  - There is no evidence of deep vein thrombosis in the lower extremity.  -  No cystic structure found in the popliteal fossa.   Carotid artery duplex  12/11/2019: Stenosis in the right internal carotid artery (>=70%). Peak velocity 295/100 cm/S. The right PSV internal/common carotid artery ratio of 4.14 is consistent with a stenosis of >70%. 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 Antegrade right vertebral artery flow. Antegrade left vertebral artery flow. Follow up in six months is appropriate if clinically indicated. Compared to the study done on 04/10/2019, right ICA stenosis has progressed.  EKG:   EKG 01/01/2021: Normal sinus rhythm at rate of 92 bpm, left atrial enlargement, normal axis.  Incomplete right bundle branch block.  Nonspecific T abnormality.    EKG 04/14/2019: Normal sinus rhythm at rate of 98 bpm, normal axis.  No evidence of ischemia, normal EKG.  Assessment     ICD-10-CM   1. Asymptomatic carotid artery stenosis, bilateral  I65.23 PCV CAROTID DUPLEX (BILATERAL)  2. Left subclavian artery occlusion  I70.8 PCV CAROTID DUPLEX (BILATERAL)  3. Hypercholesterolemia  E78.00 LDL cholesterol, direct    Lipid Panel With LDL/HDL Ratio  4. Essential hypertension  I10 EKG 12-Lead  5. Type 2 diabetes mellitus with stage 3a chronic kidney disease, with long-term current use of insulin (HCC)  E11.22    N18.31    Z79.4   6. History of myasthenia gravis  Z86.69     Orders Placed This Encounter  Procedures  . LDL cholesterol, direct  . Lipid Panel With LDL/HDL Ratio  . EKG 12-Lead  No orders of the defined types were placed in this encounter.  Medications Discontinued During This Encounter  Medication Reason  . levothyroxine (SYNTHROID) 137 MCG tablet Error  . Levothyroxine Sodium 137 MCG CAPS Error  . mometasone (NASONEX) 50 MCG/ACT nasal spray Error     Recommendations:   [name]  is a 70 y.o. African-American female  with  asymptomatic carotid stenosis, uncontrolled DM with stage 3 CKD and diabetic retinopathy,  hypertension, hyperlipidemia, exertional dyspnea and chest pain,  thyroid nodules, follows Dr. Chalmers Cater, myasthenia gravis follows [facility] with immunotherapy, and GERD. She has chronic fatigue, is being actively treated at [facility] for myasthenia gravis with immunotherapy. Anemia of chronic disease, with normal iron studies, low normal B12 on 04/14/2019.  I seen last seen her on 06/22/2019, she has not had any further vascular follow-up and also follow-up for ongoing CV risk factors.  In the interim she has developed diabetic retinopathy since I last saw her and also has had a stroke in March 2021.  I reviewed her labs and also external records, extensive evaluation of records, patient has uncontrolled lipids, she has been compliant with Crestor, would like to repeat lipid profile testing.  With regard to stroke and carotid disease, she is long overdue for carotid duplex.  She has peripheral arterial disease and by CTA has occluded left subclavian artery however completely asymptomatic with regard to this without claudication or vertebral steal phenomena and pulses in the upper extremity are equal.  In view of her cardiovascular risk, she needs very aggressive risk factor modification.  Again discussed with her regarding weight loss and calorie reduction.  I would like to see her back in 4 to 6 weeks to follow-up on her labs and carotid duplex.  This was a 40-minute office visit encounter.    Adrian Prows, MD, [facility] 01/01/2021, 8:49 PM Office: [phone] Pager: [phone]

## 2021-01-02 LAB — LIPID PANEL WITH LDL/HDL RATIO
Cholesterol, Total: 234 mg/dL — ABNORMAL HIGH (ref 100–199)
HDL: 74 mg/dL (ref >39)
LDL Chol Calc (NIH): 133 mg/dL — ABNORMAL HIGH (ref 0–99)
LDL/HDL Ratio: 1.8 ratio (ref 0.0–3.2)
Triglycerides: 157 mg/dL — ABNORMAL HIGH (ref 0–149)
VLDL Cholesterol Cal: 27 mg/dL (ref 5–40)

## 2021-01-02 LAB — LDL CHOLESTEROL, DIRECT: LDL Direct: 140 mg/dL — ABNORMAL HIGH (ref 0–99)

## 2021-01-02 NOTE — Progress Notes (Signed)
Markedly abnormal LDL in spite of being on Crestor, will need to consider addition of PCSK9 inhibitors along with statin and she is already on high-dose Crestor and Zetia in view of extreme high risk.  We will discuss on her next office visit.

## 2021-01-07 ENCOUNTER — Ambulatory Visit: Payer: Medicare Other

## 2021-01-07 ENCOUNTER — Other Ambulatory Visit: Payer: Self-pay

## 2021-01-07 DIAGNOSIS — I6523 Occlusion and stenosis of bilateral carotid arteries: Secondary | ICD-10-CM | POA: Diagnosis not present

## 2021-01-07 DIAGNOSIS — I708 Atherosclerosis of other arteries: Secondary | ICD-10-CM

## 2021-01-08 NOTE — Progress Notes (Signed)
Carotid artery duplex 01/07/2021: Stenosis in the right internal carotid artery (>=70%). Peak velocity 329/75 cm/S. The right PSV internal/common carotid artery ratio of 4.27 is consistent with a stenosis of >70%. 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 Stenosis in the left external carotid artery (<50%). Antegrade right vertebral artery flow. Antegrade left vertebral artery flow. Follow up in six months is appropriate if clinically indicated. Compared to the study done on  12/11/2019, no significant change.

## 2021-01-16 ENCOUNTER — Other Ambulatory Visit: Payer: Self-pay | Admitting: Cardiology

## 2021-01-20 DIAGNOSIS — E119 Type 2 diabetes mellitus without complications: Secondary | ICD-10-CM | POA: Diagnosis not present

## 2021-01-20 DIAGNOSIS — Z794 Long term (current) use of insulin: Secondary | ICD-10-CM | POA: Diagnosis not present

## 2021-01-30 DIAGNOSIS — R229 Localized swelling, mass and lump, unspecified: Secondary | ICD-10-CM | POA: Diagnosis not present

## 2021-02-13 ENCOUNTER — Other Ambulatory Visit: Payer: Self-pay

## 2021-02-13 ENCOUNTER — Encounter: Payer: Self-pay | Admitting: Cardiology

## 2021-02-13 ENCOUNTER — Ambulatory Visit: Payer: Medicare Other | Admitting: Cardiology

## 2021-02-13 VITALS — BP 140/80 | HR 113 | Temp 98.4°F | Resp 16 | Ht 63.0 in | Wt 194.8 lb

## 2021-02-13 DIAGNOSIS — I10 Essential (primary) hypertension: Secondary | ICD-10-CM | POA: Diagnosis not present

## 2021-02-13 DIAGNOSIS — E7800 Pure hypercholesterolemia, unspecified: Secondary | ICD-10-CM | POA: Diagnosis not present

## 2021-02-13 DIAGNOSIS — I6523 Occlusion and stenosis of bilateral carotid arteries: Secondary | ICD-10-CM | POA: Diagnosis not present

## 2021-02-13 MED ORDER — ROSUVASTATIN CALCIUM 40 MG PO TABS: 40.0000 mg | ORAL_TABLET | Freq: Every day | ORAL | 3 refills | Status: DC

## 2021-02-13 NOTE — Progress Notes (Addendum)
Primary Physician/Referring:  [name], DO  Patient ID: [name], female    DOB: 12-21-1950, 70 y.o.   MRN: [id number]  Chief Complaint  Patient presents with   Hyperlipidemia   Carotid stenosis   Follow-up    4 weeks   HPI:    [name]  is a 70 y.o. African-American female  with  asymptomatic carotid stenosis, uncontrolled DM with stage 3 CKD and diabetic retinopathy,, hypertension, hyperlipidemia, dyspnea on exertion,  thyroid nodules, follows Dr. 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   I had seen her 6 weeks ago after she had seen me 2 years previously.  Her medications were not appropriate, LDL was markedly elevated and I felt that she was on Crestor 40 mg daily along with Zetia.  Hence I had made her come back with medications to reconcile and also for follow-up of hypertension and hyperlipidemia to consider addition of PCSK9 inhibitors in view of extreme high risk.   Past Medical History:  Diagnosis Date   Arthritis    "all over my body" (03/13/2013)   Asthma    Asymptomatic carotid artery stenosis, bilateral 10/08/2018   GERD (gastroesophageal reflux disease)    H/O hiatal hernia    Heart murmur    Hypercholesterolemia 10/08/2018   Hypertension    Hypothyroidism    Myasthenia gravis ([place])    "in my eyes; diagnsosed > 7 yr ago" (03/13/2013)   Sleep apnea    on CPAP   Thyroid carcinoma ([place])    Type II diabetes mellitus ([place])    Past Surgical History:  Procedure Laterality Date   ABDOMINAL HYSTERECTOMY     ANTERIOR CERVICAL DECOMP/DISCECTOMY FUSION     "I've had severa ORs; always went in from the front" (03/13/2013)   [place]     "several" (03/13/2013)   CARPAL TUNNEL RELEASE Right    CATARACT EXTRACTION W/ INTRAOCULAR LENS  IMPLANT, BILATERAL  Bilateral    CHOLECYSTECTOMY     KNEE ARTHROSCOPY Left    SHOULDER ARTHROSCOPY W/ ROTATOR CUFF REPAIR Left    TONSILLECTOMY     TOTAL THYROIDECTOMY     Social History   Socioeconomic History   Marital status: Single    Spouse name: Not on file   Number of children: 0   Years of education: college   Highest education level: Not on file  Occupational History   Occupation: retired  Tobacco Use   Smoking status: Never   Smokeless tobacco: Never  Vaping Use   Vaping Use: Never used  Substance and Sexual Activity   Alcohol use: No    Alcohol/week: 0.0 standard drinks   Drug use: No   Sexual activity: Never  Other Topics Concern   Not on file  Social History Narrative   Lives alone   Right handed   Drinks no caffeine   Social Determinants of Health   Financial Resource Strain: Not on file  Food Insecurity: Not on file  Transportation Needs: Not on file  Physical Activity: Not on file  Stress: Not on file  Social Connections: Not on file  Intimate Partner Violence: Not on file   ROS  Review of Systems  Cardiovascular:  Negative for chest pain, dyspnea on exertion and leg swelling.  Musculoskeletal:  Positive for back pain and muscle weakness.  Gastrointestinal:  Negative for melena.  Neurological:  Positive for focal weakness (right arm and leg), loss of balance, numbness (right arm) and weakness.  Objective  Blood pressure 140/80, pulse (!) 113, temperature 98.4 F (36.9 C), temperature source Temporal, resp. rate 16, height '5\' 3"'  (1.6 m), weight 194 lb 12.8 oz (88.4 kg), SpO2 94 %. Body mass index is 34.51 kg/m.  Vitals with BMI 02/18/2021 02/13/2021 01/01/2021  Height '5\' 3"'  '5\' 3"'  '5\' 3"'   Weight 198 lbs 10 oz 194 lbs 13 oz 196 lbs  BMI 35.19 93.71 69.67  Systolic 893 810 175  Diastolic 70 80 75  Pulse 98 113 95      Physical Exam Constitutional:      General: She is not in acute distress.    Appearance: She is well-developed. She is obese.     Comments: Mildly  obese  HENT:     Head: Atraumatic.  Eyes:     Conjunctiva/sclera: Conjunctivae normal.  Neck:     Thyroid: No thyromegaly.     Vascular: Carotid bruit (right carotid bruit) present. No JVD.  Cardiovascular:     Rate and Rhythm: Normal rate and regular rhythm.     Pulses:          Radial pulses are 2+ on the right side and 2+ on the left side.       Femoral pulses are 2+ on the right side.      Popliteal pulses are 0 on the right side and 0 on the left side.       Dorsalis pedis pulses are 1+ on the right side and 1+ on the left side.       Posterior tibial pulses are 0 on the right side and 0 on the left side.     Heart sounds: Normal heart sounds. No murmur heard.   No gallop.     Comments: popliteal pulse difficult to feel due to patient's body habitus.  Pulmonary:     Effort: Pulmonary effort is normal.     Breath sounds: Normal breath sounds.  Abdominal:     General: Bowel sounds are normal.     Palpations: Abdomen is soft.  Musculoskeletal:        General: Normal range of motion.     Cervical back: Neck supple.  Skin:    General: Skin is warm and dry.  Neurological:     Mental Status: She is alert.   Radiology: No results found.  Laboratory examination:   No results for input(s): NA, K, CL, CO2, GLUCOSE, BUN, CREATININE, CALCIUM, GFRNONAA, GFRAA in the last 8760 hours. CMP Latest Ref Rng & Units 12/28/2019 12/23/2019 12/19/2019  Glucose 70 - 99 mg/dL 308(H) 467(H) 124(H)  BUN 8 - 23 mg/dL 20 - 20  Creatinine 0.44 - 1.00 mg/dL 0.83 - 0.69  Sodium 135 - 145 mmol/L 137 - 140  Potassium 3.5 - 5.1 mmol/L 3.8 - 3.3(L)  Chloride 98 - 111 mmol/L 109 - 106  CO2 22 - 32 mmol/L 21(L) - 24  Calcium 8.9 - 10.3 mg/dL 9.0 - 10.1  Total Protein 6.5 - 8.1 g/dL 6.2(L) - 7.8  Total Bilirubin 0.3 - 1.2 mg/dL 0.4 - 0.5  Alkaline Phos 38 - 126 U/L 89 - 98  AST 15 - 41 U/L 38 - 22  ALT 0 - 44 U/L 33 - 24   CBC Latest Ref Rng & Units 12/28/2019 12/19/2019 03/14/2013  WBC 4.0 - 10.5 K/uL  6.6 6.7 6.2  Hemoglobin 12.0 - 15.0 g/dL 10.0(L) 11.8(L) 10.0(L)  Hematocrit 36.0 - 46.0 % 31.7(L) 36.2 29.2(L)  Platelets 150 - 400 K/uL 254 261 196   Lipid Panel Recent Labs    01/01/21 1202 03/14/21 0821  CHOL 234* 161  TRIG 157* 118  LDLCALC 133* 91  HDL 74 49  LDLDIRECT 140*  --      Lipid Panel     Component Value Date/Time   CHOL 161 03/14/2021 0821   TRIG 118 03/14/2021 0821   HDL 49 03/14/2021 0821   CHOLHDL 4.3 12/21/2019 0618   VLDL 47 (H) 12/21/2019 0618   LDLCALC 91 03/14/2021 0821   LDLDIRECT 140 (H) 01/01/2021 1202   HEMOGLOBIN A1C Lab Results  Component Value Date   HGBA1C 9.1 (H) 12/20/2019   MPG 214.47 12/20/2019   External labs:  Labs 12/26/2020:  Hb 10.3/HCT 32.4, platelets 209, normal indicis.  Sodium 135, potassium 3.8, BUN 34, creatinine 1.1, EGFR 55 mL.  Serum glucose 327 mg.  CMP otherwise normal.  TSH normal at 0.33.  A1c 9.0%.  Vitamin D 36.1.  Labs 02/05/2020:  Total cholesterol 272, triglycerides 203, HDL 77, LDL 159.  Medications   Current Outpatient Medications on File Prior to Visit  Medication Sig Dispense Refill   acetaminophen (TYLENOL) 325 MG tablet Take 2 tablets (650 mg total) by mouth every 4 (four) hours as needed for mild pain (or temp > 37.5 C (99.5 F)).     aspirin EC 81 MG EC tablet Take 1 tablet (81 mg total) by mouth daily.     B-D ULTRAFINE III SHORT PEN 31G X 8 MM MISC      Biotin 5000 MCG CAPS Take 5,000 mcg by mouth daily with breakfast.      BYDUREON BCISE 2 MG/0.85ML AUIJ Inject 2 mg into the skin every Monday.      canagliflozin (INVOKANA) 100 MG TABS tablet Take 1 tablet (100 mg total) by mouth daily before breakfast. (Patient taking differently: Take 300 mg by mouth daily before breakfast.) 30 tablet 0   Continuous Blood Gluc Receiver (FREESTYLE LIBRE 14 DAY READER) DEVI 1 Device by Does not apply route as directed.      Continuous Blood Gluc Sensor (FREESTYLE LIBRE 14 DAY SENSOR) MISC Inject 1 patch  into the skin every 14 (fourteen) days.     cycloSPORINE (RESTASIS) 0.05 % ophthalmic emulsion Place 1 drop into both eyes 2 (two) times daily. 0.4 mL 0   diclofenac Sodium (VOLTAREN) 1 % GEL Apply 2 g topically 4 (four) times daily. 2 g 0   esomeprazole (NEXIUM) 40 MG capsule Take 1 capsule (40 mg total) by mouth 2 (two) times daily. 60 capsule 0   ezetimibe (ZETIA) 10 MG tablet TAKE 1 TABLET BY MOUTH EVERY DAY 90 tablet 1   furosemide (LASIX) 40 MG tablet Take 40 mg by mouth daily as needed.     GAMUNEX-C 40 GM/400ML SOLN q 6 weeks     Insulin Disposable Pump (OMNIPOD DASH 5 PACK PODS) MISC USE UNDER THE SKIN AS DIRECTED EVERY 1 TO 2 DAYS     insulin lispro (HUMALOG) 100 UNIT/ML cartridge Inject into the skin 3 (three) times daily with meals. On sliding scale     levothyroxine (SYNTHROID) 175 MCG tablet Take 175 mcg by mouth daily before breakfast.     lidocaine (LIDODERM) 5 % Place 1 patch onto the skin daily. Remove & Discard patch within 12 hours or as directed by MD 30 patch 0  loratadine (CLARITIN) 10 MG tablet Take 1 tablet (10 mg total) by mouth at bedtime. 30 tablet 0   meclizine (ANTIVERT) 25 MG tablet Take 25 mg by mouth 3 (three) times daily as needed for dizziness (or migraine-related nausea).      montelukast (SINGULAIR) 10 MG tablet Take 1 tablet (10 mg total) by mouth every morning. 30 tablet 0   mycophenolate (CELLCEPT) 500 MG tablet Take 1,500 mg by mouth in the morning and at bedtime.      olmesartan (BENICAR) 20 MG tablet Take 1 tablet (20 mg total) by mouth daily. 30 tablet 0   predniSONE (DELTASONE) 10 MG tablet Take 10 mg by mouth every morning.     PRESCRIPTION MEDICATION See admin instructions. CPAP- At bedtime     PROAIR HFA 108 (90 BASE) MCG/ACT inhaler Inhale 2 puffs into the lungs every 6 (six) hours as needed for wheezing or shortness of breath.      riTUXimab (RITUXAN) 500 MG/50ML injection Inject 1,000 mg into the vein See admin instructions. 1,000 mg via IV, then  another 1,000 mg in two weeks- repeat after 6 months: "riTUXimab (RITUXAN) 1,000 mg in sodium chloride 0.9% 1,000 mL infusion- Begin infusion at 50 mL/hr, if no hypersensitivity or infusion-related events occur, may increase by 50 mL/hr every 30 minutes to a maximum infusion rate of 400 mL/hr"     spironolactone (ALDACTONE) 25 MG tablet Take 1 tablet (25 mg total) by mouth daily. 30 tablet 0   vitamin B-12 (CYANOCOBALAMIN) 1000 MCG tablet Take 1,000 mcg by mouth daily.     Vitamin D, Ergocalciferol, (DRISDOL) 50000 UNITS CAPS capsule Take 50,000 Units by mouth every Monday.      No current facility-administered medications on file prior to visit.    Radiology:   MR angio of the head 12/21/2019: Atherosclerotic disease right middle cerebral artery branch otherwise negative. No large vessel occlusion.  MRA chest with and without contrast 12/20/2019: 1. No evidence of acute aortic dissection, aortic aneurysm or other acute vascular abnormality. 2. 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 3. Heterogeneous and irregular/ulcerated atherosclerotic plaque along the descending thoracic aorta. 4. Concentric hypertrophy of the left ventricular myocardium suggests chronic systolic hypertension.  MRI of the brain 11/09/2020: 1. No acute intracranial abnormality. Expected evolution of the left thalamic lacunar infarct seen last year and otherwise normal for age noncontrast MRI appearance of the brain. 2. Prior cervical ACDF with adjacent segment disease resulting in significant spinal stenosis at C2-C3. Query myelopathic symptoms. Dedicated Cervical Spine MRI would be valuable.  Cardiac Studies:   Lexiscan myoview stress test 06/25/2017: 1. Resting EKG NSR. Stress EKG: Non diagnostic for ischemia due to pharmacologic stress testing. No ST-T changes of ischemia. 8/10 chest pain with Lexiscan infusion. 2. Myocardial perfusion  study was normal, without evidence of ischemia, ejection fraction 70%. Low risk study.  Echocardiogram 12/20/2019:  1. Left ventricular ejection fraction, by estimation, is 60 to 65%. The left ventricle has normal function. The left ventricle has no regional wall motion abnormalities. Left ventricular diastolic parameters are consistent with Grade I diastolic dysfunction (impaired relaxation). 2. Right ventricular systolic function is normal. The right ventricular size is normal. 3. Left atrial size was moderately dilated. 4. The mitral valve is grossly normal. Trivial mitral valve regurgitation. No evidence of mitral stenosis. 5. Mild calcification of the non coronary cusp.. The aortic valve is tricuspid. Aortic valve regurgitation is not visualized. No aortic  stenosis is present. 6. The inferior vena cava is normal in size with greater than 50% respiratory variability,suggesting right atrial pressure of 3 mmHg.  Lower Extremity Venous Duplex  12/20/2019: RIGHT:  - There is no evidence of deep vein thrombosis in the lower extremity.  - No cystic structure found in the popliteal fossa.  LEFT:  - There is no evidence of deep vein thrombosis in the lower extremity.  - No cystic structure found in the popliteal fossa.   Carotid artery duplex  12/11/2019: Stenosis in the right internal carotid artery (>=70%). Peak velocity 295/100 cm/S. The right PSV internal/common carotid artery ratio of 4.14 is consistent with a stenosis of >70%. 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 Antegrade right vertebral artery flow. Antegrade left vertebral artery flow. Follow up in six months is appropriate if clinically indicated. Compared to the study done on 04/10/2019, right ICA stenosis has progressed.  EKG:   EKG 01/01/2021: Normal sinus rhythm at rate of 92 bpm, left atrial enlargement, normal axis.  Incomplete right bundle branch block.  Nonspecific T abnormality.    EKG 04/14/2019: Normal  sinus rhythm at rate of 98 bpm, normal axis.  No evidence of ischemia, normal EKG.  Assessment     ICD-10-CM   1. Hypercholesterolemia  E78.00 rosuvastatin (CRESTOR) 40 MG tablet    Lipid Panel With LDL/HDL Ratio    2. Asymptomatic carotid artery stenosis, bilateral  I65.23     3. Essential hypertension  I10       Orders Placed This Encounter  Procedures   Lipid Panel With LDL/HDL Ratio    Standing Status:   Future    Standing Expiration Date:   02/13/2022   Meds ordered this encounter  Medications   rosuvastatin (CRESTOR) 40 MG tablet    Sig: Take 1 tablet (40 mg total) by mouth daily.    Dispense:  90 tablet    Refill:  3    Discontinue Simvastatin   Medications Discontinued During This Encounter  Medication Reason   rosuvastatin (CRESTOR) 40 MG tablet Error   gabapentin (NEURONTIN) 300 MG capsule Error   simvastatin (ZOCOR) 40 MG tablet Change in therapy     Recommendations:   BABITA AMAKER  is a 70 y.o. African-American female  with  asymptomatic carotid stenosis, uncontrolled DM with stage 3 CKD and diabetic retinopathy,, hypertension, hyperlipidemia, dyspnea on exertion,  thyroid nodules, follows Dr. 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   I had seen her a month ago, was not sure what medication she was on, thought that she was on Crestor.  It turns out that she has been on simvastatin 40 mg.   As her lipids are not well controlled, I will discontinue simvastatin and switch her to rosuvastatin 40 mg daily and obtain lipids in 4 to 6 weeks.   Blood pressure has been well controlled until this morning.  She has monitored at home and will continue to pay close attention goal blood pressure 130/80 mmHg.   With regard to carotid artery stenosis, no change in severity of bilateral ICA stenosis.   We will continue to do 6 monthly surveillance and I will see her back at that time.   Adrian Prows, MD, [facility] 03/16/2021, 10:32 AM Office: [phone] Fax: [phone] Pager: [phone]

## 2021-02-13 NOTE — Progress Notes (Deleted)
Primary Physician/Referring:  [name], DO  Patient ID: [name], female    DOB: 1950-11-25, 70 y.o.   MRN: [id number]  Chief Complaint  Patient presents with   Hyperlipidemia   Carotid stenosis   Follow-up    4 weeks   HPI:    [name]  is a 70 y.o. African-American female  with  asymptomatic carotid stenosis, uncontrolled DM with stage 3 CKD and diabetic retinopathy,, hypertension, hyperlipidemia, dyspnea on exertion,  thyroid nodules, follows Dr. 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  I had seen her 6 weeks ago after she had seen me 2 years previously.  Her medications were not appropriate, LDL was markedly elevated and I felt that she was on Crestor 40 mg daily along with Zetia.  Hence I had made her come back with medications to reconcile and also for follow-up of hypertension and hyperlipidemia.  No new symptoms.  Past Medical History:  Diagnosis Date   Arthritis    "all over my body" (03/13/2013)   Asthma    Asymptomatic carotid artery stenosis, bilateral 10/08/2018   GERD (gastroesophageal reflux disease)    H/O hiatal hernia    Heart murmur    Hypercholesterolemia 10/08/2018   Hypertension    Hypothyroidism    Myasthenia gravis ([place])    "in my eyes; diagnsosed > 7 yr ago" (03/13/2013)   Sleep apnea    on CPAP   Thyroid carcinoma ([place])    Type II diabetes mellitus ([place])    Past Surgical History:  Procedure Laterality Date   ABDOMINAL HYSTERECTOMY     ANTERIOR CERVICAL DECOMP/DISCECTOMY FUSION     "I've had severa ORs; always went in from the front" (03/13/2013)   [place]     "several" (03/13/2013)   CARPAL TUNNEL RELEASE Right    CATARACT EXTRACTION W/ INTRAOCULAR LENS  IMPLANT, BILATERAL Bilateral    CHOLECYSTECTOMY     KNEE ARTHROSCOPY  Left    SHOULDER ARTHROSCOPY W/ ROTATOR CUFF REPAIR Left    TONSILLECTOMY     TOTAL THYROIDECTOMY     Social History   Socioeconomic History   Marital status: Single    Spouse name: Not on file   Number of children: 0   Years of education: college   Highest education level: Not on file  Occupational History   Occupation: retired  Tobacco Use   Smoking status: Never   Smokeless tobacco: Never  Vaping Use   Vaping Use: Never used  Substance and Sexual Activity   Alcohol use: No    Alcohol/week: 0.0 standard drinks   Drug use: No   Sexual activity: Never  Other Topics Concern   Not on file  Social History Narrative   Lives alone   Right handed   Drinks no caffeine   Social Determinants of Health   Financial Resource Strain: Not on file  Food Insecurity: Not on file  Transportation Needs: Not on file  Physical Activity: Not on file  Stress: Not on file  Social Connections: Not on file  Intimate Partner Violence: Not on file   ROS  Review of Systems  Cardiovascular:  Negative for chest pain, dyspnea on exertion and leg swelling.  Musculoskeletal:  Positive for back pain and muscle weakness.  Gastrointestinal:  Negative for melena.  Neurological:  Positive for  focal weakness (right arm and leg), loss of balance, numbness (right arm) and weakness.   Objective  Blood pressure 140/80, pulse (!) 113, temperature 98.4 F (36.9 C), temperature source Temporal, resp. rate 16, height '5\' 3"'  (1.6 m), weight 194 lb 12.8 oz (88.4 kg), SpO2 94 %. Body mass index is 34.51 kg/m.   Vitals with BMI 02/13/2021 01/01/2021 10/29/2020  Height '5\' 3"'  '5\' 3"'  '5\' 3"'   Weight 194 lbs 13 oz 196 lbs 190 lbs  BMI 34.52 45.80 99.83  Systolic 382 505 397  Diastolic 80 75 76  Pulse 673 95 90      Physical Exam Constitutional:      General: She is not in acute distress.    Appearance: She is well-developed. She is obese.     Comments: Mildly obese  HENT:     Head: Atraumatic.  Eyes:      Conjunctiva/sclera: Conjunctivae normal.  Neck:     Thyroid: No thyromegaly.     Vascular: Carotid bruit (right carotid bruit) present. No JVD.  Cardiovascular:     Rate and Rhythm: Normal rate and regular rhythm.     Pulses:          Radial pulses are 2+ on the right side and 2+ on the left side.       Femoral pulses are 2+ on the right side.      Popliteal pulses are 0 on the right side and 0 on the left side.       Dorsalis pedis pulses are 1+ on the right side and 1+ on the left side.       Posterior tibial pulses are 0 on the right side and 0 on the left side.     Heart sounds: Normal heart sounds. No murmur heard.   No gallop.     Comments: popliteal pulse difficult to feel due to patient's body habitus.  Pulmonary:     Effort: Pulmonary effort is normal.     Breath sounds: Normal breath sounds.  Abdominal:     General: Bowel sounds are normal.     Palpations: Abdomen is soft.  Musculoskeletal:        General: Normal range of motion.     Cervical back: Neck supple.  Skin:    General: Skin is warm and dry.  Neurological:     Mental Status: She is alert.   Radiology: No results found.  Laboratory examination:   No results for input(s): NA, K, CL, CO2, GLUCOSE, BUN, CREATININE, CALCIUM, GFRNONAA, GFRAA in the last 8760 hours. CMP Latest Ref Rng & Units 12/28/2019 12/23/2019 12/19/2019  Glucose 70 - 99 mg/dL 308(H) 467(H) 124(H)  BUN 8 - 23 mg/dL 20 - 20  Creatinine 0.44 - 1.00 mg/dL 0.83 - 0.69  Sodium 135 - 145 mmol/L 137 - 140  Potassium 3.5 - 5.1 mmol/L 3.8 - 3.3(L)  Chloride 98 - 111 mmol/L 109 - 106  CO2 22 - 32 mmol/L 21(L) - 24  Calcium 8.9 - 10.3 mg/dL 9.0 - 10.1  Total Protein 6.5 - 8.1 g/dL 6.2(L) - 7.8  Total Bilirubin 0.3 - 1.2 mg/dL 0.4 - 0.5  Alkaline Phos 38 - 126 U/L 89 - 98  AST 15 - 41 U/L 38 - 22  ALT 0 - 44 U/L 33 - 24   CBC Latest Ref Rng & Units 12/28/2019 12/19/2019 03/14/2013  WBC 4.0 - 10.5 K/uL 6.6 6.7 6.2  Hemoglobin 12.0 - 15.0 g/dL 10.0(L)  11.8(L) 10.0(L)  Hematocrit 36.0 - 46.0 % 31.7(L) 36.2 29.2(L)  Platelets 150 - 400 K/uL 254 261 196   Lipid Panel Recent Labs    01/01/21 1202  CHOL 234*  TRIG 157*  LDLCALC 133*  HDL 74  LDLDIRECT 140*     Lipid Panel     Component Value Date/Time   CHOL 234 (H) 01/01/2021 1202   TRIG 157 (H) 01/01/2021 1202   HDL 74 01/01/2021 1202   CHOLHDL 4.3 12/21/2019 0618   VLDL 47 (H) 12/21/2019 0618   LDLCALC 133 (H) 01/01/2021 1202   LDLDIRECT 140 (H) 01/01/2021 1202   HEMOGLOBIN A1C Lab Results  Component Value Date   HGBA1C 9.1 (H) 12/20/2019   MPG 214.47 12/20/2019   External labs:  Labs 12/26/2020:  Hb 10.3/HCT 32.4, platelets 209, normal indicis.  Sodium 135, potassium 3.8, BUN 34, creatinine 1.1, EGFR 55 mL.  Serum glucose 327 mg.  CMP otherwise normal.  TSH normal at 0.33.  A1c 9.0%.  Vitamin D 36.1.  Labs 02/05/2020:  Total cholesterol 272, triglycerides 203, HDL 77, LDL 159.  Medications   Current Outpatient Medications on File Prior to Visit  Medication Sig Dispense Refill   acetaminophen (TYLENOL) 325 MG tablet Take 2 tablets (650 mg total) by mouth every 4 (four) hours as needed for mild pain (or temp > 37.5 C (99.5 F)).     aspirin EC 81 MG EC tablet Take 1 tablet (81 mg total) by mouth daily.     B-D ULTRAFINE III SHORT PEN 31G X 8 MM MISC      Biotin 5000 MCG CAPS Take 5,000 mcg by mouth daily with breakfast.      BYDUREON BCISE 2 MG/0.85ML AUIJ Inject 2 mg into the skin every Monday.      canagliflozin (INVOKANA) 100 MG TABS tablet Take 1 tablet (100 mg total) by mouth daily before breakfast. (Patient taking differently: Take 300 mg by mouth daily before breakfast.) 30 tablet 0   Continuous Blood Gluc Receiver (FREESTYLE LIBRE 14 DAY READER) DEVI 1 Device by Does not apply route as directed.      Continuous Blood Gluc Sensor (FREESTYLE LIBRE 14 DAY SENSOR) MISC Inject 1 patch into the skin every 14 (fourteen) days.     cycloSPORINE (RESTASIS)  0.05 % ophthalmic emulsion Place 1 drop into both eyes 2 (two) times daily. 0.4 mL 0   diclofenac Sodium (VOLTAREN) 1 % GEL Apply 2 g topically 4 (four) times daily. 2 g 0   esomeprazole (NEXIUM) 40 MG capsule Take 1 capsule (40 mg total) by mouth 2 (two) times daily. 60 capsule 0   ezetimibe (ZETIA) 10 MG tablet TAKE 1 TABLET BY MOUTH EVERY DAY 90 tablet 1   furosemide (LASIX) 40 MG tablet Take 40 mg by mouth daily as needed.     GAMUNEX-C 40 GM/400ML SOLN q 6 weeks     Insulin Disposable Pump (OMNIPOD DASH 5 PACK PODS) MISC USE UNDER THE SKIN AS DIRECTED EVERY 1 TO 2 DAYS     insulin lispro (HUMALOG) 100 UNIT/ML cartridge Inject into the skin 3 (three) times daily with meals. On sliding scale     levothyroxine (SYNTHROID) 175 MCG tablet Take 175 mcg by mouth daily before breakfast.     lidocaine (LIDODERM) 5 % Place 1 patch onto the skin daily. Remove & Discard patch within 12 hours or as directed by MD 30 patch 0   loratadine (CLARITIN) 10 MG tablet Take 1 tablet (10 mg total) by mouth  at bedtime. 30 tablet 0   meclizine (ANTIVERT) 25 MG tablet Take 25 mg by mouth 3 (three) times daily as needed for dizziness (or migraine-related nausea).      montelukast (SINGULAIR) 10 MG tablet Take 1 tablet (10 mg total) by mouth every morning. 30 tablet 0   mycophenolate (CELLCEPT) 500 MG tablet Take 1,500 mg by mouth in the morning and at bedtime.      olmesartan (BENICAR) 20 MG tablet Take 1 tablet (20 mg total) by mouth daily. 30 tablet 0   predniSONE (DELTASONE) 10 MG tablet Take 10 mg by mouth every morning.     PRESCRIPTION MEDICATION See admin instructions. CPAP- At bedtime     PROAIR HFA 108 (90 BASE) MCG/ACT inhaler Inhale 2 puffs into the lungs every 6 (six) hours as needed for wheezing or shortness of breath.      riTUXimab (RITUXAN) 500 MG/50ML injection Inject 1,000 mg into the vein See admin instructions. 1,000 mg via IV, then another 1,000 mg in two weeks- repeat after 6 months: "riTUXimab  (RITUXAN) 1,000 mg in sodium chloride 0.9% 1,000 mL infusion- Begin infusion at 50 mL/hr, if no hypersensitivity or infusion-related events occur, may increase by 50 mL/hr every 30 minutes to a maximum infusion rate of 400 mL/hr"     spironolactone (ALDACTONE) 25 MG tablet Take 1 tablet (25 mg total) by mouth daily. 30 tablet 0   topiramate (TOPAMAX) 50 MG tablet Take 1 tablet (50 mg total) by mouth 2 (two) times daily. Start 1 tablet daily x 1 week and then twice daily (Patient taking differently: Take 50 mg by mouth as needed. Start 1 tablet daily x 1 week and then twice daily) 60 tablet 3   vitamin B-12 (CYANOCOBALAMIN) 1000 MCG tablet Take 1,000 mcg by mouth daily.     Vitamin D, Ergocalciferol, (DRISDOL) 50000 UNITS CAPS capsule Take 50,000 Units by mouth every Monday.      No current facility-administered medications on file prior to visit.    Radiology:   MR angio of the head 12/21/2019: Atherosclerotic disease right middle cerebral artery branch otherwise negative. No large vessel occlusion.  MRA chest with and without contrast 12/20/2019: 1. No evidence of acute aortic dissection, aortic aneurysm or other acute vascular abnormality. 2. 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 3. Heterogeneous and irregular/ulcerated atherosclerotic plaque along the descending thoracic aorta. 4. Concentric hypertrophy of the left ventricular myocardium suggests chronic systolic hypertension.  MRI of the brain 11/09/2020: 1. No acute intracranial abnormality. Expected evolution of the left thalamic lacunar infarct seen last year and otherwise normal for age noncontrast MRI appearance of the brain. 2. Prior cervical ACDF with adjacent segment disease resulting in significant spinal stenosis at C2-C3. Query myelopathic symptoms. Dedicated Cervical Spine MRI would be valuable.  Cardiac Studies:   Lexiscan myoview stress  test 06/25/2017: 1. Resting EKG NSR. Stress EKG: Non diagnostic for ischemia due to pharmacologic stress testing. No ST-T changes of ischemia. 8/10 chest pain with Lexiscan infusion. 2. Myocardial perfusion study was normal, without evidence of ischemia, ejection fraction 70%. Low risk study.  Echocardiogram 12/20/2019:  1. Left ventricular ejection fraction, by estimation, is 60 to 65%. The left ventricle has normal function. The left ventricle has no regional wall motion abnormalities. Left ventricular diastolic parameters are consistent with Grade I diastolic dysfunction (impaired relaxation). 2. Right ventricular systolic function is normal. The right ventricular size is normal. 3. Left atrial  size was moderately dilated. 4. The mitral valve is grossly normal. Trivial mitral valve regurgitation. No evidence of mitral stenosis. 5. Mild calcification of the non coronary cusp.. The aortic valve is tricuspid. Aortic valve regurgitation is not visualized. No aortic stenosis is present. 6. The inferior vena cava is normal in size with greater than 50% respiratory variability,suggesting right atrial pressure of 3 mmHg.  Lower Extremity Venous Duplex  12/20/2019: RIGHT:  - There is no evidence of deep vein thrombosis in the lower extremity.  - No cystic structure found in the popliteal fossa.  LEFT:  - There is no evidence of deep vein thrombosis in the lower extremity.  - No cystic structure found in the popliteal fossa.   Carotid artery duplex 01/07/2021: Stenosis in the right internal carotid artery (>=70%). Peak velocity 329/75 cm/S. The right PSV internal/common carotid artery ratio of 4.27 is consistentwith a stenosis of >70%. 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 Stenosis in the leftexternal carotid artery (<50%). Antegrade right vertebral artery flow. Antegrade left vertebral artery flow. Follow up in six months is appropriate if clinically indicated. Compared to thestudy  done on  12/11/2019, no significant change.  EKG:   EKG 01/01/2021: Normal sinus rhythm at rate of 92 bpm, left atrial enlargement, normal axis.  Incomplete right bundle branch block.  Nonspecific T abnormality.    EKG 04/14/2019: Normal sinus rhythm at rate of 98 bpm, normal axis.  No evidence of ischemia, normal EKG.  Assessment     ICD-10-CM   1. Hypercholesterolemia  E78.00 rosuvastatin (CRESTOR) 40 MG tablet    Lipid Panel With LDL/HDL Ratio    2. Asymptomatic carotid artery stenosis, bilateral  I65.23     3. Essential hypertension  I10        Orders Placed This Encounter  Procedures   Lipid Panel With LDL/HDL Ratio    Standing Status:   Future    Standing Expiration Date:   02/13/2022   Meds ordered this encounter  Medications   rosuvastatin (CRESTOR) 40 MG tablet    Sig: Take 1 tablet (40 mg total) by mouth daily.    Dispense:  90 tablet    Refill:  3    Discontinue Simvastatin    Medications Discontinued During This Encounter  Medication Reason   rosuvastatin (CRESTOR) 40 MG tablet Error   gabapentin (NEURONTIN) 300 MG capsule Error   simvastatin (ZOCOR) 40 MG tablet Change in therapy    Recommendations:   MADALYN LEGNER  is a 70 y.o. African-American female  with  asymptomatic carotid stenosis, uncontrolled DM with stage 3 CKD and diabetic retinopathy,, hypertension, hyperlipidemia, dyspnea on exertion,  thyroid nodules, follows Dr. 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  I had seen her a month ago, was not sure what medication she was on, thought that she was on Crestor.  It turns out that she has been on simvastatin 40 mg.  As her lipids are not well controlled, I will discontinue simvastatin and switch her to rosuvastatin 40 mg daily and obtain lipids in 4 to 6 weeks.  Blood  pressure has been well controlled until this morning.  She has monitored at home and will continue to pay close attention goal blood pressure 130/80 mmHg.  With regard to carotid artery stenosis, no change in severity of bilateral ICA stenosis.  We will  continue to do 6 monthly surveillance and I will see her back at that time.    Adrian Prows, MD, [facility] 02/13/2021, 5:19 PM Office: [phone] Pager: [phone]

## 2021-02-17 DIAGNOSIS — H00024 Hordeolum internum left upper eyelid: Secondary | ICD-10-CM | POA: Diagnosis not present

## 2021-02-18 ENCOUNTER — Encounter: Payer: Self-pay | Admitting: Neurology

## 2021-02-18 ENCOUNTER — Ambulatory Visit: Payer: Medicare Other | Admitting: Neurology

## 2021-02-18 ENCOUNTER — Other Ambulatory Visit: Payer: Self-pay

## 2021-02-18 VITALS — BP 143/70 | HR 98 | Ht 63.0 in | Wt 198.6 lb

## 2021-02-18 DIAGNOSIS — G5601 Carpal tunnel syndrome, right upper limb: Secondary | ICD-10-CM

## 2021-02-18 DIAGNOSIS — G441 Vascular headache, not elsewhere classified: Secondary | ICD-10-CM

## 2021-02-18 DIAGNOSIS — R202 Paresthesia of skin: Secondary | ICD-10-CM | POA: Diagnosis not present

## 2021-02-18 MED ORDER — TOPIRAMATE 50 MG PO TABS: 50.0000 mg | ORAL_TABLET | Freq: Two times a day (BID) | ORAL | 3 refills | Status: DC

## 2021-02-18 NOTE — Progress Notes (Signed)
[facility] [address] [place]. [place] [address] [phone]       OFFICE FOLLOW UP VISIT NOTE  [name] Date of Birth:  Jan 01, 1951 Medical Record Number:  [id number]   Referring MD: [name]  Reason for Referral: Numbness and headache  HPI: Initial visit 10/29/2020 :[name] is a 70 year old African-American lady seen today for office consultation visit.  History is obtained from the patient, review of electronic medical records as well as care everywhere and I personally reviewed pertinent imaging films in PACS.  She has past medical history of hypertension, hyperlipidemia, obesity, asthma, hypothyroidism, seronegative ocular myasthenia, sleep apnea, thyroid carcinoma, diabetes.  Patient states that she has had new onset of numbness involving the right face for the last few months.  This is intermittent and can last for several minutes to hours and can occur at variable frequency couple of times a week to once every couple of weeks.  She is also noticed for the last several weeks headaches which is sharp shooting severe lasting few seconds involving mostly the frontal and occasionally occipital regions.  These also occur to 3 times a day.  She is been taking some Tylenol which seems to help.  There is occasional sweating with these headaches but she denies any tearing of her eyes or redness accompanying the headaches.  Patient has tried taking gabapentin for the numbness which has not helped.  She is presently on 300 mg 3 times daily which was recently increased.  She has actually history of left thalamic lacunar infarct in April 2021 at that time she did have some right face paresthesias and numbness which actually resolved several months after till they recurred again recently.  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  She has been following up with Dr. Nanine [name] neurologist at [facility] and is  presently on CellCept 1500 mg twice daily and prednisone 10 mg daily.  She still has some mild ptosis and intermittent diplopia but symptoms have been stable for quite some time.  She does have an appointment to see Dr. Nanine [name] in June.  She denies any recurrent stroke or TIA symptoms.  She is on aspirin which is tolerating well without bruising or bleeding.  She states her sugars are now better controlled and she is current only on insulin pump and uses her insulin sensor and has an upcoming appointment at the end of the month with endocrinologist Dr. Soyla [name] and will have lipid profile and A1c checked at that visit.  She states her blood pressure is usually runs pretty good though today it is elevated at 170/76 as she was late for the appointment and she could not find her office.  She denies any loss of vision with her recent new headaches or muscle aches or pains or jaw claudication or scalp tenderness. Update 02/18/2021: She returns for follow-up after last visit 3 months ago.  Patient states she has noticed some improvement in her headaches and numbness when she takes Topamax but she is not taking it on a scheduled basis and takes it only as needed.  Symptoms still persist but not as bothersome.  She has a new complaint of pain and swelling in the left eye and she has seen an ophthalmologist with diagnosed her with a stye.  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  LDL cholesterol on  01/01/2021 was 133 and Dr. Einar [name] increase the dose of Crestor which she now takes at 40 mg daily.  She also had carotid ultrasound on 01/07/2021 which showed 70% right ICA and 50 to 69% left ICA stenosis.  She also is complaining of right hand pain and is wearing a carpal tunnel splint.  She has been advised surgery but she is not happy with the surgeon and is thinking about a second opinion. ROS:   14 system review of systems is positive for  numbness, headache, left eye pain and eyelid swelling drooping of eyelids, diplopia, wrist pain and all other systems negative PMH:  Past Medical History:  Diagnosis Date   Arthritis    "all over my body" (03/13/2013)   Asthma    Asymptomatic carotid artery stenosis, bilateral 10/08/2018   GERD (gastroesophageal reflux disease)    H/O hiatal hernia    Heart murmur    Hypercholesterolemia 10/08/2018   Hypertension    Hypothyroidism    Myasthenia gravis ([place])    "in my eyes; diagnsosed > 7 yr ago" (03/13/2013)   Sleep apnea    on CPAP   Thyroid carcinoma (HCC)    Type II diabetes mellitus ([place])     Social History:  Social History   Socioeconomic History   Marital status: Single    Spouse name: Not on file   Number of children: 0   Years of education: college   Highest education level: Not on file  Occupational History   Occupation: retired  Tobacco Use   Smoking status: Never   Smokeless tobacco: Never  Vaping Use   Vaping Use: Never used  Substance and Sexual Activity   Alcohol use: No    Alcohol/week: 0.0 standard drinks   Drug use: No   Sexual activity: Never  Other Topics Concern   Not on file  Social History Narrative   Lives alone   Right handed   Drinks no caffeine   Social Determinants of Health   Financial Resource Strain: Not on file  Food Insecurity: Not on file  Transportation Needs: Not on file  Physical Activity: Not on file  Stress: Not on file  Social Connections: Not on file  Intimate Partner Violence: Not on file    Medications:   Current Outpatient Medications on File Prior to Visit  Medication Sig Dispense Refill   acetaminophen (TYLENOL) 325 MG tablet Take 2 tablets (650 mg total) by mouth every 4 (four) hours as needed for mild pain (or temp > 37.5 C (99.5 F)).     aspirin EC 81 MG EC tablet Take 1 tablet (81 mg total) by mouth daily.     B-D ULTRAFINE III SHORT PEN 31G X 8 MM MISC      Biotin 5000 MCG CAPS Take 5,000 mcg by mouth  daily with breakfast.      BYDUREON BCISE 2 MG/0.85ML AUIJ Inject 2 mg into the skin every Monday.      canagliflozin (INVOKANA) 100 MG TABS tablet Take 1 tablet (100 mg total) by mouth daily before breakfast. (Patient taking differently: Take 300 mg by mouth daily before breakfast.) 30 tablet 0   Continuous Blood Gluc Receiver (FREESTYLE LIBRE 14 DAY READER) DEVI 1 Device by Does not apply route as directed.      Continuous Blood Gluc Sensor (FREESTYLE LIBRE 14 DAY SENSOR) MISC Inject 1 patch into the skin every 14 (fourteen) days.     cycloSPORINE (RESTASIS) 0.05 % ophthalmic emulsion Place 1 drop into  both eyes 2 (two) times daily. 0.4 mL 0   diclofenac Sodium (VOLTAREN) 1 % GEL Apply 2 g topically 4 (four) times daily. 2 g 0   esomeprazole (NEXIUM) 40 MG capsule Take 1 capsule (40 mg total) by mouth 2 (two) times daily. 60 capsule 0   ezetimibe (ZETIA) 10 MG tablet TAKE 1 TABLET BY MOUTH EVERY DAY 90 tablet 1   furosemide (LASIX) 40 MG tablet Take 40 mg by mouth daily as needed.     GAMUNEX-C 40 GM/400ML SOLN q 6 weeks     Insulin Disposable Pump (OMNIPOD DASH 5 PACK PODS) MISC USE UNDER THE SKIN AS DIRECTED EVERY 1 TO 2 DAYS     insulin lispro (HUMALOG) 100 UNIT/ML cartridge Inject into the skin 3 (three) times daily with meals. On sliding scale     levothyroxine (SYNTHROID) 175 MCG tablet Take 175 mcg by mouth daily before breakfast.     lidocaine (LIDODERM) 5 % Place 1 patch onto the skin daily. Remove & Discard patch within 12 hours or as directed by MD 30 patch 0   loratadine (CLARITIN) 10 MG tablet Take 1 tablet (10 mg total) by mouth at bedtime. 30 tablet 0   meclizine (ANTIVERT) 25 MG tablet Take 25 mg by mouth 3 (three) times daily as needed for dizziness (or migraine-related nausea).      montelukast (SINGULAIR) 10 MG tablet Take 1 tablet (10 mg total) by mouth every morning. 30 tablet 0   mycophenolate (CELLCEPT) 500 MG tablet Take 1,500 mg by mouth in the morning and at bedtime.       olmesartan (BENICAR) 20 MG tablet Take 1 tablet (20 mg total) by mouth daily. 30 tablet 0   predniSONE (DELTASONE) 10 MG tablet Take 10 mg by mouth every morning.     PRESCRIPTION MEDICATION See admin instructions. CPAP- At bedtime     PROAIR HFA 108 (90 BASE) MCG/ACT inhaler Inhale 2 puffs into the lungs every 6 (six) hours as needed for wheezing or shortness of breath.      riTUXimab (RITUXAN) 500 MG/50ML injection Inject 1,000 mg into the vein See admin instructions. 1,000 mg via IV, then another 1,000 mg in two weeks- repeat after 6 months: "riTUXimab (RITUXAN) 1,000 mg in sodium chloride 0.9% 1,000 mL infusion- Begin infusion at 50 mL/hr, if no hypersensitivity or infusion-related events occur, may increase by 50 mL/hr every 30 minutes to a maximum infusion rate of 400 mL/hr"     rosuvastatin (CRESTOR) 40 MG tablet Take 1 tablet (40 mg total) by mouth daily. 90 tablet 3   spironolactone (ALDACTONE) 25 MG tablet Take 1 tablet (25 mg total) by mouth daily. 30 tablet 0   vitamin B-12 (CYANOCOBALAMIN) 1000 MCG tablet Take 1,000 mcg by mouth daily.     Vitamin D, Ergocalciferol, (DRISDOL) 50000 UNITS CAPS capsule Take 50,000 Units by mouth every Monday.      No current facility-administered medications on file prior to visit.    Allergies:   Allergies  Allergen Reactions   Contrast Media [Iodinated Diagnostic Agents] Anaphylaxis    01-10-15---PT GIVEN 13 HR PRE MEDS FOR CT--PT TOLERATED IV CONTRAST W/O ANY REACTION------[name] RT-R CT   Fluorescein Shortness Of Breath and Other (See Comments)    (Dye)   Iodine Anaphylaxis    Contrast dye - iodine   Molds & Smuts Shortness Of Breath and Other (See Comments)    Congestion and wheezing, also   Shellfish-Derived Products Anaphylaxis, Shortness Of Breath, Swelling  and Other (See Comments)    Welts, also   Azithromycin Nausea Only   Codeine Hives and Nausea Only   Metformin Hcl Er Nausea Only   Tramadol Hcl Nausea Only   Other Rash and  Other (See Comments)    Coban causes welts, also    Physical Exam General: Mildly obese middle-aged African-American lady seated, in no evident distress Head: head normocephalic and atraumatic.   Neck: supple with no carotid or supraclavicular bruits Cardiovascular: regular rate and rhythm, no murmurs Musculoskeletal: no deformity.  Wearing a right wrist splint. Skin:  no rash/petichiae left eye swollen due to stye Vascular:  Normal pulses all extremities  Neurologic Exam Mental Status: Awake and fully alert. Oriented to place and time. Recent and remote memory intact. Attention span, concentration and fund of knowledge appropriate. Mood and affect appropriate.  Cranial Nerves: Fundoscopic exam difficult through undilated pupils.  Pupils equal, briskly reactive to light. Extraocular movements full without nystagmus but complains of subjective diplopia on upgaze..  Bilateral resting ptosis left greater than right which increases with sustained upgaze for 30 seconds with patient developing vertical diplopia.  Vision acuity is diminished in the right eye.  Visual fields full to confrontation in the left eye but diminished in the right.. Hearing intact. Facial sensation intact. Face, tongue, palate moves normally and symmetrically.  Motor: Normal bulk and tone. Normal strength in all tested extremity muscles.  Mild fine action tremor of outstretched upper extremities right greater than left. Sensory.:  Subjective diminished right hemibody touch , pinprick , position and vibratory sensation.  With splitting of the midline to touch and forehead to vibration. Coordination: Rapid alternating movements normal in all extremities. Finger-to-nose and heel-to-shin performed accurately bilaterally. Gait and Station: Arises from chair without difficulty.  Uses a walker.  Stance is slightly broad-based. Gait is cautious but with reasonable balance with walker.. Able to heel, toe and tandem walk with moderate t  difficulty.  Reflexes: 1+ and symmetric. Toes downgoing.      ASSESSMENT: 70 year old African-American lady with  right face paresthesias as well as intermittent headaches which have shown only partial response to Topamax.  Remote history of left thalamic lacunar infarct from small vessel disease.  Vascular risk factors of diabetes, hypertension, hyperlipidemia, obesity and sleep apnea.  She also has seronegative ocular myasthenia which appears stable and well controlled on current medication regimen.  New complaints of right hand pain and paresthesias likely from carpal tunnel syndrome.     PLAN: I had a long discussion with the patient with regards to her headaches as well as paresthesias and answered questions.  I encouraged her to take Topamax 50 mg twice daily to help.  I also encouraged her to wear right wrist extension splint regularly for her carpal tunnel and to consider surgery since she has significant disabling pain.  She is also still on aspirin for stroke prevention and maintain aggressive risk factor modification with strict control of hypertension with blood pressure goal below 130/90, lipids with LDL cholesterol goal below 70 mg percent and diabetes with hemoglobin A1c goal below 6.5%.  She will continue follow-up for myasthenia with Dr. Wilfred Curtis at [facility] and stay on the current doses of prednisone and CellCept.  She will return for follow-up in the future to see me in 6 months or call earlier if necessary.  She was advised to see a surgeon to discuss surgical treatment for her right hand carpal tunnel syndrome.  Greater than 50% time during this 30 minute  visit was spent on counseling and coordination of care about her facial paresthesias and new headaches as well as remote stroke and ocular myasthenia discussion and answering questions. [name] Contras, MD Note: This document was prepared with digital dictation and possible smart phrase technology. Any transcriptional errors that result from  this process are unintentional.

## 2021-02-18 NOTE — Patient Instructions (Signed)
I had a long discussion with the patient with regards to her headaches as well as paresthesias and answered questions.  I encouraged her to take Topamax 50 mg twice daily to help.  I also encouraged her to wear right wrist extension splint regularly for her carpal tunnel and to consider surgery since she has significant disabling pain.  She is also still on aspirin for stroke prevention and maintain aggressive risk factor modification with strict control of hypertension with blood pressure goal below 130/90, lipids with LDL cholesterol goal below 70 mg percent and diabetes with hemoglobin A1c goal below 6.5%.  She will continue follow-up for myasthenia with Dr. Wilfred Curtis at [facility] and stay on the current doses of prednisone and CellCept.  She will return for follow-up in the future to see me in 6 months or call earlier if necessary.

## 2021-02-19 DIAGNOSIS — E119 Type 2 diabetes mellitus without complications: Secondary | ICD-10-CM | POA: Diagnosis not present

## 2021-02-19 DIAGNOSIS — Z794 Long term (current) use of insulin: Secondary | ICD-10-CM | POA: Diagnosis not present

## 2021-02-27 DIAGNOSIS — G7000 Myasthenia gravis without (acute) exacerbation: Secondary | ICD-10-CM | POA: Diagnosis not present

## 2021-03-14 ENCOUNTER — Other Ambulatory Visit: Payer: Self-pay | Admitting: Cardiology

## 2021-03-14 DIAGNOSIS — E7800 Pure hypercholesterolemia, unspecified: Secondary | ICD-10-CM | POA: Diagnosis not present

## 2021-03-15 LAB — LIPID PANEL WITH LDL/HDL RATIO
Cholesterol, Total: 161 mg/dL (ref 100–199)
HDL: 49 mg/dL (ref >39)
LDL Chol Calc (NIH): 91 mg/dL (ref 0–99)
LDL/HDL Ratio: 1.9 ratio (ref 0.0–3.2)
Triglycerides: 118 mg/dL (ref 0–149)
VLDL Cholesterol Cal: 21 mg/dL (ref 5–40)

## 2021-03-16 NOTE — Progress Notes (Signed)
Lipids improved significantly since changing from simvastatin 40 mg to Crestor 40 mg.  LDL still not at goal, could consider addition of Zetia.

## 2021-03-21 DIAGNOSIS — Z794 Long term (current) use of insulin: Secondary | ICD-10-CM | POA: Diagnosis not present

## 2021-03-21 DIAGNOSIS — E119 Type 2 diabetes mellitus without complications: Secondary | ICD-10-CM | POA: Diagnosis not present

## 2021-03-25 DIAGNOSIS — R031 Nonspecific low blood-pressure reading: Secondary | ICD-10-CM | POA: Diagnosis not present

## 2021-03-27 DIAGNOSIS — E113392 Type 2 diabetes mellitus with moderate nonproliferative diabetic retinopathy without macular edema, left eye: Secondary | ICD-10-CM | POA: Diagnosis not present

## 2021-03-27 DIAGNOSIS — H5213 Myopia, bilateral: Secondary | ICD-10-CM | POA: Diagnosis not present

## 2021-03-27 DIAGNOSIS — L723 Sebaceous cyst: Secondary | ICD-10-CM | POA: Diagnosis not present

## 2021-03-27 DIAGNOSIS — E113311 Type 2 diabetes mellitus with moderate nonproliferative diabetic retinopathy with macular edema, right eye: Secondary | ICD-10-CM | POA: Diagnosis not present

## 2021-03-27 DIAGNOSIS — G7000 Myasthenia gravis without (acute) exacerbation: Secondary | ICD-10-CM | POA: Diagnosis not present

## 2021-03-27 DIAGNOSIS — H40023 Open angle with borderline findings, high risk, bilateral: Secondary | ICD-10-CM | POA: Diagnosis not present

## 2021-04-14 DIAGNOSIS — M25551 Pain in right hip: Secondary | ICD-10-CM | POA: Diagnosis not present

## 2021-04-14 DIAGNOSIS — M549 Dorsalgia, unspecified: Secondary | ICD-10-CM | POA: Diagnosis not present

## 2021-04-21 DIAGNOSIS — E119 Type 2 diabetes mellitus without complications: Secondary | ICD-10-CM | POA: Diagnosis not present

## 2021-04-21 DIAGNOSIS — Z794 Long term (current) use of insulin: Secondary | ICD-10-CM | POA: Diagnosis not present

## 2021-04-24 DIAGNOSIS — Z961 Presence of intraocular lens: Secondary | ICD-10-CM | POA: Diagnosis not present

## 2021-04-24 DIAGNOSIS — H469 Unspecified optic neuritis: Secondary | ICD-10-CM | POA: Diagnosis not present

## 2021-04-24 DIAGNOSIS — E113313 Type 2 diabetes mellitus with moderate nonproliferative diabetic retinopathy with macular edema, bilateral: Secondary | ICD-10-CM | POA: Diagnosis not present

## 2021-04-24 DIAGNOSIS — G7000 Myasthenia gravis without (acute) exacerbation: Secondary | ICD-10-CM | POA: Diagnosis not present

## 2021-04-24 DIAGNOSIS — H3509 Other intraretinal microvascular abnormalities: Secondary | ICD-10-CM | POA: Diagnosis not present

## 2021-04-24 DIAGNOSIS — Z794 Long term (current) use of insulin: Secondary | ICD-10-CM | POA: Diagnosis not present

## 2021-04-24 DIAGNOSIS — H3500 Unspecified background retinopathy: Secondary | ICD-10-CM | POA: Diagnosis not present

## 2021-04-24 DIAGNOSIS — Z7984 Long term (current) use of oral hypoglycemic drugs: Secondary | ICD-10-CM | POA: Diagnosis not present

## 2021-05-06 ENCOUNTER — Telehealth: Payer: Self-pay | Admitting: Neurology

## 2021-05-06 MED ORDER — TOPIRAMATE 50 MG PO TABS: 50.0000 mg | ORAL_TABLET | Freq: Two times a day (BID) | ORAL | 0 refills | Status: DC

## 2021-05-06 NOTE — Telephone Encounter (Signed)
90-day sent to pharmacy.

## 2021-05-06 NOTE — Telephone Encounter (Signed)
Pt is wanting to know if her topiramate (TOPAMAX) 50 MG tablet can be ordered in the 90 qty Please advise.

## 2021-05-16 ENCOUNTER — Telehealth: Payer: Self-pay

## 2021-05-16 NOTE — Telephone Encounter (Signed)
Yes indeed, please ask him to check orthostatics

## 2021-05-19 NOTE — Telephone Encounter (Signed)
Called joseph and left a vm

## 2021-05-21 ENCOUNTER — Telehealth: Payer: Self-pay

## 2021-05-21 DIAGNOSIS — Z794 Long term (current) use of insulin: Secondary | ICD-10-CM | POA: Diagnosis not present

## 2021-05-21 DIAGNOSIS — E119 Type 2 diabetes mellitus without complications: Secondary | ICD-10-CM | POA: Diagnosis not present

## 2021-05-21 DIAGNOSIS — R0609 Other forms of dyspnea: Secondary | ICD-10-CM

## 2021-05-21 DIAGNOSIS — I10 Essential (primary) hypertension: Secondary | ICD-10-CM

## 2021-05-21 NOTE — Telephone Encounter (Signed)
Joe with [facility] called and stated that the Pt has been having issues with weight gain. The patient was between 184-185lbs. She has gone up to 190.2lbs in about a two week stretch. Her weight fluctuates when she takes her lasix.  Pt is more SOB when lying down. Pt has been taking her furosemide PRN. She took her furosemide yesterday and plans on taking another one today. Joe also states the the pt has been heaving low BP readings, specifically her diastolic numbers. He also stated that they are unable to do orthostatics on the pt because she checks her BP on her own

## 2021-05-23 ENCOUNTER — Telehealth: Payer: Self-pay | Admitting: Cardiology

## 2021-05-23 DIAGNOSIS — E559 Vitamin D deficiency, unspecified: Secondary | ICD-10-CM | POA: Diagnosis not present

## 2021-05-23 DIAGNOSIS — E1165 Type 2 diabetes mellitus with hyperglycemia: Secondary | ICD-10-CM | POA: Diagnosis not present

## 2021-05-23 DIAGNOSIS — E1065 Type 1 diabetes mellitus with hyperglycemia: Secondary | ICD-10-CM | POA: Diagnosis not present

## 2021-05-23 DIAGNOSIS — E890 Postprocedural hypothyroidism: Secondary | ICD-10-CM | POA: Diagnosis not present

## 2021-05-23 NOTE — Telephone Encounter (Signed)
Put on her schedule but I can see the patient, make sure she gets labs done

## 2021-05-23 NOTE — Telephone Encounter (Signed)
I had ordered labs for her to be done at [place]. She is non compliant and does not follow all the medications appropriately and has not had labs done either. We can see her in the office to determine if the weight gain is from food or from fluid. I will also add another lab and she has to get it done today or monday

## 2021-05-23 NOTE — Telephone Encounter (Signed)
Pt had blood work done this morning at [facility]. Pt stated she is taking all the medications as directed. She is also being transferred up front to schedule an appt.

## 2021-05-23 NOTE — Telephone Encounter (Signed)
Patient called about some symptoms she is experiencing. She spoke with Lorna Few, and Lorna Few said she needed an appointment asap. The first available was not until 10/31. There is nothing sooner in your schedule & the patient does not wish to see Celeste. I scheduled the patient for 10/31 for now, please let me know where you would like to move the patient in your schedule if she needs to be seen sooner.

## 2021-05-26 DIAGNOSIS — R0600 Dyspnea, unspecified: Secondary | ICD-10-CM | POA: Diagnosis not present

## 2021-05-27 LAB — BRAIN NATRIURETIC PEPTIDE: BNP: 7.5 pg/mL (ref 0.0–100.0)

## 2021-05-28 DIAGNOSIS — Z9641 Presence of insulin pump (external) (internal): Secondary | ICD-10-CM | POA: Diagnosis not present

## 2021-05-28 DIAGNOSIS — I10 Essential (primary) hypertension: Secondary | ICD-10-CM | POA: Diagnosis not present

## 2021-05-28 DIAGNOSIS — Z85850 Personal history of malignant neoplasm of thyroid: Secondary | ICD-10-CM | POA: Diagnosis not present

## 2021-05-28 DIAGNOSIS — E559 Vitamin D deficiency, unspecified: Secondary | ICD-10-CM | POA: Diagnosis not present

## 2021-05-28 DIAGNOSIS — G7000 Myasthenia gravis without (acute) exacerbation: Secondary | ICD-10-CM | POA: Diagnosis not present

## 2021-05-28 DIAGNOSIS — E7800 Pure hypercholesterolemia, unspecified: Secondary | ICD-10-CM | POA: Diagnosis not present

## 2021-05-28 DIAGNOSIS — E890 Postprocedural hypothyroidism: Secondary | ICD-10-CM | POA: Diagnosis not present

## 2021-05-28 DIAGNOSIS — E1165 Type 2 diabetes mellitus with hyperglycemia: Secondary | ICD-10-CM | POA: Diagnosis not present

## 2021-05-28 NOTE — Progress Notes (Signed)
Primary Physician/Referring:  [name], DO  Patient ID: [name], female    DOB: 1950-12-06, 70 y.o.   MRN: [id number]  Chief Complaint  Patient presents with   Hypertension   Follow-up   hypotension   HPI:    [name]  is a 70 y.o. African-American female  with  asymptomatic carotid stenosis, uncontrolled DM with stage 3 CKD and diabetic retinopathy,, hypertension, hyperlipidemia, dyspnea on exertion,  thyroid nodules, follows Dr. 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  Patient was seen by me in June 2022, this visit was initiated by a health insurance nurse visits stating that she has had low diastolic blood pressure, worsening dyspnea and also weight gain.  Patient herself denies any other specific symptoms except her weakness from neurologic deficits is slightly getting worse and she has an appointment to see [facility] neurology.  Dyspnea is very mild and stable, no PND or orthopnea, no leg edema.  No symptoms to suggest TIA.  States that her weight has been relatively stable.  Past Medical History:  Diagnosis Date   Arthritis    "all over my body" (03/13/2013)   Asthma    Asymptomatic carotid artery stenosis, bilateral 10/08/2018   GERD (gastroesophageal reflux disease)    H/O hiatal hernia    Heart murmur    Hypercholesterolemia 10/08/2018   Hypertension    Hypothyroidism    Myasthenia gravis ([place])    "in my eyes; diagnsosed > 7 yr ago" (03/13/2013)   Sleep apnea    on CPAP   Thyroid carcinoma ([place])    Type II diabetes mellitus ([place])    Past Surgical History:  Procedure Laterality Date   ABDOMINAL HYSTERECTOMY     ANTERIOR CERVICAL DECOMP/DISCECTOMY FUSION     "I've had severa ORs; always went in from the front" (03/13/2013)   [place]     "several" (03/13/2013)   CARPAL TUNNEL RELEASE Right    CATARACT EXTRACTION W/ INTRAOCULAR LENS  IMPLANT, BILATERAL Bilateral    CHOLECYSTECTOMY     KNEE ARTHROSCOPY Left    SHOULDER ARTHROSCOPY W/ ROTATOR CUFF REPAIR Left    TONSILLECTOMY     TOTAL THYROIDECTOMY     Social History   Socioeconomic History   Marital status: Single    Spouse name: Not on file   Number of children: 0   Years of education: college   Highest education level: Not on file  Occupational History   Occupation: retired  Tobacco Use   Smoking status: Never   Smokeless tobacco: Never  Vaping Use   Vaping Use: Never used  Substance and Sexual Activity   Alcohol use: No    Alcohol/week: 0.0 standard drinks   Drug use: No   Sexual activity: Never  Other Topics Concern   Not on file  Social History Narrative   Lives alone   Right handed   Drinks no caffeine   Social Determinants of Health   Financial Resource Strain: Not on file  Food Insecurity: Not on file  Transportation Needs: Not on file  Physical Activity: Not on file  Stress: Not on file  Social Connections: Not on file  Intimate Partner Violence: Not on file   ROS  Review of Systems  Cardiovascular:  Positive for dyspnea on exertion (Chronic and stable).  Negative for chest pain and leg swelling.  Musculoskeletal:  Positive for back pain and muscle weakness.  Gastrointestinal:  Negative for melena.  Neurological:  Positive for focal weakness (right arm and leg), loss of balance and numbness (right arm).  Objective  Blood pressure 136/66, pulse 88, temperature 97.7 F (36.5 C), height '5\' 3"'  (1.6 m), weight 190 lb (86.2 kg), SpO2 96 %. Body mass index is 33.66 kg/m.  Vitals with BMI 05/29/2021 02/18/2021 02/13/2021  Height '5\' 3"'  '5\' 3"'  '5\' 3"'   Weight 190 lbs 198 lbs 10 oz 194 lbs 13 oz  BMI 33.67 29.92 42.68  Systolic 341 962 229  Diastolic 66 70 80  Pulse 88 98 113    Orthostatic VS for the past 72 hrs (Last 3  readings):  Orthostatic BP Patient Position BP Location Cuff Size Orthostatic Pulse  05/29/21 0929 136/66 Supine Left Arm -- --  05/29/21 0903 137/65 Standing Left Arm Large 95  05/29/21 0901 145/76 Sitting Left Arm Large 90  05/29/21 0844 -- Sitting Left Arm Large --      Physical Exam Constitutional:      General: She is not in acute distress.    Appearance: She is well-developed. She is obese.     Comments: Mildly obese  Neck:     Thyroid: No thyromegaly.     Vascular: Carotid bruit (right carotid bruit) present. No JVD.  Cardiovascular:     Rate and Rhythm: Normal rate and regular rhythm.     Pulses:          Radial pulses are 2+ on the right side and 2+ on the left side.       Femoral pulses are 2+ on the right side.      Popliteal pulses are 0 on the right side and 0 on the left side.       Dorsalis pedis pulses are 1+ on the right side and 1+ on the left side.       Posterior tibial pulses are 0 on the right side and 0 on the left side.     Heart sounds: Normal heart sounds. No murmur heard.   No gallop.     Comments: popliteal pulse difficult to feel due to patient's body habitus.  Pulmonary:     Effort: Pulmonary effort is normal.     Breath sounds: Normal breath sounds.  Abdominal:     General: Bowel sounds are normal.     Palpations: Abdomen is soft.  Musculoskeletal:     Cervical back: Neck supple.     Right lower leg: No edema.     Left lower leg: No edema.   Radiology: No results found.  Laboratory examination:   No results for input(s): NA, K, CL, CO2, GLUCOSE, BUN, CREATININE, CALCIUM, GFRNONAA, GFRAA in the last 8760 hours. CMP Latest Ref Rng & Units 12/28/2019 12/23/2019 12/19/2019  Glucose 70 - 99 mg/dL 308(H) 467(H) 124(H)  BUN 8 - 23 mg/dL 20 - 20  Creatinine 0.44 - 1.00 mg/dL 0.83 - 0.69  Sodium 135 - 145 mmol/L 137 - 140  Potassium 3.5 - 5.1 mmol/L 3.8 - 3.3(L)  Chloride 98 - 111 mmol/L 109 - 106  CO2 22 - 32 mmol/L 21(L) - 24  Calcium 8.9 -  10.3 mg/dL 9.0 - 10.1  Total Protein 6.5 - 8.1 g/dL 6.2(L) - 7.8  Total Bilirubin 0.3 - 1.2 mg/dL 0.4 - 0.5  Alkaline Phos 38 - 126 U/L 89 - 98  AST 15 -  41 U/L 38 - 22  ALT 0 - 44 U/L 33 - 24   CBC Latest Ref Rng & Units 12/28/2019 12/19/2019 03/14/2013  WBC 4.0 - 10.5 K/uL 6.6 6.7 6.2  Hemoglobin 12.0 - 15.0 g/dL 10.0(L) 11.8(L) 10.0(L)  Hematocrit 36.0 - 46.0 % 31.7(L) 36.2 29.2(L)  Platelets 150 - 400 K/uL 254 261 196   Lipid Panel Recent Labs    01/01/21 1202 03/14/21 0821  CHOL 234* 161  TRIG 157* 118  LDLCALC 133* 91  HDL 74 49  LDLDIRECT 140*  --      Lipid Panel     Component Value Date/Time   CHOL 161 03/14/2021 0821   TRIG 118 03/14/2021 0821   HDL 49 03/14/2021 0821   CHOLHDL 4.3 12/21/2019 0618   VLDL 47 (H) 12/21/2019 0618   LDLCALC 91 03/14/2021 0821   LDLDIRECT 140 (H) 01/01/2021 1202   HEMOGLOBIN A1C Lab Results  Component Value Date   HGBA1C 9.1 (H) 12/20/2019   MPG 214.47 12/20/2019   BNP (last 3 results) Recent Labs    05/26/21 1041  BNP 7.5    ProBNP (last 3 results) No results for input(s): PROBNP in the last 8760 hours.  External labs:  Labs 12/26/2020:  Hb 10.3/HCT 32.4, platelets 209, normal indicis.  Sodium 135, potassium 3.8, BUN 34, creatinine 1.1, EGFR 55 mL.  Serum glucose 327 mg.  CMP otherwise normal.  TSH normal at 0.33.  A1c 9.0%.  Vitamin D 36.1.  Labs 02/05/2020:  Total cholesterol 272, triglycerides 203, HDL 77, LDL 159.  Medications   Current Outpatient Medications on File Prior to Visit  Medication Sig Dispense Refill   acetaminophen (TYLENOL) 325 MG tablet Take 2 tablets (650 mg total) by mouth every 4 (four) hours as needed for mild pain (or temp > 37.5 C (99.5 F)).     albuterol (VENTOLIN HFA) 108 (90 Base) MCG/ACT inhaler Inhale 1-2 puffs into the lungs every 6 (six) hours as needed for wheezing or shortness of breath.     aspirin EC 81 MG EC tablet Take 1 tablet (81 mg total) by mouth daily.     B-D  ULTRAFINE III SHORT PEN 31G X 8 MM MISC      Biotin 5000 MCG CAPS Take 5,000 mcg by mouth daily with breakfast.      BYDUREON BCISE 2 MG/0.85ML AUIJ Inject 2 mg into the skin every Monday.      canagliflozin (INVOKANA) 100 MG TABS tablet Take 1 tablet (100 mg total) by mouth daily before breakfast. (Patient taking differently: Take 300 mg by mouth daily before breakfast.) 30 tablet 0   Cholecalciferol (D2000 ULTRA STRENGTH PO) Take by mouth.     Continuous Blood Gluc Receiver (FREESTYLE LIBRE 14 DAY READER) DEVI 1 Device by Does not apply route as directed.      Continuous Blood Gluc Sensor (FREESTYLE LIBRE 14 DAY SENSOR) MISC Inject 1 patch into the skin every 14 (fourteen) days.     cycloSPORINE (RESTASIS) 0.05 % ophthalmic emulsion Place 1 drop into both eyes 2 (two) times daily. 0.4 mL 0   diclofenac Sodium (VOLTAREN) 1 % GEL Apply 2 g topically 4 (four) times daily. 2 g 0   Elderberry 500 MG CAPS Take by mouth.     empagliflozin (JARDIANCE) 25 MG TABS tablet Take by mouth daily.     esomeprazole (NEXIUM) 40 MG capsule Take 1 capsule (40 mg total) by mouth 2 (two) times daily. 60 capsule 0   ezetimibe (  ZETIA) 10 MG tablet TAKE 1 TABLET BY MOUTH EVERY DAY 90 tablet 1   furosemide (LASIX) 40 MG tablet Take 40 mg by mouth daily as needed.     GAMUNEX-C 40 GM/400ML SOLN q 6 weeks     Insulin Disposable Pump (OMNIPOD DASH 5 PACK PODS) MISC USE UNDER THE SKIN AS DIRECTED EVERY 1 TO 2 DAYS     insulin lispro (HUMALOG) 100 UNIT/ML cartridge Inject into the skin 3 (three) times daily with meals. On sliding scale     levothyroxine (SYNTHROID) 150 MCG tablet Take 150 mcg by mouth daily before breakfast.     lidocaine (LIDODERM) 5 % Place 1 patch onto the skin daily. Remove & Discard patch within 12 hours or as directed by MD 30 patch 0   loratadine (CLARITIN) 10 MG tablet Take 1 tablet (10 mg total) by mouth at bedtime. 30 tablet 0   meclizine (ANTIVERT) 25 MG tablet Take 25 mg by mouth 3 (three) times  daily as needed for dizziness (or migraine-related nausea).      montelukast (SINGULAIR) 10 MG tablet Take 1 tablet (10 mg total) by mouth every morning. 30 tablet 0   mycophenolate (CELLCEPT) 500 MG tablet Take 1,500 mg by mouth in the morning and at bedtime.      olmesartan (BENICAR) 20 MG tablet Take 1 tablet (20 mg total) by mouth daily. 30 tablet 0   predniSONE (DELTASONE) 10 MG tablet Take 10 mg by mouth every morning.     PRESCRIPTION MEDICATION See admin instructions. CPAP- At bedtime     PROAIR HFA 108 (90 BASE) MCG/ACT inhaler Inhale 2 puffs into the lungs every 6 (six) hours as needed for wheezing or shortness of breath.      riTUXimab (RITUXAN) 500 MG/50ML injection Inject 1,000 mg into the vein See admin instructions. 1,000 mg via IV, then another 1,000 mg in two weeks- repeat after 6 months: "riTUXimab (RITUXAN) 1,000 mg in sodium chloride 0.9% 1,000 mL infusion- Begin infusion at 50 mL/hr, if no hypersensitivity or infusion-related events occur, may increase by 50 mL/hr every 30 minutes to a maximum infusion rate of 400 mL/hr"     rosuvastatin (CRESTOR) 40 MG tablet Take 1 tablet (40 mg total) by mouth daily. 90 tablet 3   scopolamine (TRANSDERM-SCOP) 1 MG/3DAYS Place 1 patch onto the skin every 3 (three) days.     spironolactone (ALDACTONE) 25 MG tablet Take 1 tablet (25 mg total) by mouth daily. 30 tablet 0   topiramate (TOPAMAX) 50 MG tablet Take 1 tablet (50 mg total) by mouth 2 (two) times daily. Start 1 tablet daily x 1 week and then twice daily 180 tablet 0   vitamin B-12 (CYANOCOBALAMIN) 1000 MCG tablet Take 1,000 mcg by mouth daily.     Vitamin D, Ergocalciferol, (DRISDOL) 50000 UNITS CAPS capsule Take 50,000 Units by mouth every Monday.      No current facility-administered medications on file prior to visit.    Radiology:   MR angio of the head 12/21/2019: Atherosclerotic disease right middle cerebral artery branch otherwise negative. No large vessel occlusion.  MRA  chest with and without contrast 12/20/2019: 1. No evidence of acute aortic dissection, aortic aneurysm or other acute vascular abnormality. 2. 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 3. Heterogeneous and irregular/ulcerated atherosclerotic plaque along the descending thoracic aorta. 4. Concentric hypertrophy of the left ventricular myocardium suggests chronic systolic hypertension.  MRI of the  brain 11/09/2020: 1. No acute intracranial abnormality. Expected evolution of the left thalamic lacunar infarct seen last year and otherwise normal for age noncontrast MRI appearance of the brain. 2. Prior cervical ACDF with adjacent segment disease resulting in significant spinal stenosis at C2-C3. Query myelopathic symptoms. Dedicated Cervical Spine MRI would be valuable.  Cardiac Studies:   Lexiscan myoview stress test 06/25/2017: 1. Resting EKG NSR. Stress EKG: Non diagnostic for ischemia due to pharmacologic stress testing. No ST-T changes of ischemia. 8/10 chest pain with Lexiscan infusion. 2. Myocardial perfusion study was normal, without evidence of ischemia, ejection fraction 70%. Low risk study.  Echocardiogram 12/20/2019:  1. Left ventricular ejection fraction, by estimation, is 60 to 65%. The left ventricle has normal function. The left ventricle has no regional wall motion abnormalities. Left ventricular diastolic parameters are consistent with Grade I diastolic dysfunction (impaired relaxation). 2. Right ventricular systolic function is normal. The right ventricular size is normal. 3. Left atrial size was moderately dilated. 4. The mitral valve is grossly normal. Trivial mitral valve regurgitation. No evidence of mitral stenosis. 5. Mild calcification of the non coronary cusp.. The aortic valve is tricuspid. Aortic valve regurgitation is not visualized. No aortic stenosis is present. 6. The inferior vena cava  is normal in size with greater than 50% respiratory variability,suggesting right atrial pressure of 3 mmHg.  Lower Extremity Venous Duplex  12/20/2019: RIGHT:  - There is no evidence of deep vein thrombosis in the lower extremity.  - No cystic structure found in the popliteal fossa.  LEFT:  - There is no evidence of deep vein thrombosis in the lower extremity.  - No cystic structure found in the popliteal fossa.   Carotid artery duplex 01/07/2021: Stenosis in the right internal carotid artery (>=70%). Peak velocity 329/75 cm/S. The right PSV internal/common carotid artery ratio of 4.27 is consistent with a stenosis of >70%. 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 Stenosis in the left external carotid artery (<50%). Antegrade right vertebral artery flow. Antegrade left vertebral artery flow. Follow up in six months is appropriate if clinically indicated. Compared to the study done on  12/11/2019, no significant change.  EKG:  EKG 05/29/2021: Normal sinus rhythm at rate of 89 bpm, normal axis.  Nonspecific T abnormality.  No significant change from 01/01/2021.   Assessment     ICD-10-CM   1. Essential hypertension  I10 EKG 12-Lead    2. Dyspnea on exertion  R06.09     3. Hypercholesterolemia  E78.00 Lipid Panel With LDL/HDL Ratio    LDL cholesterol, direct    4. Asymptomatic carotid artery stenosis, bilateral  I65.23       Orders Placed This Encounter  Procedures   Lipid Panel With LDL/HDL Ratio   LDL cholesterol, direct   EKG 12-Lead   No orders of the defined types were placed in this encounter.  Medications Discontinued During This Encounter  Medication Reason   levothyroxine (SYNTHROID) 175 MCG tablet Dose change      Recommendations:   [name]  is a 70 y.o. African-American female  with  asymptomatic carotid stenosis, uncontrolled DM with stage 3 CKD and diabetic retinopathy,, hypertension, hyperlipidemia, dyspnea on exertion,  thyroid nodules,  follows Dr. 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   This visit was set up due to cost from the [place] healthcare nurses  doing home monitoring stating that she has been having increasing weight gain and also significant fluctuation in blood pressure with low blood pressure.  However on examination, patient's weight has been fairly stable, there is no clinical evidence of heart failure, BNP is completely normal.  Her blood pressure is also well controlled, she is not significantly orthostatic.  With regard to hyperlipidemia, she is now tolerating high intensity statin 40 mg along with ezetimibe.  We will check lipid profile today.  Carotid artery stenosis has remained stable, she will continue surveillance as usual.  She has an appointment to see me in December which I advised her to keep.  She is doing her best to make dietary changes and watching her diet very closely and also trying to remain as active as possible.  Her physical examination has not changed.    Adrian Prows, MD, [facility] 05/29/2021, 9:30 AM Office: [phone] Fax: [phone] Pager: [phone]

## 2021-05-29 ENCOUNTER — Ambulatory Visit: Payer: Medicare Other | Admitting: Student

## 2021-05-29 ENCOUNTER — Other Ambulatory Visit: Payer: Self-pay

## 2021-05-29 ENCOUNTER — Encounter: Payer: Self-pay | Admitting: Student

## 2021-05-29 VITALS — BP 136/66 | HR 88 | Temp 97.7°F | Ht 63.0 in | Wt 190.0 lb

## 2021-05-29 DIAGNOSIS — R0609 Other forms of dyspnea: Secondary | ICD-10-CM

## 2021-05-29 DIAGNOSIS — I6523 Occlusion and stenosis of bilateral carotid arteries: Secondary | ICD-10-CM

## 2021-05-29 DIAGNOSIS — I10 Essential (primary) hypertension: Secondary | ICD-10-CM

## 2021-05-29 DIAGNOSIS — E7800 Pure hypercholesterolemia, unspecified: Secondary | ICD-10-CM | POA: Diagnosis not present

## 2021-05-30 LAB — LIPID PANEL WITH LDL/HDL RATIO
Cholesterol, Total: 198 mg/dL (ref 100–199)
HDL: 72 mg/dL (ref >39)
LDL Chol Calc (NIH): 96 mg/dL (ref 0–99)
LDL/HDL Ratio: 1.3 ratio (ref 0.0–3.2)
Triglycerides: 179 mg/dL — ABNORMAL HIGH (ref 0–149)
VLDL Cholesterol Cal: 30 mg/dL (ref 5–40)

## 2021-05-30 LAB — LDL CHOLESTEROL, DIRECT: LDL Direct: 96 mg/dL (ref 0–99)

## 2021-06-04 NOTE — Telephone Encounter (Signed)
This has been taken care of, patient was seen.

## 2021-06-12 DIAGNOSIS — G7000 Myasthenia gravis without (acute) exacerbation: Secondary | ICD-10-CM | POA: Diagnosis not present

## 2021-06-12 DIAGNOSIS — H3500 Unspecified background retinopathy: Secondary | ICD-10-CM | POA: Diagnosis not present

## 2021-06-16 DIAGNOSIS — Z794 Long term (current) use of insulin: Secondary | ICD-10-CM | POA: Diagnosis not present

## 2021-06-16 DIAGNOSIS — E1165 Type 2 diabetes mellitus with hyperglycemia: Secondary | ICD-10-CM | POA: Diagnosis not present

## 2021-06-18 DIAGNOSIS — Z9641 Presence of insulin pump (external) (internal): Secondary | ICD-10-CM | POA: Diagnosis not present

## 2021-06-18 DIAGNOSIS — E7800 Pure hypercholesterolemia, unspecified: Secondary | ICD-10-CM | POA: Diagnosis not present

## 2021-06-18 DIAGNOSIS — E1165 Type 2 diabetes mellitus with hyperglycemia: Secondary | ICD-10-CM | POA: Diagnosis not present

## 2021-06-18 DIAGNOSIS — E890 Postprocedural hypothyroidism: Secondary | ICD-10-CM | POA: Diagnosis not present

## 2021-06-18 DIAGNOSIS — Z23 Encounter for immunization: Secondary | ICD-10-CM | POA: Diagnosis not present

## 2021-06-18 DIAGNOSIS — I10 Essential (primary) hypertension: Secondary | ICD-10-CM | POA: Diagnosis not present

## 2021-06-20 DIAGNOSIS — E119 Type 2 diabetes mellitus without complications: Secondary | ICD-10-CM | POA: Diagnosis not present

## 2021-06-20 DIAGNOSIS — Z794 Long term (current) use of insulin: Secondary | ICD-10-CM | POA: Diagnosis not present

## 2021-06-23 ENCOUNTER — Ambulatory Visit: Payer: Medicare Other | Admitting: Cardiology

## 2021-06-26 DIAGNOSIS — G7000 Myasthenia gravis without (acute) exacerbation: Secondary | ICD-10-CM | POA: Diagnosis not present

## 2021-06-26 DIAGNOSIS — H3500 Unspecified background retinopathy: Secondary | ICD-10-CM | POA: Diagnosis not present

## 2021-06-26 DIAGNOSIS — R519 Headache, unspecified: Secondary | ICD-10-CM | POA: Diagnosis not present

## 2021-07-05 DIAGNOSIS — Z1231 Encounter for screening mammogram for malignant neoplasm of breast: Secondary | ICD-10-CM | POA: Diagnosis not present

## 2021-07-11 DIAGNOSIS — E113293 Type 2 diabetes mellitus with mild nonproliferative diabetic retinopathy without macular edema, bilateral: Secondary | ICD-10-CM | POA: Diagnosis not present

## 2021-07-11 DIAGNOSIS — H3500 Unspecified background retinopathy: Secondary | ICD-10-CM | POA: Diagnosis not present

## 2021-07-11 DIAGNOSIS — Z79899 Other long term (current) drug therapy: Secondary | ICD-10-CM | POA: Diagnosis not present

## 2021-07-11 DIAGNOSIS — H469 Unspecified optic neuritis: Secondary | ICD-10-CM | POA: Diagnosis not present

## 2021-07-11 DIAGNOSIS — G7000 Myasthenia gravis without (acute) exacerbation: Secondary | ICD-10-CM | POA: Diagnosis not present

## 2021-07-21 DIAGNOSIS — E119 Type 2 diabetes mellitus without complications: Secondary | ICD-10-CM | POA: Diagnosis not present

## 2021-07-21 DIAGNOSIS — Z794 Long term (current) use of insulin: Secondary | ICD-10-CM | POA: Diagnosis not present

## 2021-07-29 ENCOUNTER — Other Ambulatory Visit: Payer: Self-pay

## 2021-07-29 ENCOUNTER — Ambulatory Visit: Payer: Medicare Other

## 2021-07-29 DIAGNOSIS — I6523 Occlusion and stenosis of bilateral carotid arteries: Secondary | ICD-10-CM | POA: Diagnosis not present

## 2021-07-30 DIAGNOSIS — I6930 Unspecified sequelae of cerebral infarction: Secondary | ICD-10-CM | POA: Diagnosis not present

## 2021-07-30 DIAGNOSIS — Z0000 Encounter for general adult medical examination without abnormal findings: Secondary | ICD-10-CM | POA: Diagnosis not present

## 2021-07-30 DIAGNOSIS — E890 Postprocedural hypothyroidism: Secondary | ICD-10-CM | POA: Diagnosis not present

## 2021-07-30 DIAGNOSIS — E1159 Type 2 diabetes mellitus with other circulatory complications: Secondary | ICD-10-CM | POA: Diagnosis not present

## 2021-07-30 DIAGNOSIS — G7000 Myasthenia gravis without (acute) exacerbation: Secondary | ICD-10-CM | POA: Diagnosis not present

## 2021-07-30 DIAGNOSIS — I6523 Occlusion and stenosis of bilateral carotid arteries: Secondary | ICD-10-CM | POA: Diagnosis not present

## 2021-07-30 DIAGNOSIS — Z79899 Other long term (current) drug therapy: Secondary | ICD-10-CM | POA: Diagnosis not present

## 2021-07-30 DIAGNOSIS — H5789 Other specified disorders of eye and adnexa: Secondary | ICD-10-CM | POA: Diagnosis not present

## 2021-07-30 DIAGNOSIS — I69393 Ataxia following cerebral infarction: Secondary | ICD-10-CM | POA: Diagnosis not present

## 2021-08-06 ENCOUNTER — Encounter: Payer: Self-pay | Admitting: Neurology

## 2021-08-06 ENCOUNTER — Ambulatory Visit (INDEPENDENT_AMBULATORY_CARE_PROVIDER_SITE_OTHER): Payer: Medicare Other | Admitting: Neurology

## 2021-08-06 VITALS — BP 131/68 | HR 89 | Ht 68.0 in | Wt 198.5 lb

## 2021-08-06 DIAGNOSIS — G43009 Migraine without aura, not intractable, without status migrainosus: Secondary | ICD-10-CM

## 2021-08-06 DIAGNOSIS — Z8673 Personal history of transient ischemic attack (TIA), and cerebral infarction without residual deficits: Secondary | ICD-10-CM

## 2021-08-06 DIAGNOSIS — G7000 Myasthenia gravis without (acute) exacerbation: Secondary | ICD-10-CM | POA: Diagnosis not present

## 2021-08-06 MED ORDER — TOPIRAMATE 50 MG PO TABS: 50.0000 mg | ORAL_TABLET | Freq: Every day | ORAL | 0 refills | Status: DC

## 2021-08-06 NOTE — Progress Notes (Signed)
[facility] [address] [place]. [place] [address] [phone]       OFFICE FOLLOW UP VISIT NOTE  [name] Date of Birth:  04-01-1951 Medical Record Number:  [id number]   Referring MD: [name]  Reason for Referral: Numbness and headache  HPI: Initial visit 10/29/2020 :[name] is a 70 year old African-American lady seen today for office consultation visit.  History is obtained from the patient, review of electronic medical records as well as care everywhere and I personally reviewed pertinent imaging films in PACS.  She has past medical history of hypertension, hyperlipidemia, obesity, asthma, hypothyroidism, seronegative ocular myasthenia, sleep apnea, thyroid carcinoma, diabetes.  Patient states that she has had new onset of numbness involving the right face for the last few months.  This is intermittent and can last for several minutes to hours and can occur at variable frequency couple of times a week to once every couple of weeks.  She is also noticed for the last several weeks headaches which is sharp shooting severe lasting few seconds involving mostly the frontal and occasionally occipital regions.  These also occur to 3 times a day.  She is been taking some Tylenol which seems to help.  There is occasional sweating with these headaches but she denies any tearing of her eyes or redness accompanying the headaches.  Patient has tried taking gabapentin for the numbness which has not helped.  She is presently on 300 mg 3 times daily which was recently increased.  She has actually history of left thalamic lacunar infarct in April 2021 at that time she did have some right face paresthesias and numbness which actually resolved several months after till they recurred again recently.  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  She has been following up with Dr. Nanine [name] neurologist at [facility] and is  presently on CellCept 1500 mg twice daily and prednisone 10 mg daily.  She still has some mild ptosis and intermittent diplopia but symptoms have been stable for quite some time.  She does have an appointment to see Dr. Nanine [name] in June.  She denies any recurrent stroke or TIA symptoms.  She is on aspirin which is tolerating well without bruising or bleeding.  She states her sugars are now better controlled and she is current only on insulin pump and uses her insulin sensor and has an upcoming appointment at the end of the month with endocrinologist Dr. Soyla [name] and will have lipid profile and A1c checked at that visit.  She states her blood pressure is usually runs pretty good though today it is elevated at 170/76 as she was late for the appointment and she could not find her office.  She denies any loss of vision with her recent new headaches or muscle aches or pains or jaw claudication or scalp tenderness. Update 02/18/2021: She returns for follow-up after last visit 3 months ago.  Patient states she has noticed some improvement in her headaches and numbness when she takes Topamax but she is not taking it on a scheduled basis and takes it only as needed.  Symptoms still persist but not as bothersome.  She has a new complaint of pain and swelling in the left eye and she has seen an ophthalmologist with diagnosed her with a stye.  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  LDL cholesterol on  01/01/2021 was 133 and Dr. Einar [name] increase the dose of Crestor which she now takes at 40 mg daily.  She also had carotid ultrasound on 01/07/2021 which showed 70% right ICA and 50 to 69% left ICA stenosis.  She also is complaining of right hand pain and is wearing a carpal tunnel splint.  She has been advised surgery but she is not happy with the surgeon and is thinking about a second opinion. Update 08/06/2021 : Patient returns for follow-up  after last visit 6 months ago.  She is accompanied by her daughter.  Patient states her headaches are doing well and still occur intermittently off and on once every 2 to 3 weeks.  She is on Topamax 50 mg twice daily but feels that the morning dose of makes her sleepy and wonders if she needs to reduce the dose.  She is not had much episodes of numbness and occasionally has numbness involving her nose and right face which is not bothersome.  She does follow-up with her neurologist at [facility] for myasthenia and getting treated with IVIG infusion symptoms appear to be stable.  She has had no recurrent stroke or TIA symptoms.  She did have follow-up carotid ultrasound done last week in [name] office which shows stable appearance of greater than 70% right ICA and 50 to 69% left ICA stenosis.  She has no new symptoms.  She wants to drive. ROS:   14 system review of systems is positive for numbness, headache, left eye pain and eyelid swelling drooping of eyelids, diplopia, wrist pain and all other systems negative PMH:  Past Medical History:  Diagnosis Date   Arthritis    "all over my body" (03/13/2013)   Asthma    Asymptomatic carotid artery stenosis, bilateral 10/08/2018   GERD (gastroesophageal reflux disease)    H/O hiatal hernia    Heart murmur    Hypercholesterolemia 10/08/2018   Hypertension    Hypothyroidism    Myasthenia gravis ([place])    "in my eyes; diagnsosed > 7 yr ago" (03/13/2013)   Sleep apnea    on CPAP   Thyroid carcinoma (HCC)    Type II diabetes mellitus ([place])     Social History:  Social History   Socioeconomic History   Marital status: Single    Spouse name: Not on file   Number of children: 0   Years of education: college   Highest education level: Not on file  Occupational History   Occupation: retired  Tobacco Use   Smoking status: Never   Smokeless tobacco: Never  Vaping Use   Vaping Use: Never used  Substance and Sexual Activity   Alcohol use: No     Alcohol/week: 0.0 standard drinks   Drug use: No   Sexual activity: Never  Other Topics Concern   Not on file  Social History Narrative   Lives alone   Right handed   Drinks no caffeine   Social Determinants of Health   Financial Resource Strain: Not on file  Food Insecurity: Not on file  Transportation Needs: Not on file  Physical Activity: Not on file  Stress: Not on file  Social Connections: Not on file  Intimate Partner Violence: Not on file    Medications:   Current Outpatient Medications on File Prior to Visit  Medication Sig Dispense Refill   acetaminophen (TYLENOL) 325 MG tablet Take 2 tablets (650 mg total) by mouth every 4 (four) hours as needed for mild pain (or temp > 37.5 C (  99.5 F)).     aspirin EC 81 MG EC tablet Take 1 tablet (81 mg total) by mouth daily.     B-D ULTRAFINE III SHORT PEN 31G X 8 MM MISC      Biotin 5000 MCG CAPS Take 5,000 mcg by mouth daily with breakfast.      BYDUREON BCISE 2 MG/0.85ML AUIJ Inject 2 mg into the skin every Monday.      Cholecalciferol (D2000 ULTRA STRENGTH PO) Take by mouth.     cycloSPORINE (RESTASIS) 0.05 % ophthalmic emulsion Place 1 drop into both eyes 2 (two) times daily. 0.4 mL 0   diclofenac Sodium (VOLTAREN) 1 % GEL Apply 2 g topically 4 (four) times daily. 2 g 0   Elderberry 500 MG CAPS Take by mouth.     empagliflozin (JARDIANCE) 25 MG TABS tablet Take by mouth daily.     esomeprazole (NEXIUM) 40 MG capsule Take 1 capsule (40 mg total) by mouth 2 (two) times daily. 60 capsule 0   ezetimibe (ZETIA) 10 MG tablet TAKE 1 TABLET BY MOUTH EVERY DAY 90 tablet 1   furosemide (LASIX) 40 MG tablet Take 40 mg by mouth daily as needed.     GAMUNEX-C 40 GM/400ML SOLN q 6 weeks     Insulin Disposable Pump (OMNIPOD DASH 5 PACK PODS) MISC USE UNDER THE SKIN AS DIRECTED EVERY 1 TO 2 DAYS     insulin lispro (HUMALOG) 100 UNIT/ML cartridge Inject into the skin 3 (three) times daily with meals. On sliding scale     levothyroxine  (SYNTHROID) 150 MCG tablet Take 150 mcg by mouth daily before breakfast.     lidocaine (LIDODERM) 5 % Place 1 patch onto the skin daily. Remove & Discard patch within 12 hours or as directed by MD 30 patch 0   loratadine (CLARITIN) 10 MG tablet Take 1 tablet (10 mg total) by mouth at bedtime. 30 tablet 0   meclizine (ANTIVERT) 25 MG tablet Take 25 mg by mouth 3 (three) times daily as needed for dizziness (or migraine-related nausea).      montelukast (SINGULAIR) 10 MG tablet Take 1 tablet (10 mg total) by mouth every morning. 30 tablet 0   olmesartan (BENICAR) 20 MG tablet Take 1 tablet (20 mg total) by mouth daily. 30 tablet 0   predniSONE (DELTASONE) 10 MG tablet Take 10 mg by mouth every morning.     PRESCRIPTION MEDICATION See admin instructions. CPAP- At bedtime     PROAIR HFA 108 (90 BASE) MCG/ACT inhaler Inhale 2 puffs into the lungs every 6 (six) hours as needed for wheezing or shortness of breath.      riTUXimab (RITUXAN) 500 MG/50ML injection Inject 1,000 mg into the vein See admin instructions. 1,000 mg via IV, then another 1,000 mg in two weeks- repeat after 6 months: "riTUXimab (RITUXAN) 1,000 mg in sodium chloride 0.9% 1,000 mL infusion- Begin infusion at 50 mL/hr, if no hypersensitivity or infusion-related events occur, may increase by 50 mL/hr every 30 minutes to a maximum infusion rate of 400 mL/hr"     rosuvastatin (CRESTOR) 40 MG tablet Take 1 tablet (40 mg total) by mouth daily. 90 tablet 3   scopolamine (TRANSDERM-SCOP) 1 MG/3DAYS Place 1 patch onto the skin every 3 (three) days.     spironolactone (ALDACTONE) 25 MG tablet Take 1 tablet (25 mg total) by mouth daily. 30 tablet 0   vitamin B-12 (CYANOCOBALAMIN) 1000 MCG tablet Take 1,000 mcg by mouth daily.     Vitamin  D, Ergocalciferol, (DRISDOL) 50000 UNITS CAPS capsule Take 50,000 Units by mouth every Monday.      Biotin POWD Take 1 tablet by mouth See admin instructions.     diphenhydrAMINE (BENADRYL ALLERGY) 25 mg capsule Take  1 tablet by mouth daily.     Elderberry 500 MG CAPS Take 1 tablet by mouth See admin instructions.     No current facility-administered medications on file prior to visit.    Allergies:   Allergies  Allergen Reactions   Contrast Media [Iodinated Diagnostic Agents] Anaphylaxis    01-10-15---PT GIVEN 13 HR PRE MEDS FOR CT--PT TOLERATED IV CONTRAST W/O ANY REACTION------[name] RT-R CT   Fluorescein Shortness Of Breath and Other (See Comments)    (Dye)   Iodine Anaphylaxis    Contrast dye - iodine   Molds & Smuts Shortness Of Breath and Other (See Comments)    Congestion and wheezing, also   Shellfish-Derived Products Anaphylaxis, Shortness Of Breath, Swelling and Other (See Comments)    Welts, also   Azithromycin Nausea Only   Codeine Hives and Nausea Only   Metformin Hcl Er Nausea Only   Tramadol Hcl Nausea Only   Other Rash and Other (See Comments)    Coban causes welts, also    Physical Exam General: Mildly obese middle-aged African-American lady seated, in no evident distress Head: head normocephalic and atraumatic.   Neck: supple with no carotid or supraclavicular bruits Cardiovascular: regular rate and rhythm, no murmurs Musculoskeletal: no deformity.  Wearing a right wrist splint. Skin:  no rash/petichiae left eye swollen due to stye Vascular:  Normal pulses all extremities  Neurologic Exam Mental Status: Awake and fully alert. Oriented to place and time. Recent and remote memory intact. Attention span, concentration and fund of knowledge appropriate. Mood and affect appropriate.  Cranial Nerves: Fundoscopic exam not done.  Pupils equal, briskly reactive to light. Extraocular movements full without nystagmus but complains of subjective diplopia on upgaze..  Bilateral resting ptosis left greater than right .  Vision acuity is diminished in the right eye.  Visual fields full to confrontation in the left eye but diminished in the right.. Hearing intact. Facial sensation  intact. Face, tongue, palate moves normally and symmetrically.  Motor: Normal bulk and tone. Normal strength in all tested extremity muscles.  Mild fine action tremor of outstretched upper extremities right greater than left. Sensory.:  Subjective diminished right hemibody touch , pinprick , position and vibratory sensation.  With splitting of the midline to touch and forehead to vibration. Coordination: Rapid alternating movements normal in all extremities. Finger-to-nose and heel-to-shin performed accurately bilaterally. Gait and Station: Arises from chair without difficulty.  Uses a walker.  Stance is slightly broad-based. Gait is cautious but with reasonable balance with walker.. Able to heel, toe and tandem walk with moderate t difficulty.  Reflexes: 1+ and symmetric. Toes downgoing.      ASSESSMENT: 70 year old African-American lady with  right face paresthesias as well as intermittent headaches which have shown good response to Topamax but she is having some trouble tolerating twice daily dose..  Remote history of left thalamic lacunar infarct from small vessel disease.  Vascular risk factors of diabetes, hypertension, hyperlipidemia, obesity and sleep apnea.  She also has seronegative ocular myasthenia which appears stable and well controlled on current medication regimen.     PLAN: I had a long discussion with patient and her sister regarding her headaches which appear quite stable at the moment and so recommend reducing the dose of  Topamax to 50 mg at night as its causing some daytime sleepiness.  She will continue on aspirin for stroke prevention and maintain aggressive risk factor modification with strict control of hypertension with blood pressure goal below 130/90, lipids with LDL cholesterol goal below 70 mg percent and diabetes with hemoglobin A1c goal below 6.5%.  She is also advised to continue follow-up for myasthenia with her neurologist at [facility].  Patient wants to drive advised  her to increase gradually as tolerated.  She will return for follow-up in the future in 6 months with my nurse practitioner on call earlier if necessary. Greater than 50% time during this 30 minute visit was spent on counseling and coordination of care about her facial paresthesias and new headaches as well as remote stroke and ocular myasthenia discussion and answering questions. Antony Contras, MD Note: This document was prepared with digital dictation and possible smart phrase technology. Any transcriptional errors that result from this process are unintentional.

## 2021-08-06 NOTE — Patient Instructions (Signed)
I had a long discussion with patient and her sister regarding her headaches which appear quite stable at the moment and so recommend reducing the dose of Topamax to 50 mg at night as its causing some daytime sleepiness.  She will continue on aspirin for stroke prevention and maintain aggressive risk factor modification with strict control of hypertension with blood pressure goal below 130/90, lipids with LDL cholesterol goal below 70 mg percent and diabetes with hemoglobin A1c goal below 6.5%.  She is also advised to continue follow-up for myasthenia with her neurologist at [facility].  Patient wants to drive advised her to increase gradually as tolerated.  She will return for follow-up in the future in 6 months with my nurse practitioner on call earlier if necessary.

## 2021-08-07 ENCOUNTER — Ambulatory Visit: Payer: Medicare Other | Admitting: Cardiology

## 2021-08-07 ENCOUNTER — Encounter: Payer: Self-pay | Admitting: Cardiology

## 2021-08-07 ENCOUNTER — Other Ambulatory Visit: Payer: Self-pay

## 2021-08-07 VITALS — BP 147/59 | HR 91 | Temp 97.6°F | Resp 16 | Ht 68.0 in | Wt 200.0 lb

## 2021-08-07 DIAGNOSIS — I10 Essential (primary) hypertension: Secondary | ICD-10-CM

## 2021-08-07 DIAGNOSIS — E7800 Pure hypercholesterolemia, unspecified: Secondary | ICD-10-CM | POA: Diagnosis not present

## 2021-08-07 DIAGNOSIS — I6523 Occlusion and stenosis of bilateral carotid arteries: Secondary | ICD-10-CM | POA: Diagnosis not present

## 2021-08-07 MED ORDER — SPIRONOLACTONE-HCTZ 25-25 MG PO TABS
1.0000 | ORAL_TABLET | ORAL | 1 refills | Status: DC
Start: 2021-08-07 — End: 2021-10-27

## 2021-08-07 NOTE — Progress Notes (Signed)
Primary Physician/Referring:  [name] Morning, DO  Patient ID: [name], female    DOB: Oct 14, 1950, 70 y.o.   MRN: [id number]  Chief Complaint  Patient presents with   Hypertension   Follow-up   Hyperlipidemia   HPI:    [name]  is a 70 y.o. African-American female  with  asymptomatic carotid stenosis, uncontrolled DM with stage 3 CKD and diabetic retinopathy,, hypertension, hyperlipidemia, dyspnea on exertion,  thyroid nodules, follows Dr. 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  I seen her 2 months ago for questionable weight gain and fluctuation in blood pressure including low blood pressure.  This was initiated by insurance [name] nursing.  However on exam she was normal with no hypertension, blood pressure was well controlled.  Today she returns to discuss carotid duplex.  Dyspnea is very mild and stable, no PND or orthopnea, no leg edema.  She has been gradually gaining weight, over the past 3 months she has gained about 10 pounds in weight.  No symptoms to suggest TIA.  States that her weight has been relatively stable.  Past Medical History:  Diagnosis Date   Arthritis    "all over my body" (03/13/2013)   Asthma    Asymptomatic carotid artery stenosis, bilateral 10/08/2018   GERD (gastroesophageal reflux disease)    H/O hiatal hernia    Heart murmur    Hypercholesterolemia 10/08/2018   Hypertension    Hypothyroidism    Myasthenia gravis ([name])    "in my eyes; diagnsosed > 7 yr ago" (03/13/2013)   Sleep apnea    on CPAP   Thyroid carcinoma ([name])    Type II diabetes mellitus ([name])    Past Surgical History:  Procedure Laterality Date   ABDOMINAL HYSTERECTOMY     ANTERIOR CERVICAL DECOMP/DISCECTOMY FUSION     "I've had severa ORs; always went in from the front" (03/13/2013)   [place]     "several" (03/13/2013)   CARPAL TUNNEL RELEASE Right    CATARACT EXTRACTION W/ INTRAOCULAR LENS  IMPLANT, BILATERAL Bilateral    CHOLECYSTECTOMY     KNEE ARTHROSCOPY Left    SHOULDER ARTHROSCOPY W/ ROTATOR CUFF REPAIR Left    TONSILLECTOMY     TOTAL THYROIDECTOMY     Social History   Socioeconomic History   Marital status: Single    Spouse name: Not on file   Number of children: 0   Years of education: college   Highest education level: Not on file  Occupational History   Occupation: retired  Tobacco Use   Smoking status: Never   Smokeless tobacco: Never  Vaping Use   Vaping Use: Never used  Substance and Sexual Activity   Alcohol use: No    Alcohol/week: 0.0 standard drinks   Drug use: No   Sexual activity: Never  Other Topics Concern   Not on file  Social History Narrative   Lives alone   Right handed   Drinks no caffeine   Social Determinants of Health   Financial Resource Strain: Not on file  Food Insecurity: Not on file  Transportation Needs: Not on file  Physical Activity: Not on file  Stress: Not on file  Social Connections: Not on file  Intimate Partner Violence: Not on file   ROS  Review of Systems  Cardiovascular:  Positive  for dyspnea on exertion (Chronic and stable). Negative for chest pain and leg swelling.  Musculoskeletal:  Positive for back pain and muscle weakness.  Gastrointestinal:  Negative for melena.  Neurological:  Positive for focal weakness (right arm and leg), loss of balance and numbness (right arm).  Objective  Blood pressure (!) 147/59, pulse 91, temperature 97.6 F (36.4 C), resp. rate 16, height '5\' 8"'  (1.727 m), weight 200 lb (90.7 kg), SpO2 97 %. Body mass index is 30.41 kg/m.  Vitals with BMI 08/07/2021 08/06/2021 05/29/2021  Height '5\' 8"'  '5\' 8"'  '5\' 3"'   Weight 200 lbs 198 lbs 8 oz 190 lbs  BMI 30.42 31.51 76.16  Systolic 073 710 626  Diastolic 59 68 66  Pulse 91 89 88    Orthostatic VS for the  past 72 hrs (Last 3 readings):  Patient Position BP Location Cuff Size  08/07/21 0824 Sitting Left Arm Normal   Physical Exam Constitutional:      General: She is not in acute distress.    Appearance: She is well-developed. She is obese.     Comments: Mildly obese  Neck:     Thyroid: No thyromegaly.     Vascular: Carotid bruit (right carotid bruit) present. No JVD.  Cardiovascular:     Rate and Rhythm: Normal rate and regular rhythm.     Pulses:          Radial pulses are 2+ on the right side and 2+ on the left side.       Femoral pulses are 2+ on the right side.      Popliteal pulses are 0 on the right side and 0 on the left side.       Dorsalis pedis pulses are 1+ on the right side and 1+ on the left side.       Posterior tibial pulses are 0 on the right side and 0 on the left side.     Heart sounds: Normal heart sounds. No murmur heard.   No gallop.     Comments: popliteal pulse difficult to feel due to patient's body habitus.  Pulmonary:     Effort: Pulmonary effort is normal.     Breath sounds: Normal breath sounds.  Abdominal:     General: Bowel sounds are normal.     Palpations: Abdomen is soft.  Musculoskeletal:     Cervical back: Neck supple.     Right lower leg: No edema.     Left lower leg: No edema.   Radiology: No results found.  Laboratory examination:   No results for input(s): NA, K, CL, CO2, GLUCOSE, BUN, CREATININE, CALCIUM, GFRNONAA, GFRAA in the last 8760 hours. CMP Latest Ref Rng & Units 12/28/2019 12/23/2019 12/19/2019  Glucose 70 - 99 mg/dL 308(H) 467(H) 124(H)  BUN 8 - 23 mg/dL 20 - 20  Creatinine 0.44 - 1.00 mg/dL 0.83 - 0.69  Sodium 135 - 145 mmol/L 137 - 140  Potassium 3.5 - 5.1 mmol/L 3.8 - 3.3(L)  Chloride 98 - 111 mmol/L 109 - 106  CO2 22 - 32 mmol/L 21(L) - 24  Calcium 8.9 - 10.3 mg/dL 9.0 - 10.1  Total Protein 6.5 - 8.1 g/dL 6.2(L) - 7.8  Total Bilirubin 0.3 - 1.2 mg/dL 0.4 - 0.5  Alkaline Phos 38 - 126 U/L 89 - 98  AST 15 - 41 U/L 38 -  22  ALT 0 - 44 U/L 33 - 24   CBC Latest Ref Rng & Units 12/28/2019 12/19/2019 03/14/2013  WBC 4.0 - 10.5 K/uL 6.6 6.7 6.2  Hemoglobin 12.0 - 15.0 g/dL 10.0(L) 11.8(L) 10.0(L)  Hematocrit 36.0 - 46.0 % 31.7(L) 36.2 29.2(L)  Platelets 150 - 400 K/uL 254 261 196   Lipid Panel Recent Labs    01/01/21 1202 03/14/21 0821 05/29/21 0949  CHOL 234* 161 198  TRIG 157* 118 179*  LDLCALC 133* 91 96  HDL 74 49 72  LDLDIRECT 140*  --  96     Lipid Panel     Component Value Date/Time   CHOL 198 05/29/2021 0949   TRIG 179 (H) 05/29/2021 0949   HDL 72 05/29/2021 0949   CHOLHDL 4.3 12/21/2019 0618   VLDL 47 (H) 12/21/2019 0618   LDLCALC 96 05/29/2021 0949   LDLDIRECT 96 05/29/2021 0949   HEMOGLOBIN A1C Lab Results  Component Value Date   HGBA1C 9.1 (H) 12/20/2019   MPG 214.47 12/20/2019   BNP (last 3 results) Recent Labs    05/26/21 1041  BNP 7.5    ProBNP (last 3 results) No results for input(s): PROBNP in the last 8760 hours.  External labs:  Labs 12/26/2020:  Hb 10.3/HCT 32.4, platelets 209, normal indicis.  Sodium 135, potassium 3.8, BUN 34, creatinine 1.1, EGFR 55 mL.  Serum glucose 327 mg.  CMP otherwise normal.  TSH normal at 0.33.  A1c 9.0%.  Vitamin D 36.1.  Labs 02/05/2020:  Total cholesterol 272, triglycerides 203, HDL 77, LDL 159.  Medications   Current Outpatient Medications on File Prior to Visit  Medication Sig Dispense Refill   acetaminophen (TYLENOL) 325 MG tablet Take 2 tablets (650 mg total) by mouth every 4 (four) hours as needed for mild pain (or temp > 37.5 C (99.5 F)).     aspirin EC 81 MG EC tablet Take 1 tablet (81 mg total) by mouth daily.     B-D ULTRAFINE III SHORT PEN 31G X 8 MM MISC      Biotin 5000 MCG CAPS Take 5,000 mcg by mouth daily with breakfast.      Biotin POWD Take 1 tablet by mouth See admin instructions.     BYDUREON BCISE 2 MG/0.85ML AUIJ Inject 2 mg into the skin every Monday.      Cholecalciferol (D2000 ULTRA STRENGTH  PO) Take by mouth.     cycloSPORINE (RESTASIS) 0.05 % ophthalmic emulsion Place 1 drop into both eyes 2 (two) times daily. 0.4 mL 0   diclofenac Sodium (VOLTAREN) 1 % GEL Apply 2 g topically 4 (four) times daily. 2 g 0   diphenhydrAMINE (BENADRYL) 25 mg capsule Take 1 tablet by mouth daily.     Elderberry 500 MG CAPS Take by mouth.     Elderberry 500 MG CAPS Take 1 tablet by mouth See admin instructions.     empagliflozin (JARDIANCE) 25 MG TABS tablet Take by mouth daily.     esomeprazole (NEXIUM) 40 MG capsule Take 1 capsule (40 mg total) by mouth 2 (two) times daily. 60 capsule 0   ezetimibe (ZETIA) 10 MG tablet TAKE 1 TABLET BY MOUTH EVERY DAY 90 tablet 1   furosemide (LASIX) 40 MG tablet Take 40 mg by mouth daily as needed.     GAMUNEX-C 40 GM/400ML SOLN q 6 weeks     Insulin Disposable Pump (OMNIPOD DASH 5 PACK PODS) MISC USE UNDER THE SKIN AS DIRECTED EVERY 1 TO 2 DAYS     insulin lispro (HUMALOG) 100 UNIT/ML cartridge Inject into the skin 3 (three) times daily with meals. On  sliding scale     levothyroxine (SYNTHROID) 150 MCG tablet Take 150 mcg by mouth daily before breakfast.     lidocaine (LIDODERM) 5 % Place 1 patch onto the skin daily. Remove & Discard patch within 12 hours or as directed by MD 30 patch 0   loratadine (CLARITIN) 10 MG tablet Take 1 tablet (10 mg total) by mouth at bedtime. 30 tablet 0   meclizine (ANTIVERT) 25 MG tablet Take 25 mg by mouth 3 (three) times daily as needed for dizziness (or migraine-related nausea).      montelukast (SINGULAIR) 10 MG tablet Take 1 tablet (10 mg total) by mouth every [name]. 30 tablet 0   olmesartan (BENICAR) 20 MG tablet Take 1 tablet (20 mg total) by mouth daily. 30 tablet 0   predniSONE (DELTASONE) 10 MG tablet Take 10 mg by mouth every [name].     PRESCRIPTION MEDICATION See admin instructions. CPAP- At bedtime     PROAIR HFA 108 (90 BASE) MCG/ACT inhaler Inhale 2 puffs into the lungs every 6 (six) hours as needed for wheezing or  shortness of breath.      riTUXimab (RITUXAN) 500 MG/50ML injection Inject 1,000 mg into the vein See admin instructions. 1,000 mg via IV, then another 1,000 mg in two weeks- repeat after 6 months: "riTUXimab (RITUXAN) 1,000 mg in sodium chloride 0.9% 1,000 mL infusion- Begin infusion at 50 mL/hr, if no hypersensitivity or infusion-related events occur, may increase by 50 mL/hr every 30 minutes to a maximum infusion rate of 400 mL/hr"     rosuvastatin (CRESTOR) 40 MG tablet Take 1 tablet (40 mg total) by mouth daily. 90 tablet 3   scopolamine (TRANSDERM-SCOP) 1 MG/3DAYS Place 1 patch onto the skin every 3 (three) days.     topiramate (TOPAMAX) 50 MG tablet Take 1 tablet (50 mg total) by mouth daily. Start 1 tablet daily x 1 week and then twice daily 180 tablet 0   vitamin B-12 (CYANOCOBALAMIN) 1000 MCG tablet Take 1,000 mcg by mouth daily.     Vitamin D, Ergocalciferol, (DRISDOL) 50000 UNITS CAPS capsule Take 50,000 Units by mouth every Monday.      No current facility-administered medications on file prior to visit.    Radiology:   MR angio of the head 12/21/2019: Atherosclerotic disease right middle cerebral artery branch otherwise negative. No large vessel occlusion.  MRA chest with and without contrast 12/20/2019: 1. No evidence of acute aortic dissection, aortic aneurysm or other acute vascular abnormality. 2. 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 3. Heterogeneous and irregular/ulcerated atherosclerotic plaque along the descending thoracic aorta. 4. Concentric hypertrophy of the left ventricular myocardium suggests chronic systolic hypertension.  MRI of the brain 11/09/2020: 1. No acute intracranial abnormality. Expected evolution of the left thalamic lacunar infarct seen last year and otherwise normal for age noncontrast MRI appearance of the brain. 2. Prior cervical ACDF with adjacent segment disease  resulting in significant spinal stenosis at C2-C3. Query myelopathic symptoms. Dedicated Cervical Spine MRI would be valuable.  Cardiac Studies:   Lexiscan myoview stress test 06/25/2017: 1. Resting EKG NSR. Stress EKG: Non diagnostic for ischemia due to pharmacologic stress testing. No ST-T changes of ischemia. 8/10 chest pain with Lexiscan infusion. 2. Myocardial perfusion study was normal, without evidence of ischemia, ejection fraction 70%. Low risk study.  Echocardiogram 12/20/2019:  1. Left ventricular ejection fraction, by estimation, is 60 to 65%. The left ventricle has normal function. The  left ventricle has no regional wall motion abnormalities. Left ventricular diastolic parameters are consistent with Grade I diastolic dysfunction (impaired relaxation). 2. Right ventricular systolic function is normal. The right ventricular size is normal. 3. Left atrial size was moderately dilated. 4. The mitral valve is grossly normal. Trivial mitral valve regurgitation. No evidence of mitral stenosis. 5. Mild calcification of the non coronary cusp.. The aortic valve is tricuspid. Aortic valve regurgitation is not visualized. No aortic stenosis is present. 6. The inferior vena cava is normal in size with greater than 50% respiratory variability,suggesting right atrial pressure of 3 mmHg.  Lower Extremity Venous Duplex  12/20/2019: RIGHT:  - There is no evidence of deep vein thrombosis in the lower extremity.  - No cystic structure found in the popliteal fossa.  LEFT:  - There is no evidence of deep vein thrombosis in the lower extremity.  - No cystic structure found in the popliteal fossa.   Carotid artery duplex 07/29/2021: Duplex suggests stenosis in the right internal carotid artery (>=70%). The right PSV internal/common carotid artery ratio of 4.73 is consistent with a stenosis of >70%.   Duplex suggests stenosis in the left internal carotid artery (50-69%). Right vertebral artery flow is  not visualized. Left vertebral artery flow is not visualized. No significant change from 01/07/2021. Follow up in six months is appropriate if clinically indicated.  EKG:  EKG 05/29/2021: Normal sinus rhythm at rate of 89 bpm, normal axis.  Nonspecific T abnormality.  No significant change from 01/01/2021.   Assessment     ICD-10-CM   1. Essential hypertension  I10 spironolactone-hydrochlorothiazide (ALDACTAZIDE) 25-25 MG tablet    2. Asymptomatic carotid artery stenosis, bilateral  I65.23     3. Hypercholesterolemia  E78.00       No orders of the defined types were placed in this encounter.  Meds ordered this encounter  Medications   spironolactone-hydrochlorothiazide (ALDACTAZIDE) 25-25 MG tablet    Sig: Take 1 tablet by mouth every [name].    Dispense:  90 tablet    Refill:  1    Discontinue plain aldactone    Medications Discontinued During This Encounter  Medication Reason   spironolactone (ALDACTONE) 25 MG tablet Change in therapy      Recommendations:   [name]  is a 70 y.o. African-American female  with  asymptomatic carotid stenosis, uncontrolled DM with stage 3 CKD and diabetic retinopathy,, hypertension, hyperlipidemia, dyspnea on exertion,  thyroid nodules, follows Dr. 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  I seen her 2 months ago for questionable weight gain and fluctuation in blood pressure including low blood pressure.  This was initiated by insurance [name] nursing.  However on exam she was normal with no hypertension, blood pressure was well controlled.  Today she returns to discuss carotid duplex, no change in carotid stenosis.  I also reviewed her lipid profile testing, LDL is not at goal.  Although LDL has improved on high intensity statin and Zetia, not at goal.  Options would  be to start her on PCSK9 inhibitors or start Leqvio is an inpatient diagnosis.  Patient would also be a candidate for prevail-ASCVD trial with Obcetrapib 10 mg p.o. daily randomized trial.   I discussed extensively with the patient regarding the trial versus standard of care, discussed that she will continue with high intensity statin along with Zetia  but this will be in addition, she prefers to go on research trial with prevail-ASCVD trial.  Should be screened on her next office visit in 6 weeks.  Blood pressure is elevated today, will discontinue plain spironolactone and switch her to Aldactazide 25/25 mg in the [name].  She is presently taking furosemide on a as needed basis, she will continue the same for leg edema or weight gain.  I would like to see her back in 6 weeks for follow-up.  I suspect 10 pound weight gain over the past 2 months is probably etiology for elevated blood pressure today.  Weight loss again discussed with the patient.    Adrian Prows, MD, [facility] 08/07/2021, 10:06 AM Office: [phone] Fax: [phone] Pager: [phone]

## 2021-08-08 ENCOUNTER — Telehealth: Payer: Self-pay | Admitting: Neurology

## 2021-08-08 DIAGNOSIS — R269 Unspecified abnormalities of gait and mobility: Secondary | ICD-10-CM

## 2021-08-08 DIAGNOSIS — G4733 Obstructive sleep apnea (adult) (pediatric): Secondary | ICD-10-CM | POA: Diagnosis not present

## 2021-08-08 DIAGNOSIS — J45909 Unspecified asthma, uncomplicated: Secondary | ICD-10-CM | POA: Diagnosis not present

## 2021-08-08 NOTE — Telephone Encounter (Signed)
Pt called wanting an order put in for a 4 prong cane. Please advise.

## 2021-08-08 NOTE — Telephone Encounter (Signed)
Per vo by Dr. Krista Blue, okay to provide patient with rx for four prong cane. Prescription printed, signed and placed in mail to home address. Left voicemail with this update.

## 2021-08-20 ENCOUNTER — Ambulatory Visit: Payer: Medicare Other | Admitting: Cardiology

## 2021-08-21 NOTE — Telephone Encounter (Signed)
I called [place] for the fax number. New rx for four prong cane printed, signed and faxed to [phone]. Left patient a message with this update.

## 2021-08-21 NOTE — Addendum Note (Signed)
Addended by: Noberto Retort C on: 08/21/2021 03:45 PM   Modules accepted: Orders

## 2021-08-21 NOTE — Telephone Encounter (Signed)
Pt has called to report that her DME([facility]) will only acknowledge a faxed copy of the Rx for the four prong cane.  Pt does not have the fax# for [facility] but their phone # is [phone]

## 2021-09-04 DIAGNOSIS — G7000 Myasthenia gravis without (acute) exacerbation: Secondary | ICD-10-CM | POA: Diagnosis not present

## 2021-09-16 ENCOUNTER — Encounter (HOSPITAL_BASED_OUTPATIENT_CLINIC_OR_DEPARTMENT_OTHER): Payer: Self-pay | Admitting: Urology

## 2021-09-16 ENCOUNTER — Other Ambulatory Visit: Payer: Self-pay

## 2021-09-16 ENCOUNTER — Emergency Department (HOSPITAL_BASED_OUTPATIENT_CLINIC_OR_DEPARTMENT_OTHER): Payer: Medicare Other

## 2021-09-16 ENCOUNTER — Inpatient Hospital Stay (HOSPITAL_BASED_OUTPATIENT_CLINIC_OR_DEPARTMENT_OTHER)
Admission: EM | Admit: 2021-09-16 | Discharge: 2021-09-27 | DRG: 035 | Disposition: A | Discharge status: Home Health | Payer: Medicare Other | Attending: Family Medicine | Admitting: Family Medicine

## 2021-09-16 DIAGNOSIS — Z888 Allergy status to other drugs, medicaments and biological substances status: Secondary | ICD-10-CM | POA: Diagnosis not present

## 2021-09-16 DIAGNOSIS — Z7982 Long term (current) use of aspirin: Secondary | ICD-10-CM

## 2021-09-16 DIAGNOSIS — E7800 Pure hypercholesterolemia, unspecified: Secondary | ICD-10-CM | POA: Diagnosis present

## 2021-09-16 DIAGNOSIS — Z981 Arthrodesis status: Secondary | ICD-10-CM

## 2021-09-16 DIAGNOSIS — R251 Tremor, unspecified: Secondary | ICD-10-CM | POA: Diagnosis present

## 2021-09-16 DIAGNOSIS — K219 Gastro-esophageal reflux disease without esophagitis: Secondary | ICD-10-CM | POA: Diagnosis present

## 2021-09-16 DIAGNOSIS — Z79890 Hormone replacement therapy (postmenopausal): Secondary | ICD-10-CM | POA: Diagnosis not present

## 2021-09-16 DIAGNOSIS — Z881 Allergy status to other antibiotic agents status: Secondary | ICD-10-CM | POA: Diagnosis not present

## 2021-09-16 DIAGNOSIS — I152 Hypertension secondary to endocrine disorders: Secondary | ICD-10-CM | POA: Diagnosis not present

## 2021-09-16 DIAGNOSIS — Z801 Family history of malignant neoplasm of trachea, bronchus and lung: Secondary | ICD-10-CM | POA: Diagnosis not present

## 2021-09-16 DIAGNOSIS — E1169 Type 2 diabetes mellitus with other specified complication: Secondary | ICD-10-CM | POA: Diagnosis not present

## 2021-09-16 DIAGNOSIS — I10 Essential (primary) hypertension: Secondary | ICD-10-CM | POA: Diagnosis not present

## 2021-09-16 DIAGNOSIS — R079 Chest pain, unspecified: Secondary | ICD-10-CM | POA: Diagnosis not present

## 2021-09-16 DIAGNOSIS — R2990 Unspecified symptoms and signs involving the nervous system: Secondary | ICD-10-CM | POA: Diagnosis not present

## 2021-09-16 DIAGNOSIS — G4733 Obstructive sleep apnea (adult) (pediatric): Secondary | ICD-10-CM | POA: Diagnosis present

## 2021-09-16 DIAGNOSIS — E785 Hyperlipidemia, unspecified: Secondary | ICD-10-CM | POA: Diagnosis not present

## 2021-09-16 DIAGNOSIS — E119 Type 2 diabetes mellitus without complications: Secondary | ICD-10-CM | POA: Diagnosis not present

## 2021-09-16 DIAGNOSIS — Z833 Family history of diabetes mellitus: Secondary | ICD-10-CM

## 2021-09-16 DIAGNOSIS — G459 Transient cerebral ischemic attack, unspecified: Secondary | ICD-10-CM

## 2021-09-16 DIAGNOSIS — Z823 Family history of stroke: Secondary | ICD-10-CM

## 2021-09-16 DIAGNOSIS — I672 Cerebral atherosclerosis: Secondary | ICD-10-CM | POA: Diagnosis not present

## 2021-09-16 DIAGNOSIS — Z91013 Allergy to seafood: Secondary | ICD-10-CM | POA: Diagnosis not present

## 2021-09-16 DIAGNOSIS — E1165 Type 2 diabetes mellitus with hyperglycemia: Secondary | ICD-10-CM | POA: Diagnosis present

## 2021-09-16 DIAGNOSIS — I69351 Hemiplegia and hemiparesis following cerebral infarction affecting right dominant side: Secondary | ICD-10-CM

## 2021-09-16 DIAGNOSIS — Z885 Allergy status to narcotic agent status: Secondary | ICD-10-CM | POA: Diagnosis not present

## 2021-09-16 DIAGNOSIS — M159 Polyosteoarthritis, unspecified: Secondary | ICD-10-CM | POA: Diagnosis present

## 2021-09-16 DIAGNOSIS — R29818 Other symptoms and signs involving the nervous system: Secondary | ICD-10-CM | POA: Diagnosis not present

## 2021-09-16 DIAGNOSIS — J45909 Unspecified asthma, uncomplicated: Secondary | ICD-10-CM | POA: Diagnosis not present

## 2021-09-16 DIAGNOSIS — Z7984 Long term (current) use of oral hypoglycemic drugs: Secondary | ICD-10-CM

## 2021-09-16 DIAGNOSIS — R200 Anesthesia of skin: Secondary | ICD-10-CM

## 2021-09-16 DIAGNOSIS — E876 Hypokalemia: Secondary | ICD-10-CM | POA: Diagnosis not present

## 2021-09-16 DIAGNOSIS — J329 Chronic sinusitis, unspecified: Secondary | ICD-10-CM | POA: Diagnosis not present

## 2021-09-16 DIAGNOSIS — E039 Hypothyroidism, unspecified: Secondary | ICD-10-CM | POA: Diagnosis not present

## 2021-09-16 DIAGNOSIS — R339 Retention of urine, unspecified: Secondary | ICD-10-CM | POA: Diagnosis not present

## 2021-09-16 DIAGNOSIS — Z7952 Long term (current) use of systemic steroids: Secondary | ICD-10-CM | POA: Diagnosis not present

## 2021-09-16 DIAGNOSIS — G7000 Myasthenia gravis without (acute) exacerbation: Secondary | ICD-10-CM | POA: Diagnosis present

## 2021-09-16 DIAGNOSIS — I6523 Occlusion and stenosis of bilateral carotid arteries: Secondary | ICD-10-CM | POA: Diagnosis not present

## 2021-09-16 DIAGNOSIS — E890 Postprocedural hypothyroidism: Secondary | ICD-10-CM | POA: Diagnosis present

## 2021-09-16 DIAGNOSIS — G319 Degenerative disease of nervous system, unspecified: Secondary | ICD-10-CM | POA: Diagnosis not present

## 2021-09-16 DIAGNOSIS — E1159 Type 2 diabetes mellitus with other circulatory complications: Secondary | ICD-10-CM | POA: Diagnosis not present

## 2021-09-16 DIAGNOSIS — J341 Cyst and mucocele of nose and nasal sinus: Secondary | ICD-10-CM | POA: Diagnosis not present

## 2021-09-16 DIAGNOSIS — R0602 Shortness of breath: Secondary | ICD-10-CM | POA: Diagnosis not present

## 2021-09-16 DIAGNOSIS — Z79899 Other long term (current) drug therapy: Secondary | ICD-10-CM

## 2021-09-16 DIAGNOSIS — I6522 Occlusion and stenosis of left carotid artery: Secondary | ICD-10-CM | POA: Diagnosis not present

## 2021-09-16 DIAGNOSIS — E1151 Type 2 diabetes mellitus with diabetic peripheral angiopathy without gangrene: Secondary | ICD-10-CM | POA: Diagnosis not present

## 2021-09-16 DIAGNOSIS — Z20822 Contact with and (suspected) exposure to covid-19: Secondary | ICD-10-CM | POA: Diagnosis not present

## 2021-09-16 DIAGNOSIS — Z91041 Radiographic dye allergy status: Secondary | ICD-10-CM | POA: Diagnosis not present

## 2021-09-16 DIAGNOSIS — R0789 Other chest pain: Secondary | ICD-10-CM | POA: Diagnosis not present

## 2021-09-16 DIAGNOSIS — Z825 Family history of asthma and other chronic lower respiratory diseases: Secondary | ICD-10-CM

## 2021-09-16 DIAGNOSIS — I639 Cerebral infarction, unspecified: Secondary | ICD-10-CM | POA: Diagnosis not present

## 2021-09-16 DIAGNOSIS — Z8249 Family history of ischemic heart disease and other diseases of the circulatory system: Secondary | ICD-10-CM | POA: Diagnosis not present

## 2021-09-16 DIAGNOSIS — K5900 Constipation, unspecified: Secondary | ICD-10-CM | POA: Diagnosis not present

## 2021-09-16 DIAGNOSIS — Z9641 Presence of insulin pump (external) (internal): Secondary | ICD-10-CM | POA: Diagnosis present

## 2021-09-16 DIAGNOSIS — Z85850 Personal history of malignant neoplasm of thyroid: Secondary | ICD-10-CM

## 2021-09-16 DIAGNOSIS — M4322 Fusion of spine, cervical region: Secondary | ICD-10-CM | POA: Diagnosis not present

## 2021-09-16 DIAGNOSIS — Z794 Long term (current) use of insulin: Secondary | ICD-10-CM

## 2021-09-16 DIAGNOSIS — Z8673 Personal history of transient ischemic attack (TIA), and cerebral infarction without residual deficits: Secondary | ICD-10-CM | POA: Diagnosis not present

## 2021-09-16 LAB — CBC
HCT: 37.9 % (ref 36.0–46.0)
Hemoglobin: 12.4 g/dL (ref 12.0–15.0)
MCH: 30 pg (ref 26.0–34.0)
MCHC: 32.7 g/dL (ref 30.0–36.0)
MCV: 91.5 fL (ref 80.0–100.0)
Platelets: 224 10*3/uL (ref 150–400)
RBC: 4.14 MIL/uL (ref 3.87–5.11)
RDW: 13.7 % (ref 11.5–15.5)
WBC: 4.3 10*3/uL (ref 4.0–10.5)
nRBC: 0 % (ref 0.0–0.2)

## 2021-09-16 LAB — RESP PANEL BY RT-PCR (FLU A&B, COVID) ARPGX2
Influenza A by PCR: NEGATIVE
Influenza B by PCR: NEGATIVE
SARS Coronavirus 2 by RT PCR: NEGATIVE

## 2021-09-16 LAB — BASIC METABOLIC PANEL
Anion gap: 12 (ref 5–15)
BUN: 14 mg/dL (ref 8–23)
CO2: 24 mmol/L (ref 22–32)
Calcium: 9.6 mg/dL (ref 8.9–10.3)
Chloride: 100 mmol/L (ref 98–111)
Creatinine, Ser: 1.1 mg/dL — ABNORMAL HIGH (ref 0.44–1.00)
GFR, Estimated: 55 mL/min — ABNORMAL LOW (ref >60)
Glucose, Bld: 239 mg/dL — ABNORMAL HIGH (ref 70–99)
Potassium: 3 mmol/L — ABNORMAL LOW (ref 3.5–5.1)
Sodium: 136 mmol/L (ref 135–145)

## 2021-09-16 LAB — TROPONIN I (HIGH SENSITIVITY)
Troponin I (High Sensitivity): 9 ng/L (ref <18)
Troponin I (High Sensitivity): 8 ng/L (ref <18)

## 2021-09-16 MED ORDER — ACETAMINOPHEN 500 MG PO TABS
1000.0000 mg | ORAL_TABLET | Freq: Once | ORAL | Status: AC
Start: 2021-09-16 — End: 2021-09-16
  Administered 2021-09-16: 18:00:00 1000 mg via ORAL
  Filled 2021-09-16: qty 2

## 2021-09-16 NOTE — ED Notes (Addendum)
Spoke with [name] at [facility] for Hospitalist/neuro consult

## 2021-09-16 NOTE — H&P (Signed)
History and Physical    [name] EPP:[id number] DOB: April 07, 1951 DOA: 09/16/2021  PCP: [name], DO  Patient coming from: Home via [place] ED  I have personally briefly reviewed patient's old medical records in [place] [facility]  Chief Complaint: Right facial numbness  HPI: [name] is a 71 y.o. 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  Patient states that she woke from sleep night prior to admission with a posterior headache.  Afterwards she noticed numbness in her right face.  Numbness has persisted also involving her right arm.  She has also had some mild weakness in her right arm.  She reports continued headache.  She has not had any appreciable change in vision but states that her vision is poor at baseline and has intermittent blurred vision and diplopia.  She reports recent substernal chest discomfort which has been constant.  She states she normally ambulates with the use of a walker.  She says her current symptoms are similar to when she had a prior stroke in 2021.  Patient also reports recent diarrhea.  Her neurologist at [facility] advised her to hold her CellCept for 10 days.  She says her diarrhea has since resolved after holding CellCept.  [facility] ED Course:  Initial vitals showed BP 173/70, pulse 95, RR 18, temp 98.2 F, SPO2 100% on room air.  Labs show sodium 136, potassium 3.0, bicarb 24, BUN 14, creatinine 1.10, serum glucose 239, WBC 4.3, hemoglobin 12.4, platelets 224,000, troponin I >8.  SARS-CoV-2 and influenza PCR negative.  2 view chest x-ray negative for focal consolidation, edema, effusion.  CT head  without contrast negative for acute intracranial findings.  EDP discussed with on-call neurology who recommended medical admission for further stroke work-up.  CTA head/neck not pursued due to report of severe contrast allergy.  The hospitalist service was consulted to admit for further evaluation and management.  Review of Systems: All systems reviewed and are negative except as documented in history of present illness above.   Past Medical History:  Diagnosis Date   Arthritis    "all over my body" (03/13/2013)   Asthma    Asymptomatic carotid artery stenosis, bilateral 10/08/2018   GERD (gastroesophageal reflux disease)    H/O hiatal hernia    Heart murmur    Hypercholesterolemia 10/08/2018   Hypertension    Hypothyroidism    Myasthenia gravis ([place])    "in my eyes; diagnsosed > 7 yr ago" (03/13/2013)   Sleep apnea    on CPAP   Thyroid carcinoma ([place])    Type II diabetes mellitus ([place])     Past Surgical History:  Procedure Laterality Date   ABDOMINAL HYSTERECTOMY     ANTERIOR CERVICAL DECOMP/DISCECTOMY FUSION     "I've had severa ORs; always went in from the front" (03/13/2013)   [place]     "several" (03/13/2013)   CARPAL TUNNEL RELEASE Right    CATARACT EXTRACTION W/ INTRAOCULAR LENS  IMPLANT, BILATERAL Bilateral    CHOLECYSTECTOMY     KNEE ARTHROSCOPY Left    SHOULDER ARTHROSCOPY W/ ROTATOR CUFF REPAIR Left    TONSILLECTOMY     TOTAL THYROIDECTOMY  Social History:  reports that she has never smoked. She has never used smokeless tobacco. She reports that she does not drink alcohol and does not use drugs.  Allergies  Allergen Reactions   Contrast Media [Iodinated Contrast Media] Anaphylaxis    01-10-15---PT GIVEN 13 HR PRE MEDS FOR CT--PT TOLERATED IV CONTRAST W/O ANY REACTION------[name] RT-R CT   Fluorescein Shortness Of Breath and Other (See Comments)    (Dye)   Iodine Anaphylaxis    Contrast dye - iodine   Molds & Smuts  Shortness Of Breath and Other (See Comments)    Congestion and wheezing, also   Shellfish-Derived Products Anaphylaxis, Shortness Of Breath, Swelling and Other (See Comments)    Welts, also   Azithromycin Nausea Only   Codeine Hives and Nausea Only   Metformin Hcl Er Nausea Only   Tramadol Hcl Nausea Only   Other Rash and Other (See Comments)    Coban causes welts, also    Family History  Problem Relation Age of Onset   Coronary artery disease Sister        s/p coronary stenting   Stroke Sister 57   Diabetes Sister    Congestive Heart Failure Mother    Asthma Mother    Diabetes Mother    Congestive Heart Failure Father    Lung cancer Father    Asthma Father    Diabetes Father    Diabetes Brother      Prior to Admission medications   Medication Sig Start Date End Date Taking? Authorizing Provider  acetaminophen (TYLENOL) 325 MG tablet Take 2 tablets (650 mg total) by mouth every 4 (four) hours as needed for mild pain (or temp > 37.5 C (99.5 F)). 01/08/20   Angiulli, Lavon Paganini, PA-C  aspirin EC 81 MG EC tablet Take 1 tablet (81 mg total) by mouth daily. 12/28/19   Barb Merino, MD  B-D ULTRAFINE III SHORT PEN 31G X 8 MM MISC  05/17/14   [provider]  Biotin 5000 MCG CAPS Take 5,000 mcg by mouth daily with breakfast.     [provider]  Biotin POWD Take 1 tablet by mouth See admin instructions.    [provider]  BYDUREON BCISE 2 MG/0.85ML AUIJ Inject 2 mg into the skin every Monday.  08/21/19   [provider]  Cholecalciferol (D2000 ULTRA STRENGTH PO) Take by mouth.    [provider]  cycloSPORINE (RESTASIS) 0.05 % ophthalmic emulsion Place 1 drop into both eyes 2 (two) times daily. 01/08/20   Angiulli, Lavon Paganini, PA-C  diclofenac Sodium (VOLTAREN) 1 % GEL Apply 2 g topically 4 (four) times daily. 01/08/20   Angiulli, Lavon Paganini, PA-C  diphenhydrAMINE (BENADRYL) 25 mg capsule Take 1 tablet by mouth daily.    [provider]   Elderberry 500 MG CAPS Take by mouth.    [provider]  Elderberry 500 MG CAPS Take 1 tablet by mouth See admin instructions.    [provider]  empagliflozin (JARDIANCE) 25 MG TABS tablet Take by mouth daily.    [provider]  esomeprazole (NEXIUM) 40 MG capsule Take 1 capsule (40 mg total) by mouth 2 (two) times daily. 01/08/20   Angiulli, Lavon Paganini, PA-C  ezetimibe (ZETIA) 10 MG tablet TAKE 1 TABLET BY MOUTH EVERY DAY 01/16/21   Adrian Prows, MD  furosemide (LASIX) 40 MG tablet Take 40 mg by mouth daily as needed. 02/06/20   [provider]  GAMUNEX-C 40 GM/400ML SOLN q  6 weeks 02/21/20   [provider]  Insulin Disposable Pump (OMNIPOD DASH 5 PACK PODS) MISC USE UNDER THE SKIN AS DIRECTED EVERY 1 TO 2 DAYS 01/10/20   [provider]  insulin lispro (HUMALOG) 100 UNIT/ML cartridge Inject into the skin 3 (three) times daily with meals. On sliding scale    [provider]  levothyroxine (SYNTHROID) 150 MCG tablet Take 150 mcg by mouth daily before breakfast.    [provider]  lidocaine (LIDODERM) 5 % Place 1 patch onto the skin daily. Remove & Discard patch within 12 hours or as directed by MD 01/08/20   Angiulli, Lavon Paganini, PA-C  loratadine (CLARITIN) 10 MG tablet Take 1 tablet (10 mg total) by mouth at bedtime. 01/08/20   Angiulli, Lavon Paganini, PA-C  meclizine (ANTIVERT) 25 MG tablet Take 25 mg by mouth 3 (three) times daily as needed for dizziness (or migraine-related nausea).  06/27/13   [provider]  montelukast (SINGULAIR) 10 MG tablet Take 1 tablet (10 mg total) by mouth every morning. 01/08/20   Angiulli, Lavon Paganini, PA-C  olmesartan (BENICAR) 20 MG tablet Take 1 tablet (20 mg total) by mouth daily. 01/08/20   Angiulli, Lavon Paganini, PA-C  predniSONE (DELTASONE) 10 MG tablet Take 10 mg by mouth every morning.    [provider]  PRESCRIPTION MEDICATION See admin instructions. CPAP- At bedtime    [provider]  PROAIR HFA 108 (90 BASE) MCG/ACT inhaler Inhale 2 puffs into the lungs every 6 (six) hours as needed for wheezing or shortness of breath.  06/02/13   [provider]  riTUXimab (RITUXAN) 500 MG/50ML injection Inject 1,000 mg into the vein See admin instructions. 1,000 mg via IV, then another 1,000 mg in two weeks- repeat after 6 months: "riTUXimab (RITUXAN) 1,000 mg in sodium chloride 0.9% 1,000 mL infusion- Begin infusion at 50 mL/hr, if no hypersensitivity or infusion-related events occur, may increase by 50 mL/hr every 30 minutes to a maximum infusion rate of 400 mL/hr"    [provider]  rosuvastatin (CRESTOR) 40 MG tablet Take 1 tablet (40 mg total) by mouth daily. 02/13/21 02/08/22  Adrian Prows, MD  scopolamine (TRANSDERM-SCOP) 1 MG/3DAYS Place 1 patch onto the skin every 3 (three) days.    [provider]  spironolactone-hydrochlorothiazide (ALDACTAZIDE) 25-25 MG tablet Take 1 tablet by mouth every morning. 08/07/21   Adrian Prows, MD  topiramate (TOPAMAX) 50 MG tablet Take 1 tablet (50 mg total) by mouth daily. Start 1 tablet daily x 1 week and then twice daily 08/06/21   Garvin Fila, MD  vitamin B-12 (CYANOCOBALAMIN) 1000 MCG tablet Take 1,000 mcg by mouth daily.    [provider]  Vitamin D, Ergocalciferol, (DRISDOL) 50000 UNITS CAPS capsule Take 50,000 Units by mouth every Monday.  12/12/13   [provider]    Physical Exam: Vitals:   09/16/21 1815 09/16/21 1900 09/16/21 2000 09/16/21 2210  BP: (!) 157/72 (!) 155/75 (!) 134/55 (!) 145/64  Pulse: 88 92 88 88  Resp: 17 18 19  (!) 21  Temp:   98.2 F (36.8 C)   TempSrc:   Oral   SpO2: 100% 96% 96% 97%  Weight:      Height:       Constitutional: Resting in bed, NAD, calm, comfortable Eyes: PERRL, lids and conjunctivae normal ENMT: Mucous membranes are moist. Posterior pharynx clear of any exudate or lesions.Normal dentition.  Neck: normal, supple, no masses. Respiratory:  clear to auscultation  bilaterally, no wheezing, no crackles. Normal respiratory effort. No accessory muscle use.  Cardiovascular: Regular rate and rhythm, no murmurs / rubs / gallops. No extremity edema. 2+ pedal pulses. Abdomen: no tenderness, no masses palpated. No hepatosplenomegaly. Bowel sounds positive.  Musculoskeletal: no clubbing / cyanosis. No joint deformity upper and lower extremities. Good ROM, no contractures. Normal muscle tone.  Skin: no rashes, lesions, ulcers. No induration Neurologic: Tremor right hand, CN 2-12 grossly intact. Sensation diminished right face, right arm, right leg. Strength 5/5 in all 4.  Visual fields intact. Psychiatric: Normal judgment and insight. Alert and oriented x 3. Normal mood.   Labs on Admission: I have personally reviewed following labs and imaging studies  CBC: Recent Labs  Lab 09/16/21 1528  WBC 4.3  HGB 12.4  HCT 37.9  MCV 91.5  PLT 093   Basic Metabolic Panel: Recent Labs  Lab 09/16/21 1528  NA 136  K 3.0*  CL 100  CO2 24  GLUCOSE 239*  BUN 14  CREATININE 1.10*  CALCIUM 9.6   GFR: Estimated Creatinine Clearance: 55.3 mL/min (A) (by C-G formula based on SCr of 1.1 mg/dL (H)). Liver Function Tests: No results for input(s): AST, ALT, ALKPHOS, BILITOT, PROT, ALBUMIN in the last 168 hours. No results for input(s): LIPASE, AMYLASE in the last 168 hours. No results for input(s): AMMONIA in the last 168 hours. Coagulation Profile: No results for input(s): INR, PROTIME in the last 168 hours. Cardiac Enzymes: No results for input(s): CKTOTAL, CKMB, CKMBINDEX, TROPONINI in the last 168 hours. BNP (last 3 results) No results for input(s): PROBNP in the last 8760 hours. HbA1C: No results for input(s): HGBA1C in the last 72 hours. CBG: Recent Labs  Lab 09/17/21 0031  GLUCAP 270*   Lipid Profile: No results for input(s): CHOL, HDL, LDLCALC, TRIG, CHOLHDL, LDLDIRECT in the last 72 hours. Thyroid Function Tests: No results for  input(s): TSH, T4TOTAL, FREET4, T3FREE, THYROIDAB in the last 72 hours. Anemia Panel: No results for input(s): VITAMINB12, FOLATE, FERRITIN, TIBC, IRON, RETICCTPCT in the last 72 hours. Urine analysis:    Component Value Date/Time   COLORURINE YELLOW 06/25/2009 0920   APPEARANCEUR CLEAR 06/25/2009 0920   LABSPEC 1.029 06/25/2009 0920   PHURINE 5.5 06/25/2009 0920   GLUCOSEU 250 (A) 06/25/2009 0920   HGBUR NEGATIVE 06/25/2009 0920   BILIRUBINUR NEGATIVE 06/25/2009 0920   KETONESUR NEGATIVE 06/25/2009 0920   PROTEINUR 30 (A) 06/25/2009 0920   UROBILINOGEN 0.2 06/25/2009 0920   NITRITE NEGATIVE 06/25/2009 0920   LEUKOCYTESUR NEGATIVE 06/25/2009 0920    Radiological Exams on Admission: DG Chest 2 View  Result Date: 09/16/2021 CLINICAL DATA:  Chest pain and short of breath. EXAM: CHEST - 2 VIEW COMPARISON:  12/19/2019 FINDINGS: Cardiac and mediastinal contours normal. Lungs are well aerated and clear. No infiltrate or effusion. Port-A-Cath tip in the SVC at the cavoatrial junction unchanged. IMPRESSION: No active cardiopulmonary disease. Electronically Signed   By: Franchot Gallo M.D.   On: 09/16/2021 16:07   CT Head Wo Contrast  Result Date: 09/16/2021 CLINICAL DATA:  Neurological deficit EXAM: CT HEAD WITHOUT CONTRAST TECHNIQUE: Contiguous axial images were obtained from the base of the skull through the vertex without intravenous contrast. RADIATION DOSE REDUCTION: This exam was performed according to the departmental dose-optimization program which includes automated exposure control, adjustment of the mA and/or kV according to patient size and/or use of iterative reconstruction technique. COMPARISON:  MR brain done on 11/09/2020 FINDINGS: Brain: No acute intracranial findings are seen. There  are no signs of bleeding within the cranium. Cortical sulci are prominent. There is decreased density in the periventricular white matter. Vascular: Unremarkable. Skull: Unremarkable. Sinuses/Orbits:  Mucosal thickening and mucous retention cysts are seen in the sphenoid and maxillary sinuses. Other: None IMPRESSION: No acute intracranial findings are seen.  Atrophy. Chronic sinusitis. Electronically Signed   By: Elmer Picker M.D.   On: 09/16/2021 17:14    EKG: Personally reviewed. Normal sinus rhythm without acute ischemic changes, low voltage.  Assessment/Plan Principal Problem:   Right facial numbness Active Problems:   Myasthenia gravis (HCC)   Insulin dependent type 2 diabetes mellitus ([place])   Hypertension associated with diabetes ([place])   Hypothyroidism   Hyperlipidemia associated with type 2 diabetes mellitus (HCC)   OSA (obstructive sleep apnea)   Hypokalemia   [name] is a 71 y.o. 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  * Right facial numbness- (present on admission) History of left thalamic infarct with mild right-sided residual weakness Presenting with diminished sensation right face and extremities or stroke work-up.  Seen by neurology, suspicion is for complicated migraine versus psychogenic presentation. -Follow MRI brain without contrast -Continue aspirin 81 mg daily alone for now -Keep on telemetry, continue neurochecks -Check A1c, lipid panel -PT/OT/SLP eval  Myasthenia gravis ([place])- (present on admission) Follows with [place] neurology.  CellCept recently held due to diarrhea which has since resolved.  Continue chronic prednisone 10 mg daily.  She receives Rituxan and IVIG infusions through [facility].  Insulin dependent type 2 diabetes mellitus (HCC) Wearing insulin pump however patient states insulin ran out while in the ED.  Placed on  moderate SSI in the meantime.  Hold Jardiance.  Hypertension associated with diabetes ([place])- (present on admission) Hold antihypertensives for now to allow for permissive hypertension.  Hypothyroidism- (present on admission) Continue Synthroid.  Hyperlipidemia associated with type 2 diabetes mellitus ([place])- (present on admission) Continue rosuvastatin and Zetia.  OSA (obstructive sleep apnea)- (present on admission) Continue CPAP nightly.  Hypokalemia- (present on admission) Slow orally, check magnesium.  Hold HCTZ.  DVT prophylaxis: enoxaparin (LOVENOX) injection 40 mg Start: 09/17/21 0800 Code Status: Full code, confirmed with patient on admission Family Communication: Discussed with patient, she has discussed with family Disposition Plan: From home, dispo pending clinical progress Consults called: Neurology Level of care: Telemetry Medical Admission status:   Status is: Observation  The patient remains OBS appropriate and will d/c before 2 midnights.   Zada Finders MD [facility]  If 7PM-7AM, please contact night-coverage [email or web address]  09/17/2021, 1:59 AM

## 2021-09-16 NOTE — ED Triage Notes (Signed)
Central chest pain that started yesterday radiating to back  H/o stroke with right sided deficits States "keep burping"  States shortness of breath  A&O x 4

## 2021-09-16 NOTE — ED Notes (Signed)
Spoke with Marcello Moores at [facility] for Neurology consult

## 2021-09-16 NOTE — ED Provider Notes (Signed)
[place] EMERGENCY DEPARTMENT Provider Note   CSN: [id number] Arrival date & time: 09/16/21  1521     History  Chief Complaint  Patient presents with   Chest Pain    [name] is a 71 y.o. female.  Presented to the emergency room with concern for chest pain, right facial numbness and arm numbness.  Reports that she went to bed early last night around 7 PM.  Woke up at night with the pain and numbness.  States that the symptoms have been relatively constant.  She denies any associated new vision change or speech change.  Pain is moderate, does seem to radiate to her back.  Pain felt similar to when she was diagnosed with stroke in 2021.  Reports that has some slight residual weakness on the right side, not worse than normal.  Has some issues with walking but otherwise minimal issues from her stroke.  Patient also has history of myasthenia gravis, followed by [facility].  Is on regimen of IVIG, CellCept, rituxin, chronic steroids.  10 days ago seen by her neurologist and states that she was taken off the CellCept due to concern for side effects causing diarrhea.  Additional history obtained from chart review, seronegative myasthenia gravis, hypertension, asymptomatic carotid artery stenosis, Prior stroke in April 2021.  Paraneoplastic retinopathy.  Reviewed discharge summary from 2021, left thalamic stroke, also had chest pain at the time, patient with severe anaphylaxis allergy to contrast, MRA chest ordered to rule out aortic dissection which was negative.    HPI     Home Medications Prior to Admission medications   Medication Sig Start Date End Date Taking? Authorizing Provider  acetaminophen (TYLENOL) 325 MG tablet Take 2 tablets (650 mg total) by mouth every 4 (four) hours as needed for mild pain (or temp > 37.5 C (99.5 F)). 01/08/20   Angiulli, Lavon Paganini, PA-C  aspirin EC 81 MG EC tablet Take 1 tablet (81 mg total) by mouth daily. 12/28/19   Barb Merino, MD  B-D  ULTRAFINE III SHORT PEN 31G X 8 MM MISC  05/17/14   [provider]  Biotin 5000 MCG CAPS Take 5,000 mcg by mouth daily with breakfast.     [provider]  Biotin POWD Take 1 tablet by mouth See admin instructions.    [provider]  BYDUREON BCISE 2 MG/0.85ML AUIJ Inject 2 mg into the skin every Monday.  08/21/19   [provider]  Cholecalciferol (D2000 ULTRA STRENGTH PO) Take by mouth.    [provider]  cycloSPORINE (RESTASIS) 0.05 % ophthalmic emulsion Place 1 drop into both eyes 2 (two) times daily. 01/08/20   Angiulli, Lavon Paganini, PA-C  diclofenac Sodium (VOLTAREN) 1 % GEL Apply 2 g topically 4 (four) times daily. 01/08/20   Angiulli, Lavon Paganini, PA-C  diphenhydrAMINE (BENADRYL) 25 mg capsule Take 1 tablet by mouth daily.    [provider]  Elderberry 500 MG CAPS Take by mouth.    [provider]  Elderberry 500 MG CAPS Take 1 tablet by mouth See admin instructions.    [provider]  empagliflozin (JARDIANCE) 25 MG TABS tablet Take by mouth daily.    [provider]  esomeprazole (NEXIUM) 40 MG capsule Take 1 capsule (40 mg total) by mouth 2 (two) times daily. 01/08/20   Angiulli, Lavon Paganini, PA-C  ezetimibe (ZETIA) 10 MG tablet TAKE 1 TABLET BY MOUTH EVERY DAY 01/16/21   Adrian Prows, MD  furosemide (LASIX) 40 MG  tablet Take 40 mg by mouth daily as needed. 02/06/20   [provider]  GAMUNEX-C 40 GM/400ML SOLN q 6 weeks 02/21/20   [provider]  Insulin Disposable Pump (OMNIPOD DASH 5 PACK PODS) MISC USE UNDER THE SKIN AS DIRECTED EVERY 1 TO 2 DAYS 01/10/20   [provider]  insulin lispro (HUMALOG) 100 UNIT/ML cartridge Inject into the skin 3 (three) times daily with meals. On sliding scale    [provider]  levothyroxine (SYNTHROID) 150 MCG tablet Take 150 mcg by mouth daily before breakfast.    [provider]  lidocaine (LIDODERM) 5 % Place 1 patch onto the skin  daily. Remove & Discard patch within 12 hours or as directed by MD 01/08/20   Angiulli, Lavon Paganini, PA-C  loratadine (CLARITIN) 10 MG tablet Take 1 tablet (10 mg total) by mouth at bedtime. 01/08/20   Angiulli, Lavon Paganini, PA-C  meclizine (ANTIVERT) 25 MG tablet Take 25 mg by mouth 3 (three) times daily as needed for dizziness (or migraine-related nausea).  06/27/13   [provider]  montelukast (SINGULAIR) 10 MG tablet Take 1 tablet (10 mg total) by mouth every morning. 01/08/20   Angiulli, Lavon Paganini, PA-C  olmesartan (BENICAR) 20 MG tablet Take 1 tablet (20 mg total) by mouth daily. 01/08/20   Angiulli, Lavon Paganini, PA-C  predniSONE (DELTASONE) 10 MG tablet Take 10 mg by mouth every morning.    [provider]  PRESCRIPTION MEDICATION See admin instructions. CPAP- At bedtime    [provider]  PROAIR HFA 108 (90 BASE) MCG/ACT inhaler Inhale 2 puffs into the lungs every 6 (six) hours as needed for wheezing or shortness of breath.  06/02/13   [provider]  riTUXimab (RITUXAN) 500 MG/50ML injection Inject 1,000 mg into the vein See admin instructions. 1,000 mg via IV, then another 1,000 mg in two weeks- repeat after 6 months: "riTUXimab (RITUXAN) 1,000 mg in sodium chloride 0.9% 1,000 mL infusion- Begin infusion at 50 mL/hr, if no hypersensitivity or infusion-related events occur, may increase by 50 mL/hr every 30 minutes to a maximum infusion rate of 400 mL/hr"    [provider]  rosuvastatin (CRESTOR) 40 MG tablet Take 1 tablet (40 mg total) by mouth daily. 02/13/21 02/08/22  Adrian Prows, MD  scopolamine (TRANSDERM-SCOP) 1 MG/3DAYS Place 1 patch onto the skin every 3 (three) days.    [provider]  spironolactone-hydrochlorothiazide (ALDACTAZIDE) 25-25 MG tablet Take 1 tablet by mouth every morning. 08/07/21   Adrian Prows, MD  topiramate (TOPAMAX) 50 MG tablet Take 1 tablet (50 mg total) by mouth daily. Start 1 tablet daily x 1 week and then twice daily  08/06/21   Garvin Fila, MD  vitamin B-12 (CYANOCOBALAMIN) 1000 MCG tablet Take 1,000 mcg by mouth daily.    [provider]  Vitamin D, Ergocalciferol, (DRISDOL) 50000 UNITS CAPS capsule Take 50,000 Units by mouth every Monday.  12/12/13   [provider]      Allergies    Contrast media [iodinated contrast media], Fluorescein, Iodine, Molds & smuts, Shellfish-derived products, Azithromycin, Codeine, Metformin hcl er, Tramadol hcl, and Other    Review of Systems   Review of Systems  Constitutional:  Negative for chills and fever.  HENT:  Negative for ear pain and sore throat.   Eyes:  Negative for pain and visual disturbance.  Respiratory:  Positive for chest tightness. Negative for cough and shortness of breath.   Cardiovascular:  Positive for chest  pain. Negative for palpitations.  Gastrointestinal:  Negative for abdominal pain and vomiting.  Genitourinary:  Negative for dysuria and hematuria.  Musculoskeletal:  Positive for back pain. Negative for arthralgias.  Skin:  Negative for color change and rash.  Neurological:  Negative for seizures and syncope.  All other systems reviewed and are negative.  Physical Exam Updated Vital Signs BP (!) 155/75    Pulse 92    Temp 98.2 F (36.8 C) (Oral)    Resp 18    Ht 5\' 8"  (1.727 m)    Wt 83 kg    SpO2 96%    BMI 27.83 kg/m  Physical Exam Vitals and nursing note reviewed.  Constitutional:      General: She is not in acute distress.    Appearance: She is well-developed.  HENT:     Head: Normocephalic and atraumatic.  Eyes:     Conjunctiva/sclera: Conjunctivae normal.  Cardiovascular:     Rate and Rhythm: Normal rate and regular rhythm.     Heart sounds: No murmur heard. Pulmonary:     Effort: Pulmonary effort is normal. No respiratory distress.     Breath sounds: Normal breath sounds.  Abdominal:     Palpations: Abdomen is soft.     Tenderness: There is no abdominal tenderness.  Musculoskeletal:         General: No swelling.     Cervical back: Neck supple.  Skin:    General: Skin is warm and dry.     Capillary Refill: Capillary refill takes less than 2 seconds.  Neurological:     Mental Status: She is alert.     Comments: AAOx3 CN 2-12 intact except feels sesantion decreased to right face, left visual field deficit noted in both eyes 5/5 strength in b/l UE and LE Sensation to light touch intact in LLE and LUE and RLE Sensation to light touch decreased in RUE Normal FNF  Psychiatric:        Mood and Affect: Mood normal.    ED Results / Procedures / Treatments   Labs (all labs ordered are listed, but only abnormal results are displayed) Labs Reviewed  BASIC METABOLIC PANEL - Abnormal; Notable for the following components:      Result Value   Potassium 3.0 (*)    Glucose, Bld 239 (*)    Creatinine, Ser 1.10 (*)    GFR, Estimated 55 (*)    All other components within normal limits  RESP PANEL BY RT-PCR (FLU A&B, COVID) ARPGX2  CBC  TROPONIN I (HIGH SENSITIVITY)  TROPONIN I (HIGH SENSITIVITY)    EKG EKG Interpretation  Date/Time:  Tuesday September 16 2021 15:29:27 EST Ventricular Rate:  92 PR Interval:  160 QRS Duration: 86 QT Interval:  380 QTC Calculation: 469 R Axis:   33 Text Interpretation: Normal sinus rhythm Low voltage QRS Cannot rule out Anterior infarct , age undetermined Abnormal ECG When compared with ECG of 02-Jan-2020 08:58, no significant change Confirmed by Madalyn Rob [phone]) on 09/16/2021 6:50:51 PM  Radiology DG Chest 2 View  Result Date: 09/16/2021 CLINICAL DATA:  Chest pain and short of breath. EXAM: CHEST - 2 VIEW COMPARISON:  12/19/2019 FINDINGS: Cardiac and mediastinal contours normal. Lungs are well aerated and clear. No infiltrate or effusion. Port-A-Cath tip in the SVC at the cavoatrial junction unchanged. IMPRESSION: No active cardiopulmonary disease. Electronically Signed   By: Franchot Gallo M.D.   On: 09/16/2021 16:07   CT Head Wo  Contrast  Result Date: 09/16/2021  CLINICAL DATA:  Neurological deficit EXAM: CT HEAD WITHOUT CONTRAST TECHNIQUE: Contiguous axial images were obtained from the base of the skull through the vertex without intravenous contrast. RADIATION DOSE REDUCTION: This exam was performed according to the departmental dose-optimization program which includes automated exposure control, adjustment of the mA and/or kV according to patient size and/or use of iterative reconstruction technique. COMPARISON:  MR brain done on 11/09/2020 FINDINGS: Brain: No acute intracranial findings are seen. There are no signs of bleeding within the cranium. Cortical sulci are prominent. There is decreased density in the periventricular white matter. Vascular: Unremarkable. Skull: Unremarkable. Sinuses/Orbits: Mucosal thickening and mucous retention cysts are seen in the sphenoid and maxillary sinuses. Other: None IMPRESSION: No acute intracranial findings are seen.  Atrophy. Chronic sinusitis. Electronically Signed   By: Elmer Picker M.D.   On: 09/16/2021 17:14    Procedures .Critical Care Performed by: Lucrezia Starch, MD Authorized by: Lucrezia Starch, MD   Critical care provider statement:    Critical care time (minutes):  38   Critical care was necessary to treat or prevent imminent or life-threatening deterioration of the following conditions:  CNS failure or compromise   Critical care was time spent personally by me on the following activities:  Development of treatment plan with patient or surrogate, discussions with consultants, evaluation of patient's response to treatment, examination of patient, ordering and review of laboratory studies, ordering and review of radiographic studies, ordering and performing treatments and interventions, pulse oximetry, re-evaluation of patient's condition and review of old charts    Medications Ordered in ED Medications  acetaminophen (TYLENOL) tablet 1,000 mg (1,000 mg Oral  Given 09/16/21 1827)    ED Course/ Medical Decision Making/ A&P                           Medical Decision Making Amount and/or Complexity of Data Reviewed Labs: ordered. Radiology: ordered.  Risk OTC drugs. Decision regarding hospitalization.   71 year old lady presenting to ER with concern for chest pain as well as right facial numbness and right arm numbness.  Has prior history of stroke, myasthenia gravis among other medical issues.  Additional history was obtained from family member at bedside as well as extensive chart review, review of past hospitalist discharge summaries, cardiology outpatient notes, neurology [facility] notes.  On exam patient is well-appearing in no acute distress, mild hypertension noted but otherwise stable vital signs.  On neurologic exam, noted decreased sensation to right face, right arm but no frank difference in weakness (reports some mild residual weakness and limp with walking from her old stroke), also noted left homonymous hemianopsia.  Patient outside of window for any sort of consideration for tPA. Doubt LVO based on exam, deferred stroke alert. Patient denied any visual complaints, unclear if this is acute or chronic.  Concern principally for recurrent stroke.  EKG is without ischemic change and troponin is within normal limits, lower suspicion for ACS at present.  Did endorse pain at times seem to radiate back however pain is not described as severe or stabbing/ripping, pulses are equal in all 4 extremities, no widened mediastinum on cxr, have low suspicion for dissection or other acute aortic pathology at present. Has severe contrast allergy causing anaphylaxis; given this contraindication to this modality and low clinical suspicion will defer further testing. Regarding stroke work up, CT head was negative.  Again unable to obtain CTA head and neck due to her severe contrast allergy but  she reports.  Discussed with Dr. Cheral Marker, on-call for neurology.  He recommends  admitting to the hospitalist service for stroke work-up.  He will notify [facility] team. Discussed with Posey Pronto with [facility] who will accept to [facility].         Final Clinical Impression(s) / ED Diagnoses Final diagnoses:  Stroke-like symptoms  Chest pain, unspecified type    Rx / DC Orders ED Discharge Orders     None         Lucrezia Starch, MD 09/16/21 1919

## 2021-09-16 NOTE — Assessment & Plan Note (Addendum)
History of left thalamic infarct with mild right-sided residual weakness Presenting with diminished sensation right face and extremities or stroke work-up.  Seen by neurology, suspicion is for complicated migraine versus psychogenic presentation. -Follow MRI brain without contrast -Continue aspirin 81 mg daily alone for now -Keep on telemetry, continue neurochecks -Check A1c, lipid panel -PT/OT/SLP eval

## 2021-09-16 NOTE — ED Notes (Signed)
Patient transported to X-ray 

## 2021-09-17 ENCOUNTER — Observation Stay (HOSPITAL_COMMUNITY): Payer: Medicare Other

## 2021-09-17 ENCOUNTER — Observation Stay (HOSPITAL_BASED_OUTPATIENT_CLINIC_OR_DEPARTMENT_OTHER): Payer: Medicare Other

## 2021-09-17 DIAGNOSIS — E1159 Type 2 diabetes mellitus with other circulatory complications: Secondary | ICD-10-CM

## 2021-09-17 DIAGNOSIS — Z794 Long term (current) use of insulin: Secondary | ICD-10-CM | POA: Diagnosis not present

## 2021-09-17 DIAGNOSIS — I152 Hypertension secondary to endocrine disorders: Secondary | ICD-10-CM

## 2021-09-17 DIAGNOSIS — I10 Essential (primary) hypertension: Secondary | ICD-10-CM | POA: Diagnosis not present

## 2021-09-17 DIAGNOSIS — E1169 Type 2 diabetes mellitus with other specified complication: Secondary | ICD-10-CM | POA: Diagnosis not present

## 2021-09-17 DIAGNOSIS — I639 Cerebral infarction, unspecified: Secondary | ICD-10-CM | POA: Diagnosis not present

## 2021-09-17 DIAGNOSIS — R079 Chest pain, unspecified: Secondary | ICD-10-CM

## 2021-09-17 DIAGNOSIS — E876 Hypokalemia: Secondary | ICD-10-CM | POA: Diagnosis present

## 2021-09-17 DIAGNOSIS — G459 Transient cerebral ischemic attack, unspecified: Secondary | ICD-10-CM | POA: Diagnosis not present

## 2021-09-17 DIAGNOSIS — R200 Anesthesia of skin: Secondary | ICD-10-CM | POA: Diagnosis not present

## 2021-09-17 DIAGNOSIS — E785 Hyperlipidemia, unspecified: Secondary | ICD-10-CM

## 2021-09-17 DIAGNOSIS — E119 Type 2 diabetes mellitus without complications: Secondary | ICD-10-CM | POA: Diagnosis not present

## 2021-09-17 DIAGNOSIS — G4733 Obstructive sleep apnea (adult) (pediatric): Secondary | ICD-10-CM

## 2021-09-17 DIAGNOSIS — R2990 Unspecified symptoms and signs involving the nervous system: Secondary | ICD-10-CM | POA: Diagnosis not present

## 2021-09-17 DIAGNOSIS — G7000 Myasthenia gravis without (acute) exacerbation: Secondary | ICD-10-CM | POA: Diagnosis not present

## 2021-09-17 DIAGNOSIS — I6523 Occlusion and stenosis of bilateral carotid arteries: Secondary | ICD-10-CM | POA: Diagnosis not present

## 2021-09-17 LAB — ECHOCARDIOGRAM COMPLETE
AR max vel: 2.75 cm2
AV Area VTI: 2.6 cm2
AV Area mean vel: 2.69 cm2
AV Mean grad: 6 mmHg
AV Peak grad: 11.2 mmHg
Ao pk vel: 1.67 m/s
Area-P 1/2: 3.37 cm2
Height: 68 in
MV VTI: 2.46 cm2
S' Lateral: 1.8 cm
Weight: 2928 oz

## 2021-09-17 LAB — CBC
HCT: 32.9 % — ABNORMAL LOW (ref 36.0–46.0)
Hemoglobin: 10.8 g/dL — ABNORMAL LOW (ref 12.0–15.0)
MCH: 30.4 pg (ref 26.0–34.0)
MCHC: 32.8 g/dL (ref 30.0–36.0)
MCV: 92.7 fL (ref 80.0–100.0)
Platelets: 192 10*3/uL (ref 150–400)
RBC: 3.55 MIL/uL — ABNORMAL LOW (ref 3.87–5.11)
RDW: 13.5 % (ref 11.5–15.5)
WBC: 4.2 10*3/uL (ref 4.0–10.5)
nRBC: 0 % (ref 0.0–0.2)

## 2021-09-17 LAB — BASIC METABOLIC PANEL
Anion gap: 8 (ref 5–15)
BUN: 9 mg/dL (ref 8–23)
CO2: 25 mmol/L (ref 22–32)
Calcium: 8.8 mg/dL — ABNORMAL LOW (ref 8.9–10.3)
Chloride: 102 mmol/L (ref 98–111)
Creatinine, Ser: 1.06 mg/dL — ABNORMAL HIGH (ref 0.44–1.00)
GFR, Estimated: 57 mL/min — ABNORMAL LOW (ref >60)
Glucose, Bld: 217 mg/dL — ABNORMAL HIGH (ref 70–99)
Potassium: 2.7 mmol/L — CL (ref 3.5–5.1)
Sodium: 135 mmol/L (ref 135–145)

## 2021-09-17 LAB — GLUCOSE, CAPILLARY
Glucose-Capillary: 252 mg/dL — ABNORMAL HIGH (ref 70–99)
Glucose-Capillary: 270 mg/dL — ABNORMAL HIGH (ref 70–99)
Glucose-Capillary: 270 mg/dL — ABNORMAL HIGH (ref 70–99)
Glucose-Capillary: 395 mg/dL — ABNORMAL HIGH (ref 70–99)
Glucose-Capillary: 430 mg/dL — ABNORMAL HIGH (ref 70–99)
Glucose-Capillary: 466 mg/dL — ABNORMAL HIGH (ref 70–99)

## 2021-09-17 LAB — POTASSIUM: Potassium: 3 mmol/L — ABNORMAL LOW (ref 3.5–5.1)

## 2021-09-17 LAB — MAGNESIUM: Magnesium: 1.4 mg/dL — ABNORMAL LOW (ref 1.7–2.4)

## 2021-09-17 LAB — LIPID PANEL
Cholesterol: 197 mg/dL (ref 0–200)
HDL: 47 mg/dL (ref >40)
LDL Cholesterol: 120 mg/dL — ABNORMAL HIGH (ref 0–99)
Total CHOL/HDL Ratio: 4.2 RATIO
Triglycerides: 149 mg/dL (ref <150)
VLDL: 30 mg/dL (ref 0–40)

## 2021-09-17 LAB — HIV ANTIBODY (ROUTINE TESTING W REFLEX): HIV Screen 4th Generation wRfx: NONREACTIVE

## 2021-09-17 LAB — HEMOGLOBIN A1C
Hgb A1c MFr Bld: 9.7 % — ABNORMAL HIGH (ref 4.8–5.6)
Mean Plasma Glucose: 232 mg/dL

## 2021-09-17 MED ORDER — MONTELUKAST SODIUM 10 MG PO TABS
10.0000 mg | ORAL_TABLET | ORAL | Status: DC
  Administered 2021-09-17 – 2021-09-27 (×11): 10 mg via ORAL
  Filled 2021-09-17 (×11): qty 1

## 2021-09-17 MED ORDER — ASPIRIN EC 325 MG PO TBEC
325.0000 mg | DELAYED_RELEASE_TABLET | Freq: Every day | ORAL | Status: DC
  Administered 2021-09-18 – 2021-09-27 (×10): 325 mg via ORAL
  Filled 2021-09-17 (×10): qty 1

## 2021-09-17 MED ORDER — ACETAMINOPHEN 325 MG PO TABS
650.0000 mg | ORAL_TABLET | ORAL | Status: DC | PRN
  Administered 2021-09-18 – 2021-09-26 (×9): 650 mg via ORAL
  Filled 2021-09-17 (×10): qty 2

## 2021-09-17 MED ORDER — ENOXAPARIN SODIUM 40 MG/0.4ML IJ SOSY
40.0000 mg | PREFILLED_SYRINGE | INTRAMUSCULAR | Status: DC
  Administered 2021-09-17 – 2021-09-21 (×4): 40 mg via SUBCUTANEOUS
  Filled 2021-09-17 (×4): qty 0.4

## 2021-09-17 MED ORDER — LEVOTHYROXINE SODIUM 75 MCG PO TABS
150.0000 ug | ORAL_TABLET | Freq: Every day | ORAL | Status: DC
  Administered 2021-09-17 – 2021-09-27 (×11): 150 ug via ORAL
  Filled 2021-09-17 (×11): qty 2

## 2021-09-17 MED ORDER — PREDNISONE 10 MG PO TABS
10.0000 mg | ORAL_TABLET | ORAL | Status: DC
  Administered 2021-09-17 – 2021-09-27 (×11): 10 mg via ORAL
  Filled 2021-09-17 (×2): qty 2
  Filled 2021-09-17: qty 1
  Filled 2021-09-17 (×3): qty 2
  Filled 2021-09-17: qty 1
  Filled 2021-09-17 (×3): qty 2
  Filled 2021-09-17: qty 1

## 2021-09-17 MED ORDER — DIPHENHYDRAMINE HCL 50 MG/ML IJ SOLN: 50.0000 mg | Freq: Once | INTRAMUSCULAR | Status: AC

## 2021-09-17 MED ORDER — STROKE: EARLY STAGES OF RECOVERY BOOK
Freq: Once | Status: AC
  Filled 2021-09-17: qty 1

## 2021-09-17 MED ORDER — INSULIN ASPART 100 UNIT/ML IJ SOLN
0.0000 [IU] | Freq: Three times a day (TID) | INTRAMUSCULAR | Status: DC
  Administered 2021-09-17: 16:00:00 15 [IU] via SUBCUTANEOUS
  Administered 2021-09-17 (×2): 8 [IU] via SUBCUTANEOUS
  Administered 2021-09-18 (×3): 15 [IU] via SUBCUTANEOUS
  Administered 2021-09-19 (×2): 5 [IU] via SUBCUTANEOUS
  Administered 2021-09-19: 18:00:00 11 [IU] via SUBCUTANEOUS
  Administered 2021-09-20 (×2): 8 [IU] via SUBCUTANEOUS
  Administered 2021-09-21 – 2021-09-22 (×2): 3 [IU] via SUBCUTANEOUS
  Administered 2021-09-22 (×2): 15 [IU] via SUBCUTANEOUS
  Administered 2021-09-23 (×2): 8 [IU] via SUBCUTANEOUS
  Administered 2021-09-23: 07:00:00 3 [IU] via SUBCUTANEOUS
  Administered 2021-09-24: 8 [IU] via SUBCUTANEOUS
  Administered 2021-09-24: 3 [IU] via SUBCUTANEOUS
  Administered 2021-09-25: 5 [IU] via SUBCUTANEOUS
  Administered 2021-09-25 (×2): 3 [IU] via SUBCUTANEOUS
  Administered 2021-09-26 (×2): 5 [IU] via SUBCUTANEOUS
  Administered 2021-09-27: 8 [IU] via SUBCUTANEOUS

## 2021-09-17 MED ORDER — ROSUVASTATIN CALCIUM 20 MG PO TABS
40.0000 mg | ORAL_TABLET | Freq: Every day | ORAL | Status: DC
  Administered 2021-09-17 – 2021-09-27 (×11): 40 mg via ORAL
  Filled 2021-09-17 (×11): qty 2

## 2021-09-17 MED ORDER — INSULIN ASPART 100 UNIT/ML IJ SOLN
0.0000 [IU] | Freq: Every day | INTRAMUSCULAR | Status: DC
  Administered 2021-09-17: 22:00:00 5 [IU] via SUBCUTANEOUS
  Administered 2021-09-17: 01:00:00 3 [IU] via SUBCUTANEOUS
  Administered 2021-09-18: 22:00:00 5 [IU] via SUBCUTANEOUS
  Administered 2021-09-24: 2 [IU] via SUBCUTANEOUS
  Administered 2021-09-25: 3 [IU] via SUBCUTANEOUS

## 2021-09-17 MED ORDER — DIPHENHYDRAMINE HCL 25 MG PO CAPS: 50.0000 mg | ORAL_CAPSULE | Freq: Once | ORAL | Status: DC

## 2021-09-17 MED ORDER — METOCLOPRAMIDE HCL 5 MG/ML IJ SOLN
10.0000 mg | Freq: Once | INTRAMUSCULAR | Status: AC
  Administered 2021-09-17: 04:00:00 10 mg via INTRAVENOUS
  Filled 2021-09-17: qty 2

## 2021-09-17 MED ORDER — CYCLOSPORINE 0.05 % OP EMUL
1.0000 [drp] | Freq: Two times a day (BID) | OPHTHALMIC | Status: DC
  Administered 2021-09-17 – 2021-09-27 (×17): 1 [drp] via OPHTHALMIC
  Filled 2021-09-17 (×23): qty 30

## 2021-09-17 MED ORDER — EZETIMIBE 10 MG PO TABS
10.0000 mg | ORAL_TABLET | Freq: Every day | ORAL | Status: DC
  Administered 2021-09-17 – 2021-09-27 (×11): 10 mg via ORAL
  Filled 2021-09-17 (×11): qty 1

## 2021-09-17 MED ORDER — PREDNISONE 50 MG PO TABS
50.0000 mg | ORAL_TABLET | Freq: Four times a day (QID) | ORAL | Status: AC
  Administered 2021-09-17 – 2021-09-18 (×3): 50 mg via ORAL
  Filled 2021-09-17 (×3): qty 1

## 2021-09-17 MED ORDER — PREDNISONE 50 MG PO TABS: 50.0000 mg | ORAL_TABLET | Freq: Four times a day (QID) | ORAL | Status: DC

## 2021-09-17 MED ORDER — POTASSIUM CHLORIDE CRYS ER 20 MEQ PO TBCR
40.0000 meq | EXTENDED_RELEASE_TABLET | Freq: Once | ORAL | Status: AC
  Administered 2021-09-17: 09:00:00 40 meq via ORAL
  Filled 2021-09-17: qty 2

## 2021-09-17 MED ORDER — ALBUTEROL SULFATE (2.5 MG/3ML) 0.083% IN NEBU: 3.0000 mL | INHALATION_SOLUTION | Freq: Four times a day (QID) | RESPIRATORY_TRACT | Status: DC | PRN

## 2021-09-17 MED ORDER — INSULIN GLARGINE-YFGN 100 UNIT/ML ~~LOC~~ SOLN
15.0000 [IU] | Freq: Every day | SUBCUTANEOUS | Status: DC
  Administered 2021-09-17: 18:00:00 15 [IU] via SUBCUTANEOUS
  Filled 2021-09-17 (×2): qty 0.15

## 2021-09-17 MED ORDER — CLOPIDOGREL BISULFATE 75 MG PO TABS
75.0000 mg | ORAL_TABLET | Freq: Every day | ORAL | Status: DC
  Administered 2021-09-17 – 2021-09-27 (×11): 75 mg via ORAL
  Filled 2021-09-17 (×11): qty 1

## 2021-09-17 MED ORDER — ASPIRIN EC 81 MG PO TBEC
81.0000 mg | DELAYED_RELEASE_TABLET | Freq: Every day | ORAL | Status: DC
  Administered 2021-09-17: 01:00:00 81 mg via ORAL
  Filled 2021-09-17 (×2): qty 1

## 2021-09-17 MED ORDER — MAGNESIUM SULFATE 2 GM/50ML IV SOLN
2.0000 g | Freq: Once | INTRAVENOUS | Status: AC
  Administered 2021-09-17: 10:00:00 2 g via INTRAVENOUS
  Filled 2021-09-17: qty 50

## 2021-09-17 MED ORDER — DIPHENHYDRAMINE HCL 25 MG PO CAPS
50.0000 mg | ORAL_CAPSULE | Freq: Once | ORAL | Status: AC
  Administered 2021-09-18: 50 mg via ORAL
  Filled 2021-09-17: qty 2

## 2021-09-17 MED ORDER — TOPIRAMATE 25 MG PO TABS
50.0000 mg | ORAL_TABLET | Freq: Every day | ORAL | Status: DC
  Administered 2021-09-17 – 2021-09-26 (×11): 50 mg via ORAL
  Filled 2021-09-17 (×11): qty 2

## 2021-09-17 MED ORDER — POTASSIUM CHLORIDE 20 MEQ PO PACK
60.0000 meq | PACK | Freq: Once | ORAL | Status: AC
Start: 2021-09-17 — End: 2021-09-17
  Administered 2021-09-17: 04:00:00 60 meq via ORAL
  Filled 2021-09-17: qty 3

## 2021-09-17 MED ORDER — ACETAMINOPHEN 650 MG RE SUPP: 650.0000 mg | RECTAL | Status: DC | PRN

## 2021-09-17 MED ORDER — DIPHENHYDRAMINE HCL 50 MG/ML IJ SOLN: 50.0000 mg | Freq: Once | INTRAMUSCULAR | Status: DC

## 2021-09-17 MED ORDER — ACETAMINOPHEN 160 MG/5ML PO SOLN: 650.0000 mg | ORAL | Status: DC | PRN

## 2021-09-17 MED ORDER — ONDANSETRON HCL 4 MG/2ML IJ SOLN
4.0000 mg | Freq: Four times a day (QID) | INTRAMUSCULAR | Status: DC | PRN
  Administered 2021-09-24 – 2021-09-25 (×2): 4 mg via INTRAVENOUS
  Filled 2021-09-17 (×2): qty 2

## 2021-09-17 MED ORDER — SENNOSIDES-DOCUSATE SODIUM 8.6-50 MG PO TABS
1.0000 | ORAL_TABLET | Freq: Every evening | ORAL | Status: DC | PRN
  Administered 2021-09-18: 1 via ORAL
  Filled 2021-09-17: qty 1

## 2021-09-17 NOTE — Assessment & Plan Note (Signed)
Continue rosuvastatin and Zetia. 

## 2021-09-17 NOTE — Assessment & Plan Note (Addendum)
Wearing insulin pump however patient states insulin ran out while in the ED.  Placed on moderate SSI in the meantime.  Hold Jardiance.

## 2021-09-17 NOTE — Assessment & Plan Note (Signed)
Slow orally, check magnesium.  Hold HCTZ.

## 2021-09-17 NOTE — Evaluation (Signed)
Physical Therapy Evaluation Patient Details Name: [name] MRN: [id number] DOB: 27-Aug-1950 Today's Date: 09/17/2021  History of Present Illness  Pt is a 71 y.o. female who presented with chest pain, right facial numbness and arm numbness 09/16/21, MRI/MRA negative for acute intracranial changes with evidence of flow reducing stenosis at the right more than left proximal ICA. PMH significant for seronegative myasthenia gravis, hypertension, asymptomatic carotid artery stenosis, Prior stroke in April 2021, Paraneoplastic retinopathy.   Clinical Impression  Pt admitted with above diagnosis. Pt currently with functional limitations due to the deficits listed below (see PT Problem List). At the time of PT eval pt was able to perform transfers and ambulation with gross min guard assist to supervision for safety. Pt lives alone with occasional family assist at baseline. Feel with increased frequency of multidisciplinary rehab at d/c, pt has the potential to achieve a mod I level of function to return home alone with intermittent family support. Acutely, pt will benefit from skilled PT to increase their independence and safety with mobility to allow discharge to the venue listed below.          Recommendations for follow up therapy are one component of a multi-disciplinary discharge planning process, led by the attending physician.  Recommendations may be updated based on patient status, additional functional criteria and insurance authorization.  Follow Up Recommendations Acute inpatient rehab (3hours/day)    Assistance Recommended at Discharge Intermittent Supervision/Assistance  Patient can return home with the following  A little help with walking and/or transfers;Assist for transportation;Help with stairs or ramp for entrance;Assistance with cooking/housework    Equipment Recommendations None recommended by PT  Recommendations for Other Services  Rehab consult    Functional Status  Assessment Patient has had a recent decline in their functional status and demonstrates the ability to make significant improvements in function in a reasonable and predictable amount of time.     Precautions / Restrictions Precautions Precautions: Fall Precaution Comments: Double vision Restrictions Weight Bearing Restrictions: No      Mobility  Bed Mobility Overal bed mobility: Modified Independent             General bed mobility comments: with HOB elevated. No assist required and min use of rails .    Transfers Overall transfer level: Needs assistance Equipment used: Rolling walker (2 wheels) Transfers: Sit to/from Stand Sit to Stand: Min guard, Supervision           General transfer comment: Min guard initially and appeared slightly unsteady upon first stand, progressing to supervision from low toilet by end of session.    Ambulation/Gait Ambulation/Gait assistance: Min guard Gait Distance (Feet): 25 Feet Assistive device: Rolling walker (2 wheels) Gait Pattern/deviations: Step-through pattern, Decreased stride length, Trunk flexed Gait velocity: Decreased Gait velocity interpretation: <1.31 ft/sec, indicative of household ambulator   General Gait Details: Close guard for safety as pt powered ambulated around room (to/from bathroom). Therapist managed lines and pt required min cues for optimal walker placement during functional activity.  Stairs            Wheelchair Mobility    Modified Rankin (Stroke Patients Only) Modified Rankin (Stroke Patients Only) Pre-Morbid Rankin Score: Slight disability Modified Rankin: Moderately severe disability     Balance Overall balance assessment: Needs assistance Sitting-balance support: Feet supported Sitting balance-Leahy Scale: Good     Standing balance support: No upper extremity supported, During functional activity Standing balance-Leahy Scale: Fair  Pertinent Vitals/Pain Pain Assessment Pain Assessment: Faces Faces Pain Scale: Hurts a little bit Pain Location: generalized Pain Descriptors / Indicators: Grimacing Pain Intervention(s): Limited activity within patient's tolerance, Monitored during session, Repositioned    Home Living Family/patient expects to be discharged to:: Private residence Living Arrangements: Alone Available Help at Discharge: Family;Available PRN/intermittently Type of Home: House Home Access: Stairs to enter   [employer] of Steps: 2 without rail; front door: 5 without rail   Home Layout: One level Home Equipment: Conservation officer, nature (2 wheels);Tub bench Additional Comments: Pt has 5 siblings - 3 live in town and assist intermittently; 2 live out of town.    Prior Function Prior Level of Function : Independent/Modified Independent;Needs assist       Physical Assist : ADLs (physical)   ADLs (physical): Bathing;IADLs Mobility Comments: uses RW all the time ADLs Comments: tub bench for bathing, family assists with med mgmt & bills as needed     Hand Dominance   Dominant Hand: Right    Extremity/Trunk Assessment   Upper Extremity Assessment Upper Extremity Assessment: RUE deficits/detail RUE Deficits / Details: poor grip strength, generally 3/5 to 4-/5 strength in biceps, triceps, shoulder flexion. RUE Sensation: decreased light touch RUE Coordination: decreased fine motor;decreased gross motor    Lower Extremity Assessment Lower Extremity Assessment: RLE deficits/detail RLE Deficits / Details: Grossly 4/5 strength in quads, hip flexors, and ankle DF; 4-/5 strength in hamstrings. Pt reports decreased sensation in foot up to knee. LLE Sensation: decreased light touch LLE Coordination: decreased gross motor    Cervical / Trunk Assessment Cervical / Trunk Assessment: Normal  Communication   Communication: No difficulties  Cognition Arousal/Alertness: Awake/alert Behavior  During Therapy: Flat affect Overall Cognitive Status: Within Functional Limits for tasks assessed                                 General Comments: pt is Ox4 and cog is overall WFL for functional tasks assessed. Processing seems slow at times, however pt reports feeling groggy 2 medications.        General Comments      Exercises     Assessment/Plan    PT Assessment Patient needs continued PT services  PT Problem List Decreased strength;Decreased range of motion;Decreased activity tolerance;Decreased balance;Decreased mobility;Decreased knowledge of use of DME;Decreased coordination;Decreased cognition;Decreased safety awareness;Decreased knowledge of precautions;Impaired sensation       PT Treatment Interventions DME instruction;Gait training;Stair training;Functional mobility training;Therapeutic activities;Therapeutic exercise;Neuromuscular re-education;Patient/family education    PT Goals (Current goals can be found in the Care Plan section)  Acute Rehab PT Goals Patient Stated Goal: Back to independence at home PT Goal Formulation: With patient Time For Goal Achievement: 10/01/21 Potential to Achieve Goals: Good    Frequency Min 3X/week     Co-evaluation               AM-PAC PT "6 Clicks" Mobility  Outcome Measure Help needed turning from your back to your side while in a flat bed without using bedrails?: None Help needed moving from lying on your back to sitting on the side of a flat bed without using bedrails?: None Help needed moving to and from a bed to a chair (including a wheelchair)?: A Little Help needed standing up from a chair using your arms (e.g., wheelchair or bedside chair)?: A Little Help needed to walk in hospital room?: A Little Help needed climbing 3-5 steps with a railing? :  A Little 6 Click Score: 20    End of Session Equipment Utilized During Treatment: Gait belt Activity Tolerance: Patient tolerated treatment  well Patient left: in bed;with call bell/phone within reach;with bed alarm set Nurse Communication: Mobility status PT Visit Diagnosis: Unsteadiness on feet (R26.81);Difficulty in walking, not elsewhere classified (R26.2)    Time: 7681-1572 PT Time Calculation (min) (ACUTE ONLY): 28 min   Charges:   PT Evaluation $PT Eval Moderate Complexity: 1 Mod PT Treatments $Gait Training: 8-22 mins        Rolinda Roan, PT, DPT Acute Rehabilitation Services Pager: [phone] Office: [phone]   Thelma Comp 09/17/2021, 3:48 PM

## 2021-09-17 NOTE — Progress Notes (Signed)
PROGRESS NOTE  [name]  DOB: 09-22-1950  PCP: [name], DO ION:[id number]  DOA: 09/16/2021  LOS: 0 days  Hospital Day: 2  Chief Complaint  Patient presents with   Chest Pain   Brief narrative: RUMAISA SCHNETZER is a 71 y.o. 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  She felt similar to when she had previous stroke and hence came to the ED. Of note, patient recently had diarrhea.  Her neurologist at [facility] advised her to hold CellCept for 10 days after which diarrhea resolved.  In the ED, her blood pressure was elevated to 173/70 CT head was negative. Serum glucose was elevated to 239. SARS-CoV-2 and influenza PCR negative. Chest x-ray negative for focal consolidation, edema, effusion. CT head without contrast negative for acute intracranial findings. Admitted to hospitalist service Neurology consulted.  Subjective: Patient was seen and examined this morning.  Pleasant elderly African-American female.  Lying on bed.  Not in distress.  Assessment/Plan: Right facial numbness History of left thalamic infarct with mild right-sided residual weakness -Admitted for stroke work-up.   -Neurology consult appreciated.  Also need to consider complex migraine versus psychogenic presentation.   -CT head and MRI brain unremarkable. -intracranial MRA did not show any emergent findings. -Neck MRA showed evidence of flow reducing stenosis of the right more than left proximal ICA.  Recommended CTA.  If significant, may need vascular surgery involvement. -Pending CTA head and neck -A1c 9.1, LDL 120 -Prior to admission,  patient was not aspirin 81 mg daily, Zetia 10 mg daily, Crestor 40 mg daily.  Currently continue the same -Pending PT/OT/ST eval   Myasthenia gravis -Follows with [place] neurology.   -CellCept recently held due to diarrhea which has since resolved.   -Continue chronic prednisone 10 mg daily.  -She receives Rituxan and IVIG infusions.  Weekly through [place].  Uncontrolled type 2 diabetes mellitus with hyperglycemia -A1c 9.1 in 2021.  Repeat A1c in process -Home meds include insulin pump and Jardiance.  However patient states he ran out of it while in the ED. -Currently on sliding scale insulin with Accu-Cheks.  Blood sugar level this afternoon as high as 395.  I will add Semglee 15 units at this time. Lab Results  Component Value Date   HGBA1C 9.1 (H) 12/20/2019   Recent Labs  Lab 09/17/21 0031 09/17/21 0719 09/17/21 1113 09/17/21 1542  GLUCAP 270* 270* 252* 395*   Essential hypertension -Permissive hypertension allowed for now -Home meds include olmesartan 20 mg daily, Aldactone 25 1 daily, HCTZ 25 mg daily -Currently on hold for permissive hypertension. -Continue monitor blood pressure.   Hypothyroidism Continue Synthroid.   Hyperlipidemia Continue rosuvastatin and Zetia.   OSA Continue CPAP nightly.   Hypokalemia/hypomagnesemia -Oral and IV replacement ordered. -Continue to monitor. Recent Labs  Lab 09/16/21 1528 09/17/21 0404 09/17/21 0725  K 3.0* 2.7* 3.0*  MG  --  1.4*  --    Mobility: Encourage ambulation Living condition: Lives at home Goals of care:   Code Status: Full Code  Nutritional status: Body mass index is 27.83 kg/m.      Diet:  Diet Order  Diet heart healthy/carb modified Room service appropriate? Yes; Fluid consistency: Thin  Diet effective now                  DVT prophylaxis:  enoxaparin (LOVENOX) injection 40 mg Start: 09/17/21 0800   Antimicrobials: None Fluid: None Consultants: Neurology Family Communication:  None at bedside  Status is: Observation  Continue in-hospital care because: Further stroke work-up Level of care: Telemetry Medical   Dispo: The patient is from: Home              Anticipated d/c is to: Home likely              Patient currently is not medically stable to d/c.   Difficult to place patient No     Infusions:    Scheduled Meds:  [START ON 09/18/2021] aspirin EC  325 mg Oral Daily   clopidogrel  75 mg Oral Daily   cycloSPORINE  1 drop Both Eyes BID   [START ON 09/18/2021] diphenhydrAMINE  50 mg Oral Once   Or   [START ON 09/18/2021] diphenhydrAMINE  50 mg Intravenous Once   enoxaparin (LOVENOX) injection  40 mg Subcutaneous Q24H   ezetimibe  10 mg Oral Daily   insulin aspart  0-15 Units Subcutaneous TID WC   insulin aspart  0-5 Units Subcutaneous QHS   insulin glargine-yfgn  15 Units Subcutaneous Daily   levothyroxine  150 mcg Oral QAC breakfast   montelukast  10 mg Oral BH-q7a   predniSONE  10 mg Oral BH-q7a   predniSONE  50 mg Oral Q6H   rosuvastatin  40 mg Oral Daily   topiramate  50 mg Oral QHS    PRN meds: acetaminophen **OR** acetaminophen (TYLENOL) oral liquid 160 mg/5 mL **OR** acetaminophen, albuterol, ondansetron (ZOFRAN) IV, senna-docusate   Antimicrobials: Anti-infectives (From admission, onward)    None       Objective: Vitals:   09/17/21 1109 09/17/21 1605  BP: (!) 131/54 (!) 148/72  Pulse: 76 89  Resp: 16 20  Temp: 98.6 F (37 C) 98.2 F (36.8 C)  SpO2: 96% 96%    Intake/Output Summary (Last 24 hours) at 09/17/2021 1640 Last data filed at 09/17/2021 1500 Gross per 24 hour  Intake 50 ml  Output --  Net 50 ml   Filed Weights   09/16/21 1525  Weight: 83 kg   Weight change:  Body mass index is 27.83 kg/m.   Physical Exam: General exam: Pleasant elderly African-American female.  Not in physical distress Skin: No rashes, lesions or ulcers. HEENT: Atraumatic, normocephalic, no obvious bleeding Lungs: Clear to auscultation  bilaterally CVS: Regular rate and rhythm, no murmur GI/Abd soft, nontender, nondistended, bowel sound present CNS: Alert, awake, oriented x3 Psychiatry: Mood appropriate Extremities: No pedal edema, no calf tenderness  Data Review: I have personally reviewed the laboratory data and studies available.  F/u labs ordered Unresulted Labs (From admission, onward)     Start     Ordered   09/18/21 0500  CBC with Differential/Platelet  Tomorrow morning,   R        09/17/21 1640   09/18/21 1194  Basic metabolic panel  Tomorrow morning,   R        09/17/21 1640   09/17/21 0500  Hemoglobin A1c  (Labs)  Tomorrow morning,   R        09/17/21 0017            Signed, Terrilee Croak, MD [facility] 09/17/2021

## 2021-09-17 NOTE — Evaluation (Signed)
Occupational Therapy Evaluation Patient Details Name: [name] MRN: [id number] DOB: 1951/04/27 Today's Date: 09/17/2021   History of Present Illness [name] is a 71 y.o. female who presented with chest pain, right facial numbness and arm numbness 1/24, work up so far is negative f roacute findings with evidence of flow reducing stenosis at the right more than left proximal ICA.  PMHx: seronegative myasthenia gravis, hypertension, asymptomatic carotid artery stenosis, Prior stroke in April 2021.  Paraneoplastic retinopathy.   Clinical Impression   [name] reports being generally mod I PTA, she uses a RW for all mobility, a tub bench for bathing, does not drive and has family near-by who can assist with IADLs as needed. She lives alone in a 1 level home with 2-5 STE. Upon evaluation pt demonstrated mod I bed motility and min guard ambulation with RW. She has a slow shuffling gait, which she stated is new for her. She was able to use her RUE functionally for toileting and grooming at the sink when given min A for packages despite R weakness and paresthesias. She also reports constant DV that does not disappear with one eye closed; she did have difficulty with following commands for vision assessment. Pt will benefit from OT acutely. Due to pt's new onset of symptoms, recommend d/c to AIR to progress pt back to her mod I baseline prior to d/c home.      Recommendations for follow up therapy are one component of a multi-disciplinary discharge planning process, led by the attending physician.  Recommendations may be updated based on patient status, additional functional criteria and insurance authorization.   Follow Up Recommendations  Acute inpatient rehab (3hours/day)    Assistance Recommended at Discharge Intermittent Supervision/Assistance  Patient can return home with the following A little help with walking and/or transfers;A little help with bathing/dressing/bathroom;Assist for  transportation;Help with stairs or ramp for entrance;Direct supervision/assist for medications management    Functional Status Assessment  Patient has had a recent decline in their functional status and demonstrates the ability to make significant improvements in function in a reasonable and predictable amount of time.  Equipment Recommendations  None recommended by OT       Precautions / Restrictions Precautions Precautions: Fall Precaution Comments: DV Restrictions Weight Bearing Restrictions: No      Mobility Bed Mobility Overal bed mobility: Modified Independent             General bed mobility comments: with HOB elevated    Transfers Overall transfer level: Needs assistance Equipment used: Rolling walker (2 wheels) Transfers: Sit to/from Stand Sit to Stand: Min guard           General transfer comment: mildly unsteady upon standing, once ambulating pt transitioned to supervision      Balance Overall balance assessment: Needs assistance Sitting-balance support: Feet supported Sitting balance-Leahy Scale: Good     Standing balance support: No upper extremity supported, During functional activity Standing balance-Leahy Scale: Fair Standing balance comment: groomed at the sink without UE support                           ADL either performed or assessed with clinical judgement   ADL Overall ADL's : Needs assistance/impaired Eating/Feeding: Set up;Sitting Eating/Feeding Details (indicate cue type and reason): Assist with packaging Grooming: Supervision/safety;Set up;Standing Grooming Details (indicate cue type and reason): assist with packaging, standing at the sink Upper Body Bathing: Set up;Sitting   Lower Body Bathing:  Minimal assistance;Sit to/from stand   Upper Body Dressing : Set up;Sitting   Lower Body Dressing: Minimal assistance;Sit to/from stand   Toilet Transfer: Ambulation;Regular Toilet;Grab bars;Min Psychiatric nurse  Details (indicate cue type and reason): 1 vc for use of grab bar, pt with slow shuffling gait         Functional mobility during ADLs: Min guard;Rolling walker (2 wheels) General ADL Comments: Pt limited by RUE parasthesia, weakness and impaired coordintion as well as DV.     Vision Baseline Vision/History: 1 Wears glasses Ability to See in Adequate Light: 1 Impaired Patient Visual Report: Diplopia;Blurring of vision Vision Assessment?: Vision impaired- to be further tested in functional context;Yes Eye Alignment: Within Functional Limits Ocular Range of Motion: Impaired-to be further tested in functional context Alignment/Gaze Preference: Within Defined Limits Tracking/Visual Pursuits: Impaired - to be further tested in functional context;Requires cues, head turns, or add eye shifts to track Saccades: Within functional limits Convergence: Impaired - to be further tested in functional context Visual Fields: Impaired-to be further tested in functional context Diplopia Assessment: Objects split side to side;Present all the time/all directions Additional Comments: Pt reports DV that does not disappear with on eeye closed. present all of the tiime. Pt had difficulty with following direction for vision assessment            Pertinent Vitals/Pain Pain Assessment Pain Assessment: Faces Faces Pain Scale: Hurts a little bit Pain Location: generalized Pain Descriptors / Indicators: Grimacing Pain Intervention(s): Monitored during session     Hand Dominance Right   Extremity/Trunk Assessment Upper Extremity Assessment Upper Extremity Assessment: RUE deficits/detail RUE Deficits / Details: poor grip strength, generally 3/5 MMT throughout RUE. unable to complete thumb to finger. ROM is slow and deliberate but WFL. tremor noted with effortful movements. decreased sensation from elbow to finger tips. Pt able to use R hand funcitonally for toileting and grooming at the sink, required A to  open plastic toothbrush package. RUE Sensation: decreased light touch RUE Coordination: decreased fine motor;decreased gross motor   Lower Extremity Assessment Lower Extremity Assessment: Defer to PT evaluation   Cervical / Trunk Assessment Cervical / Trunk Assessment: Normal   Communication Communication Communication: No difficulties   Cognition Arousal/Alertness: Awake/alert Behavior During Therapy: Flat affect Overall Cognitive Status: Within Functional Limits for tasks assessed         General Comments: pt is Ox4 and cog is overall WFL for functinoal tasks assessed. Pt had difficulty with following commands during visual assessment     General Comments  VSS on RA, no famil present            Home Living Family/patient expects to be discharged to:: Private residence Living Arrangements: Alone Available Help at Discharge: Family;Available PRN/intermittently (no children, no 24/7 support) Type of Home: House Home Access: Stairs to enter [employer] of Steps: 2 without rail; front door: 5 without rail   Home Layout: One level     Bathroom Shower/Tub: Teacher, early years/pre: Standard     Home Equipment: Conservation officer, nature (2 wheels);Tub bench          Prior Functioning/Environment Prior Level of Function : Independent/Modified Independent;Needs assist       Physical Assist : ADLs (physical)   ADLs (physical): Bathing;IADLs Mobility Comments: uses RW all the time ADLs Comments: tub bench for bathing, family assists with med mgmt & bills as needed        OT Problem List: Decreased strength;Decreased range of motion;Decreased activity  tolerance;Impaired balance (sitting and/or standing);Decreased safety awareness;Decreased knowledge of use of DME or AE;Decreased knowledge of precautions      OT Treatment/Interventions: Self-care/ADL training;Therapeutic exercise;Splinting;Therapeutic activities;Patient/family education;Balance training     OT Goals(Current goals can be found in the care plan section) Acute Rehab OT Goals Patient Stated Goal: get better OT Goal Formulation: With patient Time For Goal Achievement: 10/01/21 Potential to Achieve Goals: Good ADL Goals Pt Will Perform Grooming: with modified independence;standing Pt Will Perform Lower Body Bathing: with modified independence;sit to/from stand Pt Will Perform Lower Body Dressing: with modified independence;sit to/from stand Pt Will Transfer to Toilet: with modified independence;ambulating;regular height toilet;grab bars Additional ADL Goal #1: Pt will indep verbalize at least 3 fall prevention strategies to apply to the home setting  OT Frequency: Min 2X/week       AM-PAC OT "6 Clicks" Daily Activity     Outcome Measure Help from another person eating meals?: A Little Help from another person taking care of personal grooming?: A Little Help from another person toileting, which includes using toliet, bedpan, or urinal?: A Little Help from another person bathing (including washing, rinsing, drying)?: A Little Help from another person to put on and taking off regular upper body clothing?: None Help from another person to put on and taking off regular lower body clothing?: A Little 6 Click Score: 19   End of Session Equipment Utilized During Treatment: Gait belt;Rolling walker (2 wheels) Nurse Communication: Mobility status  Activity Tolerance: Patient tolerated treatment well Patient left: in chair;with call bell/phone within reach;with nursing/sitter in room  OT Visit Diagnosis: Unsteadiness on feet (R26.81);Other abnormalities of gait and mobility (R26.89);Muscle weakness (generalized) (M62.81);Pain;Hemiplegia and hemiparesis Hemiplegia - Right/Left: Right Hemiplegia - dominant/non-dominant: Dominant Hemiplegia - caused by: Unspecified                Time: 1749-4496 OT Time Calculation (min): 21 min Charges:  OT General Charges $OT Visit: 1 Visit OT  Evaluation $OT Eval Moderate Complexity: 1 Mod   Najwa Spillane A Mirella Gueye 09/17/2021, 10:47 AM

## 2021-09-17 NOTE — Assessment & Plan Note (Signed)
Continue CPAP nightly. °

## 2021-09-17 NOTE — Progress Notes (Signed)
CRITICAL VALUE STICKER  CRITICAL VALUE: K+=2.7  RECEIVER (on-site recipient of call):Burl Tauzin RN  DATE & TIME NOTIFIED: 09/17/2021 0538  MESSENGER (representative from lab):Wiliam Ke  MD NOTIFIED: Dr. Marlowe Sax  TIME OF NOTIFICATION:0610  RESPONSE:  New orders placed in [facility] to be carried out.

## 2021-09-17 NOTE — Progress Notes (Signed)
SLP Cancellation Note  Patient Details Name: [name] MRN: [id number] DOB: Apr 07, 1951   Cancelled treatment:       Reason Eval/Treat Not Completed: SLP screened, no needs identified, will sign off Pt did not present with any acute speech, language, or cognitive-linguistic deficits on admission, MRI was negative, and pt denied any change in the aforementioned areas. A formal evaluation does not appear to be clinically indicated at this time. SLP will sign off.  Deagan Sevin I. Hardin Negus, [place], [place] Office number [phone] Pager [place] 09/17/2021, 5:23 PM

## 2021-09-17 NOTE — Progress Notes (Signed)
Echocardiogram 2D Echocardiogram has been performed.  [name] 09/17/2021, 9:47 AM

## 2021-09-17 NOTE — Assessment & Plan Note (Signed)
Continue Synthroid °

## 2021-09-17 NOTE — Progress Notes (Signed)
Inpatient Rehab Admissions Coordinator:   Per therapy recommendations, pt was screened for CIR candidacy by Shann Medal, PT, DPT.  Note observation status, and I will rescreen once medical workup complete to determine whether pt has the medical necessity to support a CIR admission.   Shann Medal, PT, DPT Admissions Coordinator [phone] 09/17/21  3:58 PM

## 2021-09-17 NOTE — Consult Note (Signed)
Neurology Consultation Reason for Consult: Right-sided weakness Referring Physician: Posey Pronto, V  CC: Right-sided weakness  History is obtained from: Patient  HPI: [name] is a 71 y.o. female who woke up 1/24 morning with right-sided numbness and weakness.  She states that it feels similar to when she had her previous stroke.  Involves the face the arm and leg.  She also notes that her hand is tremoring.  Due to the symptoms, she sought care in the emergency department where a CT was performed which was negative for acute findings.  She also complains of headache.   LKW: 1/23 prior to bed tpa given?: no, outside window  ROS: A 14 point ROS was performed and is negative except as noted in the HPI.   Past Medical History:  Diagnosis Date   Arthritis    "all over my body" (03/13/2013)   Asthma    Asymptomatic carotid artery stenosis, bilateral 10/08/2018   GERD (gastroesophageal reflux disease)    H/O hiatal hernia    Heart murmur    Hypercholesterolemia 10/08/2018   Hypertension    Hypothyroidism    Myasthenia gravis ([place])    "in my eyes; diagnsosed > 7 yr ago" (03/13/2013)   Sleep apnea    on CPAP   Thyroid carcinoma ([place])    Type II diabetes mellitus ([place])      Family History  Problem Relation Age of Onset   Coronary artery disease Sister        s/p coronary stenting   Stroke Sister 76   Diabetes Sister    Congestive Heart Failure Mother    Asthma Mother    Diabetes Mother    Congestive Heart Failure Father    Lung cancer Father    Asthma Father    Diabetes Father    Diabetes Brother      Social History:  reports that she has never smoked. She has never used smokeless tobacco. She reports that she does not drink alcohol and does not use drugs.   Exam: Current vital signs: BP (!) 145/64 (BP Location: Left Arm)    Pulse 88    Temp 98.2 F (36.8 C) (Oral)    Resp (!) 21    Ht 5\' 8"  (1.727 m)    Wt 83 kg    SpO2 97%    BMI 27.83 kg/m  Vital signs in last 24  hours: Temp:  [98.2 F (36.8 C)] 98.2 F (36.8 C) (01/24 2000) Pulse Rate:  [87-95] 88 (01/24 2210) Resp:  [17-21] 21 (01/24 2210) BP: (132-173)/(55-83) 145/64 (01/24 2210) SpO2:  [95 %-100 %] 97 % (01/24 2210) Weight:  [83 kg] 83 kg (01/24 1525)   Physical Exam  Constitutional: Appears well-developed and well-nourished.  Psych: Affect appropriate to situation Eyes: No scleral injection HENT: No OP obstruction MSK: no joint deformities.  Cardiovascular: Normal rate and regular rhythm.  Respiratory: Effort normal, non-labored breathing GI: Soft.  No distension. There is no tenderness.  Skin: WDI  Neuro: Mental Status: Patient is awake, alert, oriented to person, place, month, year, and situation. Patient is able to give a clear and coherent history. No signs of aphasia or neglect Cranial Nerves: II: Visual Fields are full. Pupils are equal, round, and reactive to light.   III,IV, VI: EOMI without ptosis or diploplia.  V: Facial sensation is diminished on the right side, she splits midline to light touch and to vibration on the forehead VII: Facial movement is symmetric.  VIII: hearing is intact  to voice X: Uvula elevates symmetrically XI: Shoulder shrug is symmetric. XII: tongue is midline without atrophy or fasciculations.  Motor: Tone is normal. Bulk is normal. 5/5 strength was present on the left, she has mild drift in the right arm and leg.  She has a distractible tremor in the right hand Sensory: Sensation is diminished on the right Cerebellar: No ataxia on finger-nose-finger, there is tremor on the right but no past-pointing.   I have reviewed labs in epic and the results pertinent to this consultation are: Creatinine 1.1  I have reviewed the images obtained: CT head is negative  Impression: 71 year old female with a history of previous stroke who presents with right-sided numbness and weakness.  There are some findings on exam concerning for possible  nonphysiologic activity  Recommendations: 1) Reglan 10 mg x 1 2) MRI brain, MRA head and neck 3) continue aspirin, add Plavix if MRI is positive 4) continue home prednisone for myasthenia 5) lipids, A1c 6) echo 7) stroke team to follow   [name], MD [facility] [phone]  If 7pm- 7am, please page neurology on call as listed in [place].

## 2021-09-17 NOTE — Progress Notes (Signed)
Inpatient Diabetes Program Recommendations  AACE/ADA: New Consensus Statement on Inpatient Glycemic Control (2015)  Target Ranges:  Prepandial:   less than 140 mg/dL      Peak postprandial:   less than 180 mg/dL (1-2 hours)      Critically ill patients:  140 - 180 mg/dL   Lab Results  Component Value Date   GLUCAP 430 (H) 09/17/2021   HGBA1C 9.1 (H) 12/20/2019    Review of Glycemic Control  Diabetes history: DM2 Outpatient Diabetes medications: Insulin pump, Jardiance 25 mg QD Current orders for Inpatient glycemic control: Semglee 15 units QD, Novolog 0-15 units TID with meals and 0-5 HS  HgbA1C - 9.1% Has OmniPod insulin pump (at home, do not have pump settings) CBGs 270, 252, 395 mg/dL  Inpatient Diabetes Program Recommendations:    Add Novolog 5 units TID with meals.  Spoke with pt at bedside. Pt states she took off her pump at home. Did not know basal/bolus settings.   Will check back with pt about her diabetes control at home and OmniPod settings.  Thank you. Lorenda Peck, RD, LDN, CDE Inpatient Diabetes Coordinator [phone]

## 2021-09-17 NOTE — Assessment & Plan Note (Signed)
Follows with [facility] neurology.  CellCept recently held due to diarrhea which has since resolved.  Continue chronic prednisone 10 mg daily.  She receives Rituxan and IVIG infusions through [facility].

## 2021-09-17 NOTE — Progress Notes (Signed)
STROKE TEAM PROGRESS NOTE   SUBJECTIVE (INTERVAL HISTORY) No family is at the bedside.  Overall her condition is stable. She still complaining of right face arm and leg numbness as well as right arm and leg weakness. No improvement so far.   OBJECTIVE Temp:  [98.2 F (36.8 C)-99 F (37.2 C)] 98.6 F (37 C) (01/25 1109) Pulse Rate:  [76-95] 76 (01/25 1109) Cardiac Rhythm: Normal sinus rhythm (01/25 0832) Resp:  [14-21] 16 (01/25 1109) BP: (122-173)/(30-83) 131/54 (01/25 1109) SpO2:  [92 %-100 %] 96 % (01/25 1109) Weight:  [83 kg] 83 kg (01/24 1525)  Recent Labs  Lab 09/17/21 0031 09/17/21 0719 09/17/21 1113  GLUCAP 270* 270* 252*   Recent Labs  Lab 09/16/21 1528 09/17/21 0404 09/17/21 0725  NA 136 135  --   K 3.0* 2.7* 3.0*  CL 100 102  --   CO2 24 25  --   GLUCOSE 239* 217*  --   BUN 14 9  --   CREATININE 1.10* 1.06*  --   CALCIUM 9.6 8.8*  --   MG  --  1.4*  --    No results for input(s): AST, ALT, ALKPHOS, BILITOT, PROT, ALBUMIN in the last 168 hours. Recent Labs  Lab 09/16/21 1528 09/17/21 0404  WBC 4.3 4.2  HGB 12.4 10.8*  HCT 37.9 32.9*  MCV 91.5 92.7  PLT 224 192   No results for input(s): CKTOTAL, CKMB, CKMBINDEX, TROPONINI in the last 168 hours. No results for input(s): LABPROT, INR in the last 72 hours. No results for input(s): COLORURINE, LABSPEC, [place], GLUCOSEU, HGBUR, BILIRUBINUR, KETONESUR, PROTEINUR, UROBILINOGEN, NITRITE, LEUKOCYTESUR in the last 72 hours.  Invalid input(s): APPERANCEUR     Component Value Date/Time   CHOL 197 09/17/2021 0404   CHOL 198 05/29/2021 0949   TRIG 149 09/17/2021 0404   HDL 47 09/17/2021 0404   HDL 72 05/29/2021 0949   CHOLHDL 4.2 09/17/2021 0404   VLDL 30 09/17/2021 0404   LDLCALC 120 (H) 09/17/2021 0404   LDLCALC 96 05/29/2021 0949   Lab Results  Component Value Date   HGBA1C 9.1 (H) 12/20/2019      Component Value Date/Time   LABOPIA NONE DETECTED 12/25/2019 0647   COCAINSCRNUR NONE DETECTED  12/25/2019 0647   LABBENZ NONE DETECTED 12/25/2019 0647   AMPHETMU NONE DETECTED 12/25/2019 0647   THCU NONE DETECTED 12/25/2019 0647   LABBARB NONE DETECTED 12/25/2019 0647    No results for input(s): ETH in the last 168 hours.  I have personally reviewed the radiological images below and agree with the radiology interpretations.  DG Chest 2 View  Result Date: 09/16/2021 CLINICAL DATA:  Chest pain and short of breath. EXAM: CHEST - 2 VIEW COMPARISON:  12/19/2019 FINDINGS: Cardiac and mediastinal contours normal. Lungs are well aerated and clear. No infiltrate or effusion. Port-A-Cath tip in the SVC at the cavoatrial junction unchanged. IMPRESSION: No active cardiopulmonary disease. Electronically Signed   By: Franchot Gallo M.D.   On: 09/16/2021 16:07   CT Head Wo Contrast  Result Date: 09/16/2021 CLINICAL DATA:  Neurological deficit EXAM: CT HEAD WITHOUT CONTRAST TECHNIQUE: Contiguous axial images were obtained from the base of the skull through the vertex without intravenous contrast. RADIATION DOSE REDUCTION: This exam was performed according to the departmental dose-optimization program which includes automated exposure control, adjustment of the mA and/or kV according to patient size and/or use of iterative reconstruction technique. COMPARISON:  MR brain done on 11/09/2020 FINDINGS: Brain: No acute intracranial findings  are seen. There are no signs of bleeding within the cranium. Cortical sulci are prominent. There is decreased density in the periventricular white matter. Vascular: Unremarkable. Skull: Unremarkable. Sinuses/Orbits: Mucosal thickening and mucous retention cysts are seen in the sphenoid and maxillary sinuses. Other: None IMPRESSION: No acute intracranial findings are seen.  Atrophy. Chronic sinusitis. Electronically Signed   By: Elmer Picker M.D.   On: 09/16/2021 17:14   MR ANGIO HEAD WO CONTRAST  Result Date: 09/17/2021 CLINICAL DATA:  Stroke follow-up. Right facial  numbness and arm numbness EXAM: MRI HEAD WITHOUT CONTRAST MRA HEAD WITHOUT CONTRAST MRA NECK WITHOUT CONTRAST TECHNIQUE: Multiplanar, multiecho pulse sequences of the brain and surrounding structures were obtained without intravenous contrast. Angiographic images of the Circle of Willis were obtained using MRA technique without intravenous contrast. Angiographic images of the neck were obtained using MRA technique without intravenous contrast. Carotid stenosis measurements (when applicable) are obtained utilizing NASCET criteria, using the distal internal carotid diameter as the denominator. COMPARISON:  Head CT from yesterday.  Brain MRI 11/09/2020 FINDINGS: MRI HEAD FINDINGS Brain: No acute infarction, hemorrhage, hydrocephalus, extra-axial collection or mass lesion. Vascular: See below Skull and upper cervical spine: Normal marrow signal. C3-4 and C4-5 cervical spine fusion. Central protrusion and ligamentous thickening at C2-3 encroaching on the cord. No evident progression - less prominent cord impingement is likely from slice selection. Sinuses/Orbits: Bilateral cataract resection. Inspissated material in the left sphenoid sinus, chronic. Retention cysts in the right maxillary sinus MRA HEAD FINDINGS No branch occlusion, beading, flow limiting stenosis, or aneurysm. Mild atheromatous irregularity to medium size branches. MRA NECK FINDINGS Antegrade flow in the carotid and vertebral arteries. Right vertebral artery is dominant. No evidence of proximal subclavian stenosis. The left vertebral artery appears to arise from the arch. Wasting of proximal right ICA lumen with 70% measured stenosis, although likely overestimated due to flow artifact. On the left, proximal ICA stenosis measures 60%. Irregularity of the ICA vessels at the skull base, likely artifact. No suspected flow limiting stenosis in the vertebral arteries. IMPRESSION: Brain MRI: No acute finding. Intracranial MRA: No emergent finding.  Mild  atherosclerosis. Neck MRA: Evidence of flow reducing stenosis at the right more than left proximal ICA. Suggest CTA characterization given the limitations of noncontrast MRA. Electronically Signed   By: Jorje Guild M.D.   On: 09/17/2021 07:19   MR ANGIO NECK WO CONTRAST  Result Date: 09/17/2021 CLINICAL DATA:  Stroke follow-up. Right facial numbness and arm numbness EXAM: MRI HEAD WITHOUT CONTRAST MRA HEAD WITHOUT CONTRAST MRA NECK WITHOUT CONTRAST TECHNIQUE: Multiplanar, multiecho pulse sequences of the brain and surrounding structures were obtained without intravenous contrast. Angiographic images of the Circle of Willis were obtained using MRA technique without intravenous contrast. Angiographic images of the neck were obtained using MRA technique without intravenous contrast. Carotid stenosis measurements (when applicable) are obtained utilizing NASCET criteria, using the distal internal carotid diameter as the denominator. COMPARISON:  Head CT from yesterday.  Brain MRI 11/09/2020 FINDINGS: MRI HEAD FINDINGS Brain: No acute infarction, hemorrhage, hydrocephalus, extra-axial collection or mass lesion. Vascular: See below Skull and upper cervical spine: Normal marrow signal. C3-4 and C4-5 cervical spine fusion. Central protrusion and ligamentous thickening at C2-3 encroaching on the cord. No evident progression - less prominent cord impingement is likely from slice selection. Sinuses/Orbits: Bilateral cataract resection. Inspissated material in the left sphenoid sinus, chronic. Retention cysts in the right maxillary sinus MRA HEAD FINDINGS No branch occlusion, beading, flow limiting stenosis, or aneurysm. Mild atheromatous  irregularity to medium size branches. MRA NECK FINDINGS Antegrade flow in the carotid and vertebral arteries. Right vertebral artery is dominant. No evidence of proximal subclavian stenosis. The left vertebral artery appears to arise from the arch. Wasting of proximal right ICA lumen  with 70% measured stenosis, although likely overestimated due to flow artifact. On the left, proximal ICA stenosis measures 60%. Irregularity of the ICA vessels at the skull base, likely artifact. No suspected flow limiting stenosis in the vertebral arteries. IMPRESSION: Brain MRI: No acute finding. Intracranial MRA: No emergent finding.  Mild atherosclerosis. Neck MRA: Evidence of flow reducing stenosis at the right more than left proximal ICA. Suggest CTA characterization given the limitations of noncontrast MRA. Electronically Signed   By: Jorje Guild M.D.   On: 09/17/2021 07:19   MR BRAIN WO CONTRAST  Result Date: 09/17/2021 CLINICAL DATA:  Stroke follow-up. Right facial numbness and arm numbness EXAM: MRI HEAD WITHOUT CONTRAST MRA HEAD WITHOUT CONTRAST MRA NECK WITHOUT CONTRAST TECHNIQUE: Multiplanar, multiecho pulse sequences of the brain and surrounding structures were obtained without intravenous contrast. Angiographic images of the Circle of Willis were obtained using MRA technique without intravenous contrast. Angiographic images of the neck were obtained using MRA technique without intravenous contrast. Carotid stenosis measurements (when applicable) are obtained utilizing NASCET criteria, using the distal internal carotid diameter as the denominator. COMPARISON:  Head CT from yesterday.  Brain MRI 11/09/2020 FINDINGS: MRI HEAD FINDINGS Brain: No acute infarction, hemorrhage, hydrocephalus, extra-axial collection or mass lesion. Vascular: See below Skull and upper cervical spine: Normal marrow signal. C3-4 and C4-5 cervical spine fusion. Central protrusion and ligamentous thickening at C2-3 encroaching on the cord. No evident progression - less prominent cord impingement is likely from slice selection. Sinuses/Orbits: Bilateral cataract resection. Inspissated material in the left sphenoid sinus, chronic. Retention cysts in the right maxillary sinus MRA HEAD FINDINGS No branch occlusion, beading,  flow limiting stenosis, or aneurysm. Mild atheromatous irregularity to medium size branches. MRA NECK FINDINGS Antegrade flow in the carotid and vertebral arteries. Right vertebral artery is dominant. No evidence of proximal subclavian stenosis. The left vertebral artery appears to arise from the arch. Wasting of proximal right ICA lumen with 70% measured stenosis, although likely overestimated due to flow artifact. On the left, proximal ICA stenosis measures 60%. Irregularity of the ICA vessels at the skull base, likely artifact. No suspected flow limiting stenosis in the vertebral arteries. IMPRESSION: Brain MRI: No acute finding. Intracranial MRA: No emergent finding.  Mild atherosclerosis. Neck MRA: Evidence of flow reducing stenosis at the right more than left proximal ICA. Suggest CTA characterization given the limitations of noncontrast MRA. Electronically Signed   By: Jorje Guild M.D.   On: 09/17/2021 07:19     PHYSICAL EXAM  Temp:  [98.2 F (36.8 C)-99 F (37.2 C)] 98.6 F (37 C) (01/25 1109) Pulse Rate:  [76-95] 76 (01/25 1109) Resp:  [14-21] 16 (01/25 1109) BP: (122-173)/(30-83) 131/54 (01/25 1109) SpO2:  [92 %-100 %] 96 % (01/25 1109) Weight:  [83 kg] 83 kg (01/24 1525)  General - Well nourished, well developed, in no apparent distress. Lethargic   Ophthalmologic - fundi not visualized due to noncooperation.  Cardiovascular - Regular rhythm and rate.  Mental Status -  Level of arousal and orientation to time, place, and person were intact. Language including expression, naming, repetition, comprehension was assessed and found intact. Fund of Knowledge was assessed and was intact.  Cranial Nerves II - XII - II - Visual field intact  OU. III, IV, VI - Extraocular movements intact. Left mild ptosis. Subjective diplopia  V - Facial sensation decreased on the right VII - Facial movement intact bilaterally. VIII - Hearing & vestibular intact bilaterally. X - Palate elevates  symmetrically. XI - Chin turning & shoulder shrug intact bilaterally. XII - Tongue protrusion intact.  Motor Strength - The patients strength was normal in all LUE and LLE, however, RUE and RLE lack of effort on exam, RUE with action tremor, proximal 4/5 and distal 3/5 with finger grip. RLE 4+/5 proximal and distally.  Bulk was normal and fasciculations were absent.   Motor Tone - Muscle tone was assessed at the neck and appendages and was normal.  Reflexes - The patients reflexes were symmetrical in all extremities and she had no pathological reflexes.  Sensory - Light touch, temperature/pinprick were assessed and were decreased on the right UE and LE.    Coordination - The patient had normal movements in the hands with no ataxia or dysmetria although slow on the right.  Action and postural tremor on the right UE.  Gait and Station - deferred.   ASSESSMENT/PLAN [name] is a 71 y.o. female with history of hypertension, hyperlipidemia, diabetes, stroke, seronegative ocular myasthenia gravis, OSA on CPAP admitted for right-sided weakness and numbness and headache. No tPA given due to OOW.    TIA could be due to bilateral carotid stenosis Resultant still has lingering right sided weakness and numbness MRI no acute infarct MRA head unremarkable MRA neck flow reducing stenosis at right more than left proximal ICA CTA head and neck pending with 13 hours allergy preparation 2D Echo EF 65 to 70% LDL 120 HgbA1c pending SCDs for VTE prophylaxis aspirin 81 mg daily prior to admission, now on aspirin 325 mg daily and clopidogrel 75 mg daily.  Patient counseled to be compliant with her antithrombotic medications Ongoing aggressive stroke risk factor management Therapy recommendations: CIR Disposition: Pending  History of stroke 11/2019 admitted for left thalamic stroke.  MRA showed right MCA atherosclerosis.  Carotid Doppler right ICA > 70% stenosis, left ICA 50 to 69% stenosis.   EF 60 to 65%, DVT negative, LDL 101, A1c 9.1.  Discharged on DAPT and Crestor 40.  Diabetes HgbA1c pending goal < 7.0 Uncontrolled Currently on insulin CBG monitoring SSI DM education and close PCP follow up  Hypertension Stable Avoid low BP Long term BP goal 1 30-1 50 before carotid revascularization  Hyperlipidemia Home meds: Crestor 40, Zetia 10 LDL 120, goal < 70 Now on Crestor 40 and Zetia 10 Continue statin at discharge  Other Stroke Risk Factors Advanced age Migraines on Topamax Obstructive sleep apnea, on CPAP at home  Other Active Problems Seronegative ocular MG, on prednisone 10  Hospital day # 0    Rosalin Hawking, MD PhD Stroke Neurology 09/17/2021 12:06 PM    To contact Stroke Continuity provider, please refer to [email or web address] After hours, contact General Neurology

## 2021-09-17 NOTE — Progress Notes (Signed)
°  Transition of Care [facility]) Screening Note   Patient Details  Name: [name] Date of Birth: Dec 04, 1950   [facility] CM/SW Contact:    Geralynn Ochs, LCSW Phone Number: 09/17/2021, 3:24 PM    Transition of Care Department [facility]) has reviewed patient and no TOC needs have been identified at this time; medical workup ongoing. We will continue to monitor patient advancement through interdisciplinary progression rounds. If new patient transition needs arise, please place a TOC consult.

## 2021-09-17 NOTE — Assessment & Plan Note (Signed)
Hold antihypertensives for now to allow for permissive hypertension.

## 2021-09-18 ENCOUNTER — Observation Stay (HOSPITAL_COMMUNITY): Payer: Medicare Other

## 2021-09-18 ENCOUNTER — Ambulatory Visit: Payer: Medicare Other | Admitting: Cardiology

## 2021-09-18 DIAGNOSIS — E1159 Type 2 diabetes mellitus with other circulatory complications: Secondary | ICD-10-CM | POA: Diagnosis not present

## 2021-09-18 DIAGNOSIS — M4322 Fusion of spine, cervical region: Secondary | ICD-10-CM | POA: Diagnosis not present

## 2021-09-18 DIAGNOSIS — Z801 Family history of malignant neoplasm of trachea, bronchus and lung: Secondary | ICD-10-CM

## 2021-09-18 DIAGNOSIS — G459 Transient cerebral ischemic attack, unspecified: Secondary | ICD-10-CM

## 2021-09-18 DIAGNOSIS — R2990 Unspecified symptoms and signs involving the nervous system: Secondary | ICD-10-CM | POA: Diagnosis not present

## 2021-09-18 DIAGNOSIS — I6523 Occlusion and stenosis of bilateral carotid arteries: Secondary | ICD-10-CM | POA: Diagnosis not present

## 2021-09-18 DIAGNOSIS — I672 Cerebral atherosclerosis: Secondary | ICD-10-CM | POA: Diagnosis not present

## 2021-09-18 DIAGNOSIS — Z8249 Family history of ischemic heart disease and other diseases of the circulatory system: Secondary | ICD-10-CM

## 2021-09-18 DIAGNOSIS — Z794 Long term (current) use of insulin: Secondary | ICD-10-CM | POA: Diagnosis not present

## 2021-09-18 DIAGNOSIS — E1169 Type 2 diabetes mellitus with other specified complication: Secondary | ICD-10-CM | POA: Diagnosis not present

## 2021-09-18 DIAGNOSIS — E039 Hypothyroidism, unspecified: Secondary | ICD-10-CM | POA: Diagnosis not present

## 2021-09-18 DIAGNOSIS — Z7982 Long term (current) use of aspirin: Secondary | ICD-10-CM

## 2021-09-18 DIAGNOSIS — E785 Hyperlipidemia, unspecified: Secondary | ICD-10-CM | POA: Diagnosis not present

## 2021-09-18 DIAGNOSIS — Z833 Family history of diabetes mellitus: Secondary | ICD-10-CM

## 2021-09-18 DIAGNOSIS — E119 Type 2 diabetes mellitus without complications: Secondary | ICD-10-CM | POA: Diagnosis not present

## 2021-09-18 DIAGNOSIS — Z8673 Personal history of transient ischemic attack (TIA), and cerebral infarction without residual deficits: Secondary | ICD-10-CM | POA: Diagnosis not present

## 2021-09-18 DIAGNOSIS — G7000 Myasthenia gravis without (acute) exacerbation: Secondary | ICD-10-CM | POA: Diagnosis not present

## 2021-09-18 DIAGNOSIS — Z91041 Radiographic dye allergy status: Secondary | ICD-10-CM

## 2021-09-18 DIAGNOSIS — R200 Anesthesia of skin: Secondary | ICD-10-CM | POA: Diagnosis not present

## 2021-09-18 DIAGNOSIS — G4733 Obstructive sleep apnea (adult) (pediatric): Secondary | ICD-10-CM | POA: Diagnosis not present

## 2021-09-18 DIAGNOSIS — Z981 Arthrodesis status: Secondary | ICD-10-CM | POA: Diagnosis not present

## 2021-09-18 DIAGNOSIS — I152 Hypertension secondary to endocrine disorders: Secondary | ICD-10-CM | POA: Diagnosis not present

## 2021-09-18 DIAGNOSIS — I10 Essential (primary) hypertension: Secondary | ICD-10-CM | POA: Diagnosis not present

## 2021-09-18 LAB — CBC WITH DIFFERENTIAL/PLATELET
Abs Immature Granulocytes: 0.04 10*3/uL (ref 0.00–0.07)
Basophils Absolute: 0 10*3/uL (ref 0.0–0.1)
Basophils Relative: 0 %
Eosinophils Absolute: 0 10*3/uL (ref 0.0–0.5)
Eosinophils Relative: 0 %
HCT: 35.2 % — ABNORMAL LOW (ref 36.0–46.0)
Hemoglobin: 11.6 g/dL — ABNORMAL LOW (ref 12.0–15.0)
Immature Granulocytes: 1 %
Lymphocytes Relative: 7 %
Lymphs Abs: 0.4 10*3/uL — ABNORMAL LOW (ref 0.7–4.0)
MCH: 30.1 pg (ref 26.0–34.0)
MCHC: 33 g/dL (ref 30.0–36.0)
MCV: 91.2 fL (ref 80.0–100.0)
Monocytes Absolute: 0.1 10*3/uL (ref 0.1–1.0)
Monocytes Relative: 2 %
Neutro Abs: 5 10*3/uL (ref 1.7–7.7)
Neutrophils Relative %: 90 %
Platelets: 209 10*3/uL (ref 150–400)
RBC: 3.86 MIL/uL — ABNORMAL LOW (ref 3.87–5.11)
RDW: 13.3 % (ref 11.5–15.5)
WBC: 5.5 10*3/uL (ref 4.0–10.5)
nRBC: 0 % (ref 0.0–0.2)

## 2021-09-18 LAB — BASIC METABOLIC PANEL
Anion gap: 8 (ref 5–15)
BUN: 13 mg/dL (ref 8–23)
CO2: 25 mmol/L (ref 22–32)
Calcium: 9.3 mg/dL (ref 8.9–10.3)
Chloride: 104 mmol/L (ref 98–111)
Creatinine, Ser: 1.24 mg/dL — ABNORMAL HIGH (ref 0.44–1.00)
GFR, Estimated: 47 mL/min — ABNORMAL LOW (ref >60)
Glucose, Bld: 399 mg/dL — ABNORMAL HIGH (ref 70–99)
Potassium: 3.5 mmol/L (ref 3.5–5.1)
Sodium: 137 mmol/L (ref 135–145)

## 2021-09-18 LAB — GLUCOSE, CAPILLARY
Glucose-Capillary: 448 mg/dL — ABNORMAL HIGH (ref 70–99)
Glucose-Capillary: 460 mg/dL — ABNORMAL HIGH (ref 70–99)
Glucose-Capillary: 471 mg/dL — ABNORMAL HIGH (ref 70–99)
Glucose-Capillary: 403 mg/dL — ABNORMAL HIGH (ref 70–99)
Glucose-Capillary: 429 mg/dL — ABNORMAL HIGH (ref 70–99)

## 2021-09-18 MED ORDER — SODIUM CHLORIDE 0.9% FLUSH
10.0000 mL | INTRAVENOUS | Status: DC | PRN
  Administered 2021-09-27: 10 mL

## 2021-09-18 MED ORDER — IOHEXOL 350 MG/ML SOLN
50.0000 mL | Freq: Once | INTRAVENOUS | Status: AC | PRN
Start: 2021-09-18 — End: 2021-09-18
  Administered 2021-09-18: 50 mL via INTRAVENOUS

## 2021-09-18 MED ORDER — INSULIN PUMP
Freq: Three times a day (TID) | SUBCUTANEOUS | Status: DC
  Filled 2021-09-18: qty 1

## 2021-09-18 MED ORDER — INSULIN GLARGINE-YFGN 100 UNIT/ML ~~LOC~~ SOLN
20.0000 [IU] | Freq: Every day | SUBCUTANEOUS | Status: DC
  Filled 2021-09-18: qty 0.2

## 2021-09-18 MED ORDER — SPIRONOLACTONE-HCTZ 25-25 MG PO TABS
1.0000 | ORAL_TABLET | ORAL | Status: DC
Start: 2021-09-19 — End: 2021-09-18

## 2021-09-18 MED ORDER — INSULIN GLARGINE-YFGN 100 UNIT/ML ~~LOC~~ SOLN
25.0000 [IU] | Freq: Every day | SUBCUTANEOUS | Status: DC
  Administered 2021-09-18: 25 [IU] via SUBCUTANEOUS
  Filled 2021-09-18 (×2): qty 0.25

## 2021-09-18 MED ORDER — HYDROCHLOROTHIAZIDE 25 MG PO TABS
25.0000 mg | ORAL_TABLET | Freq: Every day | ORAL | Status: DC
  Administered 2021-09-19 – 2021-09-20 (×2): 25 mg via ORAL
  Filled 2021-09-18 (×2): qty 1

## 2021-09-18 MED ORDER — INSULIN ASPART 100 UNIT/ML IJ SOLN
5.0000 [IU] | Freq: Three times a day (TID) | INTRAMUSCULAR | Status: DC
  Administered 2021-09-18 – 2021-09-23 (×15): 5 [IU] via SUBCUTANEOUS

## 2021-09-18 MED ORDER — SPIRONOLACTONE 25 MG PO TABS
25.0000 mg | ORAL_TABLET | Freq: Every day | ORAL | Status: DC
  Administered 2021-09-19 – 2021-09-20 (×2): 25 mg via ORAL
  Filled 2021-09-18 (×2): qty 1

## 2021-09-18 MED ORDER — CHLORHEXIDINE GLUCONATE CLOTH 2 % EX PADS
6.0000 | MEDICATED_PAD | Freq: Every day | CUTANEOUS | Status: DC
  Administered 2021-09-18 – 2021-09-26 (×9): 6 via TOPICAL

## 2021-09-18 MED ORDER — INSULIN ASPART 100 UNIT/ML IJ SOLN
12.0000 [IU] | Freq: Once | INTRAMUSCULAR | Status: AC
  Administered 2021-09-18: 12 [IU] via SUBCUTANEOUS

## 2021-09-18 MED ORDER — INSULIN ASPART 100 UNIT/ML IJ SOLN
10.0000 [IU] | Freq: Once | INTRAMUSCULAR | Status: AC
  Administered 2021-09-18: 10 [IU] via SUBCUTANEOUS

## 2021-09-18 NOTE — Progress Notes (Signed)
Physical Therapy Treatment Patient Details Name: [name] MRN: [id number] DOB: 1950/10/23 Today's Date: 09/18/2021   History of Present Illness Pt is a 71 y.o. female who presented with chest pain, right facial numbness and arm numbness 09/16/21, MRI/MRA 1/25 negative for acute intracranial changes with evidence of flow reducing stenosis at the right more than left proximal ICA. CT 1/26 negative for acute changes. PMH significant for seronegative myasthenia gravis, hypertension, asymptomatic carotid artery stenosis, Prior stroke in April 2021, Paraneoplastic retinopathy.    PT Comments    Pt progressing towards physical therapy goals. She was able to tolerate increased ambulation out to hallway this session, however RLE fatigued quickly. Pt reports new tremor in RUE is effecting her ability to feed herself and she has been ordering foods "that will not shake off the spoon". Continue to feel this patient has the potential to get to a mod I level with intensive multidisciplinary rehab. Will continue to follow and progress as able per POC.    Recommendations for follow up therapy are one component of a multi-disciplinary discharge planning process, led by the attending physician.  Recommendations may be updated based on patient status, additional functional criteria and insurance authorization.  Follow Up Recommendations  Acute inpatient rehab (3hours/day)     Assistance Recommended at Discharge Intermittent Supervision/Assistance  Patient can return home with the following A little help with walking and/or transfers;Assist for transportation;Help with stairs or ramp for entrance;Assistance with cooking/housework   Equipment Recommendations  None recommended by PT    Recommendations for Other Services Rehab consult     Precautions / Restrictions Precautions Precautions: Fall Precaution Comments: Double vision Restrictions Weight Bearing Restrictions: No     Mobility  Bed  Mobility Overal bed mobility: Modified Independent             General bed mobility comments: with HOB elevated. No assist required and min use of rails .    Transfers Overall transfer level: Needs assistance Equipment used: Rolling walker (2 wheels) Transfers: Sit to/from Stand Sit to Stand: Min guard, Supervision           General transfer comment: Light guard as pt powered up to full stand. Was able to control lower down to the bed at end of session. No unsteadiness or LOB noted.    Ambulation/Gait Ambulation/Gait assistance: Min guard, Supervision Gait Distance (Feet): 200 Feet Assistive device: Rolling walker (2 wheels) Gait Pattern/deviations: Step-through pattern, Decreased stride length, Trunk flexed, Decreased dorsiflexion - right, Decreased weight shift to right, Decreased stance time - right Gait velocity: Decreased Gait velocity interpretation: <1.31 ft/sec, indicative of household ambulator   General Gait Details: Progressed ambulation out to hallway. Increased effort for RLE advancement and heel strike. As pt fatigued, noted decreased floor clearance and hip thrust to assist in advancement of RLE.   Stairs             Wheelchair Mobility    Modified Rankin (Stroke Patients Only) Modified Rankin (Stroke Patients Only) Pre-Morbid Rankin Score: Slight disability Modified Rankin: Moderately severe disability     Balance Overall balance assessment: Needs assistance Sitting-balance support: Feet supported Sitting balance-Leahy Scale: Good     Standing balance support: No upper extremity supported, During functional activity Standing balance-Leahy Scale: Fair Standing balance comment: statically. reliant on UE support for dynamic standing balance/mobility                            Cognition  Arousal/Alertness: Awake/alert Behavior During Therapy: WFL for tasks assessed/performed Overall Cognitive Status: Within Functional Limits for  tasks assessed                                          Exercises      General Comments        Pertinent Vitals/Pain Pain Assessment Pain Assessment: Faces Faces Pain Scale: No hurt Pain Intervention(s): Limited activity within patient's tolerance, Monitored during session, Repositioned    Home Living                          Prior Function            PT Goals (current goals can now be found in the care plan section) Acute Rehab PT Goals Patient Stated Goal: Back to independence at home PT Goal Formulation: With patient Time For Goal Achievement: 10/01/21 Potential to Achieve Goals: Good Progress towards PT goals: Progressing toward goals    Frequency    Min 3X/week      PT Plan Current plan remains appropriate    Co-evaluation              AM-PAC PT "6 Clicks" Mobility   Outcome Measure  Help needed turning from your back to your side while in a flat bed without using bedrails?: None Help needed moving from lying on your back to sitting on the side of a flat bed without using bedrails?: None Help needed moving to and from a bed to a chair (including a wheelchair)?: A Little Help needed standing up from a chair using your arms (e.g., wheelchair or bedside chair)?: A Little Help needed to walk in hospital room?: A Little Help needed climbing 3-5 steps with a railing? : A Little 6 Click Score: 20    End of Session Equipment Utilized During Treatment: Gait belt Activity Tolerance: Patient tolerated treatment well Patient left: in bed;with call bell/phone within reach;with bed alarm set Nurse Communication: Mobility status PT Visit Diagnosis: Unsteadiness on feet (R26.81);Difficulty in walking, not elsewhere classified (R26.2)     Time: 4315-4008 PT Time Calculation (min) (ACUTE ONLY): 22 min  Charges:  $Gait Training: 8-22 mins                     Rolinda Roan, PT, DPT Acute Rehabilitation Services Pager:  [phone] Office: [phone]    Thelma Comp 09/18/2021, 12:51 PM

## 2021-09-18 NOTE — TOC Initial Note (Signed)
Transition of Care [facility]) - Initial/Assessment Note    Patient Details  Name: [name] MRN: [id number] Date of Birth: 15-Mar-1951  Transition of Care [id number]) CM/SW Contact:    Pollie Friar, RN Phone Number: 09/18/2021, 2:31 PM  Clinical Narrative:                 Patient is from home alone. She has recommendations for CIR. Awaiting eval. Pt is currently observation so not sure CIR will be an option.  Pt has family that can provide some intermittent supervision at home but none that can stay 24/7.  Pt states she will not go to a SNF rehab.  Family provides needed transportation. She is responsible for her own medications at home.  TOC following.  Expected Discharge Plan: IP Rehab Facility Barriers to Discharge: Continued Medical Work up   Patient Goals and CMS Choice   CMS Medicare.gov Compare Post Acute Care list provided to:: Patient Choice offered to / list presented to : Patient  Expected Discharge Plan and Services Expected Discharge Plan: [place]   Discharge Planning Services: CM Consult Post Acute Care Choice: IP Rehab Living arrangements for the past 2 months: Single Family Home                                      Prior Living Arrangements/Services Living arrangements for the past 2 months: Single Family Home Lives with:: Self Patient language and need for interpreter reviewed:: Yes Do you feel safe going back to the place where you live?: Yes        Care giver support system in place?: No (comment) Current home services: DME (walker/ shower seat/ insulin pump) Criminal Activity/Legal Involvement Pertinent to Current Situation/Hospitalization: No - Comment as needed  Activities of Daily Living Home Assistive Devices/Equipment: CBG Meter, Wheelchair ADL Screening (condition at time of admission) Patient's cognitive ability adequate to safely complete daily activities?: Yes Is the patient deaf or have difficulty hearing?: No Does the  patient have difficulty seeing, even when wearing glasses/contacts?: No Does the patient have difficulty concentrating, remembering, or making decisions?: No Patient able to express need for assistance with ADLs?: Yes Does the patient have difficulty dressing or bathing?: No Independently performs ADLs?: No Communication: Independent Dressing (OT): Independent Grooming: Independent Feeding: Independent Bathing: Independent Toileting: Independent In/Out Bed: Independent with device (comment) (walker) Walks in Home: Independent with device (comment) (walker) Does the patient have difficulty walking or climbing stairs?: Yes Weakness of Legs: Right Weakness of Arms/Hands: Right  Permission Sought/Granted                  Emotional Assessment Appearance:: Appears stated age Attitude/Demeanor/Rapport: Engaged Affect (typically observed): Accepting Orientation: : Oriented to Self, Oriented to Place, Oriented to  Time, Oriented to Situation   Psych Involvement: No (comment)  Admission diagnosis:  Right facial numbness [R20.0] Stroke-like symptoms [R29.90] Chest pain, unspecified type [R07.9] Patient Active Problem List   Diagnosis Date Noted   Hypokalemia 09/17/2021   Right facial numbness 09/16/2021   Insulin dependent type 2 diabetes mellitus ([place]) 09/16/2021   Hypothyroidism 09/16/2021   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[place]) 12/27/2019   Primary hypertension    Uncomplicated asthma    Uncontrolled type 2 diabetes mellitus with hyperglycemia ([place])    Thalamic stroke ([place]) 12/20/2019   Asymptomatic carotid artery stenosis, bilateral 10/08/2018   Hyperlipidemia associated with type 2 diabetes mellitus (  [place]) 10/08/2018   Headache around the eyes 11/12/2016   Cervicalgia of occipito-atlanto-axial region 11/12/2016   Moderate persistent asthma 07/29/2015   Current use of beta blocker 07/29/2015   Gastroesophageal reflux disease without esophagitis 07/29/2015   OSA on CPAP  09/19/2014   Diabetic neuropathy with neurologic complication ([place]) 82/64/1583   Myasthenia gravis in remission ([place]) 09/19/2014   Dyspnea 08/13/2014   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   Cancer of thyroid ([place]) 07/27/2014   OSA (obstructive sleep apnea) 07/24/2014   DM (diabetes mellitus) type II uncontrolled with eye manifestation ([place]) 09/40/7680   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   Nocturia more than twice per night 06/01/2014   Snoring 06/01/2014   Insomnia due to medical condition 06/01/2014   Neuroma 01/16/2014   Pseudoclaudication 11/14/2013   Heart palpitations 06/30/2013   Sinus tachycardia 03/29/2013   Myasthenia gravis ([place]) 03/13/2013   Essential hypertension, benign 03/13/2013   Asthma, chronic 03/13/2013   PCP:  [name], DO Pharmacy:   [id number] [address], [place] [address] Phone: [phone] Fax: [phone]  [facility] [place], [place] [address] Phone: [phone] Fax: [phone]  [facility] ([place], [place] [address] [address] Phone: [phone] Fax: [phone]  [place], [place] [address] Phone: [phone] Fax: [phone]     Social Determinants of Health (SDOH) Interventions    Readmission Risk Interventions No flowsheet data found.

## 2021-09-18 NOTE — Progress Notes (Signed)
Inpatient Diabetes Program Recommendations  AACE/ADA: New Consensus Statement on Inpatient Glycemic Control (2015)  Target Ranges:  Prepandial:   less than 140 mg/dL      Peak postprandial:   less than 180 mg/dL (1-2 hours)      Critically ill patients:  140 - 180 mg/dL   Lab Results  Component Value Date   GLUCAP 471 (H) 09/18/2021   HGBA1C 9.7 (H) 09/17/2021    Review of Glycemic Control  Latest Reference Range & Units 09/18/21 06:15 09/18/21 08:42 09/18/21 11:09  Glucose-Capillary 70 - 99 mg/dL 448 (H) 460 (H) 471 (H)  (H): Data is abnormally high  Diabetes history: DM2 Outpatient Diabetes medications: Insulin pump.  Does not know settings Current orders for Inpatient glycemic control: Semglee 25 units QD, Novolog 0-15 units TID and 0-5 units QHS, Prednisone 10  Inpatient Diabetes Program Recommendations:    Novolog 0-20 units Q4H   If MD would like patient to place insulin pump back on please wait until breakfast tomorrow as patient had 25 units Semglee at 1100 am today.    Will continue to follow while inpatient.  Thank you, Reche Dixon, MSN, RN Diabetes Coordinator Inpatient Diabetes Program [phone] (team pager from 8a-5p)

## 2021-09-18 NOTE — Progress Notes (Signed)
Pt has returned from CT.  

## 2021-09-18 NOTE — Consult Note (Signed)
[facility] [place]  ASSESSMENT / PLAN: 71 y.o. female with left carotid artery stenosis; after discussion with the internal medicine and neurology teams we all feel this lesion is symptomatic. She also has critical right carotid artery stenosis which is asymptomatic.  I think a TCAR would be best for her, that way we can revascularize her right carotid in the next month.  Continue aspirin and Plavix therapy up until surgery, the day of surgery, and for 1 month post operatively.  Continue high intensity statin therapy.  Plan left TCAR Monday, 09/22/2021.  CHIEF COMPLAINT: Right-sided weakness  HISTORY OF PRESENT ILLNESS: [name] is a 71 y.o. female admitted to the internal medicine service for evaluation of new onset right-sided weakness beginning earlier this week.  Patient was in her usual state of health when she noticed right-sided weakness on Monday or Tuesday of this week.  She delayed her presentation to the ER, but was ultimately persuaded to go by her children.  She suffered a stroke in the past, but thankfully has no residual.  VASCULAR SURGICAL HISTORY: None  VASCULAR RISK FACTORS: Positive history of stroke / transient ischemic attack. Negative history of coronary artery disease.  Positive history of diabetes mellitus. Last A1c 9.7. Negative history of smoking.  Positive history of hypertension.  Negative history of chronic kidney disease.  Negative history of chronic obstructive pulmonary disease.  FUNCTIONAL STATUS: ECOG performance status: (0) Fully active, able to carry on all predisease performance without restriction Ambulatory status: Ambulatory within the community without limits  Past Medical History:  Diagnosis Date   Arthritis    "all over my body" (03/13/2013)   Asthma    Asymptomatic carotid artery stenosis, bilateral 10/08/2018   GERD (gastroesophageal reflux disease)    H/O hiatal hernia    Heart murmur    Hypercholesterolemia  10/08/2018   Hypertension    Hypothyroidism    Myasthenia gravis ([place])    "in my eyes; diagnsosed > 7 yr ago" (03/13/2013)   Sleep apnea    on CPAP   Thyroid carcinoma ([place])    Type II diabetes mellitus ([place])     Past Surgical History:  Procedure Laterality Date   ABDOMINAL HYSTERECTOMY     ANTERIOR CERVICAL DECOMP/DISCECTOMY FUSION     "I've had severa ORs; always went in from the front" (03/13/2013)   [place]     "several" (03/13/2013)   CARPAL TUNNEL RELEASE Right    CATARACT EXTRACTION W/ INTRAOCULAR LENS  IMPLANT, BILATERAL Bilateral    CHOLECYSTECTOMY     KNEE ARTHROSCOPY Left    SHOULDER ARTHROSCOPY W/ ROTATOR CUFF REPAIR Left    TONSILLECTOMY     TOTAL THYROIDECTOMY      Family History  Problem Relation Age of Onset   Coronary artery disease Sister        s/p coronary stenting   Stroke Sister 54   Diabetes Sister    Congestive Heart Failure Mother    Asthma Mother    Diabetes Mother    Congestive Heart Failure Father    Lung cancer Father    Asthma Father    Diabetes Father    Diabetes Brother     Social History   Socioeconomic History   Marital status: Single    Spouse name: Not on file   Number of children: 0   Years of education: college   Highest education level: Not on file  Occupational History   Occupation:  retired  Tobacco Use   Smoking status: Never   Smokeless tobacco: Never  Vaping Use   Vaping Use: Never used  Substance and Sexual Activity   Alcohol use: No    Alcohol/week: 0.0 standard drinks   Drug use: No   Sexual activity: Never  Other Topics Concern   Not on file  Social History Narrative   Lives alone   Right handed   Drinks no caffeine   Social Determinants of Health   Financial Resource Strain: Not on file  Food Insecurity: Not on file  Transportation Needs: Not on file  Physical Activity: Not on file  Stress: Not on file  Social Connections: Not on file  Intimate Partner Violence:  Not on file    Allergies  Allergen Reactions   Contrast Media [Iodinated Contrast Media] Anaphylaxis    01-10-15---PT GIVEN 13 HR PRE MEDS FOR CT--PT TOLERATED IV CONTRAST W/O ANY REACTION------[name] RT-R CT   Fluorescein Shortness Of Breath and Other (See Comments)    (Dye)   Iodine Anaphylaxis    Contrast dye - iodine   Molds & Smuts Shortness Of Breath and Other (See Comments)    Congestion and wheezing, also   Shellfish-Derived Products Anaphylaxis, Shortness Of Breath, Swelling and Other (See Comments)    Welts, also   Azithromycin Nausea Only   Codeine Hives and Nausea Only   Metformin Hcl Er Nausea Only   Tramadol Hcl Nausea Only   Other Rash and Other (See Comments)    Coban causes welts, also    Current Facility-Administered Medications  Medication Dose Route Frequency Provider Last Rate Last Admin   acetaminophen (TYLENOL) tablet 650 mg  650 mg Oral Q4H PRN Lenore Cordia, MD   650 mg at 09/18/21 3976   Or   acetaminophen (TYLENOL) 160 MG/5ML solution 650 mg  650 mg Per Tube Q4H PRN Lenore Cordia, MD       Or   acetaminophen (TYLENOL) suppository 650 mg  650 mg Rectal Q4H PRN Zada Finders R, MD       albuterol (PROVENTIL) (2.5 MG/3ML) 0.083% nebulizer solution 3 mL  3 mL Inhalation Q6H PRN Lenore Cordia, MD       aspirin EC tablet 325 mg  325 mg Oral Daily Rosalin Hawking, MD   325 mg at 09/18/21 7341   Chlorhexidine Gluconate Cloth 2 % PADS 6 each  6 each Topical Daily Dahal, Marlowe Aschoff, MD   6 each at 09/18/21 0916   clopidogrel (PLAVIX) tablet 75 mg  75 mg Oral Daily Rosalin Hawking, MD   75 mg at 09/18/21 0906   cycloSPORINE (RESTASIS) 0.05 % ophthalmic emulsion 1 drop  1 drop Both Eyes BID Zada Finders R, MD   1 drop at 09/18/21 0904   enoxaparin (LOVENOX) injection 40 mg  40 mg Subcutaneous Q24H Zada Finders R, MD   40 mg at 09/18/21 0904   ezetimibe (ZETIA) tablet 10 mg  10 mg Oral Daily Lenore Cordia, MD   10 mg at 09/18/21 0906   [START ON 09/19/2021]  spironolactone (ALDACTONE) tablet 25 mg  25 mg Oral Daily Reome, Earle J, RPH       And   [START ON 09/19/2021] hydrochlorothiazide (HYDRODIURIL) tablet 25 mg  25 mg Oral Daily Reome, Earle J, RPH       insulin aspart (novoLOG) injection 0-15 Units  0-15 Units Subcutaneous TID WC Lenore Cordia, MD   15 Units at 09/18/21 1122   insulin  aspart (novoLOG) injection 0-5 Units  0-5 Units Subcutaneous QHS Lenore Cordia, MD   5 Units at 09/17/21 2154   insulin aspart (novoLOG) injection 5 Units  5 Units Subcutaneous TID WC Dahal, Marlowe Aschoff, MD   5 Units at 09/18/21 1122   insulin glargine-yfgn (SEMGLEE) injection 25 Units  25 Units Subcutaneous Daily Dahal, Marlowe Aschoff, MD   25 Units at 09/18/21 1120   insulin pump   Subcutaneous TID WC, HS, 0200 Dahal, Binaya, MD       levothyroxine (SYNTHROID) tablet 150 mcg  150 mcg Oral QAC breakfast Zada Finders R, MD   150 mcg at 09/18/21 0549   montelukast (SINGULAIR) tablet 10 mg  10 mg Oral Chong Sicilian, Roxanne Mins R, MD   10 mg at 09/18/21 0643   ondansetron (ZOFRAN) injection 4 mg  4 mg Intravenous Q6H PRN Lenore Cordia, MD       predniSONE (DELTASONE) tablet 10 mg  10 mg Oral Chong Sicilian, Roxanne Mins R, MD   10 mg at 09/18/21 2841   rosuvastatin (CRESTOR) tablet 40 mg  40 mg Oral Daily Lenore Cordia, MD   40 mg at 09/18/21 3244   senna-docusate (Senokot-S) tablet 1 tablet  1 tablet Oral QHS PRN Lenore Cordia, MD   1 tablet at 09/18/21 1345   sodium chloride flush (NS) 0.9 % injection 10-40 mL  10-40 mL Intracatheter PRN Terrilee Croak, MD       topiramate (TOPAMAX) tablet 50 mg  50 mg Oral QHS Lenore Cordia, MD   50 mg at 09/17/21 2110    REVIEW OF SYSTEMS:  [X]  denotes positive finding, [ ]  denotes negative finding Cardiac  Comments:  Chest pain or chest pressure:    Shortness of breath upon exertion:    Short of breath when lying flat:    Irregular heart rhythm:        Vascular    Pain in calf, thigh, or hip brought on by ambulation:    Pain in feet at  night that wakes you up from your sleep:     Blood clot in your veins:    Leg swelling:         Pulmonary    Oxygen at home:    Productive cough:     Wheezing:         Neurologic    Sudden weakness in arms or legs:     Sudden numbness in arms or legs:     Sudden onset of difficulty speaking or slurred speech:    Temporary loss of vision in one eye:     Problems with dizziness:         Gastrointestinal    Blood in stool:     Vomited blood:         Genitourinary    Burning when urinating:     Blood in urine:        Psychiatric    Major depression:         Hematologic    Bleeding problems:    Problems with blood clotting too easily:        Skin    Rashes or ulcers:        Constitutional    Fever or chills:      PHYSICAL EXAM Vitals:   09/17/21 2337 09/18/21 0416 09/18/21 0725 09/18/21 1110  BP: (!) 143/76 (!) 153/74 (!) 160/93 (!) 152/77  Pulse: 88 79 88 91  Resp:  20 20 16   Temp:  98.3 F (36.8 C) 98.6 F (37 C) 98.1 F (36.7 C)  TempSrc:   Oral Oral  SpO2: 97% 99% 94% 97%  Weight:      Height:        Constitutional: chronically ill appearing. no distress. Appears well nourished.  Neurologic: CN intact.  4 out of 5 strength in the right upper and lower lower extremities.  Reports sensory loss over the right face. Psychiatric:  Mood and affect symmetric and appropriate. Eyes:  No icterus. No conjunctival pallor. Ears, nose, throat:  mucous membranes moist. Midline trachea.  Cardiac: regular rate and rhythm.  Respiratory:  unlabored. Abdominal:  soft, non-tender, non-distended.  Peripheral vascular: 2+ radial pulses Extremity: no edema. no cyanosis. no pallor.  Skin: no gangrene. no ulceration.  Lymphatic: no Stemmer's sign. no palpable lymphadenopathy.  PERTINENT LABORATORY AND RADIOLOGIC DATA  Most recent CBC CBC Latest Ref Rng & Units 09/18/2021 09/17/2021 09/16/2021  WBC 4.0 - 10.5 K/uL 5.5 4.2 4.3  Hemoglobin 12.0 - 15.0 g/dL 11.6(L) 10.8(L) 12.4   Hematocrit 36.0 - 46.0 % 35.2(L) 32.9(L) 37.9  Platelets 150 - 400 K/uL 209 192 224     Most recent CMP CMP Latest Ref Rng & Units 09/18/2021 09/17/2021 09/17/2021  Glucose 70 - 99 mg/dL 399(H) - 217(H)  BUN 8 - 23 mg/dL 13 - 9  Creatinine 0.44 - 1.00 mg/dL 1.24(H) - 1.06(H)  Sodium 135 - 145 mmol/L 137 - 135  Potassium 3.5 - 5.1 mmol/L 3.5 3.0(L) 2.7(LL)  Chloride 98 - 111 mmol/L 104 - 102  CO2 22 - 32 mmol/L 25 - 25  Calcium 8.9 - 10.3 mg/dL 9.3 - 8.8(L)  Total Protein 6.5 - 8.1 g/dL - - -  Total Bilirubin 0.3 - 1.2 mg/dL - - -  Alkaline Phos 38 - 126 U/L - - -  AST 15 - 41 U/L - - -  ALT 0 - 44 U/L - - -    Renal function Estimated Creatinine Clearance: 49 mL/min (A) (by C-G formula based on SCr of 1.24 mg/dL (H)).  Hgb A1c MFr Bld (%)  Date Value  09/17/2021 9.7 (H)    LDL Chol Calc (NIH)  Date Value Ref Range Status  05/29/2021 96 0 - 99 mg/dL Final   LDL Cholesterol  Date Value Ref Range Status  09/17/2021 120 (H) 0 - 99 mg/dL Final    Comment:           Total Cholesterol/HDL:CHD Risk Coronary Heart Disease Risk Table                     Men   Women  1/2 Average Risk   3.4   3.3  Average Risk       5.0   4.4  2 X Average Risk   9.6   7.1  3 X Average Risk  23.4   11.0        Use the calculated Patient Ratio above and the CHD Risk Table to determine the patient's CHD Risk.        ATP III CLASSIFICATION (LDL):  <100     mg/dL   Optimal  100-129  mg/dL   Near or Above                    Optimal  130-159  mg/dL   Borderline  160-189  mg/dL   High  >190     mg/dL   Very High Performed at [facility]  Lab, [address] [place], [place] [address]    LDL Direct  Date Value Ref Range Status  05/29/2021 96 0 - 99 mg/dL Final    MRI brain shows no evidence of acute stroke CT angiogram of the head neck shows severe bilateral carotid artery stenosis.  Right greater than left.  Both appear amenable to TCAR or carotid endarterectomy.  Yevonne Aline. Stanford Breed,  MD [facility] Phone Number: [phone] 09/18/2021 3:37 PM  Total time spent on preparing this encounter including chart review, data review, collecting history, examining the patient, coordinating care for this new patient, 60 minutes.  Portions of this report may have been transcribed using voice recognition software.  Every effort has been made to ensure accuracy; however, inadvertent computerized transcription errors may still be present.

## 2021-09-18 NOTE — Care Management Obs Status (Signed)
[place] NOTIFICATION   Patient Details  Name: [name] MRN: [id number] Date of Birth: Sep 27, 1950   Medicare Observation Status Notification Given:  Yes    Pollie Friar, RN 09/18/2021, 9:24 AM

## 2021-09-18 NOTE — Progress Notes (Addendum)
STROKE TEAM PROGRESS NOTE   SUBJECTIVE (INTERVAL HISTORY) No family is at the bedside.  Patient lying in bed, CTA head and neck done after 13-hour preparation, showed right ICA bulb 80 to 90% stenosis, left ICA bulb 60% stenosis with mixed plaque.  Discussed with Drs. Truddie Hidden and Hawkens, will plan for left carotid revascularization first and then elective right carotid revascularization.  OBJECTIVE Temp:  [97.7 F (36.5 C)-99.1 F (37.3 C)] 97.7 F (36.5 C) (01/26 1604) Pulse Rate:  [79-96] 96 (01/26 1604) Cardiac Rhythm: Normal sinus rhythm (01/26 0700) Resp:  [16-20] 18 (01/26 1604) BP: (136-160)/(72-93) 136/75 (01/26 1604) SpO2:  [93 %-99 %] 96 % (01/26 1604)  Recent Labs  Lab 09/17/21 2336 09/18/21 0615 09/18/21 0842 09/18/21 1109 09/18/21 1602  GLUCAP 466* 448* 460* 471* 403*   Recent Labs  Lab 09/16/21 1528 09/17/21 0404 09/17/21 0725 09/18/21 0409  NA 136 135  --  137  K 3.0* 2.7* 3.0* 3.5  CL 100 102  --  104  CO2 24 25  --  25  GLUCOSE 239* 217*  --  399*  BUN 14 9  --  13  CREATININE 1.10* 1.06*  --  1.24*  CALCIUM 9.6 8.8*  --  9.3  MG  --  1.4*  --   --    No results for input(s): AST, ALT, ALKPHOS, BILITOT, PROT, ALBUMIN in the last 168 hours. Recent Labs  Lab 09/16/21 1528 09/17/21 0404 09/18/21 0409  WBC 4.3 4.2 5.5  NEUTROABS  --   --  5.0  HGB 12.4 10.8* 11.6*  HCT 37.9 32.9* 35.2*  MCV 91.5 92.7 91.2  PLT 224 192 209   No results for input(s): CKTOTAL, CKMB, CKMBINDEX, TROPONINI in the last 168 hours. No results for input(s): LABPROT, INR in the last 72 hours. No results for input(s): COLORURINE, LABSPEC, [place], GLUCOSEU, HGBUR, BILIRUBINUR, KETONESUR, PROTEINUR, UROBILINOGEN, NITRITE, LEUKOCYTESUR in the last 72 hours.  Invalid input(s): APPERANCEUR     Component Value Date/Time   CHOL 197 09/17/2021 0404   CHOL 198 05/29/2021 0949   TRIG 149 09/17/2021 0404   HDL 47 09/17/2021 0404   HDL 72 05/29/2021 0949   CHOLHDL 4.2  09/17/2021 0404   VLDL 30 09/17/2021 0404   LDLCALC 120 (H) 09/17/2021 0404   LDLCALC 96 05/29/2021 0949   Lab Results  Component Value Date   HGBA1C 9.7 (H) 09/17/2021      Component Value Date/Time   LABOPIA NONE DETECTED 12/25/2019 0647   COCAINSCRNUR NONE DETECTED 12/25/2019 0647   LABBENZ NONE DETECTED 12/25/2019 0647   AMPHETMU NONE DETECTED 12/25/2019 0647   THCU NONE DETECTED 12/25/2019 0647   LABBARB NONE DETECTED 12/25/2019 0647    No results for input(s): ETH in the last 168 hours.  I have personally reviewed the radiological images below and agree with the radiology interpretations.  CT ANGIO HEAD NECK W WO CM  Result Date: 09/18/2021 CLINICAL DATA:  Stroke/TIA, determinate bulk source EXAM: CT ANGIOGRAPHY HEAD AND NECK TECHNIQUE: Multidetector CT imaging of the head and neck was performed using the standard protocol during bolus administration of intravenous contrast. Multiplanar CT image reconstructions and MIPs were obtained to evaluate the vascular anatomy. Carotid stenosis measurements (when applicable) are obtained utilizing NASCET criteria, using the distal internal carotid diameter as the denominator. RADIATION DOSE REDUCTION: This exam was performed according to the departmental dose-optimization program which includes automated exposure control, adjustment of the mA and/or kV according to patient size and/or use  of iterative reconstruction technique. CONTRAST:  39mL OMNIPAQUE IOHEXOL 350 MG/ML SOLN COMPARISON:  CT head 09/16/2021, MRA head and neck 09/17/2021 FINDINGS: CT HEAD FINDINGS Brain: There is no evidence of acute intracranial hemorrhage, extra-axial fluid collection, or acute infarct. Parenchymal volume is normal. The ventricles are normal in size. The parenchyma is normal in appearance. There is no mass lesion. There is no midline shift. Vascular: See below. Skull: Normal. Negative for fracture or focal lesion. Sinuses: There is mucosal thickening in the left  sphenoid sinus with surrounding hyperostosis consistent with chronic sinusitis. Orbits: Bilateral lens implants are in place. The globes and orbits are otherwise unremarkable. Review of the MIP images confirms the above findings CTA NECK FINDINGS Aortic arch: There is mild calcified atherosclerotic plaque of the aortic arch. The origins of the major branch vessels are patent. The subclavian arteries are patent. Right carotid system: The right common carotid artery is patent. There is predominantly soft plaque in the proximal right ICA resulting in approximally 80-90% stenosis. The distal right internal carotid artery is widely patent. Right external carotid artery is patent. There is no evidence of dissection or aneurysm. Left carotid system: The left common carotid artery is patent. There is mixed plaque at the left carotid bulb resulting in up to approximately 60% stenosis. The distal left internal carotid artery is patent. Left external carotid artery is patent. There is no dissection or aneurysm. Vertebral arteries: There is minimal plaque at the origin of the right vertebral artery without significant stenosis. The vertebral arteries are patent, without hemodynamically significant stenosis or occlusion. There is no dissection or aneurysm. Skeleton: The patient is status post C3-C4 ACDF. There is also osseous fusion of the C5 through C7 vertebral bodies there is adjacent segment disease at C2-C3 with probable moderate spinal canal stenosis. There is no visible canal hematoma. There is no acute osseous abnormality or aggressive osseous lesion. Other neck: The soft tissues are unremarkable. Upper chest: There is interlobular septal thickening and patchy ground-glass opacity in the lung apices. A right chest wall port is partially imaged. Review of the MIP images confirms the above findings CTA HEAD FINDINGS Anterior circulation: There is mild calcified atherosclerotic plaque in the bilateral intracranial ICAs  without hemodynamically significant stenosis. The bilateral MCAs are patent. The bilateral ACAs are patent. The anterior communicating artery is normal. There is no aneurysm or AVM. Posterior circulation: The bilateral V4 segments are patent. PICA is identified bilaterally. The basilar artery is patent. The bilateral PCAs are patent. Small posterior communicating arteries are identified. There is no aneurysm or AVM Venous sinuses: Patent. Anatomic variants: None. Review of the MIP images confirms the above findings IMPRESSION: 1. No acute intracranial pathology. 2. Predominantly soft plaque in the proximal right ICA resulting in approximately 80-90% stenosis. 3. Mixed plaque in the proximal left ICA results in approximately 60% stenosis. 4. Mild atherosclerotic plaque in the intracranial ICAs without hemodynamically significant stenosis. Otherwise, patent intracranial vasculature. 5. Interlobular septal thickening and patchy ground-glass opacities in the lung apices may reflect mild pulmonary interstitial edema. 6.  Aortic Atherosclerosis (ICD10-I70.0). Electronically Signed   By: Valetta Mole M.D.   On: 09/18/2021 09:17   DG Chest 2 View  Result Date: 09/16/2021 CLINICAL DATA:  Chest pain and short of breath. EXAM: CHEST - 2 VIEW COMPARISON:  12/19/2019 FINDINGS: Cardiac and mediastinal contours normal. Lungs are well aerated and clear. No infiltrate or effusion. Port-A-Cath tip in the SVC at the cavoatrial junction unchanged. IMPRESSION: No active cardiopulmonary disease.  Electronically Signed   By: Franchot Gallo M.D.   On: 09/16/2021 16:07   CT Head Wo Contrast  Result Date: 09/16/2021 CLINICAL DATA:  Neurological deficit EXAM: CT HEAD WITHOUT CONTRAST TECHNIQUE: Contiguous axial images were obtained from the base of the skull through the vertex without intravenous contrast. RADIATION DOSE REDUCTION: This exam was performed according to the departmental dose-optimization program which includes automated  exposure control, adjustment of the mA and/or kV according to patient size and/or use of iterative reconstruction technique. COMPARISON:  MR brain done on 11/09/2020 FINDINGS: Brain: No acute intracranial findings are seen. There are no signs of bleeding within the cranium. Cortical sulci are prominent. There is decreased density in the periventricular white matter. Vascular: Unremarkable. Skull: Unremarkable. Sinuses/Orbits: Mucosal thickening and mucous retention cysts are seen in the sphenoid and maxillary sinuses. Other: None IMPRESSION: No acute intracranial findings are seen.  Atrophy. Chronic sinusitis. Electronically Signed   By: Elmer Picker M.D.   On: 09/16/2021 17:14   MR ANGIO HEAD WO CONTRAST  Result Date: 09/17/2021 CLINICAL DATA:  Stroke follow-up. Right facial numbness and arm numbness EXAM: MRI HEAD WITHOUT CONTRAST MRA HEAD WITHOUT CONTRAST MRA NECK WITHOUT CONTRAST TECHNIQUE: Multiplanar, multiecho pulse sequences of the brain and surrounding structures were obtained without intravenous contrast. Angiographic images of the Circle of Willis were obtained using MRA technique without intravenous contrast. Angiographic images of the neck were obtained using MRA technique without intravenous contrast. Carotid stenosis measurements (when applicable) are obtained utilizing NASCET criteria, using the distal internal carotid diameter as the denominator. COMPARISON:  Head CT from yesterday.  Brain MRI 11/09/2020 FINDINGS: MRI HEAD FINDINGS Brain: No acute infarction, hemorrhage, hydrocephalus, extra-axial collection or mass lesion. Vascular: See below Skull and upper cervical spine: Normal marrow signal. C3-4 and C4-5 cervical spine fusion. Central protrusion and ligamentous thickening at C2-3 encroaching on the cord. No evident progression - less prominent cord impingement is likely from slice selection. Sinuses/Orbits: Bilateral cataract resection. Inspissated material in the left sphenoid  sinus, chronic. Retention cysts in the right maxillary sinus MRA HEAD FINDINGS No branch occlusion, beading, flow limiting stenosis, or aneurysm. Mild atheromatous irregularity to medium size branches. MRA NECK FINDINGS Antegrade flow in the carotid and vertebral arteries. Right vertebral artery is dominant. No evidence of proximal subclavian stenosis. The left vertebral artery appears to arise from the arch. Wasting of proximal right ICA lumen with 70% measured stenosis, although likely overestimated due to flow artifact. On the left, proximal ICA stenosis measures 60%. Irregularity of the ICA vessels at the skull base, likely artifact. No suspected flow limiting stenosis in the vertebral arteries. IMPRESSION: Brain MRI: No acute finding. Intracranial MRA: No emergent finding.  Mild atherosclerosis. Neck MRA: Evidence of flow reducing stenosis at the right more than left proximal ICA. Suggest CTA characterization given the limitations of noncontrast MRA. Electronically Signed   By: Jorje Guild M.D.   On: 09/17/2021 07:19   MR ANGIO NECK WO CONTRAST  Result Date: 09/17/2021 CLINICAL DATA:  Stroke follow-up. Right facial numbness and arm numbness EXAM: MRI HEAD WITHOUT CONTRAST MRA HEAD WITHOUT CONTRAST MRA NECK WITHOUT CONTRAST TECHNIQUE: Multiplanar, multiecho pulse sequences of the brain and surrounding structures were obtained without intravenous contrast. Angiographic images of the Circle of Willis were obtained using MRA technique without intravenous contrast. Angiographic images of the neck were obtained using MRA technique without intravenous contrast. Carotid stenosis measurements (when applicable) are obtained utilizing NASCET criteria, using the distal internal carotid diameter as the denominator.  COMPARISON:  Head CT from yesterday.  Brain MRI 11/09/2020 FINDINGS: MRI HEAD FINDINGS Brain: No acute infarction, hemorrhage, hydrocephalus, extra-axial collection or mass lesion. Vascular: See below  Skull and upper cervical spine: Normal marrow signal. C3-4 and C4-5 cervical spine fusion. Central protrusion and ligamentous thickening at C2-3 encroaching on the cord. No evident progression - less prominent cord impingement is likely from slice selection. Sinuses/Orbits: Bilateral cataract resection. Inspissated material in the left sphenoid sinus, chronic. Retention cysts in the right maxillary sinus MRA HEAD FINDINGS No branch occlusion, beading, flow limiting stenosis, or aneurysm. Mild atheromatous irregularity to medium size branches. MRA NECK FINDINGS Antegrade flow in the carotid and vertebral arteries. Right vertebral artery is dominant. No evidence of proximal subclavian stenosis. The left vertebral artery appears to arise from the arch. Wasting of proximal right ICA lumen with 70% measured stenosis, although likely overestimated due to flow artifact. On the left, proximal ICA stenosis measures 60%. Irregularity of the ICA vessels at the skull base, likely artifact. No suspected flow limiting stenosis in the vertebral arteries. IMPRESSION: Brain MRI: No acute finding. Intracranial MRA: No emergent finding.  Mild atherosclerosis. Neck MRA: Evidence of flow reducing stenosis at the right more than left proximal ICA. Suggest CTA characterization given the limitations of noncontrast MRA. Electronically Signed   By: Jorje Guild M.D.   On: 09/17/2021 07:19   MR BRAIN WO CONTRAST  Result Date: 09/17/2021 CLINICAL DATA:  Stroke follow-up. Right facial numbness and arm numbness EXAM: MRI HEAD WITHOUT CONTRAST MRA HEAD WITHOUT CONTRAST MRA NECK WITHOUT CONTRAST TECHNIQUE: Multiplanar, multiecho pulse sequences of the brain and surrounding structures were obtained without intravenous contrast. Angiographic images of the Circle of Willis were obtained using MRA technique without intravenous contrast. Angiographic images of the neck were obtained using MRA technique without intravenous contrast. Carotid  stenosis measurements (when applicable) are obtained utilizing NASCET criteria, using the distal internal carotid diameter as the denominator. COMPARISON:  Head CT from yesterday.  Brain MRI 11/09/2020 FINDINGS: MRI HEAD FINDINGS Brain: No acute infarction, hemorrhage, hydrocephalus, extra-axial collection or mass lesion. Vascular: See below Skull and upper cervical spine: Normal marrow signal. C3-4 and C4-5 cervical spine fusion. Central protrusion and ligamentous thickening at C2-3 encroaching on the cord. No evident progression - less prominent cord impingement is likely from slice selection. Sinuses/Orbits: Bilateral cataract resection. Inspissated material in the left sphenoid sinus, chronic. Retention cysts in the right maxillary sinus MRA HEAD FINDINGS No branch occlusion, beading, flow limiting stenosis, or aneurysm. Mild atheromatous irregularity to medium size branches. MRA NECK FINDINGS Antegrade flow in the carotid and vertebral arteries. Right vertebral artery is dominant. No evidence of proximal subclavian stenosis. The left vertebral artery appears to arise from the arch. Wasting of proximal right ICA lumen with 70% measured stenosis, although likely overestimated due to flow artifact. On the left, proximal ICA stenosis measures 60%. Irregularity of the ICA vessels at the skull base, likely artifact. No suspected flow limiting stenosis in the vertebral arteries. IMPRESSION: Brain MRI: No acute finding. Intracranial MRA: No emergent finding.  Mild atherosclerosis. Neck MRA: Evidence of flow reducing stenosis at the right more than left proximal ICA. Suggest CTA characterization given the limitations of noncontrast MRA. Electronically Signed   By: Jorje Guild M.D.   On: 09/17/2021 07:19   ECHOCARDIOGRAM COMPLETE  Result Date: 09/17/2021    ECHOCARDIOGRAM REPORT   Patient Name:   TIKI TUCCIARONE Date of Exam: 09/17/2021 Medical Rec #:  [id number]  Height:       68.0 in Accession #:     [id number]      Weight:       183.0 lb Date of Birth:  [birth year]        BSA:          1.968 m Patient Age:    66 years        BP:           145/60 mmHg Patient Gender: F               HR:           80 bpm. Exam Location:  Inpatient Procedure: 2D Echo Indications:    Stroke  History:        Patient has prior history of Echocardiogram examinations, most                 recent 12/20/2019. Signs/Symptoms:Murmur; Risk                 Factors:Hypertension and Diabetes.  Sonographer:    Arlyss Gandy Referring Phys: [id number] [place]  1. Left ventricular ejection fraction, by estimation, is 65 to 70%. The left ventricle has hyperdynamic function. The left ventricle has no regional wall motion abnormalities. There is mild left ventricular hypertrophy. Left ventricular diastolic parameters are consistent with Grade I diastolic dysfunction (impaired relaxation).  2. Right ventricular systolic function is normal. The right ventricular size is normal. Tricuspid regurgitation signal is inadequate for assessing PA pressure.  3. The mitral valve is normal in structure. No evidence of mitral valve regurgitation. No evidence of mitral stenosis. Moderate mitral annular calcification.  4. The aortic valve is tricuspid. Aortic valve regurgitation is not visualized. Aortic valve sclerosis/calcification is present, without any evidence of aortic stenosis.  5. The inferior vena cava is normal in size with greater than 50% respiratory variability, suggesting right atrial pressure of 3 mmHg. FINDINGS  Left Ventricle: Left ventricular ejection fraction, by estimation, is 65 to 70%. The left ventricle has hyperdynamic function. The left ventricle has no regional wall motion abnormalities. The left ventricular internal cavity size was normal in size. There is mild left ventricular hypertrophy. Left ventricular diastolic parameters are consistent with Grade I diastolic dysfunction (impaired relaxation). Right Ventricle: The  right ventricular size is normal. No increase in right ventricular wall thickness. Right ventricular systolic function is normal. Tricuspid regurgitation signal is inadequate for assessing PA pressure. Left Atrium: Left atrial size was normal in size. Right Atrium: Right atrial size was normal in size. Pericardium: Trivial pericardial effusion is present. Mitral Valve: The mitral valve is normal in structure. Moderate mitral annular calcification. No evidence of mitral valve regurgitation. No evidence of mitral valve stenosis. MV peak gradient, 7.1 mmHg. The mean mitral valve gradient is 3.0 mmHg. Tricuspid Valve: The tricuspid valve is normal in structure. Tricuspid valve regurgitation is not demonstrated. Aortic Valve: The aortic valve is tricuspid. Aortic valve regurgitation is not visualized. Aortic valve sclerosis/calcification is present, without any evidence of aortic stenosis. Aortic valve mean gradient measures 6.0 mmHg. Aortic valve peak gradient measures 11.2 mmHg. Aortic valve area, by VTI measures 2.60 cm. Pulmonic Valve: The pulmonic valve was normal in structure. Pulmonic valve regurgitation is not visualized. Aorta: The aortic root is normal in size and structure. Venous: The inferior vena cava is normal in size with greater than 50% respiratory variability, suggesting right atrial pressure of 3 mmHg. IAS/Shunts: No atrial level shunt detected by color  flow Doppler.  LEFT VENTRICLE PLAX 2D LVIDd:         2.90 cm   Diastology LVIDs:         1.80 cm   LV e' medial:    4.90 cm/s LV PW:         1.30 cm   LV E/e' medial:  17.1 LV IVS:        1.30 cm   LV e' lateral:   5.77 cm/s LVOT diam:     2.00 cm   LV E/e' lateral: 14.5 LV SV:         73 LV SV Index:   37 LVOT Area:     3.14 cm  RIGHT VENTRICLE RV Basal diam:  2.50 cm RV Mid diam:    2.30 cm RV S prime:     14.30 cm/s TAPSE (M-mode): 1.4 cm LEFT ATRIUM             Index        RIGHT ATRIUM          Index LA diam:        2.70 cm 1.37 cm/m   RA Area:      9.50 cm LA Vol (A2C):   25.3 ml 12.86 ml/m  RA Volume:   16.70 ml 8.49 ml/m LA Vol (A4C):   30.6 ml 15.55 ml/m LA Biplane Vol: 29.3 ml 14.89 ml/m  AORTIC VALVE AV Area (Vmax):    2.75 cm AV Area (Vmean):   2.69 cm AV Area (VTI):     2.60 cm AV Vmax:           167.00 cm/s AV Vmean:          110.000 cm/s AV VTI:            0.279 m AV Peak Grad:      11.2 mmHg AV Mean Grad:      6.0 mmHg LVOT Vmax:         146.00 cm/s LVOT Vmean:        94.300 cm/s LVOT VTI:          0.231 m LVOT/AV VTI ratio: 0.83  AORTA Ao Root diam: 3.00 cm Ao Asc diam:  3.10 cm MITRAL VALVE MV Area (PHT): 3.37 cm     SHUNTS MV Area VTI:   2.46 cm     Systemic VTI:  0.23 m MV Peak grad:  7.1 mmHg     Systemic Diam: 2.00 cm MV Mean grad:  3.0 mmHg MV Vmax:       1.33 m/s MV Vmean:      77.8 cm/s MV Decel Time: 225 msec MV E velocity: 83.90 cm/s MV A velocity: 114.00 cm/s MV E/A ratio:  0.74 Dalton McleanMD Electronically signed by Franki Monte Signature Date/Time: 09/17/2021/4:06:39 PM    Final      PHYSICAL EXAM  Temp:  [97.7 F (36.5 C)-99.1 F (37.3 C)] 97.7 F (36.5 C) (01/26 1604) Pulse Rate:  [79-96] 96 (01/26 1604) Resp:  [16-20] 18 (01/26 1604) BP: (136-160)/(72-93) 136/75 (01/26 1604) SpO2:  [93 %-99 %] 96 % (01/26 1604)  General - Well nourished, well developed, in no apparent distress. Lethargic   Ophthalmologic - fundi not visualized due to noncooperation.  Cardiovascular - Regular rhythm and rate.  Mental Status -  Level of arousal and orientation to time, place, and person were intact. Language including expression, naming, repetition, comprehension was assessed and found intact. Fund of Knowledge was assessed and was  intact.  Cranial Nerves II - XII - II - Visual field intact OU. III, IV, VI - Extraocular movements intact. Left mild ptosis. Subjective diplopia  V - Facial sensation decreased on the right VII - Facial movement intact bilaterally. VIII - Hearing & vestibular intact  bilaterally. X - Palate elevates symmetrically. XI - Chin turning & shoulder shrug intact bilaterally. XII - Tongue protrusion intact.  Motor Strength - The patients strength was normal in all LUE and LLE, however, RUE and RLE lack of effort on exam, RUE with action tremor, proximal 4/5 and distal 3/5 with finger grip. RLE 4+/5 proximal and distally.  Bulk was normal and fasciculations were absent.   Motor Tone - Muscle tone was assessed at the neck and appendages and was normal.  Reflexes - The patients reflexes were symmetrical in all extremities and she had no pathological reflexes.  Sensory - Light touch, temperature/pinprick were assessed and were decreased on the right UE and LE.    Coordination - The patient had normal movements in the hands with no ataxia or dysmetria although slow on the right.  Action and postural tremor on the right UE.  Gait and Station - deferred.   ASSESSMENT/PLAN [name] is a 71 y.o. female with history of hypertension, hyperlipidemia, diabetes, stroke, seronegative ocular myasthenia gravis, OSA on CPAP admitted for right-sided weakness and numbness and headache. No tPA given due to OOW.    TIA likely due to symptomatic left carotid stenosis Resultant still has lingering right sided weakness and numbness MRI no acute infarct MRA head unremarkable MRA neck flow reducing stenosis at right more than left proximal ICA CTA head and neck predominantly soft plaque in the proximal right ICA resulting in approximately 80-90% stenosis. Mixed plaque in the proximal left ICA results in approximately 60% stenosis. 2D Echo EF 65 to 70% LDL 120 HgbA1c 9.7 SCDs for VTE prophylaxis aspirin 81 mg daily prior to admission, now on aspirin 325 mg daily and clopidogrel 75 mg daily. Further antiplatelet regimen per vascular surgery. Patient counseled to be compliant with her antithrombotic medications Ongoing aggressive stroke risk factor management Therapy  recommendations: CIR Disposition: Pending  Carotid stenosis MRA neck flow reducing stenosis at right more than left proximal ICA CTA head and neck predominantly soft plaque in the proximal right ICA resulting in approximately 80-90% stenosis. Mixed plaque in the proximal left ICA results in approximately 60% stenosis. Vascular surgery Dr. Luan Pulling consulted Given likely symptomatic left ICA stenosis, plan for left carotid revascularization first and then elective right carotid revascularization later.  History of stroke 11/2019 admitted for left thalamic stroke.  MRA showed right MCA atherosclerosis.  Carotid Doppler right ICA > 70% stenosis, left ICA 50 to 69% stenosis.  EF 60 to 65%, DVT negative, LDL 101, A1c 9.1.  Discharged on DAPT and Crestor 40.  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 Please call with questions. Pt will follow up with stroke clinic  NP at [facility] in about 4 weeks. Thanks for the consult.   Rosalin Hawking, MD PhD Stroke Neurology 09/18/2021 4:44 PM    To contact Stroke Continuity provider, please refer to [email or web address] After hours, contact General Neurology

## 2021-09-18 NOTE — Plan of Care (Signed)
Insulin pump will be started tomorrow. Pt walked the hall with PT today. MD aware of BG. Pt walks to the BR. C/o HA today and controlled with prn Tylenol.   Problem: Education: Goal: Knowledge of General Education information will improve Description: Including pain rating scale, medication(s)/side effects and non-pharmacologic comfort measures Outcome: Progressing   Problem: Health Behavior/Discharge Planning: Goal: Ability to manage health-related needs will improve Outcome: Progressing   Problem: Clinical Measurements: Goal: Ability to maintain clinical measurements within normal limits will improve Outcome: Progressing Goal: Will remain free from infection Outcome: Progressing Goal: Diagnostic test results will improve Outcome: Progressing Goal: Respiratory complications will improve Outcome: Progressing Goal: Cardiovascular complication will be avoided Outcome: Progressing   Problem: Activity: Goal: Risk for activity intolerance will decrease Outcome: Progressing   Problem: Nutrition: Goal: Adequate nutrition will be maintained Outcome: Progressing   Problem: Coping: Goal: Level of anxiety will decrease Outcome: Progressing   Problem: Elimination: Goal: Will not experience complications related to bowel motility Outcome: Progressing Goal: Will not experience complications related to urinary retention Outcome: Progressing   Problem: Pain Managment: Goal: General experience of comfort will improve Outcome: Progressing   Problem: Safety: Goal: Ability to remain free from injury will improve Outcome: Progressing   Problem: Skin Integrity: Goal: Risk for impaired skin integrity will decrease Outcome: Progressing   Problem: Education: Goal: Knowledge of disease or condition will improve Outcome: Progressing Goal: Knowledge of secondary prevention will improve (SELECT ALL) Outcome: Progressing Goal: Knowledge of patient specific risk factors will improve  (INDIVIDUALIZE FOR PATIENT) Outcome: Progressing

## 2021-09-18 NOTE — Progress Notes (Signed)
CPAP set up and ready at bedside. Patient stated she can place herself on/off when ready. RT instructed patient to have RT called if she needs assistance. RT will monitor as needed.

## 2021-09-18 NOTE — Progress Notes (Signed)
PROGRESS NOTE  [name]  DOB: 04-Jul-1951  PCP: [name], DO ION:[id number]  DOA: 09/16/2021  LOS: 0 days  Hospital Day: 3  Chief Complaint  Patient presents with   Chest Pain   Brief narrative: [name] is a 71 y.o. 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  She felt similar to when she had previous stroke and hence came to the ED. Of note, patient recently had diarrhea.  Her neurologist at [facility] advised her to hold CellCept for 10 days after which diarrhea resolved.  In the ED, her blood pressure was elevated to 173/70 CT head was negative. Serum glucose was elevated to 239. SARS-CoV-2 and influenza PCR negative. Chest x-ray negative for focal consolidation, edema, effusion. CT head without contrast negative for acute intracranial findings. Admitted to hospitalist service Neurology consulted.  Subjective: Patient was seen and examined this morning.   Sitting up in chair.  Not in distress.  No new symptoms.   Blood sugar level running over 400.  Assessment/Plan: Right facial numbness History of left thalamic infarct with mild right-sided residual weakness -Admitted for stroke work-up.   -Neurology consult appreciated.  Also need to consider complex migraine versus psychogenic presentation.   -CT head and MRI brain unremarkable. -intracranial MRA did not show any emergent findings. -Neck MRA showed evidence of flow reducing stenosis of the right more than left proximal ICA.  Recommended CTA.  If significant, may need vascular surgery involvement. -A1c 9.1, LDL 120 -Prior to admission, patient was  not aspirin 81 mg daily, Zetia 10 mg daily, Crestor 40 mg daily.  Currently continue the same -Pending PT/OT/ST eval  Bilateral carotid artery stenosis. -CTA head and neck showed soft plaque in the proximal right ICA resulting in 80 to 90% stenosis, mixed plaque in the proximal left ICA resulting in 60% stenosis.  Mild atherosclerotic plaque in the intracranial ICAs without significant stenosis.  Otherwise patent intracranial vasculature. -May need vascular surgery consultation.   Myasthenia gravis -Follows with [facility].   -CellCept recently held due to diarrhea which has since resolved.   -Continue chronic prednisone 10 mg daily.  -She receives Rituxan and IVIG infusions.  Weekly through [facility].  Uncontrolled type 2 diabetes mellitus with hyperglycemia -A1c 9.7 on 09/17/2021 -Home meds include insulin pump and Jardiance.  However patient states he ran out of it while in the ED. -Currently patient's blood sugars running consistently over 400 because she is off her pump.  This morning, she was given Levemir 25 units.  Also on Premeal insulin, scheduled and sliding scale.  I have advised the patient to have her family member bring insulin pump from home so that we can initiate that while she is here in the hospital. Recent Labs  Lab 09/17/21 2129 09/17/21 2336 09/18/21 0615 09/18/21 0842 09/18/21 1109  GLUCAP 430* 466* 448* 460* 471*    Essential hypertension -Permissive hypertension allowed for now -Home meds include olmesartan 20 mg daily, Aldactone 25 mg daily, HCTZ 25 mg daily -Currently on hold for permissive hypertension.  Since there is no evidence of true stroke, I would resume her Aldactone/HCTZ today.  Keep olmesartan on hold.   -Continue monitor blood  pressure.   Hypothyroidism Continue Synthroid.   Hyperlipidemia Continue rosuvastatin and Zetia.   OSA Continue CPAP nightly.   Hypokalemia/hypomagnesemia -Oral and IV replacement ordered. -Continue to  monitor. Recent Labs  Lab 09/16/21 1528 09/17/21 0404 09/17/21 0725 09/18/21 0409  K 3.0* 2.7* 3.0* 3.5  MG  --  1.4*  --   --     Mobility: Encourage ambulation Living condition: Lives at home Goals of care:   Code Status: Full Code  Nutritional status: Body mass index is 27.83 kg/m.      Diet:  Diet Order             Diet heart healthy/carb modified Room service appropriate? Yes; Fluid consistency: Thin  Diet effective now                  DVT prophylaxis:  enoxaparin (LOVENOX) injection 40 mg Start: 09/17/21 0800   Antimicrobials: None Fluid: None Consultants: Neurology Family Communication: None at bedside  Status is: Observation  Continue in-hospital care because: Further stroke work-up.  May need vascular intervention Level of care: Telemetry Medical   Dispo: The patient is from: Home              Anticipated d/c is to: Home likely              Patient currently is not medically stable to d/c.   Difficult to place patient No     Infusions:    Scheduled Meds:  aspirin EC  325 mg Oral Daily   Chlorhexidine Gluconate Cloth  6 each Topical Daily   clopidogrel  75 mg Oral Daily   cycloSPORINE  1 drop Both Eyes BID   enoxaparin (LOVENOX) injection  40 mg Subcutaneous Q24H   ezetimibe  10 mg Oral Daily   insulin aspart  0-15 Units Subcutaneous TID WC   insulin aspart  0-5 Units Subcutaneous QHS   insulin aspart  5 Units Subcutaneous TID WC   insulin glargine-yfgn  25 Units Subcutaneous Daily   levothyroxine  150 mcg Oral QAC breakfast   montelukast  10 mg Oral BH-q7a   predniSONE  10 mg Oral BH-q7a   rosuvastatin  40 mg Oral Daily   topiramate  50 mg Oral QHS    PRN meds: acetaminophen **OR** acetaminophen (TYLENOL) oral liquid 160 mg/5 mL **OR** acetaminophen, albuterol, ondansetron (ZOFRAN) IV, senna-docusate, sodium chloride flush   Antimicrobials: Anti-infectives (From admission, onward)    None       Objective: Vitals:    09/18/21 0725 09/18/21 1110  BP: (!) 160/93 (!) 152/77  Pulse: 88 91  Resp: 20 16  Temp: 98.6 F (37 C) 98.1 F (36.7 C)  SpO2: 94% 97%    Intake/Output Summary (Last 24 hours) at 09/18/2021 1126 Last data filed at 09/18/2021 0900 Gross per 24 hour  Intake 610 ml  Output --  Net 610 ml    Filed Weights   09/16/21 1525  Weight: 83 kg   Weight change:  Body mass index is 27.83 kg/m.   Physical Exam: General exam: Pleasant elderly African-American female.  Not in physical distress Skin: No rashes, lesions or ulcers. HEENT: Atraumatic, normocephalic, no obvious bleeding Lungs: Clear to auscultation bilaterally CVS: Regular rate and rhythm, no murmur GI/Abd soft, nontender, nondistended, bowel sound present CNS: Alert, awake, oriented x3 Psychiatry: Mood appropriate Extremities: No pedal edema, no calf tenderness  Data Review: I have personally reviewed the laboratory data and studies available.  F/u labs ordered [employer] (  From admission, onward)    None       Signed, Terrilee Croak, MD [facility] 09/18/2021

## 2021-09-18 NOTE — Plan of Care (Signed)
  Problem: Education: Goal: Knowledge of General Education information will improve Description Including pain rating scale, medication(s)/side effects and non-pharmacologic comfort measures Outcome: Progressing   

## 2021-09-18 NOTE — Progress Notes (Signed)
Notified by RN that pt had BS of 405 earlier and was given 5 units of novolog.  Pt was eating just before that blood glucose check Blood glucose checked an 1 1/2-2 hr later and BS is now 466.   Given 12 units of novolog now and will recheck blood glucose in 1 1/2 hour

## 2021-09-18 NOTE — Progress Notes (Deleted)
Primary Physician/Referring:  [name], DO  Patient ID: [name], female    DOB: 06-22-1951, 71 y.o.   MRN: [id number]  No chief complaint on file.  HPI:    [name]  is a 71 y.o. African-American female  with  asymptomatic carotid stenosis, uncontrolled DM with stage 3 CKD and diabetic retinopathy,, hypertension, hyperlipidemia, dyspnea on exertion,  thyroid nodules, follows Dr. 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  I seen her 2 months ago for questionable weight gain and fluctuation in blood pressure including low blood pressure.  This was initiated by insurance home nursing.  However on exam she was normal with no hypertension, blood pressure was well controlled.  Today she returns to discuss carotid duplex.  Dyspnea is very mild and stable, no PND or orthopnea, no leg edema.  She has been gradually gaining weight, over the past 3 months she has gained about 10 pounds in weight.  No symptoms to suggest TIA.  States that her weight has been relatively stable.  Past Medical History:  Diagnosis Date   Arthritis    "all over my body" (03/13/2013)   Asthma    Asymptomatic carotid artery stenosis, bilateral 10/08/2018   GERD (gastroesophageal reflux disease)    H/O hiatal hernia    Heart murmur    Hypercholesterolemia 10/08/2018   Hypertension    Hypothyroidism    Myasthenia gravis ([place])    "in my eyes; diagnsosed > 7 yr ago" (03/13/2013)   Sleep apnea    on CPAP   Thyroid carcinoma ([place])    Type II diabetes mellitus ([place])    Past Surgical History:  Procedure Laterality Date   ABDOMINAL HYSTERECTOMY     ANTERIOR CERVICAL DECOMP/DISCECTOMY FUSION     "I've had severa ORs; always went in from the front" (03/13/2013)   [place]     "several" (03/13/2013)    CARPAL TUNNEL RELEASE Right    CATARACT EXTRACTION W/ INTRAOCULAR LENS  IMPLANT, BILATERAL Bilateral    CHOLECYSTECTOMY     KNEE ARTHROSCOPY Left    SHOULDER ARTHROSCOPY W/ ROTATOR CUFF REPAIR Left    TONSILLECTOMY     TOTAL THYROIDECTOMY     Social History   Socioeconomic History   Marital status: Single    Spouse name: Not on file   Number of children: 0   Years of education: college   Highest education level: Not on file  Occupational History   Occupation: retired  Tobacco Use   Smoking status: Never   Smokeless tobacco: Never  Vaping Use   Vaping Use: Never used  Substance and Sexual Activity   Alcohol use: No    Alcohol/week: 0.0 standard drinks   Drug use: No   Sexual activity: Never  Other Topics Concern   Not on file  Social History Narrative   Lives alone   Right handed   Drinks no caffeine   Social Determinants of Health   Financial Resource Strain: Not on file  Food Insecurity: Not on file  Transportation Needs: Not on file  Physical Activity: Not on file  Stress: Not on file  Social Connections: Not on file  Intimate Partner Violence: Not on file   ROS  Review of Systems  Cardiovascular:  Positive for dyspnea on exertion (Chronic and stable). Negative for chest pain  and leg swelling.  Musculoskeletal:  Positive for back pain and muscle weakness.  Gastrointestinal:  Negative for melena.  Neurological:  Positive for focal weakness (right arm and leg), loss of balance and numbness (right arm).  Objective  There were no vitals taken for this visit. There is no height or weight on file to calculate BMI.  Vitals with BMI 09/18/2021 09/17/2021 09/17/2021  Height - - -  Weight - - -  BMI - - -  Systolic 194 174 081  Diastolic 74 76 72  Pulse 79 88 85    No data found.  Physical Exam Constitutional:      General: She is not in acute distress.    Appearance: She is well-developed. She is obese.     Comments: Mildly obese  Neck:     Thyroid: No  thyromegaly.     Vascular: Carotid bruit (right carotid bruit) present. No JVD.  Cardiovascular:     Rate and Rhythm: Normal rate and regular rhythm.     Pulses:          Radial pulses are 2+ on the right side and 2+ on the left side.       Femoral pulses are 2+ on the right side.      Popliteal pulses are 0 on the right side and 0 on the left side.       Dorsalis pedis pulses are 1+ on the right side and 1+ on the left side.       Posterior tibial pulses are 0 on the right side and 0 on the left side.     Heart sounds: Normal heart sounds. No murmur heard.   No gallop.     Comments: popliteal pulse difficult to feel due to patient's body habitus.  Pulmonary:     Effort: Pulmonary effort is normal.     Breath sounds: Normal breath sounds.  Abdominal:     General: Bowel sounds are normal.     Palpations: Abdomen is soft.  Musculoskeletal:     Cervical back: Neck supple.     Right lower leg: No edema.     Left lower leg: No edema.   Radiology: DG Chest 2 View  Result Date: 09/16/2021 CLINICAL DATA:  Chest pain and short of breath. EXAM: CHEST - 2 VIEW COMPARISON:  12/19/2019 FINDINGS: Cardiac and mediastinal contours normal. Lungs are well aerated and clear. No infiltrate or effusion. Port-A-Cath tip in the SVC at the cavoatrial junction unchanged. IMPRESSION: No active cardiopulmonary disease. Electronically Signed   By: Franchot Gallo M.D.   On: 09/16/2021 16:07   CT Head Wo Contrast  Result Date: 09/16/2021 CLINICAL DATA:  Neurological deficit EXAM: CT HEAD WITHOUT CONTRAST TECHNIQUE: Contiguous axial images were obtained from the base of the skull through the vertex without intravenous contrast. RADIATION DOSE REDUCTION: This exam was performed according to the departmental dose-optimization program which includes automated exposure control, adjustment of the mA and/or kV according to patient size and/or use of iterative reconstruction technique. COMPARISON:  MR brain done on  11/09/2020 FINDINGS: Brain: No acute intracranial findings are seen. There are no signs of bleeding within the cranium. Cortical sulci are prominent. There is decreased density in the periventricular white matter. Vascular: Unremarkable. Skull: Unremarkable. Sinuses/Orbits: Mucosal thickening and mucous retention cysts are seen in the sphenoid and maxillary sinuses. Other: None IMPRESSION: No acute intracranial findings are seen.  Atrophy. Chronic sinusitis. Electronically Signed   By: Elmer Picker M.D.   On:  09/16/2021 17:14   MR ANGIO HEAD WO CONTRAST  Result Date: 09/17/2021 CLINICAL DATA:  Stroke follow-up. Right facial numbness and arm numbness EXAM: MRI HEAD WITHOUT CONTRAST MRA HEAD WITHOUT CONTRAST MRA NECK WITHOUT CONTRAST TECHNIQUE: Multiplanar, multiecho pulse sequences of the brain and surrounding structures were obtained without intravenous contrast. Angiographic images of the Circle of Willis were obtained using MRA technique without intravenous contrast. Angiographic images of the neck were obtained using MRA technique without intravenous contrast. Carotid stenosis measurements (when applicable) are obtained utilizing NASCET criteria, using the distal internal carotid diameter as the denominator. COMPARISON:  Head CT from yesterday.  Brain MRI 11/09/2020 FINDINGS: MRI HEAD FINDINGS Brain: No acute infarction, hemorrhage, hydrocephalus, extra-axial collection or mass lesion. Vascular: See below Skull and upper cervical spine: Normal marrow signal. C3-4 and C4-5 cervical spine fusion. Central protrusion and ligamentous thickening at C2-3 encroaching on the cord. No evident progression - less prominent cord impingement is likely from slice selection. Sinuses/Orbits: Bilateral cataract resection. Inspissated material in the left sphenoid sinus, chronic. Retention cysts in the right maxillary sinus MRA HEAD FINDINGS No branch occlusion, beading, flow limiting stenosis, or aneurysm. Mild  atheromatous irregularity to medium size branches. MRA NECK FINDINGS Antegrade flow in the carotid and vertebral arteries. Right vertebral artery is dominant. No evidence of proximal subclavian stenosis. The left vertebral artery appears to arise from the arch. Wasting of proximal right ICA lumen with 70% measured stenosis, although likely overestimated due to flow artifact. On the left, proximal ICA stenosis measures 60%. Irregularity of the ICA vessels at the skull base, likely artifact. No suspected flow limiting stenosis in the vertebral arteries. IMPRESSION: Brain MRI: No acute finding. Intracranial MRA: No emergent finding.  Mild atherosclerosis. Neck MRA: Evidence of flow reducing stenosis at the right more than left proximal ICA. Suggest CTA characterization given the limitations of noncontrast MRA. Electronically Signed   By: Jorje Guild M.D.   On: 09/17/2021 07:19   MR ANGIO NECK WO CONTRAST  Result Date: 09/17/2021 CLINICAL DATA:  Stroke follow-up. Right facial numbness and arm numbness EXAM: MRI HEAD WITHOUT CONTRAST MRA HEAD WITHOUT CONTRAST MRA NECK WITHOUT CONTRAST TECHNIQUE: Multiplanar, multiecho pulse sequences of the brain and surrounding structures were obtained without intravenous contrast. Angiographic images of the Circle of Willis were obtained using MRA technique without intravenous contrast. Angiographic images of the neck were obtained using MRA technique without intravenous contrast. Carotid stenosis measurements (when applicable) are obtained utilizing NASCET criteria, using the distal internal carotid diameter as the denominator. COMPARISON:  Head CT from yesterday.  Brain MRI 11/09/2020 FINDINGS: MRI HEAD FINDINGS Brain: No acute infarction, hemorrhage, hydrocephalus, extra-axial collection or mass lesion. Vascular: See below Skull and upper cervical spine: Normal marrow signal. C3-4 and C4-5 cervical spine fusion. Central protrusion and ligamentous thickening at C2-3  encroaching on the cord. No evident progression - less prominent cord impingement is likely from slice selection. Sinuses/Orbits: Bilateral cataract resection. Inspissated material in the left sphenoid sinus, chronic. Retention cysts in the right maxillary sinus MRA HEAD FINDINGS No branch occlusion, beading, flow limiting stenosis, or aneurysm. Mild atheromatous irregularity to medium size branches. MRA NECK FINDINGS Antegrade flow in the carotid and vertebral arteries. Right vertebral artery is dominant. No evidence of proximal subclavian stenosis. The left vertebral artery appears to arise from the arch. Wasting of proximal right ICA lumen with 70% measured stenosis, although likely overestimated due to flow artifact. On the left, proximal ICA stenosis measures 60%. Irregularity of the ICA vessels at  the skull base, likely artifact. No suspected flow limiting stenosis in the vertebral arteries. IMPRESSION: Brain MRI: No acute finding. Intracranial MRA: No emergent finding.  Mild atherosclerosis. Neck MRA: Evidence of flow reducing stenosis at the right more than left proximal ICA. Suggest CTA characterization given the limitations of noncontrast MRA. Electronically Signed   By: Jorje Guild M.D.   On: 09/17/2021 07:19   MR BRAIN WO CONTRAST  Result Date: 09/17/2021 CLINICAL DATA:  Stroke follow-up. Right facial numbness and arm numbness EXAM: MRI HEAD WITHOUT CONTRAST MRA HEAD WITHOUT CONTRAST MRA NECK WITHOUT CONTRAST TECHNIQUE: Multiplanar, multiecho pulse sequences of the brain and surrounding structures were obtained without intravenous contrast. Angiographic images of the Circle of Willis were obtained using MRA technique without intravenous contrast. Angiographic images of the neck were obtained using MRA technique without intravenous contrast. Carotid stenosis measurements (when applicable) are obtained utilizing NASCET criteria, using the distal internal carotid diameter as the denominator.  COMPARISON:  Head CT from yesterday.  Brain MRI 11/09/2020 FINDINGS: MRI HEAD FINDINGS Brain: No acute infarction, hemorrhage, hydrocephalus, extra-axial collection or mass lesion. Vascular: See below Skull and upper cervical spine: Normal marrow signal. C3-4 and C4-5 cervical spine fusion. Central protrusion and ligamentous thickening at C2-3 encroaching on the cord. No evident progression - less prominent cord impingement is likely from slice selection. Sinuses/Orbits: Bilateral cataract resection. Inspissated material in the left sphenoid sinus, chronic. Retention cysts in the right maxillary sinus MRA HEAD FINDINGS No branch occlusion, beading, flow limiting stenosis, or aneurysm. Mild atheromatous irregularity to medium size branches. MRA NECK FINDINGS Antegrade flow in the carotid and vertebral arteries. Right vertebral artery is dominant. No evidence of proximal subclavian stenosis. The left vertebral artery appears to arise from the arch. Wasting of proximal right ICA lumen with 70% measured stenosis, although likely overestimated due to flow artifact. On the left, proximal ICA stenosis measures 60%. Irregularity of the ICA vessels at the skull base, likely artifact. No suspected flow limiting stenosis in the vertebral arteries. IMPRESSION: Brain MRI: No acute finding. Intracranial MRA: No emergent finding.  Mild atherosclerosis. Neck MRA: Evidence of flow reducing stenosis at the right more than left proximal ICA. Suggest CTA characterization given the limitations of noncontrast MRA. Electronically Signed   By: Jorje Guild M.D.   On: 09/17/2021 07:19   ECHOCARDIOGRAM COMPLETE  Result Date: 09/17/2021    ECHOCARDIOGRAM REPORT   Patient Name:   [name] Date of Exam: 09/17/2021 Medical Rec #:  [id number]       Height:       68.0 in Accession #:    [id number]      Weight:       183.0 lb Date of Birth:  [birth year]        BSA:          1.968 m Patient Age:    31 years        BP:           145/60 mmHg  Patient Gender: F               HR:           80 bpm. Exam Location:  Inpatient Procedure: 2D Echo Indications:    Stroke  History:        Patient has prior history of Echocardiogram examinations, most                 recent 12/20/2019. Signs/Symptoms:Murmur; Risk  Factors:Hypertension and Diabetes.  Sonographer:    Arlyss Gandy Referring Phys: [id number] [place]  1. Left ventricular ejection fraction, by estimation, is 65 to 70%. The left ventricle has hyperdynamic function. The left ventricle has no regional wall motion abnormalities. There is mild left ventricular hypertrophy. Left ventricular diastolic parameters are consistent with Grade I diastolic dysfunction (impaired relaxation).  2. Right ventricular systolic function is normal. The right ventricular size is normal. Tricuspid regurgitation signal is inadequate for assessing PA pressure.  3. The mitral valve is normal in structure. No evidence of mitral valve regurgitation. No evidence of mitral stenosis. Moderate mitral annular calcification.  4. The aortic valve is tricuspid. Aortic valve regurgitation is not visualized. Aortic valve sclerosis/calcification is present, without any evidence of aortic stenosis.  5. The inferior vena cava is normal in size with greater than 50% respiratory variability, suggesting right atrial pressure of 3 mmHg. FINDINGS  Left Ventricle: Left ventricular ejection fraction, by estimation, is 65 to 70%. The left ventricle has hyperdynamic function. The left ventricle has no regional wall motion abnormalities. The left ventricular internal cavity size was normal in size. There is mild left ventricular hypertrophy. Left ventricular diastolic parameters are consistent with Grade I diastolic dysfunction (impaired relaxation). Right Ventricle: The right ventricular size is normal. No increase in right ventricular wall thickness. Right ventricular systolic function is normal. Tricuspid regurgitation  signal is inadequate for assessing PA pressure. Left Atrium: Left atrial size was normal in size. Right Atrium: Right atrial size was normal in size. Pericardium: Trivial pericardial effusion is present. Mitral Valve: The mitral valve is normal in structure. Moderate mitral annular calcification. No evidence of mitral valve regurgitation. No evidence of mitral valve stenosis. MV peak gradient, 7.1 mmHg. The mean mitral valve gradient is 3.0 mmHg. Tricuspid Valve: The tricuspid valve is normal in structure. Tricuspid valve regurgitation is not demonstrated. Aortic Valve: The aortic valve is tricuspid. Aortic valve regurgitation is not visualized. Aortic valve sclerosis/calcification is present, without any evidence of aortic stenosis. Aortic valve mean gradient measures 6.0 mmHg. Aortic valve peak gradient measures 11.2 mmHg. Aortic valve area, by VTI measures 2.60 cm. Pulmonic Valve: The pulmonic valve was normal in structure. Pulmonic valve regurgitation is not visualized. Aorta: The aortic root is normal in size and structure. Venous: The inferior vena cava is normal in size with greater than 50% respiratory variability, suggesting right atrial pressure of 3 mmHg. IAS/Shunts: No atrial level shunt detected by color flow Doppler.  LEFT VENTRICLE PLAX 2D LVIDd:         2.90 cm   Diastology LVIDs:         1.80 cm   LV e' medial:    4.90 cm/s LV PW:         1.30 cm   LV E/e' medial:  17.1 LV IVS:        1.30 cm   LV e' lateral:   5.77 cm/s LVOT diam:     2.00 cm   LV E/e' lateral: 14.5 LV SV:         73 LV SV Index:   37 LVOT Area:     3.14 cm  RIGHT VENTRICLE RV Basal diam:  2.50 cm RV Mid diam:    2.30 cm RV S prime:     14.30 cm/s TAPSE (M-mode): 1.4 cm LEFT ATRIUM             Index        RIGHT ATRIUM  Index LA diam:        2.70 cm 1.37 cm/m   RA Area:     9.50 cm LA Vol (A2C):   25.3 ml 12.86 ml/m  RA Volume:   16.70 ml 8.49 ml/m LA Vol (A4C):   30.6 ml 15.55 ml/m LA Biplane Vol: 29.3 ml 14.89  ml/m  AORTIC VALVE AV Area (Vmax):    2.75 cm AV Area (Vmean):   2.69 cm AV Area (VTI):     2.60 cm AV Vmax:           167.00 cm/s AV Vmean:          110.000 cm/s AV VTI:            0.279 m AV Peak Grad:      11.2 mmHg AV Mean Grad:      6.0 mmHg LVOT Vmax:         146.00 cm/s LVOT Vmean:        94.300 cm/s LVOT VTI:          0.231 m LVOT/AV VTI ratio: 0.83  AORTA Ao Root diam: 3.00 cm Ao Asc diam:  3.10 cm MITRAL VALVE MV Area (PHT): 3.37 cm     SHUNTS MV Area VTI:   2.46 cm     Systemic VTI:  0.23 m MV Peak grad:  7.1 mmHg     Systemic Diam: 2.00 cm MV Mean grad:  3.0 mmHg MV Vmax:       1.33 m/s MV Vmean:      77.8 cm/s MV Decel Time: 225 msec MV E velocity: 83.90 cm/s MV A velocity: 114.00 cm/s MV E/A ratio:  0.74 Dalton McleanMD Electronically signed by Franki Monte Signature Date/Time: 09/17/2021/4:06:39 PM    Final     Laboratory examination:   Recent Labs    09/16/21 1528 09/17/21 0404 09/17/21 0725 09/18/21 0409  NA 136 135  --  137  K 3.0* 2.7* 3.0* 3.5  CL 100 102  --  104  CO2 24 25  --  25  GLUCOSE 239* 217*  --  399*  BUN 14 9  --  13  CREATININE 1.10* 1.06*  --  1.24*  CALCIUM 9.6 8.8*  --  9.3  GFRNONAA 55* 57*  --  47*   CMP Latest Ref Rng & Units 09/18/2021 09/17/2021 09/17/2021  Glucose 70 - 99 mg/dL 399(H) - 217(H)  BUN 8 - 23 mg/dL 13 - 9  Creatinine 0.44 - 1.00 mg/dL 1.24(H) - 1.06(H)  Sodium 135 - 145 mmol/L 137 - 135  Potassium 3.5 - 5.1 mmol/L 3.5 3.0(L) 2.7(LL)  Chloride 98 - 111 mmol/L 104 - 102  CO2 22 - 32 mmol/L 25 - 25  Calcium 8.9 - 10.3 mg/dL 9.3 - 8.8(L)  Total Protein 6.5 - 8.1 g/dL - - -  Total Bilirubin 0.3 - 1.2 mg/dL - - -  Alkaline Phos 38 - 126 U/L - - -  AST 15 - 41 U/L - - -  ALT 0 - 44 U/L - - -   CBC Latest Ref Rng & Units 09/18/2021 09/17/2021 09/16/2021  WBC 4.0 - 10.5 K/uL 5.5 4.2 4.3  Hemoglobin 12.0 - 15.0 g/dL 11.6(L) 10.8(L) 12.4  Hematocrit 36.0 - 46.0 % 35.2(L) 32.9(L) 37.9  Platelets 150 - 400 K/uL 209 192 224   Lipid  Panel Recent Labs    01/01/21 1202 03/14/21 0821 05/29/21 0949 09/17/21 0404  CHOL 234* 161 198 197  TRIG 157* 118  179* 149  LDLCALC 133* 91 96 120*  VLDL  --   --   --  30  HDL 74 49 72 47  CHOLHDL  --   --   --  4.2  LDLDIRECT 140*  --  96  --      Lipid Panel     Component Value Date/Time   CHOL 197 09/17/2021 0404   CHOL 198 05/29/2021 0949   TRIG 149 09/17/2021 0404   HDL 47 09/17/2021 0404   HDL 72 05/29/2021 0949   CHOLHDL 4.2 09/17/2021 0404   VLDL 30 09/17/2021 0404   LDLCALC 120 (H) 09/17/2021 0404   LDLCALC 96 05/29/2021 0949   LDLDIRECT 96 05/29/2021 0949   HEMOGLOBIN A1C Lab Results  Component Value Date   HGBA1C 9.7 (H) 09/17/2021   MPG 232 09/17/2021   BNP (last 3 results) Recent Labs    05/26/21 1041  BNP 7.5    ProBNP (last 3 results) No results for input(s): PROBNP in the last 8760 hours.  External labs:  Labs 12/26/2020:  Hb 10.3/HCT 32.4, platelets 209, normal indicis.  Sodium 135, potassium 3.8, BUN 34, creatinine 1.1, EGFR 55 mL.  Serum glucose 327 mg.  CMP otherwise normal.  TSH normal at 0.33.  A1c 9.0%.  Vitamin D 36.1.  Labs 02/05/2020:  Total cholesterol 272, triglycerides 203, HDL 77, LDL 159.  Medications   Current Facility-Administered Medications on File Prior to Visit  Medication Dose Route Frequency Provider Last Rate Last Admin   acetaminophen (TYLENOL) tablet 650 mg  650 mg Oral Q4H PRN Lenore Cordia, MD       Or   acetaminophen (TYLENOL) 160 MG/5ML solution 650 mg  650 mg Per Tube Q4H PRN Lenore Cordia, MD       Or   acetaminophen (TYLENOL) suppository 650 mg  650 mg Rectal Q4H PRN Zada Finders R, MD       albuterol (PROVENTIL) (2.5 MG/3ML) 0.083% nebulizer solution 3 mL  3 mL Inhalation Q6H PRN Lenore Cordia, MD       aspirin EC tablet 325 mg  325 mg Oral Daily Rosalin Hawking, MD       clopidogrel (PLAVIX) tablet 75 mg  75 mg Oral Daily Rosalin Hawking, MD   75 mg at 09/17/21 0840   cycloSPORINE (RESTASIS)  0.05 % ophthalmic emulsion 1 drop  1 drop Both Eyes BID Zada Finders R, MD   1 drop at 09/17/21 2111   enoxaparin (LOVENOX) injection 40 mg  40 mg Subcutaneous Q24H Zada Finders R, MD   40 mg at 09/17/21 0829   ezetimibe (ZETIA) tablet 10 mg  10 mg Oral Daily Zada Finders R, MD   10 mg at 09/17/21 0831   insulin aspart (novoLOG) injection 0-15 Units  0-15 Units Subcutaneous TID WC Lenore Cordia, MD   15 Units at 09/17/21 1549   insulin aspart (novoLOG) injection 0-5 Units  0-5 Units Subcutaneous QHS Lenore Cordia, MD   5 Units at 09/17/21 2154   insulin glargine-yfgn (SEMGLEE) injection 15 Units  15 Units Subcutaneous Daily Dahal, Marlowe Aschoff, MD   15 Units at 09/17/21 1739   levothyroxine (SYNTHROID) tablet 150 mcg  150 mcg Oral QAC breakfast Zada Finders R, MD   150 mcg at 09/18/21 0549   montelukast (SINGULAIR) tablet 10 mg  10 mg Oral Chong Sicilian, Roxanne Mins R, MD   10 mg at 09/18/21 0643   ondansetron (ZOFRAN) injection 4 mg  4 mg Intravenous  Q6H PRN Lenore Cordia, MD       predniSONE (DELTASONE) tablet 10 mg  10 mg Oral Chong Sicilian, Roxanne Mins R, MD   10 mg at 09/18/21 0643   rosuvastatin (CRESTOR) tablet 40 mg  40 mg Oral Daily Zada Finders R, MD   40 mg at 09/17/21 0830   senna-docusate (Senokot-S) tablet 1 tablet  1 tablet Oral QHS PRN Lenore Cordia, MD       topiramate (TOPAMAX) tablet 50 mg  50 mg Oral QHS Lenore Cordia, MD   50 mg at 09/17/21 2110   Current Outpatient Medications on File Prior to Visit  Medication Sig Dispense Refill   acetaminophen (TYLENOL) 500 MG tablet Take 500 mg by mouth every 6 (six) hours as needed (pain).     aspirin EC 81 MG EC tablet Take 1 tablet (81 mg total) by mouth daily.     B-D ULTRAFINE III SHORT PEN 31G X 8 MM MISC      Biotin 5000 MCG CAPS Take 5,000 mcg by mouth daily with breakfast.      cycloSPORINE (RESTASIS) 0.05 % ophthalmic emulsion Place 1 drop into both eyes 2 (two) times daily. 0.4 mL 0   diclofenac Sodium (VOLTAREN) 1 % GEL Apply 2  g topically 4 (four) times daily. (Patient taking differently: Apply 1 application topically daily as needed (pain).) 2 g 0   diphenhydrAMINE 25 mg in sodium chloride 0.9 % 50 mL Inject 25 mg into the vein See admin instructions. Administer 25 mg intravenously at start of Gamunex infusion, then administer 25 mg during infusion     Elderberry 500 MG CAPS Take 500 mg by mouth daily.     empagliflozin (JARDIANCE) 25 MG TABS tablet Take 25 mg by mouth daily.     esomeprazole (NEXIUM) 40 MG capsule Take 1 capsule (40 mg total) by mouth 2 (two) times daily. 60 capsule 0   Exenatide ER (BYDUREON BCISE) 2 MG/0.85ML AUIJ Inject 2 mg into the skin every Monday.     ezetimibe (ZETIA) 10 MG tablet TAKE 1 TABLET BY MOUTH EVERY DAY (Patient taking differently: Take 10 mg by mouth daily.) 90 tablet 1   Immune Globulin, Human, 40 GM/400ML SOLN Infusions are administered at home by home health nurse - last infusion 09/08/21 and 09/09/21; next infusion due 10/19/21 and 10/20/21     Insulin Disposable Pump (OMNIPOD DASH 5 PACK PODS) MISC Use with Humalog     levothyroxine (SYNTHROID) 150 MCG tablet Take 150 mcg by mouth daily before breakfast.     lidocaine (LIDODERM) 5 % Place 1 patch onto the skin daily. Remove & Discard patch within 12 hours or as directed by MD (Patient taking differently: Place 1 patch onto the skin daily as needed. Remove & Discard patch within 12 hours or as directed by MD) 30 patch 0   loratadine (CLARITIN) 10 MG tablet Take 1 tablet (10 mg total) by mouth at bedtime. 30 tablet 0   meclizine (ANTIVERT) 25 MG tablet Take 25 mg by mouth 3 (three) times daily as needed for dizziness (or migraine-related nausea).      montelukast (SINGULAIR) 10 MG tablet Take 1 tablet (10 mg total) by mouth every morning. 30 tablet 0   mycophenolate (CELLCEPT) 500 MG tablet Take 1,500 mg by mouth 2 (two) times daily. For myasthenia gravis (Patient not taking: Reported on 09/17/2021)     olmesartan (BENICAR) 20 MG tablet  Take 1 tablet (20 mg total) by mouth daily.  30 tablet 0   predniSONE (DELTASONE) 10 MG tablet Take 10 mg by mouth every morning. For myasthenia gravis     PRESCRIPTION MEDICATION Inhale into the lungs at bedtime. CPAP     PROAIR HFA 108 (90 BASE) MCG/ACT inhaler Inhale 2 puffs into the lungs every 6 (six) hours as needed for wheezing or shortness of breath.      riTUXimab (RITUXAN) 500 MG/50ML injection Inject into the vein every 6 (six) months.     rosuvastatin (CRESTOR) 40 MG tablet Take 1 tablet (40 mg total) by mouth daily. 90 tablet 3   spironolactone-hydrochlorothiazide (ALDACTAZIDE) 25-25 MG tablet Take 1 tablet by mouth every morning. 90 tablet 1   topiramate (TOPAMAX) 50 MG tablet Take 1 tablet (50 mg total) by mouth daily. Start 1 tablet daily x 1 week and then twice daily (Patient taking differently: Take 50 mg by mouth at bedtime.) 180 tablet 0   vitamin B-12 (CYANOCOBALAMIN) 1000 MCG tablet Take 1,000 mcg by mouth daily.     Vitamin D, Ergocalciferol, (DRISDOL) 50000 UNITS CAPS capsule Take 50,000 Units by mouth every Monday.       Radiology:   MR angio of the head 12/21/2019: Atherosclerotic disease right middle cerebral artery branch otherwise negative. No large vessel occlusion.  MRA chest with and without contrast 12/20/2019: 1. No evidence of acute aortic dissection, aortic aneurysm or other acute vascular abnormality. 2. 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 3. Heterogeneous and irregular/ulcerated atherosclerotic plaque along the descending thoracic aorta. 4. Concentric hypertrophy of the left ventricular myocardium suggests chronic systolic hypertension.  MRI of the brain 11/09/2020: 1. No acute intracranial abnormality. Expected evolution of the left thalamic lacunar infarct seen last year and otherwise normal for age noncontrast MRI appearance of the brain. 2. Prior cervical ACDF with  adjacent segment disease resulting in significant spinal stenosis at C2-C3. Query myelopathic symptoms. Dedicated Cervical Spine MRI would be valuable.  Cardiac Studies:   Lexiscan myoview stress test 06/25/2017: 1. Resting EKG NSR. Stress EKG: Non diagnostic for ischemia due to pharmacologic stress testing. No ST-T changes of ischemia. 8/10 chest pain with Lexiscan infusion. 2. Myocardial perfusion study was normal, without evidence of ischemia, ejection fraction 70%. Low risk study.  Echocardiogram 12/20/2019:  1. Left ventricular ejection fraction, by estimation, is 60 to 65%. The left ventricle has normal function. The left ventricle has no regional wall motion abnormalities. Left ventricular diastolic parameters are consistent with Grade I diastolic dysfunction (impaired relaxation). 2. Right ventricular systolic function is normal. The right ventricular size is normal. 3. Left atrial size was moderately dilated. 4. The mitral valve is grossly normal. Trivial mitral valve regurgitation. No evidence of mitral stenosis. 5. Mild calcification of the non coronary cusp.. The aortic valve is tricuspid. Aortic valve regurgitation is not visualized. No aortic stenosis is present. 6. The inferior vena cava is normal in size with greater than 50% respiratory variability,suggesting right atrial pressure of 3 mmHg.  Lower Extremity Venous Duplex  12/20/2019: RIGHT:  - There is no evidence of deep vein thrombosis in the lower extremity.  - No cystic structure found in the popliteal fossa.  LEFT:  - There is no evidence of deep vein thrombosis in the lower extremity.  - No cystic structure found in the popliteal fossa.   Carotid artery duplex 07/29/2021: Duplex suggests stenosis in the right internal carotid artery (>=70%). The right PSV internal/common carotid artery ratio of 4.73  is consistent with a stenosis of >70%.   Duplex suggests stenosis in the left internal carotid artery (50-69%). Right  vertebral artery flow is not visualized. Left vertebral artery flow is not visualized. No significant change from 01/07/2021. Follow up in six months is appropriate if clinically indicated.  EKG:  EKG 05/29/2021: Normal sinus rhythm at rate of 89 bpm, normal axis.  Nonspecific T abnormality.  No significant change from 01/01/2021.   Assessment     ICD-10-CM   1. Primary hypertension  I10     2. Hypercholesterolemia  E78.00       No orders of the defined types were placed in this encounter.   No orders of the defined types were placed in this encounter.   There are no discontinued medications.     Recommendations:   [name]  is a 71 y.o. African-American female  with  asymptomatic carotid stenosis, uncontrolled DM with stage 3 CKD and diabetic retinopathy,, hypertension, hyperlipidemia, dyspnea on exertion,  thyroid nodules, follows Dr. 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  I seen her 2 months ago for questionable weight gain and fluctuation in blood pressure including low blood pressure.  This was initiated by insurance home nursing.  However on exam she was normal with no hypertension, blood pressure was well controlled.  Today she returns to discuss carotid duplex, no change in carotid stenosis.  I also reviewed her lipid profile testing, LDL is not at goal.  Although LDL has improved on high intensity statin and Zetia, not at goal.  Options would be to start her on PCSK9 inhibitors or start Leqvio is an inpatient diagnosis.  Patient would also be a candidate for prevail-ASCVD trial with Obcetrapib 10 mg p.o. daily randomized trial.   I discussed extensively with the patient regarding the trial versus standard of care, discussed that she will continue with high intensity statin along with Zetia but  this will be in addition, she prefers to go on research trial with prevail-ASCVD trial.  Should be screened on her next office visit in 6 weeks.  Blood pressure is elevated today, will discontinue plain spironolactone and switch her to Aldactazide 25/25 mg in the morning.  She is presently taking furosemide on a as needed basis, she will continue the same for leg edema or weight gain.  I would like to see her back in 6 weeks for follow-up.  I suspect 10 pound weight gain over the past 2 months is probably etiology for elevated blood pressure today.  Weight loss again discussed with the patient.    Adrian Prows, MD, [facility] 09/18/2021, 7:17 AM Office: [phone] Fax: [phone] Pager: [phone]

## 2021-09-19 DIAGNOSIS — J45909 Unspecified asthma, uncomplicated: Secondary | ICD-10-CM | POA: Diagnosis not present

## 2021-09-19 DIAGNOSIS — Z7982 Long term (current) use of aspirin: Secondary | ICD-10-CM | POA: Diagnosis not present

## 2021-09-19 DIAGNOSIS — Z91041 Radiographic dye allergy status: Secondary | ICD-10-CM | POA: Diagnosis not present

## 2021-09-19 DIAGNOSIS — Z91013 Allergy to seafood: Secondary | ICD-10-CM | POA: Diagnosis not present

## 2021-09-19 DIAGNOSIS — I6522 Occlusion and stenosis of left carotid artery: Secondary | ICD-10-CM | POA: Diagnosis not present

## 2021-09-19 DIAGNOSIS — G7000 Myasthenia gravis without (acute) exacerbation: Secondary | ICD-10-CM | POA: Diagnosis not present

## 2021-09-19 DIAGNOSIS — Z888 Allergy status to other drugs, medicaments and biological substances status: Secondary | ICD-10-CM | POA: Diagnosis not present

## 2021-09-19 DIAGNOSIS — Z881 Allergy status to other antibiotic agents status: Secondary | ICD-10-CM | POA: Diagnosis not present

## 2021-09-19 DIAGNOSIS — Z7952 Long term (current) use of systemic steroids: Secondary | ICD-10-CM | POA: Diagnosis not present

## 2021-09-19 DIAGNOSIS — E7800 Pure hypercholesterolemia, unspecified: Secondary | ICD-10-CM | POA: Diagnosis present

## 2021-09-19 DIAGNOSIS — E890 Postprocedural hypothyroidism: Secondary | ICD-10-CM | POA: Diagnosis not present

## 2021-09-19 DIAGNOSIS — R2990 Unspecified symptoms and signs involving the nervous system: Secondary | ICD-10-CM | POA: Diagnosis not present

## 2021-09-19 DIAGNOSIS — E876 Hypokalemia: Secondary | ICD-10-CM | POA: Diagnosis not present

## 2021-09-19 DIAGNOSIS — E1169 Type 2 diabetes mellitus with other specified complication: Secondary | ICD-10-CM | POA: Diagnosis not present

## 2021-09-19 DIAGNOSIS — Z79890 Hormone replacement therapy (postmenopausal): Secondary | ICD-10-CM | POA: Diagnosis not present

## 2021-09-19 DIAGNOSIS — I69351 Hemiplegia and hemiparesis following cerebral infarction affecting right dominant side: Secondary | ICD-10-CM | POA: Diagnosis not present

## 2021-09-19 DIAGNOSIS — R200 Anesthesia of skin: Secondary | ICD-10-CM | POA: Diagnosis not present

## 2021-09-19 DIAGNOSIS — Z79899 Other long term (current) drug therapy: Secondary | ICD-10-CM | POA: Diagnosis not present

## 2021-09-19 DIAGNOSIS — K219 Gastro-esophageal reflux disease without esophagitis: Secondary | ICD-10-CM | POA: Diagnosis present

## 2021-09-19 DIAGNOSIS — R079 Chest pain, unspecified: Secondary | ICD-10-CM | POA: Diagnosis present

## 2021-09-19 DIAGNOSIS — I152 Hypertension secondary to endocrine disorders: Secondary | ICD-10-CM | POA: Diagnosis not present

## 2021-09-19 DIAGNOSIS — G4733 Obstructive sleep apnea (adult) (pediatric): Secondary | ICD-10-CM | POA: Diagnosis present

## 2021-09-19 DIAGNOSIS — I6523 Occlusion and stenosis of bilateral carotid arteries: Secondary | ICD-10-CM | POA: Diagnosis not present

## 2021-09-19 DIAGNOSIS — Z9641 Presence of insulin pump (external) (internal): Secondary | ICD-10-CM | POA: Diagnosis present

## 2021-09-19 DIAGNOSIS — Z7984 Long term (current) use of oral hypoglycemic drugs: Secondary | ICD-10-CM | POA: Diagnosis not present

## 2021-09-19 DIAGNOSIS — Z885 Allergy status to narcotic agent status: Secondary | ICD-10-CM | POA: Diagnosis not present

## 2021-09-19 DIAGNOSIS — M159 Polyosteoarthritis, unspecified: Secondary | ICD-10-CM | POA: Diagnosis present

## 2021-09-19 DIAGNOSIS — Z20822 Contact with and (suspected) exposure to covid-19: Secondary | ICD-10-CM | POA: Diagnosis not present

## 2021-09-19 DIAGNOSIS — Z794 Long term (current) use of insulin: Secondary | ICD-10-CM | POA: Diagnosis not present

## 2021-09-19 LAB — GLUCOSE, CAPILLARY
Glucose-Capillary: 302 mg/dL — ABNORMAL HIGH (ref 70–99)
Glucose-Capillary: 235 mg/dL — ABNORMAL HIGH (ref 70–99)
Glucose-Capillary: 244 mg/dL — ABNORMAL HIGH (ref 70–99)
Glucose-Capillary: 311 mg/dL — ABNORMAL HIGH (ref 70–99)
Glucose-Capillary: 181 mg/dL — ABNORMAL HIGH (ref 70–99)

## 2021-09-19 MED ORDER — POLYETHYLENE GLYCOL 3350 17 G PO PACK
17.0000 g | PACK | Freq: Every day | ORAL | Status: DC
  Administered 2021-09-22 – 2021-09-25 (×2): 17 g via ORAL
  Filled 2021-09-19 (×4): qty 1

## 2021-09-19 MED ORDER — SENNOSIDES-DOCUSATE SODIUM 8.6-50 MG PO TABS
2.0000 | ORAL_TABLET | Freq: Two times a day (BID) | ORAL | Status: DC
  Administered 2021-09-19 – 2021-09-26 (×12): 2 via ORAL
  Filled 2021-09-19 (×14): qty 2

## 2021-09-19 MED ORDER — INSULIN GLARGINE-YFGN 100 UNIT/ML ~~LOC~~ SOLN
40.0000 [IU] | Freq: Every day | SUBCUTANEOUS | Status: DC
  Administered 2021-09-19 – 2021-09-21 (×3): 40 [IU] via SUBCUTANEOUS
  Filled 2021-09-19 (×4): qty 0.4

## 2021-09-19 NOTE — Progress Notes (Addendum)
Message received from vascular surgeon Dr. Stanford Breed this afternoon that OR is not available unitl Thursday 09/24/2021.  Patient can be discharged from vascular standpoint for outpatient procedure. I met with the patient again this afternoon and discussed about the update. She lives alone, still feels weak does not feel comfortable going home. CIR was recommended by physical therapy but per CIR admission coordinator note, patient does not have strong medical necessity to obtain insurance authorization for CIR. We will get her seen by physical therapy again tomorrow. Based on her performance tomorrow, we may have clear options. The last and less desirable option would be to keep her inpatient till the procedure on Thursday.

## 2021-09-19 NOTE — Progress Notes (Signed)
Occupational Therapy Treatment Patient Details Name: [name] MRN: [id number] DOB: 12/13/1950 Today's Date: 09/19/2021   History of present illness Pt is a 71 y.o. female who presented with chest pain, right facial numbness and arm numbness 09/16/21, MRI/MRA 1/25 negative for acute intracranial changes with evidence of flow reducing stenosis at the right more than left proximal ICA. CT 1/26 negative for acute changes. PMH significant for seronegative myasthenia gravis, hypertension, asymptomatic carotid artery stenosis, Prior stroke in April 2021, Paraneoplastic retinopathy.   OT comments  Pt making slow but steady progress toward goals of being mod I so pt can dc home alone.  Pt has people that check in on her all day.  Pt states she has a carotid procedure on Monday. Will continue to follow after this procedure and feel acute rehab is best option.    Recommendations for follow up therapy are one component of a multi-disciplinary discharge planning process, led by the attending physician.  Recommendations may be updated based on patient status, additional functional criteria and insurance authorization.    Follow Up Recommendations  Acute inpatient rehab (3hours/day)    Assistance Recommended at Discharge Intermittent Supervision/Assistance  Patient can return home with the following  A little help with walking and/or transfers;A little help with bathing/dressing/bathroom;Assist for transportation;Help with stairs or ramp for entrance;Direct supervision/assist for medications management   Equipment Recommendations  None recommended by OT    Recommendations for Other Services      Precautions / Restrictions Precautions Precautions: Fall Precaution Comments: Double vision Restrictions Weight Bearing Restrictions: No       Mobility Bed Mobility               General bed mobility comments: Pt in chair on arrival    Transfers Overall transfer level: Needs  assistance Equipment used: Rolling walker (2 wheels) Transfers: Sit to/from Stand Sit to Stand: Min guard           General transfer comment: min guard to power up slowly and cues to reach back to chair when sitting back down.     Balance Overall balance assessment: Needs assistance Sitting-balance support: Feet supported Sitting balance-Leahy Scale: Good     Standing balance support: No upper extremity supported, During functional activity Standing balance-Leahy Scale: Fair Standing balance comment: Pt can stand at sink for short amounts of tie without assist but when walking or dynamically standing, pt requires walker.                           ADL either performed or assessed with clinical judgement   ADL Overall ADL's : Needs assistance/impaired Eating/Feeding: Set up;Sitting Eating/Feeding Details (indicate cue type and reason): pt able to open packages with very little assist. Grooming: Supervision/safety;Standing Grooming Details (indicate cue type and reason): Pt walked to sink with min guard to groom for 4 min at sink before returning to chair             Lower Body Dressing: Minimal assistance;Sit to/from stand Lower Body Dressing Details (indicate cue type and reason): assist when standing to manage clothes when not holding to walker. Pt donned socks without assist in chair             Functional mobility during ADLs: Min guard;Rolling walker (2 wheels) General ADL Comments: Pt limited by RUE parasthesia, weakness and impaired coordintion as well as DV.Pt with tremor in RUE that limits use of RUE but compensates well.  Extremity/Trunk Assessment Upper Extremity Assessment Upper Extremity Assessment: RUE deficits/detail RUE Deficits / Details: grip strength 3+/5, tremor noted RUE Sensation: decreased light touch RUE Coordination: decreased fine motor;decreased gross motor   Lower Extremity Assessment Lower Extremity Assessment: Defer to PT  evaluation        Vision   Vision Assessment?: Vision impaired- to be further tested in functional context;Yes   Perception     Praxis Praxis Praxis: Intact    Cognition Arousal/Alertness: Awake/alert Behavior During Therapy: WFL for tasks assessed/performed Overall Cognitive Status: Within Functional Limits for tasks assessed                                 General Comments: pt is Ox4 and cog is overall WFL for functional tasks assessed. Processing seems slow at times, however pt reports feeling groggy 2 medications.        Exercises      Shoulder Instructions       General Comments Pt is very motivated to be safe and independent.  States she has a procedure on Monday for carotid.  Feel rehab is best option to reach mod I so pt can d/c home.    Pertinent Vitals/ Pain       Pain Assessment Pain Assessment: Faces Faces Pain Scale: Hurts little more Pain Location: headache Pain Descriptors / Indicators: Discomfort Pain Intervention(s): Limited activity within patient's tolerance, Monitored during session, Patient requesting pain meds-RN notified  Home Living                                          Prior Functioning/Environment              Frequency  Min 2X/week        Progress Toward Goals  OT Goals(current goals can now be found in the care plan section)  Progress towards OT goals: Progressing toward goals  Acute Rehab OT Goals Patient Stated Goal: to be able to live at home alone again OT Goal Formulation: With patient Time For Goal Achievement: 10/01/21 Potential to Achieve Goals: Good ADL Goals Pt Will Perform Grooming: with modified independence;standing Pt Will Perform Lower Body Bathing: with modified independence;sit to/from stand Pt Will Perform Lower Body Dressing: with modified independence;sit to/from stand Pt Will Transfer to Toilet: with modified independence;ambulating;regular height toilet;grab  bars Additional ADL Goal #1: Pt will indep verbalize at least 3 fall prevention strategies to apply to the home setting  Plan Discharge plan remains appropriate    Co-evaluation                 AM-PAC OT "6 Clicks" Daily Activity     Outcome Measure   Help from another person eating meals?: A Little Help from another person taking care of personal grooming?: A Little Help from another person toileting, which includes using toliet, bedpan, or urinal?: A Little Help from another person bathing (including washing, rinsing, drying)?: A Little Help from another person to put on and taking off regular upper body clothing?: None Help from another person to put on and taking off regular lower body clothing?: A Little 6 Click Score: 19    End of Session Equipment Utilized During Treatment: Gait belt;Rolling walker (2 wheels)  OT Visit Diagnosis: Unsteadiness on feet (R26.81);Other abnormalities of gait and mobility (R26.89);Muscle weakness (generalized) (M62.81);Pain;Hemiplegia and  hemiparesis Hemiplegia - Right/Left: Right Hemiplegia - dominant/non-dominant: Dominant Hemiplegia - caused by: Unspecified Pain - part of body:  (head)   Activity Tolerance Patient tolerated treatment well   Patient Left in chair;with call bell/phone within reach;with nursing/sitter in room   Nurse Communication Mobility status        Time: 4665-9935 OT Time Calculation (min): 18 min  Charges: OT General Charges $OT Visit: 1 Visit OT Treatments $Self Care/Home Management : 8-22 mins   Glenford Peers 09/19/2021, 10:36 AM

## 2021-09-19 NOTE — Plan of Care (Signed)

## 2021-09-19 NOTE — Progress Notes (Addendum)
°  Progress Note    09/19/2021 10:51 AM * No surgery date entered *  Subjective:  no further R sided weakness or other stroke like symptoms   Vitals:   09/19/21 0315 09/19/21 0823  BP: (!) 122/58 (!) 132/58  Pulse: 82 80  Resp: 16 17  Temp: 98.6 F (37 C) 97.9 F (36.6 C)  SpO2: 98% 98%   Physical Exam: Lungs:  non labored Extremities: grip strength asymmetrical Neurologic: A&O  CBC    Component Value Date/Time   WBC 5.5 09/18/2021 0409   RBC 3.86 (L) 09/18/2021 0409   HGB 11.6 (L) 09/18/2021 0409   HCT 35.2 (L) 09/18/2021 0409   PLT 209 09/18/2021 0409   MCV 91.2 09/18/2021 0409   MCH 30.1 09/18/2021 0409   MCHC 33.0 09/18/2021 0409   RDW 13.3 09/18/2021 0409   LYMPHSABS 0.4 (L) 09/18/2021 0409   MONOABS 0.1 09/18/2021 0409   EOSABS 0.0 09/18/2021 0409   BASOSABS 0.0 09/18/2021 0409    BMET    Component Value Date/Time   NA 137 09/18/2021 0409   NA 143 05/31/2019 1101   K 3.5 09/18/2021 0409   CL 104 09/18/2021 0409   CO2 25 09/18/2021 0409   GLUCOSE 399 (H) 09/18/2021 0409   BUN 13 09/18/2021 0409   BUN 24 05/31/2019 1101   CREATININE 1.24 (H) 09/18/2021 0409   CALCIUM 9.3 09/18/2021 0409   GFRNONAA 47 (L) 09/18/2021 0409   GFRAA >60 12/28/2019 1159    INR    Component Value Date/Time   INR 0.9 04/08/2009 1425     Intake/Output Summary (Last 24 hours) at 09/19/2021 1051 Last data filed at 09/18/2021 2200 Gross per 24 hour  Intake 480 ml  Output --  Net 480 ml     Assessment/Plan:  71 y.o. female with symptomatic L ICA stenosis and high grade asymptomatic R ICA stenosis  No further changes in neurological exam Plan is for L sided TCAR on Monday with Dr. Stanford Breed Continue aspirin, plavix, and statin All questions answered she is agreeable to surgery Monday    Dagoberto Ligas, PA-C [facility] [phone] 09/19/2021 10:51 AM  VASCULAR STAFF ADDENDUM: I have independently interviewed and examined the patient. I  agree with the above.  Unfortunately, no OR availability until Thursday 09/24/21. I explained to the patient she can go home or remain admitted or transfer to CIR. We will get her taken care of either way. She prefers to stay admitted, but I encouraged her to discuss this with the primary service.  She meets inpatient criteria from my standpoint.  Yevonne Aline. Stanford Breed, MD [facility] Phone Number: [phone] 09/19/2021 2:42 PM

## 2021-09-19 NOTE — Progress Notes (Signed)
Inpatient Diabetes Program Recommendations  AACE/ADA: New Consensus Statement on Inpatient Glycemic Control (2015)  Target Ranges:  Prepandial:   less than 140 mg/dL      Peak postprandial:   less than 180 mg/dL (1-2 hours)      Critically ill patients:  140 - 180 mg/dL   Lab Results  Component Value Date   GLUCAP 235 (H) 09/19/2021   HGBA1C 9.7 (H) 09/17/2021    Review of Glycemic Control  Diabetes history: DM2 Outpatient Diabetes medications: Insulin pump.  Does not know settings Current orders for Inpatient glycemic control: Semglee 40 units QD, Novolog 0-15 units TID and 0-5 units QHS, Prednisone 10  Inpatient Diabetes Program Recommendations:   Patient for surgery Monday so will not place insulin pump back on today as planned.  Please consider: -Increase Novolog meal coverage to 8 units tid if eats 50% -D/C insulin pump orders  Noted Semglee increased to 40 units qd.  Spoke with patient regarding insulin pump and patient understands plan to restart insulin pump post surgery on Monday. Patient states she needs increase in meal coverage to @ least 8 units tid if eats 50% so sent Deport to Dr. Pietro Cassis and agree with patient based on meals and CBGs.  Prednisone now  @ 10 mg qd which patient states she stays on @ home.  Thank you, Nani Gasser. Meliton Samad, RN, MSN, CDE  Diabetes Coordinator Inpatient Glycemic Control Team Team Pager [phone] (8am-5pm) 09/19/2021 10:17 AM

## 2021-09-19 NOTE — Progress Notes (Signed)
PROGRESS NOTE  [name]  DOB: 06-15-1951  PCP: [name], DO WUJ:[id number]  DOA: 09/16/2021  LOS: 0 days  Hospital Day: 4  Chief Complaint  Patient presents with   Chest Pain   Brief narrative: [name] is a 71 y.o. 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  She felt similar to when she had previous stroke and hence came to the ED. Of note, patient recently had diarrhea.  Her neurologist at [facility] advised her to hold CellCept for 10 days after which diarrhea resolved.  In the ED, her blood pressure was elevated to 173/70 CT head was negative. Serum glucose was elevated to 239. SARS-CoV-2 and influenza PCR negative. Chest x-ray negative for focal consolidation, edema, effusion. CT head without contrast negative for acute intracranial findings. Admitted to hospitalist service Neurology consulted.  Subjective: Patient was seen and examined this morning.  Sitting up in chair.  Not in distress.  No new symptoms.  Blood sugars running elevated Plan from vascular surgery for TCAR on 1/30.  Assessment/Plan: Right facial numbness History of left thalamic infarct with mild right-sided residual weakness -Admitted for stroke work-up.   -Neurology consult appreciated.  Also need to consider complex migraine versus psychogenic presentation.   -CT head and MRI brain unremarkable. -intracranial MRA did not show any emergent findings. -Neck MRA showed evidence of flow reducing stenosis of the right more than left proximal ICA.  Recommended CTA.  If significant, may need vascular surgery involvement. -A1c 9.1, LDL  120 -Prior to admission, patient was not aspirin 81 mg daily, Zetia 10 mg daily, Crestor 40 mg daily.  Currently continue the same -PT eval obtained  Bilateral carotid artery stenosis. -CTA head and neck showed soft plaque in the proximal right ICA resulting in 80 to 90% stenosis, mixed plaque in the proximal left ICA resulting in 60% stenosis.  Mild atherosclerotic plaque in the intracranial ICAs without significant stenosis.  Otherwise patent intracranial vasculature. -Vascular surgery consultation obtained.  Noted to plantar TCAR on on the left on 1/30.   Myasthenia gravis -Follows with [facility].   -CellCept recently held due to diarrhea which has since resolved.   -Continue chronic prednisone 10 mg daily.  -She receives Rituxan and IVIG infusions.  Weekly through [place].  Uncontrolled type 2 diabetes mellitus with hyperglycemia -A1c 9.7 on 09/17/2021 -Home meds include insulin pump and Jardiance.  Currently insulin pump is off. -Patient is on Semglee and short-acting insulin.  Increase Semglee to 40 units.  This morning.  Also continue scheduled and sliding scale Premeal insulin.  Continue to monitor Recent Labs  Lab 09/18/21 1602 09/18/21 2145 09/19/21 0216 09/19/21 0614 09/19/21 1212  GLUCAP 403* 429* 302* 235* 244*    Essential hypertension -Permissive hypertension allowed for now -Home meds include olmesartan 20 mg daily, Aldactone 25 mg daily, HCTZ 25 mg daily -Currently on hold for permissive hypertension.  Since there is no evidence of true stroke, I would resume her Aldactone/HCTZ today.  Keep olmesartan on hold.   -Continue monitor blood pressure.   Hypothyroidism Continue Synthroid.   Hyperlipidemia Continue rosuvastatin and Zetia.   OSA Continue CPAP nightly.   Hypokalemia/hypomagnesemia -Oral and IV  replacement ordered. -Continue to monitor. Recent Labs  Lab 09/16/21 1528 09/17/21 0404 09/17/21 0725 09/18/21 0409  K 3.0* 2.7* 3.0* 3.5  MG  --   1.4*  --   --     Mobility: Encourage ambulation Living condition: Lives at home Goals of care:   Code Status: Full Code  Nutritional status: Body mass index is 27.83 kg/m.      Diet:  Diet Order             Diet heart healthy/carb modified Room service appropriate? Yes; Fluid consistency: Thin  Diet effective now                  DVT prophylaxis:  enoxaparin (LOVENOX) injection 40 mg Start: 09/17/21 0800   Antimicrobials: None Fluid: None Consultants: Neurology Family Communication: None at bedside  Status is: Observation  Continue in-hospital care because: Further stroke work-up.  May need vascular intervention Level of care: Telemetry Medical   Dispo: The patient is from: Home              Anticipated d/c is to: Home likely              Patient currently is not medically stable to d/c.   Difficult to place patient No     Infusions:    Scheduled Meds:  aspirin EC  325 mg Oral Daily   Chlorhexidine Gluconate Cloth  6 each Topical Daily   clopidogrel  75 mg Oral Daily   cycloSPORINE  1 drop Both Eyes BID   enoxaparin (LOVENOX) injection  40 mg Subcutaneous Q24H   ezetimibe  10 mg Oral Daily   spironolactone  25 mg Oral Daily   And   hydrochlorothiazide  25 mg Oral Daily   insulin aspart  0-15 Units Subcutaneous TID WC   insulin aspart  0-5 Units Subcutaneous QHS   insulin aspart  5 Units Subcutaneous TID WC   insulin glargine-yfgn  40 Units Subcutaneous Daily   levothyroxine  150 mcg Oral QAC breakfast   montelukast  10 mg Oral BH-q7a   polyethylene glycol  17 g Oral Daily   predniSONE  10 mg Oral BH-q7a   rosuvastatin  40 mg Oral Daily   senna-docusate  2 tablet Oral BID   topiramate  50 mg Oral QHS    PRN meds: acetaminophen **OR** acetaminophen (TYLENOL) oral liquid 160 mg/5 mL **OR** acetaminophen, albuterol, ondansetron (ZOFRAN) IV, sodium chloride flush   Antimicrobials: Anti-infectives (From admission, onward)    None        Objective: Vitals:   09/19/21 0823 09/19/21 1208  BP: (!) 132/58 (!) 152/78  Pulse: 80 85  Resp: 17 17  Temp: 97.9 F (36.6 C) 98.1 F (36.7 C)  SpO2: 98% 100%    Intake/Output Summary (Last 24 hours) at 09/19/2021 1353 Last data filed at 09/18/2021 2200 Gross per 24 hour  Intake 240 ml  Output --  Net 240 ml    Filed Weights   09/16/21 1525  Weight: 83 kg   Weight change:  Body mass index is 27.83 kg/m.   Physical Exam: General exam: Pleasant elderly African-American female.  Not in physical distress Skin: No rashes, lesions or ulcers. HEENT: Atraumatic, normocephalic, no obvious bleeding Lungs: Clear to auscultation bilaterally CVS: Regular rate and rhythm, no murmur GI/Abd soft, nontender, nondistended, bowel sound present CNS: Alert, awake, oriented x3 Psychiatry: Mood appropriate Extremities: No pedal edema, no calf tenderness  Data Review: I have personally reviewed the laboratory  data and studies available.  F/u labs ordered Unresulted Labs (From admission, onward)     Start     Ordered   09/20/21 0500  CBC with Differential/Platelet  Tomorrow morning,   R        09/19/21 0745   09/20/21 2840  Basic metabolic panel  Tomorrow morning,   R        09/19/21 0745            Signed, Terrilee Croak, MD [facility] 09/19/2021

## 2021-09-19 NOTE — Progress Notes (Signed)
Inpatient Rehab Admissions Coordinator:   Pt rescreened for CIR.  Do not feel that she has the medical necessity needed in order to obtain auth from [facility] for CIR admission at this time.  Would defer to therapy for f/u venue. Note plans for TCAR on Monday.   Shann Medal, PT, DPT Admissions Coordinator [phone] 09/19/21  1:43 PM

## 2021-09-19 NOTE — Progress Notes (Signed)
Brief Nutrition Note  RN contacted RD via on call pager. Pt requesting snacks between meals and is concerned that she is not getting an HS snack. RD obtained preferences and ordered snacks between meals TID per pt preferences.   If further nutrition needs arise, please re-consult RD.   Loistine Chance, RD, LDN, [place] Registered Dietitian II Certified Diabetes Care and Education Specialist Please refer to [facility] for RD and/or RD on-call/weekend/after hours pager

## 2021-09-20 LAB — GLUCOSE, CAPILLARY
Glucose-Capillary: 111 mg/dL — ABNORMAL HIGH (ref 70–99)
Glucose-Capillary: 106 mg/dL — ABNORMAL HIGH (ref 70–99)
Glucose-Capillary: 252 mg/dL — ABNORMAL HIGH (ref 70–99)
Glucose-Capillary: 296 mg/dL — ABNORMAL HIGH (ref 70–99)
Glucose-Capillary: 191 mg/dL — ABNORMAL HIGH (ref 70–99)

## 2021-09-20 LAB — BASIC METABOLIC PANEL
Anion gap: 9 (ref 5–15)
BUN: 20 mg/dL (ref 8–23)
CO2: 28 mmol/L (ref 22–32)
Calcium: 8.8 mg/dL — ABNORMAL LOW (ref 8.9–10.3)
Chloride: 101 mmol/L (ref 98–111)
Creatinine, Ser: 1.08 mg/dL — ABNORMAL HIGH (ref 0.44–1.00)
GFR, Estimated: 56 mL/min — ABNORMAL LOW (ref >60)
Glucose, Bld: 114 mg/dL — ABNORMAL HIGH (ref 70–99)
Potassium: 2.5 mmol/L — CL (ref 3.5–5.1)
Sodium: 138 mmol/L (ref 135–145)

## 2021-09-20 LAB — CBC WITH DIFFERENTIAL/PLATELET
Abs Immature Granulocytes: 0.02 10*3/uL (ref 0.00–0.07)
Basophils Absolute: 0 10*3/uL (ref 0.0–0.1)
Basophils Relative: 1 %
Eosinophils Absolute: 0.1 10*3/uL (ref 0.0–0.5)
Eosinophils Relative: 1 %
HCT: 31.6 % — ABNORMAL LOW (ref 36.0–46.0)
Hemoglobin: 10.5 g/dL — ABNORMAL LOW (ref 12.0–15.0)
Immature Granulocytes: 0 %
Lymphocytes Relative: 27 %
Lymphs Abs: 1.8 10*3/uL (ref 0.7–4.0)
MCH: 30.4 pg (ref 26.0–34.0)
MCHC: 33.2 g/dL (ref 30.0–36.0)
MCV: 91.6 fL (ref 80.0–100.0)
Monocytes Absolute: 0.5 10*3/uL (ref 0.1–1.0)
Monocytes Relative: 8 %
Neutro Abs: 4 10*3/uL (ref 1.7–7.7)
Neutrophils Relative %: 63 %
Platelets: 223 10*3/uL (ref 150–400)
RBC: 3.45 MIL/uL — ABNORMAL LOW (ref 3.87–5.11)
RDW: 13.4 % (ref 11.5–15.5)
WBC: 6.4 10*3/uL (ref 4.0–10.5)
nRBC: 0 % (ref 0.0–0.2)

## 2021-09-20 LAB — MAGNESIUM: Magnesium: 1.8 mg/dL (ref 1.7–2.4)

## 2021-09-20 MED ORDER — SPIRONOLACTONE 25 MG PO TABS
50.0000 mg | ORAL_TABLET | Freq: Every day | ORAL | Status: DC
  Administered 2021-09-21 – 2021-09-27 (×6): 50 mg via ORAL
  Filled 2021-09-20 (×6): qty 2

## 2021-09-20 MED ORDER — POTASSIUM CHLORIDE 10 MEQ/100ML IV SOLN
10.0000 meq | INTRAVENOUS | Status: AC
  Administered 2021-09-20 (×6): 10 meq via INTRAVENOUS
  Filled 2021-09-20 (×5): qty 100

## 2021-09-20 NOTE — Progress Notes (Signed)
PROGRESS NOTE  [name]  DOB: Nov 29, 1950  PCP: [name], DO EYC:[id number]  DOA: 09/16/2021  LOS: 1 day  Hospital Day: 5  Chief Complaint  Patient presents with   Chest Pain   Brief narrative: [name] is a 71 y.o. 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  She felt similar to when she had previous stroke and hence came to the ED. Of note, patient recently had diarrhea.  Her neurologist at [facility] advised her to hold CellCept for 10 days after which diarrhea resolved.  In the ED, her blood pressure was elevated to 173/70 CT head was negative. Serum glucose was elevated to 239. SARS-CoV-2 and influenza PCR negative. Chest x-ray negative for focal consolidation, edema, effusion. CT head without contrast negative for acute intracranial findings. Admitted to hospitalist service Neurology consulted.  Subjective: Patient was seen and examined this morning.  Lying down in bed.  Feels weak.  Potassium significant low at 2.5.  Pending PT eval again today.  Assessment/Plan: Right facial numbness History of left thalamic infarct with mild right-sided residual weakness -Admitted for stroke work-up.   -Neurology consult appreciated.  Also need to consider complex migraine versus psychogenic presentation.   -CT head and MRI brain unremarkable. -intracranial MRA did not show any emergent findings. -Neck MRA showed evidence of flow reducing stenosis of the right more than left proximal ICA.  Recommended CTA.  If significant, may need vascular surgery involvement. -A1c 9.1, LDL 120 -Prior to admission, patient was  not aspirin 81 mg daily, Zetia 10 mg daily, Crestor 40 mg daily.  Currently continue the same -PT eval obtained  Bilateral carotid artery stenosis. -CTA head and neck showed soft plaque in the proximal right ICA resulting in 80 to 90% stenosis, mixed plaque in the proximal left ICA resulting in 60% stenosis.  Mild atherosclerotic plaque in the intracranial ICAs without significant stenosis.  Otherwise patent intracranial vasculature. -Vascular surgery consultation obtained.  Noted to plantar TCAR on on the left.  Initially planned on 1/30 but because of OR unavailability, it has been rescheduled for 2/1.  Hypokalemia/hypomagnesemia -Potassium low again at 2.5 today.  Oral and IV replacement given.  I stopped HCTZ and increased the dose of Aldactone. -Continue to monitor. Recent Labs  Lab 09/16/21 1528 09/17/21 0404 09/17/21 0725 09/18/21 0409 09/20/21 0400  K 3.0* 2.7* 3.0* 3.5 2.5*  MG  --  1.4*  --   --  1.8    Essential hypertension -Permissive hypertension allowed for now -Home meds include olmesartan 20 mg daily, Aldactone 25 mg daily, HCTZ 25 mg daily -Because of hypokalemia, HCTZ has been stopped.  Aldactone dose increased.  Blood pressure in normal range.  If it rises, will resume olmesartan. -Continue monitor blood pressure.  Uncontrolled type 2 diabetes mellitus with hyperglycemia -A1c 9.7 on 09/17/2021 -Home meds include insulin pump and Jardiance.  Currently insulin pump is off. -Patient is on Semglee and short-acting insulin.  Increase Semglee to 40 units.  This morning.  Also continue scheduled and sliding scale Premeal insulin.  Continue to monitor Recent Labs  Lab 09/19/21 1212 09/19/21 1629 09/19/21 2140 09/20/21 0314 09/20/21 0608  GLUCAP 244* 311* 181*  111* 106*    Hyperlipidemia Continue rosuvastatin and Zetia.  Myasthenia gravis -Follows with [facility] neurology.   -CellCept recently held due to diarrhea which has since resolved.   -Continue chronic  prednisone 10 mg daily.  -She receives Rituxan and IVIG infusions.  Weekly through [place].   Hypothyroidism Continue Synthroid.    OSA Continue CPAP nightly.    Mobility: Encourage ambulation Living condition: Lives at home Goals of care:   Code Status: Full Code  Nutritional status: Body mass index is 27.83 kg/m.      Diet:  Diet Order             Diet heart healthy/carb modified Room service appropriate? Yes; Fluid consistency: Thin  Diet effective now                  DVT prophylaxis:  enoxaparin (LOVENOX) injection 40 mg Start: 09/17/21 0800   Antimicrobials: None Fluid: None Consultants: Neurology Family Communication: None at bedside  Status is: Observation  Continue in-hospital care because: Pending TCAR inpatient versus outpatient.  Patient feels weak and unable to go back to living by self Level of care: Telemetry Medical   Dispo: The patient is from: Home              Anticipated d/c is to: Pending PT eval again today.              Patient currently is not medically stable to d/c.   Difficult to place patient No     Infusions:   potassium chloride 10 mEq (09/20/21 0943)    Scheduled Meds:  aspirin EC  325 mg Oral Daily   Chlorhexidine Gluconate Cloth  6 each Topical Daily   clopidogrel  75 mg Oral Daily   cycloSPORINE  1 drop Both Eyes BID   enoxaparin (LOVENOX) injection  40 mg Subcutaneous Q24H   ezetimibe  10 mg Oral Daily   insulin aspart  0-15 Units Subcutaneous TID WC   insulin aspart  0-5 Units Subcutaneous QHS   insulin aspart  5 Units Subcutaneous TID WC   insulin glargine-yfgn  40 Units Subcutaneous Daily   levothyroxine  150 mcg Oral QAC breakfast   montelukast  10 mg Oral BH-q7a   polyethylene glycol  17 g Oral Daily   predniSONE  10 mg Oral BH-q7a   rosuvastatin  40 mg Oral Daily   senna-docusate  2 tablet Oral BID   [START ON 09/21/2021] spironolactone  50 mg Oral Daily   topiramate  50 mg Oral QHS    PRN  meds: acetaminophen **OR** acetaminophen (TYLENOL) oral liquid 160 mg/5 mL **OR** acetaminophen, albuterol, ondansetron (ZOFRAN) IV, sodium chloride flush   Antimicrobials: Anti-infectives (From admission, onward)    None       Objective: Vitals:   09/20/21 0314 09/20/21 0828  BP: 129/64 132/69  Pulse: 78 79  Resp: 17 17  Temp: 98.4 F (36.9 C) 98.8 F (37.1 C)  SpO2: 98% 99%    Intake/Output Summary (Last 24 hours) at 09/20/2021 0955 Last data filed at 09/20/2021 0943 Gross per 24 hour  Intake 240 ml  Output --  Net 240 ml    Filed Weights   09/16/21 1525  Weight: 83 kg   Weight change:  Body mass index is 27.83 kg/m.   Physical Exam: General exam: Pleasant elderly African-American female.  Not in physical distress.  Feels weak Skin: No rashes, lesions or ulcers. HEENT: Atraumatic, normocephalic, no obvious bleeding Lungs: Clear to auscultation  bilaterally CVS: Regular rate and rhythm, no murmur GI/Abd soft, nontender, nondistended, bowel sound present CNS: Alert, awake, oriented x3 Psychiatry: Mood appropriate Extremities: No pedal edema, no calf tenderness  Data Review: I have personally reviewed the laboratory data and studies available.  F/u labs ordered Unresulted Labs (From admission, onward)    None       Signed, Terrilee Croak, MD [facility] 09/20/2021

## 2021-09-20 NOTE — Progress Notes (Signed)
Pt. Places herself on cpap when ready. 

## 2021-09-20 NOTE — Progress Notes (Signed)
Physical Therapy Treatment Patient Details Name: [name] MRN: [id number] DOB: [birth year] Today's Date: 09/20/2021   History of Present Illness Pt is a 71 y.o. female who presented with chest pain, right facial numbness and arm numbness 09/16/21, MRI/MRA 1/25 negative for acute intracranial changes with evidence of flow reducing stenosis at the right more than left proximal ICA. CT 1/26 negative for acute changes. PMH significant for seronegative myasthenia gravis, hypertension, asymptomatic carotid artery stenosis, Prior stroke in April 2021, Paraneoplastic retinopathy.    PT Comments    Patient fatigued this date and stating "I feel more tired". Patient requires min guard for ambulation due to R LE weakness and balance deficits. Patient unable to manage independently at home at this time due to decreased activity tolerance, impaired balance, and weakness. Patient would benefit from short term rehab at SNF to assist with returning to PLOF to return home independently.    Recommendations for follow up therapy are one component of a multi-disciplinary discharge planning process, led by the attending physician.  Recommendations may be updated based on patient status, additional functional criteria and insurance authorization.  Follow Up Recommendations  Skilled nursing-short term rehab (<3 hours/day)     Assistance Recommended at Discharge Intermittent Supervision/Assistance  Patient can return home with the following A little help with walking and/or transfers;Assist for transportation;Help with stairs or ramp for entrance;Assistance with cooking/housework   Equipment Recommendations  None recommended by PT    Recommendations for Other Services       Precautions / Restrictions Precautions Precautions: Fall Precaution Comments: Double vision Restrictions Weight Bearing Restrictions: No     Mobility  Bed Mobility Overal bed mobility: Modified Independent                   Transfers Overall transfer level: Needs assistance Equipment used: Rolling Kyshawn Teal (2 wheels) Transfers: Sit to/from Stand Sit to Stand: Min guard                Ambulation/Gait Ambulation/Gait assistance: Min guard Gait Distance (Feet): 100 Feet Assistive device: Rolling Raelie Lohr (2 wheels) Gait Pattern/deviations: Step-through pattern, Decreased stride length, Trunk flexed, Decreased dorsiflexion - right, Decreased weight shift to right, Decreased stance time - right Gait velocity: Decreased     General Gait Details: min guard for safety. easily fatigued this session. Difficulty advancing R LE at times requiring hip thrust to assist in advancement   Stairs             Wheelchair Mobility    Modified Rankin (Stroke Patients Only)       Balance Overall balance assessment: Needs assistance Sitting-balance support: Feet supported Sitting balance-Leahy Scale: Good     Standing balance support: During functional activity, Bilateral upper extremity supported, Reliant on assistive device for balance Standing balance-Leahy Scale: Poor                              Cognition Arousal/Alertness: Awake/alert Behavior During Therapy: WFL for tasks assessed/performed Overall Cognitive Status: Within Functional Limits for tasks assessed                                          Exercises      General Comments        Pertinent Vitals/Pain Pain Assessment Pain Assessment: Faces Faces Pain Scale: Hurts a little bit Pain  Location: headache Pain Descriptors / Indicators: Discomfort Pain Intervention(s): Monitored during session    Home Living                          Prior Function            PT Goals (current goals can now be found in the care plan section) Acute Rehab PT Goals Patient Stated Goal: Back to independence at home PT Goal Formulation: With patient Time For Goal Achievement: 10/01/21 Potential to  Achieve Goals: Good Progress towards PT goals: Progressing toward goals    Frequency    Min 3X/week      PT Plan Current plan remains appropriate    Co-evaluation              AM-PAC PT "6 Clicks" Mobility   Outcome Measure  Help needed turning from your back to your side while in a flat bed without using bedrails?: None Help needed moving from lying on your back to sitting on the side of a flat bed without using bedrails?: None Help needed moving to and from a bed to a chair (including a wheelchair)?: A Little Help needed standing up from a chair using your arms (e.g., wheelchair or bedside chair)?: A Little Help needed to walk in hospital room?: A Little Help needed climbing 3-5 steps with a railing? : Total 6 Click Score: 18    End of Session Equipment Utilized During Treatment: Gait belt Activity Tolerance: Patient tolerated treatment well Patient left: in bed;with call bell/phone within reach;with bed alarm set Nurse Communication: Mobility status PT Visit Diagnosis: Unsteadiness on feet (R26.81);Difficulty in walking, not elsewhere classified (R26.2)     Time: 2694-8546 PT Time Calculation (min) (ACUTE ONLY): 25 min  Charges:  $Gait Training: 23-37 mins                     Rusell Meneely A. Gilford Rile PT, DPT Acute Rehabilitation Services Pager [phone] Office [phone]    Linna Hoff 09/20/2021, 5:05 PM

## 2021-09-20 NOTE — Progress Notes (Addendum)
Date and time results received: 09/20/21    Test: Potassium Critical Value: 2.5  Name of Provider Notified: B. Chotiner, MD  Orders Received? Or Actions Taken?: Actions Taken: Awaiting MD response.

## 2021-09-20 NOTE — Progress Notes (Addendum)
Patient c/o headache, medicated prn. Patient reports feeling weaker today and increased right arm numbness; patient has resolve today from constipation post laxatives; receiving replacement K+; continue to monitor CBG's. No acute distress. Dr. Pietro Cassis notified.

## 2021-09-21 LAB — GLUCOSE, CAPILLARY
Glucose-Capillary: 178 mg/dL — ABNORMAL HIGH (ref 70–99)
Glucose-Capillary: 152 mg/dL — ABNORMAL HIGH (ref 70–99)
Glucose-Capillary: 160 mg/dL — ABNORMAL HIGH (ref 70–99)
Glucose-Capillary: 227 mg/dL — ABNORMAL HIGH (ref 70–99)
Glucose-Capillary: 297 mg/dL — ABNORMAL HIGH (ref 70–99)
Glucose-Capillary: 140 mg/dL — ABNORMAL HIGH (ref 70–99)

## 2021-09-21 LAB — BASIC METABOLIC PANEL
Anion gap: 5 (ref 5–15)
BUN: 18 mg/dL (ref 8–23)
CO2: 28 mmol/L (ref 22–32)
Calcium: 8.4 mg/dL — ABNORMAL LOW (ref 8.9–10.3)
Chloride: 103 mmol/L (ref 98–111)
Creatinine, Ser: 1.02 mg/dL — ABNORMAL HIGH (ref 0.44–1.00)
GFR, Estimated: 60 mL/min — ABNORMAL LOW (ref >60)
Glucose, Bld: 178 mg/dL — ABNORMAL HIGH (ref 70–99)
Potassium: 2.7 mmol/L — CL (ref 3.5–5.1)
Sodium: 136 mmol/L (ref 135–145)

## 2021-09-21 LAB — MAGNESIUM
Magnesium: 1.9 mg/dL (ref 1.7–2.4)
Magnesium: 2 mg/dL (ref 1.7–2.4)

## 2021-09-21 LAB — PHOSPHORUS: Phosphorus: 3.1 mg/dL (ref 2.5–4.6)

## 2021-09-21 MED ORDER — TRAMADOL HCL 50 MG PO TABS
50.0000 mg | ORAL_TABLET | Freq: Four times a day (QID) | ORAL | Status: DC | PRN
  Administered 2021-09-21: 09:00:00 50 mg via ORAL
  Filled 2021-09-21 (×2): qty 1

## 2021-09-21 MED ORDER — POTASSIUM CHLORIDE CRYS ER 20 MEQ PO TBCR
40.0000 meq | EXTENDED_RELEASE_TABLET | ORAL | Status: AC
  Administered 2021-09-21 (×3): 40 meq via ORAL
  Filled 2021-09-21 (×3): qty 2

## 2021-09-21 NOTE — Progress Notes (Signed)
PROGRESS NOTE  [name]  DOB: [birth year]  PCP: [name], DO EVO:[id number]  DOA: 09/16/2021  LOS: 2 days  Hospital Day: 6  Chief Complaint  Patient presents with   Chest Pain   Brief narrative: [name] is a 71 y.o. 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  She felt similar to when she had previous stroke and hence came to the ED. Of note, patient recently had diarrhea.  Her neurologist at [facility] advised her to hold CellCept for 10 days after which diarrhea resolved.  In the ED, her blood pressure was elevated to 173/70 CT head was negative. Serum glucose was elevated to 239. SARS-CoV-2 and influenza PCR negative. Chest x-ray negative for focal consolidation, edema, effusion. CT head without contrast negative for acute intracranial findings. Admitted to hospitalist service Neurology consulted.  Subjective: Patient was seen and examined this morning.  Sitting up in chair.  Not in distress.   Feels weak.  Has headache.  Constipated for last 3 days.  Assessment/Plan: Right facial numbness History of left thalamic infarct with mild right-sided residual weakness -Admitted for stroke work-up.   -Neurology consult appreciated.  Also need to consider complex migraine versus psychogenic presentation.   -CT head and MRI brain unremarkable. -intracranial MRA did not show any emergent findings. -Neck MRA showed evidence of flow reducing stenosis of the right more than left proximal ICA.  Recommended CTA.  If significant, may need vascular surgery involvement. -A1c 9.1, LDL 120 -Prior to admission, patient  was not aspirin 81 mg daily, Zetia 10 mg daily, Crestor 40 mg daily.  Currently continue the same -PT eval obtained.  SNF recommended  Bilateral carotid artery stenosis. -CTA head and neck showed soft plaque in the proximal right ICA resulting in 80 to 90% stenosis, mixed plaque in the proximal left ICA resulting in 60% stenosis.  Mild atherosclerotic plaque in the intracranial ICAs without significant stenosis.  Otherwise patent intracranial vasculature. -Vascular surgery consultation obtained.  Noted to plantar TCAR on on the left.  Initially planned on 1/30 but because of OR unavailability, it has been rescheduled for 2/1.  Hypokalemia/hypomagnesemia -Potassium continues to remain low despite replacement.  2.7 today.  IV and oral replacement given.  HCTZ on hold.  Aldactone dose increased.  Magnesium normal at 2. -Continue to monitor. Recent Labs  Lab 09/17/21 0404 09/17/21 0725 09/18/21 0409 09/20/21 0400 09/21/21 0410 09/21/21 0913  K 2.7* 3.0* 3.5 2.5* 2.7*  --   MG 1.4*  --   --  1.8 1.9 2.0  PHOS  --   --   --   --   --  3.1     Essential hypertension -Permissive hypertension allowed for now -Home meds include olmesartan 20 mg daily, Aldactone 25 mg daily, HCTZ 25 mg daily -Because of hypokalemia, HCTZ has been stopped.  Aldactone dose increased.  Blood pressure in normal range.  If it continues to rise, will resume olmesartan. -Continue monitor blood pressure.  Uncontrolled type 2 diabetes mellitus with hyperglycemia -A1c 9.7 on 09/17/2021 -Home meds include insulin pump and Jardiance.  Currently insulin pump is off. -Patient is on Semglee and short-acting insulin.  Blood sugar better  after Semglee dose was increased yesterday.  Currently on 40 units daily.  Also continue scheduled and sliding scale Premeal insulin.  Continue to monitor Recent Labs  Lab 09/20/21 1534 09/20/21 2118 09/21/21 0249 09/21/21 0551 09/21/21 0623  GLUCAP 296* 191* 178* 160* 152*     Hyperlipidemia Continue rosuvastatin and Zetia.  Myasthenia gravis -Follows with [place] neurology.   -CellCept recently held due to diarrhea which has since resolved.   -Continue chronic prednisone 10 mg daily.  -She receives Rituxan and IVIG infusions.  Weekly through [facility].   Hypothyroidism Continue Synthroid.    OSA Continue CPAP nightly.  Headache -Tramadol as needed  Constipation -MiraLAX as needed    Mobility: Encourage ambulation Living condition: Lives at home Goals of care:   Code Status: Full Code  Nutritional status: Body mass index is 27.83 kg/m.      Diet:  Diet Order             Diet heart healthy/carb modified Room service appropriate? Yes; Fluid consistency: Thin  Diet effective now                  DVT prophylaxis:  enoxaparin (LOVENOX) injection 40 mg Start: 09/17/21 0800   Antimicrobials: None Fluid: None Consultants: Neurology Family Communication: None at bedside  Status is: Observation  Continue in-hospital care because: Pending TCAR inpatient versus outpatient.  Patient feels weak and unable to go back to living by self.  SNF recommended.  Probably SNF after surgery. Level of care: Telemetry Medical   Dispo: The patient is from: Home              Anticipated d/c is to: SNF after surgery              Patient currently is not medically stable to d/c.   Difficult to place patient No     Infusions:     Scheduled Meds:  aspirin EC  325 mg Oral Daily   Chlorhexidine Gluconate Cloth  6 each Topical Daily   clopidogrel  75 mg Oral Daily   cycloSPORINE  1 drop Both Eyes BID   enoxaparin (LOVENOX) injection  40 mg Subcutaneous Q24H   ezetimibe  10 mg Oral Daily   insulin aspart  0-15 Units Subcutaneous TID WC   insulin aspart  0-5 Units Subcutaneous QHS   insulin aspart  5 Units Subcutaneous TID WC   insulin glargine-yfgn  40 Units Subcutaneous Daily   levothyroxine  150 mcg Oral QAC breakfast   montelukast  10 mg Oral  BH-q7a   polyethylene glycol  17 g Oral Daily   potassium chloride  40 mEq Oral Q2H   predniSONE  10 mg Oral BH-q7a   rosuvastatin  40 mg Oral Daily   senna-docusate  2 tablet Oral BID   spironolactone  50 mg Oral Daily   topiramate  50 mg Oral QHS    PRN meds: acetaminophen **OR** acetaminophen (TYLENOL) oral liquid 160 mg/5 mL **OR** acetaminophen, albuterol, ondansetron (ZOFRAN) IV, sodium chloride flush, traMADol   Antimicrobials: Anti-infectives (From admission, onward)    None       Objective: Vitals:   09/21/21 0323 09/21/21 0807  BP: 140/61 (!) 116/59  Pulse: 81 76  Resp: 18 14  Temp: 97.9 F (36.6 C) 98 F (36.7 C)  SpO2: 96% 94%   No intake or output data in the 24 hours ending 09/21/21 1027  Filed Weights   09/16/21 1525  Weight: 83 kg   Weight change:  Body mass index is 27.83 kg/m.   Physical Exam: General exam: Pleasant elderly African-American female.  Not in physical distress.   Skin: No rashes, lesions or ulcers. HEENT: Atraumatic, normocephalic, no obvious bleeding Lungs: Clear to auscultate bilaterally CVS: Regular rate and rhythm, no murmur GI/Abd soft, nontender, nondistended, bowel sound present CNS: Alert, awake, oriented x3 Psychiatry: Mood appropriate Extremities: No pedal edema, no calf tenderness  Data Review: I have personally reviewed the laboratory data and studies available.  F/u labs ordered Unresulted Labs (From admission, onward)     Start     Ordered   09/22/21 8102  Basic metabolic panel  Daily,   R     Question:  Specimen collection method  Answer:  Unit=Unit collect   09/21/21 0810            Signed, Terrilee Croak, MD [facility] 09/21/2021

## 2021-09-21 NOTE — Progress Notes (Signed)
°   09/21/21 0446  Provider Notification  Provider Name/Title Dr Tonie Griffith  Date Provider Notified 09/21/21  Time Provider Notified [phone]  Notification Type Page  Notification Reason Critical result  Test performed and critical result Potassium level of 2.7  Date Critical Result Received 09/21/21  Time Critical Result Received 0444  Provider response Other (Comment) (awaiting response)  Date of Provider Response 09/21/21 (with new orders)  Time of Provider Response [id number]

## 2021-09-22 LAB — GLUCOSE, CAPILLARY
Glucose-Capillary: 171 mg/dL — ABNORMAL HIGH (ref 70–99)
Glucose-Capillary: 398 mg/dL — ABNORMAL HIGH (ref 70–99)
Glucose-Capillary: 375 mg/dL — ABNORMAL HIGH (ref 70–99)
Glucose-Capillary: 155 mg/dL — ABNORMAL HIGH (ref 70–99)

## 2021-09-22 LAB — BASIC METABOLIC PANEL WITH GFR
Anion gap: 7 (ref 5–15)
BUN: 15 mg/dL (ref 8–23)
CO2: 27 mmol/L (ref 22–32)
Calcium: 8.5 mg/dL — ABNORMAL LOW (ref 8.9–10.3)
Chloride: 104 mmol/L (ref 98–111)
Creatinine, Ser: 1.06 mg/dL — ABNORMAL HIGH (ref 0.44–1.00)
GFR, Estimated: 57 mL/min — ABNORMAL LOW
Glucose, Bld: 191 mg/dL — ABNORMAL HIGH (ref 70–99)
Potassium: 3.4 mmol/L — ABNORMAL LOW (ref 3.5–5.1)
Sodium: 138 mmol/L (ref 135–145)

## 2021-09-22 MED ORDER — POTASSIUM CHLORIDE CRYS ER 20 MEQ PO TBCR
40.0000 meq | EXTENDED_RELEASE_TABLET | Freq: Once | ORAL | Status: AC
  Administered 2021-09-22: 40 meq via ORAL
  Filled 2021-09-22: qty 2

## 2021-09-22 MED ORDER — INSULIN GLARGINE-YFGN 100 UNIT/ML ~~LOC~~ SOLN
45.0000 [IU] | Freq: Every day | SUBCUTANEOUS | Status: DC
  Administered 2021-09-22 – 2021-09-24 (×3): 45 [IU] via SUBCUTANEOUS
  Filled 2021-09-22 (×4): qty 0.45

## 2021-09-22 NOTE — Progress Notes (Signed)
Occupational Therapy Treatment Patient Details Name: [name] MRN: [id number] DOB: Nov 18, 1950 Today's Date: 09/22/2021   History of present illness Pt is a 71 y.o. female who presented with chest pain, right facial numbness and arm numbness 09/16/21, MRI/MRA 1/25 negative for acute intracranial changes with evidence of flow reducing stenosis at the right more than left proximal ICA. CT 1/26 negative for acute changes. PMH significant for seronegative myasthenia gravis, hypertension, asymptomatic carotid artery stenosis, Prior stroke in April 2021, Paraneoplastic retinopathy.   OT comments  Pt continues to make good progress with all basic adls and adl transfers.  Pt overall requiring min assist with basic adls. Pt lives alone and feel pt could reach the mod I level of care she needs to return home living alone if she can get inpt rehab.  Pt's sister takes her to get groceries and will be available to check in on pt frequently.  Pt needs more work with home skills and IADLS on rehab.  Pt will have procedure on Wed and OT to reeval afterward.    Recommendations for follow up therapy are one component of a multi-disciplinary discharge planning process, led by the attending physician.  Recommendations may be updated based on patient status, additional functional criteria and insurance authorization.    Follow Up Recommendations  Acute inpatient rehab (3hours/day)    Assistance Recommended at Discharge Intermittent Supervision/Assistance  Patient can return home with the following  A little help with walking and/or transfers;A little help with bathing/dressing/bathroom;Assist for transportation;Help with stairs or ramp for entrance;Direct supervision/assist for medications management   Equipment Recommendations  None recommended by OT    Recommendations for Other Services Rehab consult    Precautions / Restrictions Precautions Precautions: Fall Precaution Comments: Double  vision Restrictions Weight Bearing Restrictions: No       Mobility Bed Mobility               General bed mobility comments: Pt in chair on arrival    Transfers Overall transfer level: Needs assistance Equipment used: Rolling walker (2 wheels) Transfers: Sit to/from Stand Sit to Stand: Min guard           General transfer comment: min guard to power up slowly and cues to reach back to chair when sitting back down.     Balance Overall balance assessment: Needs assistance Sitting-balance support: Feet supported Sitting balance-Leahy Scale: Good     Standing balance support: During functional activity, Bilateral upper extremity supported, Reliant on assistive device for balance Standing balance-Leahy Scale: Poor Standing balance comment: Pt can stand at sink for short amounts of tie without assist but when walking or dynamically standing, pt requires walker.                           ADL either performed or assessed with clinical judgement   ADL Overall ADL's : Needs assistance/impaired Eating/Feeding: Independent;Sitting Eating/Feeding Details (indicate cue type and reason): pt opened packages with no assist; just extra time Grooming: Wash/dry hands;Wash/dry face;Oral care;Supervision/safety;Standing Grooming Details (indicate cue type and reason): Pt groom at sink with no hands on assist. Pt does fatigue but no physical assist needed.                 Toilet Transfer: Min guard;Rolling walker (2 wheels);Ambulation;Grab Information systems manager Details (indicate cue type and reason): Pt walked to bathroom with walker and only needed min guard to safety lower self to commode.  No  physical assist needed. Toileting- Clothing Manipulation and Hygiene: Supervision/safety;Sitting/lateral lean       Functional mobility during ADLs: Min guard;Rolling walker (2 wheels) General ADL Comments: Pt becoming more consistent with completing adls with  less assist. Pt with tremor in R hand affecting use of RUE.  Supervision to min guard given when pt in standing.    Extremity/Trunk Assessment Upper Extremity Assessment Upper Extremity Assessment: RUE deficits/detail RUE Deficits / Details: grip strength 3+/5, tremor noted RUE Sensation: decreased light touch RUE Coordination: decreased fine motor;decreased gross motor   Lower Extremity Assessment Lower Extremity Assessment: Defer to PT evaluation        Vision   Vision Assessment?: Vision impaired- to be further tested in functional context;Yes Eye Alignment: Within Functional Limits Diplopia Assessment: Objects split side to side;Present all the time/all directions Additional Comments: Pt has had double vision for years.  Pt states her vision seems the same as it was a few months ago.   Perception     Praxis Praxis Praxis: Intact    Cognition Arousal/Alertness: Awake/alert Behavior During Therapy: WFL for tasks assessed/performed Overall Cognitive Status: Within Functional Limits for tasks assessed                                          Exercises      Shoulder Instructions       General Comments Pt making good progress with safety during adls.    Pertinent Vitals/ Pain       Pain Assessment Pain Assessment: 0-10 Pain Score: 3  Pain Location: headache Pain Descriptors / Indicators: Discomfort Pain Intervention(s): Monitored during session  Home Living                                          Prior Functioning/Environment              Frequency  Min 2X/week        Progress Toward Goals  OT Goals(current goals can now be found in the care plan section)  Progress towards OT goals: Progressing toward goals  Acute Rehab OT Goals Patient Stated Goal: to be able to be at home independently OT Goal Formulation: With patient Time For Goal Achievement: 10/01/21 Potential to Achieve Goals: Good ADL Goals Pt Will  Perform Grooming: with modified independence;standing Pt Will Perform Lower Body Bathing: with modified independence;sit to/from stand Pt Will Perform Lower Body Dressing: with modified independence;sit to/from stand Pt Will Transfer to Toilet: with modified independence;ambulating;regular height toilet;grab bars Additional ADL Goal #1: Pt will indep verbalize at least 3 fall prevention strategies to apply to the home setting  Plan Discharge plan remains appropriate    Co-evaluation                 AM-PAC OT "6 Clicks" Daily Activity     Outcome Measure   Help from another person eating meals?: None Help from another person taking care of personal grooming?: A Little Help from another person toileting, which includes using toliet, bedpan, or urinal?: A Little Help from another person bathing (including washing, rinsing, drying)?: A Little Help from another person to put on and taking off regular upper body clothing?: None Help from another person to put on and taking off regular lower body clothing?: A Little  6 Click Score: 20    End of Session Equipment Utilized During Treatment: Gait belt;Rolling walker (2 wheels)  OT Visit Diagnosis: Unsteadiness on feet (R26.81);Other abnormalities of gait and mobility (R26.89);Muscle weakness (generalized) (M62.81);Pain;Hemiplegia and hemiparesis Hemiplegia - Right/Left: Right Hemiplegia - dominant/non-dominant: Dominant Hemiplegia - caused by: Unspecified Pain - part of body:  (head)   Activity Tolerance Patient tolerated treatment well   Patient Left in chair;with call bell/phone within reach;with nursing/sitter in room   Nurse Communication Mobility status        Time: 3685-9923 OT Time Calculation (min): 14 min  Charges: OT General Charges $OT Visit: 1 Visit   Glenford Peers 09/22/2021, 10:57 AM

## 2021-09-22 NOTE — Progress Notes (Signed)
Inpatient Diabetes Program Recommendations  AACE/ADA: New Consensus Statement on Inpatient Glycemic Control (2015)  Target Ranges:  Prepandial:   less than 140 mg/dL      Peak postprandial:   less than 180 mg/dL (1-2 hours)      Critically ill patients:  140 - 180 mg/dL   Lab Results  Component Value Date   GLUCAP 171 (H) 09/22/2021   HGBA1C 9.7 (H) 09/17/2021    Review of Glycemic Control  Latest Reference Range & Units 09/21/21 06:23 09/21/21 11:51 09/21/21 16:53 09/21/21 21:58 09/22/21 06:16  Glucose-Capillary 70 - 99 mg/dL 152 (H) 227 (H) 297 (H) 140 (H) 171 (H)  (H): Data is abnormally high  Inpatient Diabetes Program Recommendations:    Please consider increasing meal coverage-Novolog  8 units TID with meals eats eats at least 50%  Will continue to follow while inpatient.  Thank you, Reche Dixon, MSN, RN Diabetes Coordinator Inpatient Diabetes Program [phone] (team pager from 8a-5p)

## 2021-09-22 NOTE — Progress Notes (Signed)
PROGRESS NOTE  [name] [name]  DOB: 1951-02-16  PCP: [name] Morning, DO ZOX:[id number]  DOA: 09/16/2021  LOS: 3 days  Hospital Day: 7  Chief Complaint  Patient presents with   Chest Pain   Brief narrative: [name] is a 71 y.o. 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  She felt similar to when she had previous stroke and hence came to the ED. Of note, patient recently had diarrhea.  Her neurologist at [facility] advised her to hold CellCept for 10 days after which diarrhea resolved.  In the ED, her blood pressure was elevated to 173/70 CT head was negative. Serum glucose was elevated to 239. SARS-CoV-2 and influenza PCR negative. Chest x-ray negative for focal consolidation, edema, effusion. CT head without contrast negative for acute intracranial findings. Admitted to hospitalist service Neurology consulted.  Subjective: Patient was seen and examined this [name].  Sitting up in chair.  Not in distress. Complains that she is starting to bleed a little bit from the site of Lovenox shots  Assessment/Plan: Right facial numbness History of left thalamic infarct with mild right-sided residual weakness -Admitted for stroke work-up.   -Neurology consult appreciated.  Also need to consider complex migraine versus psychogenic presentation.   -CT head and MRI brain unremarkable. -intracranial MRA did not show any emergent findings. -Neck MRA showed evidence of flow reducing stenosis of the right more than left proximal ICA.  Recommended CTA.  If significant, may need vascular surgery involvement. -A1c 9.1, LDL 120 -Prior  to admission, patient was not aspirin 81 mg daily, Zetia 10 mg daily, Crestor 40 mg daily.  Currently continue the same -PT eval obtained.  SNF recommended  Bilateral carotid artery stenosis. -CTA head and neck showed soft plaque in the proximal right ICA resulting in 80 to 90% stenosis, mixed plaque in the proximal left ICA resulting in 60% stenosis.  Mild atherosclerotic plaque in the intracranial ICAs without significant stenosis.  Otherwise patent intracranial vasculature. -Vascular surgery consultation obtained.  Noted to plantar TCAR on on the left.  Initially planned on 1/30 but because of OR unavailability, it has been rescheduled for 2/1 Wednesday.  Hypokalemia/hypomagnesemia -Potassium improved to 3.4 today.  HCTZ on hold.  Aldactone dose was increased.  Monitor potassium chloride ordered for this [name].   -Continue to monitor. Recent Labs  Lab 09/17/21 0404 09/17/21 0725 09/18/21 0409 09/20/21 0400 09/21/21 0410 09/21/21 0913 09/22/21 0406  K 2.7* 3.0* 3.5 2.5* 2.7*  --  3.4*  MG 1.4*  --   --  1.8 1.9 2.0  --   PHOS  --   --   --   --   --  3.1  --     Essential hypertension -Permissive hypertension allowed for now -Home meds include olmesartan 20 mg daily, Aldactone 25 mg daily, HCTZ 25 mg daily -Because of hypokalemia, HCTZ has been stopped.  Aldactone dose increased.  Blood pressure in normal range.  If BP starts to rise, will resume olmesartan. -Continue monitor blood pressure.  Uncontrolled type 2 diabetes mellitus with hyperglycemia -A1c 9.7 on 09/17/2021 -Home meds include insulin pump and Jardiance.  Currently insulin pump is off. -Patient is on [facility]  and short-acting insulin.  He was being adjusted based on blood sugar response.  Increased Semglee to 45 units today.  Continue scheduled and sliding scale Premeal insulin.  Continue to monitor Recent Labs  Lab 09/21/21 0623 09/21/21 1151 09/21/21 1653 09/21/21 2158 09/22/21 0616  GLUCAP 152* 227* 297* 140*  171*   Hyperlipidemia Continue rosuvastatin and Zetia.  Myasthenia gravis -Follows with [place] neurology.   -CellCept recently held due to diarrhea which has since resolved.   -Continue chronic prednisone 10 mg daily.  -She receives Rituxan and IVIG infusions.  Weekly through [facility].   Hypothyroidism Continue Synthroid.    OSA Continue CPAP nightly.  Headache -Tramadol as needed  Constipation -MiraLAX as needed    Mobility: Encourage ambulation Living condition: Lives at home Goals of care:   Code Status: Full Code  Nutritional status: Body mass index is 27.83 kg/m.      Diet:  Diet Order             Diet heart healthy/carb modified Room service appropriate? Yes; Fluid consistency: Thin  Diet effective now                  DVT prophylaxis: States that she is starting to bleed from the site of Lovenox injection.  Switched to SCDs today Place and maintain sequential compression device Start: 09/22/21 1028   Antimicrobials: None Fluid: None Consultants: Neurology Family Communication: None at bedside  Status is: Observation  Continue in-hospital care because: Pending TCAR on 2/1.  Needs PT eval after the procedure again Level of care: Telemetry Medical   Dispo: The patient is from: Home              Anticipated d/c is to: SNF after surgery              Patient currently is not medically stable to d/c.   Difficult to place patient No     Infusions:     Scheduled Meds:  aspirin EC  325 mg Oral Daily   Chlorhexidine Gluconate Cloth  6 each Topical Daily   clopidogrel  75 mg Oral Daily   cycloSPORINE  1 drop Both Eyes BID   ezetimibe  10 mg Oral Daily   insulin aspart  0-15 Units Subcutaneous TID WC   insulin aspart  0-5 Units Subcutaneous QHS   insulin aspart  5 Units Subcutaneous TID WC   insulin glargine-yfgn  45 Units Subcutaneous Daily   levothyroxine  150 mcg Oral QAC breakfast   montelukast  10 mg Oral BH-q7a   polyethylene glycol  17 g  Oral Daily   potassium chloride  40 mEq Oral Once   predniSONE  10 mg Oral BH-q7a   rosuvastatin  40 mg Oral Daily   senna-docusate  2 tablet Oral BID   spironolactone  50 mg Oral Daily   topiramate  50 mg Oral QHS    PRN meds: acetaminophen **OR** acetaminophen (TYLENOL) oral liquid 160 mg/5 mL **OR** acetaminophen, albuterol, ondansetron (ZOFRAN) IV, sodium chloride flush, traMADol   Antimicrobials: Anti-infectives (From admission, onward)    None       Objective: Vitals:   09/22/21 0353 09/22/21 0738  BP: (!) 109/53 118/60  Pulse: 81 79  Resp:  16  Temp: 99.1 F (37.3 C) 98.3 F (36.8 C)  SpO2: 95% 97%    Intake/Output Summary (Last 24 hours) at 09/22/2021 1029 Last data filed at 09/21/2021 2210 Gross per 24 hour  Intake 240 ml  Output --  Net 240 ml   Filed Weights   09/16/21 1525  Weight: 83 kg   Weight change:  Body mass index is 27.83 kg/m.   Physical Exam: General exam: Pleasant elderly African-American female.  Not in physical distress Skin: No rashes, lesions or ulcers. HEENT: Atraumatic, normocephalic, no obvious bleeding Lungs: Clear to auscultate bilaterally CVS: Regular rate and rhythm, no murmur GI/Abd soft, nontender, nondistended, bowel sound present CNS: Alert, awake, oriented x3 Psychiatry: Mood appropriate Extremities: No pedal edema, no calf tenderness  Data Review: I have personally reviewed the laboratory data and studies available.  F/u labs ordered Unresulted Labs (From admission, onward)     Start     Ordered   09/22/21 8206  Basic metabolic panel  Daily,   R     Question:  Specimen collection method  Answer:  Unit=Unit collect   09/21/21 0810            Signed, Terrilee Croak, MD [facility] 09/22/2021

## 2021-09-22 NOTE — Progress Notes (Signed)
Seen on morning rounds. Doing well. No new issues from a neurologic standpoint. Reviewed details and rationale for TCAR again with patient.  She is understanding. Plan Left TCAR Wednesday 09/24/21.   Yevonne Aline. Stanford Breed, MD [facility] Phone Number: [phone] 09/22/2021 8:30 AM

## 2021-09-22 NOTE — Care Management Important Message (Signed)
Important Message  Patient Details  Name: [name] MRN: [id number] Date of Birth: [birth year]   Medicare Important Message Given:  Yes     Orbie Pyo 09/22/2021, 1:58 PM

## 2021-09-23 LAB — BASIC METABOLIC PANEL
Anion gap: 5 (ref 5–15)
BUN: 17 mg/dL (ref 8–23)
CO2: 24 mmol/L (ref 22–32)
Calcium: 8.6 mg/dL — ABNORMAL LOW (ref 8.9–10.3)
Chloride: 109 mmol/L (ref 98–111)
Creatinine, Ser: 1.17 mg/dL — ABNORMAL HIGH (ref 0.44–1.00)
GFR, Estimated: 51 mL/min — ABNORMAL LOW (ref >60)
Glucose, Bld: 151 mg/dL — ABNORMAL HIGH (ref 70–99)
Potassium: 3.6 mmol/L (ref 3.5–5.1)
Sodium: 138 mmol/L (ref 135–145)

## 2021-09-23 LAB — GLUCOSE, CAPILLARY
Glucose-Capillary: 151 mg/dL — ABNORMAL HIGH (ref 70–99)
Glucose-Capillary: 275 mg/dL — ABNORMAL HIGH (ref 70–99)
Glucose-Capillary: 276 mg/dL — ABNORMAL HIGH (ref 70–99)
Glucose-Capillary: 189 mg/dL — ABNORMAL HIGH (ref 70–99)

## 2021-09-23 LAB — SURGICAL PCR SCREEN
MRSA, PCR: NEGATIVE
Staphylococcus aureus: NEGATIVE

## 2021-09-23 MED ORDER — CIPROFLOXACIN HCL 0.3 % OP SOLN
2.0000 [drp] | OPHTHALMIC | Status: DC
  Administered 2021-09-23 – 2021-09-27 (×18): 2 [drp] via OPHTHALMIC
  Filled 2021-09-23 (×2): qty 2.5

## 2021-09-23 MED ORDER — CEFAZOLIN SODIUM-DEXTROSE 2-4 GM/100ML-% IV SOLN
2.0000 g | INTRAVENOUS | Status: AC
  Administered 2021-09-24: 2 g via INTRAVENOUS
  Filled 2021-09-23: qty 100

## 2021-09-23 NOTE — Progress Notes (Signed)
Inpatient Rehab Admissions Coordinator:   Per therapy recommendations, patient was screened for CIR candidacy by Clemens Catholic, MS, CCC-SLP. At this time, Pt. does not appear to demonstrate medical necessity to justify in hospital rehabilitation/CIR or to obtain insurance auth with Pt.'s payor. I will not pursue a rehab consult for this Pt.   Recommend other rehab venues to be pursued.  Please contact me with any questions.  Clemens Catholic, [place], [place] Admissions Coordinator  [phone] ([place]) [phone] (office)

## 2021-09-23 NOTE — TOC Progression Note (Signed)
Transition of Care [facility]) - Progression Note    Patient Details  Name: [name] MRN: [id number] Date of Birth: 1951/07/17  [facility]) CM/SW Contact  Pollie Friar, RN Phone Number: 09/23/2021, 11:07 AM  Clinical Narrative:    CM met with the patient to discuss discharge plans. She is unhappy about CIR not thinking her insurance will pay for CIR stay. She is asking that [facility] IR review her information. Pt is still refusing a SNF rehab.  TOC following.   Expected Discharge Plan: IP Rehab Facility Barriers to Discharge: Continued Medical Work up  Expected Discharge Plan and Services Expected Discharge Plan: [place]   Discharge Planning Services: CM Consult Post Acute Care Choice: IP Rehab Living arrangements for the past 2 months: Single Family Home                                       Social Determinants of Health (SDOH) Interventions    Readmission Risk Interventions No flowsheet data found.

## 2021-09-23 NOTE — Progress Notes (Addendum)
Talked with pt this morning about surgery tomorrow and no questions about surgery at this time.   Continues to have right sided facial numbness and right hand numbness.  Continues work with PT.   Discussed with her not to eat or drink after MN.   Consent/labs/npo after MN orders placed.  Leontine Locket, [facility] 09/23/2021 7:42 AM  VASCULAR STAFF ADDENDUM: I have independently interviewed and examined the patient. I agree with the above.  Reviewed risks / benefits / alternatives to TCAR today. She is in agreement. Plan LEFT TCAR tomorrow in OR.  Yevonne Aline. Stanford Breed, MD [facility] Phone Number: [phone] 09/23/2021 8:52 AM

## 2021-09-23 NOTE — Progress Notes (Signed)
PROGRESS NOTE  [name]  DOB: October 09, 1950  PCP: [name] Morning, DO BHA:[id number]  DOA: 09/16/2021  LOS: 4 days  Hospital Day: 8  Chief Complaint  Patient presents with   Chest Pain   Brief narrative: [name] is a 71 y.o. 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  She felt similar to when she had previous stroke and hence came to the ED. Of note, patient recently had diarrhea.  Her neurologist at [facility] advised her to hold CellCept for 10 days after which diarrhea resolved.  In the ED, her blood pressure was elevated to 173/70 CT head was negative. Serum glucose was elevated to 239. SARS-CoV-2 and influenza PCR negative. Chest x-ray negative for focal consolidation, edema, effusion. CT head without contrast negative for acute intracranial findings. Admitted to hospitalist service Neurology consulted.  Subjective: Patient was seen and examined this [name].  Sitting up in chair.  Not in distress.  No new symptoms. Waiting for carotid surgery tomorrow.  Assessment/Plan: Right facial numbness History of left thalamic infarct with mild right-sided residual weakness -Admitted for stroke work-up.   -Neurology consult appreciated.  Also need to consider complex migraine versus psychogenic presentation.   -CT head and MRI brain unremarkable. -A1c 9.1, LDL 120 -Prior to admission, patient was not aspirin 81 mg daily, Zetia 10 mg daily, Crestor 40 mg daily.  Currently continue the same -PT eval obtained.  SNF recommended  Bilateral carotid artery stenosis. -CTA head and neck showed soft plaque in the proximal  right ICA resulting in 80 to 90% stenosis, mixed plaque in the proximal left ICA resulting in 60% stenosis.  Mild atherosclerotic plaque in the intracranial ICAs without significant stenosis.  Otherwise patent intracranial vasculature. -Vascular surgery consultation obtained.  Noted to plantar TCAR on on the left.  Initially planned on 1/30 but because of OR unavailability, it has been rescheduled for 2/1 Wednesday.  Hypokalemia/hypomagnesemia -Potassium and magnesium level were low and improved with replacement.  Currently sitting on hold.  Aldactone dose was increased.  Electrolytes normalized. -Continue to monitor. Recent Labs  Lab 09/17/21 0404 09/17/21 0725 09/18/21 0409 09/20/21 0400 09/21/21 0410 09/21/21 0913 09/22/21 0406 09/23/21 0445  K 2.7*   < > 3.5 2.5* 2.7*  --  3.4* 3.6  MG 1.4*  --   --  1.8 1.9 2.0  --   --   PHOS  --   --   --   --   --  3.1  --   --    < > = values in this interval not displayed.     Essential hypertension -Permissive hypertension allowed for now -Home meds include olmesartan 20 mg daily, Aldactone 25 mg daily, HCTZ 25 mg daily -Because of hypokalemia, HCTZ has been stopped.  Aldactone dose increased.  Blood pressure in normal range.  If BP starts to rise, will resume olmesartan. -Continue monitor blood pressure.  Uncontrolled type 2 diabetes mellitus with hyperglycemia -A1c 9.7 on 09/17/2021 -Home meds include insulin pump and Jardiance.  Currently insulin pump is off. -Patient is on Semglee and short-acting insulin. It is being adjusted based on blood sugar response.  Currently on Semglee to 45 units  today and sliding scale Premeal insulin.  Continue to monitor Recent Labs  Lab 09/22/21 1155 09/22/21 1626 09/22/21 2212 09/23/21 0611 09/23/21 1202  GLUCAP 398* 375* 155* 151* 275*    Hyperlipidemia Continue rosuvastatin and Zetia.  Myasthenia gravis -Follows with [facility] neurology.   -CellCept recently held due to diarrhea which has  since resolved.   -Continue chronic prednisone 10 mg daily.  -She receives Rituxan and IVIG infusions.  Weekly through [place].   Hypothyroidism Continue Synthroid.    OSA Continue CPAP nightly.  Headache -Tramadol as needed  Constipation -MiraLAX as needed    Mobility: Encourage ambulation Living condition: Lives at home Goals of care:   Code Status: Full Code  Nutritional status: Body mass index is 27.83 kg/m.      Diet:  Diet Order             Diet NPO time specified Except for: Sips with Meds  Diet effective midnight           Diet heart healthy/carb modified Room service appropriate? Yes; Fluid consistency: Thin  Diet effective now                  DVT prophylaxis: States that she is starting to bleed from the site of Lovenox injection.  Switched to SCDs today Place and maintain sequential compression device Start: 09/22/21 1028   Antimicrobials: None Fluid: None Consultants: Neurology Family Communication: None at bedside  Status is: Observation  Continue in-hospital care because: Pending TCAR on 2/1.  Needs PT eval after the procedure again Level of care: Telemetry Medical   Dispo: The patient is from: Home              Anticipated d/c is to: SNF/CIR after surgery              Patient currently is not medically stable to d/c.   Difficult to place patient No     Infusions:   [START ON 09/24/2021]  ceFAZolin (ANCEF) IV       Scheduled Meds:  aspirin EC  325 mg Oral Daily   Chlorhexidine Gluconate Cloth  6 each Topical Daily   clopidogrel  75 mg Oral Daily   cycloSPORINE  1 drop Both Eyes BID   ezetimibe  10 mg Oral Daily   insulin aspart  0-15 Units Subcutaneous TID WC   insulin aspart  0-5 Units Subcutaneous QHS   insulin aspart  5 Units Subcutaneous TID WC   insulin glargine-yfgn  45 Units Subcutaneous Daily   levothyroxine  150 mcg Oral QAC breakfast   montelukast  10 mg Oral BH-q7a   polyethylene glycol  17 g Oral Daily   predniSONE   10 mg Oral BH-q7a   rosuvastatin  40 mg Oral Daily   senna-docusate  2 tablet Oral BID   spironolactone  50 mg Oral Daily   topiramate  50 mg Oral QHS    PRN meds: acetaminophen **OR** acetaminophen (TYLENOL) oral liquid 160 mg/5 mL **OR** acetaminophen, albuterol, ondansetron (ZOFRAN) IV, sodium chloride flush, traMADol   Antimicrobials: Anti-infectives (From admission, onward)    Start     Dose/Rate Route Frequency Ordered Stop   09/24/21 0600  ceFAZolin (ANCEF) IVPB 2g/100 mL premix       Note to Pharmacy: Send with pt to OR   2 g 200 mL/hr over 30 Minutes Intravenous On call 09/23/21 0651 09/25/21 0600       Objective: Vitals:   09/23/21 0745 09/23/21 1200  BP:  118/60 (!) 130/54  Pulse: 76 93  Resp: 16 20  Temp: 97.9 F (36.6 C) 98.4 F (36.9 C)  SpO2: 98% 99%    Intake/Output Summary (Last 24 hours) at 09/23/2021 1259 Last data filed at 09/22/2021 1700 Gross per 24 hour  Intake 240 ml  Output --  Net 240 ml    Filed Weights   09/16/21 1525  Weight: 83 kg   Weight change:  Body mass index is 27.83 kg/m.   Physical Exam: General exam: Pleasant elderly African-American female.  Not in physical distress Skin: No rashes, lesions or ulcers. HEENT: Atraumatic, normocephalic, no obvious bleeding Lungs: Clear to auscultation bilaterally CVS: Regular rate and rhythm, no murmur GI/Abd soft, nontender, nondistended, bowel sound present CNS: Alert, awake, oriented x3 Psychiatry: Mood appropriate Extremities: No pedal edema, no calf tenderness  Data Review: I have personally reviewed the laboratory data and studies available.  F/u labs ordered Unresulted Labs (From admission, onward)     Start     Ordered   09/24/21 9147  Basic metabolic panel  Tomorrow [name],   R       Question:  Specimen collection method  Answer:  Unit=Unit collect   09/23/21 0651   09/24/21 0500  CBC  Tomorrow [name],   R       Question:  Specimen collection method  Answer:   Unit=Unit collect   09/23/21 0651   09/22/21 8295  Basic metabolic panel  Daily,   R     Question:  Specimen collection method  Answer:  Unit=Unit collect   09/21/21 0810            Signed, Terrilee Croak, MD [facility] 09/23/2021

## 2021-09-23 NOTE — Progress Notes (Signed)
RT Note: CPAP set up at bedside within patient reach. Patient states she is able to place herself on/off as needed. Patient aware to call for assistance if needed.

## 2021-09-23 NOTE — Progress Notes (Signed)
Physical Therapy Treatment Patient Details Name: [name] MRN: [id number] DOB: [birth year] Today's Date: 09/23/2021   History of Present Illness Pt is a 71 y.o. female who presented with chest pain, right facial numbness and arm numbness 09/16/21, MRI/MRA 1/25 negative for acute intracranial changes with evidence of flow reducing stenosis at the right more than left proximal ICA. CT 1/26 negative for acute changes. PMH significant for seronegative myasthenia gravis, hypertension, asymptomatic carotid artery stenosis, Prior stroke in April 2021, Paraneoplastic retinopathy.    PT Comments    Pt reports feeling better today fatigue-wise vs yesterday. Pt overall mobilizing min guard assist level, but does require mod cuing for safe gait. Pt struggles with R foot clearance, requires cuing for increasing foot clearance and benefits from targeted intervention focusing on hip and knee flexion. PT feels pt would be an excellent inpatient rehab candidate given deficits vs baseline, will continue to follow.      Recommendations for follow up therapy are one component of a multi-disciplinary discharge planning process, led by the attending physician.  Recommendations may be updated based on patient status, additional functional criteria and insurance authorization.  Follow Up Recommendations  Acute inpatient rehab (3hours/day) ([facility] vs [facility])     Assistance Recommended at Discharge Intermittent Supervision/Assistance  Patient can return home with the following A little help with walking and/or transfers;Assist for transportation;Help with stairs or ramp for entrance;Assistance with cooking/housework   Equipment Recommendations  None recommended by PT    Recommendations for Other Services Rehab consult     Precautions / Restrictions Precautions Precautions: Fall Precaution Comments: diplopia (not assessed this session) Restrictions Weight Bearing Restrictions: No     Mobility  Bed  Mobility Overal bed mobility: Needs Assistance Bed Mobility: Supine to Sit, Sit to Supine     Supine to sit: Supervision Sit to supine: Supervision   General bed mobility comments: use of bedrail, increased time, and HOB elevation    Transfers Overall transfer level: Needs assistance Equipment used: Rolling walker (2 wheels) Transfers: Sit to/from Stand Sit to Stand: Supervision           General transfer comment: for safety only, cues for slow eccentric lower when moving stand>sit and hand placement when rising/sitting. STS x4, from EOB x3 and x1 from toilet    Ambulation/Gait Ambulation/Gait assistance: Min guard Gait Distance (Feet): 100 Feet Assistive device: Rolling walker (2 wheels) Gait Pattern/deviations: Step-through pattern, Decreased stride length, Trunk flexed, Decreased dorsiflexion - right, Decreased stance time - right, Decreased step length - right Gait velocity: decr     General Gait Details: close guard for safety, cues for upright posture, looking down the hall vs at feet, increasing R foot clearance via hip and knee flexion. Deficit in power during R push off   Stairs             Wheelchair Mobility    Modified Rankin (Stroke Patients Only) Modified Rankin (Stroke Patients Only) Pre-Morbid Rankin Score: Slight disability Modified Rankin: Moderately severe disability     Balance Overall balance assessment: Needs assistance Sitting-balance support: Feet supported Sitting balance-Leahy Scale: Good     Standing balance support: During functional activity, Reliant on assistive device for balance, Single extremity supported Standing balance-Leahy Scale: Poor Standing balance comment: reliant on at least SL support                            Cognition Arousal/Alertness: Awake/alert Behavior During Therapy: [facility]  for tasks assessed/performed Overall Cognitive Status: Within Functional Limits for tasks assessed                                  General Comments: good awareness of deficits, does occasionally require cuing for safe mobility. Flat affect, suspect due to fatigue        Exercises Other Exercises Other Exercises: Standing balance intervention: single leg to target tap, bilat, 2x10. Seated rest x1 minute in between sets    General Comments        Pertinent Vitals/Pain Pain Assessment Pain Assessment: No/denies pain Pain Intervention(s): Monitored during session    Home Living                          Prior Function            PT Goals (current goals can now be found in the care plan section) Acute Rehab PT Goals Patient Stated Goal: Back to independence at home PT Goal Formulation: With patient Time For Goal Achievement: 10/01/21 Potential to Achieve Goals: Good Progress towards PT goals: Progressing toward goals    Frequency    Min 4X/week      PT Plan Current plan remains appropriate    Co-evaluation              AM-PAC PT "6 Clicks" Mobility   Outcome Measure  Help needed turning from your back to your side while in a flat bed without using bedrails?: A Little Help needed moving from lying on your back to sitting on the side of a flat bed without using bedrails?: A Little Help needed moving to and from a bed to a chair (including a wheelchair)?: A Little Help needed standing up from a chair using your arms (e.g., wheelchair or bedside chair)?: A Little Help needed to walk in hospital room?: A Little Help needed climbing 3-5 steps with a railing? : A Lot 6 Click Score: 17    End of Session Equipment Utilized During Treatment: Gait belt Activity Tolerance: Patient tolerated treatment well;Patient limited by fatigue Patient left: in bed;with call bell/phone within reach;with bed alarm set Nurse Communication: Mobility status PT Visit Diagnosis: Unsteadiness on feet (R26.81);Difficulty in walking, not elsewhere classified (R26.2)     Time:  2694-8546 PT Time Calculation (min) (ACUTE ONLY): 36 min  Charges:  $Gait Training: 8-22 mins $Neuromuscular Re-education: 8-22 mins                     Stacie Glaze, PT DPT Acute Rehabilitation Services Pager [phone]  Office [phone]    [place] 09/23/2021, 11:58 AM

## 2021-09-24 ENCOUNTER — Inpatient Hospital Stay (HOSPITAL_COMMUNITY): Payer: Medicare Other

## 2021-09-24 ENCOUNTER — Encounter (HOSPITAL_COMMUNITY)
Admission: EM | Disposition: A | Discharge status: Home Health | Payer: Self-pay | Source: Home / Self Care | Attending: Internal Medicine

## 2021-09-24 ENCOUNTER — Other Ambulatory Visit: Payer: Self-pay

## 2021-09-24 ENCOUNTER — Inpatient Hospital Stay (HOSPITAL_COMMUNITY): Payer: Medicare Other | Admitting: Certified Registered Nurse Anesthetist

## 2021-09-24 DIAGNOSIS — I6522 Occlusion and stenosis of left carotid artery: Secondary | ICD-10-CM

## 2021-09-24 DIAGNOSIS — E876 Hypokalemia: Secondary | ICD-10-CM

## 2021-09-24 DIAGNOSIS — I10 Essential (primary) hypertension: Secondary | ICD-10-CM

## 2021-09-24 DIAGNOSIS — E039 Hypothyroidism, unspecified: Secondary | ICD-10-CM

## 2021-09-24 HISTORY — PX: ULTRASOUND GUIDANCE FOR VASCULAR ACCESS: SHX6516

## 2021-09-24 HISTORY — PX: TRANSCAROTID ARTERY REVASCULARIZATIONÂ: SHX6778

## 2021-09-24 LAB — CBC
HCT: 30.2 % — ABNORMAL LOW (ref 36.0–46.0)
Hemoglobin: 9.9 g/dL — ABNORMAL LOW (ref 12.0–15.0)
MCH: 30.7 pg (ref 26.0–34.0)
MCHC: 32.8 g/dL (ref 30.0–36.0)
MCV: 93.5 fL (ref 80.0–100.0)
Platelets: 237 10*3/uL (ref 150–400)
RBC: 3.23 MIL/uL — ABNORMAL LOW (ref 3.87–5.11)
RDW: 13.1 % (ref 11.5–15.5)
WBC: 5.9 10*3/uL (ref 4.0–10.5)
nRBC: 0 % (ref 0.0–0.2)

## 2021-09-24 LAB — POCT ACTIVATED CLOTTING TIME: Activated Clotting Time: 281 seconds

## 2021-09-24 LAB — GLUCOSE, CAPILLARY
Glucose-Capillary: 205 mg/dL — ABNORMAL HIGH (ref 70–99)
Glucose-Capillary: 187 mg/dL — ABNORMAL HIGH (ref 70–99)
Glucose-Capillary: 201 mg/dL — ABNORMAL HIGH (ref 70–99)
Glucose-Capillary: 242 mg/dL — ABNORMAL HIGH (ref 70–99)
Glucose-Capillary: 277 mg/dL — ABNORMAL HIGH (ref 70–99)
Glucose-Capillary: 250 mg/dL — ABNORMAL HIGH (ref 70–99)

## 2021-09-24 LAB — BASIC METABOLIC PANEL
Anion gap: 8 (ref 5–15)
BUN: 16 mg/dL (ref 8–23)
CO2: 22 mmol/L (ref 22–32)
Calcium: 8.7 mg/dL — ABNORMAL LOW (ref 8.9–10.3)
Chloride: 107 mmol/L (ref 98–111)
Creatinine, Ser: 0.98 mg/dL (ref 0.44–1.00)
GFR, Estimated: >60 mL/min (ref >60)
Glucose, Bld: 246 mg/dL — ABNORMAL HIGH (ref 70–99)
Potassium: 3.6 mmol/L (ref 3.5–5.1)
Sodium: 137 mmol/L (ref 135–145)

## 2021-09-24 LAB — TYPE AND SCREEN
ABO/RH(D): O POS
Antibody Screen: NEGATIVE

## 2021-09-24 LAB — ABO/RH: ABO/RH(D): O POS

## 2021-09-24 SURGERY — TRANSCAROTID ARTERY REVASCULARIZATION (TCAR)
Anesthesia: General | Site: Neck | Laterality: Right

## 2021-09-24 MED ORDER — SODIUM CHLORIDE 0.9 % IV SOLN: INTRAVENOUS | Status: DC

## 2021-09-24 MED ORDER — PROTAMINE SULFATE 10 MG/ML IV SOLN
INTRAVENOUS | Status: DC | PRN
  Administered 2021-09-24: 50 mg via INTRAVENOUS

## 2021-09-24 MED ORDER — MIDAZOLAM HCL 2 MG/2ML IJ SOLN
INTRAMUSCULAR | Status: DC | PRN
  Administered 2021-09-24: 1 mg via INTRAVENOUS

## 2021-09-24 MED ORDER — PROTAMINE SULFATE 10 MG/ML IV SOLN
INTRAVENOUS | Status: AC
  Filled 2021-09-24: qty 10

## 2021-09-24 MED ORDER — PHENYLEPHRINE HCL-NACL 20-0.9 MG/250ML-% IV SOLN
INTRAVENOUS | Status: DC | PRN
Start: 2021-09-24 — End: 2021-09-24

## 2021-09-24 MED ORDER — EPHEDRINE SULFATE-NACL 50-0.9 MG/10ML-% IV SOSY: PREFILLED_SYRINGE | INTRAVENOUS | Status: DC | PRN

## 2021-09-24 MED ORDER — PHENYLEPHRINE 40 MCG/ML (10ML) SYRINGE FOR IV PUSH (FOR BLOOD PRESSURE SUPPORT)
PREFILLED_SYRINGE | INTRAVENOUS | Status: DC | PRN
Start: 2021-09-24 — End: 2021-09-24
  Administered 2021-09-24 (×4): 80 ug via INTRAVENOUS

## 2021-09-24 MED ORDER — GLYCOPYRROLATE PF 0.2 MG/ML IJ SOSY
PREFILLED_SYRINGE | INTRAMUSCULAR | Status: DC | PRN
  Administered 2021-09-24: .2 mg via INTRAVENOUS

## 2021-09-24 MED ORDER — HEPARIN 6000 UNIT IRRIGATION SOLUTION
Status: DC | PRN
  Administered 2021-09-24: 1

## 2021-09-24 MED ORDER — ENOXAPARIN SODIUM 30 MG/0.3ML IJ SOSY
30.0000 mg | PREFILLED_SYRINGE | INTRAMUSCULAR | Status: DC
  Administered 2021-09-25: 30 mg via SUBCUTANEOUS
  Filled 2021-09-24: qty 0.3

## 2021-09-24 MED ORDER — ONDANSETRON HCL 4 MG/2ML IJ SOLN: 4.0000 mg | Freq: Four times a day (QID) | INTRAMUSCULAR | Status: DC | PRN

## 2021-09-24 MED ORDER — FENTANYL CITRATE (PF) 250 MCG/5ML IJ SOLN
INTRAMUSCULAR | Status: AC
  Filled 2021-09-24: qty 5

## 2021-09-24 MED ORDER — EPHEDRINE SULFATE-NACL 50-0.9 MG/10ML-% IV SOSY
PREFILLED_SYRINGE | INTRAVENOUS | Status: DC | PRN
  Administered 2021-09-24: 15 mg via INTRAVENOUS
  Administered 2021-09-24: 10 mg via INTRAVENOUS

## 2021-09-24 MED ORDER — DIPHENHYDRAMINE HCL 50 MG PO CAPS
50.0000 mg | ORAL_CAPSULE | Freq: Once | ORAL | Status: AC
  Administered 2021-09-24: 50 mg via ORAL
  Filled 2021-09-24: qty 1

## 2021-09-24 MED ORDER — PROPOFOL 500 MG/50ML IV EMUL
INTRAVENOUS | Status: DC | PRN
  Administered 2021-09-24: 50 ug/kg/min via INTRAVENOUS

## 2021-09-24 MED ORDER — INSULIN ASPART 100 UNIT/ML IJ SOLN
INTRAMUSCULAR | Status: AC
  Filled 2021-09-24: qty 1

## 2021-09-24 MED ORDER — PHENOL 1.4 % MT LIQD: 1.0000 | OROMUCOSAL | Status: DC | PRN

## 2021-09-24 MED ORDER — DIPHENHYDRAMINE HCL 50 MG/ML IJ SOLN: 50.0000 mg | Freq: Once | INTRAMUSCULAR | Status: AC

## 2021-09-24 MED ORDER — CHLORHEXIDINE GLUCONATE 0.12 % MT SOLN
15.0000 mL | Freq: Two times a day (BID) | OROMUCOSAL | Status: DC
  Administered 2021-09-24 – 2021-09-25 (×3): 15 mL via OROMUCOSAL
  Filled 2021-09-24 (×3): qty 15

## 2021-09-24 MED ORDER — FENTANYL CITRATE (PF) 250 MCG/5ML IJ SOLN
INTRAMUSCULAR | Status: DC | PRN
  Administered 2021-09-24: 100 ug via INTRAVENOUS
  Administered 2021-09-24 (×2): 50 ug via INTRAVENOUS

## 2021-09-24 MED ORDER — EPHEDRINE 5 MG/ML INJ
INTRAVENOUS | Status: AC
  Filled 2021-09-24: qty 5

## 2021-09-24 MED ORDER — ALUM & MAG HYDROXIDE-SIMETH 200-200-20 MG/5ML PO SUSP: 15.0000 mL | ORAL | Status: DC | PRN

## 2021-09-24 MED ORDER — SODIUM CHLORIDE 0.9 % IV SOLN: 500.0000 mL | Freq: Once | INTRAVENOUS | Status: DC | PRN

## 2021-09-24 MED ORDER — SUGAMMADEX SODIUM 200 MG/2ML IV SOLN
INTRAVENOUS | Status: DC | PRN
  Administered 2021-09-24: 332 mg via INTRAVENOUS

## 2021-09-24 MED ORDER — PANTOPRAZOLE SODIUM 40 MG PO TBEC
40.0000 mg | DELAYED_RELEASE_TABLET | Freq: Every day | ORAL | Status: DC
  Administered 2021-09-24 – 2021-09-27 (×4): 40 mg via ORAL
  Filled 2021-09-24 (×4): qty 1

## 2021-09-24 MED ORDER — SENNOSIDES-DOCUSATE SODIUM 8.6-50 MG PO TABS: 1.0000 | ORAL_TABLET | Freq: Every evening | ORAL | Status: DC | PRN

## 2021-09-24 MED ORDER — DOCUSATE SODIUM 100 MG PO CAPS
100.0000 mg | ORAL_CAPSULE | Freq: Every day | ORAL | Status: DC
  Administered 2021-09-25 – 2021-09-27 (×3): 100 mg via ORAL
  Filled 2021-09-24 (×3): qty 1

## 2021-09-24 MED ORDER — BISACODYL 5 MG PO TBEC: 5.0000 mg | DELAYED_RELEASE_TABLET | Freq: Every day | ORAL | Status: DC | PRN

## 2021-09-24 MED ORDER — HYDROMORPHONE HCL 1 MG/ML IJ SOLN
0.5000 mg | INTRAMUSCULAR | Status: DC | PRN
  Administered 2021-09-24 – 2021-09-25 (×3): 1 mg via INTRAVENOUS
  Filled 2021-09-24 (×4): qty 1

## 2021-09-24 MED ORDER — LACTATED RINGERS IV SOLN: INTRAVENOUS | Status: DC

## 2021-09-24 MED ORDER — INSULIN ASPART 100 UNIT/ML IJ SOLN
4.0000 [IU] | Freq: Once | INTRAMUSCULAR | Status: AC
  Administered 2021-09-24: 4 [IU] via SUBCUTANEOUS

## 2021-09-24 MED ORDER — HEPARIN SODIUM (PORCINE) 1000 UNIT/ML IJ SOLN
INTRAMUSCULAR | Status: DC | PRN
Start: 2021-09-24 — End: 2021-09-24
  Administered 2021-09-24: 8000 [IU] via INTRAVENOUS

## 2021-09-24 MED ORDER — ORAL CARE MOUTH RINSE: 15.0000 mL | Freq: Two times a day (BID) | OROMUCOSAL | Status: DC

## 2021-09-24 MED ORDER — LABETALOL HCL 5 MG/ML IV SOLN: 10.0000 mg | INTRAVENOUS | Status: DC | PRN

## 2021-09-24 MED ORDER — DEXMEDETOMIDINE (PRECEDEX) IN NS 20 MCG/5ML (4 MCG/ML) IV SYRINGE
PREFILLED_SYRINGE | INTRAVENOUS | Status: AC
  Filled 2021-09-24: qty 5

## 2021-09-24 MED ORDER — HYDRALAZINE HCL 20 MG/ML IJ SOLN: 5.0000 mg | INTRAMUSCULAR | Status: DC | PRN

## 2021-09-24 MED ORDER — METOPROLOL TARTRATE 5 MG/5ML IV SOLN: 2.0000 mg | INTRAVENOUS | Status: DC | PRN

## 2021-09-24 MED ORDER — GUAIFENESIN-DM 100-10 MG/5ML PO SYRP: 15.0000 mL | ORAL_SOLUTION | ORAL | Status: DC | PRN

## 2021-09-24 MED ORDER — ONDANSETRON HCL 4 MG/2ML IJ SOLN
INTRAMUSCULAR | Status: DC | PRN
  Administered 2021-09-24: 4 mg via INTRAVENOUS

## 2021-09-24 MED ORDER — HEPARIN SODIUM (PORCINE) 5000 UNIT/ML IJ SOLN: 5000.0000 [IU] | Freq: Three times a day (TID) | INTRAMUSCULAR | Status: DC

## 2021-09-24 MED ORDER — HEPARIN SODIUM (PORCINE) 1000 UNIT/ML IJ SOLN
INTRAMUSCULAR | Status: AC
  Filled 2021-09-24: qty 20

## 2021-09-24 MED ORDER — 0.9 % SODIUM CHLORIDE (POUR BTL) OPTIME
TOPICAL | Status: DC | PRN
  Administered 2021-09-24: 1000 mL

## 2021-09-24 MED ORDER — DEXAMETHASONE SODIUM PHOSPHATE 10 MG/ML IJ SOLN
INTRAMUSCULAR | Status: DC | PRN
  Administered 2021-09-24: 10 mg via INTRAVENOUS

## 2021-09-24 MED ORDER — HYDROCORTISONE SOD SUC (PF) 250 MG IJ SOLR: 200.0000 mg | Freq: Once | INTRAMUSCULAR | Status: DC

## 2021-09-24 MED ORDER — PHENYLEPHRINE HCL-NACL 20-0.9 MG/250ML-% IV SOLN
INTRAVENOUS | Status: DC | PRN
  Administered 2021-09-24: 25 ug/min via INTRAVENOUS

## 2021-09-24 MED ORDER — OXYCODONE HCL 5 MG PO TABS: 5.0000 mg | ORAL_TABLET | Freq: Once | ORAL | Status: DC | PRN

## 2021-09-24 MED ORDER — POTASSIUM CHLORIDE CRYS ER 20 MEQ PO TBCR: 20.0000 meq | EXTENDED_RELEASE_TABLET | Freq: Every day | ORAL | Status: DC | PRN

## 2021-09-24 MED ORDER — MIDAZOLAM HCL 2 MG/2ML IJ SOLN
INTRAMUSCULAR | Status: AC
  Filled 2021-09-24: qty 2

## 2021-09-24 MED ORDER — OXYCODONE HCL 5 MG/5ML PO SOLN: 5.0000 mg | Freq: Once | ORAL | Status: DC | PRN

## 2021-09-24 MED ORDER — LIDOCAINE 2% (20 MG/ML) 5 ML SYRINGE
INTRAMUSCULAR | Status: DC | PRN
  Administered 2021-09-24: 60 mg via INTRAVENOUS

## 2021-09-24 MED ORDER — FENTANYL CITRATE (PF) 100 MCG/2ML IJ SOLN
INTRAMUSCULAR | Status: AC
  Filled 2021-09-24: qty 2

## 2021-09-24 MED ORDER — ROCURONIUM BROMIDE 10 MG/ML (PF) SYRINGE
PREFILLED_SYRINGE | INTRAVENOUS | Status: AC
  Filled 2021-09-24: qty 20

## 2021-09-24 MED ORDER — LIDOCAINE 2% (20 MG/ML) 5 ML SYRINGE
INTRAMUSCULAR | Status: AC
  Filled 2021-09-24: qty 10

## 2021-09-24 MED ORDER — ROCURONIUM BROMIDE 10 MG/ML (PF) SYRINGE
PREFILLED_SYRINGE | INTRAVENOUS | Status: DC | PRN
  Administered 2021-09-24: 50 mg via INTRAVENOUS
  Administered 2021-09-24: 30 mg via INTRAVENOUS

## 2021-09-24 MED ORDER — IODIXANOL 320 MG/ML IV SOLN
INTRAVENOUS | Status: DC | PRN
  Administered 2021-09-24: 40 mL via INTRA_ARTERIAL

## 2021-09-24 MED ORDER — CEFAZOLIN SODIUM-DEXTROSE 2-4 GM/100ML-% IV SOLN
2.0000 g | Freq: Three times a day (TID) | INTRAVENOUS | Status: AC
  Administered 2021-09-24 – 2021-09-25 (×2): 2 g via INTRAVENOUS
  Filled 2021-09-24 (×2): qty 100

## 2021-09-24 MED ORDER — FENTANYL CITRATE (PF) 100 MCG/2ML IJ SOLN
25.0000 ug | INTRAMUSCULAR | Status: DC | PRN
  Administered 2021-09-24: 50 ug via INTRAVENOUS

## 2021-09-24 MED ORDER — PROPOFOL 10 MG/ML IV BOLUS
INTRAVENOUS | Status: DC | PRN
  Administered 2021-09-24: 200 mg via INTRAVENOUS

## 2021-09-24 MED ORDER — MAGNESIUM SULFATE 2 GM/50ML IV SOLN: 2.0000 g | Freq: Every day | INTRAVENOUS | Status: DC | PRN

## 2021-09-24 SURGICAL SUPPLY — 50 items
BAG BANDED W/RUBBER/TAPE 36X54 (MISCELLANEOUS) ×3 IMPLANT
BALLN STERLING RX 5X30X80 (BALLOONS) ×3
BALLN STERLING RX 6X20X80 (BALLOONS) ×3
BALLOON STERLING RX 5X30X80 (BALLOONS) IMPLANT
BALLOON STERLING RX 6X20X80 (BALLOONS) IMPLANT
CANISTER SUCT 3000ML PPV (MISCELLANEOUS) ×3 IMPLANT
CHLORAPREP W/TINT 26 (MISCELLANEOUS) ×6 IMPLANT
CLIP LIGATING EXTRA MED SLVR (CLIP) IMPLANT
CLIP LIGATING EXTRA SM BLUE (MISCELLANEOUS) IMPLANT
COVER DOME SNAP 22 D (MISCELLANEOUS) ×3 IMPLANT
COVER PROBE W GEL 5X96 (DRAPES) ×3 IMPLANT
DERMABOND ADHESIVE PROPEN (GAUZE/BANDAGES/DRESSINGS) ×1
DERMABOND ADVANCED .7 DNX6 (GAUZE/BANDAGES/DRESSINGS) ×2 IMPLANT
DRAPE FEMORAL ANGIO 80X135IN (DRAPES) ×3 IMPLANT
ELECT REM PT RETURN 9FT ADLT (ELECTROSURGICAL) ×3
ELECTRODE REM PT RTRN 9FT ADLT (ELECTROSURGICAL) ×2 IMPLANT
GAUZE SPONGE 4X4 12PLY STRL (GAUZE/BANDAGES/DRESSINGS) ×3 IMPLANT
GLOVE SURG POLYISO LF SZ8 (GLOVE) ×3 IMPLANT
GOWN STRL REUS W/ TWL LRG LVL3 (GOWN DISPOSABLE) ×4 IMPLANT
GOWN STRL REUS W/ TWL XL LVL3 (GOWN DISPOSABLE) ×2 IMPLANT
GOWN STRL REUS W/TWL LRG LVL3 (GOWN DISPOSABLE) ×2
GOWN STRL REUS W/TWL XL LVL3 (GOWN DISPOSABLE) ×1
GUIDEWIRE ENROUTE 0.014 (WIRE) ×3 IMPLANT
INTRODUCER KIT GALT 7CM (INTRODUCER) ×1
KIT BASIN OR (CUSTOM PROCEDURE TRAY) ×3 IMPLANT
KIT ENCORE 26 ADVANTAGE (KITS) ×3 IMPLANT
KIT INTRODUCER GALT 7 (INTRODUCER) ×2 IMPLANT
KIT TURNOVER KIT B (KITS) ×3 IMPLANT
NDL HYPO 25GX1X1/2 BEV (NEEDLE) IMPLANT
NEEDLE HYPO 25GX1X1/2 BEV (NEEDLE) IMPLANT
NS IRRIG 1000ML POUR BTL (IV SOLUTION) ×3 IMPLANT
PACK CAROTID (CUSTOM PROCEDURE TRAY) ×3 IMPLANT
POSITIONER HEAD DONUT 9IN (MISCELLANEOUS) ×3 IMPLANT
SET MICROPUNCTURE 5F STIFF (MISCELLANEOUS) ×3 IMPLANT
STENT TRANSCAROTID SYSTEM 9X40 (Permanent Stent) ×1 IMPLANT
SUT MNCRL AB 4-0 PS2 18 (SUTURE) ×3 IMPLANT
SUT PROLENE 5 0 C 1 24 (SUTURE) ×3 IMPLANT
SUT PROLENE 6 0 BV (SUTURE) ×1 IMPLANT
SUT SILK 2 0 SH (SUTURE) ×3 IMPLANT
SUT VIC AB 3-0 SH 27 (SUTURE) ×1
SUT VIC AB 3-0 SH 27X BRD (SUTURE) ×2 IMPLANT
SYR 10ML LL (SYRINGE) ×9 IMPLANT
SYR 20ML LL LF (SYRINGE) ×3 IMPLANT
SYR CONTROL 10ML LL (SYRINGE) IMPLANT
SYSTEM TRANSCAROTID NEUROPRTCT (MISCELLANEOUS) ×2 IMPLANT
TOWEL GREEN STERILE (TOWEL DISPOSABLE) ×3 IMPLANT
TRANSCAROTID NEUROPROTECT SYS (MISCELLANEOUS) ×3
WATER STERILE IRR 1000ML POUR (IV SOLUTION) ×3 IMPLANT
WIRE BENTSON .035X145CM (WIRE) ×3 IMPLANT
WIRE TORQFLEX AUST .018X40CM (WIRE) ×2 IMPLANT

## 2021-09-24 NOTE — Progress Notes (Signed)
PT Cancellation Note  Patient Details Name: [name] MRN: [id number] DOB: [birth year]   Cancelled Treatment:    Reason Eval/Treat Not Completed: Patient at procedure or test/unavailable.  Will try back today as able. 09/24/2021  Ginger Carne., PT Acute Rehabilitation Services [phone]  (pager) [phone]  (office)    [name] Shetara Launer 09/24/2021, 11:33 AM

## 2021-09-24 NOTE — Anesthesia Procedure Notes (Signed)
Arterial Line Insertion Start/End2/08/2021 11:00 AM, 09/24/2021 11:10 AM Performed by: Albertha Ghee, MD, Minerva Ends, CRNA, CRNA  Patient location: Pre-op. Preanesthetic checklist: patient identified, IV checked, site marked, risks and benefits discussed, surgical consent, monitors and equipment checked, pre-op evaluation, timeout performed and anesthesia consent Lidocaine 1% used for infiltration Left, radial was placed Catheter size: 20 G Hand hygiene performed  and maximum sterile barriers used   Attempts: 1 Procedure performed using ultrasound guided technique. Ultrasound Notes:anatomy identified, needle tip was noted to be adjacent to the nerve/plexus identified and no ultrasound evidence of intravascular and/or intraneural injection Following insertion, dressing applied and Biopatch. Post procedure assessment: normal and unchanged  Patient tolerated the procedure well with no immediate complications.

## 2021-09-24 NOTE — TOC Progression Note (Signed)
Transition of Care [facility]) - Progression Note    Patient Details  Name: [name] MRN: [id number] Date of Birth: Mar 22, 1951  [facility] of [facility]) CM/SW Contact  Pollie Friar, RN Phone Number: 09/24/2021, 8:31 AM  Clinical Narrative:    [place] IR has also denied for admission. They dont feel that her insurance will cover an IR stay. Pt is not interested in [facility] area for rehab.  TOC following.   Expected Discharge Plan: IP Rehab Facility Barriers to Discharge: Continued Medical Work up  Expected Discharge Plan and Services Expected Discharge Plan: [place]   Discharge Planning Services: CM Consult Post Acute Care Choice: IP Rehab Living arrangements for the past 2 months: Single Family Home                                       Social Determinants of Health (SDOH) Interventions    Readmission Risk Interventions No flowsheet data found.

## 2021-09-24 NOTE — Progress Notes (Signed)
Pt arrived to 4e from PACU. Pt oriented to room and staff. NIH completed. CHG bath done. Telemetry box applied and CCMD notified x2 verifiers. Vitals obtained.

## 2021-09-24 NOTE — Progress Notes (Signed)
°  Day of Surgery Note    Subjective:  resting comfortably in recovery   Vitals:   09/24/21 1300 09/24/21 1315  BP: (!) 139/57 (!) 131/47  Pulse: (!) 104 (!) 103  Resp: 18 18  Temp: 98.5 F (36.9 C)   SpO2: 93% 97%    Incisions:   left neck incision is clean and dry with small firm hematoma; bilateral groins are soft without hematoma Extremities:   moving all extremities   Assessment/Plan:  This is a 71 y.o. female who is s/p  Left TCAR  -incision left neck with small hematoma-unchanged from arrival in recovery -a-line with elevated pressure.  Cuff pressures in the 130's in both arms-will go by cuff pressure. -to 4 east later today -continue plavix/asa/statin   Leontine Locket, PA-C 09/24/2021 1:29 PM [phone]

## 2021-09-24 NOTE — Anesthesia Preprocedure Evaluation (Signed)
Anesthesia Evaluation  Patient identified by MRN, date of birth, ID band Patient awake    Reviewed: Allergy & Precautions, H&P , NPO status , Patient's Chart, lab work & pertinent test results  Airway Mallampati: II   Neck ROM: full    Dental   Pulmonary shortness of breath, asthma , sleep apnea ,    breath sounds clear to auscultation       Cardiovascular hypertension, + Peripheral Vascular Disease   Rhythm:regular Rate:Normal     Neuro/Psych  Headaches,  Neuromuscular disease CVA    GI/Hepatic hiatal hernia, GERD  ,  Endo/Other  diabetes, Type 2Hypothyroidism   Renal/GU      Musculoskeletal  (+) Arthritis ,   Abdominal   Peds  Hematology   Anesthesia Other Findings   Reproductive/Obstetrics                             Anesthesia Physical Anesthesia Plan  ASA: 3  Anesthesia Plan: General   Post-op Pain Management:    Induction: Intravenous  PONV Risk Score and Plan: 3 and Ondansetron, Dexamethasone, Midazolam and Treatment may vary due to age or medical condition  Airway Management Planned: Oral ETT  Additional Equipment: Arterial line  Intra-op Plan:   Post-operative Plan: Extubation in OR  Informed Consent: I have reviewed the patients History and Physical, chart, labs and discussed the procedure including the risks, benefits and alternatives for the proposed anesthesia with the patient or authorized representative who has indicated his/her understanding and acceptance.     Dental advisory given  Plan Discussed with: CRNA, Anesthesiologist and Surgeon  Anesthesia Plan Comments:         Anesthesia Quick Evaluation

## 2021-09-24 NOTE — Progress Notes (Signed)
Progress Note  Patient: [name] AOZ:[id number] DOB: 1951/07/24 DOA: 09/16/2021  DOS: 09/24/2021  LOS: 5   Brief hospital course: [name] is a 71 y.o. 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  She felt similar to when she had previous stroke and hence came to the ED. Of note, patient recently had diarrhea.  Her neurologist at [facility] advised her to hold CellCept for 10 days after which diarrhea resolved.   In the ED, her blood pressure was elevated to 173/70 CT head was negative. Serum glucose was elevated to 239. SARS-CoV-2 and influenza PCR negative. Chest x-ray negative for focal consolidation, edema, effusion. CT head without contrast negative for acute intracranial findings. Admitted to hospitalist service Neurology consulted.  Assessment and Plan: * Right facial numbness- (present on admission) History of left thalamic infarct with mild right-sided residual weakness Symptomatic left carotid stenosis: s/p TCAR 09/24/2021 > continue ASA, plavix, statin. BP management per [facility].  Presenting with diminished sensation right face and extremities or stroke work-up.  Seen by neurology, suspicion is for complicated migraine versus psychogenic presentation.  Hypokalemia: Resolved.  Slow orally, check magnesium.  Hold HCTZ.  Hypothyroidism: Last TSH was wnl. Continue Synthroid.  Insulin dependent type 2 diabetes mellitus (HCC) Wearing insulin pump however patient states insulin ran out while in the ED.  Placed on moderate SSI in the meantime.  Hold Jardiance.  Primary hypertension- (present on admission) Hold  antihypertensives for now to allow for permissive hypertension.  Hyperlipidemia associated with type 2 diabetes mellitus ([place])- (present on admission) Continue rosuvastatin and Zetia.  OSA (obstructive sleep apnea)- (present on admission) Continue CPAP nightly.  Myasthenia gravis ([place])- (present on admission) Follows with [facility] neurology.  CellCept recently held due to diarrhea which has since resolved.  Continue chronic prednisone 10 mg daily.  She receives Rituxan and IVIG infusions through [facility].  Subjective: Postoperatively the patient is very tired with pain all over. No nausea or vomiting or new numbness or weakness or speech changes.   Objective: Vitals:   09/24/21 1500 09/24/21 1600 09/24/21 1606 09/24/21 1700  BP: 129/66 130/66 130/66 (!) 104/55  Pulse: (!) 110 (!) 109 (!) 110 100  Resp: 16 15 16 15   Temp:   97.8 F (36.6 C) 97.8 F (36.6 C)  TempSrc:   Oral Oral  SpO2: 97% 96% 97% 95%  Weight:      Height:       Gen: Nontoxic 71 y.o. female in no distress Pulm: Nonlabored breathing 1L O2, 98%. Clear CV: Regular rate and rhythm. No murmur, rub, or gallop. No JVD, no pitting dependent edema. GI: Abdomen soft, non-tender, non-distended, with normoactive bowel sounds.  Ext: Warm, no deformities Skin: Left carotid surgical transverse incision site c/d/I with dried blood, very modest tender hematoma. Femoral access sites c/d/I not tender to palpation. No other rashes, lesions or ulcers on visualized skin. Neuro: Alert and oriented, MAE, no dysarthria. Psych: Judgement and insight appear fair. Mood euthymic & affect congruent. Behavior is appropriate.    Data Personally reviewed: Hgb down 11 > 10 > 9.9 K better 3.6, SCr stable 1.12. SARS-CoV-2 PCR 1/24 negative    Family Communication: None at bedside  Disposition: Status is: Inpatient Remains inpatient appropriate because: TCAR 2/1 Planned Discharge Destination: Home with [place],  MD 09/24/2021 6:42 PM Page by [email or web address]

## 2021-09-24 NOTE — Progress Notes (Signed)
Inpatient Diabetes Program Recommendations  AACE/ADA: New Consensus Statement on Inpatient Glycemic Control (2015)  Target Ranges:  Prepandial:   less than 140 mg/dL      Peak postprandial:   less than 180 mg/dL (1-2 hours)      Critically ill patients:  140 - 180 mg/dL   Lab Results  Component Value Date   GLUCAP 205 (H) 09/24/2021   HGBA1C 9.7 (H) 09/17/2021    Review of Glycemic Control  Latest Reference Range & Units 09/23/21 06:11 09/23/21 12:02 09/23/21 16:20 09/23/21 22:34 09/24/21 04:06  Glucose-Capillary 70 - 99 mg/dL 151 (H) 275 (H) 276 (H) 189 (H) 205 (H)  (H): Data is abnormally high  Inpatient Diabetes Program Recommendations:    Please increase meal coverage to 8 units Novolog TID with meals if eats at least 50%  Will continue to follow while inpatient.  Thank you, Reche Dixon, MSN, RN Diabetes Coordinator Inpatient Diabetes Program [phone] (team pager from 8a-5p)

## 2021-09-24 NOTE — Transfer of Care (Signed)
Immediate Anesthesia Transfer of Care Note  Patient: [name]  Procedure(s) Performed: LEFT TRANSCAROTID ARTERY REVASCULARIZATION (Left: Neck) ULTRASOUND GUIDANCE FOR VASCULAR ACCESS (Right: Groin)  Patient Location: PACU  Anesthesia Type:General  Level of Consciousness: awake, alert  and oriented  Airway & Oxygen Therapy: Patient Spontanous Breathing  Post-op Assessment: Report given to RN and Post -op Vital signs reviewed and stable  Post vital signs: Reviewed and stable  Last Vitals:  Vitals Value Taken Time  BP 139/57 09/24/21 1259  Temp    Pulse 105 09/24/21 1306  Resp 20 09/24/21 1306  SpO2 93 % 09/24/21 1306  Vitals shown include unvalidated device data.  Last Pain:  Vitals:   09/24/21 1052  TempSrc:   PainSc: 6       Patients Stated Pain Goal: 0 (71/99/41 2904)  Complications: No notable events documented.

## 2021-09-24 NOTE — Progress Notes (Signed)
Patient transported to surgery. Belongings sent with patient's family.

## 2021-09-24 NOTE — Progress Notes (Signed)
[facility]  PROGRESS NOTE  ASSESSMENT / PLAN: [name] is a 71 y.o. female with symptomatic left carotid artery stenosis; plan left TCAR today in OR. She has been loaded with ASA/Plavix. Reviewed risks / benefits / alternatives.   SUBJECTIVE: No complaints. No neurologic changes.  OBJECTIVE: BP (!) 183/82    Pulse 87    Temp 98.9 F (37.2 C) (Oral)    Resp 18    Ht 5\' 8"  (1.727 m)    Wt 83 kg    SpO2 99%    BMI 27.83 kg/m   Intake/Output Summary (Last 24 hours) at 09/24/2021 1059 Last data filed at 09/23/2021 2230 Gross per 24 hour  Intake 240 ml  Output --  Net 240 ml    Constitutional: well appearing. no acute distress. CNS: right arm weakness unchanged Cardiac: RRR. Pulmonary: unlabored Abdomen: not distended  CBC Latest Ref Rng & Units 09/24/2021 09/20/2021 09/18/2021  WBC 4.0 - 10.5 K/uL 5.9 6.4 5.5  Hemoglobin 12.0 - 15.0 g/dL 9.9(L) 10.5(L) 11.6(L)  Hematocrit 36.0 - 46.0 % 30.2(L) 31.6(L) 35.2(L)  Platelets 150 - 400 K/uL 237 223 209     CMP Latest Ref Rng & Units 09/24/2021 09/23/2021 09/22/2021  Glucose 70 - 99 mg/dL 246(H) 151(H) 191(H)  BUN 8 - 23 mg/dL 16 17 15   Creatinine 0.44 - 1.00 mg/dL 0.98 1.17(H) 1.06(H)  Sodium 135 - 145 mmol/L 137 138 138  Potassium 3.5 - 5.1 mmol/L 3.6 3.6 3.4(L)  Chloride 98 - 111 mmol/L 107 109 104  CO2 22 - 32 mmol/L 22 24 27   Calcium 8.9 - 10.3 mg/dL 8.7(L) 8.6(L) 8.5(L)  Total Protein 6.5 - 8.1 g/dL - - -  Total Bilirubin 0.3 - 1.2 mg/dL - - -  Alkaline Phos 38 - 126 U/L - - -  AST 15 - 41 U/L - - -  ALT 0 - 44 U/L - - -    Estimated Creatinine Clearance: 62 mL/min (by C-G formula based on SCr of 0.98 mg/dL).  Yevonne Aline. Stanford Breed, MD [facility] Phone Number: [phone] 09/24/2021 10:59 AM

## 2021-09-24 NOTE — Op Note (Addendum)
DATE OF SERVICE: 09/24/2021  PATIENT:  [name]  71 y.o. female  PRE-OPERATIVE DIAGNOSIS:  symptomatic left carotid artery stenosis  POST-OPERATIVE DIAGNOSIS:  Same  PROCEDURE:   left transcarotid artery revascularization (TCAR)  SURGEON:  Surgeon(s) and Role:    * Cherre Robins, MD - Primary  ASSISTANT: Gerri Lins, PA-C  An experienced assistant was required given the complexity of this procedure and the standard of surgical care. My assistant helped with exposure through counter tension, suctioning, ligation and retraction to better visualize the surgical field.  My assistant expedited sewing during the case by following my sutures. Wherever I use the term "we" in the report, my assistant actively helped me with that portion of the procedure.  ANESTHESIA:   general  EBL: minimal  BLOOD ADMINISTERED:none  DRAINS: none   LOCAL MEDICATIONS USED:  NONE  SPECIMEN:  none  COUNTS: confirmed correct.  TOURNIQUET:  none  PATIENT DISPOSITION:  PACU - hemodynamically stable.   Delay start of Pharmacological VTE agent (>24hrs) due to surgical blood loss or risk of bleeding: no  INDICATION FOR PROCEDURE: [name] is a 71 y.o. female with symptomatic left carotid artery and critical asymptomatic right carotid artery stenosis. After careful discussion of risks, benefits, and alternatives the patient was offered left transcarotid artery revascularization. We specifically discussed risk of stroke, cranial nerve injury, hematoma. The patient understood and wished to proceed.  OPERATIVE FINDINGS: good technical result from TCAR. Lesion did require postdilation with a 6x63mm balloon. A 9x9mm stent was used. The patient awoke neurologically intact.  DESCRIPTION OF PROCEDURE: After identification of the patient in the pre-operative holding area, the patient was transferred to the operating room. The patient was positioned supine on the operating room table. Anesthesia was  induced. The left neck and groins were prepped and draped in standard fashion. A surgical pause was performed confirming correct patient, procedure, and operative location.  Using intraoperative ultrasound the course of the left common carotid artery was mapped on the skin.  A transverse incision was made between the sternal and clavicular heads of the sternocleidomastoid muscle, below the omohyoid. Following longitudinal division of the carotid sheath the jugular vein was partially skeletonized and retracted medially. Once 3 cm of common carotid artery (CCA) were isolated, umbilical tape was placed around the proximal 1/3 of the CCA under direct vision. A 5-O Prolene suture was pre-placed in the anterior wall of the CCA, in a U stitch configuration,  close to the clavicle to facilitate hemostasis upon removal of the arterial sheath at completion of the TCAR procedure.   The contralateral common femoral vein (CFV) was accessed under ultrasound guidance, using standard Seldinger and micropuncture access technique. The venous return sheath was advanced into the CFV over the 0.035 wire provided. Blood was aspirated from the flow line followed by flushing of the Venous Sheath with heparinized saline. The Venous Sheath was secured to the patients skin with suture to maintain  optimal position in the vessel. Heparin was given to obtain a therapeutic activated clotting time >250 seconds prior to arterial access.   A 4-French non-stiffened ENHANCE Transcarotid / Peripheral Access set was used, puncturing the artery with the 21G needle through the pre-placed U stitch while holding gentle traction on the umbilical tape to stabilize and centralize the CCA within the incision. Careful attention was paid to the change in CCA shape when using the umbilical tape to control or lift the artery. The micropuncture wire was then advanced 3-4 cm  into the CCA and, the 21G needle was removed. The micropuncture sheath was  advanced 2-3 cm into the CCA and the wire and dilator were removed. Pulsatile backflow indicated correct positioning. The provided 0.035" J-tipped guidewire was inserted as close as possible to the bifurcation without engaging the lesion. After micropuncture sheath removal, the Transcarotid Arterial Sheath was advanced to the 2.5cm marker and the 0.035 wire and dilator were then removed. Arterial Sheath position was assessed under fluoroscopy in two projections to ensure that the sheath tip was oriented coaxially in the CCA. The Arterial Sheath was sutured to the patient with gentle forward tension. Blood was slowly aspirated followed by flushing with heparinized saline. No ingress of air bubbles through the passive hemostatic valve was observed. The stopcocks were closed. Traction applied to the CCA previously to facilitate access was gently released.  The Flow Controller was connected to the Transcarotid Arterial Sheath, prepared by passively allowing a column of arterial blood to fill the line and connected to the Venous Return Sheath. CCA inflow was occluded proximal to the arteriotomy with a vascular clamp to achieve active flow reversal. To confirm flow reversal, a saline bolus was delivered into the venous flow line on both High and Low flow settings of the Flow Controller. Angiograms were performed with slow injections of a small amount of contrast filling just past the lesion to minimize antegrade transmission of micro-bubbles.  Prior to lesion manipulation, heart rate (96EXB) and systolic BP (284-132GMWN) were managed upwards to optimize flow reversal and procedural neuroprotection. The lesion was crossed with an 0.014 ENROUTE guidewire and pre-dilation of the lesion was performed with a 5x42mm rapid exchange 0.014 compatible balloon catheter to 8 atmospheres for 10 seconds. Stenting was performed with an 9x72mm ENROUTE Transcarotid stent, sized appropriately to the right CCA.  Post dilation  was required with a 6x57mm balloon. A total of 12 minutes of flow reversal was used.  AP and lateral angiograms (gentle contrast injections) were performed to confirm stent placement and arterial wall stent apposition. At [facility] case completion, antegrade flow was restored by releasing the clamp on the CCA then closing the NPS stopcocks to the flow lines. The Transcarotid Arterial Sheath was removed and the pre-closure suture was tied. Heparin reversal was employed and a drain was placed. The Venous Return Sheath was removed and hemostasis was achieved with brief manual compression.   Upon completion of the case instrument and sharps counts were confirmed correct. The patient was transferred to the PACU in good condition. I was present for all portions of the procedure.  Yevonne Aline. Stanford Breed, MD [facility] Phone Number: [phone] 09/24/2021 2:14 PM

## 2021-09-24 NOTE — Anesthesia Procedure Notes (Signed)
Procedure Name: Intubation Date/Time: 09/24/2021 11:30 AM Performed by: Minerva Ends, CRNA Pre-anesthesia Checklist: Patient identified, Emergency Drugs available, Suction available and Patient being monitored Patient Re-evaluated:Patient Re-evaluated prior to induction Oxygen Delivery Method: Circle system utilized Preoxygenation: Pre-oxygenation with 100% oxygen Induction Type: IV induction Ventilation: Mask ventilation without difficulty Laryngoscope Size: Mac and 3 Grade View: Grade III Tube type: Oral Number of attempts: 1 Airway Equipment and Method: Stylet Placement Confirmation: ETT inserted through vocal cords under direct vision, positive ETCO2 and breath sounds checked- equal and bilateral Secured at: 23 cm Tube secured with: Tape Dental Injury: Teeth and Oropharynx as per pre-operative assessment

## 2021-09-25 ENCOUNTER — Encounter (HOSPITAL_COMMUNITY): Payer: Self-pay | Admitting: Vascular Surgery

## 2021-09-25 LAB — GLUCOSE, CAPILLARY
Glucose-Capillary: 237 mg/dL — ABNORMAL HIGH (ref 70–99)
Glucose-Capillary: 155 mg/dL — ABNORMAL HIGH (ref 70–99)
Glucose-Capillary: 160 mg/dL — ABNORMAL HIGH (ref 70–99)

## 2021-09-25 LAB — BASIC METABOLIC PANEL
Anion gap: 8 (ref 5–15)
BUN: 18 mg/dL (ref 8–23)
CO2: 21 mmol/L — ABNORMAL LOW (ref 22–32)
Calcium: 8.1 mg/dL — ABNORMAL LOW (ref 8.9–10.3)
Chloride: 108 mmol/L (ref 98–111)
Creatinine, Ser: 1.11 mg/dL — ABNORMAL HIGH (ref 0.44–1.00)
GFR, Estimated: 54 mL/min — ABNORMAL LOW (ref >60)
Glucose, Bld: 284 mg/dL — ABNORMAL HIGH (ref 70–99)
Potassium: 4.3 mmol/L (ref 3.5–5.1)
Sodium: 137 mmol/L (ref 135–145)

## 2021-09-25 LAB — CBC
HCT: 29.7 % — ABNORMAL LOW (ref 36.0–46.0)
Hemoglobin: 9.6 g/dL — ABNORMAL LOW (ref 12.0–15.0)
MCH: 30.6 pg (ref 26.0–34.0)
MCHC: 32.3 g/dL (ref 30.0–36.0)
MCV: 94.6 fL (ref 80.0–100.0)
Platelets: 232 10*3/uL (ref 150–400)
RBC: 3.14 MIL/uL — ABNORMAL LOW (ref 3.87–5.11)
RDW: 13.2 % (ref 11.5–15.5)
WBC: 10.2 10*3/uL (ref 4.0–10.5)
nRBC: 0 % (ref 0.0–0.2)

## 2021-09-25 MED ORDER — OXYCODONE HCL 5 MG PO TABS
5.0000 mg | ORAL_TABLET | ORAL | Status: DC | PRN
Start: 2021-09-25 — End: 2021-09-27

## 2021-09-25 MED ORDER — HYDROMORPHONE HCL 1 MG/ML IJ SOLN
0.5000 mg | INTRAMUSCULAR | Status: DC | PRN
  Administered 2021-09-25: 1 mg via INTRAVENOUS

## 2021-09-25 MED ORDER — ENOXAPARIN SODIUM 40 MG/0.4ML IJ SOSY
40.0000 mg | PREFILLED_SYRINGE | INTRAMUSCULAR | Status: DC
  Administered 2021-09-26 – 2021-09-27 (×2): 40 mg via SUBCUTANEOUS
  Filled 2021-09-25 (×2): qty 0.4

## 2021-09-25 MED ORDER — INSULIN GLARGINE-YFGN 100 UNIT/ML ~~LOC~~ SOLN
50.0000 [IU] | Freq: Every day | SUBCUTANEOUS | Status: DC
  Administered 2021-09-25 – 2021-09-27 (×3): 50 [IU] via SUBCUTANEOUS
  Filled 2021-09-25 (×3): qty 0.5

## 2021-09-25 MED ORDER — INSULIN ASPART 100 UNIT/ML IJ SOLN
8.0000 [IU] | Freq: Three times a day (TID) | INTRAMUSCULAR | Status: DC
  Administered 2021-09-25 – 2021-09-26 (×6): 8 [IU] via SUBCUTANEOUS

## 2021-09-25 NOTE — Progress Notes (Signed)
PHARMACIST LIPID MONITORING   [name] is a 71 y.o. female s/p TCAR.  Pharmacy has been consulted to optimize lipid-lowering therapy with the indication of secondary prevention for clinical ASCVD.  Recent Labs:  Lipid Panel (last 6 months):   Lab Results  Component Value Date   CHOL 197 09/17/2021   TRIG 149 09/17/2021   HDL 47 09/17/2021   CHOLHDL 4.2 09/17/2021   VLDL 30 09/17/2021   LDLCALC 120 (H) 09/17/2021    Hepatic function panel (last 6 months):   No results found for: AST, ALT, ALKPHOS, BILITOT, BILIDIR, IBILI  SCr (since admission):   Serum creatinine: 1.11 mg/dL (H) 09/25/21 0405 Estimated creatinine clearance: 54.8 mL/min (A)  Current therapy and lipid therapy tolerance Current lipid-lowering therapy: Zetia Documented or reported allergies or intolerances to lipid-lowering therapies (if applicable):    Plan:    1.Statin intensity (high intensity recommended for all patients regardless of the LDL):  No statin changes. The patient is already on a high intensity statin.  2.Add ezetimibe (if any one of the following):   Already on Zetia  3.Refer to lipid clinic:   Yes  4.Follow-up with:  Lipid clinic referral.  5.Follow-up labs after discharge:  No changes in lipid therapy, repeat a lipid panel in one year.     Hildred Laser, PharmD Clinical Pharmacist **Pharmacist phone directory can now be found on [email or web address] ([email or web address]).  Listed under [place].

## 2021-09-25 NOTE — Anesthesia Postprocedure Evaluation (Signed)
Anesthesia Post Note  Patient: [name]  Procedure(s) Performed: LEFT TRANSCAROTID ARTERY REVASCULARIZATION (Left: Neck) ULTRASOUND GUIDANCE FOR VASCULAR ACCESS (Right: Groin)     Patient location during evaluation: PACU Anesthesia Type: General Level of consciousness: awake and alert Pain management: pain level controlled Vital Signs Assessment: post-procedure vital signs reviewed and stable Respiratory status: spontaneous breathing, nonlabored ventilation, respiratory function stable and patient connected to nasal cannula oxygen Cardiovascular status: blood pressure returned to baseline and stable Postop Assessment: no apparent nausea or vomiting Anesthetic complications: no   No notable events documented.  Last Vitals:  Vitals:   09/25/21 0204 09/25/21 0404  BP: 138/60 (!) 145/54  Pulse: 83 88  Resp: 10 12  Temp: 36.5 C 36.9 C  SpO2: 100% 99%    Last Pain:  Vitals:   09/25/21 0453  TempSrc:   PainSc: [place]

## 2021-09-25 NOTE — Progress Notes (Signed)
Pt had left carotid artery stenosis with Dr. Stanford Breed today. She got to this Unit around 4 PM and had not voided since. A bladder scan was done at 2230 and she had 551 cc in bladder. Tried using a purwick and then sitting her up on the bedside commode but she could not void. Informed the on call hospitalist, Dr. Odella Aquas, who ordered a one time In & Out cath.  The cath was done at 2330 and 850 cc of urine was removed from the bladder with no residual inside.  Will continue to monitor.  Lupita Dawn, RN

## 2021-09-25 NOTE — Progress Notes (Signed)
Occupational Therapy Treatment Patient Details Name: [name] MRN: [id number] DOB: [birth year] Today's Date: 09/25/2021   History of present illness 71 y.o. female who presented to ED 1/24 with chest pain, right facial and arm numbness. Work-up indicating evidence of flow reducing stenosis at the right more than left proximal ICA. Patient s/p TCAR 2/1. PMHx significant for seronegative myasthenia gravis, HTN, Hx of CVA, carotid artery stenosis and paraneoplastic retinopathy.   OT comments  OT treatment session with focus on self-care re-education, OOB activity tolerance and functional mobility with RW. Patient progressed to [facility] over toilet in bathroom with use of RW and Min guard A overall. Patient also completed 3/3 grooming tasks standing at sink level with supervision A and unilateral UE support on RW. Patient would benefit from continued acute OT services in prep for safe d/c to next level of care. Patient Mod I at RW level at baseline and would benefit from AIR level therapies to maximize safety/independence with self-car tasks in prep for safe d/c home.    Recommendations for follow up therapy are one component of a multi-disciplinary discharge planning process, led by the attending physician.  Recommendations may be updated based on patient status, additional functional criteria and insurance authorization.    Follow Up Recommendations  Acute inpatient rehab (3hours/day)    Assistance Recommended at Discharge Intermittent Supervision/Assistance  Patient can return home with the following  A little help with walking and/or transfers;A little help with bathing/dressing/bathroom;Assist for transportation;Help with stairs or ramp for entrance;Direct supervision/assist for medications management   Equipment Recommendations  None recommended by OT    Recommendations for Other Services Rehab consult    Precautions / Restrictions Precautions Precautions: Fall Precaution Comments:  [place] Restrictions Weight Bearing Restrictions: No       Mobility Bed Mobility Overal bed mobility: Needs Assistance Bed Mobility: Supine to Sit     Supine to sit: Supervision     General bed mobility comments: Supervision A for line management. HOB elevated and use of bedrail.    Transfers Overall transfer level: Needs assistance Equipment used: Rolling walker (2 wheels) Transfers: Sit to/from Stand Sit to Stand: Min guard           General transfer comment: Min guard for safety. Slow to rise.     Balance Overall balance assessment: Needs assistance Sitting-balance support: Feet supported Sitting balance-Leahy Scale: Good     Standing balance support: During functional activity, Reliant on assistive device for balance, Single extremity supported Standing balance-Leahy Scale: Poor Standing balance comment: Reports being unsteady on her feet.                           ADL either performed or assessed with clinical judgement   ADL Overall ADL's : Needs assistance/impaired     Grooming: Supervision/safety;Standing Grooming Details (indicate cue type and reason): 2/3 grooming tasks standing at sink level with S             Lower Body Dressing: Sit to/from stand;Min guard   Toilet Transfer: Min guard;Rolling walker (2 wheels);Ambulation;Grab Information systems manager Details (indicate cue type and reason): To BSC over toilet in bathroom with Min guard and use of RW. Toileting- Clothing Manipulation and Hygiene: Sitting/lateral lean;Min guard Toileting - Clothing Manipulation Details (indicate cue type and reason): 3/3 parts of toileting task with Min guard for safety.            Extremity/Trunk Assessment  Vision       Perception     Praxis      Cognition Arousal/Alertness: Awake/alert Behavior During Therapy: WFL for tasks assessed/performed Overall Cognitive Status: Within Functional Limits for tasks  assessed                                          Exercises      Shoulder Instructions       General Comments 2L O2 upon entry with SpO2 100%. Titrated to RA with SpO2 96%. Remained on RA at conclusion of session.    Pertinent Vitals/ Pain       Pain Assessment Pain Assessment: 0-10 Pain Score: 7  Pain Location: headache and R lateral aspect of neck (incisional) Pain Descriptors / Indicators: Aching, Sore Pain Intervention(s): Limited activity within patient's tolerance, Monitored during session, Repositioned  Home Living                                          Prior Functioning/Environment              Frequency  Min 2X/week        Progress Toward Goals  OT Goals(current goals can now be found in the care plan section)  Progress towards OT goals: Progressing toward goals  Acute Rehab OT Goals Patient Stated Goal: To go to AIR. OT Goal Formulation: With patient Time For Goal Achievement: 10/01/21 Potential to Achieve Goals: Good ADL Goals Pt Will Perform Grooming: with modified independence;standing Pt Will Perform Lower Body Bathing: with modified independence;sit to/from stand Pt Will Perform Lower Body Dressing: with modified independence;sit to/from stand Pt Will Transfer to Toilet: with modified independence;ambulating;regular height toilet;grab bars Additional ADL Goal #1: Pt will indep verbalize at least 3 fall prevention strategies to apply to the home setting  Plan Discharge plan remains appropriate;Frequency remains appropriate    Co-evaluation                 AM-PAC OT "6 Clicks" Daily Activity     Outcome Measure   Help from another person eating meals?: None Help from another person taking care of personal grooming?: A Little Help from another person toileting, which includes using toliet, bedpan, or urinal?: A Little Help from another person bathing (including washing, rinsing, drying)?: A  Little Help from another person to put on and taking off regular upper body clothing?: None Help from another person to put on and taking off regular lower body clothing?: A Little 6 Click Score: 20    End of Session Equipment Utilized During Treatment: Gait belt;Rolling walker (2 wheels)  OT Visit Diagnosis: Unsteadiness on feet (R26.81);Other abnormalities of gait and mobility (R26.89);Muscle weakness (generalized) (M62.81);Pain;Hemiplegia and hemiparesis Hemiplegia - Right/Left: Right Hemiplegia - dominant/non-dominant: Dominant Hemiplegia - caused by: Unspecified   Activity Tolerance Patient tolerated treatment well   Patient Left in chair;with call bell/phone within reach;with nursing/sitter in room   Nurse Communication Mobility status        Time: 6237-6283 OT Time Calculation (min): 44 min  Charges: OT General Charges $OT Visit: 1 Visit OT Treatments $Self Care/Home Management : 38-52 mins  Koray Soter H. OTR/L Supplemental OT, Department of rehab services [phone]  [name] R H. 09/25/2021, 10:14 AM

## 2021-09-25 NOTE — Progress Notes (Signed)
Progress Note  Patient: [name] [id number] DOB: [birth year] DOA: 09/16/2021  DOS: 09/25/2021  LOS: 6   Brief hospital course: [name] is a 71 y.o. 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  She felt similar to when she had previous stroke and hence came to the ED. Of note, patient recently had diarrhea.  Her neurologist at [facility] advised her to hold CellCept for 10 days after which diarrhea resolved.   In the ED, her blood pressure was elevated to 173/70 CT head was negative. Serum glucose was elevated to 239. SARS-CoV-2 and influenza PCR negative. Chest x-ray negative for focal consolidation, edema, effusion. CT head without contrast negative for acute intracranial findings. Admitted to hospitalist service Neurology consulted.  Assessment and Plan: Right facial numbness- (present on admission) History of left thalamic infarct with mild right-sided residual weakness Symptomatic left carotid stenosis: s/p TCAR 09/24/2021. DC A line.  - Continue ASA, plavix (bare minimum 1 month) - Continue high-intensity statin + zetia.  - BP management per [facility].   - Deescalate pain control to orals.  - Continue PT/OT, stable for home venue of care at discharge.  Hypokalemia: Resolved.  Slow orally, check magnesium.  Hold HCTZ.  Hypothyroidism: Last TSH was wnl. Continue Synthroid.  Insulin dependent type 2 diabetes mellitus (HCC) - Pt does not have insulin pump with her any longer, it is at home. Received decadron 10mg  with surgery yesterday, CBGs up. Will augment to glargine 50u, 8u TIDWC (from 5), continue mod  SSI  Primary hypertension- (present on admission) Hold antihypertensives for now to allow for permissive hypertension.  Hyperlipidemia associated with type 2 diabetes mellitus ([place])- (present on admission) Continue rosuvastatin and Zetia.  OSA (obstructive sleep apnea)- (present on admission) Continue CPAP nightly.  Myasthenia gravis ([place])- (present on admission) Follows with [place] neurology.  CellCept recently held due to diarrhea which has since resolved.  Continue chronic prednisone 10 mg daily.  She receives Rituxan and IVIG infusions through [facility].  Urinary retention: Postoperatively. s/p IO - Monitor UOP with bladder scan. D/w RN.  Subjective: Continues to struggle with pain control. Getting IV dilaudid but agrees to trial oral equivalent today. Retained urine last night, not something she does regularly.   Objective: Vitals:   09/25/21 0404 09/25/21 0808 09/25/21 1200 09/25/21 1500  BP: (!) 145/54 (!) 137/59 138/67 (!) 142/63  Pulse: 88 80 79 92  Resp: 12 20 12 15   Temp: 98.4 F (36.9 C) 98.4 F (36.9 C) 98 F (36.7 C) 98.3 F (36.8 C)  TempSrc: Oral Oral Oral Oral  SpO2: 99% 97%  97%  Weight:      Height:       Gen: Drowsy female in no distress Pulm: Nonlabored and clear.  CV: RRR no JVD or murmur GI: Soft, NT, ND Ext: Warm, dry, no deformities Skin: Subcutaneous ecchymosis without fluctuance within demarcations surrounding/superior to TCAR site.  Neuro: Drowsy but rousable, no new deficits. Psych: Judgement and insight appear fair. Mood euthymic & affect congruent. Behavior is appropriate.    Data Personally reviewed: Hgb down 11 > 10 > 9.9 K better 3.6, SCr stable 1.12. SARS-CoV-2 PCR 1/24 negative    Family  Communication: None at bedside  Disposition: Status is: Inpatient Remains inpatient appropriate because: TCAR 2/1 POD #1, anticipate DC tomorrow Planned Discharge Destination: Home with [place], MD 09/25/2021 5:57 PM Page  by [email or web address]

## 2021-09-25 NOTE — Progress Notes (Addendum)
PT Cancellation Note  Patient Details Name: [name] MRN: [id number] DOB: 09-May-1951   Cancelled Treatment:    Reason Eval/Treat Not Completed: Patient's level of consciousness Pt declined mobility this AM due to being drowsy from pain meds. Will follow up as time allows.  Attempted 2 more times but pt too lethargic to participate and declines trying. Will follow up next available time.    Marguarite Arbour A Suvan Stcyr 09/25/2021, 10:01 AM Marisa Severin, PT, DPT Acute Rehabilitation Services Pager [phone] Office [phone]

## 2021-09-25 NOTE — Progress Notes (Signed)
The hardened site above the incision on the left of the neck has appeared to have grown a bit in the past two hours.  The area has been marked.  Patient can still talk and doesn't affect her breathing.  The vascular surgeon, Dr. Stanford Breed, has been notified. Will continue to monitor.  Lupita Dawn, RN

## 2021-09-25 NOTE — Progress Notes (Addendum)
°  Progress Note    09/25/2021 7:42 AM 1 Day Post-Op  Subjective:  sitting up eating breakfast during my exam.  No trouble swallowing food.  Denies any further R sided weakness   Vitals:   09/25/21 0204 09/25/21 0404  BP: 138/60 (!) 145/54  Pulse: 83 88  Resp: 10 12  Temp: 97.7 F (36.5 C) 98.4 F (36.9 C)  SpO2: 100% 99%   Physical Exam: Lungs:  non labored Incisions:  L neck incision some local edema/ecchymosis but no firm hematoma Extremities:  moving all extremities well; R groin cath site without hematoma Neurologic:  still some grip strength deficit R arm  CBC    Component Value Date/Time   WBC 10.2 09/25/2021 0405   RBC 3.14 (L) 09/25/2021 0405   HGB 9.6 (L) 09/25/2021 0405   HCT 29.7 (L) 09/25/2021 0405   PLT 232 09/25/2021 0405   MCV 94.6 09/25/2021 0405   MCH 30.6 09/25/2021 0405   MCHC 32.3 09/25/2021 0405   RDW 13.2 09/25/2021 0405   LYMPHSABS 1.8 09/20/2021 0400   MONOABS 0.5 09/20/2021 0400   EOSABS 0.1 09/20/2021 0400   BASOSABS 0.0 09/20/2021 0400    BMET    Component Value Date/Time   NA 137 09/25/2021 0405   NA 143 05/31/2019 1101   K 4.3 09/25/2021 0405   CL 108 09/25/2021 0405   CO2 21 (L) 09/25/2021 0405   GLUCOSE 284 (H) 09/25/2021 0405   BUN 18 09/25/2021 0405   BUN 24 05/31/2019 1101   CREATININE 1.11 (H) 09/25/2021 0405   CALCIUM 8.1 (L) 09/25/2021 0405   GFRNONAA 54 (L) 09/25/2021 0405   GFRAA >60 12/28/2019 1159    INR    Component Value Date/Time   INR 0.9 04/08/2009 1425     Intake/Output Summary (Last 24 hours) at 09/25/2021 0742 Last data filed at 09/25/2021 0042 Gross per 24 hour  Intake 2115.99 ml  Output 860 ml  Net 1255.99 ml     Assessment/Plan:  71 y.o. female is s/p L TCAR due to symptomatic stenosis 1 Day Post-Op   Neuro exam stable compared to pre-op; No further R sided weakness by my exam L neck with some edema/bruising but no firm hematoma Continue aspirin, plavix, statin OOB today   Dagoberto Ligas, PA-C [facility] [phone] 09/25/2021 7:42 AM  VASCULAR STAFF ADDENDUM: I have independently interviewed and examined the patient. I agree with the above.  Looks good postop day 1 after left TCAR. Small left neck subcutaneous hematoma.  This can be observed. Neurologic exam is stable. Reviewed operative findings. Critical she continues dual antiplatelet therapy with aspirin and Plavix for at least 1 month. She feels a little rough today.  Anticipate she will be able to go home in the next 24 hours. Follow-up with me in 2 to 3 weeks to discuss right TCAR.  Yevonne Aline. Stanford Breed, MD [facility] Phone Number: [phone] 09/25/2021 12:15 PM

## 2021-09-26 LAB — GLUCOSE, CAPILLARY
Glucose-Capillary: 81 mg/dL (ref 70–99)
Glucose-Capillary: 241 mg/dL — ABNORMAL HIGH (ref 70–99)
Glucose-Capillary: 214 mg/dL — ABNORMAL HIGH (ref 70–99)
Glucose-Capillary: 95 mg/dL (ref 70–99)

## 2021-09-26 MED ORDER — IRBESARTAN 150 MG PO TABS
150.0000 mg | ORAL_TABLET | Freq: Every day | ORAL | Status: DC
  Administered 2021-09-26 – 2021-09-27 (×2): 150 mg via ORAL
  Filled 2021-09-26 (×2): qty 1

## 2021-09-26 NOTE — Progress Notes (Signed)
Physical Therapy Treatment Patient Details Name: [name] MRN: [id number] DOB: 08/17/1951 Today's Date: 09/26/2021   History of Present Illness 71 y.o. female who presented to ED 1/24 with chest pain, right facial and arm numbness. Work-up indicating evidence of flow reducing stenosis at the right more than left proximal ICA. Patient s/p TCAR 2/1. PMHx significant for seronegative myasthenia gravis, HTN, Hx of CVA, carotid artery stenosis and paraneoplastic retinopathy.    PT Comments    Pt making good progress with mobility. Eager for [employer] home tomorrow.    Recommendations for follow up therapy are one component of a multi-disciplinary discharge planning process, led by the attending physician.  Recommendations may be updated based on patient status, additional functional criteria and insurance authorization.  Follow Up Recommendations  Home health PT     Assistance Recommended at Discharge Intermittent Supervision/Assistance  Patient can return home with the following Help with stairs or ramp for entrance   Equipment Recommendations  None recommended by PT (Pt has a rolling walker at home)    Recommendations for Other Services       Precautions / Restrictions Precautions Precautions: Fall     Mobility  Bed Mobility Overal bed mobility: Modified Independent Bed Mobility: Supine to Sit, Sit to Supine     Supine to sit: Modified independent (Device/Increase time), HOB elevated Sit to supine: Modified independent (Device/Increase time), HOB elevated        Transfers Overall transfer level: Needs assistance Equipment used: Rolling walker (2 wheels) Transfers: Sit to/from Stand Sit to Stand: Supervision           General transfer comment: Assist for safety    Ambulation/Gait Ambulation/Gait assistance: Supervision Gait Distance (Feet): 150 Feet Assistive device: Rolling walker (2 wheels) Gait Pattern/deviations: Step-through pattern, Decreased stride  length Gait velocity: decr Gait velocity interpretation: <1.31 ft/sec, indicative of household ambulator   General Gait Details: Assist for safety. No loss of balance.   Stairs Stairs: Yes Stairs assistance: Min guard Stair Management: Step to pattern, With walker, Forwards Number of Stairs: 1 General stair comments: Used portable step and walker   Wheelchair Mobility    Modified Rankin (Stroke Patients Only) Modified Rankin (Stroke Patients Only) Pre-Morbid Rankin Score: Slight disability Modified Rankin: Moderately severe disability     Balance Overall balance assessment: Needs assistance Sitting-balance support: Feet supported Sitting balance-Leahy Scale: Good     Standing balance support: During functional activity, No upper extremity supported Standing balance-Leahy Scale: Fair                              Cognition Arousal/Alertness: Awake/alert Behavior During Therapy: WFL for tasks assessed/performed Overall Cognitive Status: Within Functional Limits for tasks assessed                                          Exercises      General Comments General comments (skin integrity, edema, etc.): VSS on RA. HR 109 with activity      Pertinent Vitals/Pain Pain Assessment Pain Assessment: No/denies pain    Home Living                          Prior Function            PT Goals (current goals can now be found in  the care plan section) Progress towards PT goals: Progressing toward goals    Frequency    Min 4X/week      PT Plan Discharge plan needs to be updated;Frequency needs to be updated    Co-evaluation              AM-PAC PT "6 Clicks" Mobility   Outcome Measure  Help needed turning from your back to your side while in a flat bed without using bedrails?: None Help needed moving from lying on your back to sitting on the side of a flat bed without using bedrails?: None Help needed moving to and  from a bed to a chair (including a wheelchair)?: A Little Help needed standing up from a chair using your arms (e.g., wheelchair or bedside chair)?: A Little Help needed to walk in hospital room?: A Little Help needed climbing 3-5 steps with a railing? : A Little 6 Click Score: 20    End of Session   Activity Tolerance: Patient tolerated treatment well Patient left: in bed;with call bell/phone within reach Nurse Communication: Mobility status PT Visit Diagnosis: Other abnormalities of gait and mobility (R26.89)     Time: 7340-3709 PT Time Calculation (min) (ACUTE ONLY): 15 min  Charges:  $Gait Training: 8-22 mins                     [place] Pager [phone] Office [place] 09/26/2021, 3:30 PM

## 2021-09-26 NOTE — Progress Notes (Signed)
Progress Note  Patient: [name] DZH:[id number] DOB: 1951/04/08 DOA: 09/16/2021  DOS: 09/26/2021  LOS: 7   Brief hospital course: MAIRIM BADE is a 71 y.o. 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  She felt similar to when she had previous stroke and hence came to the ED. Of note, patient recently had diarrhea.  Her neurologist at [facility] advised her to hold CellCept for 10 days after which diarrhea resolved.   In the ED, her blood pressure was elevated to 173/70 CT head was negative. Serum glucose was elevated to 239. SARS-CoV-2 and influenza PCR negative. Chest x-ray negative for focal consolidation, edema, effusion. CT head without contrast negative for acute intracranial findings. Admitted to hospitalist service Neurology consulted.  Assessment and Plan: Right facial numbness  History of left thalamic infarct with mild right-sided residual weakness Symptomatic left carotid stenosis: s/p TCAR 09/24/2021. DC A line.  - Continue ASA, plavix (bare minimum 1 month), certainly will plan longer.  - Follow up with [facility] after discharge to arrange intervention on right carotid stenosis. - Continue high-intensity statin + zetia.  - BP management per [facility].   - Deescalated pain control to orals with better tolerance.   - Continue PT/OT who recommended inpatient rehab though none are accepting patient and she's improving mobilitywise, will be stable for home venue of care at discharge. Will need supervision which is being arranged.   Hypokalemia: Resolved.  - Restart ARB with higher BPs. Plan to restart HCTZ if BP stable/high after  this.  Hypothyroidism: Last TSH was wnl. Continue Synthroid.  Insulin dependent type 2 diabetes mellitus (HCC) - Pt does not have insulin pump with her any longer, it is at home. Received decadron 10mg  with surgery yesterday, CBGs up. Will augment to glargine 50u, 8u TIDWC (from 5), continue mod SSI  Primary hypertension- (present on admission) Hold antihypertensives for now to allow for permissive hypertension.  Hyperlipidemia associated with type 2 diabetes mellitus ([place])- (present on admission) Continue rosuvastatin and Zetia.  OSA (obstructive sleep apnea)- (present on admission) Continue CPAP nightly.  Myasthenia gravis ([place])- (present on admission) Follows with [place] neurology.  CellCept recently held due to diarrhea which has since resolved.  Continue chronic prednisone 10 mg daily.  She receives Rituxan and IVIG infusions through [facility].  Urinary retention: Postoperatively. s/p I/O. This has resolved.   Subjective: More alert today, oriented, amenable to discharge home but has had trouble arranging for people to be there to assist. Says she can have this done by tomorrow. Says vascular surgery told her discharge tomorrow is best. Pain is controlled. She got up to [facility] chair. No bleeding or new neurological problems.  Objective: Vitals:   09/25/21 2330 09/26/21 0324 09/26/21 0814 09/26/21 1104  BP: (!) 133/59 134/63 (!) 150/70 (!) 155/72  Pulse: 96 95 85 86  Resp: 12 18 12 18   Temp: 98.4 F (36.9 C) 98.8 F (37.1 C) 97.7 F (36.5 C) 98.6 F (37 C)  TempSrc: Oral Oral Oral Oral  SpO2: 95% 95% 96% 97%  Weight:  86 kg    Height:       Gen: 71 y.o. female in no distress Pulm: Nonlabored breathing room  air. Clear. CV: Regular rate and rhythm. No murmur, rub, or gallop. No JVD, no pitting dependent edema. GI: Abdomen soft, non-tender, non-distended, with normoactive bowel sounds.  Ext: Warm, no deformities Skin: Left transverse TCAR incision site without active bleeding or  erythema. Superiorly directed ecchymosis that is dense without fluctuance and tender is stable from prior exams. No other lesions or ulcers on visualized skin. Neuro: Alert and oriented. No new focal neurological deficits. Psych: Judgement and insight appear fair. Mood euthymic & affect congruent. Behavior is appropriate.    Data Personally reviewed: Hgb down 11 > 10 > 9.9 > 9.6 K better 4.3, SCr stable 1.11. SARS-CoV-2 PCR 1/24 negative    Family Communication: None at bedside  Disposition: Status is: Inpatient Remains inpatient appropriate because: TCAR 2/1 POD #1, anticipate DC once home health and supervision have been arranged. Pt should be able to complete this by 2/4.  Planned Discharge Destination: Home with [place], MD 09/26/2021 11:29 AM Page by [email or web address]

## 2021-09-26 NOTE — Care Management Important Message (Signed)
Important Message  Patient Details  Name: [name] MRN: [id number] Date of Birth: [birth year]   Medicare Important Message Given:  Yes     Dawayne Patricia, RN 09/26/2021, 11:42 AM

## 2021-09-26 NOTE — TOC Transition Note (Addendum)
Transition of Care (TOC) - CM/SW Discharge Note Marvetta Gibbons RN, BSN Transitions of Care Unit 4E- RN Case Manager See Treatment Team for direct phone #    Patient Details  Name: LOREL LEMBO MRN: [id number] Date of Birth: [birth year]  Transition of Care [facility] CM/SW Contact:  Dawayne Patricia, RN Phone Number: 09/26/2021, 11:45 AM   Clinical Narrative:    Notified by MD that pt should be medically ready to transition home tomorrow with [facility].  Orders placed for HHRN/PT/OT/aide. CM in to speak with pt at bedside. Discussed both HH and DME needs. Pt has been denied INPT rehab stays and is not interested in STSNF- prefers to return home with [facility] Per pt she has needed DME at home- states she is working on getting 4-prong cane- which has been on back order. No new DME needs noted.  List provided to pt for [facility] choice Per CMS guidelines from medicare.gov website with star ratings (copy placed in shadow chart) , pt has selected [facility] has her first choice, with [facility]) as back up. Per pt she is agreeable to RN/PT/OT- states she does not need the aide. Pt is working on arranging assistance at home in addition to [facility] services.  Address, phone # and PCP all confirmed with pt in epic. Pt also reports she will have transportation home.   Call made to Amy with [facility] for [facility] referral- referral has been accepted- for HHRN/PT/OT (pt declined aide).    Final next level of care: [place] Barriers to Discharge: Barriers Resolved   Patient Goals and CMS Choice Patient states their goals for this hospitalization and ongoing recovery are:: will return home, working on arranging assistance CMS Medicare.gov Compare Post Acute Care list provided to:: Patient Choice offered to / list presented to : Patient  Discharge Placement               Home w/ [facility]        Discharge Plan and Services   Discharge Planning Services: CM Consult Post Acute Care Choice: Home Health           DME Arranged: N/A DME Agency: NA       HH Arranged: RN, Disease Management, PT, OT HH Agency: Patrick Date [facility] Agency Contacted: 09/26/21 Time [place]: 0539 Representative spoke with at [place]: Amy  Social Determinants of Health ([place]) Interventions     Readmission Risk Interventions Readmission Risk Prevention Plan 09/26/2021  Transportation Screening Complete  Medication Review Press photographer) Complete  PCP or Specialist appointment within 3-5 days of discharge Complete  HRI or [place] Complete  SW Recovery Care/Counseling Consult Complete  [place] Patient Refused  Some recent data might be hidden

## 2021-09-26 NOTE — Plan of Care (Signed)
°  Problem: Nutrition: Goal: Adequate nutrition will be maintained Outcome: Progressing   Problem: Coping: Goal: Level of anxiety will decrease Outcome: Progressing   Problem: Education: Goal: Knowledge of disease or condition will improve Outcome: Progressing

## 2021-09-26 NOTE — Progress Notes (Signed)
Pt refusing to wear CPAP.

## 2021-09-26 NOTE — Progress Notes (Addendum)
°  Progress Note    09/26/2021 7:45 AM 2 Days Post-Op  Subjective:  No further R arm weakness.  Feels more alert today since she has been weaned from pain medicine   Vitals:   09/25/21 2330 09/26/21 0324  BP: (!) 133/59 134/63  Pulse: 96 95  Resp: 12 18  Temp: 98.4 F (36.9 C) 98.8 F (37.1 C)  SpO2: 95% 95%   Physical Exam: Lungs:  non labored Incisions:  L neck incision some local ecchymosis but stable  Extremities:  moving all extremities well Neurologic: R hand grip strength mild deficit  CBC    Component Value Date/Time   WBC 10.2 09/25/2021 0405   RBC 3.14 (L) 09/25/2021 0405   HGB 9.6 (L) 09/25/2021 0405   HCT 29.7 (L) 09/25/2021 0405   PLT 232 09/25/2021 0405   MCV 94.6 09/25/2021 0405   MCH 30.6 09/25/2021 0405   MCHC 32.3 09/25/2021 0405   RDW 13.2 09/25/2021 0405   LYMPHSABS 1.8 09/20/2021 0400   MONOABS 0.5 09/20/2021 0400   EOSABS 0.1 09/20/2021 0400   BASOSABS 0.0 09/20/2021 0400    BMET    Component Value Date/Time   NA 137 09/25/2021 0405   NA 143 05/31/2019 1101   K 4.3 09/25/2021 0405   CL 108 09/25/2021 0405   CO2 21 (L) 09/25/2021 0405   GLUCOSE 284 (H) 09/25/2021 0405   BUN 18 09/25/2021 0405   BUN 24 05/31/2019 1101   CREATININE 1.11 (H) 09/25/2021 0405   CALCIUM 8.1 (L) 09/25/2021 0405   GFRNONAA 54 (L) 09/25/2021 0405   GFRAA >60 12/28/2019 1159    INR    Component Value Date/Time   INR 0.9 04/08/2009 1425     Intake/Output Summary (Last 24 hours) at 09/26/2021 0745 Last data filed at 09/26/2021 0326 Gross per 24 hour  Intake 0 ml  Output 350 ml  Net -350 ml     Assessment/Plan:  71 y.o. female is s/p L TCAR 2 Days Post-Op   Neuro exam remains stable L neck incision is soft and appears improved over the past 24h Ok for d/c from vascular standpoint She will follow up with Dr. Stanford Breed in 2 weeks to discuss R sided TCAR   Dagoberto Ligas, PA-C [facility] [phone] 09/26/2021 7:45 AM  VASCULAR  STAFF ADDENDUM: I have independently interviewed and examined the patient. I agree with the above.   Yevonne Aline. Stanford Breed, MD [facility] Phone Number: [phone] 09/26/2021 10:49 AM

## 2021-09-27 LAB — GLUCOSE, CAPILLARY
Glucose-Capillary: 71 mg/dL (ref 70–99)
Glucose-Capillary: 287 mg/dL — ABNORMAL HIGH (ref 70–99)

## 2021-09-27 MED ORDER — HEPARIN SOD (PORK) LOCK FLUSH 100 UNIT/ML IV SOLN
500.0000 [IU] | INTRAVENOUS | Status: AC | PRN
  Administered 2021-09-27: 500 [IU]
  Filled 2021-09-27: qty 5

## 2021-09-27 MED ORDER — CLOPIDOGREL BISULFATE 75 MG PO TABS: 75.0000 mg | ORAL_TABLET | Freq: Every day | ORAL | 0 refills | Status: DC

## 2021-09-27 NOTE — Discharge Summary (Signed)
Physician Discharge Summary   Patient: [name] MRN: [id number] DOB: [birth year]  Admit date:     09/16/2021  Discharge date: 09/27/2021  Discharge Physician: Patrecia Pour   PCP: [name], DO   Recommendations at discharge:  Follow up with vascular surgery Follow up with neurology  Discharge Diagnoses: Principal Problem:   Right facial numbness Active Problems:   Myasthenia gravis (HCC)   OSA (obstructive sleep apnea)   Bilateral carotid artery stenosis   Hyperlipidemia associated with type 2 diabetes mellitus (HCC)   Primary hypertension   Insulin dependent type 2 diabetes mellitus (HCC)   Hypothyroidism   Hypokalemia   Stroke-like symptoms   TIA (transient ischemic attack)  Resolved Problems:   * No resolved hospital problems. *   Hospital Course: Per HPI: [name] is a 71 y.o. 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  She felt similar to when she had previous stroke and hence came to the ED. Of note, patient recently had diarrhea.  Her neurologist at [facility] advised her to hold CellCept for 10 days after which diarrhea resolved.   In the ED, her blood pressure was elevated to 173/70 CT head was negative. Serum glucose was elevated to 239. SARS-CoV-2 and influenza PCR negative. Chest x-ray negative for focal consolidation, edema, effusion. CT head without contrast negative for acute intracranial findings. Admitted to hospitalist service Neurology consulted.  Please see problem-based discussion of events and plan below.  Assessment and Plan: Right facial numbness  History of left thalamic  infarct with mild right-sided residual weakness Symptomatic left carotid stenosis: s/p TCAR 09/24/2021. DC A line.  - Continue ASA, plavix (bare minimum 1 month), certainly will plan longer.  - Follow up with [facility] after discharge to arrange intervention on right carotid stenosis. - Continue high-intensity statin + zetia. Lipid clinic referral was made. - Deescalated pain control, only requiring tylenol now. - Continue PT/OT who recommended inpatient rehab though none are accepting patient and she's improving mobilitywise, will be stable for home venue of care at discharge. Will need supervision which is arranged.    Hypokalemia: Resolved.  - Restart home meds for BP   Hypothyroidism: Last TSH was wnl. - Continue Synthroid.   Insulin dependent type 2 diabetes mellitus (HCC) - Restart insulin pump   Primary hypertension: Restart home medications   Hyperlipidemia associated with type 2 diabetes mellitus ([place])- (present on admission) Continue rosuvastatin and Zetia.   OSA (obstructive sleep apnea)- (present on admission) Continue CPAP nightly.   Myasthenia gravis ([place])- (present on admission) Follows with [place] neurology.  CellCept recently held due to diarrhea which has since resolved.  Continue chronic prednisone 10 mg daily.  She receives Rituxan and IVIG infusions through [facility].   Urinary retention: Postoperatively. s/p I/O. This has resolved.   Consultants: Vascular surgery, neurology Procedures performed: TCAR (left) 09/24/2021  Disposition: Home Diet recommendation:  Discharge Diet Orders (From admission, onward)     Start     Ordered   09/27/21 0000  Diet - low sodium heart healthy        09/27/21 0755   09/27/21 0000  Diet Carb Modified  09/27/21 0755           Cardiac and Carb modified diet  DISCHARGE MEDICATION: Allergies as of 09/27/2021       Reactions   Contrast Media [iodinated Contrast Media] Anaphylaxis   01/10/15 and 09/18/2021 --PT GIVEN 13 HR PRE MEDS  FOR CT with contrast TOLERATED IV CONTRAST W/O ANY REACTION   Fluorescein Shortness Of Breath, Other (See Comments)   (Dye)   Iodine Anaphylaxis   Contrast dye - iodine   Molds & Smuts Shortness Of Breath, Other (See Comments)   Congestion and wheezing, also   Shellfish-derived Products Anaphylaxis, Shortness Of Breath, Swelling, Other (See Comments)   Welts, also   Azithromycin Nausea Only   Codeine Hives, Nausea Only   Metformin Hcl Er Nausea Only   Tramadol Hcl Nausea Only   Other Rash, Other (See Comments)   Coban causes welts, also        Medication List     TAKE these medications    acetaminophen 500 MG tablet Commonly known as: TYLENOL Take 500 mg by mouth every 6 (six) hours as needed (pain).   aspirin 81 MG EC tablet Take 1 tablet (81 mg total) by mouth daily.   B-D ULTRAFINE III SHORT PEN 31G X 8 MM Misc Generic drug: Insulin Pen Needle   Biotin 5000 MCG Caps Take 5,000 mcg by mouth daily with breakfast.   Bydureon BCise 2 MG/0.85ML Auij Generic drug: Exenatide ER Inject 2 mg into the skin every Monday.   clopidogrel 75 MG tablet Commonly known as: PLAVIX Take 1 tablet (75 mg total) by mouth daily.   cycloSPORINE 0.05 % ophthalmic emulsion Commonly known as: RESTASIS Place 1 drop into both eyes 2 (two) times daily.   diclofenac Sodium 1 % Gel Commonly known as: VOLTAREN Apply 2 g topically 4 (four) times daily. What changed:  how much to take when to take this reasons to take this   diphenhydrAMINE 25 mg in sodium chloride 0.9 % 50 mL Inject 25 mg into the vein See admin instructions. Administer 25 mg intravenously at start of Gamunex infusion, then administer 25 mg during infusion   Elderberry 500 MG Caps Take 500 mg by mouth daily.   empagliflozin 25 MG Tabs tablet Commonly known as: JARDIANCE Take 25 mg by mouth daily.   esomeprazole 40 MG capsule Commonly known as: NEXIUM Take 1 capsule (40 mg total) by mouth 2 (two) times daily.    ezetimibe 10 MG tablet Commonly known as: ZETIA TAKE 1 TABLET BY MOUTH EVERY DAY   Immune Globulin (Human) 40 GM/400ML Soln Infusions are administered at home by home health nurse - last infusion 09/08/21 and 09/09/21; next infusion due 10/19/21 and 10/20/21   levothyroxine 150 MCG tablet Commonly known as: SYNTHROID Take 150 mcg by mouth daily before breakfast.   lidocaine 5 % Commonly known as: LIDODERM Place 1 patch onto the skin daily. Remove & Discard patch within 12 hours or as directed by MD What changed:  when to take this reasons to take this   loratadine 10 MG tablet Commonly known as: CLARITIN Take 1 tablet (10 mg total) by mouth at bedtime.   meclizine 25 MG tablet Commonly known as: ANTIVERT Take 25 mg by mouth 3 (three) times daily as needed for dizziness (or migraine-related nausea).   montelukast 10 MG tablet Commonly known as: SINGULAIR Take 1 tablet (10 mg total) by mouth every morning.   mycophenolate 500 MG tablet Commonly known as: CELLCEPT Take  1,500 mg by mouth 2 (two) times daily. For myasthenia gravis   olmesartan 20 MG tablet Commonly known as: BENICAR Take 1 tablet (20 mg total) by mouth daily.   Omnipod DASH Pods (Gen 4) Misc Use with Humalog   predniSONE 10 MG tablet Commonly known as: DELTASONE Take 10 mg by mouth every morning. For myasthenia gravis   PRESCRIPTION MEDICATION Inhale into the lungs at bedtime. CPAP   ProAir HFA 108 (90 Base) MCG/ACT inhaler Generic drug: albuterol Inhale 2 puffs into the lungs every 6 (six) hours as needed for wheezing or shortness of breath.   Rituxan 500 MG/50ML injection Generic drug: riTUXimab Inject into the vein every 6 (six) months.   rosuvastatin 40 MG tablet Commonly known as: CRESTOR Take 1 tablet (40 mg total) by mouth daily.   spironolactone-hydrochlorothiazide 25-25 MG tablet Commonly known as: Aldactazide Take 1 tablet by mouth every morning.   topiramate 50 MG tablet Commonly  known as: TOPAMAX Take 1 tablet (50 mg total) by mouth daily. Start 1 tablet daily x 1 week and then twice daily What changed:  when to take this additional instructions   vitamin B-12 1000 MCG tablet Commonly known as: CYANOCOBALAMIN Take 1,000 mcg by mouth daily.   Vitamin D (Ergocalciferol) 1.25 MG (50000 UNIT) Caps capsule Commonly known as: DRISDOL Take 50,000 Units by mouth every Monday.               Discharge Care Instructions  (From admission, onward)           Start     Ordered   09/27/21 0000  No dressing needed        09/27/21 0755            Follow-up Information     [facility]. Schedule an appointment as soon as possible for a visit in 1 month(s).   Specialty: Neurology Why: stroke clinic Contact information: [address] [place], [facility] Follow up.   Specialty: Home Health Services Why: Go by [facility] now- HHRN/PT/OT arranged- they will contact you within 48 hr post discharge to schedule initial home visit for start of care. Contact information: [place] [address] [phone]         [name], DO. Schedule an appointment as soon as possible for a visit.   Specialty: Family Medicine Contact information: [address] [place] [address] [phone]         Cherre Robins, MD. Go on 10/07/2021.   Specialties: Vascular Surgery, Interventional Cardiology Why: 8:40am Contact information: [address] [place] [address] [phone]                Subjective: Amenable to discharge today. Pain is controlled. Discharge Exam: Filed Weights   09/24/21 1038 09/24/21 1427 09/26/21 0324  Weight: 83 kg 83 kg 86 kg  BP (!) 126/56 (BP Location: Right Arm)    Pulse (!) 108    Temp 99.1 F (37.3 C) (Oral)    Resp 18    Ht 5\' 8"  (1.727 m)    Wt 86 kg    SpO2 95%    BMI 28.83 kg/m   Gen:  71 y.o. female in no distress Pulm: Nonlabored breathing room air. Clear. CV: Regular rate and rhythm. No murmur, rub, or gallop. No JVD, no pitting dependent edema. GI: Abdomen soft, non-tender, non-distended, with normoactive bowel sounds.  Ext: Warm, no deformities Skin: Left transverse TCAR incision site without active bleeding or erythema. Superiorly directed ecchymosis that is dense without fluctuance and tender is stable from prior exams. No other lesions or ulcers on visualized skin. Neuro: Alert and oriented. No new focal neurological deficits.  Condition at discharge: stable  The results of significant diagnostics from this hospitalization (including imaging, microbiology, ancillary and laboratory) are listed below for reference.   Imaging Studies: CT ANGIO HEAD NECK W WO CM  Result Date: 09/18/2021 CLINICAL DATA:  Stroke/TIA, determinate bulk source EXAM: CT ANGIOGRAPHY HEAD AND NECK TECHNIQUE: Multidetector CT imaging of the head and neck was performed using the standard protocol during bolus administration of intravenous contrast. Multiplanar CT image reconstructions and MIPs were obtained to evaluate the vascular anatomy. Carotid stenosis measurements (when applicable) are obtained utilizing NASCET criteria, using the distal internal carotid diameter as the denominator. RADIATION DOSE REDUCTION: This exam was performed according to the departmental dose-optimization program which includes automated exposure control, adjustment of the mA and/or kV according to patient size and/or use of iterative reconstruction technique. CONTRAST:  68mL OMNIPAQUE IOHEXOL 350 MG/ML SOLN COMPARISON:  CT head 09/16/2021, MRA head and neck 09/17/2021 FINDINGS: CT HEAD FINDINGS Brain: There is no evidence of acute intracranial hemorrhage, extra-axial fluid collection, or acute infarct. Parenchymal volume is normal. The ventricles are normal in size. The parenchyma is normal in appearance. There is no mass  lesion. There is no midline shift. Vascular: See below. Skull: Normal. Negative for fracture or focal lesion. Sinuses: There is mucosal thickening in the left sphenoid sinus with surrounding hyperostosis consistent with chronic sinusitis. Orbits: Bilateral lens implants are in place. The globes and orbits are otherwise unremarkable. Review of the MIP images confirms the above findings CTA NECK FINDINGS Aortic arch: There is mild calcified atherosclerotic plaque of the aortic arch. The origins of the major branch vessels are patent. The subclavian arteries are patent. Right carotid system: The right common carotid artery is patent. There is predominantly soft plaque in the proximal right ICA resulting in approximally 80-90% stenosis. The distal right internal carotid artery is widely patent. Right external carotid artery is patent. There is no evidence of dissection or aneurysm. Left carotid system: The left common carotid artery is patent. There is mixed plaque at the left carotid bulb resulting in up to approximately 60% stenosis. The distal left internal carotid artery is patent. Left external carotid artery is patent. There is no dissection or aneurysm. Vertebral arteries: There is minimal plaque at the origin of the right vertebral artery without significant stenosis. The vertebral arteries are patent, without hemodynamically significant stenosis or occlusion. There is no dissection or aneurysm. Skeleton: The patient is status post C3-C4 ACDF. There is also osseous fusion of the C5 through C7 vertebral bodies there is adjacent segment disease at C2-C3 with probable moderate spinal canal stenosis. There is no visible canal hematoma. There is no acute osseous abnormality or aggressive osseous lesion. Other neck: The soft tissues are unremarkable. Upper chest: There is interlobular septal thickening and patchy ground-glass opacity in the lung apices. A right chest wall port is partially imaged. Review of the MIP  images confirms the above findings CTA HEAD FINDINGS Anterior circulation: There is mild calcified atherosclerotic plaque in the bilateral intracranial ICAs without hemodynamically significant stenosis. The bilateral MCAs are patent. The bilateral ACAs are patent. The anterior communicating artery is normal. There is no aneurysm or AVM. Posterior circulation: The bilateral V4 segments are patent. PICA is  identified bilaterally. The basilar artery is patent. The bilateral PCAs are patent. Small posterior communicating arteries are identified. There is no aneurysm or AVM Venous sinuses: Patent. Anatomic variants: None. Review of the MIP images confirms the above findings IMPRESSION: 1. No acute intracranial pathology. 2. Predominantly soft plaque in the proximal right ICA resulting in approximately 80-90% stenosis. 3. Mixed plaque in the proximal left ICA results in approximately 60% stenosis. 4. Mild atherosclerotic plaque in the intracranial ICAs without hemodynamically significant stenosis. Otherwise, patent intracranial vasculature. 5. Interlobular septal thickening and patchy ground-glass opacities in the lung apices may reflect mild pulmonary interstitial edema. 6.  Aortic Atherosclerosis (ICD10-I70.0). Electronically Signed   By: Valetta Mole M.D.   On: 09/18/2021 09:17   DG Chest 2 View  Result Date: 09/16/2021 CLINICAL DATA:  Chest pain and short of breath. EXAM: CHEST - 2 VIEW COMPARISON:  12/19/2019 FINDINGS: Cardiac and mediastinal contours normal. Lungs are well aerated and clear. No infiltrate or effusion. Port-A-Cath tip in the SVC at the cavoatrial junction unchanged. IMPRESSION: No active cardiopulmonary disease. Electronically Signed   By: Franchot Gallo M.D.   On: 09/16/2021 16:07   CT Head Wo Contrast  Result Date: 09/16/2021 CLINICAL DATA:  Neurological deficit EXAM: CT HEAD WITHOUT CONTRAST TECHNIQUE: Contiguous axial images were obtained from the base of the skull through the vertex  without intravenous contrast. RADIATION DOSE REDUCTION: This exam was performed according to the departmental dose-optimization program which includes automated exposure control, adjustment of the mA and/or kV according to patient size and/or use of iterative reconstruction technique. COMPARISON:  MR brain done on 11/09/2020 FINDINGS: Brain: No acute intracranial findings are seen. There are no signs of bleeding within the cranium. Cortical sulci are prominent. There is decreased density in the periventricular white matter. Vascular: Unremarkable. Skull: Unremarkable. Sinuses/Orbits: Mucosal thickening and mucous retention cysts are seen in the sphenoid and maxillary sinuses. Other: None IMPRESSION: No acute intracranial findings are seen.  Atrophy. Chronic sinusitis. Electronically Signed   By: Elmer Picker M.D.   On: 09/16/2021 17:14   MR ANGIO HEAD WO CONTRAST  Result Date: 09/17/2021 CLINICAL DATA:  Stroke follow-up. Right facial numbness and arm numbness EXAM: MRI HEAD WITHOUT CONTRAST MRA HEAD WITHOUT CONTRAST MRA NECK WITHOUT CONTRAST TECHNIQUE: Multiplanar, multiecho pulse sequences of the brain and surrounding structures were obtained without intravenous contrast. Angiographic images of the Circle of Willis were obtained using MRA technique without intravenous contrast. Angiographic images of the neck were obtained using MRA technique without intravenous contrast. Carotid stenosis measurements (when applicable) are obtained utilizing NASCET criteria, using the distal internal carotid diameter as the denominator. COMPARISON:  Head CT from yesterday.  Brain MRI 11/09/2020 FINDINGS: MRI HEAD FINDINGS Brain: No acute infarction, hemorrhage, hydrocephalus, extra-axial collection or mass lesion. Vascular: See below Skull and upper cervical spine: Normal marrow signal. C3-4 and C4-5 cervical spine fusion. Central protrusion and ligamentous thickening at C2-3 encroaching on the cord. No evident  progression - less prominent cord impingement is likely from slice selection. Sinuses/Orbits: Bilateral cataract resection. Inspissated material in the left sphenoid sinus, chronic. Retention cysts in the right maxillary sinus MRA HEAD FINDINGS No branch occlusion, beading, flow limiting stenosis, or aneurysm. Mild atheromatous irregularity to medium size branches. MRA NECK FINDINGS Antegrade flow in the carotid and vertebral arteries. Right vertebral artery is dominant. No evidence of proximal subclavian stenosis. The left vertebral artery appears to arise from the arch. Wasting of proximal right ICA lumen with 70% measured stenosis, although likely  overestimated due to flow artifact. On the left, proximal ICA stenosis measures 60%. Irregularity of the ICA vessels at the skull base, likely artifact. No suspected flow limiting stenosis in the vertebral arteries. IMPRESSION: Brain MRI: No acute finding. Intracranial MRA: No emergent finding.  Mild atherosclerosis. Neck MRA: Evidence of flow reducing stenosis at the right more than left proximal ICA. Suggest CTA characterization given the limitations of noncontrast MRA. Electronically Signed   By: Jorje Guild M.D.   On: 09/17/2021 07:19   MR ANGIO NECK WO CONTRAST  Result Date: 09/17/2021 CLINICAL DATA:  Stroke follow-up. Right facial numbness and arm numbness EXAM: MRI HEAD WITHOUT CONTRAST MRA HEAD WITHOUT CONTRAST MRA NECK WITHOUT CONTRAST TECHNIQUE: Multiplanar, multiecho pulse sequences of the brain and surrounding structures were obtained without intravenous contrast. Angiographic images of the Circle of Willis were obtained using MRA technique without intravenous contrast. Angiographic images of the neck were obtained using MRA technique without intravenous contrast. Carotid stenosis measurements (when applicable) are obtained utilizing NASCET criteria, using the distal internal carotid diameter as the denominator. COMPARISON:  Head CT from yesterday.   Brain MRI 11/09/2020 FINDINGS: MRI HEAD FINDINGS Brain: No acute infarction, hemorrhage, hydrocephalus, extra-axial collection or mass lesion. Vascular: See below Skull and upper cervical spine: Normal marrow signal. C3-4 and C4-5 cervical spine fusion. Central protrusion and ligamentous thickening at C2-3 encroaching on the cord. No evident progression - less prominent cord impingement is likely from slice selection. Sinuses/Orbits: Bilateral cataract resection. Inspissated material in the left sphenoid sinus, chronic. Retention cysts in the right maxillary sinus MRA HEAD FINDINGS No branch occlusion, beading, flow limiting stenosis, or aneurysm. Mild atheromatous irregularity to medium size branches. MRA NECK FINDINGS Antegrade flow in the carotid and vertebral arteries. Right vertebral artery is dominant. No evidence of proximal subclavian stenosis. The left vertebral artery appears to arise from the arch. Wasting of proximal right ICA lumen with 70% measured stenosis, although likely overestimated due to flow artifact. On the left, proximal ICA stenosis measures 60%. Irregularity of the ICA vessels at the skull base, likely artifact. No suspected flow limiting stenosis in the vertebral arteries. IMPRESSION: Brain MRI: No acute finding. Intracranial MRA: No emergent finding.  Mild atherosclerosis. Neck MRA: Evidence of flow reducing stenosis at the right more than left proximal ICA. Suggest CTA characterization given the limitations of noncontrast MRA. Electronically Signed   By: Jorje Guild M.D.   On: 09/17/2021 07:19   MR BRAIN WO CONTRAST  Result Date: 09/17/2021 CLINICAL DATA:  Stroke follow-up. Right facial numbness and arm numbness EXAM: MRI HEAD WITHOUT CONTRAST MRA HEAD WITHOUT CONTRAST MRA NECK WITHOUT CONTRAST TECHNIQUE: Multiplanar, multiecho pulse sequences of the brain and surrounding structures were obtained without intravenous contrast. Angiographic images of the Circle of Willis were  obtained using MRA technique without intravenous contrast. Angiographic images of the neck were obtained using MRA technique without intravenous contrast. Carotid stenosis measurements (when applicable) are obtained utilizing NASCET criteria, using the distal internal carotid diameter as the denominator. COMPARISON:  Head CT from yesterday.  Brain MRI 11/09/2020 FINDINGS: MRI HEAD FINDINGS Brain: No acute infarction, hemorrhage, hydrocephalus, extra-axial collection or mass lesion. Vascular: See below Skull and upper cervical spine: Normal marrow signal. C3-4 and C4-5 cervical spine fusion. Central protrusion and ligamentous thickening at C2-3 encroaching on the cord. No evident progression - less prominent cord impingement is likely from slice selection. Sinuses/Orbits: Bilateral cataract resection. Inspissated material in the left sphenoid sinus, chronic. Retention cysts in the right maxillary sinus  MRA HEAD FINDINGS No branch occlusion, beading, flow limiting stenosis, or aneurysm. Mild atheromatous irregularity to medium size branches. MRA NECK FINDINGS Antegrade flow in the carotid and vertebral arteries. Right vertebral artery is dominant. No evidence of proximal subclavian stenosis. The left vertebral artery appears to arise from the arch. Wasting of proximal right ICA lumen with 70% measured stenosis, although likely overestimated due to flow artifact. On the left, proximal ICA stenosis measures 60%. Irregularity of the ICA vessels at the skull base, likely artifact. No suspected flow limiting stenosis in the vertebral arteries. IMPRESSION: Brain MRI: No acute finding. Intracranial MRA: No emergent finding.  Mild atherosclerosis. Neck MRA: Evidence of flow reducing stenosis at the right more than left proximal ICA. Suggest CTA characterization given the limitations of noncontrast MRA. Electronically Signed   By: Jorje Guild M.D.   On: 09/17/2021 07:19   ECHOCARDIOGRAM COMPLETE  Result Date:  09/17/2021    ECHOCARDIOGRAM REPORT   Patient Name:   EARNEST THALMAN Date of Exam: 09/17/2021 Medical Rec #:  [id number]       Height:       68.0 in Accession #:    [id number]      Weight:       183.0 lb Date of Birth:  [birth year]        BSA:          1.968 m Patient Age:    26 years        BP:           145/60 mmHg Patient Gender: F               HR:           80 bpm. Exam Location:  Inpatient Procedure: 2D Echo Indications:    Stroke  History:        Patient has prior history of Echocardiogram examinations, most                 recent 12/20/2019. Signs/Symptoms:Murmur; Risk                 Factors:Hypertension and Diabetes.  Sonographer:    Arlyss Gandy Referring Phys: [id number] [place]  1. Left ventricular ejection fraction, by estimation, is 65 to 70%. The left ventricle has hyperdynamic function. The left ventricle has no regional wall motion abnormalities. There is mild left ventricular hypertrophy. Left ventricular diastolic parameters are consistent with Grade I diastolic dysfunction (impaired relaxation).  2. Right ventricular systolic function is normal. The right ventricular size is normal. Tricuspid regurgitation signal is inadequate for assessing PA pressure.  3. The mitral valve is normal in structure. No evidence of mitral valve regurgitation. No evidence of mitral stenosis. Moderate mitral annular calcification.  4. The aortic valve is tricuspid. Aortic valve regurgitation is not visualized. Aortic valve sclerosis/calcification is present, without any evidence of aortic stenosis.  5. The inferior vena cava is normal in size with greater than 50% respiratory variability, suggesting right atrial pressure of 3 mmHg. FINDINGS  Left Ventricle: Left ventricular ejection fraction, by estimation, is 65 to 70%. The left ventricle has hyperdynamic function. The left ventricle has no regional wall motion abnormalities. The left ventricular internal cavity size was normal in size. There is mild  left ventricular hypertrophy. Left ventricular diastolic parameters are consistent with Grade I diastolic dysfunction (impaired relaxation). Right Ventricle: The right ventricular size is normal. No increase in right ventricular wall thickness. Right ventricular systolic function is normal. Tricuspid regurgitation  signal is inadequate for assessing PA pressure. Left Atrium: Left atrial size was normal in size. Right Atrium: Right atrial size was normal in size. Pericardium: Trivial pericardial effusion is present. Mitral Valve: The mitral valve is normal in structure. Moderate mitral annular calcification. No evidence of mitral valve regurgitation. No evidence of mitral valve stenosis. MV peak gradient, 7.1 mmHg. The mean mitral valve gradient is 3.0 mmHg. Tricuspid Valve: The tricuspid valve is normal in structure. Tricuspid valve regurgitation is not demonstrated. Aortic Valve: The aortic valve is tricuspid. Aortic valve regurgitation is not visualized. Aortic valve sclerosis/calcification is present, without any evidence of aortic stenosis. Aortic valve mean gradient measures 6.0 mmHg. Aortic valve peak gradient measures 11.2 mmHg. Aortic valve area, by VTI measures 2.60 cm. Pulmonic Valve: The pulmonic valve was normal in structure. Pulmonic valve regurgitation is not visualized. Aorta: The aortic root is normal in size and structure. Venous: The inferior vena cava is normal in size with greater than 50% respiratory variability, suggesting right atrial pressure of 3 mmHg. IAS/Shunts: No atrial level shunt detected by color flow Doppler.  LEFT VENTRICLE PLAX 2D LVIDd:         2.90 cm   Diastology LVIDs:         1.80 cm   LV e' medial:    4.90 cm/s LV PW:         1.30 cm   LV E/e' medial:  17.1 LV IVS:        1.30 cm   LV e' lateral:   5.77 cm/s LVOT diam:     2.00 cm   LV E/e' lateral: 14.5 LV SV:         73 LV SV Index:   37 LVOT Area:     3.14 cm  RIGHT VENTRICLE RV Basal diam:  2.50 cm RV Mid diam:    2.30  cm RV S prime:     14.30 cm/s TAPSE (M-mode): 1.4 cm LEFT ATRIUM             Index        RIGHT ATRIUM          Index LA diam:        2.70 cm 1.37 cm/m   RA Area:     9.50 cm LA Vol (A2C):   25.3 ml 12.86 ml/m  RA Volume:   16.70 ml 8.49 ml/m LA Vol (A4C):   30.6 ml 15.55 ml/m LA Biplane Vol: 29.3 ml 14.89 ml/m  AORTIC VALVE AV Area (Vmax):    2.75 cm AV Area (Vmean):   2.69 cm AV Area (VTI):     2.60 cm AV Vmax:           167.00 cm/s AV Vmean:          110.000 cm/s AV VTI:            0.279 m AV Peak Grad:      11.2 mmHg AV Mean Grad:      6.0 mmHg LVOT Vmax:         146.00 cm/s LVOT Vmean:        94.300 cm/s LVOT VTI:          0.231 m LVOT/AV VTI ratio: 0.83  AORTA Ao Root diam: 3.00 cm Ao Asc diam:  3.10 cm MITRAL VALVE MV Area (PHT): 3.37 cm     SHUNTS MV Area VTI:   2.46 cm     Systemic VTI:  0.23 m MV Peak grad:  7.1 mmHg     Systemic Diam: 2.00 cm MV Mean grad:  3.0 mmHg MV Vmax:       1.33 m/s MV Vmean:      77.8 cm/s MV Decel Time: 225 msec MV E velocity: 83.90 cm/s MV A velocity: 114.00 cm/s MV E/A ratio:  0.74 Dalton McleanMD Electronically signed by Franki Monte Signature Date/Time: 09/17/2021/4:06:39 PM    Final    Structural Heart Procedure  Result Date: 09/24/2021 See surgical note for result.  HYBRID OR IMAGING ([facility] ONLY)  Result Date: 09/24/2021 There is no interpretation for this exam.  This order is for images obtained during a surgical procedure.  Please See "Surgeries" Tab for more information regarding the procedure.    Microbiology: Results for orders placed or performed during the hospital encounter of 09/16/21  Resp Panel by RT-PCR (Flu A&B, Covid) Nasopharyngeal Swab     Status: None   Collection Time: 09/16/21  6:20 PM   Specimen: Nasopharyngeal Swab; Nasopharyngeal(NP) swabs in vial transport medium  Result Value Ref Range Status   SARS Coronavirus 2 by RT PCR NEGATIVE NEGATIVE Final    Comment: (NOTE) SARS-CoV-2 target nucleic acids are NOT DETECTED.  The  SARS-CoV-2 RNA is generally detectable in upper respiratory specimens during the acute phase of infection. The lowest concentration of SARS-CoV-2 viral copies this assay can detect is 138 copies/mL. A negative result does not preclude SARS-Cov-2 infection and should not be used as the sole basis for treatment or other patient management decisions. A negative result may occur with  improper specimen collection/handling, submission of specimen other than nasopharyngeal swab, presence of viral mutation(s) within the areas targeted by this assay, and inadequate number of viral copies(<138 copies/mL). A negative result must be combined with clinical observations, patient history, and epidemiological information. The expected result is Negative.  Fact Sheet for Patients:  EntrepreneurPulse.com.au  Fact Sheet for Healthcare Providers:  IncredibleEmployment.be  This test is no t yet approved or cleared by the [place] FDA and  has been authorized for detection and/or diagnosis of SARS-CoV-2 by FDA under an Emergency Use Authorization (EUA). This EUA will remain  in effect (meaning this test can be used) for the duration of the COVID-19 declaration under Section 564(b)(1) of the Act, 21 U.S.C.section 360bbb-3(b)(1), unless the authorization is terminated  or revoked sooner.       Influenza A by PCR NEGATIVE NEGATIVE Final   Influenza B by PCR NEGATIVE NEGATIVE Final    Comment: (NOTE) The Xpert Xpress SARS-CoV-2/FLU/RSV plus assay is intended as an aid in the diagnosis of influenza from Nasopharyngeal swab specimens and should not be used as a sole basis for treatment. Nasal washings and aspirates are unacceptable for Xpert Xpress SARS-CoV-2/FLU/RSV testing.  Fact Sheet for Patients: EntrepreneurPulse.com.au  Fact Sheet for Healthcare Providers: IncredibleEmployment.be  This test is not yet approved or  cleared by the [place] FDA and has been authorized for detection and/or diagnosis of SARS-CoV-2 by FDA under an Emergency Use Authorization (EUA). This EUA will remain in effect (meaning this test can be used) for the duration of the COVID-19 declaration under Section 564(b)(1) of the Act, 21 U.S.C. section 360bbb-3(b)(1), unless the authorization is terminated or revoked.  Performed at [facility], [address], [place], [place] [address]   Surgical pcr screen     Status: None   Collection Time: 09/23/21  5:04 PM   Specimen: Nasal Mucosa; Nasal Swab  Result Value Ref Range Status  MRSA, PCR NEGATIVE NEGATIVE Final   Staphylococcus aureus NEGATIVE NEGATIVE Final    Comment: (NOTE) The Xpert SA Assay (FDA approved for NASAL specimens in patients 43 years of age and older), is one component of a comprehensive surveillance program. It is not intended to diagnose infection nor to guide or monitor treatment. Performed at [facility], [place] [address], [place], [place] [address]     Labs: CBC: Recent Labs  Lab 09/24/21 0405 09/25/21 0405  WBC 5.9 10.2  HGB 9.9* 9.6*  HCT 30.2* 29.7*  MCV 93.5 94.6  PLT 237 683   Basic Metabolic Panel: Recent Labs  Lab 09/21/21 0410 09/21/21 0913 09/22/21 0406 09/23/21 0445 09/24/21 0405 09/25/21 0405  NA 136  --  138 138 137 137  K 2.7*  --  3.4* 3.6 3.6 4.3  CL 103  --  104 109 107 108  CO2 28  --  27 24 22  21*  GLUCOSE 178*  --  191* 151* 246* 284*  BUN 18  --  15 17 16 18   CREATININE 1.02*  --  1.06* 1.17* 0.98 1.11*  CALCIUM 8.4*  --  8.5* 8.6* 8.7* 8.1*  MG 1.9 2.0  --   --   --   --   PHOS  --  3.1  --   --   --   --    Liver Function Tests: No results for input(s): AST, ALT, ALKPHOS, BILITOT, PROT, ALBUMIN in the last 168 hours. CBG: Recent Labs  Lab 09/26/21 0615 09/26/21 1111 09/26/21 1606 09/26/21 2110 09/27/21 0605  GLUCAP 81 241* 214* 95 71    Discharge time spent: greater than 30  minutes.  Signed: Patrecia Pour, MD [facility] 09/27/2021

## 2021-10-01 ENCOUNTER — Other Ambulatory Visit: Payer: Self-pay

## 2021-10-01 DIAGNOSIS — I6523 Occlusion and stenosis of bilateral carotid arteries: Secondary | ICD-10-CM

## 2021-10-02 ENCOUNTER — Telehealth: Payer: Self-pay | Admitting: *Deleted

## 2021-10-02 NOTE — Telephone Encounter (Signed)
Patient called asking how to cleanse her incision.  Denies warmth,redness or drainage. She has ben gently cleaning with water and patting dry.  I instructed patient a mild soap could be used and continue patting dry.  Patient is to notify office if changes should occur.  Patient voiced understanding of the instructions.

## 2021-10-06 NOTE — Progress Notes (Signed)
[facility] OF   ASSESSMENT / PLAN: 71 y.o. female with status post left TCAR 09/24/2021 for symptomatic left carotid artery stenosis.  She has critical right asymptomatic carotid artery stenosis.  I offered her right TCAR today.  She should continue dual antiplatelet therapy with aspirin and Plavix.  She should continue high intensity statin therapy.  We will plan to do right TCAR 10/23/21.  CHIEF COMPLAINT: follow up carotid stenting.  HISTORY OF PRESENT ILLNESS: [name] is a 71 y.o. female well-known to me for whom I performed a left transcarotid revascularization on 09/24/2021 for symptomatic left carotid artery stenosis.  She tolerated this very well.  She had no focal neurologic deficits postoperatively.  She returns to clinic today.  She is doing very well.  Her right arm weakness continues to improve.  She is recovering well from her surgery.  She has healed her neck incision.  We reviewed her duplex findings today.  We had a long talk about the natural history of asymptomatic carotid artery stenosis.  Past Medical History:  Diagnosis Date   Arthritis    "all over my body" (03/13/2013)   Asthma    Asymptomatic carotid artery stenosis, bilateral 10/08/2018   GERD (gastroesophageal reflux disease)    H/O hiatal hernia    Heart murmur    Hypercholesterolemia 10/08/2018   Hypertension    Hypothyroidism    Myasthenia gravis ([employer])    "in my eyes; diagnsosed > 7 yr ago" (03/13/2013)   Sleep apnea    on CPAP   Thyroid carcinoma ([place])    Type II diabetes mellitus ([place])     Past Surgical History:  Procedure Laterality Date   ABDOMINAL HYSTERECTOMY     ANTERIOR CERVICAL DECOMP/DISCECTOMY FUSION     "I've had severa ORs; always went in from the front" (03/13/2013)   [place]     "several" (03/13/2013)   CARPAL TUNNEL RELEASE Right    CATARACT EXTRACTION W/ INTRAOCULAR LENS  IMPLANT, BILATERAL Bilateral    CHOLECYSTECTOMY      KNEE ARTHROSCOPY Left    SHOULDER ARTHROSCOPY W/ ROTATOR CUFF REPAIR Left    TONSILLECTOMY     TOTAL THYROIDECTOMY     TRANSCAROTID ARTERY REVASCULARIZATION  Left 09/24/2021   Procedure: LEFT TRANSCAROTID ARTERY REVASCULARIZATION;  Surgeon: Cherre Robins, MD;  Location: [facility] OR;  Service: Vascular;  Laterality: Left;   ULTRASOUND GUIDANCE FOR VASCULAR ACCESS Right 09/24/2021   Procedure: ULTRASOUND GUIDANCE FOR VASCULAR ACCESS;  Surgeon: Cherre Robins, MD;  Location: [facility] OR;  Service: Vascular;  Laterality: Right;    Family History  Problem Relation Age of Onset   Coronary artery disease Sister        s/p coronary stenting   Stroke Sister 70   Diabetes Sister    Congestive Heart Failure Mother    Asthma Mother    Diabetes Mother    Congestive Heart Failure Father    Lung cancer Father    Asthma Father    Diabetes Father    Diabetes Brother     Social History   Socioeconomic History   Marital status: Single    Spouse name: Not on file   Number of children: 0   Years of education: college   Highest education level: Not on file  Occupational History   Occupation: retired  Tobacco Use   Smoking status: Never   Smokeless tobacco: Never  Vaping Use   Vaping Use: Never used  Substance and Sexual Activity   Alcohol use: No    Alcohol/week: 0.0 standard drinks   Drug use: No   Sexual activity: Never  Other Topics Concern   Not on file  Social History Narrative   Lives alone   Right handed   Drinks no caffeine   Social Determinants of Health   Financial Resource Strain: Not on file  Food Insecurity: Not on file  Transportation Needs: Not on file  Physical Activity: Not on file  Stress: Not on file  Social Connections: Not on file  Intimate Partner Violence: Not on file    Allergies  Allergen Reactions   Contrast Media [Iodinated Contrast Media] Anaphylaxis    01/10/15 and 09/18/2021 --PT GIVEN 13 HR PRE MEDS FOR CT with contrast TOLERATED IV CONTRAST W/O ANY  REACTION   Fluorescein Shortness Of Breath and Other (See Comments)    (Dye)   Iodine Anaphylaxis    Contrast dye - iodine   Molds & Smuts Shortness Of Breath and Other (See Comments)    Congestion and wheezing, also   Shellfish-Derived Products Anaphylaxis, Shortness Of Breath, Swelling and Other (See Comments)    Welts, also   Azithromycin Nausea Only   Codeine Hives and Nausea Only   Metformin Hcl Er Nausea Only   Tramadol Hcl Nausea Only   Other Rash and Other (See Comments)    Coban causes welts, also    Current Outpatient Medications  Medication Sig Dispense Refill   acetaminophen (TYLENOL) 500 MG tablet Take 500 mg by mouth every 6 (six) hours as needed (pain).     aspirin EC 81 MG EC tablet Take 1 tablet (81 mg total) by mouth daily.     B-D ULTRAFINE III SHORT PEN 31G X 8 MM MISC      Biotin 5000 MCG CAPS Take 5,000 mcg by mouth daily with breakfast.      clopidogrel (PLAVIX) 75 MG tablet Take 1 tablet (75 mg total) by mouth daily. 30 tablet 0   cycloSPORINE (RESTASIS) 0.05 % ophthalmic emulsion Place 1 drop into both eyes 2 (two) times daily. 0.4 mL 0   diclofenac Sodium (VOLTAREN) 1 % GEL Apply 2 g topically 4 (four) times daily. (Patient taking differently: Apply 1 application topically daily as needed (pain).) 2 g 0   diphenhydrAMINE 25 mg in sodium chloride 0.9 % 50 mL Inject 25 mg into the vein See admin instructions. Administer 25 mg intravenously at start of Gamunex infusion, then administer 25 mg during infusion     Elderberry 500 MG CAPS Take 500 mg by mouth daily.     empagliflozin (JARDIANCE) 25 MG TABS tablet Take 25 mg by mouth daily.     esomeprazole (NEXIUM) 40 MG capsule Take 1 capsule (40 mg total) by mouth 2 (two) times daily. 60 capsule 0   Exenatide ER (BYDUREON BCISE) 2 MG/0.85ML AUIJ Inject 2 mg into the skin every Monday.     ezetimibe (ZETIA) 10 MG tablet TAKE 1 TABLET BY MOUTH EVERY DAY (Patient taking differently: Take 10 mg by mouth daily.) 90  tablet 1   Immune Globulin, Human, 40 GM/400ML SOLN Infusions are administered at home by home health nurse - last infusion 09/08/21 and 09/09/21; next infusion due 10/19/21 and 10/20/21     Insulin Disposable Pump (OMNIPOD DASH 5 PACK PODS) MISC Use with Humalog     levothyroxine (SYNTHROID) 150 MCG tablet Take 150 mcg by mouth daily before breakfast.     lidocaine (LIDODERM) 5 %  Place 1 patch onto the skin daily. Remove & Discard patch within 12 hours or as directed by MD (Patient taking differently: Place 1 patch onto the skin daily as needed. Remove & Discard patch within 12 hours or as directed by MD) 30 patch 0   loratadine (CLARITIN) 10 MG tablet Take 1 tablet (10 mg total) by mouth at bedtime. 30 tablet 0   meclizine (ANTIVERT) 25 MG tablet Take 25 mg by mouth 3 (three) times daily as needed for dizziness (or migraine-related nausea).      montelukast (SINGULAIR) 10 MG tablet Take 1 tablet (10 mg total) by mouth every morning. 30 tablet 0   mycophenolate (CELLCEPT) 500 MG tablet Take 1,500 mg by mouth 2 (two) times daily. For myasthenia gravis     olmesartan (BENICAR) 20 MG tablet Take 1 tablet (20 mg total) by mouth daily. 30 tablet 0   predniSONE (DELTASONE) 10 MG tablet Take 10 mg by mouth every morning. For myasthenia gravis     PRESCRIPTION MEDICATION Inhale into the lungs at bedtime. CPAP     PROAIR HFA 108 (90 BASE) MCG/ACT inhaler Inhale 2 puffs into the lungs every 6 (six) hours as needed for wheezing or shortness of breath.      riTUXimab (RITUXAN) 500 MG/50ML injection Inject into the vein every 6 (six) months.     rosuvastatin (CRESTOR) 40 MG tablet Take 1 tablet (40 mg total) by mouth daily. 90 tablet 3   spironolactone-hydrochlorothiazide (ALDACTAZIDE) 25-25 MG tablet Take 1 tablet by mouth every morning. 90 tablet 1   topiramate (TOPAMAX) 50 MG tablet Take 1 tablet (50 mg total) by mouth daily. Start 1 tablet daily x 1 week and then twice daily (Patient taking differently: Take 50  mg by mouth at bedtime.) 180 tablet 0   vitamin B-12 (CYANOCOBALAMIN) 1000 MCG tablet Take 1,000 mcg by mouth daily.     Vitamin D, Ergocalciferol, (DRISDOL) 50000 UNITS CAPS capsule Take 50,000 Units by mouth every Monday.      No current facility-administered medications for this visit.    PHYSICAL EXAM Vitals:   10/07/21 0852  BP: (!) 185/82  Pulse: 85  Resp: 20  Temp: 98.7 F (37.1 C)  SpO2: 99%  Weight: 189 lb (85.7 kg)  Height: 5\' 8"  (1.727 m)    Constitutional: well appearing. no distress. Appears well nourished.  Neurologic: CN intact. Subtle - if any RUE weakness. Reports sensory loss over the right shoulder.  Psychiatric:  Mood and affect symmetric and appropriate. Eyes:  No icterus. No conjunctival pallor. Ears, nose, throat:  mucous membranes moist. Midline trachea.  Cardiac:  rate and rhythm.  Respiratory:  unlabored. Abdominal:  soft, non-tender, non-distended.  Peripheral vascular: neck incision healed Extremity: no edema. no cyanosis. no pallor.  Skin: no gangrene. no ulceration.  Lymphatic: no Stemmer's sign. no palpable lymphadenopathy.  PERTINENT LABORATORY AND RADIOLOGIC DATA  Most recent CBC CBC Latest Ref Rng & Units 09/25/2021 09/24/2021 09/20/2021  WBC 4.0 - 10.5 K/uL 10.2 5.9 6.4  Hemoglobin 12.0 - 15.0 g/dL 9.6(L) 9.9(L) 10.5(L)  Hematocrit 36.0 - 46.0 % 29.7(L) 30.2(L) 31.6(L)  Platelets 150 - 400 K/uL 232 237 223     Most recent CMP CMP Latest Ref Rng & Units 09/25/2021 09/24/2021 09/23/2021  Glucose 70 - 99 mg/dL 284(H) 246(H) 151(H)  BUN 8 - 23 mg/dL 18 16 17   Creatinine 0.44 - 1.00 mg/dL 1.11(H) 0.98 1.17(H)  Sodium 135 - 145 mmol/L 137 137 138  Potassium 3.5 -  5.1 mmol/L 4.3 3.6 3.6  Chloride 98 - 111 mmol/L 108 107 109  CO2 22 - 32 mmol/L 21(L) 22 24  Calcium 8.9 - 10.3 mg/dL 8.1(L) 8.7(L) 8.6(L)  Total Protein 6.5 - 8.1 g/dL - - -  Total Bilirubin 0.3 - 1.2 mg/dL - - -  Alkaline Phos 38 - 126 U/L - - -  AST 15 - 41 U/L - - -  ALT 0 - 44  U/L - - -    Renal function Estimated Creatinine Clearance: 55.6 mL/min (A) (by C-G formula based on SCr of 1.11 mg/dL (H)).  Hgb A1c MFr Bld (%)  Date Value  09/17/2021 9.7 (H)    LDL Chol Calc (NIH)  Date Value Ref Range Status  05/29/2021 96 0 - 99 mg/dL Final   LDL Cholesterol  Date Value Ref Range Status  09/17/2021 120 (H) 0 - 99 mg/dL Final    Comment:           Total Cholesterol/HDL:CHD Risk Coronary Heart Disease Risk Table                     Men   Women  1/2 Average Risk   3.4   3.3  Average Risk       5.0   4.4  2 X Average Risk   9.6   7.1  3 X Average Risk  23.4   11.0        Use the calculated Patient Ratio above and the CHD Risk Table to determine the patient's CHD Risk.        ATP III CLASSIFICATION (LDL):  <100     mg/dL   Optimal  100-129  mg/dL   Near or Above                    Optimal  130-159  mg/dL   Borderline  160-189  mg/dL   High  >190     mg/dL   Very High Performed at [facility] [address] [place], [place] [address]    LDL Direct  Date Value Ref Range Status  05/29/2021 96 0 - 99 mg/dL Final     Vascular Imaging: Carotid duplex 10/07/2021 personally reviewed in detail.  Technical result from stenting.  No residual stenosis on the left side.  The right side by velocity criteria has improved, as expected after contralateral stenting.  Previous CT angiogram confirms 90% stenosis.  Yevonne Aline. Stanford Breed, MD [facility] Phone Number: [phone] 10/07/2021 9:54 AM  Total time spent on preparing this encounter including chart review, data review, collecting history, examining the patient, coordinating care for this established patient, 30 minutes.  Portions of this report may have been transcribed using voice recognition software.  Every effort has been made to ensure accuracy; however, inadvertent computerized transcription errors may still be present.

## 2021-10-06 NOTE — H&P (View-Only) (Signed)
[facility] [place]  ASSESSMENT / PLAN: 71 y.o. female with status post left TCAR 09/24/2021 for symptomatic left carotid artery stenosis.  She has critical right asymptomatic carotid artery stenosis.  I offered her right TCAR today.  She should continue dual antiplatelet therapy with aspirin and Plavix.  She should continue high intensity statin therapy.  We will plan to do right TCAR 10/23/21.  CHIEF COMPLAINT: follow up carotid stenting.  HISTORY OF PRESENT ILLNESS: [name] is a 72 y.o. female well-known to me for whom I performed a left transcarotid revascularization on 09/24/2021 for symptomatic left carotid artery stenosis.  She tolerated this very well.  She had no focal neurologic deficits postoperatively.  She returns to clinic today.  She is doing very well.  Her right arm weakness continues to improve.  She is recovering well from her surgery.  She has healed her neck incision.  We reviewed her duplex findings today.  We had a long talk about the natural history of asymptomatic carotid artery stenosis.  Past Medical History:  Diagnosis Date   Arthritis    "all over my body" (03/13/2013)   Asthma    Asymptomatic carotid artery stenosis, bilateral 10/08/2018   GERD (gastroesophageal reflux disease)    H/O hiatal hernia    Heart murmur    Hypercholesterolemia 10/08/2018   Hypertension    Hypothyroidism    Myasthenia gravis ([place])    "in my eyes; diagnsosed > 7 yr ago" (03/13/2013)   Sleep apnea    on CPAP   Thyroid carcinoma ([place])    Type II diabetes mellitus ([place])     Past Surgical History:  Procedure Laterality Date   ABDOMINAL HYSTERECTOMY     ANTERIOR CERVICAL DECOMP/DISCECTOMY FUSION     "I've had severa ORs; always went in from the front" (03/13/2013)   [place]     "several" (03/13/2013)   CARPAL TUNNEL RELEASE Right    CATARACT EXTRACTION W/ INTRAOCULAR LENS  IMPLANT, BILATERAL Bilateral    CHOLECYSTECTOMY      KNEE ARTHROSCOPY Left    SHOULDER ARTHROSCOPY W/ ROTATOR CUFF REPAIR Left    TONSILLECTOMY     TOTAL THYROIDECTOMY     TRANSCAROTID ARTERY REVASCULARIZATION  Left 09/24/2021   Procedure: LEFT TRANSCAROTID ARTERY REVASCULARIZATION;  Surgeon: Cherre Robins, MD;  Location: [facility] OR;  Service: Vascular;  Laterality: Left;   ULTRASOUND GUIDANCE FOR VASCULAR ACCESS Right 09/24/2021   Procedure: ULTRASOUND GUIDANCE FOR VASCULAR ACCESS;  Surgeon: Cherre Robins, MD;  Location: [facility] OR;  Service: Vascular;  Laterality: Right;    Family History  Problem Relation Age of Onset   Coronary artery disease Sister        s/p coronary stenting   Stroke Sister 93   Diabetes Sister    Congestive Heart Failure Mother    Asthma Mother    Diabetes Mother    Congestive Heart Failure Father    Lung cancer Father    Asthma Father    Diabetes Father    Diabetes Brother     Social History   Socioeconomic History   Marital status: Single    Spouse name: Not on file   Number of children: 0   Years of education: college   Highest education level: Not on file  Occupational History   Occupation: retired  Tobacco Use   Smoking status: Never   Smokeless tobacco: Never  Vaping Use   Vaping Use: Never used  Substance and Sexual Activity   Alcohol use: No    Alcohol/week: 0.0 standard drinks   Drug use: No   Sexual activity: Never  Other Topics Concern   Not on file  Social History Narrative   Lives alone   Right handed   Drinks no caffeine   Social Determinants of Health   Financial Resource Strain: Not on file  Food Insecurity: Not on file  Transportation Needs: Not on file  Physical Activity: Not on file  Stress: Not on file  Social Connections: Not on file  Intimate Partner Violence: Not on file    Allergies  Allergen Reactions   Contrast Media [Iodinated Contrast Media] Anaphylaxis    01/10/15 and 09/18/2021 --PT GIVEN 13 HR PRE MEDS FOR CT with contrast TOLERATED IV CONTRAST W/O ANY  REACTION   Fluorescein Shortness Of Breath and Other (See Comments)    (Dye)   Iodine Anaphylaxis    Contrast dye - iodine   Molds & Smuts Shortness Of Breath and Other (See Comments)    Congestion and wheezing, also   Shellfish-Derived Products Anaphylaxis, Shortness Of Breath, Swelling and Other (See Comments)    Welts, also   Azithromycin Nausea Only   Codeine Hives and Nausea Only   Metformin Hcl Er Nausea Only   Tramadol Hcl Nausea Only   Other Rash and Other (See Comments)    Coban causes welts, also    Current Outpatient Medications  Medication Sig Dispense Refill   acetaminophen (TYLENOL) 500 MG tablet Take 500 mg by mouth every 6 (six) hours as needed (pain).     aspirin EC 81 MG EC tablet Take 1 tablet (81 mg total) by mouth daily.     B-D ULTRAFINE III SHORT PEN 31G X 8 MM MISC      Biotin 5000 MCG CAPS Take 5,000 mcg by mouth daily with breakfast.      clopidogrel (PLAVIX) 75 MG tablet Take 1 tablet (75 mg total) by mouth daily. 30 tablet 0   cycloSPORINE (RESTASIS) 0.05 % ophthalmic emulsion Place 1 drop into both eyes 2 (two) times daily. 0.4 mL 0   diclofenac Sodium (VOLTAREN) 1 % GEL Apply 2 g topically 4 (four) times daily. (Patient taking differently: Apply 1 application topically daily as needed (pain).) 2 g 0   diphenhydrAMINE 25 mg in sodium chloride 0.9 % 50 mL Inject 25 mg into the vein See admin instructions. Administer 25 mg intravenously at start of Gamunex infusion, then administer 25 mg during infusion     Elderberry 500 MG CAPS Take 500 mg by mouth daily.     empagliflozin (JARDIANCE) 25 MG TABS tablet Take 25 mg by mouth daily.     esomeprazole (NEXIUM) 40 MG capsule Take 1 capsule (40 mg total) by mouth 2 (two) times daily. 60 capsule 0   Exenatide ER (BYDUREON BCISE) 2 MG/0.85ML AUIJ Inject 2 mg into the skin every Monday.     ezetimibe (ZETIA) 10 MG tablet TAKE 1 TABLET BY MOUTH EVERY DAY (Patient taking differently: Take 10 mg by mouth daily.) 90  tablet 1   Immune Globulin, Human, 40 GM/400ML SOLN Infusions are administered at home by home health nurse - last infusion 09/08/21 and 09/09/21; next infusion due 10/19/21 and 10/20/21     Insulin Disposable Pump (OMNIPOD DASH 5 PACK PODS) MISC Use with Humalog     levothyroxine (SYNTHROID) 150 MCG tablet Take 150 mcg by mouth daily before breakfast.     lidocaine (LIDODERM) 5 %  Place 1 patch onto the skin daily. Remove & Discard patch within 12 hours or as directed by MD (Patient taking differently: Place 1 patch onto the skin daily as needed. Remove & Discard patch within 12 hours or as directed by MD) 30 patch 0   loratadine (CLARITIN) 10 MG tablet Take 1 tablet (10 mg total) by mouth at bedtime. 30 tablet 0   meclizine (ANTIVERT) 25 MG tablet Take 25 mg by mouth 3 (three) times daily as needed for dizziness (or migraine-related nausea).      montelukast (SINGULAIR) 10 MG tablet Take 1 tablet (10 mg total) by mouth every morning. 30 tablet 0   mycophenolate (CELLCEPT) 500 MG tablet Take 1,500 mg by mouth 2 (two) times daily. For myasthenia gravis     olmesartan (BENICAR) 20 MG tablet Take 1 tablet (20 mg total) by mouth daily. 30 tablet 0   predniSONE (DELTASONE) 10 MG tablet Take 10 mg by mouth every morning. For myasthenia gravis     PRESCRIPTION MEDICATION Inhale into the lungs at bedtime. CPAP     PROAIR HFA 108 (90 BASE) MCG/ACT inhaler Inhale 2 puffs into the lungs every 6 (six) hours as needed for wheezing or shortness of breath.      riTUXimab (RITUXAN) 500 MG/50ML injection Inject into the vein every 6 (six) months.     rosuvastatin (CRESTOR) 40 MG tablet Take 1 tablet (40 mg total) by mouth daily. 90 tablet 3   spironolactone-hydrochlorothiazide (ALDACTAZIDE) 25-25 MG tablet Take 1 tablet by mouth every morning. 90 tablet 1   topiramate (TOPAMAX) 50 MG tablet Take 1 tablet (50 mg total) by mouth daily. Start 1 tablet daily x 1 week and then twice daily (Patient taking differently: Take 50  mg by mouth at bedtime.) 180 tablet 0   vitamin B-12 (CYANOCOBALAMIN) 1000 MCG tablet Take 1,000 mcg by mouth daily.     Vitamin D, Ergocalciferol, (DRISDOL) 50000 UNITS CAPS capsule Take 50,000 Units by mouth every Monday.      No current facility-administered medications for this visit.    PHYSICAL EXAM Vitals:   10/07/21 0852  BP: (!) 185/82  Pulse: 85  Resp: 20  Temp: 98.7 F (37.1 C)  SpO2: 99%  Weight: 189 lb (85.7 kg)  Height: 5\' 8"  (1.727 m)    Constitutional: well appearing. no distress. Appears well nourished.  Neurologic: CN intact. Subtle - if any RUE weakness. Reports sensory loss over the right shoulder.  Psychiatric:  Mood and affect symmetric and appropriate. Eyes:  No icterus. No conjunctival pallor. Ears, nose, throat:  mucous membranes moist. Midline trachea.  Cardiac:  rate and rhythm.  Respiratory:  unlabored. Abdominal:  soft, non-tender, non-distended.  Peripheral vascular: neck incision healed Extremity: no edema. no cyanosis. no pallor.  Skin: no gangrene. no ulceration.  Lymphatic: no Stemmer's sign. no palpable lymphadenopathy.  PERTINENT LABORATORY AND RADIOLOGIC DATA  Most recent CBC CBC Latest Ref Rng & Units 09/25/2021 09/24/2021 09/20/2021  WBC 4.0 - 10.5 K/uL 10.2 5.9 6.4  Hemoglobin 12.0 - 15.0 g/dL 9.6(L) 9.9(L) 10.5(L)  Hematocrit 36.0 - 46.0 % 29.7(L) 30.2(L) 31.6(L)  Platelets 150 - 400 K/uL 232 237 223     Most recent CMP CMP Latest Ref Rng & Units 09/25/2021 09/24/2021 09/23/2021  Glucose 70 - 99 mg/dL 284(H) 246(H) 151(H)  BUN 8 - 23 mg/dL 18 16 17   Creatinine 0.44 - 1.00 mg/dL 1.11(H) 0.98 1.17(H)  Sodium 135 - 145 mmol/L 137 137 138  Potassium 3.5 -  5.1 mmol/L 4.3 3.6 3.6  Chloride 98 - 111 mmol/L 108 107 109  CO2 22 - 32 mmol/L 21(L) 22 24  Calcium 8.9 - 10.3 mg/dL 8.1(L) 8.7(L) 8.6(L)  Total Protein 6.5 - 8.1 g/dL - - -  Total Bilirubin 0.3 - 1.2 mg/dL - - -  Alkaline Phos 38 - 126 U/L - - -  AST 15 - 41 U/L - - -  ALT 0 - 44  U/L - - -    Renal function Estimated Creatinine Clearance: 55.6 mL/min (A) (by C-G formula based on SCr of 1.11 mg/dL (H)).  Hgb A1c MFr Bld (%)  Date Value  09/17/2021 9.7 (H)    LDL Chol Calc (NIH)  Date Value Ref Range Status  05/29/2021 96 0 - 99 mg/dL Final   LDL Cholesterol  Date Value Ref Range Status  09/17/2021 120 (H) 0 - 99 mg/dL Final    Comment:           Total Cholesterol/HDL:CHD Risk Coronary Heart Disease Risk Table                     Men   Women  1/2 Average Risk   3.4   3.3  Average Risk       5.0   4.4  2 X Average Risk   9.6   7.1  3 X Average Risk  23.4   11.0        Use the calculated Patient Ratio above and the CHD Risk Table to determine the patient's CHD Risk.        ATP III CLASSIFICATION (LDL):  <100     mg/dL   Optimal  100-129  mg/dL   Near or Above                    Optimal  130-159  mg/dL   Borderline  160-189  mg/dL   High  >190     mg/dL   Very High Performed at [facility] [address] [place], [place] [address]    LDL Direct  Date Value Ref Range Status  05/29/2021 96 0 - 99 mg/dL Final     Vascular Imaging: Carotid duplex 10/07/2021 personally reviewed in detail.  Technical result from stenting.  No residual stenosis on the left side.  The right side by velocity criteria has improved, as expected after contralateral stenting.  Previous CT angiogram confirms 90% stenosis.  Yevonne Aline. Stanford Breed, MD [facility] Phone Number: [phone] 10/07/2021 9:54 AM  Total time spent on preparing this encounter including chart review, data review, collecting history, examining the patient, coordinating care for this established patient, 30 minutes.  Portions of this report may have been transcribed using voice recognition software.  Every effort has been made to ensure accuracy; however, inadvertent computerized transcription errors may still be present.

## 2021-10-07 ENCOUNTER — Other Ambulatory Visit: Payer: Self-pay

## 2021-10-07 ENCOUNTER — Ambulatory Visit (INDEPENDENT_AMBULATORY_CARE_PROVIDER_SITE_OTHER): Payer: Medicare Other | Admitting: Vascular Surgery

## 2021-10-07 ENCOUNTER — Encounter (HOSPITAL_COMMUNITY): Payer: Medicare Other

## 2021-10-07 ENCOUNTER — Encounter: Payer: Self-pay | Admitting: Vascular Surgery

## 2021-10-07 ENCOUNTER — Ambulatory Visit (HOSPITAL_COMMUNITY)
Admission: RE | Admit: 2021-10-07 | Discharge: 2021-10-07 | Disposition: A | Discharge status: Home / Self Care | Payer: Medicare Other | Source: Ambulatory Visit | Attending: Vascular Surgery | Admitting: Vascular Surgery

## 2021-10-07 VITALS — BP 185/82 | HR 85 | Temp 98.7°F | Resp 20 | Ht 68.0 in | Wt 189.0 lb

## 2021-10-07 DIAGNOSIS — I6523 Occlusion and stenosis of bilateral carotid arteries: Secondary | ICD-10-CM

## 2021-10-14 DIAGNOSIS — R569 Unspecified convulsions: Secondary | ICD-10-CM | POA: Diagnosis not present

## 2021-10-14 NOTE — Progress Notes (Signed)
Surgical Instructions    Your procedure is scheduled on Thursday, March 2nd, 2023.   Report to [facility] Main Entrance "A" at 05:30 A.M., then check in with the Admitting office.  Call this number if you have problems the morning of surgery:  [phone]   If you have any questions prior to your surgery date call [phone]: Open Monday-Friday 8am-4pm    Remember:  Do not eat or drink after midnight the night before your surgery    Take these medicines the morning of surgery with A SIP OF WATER:   esomeprazole (NEXIUM) ezetimibe (ZETIA) levothyroxine (SYNTHROID)  montelukast (SINGULAIR)   predniSONE (DELTASONE) rosuvastatin (CRESTOR)  Eye drops  If needed:  acetaminophen (TYLENOL) meclizine (ANTIVERT)  PROAIR HFA - please, bring the inhaler with you the day of surgery  Ask your doctor if you must hold mycophenolate (CELLCEPT) prior your surgery  Follow your surgeon's instructions on when to stop Aspirin and Plavix.  If no instructions were given by your surgeon then you will need to call the office to get those instructions.     As of today, STOP taking any Aspirin (unless otherwise instructed by your surgeon) Aleve, Naproxen, Ibuprofen, Motrin, Advil, Goody's, BC's, all herbal medications, fish oil, and all vitamins.   WHAT DO I DO ABOUT MY DIABETES MEDICATION?   Do not take empagliflozin (JARDIANCE) the day before surgery and the day of surgery.   HOW TO MANAGE YOUR DIABETES BEFORE AND AFTER SURGERY  Why is it important to control my blood sugar before and after surgery? Improving blood sugar levels before and after surgery helps healing and can limit problems. A way of improving blood sugar control is eating a healthy diet by:  Eating less sugar and carbohydrates  Increasing activity/exercise  Talking with your doctor about reaching your blood sugar goals High blood sugars (greater than 180 mg/dL) can raise your risk of infections and slow your recovery, so  you will need to focus on controlling your diabetes during the weeks before surgery. Make sure that the doctor who takes care of your diabetes knows about your planned surgery including the date and location.  How do I manage my blood sugar before surgery? Check your blood sugar at least 4 times a day, starting 2 days before surgery, to make sure that the level is not too high or low.  Check your blood sugar the morning of your surgery when you wake up and every 2 hours until you get to the Short Stay unit.  If your blood sugar is less than 70 mg/dL, you will need to treat for low blood sugar: Do not take insulin. Treat a low blood sugar (less than 70 mg/dL) with  cup of clear juice (cranberry or apple), 4 glucose tablets, OR glucose gel. Recheck blood sugar in 15 minutes after treatment (to make sure it is greater than 70 mg/dL). If your blood sugar is not greater than 70 mg/dL on recheck, call [phone] for further instructions. Report your blood sugar to the short stay nurse when you get to Short Stay.  If you are admitted to the hospital after surgery: Your blood sugar will be checked by the staff and you will probably be given insulin after surgery (instead of oral diabetes medicines) to make sure you have good blood sugar levels. The goal for blood sugar control after surgery is 80-180 mg/dL.      The day of surgery:          Do not  wear jewelry or makeup Do not wear lotions, powders, perfumes, or deodorant. Do not shave 48 hours prior to surgery.   Do not bring valuables to the hospital. Do not wear nail polish, gel polish, artificial nails, or any other type of covering on natural nails (fingers and toes) If you have artificial nails or gel coating that need to be removed by a nail salon, please have this removed prior to surgery. Artificial nails or gel coating may interfere with anesthesia's ability to adequately monitor your vital signs.   [facility] is not responsible  for any belongings or valuables. .   Do NOT Smoke (Tobacco/Vaping)  24 hours prior to your procedure  If you use a CPAP at night, you may bring your mask for your overnight stay.   Contacts, glasses, hearing aids, dentures or partials may not be worn into surgery, please bring cases for these belongings   For patients admitted to the hospital, discharge time will be determined by your treatment team.   Patients discharged the day of surgery will not be allowed to drive home, and someone needs to stay with them for 24 hours.  NO VISITORS WILL BE ALLOWED IN PRE-OP WHERE PATIENTS ARE PREPPED FOR SURGERY.  ONLY 1 SUPPORT PERSON MAY BE PRESENT IN THE WAITING ROOM WHILE YOU ARE IN SURGERY.  IF YOU ARE TO BE ADMITTED, ONCE YOU ARE IN YOUR ROOM YOU WILL BE ALLOWED TWO (2) VISITORS. 1 (ONE) VISITOR MAY STAY OVERNIGHT BUT MUST ARRIVE TO THE ROOM BY 8pm.  Minor children may have two parents present. Special consideration for safety and communication needs will be reviewed on a case by case basis.  Special instructions:    Oral Hygiene is also important to reduce your risk of infection.  Remember - BRUSH YOUR TEETH THE MORNING OF SURGERY WITH YOUR REGULAR TOOTHPASTE   [facility]- Preparing For Surgery  Before surgery, you can play an important role. Because skin is not sterile, your skin needs to be as free of germs as possible. You can reduce the number of germs on your skin by washing with CHG (chlorahexidine gluconate) Soap before surgery.  CHG is an antiseptic cleaner which kills germs and bonds with the skin to continue killing germs even after washing.     Please do not use if you have an allergy to CHG or antibacterial soaps. If your skin becomes reddened/irritated stop using the CHG.  Do not shave (including legs and underarms) for at least 48 hours prior to first CHG shower. It is OK to shave your face.  Please follow these instructions carefully.     Shower the NIGHT BEFORE SURGERY and  the MORNING OF SURGERY with CHG Soap.   If you chose to wash your hair, wash your hair first as usual with your normal shampoo. After you shampoo, rinse your hair and body thoroughly to remove the shampoo.  Then [employer] and genitals (private parts) with your normal soap and rinse thoroughly to remove soap.  After that Use CHG Soap as you would any other liquid soap. You can apply CHG directly to the skin and wash gently with a scrungie or a clean washcloth.   Apply the CHG Soap to your body ONLY FROM THE NECK DOWN.  Do not use on open wounds or open sores. Avoid contact with your eyes, ears, mouth and genitals (private parts). Wash Face and genitals (private parts)  with your normal soap.   Wash thoroughly, paying special attention to  the area where your surgery will be performed.  Thoroughly rinse your body with warm water from the neck down.  DO NOT shower/wash with your normal soap after using and rinsing off the CHG Soap.  Pat yourself dry with a CLEAN TOWEL.  Wear CLEAN PAJAMAS to bed the night before surgery  Place CLEAN SHEETS on your bed the night before your surgery  DO NOT SLEEP WITH PETS.   Day of Surgery:  Take a shower with CHG soap. Wear Clean/Comfortable clothing the morning of surgery Do not apply any deodorants/lotions.   Remember to brush your teeth WITH YOUR REGULAR TOOTHPASTE.    COVID testing  If you are going to stay overnight or be admitted after your procedure/surgery and require a pre-op COVID test, please follow these instructions after your COVID test   You are not required to quarantine however you are required to wear a well-fitting mask when you are out and around people not in your household.  If your mask becomes wet or soiled, replace with a new one.  Wash your hands often with soap and water for 20 seconds or clean your hands with an alcohol-based hand sanitizer that contains at least 60% alcohol.  Do not share personal items.  Notify your  provider: if you are in close contact with someone who has COVID  or if you develop a fever of 100.4 or greater, sneezing, cough, sore throat, shortness of breath or body aches.    Please read over the following fact sheets that you were given.

## 2021-10-15 ENCOUNTER — Encounter (HOSPITAL_COMMUNITY): Payer: Self-pay

## 2021-10-15 ENCOUNTER — Other Ambulatory Visit: Payer: Self-pay

## 2021-10-15 ENCOUNTER — Encounter (HOSPITAL_COMMUNITY)
Admission: RE | Admit: 2021-10-15 | Discharge: 2021-10-15 | Disposition: A | Discharge status: Home / Self Care | Payer: Medicare Other | Source: Ambulatory Visit | Attending: Vascular Surgery | Admitting: Vascular Surgery

## 2021-10-15 DIAGNOSIS — E119 Type 2 diabetes mellitus without complications: Secondary | ICD-10-CM | POA: Diagnosis not present

## 2021-10-15 DIAGNOSIS — I10 Essential (primary) hypertension: Secondary | ICD-10-CM | POA: Insufficient documentation

## 2021-10-15 DIAGNOSIS — Z6833 Body mass index (BMI) 33.0-33.9, adult: Secondary | ICD-10-CM | POA: Insufficient documentation

## 2021-10-15 DIAGNOSIS — Z01812 Encounter for preprocedural laboratory examination: Secondary | ICD-10-CM | POA: Insufficient documentation

## 2021-10-15 DIAGNOSIS — E669 Obesity, unspecified: Secondary | ICD-10-CM | POA: Insufficient documentation

## 2021-10-15 DIAGNOSIS — I6523 Occlusion and stenosis of bilateral carotid arteries: Secondary | ICD-10-CM | POA: Insufficient documentation

## 2021-10-15 DIAGNOSIS — J45909 Unspecified asthma, uncomplicated: Secondary | ICD-10-CM | POA: Diagnosis not present

## 2021-10-15 DIAGNOSIS — K219 Gastro-esophageal reflux disease without esophagitis: Secondary | ICD-10-CM | POA: Diagnosis not present

## 2021-10-15 DIAGNOSIS — G7000 Myasthenia gravis without (acute) exacerbation: Secondary | ICD-10-CM | POA: Diagnosis not present

## 2021-10-15 DIAGNOSIS — Z01818 Encounter for other preprocedural examination: Secondary | ICD-10-CM

## 2021-10-15 DIAGNOSIS — E7800 Pure hypercholesterolemia, unspecified: Secondary | ICD-10-CM | POA: Diagnosis not present

## 2021-10-15 DIAGNOSIS — Z8673 Personal history of transient ischemic attack (TIA), and cerebral infarction without residual deficits: Secondary | ICD-10-CM | POA: Diagnosis not present

## 2021-10-15 HISTORY — DX: Headache, unspecified: R51.9

## 2021-10-15 LAB — CBC
HCT: 31.8 % — ABNORMAL LOW (ref 36.0–46.0)
Hemoglobin: 10.2 g/dL — ABNORMAL LOW (ref 12.0–15.0)
MCH: 31.2 pg (ref 26.0–34.0)
MCHC: 32.1 g/dL (ref 30.0–36.0)
MCV: 97.2 fL (ref 80.0–100.0)
Platelets: 257 10*3/uL (ref 150–400)
RBC: 3.27 MIL/uL — ABNORMAL LOW (ref 3.87–5.11)
RDW: 14.4 % (ref 11.5–15.5)
WBC: 6.4 10*3/uL (ref 4.0–10.5)
nRBC: 0 % (ref 0.0–0.2)

## 2021-10-15 LAB — COMPREHENSIVE METABOLIC PANEL
ALT: 26 U/L (ref 0–44)
AST: 22 U/L (ref 15–41)
Albumin: 3.5 g/dL (ref 3.5–5.0)
Alkaline Phosphatase: 130 U/L — ABNORMAL HIGH (ref 38–126)
Anion gap: 10 (ref 5–15)
BUN: 17 mg/dL (ref 8–23)
CO2: 23 mmol/L (ref 22–32)
Calcium: 9.4 mg/dL (ref 8.9–10.3)
Chloride: 110 mmol/L (ref 98–111)
Creatinine, Ser: 0.89 mg/dL (ref 0.44–1.00)
GFR, Estimated: >60 mL/min (ref >60)
Glucose, Bld: 169 mg/dL — ABNORMAL HIGH (ref 70–99)
Potassium: 3.3 mmol/L — ABNORMAL LOW (ref 3.5–5.1)
Sodium: 143 mmol/L (ref 135–145)
Total Bilirubin: 0.3 mg/dL (ref 0.3–1.2)
Total Protein: 6.8 g/dL (ref 6.5–8.1)

## 2021-10-15 LAB — URINALYSIS, ROUTINE W REFLEX MICROSCOPIC
Bilirubin Urine: NEGATIVE
Glucose, UA: NEGATIVE mg/dL
Ketones, ur: NEGATIVE mg/dL
Nitrite: NEGATIVE
Protein, ur: 30 mg/dL — AB
Specific Gravity, Urine: 1.015 (ref 1.005–1.030)
pH: 5 (ref 5.0–8.0)

## 2021-10-15 LAB — GLUCOSE, CAPILLARY: Glucose-Capillary: 160 mg/dL — ABNORMAL HIGH (ref 70–99)

## 2021-10-15 LAB — SURGICAL PCR SCREEN
MRSA, PCR: NEGATIVE
Staphylococcus aureus: NEGATIVE

## 2021-10-15 LAB — APTT: aPTT: 28 seconds (ref 24–36)

## 2021-10-15 LAB — TYPE AND SCREEN
ABO/RH(D): O POS
Antibody Screen: NEGATIVE

## 2021-10-15 LAB — PROTIME-INR
INR: 1 (ref 0.8–1.2)
Prothrombin Time: 12.6 seconds (ref 11.4–15.2)

## 2021-10-15 NOTE — Progress Notes (Signed)
PCP - Dr. [name] Cardiologist - Dr. Adrian Prows  PPM/ICD - denies   Chest x-ray - 09/16/21 EKG - 09/18/21 Stress Test - 08/09/15 ECHO - 09/17/21 Cardiac Cath - 09/24/21  Sleep Study - OSA+  CPAP - yes, unsure of pressure settings  DM- Type 2 Fasting Blood Sugar - 90-100 Checks Blood Sugar several times a day (pt has continuous sensor and insulin pump) Spoke with diabetes coordinator. Ok for pt to leave insulin pump and sensor since surgery is less than 2 hours.  ASA/Blood Thinner Instructions: pt instructed to continue ASA and Plavix thru DOS. Hold morning of surgery   ERAS Protcol - no, NPO   COVID TEST- Pt scheduled for testing on 10/22/21 0915   Anesthesia review: yes, cardiac hx  Patient denies shortness of breath, fever, cough and chest pain at PAT appointment   All instructions explained to the patient, with a verbal understanding of the material. Patient agrees to go over the instructions while at home for a better understanding. Patient also instructed to wear a mask in public after being tested for COVID-19. The opportunity to ask questions was provided.

## 2021-10-16 NOTE — Anesthesia Preprocedure Evaluation (Addendum)
Anesthesia Evaluation  Patient identified by MRN, date of birth, ID band Patient awake    Reviewed: Allergy & Precautions, NPO status , Patient's Chart, lab work & pertinent test results  Airway Mallampati: II  TM Distance: >3 FB Neck ROM: Full    Dental  (+) Dental Advisory Given   Pulmonary asthma , sleep apnea and Continuous Positive Airway Pressure Ventilation ,    breath sounds clear to auscultation       Cardiovascular hypertension, Pt. on medications  Rhythm:Regular Rate:Normal     Neuro/Psych  Headaches, TIA Neuromuscular disease CVA    GI/Hepatic Neg liver ROS, hiatal hernia, GERD  ,  Endo/Other  diabetes, Type 2, Insulin DependentHypothyroidism   Renal/GU negative Renal ROS     Musculoskeletal  (+) Arthritis ,   Abdominal   Peds  Hematology  (+) Blood dyscrasia, anemia ,   Anesthesia Other Findings   Reproductive/Obstetrics                            Anesthesia Physical Anesthesia Plan  ASA: 3  Anesthesia Plan: General   Post-op Pain Management: Tylenol PO (pre-op)* and Minimal or no pain anticipated   Induction: Intravenous  PONV Risk Score and Plan: 3 and Dexamethasone, Ondansetron and Treatment may vary due to age or medical condition  Airway Management Planned: Oral ETT  Additional Equipment: Arterial line  Intra-op Plan:   Post-operative Plan: Extubation in OR  Informed Consent: I have reviewed the patients History and Physical, chart, labs and discussed the procedure including the risks, benefits and alternatives for the proposed anesthesia with the patient or authorized representative who has indicated his/her understanding and acceptance.     Dental advisory given  Plan Discussed with:   Anesthesia Plan Comments:        Anesthesia Quick Evaluation

## 2021-10-16 NOTE — Progress Notes (Signed)
Anesthesia Chart Review:  Case: [id number] Date/Time: 10/23/21 0715   Procedure: Right Transcarotid Artery Revascularization (Right)   Anesthesia type: General   Pre-op diagnosis: RIGHT CAROTID STENOSIS   Location: [facility] OR ROOM 16 / [facility]   Surgeons: Cherre Robins, MD       DISCUSSION: Patient is a 71 year old female scheduled for the above procedure. Chart reviewed, and patient contacted via phone.  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 BMI is consistent with obesity.  [facility] admission 09/16/21-09/27/21 for right facial numbness and mild right arm weakness. Head CT and MRI negative for acute infarct. CTA showed 80-90% RICA stenosis, 03% LICA stenosis. Neurology and vascular surgery consulted. They felt she likely had TIA from symptomatic LICA disease. Neurologist Dr. Erlinda Hong, "Discussed with Drs. Truddie Hidden and [place], will plan for left carotid revascularization first and then elective right carotid revascularization." S/p left TCAR on 09/24/21. She reported she still has some right facial numbness, but did get some strength back in her RLE. She had some dysphagia right after left TCAR which as improved.   Last neurology visit with Dr. Nanine Means on 09/04/21. See below for summary or full note in [facility].  She contacted his office to notify him about surgery plans and inquire about timing of next IVIG infusions. Cellcept continues to be on hold. She plans to contact Dr. Robbie Lis office on 10/17/21 to follow-up on CellCept  and IVIG instructions.   Her endocrinologist is Dr. Chalmers Cater, next visit 10/29/21.  A1c 9.7% on 09/17/21. She has an insulin Omnipod pump and continuous glucose monitoring sensor. She says she changes her Omnipod every 2 days, and she would be due to change it on the night before surgery. She had to take off her Omnipod when she was recently hospitalized and is concerned about potentially having to waste one if it needs to be removed before or just after surgery. She says she is a type 2 diabetic and has a SSI Humalog that she can use at home alone or in conjunction with her Omnipod. Her case is scheduled for approximately two hours, so potentially she could continue the Omnipod (if estimated surgery time is < 2 hours), but I cannot guarantee her that it would not require removal at some point during her hospitalization. Since she is a type 2 diabetic who has a SSI regimen, so I also think it is reasonable for her to follow her SSI regimen if she chooses not to replace her Omnipod pump when it is changed the night before surgery. Currently arrival time for surgery is 5:30 AM. She was treated with insulin, either IV or subcutaneously during her recent hospitalization and was reportedly told to resume her Omnipod pump once she got home which is what she would like to do. I also encouraged her to reach out to Dr. Chalmers Cater if other questions about perioperative DM regimen. At PAT, she reported fasting CBGs ~ 90-100.  Patient reported that she has reached out to [facility] regarding clarification of her ASA and Plavix. Discussed that generally both are continued for TCAR procedures, but since she has already contacted [facility] I will  let them clarify with her.   Preoperative COVID-19 testing is scheduled for 10/22/2021. Anesthesia team to evaluate on the day of surgery.    VS: Pulse (P) 91    Temp (P) 36.9 C (Oral)    Resp (P) 18    Ht (P) 5\' 3"  (1.6 m)    Wt (P) 86.5 kg    SpO2 (P) 99%    BMI (P) 33.76 kg/m  BP Readings from Last  3 Encounters:  10/07/21 (!) 185/82  09/27/21 (!) 126/56  08/07/21 (!) 147/59      PROVIDERS: [name], DO is PCP  - Adrian Prows, MD is cardiologist. Last visit 08/07/21.  Jacelyn Pi, MD is endocrinologist - Queen Slough, MD is [place] Neurologist. Last visit 09/04/21 for follow-up "seronegative nonthymomatous predominantly ocular myasthenia. She continues to have significant ocular manifestations of myasthenia with ptosis and diplopia, but her MG is improved overall compared to her pre-treatment state on MMF 1500 mg BID, prednisone 10 mg daily, IVIg 1 g/kg every 6 weeks." Cellcept held for 10 days then due to diarrhea, then re-evaluate whether to resume at current or lower dose. Continue IVIG Q 6 weeks and prednisone 10 mg daily. She is also on Rituxan every six months. Advised "on avoiding ciprofloxacin - okay to use penicillins, cephalosporins instead". Six month follow-up planned.  Carmelina Dane, MD is [facility] ophthalmologist. 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  Her vision and symptoms are stable since her last infusion of RTX. There is subtle recovery of EZ in both eyes..Continue meds..."   LABS: PAT labs noted.  H/H 10.2/31.8, up from 9.6/29.7 on 09/25/21. Prior to TCAR her HGB was 12.4 on 09/16/21, so some of the drop is likely from surgical EBL which I discussed with her, but advised follow-up with her PCP after surgery to re-evaluate.. A1c 9.7% on 09/17/21.  (all labs ordered are listed, but only abnormal results are displayed)  Labs Reviewed  URINALYSIS, ROUTINE W REFLEX MICROSCOPIC - Abnormal; Notable for the following components:      Result Value   Hgb urine dipstick SMALL (*)    Protein, ur 30 (*)    Leukocytes,Ua TRACE (*)    Bacteria, UA RARE (*)    All other components within normal limits  GLUCOSE, CAPILLARY - Abnormal; Notable for the following components:   Glucose-Capillary 160  (*)    All other components within normal limits  CBC - Abnormal; Notable for the following components:   RBC 3.27 (*)    Hemoglobin 10.2 (*)    HCT 31.8 (*)    All other components within normal limits  COMPREHENSIVE METABOLIC PANEL - Abnormal; Notable for the following components:   Potassium 3.3 (*)    Glucose, Bld 169 (*)    Alkaline Phosphatase 130 (*)    All other components within normal limits  SURGICAL PCR SCREEN  APTT  PROTIME-INR  TYPE AND SCREEN   PFTs > 5 years ago.   IMAGES: CT Head & CTA Head/Neck 09/18/21: IMPRESSION: 1. No acute intracranial pathology. 2. Predominantly soft plaque in the proximal right ICA resulting in approximately 80-90% stenosis. 3. Mixed plaque in the proximal left ICA results in approximately 60% stenosis. 4. Mild atherosclerotic plaque in the intracranial ICAs without hemodynamically significant stenosis. Otherwise, patent intracranial vasculature. 5. Interlobular septal thickening and patchy ground-glass opacities in the lung apices may reflect mild pulmonary interstitial edema. 6.  Aortic Atherosclerosis (  ICD10-I70.0).   MRI Head & MRA Head/Neck 09/17/21: IMPRESSION: Brain MRI: - No acute finding. - Intracranial MRA: - No emergent finding.  Mild atherosclerosis. Neck MRA: - Evidence of flow reducing stenosis at the right more than left proximal ICA. Suggest CTA characterization given the limitations of noncontrast MRA.   CXR 09/16/21: FINDINGS: Cardiac and mediastinal contours normal. Lungs are well aerated and clear. No infiltrate or effusion. Port-A-Cath tip in the SVC at the cavoatrial junction unchanged. IMPRESSION: No active cardiopulmonary disease.   EKG: 09/16/21: Normal sinus rhythm Low voltage QRS Cannot rule out Anterior infarct , age undetermined Abnormal ECG When compared with ECG of 02-Jan-2020 08:58, no significant change Confirmed by Madalyn Rob [phone]) on 09/16/2021 6:50:51 PM   CV: US Carotid  10/07/21: Summary:  - Right Carotid: Velocities in the right ICA are consistent with a 60-79%                 stenosis.  - Left Carotid: Patent stent with no increase of velocity.  - Vertebrals:  Bilateral vertebral arteries demonstrate antegrade flow.  - Subclavians: Normal flow hemodynamics were seen in bilateral subclavian               arteries.    Echo 09/17/21: IMPRESSIONS   1. Left ventricular ejection fraction, by estimation, is 65 to 70%. The  left ventricle has hyperdynamic function. The left ventricle has no  regional wall motion abnormalities. There is mild left ventricular  hypertrophy. Left ventricular diastolic  parameters are consistent with Grade I diastolic dysfunction (impaired  relaxation).   2. Right ventricular systolic function is normal. The right ventricular  size is normal. Tricuspid regurgitation signal is inadequate for assessing  PA pressure.   3. The mitral valve is normal in structure. No evidence of mitral valve  regurgitation. No evidence of mitral stenosis. Moderate mitral annular  calcification.   4. The aortic valve is tricuspid. Aortic valve regurgitation is not  visualized. Aortic valve sclerosis/calcification is present, without any  evidence of aortic stenosis.   5. The inferior vena cava is normal in size with greater than 50%  respiratory variability, suggesting right atrial pressure of 3 mmHg.    Lexiscan myoview stress test 06/25/17 (per [facility] Cardiovascular records):  1. Resting EKG NSR. Stress EKG: Non diagnostic for ischemia due to pharmacologic stress testing. No ST-T changes of ischemia. 8/10 chest pain with Lexiscan infusion. 2. Myocardial perfusion study was normal, without evidence of ischemia, ejection fraction 70%. Low risk study.   Cardiac event monitor 06/05/15-06/27/15: 1. NSR 2. ST 3. No arrythmias   Cardiac cath 12/03/00: CONCLUSIONS: 1. Essentially normal coronary arteries. 2. Normal left ventricular  function.   Past Medical History:  Diagnosis Date   Arthritis    "all over my body" (03/13/2013)   Asthma    Asymptomatic carotid artery stenosis, bilateral 10/08/2018   GERD (gastroesophageal reflux disease)    H/O hiatal hernia    Headache    pt states she has had headaches for about 6 months "off and on"   Heart murmur    pt had echocardiogram on 09/17/21   Hypercholesterolemia 10/08/2018   Hypertension    Hypothyroidism    Myasthenia gravis ([place])    "in my eyes; diagnsosed > 7 yr ago" (03/13/2013)   Sleep apnea    on CPAP   Stroke ([place]) 2021   Thyroid carcinoma ([place])    Type II diabetes mellitus ([place])     Past Surgical History:  Procedure Laterality  Date   ABDOMINAL HYSTERECTOMY     ANTERIOR CERVICAL DECOMP/DISCECTOMY FUSION     "I've had severa ORs; always went in from the front" (03/13/2013)   APPENDECTOMY     CARDIAC CATHETERIZATION     "several" (03/13/2013)   CARPAL TUNNEL RELEASE Right    CATARACT EXTRACTION W/ INTRAOCULAR LENS  IMPLANT, BILATERAL Bilateral    CHOLECYSTECTOMY     KNEE ARTHROSCOPY Left    SHOULDER ARTHROSCOPY W/ ROTATOR CUFF REPAIR Left    TONSILLECTOMY     TOTAL THYROIDECTOMY     TRANSCAROTID ARTERY REVASCULARIZATION  Left 09/24/2021   Procedure: LEFT TRANSCAROTID ARTERY REVASCULARIZATION;  Surgeon: Cherre Robins, MD;  Location: [facility] OR;  Service: Vascular;  Laterality: Left;   ULTRASOUND GUIDANCE FOR VASCULAR ACCESS Right 09/24/2021   Procedure: ULTRASOUND GUIDANCE FOR VASCULAR ACCESS;  Surgeon: Cherre Robins, MD;  Location: [facility] OR;  Service: Vascular;  Laterality: Right;    MEDICATIONS:  acetaminophen (TYLENOL) 500 MG tablet   aspirin EC 81 MG EC tablet   B-D ULTRAFINE III SHORT PEN 31G X 8 MM MISC   Biotin 5000 MCG CAPS   clopidogrel (PLAVIX) 75 MG tablet   cycloSPORINE (RESTASIS) 0.05 % ophthalmic emulsion   diclofenac Sodium (VOLTAREN) 1 % GEL   diphenhydrAMINE 25 mg in sodium chloride 0.9 % 50 mL   Elderberry 500 MG CAPS    empagliflozin (JARDIANCE) 25 MG TABS tablet   esomeprazole (NEXIUM) 40 MG capsule   Exenatide ER (BYDUREON BCISE) 2 MG/0.85ML AUIJ   ezetimibe (ZETIA) 10 MG tablet   Immune Globulin, Human, 40 GM/400ML SOLN   Insulin Disposable Pump (OMNIPOD DASH 5 PACK PODS) MISC   levothyroxine (SYNTHROID) 150 MCG tablet   lidocaine (LIDODERM) 5 %   loratadine (CLARITIN) 10 MG tablet   meclizine (ANTIVERT) 25 MG tablet   montelukast (SINGULAIR) 10 MG tablet   mycophenolate (CELLCEPT) 500 MG tablet   olmesartan (BENICAR) 20 MG tablet   predniSONE (DELTASONE) 10 MG tablet   PRESCRIPTION MEDICATION   PROAIR HFA 108 (90 BASE) MCG/ACT inhaler   riTUXimab (RITUXAN) 500 MG/50ML injection   rosuvastatin (CRESTOR) 40 MG tablet   spironolactone-hydrochlorothiazide (ALDACTAZIDE) 25-25 MG tablet   topiramate (TOPAMAX) 50 MG tablet   vitamin B-12 (CYANOCOBALAMIN) 1000 MCG tablet   Vitamin D, Ergocalciferol, (DRISDOL) 50000 UNITS CAPS capsule   No current facility-administered medications for this encounter.    Myra Gianotti, PA-C Surgical Short Stay/Anesthesiology [facility] Phone [phone] [facility] [place] Phone [phone] 10/16/2021 6:19 PM

## 2021-10-17 ENCOUNTER — Telehealth: Payer: Self-pay

## 2021-10-17 NOTE — Telephone Encounter (Signed)
Received notification from Myra Gianotti, PA-C that patient had some confusion regarding ASA and Plavix instructions prior to TCAR on 10/23/21 with Dr. Stanford Breed. Spoke with Ms. Heumann this morning. Stated she was informed by PAT to stop both ASA and Plavix. I reinforced that she should continue both medications, even on the day of surgery. Pt verbalized understanding and confirmed that she has not stopped either medication.

## 2021-10-22 ENCOUNTER — Other Ambulatory Visit (HOSPITAL_COMMUNITY)
Admission: RE | Admit: 2021-10-22 | Discharge: 2021-10-22 | Disposition: A | Discharge status: Home / Self Care | Payer: Medicare Other | Source: Ambulatory Visit | Attending: Vascular Surgery | Admitting: Vascular Surgery

## 2021-10-22 DIAGNOSIS — K449 Diaphragmatic hernia without obstruction or gangrene: Secondary | ICD-10-CM | POA: Diagnosis not present

## 2021-10-22 DIAGNOSIS — Z20822 Contact with and (suspected) exposure to covid-19: Secondary | ICD-10-CM | POA: Insufficient documentation

## 2021-10-22 DIAGNOSIS — Z794 Long term (current) use of insulin: Secondary | ICD-10-CM | POA: Diagnosis not present

## 2021-10-22 DIAGNOSIS — E1169 Type 2 diabetes mellitus with other specified complication: Secondary | ICD-10-CM | POA: Diagnosis not present

## 2021-10-22 DIAGNOSIS — Z981 Arthrodesis status: Secondary | ICD-10-CM | POA: Diagnosis not present

## 2021-10-22 DIAGNOSIS — Z01818 Encounter for other preprocedural examination: Secondary | ICD-10-CM

## 2021-10-22 DIAGNOSIS — M1990 Unspecified osteoarthritis, unspecified site: Secondary | ICD-10-CM | POA: Diagnosis not present

## 2021-10-22 DIAGNOSIS — Z7982 Long term (current) use of aspirin: Secondary | ICD-10-CM | POA: Diagnosis not present

## 2021-10-22 DIAGNOSIS — Z79890 Hormone replacement therapy (postmenopausal): Secondary | ICD-10-CM | POA: Diagnosis not present

## 2021-10-22 DIAGNOSIS — Z8673 Personal history of transient ischemic attack (TIA), and cerebral infarction without residual deficits: Secondary | ICD-10-CM | POA: Diagnosis not present

## 2021-10-22 DIAGNOSIS — Z7902 Long term (current) use of antithrombotics/antiplatelets: Secondary | ICD-10-CM | POA: Diagnosis not present

## 2021-10-22 DIAGNOSIS — Z7984 Long term (current) use of oral hypoglycemic drugs: Secondary | ICD-10-CM | POA: Diagnosis not present

## 2021-10-22 DIAGNOSIS — J4540 Moderate persistent asthma, uncomplicated: Secondary | ICD-10-CM | POA: Diagnosis not present

## 2021-10-22 DIAGNOSIS — I63231 Cerebral infarction due to unspecified occlusion or stenosis of right carotid arteries: Secondary | ICD-10-CM | POA: Diagnosis not present

## 2021-10-22 DIAGNOSIS — E7800 Pure hypercholesterolemia, unspecified: Secondary | ICD-10-CM | POA: Diagnosis not present

## 2021-10-22 DIAGNOSIS — Z01812 Encounter for preprocedural laboratory examination: Secondary | ICD-10-CM | POA: Insufficient documentation

## 2021-10-22 DIAGNOSIS — Z85850 Personal history of malignant neoplasm of thyroid: Secondary | ICD-10-CM | POA: Diagnosis not present

## 2021-10-22 DIAGNOSIS — I10 Essential (primary) hypertension: Secondary | ICD-10-CM | POA: Diagnosis not present

## 2021-10-22 DIAGNOSIS — E1140 Type 2 diabetes mellitus with diabetic neuropathy, unspecified: Secondary | ICD-10-CM | POA: Diagnosis not present

## 2021-10-22 DIAGNOSIS — Z7952 Long term (current) use of systemic steroids: Secondary | ICD-10-CM | POA: Diagnosis not present

## 2021-10-22 DIAGNOSIS — E890 Postprocedural hypothyroidism: Secondary | ICD-10-CM | POA: Diagnosis not present

## 2021-10-22 DIAGNOSIS — G4733 Obstructive sleep apnea (adult) (pediatric): Secondary | ICD-10-CM | POA: Diagnosis not present

## 2021-10-22 DIAGNOSIS — R1310 Dysphagia, unspecified: Secondary | ICD-10-CM | POA: Diagnosis not present

## 2021-10-22 DIAGNOSIS — Z79899 Other long term (current) drug therapy: Secondary | ICD-10-CM | POA: Diagnosis not present

## 2021-10-22 DIAGNOSIS — E1165 Type 2 diabetes mellitus with hyperglycemia: Secondary | ICD-10-CM | POA: Diagnosis not present

## 2021-10-22 DIAGNOSIS — I6521 Occlusion and stenosis of right carotid artery: Secondary | ICD-10-CM | POA: Diagnosis not present

## 2021-10-22 DIAGNOSIS — G7000 Myasthenia gravis without (acute) exacerbation: Secondary | ICD-10-CM | POA: Diagnosis not present

## 2021-10-22 DIAGNOSIS — E119 Type 2 diabetes mellitus without complications: Secondary | ICD-10-CM | POA: Diagnosis not present

## 2021-10-22 DIAGNOSIS — K219 Gastro-esophageal reflux disease without esophagitis: Secondary | ICD-10-CM | POA: Diagnosis not present

## 2021-10-22 LAB — SARS CORONAVIRUS 2 BY RT PCR (HOSPITAL ORDER, PERFORMED IN ~~LOC~~ HOSPITAL LAB): SARS Coronavirus 2: NEGATIVE

## 2021-10-23 ENCOUNTER — Encounter (HOSPITAL_COMMUNITY)
Admission: RE | Disposition: A | Discharge status: Home Health | Payer: Self-pay | Source: Home / Self Care | Attending: Vascular Surgery

## 2021-10-23 ENCOUNTER — Other Ambulatory Visit: Payer: Self-pay

## 2021-10-23 ENCOUNTER — Encounter (HOSPITAL_COMMUNITY): Payer: Self-pay | Admitting: Vascular Surgery

## 2021-10-23 ENCOUNTER — Inpatient Hospital Stay (HOSPITAL_COMMUNITY)
Admission: RE | Admit: 2021-10-23 | Discharge: 2021-10-27 | DRG: 036 | Disposition: A | Discharge status: Home Health | Payer: Medicare Other | Attending: Vascular Surgery | Admitting: Vascular Surgery

## 2021-10-23 ENCOUNTER — Inpatient Hospital Stay (HOSPITAL_COMMUNITY): Payer: Medicare Other | Admitting: Physician Assistant

## 2021-10-23 ENCOUNTER — Inpatient Hospital Stay (HOSPITAL_COMMUNITY): Payer: Medicare Other | Admitting: Vascular Surgery

## 2021-10-23 DIAGNOSIS — Z85850 Personal history of malignant neoplasm of thyroid: Secondary | ICD-10-CM

## 2021-10-23 DIAGNOSIS — I63231 Cerebral infarction due to unspecified occlusion or stenosis of right carotid arteries: Secondary | ICD-10-CM | POA: Diagnosis not present

## 2021-10-23 DIAGNOSIS — E890 Postprocedural hypothyroidism: Secondary | ICD-10-CM | POA: Diagnosis present

## 2021-10-23 DIAGNOSIS — I6529 Occlusion and stenosis of unspecified carotid artery: Secondary | ICD-10-CM | POA: Diagnosis present

## 2021-10-23 DIAGNOSIS — Z79899 Other long term (current) drug therapy: Secondary | ICD-10-CM

## 2021-10-23 DIAGNOSIS — Z981 Arthrodesis status: Secondary | ICD-10-CM

## 2021-10-23 DIAGNOSIS — Z794 Long term (current) use of insulin: Secondary | ICD-10-CM

## 2021-10-23 DIAGNOSIS — Z7902 Long term (current) use of antithrombotics/antiplatelets: Secondary | ICD-10-CM | POA: Diagnosis not present

## 2021-10-23 DIAGNOSIS — I6521 Occlusion and stenosis of right carotid artery: Principal | ICD-10-CM | POA: Diagnosis present

## 2021-10-23 DIAGNOSIS — I10 Essential (primary) hypertension: Secondary | ICD-10-CM

## 2021-10-23 DIAGNOSIS — J4540 Moderate persistent asthma, uncomplicated: Secondary | ICD-10-CM | POA: Diagnosis present

## 2021-10-23 DIAGNOSIS — E1165 Type 2 diabetes mellitus with hyperglycemia: Secondary | ICD-10-CM | POA: Diagnosis present

## 2021-10-23 DIAGNOSIS — E119 Type 2 diabetes mellitus without complications: Secondary | ICD-10-CM

## 2021-10-23 DIAGNOSIS — Z8673 Personal history of transient ischemic attack (TIA), and cerebral infarction without residual deficits: Secondary | ICD-10-CM

## 2021-10-23 DIAGNOSIS — K449 Diaphragmatic hernia without obstruction or gangrene: Secondary | ICD-10-CM | POA: Diagnosis present

## 2021-10-23 DIAGNOSIS — R1310 Dysphagia, unspecified: Secondary | ICD-10-CM | POA: Diagnosis present

## 2021-10-23 DIAGNOSIS — K219 Gastro-esophageal reflux disease without esophagitis: Secondary | ICD-10-CM | POA: Diagnosis present

## 2021-10-23 DIAGNOSIS — Z20822 Contact with and (suspected) exposure to covid-19: Secondary | ICD-10-CM | POA: Diagnosis present

## 2021-10-23 DIAGNOSIS — M1990 Unspecified osteoarthritis, unspecified site: Secondary | ICD-10-CM | POA: Diagnosis present

## 2021-10-23 DIAGNOSIS — E1169 Type 2 diabetes mellitus with other specified complication: Secondary | ICD-10-CM | POA: Diagnosis present

## 2021-10-23 DIAGNOSIS — G4733 Obstructive sleep apnea (adult) (pediatric): Secondary | ICD-10-CM | POA: Diagnosis present

## 2021-10-23 DIAGNOSIS — E1140 Type 2 diabetes mellitus with diabetic neuropathy, unspecified: Secondary | ICD-10-CM | POA: Diagnosis present

## 2021-10-23 DIAGNOSIS — Z91013 Allergy to seafood: Secondary | ICD-10-CM

## 2021-10-23 DIAGNOSIS — Z87892 Personal history of anaphylaxis: Secondary | ICD-10-CM

## 2021-10-23 DIAGNOSIS — Z7982 Long term (current) use of aspirin: Secondary | ICD-10-CM | POA: Diagnosis not present

## 2021-10-23 DIAGNOSIS — Z7984 Long term (current) use of oral hypoglycemic drugs: Secondary | ICD-10-CM | POA: Diagnosis not present

## 2021-10-23 DIAGNOSIS — Z7952 Long term (current) use of systemic steroids: Secondary | ICD-10-CM | POA: Diagnosis not present

## 2021-10-23 DIAGNOSIS — Z79890 Hormone replacement therapy (postmenopausal): Secondary | ICD-10-CM

## 2021-10-23 DIAGNOSIS — Z833 Family history of diabetes mellitus: Secondary | ICD-10-CM

## 2021-10-23 DIAGNOSIS — Z91048 Other nonmedicinal substance allergy status: Secondary | ICD-10-CM

## 2021-10-23 DIAGNOSIS — Z91041 Radiographic dye allergy status: Secondary | ICD-10-CM

## 2021-10-23 DIAGNOSIS — Z90710 Acquired absence of both cervix and uterus: Secondary | ICD-10-CM

## 2021-10-23 DIAGNOSIS — Z823 Family history of stroke: Secondary | ICD-10-CM

## 2021-10-23 DIAGNOSIS — Z8249 Family history of ischemic heart disease and other diseases of the circulatory system: Secondary | ICD-10-CM

## 2021-10-23 DIAGNOSIS — G7000 Myasthenia gravis without (acute) exacerbation: Secondary | ICD-10-CM | POA: Diagnosis present

## 2021-10-23 DIAGNOSIS — Z888 Allergy status to other drugs, medicaments and biological substances status: Secondary | ICD-10-CM

## 2021-10-23 DIAGNOSIS — E7800 Pure hypercholesterolemia, unspecified: Secondary | ICD-10-CM | POA: Diagnosis present

## 2021-10-23 DIAGNOSIS — Z825 Family history of asthma and other chronic lower respiratory diseases: Secondary | ICD-10-CM

## 2021-10-23 DIAGNOSIS — Z881 Allergy status to other antibiotic agents status: Secondary | ICD-10-CM

## 2021-10-23 HISTORY — PX: TRANSCAROTID ARTERY REVASCULARIZATIONÂ: SHX6778

## 2021-10-23 HISTORY — PX: ULTRASOUND GUIDANCE FOR VASCULAR ACCESS: SHX6516

## 2021-10-23 LAB — GLUCOSE, CAPILLARY
Glucose-Capillary: 185 mg/dL — ABNORMAL HIGH (ref 70–99)
Glucose-Capillary: 223 mg/dL — ABNORMAL HIGH (ref 70–99)
Glucose-Capillary: 247 mg/dL — ABNORMAL HIGH (ref 70–99)
Glucose-Capillary: 302 mg/dL — ABNORMAL HIGH (ref 70–99)
Glucose-Capillary: 295 mg/dL — ABNORMAL HIGH (ref 70–99)
Glucose-Capillary: 310 mg/dL — ABNORMAL HIGH (ref 70–99)

## 2021-10-23 SURGERY — TRANSCAROTID ARTERY REVASCULARIZATION (TCAR)
Anesthesia: General | Site: Neck | Laterality: Right

## 2021-10-23 MED ORDER — EMPAGLIFLOZIN 25 MG PO TABS
25.0000 mg | ORAL_TABLET | Freq: Every day | ORAL | Status: DC
  Administered 2021-10-24 – 2021-10-27 (×4): 25 mg via ORAL
  Filled 2021-10-23 (×4): qty 1

## 2021-10-23 MED ORDER — GUAIFENESIN-DM 100-10 MG/5ML PO SYRP
15.0000 mL | ORAL_SOLUTION | ORAL | Status: DC | PRN
  Filled 2021-10-23: qty 15

## 2021-10-23 MED ORDER — HYDROCORTISONE SOD SUC (PF) 100 MG IJ SOLR
INTRAMUSCULAR | Status: DC | PRN
  Administered 2021-10-23: 125 mg via INTRAVENOUS

## 2021-10-23 MED ORDER — ACETAMINOPHEN 325 MG RE SUPP
325.0000 mg | RECTAL | Status: DC | PRN
  Filled 2021-10-23: qty 2

## 2021-10-23 MED ORDER — LABETALOL HCL 5 MG/ML IV SOLN
10.0000 mg | INTRAVENOUS | Status: AC | PRN
  Administered 2021-10-23 – 2021-10-24 (×4): 10 mg via INTRAVENOUS
  Filled 2021-10-23 (×5): qty 4

## 2021-10-23 MED ORDER — MIDAZOLAM HCL 2 MG/2ML IJ SOLN
INTRAMUSCULAR | Status: AC
  Filled 2021-10-23: qty 2

## 2021-10-23 MED ORDER — LABETALOL HCL 5 MG/ML IV SOLN
INTRAVENOUS | Status: AC
  Filled 2021-10-23: qty 4

## 2021-10-23 MED ORDER — CHLORHEXIDINE GLUCONATE CLOTH 2 % EX PADS
6.0000 | MEDICATED_PAD | Freq: Every day | CUTANEOUS | Status: DC
  Administered 2021-10-25 – 2021-10-26 (×2): 6 via TOPICAL

## 2021-10-23 MED ORDER — AMISULPRIDE (ANTIEMETIC) 5 MG/2ML IV SOLN: 10.0000 mg | Freq: Once | INTRAVENOUS | Status: DC | PRN

## 2021-10-23 MED ORDER — FENTANYL CITRATE (PF) 100 MCG/2ML IJ SOLN
INTRAMUSCULAR | Status: AC
  Administered 2021-10-23: 50 ug via INTRAVENOUS
  Filled 2021-10-23: qty 2

## 2021-10-23 MED ORDER — POTASSIUM CHLORIDE CRYS ER 20 MEQ PO TBCR
20.0000 meq | EXTENDED_RELEASE_TABLET | Freq: Every day | ORAL | Status: AC | PRN
  Administered 2021-10-24: 20 meq via ORAL
  Filled 2021-10-23: qty 1

## 2021-10-23 MED ORDER — FENTANYL CITRATE (PF) 100 MCG/2ML IJ SOLN: 50.0000 ug | Freq: Once | INTRAMUSCULAR | Status: DC

## 2021-10-23 MED ORDER — PROPOFOL 10 MG/ML IV BOLUS
INTRAVENOUS | Status: DC | PRN
  Administered 2021-10-23: 50 mg via INTRAVENOUS
  Administered 2021-10-23: 200 mg via INTRAVENOUS

## 2021-10-23 MED ORDER — ACETAMINOPHEN 500 MG PO TABS: 1000.0000 mg | ORAL_TABLET | Freq: Once | ORAL | Status: DC

## 2021-10-23 MED ORDER — CLOPIDOGREL BISULFATE 75 MG PO TABS
75.0000 mg | ORAL_TABLET | Freq: Every day | ORAL | Status: DC
  Administered 2021-10-24 – 2021-10-27 (×4): 75 mg via ORAL
  Filled 2021-10-23 (×4): qty 1

## 2021-10-23 MED ORDER — LEVOTHYROXINE SODIUM 75 MCG PO TABS
150.0000 ug | ORAL_TABLET | Freq: Every day | ORAL | Status: DC
  Administered 2021-10-24 – 2021-10-27 (×4): 150 ug via ORAL
  Filled 2021-10-23 (×4): qty 2

## 2021-10-23 MED ORDER — INSULIN ASPART 100 UNIT/ML IJ SOLN
0.0000 [IU] | INTRAMUSCULAR | Status: AC | PRN
  Administered 2021-10-23: 6 [IU] via SUBCUTANEOUS
  Administered 2021-10-23: 4 [IU] via SUBCUTANEOUS

## 2021-10-23 MED ORDER — HEPARIN 6000 UNIT IRRIGATION SOLUTION
Status: AC
  Filled 2021-10-23: qty 500

## 2021-10-23 MED ORDER — FENTANYL CITRATE (PF) 100 MCG/2ML IJ SOLN
25.0000 ug | INTRAMUSCULAR | Status: DC | PRN
  Administered 2021-10-23 (×2): 50 ug via INTRAVENOUS

## 2021-10-23 MED ORDER — SODIUM CHLORIDE 0.9 % IV SOLN: INTRAVENOUS | Status: DC

## 2021-10-23 MED ORDER — DOCUSATE SODIUM 100 MG PO CAPS
100.0000 mg | ORAL_CAPSULE | Freq: Every day | ORAL | Status: DC
  Administered 2021-10-24 – 2021-10-27 (×4): 100 mg via ORAL
  Filled 2021-10-23 (×4): qty 1

## 2021-10-23 MED ORDER — LIDOCAINE 5 % EX PTCH: 1.0000 | MEDICATED_PATCH | Freq: Every day | CUTANEOUS | Status: DC | PRN

## 2021-10-23 MED ORDER — LORATADINE 10 MG PO TABS
10.0000 mg | ORAL_TABLET | Freq: Every day | ORAL | Status: DC
  Administered 2021-10-23 – 2021-10-26 (×4): 10 mg via ORAL
  Filled 2021-10-23 (×4): qty 1

## 2021-10-23 MED ORDER — CHLORHEXIDINE GLUCONATE CLOTH 2 % EX PADS
6.0000 | MEDICATED_PAD | Freq: Once | CUTANEOUS | Status: DC
  Administered 2021-10-23: 6 via TOPICAL

## 2021-10-23 MED ORDER — CLEVIDIPINE BUTYRATE 0.5 MG/ML IV EMUL
INTRAVENOUS | Status: AC
  Filled 2021-10-23: qty 50

## 2021-10-23 MED ORDER — HYDRALAZINE HCL 20 MG/ML IJ SOLN
5.0000 mg | INTRAMUSCULAR | Status: DC | PRN
  Administered 2021-10-24: 21:00:00 5 mg via INTRAVENOUS
  Filled 2021-10-23: qty 1

## 2021-10-23 MED ORDER — DIPHENHYDRAMINE HCL 50 MG/ML IJ SOLN
INTRAMUSCULAR | Status: DC | PRN
  Administered 2021-10-23: 25 mg via INTRAVENOUS

## 2021-10-23 MED ORDER — SUGAMMADEX SODIUM 200 MG/2ML IV SOLN
INTRAVENOUS | Status: DC | PRN
  Administered 2021-10-23: 400 mg via INTRAVENOUS

## 2021-10-23 MED ORDER — PROPOFOL 10 MG/ML IV BOLUS
INTRAVENOUS | Status: AC
  Filled 2021-10-23: qty 20

## 2021-10-23 MED ORDER — PHENYLEPHRINE HCL-NACL 20-0.9 MG/250ML-% IV SOLN
INTRAVENOUS | Status: DC | PRN
  Administered 2021-10-23: 40 ug/min via INTRAVENOUS
  Administered 2021-10-23: 25 ug/min via INTRAVENOUS

## 2021-10-23 MED ORDER — INSULIN ASPART 100 UNIT/ML IJ SOLN
0.0000 [IU] | Freq: Three times a day (TID) | INTRAMUSCULAR | Status: DC
  Administered 2021-10-23: 11 [IU] via SUBCUTANEOUS
  Administered 2021-10-24: 5 [IU] via SUBCUTANEOUS
  Administered 2021-10-24: 8 [IU] via SUBCUTANEOUS
  Administered 2021-10-24: 5 [IU] via SUBCUTANEOUS

## 2021-10-23 MED ORDER — HEPARIN SODIUM (PORCINE) 1000 UNIT/ML IJ SOLN
INTRAMUSCULAR | Status: DC | PRN
  Administered 2021-10-23: 8000 [IU] via INTRAVENOUS

## 2021-10-23 MED ORDER — LABETALOL HCL 5 MG/ML IV SOLN
5.0000 mg | Freq: Once | INTRAVENOUS | Status: AC
  Administered 2021-10-23: 5 mg via INTRAVENOUS

## 2021-10-23 MED ORDER — CEFAZOLIN SODIUM-DEXTROSE 2-4 GM/100ML-% IV SOLN
2.0000 g | Freq: Three times a day (TID) | INTRAVENOUS | Status: AC
  Administered 2021-10-23 – 2021-10-24 (×2): 2 g via INTRAVENOUS
  Filled 2021-10-23 (×2): qty 100

## 2021-10-23 MED ORDER — TOPIRAMATE 25 MG PO TABS
50.0000 mg | ORAL_TABLET | Freq: Every day | ORAL | Status: DC
  Administered 2021-10-23 – 2021-10-26 (×4): 50 mg via ORAL
  Filled 2021-10-23 (×4): qty 2

## 2021-10-23 MED ORDER — HYDROCORTISONE SOD SUC (PF) 250 MG IJ SOLR
INTRAMUSCULAR | Status: AC
  Filled 2021-10-23: qty 250

## 2021-10-23 MED ORDER — INSULIN GLARGINE-YFGN 100 UNIT/ML ~~LOC~~ SOLN
20.0000 [IU] | Freq: Every day | SUBCUTANEOUS | Status: DC
  Administered 2021-10-23 – 2021-10-27 (×5): 20 [IU] via SUBCUTANEOUS
  Filled 2021-10-23 (×5): qty 0.2

## 2021-10-23 MED ORDER — GLYCOPYRROLATE 0.2 MG/ML IJ SOLN
INTRAMUSCULAR | Status: DC | PRN
  Administered 2021-10-23 (×2): .1 mg via INTRAVENOUS

## 2021-10-23 MED ORDER — CLEVIDIPINE BUTYRATE 0.5 MG/ML IV EMUL
INTRAVENOUS | Status: DC | PRN
  Administered 2021-10-23: 1 mg/h via INTRAVENOUS

## 2021-10-23 MED ORDER — MECLIZINE HCL 25 MG PO TABS
25.0000 mg | ORAL_TABLET | Freq: Three times a day (TID) | ORAL | Status: DC | PRN
  Filled 2021-10-23: qty 1

## 2021-10-23 MED ORDER — ACETAMINOPHEN 325 MG PO TABS
325.0000 mg | ORAL_TABLET | ORAL | Status: DC | PRN
  Administered 2021-10-23: 19:00:00 325 mg via ORAL
  Administered 2021-10-24 – 2021-10-27 (×8): 650 mg via ORAL
  Filled 2021-10-23 (×9): qty 2

## 2021-10-23 MED ORDER — HYDROCHLOROTHIAZIDE 25 MG PO TABS
25.0000 mg | ORAL_TABLET | Freq: Every day | ORAL | Status: DC
  Administered 2021-10-24: 10:00:00 25 mg via ORAL
  Filled 2021-10-23 (×2): qty 1

## 2021-10-23 MED ORDER — FENTANYL CITRATE (PF) 250 MCG/5ML IJ SOLN
INTRAMUSCULAR | Status: DC | PRN
  Administered 2021-10-23: 50 ug via INTRAVENOUS
  Administered 2021-10-23: 100 ug via INTRAVENOUS
  Administered 2021-10-23 (×2): 50 ug via INTRAVENOUS

## 2021-10-23 MED ORDER — ROCURONIUM BROMIDE 10 MG/ML (PF) SYRINGE
PREFILLED_SYRINGE | INTRAVENOUS | Status: DC | PRN
  Administered 2021-10-23 (×2): 40 mg via INTRAVENOUS

## 2021-10-23 MED ORDER — OXYCODONE-ACETAMINOPHEN 5-325 MG PO TABS: 1.0000 | ORAL_TABLET | ORAL | Status: DC | PRN

## 2021-10-23 MED ORDER — 0.9 % SODIUM CHLORIDE (POUR BTL) OPTIME
TOPICAL | Status: DC | PRN
  Administered 2021-10-23: 1000 mL

## 2021-10-23 MED ORDER — MAGNESIUM SULFATE 2 GM/50ML IV SOLN: 2.0000 g | Freq: Every day | INTRAVENOUS | Status: DC | PRN

## 2021-10-23 MED ORDER — SODIUM CHLORIDE 0.9 % IV SOLN: 500.0000 mL | Freq: Once | INTRAVENOUS | Status: DC | PRN

## 2021-10-23 MED ORDER — METOPROLOL TARTRATE 5 MG/5ML IV SOLN
2.0000 mg | INTRAVENOUS | Status: DC | PRN
  Administered 2021-10-25: 2 mg via INTRAVENOUS
  Filled 2021-10-23: qty 5

## 2021-10-23 MED ORDER — IODIXANOL 320 MG/ML IV SOLN
INTRAVENOUS | Status: DC | PRN
  Administered 2021-10-23: 15 mL via INTRA_ARTERIAL

## 2021-10-23 MED ORDER — BISACODYL 10 MG RE SUPP: 10.0000 mg | Freq: Every day | RECTAL | Status: DC | PRN

## 2021-10-23 MED ORDER — LACTATED RINGERS IV SOLN: INTRAVENOUS | Status: DC | PRN

## 2021-10-23 MED ORDER — ONDANSETRON HCL 4 MG/2ML IJ SOLN: 4.0000 mg | Freq: Four times a day (QID) | INTRAMUSCULAR | Status: DC | PRN

## 2021-10-23 MED ORDER — ALUM & MAG HYDROXIDE-SIMETH 200-200-20 MG/5ML PO SUSP: 15.0000 mL | ORAL | Status: DC | PRN

## 2021-10-23 MED ORDER — MONTELUKAST SODIUM 10 MG PO TABS
10.0000 mg | ORAL_TABLET | ORAL | Status: DC
  Administered 2021-10-24 – 2021-10-27 (×4): 10 mg via ORAL
  Filled 2021-10-23 (×4): qty 1

## 2021-10-23 MED ORDER — FENTANYL CITRATE (PF) 250 MCG/5ML IJ SOLN
INTRAMUSCULAR | Status: AC
  Filled 2021-10-23: qty 5

## 2021-10-23 MED ORDER — DICLOFENAC SODIUM 1 % EX GEL
1.0000 "application " | Freq: Every day | CUTANEOUS | Status: DC | PRN
  Filled 2021-10-23: qty 100

## 2021-10-23 MED ORDER — ACETAMINOPHEN 500 MG PO TABS
1000.0000 mg | ORAL_TABLET | Freq: Once | ORAL | Status: AC
  Administered 2021-10-23: 1000 mg via ORAL
  Filled 2021-10-23: qty 2

## 2021-10-23 MED ORDER — PHENOL 1.4 % MT LIQD
1.0000 | OROMUCOSAL | Status: DC | PRN
  Filled 2021-10-23 (×2): qty 177

## 2021-10-23 MED ORDER — DEXAMETHASONE SODIUM PHOSPHATE 10 MG/ML IJ SOLN
INTRAMUSCULAR | Status: DC | PRN
  Administered 2021-10-23: 5 mg via INTRAVENOUS

## 2021-10-23 MED ORDER — CHLORHEXIDINE GLUCONATE 0.12 % MT SOLN
OROMUCOSAL | Status: AC
  Administered 2021-10-23: 15 mL
  Filled 2021-10-23: qty 15

## 2021-10-23 MED ORDER — INSULIN ASPART 100 UNIT/ML IJ SOLN
4.0000 [IU] | Freq: Three times a day (TID) | INTRAMUSCULAR | Status: DC
  Administered 2021-10-23 – 2021-10-27 (×13): 4 [IU] via SUBCUTANEOUS

## 2021-10-23 MED ORDER — LABETALOL HCL 5 MG/ML IV SOLN
INTRAVENOUS | Status: DC | PRN
  Administered 2021-10-23 (×2): 5 mg via INTRAVENOUS
  Administered 2021-10-23: 10 mg via INTRAVENOUS

## 2021-10-23 MED ORDER — IRBESARTAN 150 MG PO TABS
150.0000 mg | ORAL_TABLET | Freq: Every day | ORAL | Status: DC
Start: 2021-10-23 — End: 2021-10-27
  Administered 2021-10-23 – 2021-10-27 (×5): 150 mg via ORAL
  Filled 2021-10-23 (×7): qty 1

## 2021-10-23 MED ORDER — LIDOCAINE HCL (PF) 1 % IJ SOLN
INTRAMUSCULAR | Status: AC
  Filled 2021-10-23: qty 5

## 2021-10-23 MED ORDER — SPIRONOLACTONE 25 MG PO TABS
25.0000 mg | ORAL_TABLET | Freq: Every day | ORAL | Status: DC
  Administered 2021-10-24: 10:00:00 25 mg via ORAL
  Filled 2021-10-23 (×2): qty 1

## 2021-10-23 MED ORDER — SPIRONOLACTONE-HCTZ 25-25 MG PO TABS: 1.0000 | ORAL_TABLET | ORAL | Status: DC

## 2021-10-23 MED ORDER — PANTOPRAZOLE SODIUM 40 MG PO TBEC
40.0000 mg | DELAYED_RELEASE_TABLET | Freq: Every day | ORAL | Status: DC
  Administered 2021-10-24 – 2021-10-27 (×4): 40 mg via ORAL
  Filled 2021-10-23 (×4): qty 1

## 2021-10-23 MED ORDER — INSULIN ASPART 100 UNIT/ML IJ SOLN
INTRAMUSCULAR | Status: AC
  Administered 2021-10-23: 11 [IU] via SUBCUTANEOUS
  Filled 2021-10-23: qty 1

## 2021-10-23 MED ORDER — CLEVIDIPINE BUTYRATE 0.5 MG/ML IV EMUL: 0.0000 mg/h | Freq: Once | INTRAVENOUS | Status: DC

## 2021-10-23 MED ORDER — ACETAMINOPHEN 500 MG PO TABS
ORAL_TABLET | ORAL | Status: AC
  Filled 2021-10-23: qty 2

## 2021-10-23 MED ORDER — ALBUTEROL SULFATE (2.5 MG/3ML) 0.083% IN NEBU: 3.0000 mL | INHALATION_SOLUTION | Freq: Four times a day (QID) | RESPIRATORY_TRACT | Status: DC | PRN

## 2021-10-23 MED ORDER — POLYETHYLENE GLYCOL 3350 17 G PO PACK: 17.0000 g | PACK | Freq: Every day | ORAL | Status: DC | PRN

## 2021-10-23 MED ORDER — PROTAMINE SULFATE 10 MG/ML IV SOLN
INTRAVENOUS | Status: DC | PRN
  Administered 2021-10-23: 30 mg via INTRAVENOUS
  Administered 2021-10-23: 20 mg via INTRAVENOUS

## 2021-10-23 MED ORDER — ACETAMINOPHEN 160 MG/5ML PO SOLN: 325.0000 mg | Freq: Once | ORAL | Status: DC

## 2021-10-23 MED ORDER — ASPIRIN EC 81 MG PO TBEC
81.0000 mg | DELAYED_RELEASE_TABLET | Freq: Every day | ORAL | Status: DC
  Administered 2021-10-24 – 2021-10-27 (×4): 81 mg via ORAL
  Filled 2021-10-23 (×4): qty 1

## 2021-10-23 MED ORDER — CEFAZOLIN SODIUM-DEXTROSE 2-4 GM/100ML-% IV SOLN
2.0000 g | INTRAVENOUS | Status: AC
  Administered 2021-10-23: 2 g via INTRAVENOUS
  Filled 2021-10-23: qty 100

## 2021-10-23 MED ORDER — ROSUVASTATIN CALCIUM 20 MG PO TABS
40.0000 mg | ORAL_TABLET | Freq: Every day | ORAL | Status: DC
Start: 2021-10-24 — End: 2021-10-27
  Administered 2021-10-24 – 2021-10-27 (×4): 40 mg via ORAL
  Filled 2021-10-23 (×4): qty 2

## 2021-10-23 MED ORDER — HEPARIN 6000 UNIT IRRIGATION SOLUTION
Status: DC | PRN
  Administered 2021-10-23: 1

## 2021-10-23 MED ORDER — EZETIMIBE 10 MG PO TABS
10.0000 mg | ORAL_TABLET | Freq: Every day | ORAL | Status: DC
  Administered 2021-10-24 – 2021-10-27 (×4): 10 mg via ORAL
  Filled 2021-10-23 (×4): qty 1

## 2021-10-23 MED ORDER — LIDOCAINE 2% (20 MG/ML) 5 ML SYRINGE
INTRAMUSCULAR | Status: DC | PRN
  Administered 2021-10-23: 60 mg via INTRAVENOUS

## 2021-10-23 MED ORDER — PREDNISONE 10 MG PO TABS
10.0000 mg | ORAL_TABLET | Freq: Every morning | ORAL | Status: DC
Start: 2021-10-24 — End: 2021-10-27
  Administered 2021-10-24 – 2021-10-27 (×4): 10 mg via ORAL
  Filled 2021-10-23 (×4): qty 1

## 2021-10-23 MED ORDER — CYCLOSPORINE 0.05 % OP EMUL
1.0000 [drp] | Freq: Two times a day (BID) | OPHTHALMIC | Status: DC
  Administered 2021-10-24 – 2021-10-27 (×7): 1 [drp] via OPHTHALMIC
  Filled 2021-10-23 (×10): qty 30

## 2021-10-23 MED ORDER — MORPHINE SULFATE (PF) 2 MG/ML IV SOLN: 2.0000 mg | INTRAVENOUS | Status: DC | PRN

## 2021-10-23 MED ORDER — PHENYLEPHRINE 40 MCG/ML (10ML) SYRINGE FOR IV PUSH (FOR BLOOD PRESSURE SUPPORT)
PREFILLED_SYRINGE | INTRAVENOUS | Status: DC | PRN
  Administered 2021-10-23 (×2): 120 ug via INTRAVENOUS
  Administered 2021-10-23 (×2): 80 ug via INTRAVENOUS

## 2021-10-23 MED ORDER — ONDANSETRON HCL 4 MG/2ML IJ SOLN
INTRAMUSCULAR | Status: DC | PRN
  Administered 2021-10-23: 4 mg via INTRAVENOUS

## 2021-10-23 SURGICAL SUPPLY — 58 items
BAG BANDED W/RUBBER/TAPE 36X54 (MISCELLANEOUS) ×2 IMPLANT
BALLN STERLING RX 6X30X80 (BALLOONS) ×2
BALLN STERLING RX 7X15X80 (BALLOONS) ×2
BALLOON STERLING RX 6X30X80 (BALLOONS) IMPLANT
BALLOON STERLING RX 7X15X80 (BALLOONS) IMPLANT
CANISTER SUCT 3000ML PPV (MISCELLANEOUS) ×2 IMPLANT
CATH BEACON 5 .035 40 KMP TP (CATHETERS) IMPLANT
CATH BEACON 5 .038 40 KMP TP (CATHETERS) ×1
CHLORAPREP W/TINT 26 (MISCELLANEOUS) ×4 IMPLANT
CLIP LIGATING EXTRA MED SLVR (CLIP) IMPLANT
CLIP LIGATING EXTRA SM BLUE (MISCELLANEOUS) IMPLANT
COVER DOME SNAP 22 D (MISCELLANEOUS) ×2 IMPLANT
COVER PROBE W GEL 5X96 (DRAPES) ×2 IMPLANT
DERMABOND ADHESIVE PROPEN (GAUZE/BANDAGES/DRESSINGS) ×1
DERMABOND ADVANCED (GAUZE/BANDAGES/DRESSINGS) ×1
DERMABOND ADVANCED .7 DNX12 (GAUZE/BANDAGES/DRESSINGS) IMPLANT
DERMABOND ADVANCED .7 DNX6 (GAUZE/BANDAGES/DRESSINGS) ×1 IMPLANT
DRAPE FEMORAL ANGIO 80X135IN (DRAPES) ×2 IMPLANT
DRSG TEGADERM 4X4.5 CHG (GAUZE/BANDAGES/DRESSINGS) ×1 IMPLANT
ELECT REM PT RETURN 9FT ADLT (ELECTROSURGICAL) ×2
ELECTRODE REM PT RTRN 9FT ADLT (ELECTROSURGICAL) ×1 IMPLANT
GAUZE SPONGE 2X2 8PLY NS (GAUZE/BANDAGES/DRESSINGS) ×1 IMPLANT
GAUZE SPONGE 4X4 12PLY STRL (GAUZE/BANDAGES/DRESSINGS) ×2 IMPLANT
GLOVE SURG POLYISO LF SZ8 (GLOVE) ×2 IMPLANT
GOWN STRL REUS W/ TWL LRG LVL3 (GOWN DISPOSABLE) ×2 IMPLANT
GOWN STRL REUS W/ TWL XL LVL3 (GOWN DISPOSABLE) ×1 IMPLANT
GOWN STRL REUS W/TWL LRG LVL3 (GOWN DISPOSABLE) ×2
GOWN STRL REUS W/TWL XL LVL3 (GOWN DISPOSABLE) ×1
GUIDEWIRE ENROUTE 0.014 (WIRE) ×2 IMPLANT
INTRODUCER KIT GALT 7CM (INTRODUCER) ×1
KIT BASIN OR (CUSTOM PROCEDURE TRAY) ×2 IMPLANT
KIT ENCORE 26 ADVANTAGE (KITS) ×3 IMPLANT
KIT INTRODUCER GALT 7 (INTRODUCER) ×1 IMPLANT
KIT TURNOVER KIT B (KITS) ×2 IMPLANT
NDL HYPO 25GX1X1/2 BEV (NEEDLE) IMPLANT
NEEDLE HYPO 25GX1X1/2 BEV (NEEDLE) IMPLANT
NS IRRIG 1000ML POUR BTL (IV SOLUTION) ×2 IMPLANT
PACK CAROTID (CUSTOM PROCEDURE TRAY) ×2 IMPLANT
PENCIL SMOKE EVACUATOR (MISCELLANEOUS) IMPLANT
POSITIONER HEAD DONUT 9IN (MISCELLANEOUS) ×2 IMPLANT
PROTECTION STATION PRESSURIZED (MISCELLANEOUS) ×2
SET MICROPUNCTURE 5F STIFF (MISCELLANEOUS) ×2 IMPLANT
STATION PROTECTION PRESSURIZED (MISCELLANEOUS) IMPLANT
STENT TRANSCAROTID SYSTEM 9X40 (Permanent Stent) ×1 IMPLANT
SUT MNCRL AB 4-0 PS2 18 (SUTURE) ×2 IMPLANT
SUT PROLENE 5 0 C 1 24 (SUTURE) ×2 IMPLANT
SUT PROLENE 6 0 BV (SUTURE) ×1 IMPLANT
SUT SILK 2 0 SH (SUTURE) ×2 IMPLANT
SUT VIC AB 3-0 SH 27 (SUTURE) ×1
SUT VIC AB 3-0 SH 27X BRD (SUTURE) ×1 IMPLANT
SYR 10ML LL (SYRINGE) ×6 IMPLANT
SYR 20ML LL LF (SYRINGE) ×2 IMPLANT
SYR CONTROL 10ML LL (SYRINGE) IMPLANT
SYSTEM TRANSCAROTID NEUROPRTCT (MISCELLANEOUS) ×1 IMPLANT
TOWEL GREEN STERILE (TOWEL DISPOSABLE) ×2 IMPLANT
TRANSCAROTID NEUROPROTECT SYS (MISCELLANEOUS) ×2
WATER STERILE IRR 1000ML POUR (IV SOLUTION) ×2 IMPLANT
WIRE BENTSON .035X145CM (WIRE) ×2 IMPLANT

## 2021-10-23 NOTE — Transfer of Care (Signed)
Immediate Anesthesia Transfer of Care Note ? ?Patient: [name] ? ?Procedure(s) Performed: Right Transcarotid Artery Revascularization (Right: Neck) ?ULTRASOUND GUIDANCE FOR VASCULAR ACCESS, LEFT FEMORAL VEIN (Right: Neck) ? ?Patient Location: PACU ? ?Anesthesia Type:General ? ?Level of Consciousness: drowsy ? ?Airway & Oxygen Therapy: Patient Spontanous Breathing and Patient connected to face mask oxygen ? ?Post-op Assessment: Report given to RN and Post -op Vital signs reviewed and stable ? ?Post vital signs: Reviewed and stable ? ?Last Vitals:  ?Vitals Value Taken Time  ?BP    ?Temp    ?Pulse 78 10/23/21 0931  ?Resp 20 10/23/21 0931  ?SpO2 97 % 10/23/21 0931  ?Vitals shown include unvalidated device data. ? ?Last Pain:  ?Vitals:  ? 10/23/21 0611  ?TempSrc:   ?PainSc: 0-No pain  ?   ? ?Patients Stated Pain Goal: 0 (10/23/21 4128) ? ?Complications: No notable events documented. ?

## 2021-10-23 NOTE — Anesthesia Procedure Notes (Signed)
Arterial Line Insertion ?Start/End3/09/2021 7:00 AM, 10/23/2021 7:05 AM ?Performed by: Suzette Battiest, MD, Vonna Drafts, CRNA, CRNA ? Patient location: Pre-op. ?Preanesthetic checklist: patient identified, IV checked, site marked, risks and benefits discussed, surgical consent, monitors and equipment checked, pre-op evaluation, timeout performed and anesthesia consent ?Lidocaine 1% used for infiltration ?Right, radial was placed ?Catheter size: 20 G ?Hand hygiene performed  and maximum sterile barriers used  ? ?Attempts: 1 ?Procedure performed without using ultrasound guided technique. ?Following insertion, dressing applied and Biopatch. ?Post procedure assessment: normal ? ?Patient tolerated the procedure well with no immediate complications. ? ? ?

## 2021-10-23 NOTE — Progress Notes (Signed)
?  Day of Surgery Note ? ? ? ?Subjective:  resting comfortably in pacu ? ? ?Vitals:  ? 10/23/21 1044 10/23/21 1059  ?BP: (!) 121/56 126/61  ?Pulse: 82 86  ?Resp: 11 16  ?Temp:    ?SpO2: 96% 92%  ? ? ?Incisions:   right neck incision is clean and dry; left groin with mild bloody ooze ?Neuro:  moving all extremities equally; tongue is midline ?Cardiac:  regular ?Lungs:  non labored ? ? ?Assessment/Plan:  This is a 71 y.o. female who is s/p  ?Right TCAR ? ?-pt doing well in recovery and neuro in tact ?-hypertensive requiring Cleviprex ?-mild ooze from left groin.  Manual pressure held for 20 minutes with resolution. ?-keep in recovery for now.  If unable to wean Cleviprex, may need ICU bed ? ? ?Leontine Locket, PA-C ?10/23/2021 ?11:02 AM ?[phone] ? ?

## 2021-10-23 NOTE — Progress Notes (Signed)
Dr. Rodman Comp taking over for Dr. Fransisco Beau stated to start peri-op hyperglycemic protocol and treat CBG of 185 with 4 units per protocol. New orders received and carried out.  ?

## 2021-10-23 NOTE — Plan of Care (Signed)

## 2021-10-23 NOTE — Progress Notes (Signed)
Inpatient Diabetes Program Recommendations ? ?AACE/ADA: New Consensus Statement on Inpatient Glycemic Control (2015) ? ?Target Ranges:  Prepandial:   less than 140 mg/dL ?     Peak postprandial:   less than 180 mg/dL (1-2 hours) ?     Critically ill patients:  140 - 180 mg/dL  ? ?Lab Results  ?Component Value Date  ? GLUCAP 295 (H) 10/23/2021  ? HGBA1C 9.7 (H) 09/17/2021  ? ? ?Review of Glycemic Control ? Latest Reference Range & Units 10/23/21 06:07 10/23/21 08:46 10/23/21 09:29 10/23/21 11:50 10/23/21 12:49  ?Glucose-Capillary 70 - 99 mg/dL 185 (H) 223 (H) 247 (H) 302 (H) 295 (H)  ?(H): Data is abnormally high ?Diabetes history: Type 2 DM ?Outpatient Diabetes medications: Omnipod ?Current orders for Inpatient glycemic control: none ?Decadron 5 mg x 1 ?Hydrocortisone 125 mg x 1 ? ?Inpatient Diabetes Program Recommendations:   ? ?In PACU. Consider adding Semglee 20 units QD and Novolog 4 units TID (assuming patient is consuming >50% of meals; once diet order placed). ? ?Called Dr Almetta Lovely office and obtained insulin pump settings see below. Will plan to reassess application of insulin on 3/3. ? ?Current insulin pump settings are as follows: ? ?Basal insulin  ?0000-0400 1.0 units/hour ?0401-1300 1.4 units/hour ?1301-1800 1.65 units/hour ?1801-0000 1.45 units/hour ?Total daily basal insulin: 33.55 units/24 hours ? ? ?Thanks, ?Bronson Curb, MSN, RNC-OB ?Diabetes Coordinator ?[phone] (8a-5p) ? ? ? ?

## 2021-10-23 NOTE — Discharge Instructions (Addendum)
? ?[facility] ? ?Discharge Instructions ?  ?Carotid Surgery ? ?Please refer to the following instructions for your post-procedure care. Your surgeon or physician assistant will discuss any changes with you. ? ?Activity ? ?You are encouraged to walk as much as you can. You can slowly return to normal activities but must avoid strenuous activity and heavy lifting until your doctor tell you it's okay. Avoid activities such as vacuuming or swinging a golf club. You can drive after one week if you are comfortable and you are no longer taking prescription pain medications. It is normal to feel tired for serval weeks after your surgery. It is also normal to have difficulty with sleep habits, eating, and bowel movements after surgery. These will go away with time. ? ?Bathing/Showering ? ?Shower daily after you go home. Do not soak in a bathtub, hot tub, or swim until the incision heals completely. ? ?Incision Care ? ?Shower every day. Clean your incision with mild soap and water. Pat the area dry with a clean towel. You do not need a bandage unless otherwise instructed. Do not apply any ointments or creams to your incision. You may have skin glue on your incision. Do not peel it off. It will come off on its own in about one week. Your incision may feel thickened and raised for several weeks after your surgery. This is normal and the skin will soften over time.  ? ?For Men Only: It's okay to shave around the incision but do not shave the incision itself for 2 weeks. It is common to have numbness under your chin that could last for several months. ? ?Diet ? ?Resume your normal diet. There are no special food restrictions following this procedure. A low fat/low cholesterol diet is recommended for all patients with vascular disease. In order to heal from your surgery, it is CRITICAL to get adequate nutrition. Your body requires vitamins, minerals, and protein. Vegetables are the best source of  vitamins and minerals. Vegetables also provide the perfect balance of protein. Processed food has little nutritional value, so try to avoid this. ? ?Medications ? ?Resume taking all of your medications unless your doctor or physician assistant tells you not to. If your incision is causing pain, you may take over-the- counter pain relievers such as acetaminophen (Tylenol). If you were prescribed a stronger pain medication, please be aware these medications can cause nausea and constipation. Prevent nausea by taking the medication with a snack or meal. Avoid constipation by drinking plenty of fluids and eating foods with a high amount of fiber, such as fruits, vegetables, and grains.  ?Do not take Tylenol if you are taking prescription pain medications. ? ?Your Aldactazide was discontinued in the hospital.  Please make an appt with Dr. Theda Sers for this week for post operative check on labs and medications/blood pressure.  ? ?Follow Up ? ?Our office will schedule a follow up appointment 2-3 weeks following discharge. ? ?Please call us immediately for any of the following conditions ? ?Increased pain, redness, drainage (pus) from your incision site. ?Fever of 101 degrees or higher. ?If you should develop stroke (slurred speech, difficulty swallowing, weakness on one side of your body, loss of vision) you should call 911 and go to the nearest emergency room. ? ?Reduce your risk of vascular disease: ? ?Stop smoking. If you would like help call [facility] at 1-800-QUIT-NOW [phone]) or [place] at [phone]. ?Manage your cholesterol ?Maintain a desired weight ?Control your diabetes ?Keep  your blood pressure down ? ?If you have any questions, please call the office at [phone]. ? ?

## 2021-10-23 NOTE — Anesthesia Procedure Notes (Signed)
Procedure Name: Intubation ?Date/Time: 10/23/2021 7:42 AM ?Performed by: Vonna Drafts, CRNA ?Pre-anesthesia Checklist: Patient identified, Emergency Drugs available, Suction available and Patient being monitored ?Patient Re-evaluated:Patient Re-evaluated prior to induction ?Oxygen Delivery Method: Circle system utilized ?Preoxygenation: Pre-oxygenation with 100% oxygen ?Induction Type: IV induction ?Ventilation: Mask ventilation without difficulty ?Laryngoscope Size: Mac and 3 ?Grade View: Grade II ?Tube type: Oral ?Tube size: 7.0 mm ?Number of attempts: 1 ?Airway Equipment and Method: Stylet and Oral airway ?Placement Confirmation: ETT inserted through vocal cords under direct vision, positive ETCO2 and breath sounds checked- equal and bilateral ?Secured at: 22 cm ?Tube secured with: Tape ?Dental Injury: Teeth and Oropharynx as per pre-operative assessment  ? ? ? ? ?

## 2021-10-23 NOTE — Progress Notes (Signed)
Patient states she was told to take her insulin pump off this am prior to coming for surgery. Insulin pump removed at 0445. CBG on arrival to unit is 185. Anesthesia notified because of hard stop on diabetic protocol. Dr. Fransisco Beau stated "I would not treat her sugar at this time". No new orders received.  ?

## 2021-10-23 NOTE — Op Note (Signed)
DATE OF SERVICE: 10/23/2021 ? ?PATIENT:  [name]  71 y.o. female ? ?PRE-OPERATIVE DIAGNOSIS:  asymptomatic critical right carotid artery stenosis ? ?POST-OPERATIVE DIAGNOSIS:  Same ? ?PROCEDURE:   ?Right transcarotid artery revascularization (TCAR) ? ?SURGEON:  Surgeon(s) and Role: ?   * Amijah Timothy, Yevonne Aline, MD - Primary ? ?ASSISTANT: Leontine Locket, PA-C ? ?An experienced assistant was required given the complexity of this procedure and the standard of surgical care. My assistant helped with exposure through counter tension, suctioning, ligation and retraction to better visualize the surgical field.  My assistant expedited sewing during the case by following my sutures. Wherever I use the term "we" in the report, my assistant actively helped me with that portion of the procedure. ? ?ANESTHESIA:   general ? ?EBL: minimal ? ?BLOOD ADMINISTERED:none ? ?DRAINS: none  ? ?LOCAL MEDICATIONS USED:  NONE ? ?SPECIMEN:  none ? ?COUNTS: confirmed correct. ? ?TOURNIQUET:  none ? ?PATIENT DISPOSITION:  PACU - hemodynamically stable. ?  ?Delay start of Pharmacological VTE agent (>24hrs) due to surgical blood loss or risk of bleeding: no ? ?INDICATION FOR PROCEDURE: [name] is a 71 y.o. female with asymptomatic critical right carotid artery stenosis. She previously underwent left TCAR for symptomatic stenosis. After careful discussion of risks, benefits, and alternatives the patient was offered right TCAR. We specifically discussed risk of stroke, cranial nerve injury, hematoma. The patient understood and wished to proceed. ? ?OPERATIVE FINDINGS: unremarkable TCAR. Good technical result from stenting. Patient awoke in OR neurologically intact. No issues in PACU.  ? ?DESCRIPTION OF PROCEDURE: After identification of the patient in the pre-operative holding area, the patient was transferred to the operating room. The patient was positioned supine on the operating room table. Anesthesia was induced. The right neck and  groins were prepped and draped in standard fashion. A surgical pause was performed confirming correct patient, procedure, and operative location. ? ?Using intraoperative ultrasound the course of the right common carotid artery was mapped on the skin.  A transverse incision was made between the sternal and clavicular heads of the sternocleidomastoid muscle, below the omohyoid. Following longitudinal division of the carotid sheath the jugular vein was partially skeletonized and retracted medially. Once 3 cm of common carotid artery (CCA) were isolated, umbilical tape was placed around the proximal 1/3 of the CCA under direct vision. A 5-O Prolene suture was pre-placed in the anterior wall of the CCA, in a ?U stitch? configuration,  ?close to the clavicle to facilitate hemostasis upon removal of the arterial sheath at completion of the TCAR procedure.  ? ?The contralateral common femoral vein (CFV) was accessed under ultrasound guidance, using standard Seldinger and micropuncture access technique. The venous return sheath was advanced into the CFV over the 0.035? wire provided. Blood was aspirated from the flow line followed by flushing of the Venous Sheath with heparinized saline. The Venous Sheath was secured to the patient?s skin with suture to maintain  ?optimal position in the vessel. Heparin was given to obtain a therapeutic activated clotting time >250 seconds prior to arterial access.  ? ?A 4-French non-stiffened ENHANCE? Transcarotid / Peripheral Access set was used, puncturing the artery with the 21G needle through the pre-placed ?U? stitch while holding gentle traction on the umbilical tape to stabilize and centralize the CCA within the incision. Careful attention was paid to the change in CCA shape when using the umbilical tape to control or lift the artery. The micropuncture wire was then advanced 3-4 cm into the CCA  and, the 21G needle was removed. The micropuncture sheath was advanced 2-3 cm into the CCA  and the wire and dilator were removed. Pulsatile backflow indicated correct positioning. The provided 0.035" J-tipped guidewire was inserted as close as possible to the bifurcation without engaging the lesion. After micropuncture sheath removal, the Transcarotid Arterial Sheath was advanced to the 2.5cm marker and the 0.035? wire and dilator were then removed. Arterial Sheath position was assessed under fluoroscopy in two projections to ensure that the sheath tip was oriented coaxially in the CCA. The Arterial Sheath was sutured to the patient with gentle forward tension. Blood was slowly aspirated followed by flushing with heparinized saline. No ingress of air bubbles through the passive hemostatic valve was observed. The stopcocks were closed. Traction applied to the CCA previously to facilitate access was gently released. ? ?The Flow Controller was connected to the Transcarotid Arterial Sheath, prepared by passively allowing a column of arterial blood to fill the line and connected to the Venous Return Sheath. CCA inflow was occluded proximal to the arteriotomy with a vascular clamp to achieve active flow reversal. To confirm flow reversal, a saline bolus was delivered into the venous flow line on both ?High? and ?Low? flow settings of the Flow Controller. Angiograms were performed with slow injections of a small amount of contrast filling just past the lesion to minimize antegrade transmission of micro-bubbles.  ?Prior to lesion manipulation, heart rate (62IRS) and systolic BP ([id number]) were managed upwards to optimize flow reversal and procedural neuroprotection. The lesion was crossed with an 0.014? ENROUTE? guidewire and pre-dilation of the lesion was performed with a 6x22mm rapid exchange 0.014? compatible balloon catheter to 8 atmospheres for 10 seconds. Stenting was performed with an 9x104mm ENROUTE? Transcarotid stent, sized appropriately to the right CCA.  A total of 15 minutes of flow reversal was  used. ? ?AP and lateral angiograms (gentle contrast injections) were performed to confirm stent placement and arterial wall stent apposition. At [facility] case completion, antegrade flow was restored by releasing the clamp on the CCA then closing the NPS stopcocks to the flow lines. The Transcarotid Arterial Sheath was removed and the pre-closure suture was tied. Heparin reversal was employed and a drain was placed. The Venous Return Sheath was removed and hemostasis was achieved with brief manual compression.  ? ?Upon completion of the case instrument and sharps counts were confirmed correct. The patient was transferred to the PACU in good condition. I was present for all portions of the procedure. ? ?Yevonne Aline. Stanford Breed, MD ?[facility] [place] ?Office Phone Number: [phone] ?10/23/2021 11:39 AM ? ? ? ?

## 2021-10-23 NOTE — Interval H&P Note (Signed)
History and Physical Interval Note: ? ?10/23/2021 ?7:23 AM ? ?[name]  has presented today for surgery, with the diagnosis of RIGHT CAROTID STENOSIS.  The various methods of treatment have been discussed with the patient and family. After consideration of risks, benefits and other options for treatment, the patient has consented to  Procedure(s): ?Right Transcarotid Artery Revascularization (Right) as a surgical intervention.  The patient's history has been reviewed, patient examined, no change in status, stable for surgery.  I have reviewed the patient's chart and labs.  Questions were answered to the patient's satisfaction.   ? ? ?Cherre Robins ? ? ?

## 2021-10-24 ENCOUNTER — Encounter (HOSPITAL_COMMUNITY): Payer: Self-pay | Admitting: Vascular Surgery

## 2021-10-24 LAB — CBC
HCT: 25.6 % — ABNORMAL LOW (ref 36.0–46.0)
Hemoglobin: 7.9 g/dL — ABNORMAL LOW (ref 12.0–15.0)
MCH: 30 pg (ref 26.0–34.0)
MCHC: 30.9 g/dL (ref 30.0–36.0)
MCV: 97.3 fL (ref 80.0–100.0)
Platelets: 209 10*3/uL (ref 150–400)
RBC: 2.63 MIL/uL — ABNORMAL LOW (ref 3.87–5.11)
RDW: 13.7 % (ref 11.5–15.5)
WBC: 7.3 10*3/uL (ref 4.0–10.5)
nRBC: 0 % (ref 0.0–0.2)

## 2021-10-24 LAB — BASIC METABOLIC PANEL
Anion gap: 11 (ref 5–15)
BUN: 20 mg/dL (ref 8–23)
CO2: 17 mmol/L — ABNORMAL LOW (ref 22–32)
Calcium: 8.4 mg/dL — ABNORMAL LOW (ref 8.9–10.3)
Chloride: 109 mmol/L (ref 98–111)
Creatinine, Ser: 1.08 mg/dL — ABNORMAL HIGH (ref 0.44–1.00)
GFR, Estimated: 56 mL/min — ABNORMAL LOW (ref >60)
Glucose, Bld: 324 mg/dL — ABNORMAL HIGH (ref 70–99)
Potassium: 3.5 mmol/L (ref 3.5–5.1)
Sodium: 137 mmol/L (ref 135–145)

## 2021-10-24 LAB — LIPID PANEL
Cholesterol: 140 mg/dL (ref 0–200)
HDL: 58 mg/dL (ref >40)
LDL Cholesterol: 58 mg/dL (ref 0–99)
Total CHOL/HDL Ratio: 2.4 RATIO
Triglycerides: 122 mg/dL (ref <150)
VLDL: 24 mg/dL (ref 0–40)

## 2021-10-24 LAB — GLUCOSE, CAPILLARY
Glucose-Capillary: 231 mg/dL — ABNORMAL HIGH (ref 70–99)
Glucose-Capillary: 255 mg/dL — ABNORMAL HIGH (ref 70–99)
Glucose-Capillary: 233 mg/dL — ABNORMAL HIGH (ref 70–99)
Glucose-Capillary: 212 mg/dL — ABNORMAL HIGH (ref 70–99)
Glucose-Capillary: 302 mg/dL — ABNORMAL HIGH (ref 70–99)

## 2021-10-24 LAB — POCT ACTIVATED CLOTTING TIME: Activated Clotting Time: 341 seconds

## 2021-10-24 MED ORDER — INSULIN ASPART 100 UNIT/ML IJ SOLN
0.0000 [IU] | Freq: Three times a day (TID) | INTRAMUSCULAR | Status: DC
  Administered 2021-10-25: 11 [IU] via SUBCUTANEOUS
  Administered 2021-10-25: 5 [IU] via SUBCUTANEOUS
  Administered 2021-10-25: 3 [IU] via SUBCUTANEOUS
  Administered 2021-10-25: 5 [IU] via SUBCUTANEOUS
  Administered 2021-10-26: 3 [IU] via SUBCUTANEOUS
  Administered 2021-10-26: 11 [IU] via SUBCUTANEOUS
  Administered 2021-10-26: 5 [IU] via SUBCUTANEOUS
  Administered 2021-10-27: 3 [IU] via SUBCUTANEOUS
  Administered 2021-10-27: 2 [IU] via SUBCUTANEOUS
  Administered 2021-10-27: 5 [IU] via SUBCUTANEOUS

## 2021-10-24 NOTE — Anesthesia Postprocedure Evaluation (Signed)
Anesthesia Post Note ? ?Patient: [name] ? ?Procedure(s) Performed: Right Transcarotid Artery Revascularization (Right: Neck) ?ULTRASOUND GUIDANCE FOR VASCULAR ACCESS, LEFT FEMORAL VEIN (Right: Neck) ? ?  ? ?Patient location during evaluation: PACU ?Anesthesia Type: General ?Level of consciousness: awake and alert ?Pain management: pain level controlled ?Vital Signs Assessment: post-procedure vital signs reviewed and stable ?Respiratory status: spontaneous breathing, nonlabored ventilation, respiratory function stable and patient connected to nasal cannula oxygen ?Cardiovascular status: blood pressure returned to baseline and stable ?Postop Assessment: no apparent nausea or vomiting ?Anesthetic complications: no ? ? ?No notable events documented. ? ?Last Vitals:  ?Vitals:  ? 10/24/21 1800 10/24/21 1900  ?BP: (!) 161/66 (!) 159/65  ?Pulse: 93 90  ?Resp: 18 16  ?Temp:  36.9 ?C  ?SpO2: 93% 94%  ?  ?Last Pain:  ?Vitals:  ? 10/24/21 1900  ?TempSrc: Oral  ?PainSc:   ? ? ?  ?  ?  ?  ?  ?  ? ?Suzette Battiest E ? ? ? ? ?

## 2021-10-24 NOTE — Progress Notes (Addendum)
Inpatient Diabetes Program Recommendations ? ?AACE/ADA: New Consensus Statement on Inpatient Glycemic Control (2015) ? ?Target Ranges:  Prepandial:   less than 140 mg/dL ?     Peak postprandial:   less than 180 mg/dL (1-2 hours) ?     Critically ill patients:  140 - 180 mg/dL  ? ?Lab Results  ?Component Value Date  ? GLUCAP 212 (H) 10/24/2021  ? HGBA1C 9.7 (H) 09/17/2021  ? ? ?Review of Glycemic Control ? Latest Reference Range & Units 10/24/21 08:57 10/24/21 11:43 10/24/21 16:30  ?Glucose-Capillary 70 - 99 mg/dL 255 (H) 233 (H) 212 (H)  ?(H): Data is abnormally high ? ?Diabetes history: DM2 ?Outpatient Diabetes medications: Omnipod ?Current insulin pump settings are as follows: ?Basal insulin  ?0000-0400       1.0 units/hour ?0401-1300       1.4 units/hour ?1301-1800       1.65 units/hour ?1801-0000       1.45 units/hour ?Total daily basal insulin: 33.55 units/24 hours ? ?Current orders for Inpatient glycemic control:  ?Semglee 20 units QD ?Jardiance 25 mg QD ?Novolog 0-15 units TID ?Novolog 4 units TID ?Prednisone 10 mg QD ? ? ?Inpatient Diabetes Program Recommendations:   ? ?Might consider placing insulin pump back on in am (24 hours after last basal dose or Semglee 30 units QAM.  ? ?Will continue to follow while inpatient. ? ?Thank you, ?Reche Dixon, MSN, RN ?Diabetes Coordinator ?Inpatient Diabetes Program ?[phone] (team pager from 8a-5p) ? ? ? ?

## 2021-10-24 NOTE — Progress Notes (Addendum)
?  Progress Note ? ? ? ?10/24/2021 ?8:22 AM ?1 Day Post-Op ? ?Subjective:  right neck incisional soreness. Says she just feels tired this morning. Some throat soreness as well. Tolerated diet yesterday. Ambulated to bathroom ? ?Afebrile ?SBP 115-160's ?Sp02 94% RA ? ?Vitals:  ? 10/24/21 0600 10/24/21 0700  ?BP: (!) 140/52   ?Pulse: 83   ?Resp: 12   ?Temp:  98.9 ?F (37.2 ?C)  ?SpO2: 97%   ? ?Physical Exam: ?Cardiac:  regular ?Lungs:  non labored ?Incisions:  right neck incision, mild fullness, soft. No hematoma ?Extremities:  moving all extremities. Mild right upper and lower extremity weakness at baseline ?Neurologic: alert and oriented. CN intact. Smile symmetric. Tongue midline. Speech coherent. Sensation is intact and equal bilaterally.  ? ?CBC ?   ?Component Value Date/Time  ? WBC 7.3 10/24/2021 0156  ? RBC 2.63 (L) 10/24/2021 0156  ? HGB 7.9 (L) 10/24/2021 0156  ? HCT 25.6 (L) 10/24/2021 0156  ? PLT 209 10/24/2021 0156  ? MCV 97.3 10/24/2021 0156  ? MCH 30.0 10/24/2021 0156  ? MCHC 30.9 10/24/2021 0156  ? RDW 13.7 10/24/2021 0156  ? LYMPHSABS 1.8 09/20/2021 0400  ? MONOABS 0.5 09/20/2021 0400  ? EOSABS 0.1 09/20/2021 0400  ? BASOSABS 0.0 09/20/2021 0400  ? ? ?BMET ?   ?Component Value Date/Time  ? NA 137 10/24/2021 0156  ? NA 143 05/31/2019 1101  ? K 3.5 10/24/2021 0156  ? CL 109 10/24/2021 0156  ? CO2 17 (L) 10/24/2021 0156  ? GLUCOSE 324 (H) 10/24/2021 0156  ? BUN 20 10/24/2021 0156  ? BUN 24 05/31/2019 1101  ? CREATININE 1.08 (H) 10/24/2021 0156  ? CALCIUM 8.4 (L) 10/24/2021 0156  ? GFRNONAA 56 (L) 10/24/2021 0156  ? GFRAA >60 12/28/2019 1159  ? ? ?INR ?   ?Component Value Date/Time  ? INR 1.0 10/15/2021 1004  ? ? ? ?Intake/Output Summary (Last 24 hours) at 10/24/2021 2353 ?Last data filed at 10/24/2021 0400 ?Gross per 24 hour  ?Intake 2045.31 ml  ?Output 550 ml  ?Net 1495.31 ml  ? ? ? ?Assessment/Plan:  71 y.o. female is s/p R TCAR 1 Day Post-Op  ? ?Neurologically intact. At baseline ?Right neck incision is clean,  dry and intact. Femoral access site c/d/i ?Remains hypertensive. Off Cleviprex. Given Labetalol this morning ?Continue Plavix, Aspirin, Statin ?Continue home BP medications ?Hyperglycemic. Appreciate DM coordinator recs ?Encourage ambulation today ?May be able to transfer to floor later today ? ?Karoline Caldwell, PA-C ?[facility] ?[phone] ?10/24/2021 ?8:22 AM ? ?VASCULAR STAFF ADDENDUM: ?I have independently interviewed and examined the patient. ?I agree with the above.  ?Looks good POD#1 R TCAR for asx stenosis ?Continue ASA / Plavix x 1 month ?Ready for floor. ?Hopefully home tomorrow. ? ?Yevonne Aline. Stanford Breed, MD ?[facility] [place] ?Office Phone Number: [phone] ?10/24/2021 12:45 PM ? ? ?

## 2021-10-24 NOTE — Progress Notes (Signed)
PHARMACIST LIPID MONITORING ? ? ?[name] is a 71 y.o. female admitted on 10/23/2021 with CVD s/p R carotid artery revasculaization.  Pharmacy has been consulted to optimize lipid-lowering therapy with the indication of secondary prevention for clinical ASCVD. ? ?Recent Labs: ? ?Lipid Panel (last 6 months):   ?Lab Results  ?Component Value Date  ? CHOL 140 10/24/2021  ? TRIG 122 10/24/2021  ? HDL 58 10/24/2021  ? CHOLHDL 2.4 10/24/2021  ? VLDL 24 10/24/2021  ? [place] 58 10/24/2021  ? ? ?Hepatic function panel (last 6 months):   ?Lab Results  ?Component Value Date  ? AST 22 10/15/2021  ? ALT 26 10/15/2021  ? ALKPHOS 130 (H) 10/15/2021  ? BILITOT 0.3 10/15/2021  ? ? ?SCr (since admission):   ?Serum creatinine: 1.08 mg/dL (H) 10/24/21 0156 ?Estimated creatinine clearance: 53.2 mL/min (A) ? ?Current therapy and lipid therapy tolerance ?Current lipid-lowering therapy: Rosuvastatin and zetia ?Documented or reported allergies or intolerances to lipid-lowering therapies (if applicable): None ? ?Assessment:   ?LDL 58 at goal no change  ? ?Plan:   ? ?Continue rosuvastatin 40mg  daily and zetia 10mg  daily  ? ? ? ?Bonnita Nasuti Pharm.D. CPP, BCPS ?Clinical Pharmacist ?[phone] ?10/24/2021 4:09 PM  ? ?

## 2021-10-25 LAB — GLUCOSE, CAPILLARY
Glucose-Capillary: 178 mg/dL — ABNORMAL HIGH (ref 70–99)
Glucose-Capillary: 226 mg/dL — ABNORMAL HIGH (ref 70–99)
Glucose-Capillary: 322 mg/dL — ABNORMAL HIGH (ref 70–99)
Glucose-Capillary: 220 mg/dL — ABNORMAL HIGH (ref 70–99)

## 2021-10-25 NOTE — Progress Notes (Addendum)
?  Progress Note ? ? ? ?10/25/2021 ?8:34 AM ?2 Days Post-Op ? ?Subjective: Tired this morning, hydralazine x1 overnight.  Complained of some chest pain yesterday evening, this resolved ?Afebrile ? ? ?Vitals:  ? 10/25/21 0702 10/25/21 0800  ?BP: (!) 166/71 (!) 172/113  ?Pulse: 94 85  ?Resp: 18 20  ?Temp: 98 ?F (36.7 ?C)   ?SpO2: 98% 98%  ? ?Physical Exam: ?Cardiac:  regular ?Lungs:  non labored ?Incisions:  right neck incision, mild fullness, soft. No hematoma ?Extremities:  moving all extremities. Mild right upper and lower extremity weakness at baseline ?Neurologic: alert and oriented. CN intact. Smile symmetric. Tongue midline. Speech coherent. Sensation is intact and equal bilaterally.  ? ?CBC ?   ?Component Value Date/Time  ? WBC 7.3 10/24/2021 0156  ? RBC 2.63 (L) 10/24/2021 0156  ? HGB 7.9 (L) 10/24/2021 0156  ? HCT 25.6 (L) 10/24/2021 0156  ? PLT 209 10/24/2021 0156  ? MCV 97.3 10/24/2021 0156  ? MCH 30.0 10/24/2021 0156  ? MCHC 30.9 10/24/2021 0156  ? RDW 13.7 10/24/2021 0156  ? LYMPHSABS 1.8 09/20/2021 0400  ? MONOABS 0.5 09/20/2021 0400  ? EOSABS 0.1 09/20/2021 0400  ? BASOSABS 0.0 09/20/2021 0400  ? ? ?BMET ?   ?Component Value Date/Time  ? NA 137 10/24/2021 0156  ? NA 143 05/31/2019 1101  ? K 3.5 10/24/2021 0156  ? CL 109 10/24/2021 0156  ? CO2 17 (L) 10/24/2021 0156  ? GLUCOSE 324 (H) 10/24/2021 0156  ? BUN 20 10/24/2021 0156  ? BUN 24 05/31/2019 1101  ? CREATININE 1.08 (H) 10/24/2021 0156  ? CALCIUM 8.4 (L) 10/24/2021 0156  ? GFRNONAA 56 (L) 10/24/2021 0156  ? GFRAA >60 12/28/2019 1159  ? ? ?INR ?   ?Component Value Date/Time  ? INR 1.0 10/15/2021 1004  ? ? ? ?Intake/Output Summary (Last 24 hours) at 10/25/2021 0834 ?Last data filed at 10/24/2021 2300 ?Gross per 24 hour  ?Intake 1130 ml  ?Output --  ?Net 1130 ml  ? ? ? ? ?Assessment/Plan:  71 y.o. female is s/p R TCAR 2 Days Post-Op  ? ?Neurologically intact - At baseline with right lower extremity strength ?Right neck incision is clean, dry and intact.  Femoral access site c/d/i ?Remains hypertensive.  Likely to reach steady state on irbesartan over the weekend/Monday ?Continue Plavix, Aspirin, Statin ?Continue home BP medications ?Hyperglycemic. Appreciate DM coordinator recs ?Encourage ambulation today ?EKG this morning - without concern for NSTEMI/STEMI ? ?Possibly home tomorrow ? ?Broadus John, PA-C ?[facility] ?[phone] ?10/25/2021 ?8:34 AM ? ? ?

## 2021-10-25 NOTE — Progress Notes (Addendum)
Patient BP 129/53 experiencing lightheadedness, blurry vision and a headache. Held spironolactone and HCTZ. MD paged. ? ?Irbesartan given per MD. ? ?Daymon Larsen, RN  ?

## 2021-10-25 NOTE — Progress Notes (Signed)
Patient brought to 4E from [place]. VSS. Telemetry box applied, CCMD notified. Patient oriented to room and staff. Call bell in reach.  ? ?Daymon Larsen, RN  ?

## 2021-10-26 LAB — GLUCOSE, CAPILLARY
Glucose-Capillary: 109 mg/dL — ABNORMAL HIGH (ref 70–99)
Glucose-Capillary: 247 mg/dL — ABNORMAL HIGH (ref 70–99)
Glucose-Capillary: 345 mg/dL — ABNORMAL HIGH (ref 70–99)
Glucose-Capillary: 194 mg/dL — ABNORMAL HIGH (ref 70–99)

## 2021-10-26 NOTE — Plan of Care (Signed)

## 2021-10-26 NOTE — Progress Notes (Addendum)
?  Progress Note ? ? ? ?10/26/2021 ?8:44 AM ?3 Days Post-Op ? ?Subjective:  Doing better today. Had waxing and waning L sided posterior headache that responded to tylenol. Has these at home at baseline. Blood pressure improved. ?Pt feels tired. ? ? ?Vitals:  ? 10/26/21 0409 10/26/21 0800  ?BP: (!) 120/53 (!) 128/49  ?Pulse: 81 81  ?Resp: 17 16  ?Temp: 97.9 ?F (36.6 ?C) 97.8 ?F (36.6 ?C)  ?SpO2: 95% 95%  ? ?Physical Exam: ?Lungs:  non labored ?Incisions:  right neck incision c/d/i ?Extremities:  moving all extremities well ?Neurologic: A&O; CN grossly intact ? ?CBC ?   ?Component Value Date/Time  ? WBC 7.3 10/24/2021 0156  ? RBC 2.63 (L) 10/24/2021 0156  ? HGB 7.9 (L) 10/24/2021 0156  ? HCT 25.6 (L) 10/24/2021 0156  ? PLT 209 10/24/2021 0156  ? MCV 97.3 10/24/2021 0156  ? MCH 30.0 10/24/2021 0156  ? MCHC 30.9 10/24/2021 0156  ? RDW 13.7 10/24/2021 0156  ? LYMPHSABS 1.8 09/20/2021 0400  ? MONOABS 0.5 09/20/2021 0400  ? EOSABS 0.1 09/20/2021 0400  ? BASOSABS 0.0 09/20/2021 0400  ? ? ?BMET ?   ?Component Value Date/Time  ? NA 137 10/24/2021 0156  ? NA 143 05/31/2019 1101  ? K 3.5 10/24/2021 0156  ? CL 109 10/24/2021 0156  ? CO2 17 (L) 10/24/2021 0156  ? GLUCOSE 324 (H) 10/24/2021 0156  ? BUN 20 10/24/2021 0156  ? BUN 24 05/31/2019 1101  ? CREATININE 1.08 (H) 10/24/2021 0156  ? CALCIUM 8.4 (L) 10/24/2021 0156  ? GFRNONAA 56 (L) 10/24/2021 0156  ? GFRAA >60 12/28/2019 1159  ? ? ?INR ?   ?Component Value Date/Time  ? INR 1.0 10/15/2021 1004  ? ? ? ?Intake/Output Summary (Last 24 hours) at 10/26/2021 0844 ?Last data filed at 10/25/2021 2300 ?Gross per 24 hour  ?Intake 720 ml  ?Output --  ?Net 720 ml  ? ? ? ?Assessment/Plan:  71 y.o. female is s/p R TCAR for high grade asymptomatic stenosis 3 Days Post-Op  ? ?Neuro exam remains at baseline ?Waxing and waning headache but improved since surgery; headache responds to tylenol; will rule out reperfusion injury with head CT if headache worsens ?Borderline hypotension; after discussion  with pharmacy we will discontinue spironolactone and HCTZ for now; patient can follow up with PCP as an outpatient ?OOB and ambulate today ?Home likely tomorrow ? ? ?Dagoberto Ligas, PA-C ?[facility] ?[phone] ?10/26/2021 ?8:44 AM ? ?VASCULAR STAFF ADDENDUM: ? ?Patient's blood pressure now normotensive, likely leading to the malaise she feels as well as the waxing and waning headache she has experienced.  Headache has improved.  We will continue to monitor this, we will pursue imaging should it worsen. ? ? ?J. Melene Muller, MD ?[facility] ?Office Phone Number: [phone] ?10/26/2021 9:04 AM ? ? ? ? ?

## 2021-10-27 LAB — GLUCOSE, CAPILLARY
Glucose-Capillary: 145 mg/dL — ABNORMAL HIGH (ref 70–99)
Glucose-Capillary: 194 mg/dL — ABNORMAL HIGH (ref 70–99)
Glucose-Capillary: 235 mg/dL — ABNORMAL HIGH (ref 70–99)

## 2021-10-27 NOTE — Care Management Important Message (Signed)
Important Message ? ?Patient Details  ?Name: [name] ?MRN: [id number] ?Date of Birth: 1950-09-18 ? ? ?Medicare Important Message Given:  Yes ? ? ? ? ?[name] ?10/27/2021, 1:01 PM ?

## 2021-10-27 NOTE — TOC Transition Note (Signed)
Transition of Care (TOC) - CM/SW Discharge Note ?Marvetta Gibbons Therapist, sports, BSN ?Transitions of Care ?Unit 4E- RN Case Manager ?See Treatment Team for direct phone #  ? ? ?Patient Details  ?Name: [name] ?MRN: [id number] ?Date of Birth: 08-Dec-1950 ? ?Transition of Care (TOC) CM/SW Contact:  ?Dahlia Client, Romeo Rabon, RN ?Phone Number: ?10/27/2021, 4:39 PM ? ? ?Clinical Narrative:    ?Pt stable for transition home today. Pt was active with [facility] services under protocol with [facility]- will resume services at discharge- [facility] aware and will reach back out to patient on return home. Orders placed for resumption of HHRN/PT. Pt has needed DME- no new DME needs noted.  ? ? ?Final next level of care: [place] ?Barriers to Discharge: No Barriers Identified ? ? ?Patient Goals and CMS Choice ?  ? Resumption of [facility] services ?  ? ?Discharge Placement ?  ?           ? Home w/ HH ?  ?  ?  ? ?Discharge Plan and Services ?  ?Discharge Planning Services: CM Consult ?Post Acute Care Choice: Home Health, Resumption of Svcs/PTA Provider          ?  ?  ?  ?  ?  ?HH Arranged: Therapist, sports, PT ?[place] Agency: [facility] ?Date HH Agency Contacted: 10/27/21 ?Time [place]: [id number] ?Representative spoke with at [place]: [place] ? ?Social Determinants of Health (SDOH) Interventions ?  ? ? ?Readmission Risk Interventions ?Readmission Risk Prevention Plan 10/27/2021 09/26/2021  ?Transportation Screening Complete Complete  ?PCP or Specialist Appt within 3-5 Days Complete -  ?[place] or Home Care Consult Complete -  ?Social Work Consult for [place] Planning/Counseling Complete -  ?Palliative Care Screening Not Applicable -  ?Medication Review Press photographer) Complete Complete  ?PCP or Specialist appointment within 3-5 days of discharge - Complete  ?[place] or Home Care Consult - Complete  ?SW Recovery Care/Counseling Consult - Complete  ?Palliative Care Screening - Not Applicable  ?[place] - Patient Refused  ?Some recent data  might be hidden  ? ? ? ? ? ?

## 2021-10-27 NOTE — Discharge Summary (Signed)
Discharge Summary     [name] 1951/06/22 71 y.o. female  [id number]  Admission Date: 10/23/2021  Discharge Date: 10/27/2021  Physician: No att. providers found  Admission Diagnosis: Asymptomatic carotid artery stenosis without infarction, right [I65.21] Carotid stenosis [I65.29]   HPI:   This is a 71 y.o. female with a left transcarotid revascularization on 09/24/2021 for symptomatic left carotid artery stenosis.  She tolerated this very well.  She had no focal neurologic deficits postoperatively.  She returns to clinic today.  She is doing very well.  Her right arm weakness continues to improve.  She is recovering well from her surgery.  She has healed her neck incision.  We reviewed her duplex findings today.  We had a long talk about the natural history of asymptomatic carotid artery stenosis.  Hospital Course:  The patient was admitted to the hospital and taken to the operating room on 10/23/2021 and underwent right TCAR    Findings: unremarkable TCAR. Good technical result from stenting. Patient awoke in OR neurologically intact. No issues in PACU.   The pt tolerated the procedure well and was transported to the PACU in good condition.   By POD 1, the pt neuro status was in tact.  She was hypertensive and requiring Cleviprex and this was weaned successfully.  She was transferred to the floor the next day.  She was continued on asa/plavix.    POD 2, pt did have some CP that resolved.  She did require one dose of hydralazine overnight.  DM coordinator working with pt and insulin pump.  EKG performed and no concern about MI.    POD 3, pt doing well but with waxing and waning of left sided posterior headache that responded well to Tylenol.  These are present at baseline prior to surgery.  BP improved.   POD 4, HA's continue to improve.  Some difficulty swallowing.  Her spironolactone and HCTZ were discontinued.  It was discussed with her to follow up with  her PCP for  evaluation later this week.   As the pt was able to eat and drink with only mild discomfort with swallowing, MD felt this was likely to resolve and ok to discharge.  Discharge Instructions     Discharge patient   Complete by: As directed    Discharge disposition: 01-Home or Self Care   Discharge patient date: 10/27/2021      Discharge Diagnosis:  Asymptomatic carotid artery stenosis without infarction, right [I65.21] Carotid stenosis [I65.29]  Secondary Diagnosis: Patient Active Problem List   Diagnosis Date Noted   Asymptomatic carotid artery stenosis without infarction, right 10/23/2021   Carotid stenosis 10/23/2021   Stroke-like symptoms    TIA (transient ischemic attack)    Hypokalemia 09/17/2021   Right facial numbness 09/16/2021   Insulin dependent type 2 diabetes mellitus ([place]) 09/16/2021   Hypothyroidism 09/16/2021   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[place]) 12/27/2019   Primary hypertension    Uncomplicated asthma    Uncontrolled type 2 diabetes mellitus with hyperglycemia ([place])    Thalamic stroke ([place]) 12/20/2019   Bilateral carotid artery stenosis 10/08/2018   Hyperlipidemia associated with type 2 diabetes mellitus ([place]) 10/08/2018   Headache around the eyes 11/12/2016   Cervicalgia of occipito-atlanto-axial region 11/12/2016   Moderate persistent asthma 07/29/2015   Current use of beta blocker 07/29/2015   Gastroesophageal reflux disease without esophagitis 07/29/2015   OSA on CPAP 09/19/2014   Diabetic neuropathy with neurologic complication ([place]) 15/40/0867   Myasthenia gravis in remission ([place])  09/19/2014   Dyspnea 08/13/2014   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   Cancer of thyroid ([place]) 07/27/2014   OSA (obstructive sleep apnea) 07/24/2014   DM (diabetes mellitus) type II uncontrolled with eye manifestation ([place]) 11/57/2620   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   Nocturia more than twice per night 06/01/2014   Snoring 06/01/2014   Insomnia due to medical  condition 06/01/2014   Neuroma 01/16/2014   Pseudoclaudication 11/14/2013   Heart palpitations 06/30/2013   Sinus tachycardia 03/29/2013   Myasthenia gravis ([place]) 03/13/2013   Essential hypertension, benign 03/13/2013   Asthma, chronic 03/13/2013   Past Medical History:  Diagnosis Date   Arthritis    "all over my body" (03/13/2013)   Asthma    Asymptomatic carotid artery stenosis, bilateral 10/08/2018   GERD (gastroesophageal reflux disease)    H/O hiatal hernia    Headache    pt states she has had headaches for about 6 months "off and on"   Heart murmur    pt had echocardiogram on 09/17/21   Hypercholesterolemia 10/08/2018   Hypertension    Hypothyroidism    Myasthenia gravis ([place])    "in my eyes; diagnsosed > 7 yr ago" (03/13/2013)   Sleep apnea    on CPAP   Stroke ([place]) 2021   Thyroid carcinoma ([place])    Type II diabetes mellitus ([facility])     Allergies as of 10/27/2021       Reactions   Contrast Media [iodinated Contrast Media] Anaphylaxis   01/10/15 and 09/18/2021 --PT GIVEN 13 HR PRE MEDS FOR CT with contrast TOLERATED IV CONTRAST W/O ANY REACTION   Fluorescein Shortness Of Breath, Other (See Comments)   (Dye)   Iodine Anaphylaxis   Contrast dye - iodine   Molds & Smuts Shortness Of Breath, Other (See Comments)   Congestion and wheezing, also   Shellfish-derived Products Anaphylaxis, Shortness Of Breath, Swelling, Other (See Comments)   Welts, also   Azithromycin Nausea Only   Codeine Hives, Nausea Only   Metformin Hcl Er Nausea Only   Tramadol Hcl Nausea Only   Other Rash, Other (See Comments)   Coban causes welts, also        Medication List     STOP taking these medications    spironolactone-hydrochlorothiazide 25-25 MG tablet Commonly known as: Aldactazide       TAKE these medications    acetaminophen 500 MG tablet Commonly known as: TYLENOL Take 500 mg by mouth every 6 (six) hours as needed (pain).   aspirin 81 MG EC tablet Take 1 tablet (81 mg  total) by mouth daily.   B-D ULTRAFINE III SHORT PEN 31G X 8 MM Misc Generic drug: Insulin Pen Needle   Biotin 5000 MCG Caps Take 5,000 mcg by mouth daily with breakfast.   Bydureon BCise 2 MG/0.85ML [place] Generic drug: Exenatide ER Inject 2 mg into the skin every Monday.   cycloSPORINE 0.05 % ophthalmic emulsion Commonly known as: RESTASIS Place 1 drop into both eyes 2 (two) times daily.   diclofenac Sodium 1 % Gel Commonly known as: VOLTAREN Apply 2 g topically 4 (four) times daily. What changed:  how much to take when to take this reasons to take this   diphenhydrAMINE 25 mg in sodium chloride 0.9 % 50 mL Inject 25 mg into the vein See admin instructions. Administer 25 mg intravenously at start of Gamunex infusion, then administer 25 mg during infusion   Elderberry 500 MG Caps Take 500 mg by mouth daily.  empagliflozin 25 MG Tabs tablet Commonly known as: JARDIANCE Take 25 mg by mouth daily.   esomeprazole 40 MG capsule Commonly known as: NEXIUM Take 1 capsule (40 mg total) by mouth 2 (two) times daily.   ezetimibe 10 MG tablet Commonly known as: ZETIA TAKE 1 TABLET BY MOUTH EVERY DAY   Immune Globulin (Human) 40 GM/400ML Soln Infusions are administered at home by home health nurse - last infusion 09/08/21 and 09/09/21; next infusion due 10/19/21 and 10/20/21   levothyroxine 150 MCG tablet Commonly known as: SYNTHROID Take 150 mcg by mouth daily before breakfast.   lidocaine 5 % Commonly known as: LIDODERM Place 1 patch onto the skin daily. Remove & Discard patch within 12 hours or as directed by MD What changed:  when to take this reasons to take this   loratadine 10 MG tablet Commonly known as: CLARITIN Take 1 tablet (10 mg total) by mouth at bedtime.   meclizine 25 MG tablet Commonly known as: ANTIVERT Take 25 mg by mouth 3 (three) times daily as needed for dizziness (or migraine-related nausea).   montelukast 10 MG tablet Commonly known as:  SINGULAIR Take 1 tablet (10 mg total) by mouth every morning.   mycophenolate 500 MG tablet Commonly known as: CELLCEPT Take 1,500 mg by mouth 2 (two) times daily. For myasthenia gravis   olmesartan 20 MG tablet Commonly known as: BENICAR Take 1 tablet (20 mg total) by mouth daily.   Omnipod DASH Pods (Gen 4) Misc Use with Humalog   predniSONE 10 MG tablet Commonly known as: DELTASONE Take 10 mg by mouth every morning. For myasthenia gravis   PRESCRIPTION MEDICATION Inhale into the lungs at bedtime. CPAP   ProAir HFA 108 (90 Base) MCG/ACT inhaler Generic drug: albuterol Inhale 2 puffs into the lungs every 6 (six) hours as needed for wheezing or shortness of breath.   Rituxan 500 MG/50ML injection Generic drug: riTUXimab Inject into the vein every 6 (six) months.   rosuvastatin 40 MG tablet Commonly known as: CRESTOR Take 1 tablet (40 mg total) by mouth daily.   topiramate 50 MG tablet Commonly known as: TOPAMAX Take 1 tablet (50 mg total) by mouth daily. Start 1 tablet daily x 1 week and then twice daily What changed:  when to take this additional instructions   vitamin B-12 1000 MCG tablet Commonly known as: CYANOCOBALAMIN Take 1,000 mcg by mouth daily.   Vitamin D (Ergocalciferol) 1.25 MG (50000 UNIT) Caps capsule Commonly known as: DRISDOL Take 50,000 Units by mouth every Monday.         [facility] Discharge Instructions Carotid Endarterectomy (CEA)  Please refer to the following instructions for your post-procedure care. Your surgeon or physician assistant will discuss any changes with you.  Activity  You are encouraged to walk as much as you can. You can slowly return to normal activities but must avoid strenuous activity and heavy lifting until your doctor tell you it's OK. Avoid activities such as vacuuming or swinging a golf club. You can drive after one week if you are comfortable and you are no longer taking  prescription pain medications. It is normal to feel tired for serval weeks after your surgery. It is also normal to have difficulty with sleep habits, eating, and bowel movements after surgery. These will go away with time.  Bathing/Showering  You may shower after you come home. Do not soak in a bathtub, hot tub, or swim until the incision heals completely.  Incision Care  Shower every day. Clean your incision with mild soap and water. Pat the area dry with a clean towel. You do not need a bandage unless otherwise instructed. Do not apply any ointments or creams to your incision. You may have skin glue on your incision. Do not peel it off. It will come off on its own in about one week. Your incision may feel thickened and raised for several weeks after your surgery. This is normal and the skin will soften over time. For Men Only: It's OK to shave around the incision but do not shave the incision itself for 2 weeks. It is common to have numbness under your chin that could last for several months.  Diet  Resume your normal diet. There are no special food restrictions following this procedure. A low fat/low cholesterol diet is recommended for all patients with vascular disease. In order to heal from your surgery, it is CRITICAL to get adequate nutrition. Your body requires vitamins, minerals, and protein. Vegetables are the best source of vitamins and minerals. Vegetables also provide the perfect balance of protein. Processed food has little nutritional value, so try to avoid this.  Medications  Resume taking all of your medications unless your doctor or physician assistant tells you not to.  If your incision is causing pain, you may take over-the- counter pain relievers such as acetaminophen (Tylenol). If you were prescribed a stronger pain medication, please be aware these medications can cause nausea and constipation.  Prevent nausea by taking the medication with a snack or meal. Avoid constipation  by drinking plenty of fluids and eating foods with a high amount of fiber, such as fruits, vegetables, and grains.  Do not take Tylenol if you are taking prescription pain medications.  Your Aldactazide was discontinued in the hospital.  Please make an appt with Dr. Theda Sers for this week for post operative check on labs and medications/blood pressure.   Follow Up  Our office will schedule a follow up appointment 2-3 weeks following discharge.  Please call us immediately for any of the following conditions  Increased pain, redness, drainage (pus) from your incision site. Fever of 101 degrees or higher. If you should develop stroke (slurred speech, difficulty swallowing, weakness on one side of your body, loss of vision) you should call 911 and go to the nearest emergency room.  Reduce your risk of vascular disease:  Stop smoking. If you would like help call [facility] at 1-800-QUIT-NOW (862)080-3828) or [facility] at [phone]. Manage your cholesterol Maintain a desired weight Control your diabetes Keep your blood pressure down  If you have any questions, please call the office at [phone].  Prescriptions given: none  Disposition: home  Patient's condition: is Good  Follow up: 1. [facility] in 4 weeks with carotid duplex   Leontine Locket, PA-C [facility] [phone]   --- For [facility] Registry use ---   Modified Rankin score at D/C (0-6): 0  IV medication needed for:  1. Hypertension: Yes 2. Hypotension: No  Post-op Complications: No  1. Post-op CVA or TIA: No  If yes: Event classification (right eye, left eye, right cortical, left cortical, verterobasilar, other): n/a  If yes: Timing of event (intra-op, <6 hrs post-op, >=6 hrs post-op, unknown): n/a  2. CN injury: No  If yes: CN n/a injuried   3. Myocardial infarction: No  If yes: Dx by (EKG or clinical, Troponin): n/a  4.  CHF: No  5.  Dysrhythmia (new): No  6. Wound infection:  No  7. Reperfusion symptoms: No  8. Return to OR: No  If yes: return to OR for (bleeding, neurologic, other CEA incision, other): n/a  Discharge medications: Statin use:  Yes ASA use:  Yes   Beta blocker use:  No ACE-Inhibitor use:  No  ARB use:  Yes CCB use: No P2Y12 Antagonist use: Yes, [ x] Plavix, '[ ]'$  Plasugrel, '[ ]'$  Ticlopinine, '[ ]'$  Ticagrelor, '[ ]'$  Other, '[ ]'$  No for medical reason, '[ ]'$  Non-compliant, '[ ]'$  Not-indicated Anti-coagulant use:  No, '[ ]'$  Warfarin, '[ ]'$  Rivaroxaban, '[ ]'$  Dabigatran,

## 2021-10-27 NOTE — Progress Notes (Addendum)
?  Progress Note ? ? ? ?10/27/2021 ?6:50 AM ?4 Days Post-Op ? ?Subjective:  says she is feeling better.  Still with some headaches but states she had them before surgery and they are a little bit better since surgery.  Says she has a sore throat and sometimes gets a little choked when swallowing but not always.   ? ?Afebrile ?HR 70's-100's NSR ?371'G-626'R systolic ?48% RA ? ?Vitals:  ? 10/27/21 0000 10/27/21 0400  ?BP: (!) 115/42 (!) 122/53  ?Pulse: 81 74  ?Resp: 14 16  ?Temp: 98.1 ?F (36.7 ?C) 98.8 ?F (37.1 ?C)  ?SpO2: 98% 97%  ?  ? ?Physical Exam: ?Neuro:  in tact;  right arm 3/5 left arm 5/5 right leg 3/5; left leg 5/5; tongue is midline ?Lungs:  non labored ?Incision:  clean with small hematoma present.   ? ?CBC ?   ?Component Value Date/Time  ? WBC 7.3 10/24/2021 0156  ? RBC 2.63 (L) 10/24/2021 0156  ? HGB 7.9 (L) 10/24/2021 0156  ? HCT 25.6 (L) 10/24/2021 0156  ? PLT 209 10/24/2021 0156  ? MCV 97.3 10/24/2021 0156  ? MCH 30.0 10/24/2021 0156  ? MCHC 30.9 10/24/2021 0156  ? RDW 13.7 10/24/2021 0156  ? LYMPHSABS 1.8 09/20/2021 0400  ? MONOABS 0.5 09/20/2021 0400  ? EOSABS 0.1 09/20/2021 0400  ? BASOSABS 0.0 09/20/2021 0400  ? ? ?BMET ?   ?Component Value Date/Time  ? NA 137 10/24/2021 0156  ? NA 143 05/31/2019 1101  ? K 3.5 10/24/2021 0156  ? CL 109 10/24/2021 0156  ? CO2 17 (L) 10/24/2021 0156  ? GLUCOSE 324 (H) 10/24/2021 0156  ? BUN 20 10/24/2021 0156  ? BUN 24 05/31/2019 1101  ? CREATININE 1.08 (H) 10/24/2021 0156  ? CALCIUM 8.4 (L) 10/24/2021 0156  ? GFRNONAA 56 (L) 10/24/2021 0156  ? GFRAA >60 12/28/2019 1159  ? ? ? ?Intake/Output Summary (Last 24 hours) at 10/27/2021 0650 ?Last data filed at 10/26/2021 1300 ?Gross per 24 hour  ?Intake 480 ml  ?Output --  ?Net 480 ml  ? ? ? Assessment/Plan:  This is a 71 y.o. female who is s/p right TCAR 4 Days Post-Op ? ?-pt is doing well this am.  Post op headaches improved.  Of note, she was having these headaches pre-op and they are improved from.   ?-pt neuro exam is at  baseline with right sided weakness ?-pt has ambulated in the room but not in the hallways. ?-she has had some difficulty swallowing since surgery.  May need swallow study prior to discharge.   Will defer to Dr. Stanford Breed. ?-f/u with [facility] in 4 weeks carotid duplex ? ? ?Leontine Locket, PA-C ?[facility] ?[phone] ? ?VASCULAR STAFF ADDENDUM: ?I have independently interviewed and examined the patient. ?I agree with the above.  ?Mild headache, which is a chronic problem for her. ?Able to eat and drink. Some mild discomfort with swallowing likely to resolve. No need for formal barium swallow.  ?Safe for discharge home today. ?Follow up with me in 4 weeks with carotid duplex. ? ?Yevonne Aline. Stanford Breed, MD ?[facility] [place] ?Office Phone Number: [phone] ?10/27/2021 12:14 PM ? ? ?

## 2021-10-27 NOTE — Progress Notes (Signed)
Mobility Specialist Progress Note ? ? 10/27/21 1517  ?Mobility  ?Activity Ambulated with assistance in hallway  ?Level of Assistance Contact guard assist, steadying assist  ?Assistive Device Front wheel walker  ?Distance Ambulated (ft) 148 ft  ?Activity Response Tolerated well  ?$Mobility charge 1 Mobility  ? ?Pt c/o having a headache throughout session but ambulates well w/ no physical assistance required, contact guard for safety. Returned to EOB w/ call bell in reach and RN notified.   ? ?Holland Falling ?Mobility Specialist ?Phone Number [phone] ? ?

## 2021-10-27 NOTE — Progress Notes (Signed)
Pt being d/c, IV removed, tele returned, education complete.  ? ?Chrisandra Carota, RN ?10/27/2021 ? ?

## 2021-10-27 NOTE — Progress Notes (Signed)
Inpatient Diabetes Program Recommendations ? ?AACE/ADA: New Consensus Statement on Inpatient Glycemic Control  ? ?Target Ranges:  Prepandial:   less than 140 mg/dL ?     Peak postprandial:   less than 180 mg/dL (1-2 hours) ?     Critically ill patients:  140 - 180 mg/dL  ? ? Latest Reference Range & Units 10/26/21 06:22 10/26/21 13:06 10/26/21 16:27 10/26/21 21:17 10/27/21 06:39  ?Glucose-Capillary 70 - 99 mg/dL 109 (H) 247 (H) 345 (H) 194 (H) 145 (H)  ? ?Review of Glycemic Control ? ?Diabetes history: DM2 ?Outpatient Diabetes medications: Jardiance 25 mg daily, Bydureon 2 mg Qweek (Monday), OmniPod with Humalog ?Current orders for Inpatient glycemic control: Semglee 20 units daily, Novolog 0-15 units AC&HS, Novolog 4 units TID with meals, Jardiance 25 mg daily, Prednisone 10 mg daily ? ?Inpatient Diabetes Program Recommendations:   ? ?Insulin: If steroids are continued and patient remains inpatient today, please consider increasing meal coverage to Novolog 7 units TID with meals if patient eats at least 50% of meals. ? ?Thanks, ?Barnie Alderman, RN, MSN, CDE ?Diabetes Coordinator ?Inpatient Diabetes Program ?[phone] (Team Pager from 8am to 5pm) ? ? ? ?

## 2021-10-29 ENCOUNTER — Other Ambulatory Visit: Payer: Self-pay | Admitting: Physician Assistant

## 2021-10-29 DIAGNOSIS — E1165 Type 2 diabetes mellitus with hyperglycemia: Secondary | ICD-10-CM | POA: Diagnosis not present

## 2021-10-29 DIAGNOSIS — I10 Essential (primary) hypertension: Secondary | ICD-10-CM | POA: Diagnosis not present

## 2021-10-29 DIAGNOSIS — Z9641 Presence of insulin pump (external) (internal): Secondary | ICD-10-CM | POA: Diagnosis not present

## 2021-10-29 DIAGNOSIS — E890 Postprocedural hypothyroidism: Secondary | ICD-10-CM | POA: Diagnosis not present

## 2021-10-29 MED ORDER — CLOPIDOGREL BISULFATE 75 MG PO TABS: 75.0000 mg | ORAL_TABLET | Freq: Every day | ORAL | 0 refills | Status: DC

## 2021-10-30 ENCOUNTER — Telehealth: Payer: Self-pay | Admitting: *Deleted

## 2021-10-30 NOTE — Telephone Encounter (Signed)
Patient called stating she was having difficulty sleeping.  I explained that Dr does not prescribe sleep aides.  I suggested she call her PCP.  Also some preventive things that she may try are, no caffeine in the afternoon, no computer use close to bedtime and no heavy meal at bedtime.  Patient voiced understanding of the instructions. ?

## 2021-11-05 DIAGNOSIS — E1165 Type 2 diabetes mellitus with hyperglycemia: Secondary | ICD-10-CM | POA: Diagnosis not present

## 2021-11-05 DIAGNOSIS — Z794 Long term (current) use of insulin: Secondary | ICD-10-CM | POA: Diagnosis not present

## 2021-11-12 DIAGNOSIS — E119 Type 2 diabetes mellitus without complications: Secondary | ICD-10-CM | POA: Diagnosis not present

## 2021-11-12 DIAGNOSIS — Z794 Long term (current) use of insulin: Secondary | ICD-10-CM | POA: Diagnosis not present

## 2021-11-14 DIAGNOSIS — R197 Diarrhea, unspecified: Secondary | ICD-10-CM | POA: Diagnosis not present

## 2021-11-14 DIAGNOSIS — H469 Unspecified optic neuritis: Secondary | ICD-10-CM | POA: Diagnosis not present

## 2021-11-14 DIAGNOSIS — H3561 Retinal hemorrhage, right eye: Secondary | ICD-10-CM | POA: Diagnosis not present

## 2021-11-14 DIAGNOSIS — Z79899 Other long term (current) drug therapy: Secondary | ICD-10-CM | POA: Diagnosis not present

## 2021-11-14 DIAGNOSIS — E113293 Type 2 diabetes mellitus with mild nonproliferative diabetic retinopathy without macular edema, bilateral: Secondary | ICD-10-CM | POA: Diagnosis not present

## 2021-11-14 DIAGNOSIS — H3500 Unspecified background retinopathy: Secondary | ICD-10-CM | POA: Diagnosis not present

## 2021-11-21 ENCOUNTER — Other Ambulatory Visit: Payer: Self-pay | Admitting: Neurology

## 2021-11-25 ENCOUNTER — Other Ambulatory Visit: Payer: Self-pay | Admitting: Physician Assistant

## 2021-11-25 ENCOUNTER — Other Ambulatory Visit: Payer: Self-pay

## 2021-11-25 DIAGNOSIS — I6523 Occlusion and stenosis of bilateral carotid arteries: Secondary | ICD-10-CM

## 2021-12-06 DIAGNOSIS — E1165 Type 2 diabetes mellitus with hyperglycemia: Secondary | ICD-10-CM | POA: Diagnosis not present

## 2021-12-06 DIAGNOSIS — Z794 Long term (current) use of insulin: Secondary | ICD-10-CM | POA: Diagnosis not present

## 2021-12-09 ENCOUNTER — Ambulatory Visit (HOSPITAL_COMMUNITY)
Admission: RE | Admit: 2021-12-09 | Discharge: 2021-12-09 | Disposition: A | Discharge status: Home / Self Care | Payer: Medicare Other | Source: Ambulatory Visit | Attending: Vascular Surgery | Admitting: Vascular Surgery

## 2021-12-09 ENCOUNTER — Ambulatory Visit (INDEPENDENT_AMBULATORY_CARE_PROVIDER_SITE_OTHER): Payer: Medicare Other | Admitting: Vascular Surgery

## 2021-12-09 ENCOUNTER — Encounter: Payer: Self-pay | Admitting: Vascular Surgery

## 2021-12-09 VITALS — BP 154/82 | HR 89 | Temp 98.6°F | Resp 20 | Ht 63.0 in | Wt 188.0 lb

## 2021-12-09 DIAGNOSIS — I6523 Occlusion and stenosis of bilateral carotid arteries: Secondary | ICD-10-CM

## 2021-12-09 NOTE — Progress Notes (Signed)
[facility] [place] ? ?ASSESSMENT / PLAN: ?71 y.o. female with status post left TCAR 09/24/2021 for symptomatic left carotid artery stenosis.  Status post right TCAR for asymptomatic critical carotid artery stenosis 10/23/21. OK to discontinue plavix today. Continue ASA '81mg'$  PO QD indefinitely. Continue high intensity statin therapy indefinitely. Follow up with me in 1 year for continued surveillance of carotid stenting.  ? ?CHIEF COMPLAINT: follow up carotid stenting. ? ?HISTORY OF PRESENT ILLNESS: ?[name] is a 71 y.o. female well-known to me for whom I performed a left transcarotid revascularization on 09/24/2021 for symptomatic left carotid artery stenosis.  She tolerated this very well.  She had no focal neurologic deficits postoperatively.  She returns to clinic today.  She is doing very well.  Her right arm weakness continues to improve.  She is recovering well from her surgery.  She has healed her neck incision.  We reviewed her duplex findings today.  We had a long talk about the natural history of asymptomatic carotid artery stenosis. ? ?12/09/21: Doing well postoperatively.  The patient reports occasional occipital headache and numbness in the right fourth and fifth digits.  No other neurologic symptoms. ? ?Past Medical History:  ?Diagnosis Date  ? Arthritis   ? "all over my body" (03/13/2013)  ? Asthma   ? Asymptomatic carotid artery stenosis, bilateral 10/08/2018  ? GERD (gastroesophageal reflux disease)   ? H/O hiatal hernia   ? Headache   ? pt states she has had headaches for about 6 months "off and on"  ? Heart murmur   ? pt had echocardiogram on 09/17/21  ? Hypercholesterolemia 10/08/2018  ? Hypertension   ? Hypothyroidism   ? Myasthenia gravis ([place])   ? "in my eyes; diagnsosed > 7 yr ago" (03/13/2013)  ? Sleep apnea   ? on CPAP  ? Stroke [facility]) 2021  ? Thyroid carcinoma ([place])   ? Type II diabetes mellitus ([place])   ? ? ?Past Surgical History:  ?Procedure Laterality Date  ?  ABDOMINAL HYSTERECTOMY    ? ANTERIOR CERVICAL DECOMP/DISCECTOMY FUSION    ? "I've had severa ORs; always went in from the front" (03/13/2013)  ? APPENDECTOMY    ? CARDIAC CATHETERIZATION    ? "several" (03/13/2013)  ? CARPAL TUNNEL RELEASE Right   ? CATARACT EXTRACTION W/ INTRAOCULAR LENS  IMPLANT, BILATERAL Bilateral   ? CHOLECYSTECTOMY    ? KNEE ARTHROSCOPY Left   ? SHOULDER ARTHROSCOPY W/ ROTATOR CUFF REPAIR Left   ? TONSILLECTOMY    ? TOTAL THYROIDECTOMY    ? TRANSCAROTID ARTERY REVASCULARIZATION?  Left 09/24/2021  ? Procedure: LEFT TRANSCAROTID ARTERY REVASCULARIZATION;  Surgeon: Cherre Robins, MD;  Location: [place];  Service: Vascular;  Laterality: Left;  ? TRANSCAROTID ARTERY REVASCULARIZATION?  Right 10/23/2021  ? Procedure: Right Transcarotid Artery Revascularization;  Surgeon: Cherre Robins, MD;  Location: [facility] OR;  Service: Vascular;  Laterality: Right;  ? ULTRASOUND GUIDANCE FOR VASCULAR ACCESS Right 09/24/2021  ? Procedure: ULTRASOUND GUIDANCE FOR VASCULAR ACCESS;  Surgeon: Cherre Robins, MD;  Location: [facility] OR;  Service: Vascular;  Laterality: Right;  ? ULTRASOUND GUIDANCE FOR VASCULAR ACCESS Right 10/23/2021  ? Procedure: ULTRASOUND GUIDANCE FOR VASCULAR ACCESS, LEFT FEMORAL VEIN;  Surgeon: Cherre Robins, MD;  Location: [place];  Service: Vascular;  Laterality: Right;  ? ? ?Family History  ?Problem Relation Age of Onset  ? Coronary artery disease Sister   ?     s/p coronary stenting  ? Stroke  Sister 1  ? Diabetes Sister   ? Congestive Heart Failure Mother   ? Asthma Mother   ? Diabetes Mother   ? Congestive Heart Failure Father   ? Lung cancer Father   ? Asthma Father   ? Diabetes Father   ? Diabetes Brother   ? ? ?Social History  ? ?Socioeconomic History  ? Marital status: Single  ?  Spouse name: Not on file  ? Number of children: 0  ? Years of education: college  ? Highest education level: Not on file  ?Occupational History  ? Occupation: retired  ?Tobacco Use  ? Smoking status: Never  ? Smokeless  tobacco: Never  ?Vaping Use  ? Vaping Use: Never used  ?Substance and Sexual Activity  ? Alcohol use: No  ?  Alcohol/week: 0.0 standard drinks  ? Drug use: No  ? Sexual activity: Never  ?Other Topics Concern  ? Not on file  ?Social History Narrative  ? Lives alone  ? Right handed  ? Drinks no caffeine  ? ?Social Determinants of Health  ? ?Financial Resource Strain: Not on file  ?Food Insecurity: Not on file  ?Transportation Needs: Not on file  ?Physical Activity: Not on file  ?Stress: Not on file  ?Social Connections: Not on file  ?Intimate Partner Violence: Not on file  ? ? ?Allergies  ?Allergen Reactions  ? Contrast Media [Iodinated Contrast Media] Anaphylaxis  ?  01/10/15 and 09/18/2021 --PT GIVEN 13 HR PRE MEDS FOR CT with contrast TOLERATED IV CONTRAST W/O ANY REACTION  ? Fluorescein Shortness Of Breath and Other (See Comments)  ?  (Dye)  ? Iodine Anaphylaxis  ?  Contrast dye - iodine  ? Molds & Smuts Shortness Of Breath and Other (See Comments)  ?  Congestion and wheezing, also  ? Shellfish-Derived Products Anaphylaxis, Shortness Of Breath, Swelling and Other (See Comments)  ?  Welts, also  ? Azithromycin Nausea Only  ? Codeine Hives and Nausea Only  ? Metformin Hcl Er Nausea Only  ? Tramadol Hcl Nausea Only  ? Other Rash and Other (See Comments)  ?  Coban causes welts, also  ? ? ?Current Outpatient Medications  ?Medication Sig Dispense Refill  ? acetaminophen (TYLENOL) 500 MG tablet Take 500 mg by mouth every 6 (six) hours as needed (pain).    ? aspirin EC 81 MG EC tablet Take 1 tablet (81 mg total) by mouth daily.    ? B-D ULTRAFINE III SHORT PEN 31G X 8 MM MISC     ? Biotin 5000 MCG CAPS Take 5,000 mcg by mouth daily with breakfast.     ? clopidogrel (PLAVIX) 75 MG tablet TAKE 1 TABLET BY MOUTH EVERY DAY 30 tablet 0  ? cycloSPORINE (RESTASIS) 0.05 % ophthalmic emulsion Place 1 drop into both eyes 2 (two) times daily. 0.4 mL 0  ? diclofenac Sodium (VOLTAREN) 1 % GEL Apply 2 g topically 4 (four) times daily.  (Patient taking differently: Apply 1 application. topically daily as needed (pain).) 2 g 0  ? diphenhydrAMINE 25 mg in sodium chloride 0.9 % 50 mL Inject 25 mg into the vein See admin instructions. Administer 25 mg intravenously at start of Gamunex infusion, then administer 25 mg during infusion    ? Elderberry 500 MG CAPS Take 500 mg by mouth daily.    ? empagliflozin (JARDIANCE) 25 MG TABS tablet Take 25 mg by mouth daily.    ? esomeprazole (NEXIUM) 40 MG capsule Take 1 capsule (40 mg total) by  mouth 2 (two) times daily. 60 capsule 0  ? Exenatide ER (BYDUREON BCISE) 2 MG/0.85ML AUIJ Inject 2 mg into the skin every Monday.    ? ezetimibe (ZETIA) 10 MG tablet TAKE 1 TABLET BY MOUTH EVERY DAY (Patient taking differently: Take 10 mg by mouth daily.) 90 tablet 1  ? Immune Globulin, Human, 40 GM/400ML SOLN Infusions are administered at home by home health nurse - last infusion 09/08/21 and 09/09/21; next infusion due 10/19/21 and 10/20/21    ? Insulin Disposable Pump (OMNIPOD DASH 5 PACK PODS) MISC Use with Humalog    ? levothyroxine (SYNTHROID) 150 MCG tablet Take 150 mcg by mouth daily before breakfast.    ? lidocaine (LIDODERM) 5 % Place 1 patch onto the skin daily. Remove & Discard patch within 12 hours or as directed by MD (Patient taking differently: Place 1 patch onto the skin daily as needed. Remove & Discard patch within 12 hours or as directed by MD) 30 patch 0  ? loratadine (CLARITIN) 10 MG tablet Take 1 tablet (10 mg total) by mouth at bedtime. 30 tablet 0  ? meclizine (ANTIVERT) 25 MG tablet Take 25 mg by mouth 3 (three) times daily as needed for dizziness (or migraine-related nausea).     ? montelukast (SINGULAIR) 10 MG tablet Take 1 tablet (10 mg total) by mouth every morning. 30 tablet 0  ? mycophenolate (CELLCEPT) 500 MG tablet Take 1,500 mg by mouth 2 (two) times daily. For myasthenia gravis    ? olmesartan (BENICAR) 20 MG tablet Take 1 tablet (20 mg total) by mouth daily. 30 tablet 0  ? predniSONE  (DELTASONE) 10 MG tablet Take 10 mg by mouth every morning. For myasthenia gravis    ? PRESCRIPTION MEDICATION Inhale into the lungs at bedtime. CPAP    ? PROAIR HFA 108 (90 BASE) MCG/ACT inhaler Inhale 2 puffs into the

## 2021-12-12 DIAGNOSIS — E119 Type 2 diabetes mellitus without complications: Secondary | ICD-10-CM | POA: Diagnosis not present

## 2021-12-12 DIAGNOSIS — Z794 Long term (current) use of insulin: Secondary | ICD-10-CM | POA: Diagnosis not present

## 2021-12-22 DIAGNOSIS — H469 Unspecified optic neuritis: Secondary | ICD-10-CM | POA: Diagnosis not present

## 2021-12-22 DIAGNOSIS — Z79899 Other long term (current) drug therapy: Secondary | ICD-10-CM | POA: Diagnosis not present

## 2021-12-24 ENCOUNTER — Other Ambulatory Visit: Payer: Self-pay | Admitting: Vascular Surgery

## 2021-12-24 DIAGNOSIS — G7000 Myasthenia gravis without (acute) exacerbation: Secondary | ICD-10-CM | POA: Diagnosis not present

## 2021-12-24 DIAGNOSIS — H3500 Unspecified background retinopathy: Secondary | ICD-10-CM | POA: Diagnosis not present

## 2022-01-01 DIAGNOSIS — H4311 Vitreous hemorrhage, right eye: Secondary | ICD-10-CM | POA: Diagnosis not present

## 2022-01-01 DIAGNOSIS — E113312 Type 2 diabetes mellitus with moderate nonproliferative diabetic retinopathy with macular edema, left eye: Secondary | ICD-10-CM | POA: Diagnosis not present

## 2022-01-01 DIAGNOSIS — H469 Unspecified optic neuritis: Secondary | ICD-10-CM | POA: Diagnosis not present

## 2022-01-01 DIAGNOSIS — E113591 Type 2 diabetes mellitus with proliferative diabetic retinopathy without macular edema, right eye: Secondary | ICD-10-CM | POA: Diagnosis not present

## 2022-01-01 DIAGNOSIS — H3500 Unspecified background retinopathy: Secondary | ICD-10-CM | POA: Diagnosis not present

## 2022-01-01 DIAGNOSIS — G7000 Myasthenia gravis without (acute) exacerbation: Secondary | ICD-10-CM | POA: Diagnosis not present

## 2022-01-06 DIAGNOSIS — Z794 Long term (current) use of insulin: Secondary | ICD-10-CM | POA: Diagnosis not present

## 2022-01-06 DIAGNOSIS — E1165 Type 2 diabetes mellitus with hyperglycemia: Secondary | ICD-10-CM | POA: Diagnosis not present

## 2022-01-07 DIAGNOSIS — D8989 Other specified disorders involving the immune mechanism, not elsewhere classified: Secondary | ICD-10-CM | POA: Diagnosis not present

## 2022-01-07 DIAGNOSIS — H3500 Unspecified background retinopathy: Secondary | ICD-10-CM | POA: Diagnosis not present

## 2022-01-07 DIAGNOSIS — Z79899 Other long term (current) drug therapy: Secondary | ICD-10-CM | POA: Diagnosis not present

## 2022-01-11 DIAGNOSIS — Z794 Long term (current) use of insulin: Secondary | ICD-10-CM | POA: Diagnosis not present

## 2022-01-11 DIAGNOSIS — E119 Type 2 diabetes mellitus without complications: Secondary | ICD-10-CM | POA: Diagnosis not present

## 2022-01-12 ENCOUNTER — Other Ambulatory Visit: Payer: Self-pay | Admitting: Vascular Surgery

## 2022-01-27 DIAGNOSIS — H4311 Vitreous hemorrhage, right eye: Secondary | ICD-10-CM | POA: Diagnosis not present

## 2022-01-27 DIAGNOSIS — G7000 Myasthenia gravis without (acute) exacerbation: Secondary | ICD-10-CM | POA: Diagnosis not present

## 2022-01-27 DIAGNOSIS — Z7984 Long term (current) use of oral hypoglycemic drugs: Secondary | ICD-10-CM | POA: Diagnosis not present

## 2022-01-27 DIAGNOSIS — H469 Unspecified optic neuritis: Secondary | ICD-10-CM | POA: Diagnosis not present

## 2022-01-27 DIAGNOSIS — H3500 Unspecified background retinopathy: Secondary | ICD-10-CM | POA: Diagnosis not present

## 2022-01-27 DIAGNOSIS — E113312 Type 2 diabetes mellitus with moderate nonproliferative diabetic retinopathy with macular edema, left eye: Secondary | ICD-10-CM | POA: Diagnosis not present

## 2022-01-27 DIAGNOSIS — H532 Diplopia: Secondary | ICD-10-CM | POA: Diagnosis not present

## 2022-01-27 DIAGNOSIS — Z794 Long term (current) use of insulin: Secondary | ICD-10-CM | POA: Diagnosis not present

## 2022-02-04 DIAGNOSIS — G4733 Obstructive sleep apnea (adult) (pediatric): Secondary | ICD-10-CM | POA: Diagnosis not present

## 2022-02-04 DIAGNOSIS — J45909 Unspecified asthma, uncomplicated: Secondary | ICD-10-CM | POA: Diagnosis not present

## 2022-02-06 DIAGNOSIS — Z79899 Other long term (current) drug therapy: Secondary | ICD-10-CM | POA: Diagnosis not present

## 2022-02-06 DIAGNOSIS — H3561 Retinal hemorrhage, right eye: Secondary | ICD-10-CM | POA: Diagnosis not present

## 2022-02-06 DIAGNOSIS — H4311 Vitreous hemorrhage, right eye: Secondary | ICD-10-CM | POA: Diagnosis not present

## 2022-02-06 DIAGNOSIS — G7000 Myasthenia gravis without (acute) exacerbation: Secondary | ICD-10-CM | POA: Diagnosis not present

## 2022-02-06 DIAGNOSIS — H469 Unspecified optic neuritis: Secondary | ICD-10-CM | POA: Diagnosis not present

## 2022-02-06 DIAGNOSIS — R197 Diarrhea, unspecified: Secondary | ICD-10-CM | POA: Diagnosis not present

## 2022-02-06 DIAGNOSIS — E113293 Type 2 diabetes mellitus with mild nonproliferative diabetic retinopathy without macular edema, bilateral: Secondary | ICD-10-CM | POA: Diagnosis not present

## 2022-02-06 DIAGNOSIS — H3500 Unspecified background retinopathy: Secondary | ICD-10-CM | POA: Diagnosis not present

## 2022-02-11 ENCOUNTER — Encounter: Payer: Self-pay | Admitting: Adult Health

## 2022-02-11 ENCOUNTER — Ambulatory Visit (INDEPENDENT_AMBULATORY_CARE_PROVIDER_SITE_OTHER): Payer: Medicare Other | Admitting: Adult Health

## 2022-02-11 VITALS — BP 174/81 | HR 99 | Ht 63.0 in | Wt 197.0 lb

## 2022-02-11 DIAGNOSIS — Z8673 Personal history of transient ischemic attack (TIA), and cerebral infarction without residual deficits: Secondary | ICD-10-CM | POA: Diagnosis not present

## 2022-02-11 DIAGNOSIS — G43009 Migraine without aura, not intractable, without status migrainosus: Secondary | ICD-10-CM

## 2022-02-11 DIAGNOSIS — G4733 Obstructive sleep apnea (adult) (pediatric): Secondary | ICD-10-CM

## 2022-02-11 DIAGNOSIS — G7000 Myasthenia gravis without (acute) exacerbation: Secondary | ICD-10-CM | POA: Diagnosis not present

## 2022-02-11 MED ORDER — TOPIRAMATE 50 MG PO TABS: 50.0000 mg | ORAL_TABLET | Freq: Every day | ORAL | 3 refills | Status: DC

## 2022-02-11 NOTE — Patient Instructions (Signed)
Continue topamax '50mg'$  nightly for now - please call if headaches persist once vision improves and once back on CPAP  Will follow up with Dr. Brett Fairy to see if she would like to see you prior to repeat sleep study in order to obtain a new CPAP machine or if she is okay with just pursuing sleep study - we will keep you updated     Followup in the future with me in 6 months or call earlier if needed       Thank you for coming to see [place] at [facility]. I hope we have been able to provide you high quality care today.  You may receive a patient satisfaction survey over the next few weeks. We would appreciate your feedback and comments so that we may continue to improve ourselves and the health of our patients.

## 2022-02-11 NOTE — Progress Notes (Signed)
[facility] [address] [place]. [place] [address] [phone]       OFFICE FOLLOW UP VISIT NOTE  [name] Date of Birth:  [birth year] Medical Record Number:  [id number]   Referring MD: Alysia Penna  Reason for Referral: Numbness and headache  HPI:   Initial visit 10/29/2020 Dr. Phillips Climes. [name] is a 71 year old African-American lady seen today for office consultation visit.  History is obtained from the patient, review of electronic medical records as well as care everywhere and I personally reviewed pertinent imaging films in PACS.  She has past medical history of hypertension, hyperlipidemia, obesity, asthma, hypothyroidism, seronegative ocular myasthenia, sleep apnea, thyroid carcinoma, diabetes.  Patient states that she has had new onset of numbness involving the right face for the last few months.  This is intermittent and can last for several minutes to hours and can occur at variable frequency couple of times a week to once every couple of weeks.  She is also noticed for the last several weeks headaches which is sharp shooting severe lasting few seconds involving mostly the frontal and occasionally occipital regions.  These also occur to 3 times a day.  She is been taking some Tylenol which seems to help.  There is occasional sweating with these headaches but she denies any tearing of her eyes or redness accompanying the headaches.  Patient has tried taking gabapentin for the numbness which has not helped.  She is presently on 300 mg 3 times daily which was recently increased.  She has actually history of left thalamic lacunar infarct in April 2021 at that time she did have some right face paresthesias and numbness which actually resolved several months after till they recurred again recently.  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  She has been following up with Dr. Nanine Means neurologist at [facility]  and is presently on CellCept 1500 mg twice daily and prednisone 10 mg daily.  She still has some mild ptosis and intermittent diplopia but symptoms have been stable for quite some time.  She does have an appointment to see Dr. Nanine Means in June.  She denies any recurrent stroke or TIA symptoms.  She is on aspirin which is tolerating well without bruising or bleeding.  She states her sugars are now better controlled and she is current only on insulin pump and uses her insulin sensor and has an upcoming appointment at the end of the month with endocrinologist Dr. Soyla Murphy and will have lipid profile and A1c checked at that visit.  She states her blood pressure is usually runs pretty good though today it is elevated at 170/76 as she was late for the appointment and she could not find her office.  She denies any loss of vision with her recent new headaches or muscle aches or pains or jaw claudication or scalp tenderness. Update 02/18/2021 Dr. Leonie Man: She returns for follow-up after last visit 3 months ago.  Patient states she has noticed some improvement in her headaches and numbness when she takes Topamax but she is not taking it on a scheduled basis and takes it only as needed.  Symptoms still persist but not as bothersome.  She has a new complaint of pain and swelling in the left eye and she has seen an ophthalmologist with diagnosed her with a stye.  She had MRI scan of the brain done on 11/10/2020 which showed no acute abnormality and showed old remote left thalamic lacunar  infarct.  LDL cholesterol on 01/01/2021 was 133 and Dr. Einar Gip increase the dose of Crestor which she now takes at 40 mg daily.  She also had carotid ultrasound on 01/07/2021 which showed 70% right ICA and 50 to 69% left ICA stenosis.  She also is complaining of right hand pain and is wearing a carpal tunnel splint.  She has been advised surgery but she is not happy with the surgeon and is thinking about a second opinion. Update 08/06/2021 Dr. Leonie Man:  Patient returns for follow-up after last visit 6 months ago.  She is accompanied by her daughter.  Patient states her headaches are doing well and still occur intermittently off and on once every 2 to 3 weeks.  She is on Topamax 50 mg twice daily but feels that the morning dose of makes her sleepy and wonders if she needs to reduce the dose.  She is not had much episodes of numbness and occasionally has numbness involving her nose and right face which is not bothersome.  She does follow-up with her neurologist at [facility] for myasthenia and getting treated with IVIG infusion symptoms appear to be stable.  She has had no recurrent stroke or TIA symptoms.  She did have follow-up carotid ultrasound done last week in Dr. Irven Shelling office which shows stable appearance of greater than 70% right ICA and 50 to 69% left ICA stenosis.  She has no new symptoms.  She wants to drive.  Update 02/11/2022 JM: Patient returns for 12-monthfollow-up visit.  Overall stable.  Currently, experiencing 3-4 headaches per month. Tolerating lower dose of topiramate 50 mg nightly. Has been having greater difficulty with vision with persistent diplopia and floaters.  Received IVIG 6/16 and [facility]. Also reports previously use CPAP for OSA, previously followed by Dr. DBrett Fairy has not been able to use over the past 6 months as it has not been working correctly (received machine in 2015). She would like a new machine to get restarted as she did feel better with use.  Continues to follow with vascular surgery and underwent left transcarotid revascularization 2/1 and R TCAR 3/2 without complication.  Also routinely followed by PCP and cardiology.  No further concerns at this time     ROS:   14 system review of systems is positive for those listed in HPI and all other systems negative   PMH:  Past Medical History:  Diagnosis Date   Arthritis    "all over my body"  (03/13/2013)   Asthma    Asymptomatic carotid artery stenosis, bilateral 10/08/2018   GERD (gastroesophageal reflux disease)    H/O hiatal hernia    Headache    pt states she has had headaches for about 6 months "off and on"   Heart murmur    pt had echocardiogram on 09/17/21   Hypercholesterolemia 10/08/2018   Hypertension    Hypothyroidism    [place]is [place]    "in my eyes; diagnsosed > 7 yr ago" (03/13/2013)   Sleep apnea    on CPAP   [place]   [place]    Type II diabetes mellitus ([facility]     Social History:  Social History   Socioeconomic History   Marital status: Single    Spouse name: Not on file   Number of children: 0   Years of education: college   Highest education level: Not on file  Occupational History   Occupation: retired  Tobacco Use   Smoking status: Never   Smokeless tobacco: Never  Vaping Use   Vaping Use: Never used  Substance and Sexual Activity   Alcohol use: No    Alcohol/week: 0.0 standard drinks of alcohol   Drug use: No   Sexual activity: Never  Other Topics Concern   Not on file  Social History Narrative   Lives alone   Right handed   Drinks no caffeine   Social Determinants of Health   Financial Resource Strain: Not on file  Food Insecurity: Not on file  Transportation Needs: Not on file  Physical Activity: Not on file  Stress: Not on file  Social Connections: Not on file  Intimate Partner Violence: Not on file    Medications:   Current Outpatient Medications on File Prior to Visit  Medication Sig Dispense Refill   acetaminophen (TYLENOL) 500 MG tablet Take 500 mg by mouth every 6 (six) hours as needed (pain).     aspirin EC 81 MG EC tablet Take 1 tablet (81 mg total) by mouth daily.     Biotin 5000 MCG CAPS Take 5,000 mcg by mouth daily with breakfast.      cycloSPORINE (RESTASIS) 0.05 % ophthalmic emulsion Place 1 drop into both eyes 2 (two) times daily. 0.4 mL 0   diclofenac Sodium  (VOLTAREN) 1 % GEL Apply 2 g topically 4 (four) times daily. (Patient taking differently: Apply 1 application  topically daily as needed (pain).) 2 g 0   diphenhydrAMINE 25 mg in sodium chloride 0.9 % 50 mL Inject 25 mg into the vein See admin instructions. Administer 25 mg intravenously at start of Gamunex infusion, then administer 25 mg during infusion     Elderberry 500 MG CAPS Take 500 mg by mouth daily.     empagliflozin (JARDIANCE) 25 MG TABS tablet Take 25 mg by mouth daily.     esomeprazole (NEXIUM) 40 MG capsule Take 1 capsule (40 mg total) by mouth 2 (two) times daily. 60 capsule 0   Exenatide ER (BYDUREON BCISE) 2 MG/0.85ML AUIJ Inject 2 mg into the skin every Monday.     ezetimibe (ZETIA) 10 MG tablet TAKE 1 TABLET BY MOUTH EVERY DAY (Patient taking differently: Take 10 mg by mouth daily.) 90 tablet 1   Immune Globulin, Human, 40 GM/400ML SOLN Infusions are administered at home by home health nurse - last infusion 09/08/21 and 09/09/21; next infusion due 10/19/21 and 10/20/21     Insulin Disposable Pump (OMNIPOD DASH 5 PACK PODS) MISC Use with Humalog     levothyroxine (SYNTHROID) 150 MCG tablet Take 150 mcg by mouth daily before breakfast.     lidocaine (LIDODERM) 5 % Place 1 patch onto the skin daily. Remove & Discard patch within 12 hours or as directed by MD (Patient taking differently: Place 1 patch onto the skin daily as needed. Remove & Discard patch within 12 hours or as directed by MD) 30 patch 0   loratadine (CLARITIN) 10 MG tablet Take 1 tablet (10 mg total) by mouth at bedtime. 30 tablet 0   meclizine (ANTIVERT) 25 MG tablet Take 25 mg by mouth 3 (three) times daily as needed for dizziness (or migraine-related nausea).      montelukast (SINGULAIR) 10 MG tablet Take 1 tablet (10 mg total) by mouth every morning. 30 tablet 0   mycophenolate (CELLCEPT) 500 MG tablet Take 1,500 mg by mouth 2 (two) times daily. For myasthenia gravis     olmesartan (BENICAR) 20 MG tablet  Take 1 tablet  (20 mg total) by mouth daily. 30 tablet 0   predniSONE (DELTASONE) 10 MG tablet Take 10 mg by mouth every morning. For myasthenia gravis     PRESCRIPTION MEDICATION Inhale into the lungs at bedtime. CPAP     PROAIR HFA 108 (90 BASE) MCG/ACT inhaler Inhale 2 puffs into the lungs every 6 (six) hours as needed for wheezing or shortness of breath.      riTUXimab (RITUXAN) 500 MG/50ML injection Inject into the vein every 6 (six) months.     vitamin B-12 (CYANOCOBALAMIN) 1000 MCG tablet Take 1,000 mcg by mouth daily.     Vitamin D, Ergocalciferol, (DRISDOL) 50000 UNITS CAPS capsule Take 50,000 Units by mouth every Monday.      rosuvastatin (CRESTOR) 40 MG tablet Take 1 tablet (40 mg total) by mouth daily. 90 tablet 3   No current facility-administered medications on file prior to visit.    Allergies:   Allergies  Allergen Reactions   Contrast Media [Iodinated Contrast Media] Anaphylaxis    01/10/15 and 09/18/2021 --PT GIVEN 13 HR PRE MEDS FOR CT with contrast TOLERATED IV CONTRAST W/O ANY REACTION   Fluorescein Shortness Of Breath and Other (See Comments)    (Dye)   Iodine Anaphylaxis    Contrast dye - iodine   Molds & Smuts Shortness Of Breath and Other (See Comments)    Congestion and wheezing, also   Shellfish-Derived Products Anaphylaxis, Shortness Of Breath, Swelling and Other (See Comments)    Welts, also   Azithromycin Nausea Only   Codeine Hives and Nausea Only   Metformin Hcl Er Nausea Only   Tramadol Hcl Nausea Only   Other Rash and Other (See Comments)    Coban causes welts, also    Physical Exam Today's Vitals   02/11/22 1043  BP: (!) 174/81  Pulse: 99  Weight: 197 lb (89.4 kg)  Height: '5\' 3"'$  (1.6 m)   Body mass index is 34.9 kg/m.   General: Mildly obese middle-aged African-American lady seated, in no evident distress Head: head normocephalic and atraumatic.   Neck: supple with no carotid or supraclavicular bruits Cardiovascular: regular rate and rhythm, no  murmurs Musculoskeletal: no deformity.   Skin:  no rash/petichiae  Vascular:  Normal pulses all extremities  Neurologic Exam Mental Status: Awake and fully alert. Oriented to place and time. Recent and remote memory intact. Attention span, concentration and fund of knowledge appropriate. Mood and affect appropriate.  Cranial Nerves: Pupils equal, briskly reactive to light. Extraocular movements full without nystagmus but complains of persistent diplopia with eye movement.  Bilateral resting ptosis.  Impaired vision bilaterally.  Hearing intact. Facial sensation intact. Face, tongue, palate moves normally and symmetrically.  Motor: Normal bulk and tone. Normal strength in all tested extremity muscles.  Mild fine action tremor of outstretched upper extremities right greater than left. Sensory.:  Subjective diminished right hemibody touch , pinprick , position and vibratory sensation.  With splitting of the midline to touch and forehead to vibration. Coordination: Rapid alternating movements normal in all extremities. Finger-to-nose and heel-to-shin performed accurately bilaterally. Gait and Station: Arises from chair without difficulty.  Uses a walker.  Stance is slightly broad-based. Gait is cautious but with reasonable balance with walker.. Able to heel, toe and tandem walk with moderate t difficulty.  Reflexes: 1+ and symmetric. Toes downgoing.        ASSESSMENT/PLAN: 71 year old African-American lady with  right face paresthesias as well as intermittent headaches which have shown good response  to Topamax but she is having some trouble tolerating twice daily dose..  Remote history of left thalamic lacunar infarct from small vessel disease.  Vascular risk factors of diabetes, hypertension, hyperlipidemia, obesity, sleep apnea and carotid stenosis s/p L TCAR 09/2021 and R TCAR 10/2021.  She also has seronegative ocular myasthenia which appears stable and well controlled on current medication regimen.      -Continue topiramate 50 mg nightly  -refill provided -Advised to call with any worsening headaches or difficulty tolerating topiramate -will f/u with Dr. Brett Fairy regarding repeat sleep study and obtaining new CPAP machine - she has not been seen by Dr. Brett Fairy since 2018 - unsure if a new visit is needed prior to pursuing testing -Continue close PCP follow-up for aggressive stroke risk factor management -Continue to follow with [place] neurology for ocular myasthenia -Continue to follow with vascular surgery for carotid stenosis   Follow-up in 6 months or call earlier if needed    CC:  [name], DO   I spent 26 minutes of face-to-face and non-face-to-face time with patient.  This included previsit chart review, lab review, study review, order entry, electronic health record documentation, patient education and discussion regarding history of headaches and current medication use, history of OSA, ocular myasthenia and present concerns and answered all other questions to patient satisfaction  Frann Rider, [facility]  [facility] [address] [place], [place] [address]  Phone [phone] Fax [phone] Note: This document was prepared with digital dictation and possible smart phrase technology. Any transcriptional errors that result from this process are unintentional.

## 2022-02-19 ENCOUNTER — Telehealth: Payer: Self-pay | Admitting: Adult Health

## 2022-02-19 DIAGNOSIS — G4733 Obstructive sleep apnea (adult) (pediatric): Secondary | ICD-10-CM

## 2022-02-19 NOTE — Telephone Encounter (Signed)
Pt states during her last visit with Jessica,NP she mentioned to her issues that she is having with her CPAP and how she feels it may need replacing.  Pt states Janett Billow, NP told her she would speak with Dr Brett Fairy and have her called back as to what would need to be done.  Pt has called today because she has not heard from anyone, please call

## 2022-03-01 NOTE — Telephone Encounter (Signed)
Note was sent to Dr. Brett Fairy on 6/21 after our visit for further input.   Dr. Brett Fairy, please see note from that date and let me know your thoughts. Thank you.

## 2022-03-01 NOTE — Telephone Encounter (Signed)
Please disregard prior message. Received response while out of office (telephone note was seen first prior to chart response).   Per Dr. Brett Fairy, "Please repeat HST with new Epworth and Fatigue  S Score, neck size and BMI. How long has she been without CPAP?"  She was seen on 6/21 for migraine headaches followed by Dr. Leonie Man. CPAP use had not previously been discussed during office visits. She was previously seen by Dr. Brett Fairy for apnea and received machine in 2015. Per prior OV for apnea was in 01/2017. She had not used machine in the past 6 months. If the above information is needed prior to proceeding with HST, would request she be scheduled with Dr. Brett Fairy to re-establish care for apnea management

## 2022-03-02 NOTE — Telephone Encounter (Signed)
Order placed as requested.  Thank you. 

## 2022-03-02 NOTE — Addendum Note (Signed)
Addended by: Frann Rider L on: 03/02/2022 04:54 PM   Modules accepted: Orders

## 2022-03-04 DIAGNOSIS — G7000 Myasthenia gravis without (acute) exacerbation: Secondary | ICD-10-CM | POA: Diagnosis not present

## 2022-03-09 NOTE — Telephone Encounter (Signed)
Pt needs to see Dr Brett Fairy

## 2022-03-09 NOTE — Telephone Encounter (Signed)
Pt is calling and requesting a nurse give her a call about getting new CPAP machine. Pt said she is having bad headaches and not sleeping at night because she's not able to wear her CPAP machine.

## 2022-03-27 DIAGNOSIS — E113311 Type 2 diabetes mellitus with moderate nonproliferative diabetic retinopathy with macular edema, right eye: Secondary | ICD-10-CM | POA: Diagnosis not present

## 2022-03-27 DIAGNOSIS — G7000 Myasthenia gravis without (acute) exacerbation: Secondary | ICD-10-CM | POA: Diagnosis not present

## 2022-03-27 DIAGNOSIS — H40023 Open angle with borderline findings, high risk, bilateral: Secondary | ICD-10-CM | POA: Diagnosis not present

## 2022-03-27 DIAGNOSIS — E113392 Type 2 diabetes mellitus with moderate nonproliferative diabetic retinopathy without macular edema, left eye: Secondary | ICD-10-CM | POA: Diagnosis not present

## 2022-03-27 DIAGNOSIS — H5213 Myopia, bilateral: Secondary | ICD-10-CM | POA: Diagnosis not present

## 2022-04-09 ENCOUNTER — Ambulatory Visit (INDEPENDENT_AMBULATORY_CARE_PROVIDER_SITE_OTHER): Payer: Medicare Other | Admitting: Neurology

## 2022-04-09 ENCOUNTER — Encounter: Payer: Self-pay | Admitting: Neurology

## 2022-04-09 VITALS — BP 124/68 | HR 94 | Ht 63.0 in | Wt 195.0 lb

## 2022-04-09 DIAGNOSIS — G4733 Obstructive sleep apnea (adult) (pediatric): Secondary | ICD-10-CM

## 2022-04-09 DIAGNOSIS — G43009 Migraine without aura, not intractable, without status migrainosus: Secondary | ICD-10-CM

## 2022-04-09 DIAGNOSIS — R269 Unspecified abnormalities of gait and mobility: Secondary | ICD-10-CM | POA: Diagnosis not present

## 2022-04-09 DIAGNOSIS — G441 Vascular headache, not elsewhere classified: Secondary | ICD-10-CM | POA: Diagnosis not present

## 2022-04-09 DIAGNOSIS — E1140 Type 2 diabetes mellitus with diabetic neuropathy, unspecified: Secondary | ICD-10-CM | POA: Diagnosis not present

## 2022-04-09 NOTE — Patient Instructions (Signed)
Sleep Apnea Sleep apnea is a condition in which breathing pauses or becomes shallow during sleep. People with sleep apnea usually snore loudly. They may have times when they gasp and stop breathing for 10 seconds or more during sleep. This may happen many times during the night. Sleep apnea disrupts your sleep and keeps your body from getting the rest that it needs. This condition can increase your risk of certain health problems, including: Heart attack. Stroke. Obesity. Type 2 diabetes. Heart failure. Irregular heartbeat. High blood pressure. The goal of treatment is to help you breathe normally again. What are the causes?  The most common cause of sleep apnea is a collapsed or blocked airway. There are three kinds of sleep apnea: Obstructive sleep apnea. This kind is caused by a blocked or collapsed airway. Central sleep apnea. This kind happens when the part of the brain that controls breathing does not send the correct signals to the muscles that control breathing. Mixed sleep apnea. This is a combination of obstructive and central sleep apnea. What increases the risk? You are more likely to develop this condition if you: Are overweight. Smoke. Have a smaller than normal airway. Are older. Are female. Drink alcohol. Take sedatives or tranquilizers. Have a family history of sleep apnea. Have a tongue or tonsils that are larger than normal. What are the signs or symptoms? Symptoms of this condition include: Trouble staying asleep. Loud snoring. Morning headaches. Waking up gasping. Dry mouth or sore throat in the morning. Daytime sleepiness and tiredness. If you have daytime fatigue because of sleep apnea, you may be more likely to have: Trouble concentrating. Forgetfulness. Irritability or mood swings. Personality changes. Feelings of depression. Sexual dysfunction. This may include loss of interest if you are female, or erectile dysfunction if you are female. How is this  diagnosed? This condition may be diagnosed with: A medical history. A physical exam. A series of tests that are done while you are sleeping (sleep study). These tests are usually done in a sleep lab, but they may also be done at home. How is this treated? Treatment for this condition aims to restore normal breathing and to ease symptoms during sleep. It may involve managing health issues that can affect breathing, such as high blood pressure or obesity. Treatment may include: Sleeping on your side. Using a decongestant if you have nasal congestion. Avoiding the use of depressants, including alcohol, sedatives, and narcotics. Losing weight if you are overweight. Making changes to your diet. Quitting smoking. Using a device to open your airway while you sleep, such as: An oral appliance. This is a custom-made mouthpiece that shifts your lower jaw forward. A continuous positive airway pressure (CPAP) device. This device blows air through a mask when you breathe out (exhale). A nasal expiratory positive airway pressure (EPAP) device. This device has valves that you put into each nostril. A bi-level positive airway pressure (BIPAP) device. This device blows air through a mask when you breathe in (inhale) and breathe out (exhale). Having surgery if other treatments do not work. During surgery, excess tissue is removed to create a wider airway. Follow these instructions at home: Lifestyle Make any lifestyle changes that your health care provider recommends. Eat a healthy, well-balanced diet. Take steps to lose weight if you are overweight. Avoid using depressants, including alcohol, sedatives, and narcotics. Do not use any products that contain nicotine or tobacco. These products include cigarettes, chewing tobacco, and vaping devices, such as e-cigarettes. If you need help quitting, ask   your health care provider. General instructions Take over-the-counter and prescription medicines only as told  by your health care provider. If you were given a device to open your airway while you sleep, use it only as told by your health care provider. If you are having surgery, make sure to tell your health care provider you have sleep apnea. You may need to bring your device with you. Keep all follow-up visits. This is important. Contact a health care provider if: The device that you received to open your airway during sleep is uncomfortable or does not seem to be working. Your symptoms do not improve. Your symptoms get worse. Get help right away if: You develop: Chest pain. Shortness of breath. Discomfort in your back, arms, or stomach. You have: Trouble speaking. Weakness on one side of your body. Drooping in your face. These symptoms may represent a serious problem that is an emergency. Do not wait to see if the symptoms will go away. Get medical help right away. Call your local emergency services (911 in the [place] Do not drive yourself to the hospital. Summary Sleep apnea is a condition in which breathing pauses or becomes shallow during sleep. The most common cause is a collapsed or blocked airway. The goal of treatment is to restore normal breathing and to ease symptoms during sleep. This information is not intended to replace advice given to you by your health care provider. Make sure you discuss any questions you have with your health care provider. Document Revised: 03/19/2021 Document Reviewed: 07/19/2020 Elsevier Patient Education  2023 [employer]  

## 2022-04-09 NOTE — Progress Notes (Signed)
[name] Seat, MD   SLEEP MEDICINE CLINIC   PATIENT: [name] DOB: [birth year]  REASON FOR VISIT: needing help with CPAP HISTORY FROM: patient, referred by [name] Rider, NP. Primary Neurologist is Dr. Leonie [name].   HISTORY OF PRESENT ILLNESS:   71 y.o. year old female  has a past medical history of Arthritis, Asthma, Asymptomatic carotid artery stenosis, bilateral (10/08/2018), GERD (gastroesophageal reflux disease), H/O hiatal hernia, Headache, Heart murmur, Hypercholesterolemia (10/08/2018), Hypertension, Hypothyroidism, Myasthenia gravis ([place]), OSA-Sleep apnea, CPAP non compliant  and dismissed at the time 2018- Stroke [facility]) (2021/ 2023), Thyroid carcinoma ([place]), and Type II diabetes mellitus ([place]). here with: New Stroke ( primary neurologist is now Dr [name] [name] Rider, NP) , and CPAP dependent. She was insomnic when first seen and complaint of migraines.  She was diagnosed with Myasthenia gravis, now follows Dr [name]- [facility] - last seen 03-04-2022.  Newly diagnosed carotid artery blockage. Left carotid artery stenosis was operated first , on 09-24-2021, right side on March 2023.  Dr [name] Breed, MD.  Dr [name], cardiology. Hse has Diabetes ( Dr [name] ) and has retinal disease, vasoproliferative (Dr [name] Likens, MD).   She has suffered several strokes since 2021, last in 1/ 2023.  She was hospitalized and slept well in hospital when she used CPAP, but machine at home is cutting off, over 56 years old.     [name]:[id number] DOB: 1950-09-30 DOA: 09/16/2021   PCP: [name] Morning, DO  Patient coming from: Home via [place] ED   I have personally briefly reviewed patient's old medical records in [place]   Chief Complaint: Right facial numbness   HPI: [name] is a 71 y.o. 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  Patient states that she woke from sleep night prior to admission with a posterior headache.  Afterwards she noticed numbness in her right face.  Numbness has persisted also involving her right arm.  She has also had some mild weakness in her right arm.  She reports continued headache.  She has not had any appreciable change in vision but states that her vision is poor at baseline and has intermittent blurred vision and diplopia.  She reports recent substernal chest discomfort which has been constant.  She states she normally ambulates with the use of a walker.  She says her current symptoms are similar to when she had a prior stroke in 2021.  Patient also reports recent diarrhea.  Her neurologist at [facility] advised her to hold her CellCept for 10 days.  She says her diarrhea has since resolved after holding CellCept.   [facility] ED Course:  Initial vitals showed BP 173/70, pulse 95, RR 18, temp 98.2 F, SPO2 100% on room air.   Labs show sodium 136, potassium 3.0, bicarb 24, BUN 14, creatinine 1.10, serum glucose 239, WBC 4.3, hemoglobin 12.4, platelets 224,000, troponin I >8.   SARS-CoV-2 and influenza PCR negative.   2 view chest x-ray negative for focal consolidation, edema, effusion.   CT and MRI brain negative for acute intracranial findings.   EDP discussed with on-call neurology who recommended medical admission for further stroke work-up.  CTA head/neck not pursued due to  report of severe contrast allergy.  The hospitalist service was consulted to admit for further evaluation and management.     HISTORY 09/19/14 ([name]): [name] is a 71 y.o.retired, afro-american, right handed female , who is seen here today in a revisit;  She is a former patient of Dr [name]., and her PA had  referred her for a sleep evaluation ( [facility]).  The patient describes that she has insomnia problems she falls asleep but she cannot stay asleep. She will sleep for 2-3  hours and then will wake up. She says that she has great trouble to reinitiate sleep and often these 3 hours at night may be all she gets. Sometimes she wakes from pain related to her diabetic peripheral neuropathy it seems to be mostly in her left foot and left anterior thigh. She states that her insomnia seems to have going on for several months now, likely about a year. The patient had an inconclusive sleep study a year ago at the [facility] had no advanced knowlegde  nor record of this.  She did not sleep in the lab,  she reports .  She was told the study will need to be repeated. Sleep habits. 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    The patient usually goes to bed around 11:00 but she reports that it will take her up to 2 hours to fall asleep before she then sleeps for only 3 hours or so. She has nocturia related to diabetes which has further fragmented sleep. She sleeps alone but family and get stronger holes have reported that she snores loudly. She sleeps in a cool, prior to and dark bedroom but she can't is comfortable. She does not watch TV in bed. She spontaneously wakes up at 7 in the [name] traditionally for many years and she rises at 7:30. She does not drink coffee or caffeinated beverages. She regularly eats breakfast. Besides the diabetic neuropathy she also has eye disease and ptosis, diplopia  related to myasthenia gravis. She no longer drives at night time because of her vision impairment.   The patient has  thyroid cancer,  arthritis, asthma,  corneal abrasion, GERD, diabetes, high cholesterol and a history of thyroid cancer. She is followed for Myasthenia gravis by Dr. Nanine [name] at [facility].   Interval history; 07-27-14. [name] is seen here today to discuss the results of her sleep study. We found very mild sleep apnea mainly consistent of hypoxemia, or shallow breathing spells. There was a clear accentuation in REM sleep which further indicates that and lower muscle tone is responsible for the apnea. Oxygen desaturation at nadir was 75% but the total time of desaturation was even more concerning 135.4 minutes in toto of the sleep time recorded. She did not have CO2 retention her No graft measured 42.2 or during REM. There were no periodic limb movements. Her heart rate was regular in normal sinus rhythm.My recommendations are based on the mild REM and positional dependent obstructive sleep apnea that CPAP only will address the hypoxemia if it is REM related. A dental device usually doesn't cover this but she will have some benefit by sleeping on the side and avoiding supine sleep position. Weight loss is also highly recommended. Given that the patient has a history of myasthenia gravis I am concerned that the prolonged hypoxemia at night is a affect of her neuromuscular  weakness. The patient was clearly not in some neck she slept actually for the total recording time. I believe that her high degree of fatigue is related to hypoxemia rather than apnea. To prescribe oxygen supplementation I need to refer this patient for pulmonary function tests and pulse oximetry during activity. I cannot do this in my office.Ordered Belsomra as a non respiratory supressant sleep aid. Order xanax 0.5 mg prn anxiety. D/c ambien. Ordered auto titration CPAP , will need ONO on CPAP within the first 30 days. [facility]. Referral to pulmonology,   cc to dr [name] at [facility].   Attach sleep study to both offices, [facility] and pulmonology.   09-19-14 Interval history the patient has done excellent with her CPAP compliance she has a compliance of 100% of the last 30 days 25 days over 4 hours of nightly use equaling 83% compliance. Her average daily use is 5 hours  and 21 minutes. The machine is set between 5 and 10 cm as an out told that with an EPR of 3 cm water residual AHI is 0.4 air leaks are small 91st percentile pressure is 8.6 cm water no evidence of central apnea emerging. Fatigue score is much reduced to 29 points Epworth sleepiness score to one point. She reports that her nose hurts from the CPAP use and that she has a very dry nostril. I will give her today a brochure about a CPAP gel that keeps and nostrils moist. I also will be happy to refill Belsomra as a sleep aid to be taken as needed. She continues to follow for myasthenia gravis with Dr. Nanine [name].  Interval history from 11/12/2016 ,  [name] is a 71 year old African-American female patient with a history of myasthenia gravis, followed at [facility]. She just was granted disability based on this condition. I have followed her for sleep apnea. Her CPAP machine does not show any days of use since October last year, her machine is an AutoSet that allows between 5 and 10 cm water pressure with 3 cm EPR but I have no data to review. Mrs. Vittorio assures me that she is using her CPAP. She will bring her machine to [facility] next week and sense as a new download directly from the DME. At this time it would make no sense to follow-up on her sleep unless she can prove that she is compliant. Her headaches have gotten worse since she stopped using it.  Her headaches are at the nape of the neck, feeling like a nod. Sharp shooting pains intermittently- not a sinus headache.   REVIEW OF SYSTEMS: Out of a complete 14 system review of symptoms, the patient complains only of the following symptoms, and all other reviewed systems are negative.   eye itching, light sensitivity, double vision, loss of vision, eye pain, blurred vision, cough, wheezing, shortness of breath, restless leg, apnea, daytime sleepiness, aching muscles, muscle cramps, walking difficulty, neck pain, frequent infections, bruise  easily, headache.  How likely are you to doze in the following situations: 0 = not likely, 1 = slight chance, 2 = moderate chance, 3 = high chance  Sitting and Reading? Watching Television? Sitting inactive in a public place (theater or meeting)? Lying down in the afternoon when circumstances permit? Sitting and talking to someone? Sitting quietly after lunch without alcohol? In a car, while stopped for a few minutes in traffic? As a passenger in a car for an hour without a break?  Total = 3/ 24, FSS at 11 /  63, GDS 5/ 15 .   Gait disorder,   ALLERGIES: Allergies  Allergen Reactions   Contrast Media [Iodinated Contrast Media] Anaphylaxis    01/10/15 and 09/18/2021 --PT GIVEN 13 HR PRE MEDS FOR CT with contrast TOLERATED IV CONTRAST W/O ANY REACTION   Fluorescein Shortness Of Breath and Other (See Comments)    (Dye)   Iodine Anaphylaxis    Contrast dye - iodine   Molds & Smuts Shortness Of Breath and Other (See Comments)    Congestion and wheezing, also   Shellfish-Derived Products Anaphylaxis, Shortness Of Breath, Swelling and Other (See Comments)    Welts, also   Azithromycin Nausea Only   Codeine Hives and Nausea Only   Metformin Hcl Er Nausea Only   Tramadol Hcl Nausea Only   Other Rash and Other (See Comments)    Coban causes welts, also    HOME MEDICATIONS: Outpatient Medications Prior to Visit  Medication Sig Dispense Refill   acetaminophen (TYLENOL) 500 MG tablet Take 500 mg by mouth every 6 (six) hours as needed (pain).     aspirin EC 81 MG EC tablet Take 1 tablet (81 mg total) by mouth daily.     Biotin 5000 MCG CAPS Take 5,000 mcg by mouth daily with breakfast.      cycloSPORINE (RESTASIS) 0.05 % ophthalmic emulsion Place 1 drop into both eyes 2 (two) times daily. 0.4 mL 0   diclofenac Sodium (VOLTAREN) 1 % GEL Apply 2 g topically 4 (four) times daily. (Patient taking differently: Apply 1 application  topically daily as needed (pain).) 2 g 0   diphenhydrAMINE  25 mg in sodium chloride 0.9 % 50 mL Inject 25 mg into the vein See admin instructions. Administer 25 mg intravenously at start of Gamunex infusion, then administer 25 mg during infusion     Elderberry 500 MG CAPS Take 500 mg by mouth daily.     empagliflozin (JARDIANCE) 25 MG TABS tablet Take 25 mg by mouth daily.     esomeprazole (NEXIUM) 40 MG capsule Take 1 capsule (40 mg total) by mouth 2 (two) times daily. 60 capsule 0   Exenatide ER (BYDUREON BCISE) 2 MG/0.85ML AUIJ Inject 2 mg into the skin every Monday.     ezetimibe (ZETIA) 10 MG tablet TAKE 1 TABLET BY MOUTH EVERY DAY (Patient taking differently: Take 10 mg by mouth daily.) 90 tablet 1   furosemide (LASIX) 40 MG tablet Take 40 mg by mouth.     IBUPROFEN PO Take by mouth.     Immune Globulin, Human, 40 GM/400ML SOLN Infusions are administered at home by home health nurse - last infusion 09/08/21 and 09/09/21; next infusion due 10/19/21 and 10/20/21     Insulin Disposable Pump (OMNIPOD DASH 5 PACK PODS) MISC Use with Humalog     levothyroxine (SYNTHROID) 150 MCG tablet Take 150 mcg by mouth daily before breakfast.     lidocaine (LIDODERM) 5 % Place 1 patch onto the skin daily. Remove & Discard patch within 12 hours or as directed by MD (Patient taking differently: Place 1 patch onto the skin daily as needed. Remove & Discard patch within 12 hours or as directed by MD) 30 patch 0   loratadine (CLARITIN) 10 MG tablet Take 1 tablet (10 mg total) by mouth at bedtime. 30 tablet 0   meclizine (ANTIVERT) 25 MG tablet Take 25 mg by mouth 3 (three) times daily as needed for dizziness (or migraine-related nausea).      montelukast (SINGULAIR) 10 MG tablet  Take 1 tablet (10 mg total) by mouth every [name]. 30 tablet 0   mycophenolate (CELLCEPT) 500 MG tablet Take 1,500 mg by mouth 2 (two) times daily. For myasthenia gravis     olmesartan (BENICAR) 20 MG tablet Take 1 tablet (20 mg total) by mouth daily. 30 tablet 0   predniSONE (DELTASONE) 10 MG tablet  Take 7.5 mg by mouth every [name]. For myasthenia gravis     PRESCRIPTION MEDICATION Inhale into the lungs at bedtime. CPAP     PROAIR HFA 108 (90 BASE) MCG/ACT inhaler Inhale 2 puffs into the lungs every 6 (six) hours as needed for wheezing or shortness of breath.      riTUXimab (RITUXAN) 500 MG/50ML injection Inject into the vein every 6 (six) months.     scopolamine (TRANSDERM-SCOP) 1 MG/3DAYS Place 1 patch onto the skin every 3 (three) days.     topiramate (TOPAMAX) 50 MG tablet Take 1 tablet (50 mg total) by mouth at bedtime. 90 tablet 3   vitamin B-12 (CYANOCOBALAMIN) 1000 MCG tablet Take 1,000 mcg by mouth daily.     Vitamin D, Ergocalciferol, (DRISDOL) 50000 UNITS CAPS capsule Take 50,000 Units by mouth every Monday.      rosuvastatin (CRESTOR) 40 MG tablet Take 1 tablet (40 mg total) by mouth daily. 90 tablet 3   No facility-administered medications prior to visit.    PAST MEDICAL HISTORY: Past Medical History:  Diagnosis Date   Arthritis    "all over my body" (03/13/2013)   Asthma    Asymptomatic carotid artery stenosis, bilateral 10/08/2018   GERD (gastroesophageal reflux disease)    H/O hiatal hernia    Headache    pt states she has had headaches for about 6 months "off and on"   Heart murmur    pt had echocardiogram on 09/17/21   Hypercholesterolemia 10/08/2018   Hypertension    Hypothyroidism    Myasthenia gravis ([place])    "in my eyes; diagnsosed > 7 yr ago" (03/13/2013)   Sleep apnea    on CPAP   Stroke ([place]) 2021   Thyroid carcinoma ([place])    Type II diabetes mellitus ([place])     PAST SURGICAL HISTORY: Past Surgical History:  Procedure Laterality Date   ABDOMINAL HYSTERECTOMY     ANTERIOR CERVICAL DECOMP/DISCECTOMY FUSION     "I've had severa ORs; always went in from the front" (03/13/2013)   [place]     "several" (03/13/2013)   CARPAL TUNNEL RELEASE Right    CATARACT EXTRACTION W/ INTRAOCULAR LENS  IMPLANT, BILATERAL Bilateral     CHOLECYSTECTOMY     KNEE ARTHROSCOPY Left    SHOULDER ARTHROSCOPY W/ ROTATOR CUFF REPAIR Left    TONSILLECTOMY     TOTAL THYROIDECTOMY     TRANSCAROTID ARTERY REVASCULARIZATION  Left 09/24/2021   Procedure: LEFT TRANSCAROTID ARTERY REVASCULARIZATION;  Surgeon: Cherre Robins, MD;  Location: [name];  Service: Vascular;  Laterality: Left;   TRANSCAROTID ARTERY REVASCULARIZATION  Right 10/23/2021   Procedure: Right Transcarotid Artery Revascularization;  Surgeon: Cherre Robins, MD;  Location: [facility] OR;  Service: Vascular;  Laterality: Right;   ULTRASOUND GUIDANCE FOR VASCULAR ACCESS Right 09/24/2021   Procedure: ULTRASOUND GUIDANCE FOR VASCULAR ACCESS;  Surgeon: Cherre Robins, MD;  Location: [facility] OR;  Service: Vascular;  Laterality: Right;   ULTRASOUND GUIDANCE FOR VASCULAR ACCESS Right 10/23/2021   Procedure: ULTRASOUND GUIDANCE FOR VASCULAR ACCESS, LEFT FEMORAL VEIN;  Surgeon: Cherre Robins, MD;  Location:  [facility] OR;  Service: Vascular;  Laterality: Right;    FAMILY HISTORY: Family History  Problem Relation Age of Onset   Coronary artery disease Sister        s/p coronary stenting   Stroke Sister 45   Diabetes Sister    Congestive Heart Failure Mother    Asthma Mother    Diabetes Mother    Congestive Heart Failure Father    Lung cancer Father    Asthma Father    Diabetes Father    Diabetes Brother     SOCIAL HISTORY: Social History   Socioeconomic History   Marital status: Single    Spouse name: Not on file   Number of children: 0   Years of education: college   Highest education level: Not on file  Occupational History   Occupation: retired  Tobacco Use   Smoking status: Never   Smokeless tobacco: Never  Vaping Use   Vaping Use: Never used  Substance and Sexual Activity   Alcohol use: No    Alcohol/week: 0.0 standard drinks of alcohol   Drug use: No   Sexual activity: Never  Other Topics Concern   Not on file  Social History Narrative   Lives alone   Right  handed   Drinks no caffeine   Social Determinants of Health   Financial Resource Strain: Not on file  Food Insecurity: Not on file  Transportation Needs: Not on file  Physical Activity: Not on file  Stress: Not on file  Social Connections: Not on file  Intimate Partner Violence: Not on file      PHYSICAL EXAM  Vitals:   04/09/22 1003  BP: 124/68  Pulse: 94  SpO2: 98%  Weight: 195 lb (88.5 kg)  Height: '5\' 3"'$  (1.6 m)   Body mass index is 34.54 kg/m.  Generalized: Well developed, in no acute distress -  Visual field restricted, blurred vision. No nystagmus, left vision wose affected, blurred.  Exophthalmos and ptosis, both eyelids cut the pupil in half.  Neck:  Circumference 15 inches, Mallampati 3+ No ankle edema.   Bilateral normal facial sensation, strength.  Shoulder and upper extremity strength is intact, normal ROM.   Walking with walker-" since first stroke". Sensory- loss of vibration and DTR in lower extremities.   DIAGNOSTIC DATA (LABS, IMAGING, TESTING) - I reviewed patient records, labs, notes, testing and imaging myself where available. Need CPAP data.   How likely are you to doze in the following situations: 0 = not likely, 1 = slight chance, 2 = moderate chance, 3 = high chance  Sitting and Reading? Watching Television? Sitting inactive in a public place (theater or meeting)? Lying down in the afternoon when circumstances permit? Sitting and talking to someone? Sitting quietly after lunch without alcohol? In a car, while stopped for a few minutes in traffic? As a passenger in a car for an hour without a break?  Total = score 3/ 24, FSS 11 out of 63, GDS 5 / 15 points.    ASSESSMENT AND PLAN 71 y.o. year old female  has a past medical history of Arthritis, Asthma, Asymptomatic carotid artery stenosis, bilateral (10/08/2018), GERD (gastroesophageal reflux disease), H/O hiatal hernia, Headache, Heart murmur, Hypercholesterolemia (10/08/2018),  Hypertension, Hypothyroidism, Myasthenia gravis ([place]), Sleep apnea, Stroke ([place]) (2021), Thyroid carcinoma ([place]), and Type II diabetes mellitus ([place]). here with:  1). Obstructive sleep apnea , CPAP dependent , to obtain good quality sleep- it appears she is non-compliant with CPAP. After using it daily  for many years: he machine stutters, switches itself on and off- end of life.   Degree of compliance recently is poor as expected. FFM.   2) Stroke follow-up. Right facial numbness and arm Numbness, never showed up on MRI brain-  now resolved, this took place in January, CEA were performed in February and early March,     3) myasthenia , followed at [facility]  4) endocrinopathies; thyroid cancer diagnosed before myasthenia. DM is poorly controlled, diabetic retinopathy. Small vessel disease. Large vessel disease.  Dr [name] follows her.     MRI neck will be followed in RV with NP in 2-3 month. If abnormal , earlier visit.    [name] Seat, MD  04/09/2022, 10:44 AM [facility] [address] [place], [place] [address] [phone]

## 2022-04-15 DIAGNOSIS — E559 Vitamin D deficiency, unspecified: Secondary | ICD-10-CM | POA: Diagnosis not present

## 2022-04-15 DIAGNOSIS — E890 Postprocedural hypothyroidism: Secondary | ICD-10-CM | POA: Diagnosis not present

## 2022-04-15 DIAGNOSIS — E7800 Pure hypercholesterolemia, unspecified: Secondary | ICD-10-CM | POA: Diagnosis not present

## 2022-04-15 DIAGNOSIS — I10 Essential (primary) hypertension: Secondary | ICD-10-CM | POA: Diagnosis not present

## 2022-04-15 DIAGNOSIS — E1165 Type 2 diabetes mellitus with hyperglycemia: Secondary | ICD-10-CM | POA: Diagnosis not present

## 2022-04-16 ENCOUNTER — Telehealth: Payer: Self-pay | Admitting: Neurology

## 2022-04-16 NOTE — Telephone Encounter (Signed)
HST- [employer] medicare no auth req  Patient is scheduled at [facility] for 04/29/22 at 10 AM.  Mailed packet to the patient.

## 2022-04-20 NOTE — Telephone Encounter (Signed)
Patient called stating she would like to do the in lab sleep study.  She is scheduled for 05/12/22 at 8 pm.  Mailed new packet out to the patient.

## 2022-04-22 DIAGNOSIS — E1165 Type 2 diabetes mellitus with hyperglycemia: Secondary | ICD-10-CM | POA: Diagnosis not present

## 2022-04-22 DIAGNOSIS — E7800 Pure hypercholesterolemia, unspecified: Secondary | ICD-10-CM | POA: Diagnosis not present

## 2022-04-22 DIAGNOSIS — I10 Essential (primary) hypertension: Secondary | ICD-10-CM | POA: Diagnosis not present

## 2022-04-22 DIAGNOSIS — E890 Postprocedural hypothyroidism: Secondary | ICD-10-CM | POA: Diagnosis not present

## 2022-04-22 DIAGNOSIS — Z9641 Presence of insulin pump (external) (internal): Secondary | ICD-10-CM | POA: Diagnosis not present

## 2022-04-22 DIAGNOSIS — E559 Vitamin D deficiency, unspecified: Secondary | ICD-10-CM | POA: Diagnosis not present

## 2022-04-24 DIAGNOSIS — H3500 Unspecified background retinopathy: Secondary | ICD-10-CM | POA: Diagnosis not present

## 2022-04-24 DIAGNOSIS — H02403 Unspecified ptosis of bilateral eyelids: Secondary | ICD-10-CM | POA: Diagnosis not present

## 2022-04-24 DIAGNOSIS — H469 Unspecified optic neuritis: Secondary | ICD-10-CM | POA: Diagnosis not present

## 2022-04-24 DIAGNOSIS — E113293 Type 2 diabetes mellitus with mild nonproliferative diabetic retinopathy without macular edema, bilateral: Secondary | ICD-10-CM | POA: Diagnosis not present

## 2022-04-24 DIAGNOSIS — H3561 Retinal hemorrhage, right eye: Secondary | ICD-10-CM | POA: Diagnosis not present

## 2022-04-24 DIAGNOSIS — Z83511 Family history of glaucoma: Secondary | ICD-10-CM | POA: Diagnosis not present

## 2022-04-24 DIAGNOSIS — H4311 Vitreous hemorrhage, right eye: Secondary | ICD-10-CM | POA: Diagnosis not present

## 2022-04-24 DIAGNOSIS — Z79899 Other long term (current) drug therapy: Secondary | ICD-10-CM | POA: Diagnosis not present

## 2022-04-24 DIAGNOSIS — R197 Diarrhea, unspecified: Secondary | ICD-10-CM | POA: Diagnosis not present

## 2022-04-24 DIAGNOSIS — G7000 Myasthenia gravis without (acute) exacerbation: Secondary | ICD-10-CM | POA: Diagnosis not present

## 2022-04-28 IMAGING — MR MR MRA HEAD W/O CM
1 series · 20 of 48 positions shown · non-contrast
Comparison: MRI head 12/20/2019

CLINICAL DATA: Left thalamic stroke

EXAM:
MRA HEAD WITHOUT CONTRAST
TECHNIQUE: Angiographic images of the Circle of Willis were obtained using MRA
technique without intravenous contrast.

[Series 3: (id) mt fs · axial · 1.4mm · 0.43mm/px · z∈[-84,+4]mm · 20 of 136 slices shown]
[im 1/136]
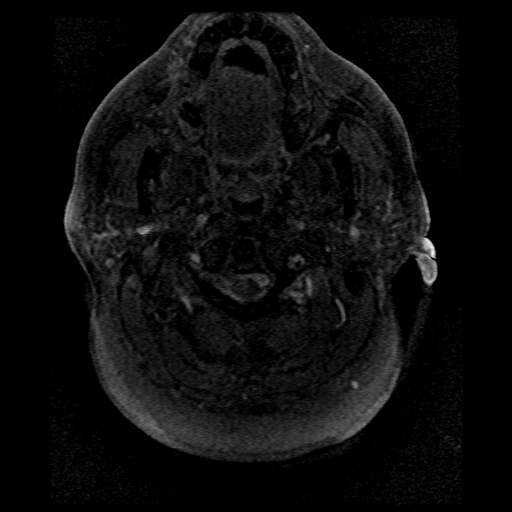
[im 3/136]
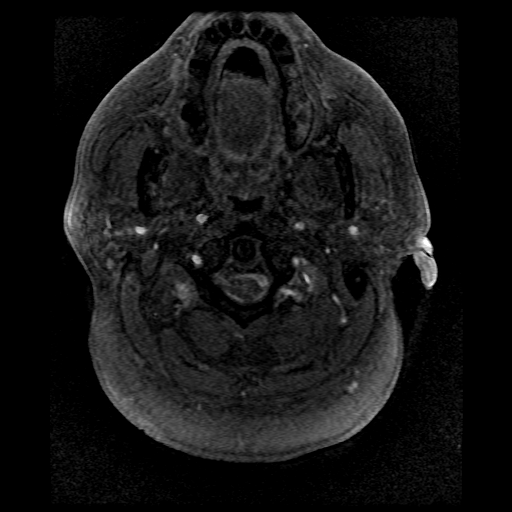
[im 6/136]
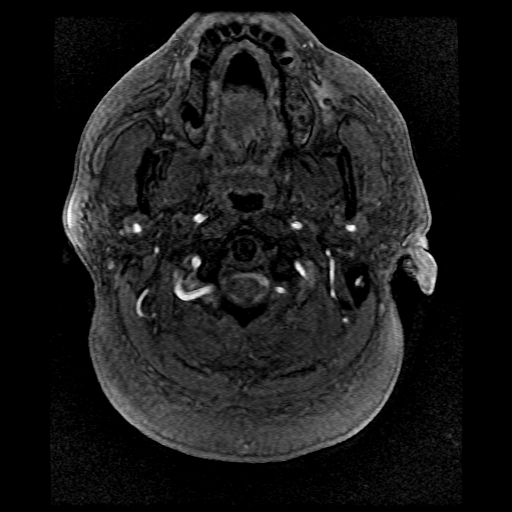
[im 9/136]
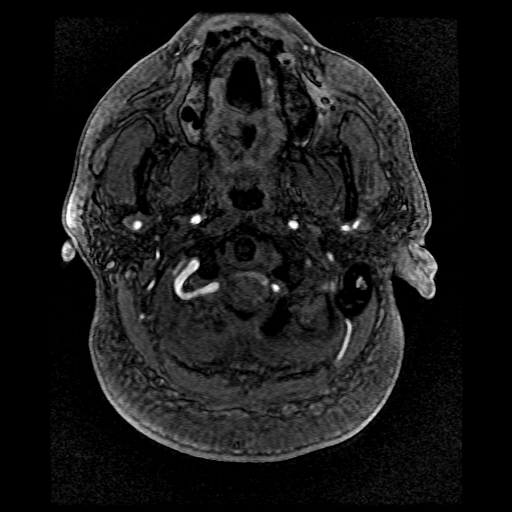
[im 12/136]
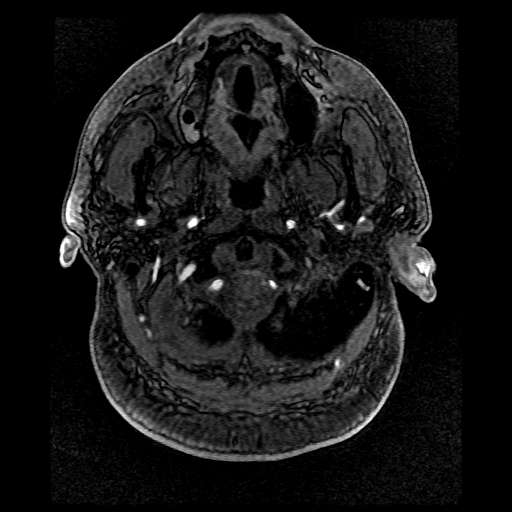
[im 15/136]
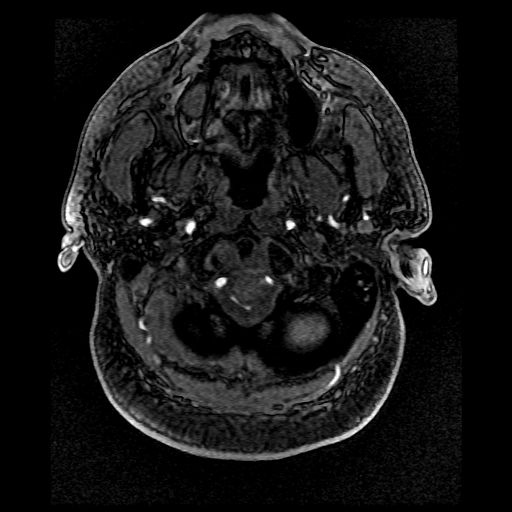
[im 18/136]
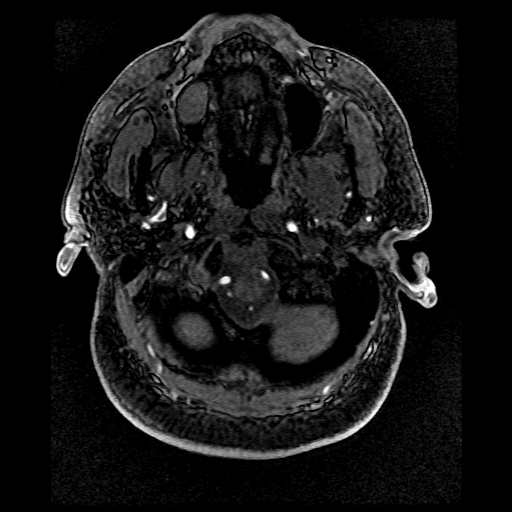
[im 21/136]
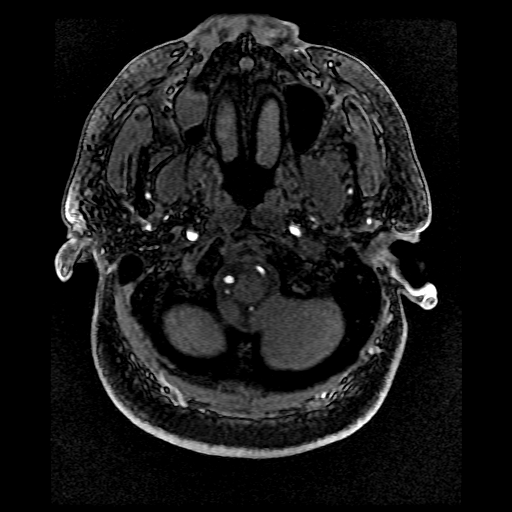
[im 23/136]
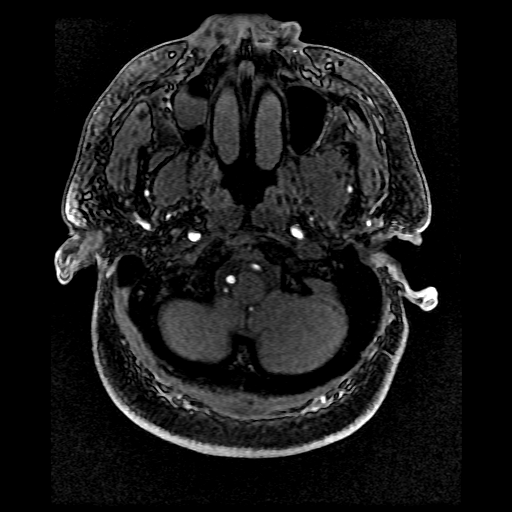
[im 26/136]
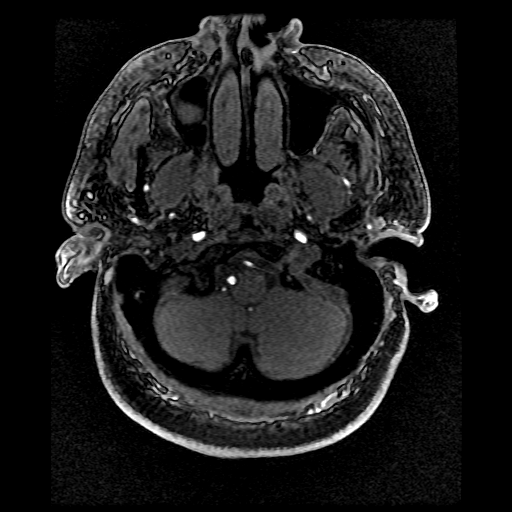
[im 29/136]
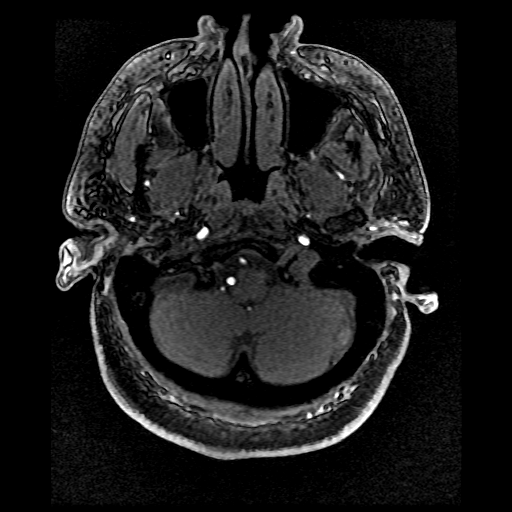
[im 32/136]
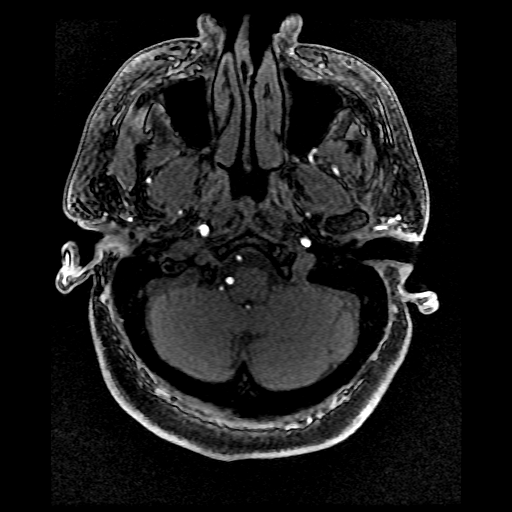
[im 44/136]
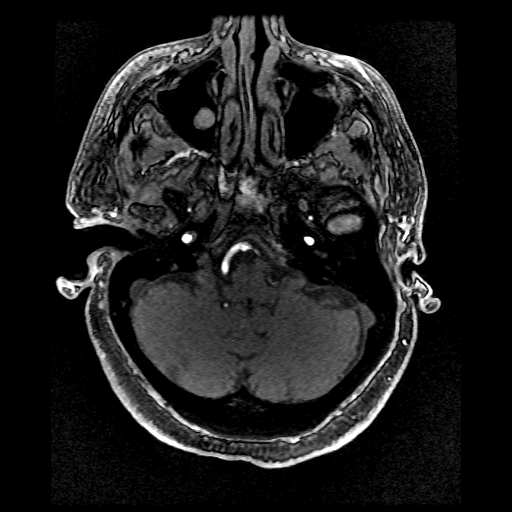
[im 61/136]
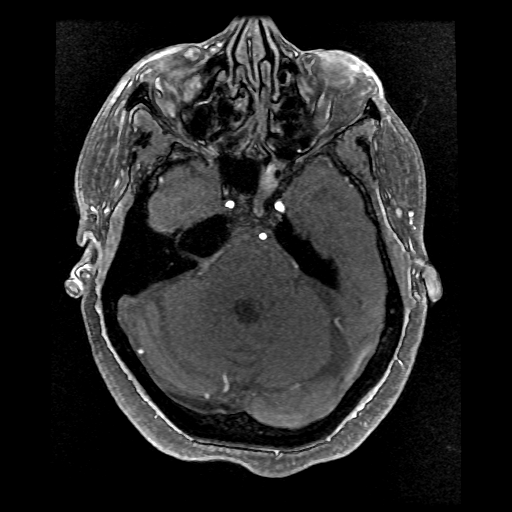
[im 69/136]
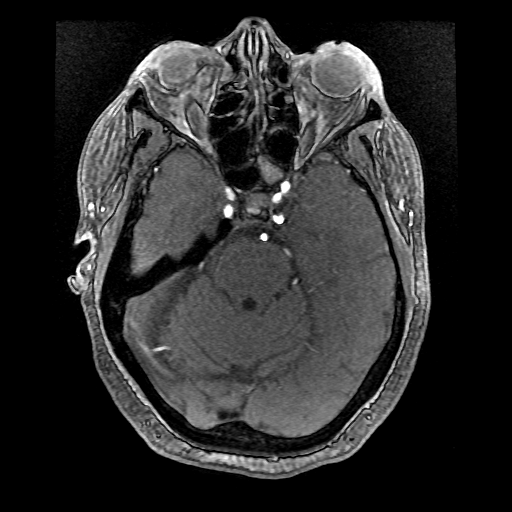
[im 78/136]
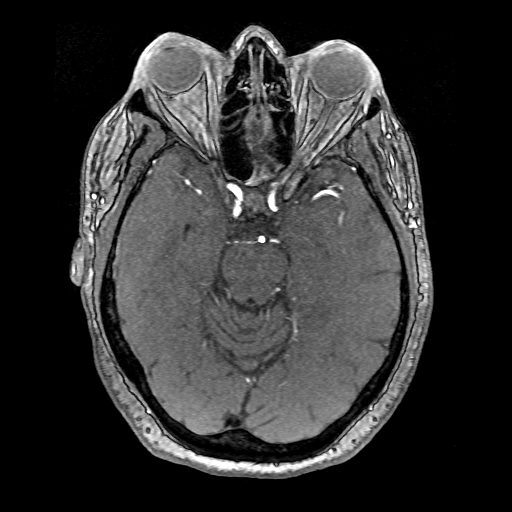
[im 95/136]
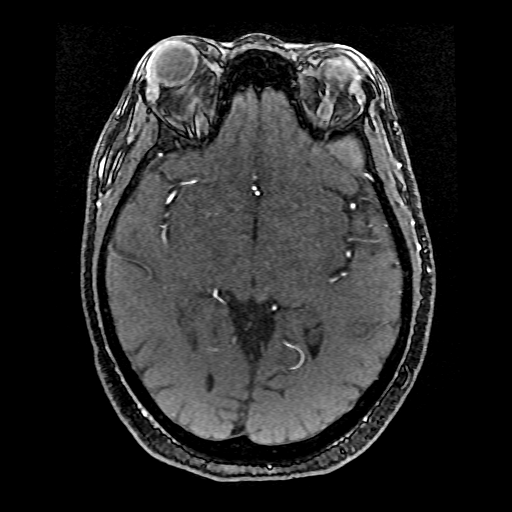
[im 113/136]
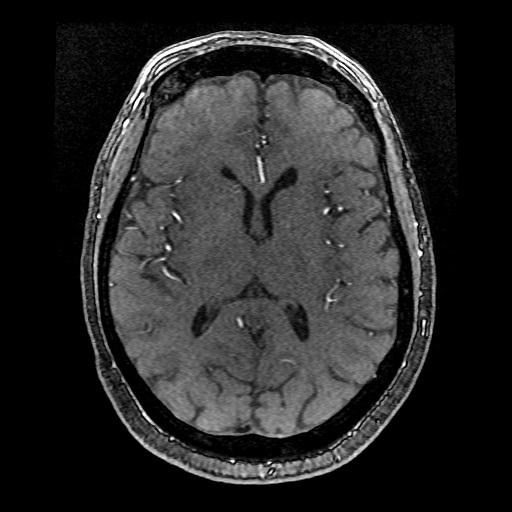
[im 115/136]
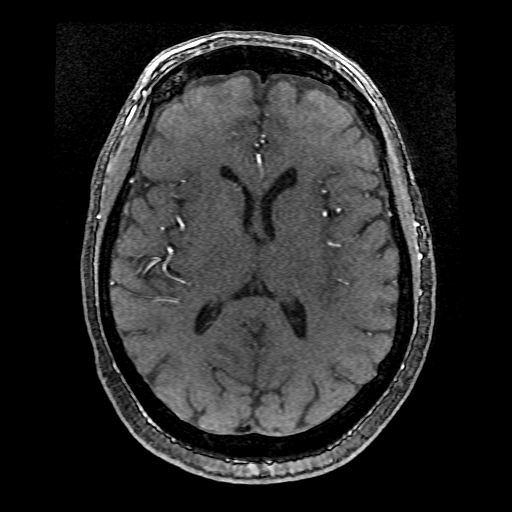
[im 130/136]
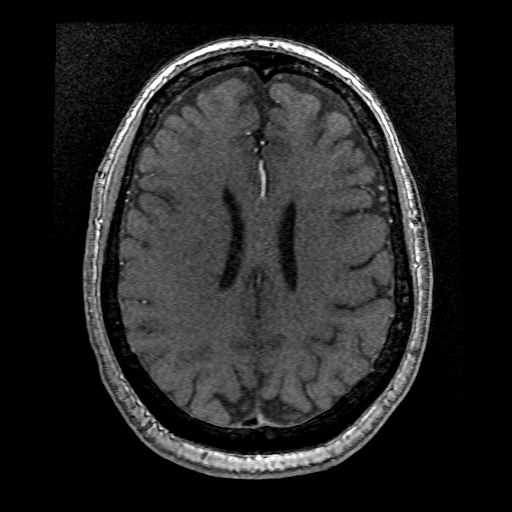

[20 of 48 positions shown; findings below may reference images not displayed]

FINDINGS: Both vertebral arteries widely patent. Right PICA patent. Left PICA
is small and appears to arise from the distal left vertebral artery.
Right AICA patent. Basilar widely patent. Superior cerebellar and
posterior cerebral arteries patent bilaterally.

Internal carotid artery widely patent bilaterally. Anterior and
middle cerebral arteries patent. Mild stenosis of the origin of a
branch of the superior division of the right middle cerebral artery.
This vessel also shows a moderately severe distal stenosis.

Negative for cerebral aneurysm.
IMPRESSION: Atherosclerotic disease right middle cerebral artery branch
otherwise negative. No large vessel occlusion.

## 2022-04-30 ENCOUNTER — Telehealth: Payer: Self-pay | Admitting: Neurology

## 2022-04-30 NOTE — Telephone Encounter (Signed)
I was talking to the patent about her up coming appointment for her sleep study.   She also asked what she needs to do with insurance to get a new walker because stated she can not open or close it.

## 2022-04-30 NOTE — Telephone Encounter (Signed)
Called pt and discussed with her that Janett Billow cannot prescribe her a new walker cause she was only seen for headaches by her. Pt states that is not true. I went back into charts to verify that with her. She states that her PCP will not prescribe her a walker and that she would like to talk to Dr. Leonie Man since he was the one who seen her for her stroke. Please advise.

## 2022-04-30 NOTE — Telephone Encounter (Signed)
Patient is followed in our office for headaches, not for any type of gait impairment.  She can obtain prescription by PCP if new walker is needed.  Thank you.

## 2022-05-04 NOTE — Telephone Encounter (Signed)
Called pt back to relay message. She states that she will do the sleep apnea to get her new Cpap. She states that her walker opens but not closes. She states that she will try to get her PCP to prescribe her a new walker.

## 2022-05-04 NOTE — Telephone Encounter (Signed)
Thank you for letting me know. Providing a prescription for a walker is something that PCP can easily provide for her.  We did not discuss her gait impairment or need for walker at any recent visits.  If her gait impairment is something that she would like to have evaluated by neurology, PCP can place referral to get back into see Dr. Leonie Man for new issue.  Thank you.

## 2022-05-11 ENCOUNTER — Telehealth: Payer: Self-pay | Admitting: Neurology

## 2022-05-11 MED ORDER — ALPRAZOLAM 0.25 MG PO TABS: 0.2500 mg | ORAL_TABLET | Freq: Every evening | ORAL | 0 refills | Status: DC | PRN

## 2022-05-11 NOTE — Telephone Encounter (Signed)
Patient of Dr Clydene Fake asked for a sleep aid  to be used in lab. I will write for 0.25 mg xanax, 2 tablets. CD

## 2022-05-11 NOTE — Telephone Encounter (Signed)
Called and spoke w/ pt. Advised MD called in xanax for her to take 1-2 tab po qhs for sleep study.Pt verbalized understanding. Confirmed it went to correct pharmacy.

## 2022-05-12 ENCOUNTER — Ambulatory Visit (INDEPENDENT_AMBULATORY_CARE_PROVIDER_SITE_OTHER): Payer: Medicare Other | Admitting: Neurology

## 2022-05-12 DIAGNOSIS — I6523 Occlusion and stenosis of bilateral carotid arteries: Secondary | ICD-10-CM

## 2022-05-12 DIAGNOSIS — G4733 Obstructive sleep apnea (adult) (pediatric): Secondary | ICD-10-CM

## 2022-05-12 DIAGNOSIS — I6381 Other cerebral infarction due to occlusion or stenosis of small artery: Secondary | ICD-10-CM

## 2022-05-12 DIAGNOSIS — E1165 Type 2 diabetes mellitus with hyperglycemia: Secondary | ICD-10-CM

## 2022-05-12 DIAGNOSIS — G441 Vascular headache, not elsewhere classified: Secondary | ICD-10-CM

## 2022-05-12 DIAGNOSIS — R269 Unspecified abnormalities of gait and mobility: Secondary | ICD-10-CM

## 2022-05-12 DIAGNOSIS — G43009 Migraine without aura, not intractable, without status migrainosus: Secondary | ICD-10-CM

## 2022-05-20 DIAGNOSIS — J45909 Unspecified asthma, uncomplicated: Secondary | ICD-10-CM | POA: Diagnosis not present

## 2022-05-20 DIAGNOSIS — G4733 Obstructive sleep apnea (adult) (pediatric): Secondary | ICD-10-CM | POA: Diagnosis not present

## 2022-05-22 NOTE — Addendum Note (Signed)
Addended by: Larey Seat on: 05/22/2022 05:31 PM   Modules accepted: Orders

## 2022-05-22 NOTE — Procedures (Signed)
[facility] POLYSOMNOGRAPHY  INTERPRETATION REPORT   STUDY DATE:  05/12/2022     PATIENT NAME:  [name]         DATE OF BIRTH:  1950-09-07  PATIENT ID:  [id number]    TYPE OF STUDY:  SPLIT  READING PHYSICIAN: Larey Seat, MD REFERRED: Leonie Man, MD Frann Rider, NP. SCORING TECHNICIAN: Asencion Partridge Aideliz Garmany, RPSGT   HISTORY:  [name] is a 71 year-old Female patient with recent stroke history, followed by Frann Rider for Dr Leonie Man, MD.  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 Since 2015 she carries a diagnosis of OSA and is currently non-complaint with CPAP.  Migraine and Insomnia were endorsed. This year she underwent bilateral CEA, while in hospital she was told to use a CPAP but her machine is over 36 years old and not working.  ADDITIONAL INFORMATION:  The Epworth Sleepiness Scale endorsed at   3 /24 points (scores above or equal to 10 are suggestive of hypersomnolence). FSS endorsed at   /63 points.  Height: 63 in Weight: 195 lbs (BMI 34 kg/m2) Neck Size: 15 in  MEDICATIONS: Tylenol, Proair HFA, Aspirin, Biotin B1, Vitamin B12, Restasis, Voltaren, Diphenhydramine, Elderberry, Jardiance, Drisdol, Nexium, Bydurion Bcise, Zetia, Lasix, Ibuprofen, Immune Globin, Synthroid, Lidoderm, Claritin, Antivert, Singulair Cellcept, Benicar, Deltasone, Rituxan, Transdermal, Topamax  TECHNICAL DESCRIPTION: A registered sleep technologist (RPSGT) was in attendance for the duration of the recording.  Data collection, scoring, video monitoring, and reporting were performed in compliance with the AASM Manual for the Scoring of Sleep and Associated Events; (Hypopnea is scored based on the criteria listed in Section VIII D. 1b in the AASM Manual V2.6 using a 4% oxygen desaturation rule or Hypopnea is scored based  on the criteria listed in Section VIII D. 1a in the AASM Manual V2.6 using 3% oxygen desaturation and /or arousal rule).   SLEEP CONTINUITY AND SLEEP ARCHITECTURE:  Lights-out was at 20:40: and lights-on at  05:00:, with  8.3 minutes of recording time.  Total sleep time (TST) was 295.0 minutes with a decreased sleep efficiency at 59.0%.  Sleep latency was increased at 82.0 minutes.  REM sleep latency was increased at 172.5 minutes.  Of the total sleep time, the percentage of stage N1 sleep was 3.9%, stage N2 sleep was 78%, stage N3 sleep was 0.0%, and REM sleep was 18.0%. Wake after sleep onset (WASO) time accounted for 123 minutes.  BODY POSITION:  TST was divided between the following sleep positions: supine 110 minutes (37%), non-supine 185 minutes (63%); right 151 minutes (51%), left 33 minutes (11%), and prone 00 minutes (0%). Total supine REM sleep time was 31 minutes (58% of total REM sleep). There were 5 Stage REM periods observed on this study night, 21 awakenings (i.e. transitions to Stage W from any sleep stage), and 55 total stage transitions.   RESPIRATORY MONITORING:  Based on CMS criteria (using a 4% oxygen desaturation rule for scoring hypopneas), there were 7 apneas (7 obstructive; 0 central; 0 mixed), and 39 hypopneas.   The total Apnea index was 1.4/h. Hypopnea index was 7.9/h.  The apnea-hypopnea index or AHI was 9.4/h overall. 18.0/h was supine AHI, 22/h was non-supine AHI. REM AHI was 32.8/h versus a NREM AHI of 4.2 /h.  AHI was 40.6/h in supine REM. OXIMETRY: Oxyhemoglobin Saturation Nadir during sleep was at  61% from a mean of 92%.  Of the Total sleep time (TST) hypoxemia (<89%) was present for 18.6 minutes, or 6.3% of total sleep time.  LIMB MOVEMENTS: There were 0 periodic limb movements of sleep (0.0/h),  AROUSAL:  There were several respiratory effort-related arousals (RERAs) scored as microarousals.  There were 45 arousals in total, for an arousal index of 5  arousals/hour.  Of these, 19 were identified as respiratory-related arousals (4 /h), 0 were PLM-related arousals (0 /h), and 26 were RERAS and non-specific arousals (5 /h). There were 0 occurrences of Cheyne Stokes breathing.  Snoring was classified as moderate. EEG:  PSG EEG was of normal amplitude and frequency, with symmetric manifestation of sleep stages. EKG: The electrocardiogram documented NSR.  The average heart rate during sleep was 91 bpm.  The heart rate during sleep varied between a minimum of 79 and a maximum of  103 bpm. AUDIO and VIDEO: no complex sleep movements, vocalizations or rhythmic movements were recorded.  IMPRESSION: 1) Sleep disordered breathing was present. Obstructive Sleep apnea was seen at a mild degree, associated with an AHI of 9.2/h and associated with oxygen desaturation and snoring in REM and supine sleep.    2) Total sleep time was reduced at 295.0 minutes.  Sleep efficiency was severely decreased at 59.0%, due to delayed onset of sleep.  Sleep fragmentation was noted- The majority of sleep arousals was related to hypopneas and oxygen desaturations, much more pronounced during REM or supine sleep.  RECOMMENDATIONS:  1)REM sleep dependent apnea and supine sleep dependent apnea are related to weakness of the inspiratory muscle groups, and this form of apnea has no alternative treatment options aside from PAP therapy. I strongly recommend an autotitration CPAP to be used, 6-16 cm water pressure, 3 cm EPR and interface of patient's choice and comfort- allowing her to sleep non-supine.  2) Further weight loss is recommended and will also help with DM control.  3)Avoiding supine sleep is helping to reduce snoring and apnea/ hypopneas.  Larey Seat, MD

## 2022-05-25 ENCOUNTER — Telehealth: Payer: Self-pay | Admitting: Neurology

## 2022-05-25 NOTE — Telephone Encounter (Signed)
I called pt. I advised pt that Dr. Brett Fairy reviewed their sleep study results and found that pt has sleep apnea. Dr. Brett Fairy recommends that pt starts auto CPAP. I reviewed PAP compliance expectations with the pt. Pt is agreeable to starting a CPAP. I advised pt that an order will be sent to a DME, [facility], and [facility] will call the pt within about one week after they file with the pt's insurance. [facility] will show the pt how to use the machine, fit for masks, and troubleshoot the CPAP if needed. A follow up appt was made for insurance purposes with Janett Billow, NP on 08/19/2022 at 8:15 am. Pt verbalized understanding to arrive 15 minutes early and bring their CPAP. A letter with all of this information in it will be mailed to the pt as a reminder. I verified with the pt that the address we have on file is correct. Pt verbalized understanding of results. Pt had no questions at this time but was encouraged to call back if questions arise. I have sent the order to [facility] and have received confirmation that they have received the order.

## 2022-05-25 NOTE — Telephone Encounter (Signed)
-----   Message from Larey Seat, MD sent at 05/22/2022  5:31 PM EDT ----- IMPRESSION: 1) Sleep disordered breathing was present. Obstructive Sleep apnea was seen at a mild degree, associated with an AHI of 9.2/h and associated withoxygen desaturation and snoring in REM and supine sleep.  2) Total sleep time was reducedat 295.50mnutes. Sleep efficiency was severely decreasedat 59.0%, due to delayed onset of sleep.Sleep fragmentationwas noted- The majority of sleep arousals was related to hypopneas and oxygen desaturations, much more pronounced during REM or supine sleep.  RECOMMENDATIONS:  1)REM sleep dependent apnea and supine sleep dependent apnea are related to weakness of the inspiratory muscle groups, and this form of apnea has no alternative treatment options aside from PAP therapy. I strongly recommend an autotitration CPAP to be used, 6-16 cm water pressure, 3 cm EPR and interface of patient's choice and comfort- allowing her to sleep non-supine.  2) Further weight loss is recommended and will also help with DM control.  3)Avoiding supine sleep is helping to reduce snoring and apnea/ hypopneas.  CarmenDohmeier, MD

## 2022-06-04 DIAGNOSIS — E113312 Type 2 diabetes mellitus with moderate nonproliferative diabetic retinopathy with macular edema, left eye: Secondary | ICD-10-CM | POA: Diagnosis not present

## 2022-06-04 DIAGNOSIS — G7000 Myasthenia gravis without (acute) exacerbation: Secondary | ICD-10-CM | POA: Diagnosis not present

## 2022-06-04 DIAGNOSIS — Z794 Long term (current) use of insulin: Secondary | ICD-10-CM | POA: Diagnosis not present

## 2022-06-04 DIAGNOSIS — H4311 Vitreous hemorrhage, right eye: Secondary | ICD-10-CM | POA: Diagnosis not present

## 2022-06-04 DIAGNOSIS — H3500 Unspecified background retinopathy: Secondary | ICD-10-CM | POA: Diagnosis not present

## 2022-06-04 DIAGNOSIS — H469 Unspecified optic neuritis: Secondary | ICD-10-CM | POA: Diagnosis not present

## 2022-06-04 DIAGNOSIS — H0011 Chalazion right upper eyelid: Secondary | ICD-10-CM | POA: Diagnosis not present

## 2022-06-04 DIAGNOSIS — Z7984 Long term (current) use of oral hypoglycemic drugs: Secondary | ICD-10-CM | POA: Diagnosis not present

## 2022-06-22 ENCOUNTER — Telehealth: Payer: Self-pay | Admitting: Neurology

## 2022-06-22 NOTE — Telephone Encounter (Signed)
Sent community message to [facility] to get update on pt. Waiting on response.

## 2022-06-22 NOTE — Telephone Encounter (Signed)
Pt is calling. Stated she talk to someone at the [facility] and was told they don't have anymore CPAP machines and she is # 25 on the list. Pt said she need to know what else can be done because she can't sleep at night. Pt is requesting a call back from the nurse.

## 2022-06-23 NOTE — Telephone Encounter (Signed)
Messaged Melissa Stenson directly this morning to see about getting update on pt. Melissa responded and is looking into this for [place]. Waiting on response.

## 2022-06-23 NOTE — Telephone Encounter (Signed)
Melissa called back. They thought we requested she get resmed S11. They do not have S11 available right now. They have plenty of S10 with wifi capability. Advised it would be fine for pt to get S10. They will f/u with pt about getting set up, nothing further needed from our office.

## 2022-06-25 DIAGNOSIS — G4733 Obstructive sleep apnea (adult) (pediatric): Secondary | ICD-10-CM | POA: Diagnosis not present

## 2022-07-11 DIAGNOSIS — Z1231 Encounter for screening mammogram for malignant neoplasm of breast: Secondary | ICD-10-CM | POA: Diagnosis not present

## 2022-07-12 DIAGNOSIS — D8989 Other specified disorders involving the immune mechanism, not elsewhere classified: Secondary | ICD-10-CM | POA: Diagnosis not present

## 2022-07-12 DIAGNOSIS — Z79899 Other long term (current) drug therapy: Secondary | ICD-10-CM | POA: Diagnosis not present

## 2022-07-12 DIAGNOSIS — H3500 Unspecified background retinopathy: Secondary | ICD-10-CM | POA: Diagnosis not present

## 2022-07-23 DIAGNOSIS — E7800 Pure hypercholesterolemia, unspecified: Secondary | ICD-10-CM | POA: Diagnosis not present

## 2022-07-23 DIAGNOSIS — I10 Essential (primary) hypertension: Secondary | ICD-10-CM | POA: Diagnosis not present

## 2022-07-23 DIAGNOSIS — E1165 Type 2 diabetes mellitus with hyperglycemia: Secondary | ICD-10-CM | POA: Diagnosis not present

## 2022-07-23 DIAGNOSIS — E890 Postprocedural hypothyroidism: Secondary | ICD-10-CM | POA: Diagnosis not present

## 2022-07-25 DIAGNOSIS — G4733 Obstructive sleep apnea (adult) (pediatric): Secondary | ICD-10-CM | POA: Diagnosis not present

## 2022-07-29 DIAGNOSIS — Z79899 Other long term (current) drug therapy: Secondary | ICD-10-CM | POA: Diagnosis not present

## 2022-07-29 DIAGNOSIS — D8989 Other specified disorders involving the immune mechanism, not elsewhere classified: Secondary | ICD-10-CM | POA: Diagnosis not present

## 2022-07-29 DIAGNOSIS — H3500 Unspecified background retinopathy: Secondary | ICD-10-CM | POA: Diagnosis not present

## 2022-07-30 DIAGNOSIS — I6523 Occlusion and stenosis of bilateral carotid arteries: Secondary | ICD-10-CM | POA: Diagnosis not present

## 2022-07-30 DIAGNOSIS — G7000 Myasthenia gravis without (acute) exacerbation: Secondary | ICD-10-CM | POA: Diagnosis not present

## 2022-07-30 DIAGNOSIS — Z9641 Presence of insulin pump (external) (internal): Secondary | ICD-10-CM | POA: Diagnosis not present

## 2022-07-30 DIAGNOSIS — E7800 Pure hypercholesterolemia, unspecified: Secondary | ICD-10-CM | POA: Diagnosis not present

## 2022-07-30 DIAGNOSIS — C73 Malignant neoplasm of thyroid gland: Secondary | ICD-10-CM | POA: Diagnosis not present

## 2022-07-30 DIAGNOSIS — E1165 Type 2 diabetes mellitus with hyperglycemia: Secondary | ICD-10-CM | POA: Diagnosis not present

## 2022-07-30 DIAGNOSIS — I10 Essential (primary) hypertension: Secondary | ICD-10-CM | POA: Diagnosis not present

## 2022-07-30 DIAGNOSIS — E890 Postprocedural hypothyroidism: Secondary | ICD-10-CM | POA: Diagnosis not present

## 2022-07-30 DIAGNOSIS — I6930 Unspecified sequelae of cerebral infarction: Secondary | ICD-10-CM | POA: Diagnosis not present

## 2022-07-30 DIAGNOSIS — E559 Vitamin D deficiency, unspecified: Secondary | ICD-10-CM | POA: Diagnosis not present

## 2022-07-30 DIAGNOSIS — I69393 Ataxia following cerebral infarction: Secondary | ICD-10-CM | POA: Diagnosis not present

## 2022-07-31 DIAGNOSIS — N6321 Unspecified lump in the left breast, upper outer quadrant: Secondary | ICD-10-CM | POA: Diagnosis not present

## 2022-07-31 DIAGNOSIS — R922 Inconclusive mammogram: Secondary | ICD-10-CM | POA: Diagnosis not present

## 2022-07-31 DIAGNOSIS — N6325 Unspecified lump in the left breast, overlapping quadrants: Secondary | ICD-10-CM | POA: Diagnosis not present

## 2022-07-31 DIAGNOSIS — R928 Other abnormal and inconclusive findings on diagnostic imaging of breast: Secondary | ICD-10-CM | POA: Diagnosis not present

## 2022-08-03 ENCOUNTER — Other Ambulatory Visit: Payer: Self-pay | Admitting: Radiology

## 2022-08-03 DIAGNOSIS — N6325 Unspecified lump in the left breast, overlapping quadrants: Secondary | ICD-10-CM | POA: Diagnosis not present

## 2022-08-03 DIAGNOSIS — N6032 Fibrosclerosis of left breast: Secondary | ICD-10-CM | POA: Diagnosis not present

## 2022-08-03 DIAGNOSIS — C50412 Malignant neoplasm of upper-outer quadrant of left female breast: Secondary | ICD-10-CM | POA: Diagnosis not present

## 2022-08-03 DIAGNOSIS — N6321 Unspecified lump in the left breast, upper outer quadrant: Secondary | ICD-10-CM | POA: Diagnosis not present

## 2022-08-03 DIAGNOSIS — D242 Benign neoplasm of left breast: Secondary | ICD-10-CM | POA: Diagnosis not present

## 2022-08-03 DIAGNOSIS — N6092 Unspecified benign mammary dysplasia of left breast: Secondary | ICD-10-CM | POA: Diagnosis not present

## 2022-08-04 ENCOUNTER — Encounter: Payer: Self-pay | Admitting: Family Medicine

## 2022-08-05 ENCOUNTER — Telehealth: Payer: Self-pay | Admitting: Hematology and Oncology

## 2022-08-05 DIAGNOSIS — G7000 Myasthenia gravis without (acute) exacerbation: Secondary | ICD-10-CM | POA: Diagnosis not present

## 2022-08-05 DIAGNOSIS — Z9889 Other specified postprocedural states: Secondary | ICD-10-CM | POA: Diagnosis not present

## 2022-08-05 DIAGNOSIS — M47816 Spondylosis without myelopathy or radiculopathy, lumbar region: Secondary | ICD-10-CM | POA: Diagnosis not present

## 2022-08-05 DIAGNOSIS — I69393 Ataxia following cerebral infarction: Secondary | ICD-10-CM | POA: Diagnosis not present

## 2022-08-05 DIAGNOSIS — E890 Postprocedural hypothyroidism: Secondary | ICD-10-CM | POA: Diagnosis not present

## 2022-08-05 DIAGNOSIS — H00016 Hordeolum externum left eye, unspecified eyelid: Secondary | ICD-10-CM | POA: Diagnosis not present

## 2022-08-05 DIAGNOSIS — Z0000 Encounter for general adult medical examination without abnormal findings: Secondary | ICD-10-CM | POA: Diagnosis not present

## 2022-08-05 DIAGNOSIS — G4733 Obstructive sleep apnea (adult) (pediatric): Secondary | ICD-10-CM | POA: Diagnosis not present

## 2022-08-05 DIAGNOSIS — I6930 Unspecified sequelae of cerebral infarction: Secondary | ICD-10-CM | POA: Diagnosis not present

## 2022-08-05 DIAGNOSIS — K219 Gastro-esophageal reflux disease without esophagitis: Secondary | ICD-10-CM | POA: Diagnosis not present

## 2022-08-05 DIAGNOSIS — E118 Type 2 diabetes mellitus with unspecified complications: Secondary | ICD-10-CM | POA: Diagnosis not present

## 2022-08-05 DIAGNOSIS — D0592 Unspecified type of carcinoma in situ of left breast: Secondary | ICD-10-CM | POA: Diagnosis not present

## 2022-08-05 NOTE — Telephone Encounter (Signed)
Spoke to patient to confirm upcoming morning [facility] clinic appointment on 12/20, paperwork will be sent via [facility].   Gave location and time, also informed patient that the surgeon's office would be calling as well to get information from them similar to the packet that they will be receiving so make sure to do both.  Reminded patient that all providers will be coming to the clinic to see them HERE and if they had any questions to not hesitate to reach back out to myself or their navigators.

## 2022-08-05 NOTE — Telephone Encounter (Signed)
Left message for patient to call me back in reference to clinic appointment on 12/20 at 815

## 2022-08-10 ENCOUNTER — Encounter: Payer: Self-pay | Admitting: *Deleted

## 2022-08-10 DIAGNOSIS — C50412 Malignant neoplasm of upper-outer quadrant of left female breast: Secondary | ICD-10-CM

## 2022-08-10 NOTE — Progress Notes (Signed)
Radiation Oncology         [phone] ________________________________  Name: [name]        MRN: [id number]  Date of Service: 08/12/2022 DOB: October 18, 1950  JO:[id number], [name], DO  Coralie Keens, MD     REFERRING PHYSICIAN: Coralie Keens, MD   DIAGNOSIS: The encounter diagnosis was Malignant neoplasm of upper-outer quadrant of left breast in female, estrogen receptor positive ([place]).   HISTORY OF PRESENT ILLNESS: [name] is a 71 y.o. female seen in the multidisciplinary breast clinic for a new diagnosis of left breast cancer. The patient was noted to have screening detected group of masses in the left breast.  By diagnostic ultrasound in the 2 o'clock position of the left breast was a 7 mm round mass corresponding to the mammogram findings with internal calcifications.  In the 3 o'clock position there was a 7 mm irregular mass with irregular margins also correlating to the findings by imaging.  Her left axilla was negative for adenopathy.  Biopsies on 08/03/2022 were taken in the 2:00, 230 o'clock, and 3 o'clock position.  The 3:00 biopsy showed stromal fibrosis negative for malignancy.  At 2:30 the biopsy showed intraductal papilloma with apocrine metaplasia.  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  Her cancer was ER positive weak staining PR negative, HER2 negative with a Ki-67 of 5%.  She is seen today to discuss treatment recommendations of her cancer.    PREVIOUS RADIATION THERAPY: {EXAM; YES/NO:19492::"No"}   PAST MEDICAL HISTORY:  Past Medical History:  Diagnosis Date   Arthritis    "all over my body" (03/13/2013)   Asthma    Asymptomatic carotid artery stenosis, bilateral 10/08/2018   GERD (gastroesophageal reflux disease)    H/O hiatal hernia    Headache    pt states she has had headaches for about 6 months "off and on"   Heart murmur    pt had echocardiogram on 09/17/21   Hypercholesterolemia  10/08/2018   Hypertension    Hypothyroidism    Myasthenia gravis ([place])    "in my eyes; diagnsosed > 7 yr ago" (03/13/2013)   Sleep apnea    on CPAP   Stroke ([place]) 2021   Thyroid carcinoma ([place])    Type II diabetes mellitus ([place])        PAST SURGICAL HISTORY: Past Surgical History:  Procedure Laterality Date   ABDOMINAL HYSTERECTOMY     ANTERIOR CERVICAL DECOMP/DISCECTOMY FUSION     "I've had severa ORs; always went in from the front" (03/13/2013)   [place]     "several" (03/13/2013)   CARPAL TUNNEL RELEASE Right    CATARACT EXTRACTION W/ INTRAOCULAR LENS  IMPLANT, BILATERAL Bilateral    CHOLECYSTECTOMY     KNEE ARTHROSCOPY Left    SHOULDER ARTHROSCOPY W/ ROTATOR CUFF REPAIR Left    TONSILLECTOMY     TOTAL THYROIDECTOMY     TRANSCAROTID ARTERY REVASCULARIZATION  Left 09/24/2021   Procedure: LEFT TRANSCAROTID ARTERY REVASCULARIZATION;  Surgeon: Cherre Robins, MD;  Location: [facility] OR;  Service: Vascular;  Laterality: Left;   TRANSCAROTID ARTERY REVASCULARIZATION  Right 10/23/2021   Procedure: Right Transcarotid Artery Revascularization;  Surgeon: Cherre Robins, MD;  Location: [facility];  Service: Vascular;  Laterality: Right;   ULTRASOUND GUIDANCE FOR VASCULAR ACCESS Right 09/24/2021   Procedure: ULTRASOUND GUIDANCE FOR VASCULAR ACCESS;  Surgeon: Cherre Robins, MD;  Location: [facility];  Service: Vascular;  Laterality: Right;   ULTRASOUND GUIDANCE FOR VASCULAR ACCESS Right 10/23/2021   Procedure: ULTRASOUND GUIDANCE FOR VASCULAR ACCESS, LEFT FEMORAL VEIN;  Surgeon: Cherre Robins, MD;  Location: [facility] OR;  Service: Vascular;  Laterality: Right;     FAMILY HISTORY:  Family History  Problem Relation Age of Onset   Coronary artery disease Sister        s/p coronary stenting   Stroke Sister 41   Diabetes Sister    Congestive Heart Failure Mother    Asthma Mother    Diabetes Mother    Congestive Heart Failure Father    Lung cancer Father    Asthma  Father    Diabetes Father    Diabetes Brother      SOCIAL HISTORY:  reports that she has never smoked. She has never used smokeless tobacco. She reports that she does not drink alcohol and does not use drugs. The patient is single and lives in [place]. She ***.   ALLERGIES: Contrast media [iodinated contrast media], Fluorescein, Iodine, Molds & smuts, Shellfish-derived products, Azithromycin, Codeine, Metformin hcl er, Tramadol hcl, and Other   MEDICATIONS:  Current Outpatient Medications  Medication Sig Dispense Refill   acetaminophen (TYLENOL) 500 MG tablet Take 500 mg by mouth every 6 (six) hours as needed (pain).     ALPRAZolam (XANAX) 0.25 MG tablet Take 1 tablet (0.25 mg total) by mouth at bedtime as needed for anxiety. 2 tablet 0   aspirin EC 81 MG EC tablet Take 1 tablet (81 mg total) by mouth daily.     Biotin 5000 MCG CAPS Take 5,000 mcg by mouth daily with breakfast.      cycloSPORINE (RESTASIS) 0.05 % ophthalmic emulsion Place 1 drop into both eyes 2 (two) times daily. 0.4 mL 0   diclofenac Sodium (VOLTAREN) 1 % GEL Apply 2 g topically 4 (four) times daily. (Patient taking differently: Apply 1 application  topically daily as needed (pain).) 2 g 0   diphenhydrAMINE 25 mg in sodium chloride 0.9 % 50 mL Inject 25 mg into the vein See admin instructions. Administer 25 mg intravenously at start of Gamunex infusion, then administer 25 mg during infusion     Elderberry 500 MG CAPS Take 500 mg by mouth daily.     empagliflozin (JARDIANCE) 25 MG TABS tablet Take 25 mg by mouth daily.     esomeprazole (NEXIUM) 40 MG capsule Take 1 capsule (40 mg total) by mouth 2 (two) times daily. 60 capsule 0   Exenatide ER (BYDUREON BCISE) 2 MG/0.85ML AUIJ Inject 2 mg into the skin every Monday.     ezetimibe (ZETIA) 10 MG tablet TAKE 1 TABLET BY MOUTH EVERY DAY (Patient taking differently: Take 10 mg by mouth daily.) 90 tablet 1   furosemide (LASIX) 40 MG tablet Take 40 mg by mouth.     IBUPROFEN  PO Take by mouth.     Immune Globulin, Human, 40 GM/400ML SOLN Infusions are administered at home by home health nurse - last infusion 09/08/21 and 09/09/21; next infusion due 10/19/21 and 10/20/21     Insulin Disposable Pump (OMNIPOD DASH 5 PACK PODS) MISC Use with Humalog     levothyroxine (SYNTHROID) 150 MCG tablet Take 150 mcg by mouth daily before breakfast.     lidocaine (LIDODERM) 5 % Place 1 patch onto the skin daily. Remove & Discard patch within 12 hours or as directed by MD (Patient taking differently: Place 1 patch onto the skin daily as needed. Remove & Discard patch  within 12 hours or as directed by MD) 30 patch 0   loratadine (CLARITIN) 10 MG tablet Take 1 tablet (10 mg total) by mouth at bedtime. 30 tablet 0   meclizine (ANTIVERT) 25 MG tablet Take 25 mg by mouth 3 (three) times daily as needed for dizziness (or migraine-related nausea).      montelukast (SINGULAIR) 10 MG tablet Take 1 tablet (10 mg total) by mouth every morning. 30 tablet 0   mycophenolate (CELLCEPT) 500 MG tablet Take 1,500 mg by mouth 2 (two) times daily. For myasthenia gravis     olmesartan (BENICAR) 20 MG tablet Take 1 tablet (20 mg total) by mouth daily. 30 tablet 0   predniSONE (DELTASONE) 10 MG tablet Take 7.5 mg by mouth every morning. For myasthenia gravis     PRESCRIPTION MEDICATION Inhale into the lungs at bedtime. CPAP     PROAIR HFA 108 (90 BASE) MCG/ACT inhaler Inhale 2 puffs into the lungs every 6 (six) hours as needed for wheezing or shortness of breath.      riTUXimab (RITUXAN) 500 MG/50ML injection Inject into the vein every 6 (six) months.     rosuvastatin (CRESTOR) 40 MG tablet Take 1 tablet (40 mg total) by mouth daily. 90 tablet 3   scopolamine (TRANSDERM-SCOP) 1 MG/3DAYS Place 1 patch onto the skin every 3 (three) days.     topiramate (TOPAMAX) 50 MG tablet Take 1 tablet (50 mg total) by mouth at bedtime. 90 tablet 3   vitamin B-12 (CYANOCOBALAMIN) 1000 MCG tablet Take 1,000 mcg by mouth daily.      Vitamin D, Ergocalciferol, (DRISDOL) 50000 UNITS CAPS capsule Take 50,000 Units by mouth every Monday.      No current facility-administered medications for this visit.     REVIEW OF SYSTEMS: On review of systems, the patient reports that she is doing ***     PHYSICAL EXAM:  Wt Readings from Last 3 Encounters:  04/09/22 195 lb (88.5 kg)  02/11/22 197 lb (89.4 kg)  12/09/21 188 lb (85.3 kg)   Temp Readings from Last 3 Encounters:  12/09/21 98.6 F (37 C)  10/27/21 98.2 F (36.8 C) (Oral)  10/15/21 (P) 98.5 F (36.9 C) ((P) Oral)   BP Readings from Last 3 Encounters:  04/09/22 124/68  02/11/22 (!) 174/81  12/09/21 (!) 154/82   Pulse Readings from Last 3 Encounters:  04/09/22 94  02/11/22 99  12/09/21 89    In general this is a well appearing *** female in no acute distress. She's alert and oriented x4 and appropriate throughout the examination. Cardiopulmonary assessment is negative for acute distress and she exhibits normal effort. Bilateral breast exam is deferred.    ECOG = ***  0 - Asymptomatic (Fully active, able to carry on all predisease activities without restriction)  1 - Symptomatic but completely ambulatory (Restricted in physically strenuous activity but ambulatory and able to carry out work of a light or sedentary nature. For example, light housework, office work)  2 - Symptomatic, <50% in bed during the day (Ambulatory and capable of all self care but unable to carry out any work activities. Up and about more than 50% of waking hours)  3 - Symptomatic, >50% in bed, but not bedbound (Capable of only limited self-care, confined to bed or chair 50% or more of waking hours)  4 - Bedbound (Completely disabled. Cannot carry on any self-care. Totally confined to bed or chair)  5 - Death   Eustace Pen MM, Creech RH, [place],  et al. [phone]). "Toxicity and response criteria of the [facility]". [place] Oncol. 5 (6):  649-55    LABORATORY DATA:  Lab Results  Component Value Date   WBC 7.3 10/24/2021   HGB 7.9 (L) 10/24/2021   HCT 25.6 (L) 10/24/2021   MCV 97.3 10/24/2021   PLT 209 10/24/2021   Lab Results  Component Value Date   NA 137 10/24/2021   K 3.5 10/24/2021   CL 109 10/24/2021   CO2 17 (L) 10/24/2021   Lab Results  Component Value Date   ALT 26 10/15/2021   AST 22 10/15/2021   ALKPHOS 130 (H) 10/15/2021   BILITOT 0.3 10/15/2021      RADIOGRAPHY: No results found.     IMPRESSION/PLAN: 1. 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 Dr. Lisbeth Renshaw discusses the pathology findings and reviews the nature of early stage left breast disease. The consensus from the breast conference includes breast conservation with lumpectomy with  sentinel node biopsy. Depending on the size of the final tumor measurements rendered by pathology, the tumor may be tested for Oncotype Dx score to determine a role for systemic therapy. Provided that chemotherapy is not indicated, the patient's course would then be followed by external radiotherapy to the breast  to reduce risks of local recurrence followed by antiestrogen therapy. We discussed the risks, benefits, short, and long term effects of radiotherapy, as well as the curative intent, and the patient is interested in proceeding. Dr. Lisbeth Renshaw discusses the delivery and logistics of radiotherapy and anticipates a course of 4 or up to 6 1/2 weeks of radiotherapy to the left breast with deep inspiration breath hold technique. We will see her back a few weeks after surgery to discuss the simulation process and anticipate we starting radiotherapy about 4-6 weeks after surgery.  2. Possible genetic predisposition to malignancy. The patient is a candidate for genetic testing given her personal history. She will meet with our geneticist today in clinic.   In a visit lasting *** minutes, greater than 50% of the time was spent  face to face reviewing her case, as well as in preparation of, discussing, and coordinating the patient's care.  The above documentation reflects my direct findings during this shared patient visit. Please see the separate note by Dr. Lisbeth Renshaw on this date for the remainder of the patient's plan of care.    Carola Rhine, [facility]    **Disclaimer: This note was dictated with voice recognition software. Similar sounding words can inadvertently be transcribed and this note may contain transcription errors which may not have been corrected upon publication of note.**

## 2022-08-12 ENCOUNTER — Other Ambulatory Visit: Payer: Self-pay

## 2022-08-12 ENCOUNTER — Encounter: Payer: Self-pay | Admitting: Hematology

## 2022-08-12 ENCOUNTER — Encounter: Payer: Self-pay | Admitting: *Deleted

## 2022-08-12 ENCOUNTER — Ambulatory Visit: Payer: Medicare Other | Attending: Surgery | Admitting: Physical Therapy

## 2022-08-12 ENCOUNTER — Inpatient Hospital Stay (HOSPITAL_BASED_OUTPATIENT_CLINIC_OR_DEPARTMENT_OTHER): Payer: Medicare Other | Admitting: Hematology

## 2022-08-12 ENCOUNTER — Inpatient Hospital Stay: Payer: Medicare Other | Admitting: Licensed Clinical Social Worker

## 2022-08-12 ENCOUNTER — Ambulatory Visit
Admission: RE | Admit: 2022-08-12 | Discharge: 2022-08-12 | Disposition: A | Discharge status: Home / Self Care | Payer: Medicare Other | Source: Ambulatory Visit | Attending: Radiation Oncology | Admitting: Radiation Oncology

## 2022-08-12 ENCOUNTER — Telehealth: Payer: Self-pay | Admitting: Genetic Counselor

## 2022-08-12 ENCOUNTER — Other Ambulatory Visit: Payer: Self-pay | Admitting: Surgery

## 2022-08-12 ENCOUNTER — Encounter: Payer: Self-pay | Admitting: Radiation Oncology

## 2022-08-12 ENCOUNTER — Inpatient Hospital Stay: Payer: Medicare Other | Attending: Hematology

## 2022-08-12 ENCOUNTER — Encounter: Payer: Self-pay | Admitting: Physical Therapy

## 2022-08-12 VITALS — BP 163/64 | HR 95 | Temp 98.8°F | Resp 18 | Ht 63.0 in | Wt 200.0 lb

## 2022-08-12 DIAGNOSIS — E2839 Other primary ovarian failure: Secondary | ICD-10-CM

## 2022-08-12 DIAGNOSIS — G7000 Myasthenia gravis without (acute) exacerbation: Secondary | ICD-10-CM

## 2022-08-12 DIAGNOSIS — R293 Abnormal posture: Secondary | ICD-10-CM | POA: Diagnosis not present

## 2022-08-12 DIAGNOSIS — Z853 Personal history of malignant neoplasm of breast: Secondary | ICD-10-CM

## 2022-08-12 DIAGNOSIS — C50412 Malignant neoplasm of upper-outer quadrant of left female breast: Secondary | ICD-10-CM | POA: Diagnosis not present

## 2022-08-12 DIAGNOSIS — Z170 Estrogen receptor positive status [ER+]: Secondary | ICD-10-CM | POA: Insufficient documentation

## 2022-08-12 DIAGNOSIS — Z79624 Long term (current) use of inhibitors of nucleotide synthesis: Secondary | ICD-10-CM | POA: Diagnosis not present

## 2022-08-12 DIAGNOSIS — C50912 Malignant neoplasm of unspecified site of left female breast: Secondary | ICD-10-CM | POA: Diagnosis not present

## 2022-08-12 DIAGNOSIS — Z79620 Long term (current) use of immunosuppressive biologic: Secondary | ICD-10-CM | POA: Diagnosis not present

## 2022-08-12 DIAGNOSIS — Z8673 Personal history of transient ischemic attack (TIA), and cerebral infarction without residual deficits: Secondary | ICD-10-CM | POA: Insufficient documentation

## 2022-08-12 DIAGNOSIS — C50411 Malignant neoplasm of upper-outer quadrant of right female breast: Secondary | ICD-10-CM | POA: Diagnosis not present

## 2022-08-12 DIAGNOSIS — Z7952 Long term (current) use of systemic steroids: Secondary | ICD-10-CM | POA: Insufficient documentation

## 2022-08-12 LAB — CBC WITH DIFFERENTIAL (CANCER CENTER ONLY)
Abs Immature Granulocytes: 0.02 10*3/uL (ref 0.00–0.07)
Basophils Absolute: 0 10*3/uL (ref 0.0–0.1)
Basophils Relative: 1 %
Eosinophils Absolute: 0.2 10*3/uL (ref 0.0–0.5)
Eosinophils Relative: 2 %
HCT: 32.1 % — ABNORMAL LOW (ref 36.0–46.0)
Hemoglobin: 10.4 g/dL — ABNORMAL LOW (ref 12.0–15.0)
Immature Granulocytes: 0 %
Lymphocytes Relative: 13 %
Lymphs Abs: 1.1 10*3/uL (ref 0.7–4.0)
MCH: 30.5 pg (ref 26.0–34.0)
MCHC: 32.4 g/dL (ref 30.0–36.0)
MCV: 94.1 fL (ref 80.0–100.0)
Monocytes Absolute: 0.7 10*3/uL (ref 0.1–1.0)
Monocytes Relative: 9 %
Neutro Abs: 5.9 10*3/uL (ref 1.7–7.7)
Neutrophils Relative %: 75 %
Platelet Count: 260 10*3/uL (ref 150–400)
RBC: 3.41 MIL/uL — ABNORMAL LOW (ref 3.87–5.11)
RDW: 13.7 % (ref 11.5–15.5)
WBC Count: 8 10*3/uL (ref 4.0–10.5)
nRBC: 0 % (ref 0.0–0.2)

## 2022-08-12 LAB — CMP (CANCER CENTER ONLY)
ALT: 18 U/L (ref 0–44)
AST: 20 U/L (ref 15–41)
Albumin: 3.5 g/dL (ref 3.5–5.0)
Alkaline Phosphatase: 127 U/L — ABNORMAL HIGH (ref 38–126)
Anion gap: 7 (ref 5–15)
BUN: 16 mg/dL (ref 8–23)
CO2: 22 mmol/L (ref 22–32)
Calcium: 9 mg/dL (ref 8.9–10.3)
Chloride: 112 mmol/L — ABNORMAL HIGH (ref 98–111)
Creatinine: 1.22 mg/dL — ABNORMAL HIGH (ref 0.44–1.00)
GFR, Estimated: 48 mL/min — ABNORMAL LOW (ref >60)
Glucose, Bld: 177 mg/dL — ABNORMAL HIGH (ref 70–99)
Potassium: 3.2 mmol/L — ABNORMAL LOW (ref 3.5–5.1)
Sodium: 141 mmol/L (ref 135–145)
Total Bilirubin: 0.4 mg/dL (ref 0.3–1.2)
Total Protein: 7.1 g/dL (ref 6.5–8.1)

## 2022-08-12 LAB — GENETIC SCREENING ORDER

## 2022-08-12 NOTE — Progress Notes (Signed)
[place]   [phone] Fax:([phone]) [place] Note   Patient Care Team: [name], DO as PCP - General (Family Medicine) Garvin Fila, MD as Consulting Physician (Neurology) Frann Rider, NP as Nurse Practitioner (Neurology) Rockwell Germany, RN as Oncology Nurse Navigator Mauro Kaufmann, RN as Oncology Nurse Navigator Coralie Keens, MD as Consulting Physician (General Surgery) Truitt Merle, MD as Consulting Physician (Hematology) Kyung Rudd, MD as Consulting Physician (Radiation Oncology)  Date of Service:  08/12/2022   CHIEF COMPLAINTS/PURPOSE OF CONSULTATION:  Left Breast Cancer, ER+  REFERRING PHYSICIAN:  [facility]    ASSESSMENT & PLAN:  [name] is a 71 y.o. hysterectomy female with a history of   Malignant neoplasm of upper-outer quadrant of left breast in female, estrogen receptor positive ([place]) Assessment & Plan: -Invasive ductal carcinoma,cT1bN0M0, stage IA, grade 3, ER 40% weak positive, PR negative, HER2 negative by FISH ---We discussed her imaging findings and the biopsy results in great details. -Giving the early stage disease, she is a candidate for lumpectomy and sentinel lymph node biopsy. She is agreeable with that. She was seen by Dr. Ninfa Linden today and likely will proceed with surgery soon.  -Although this is very early stage disease, her prognostic markers shows high-grade disease, weakly ER expression, negative PR, indicating more aggressive biology. -Due to her medical comorbidities, on severe immunosuppressant for myasthenia gravis, she is a poor candidate for chemotherapy.  If the tumor is less than 1 cm on surgical sample, I would not recommend further testing for Oncotype.  But if the tumor is a more than 1 cm, or if she has positive lymph nodes, we will repeat ER and PR, and decide if she needs Oncotype to predict her risk of recurrence and benefit of adjuvant chemo.  --Giving the positive ER  expression ann her postmenopausal status, I recommend adjuvant endocrine therapy with aromatase inhibitor or tamoxifen for a total of 5-10 years to reduce the risk of cancer recurrence. Potential benefits and side effects were discussed with patient and she is interested. -She was also seen by radiation oncologist Dr. Lisbeth Renshaw today.  Adjuvant radiation was discussed with her.  -We also discussed the breast cancer surveillance after her surgery. She will continue annual screening mammogram, self exam, and a routine office visit with lab and exam with [place]. -I encouraged her to have healthy diet and exercise regularly.     Estrogen deficiency -     DG Bone Density; Future  Myasthenia gravis [facility]) Assessment & Plan: -She is on Rituxan, CellCept and prednisone.    PLAN:  - lab reviewed  -Discuss have a Bone density Scan in next few months at [facility], I ordered today  -Discuss the cancer and the stage -Discuss the surgery,radiation and antiestrogen  -if tumor >1cm or positive node on surgical sample, will repeat prognostic markers, and order Oncotype if ER still positive.    Oncology History  Malignant neoplasm of upper-outer quadrant of left breast in female, estrogen receptor positive ([place])  08/10/2022 Initial Diagnosis   Malignant neoplasm of upper-outer quadrant of left female breast ([place])   08/10/2022 Cancer Staging   Staging form: Breast, AJCC 8th Edition - Clinical: Stage IB (cT1b, cN0, cM0, G3, ER+, PR-, HER2-) - Signed by Hayden Pedro, PA-C on 08/10/2022 Stage prefix: Initial diagnosis Method of lymph node assessment: Clinical Histologic grading system: 3 grade system      HISTORY OF PRESENTING ILLNESS:  [name] 71  y.o. female is a here because of breast cancer. The patient was referred by [facility]. The patient presents to the clinic today accompanied by her sister. Pt had no pain , no abnormal Mammogram.   She had routine screening mammography on 07/23/2022  which showed a multiple masses in the left upper outer quadrant which are indeterminate. She underwent diagnostic mammography and  breast ultrasonography on 07/31/22 showing 3 small masses, the 6 x 5 x 7 mm mass at the 2:00, 7 cm from nipple was biopsied and showed invasive ductal carcinoma, grade 3, no lymphovascular invasion, ER 40% positive weak staining, PR negative, HER2 negative by FISH, Ki-67 5%.  There is 3 mm mass at the 230 and 7 mm mass at 3:00 were also biopsied which were benign.   Today the patient notes she feels fine.  She denies any significant pain, or other new symptoms.  Due to her previous stroke, she has been using walker when she goes out, no recent falls.  She lives independently.   She has a PMHx of myasthenia gravis, she is on IV Rituxan, CellCept and prednisone. She had a stroke April 2021 She has sleep apnea, Diabetes etc.   Socially... No children no pregnancy She lives alone   REVIEW OF SYSTEMS:    Constitutional: Denies fevers, chills or abnormal night sweats Eyes: Denies blurriness of vision, double vision or watery eyes Ears, nose, mouth, throat, and face: Denies mucositis or sore throat Respiratory: Denies cough, dyspnea or wheezes Cardiovascular: Denies palpitation, chest discomfort or lower extremity swelling Gastrointestinal:  Denies nausea, heartburn or change in bowel habits Skin: Denies abnormal skin rashes Lymphatics: Denies new lymphadenopathy or easy bruising Neurological:Denies numbness, tingling or new weaknesses Behavioral/Psych: Mood is stable, no new changes  All other systems were reviewed with the patient and are negative.   MEDICAL HISTORY:  Past Medical History:  Diagnosis Date   Arthritis    "all over my body" (03/13/2013)   Asthma    Asymptomatic carotid artery stenosis, bilateral 10/08/2018   Breast cancer (HCC)    GERD (gastroesophageal reflux disease)    H/O hiatal hernia    Headache    pt states she has had headaches for  about 6 months "off and on"   Heart murmur    pt had echocardiogram on 09/17/21   Hypercholesterolemia 10/08/2018   Hypertension    Hypothyroidism    Myasthenia gravis ([place])    "in my eyes; diagnsosed > 7 yr ago" (03/13/2013)   Sleep apnea    on CPAP   Stroke ([place]) 2021   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)     SURGICAL HISTORY: Past Surgical History:  Procedure Laterality Date   ABDOMINAL HYSTERECTOMY     ANTERIOR CERVICAL DECOMP/DISCECTOMY FUSION     "I've had severa ORs; always went in from the front" (03/13/2013)   [place]     "several" (03/13/2013)   CARPAL TUNNEL RELEASE Right    CATARACT EXTRACTION W/ INTRAOCULAR LENS  IMPLANT, BILATERAL Bilateral    CHOLECYSTECTOMY     KNEE ARTHROSCOPY Left    SHOULDER ARTHROSCOPY W/ ROTATOR CUFF REPAIR Left    TONSILLECTOMY     TOTAL THYROIDECTOMY     TRANSCAROTID ARTERY REVASCULARIZATION  Left 09/24/2021   Procedure: LEFT TRANSCAROTID ARTERY REVASCULARIZATION;  Surgeon: Cherre Robins, MD;  Location: [facility];  Service: Vascular;  Laterality: Left;   TRANSCAROTID ARTERY REVASCULARIZATION  Right 10/23/2021  Procedure: Right Transcarotid Artery Revascularization;  Surgeon: Cherre Robins, MD;  Location: [facility] OR;  Service: Vascular;  Laterality: Right;   ULTRASOUND GUIDANCE FOR VASCULAR ACCESS Right 09/24/2021   Procedure: ULTRASOUND GUIDANCE FOR VASCULAR ACCESS;  Surgeon: Cherre Robins, MD;  Location[facility];  Service: Vascular;  Laterality: Right;   ULTRASOUND GUIDANCE FOR VASCULAR ACCESS Right 10/23/2021   Procedure: ULTRASOUND GUIDANCE FOR VASCULAR ACCESS, LEFT FEMORAL VEIN;  Surgeon: Cherre Robins, MD;  Location: [facility] OR;  Service: Vascular;  Laterality: Right;    SOCIAL HISTORY: Social History   Socioeconomic History   Marital status: Single    Spouse name: Not on file   Number of children: 0   Years of education: college   Highest education level: Not on file   Occupational History   Occupation: retired  Tobacco Use   Smoking status: Never   Smokeless tobacco: Never  Vaping Use   Vaping Use: Never used  Substance and Sexual Activity   Alcohol use: No    Alcohol/week: 0.0 standard drinks of alcohol   Drug use: No   Sexual activity: Never  Other Topics Concern   Not on file  Social History Narrative   Lives alone   Right handed   Drinks no caffeine   Social Determinants of Health   Financial Resource Strain: Low Risk  (08/12/2022)   Overall Financial Resource Strain (CARDIA)    Difficulty of Paying Living Expenses: Not very hard  Food Insecurity: No Food Insecurity (08/12/2022)   Hunger Vital Sign    Worried About Running Out of Food in the Last Year: Never true    Ran Out of Food in the Last Year: Never true  Transportation Needs: No Transportation Needs (08/12/2022)   PRAPARE - Hydrologist (Medical): No    Lack of Transportation (Non-Medical): No  Physical Activity: Not on file  Stress: Not on file  Social Connections: Not on file  Intimate Partner Violence: Not on file    FAMILY HISTORY: Family History  Problem Relation Age of Onset   Coronary artery disease Sister        s/p coronary stenting   Stroke Sister 68   Diabetes Sister    Congestive Heart Failure Mother    Asthma Mother    Diabetes Mother    Congestive Heart Failure Father    Lung cancer Father    Asthma Father    Diabetes Father    Diabetes Brother     ALLERGIES:  is allergic to contrast media [iodinated contrast media], fluorescein, iodine, molds & smuts, shellfish-derived products, azithromycin, codeine, metformin hcl er, tramadol hcl, and other.  MEDICATIONS:  Current Outpatient Medications  Medication Sig Dispense Refill   acetaminophen (TYLENOL) 500 MG tablet Take 500 mg by mouth every 6 (six) hours as needed (pain).     ALPRAZolam (XANAX) 0.25 MG tablet Take 1 tablet (0.25 mg total) by mouth at bedtime as needed  for anxiety. 2 tablet 0   aspirin EC 81 MG EC tablet Take 1 tablet (81 mg total) by mouth daily.     Biotin 5000 MCG CAPS Take 5,000 mcg by mouth daily with breakfast.      cycloSPORINE (RESTASIS) 0.05 % ophthalmic emulsion Place 1 drop into both eyes 2 (two) times daily. 0.4 mL 0   diclofenac Sodium (VOLTAREN) 1 % GEL Apply 2 g topically 4 (four) times daily. (Patient taking differently: Apply 1 application  topically daily as needed (pain).) 2  g 0   diphenhydrAMINE 25 mg in sodium chloride 0.9 % 50 mL Inject 25 mg into the vein See admin instructions. Administer 25 mg intravenously at start of Gamunex infusion, then administer 25 mg during infusion     Elderberry 500 MG CAPS Take 500 mg by mouth daily.     empagliflozin (JARDIANCE) 25 MG TABS tablet Take 25 mg by mouth daily.     esomeprazole (NEXIUM) 40 MG capsule Take 1 capsule (40 mg total) by mouth 2 (two) times daily. 60 capsule 0   Exenatide ER (BYDUREON BCISE) 2 MG/0.85ML AUIJ Inject 2 mg into the skin every Monday.     ezetimibe (ZETIA) 10 MG tablet TAKE 1 TABLET BY MOUTH EVERY DAY (Patient taking differently: Take 10 mg by mouth daily.) 90 tablet 1   furosemide (LASIX) 40 MG tablet Take 40 mg by mouth.     IBUPROFEN PO Take by mouth.     Immune Globulin, Human, 40 GM/400ML SOLN Infusions are administered at home by home health nurse - last infusion 09/08/21 and 09/09/21; next infusion due 10/19/21 and 10/20/21     Insulin Disposable Pump (OMNIPOD DASH 5 PACK PODS) MISC Use with Humalog     levothyroxine (SYNTHROID) 150 MCG tablet Take 150 mcg by mouth daily before breakfast.     lidocaine (LIDODERM) 5 % Place 1 patch onto the skin daily. Remove & Discard patch within 12 hours or as directed by MD (Patient taking differently: Place 1 patch onto the skin daily as needed. Remove & Discard patch within 12 hours or as directed by MD) 30 patch 0   loratadine (CLARITIN) 10 MG tablet Take 1 tablet (10 mg total) by mouth at bedtime. 30 tablet 0    meclizine (ANTIVERT) 25 MG tablet Take 25 mg by mouth 3 (three) times daily as needed for dizziness (or migraine-related nausea).      montelukast (SINGULAIR) 10 MG tablet Take 1 tablet (10 mg total) by mouth every morning. 30 tablet 0   mycophenolate (CELLCEPT) 500 MG tablet Take 1,500 mg by mouth 2 (two) times daily. For myasthenia gravis     olmesartan (BENICAR) 20 MG tablet Take 1 tablet (20 mg total) by mouth daily. 30 tablet 0   predniSONE (DELTASONE) 10 MG tablet Take 7.5 mg by mouth every morning. For myasthenia gravis     PRESCRIPTION MEDICATION Inhale into the lungs at bedtime. CPAP     PROAIR HFA 108 (90 BASE) MCG/ACT inhaler Inhale 2 puffs into the lungs every 6 (six) hours as needed for wheezing or shortness of breath.      riTUXimab (RITUXAN) 500 MG/50ML injection Inject into the vein every 6 (six) months.     rosuvastatin (CRESTOR) 40 MG tablet Take 1 tablet (40 mg total) by mouth daily. 90 tablet 3   scopolamine (TRANSDERM-SCOP) 1 MG/3DAYS Place 1 patch onto the skin every 3 (three) days.     topiramate (TOPAMAX) 50 MG tablet Take 1 tablet (50 mg total) by mouth at bedtime. 90 tablet 3   vitamin B-12 (CYANOCOBALAMIN) 1000 MCG tablet Take 1,000 mcg by mouth daily.     Vitamin D, Ergocalciferol, (DRISDOL) 50000 UNITS CAPS capsule Take 50,000 Units by mouth every Monday.      No current facility-administered medications for this visit.    PHYSICAL EXAMINATION: ECOG PERFORMANCE STATUS: 1 - Symptomatic but completely ambulatory  Vitals:   08/12/22 0906  BP: (!) 163/64  Pulse: 95  Resp: 18  Temp: 98.8 F (37.1 C)  SpO2: 98%   Filed Weights   08/12/22 0906  Weight: 200 lb (90.7 kg)    GENERAL:alert, no distress and comfortable SKIN: skin color, texture, turgor are normal, no rashes or significant lesions EYES: normal, Conjunctiva are pink and non-injected, sclera clear  NECK: supple, thyroid normal size, non-tender, without nodularity LYMPH: (-)  no palpable  lymphadenopathy in the cervical, axillary  LUNGS: clear to auscultation and percussion with normal breathing effort HEART: regular rate & rhythm and no murmurs and no lower extremity edema ABDOMEN:abdomen soft, non-tender and normal bowel sounds Musculoskeletal:no cyanosis of digits and no clubbing  NEURO: alert & oriented x 3 with fluent speech, no focal motor/sensory deficits BREAST: Right breast No palpable mass,  Left Brest nodules or adenopathy bilaterally. Breast exam benign.  LABORATORY DATA:  I have reviewed the data as listed    Latest Ref Rng & Units 08/12/2022    8:17 AM 10/24/2021    1:56 AM 10/15/2021   10:04 AM  CBC  WBC 4.0 - 10.5 K/uL 8.0  7.3  6.4   Hemoglobin 12.0 - 15.0 g/dL 10.4  7.9  10.2   Hematocrit 36.0 - 46.0 % 32.1  25.6  31.8   Platelets 150 - 400 K/uL 260  209  257        Latest Ref Rng & Units 08/12/2022    8:17 AM 10/24/2021    1:56 AM 10/15/2021   10:04 AM  CMP  Glucose 70 - 99 mg/dL 177  324  169   BUN 8 - 23 mg/dL _0 Creatinine 0.44 - 1.00 mg/dL 1.22  1.08  0.89   Sodium 135 - 145 mmol/L 141  137  143   Potassium 3.5 - 5.1 mmol/L 3.2  3.5  3.3   Chloride 98 - 111 mmol/L 112  109  110   CO2 22 - 32 mmol/L _1 Calcium 8.9 - 10.3 mg/dL 9.0  8.4  9.4   Total Protein 6.5 - 8.1 g/dL 7.1   6.8   Total Bilirubin 0.3 - 1.2 mg/dL 0.4   0.3   Alkaline Phos 38 - 126 U/L 127   130   AST 15 - 41 U/L 20   22   ALT 0 - 44 U/L 18   26      RADIOGRAPHIC STUDIES: I have personally reviewed the radiological images as listed and agreed with the findings in the report. No results found.   Orders Placed This Encounter  Procedures   DG Bone Density    Standing Status:   Future    Standing Expiration Date:   08/12/2023    Scheduling Instructions:     [facility]    Order Specific Question:   Reason for Exam (SYMPTOM  OR DIAGNOSIS REQUIRED)    Answer:   screen    Order Specific Question:   Preferred imaging location?    Answer:   External     All questions were answered. The patient knows to call the clinic with any problems, questions or concerns. The total time spent in the appointment was 60 minutes.     Truitt Merle, MD 08/12/2022   I, Audry Riles, am acting as scribe for Truitt Merle, MD.   I have reviewed the above documentation for accuracy and completeness, and I agree with the above.

## 2022-08-12 NOTE — Assessment & Plan Note (Signed)
-  She is on Rituxan, CellCept and prednisone.

## 2022-08-12 NOTE — Telephone Encounter (Signed)
[name] was seen by a genetic counselor during the breast multidisciplinary clinic on 08/12/2022. 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 She does not meet NCCN criteria for genetic testing at this time. She was still offered genetic counseling and testing but declined. We encourage her to contact us if there are any changes to her personal or family history of cancer. If she meets NCCN criteria based on the updated personal/family history, she would be recommended to have genetic counseling and testing.   [name], MS, [facility] Genetic Counselor [email or web address] (P) [phone]

## 2022-08-12 NOTE — Assessment & Plan Note (Signed)
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 -Giving the early stage disease, she is a candidate for lumpectomy and sentinel lymph node biopsy. She is agreeable with that. She was seen by Dr. Ninfa Linden today and likely will proceed with surgery soon.  -Although this is very early stage disease, her prognostic markers shows high-grade disease, weakly ER expression, negative PR, indicating more aggressive biology. -Due to her medical comorbidities, on severe immunosuppressant for myasthenia gravis, she is a poor candidate for chemotherapy.  If the tumor is less than 1 cm on surgical sample, I would not recommend further testing for Oncotype.  But if the tumor is a more than 1 cm, or if she has positive lymph nodes, we will repeat ER and PR, and decide if she needs Oncotype to predict her risk of recurrence and benefit of adjuvant chemo.  --Giving the positive ER expression ann her postmenopausal status, I recommend adjuvant endocrine therapy with aromatase inhibitor or tamoxifen for a total of 5-10 years to reduce the risk of cancer recurrence. Potential benefits and side effects were discussed with patient and she is interested. -She was also seen by radiation oncologist Dr. Lisbeth Renshaw today.  Adjuvant radiation was discussed with her.  -We also discussed the breast cancer surveillance after her surgery. She will continue annual screening mammogram, self exam, and a routine office visit with lab and exam with [place]. -I encouraged her to have healthy diet and exercise regularly.

## 2022-08-12 NOTE — Progress Notes (Signed)
[place] Clinical Social Work  Initial Assessment   [name] is a 71 y.o. year old female accompanied by sister, Pamala Hurry. Clinical Social Work was referred by  [facility]  for assessment of psychosocial needs.   SDOH (Social Determinants of Health) assessments performed: Yes SDOH Interventions    Flowsheet Row Clinical Support from 08/12/2022 in [facility]  SDOH Interventions   Food Insecurity Interventions Intervention Not Indicated  Housing Interventions Intervention Not Indicated  Transportation Interventions Intervention Not Indicated, Patient Resources (Friends/Family)  Financial Strain Interventions Intervention Not Indicated       SDOH Screenings   Food Insecurity: No Food Insecurity (08/12/2022)  Housing: Low Risk  (08/12/2022)  Transportation Needs: No Transportation Needs (08/12/2022)  Depression (PHQ2-9): Low Risk  (10/22/2020)  Financial Resource Strain: Low Risk  (08/12/2022)  Tobacco Use: Low Risk  (08/12/2022)     Distress Screen completed: Yes    08/12/2022   11:57 AM  ONCBCN DISTRESS SCREENING  Screening Type Initial Screening  Emotional problem type Adjusting to illness  Information Concerns Type Lack of info about diagnosis      Family/Social Information:  Housing Arrangement: patient lives alone Family members/support persons in your life? 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  Income source: Paediatric nurse concerns: No Type of concern: None Food access concerns: no Religious or spiritual practice: Not known Services Currently in place:    Coping/ Adjustment to diagnosis: Patient understands treatment plan and what happens next? yes, still processing, but feels better after meeting with medical team Concerns about diagnosis and/or treatment:   general adjustment Patient reported stressors: Adjusting to my illness Patient enjoys time with family/ friends Current coping skills/ strengths: Capable of independent living , Motivation for treatment/growth , and Supportive family/friends     SUMMARY: Current SDOH Barriers:  None noted today  Clinical Social Work Clinical Goal(s):  No clinical social work goals at this time  Interventions: Discussed common feeling and emotions when being diagnosed with cancer, and the importance of support during treatment Informed patient of the support team roles and support services at [facility] Provided [place] contact information and encouraged patient to call with any questions or concerns Discussed transportation benefit through insurance if needed during radiation   Follow Up Plan: Patient will contact CSW with any support or resource needs Patient verbalizes understanding of plan: Yes    Anhelica Fowers E Jillisa Harris, LCSW

## 2022-08-12 NOTE — Therapy (Signed)
OUTPATIENT PHYSICAL THERAPY BREAST CANCER BASELINE EVALUATION   Patient Name: [name] MRN: [id number] DOB:Oct 07, 1950, 71 y.o., female Today's Date: 08/12/2022  END OF SESSION:  PT End of Session - 08/12/22 1110     Visit Number 1    Number of Visits 2    Date for PT Re-Evaluation 10/07/22    PT Start Time 0925    PT Stop Time 0942   Also saw pt from 1001-1020 for a total of 36 min   PT Time Calculation (min) 17 min    Activity Tolerance Patient tolerated treatment well    Behavior During Therapy [facility] for tasks assessed/performed             Past Medical History:  Diagnosis Date   Arthritis    "all over my body" (03/13/2013)   Asthma    Asymptomatic carotid artery stenosis, bilateral 10/08/2018   Breast cancer (HCC)    GERD (gastroesophageal reflux disease)    H/O hiatal hernia    Headache    pt states she has had headaches for about 6 months "off and on"   Heart murmur    pt had echocardiogram on 09/17/21   Hypercholesterolemia 10/08/2018   Hypertension    Hypothyroidism    Myasthenia gravis ([place])    "in my eyes; diagnsosed > 7 yr ago" (03/13/2013)   Sleep apnea    on CPAP   Stroke ([place]) 2021   Thyroid carcinoma ([place])    Type II diabetes mellitus ([place])    Past Surgical History:  Procedure Laterality Date   ABDOMINAL HYSTERECTOMY     ANTERIOR CERVICAL DECOMP/DISCECTOMY FUSION     "I've had severa ORs; always went in from the front" (03/13/2013)   [place]     "several" (03/13/2013)   CARPAL TUNNEL RELEASE Right    CATARACT EXTRACTION W/ INTRAOCULAR LENS  IMPLANT, BILATERAL Bilateral    CHOLECYSTECTOMY     KNEE ARTHROSCOPY Left    SHOULDER ARTHROSCOPY W/ ROTATOR CUFF REPAIR Left    TONSILLECTOMY     TOTAL THYROIDECTOMY     TRANSCAROTID ARTERY REVASCULARIZATION  Left 09/24/2021   Procedure: LEFT TRANSCAROTID ARTERY REVASCULARIZATION;  Surgeon: Cherre Robins, MD;  Location: [facility] OR;  Service: Vascular;  Laterality: Left;    TRANSCAROTID ARTERY REVASCULARIZATION  Right 10/23/2021   Procedure: Right Transcarotid Artery Revascularization;  Surgeon: Cherre Robins, MD;  Location: [facility] OR;  Service: Vascular;  Laterality: Right;   ULTRASOUND GUIDANCE FOR VASCULAR ACCESS Right 09/24/2021   Procedure: ULTRASOUND GUIDANCE FOR VASCULAR ACCESS;  Surgeon: Cherre Robins, MD;  Location: [facility] OR;  Service: Vascular;  Laterality: Right;   ULTRASOUND GUIDANCE FOR VASCULAR ACCESS Right 10/23/2021   Procedure: ULTRASOUND GUIDANCE FOR VASCULAR ACCESS, LEFT FEMORAL VEIN;  Surgeon: Cherre Robins, MD;  Location: [facility] OR;  Service: Vascular;  Laterality: Right;   Patient Active Problem List   Diagnosis Date Noted   Malignant neoplasm of upper-outer quadrant of left breast in female, estrogen receptor positive ([place]) 08/10/2022   Asymptomatic carotid artery stenosis without infarction, right 10/23/2021   Carotid stenosis 10/23/2021   Stroke-like symptoms    TIA (transient ischemic attack)    Hypokalemia 09/17/2021   Right facial numbness 09/16/2021   Insulin dependent type 2 diabetes mellitus ([place]) 09/16/2021   Hypothyroidism 09/16/2021   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[place]) 12/27/2019   Primary hypertension    Uncomplicated asthma    Uncontrolled type 2 diabetes mellitus with hyperglycemia ([place])  Thalamic stroke ([place]) 12/20/2019   Bilateral carotid artery stenosis 10/08/2018   Hyperlipidemia associated with type 2 diabetes mellitus ([place]) 10/08/2018   Headache around the eyes 11/12/2016   Cervicalgia of occipito-atlanto-axial region 11/12/2016   Moderate persistent asthma 07/29/2015   Current use of beta blocker 07/29/2015   Gastroesophageal reflux disease without esophagitis 07/29/2015   OSA on CPAP 09/19/2014   Diabetic neuropathy with neurologic complication ([place]) 07/37/1062   Myasthenia gravis in remission ([place]) 09/19/2014   Dyspnea 08/13/2014   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) 03/13/2013   Essential hypertension, benign 03/13/2013   Asthma, chronic 03/13/2013    REFERRING PROVIDER: Dr. Coralie Keens  REFERRING DIAG: Left breast cancer  THERAPY DIAG:  Malignant neoplasm of upper-outer quadrant of right breast in female, estrogen receptor positive ([place])  Abnormal posture  Rationale for Evaluation and Treatment: Rehabilitation  ONSET DATE: 07/11/2022  SUBJECTIVE:                                                                                                                                                                                           SUBJECTIVE STATEMENT: Patient reports she is here today to be seen by her medical team for her newly diagnosed left breast cancer.   PERTINENT HISTORY:  Patient was diagnosed on 07/11/2022 with left grade 3 invasive ductal carcinoma breast cancer. It measures 7 mm and is located in the upper outer quadrant. It is ER positive, PR negative, and HER2 negative with a Ki67 of 5%. She is diabetic, has myasthenia gravis and a port for treatment for that, had a CVA in 2021, and thyroid cancer in 2005. She ambulates with a 2 wheeled walker.  PATIENT GOALS:   reduce lymphedema risk and learn post op HEP.   PAIN:  Are you having pain? No  PRECAUTIONS: Active CA Fall  HAND DOMINANCE: right  WEIGHT BEARING RESTRICTIONS: No  FALLS:  Has patient fallen in last 6 months? No  LIVING ENVIRONMENT: Patient lives with: alone  Lives in: House/apartment Has following equipment at home: Walker - 2  wheeled  OCCUPATION: Retired  LEISURE: She does not exercise  PRIOR LEVEL OF FUNCTION: Independent   OBJECTIVE:  COGNITION: Overall cognitive status: Within functional limits for tasks assessed    POSTURE:  Forward head and rounded shoulders posture  UPPER EXTREMITY AROM/PROM:  A/PROM RIGHT   eval   Shoulder extension 33  Shoulder flexion 155  Shoulder abduction 156  Shoulder internal rotation 48  Shoulder external rotation 88    (Blank rows = not tested)  A/PROM LEFT   eval  Shoulder extension 45  Shoulder flexion 132  Shoulder abduction 147  Shoulder internal rotation 48  Shoulder external rotation 75    (Blank rows = not tested)  CERVICAL AROM: All within normal limits:    Percent limited  Flexion WNL  Extension 50% limited  Right lateral flexion WNL  Left lateral flexion WNL  Right rotation 25% limited  Left rotation 50% limited    UPPER EXTREMITY STRENGTH: WFL  LYMPHEDEMA ASSESSMENTS:   LANDMARK RIGHT   eval  10 cm proximal to olecranon process 37.5  Olecranon process 25.1  10 cm proximal to ulnar styloid process 21.8  Just proximal to ulnar styloid process 15.7  Across hand at thumb web space 18.9  At base of 2nd digit 6.4  (Blank rows = not tested)  LANDMARK LEFT   eval  10 cm proximal to olecranon process 36  Olecranon process 24.6  10 cm proximal to ulnar styloid process 21.7  Just proximal to ulnar styloid process 15.5  Across hand at thumb web space 18.6  At base of 2nd digit 6.2  (Blank rows = not tested)  L-DEX LYMPHEDEMA SCREENING:  The patient was assessed using the L-Dex machine today to produce a lymphedema index baseline score. The patient will be reassessed on a regular basis (typically every 3 months) to obtain new L-Dex scores. If the score is > 6.5 points away from his/her baseline score indicating onset of subclinical lymphedema, it will be recommended to wear a compression garment for 4 weeks, 12 hours per day and  then be reassessed. If the score continues to be > 6.5 points from baseline at reassessment, we will initiate lymphedema treatment. Assessing in this manner has a 95% rate of preventing clinically significant lymphedema.   L-DEX FLOWSHEETS - 08/12/22 1100       L-DEX LYMPHEDEMA SCREENING   Measurement Type Unilateral    L-DEX MEASUREMENT EXTREMITY Upper Extremity    POSITION  Standing    DOMINANT SIDE Right    At Risk Side Left    BASELINE SCORE (UNILATERAL) 2.8             PATIENT EDUCATION:  Education details: Lymphedema risk reduction and post op shoulder/posture HEP Person educated: Patient Education method: Explanation, Demonstration, Handout Education comprehension: Patient verbalized understanding and returned demonstration  HOME EXERCISE PROGRAM: Patient was instructed today in a home exercise program today for post op shoulder range of motion. These included active assist shoulder flexion in sitting, scapular retraction, wall walking with shoulder abduction, and hands behind head external rotation.  She was encouraged to do these twice a day, holding 3 seconds and repeating 5 times when permitted by her physician.   ASSESSMENT:  CLINICAL IMPRESSION: Patient was diagnosed on 07/11/2022 with left grade 3 invasive ductal carcinoma breast cancer. It measures 7 mm and is located in the upper outer quadrant. It is ER positive, PR negative, and HER2 negative with a Ki67 of 5%. She is diabetic, has myasthenia gravis and a port for treatment for that, had a CVA in 2021, and thyroid cancer in 2005. She ambulates with  a 2 wheeled walker.Her multidisciplinary medical team met prior to her assessments to determine a recommended treatment plan. She is planning to have a left lumpectomy and sentinel node biopsy followed by possible radiation and anti-estrogen therapy. She will benefit from a post op PT reassessment to determine needs and from L-Dex screens every 3 months for 2 years to  detect subclinical lymphedema.  Pt will benefit from skilled therapeutic intervention to improve on the following deficits: Decreased knowledge of precautions, impaired UE functional use, pain, decreased ROM, postural dysfunction.   PT treatment/interventions: ADL/self-care home management, pt/family education, therapeutic exercise  REHAB POTENTIAL: Excellent  CLINICAL DECISION MAKING: Stable/uncomplicated  EVALUATION COMPLEXITY: Low   GOALS: Goals reviewed with patient? YES  LONG TERM GOALS: (STG=LTG)    Name Target Date Goal status  1 Pt will be able to verbalize understanding of pertinent lymphedema risk reduction practices relevant to her dx specifically related to skin care.  Baseline:  No knowledge 08/12/2022 Achieved at eval  2 Pt will be able to return demo and/or verbalize understanding of the post op HEP related to regaining shoulder ROM. Baseline:  No knowledge 08/12/2022 Achieved at eval  3 Pt will be able to verbalize understanding of the importance of attending the post op After Breast CA Class for further lymphedema risk reduction education and therapeutic exercise.  Baseline:  No knowledge 08/12/2022 Achieved at eval  4 Pt will demo she has regained full shoulder ROM and function post operatively compared to baselines.  Baseline: See objective measurements taken today. 10/07/2022     PLAN:  PT FREQUENCY/DURATION: EVAL and 1 follow up appointment.   PLAN FOR NEXT SESSION: will reassess 3-4 weeks post op to determine needs.   Patient will follow up at outpatient cancer rehab 3-4 weeks following surgery.  If the patient requires physical therapy at that time, a specific plan will be dictated and sent to the referring physician for approval. The patient was educated today on appropriate basic range of motion exercises to begin post operatively and the importance of attending the After Breast Cancer class following surgery.  Patient was educated today on lymphedema risk  reduction practices as it pertains to recommendations that will benefit the patient immediately following surgery.  She verbalized good understanding.    Physical Therapy Information for After Breast Cancer Surgery/Treatment:  Lymphedema is a swelling condition that you may be at risk for in your arm if you have lymph nodes removed from the armpit area.  After a sentinel node biopsy, the risk is approximately 5-9% and is higher after an axillary node dissection.  There is treatment available for this condition and it is not life-threatening.  Contact your physician or physical therapist with concerns. You may begin the 4 shoulder/posture exercises (see additional sheet) when permitted by your physician (typically a week after surgery).  If you have drains, you may need to wait until those are removed before beginning range of motion exercises.  A general recommendation is to not lift your arms above shoulder height until drains are removed.  These exercises should be done to your tolerance and gently.  This is not a "no pain/no gain" type of recovery so listen to your body and stretch into the range of motion that you can tolerate, stopping if you have pain.  If you are having immediate reconstruction, ask your plastic surgeon about doing exercises as he or she may want you to wait. We encourage you to attend the free one time ABC (After  Breast Cancer) class offered by [facility].  You will learn information related to lymphedema risk, prevention and treatment and additional exercises to regain mobility following surgery.  You can call [phone] for more information.  This is offered the 1st and 3rd Monday of each month.  You only attend the class one time. While undergoing any medical procedure or treatment, try to avoid blood pressure being taken or needle sticks from occurring on the arm on the side of cancer.   This recommendation begins after surgery and continues for the rest  of your life.  This may help reduce your risk of getting lymphedema (swelling in your arm). An excellent resource for those seeking information on lymphedema is the National Lymphedema Network's web site. It can be accessed at [place].org If you notice swelling in your hand, arm or breast at any time following surgery (even if it is many years from now), please contact your doctor or physical therapist to discuss this.  Lymphedema can be treated at any time but it is easier for you if it is treated early on.  If you feel like your shoulder motion is not returning to normal in a reasonable amount of time, please contact your surgeon or physical therapist.  [facility] [phone]. [address], [place] [address]  ABC CLASS After Breast Cancer Class  After Breast Cancer Class is a specially designed exercise class to assist you in a safe recover after having breast cancer surgery.  In this class you will learn how to get back to full function whether your drains were just removed or if you had surgery a month ago.  This one-time class is held the 1st and 3rd Monday of every month from 11:00 a.m. until 12:00 noon virtually.  This class is FREE and space is limited. For more information or to register for the next available class, call [phone].  Class Goals  Understand specific stretches to improve the flexibility of you chest and shoulder. Learn ways to safely strengthen your upper body and improve your posture. Understand the warning signs of infection and why you may be at risk for an arm infection. Learn about Lymphedema and prevention.  ** You do not attend this class until after surgery.  Drains must be removed to participate  Patient was instructed today in a home exercise program today for post op shoulder range of motion. These included active assist shoulder flexion in sitting, scapular retraction, wall walking with shoulder  abduction, and hands behind head external rotation.  She was encouraged to do these twice a day, holding 3 seconds and repeating 5 times when permitted by her physician.  Annia Friendly, [place] 08/12/22 11:18 AM

## 2022-08-14 ENCOUNTER — Inpatient Hospital Stay: Payer: Medicare Other | Admitting: Licensed Clinical Social Worker

## 2022-08-14 DIAGNOSIS — Z170 Estrogen receptor positive status [ER+]: Secondary | ICD-10-CM

## 2022-08-14 NOTE — Progress Notes (Signed)
[place] CSW Progress Note  Holiday representative  received a call from pt w/ questions regarding the timeline of treatment and what next steps should be for her.  Pt is anticipated to have surgery w/ Dr. Ninfa Linden which has not yet been scheduled.  Pt also inquired as to when she should book her appointment w/ [facility] for her bra and about insurance coverage.  CSW recommended pt contact Dr. Trevor Mace office to inquire about a time frame for surgery and then book an appointment w/ [facility] to ensure that pt is able to obtain the bra prior to surgery.  CSW informed pt [facility] will check pt's insurance coverage prior to that appointment and inform of any co-pay prior to that appointment.   CSW to remain available throughout duration of treatment as appropriate throughout duration of treatment.      Henriette Combs, LCSW

## 2022-08-18 NOTE — Progress Notes (Unsigned)
[facility] [address] [place]. [place] [address] [phone]       OFFICE FOLLOW UP NOTE  [name] Date of Birth:  [birth year] Medical Record Number:  [id number]   Reason for visit: Initial CPAP follow-up    SUBJECTIVE:   CHIEF COMPLAINT:  No chief complaint on file.   HPI:   Update 08/19/2022 JM: Patient returns for initial CPAP compliance visit and headache follow-up.  Evaluated by Dr. Brett [name] on 8/17 and underwent sleep study on 9/19 which showed mild OSA with total AHI of 9.2/h and recommend restart of AutoPap.   Headaches well-controlled on topiramate 50 mg nightly, currently experiencing *** headache days per month.   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  Overall stable.  Currently, experiencing 3-4 headaches per month. Tolerating lower dose of topiramate 50 mg nightly. Has been having greater difficulty with vision with persistent diplopia and floaters.  Received IVIG 6/16 and is closely being followed by [name] and multiple ophthalmologist. Also reports previously use CPAP for OSA, previously followed by [name] has not been able to use over the past 6 months as it has not been working correctly (received machine in 2015). She would like a new machine to get restarted as she did feel better with use.   Continues to follow with vascular surgery and underwent left transcarotid revascularization 2/1 and R TCAR 3/2 without complication.  Also routinely followed by PCP and cardiology.   No further concerns at this time  Update 08/06/2021 Dr. SLeonie [name] Patient returns for follow-up after last visit 6 months ago.  She is accompanied by her daughter.  Patient states her headaches are doing well and still occur intermittently off and on once every 2 to 3 weeks.  She is on Topamax 50 mg twice daily  but feels that the [name] of makes her sleepy and wonders if she needs to reduce the dose.  She is not had much episodes of numbness and occasionally has numbness involving her nose and right face which is not bothersome.  [facility] getting treated with IVIG infusion symptoms appear to be stable.  She has had no recurrent stroke or TIA symptoms.  She did have follow-up carotid ultrasound done last week in Dr. GIrven Shellingoffice which shows stable appearance of greater than 70% right ICA and 50 to 69% left ICA stenosis.  She has no new symptoms.  She wants to drive.   Update 02/18/2021 Dr. SLeonie [name] She returns for follow-up after last visit 3 months ago.  Patient states she has noticed some improvement in her headaches and numbness when she takes Topamax but she is not taking it on a scheduled basis and takes it only as needed.  Symptoms still persist but not as bothersome.  She has a new complaint of pain and swelling in the left eye and she has seen an ophthalmologist with diagnosed her with a stye.  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  LDL cholesterol on 01/01/2021 was 133 and Dr. GEinar Gipincrease the dose of Crestor which she now takes at 40 mg daily.  She also had carotid ultrasound on 01/07/2021 which showed 70% right ICA and 50 to 69% left ICA stenosis.  She also is complaining of right hand pain and is wearing a carpal tunnel splint.  She has been advised surgery but she is not happy with the surgeon and is thinking about a second opinion.   Initial visit 10/29/2020 [name] is a 71 year old African-American lady seen today for office consultation visit.  History is obtained from the patient, review of electronic medical records as well as care everywhere and I personally reviewed pertinent imaging films in PACS.  She has past medical history of  hypertension, hyperlipidemia, obesity, asthma, hypothyroidism, seronegative ocular myasthenia, sleep apnea, thyroid carcinoma, diabetes.  Patient states that she has had new onset of numbness involving the right face for the last few months.  This is intermittent and can last for several minutes to hours and can occur at variable frequency couple of times a week to once every couple of weeks.  She is also noticed for the last several weeks headaches which is sharp shooting severe lasting few seconds involving mostly the frontal and occasionally occipital regions.  These also occur to 3 times a day.  She is been taking some Tylenol which seems to help.  There is occasional sweating with these headaches but she denies any tearing of her eyes or redness accompanying the headaches.  Patient has tried taking gabapentin for the numbness which has not helped.  She is presently on 300 mg 3 times daily which was recently increased.  She has actually history of left thalamic lacunar infarct in April 2021 at that time she did have some right face paresthesias and numbness which actually resolved several months after till they recurred again recently.  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  She has been following up with [name] neurologist at [name] and is presently on CellCept 1500 mg twice daily and prednisone 10 mg daily.  She still has some mild ptosis and intermittent diplopia but symptoms have been stable for quite some time.  She does have an appointment to see [name] in June.  She denies any recurrent stroke or TIA symptoms.  She is on aspirin which is tolerating well without bruising or bleeding.  She states her sugars are now better controlled and she is current only on insulin pump and uses her insulin sensor and has an upcoming appointment at the end of the month with endocrinologist [name] and will have lipid profile and A1c checked at that  visit.  She states her blood pressure is usually runs pretty good though today it is elevated at 170/76 as she was late for the appointment and she could not find her office.  She denies any loss of vision with her recent new headaches or muscle aches or pains or jaw claudication or scalp tenderness.     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[place])    "in my eyes; diagnsosed > 7 yr ago" (03/13/2013)   Sleep apnea    on CPAP   Stroke ([place]) 2021   Thyroid carcinoma (HCC)    Type II diabetes mellitus (HCC)     PSH:  Past Surgical History:  Procedure Laterality Date   ABDOMINAL HYSTERECTOMY     ANTERIOR CERVICAL DECOMP/DISCECTOMY FUSION     "I've had severa ORs; always went in from the front" (03/13/2013)   [place]     "several" (03/13/2013)   CARPAL TUNNEL RELEASE Right    CATARACT EXTRACTION W/ INTRAOCULAR LENS  IMPLANT, BILATERAL Bilateral    CHOLECYSTECTOMY     KNEE ARTHROSCOPY Left    SHOULDER ARTHROSCOPY W/ ROTATOR CUFF REPAIR Left    TONSILLECTOMY     TOTAL THYROIDECTOMY     TRANSCAROTID ARTERY REVASCULARIZATION  Left 09/24/2021   Procedure: LEFT TRANSCAROTID ARTERY REVASCULARIZATION;  Surgeon: Cherre Robins, MD;  Location: [facility] OR;  Service: Vascular;  Laterality: Left;   TRANSCAROTID ARTERY REVASCULARIZATION  Right 10/23/2021   Procedure: Right Transcarotid Artery Revascularization;  Surgeon: Cherre Robins, MD;   [facility];  Service: Vascular;  Laterality: Right;   ULTRASOUND GUIDANCE FOR VASCULAR ACCESS Right 09/24/2021   Procedure: ULTRASOUND GUIDANCE FOR VASCULAR ACCESS;  Surgeon: Cherre Robins, MD;  Location: [facility] OR;  Service: Vascular;  Laterality: Right;   ULTRASOUND GUIDANCE FOR VASCULAR ACCESS Right 10/23/2021   Procedure: ULTRASOUND GUIDANCE FOR VASCULAR ACCESS, LEFT FEMORAL VEIN;  Surgeon: Cherre Robins, MD;  Location: [facility] OR;  Service: Vascular;  Laterality: Right;    Social History:  Social History   Socioeconomic History   Marital status: Single    Spouse name: Not on file   Number of children: 0   Years of education: college   Highest education level: Not on file  Occupational History   Occupation: retired  Tobacco Use   Smoking status: Never   Smokeless tobacco: Never  Vaping Use   Vaping Use: Never used  Substance and Sexual Activity   Alcohol use: No    Alcohol/week: 0.0 standard drinks of alcohol   Drug use: No   Sexual activity: Never  Other Topics Concern   Not on file  Social History Narrative   Lives alone   Right handed   Drinks no caffeine   Social Determinants of Health   Financial Resource Strain: Low Risk  (08/12/2022)   Overall Financial Resource Strain (CARDIA)    Difficulty of Paying Living Expenses: Not very hard  Food Insecurity: No Food Insecurity (08/12/2022)   Hunger Vital Sign    Worried About Running Out of Food in the Last Year: Never true    [place] in the Last Year: Never true  Transportation Needs: No Transportation Needs (08/12/2022)   PRAPARE - Hydrologist (Medical): No    Lack of Transportation (Non-Medical): No  Physical Activity: Not on file  Stress: Not on file  Social Connections: Not on file  Intimate Partner Violence: Not on file    Family History:  Family History  Problem Relation Age of Onset   Coronary artery disease Sister        s/p coronary stenting   Stroke Sister 60    Diabetes Sister    Congestive Heart Failure Mother    Asthma Mother    Diabetes Mother    Congestive Heart Failure Father  Lung cancer Father    Asthma Father    Diabetes Father    Diabetes Brother     Medications:   Current Outpatient Medications on File Prior to Visit  Medication Sig Dispense Refill   acetaminophen (TYLENOL) 500 MG tablet Take 500 mg by mouth every 6 (six) hours as needed (pain).     ALPRAZolam (XANAX) 0.25 MG tablet Take 1 tablet (0.25 mg total) by mouth at bedtime as needed for anxiety. 2 tablet 0   aspirin EC 81 MG EC tablet Take 1 tablet (81 mg total) by mouth daily.     Biotin 5000 MCG CAPS Take 5,000 mcg by mouth daily with breakfast.      cycloSPORINE (RESTASIS) 0.05 % ophthalmic emulsion Place 1 drop into both eyes 2 (two) times daily. 0.4 mL 0   diclofenac Sodium (VOLTAREN) 1 % GEL Apply 2 g topically 4 (four) times daily. (Patient taking differently: Apply 1 application  topically daily as needed (pain).) 2 g 0   diphenhydrAMINE 25 mg in sodium chloride 0.9 % 50 mL Inject 25 mg into the vein See admin instructions. Administer 25 mg intravenously at start of Gamunex infusion, then administer 25 mg during infusion     Elderberry 500 MG CAPS Take 500 mg by mouth daily.     empagliflozin (JARDIANCE) 25 MG TABS tablet Take 25 mg by mouth daily.     esomeprazole (NEXIUM) 40 MG capsule Take 1 capsule (40 mg total) by mouth 2 (two) times daily. 60 capsule 0   Exenatide ER (BYDUREON BCISE) 2 MG/0.85ML AUIJ Inject 2 mg into the skin every Monday.     ezetimibe (ZETIA) 10 MG tablet TAKE 1 TABLET BY MOUTH EVERY DAY (Patient taking differently: Take 10 mg by mouth daily.) 90 tablet 1   furosemide (LASIX) 40 MG tablet Take 40 mg by mouth.     IBUPROFEN PO Take by mouth.     Immune Globulin, Human, 40 GM/400ML SOLN Infusions are administered at home by home health nurse - last infusion 09/08/21 and 09/09/21; next infusion due 10/19/21 and 10/20/21     Insulin Disposable  Pump (OMNIPOD DASH 5 PACK PODS) MISC Use with Humalog     levothyroxine (SYNTHROID) 150 MCG tablet Take 150 mcg by mouth daily before breakfast.     lidocaine (LIDODERM) 5 % Place 1 patch onto the skin daily. Remove & Discard patch within 12 hours or as directed by MD (Patient taking differently: Place 1 patch onto the skin daily as needed. Remove & Discard patch within 12 hours or as directed by MD) 30 patch 0   loratadine (CLARITIN) 10 MG tablet Take 1 tablet (10 mg total) by mouth at bedtime. 30 tablet 0   meclizine (ANTIVERT) 25 MG tablet Take 25 mg by mouth 3 (three) times daily as needed for dizziness (or migraine-related nausea).      montelukast (SINGULAIR) 10 MG tablet Take 1 tablet (10 mg total) by mouth every [name]. 30 tablet 0   mycophenolate (CELLCEPT) 500 MG tablet Take 1,500 mg by mouth 2 (two) times daily. For myasthenia gravis     olmesartan (BENICAR) 20 MG tablet Take 1 tablet (20 mg total) by mouth daily. 30 tablet 0   predniSONE (DELTASONE) 10 MG tablet Take 7.5 mg by mouth every [name]. For myasthenia gravis     PRESCRIPTION MEDICATION Inhale into the lungs at bedtime. CPAP     PROAIR HFA 108 (90 BASE) MCG/ACT inhaler Inhale 2 puffs into the lungs every  6 (six) hours as needed for wheezing or shortness of breath.      riTUXimab (RITUXAN) 500 MG/50ML injection Inject into the vein every 6 (six) months.     rosuvastatin (CRESTOR) 40 MG tablet Take 1 tablet (40 mg total) by mouth daily. 90 tablet 3   scopolamine (TRANSDERM-SCOP) 1 MG/3DAYS Place 1 patch onto the skin every 3 (three) days.     topiramate (TOPAMAX) 50 MG tablet Take 1 tablet (50 mg total) by mouth at bedtime. 90 tablet 3   vitamin B-12 (CYANOCOBALAMIN) 1000 MCG tablet Take 1,000 mcg by mouth daily.     Vitamin D, Ergocalciferol, (DRISDOL) 50000 UNITS CAPS capsule Take 50,000 Units by mouth every Monday.      No current facility-administered medications on file prior to visit.    Allergies:   Allergies   Allergen Reactions   Contrast Media [Iodinated Contrast Media] Anaphylaxis    01/10/15 and 09/18/2021 --PT GIVEN 13 HR PRE MEDS FOR CT with contrast TOLERATED IV CONTRAST W/O ANY REACTION   Fluorescein Shortness Of Breath and Other (See Comments)    (Dye)   Iodine Anaphylaxis    Contrast dye - iodine   Molds & Smuts Shortness Of Breath and Other (See Comments)    Congestion and wheezing, also   Shellfish-Derived Products Anaphylaxis, Shortness Of Breath, Swelling and Other (See Comments)    Welts, also   Azithromycin Nausea Only   Codeine Hives and Nausea Only   Metformin Hcl Er Nausea Only   Tramadol Hcl Nausea Only   Other Rash and Other (See Comments)    Coban causes welts, also      OBJECTIVE:  Physical Exam  There were no vitals filed for this visit. There is no height or weight on file to calculate BMI. No results found.   General: well developed, well nourished, seated, in no evident distress Head: head normocephalic and atraumatic.   Neck: supple with no carotid or supraclavicular bruits Cardiovascular: regular rate and rhythm, no murmurs Musculoskeletal: no deformity Skin:  no rash/petichiae Vascular:  Normal pulses all extremities   Neurologic Exam Mental Status: Awake and fully alert. Oriented to place and time. Recent and remote memory intact. Attention span, concentration and fund of knowledge appropriate. Mood and affect appropriate.  Cranial Nerves: Pupils equal, briskly reactive to light. Extraocular movements full without nystagmus. Visual fields full to confrontation. Hearing intact. Facial sensation intact. Face, tongue, palate moves normally and symmetrically.  Extraocular movements full without nystagmus but complains of persistent diplopia with eye movement.  Bilateral resting ptosis  Motor: Normal bulk and tone. Normal strength in all tested extremity muscles Sensory.: intact to touch , pinprick , position and vibratory sensation.  Coordination:  Rapid alternating movements normal in all extremities. Finger-to-nose and heel-to-shin performed accurately bilaterally. Gait and Station: Arises from chair without difficulty. Stance is normal. Gait demonstrates normal stride length and balance without use of AD. Tandem walk and heel toe without difficulty.  Reflexes: 1+ and symmetric. Toes downgoing.         ASSESSMENT/PLAN: SONDOS WOLFMAN is a 71 y.o. 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  Discussed continued nightly usage with ensuring greater than 4  hours nightly for optimal benefit and per insurance purposes.  Continue to follow with DME company for any needed supplies or CPAP related concerns Chronic headaches: Well-controlled on topiramate 50 mg nightly, will continue Ocular myasthenia: Continue to follow with [name] Carotid stenosis s/p b/l TCAR: Continue to follow with vascular surgery      Follow up in *** or call earlier if needed   CC:  PCP: [name] Morning, DO    I spent *** minutes of face-to-face and non-face-to-face time with patient.  This included previsit chart review, lab review, study review, order entry, electronic health record documentation, patient education regarding diagnosis of sleep apnea with review and discussion of compliance report and answered all other questions to patient's satisfaction   [name], [name]  [name] [address] [place], [place] [address]  Phone [phone] Fax [phone] Note: This document was prepared with digital dictation and possible smart phrase technology. Any transcriptional errors that result from  this process are unintentional.

## 2022-08-19 ENCOUNTER — Telehealth: Payer: Self-pay | Admitting: Adult Health

## 2022-08-19 ENCOUNTER — Encounter: Payer: Self-pay | Admitting: Adult Health

## 2022-08-19 ENCOUNTER — Ambulatory Visit (INDEPENDENT_AMBULATORY_CARE_PROVIDER_SITE_OTHER): Payer: Medicare Other | Admitting: Adult Health

## 2022-08-19 VITALS — BP 173/80 | HR 79 | Ht 63.0 in | Wt 197.6 lb

## 2022-08-19 DIAGNOSIS — G4733 Obstructive sleep apnea (adult) (pediatric): Secondary | ICD-10-CM

## 2022-08-19 NOTE — Telephone Encounter (Signed)
Pt said have questions about using the CPAP. Would like a call back.

## 2022-08-19 NOTE — Telephone Encounter (Signed)
Called patient back, she had questions regarding her upcoming surgery and compliance with her machine being affected because she is going to use a hospital machine rather than her own. She was asking about her insurance and wanted to make sure it wouldn't be affected by the time away. I advised her to the best of my ability and said that we would have documentation of the surgery butI would also send [facility] message with DME number for further questions.

## 2022-08-19 NOTE — Patient Instructions (Signed)
Continue nightly use of CPAP for sleep apnea management  If you continue to have issues with the mask, please contact [employer] for further assistance  Continue to follow with [employer] for any needed supplies or CPAP related concerns  Continue topiramate 50 mg nightly for headache prevention    Follow-up in 1 year or call earlier if needed

## 2022-08-20 ENCOUNTER — Encounter: Payer: Self-pay | Admitting: *Deleted

## 2022-08-20 ENCOUNTER — Telehealth: Payer: Self-pay | Admitting: *Deleted

## 2022-08-20 ENCOUNTER — Telehealth: Payer: Self-pay | Admitting: Radiation Oncology

## 2022-08-20 DIAGNOSIS — Z170 Estrogen receptor positive status [ER+]: Secondary | ICD-10-CM

## 2022-08-20 NOTE — Telephone Encounter (Signed)
Spoke to pt concerning [place] from 08/12/22. Denies questions or concerns regarding dx or treatment care plan. Confirmed future appts as well as informed will receive a call from Dr. Ida Rogue office to be seen 4 wks after sx. Received verbal understanding. Encourage pt to call with needs. Contact information provided.

## 2022-08-20 NOTE — Telephone Encounter (Signed)
12/28 @ 1:25 pm Left voicemail for patient to call our office to be schedule for consult.

## 2022-08-25 DIAGNOSIS — G4733 Obstructive sleep apnea (adult) (pediatric): Secondary | ICD-10-CM | POA: Diagnosis not present

## 2022-08-27 ENCOUNTER — Other Ambulatory Visit: Payer: Self-pay | Admitting: Radiation Oncology

## 2022-08-27 ENCOUNTER — Inpatient Hospital Stay
Admission: RE | Admit: 2022-08-27 | Discharge: 2022-08-27 | Disposition: A | Discharge status: Home / Self Care | Payer: Self-pay | Source: Ambulatory Visit | Attending: Radiation Oncology | Admitting: Radiation Oncology

## 2022-08-27 ENCOUNTER — Ambulatory Visit
Admission: RE | Admit: 2022-08-27 | Discharge: 2022-08-27 | Disposition: A | Discharge status: Home / Self Care | Payer: Self-pay | Source: Ambulatory Visit | Attending: Radiation Oncology | Admitting: Radiation Oncology

## 2022-08-27 DIAGNOSIS — Z170 Estrogen receptor positive status [ER+]: Secondary | ICD-10-CM

## 2022-08-27 NOTE — Progress Notes (Signed)
Surgical Instructions    Your procedure is scheduled on Wednesday 09/02/22.   Report to [facility] Main Entrance "A" at 1:30 P.M., then check in with the Admitting office.  Call this number if you have problems the morning of surgery:  [phone]   If you have any questions prior to your surgery date call [phone]: Open Monday-Friday 8am-4pm If you experience any cold or flu symptoms such as cough, fever, chills, shortness of breath, etc. between now and your scheduled surgery, please notify us at the above number     Remember:  Do not eat after midnight the night before your surgery  You may drink clear liquids until 12:30 P.M. the morning of your surgery.   Clear liquids allowed are: Water, Non-Citrus Juices (without pulp), Carbonated Beverages, Clear Tea, Black Coffee ONLY (NO MILK, CREAM OR POWDERED CREAMER of any kind), and Gatorade  Patient Instructions  The night before surgery:  No food after midnight. ONLY clear liquids after midnight  The day of surgery (if you have diabetes): Drink ONE (1) 12 oz G2 given to you in your pre admission testing appointment by 12:30 P.M. the morning of surgery. Drink in one sitting. Do not sip.  This drink was given to you during your hospital  pre-op appointment visit.  Nothing else to drink after completing the  12 oz bottle of G2.         If you have questions, please contact your surgeon's office.     Take these medicines the morning of surgery with A SIP OF WATER:   cycloSPORINE (RESTASIS)   esomeprazole (NEXIUM)   ezetimibe (ZETIA)   levothyroxine (SYNTHROID)   montelukast (SINGULAIR)   predniSONE (DELTASONE)   rosuvastatin (CRESTOR)     Take these medicines if needed:  acetaminophen (TYLENOL)  meclizine (ANTIVERT)  PROAIR HFA 108 (90 BASE) MCG/ACT inhaler   Please follow your surgeon's instructions regarding mycophenolate (CELLCEPT). Please contact your surgeon's office if you have not received instructions.   As  of today, STOP taking any Aspirin (unless otherwise instructed by your surgeon) Aleve, Naproxen, Ibuprofen, Motrin, Advil, Goody's, BC's, all herbal medications, fish oil, diclofenac Sodium (VOLTAREN), and all vitamins.  WHAT DO I DO ABOUT MY DIABETES MEDICATION?   Do not take oral diabetes medicines (pills) the morning of surgery.  DO NOT TAKE Exenatide ER (BYDUREON BCISE) 7 days prior to surgery. Last dose should be on 08/24/22.   DO NOT TAKE empagliflozin (JARDIANCE) 72 hours prior to surgery. The last dose should be taken on Saturday 08/29/21.   DO NOT TAKE insulin lispro (HUMALOG) the morning of surgery.   THE MORNING OF SURGERY, take seven units of insulin degludec (TRESIBA FLEXTOUCH). This should be half of your normal dose.    The day of surgery, do not take other diabetes injectables, including Byetta (exenatide), Bydureon (exenatide ER), Victoza (liraglutide), or Trulicity (dulaglutide).  If your CBG is greater than 220 mg/dL, you may take  of your sliding scale (correction) dose of insulin.   HOW TO MANAGE YOUR DIABETES BEFORE AND AFTER SURGERY  Why is it important to control my blood sugar before and after surgery? Improving blood sugar levels before and after surgery helps healing and can limit problems. A way of improving blood sugar control is eating a healthy diet by:  Eating less sugar and carbohydrates  Increasing activity/exercise  Talking with your doctor about reaching your blood sugar goals High blood sugars (greater than 180 mg/dL) can raise your risk of infections  and slow your recovery, so you will need to focus on controlling your diabetes during the weeks before surgery. Make sure that the doctor who takes care of your diabetes knows about your planned surgery including the date and location.  How do I manage my blood sugar before surgery? Check your blood sugar at least 4 times a day, starting 2 days before surgery, to make sure that the level is not too  high or low.  Check your blood sugar the morning of your surgery when you wake up and every 2 hours until you get to the Short Stay unit.  If your blood sugar is less than 70 mg/dL, you will need to treat for low blood sugar: Do not take insulin. Treat a low blood sugar (less than 70 mg/dL) with  cup of clear juice (cranberry or apple), 4 glucose tablets, OR glucose gel. Recheck blood sugar in 15 minutes after treatment (to make sure it is greater than 70 mg/dL). If your blood sugar is not greater than 70 mg/dL on recheck, call [phone] for further instructions. Report your blood sugar to the short stay nurse when you get to Short Stay.  If you are admitted to the hospital after surgery: Your blood sugar will be checked by the staff and you will probably be given insulin after surgery (instead of oral diabetes medicines) to make sure you have good blood sugar levels. The goal for blood sugar control after surgery is 80-180 mg/dL.            Do not wear jewelry or makeup. Do not wear lotions, powders, perfumes/cologne or deodorant. Do not shave 48 hours prior to surgery.  Men may shave face and neck. Do not bring valuables to the hospital. Do not wear nail polish, gel polish, artificial nails, or any other type of covering on natural nails (fingers and toes) If you have artificial nails or gel coating that need to be removed by a nail salon, please have this removed prior to surgery. Artificial nails or gel coating may interfere with anesthesia's ability to adequately monitor your vital signs.  [facility] is not responsible for any belongings or valuables.    Do NOT Smoke (Tobacco/Vaping)  24 hours prior to your procedure  If you use a CPAP at night, you may bring your mask for your overnight stay.   Contacts, glasses, hearing aids, dentures or partials may not be worn into surgery, please bring cases for these belongings   For patients admitted to the hospital, discharge time will  be determined by your treatment team.   Patients discharged the day of surgery will not be allowed to drive home, and someone needs to stay with them for 24 hours.   SURGICAL WAITING ROOM VISITATION Patients having surgery or a procedure may have no more than 2 support people in the waiting area - these visitors may rotate.   Children under the age of 77 must have an adult with them who is not the patient. If the patient needs to stay at the hospital during part of their recovery, the visitor guidelines for inpatient rooms apply. Pre-op nurse will coordinate an appropriate time for 1 support person to accompany patient in pre-op.  This support person may not rotate.   Please refer to [email or web address] for the visitor guidelines for Inpatients (after your surgery is over and you are in a regular room).    Special instructions:    Oral Hygiene is also important to reduce your risk  of infection.  Remember - BRUSH YOUR TEETH THE MORNING OF SURGERY WITH YOUR REGULAR TOOTHPASTE   [facility]- Preparing For Surgery  Before surgery, you can play an important role. Because skin is not sterile, your skin needs to be as free of germs as possible. You can reduce the number of germs on your skin by washing with CHG (chlorahexidine gluconate) Soap before surgery.  CHG is an antiseptic cleaner which kills germs and bonds with the skin to continue killing germs even after washing.     Please do not use if you have an allergy to CHG or antibacterial soaps. If your skin becomes reddened/irritated stop using the CHG.  Do not shave (including legs and underarms) for at least 48 hours prior to first CHG shower. It is OK to shave your face.  Please follow these instructions carefully.     Shower the NIGHT BEFORE SURGERY and the MORNING OF SURGERY with CHG Soap.   If you chose to wash your hair, wash your hair first as usual with your normal shampoo. After  you shampoo, rinse your hair and body thoroughly to remove the shampoo.  Then [employer] and genitals (private parts) with your normal soap and rinse thoroughly to remove soap.  After that Use CHG Soap as you would any other liquid soap. You can apply CHG directly to the skin and wash gently with a scrungie or a clean washcloth.   Apply the CHG Soap to your body ONLY FROM THE NECK DOWN.  Do not use on open wounds or open sores. Avoid contact with your eyes, ears, mouth and genitals (private parts). Wash Face and genitals (private parts)  with your normal soap.   Wash thoroughly, paying special attention to the area where your surgery will be performed.  Thoroughly rinse your body with warm water from the neck down.  DO NOT shower/wash with your normal soap after using and rinsing off the CHG Soap.  Pat yourself dry with a CLEAN TOWEL.  Wear CLEAN PAJAMAS to bed the night before surgery  Place CLEAN SHEETS on your bed the night before your surgery  DO NOT SLEEP WITH PETS.   Day of Surgery:  Take a shower with CHG soap. Wear Clean/Comfortable clothing the morning of surgery Do not apply any deodorants/lotions.   Remember to brush your teeth WITH YOUR REGULAR TOOTHPASTE.    If you received a COVID test during your pre-op visit, it is requested that you wear a mask when out in public, stay away from anyone that may not be feeling well, and notify your surgeon if you develop symptoms. If you have been in contact with anyone that has tested positive in the last 10 days, please notify your surgeon.    Please read over the following fact sheets that you were given.  We

## 2022-08-28 ENCOUNTER — Encounter (HOSPITAL_COMMUNITY): Payer: Self-pay

## 2022-08-28 ENCOUNTER — Encounter (HOSPITAL_COMMUNITY)
Admission: RE | Admit: 2022-08-28 | Discharge: 2022-08-28 | Disposition: A | Discharge status: Home / Self Care | Payer: Medicare Other | Source: Ambulatory Visit | Attending: Surgery | Admitting: Surgery

## 2022-08-28 VITALS — BP 142/84 | HR 102 | Temp 98.1°F | Resp 18 | Ht 63.0 in | Wt 199.4 lb

## 2022-08-28 DIAGNOSIS — G7000 Myasthenia gravis without (acute) exacerbation: Secondary | ICD-10-CM | POA: Insufficient documentation

## 2022-08-28 DIAGNOSIS — E119 Type 2 diabetes mellitus without complications: Secondary | ICD-10-CM | POA: Insufficient documentation

## 2022-08-28 DIAGNOSIS — Z01818 Encounter for other preprocedural examination: Secondary | ICD-10-CM

## 2022-08-28 DIAGNOSIS — E7800 Pure hypercholesterolemia, unspecified: Secondary | ICD-10-CM | POA: Insufficient documentation

## 2022-08-28 DIAGNOSIS — J45909 Unspecified asthma, uncomplicated: Secondary | ICD-10-CM | POA: Diagnosis not present

## 2022-08-28 DIAGNOSIS — Z853 Personal history of malignant neoplasm of breast: Secondary | ICD-10-CM | POA: Insufficient documentation

## 2022-08-28 DIAGNOSIS — Z01812 Encounter for preprocedural laboratory examination: Secondary | ICD-10-CM | POA: Insufficient documentation

## 2022-08-28 DIAGNOSIS — I10 Essential (primary) hypertension: Secondary | ICD-10-CM | POA: Diagnosis not present

## 2022-08-28 DIAGNOSIS — C50912 Malignant neoplasm of unspecified site of left female breast: Secondary | ICD-10-CM | POA: Insufficient documentation

## 2022-08-28 DIAGNOSIS — Z8673 Personal history of transient ischemic attack (TIA), and cerebral infarction without residual deficits: Secondary | ICD-10-CM | POA: Diagnosis not present

## 2022-08-28 DIAGNOSIS — K219 Gastro-esophageal reflux disease without esophagitis: Secondary | ICD-10-CM | POA: Insufficient documentation

## 2022-08-28 LAB — GLUCOSE, CAPILLARY: Glucose-Capillary: 225 mg/dL — ABNORMAL HIGH (ref 70–99)

## 2022-08-28 NOTE — Progress Notes (Signed)
Surgical Instructions    Your procedure is scheduled on Wednesday, January, 10, 2024.   Report to [facility] Main Entrance "A" at 1:30 P.M., then check in with the Admitting office.  Call this number if you have problems the morning of surgery:  [phone]   If you have any questions prior to your surgery date call [phone]: Open Monday-Friday 8am-4pm If you experience any cold or flu symptoms such as cough, fever, chills, shortness of breath, etc. between now and your scheduled surgery, please notify us at the above number     Remember:  Do not eat after midnight the night before your surgery  You may drink clear liquids until 12:30 P.M. the afternoon of your surgery.   Clear liquids allowed are: Water, Non-Citrus Juices (without pulp), Carbonated Beverages, Clear Tea, Black Coffee ONLY (NO MILK, CREAM OR POWDERED CREAMER of any kind), and Gatorade  Patient Instructions  The night before surgery:  No food after midnight. ONLY clear liquids after midnight  The day of surgery (if you have diabetes): Drink ONE (1) 12 oz G2 given to you in your pre admission testing appointment by 12:30 P.M. the afternoon of surgery. Drink in one sitting. Do not sip.  This drink was given to you during your hospital  pre-op appointment visit.  Nothing else to drink after completing the  12 oz bottle of G2.         If you have questions, please contact your surgeon's office.     Take these medicines the morning of surgery with A SIP OF WATER:   cycloSPORINE (RESTASIS)   esomeprazole (NEXIUM)   ezetimibe (ZETIA)   levothyroxine (SYNTHROID)   montelukast (SINGULAIR)   predniSONE (DELTASONE)   rosuvastatin (CRESTOR)     Take these medicines if needed:  acetaminophen (TYLENOL)  meclizine (ANTIVERT)  PROAIR HFA 108 (90 BASE) MCG/ACT inhaler   Please follow your surgeon's instructions regarding mycophenolate (CELLCEPT). Please contact your surgeon's office if you have not received  instructions.   As of today, STOP taking any Aspirin (unless otherwise instructed by your surgeon) Aleve, Naproxen, Ibuprofen, Motrin, Advil, Goody's, BC's, all herbal medications, fish oil, diclofenac Sodium (VOLTAREN), and all vitamins.  WHAT DO I DO ABOUT MY DIABETES MEDICATION?   Do not take oral diabetes medicines (pills) the morning of surgery.  DO NOT TAKE Exenatide ER (BYDUREON BCISE) 7 days prior to surgery. Last dose should be on 08/24/22.   DO NOT TAKE empagliflozin (JARDIANCE) 72 hours prior to surgery. The last dose should be taken on Saturday 08/29/21.   THE MORNING OF SURGERY, take seven units of insulin degludec (TRESIBA FLEXTOUCH). This should be half of your normal dose.   If your CBG is greater than 220 mg/dL, you may take  of your sliding scale (correction) dose of insulin.  Patients with Insulin Pumps  For patients with Insulin Pumps: Contact your diabetes doctor for specific instructions before surgery. Decrease basal insulin rates by 20% at midnight the night before surgery. Do not remove your insulin pump prior to arrival to short stay the morning of surgery.  Anesthesia will instruct you on when to remove your pump. Note that if your surgery is planned to be longer than 2 hours, your insulin pump will be removed and intravenous (IV) insulin will be started and managed by the nurses and anesthesiologist. You will be able to restart your insulin pump once you are awake and able to manage it. Make sure to bring insulin pump supplies to  the hospital with you in case your site needs to be changed.    HOW TO MANAGE YOUR DIABETES BEFORE AND AFTER SURGERY  Why is it important to control my blood sugar before and after surgery? Improving blood sugar levels before and after surgery helps healing and can limit problems. A way of improving blood sugar control is eating a healthy diet by:  Eating less sugar and carbohydrates  Increasing activity/exercise  Talking with your  doctor about reaching your blood sugar goals High blood sugars (greater than 180 mg/dL) can raise your risk of infections and slow your recovery, so you will need to focus on controlling your diabetes during the weeks before surgery. Make sure that the doctor who takes care of your diabetes knows about your planned surgery including the date and location.  How do I manage my blood sugar before surgery? Check your blood sugar at least 4 times a day, starting 2 days before surgery, to make sure that the level is not too high or low.  Check your blood sugar the morning of your surgery when you wake up and every 2 hours until you get to the Short Stay unit.  If your blood sugar is less than 70 mg/dL, you will need to treat for low blood sugar: Do not take insulin. Treat a low blood sugar (less than 70 mg/dL) with  cup of clear juice (cranberry or apple), 4 glucose tablets, OR glucose gel. Recheck blood sugar in 15 minutes after treatment (to make sure it is greater than 70 mg/dL). If your blood sugar is not greater than 70 mg/dL on recheck, call [phone] for further instructions. Report your blood sugar to the short stay nurse when you get to Short Stay.  If you are admitted to the hospital after surgery: Your blood sugar will be checked by the staff and you will probably be given insulin after surgery (instead of oral diabetes medicines) to make sure you have good blood sugar levels. The goal for blood sugar control after surgery is 80-180 mg/dL.            Do not wear jewelry or makeup. Do not wear lotions, powders, perfumes or deodorant. Do not shave 48 hours prior to surgery.   Do not bring valuables to the hospital. Do not wear nail polish, gel polish, artificial nails, or any other type of covering on natural nails (fingers and toes) If you have artificial nails or gel coating that need to be removed by a nail salon, please have this removed prior to surgery. Artificial nails or gel  coating may interfere with anesthesia's ability to adequately monitor your vital signs.  [facility] is not responsible for any belongings or valuables.    Do NOT Smoke (Tobacco/Vaping)  24 hours prior to your procedure  If you use a CPAP at night, you may bring your mask for your overnight stay.   Contacts, glasses, hearing aids, dentures or partials may not be worn into surgery, please bring cases for these belongings   For patients admitted to the hospital, discharge time will be determined by your treatment team.   Patients discharged the day of surgery will not be allowed to drive home, and someone needs to stay with them for 24 hours.   SURGICAL WAITING ROOM VISITATION Patients having surgery or a procedure may have no more than 2 support people in the waiting area - these visitors may rotate.   Children under the age of 64 must have an  adult with them who is not the patient. If the patient needs to stay at the hospital during part of their recovery, the visitor guidelines for inpatient rooms apply. Pre-op nurse will coordinate an appropriate time for 1 support person to accompany patient in pre-op.  This support person may not rotate.   Please refer to [email or web address] for the visitor guidelines for Inpatients (after your surgery is over and you are in a regular room).    Special instructions:    Oral Hygiene is also important to reduce your risk of infection.  Remember - BRUSH YOUR TEETH THE MORNING OF SURGERY WITH YOUR REGULAR TOOTHPASTE   [facility]- Preparing For Surgery  Before surgery, you can play an important role. Because skin is not sterile, your skin needs to be as free of germs as possible. You can reduce the number of germs on your skin by washing with CHG (chlorahexidine gluconate) Soap before surgery.  CHG is an antiseptic cleaner which kills germs and bonds with the skin to continue killing germs even after  washing.     Please do not use if you have an allergy to CHG or antibacterial soaps. If your skin becomes reddened/irritated stop using the CHG.  Do not shave (including legs and underarms) for at least 48 hours prior to first CHG shower. It is OK to shave your face.  Please follow these instructions carefully.     Shower the NIGHT BEFORE SURGERY and the MORNING OF SURGERY with CHG Soap.   If you chose to wash your hair, wash your hair first as usual with your normal shampoo. After you shampoo, rinse your hair and body thoroughly to remove the shampoo.  Then [employer] and genitals (private parts) with your normal soap and rinse thoroughly to remove soap.  After that Use CHG Soap as you would any other liquid soap. You can apply CHG directly to the skin and wash gently with a scrungie or a clean washcloth.   Apply the CHG Soap to your body ONLY FROM THE NECK DOWN.  Do not use on open wounds or open sores. Avoid contact with your eyes, ears, mouth and genitals (private parts). Wash Face and genitals (private parts)  with your normal soap.   Wash thoroughly, paying special attention to the area where your surgery will be performed.  Thoroughly rinse your body with warm water from the neck down.  DO NOT shower/wash with your normal soap after using and rinsing off the CHG Soap.  Pat yourself dry with a CLEAN TOWEL.  Wear CLEAN PAJAMAS to bed the night before surgery  Place CLEAN SHEETS on your bed the night before your surgery  DO NOT SLEEP WITH PETS.   Day of Surgery:  Take a shower with CHG soap. Wear Clean/Comfortable clothing the morning of surgery Do not apply any deodorants/lotions.   Remember to brush your teeth WITH YOUR REGULAR TOOTHPASTE.    If you received a COVID test during your pre-op visit, it is requested that you wear a mask when out in public, stay away from anyone that may not be feeling well, and notify your surgeon if you develop symptoms. If you have been in  contact with anyone that has tested positive in the last 10 days, please notify your surgeon.    Please read over the following fact sheets that you were given.  We

## 2022-08-28 NOTE — Progress Notes (Signed)
PCP: Dr. Theda Sers Cardiologist: Dr. Einar Gip Endocrinologist: Dr. Chalmers Cater  EKG: 10/25/21 CXR: 09/16/21 ECHO: 09/17/21 Stress Test: 06/25/2017 Cardiac Cath: 12/03/2000  Labs CBC/CMP on 08/12/22 A1C drawn today  Fasting Blood Sugar- 120's Wears continuous monitor, currently on Left Arm. Omnipod Sensor on Right arm. Insulin pump instructions provided based on Diabetes coordinator recommendations, could leave pump on during surgery since planned on being less than 2 hours. However, patient does not want to bring pump to hospital. Reports she had a bad experience the last surgery and would rather be managed without her pump in the hospital setting. Instructed pt to call Dr. Chalmers Cater for insulin instructions prior to arrival-pt plans to email MD today/call on Monday.   Patient denies shortness of breath, fever, cough, and chest pain at PAT appointment.  Patient verbalized understanding of instructions provided today at the PAT appointment.  Patient asked to review instructions at home and day of surgery.   Chart forwarded to anesthesia.

## 2022-08-29 LAB — HEMOGLOBIN A1C
Hgb A1c MFr Bld: 9 % — ABNORMAL HIGH (ref 4.8–5.6)
Mean Plasma Glucose: 212 mg/dL

## 2022-08-31 NOTE — Progress Notes (Signed)
Anesthesia Chart Review:  Case: [id number] Date/Time: 09/02/22 1515   Procedure: LEFT BREAST LUMPECTOMY WITH RADIOACTIVE SEED AND SENTINEL LYMPH NODE BIOPSY (Left: Breast)   Anesthesia type: General   Pre-op diagnosis: LEFT BREAST CANCER   Location: [facility] OR ROOM 02 / [place] [facility]   Surgeons: Coralie Keens, MD       DISCUSSION: Patient is a 72 year old female scheduled for the above procedure.   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 BMI is consistent with obesity.   She is followed by [facility] neurology, ophthalmology, and rheumatology for seronegative myasthenia gravis, predominant ocular MG. She is on prednisone 7.5 mg daily, Cellcept 1000 mg BID. Notes suggests that IVIG infusions last in November and are being delayed until February.  Rituxan infusion 07/29/22.   Her endocrinologist is Dr. Chalmers Cater. 08/28/21 A1c 9.0%, down from 9.7% on 09/17/21. She is on Jardiance 25 mg daily, Bydureon Bcise 2 mg weekly (Mondays), Tresiba 15 units Q AM, Humalog TID using Omnipod. At [name], she was advised to hold Bydureon injection 7 days prior to surgery (last dose 08/24/22) and to hold Jardiance 72 hours prior to surgery. Day of surgery adjustments to her insulin also provided per guidelines. Of note, she does not want to wear her Omnipod on the day of surgery and would rather be managed on a hospital insulin pump if needed. If Omnipod is being discontinued prior to the day of surgery  then [name] RN advised her to discuss Humalog insulin instructions with her endocrinologist. ERAS ordered, and instructions on How to Manage Your Diabetes Before and After Surgery provided.   RSL is scheduled for 09/02/21 at 10:30 AM. Anesthesia team to evaluate on the day of surgery. CBG on arrival.    VS: BP (!) 142/84   Pulse (!) 102   Temp 36.7 C   Resp 18   Ht '5\' 3"'$  (1.6 m)   Wt 90.4 kg   SpO2 99%   BMI 35.32 kg/m    PROVIDERS: [name], DO is PCP  - Adrian Prows, MD is cardiologist. Last visit 08/07/21 for follow-up HTN, HLD, and carotid artery disease. Six month follow-up planned with carotid imaging--now s/p bilateral TCAR .  Jacelyn Pi, MD is endocrinologist - Queen Slough, MD is [place] Neurologist. Last visit 03/04/22 for follow-up seronegative nonthymomatous predominantly ocular myasthenia. Prednisone decreased to 7.5 mg each morning.  Carmelina Dane, MD is [facility] ophthalmologist ([facility] - Ian Malkin, MD is rheumatologist ([facility]) - Truitt Merle, MD is HEM-ONC - Kyung Rudd, MD is RAD-ONC - Jamelle Haring, MD is vascular surgeon. Last visit 12/09/21. Plavix discontinued, continue ASA 81 mg and statin recommended. One year follow-up with imaging surveillance planned.    LABS: Latest lab results in [facility] include: Lab Results  Component Value Date   WBC 8.0 08/12/2022   HGB 10.4 (L) 08/12/2022   HCT 32.1 (L) 08/12/2022   PLT 260 08/12/2022   GLUCOSE 177 (H) 08/12/2022   ALT 18 08/12/2022   AST 20 08/12/2022   NA 141 08/12/2022   K 3.2 (L) 08/12/2022   CL 112 (H)  08/12/2022   CREATININE 1.22 (H) 08/12/2022   BUN 16 08/12/2022   CO2 22 08/12/2022   INR 1.0 10/15/2021   HGBA1C 9.0 (H) 08/28/2022    OTHER: Split night sleep study 05/12/22: IMPRESSION:  1) Sleep disordered breathing was present. Obstructive Sleep  apnea was seen at a mild degree, associated with an AHI of 9.2/h  and associated with oxygen desaturation and snoring in REM and  supine sleep.  2) Total  sleep time was reduced at 295.0 minutes.  Sleep  efficiency was severely decreased at 59.0%, due to delayed onset  of sleep.  Sleep fragmentation was noted- The majority of sleep  arousals was related to hypopneas and oxygen desaturations, much  more pronounced during REM or supine sleep.   RECOMMENDATIONS:   1)REM sleep dependent apnea and supine sleep dependent apnea are  related to weakness of the inspiratory muscle groups, and this  form of apnea has no alternative treatment options aside from PAP  therapy. I strongly recommend an autotitration CPAP to be used,  6-16 cm water pressure, 3 cm EPR and interface of patient's  choice and comfort- allowing her to sleep non-supine.   PFTs > 5 years ago.    EKG: 10/25/21: NSR   CV: US Carotid 12/09/21: Summary:  - Right Carotid: Patent stent with no evidence of stenosis.  - Left Carotid: Patent stent with no evidence of stenosis.  - Vertebrals:  Bilateral vertebral arteries demonstrate antegrade flow.  - Subclavians: Normal flow hemodynamics were seen in bilateral subclavian arteries.      Echo 09/17/21: IMPRESSIONS   1. Left ventricular ejection fraction, by estimation, is 65 to 70%. The  left ventricle has hyperdynamic function. The left ventricle has no  regional wall motion abnormalities. There is mild left ventricular  hypertrophy. Left ventricular diastolic  parameters are consistent with Grade I diastolic dysfunction (impaired  relaxation).   2. Right ventricular systolic function is normal. The right ventricular  size is normal. Tricuspid regurgitation signal is inadequate for assessing  PA pressure.   3. The mitral valve is normal in structure. No evidence of mitral valve  regurgitation. No evidence of mitral stenosis. Moderate mitral annular  calcification.   4. The aortic valve is tricuspid. Aortic valve regurgitation is not  visualized. Aortic valve sclerosis/calcification is present, without any  evidence of aortic  stenosis.   5. The inferior vena cava is normal in size with greater than 50%  respiratory variability, suggesting right atrial pressure of 3 mmHg.      Lexiscan myoview stress test 06/25/17 (per [facility]):  1. Resting EKG NSR. Stress EKG: Non diagnostic for ischemia due to pharmacologic stress testing. No ST-T changes of ischemia. 8/10 chest pain with Lexiscan infusion. 2. Myocardial perfusion study was normal, without evidence of ischemia, ejection fraction 70%. Low risk study.     Cardiac event monitor 06/05/15-06/27/15: 1. NSR 2. ST 3. No arrythmias     Cardiac cath 12/03/00: CONCLUSIONS: 1. Essentially normal coronary arteries. 2. Normal left ventricular function.    Past Medical History:  Diagnosis Date   Arthritis    "all over my body" (03/13/2013)   Asthma    Asymptomatic carotid artery stenosis, bilateral 10/08/2018   Breast cancer ([place])    GERD (gastroesophageal reflux disease)    H/O hiatal hernia    Headache    pt states she has had headaches for about 6 months "off and on"   Heart murmur    pt had echocardiogram on  09/17/21   Hypercholesterolemia 10/08/2018   Hypertension    Hypothyroidism    Myasthenia gravis ([place])    "in my eyes; diagnsosed > 7 yr ago" (03/13/2013)   Sleep apnea    on CPAP   Stroke ([place]) 2021   Thyroid carcinoma ([place])    Type II diabetes mellitus ([place])     Past Surgical History:  Procedure Laterality Date   ABDOMINAL HYSTERECTOMY     ANTERIOR CERVICAL DECOMP/DISCECTOMY FUSION     "I've had severa ORs; always went in from the front" (03/13/2013)   APPENDECTOMY     CARDIAC CATHETERIZATION     "several" (03/13/2013)   CARPAL TUNNEL RELEASE Right    CATARACT EXTRACTION W/ INTRAOCULAR LENS  IMPLANT, BILATERAL Bilateral    CHOLECYSTECTOMY     KNEE ARTHROSCOPY Left    SHOULDER ARTHROSCOPY W/ ROTATOR CUFF REPAIR Left    TONSILLECTOMY     TOTAL THYROIDECTOMY     TRANSCAROTID ARTERY REVASCULARIZATION  Left 09/24/2021    Procedure: LEFT TRANSCAROTID ARTERY REVASCULARIZATION;  Surgeon: Cherre Robins, MD;  Location: [facility] OR;  Service: Vascular;  Laterality: Left;   TRANSCAROTID ARTERY REVASCULARIZATION  Right 10/23/2021   Procedure: Right Transcarotid Artery Revascularization;  Surgeon: Cherre Robins, MD;  Location: [facility] OR;  Service: Vascular;  Laterality: Right;   ULTRASOUND GUIDANCE FOR VASCULAR ACCESS Right 09/24/2021   Procedure: ULTRASOUND GUIDANCE FOR VASCULAR ACCESS;  Surgeon: Cherre Robins, MD;  Location: [facility] OR;  Service: Vascular;  Laterality: Right;   ULTRASOUND GUIDANCE FOR VASCULAR ACCESS Right 10/23/2021   Procedure: ULTRASOUND GUIDANCE FOR VASCULAR ACCESS, LEFT FEMORAL VEIN;  Surgeon: Cherre Robins, MD;  Location: [facility] OR;  Service: Vascular;  Laterality: Right;    MEDICATIONS:  acetaminophen (TYLENOL) 500 MG tablet   ALPRAZolam (XANAX) 0.25 MG tablet   aspirin EC 81 MG EC tablet   Biotin 5000 MCG CAPS   cycloSPORINE (RESTASIS) 0.05 % ophthalmic emulsion   diclofenac Sodium (VOLTAREN) 1 % GEL   diphenhydrAMINE 25 mg in sodium chloride 0.9 % 50 mL   ELDERBERRY PO   empagliflozin (JARDIANCE) 25 MG TABS tablet   esomeprazole (NEXIUM) 40 MG capsule   Evolocumab with Infusor ([place]) 420 MG/3.5ML SOCT   Exenatide ER (BYDUREON BCISE) 2 MG/0.85ML AUIJ   ezetimibe (ZETIA) 10 MG tablet   furosemide (LASIX) 40 MG tablet   ibuprofen (ADVIL) 800 MG tablet   Immune Globulin, Human, 40 GM/400ML SOLN   insulin degludec (TRESIBA FLEXTOUCH) 100 UNIT/ML FlexTouch Pen   Insulin Disposable Pump (OMNIPOD DASH 5 PACK PODS) MISC   insulin lispro (HUMALOG) 100 UNIT/ML injection   levothyroxine (SYNTHROID) 175 MCG tablet   lidocaine (LIDODERM) 5 %   loratadine (CLARITIN) 10 MG tablet   meclizine (ANTIVERT) 25 MG tablet   montelukast (SINGULAIR) 10 MG tablet   mycophenolate (CELLCEPT) 500 MG tablet   olmesartan (BENICAR) 20 MG tablet   predniSONE (DELTASONE) 2.5 MG tablet   PRESCRIPTION  MEDICATION   PROAIR HFA 108 (90 BASE) MCG/ACT inhaler   riTUXimab (RITUXAN) 500 MG/50ML injection   rosuvastatin (CRESTOR) 40 MG tablet   topiramate (TOPAMAX) 50 MG tablet   vitamin B-12 (CYANOCOBALAMIN) 1000 MCG tablet   Vitamin D, Ergocalciferol, (DRISDOL) 50000 UNITS CAPS capsule   No current facility-administered medications for this encounter.    Myra Gianotti, PA-C Surgical Short Stay/Anesthesiology [facility] Phone [phone] [facility] Phone [phone] 08/31/2022 10:12 AM

## 2022-08-31 NOTE — Anesthesia Preprocedure Evaluation (Signed)
Anesthesia Evaluation  Patient identified by MRN, date of birth, ID band Patient awake    Reviewed: Allergy & Precautions, NPO status , Patient's Chart, lab work & pertinent test results  Airway Mallampati: I  TM Distance: >3 FB Neck ROM: Full    Dental  (+) Teeth Intact, Dental Advisory Given   Pulmonary asthma , sleep apnea    breath sounds clear to auscultation       Cardiovascular hypertension, Pt. on medications and Pt. on home beta blockers  Rhythm:Regular Rate:Normal     Neuro/Psych  Headaches TIA Neuromuscular disease CVA, Residual Symptoms    GI/Hepatic hiatal hernia,GERD  Medicated and Controlled,,  Endo/Other  diabetes, Type 2, Oral Hypoglycemic Agents, Insulin DependentHypothyroidism    Renal/GU      Musculoskeletal  (+) Arthritis ,    Abdominal   Peds  Hematology   Anesthesia Other Findings   Reproductive/Obstetrics                             Anesthesia Physical Anesthesia Plan  ASA: 3  Anesthesia Plan: General   Post-op Pain Management: Tylenol PO (pre-op)*   Induction: Intravenous  PONV Risk Score and Plan: 4 or greater and Ondansetron, Dexamethasone, Midazolam, Treatment may vary due to age or medical condition and Scopolamine patch - Pre-op  Airway Management Planned: LMA  Additional Equipment: None  Intra-op Plan:   Post-operative Plan: Extubation in OR  Informed Consent: I have reviewed the patients History and Physical, chart, labs and discussed the procedure including the risks, benefits and alternatives for the proposed anesthesia with the patient or authorized representative who has indicated his/her understanding and acceptance.     Dental advisory given  Plan Discussed with: CRNA  Anesthesia Plan Comments: ([name] note written 08/31/2022 by Myra Gianotti, PA-C.  )       Anesthesia Quick Evaluation

## 2022-09-01 NOTE — H&P (Signed)
REFERRING PHYSICIAN:  Truitt Merle, MD   PROVIDER:  Beverlee Nims, MD   MRN: [id number] DOB: 09-11-1950 DATE OF ENCOUNTER: 08/12/2022 Subjective  Chief Complaint: Breast Cancer     History of Present Illness: [name] is a 72 y.o. female who is seen today as an office consultation for evaluation of Breast Cancer  .     This is a 72 year old female with a history of asthenia gravis and previous CVA who was found on recent screening mammography to have 3 masses in her left breast.  2 of the biopsies were benign but the mass measuring 7 mm at the 2 o'clock position was positive for invasive ductal carcinoma and DCIS.  It was grade 3.  It was 40% ER positive, PR negative, HER2 negative, and had a Ki-67 of 5%.  She currently receives IVIG every 6 weeks under the care of her neurologist at [facility].  She is also on chronic prednisone.  She has had no previous problems regarding her breast.  She lives alone.  She uses CPAP.  She walks with a walker.    Review of Systems: A complete review of systems was obtained from the patient.  I have reviewed this information and discussed as appropriate with the patient.  See HPI as well for other ROS.   ROS    Medical History: Past Medical History      Past Medical History:  Diagnosis Date   Arthritis      cervical spine   Asthma without status asthmaticus     Corneal abrasion     Diabetes mellitus type 2, uncomplicated (CMS-HCC) 25 years   GERD (gastroesophageal reflux disease)     History of stroke     Hyperlipidemia     Hypertension     Paraneoplastic retinopathy     Seronegative myasthenia gravis, ocular     Sleep apnea     Thyroid cancer (CMS-HCC)      status post thyroidectomy, chemo 2 yrs ago, no radiation           Patient Active Problem List  Diagnosis   Seronegative myasthenia gravis, ocular   Thyroid cancer (CMS-HCC)   Hypertropia of left eye   Paraneoplastic retinopathy   Optic neuropathy, bilateral    History of thyroid cancer   Paraneoplastic neuropathy (CMS-HCC)   Autoimmune disorder (CMS-HCC)   Mild nonproliferative diabetic retinopathy of both eyes without macular edema associated with type 2 diabetes mellitus (CMS-HCC)   Inflammatory optic neuropathy   Bilateral optic neuropathy   Paraneoplastic retinopathy   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CMS-HCC)   Encounter for long-term (current) use of high-risk medication   Uncontrolled type 2 diabetes mellitus with hyperglycemia (CMS-HCC)   Uncomplicated asthma   Obstructive sleep apnea (adult) (pediatric)   Moderate persistent asthma   DM (diabetes mellitus) type II uncontrolled with eye manifestation   Asymptomatic carotid artery stenosis, bilateral   High risk medication use   Retinal hemorrhage of right eye   Vitreous hemorrhage of right eye (CMS-HCC)   Diarrhea   Malignant neoplasm of upper-outer quadrant of left breast in female, estrogen receptor positive    Essential hypertension, benign   Gastroesophageal reflux disease without esophagitis      Past Surgical History       Past Surgical History:  Procedure Laterality Date   PORT FOR IV TREATMENT   02/17/2019   Trascarotid Artery Revascularization Left 09/24/2021   Transcarotid artery revascularization Right 10/23/2021   CARPAL TUNNEL RELEASE  Right     CERVICAL SPINE SURGERY        secondary to [place]   ?2014    cataract surgery   THYROIDECTOMY        thyroid cancer   TONSILLECTOMY       TONSILLECTOMY            Allergies       Allergies  Allergen Reactions   Fluorescein Shortness Of Breath      dye   Iodine Anaphylaxis      Contrast dye - iodine Contrast dye - iodine   Codeine Hives and Nausea   Gadolinium-Containing Contrast Media Hives and Itching   Mold Extracts Unknown   Penicillin G Nausea              Current Outpatient Medications on File Prior to Visit  Medication Sig Dispense Refill   albuterol sulfate (PROAIR  HFA INHAL) Inhale 90 mcg into the lungs every 6 (six) hours as needed 2 puffs q 6 to 8 hrs prn       aspirin 81 MG EC tablet Take 81 mg by mouth once daily       biotin 5 mg capsule Take by mouth       BYDUREON 2 mg Inject 2 mg subcutaneously once a week. As directed       cycloSPORINE (RESTASIS) 0.05 % ophthalmic emulsion Place 1 drop into both eyes 2 (two) times daily.       diclofenac (VOLTAREN) 1 % topical gel Apply topically       diphenhydrAMINE (BENADRYL) 25 mg capsule Inject 50 mg into the vein Take with all infusions       empagliflozin (JARDIANCE) 25 mg tablet Take 25 mg by mouth once daily       ergocalciferol (VITAMIN D2) 50,000 unit capsule 1x weekley       esomeprazole (NEXIUM) 40 MG DR capsule 2 tabs daily       evolocumab (REPATHA PUSHTRONEX) 420 mg/3.5 mL Injt Inject subcutaneously       ezetimibe (ZETIA) 10 mg tablet Take 10 mg by mouth once daily       FUROsemide (LASIX) 40 MG tablet Take 40 mg by mouth once daily as needed       ibuprofen (ADVIL,MOTRIN) 800 MG tablet prn as needed       insulin DEGLUDEC (TRESIBA FLEXTOUCH U-100) pen injector (concentration 100 units/mL) Inject subcutaneously       insulin LISPRO (ADMELOG, HUMALOG) injection (concentration 100 units/mL) Inject subcutaneously 3 (three) times daily with meals With meals through the Omnipod Dash Pump every 72 hours as directed       levothyroxine (SYNTHROID, LEVOTHROID) 175 MCG tablet Take 150 mcg by mouth once daily 200 mcg daily       meclizine (ANTIVERT) 25 mg tablet Take 25 mg by mouth daily as needed.       montelukast (SINGULAIR) 10 mg tablet 1 tab by mouth daily       mycophenolate (CELLCEPT) 500 mg tablet Take 1,000 mg by mouth every morning       olmesartan (BENICAR) 20 MG tablet 1 tab by mouth daily       predniSONE (DELTASONE) 2.5 MG tablet Take 3 tablets (7.5 mg total) by mouth once daily 270 tablet 2   riTUXimab in sodium chloride infusion chemo infusion Inject 1,000 mg into the vein once 2 doses of  '1000mg'$  separated by 2  weeks every 6 months       rosuvastatin (CRESTOR) 40 MG tablet Take 40 mg by mouth once daily       scopolamine (TRANSDERM-SCOP 1.5 MG) 1 mg over 3 days patch Place 1 patch onto the skin every third day.       topiramate (TOPAMAX) 50 MG tablet TAKE 1 TABLET BY MOUTH 2 (TWO) TIMES DAILY. START 1 TABLET DAILY X 1 WEEK AND THEN TWICE DAILY        No current facility-administered medications on file prior to visit.      Family History       Family History  Problem Relation Age of Onset   Coronary Artery Disease (Blocked arteries around heart) Mother     Diabetes Mother     Coronary Artery Disease (Blocked arteries around heart) Father     Diabetes Father     Glaucoma Father     Coronary Artery Disease (Blocked arteries around heart) Sister     Diabetes Sister     Myasthenia gravis Neg Hx     Macular degeneration Neg Hx     Blindness Neg Hx     Cataracts Neg Hx     Vision loss Neg Hx     Thyroid disease Neg Hx          Social History       Tobacco Use  Smoking Status Never  Smokeless Tobacco Never      Social History  Social History         Socioeconomic History   Marital status: Single   Number of children: 0  Occupational History   Occupation: customer service      Comment: previous  Tobacco Use   Smoking status: Never   Smokeless tobacco: Never  Vaping Use   Vaping Use: Never used  Substance and Sexual Activity   Alcohol use: No      Alcohol/week: 0.0 standard drinks of alcohol   Drug use: No  Social History Narrative    Pt lives alone. Pt is retired.         Objective:  There were no vitals filed for this visit.  There is no height or weight on file to calculate BMI.   Physical Exam    She appears well on exam and was able to get up on the exam table with minimal assistance   There are no palpable breast masses.  There is minimal bruising from her biopsy in the left breast.   No axillary adenopathy.   The nipple areolar  complexes are normal   Labs, Imaging and Diagnostic Testing: I have reviewed her mammograms, ultrasound, pathology results, and notes in the electronic medical records   Assessment and Plan:     Diagnoses and all orders for this visit:   Invasive ductal carcinoma of breast, left (CMS-HCC)       I had a discussion with the patient and her friend regarding her diagnosis of invasive ductal carcinoma of the left breast.  From a surgical standpoint we discussed breast conservation versus mastectomy.  She is a candidate for breast conservation.  I next discussed proceeding with a radioactive seed guided left breast lumpectomy and sentinel lymph node biopsy.  We discussed the reasons to biopsy lymph nodes at the time of surgery.  I explained the surgical procedure in detail.  We discussed the risks which includes but is not limited to bleeding, infection, injury to surrounding structures, chronic arm swelling, the need for further  procedures if the margins or lymph nodes are positive, cardiopulmonary issues, postoperative recovery, etc.  She understands and agrees to proceed with surgery.

## 2022-09-02 ENCOUNTER — Other Ambulatory Visit: Payer: Self-pay

## 2022-09-02 ENCOUNTER — Ambulatory Visit (HOSPITAL_COMMUNITY): Payer: Medicare Other | Admitting: Vascular Surgery

## 2022-09-02 ENCOUNTER — Encounter (HOSPITAL_COMMUNITY): Payer: Self-pay | Admitting: Surgery

## 2022-09-02 ENCOUNTER — Encounter (HOSPITAL_COMMUNITY)
Admission: RE | Disposition: A | Discharge status: Home / Self Care | Payer: Self-pay | Source: Home / Self Care | Attending: Surgery

## 2022-09-02 ENCOUNTER — Ambulatory Visit (HOSPITAL_BASED_OUTPATIENT_CLINIC_OR_DEPARTMENT_OTHER): Payer: Medicare Other | Admitting: Anesthesiology

## 2022-09-02 ENCOUNTER — Observation Stay (HOSPITAL_COMMUNITY)
Admission: RE | Admit: 2022-09-02 | Discharge: 2022-09-04 | Disposition: A | Discharge status: Home / Self Care | Payer: Medicare Other | Attending: Surgery | Admitting: Surgery

## 2022-09-02 DIAGNOSIS — G8918 Other acute postprocedural pain: Secondary | ICD-10-CM | POA: Diagnosis not present

## 2022-09-02 DIAGNOSIS — Z01818 Encounter for other preprocedural examination: Secondary | ICD-10-CM

## 2022-09-02 DIAGNOSIS — E119 Type 2 diabetes mellitus without complications: Secondary | ICD-10-CM

## 2022-09-02 DIAGNOSIS — Z794 Long term (current) use of insulin: Secondary | ICD-10-CM | POA: Diagnosis not present

## 2022-09-02 DIAGNOSIS — Z8673 Personal history of transient ischemic attack (TIA), and cerebral infarction without residual deficits: Secondary | ICD-10-CM | POA: Insufficient documentation

## 2022-09-02 DIAGNOSIS — G4730 Sleep apnea, unspecified: Secondary | ICD-10-CM | POA: Diagnosis not present

## 2022-09-02 DIAGNOSIS — Z79899 Other long term (current) drug therapy: Secondary | ICD-10-CM | POA: Insufficient documentation

## 2022-09-02 DIAGNOSIS — K449 Diaphragmatic hernia without obstruction or gangrene: Secondary | ICD-10-CM | POA: Diagnosis not present

## 2022-09-02 DIAGNOSIS — C50912 Malignant neoplasm of unspecified site of left female breast: Secondary | ICD-10-CM

## 2022-09-02 DIAGNOSIS — C50412 Malignant neoplasm of upper-outer quadrant of left female breast: Secondary | ICD-10-CM | POA: Diagnosis not present

## 2022-09-02 DIAGNOSIS — D0512 Intraductal carcinoma in situ of left breast: Principal | ICD-10-CM | POA: Insufficient documentation

## 2022-09-02 DIAGNOSIS — Z85850 Personal history of malignant neoplasm of thyroid: Secondary | ICD-10-CM | POA: Diagnosis not present

## 2022-09-02 DIAGNOSIS — Z170 Estrogen receptor positive status [ER+]: Secondary | ICD-10-CM | POA: Diagnosis not present

## 2022-09-02 DIAGNOSIS — I10 Essential (primary) hypertension: Secondary | ICD-10-CM

## 2022-09-02 DIAGNOSIS — Z9889 Other specified postprocedural states: Secondary | ICD-10-CM

## 2022-09-02 DIAGNOSIS — J45909 Unspecified asthma, uncomplicated: Secondary | ICD-10-CM | POA: Diagnosis not present

## 2022-09-02 DIAGNOSIS — N641 Fat necrosis of breast: Secondary | ICD-10-CM | POA: Diagnosis not present

## 2022-09-02 HISTORY — PX: BREAST LUMPECTOMY WITH RADIOACTIVE SEED AND SENTINEL LYMPH NODE BIOPSY: SHX6550

## 2022-09-02 LAB — GLUCOSE, CAPILLARY
Glucose-Capillary: 328 mg/dL — ABNORMAL HIGH (ref 70–99)
Glucose-Capillary: 317 mg/dL — ABNORMAL HIGH (ref 70–99)
Glucose-Capillary: 239 mg/dL — ABNORMAL HIGH (ref 70–99)
Glucose-Capillary: 257 mg/dL — ABNORMAL HIGH (ref 70–99)

## 2022-09-02 SURGERY — BREAST LUMPECTOMY WITH RADIOACTIVE SEED AND SENTINEL LYMPH NODE BIOPSY
Anesthesia: General | Site: Breast | Laterality: Left

## 2022-09-02 MED ORDER — ACETAMINOPHEN 10 MG/ML IV SOLN: 1000.0000 mg | Freq: Once | INTRAVENOUS | Status: DC | PRN

## 2022-09-02 MED ORDER — OXYCODONE HCL 5 MG/5ML PO SOLN: 5.0000 mg | Freq: Once | ORAL | Status: AC | PRN

## 2022-09-02 MED ORDER — METHOCARBAMOL 500 MG PO TABS
500.0000 mg | ORAL_TABLET | Freq: Four times a day (QID) | ORAL | Status: DC | PRN
  Administered 2022-09-02: 500 mg via ORAL
  Filled 2022-09-02: qty 1

## 2022-09-02 MED ORDER — INSULIN ASPART 100 UNIT/ML IJ SOLN
0.0000 [IU] | INTRAMUSCULAR | Status: DC | PRN
  Administered 2022-09-02: 10 [IU] via SUBCUTANEOUS
  Filled 2022-09-02: qty 1

## 2022-09-02 MED ORDER — ENSURE PRE-SURGERY PO LIQD: 296.0000 mL | Freq: Once | ORAL | Status: DC

## 2022-09-02 MED ORDER — CHLORHEXIDINE GLUCONATE CLOTH 2 % EX PADS: 6.0000 | MEDICATED_PAD | Freq: Once | CUTANEOUS | Status: DC

## 2022-09-02 MED ORDER — OXYCODONE HCL 5 MG PO TABS
ORAL_TABLET | ORAL | Status: AC
  Filled 2022-09-02: qty 1

## 2022-09-02 MED ORDER — CHLORHEXIDINE GLUCONATE 0.12 % MT SOLN
OROMUCOSAL | Status: AC
  Administered 2022-09-02: 15 mL via OROMUCOSAL
  Filled 2022-09-02: qty 15

## 2022-09-02 MED ORDER — HYDROMORPHONE HCL 1 MG/ML IJ SOLN
1.0000 mg | INTRAMUSCULAR | Status: DC | PRN
  Administered 2022-09-03 (×2): 1 mg via INTRAVENOUS
  Filled 2022-09-02 (×2): qty 1

## 2022-09-02 MED ORDER — DIPHENHYDRAMINE HCL 12.5 MG/5ML PO ELIX: 12.5000 mg | ORAL_SOLUTION | Freq: Four times a day (QID) | ORAL | Status: DC | PRN

## 2022-09-02 MED ORDER — BUPIVACAINE-EPINEPHRINE (PF) 0.5% -1:200000 IJ SOLN
INTRAMUSCULAR | Status: AC
  Filled 2022-09-02: qty 30

## 2022-09-02 MED ORDER — ROCURONIUM BROMIDE 10 MG/ML (PF) SYRINGE
PREFILLED_SYRINGE | INTRAVENOUS | Status: AC
  Filled 2022-09-02: qty 20

## 2022-09-02 MED ORDER — ONDANSETRON 4 MG PO TBDP: 4.0000 mg | ORAL_TABLET | Freq: Four times a day (QID) | ORAL | Status: DC | PRN

## 2022-09-02 MED ORDER — DEXAMETHASONE SODIUM PHOSPHATE 10 MG/ML IJ SOLN
INTRAMUSCULAR | Status: DC | PRN
  Administered 2022-09-02: 4 mg via INTRAVENOUS

## 2022-09-02 MED ORDER — INSULIN ASPART 100 UNIT/ML IJ SOLN
0.0000 [IU] | Freq: Three times a day (TID) | INTRAMUSCULAR | Status: DC
  Administered 2022-09-02: 7 [IU] via SUBCUTANEOUS
  Administered 2022-09-02: 15 [IU] via SUBCUTANEOUS
  Administered 2022-09-03: 4 [IU] via SUBCUTANEOUS
  Administered 2022-09-03: 15 [IU] via SUBCUTANEOUS
  Administered 2022-09-03: 11 [IU] via SUBCUTANEOUS
  Administered 2022-09-04: 3 [IU] via SUBCUTANEOUS
  Administered 2022-09-04: 4 [IU] via SUBCUTANEOUS

## 2022-09-02 MED ORDER — ACETAMINOPHEN 500 MG PO TABS: 1000.0000 mg | ORAL_TABLET | ORAL | Status: AC

## 2022-09-02 MED ORDER — PHENYLEPHRINE HCL-NACL 20-0.9 MG/250ML-% IV SOLN
INTRAVENOUS | Status: DC | PRN
  Administered 2022-09-02: 25 ug/min via INTRAVENOUS

## 2022-09-02 MED ORDER — MIDAZOLAM HCL 2 MG/2ML IJ SOLN
INTRAMUSCULAR | Status: AC
  Administered 2022-09-02: 1 mg via INTRAVENOUS
  Filled 2022-09-02: qty 2

## 2022-09-02 MED ORDER — ONDANSETRON HCL 4 MG/2ML IJ SOLN
INTRAMUSCULAR | Status: DC | PRN
  Administered 2022-09-02: 4 mg via INTRAVENOUS

## 2022-09-02 MED ORDER — FENTANYL CITRATE (PF) 100 MCG/2ML IJ SOLN
INTRAMUSCULAR | Status: AC
  Filled 2022-09-02: qty 2

## 2022-09-02 MED ORDER — ONDANSETRON HCL 4 MG/2ML IJ SOLN
4.0000 mg | Freq: Four times a day (QID) | INTRAMUSCULAR | Status: DC | PRN
  Administered 2022-09-03 (×2): 4 mg via INTRAVENOUS
  Filled 2022-09-02 (×2): qty 2

## 2022-09-02 MED ORDER — CHLORHEXIDINE GLUCONATE 0.12 % MT SOLN: 15.0000 mL | Freq: Once | OROMUCOSAL | Status: AC

## 2022-09-02 MED ORDER — ACETAMINOPHEN 500 MG PO TABS
1000.0000 mg | ORAL_TABLET | Freq: Four times a day (QID) | ORAL | Status: DC
  Administered 2022-09-03 – 2022-09-04 (×6): 1000 mg via ORAL
  Filled 2022-09-02 (×7): qty 2

## 2022-09-02 MED ORDER — PROPOFOL 10 MG/ML IV BOLUS
INTRAVENOUS | Status: DC | PRN
  Administered 2022-09-02: 200 mg via INTRAVENOUS
  Administered 2022-09-02: 100 mg via INTRAVENOUS

## 2022-09-02 MED ORDER — SODIUM CHLORIDE 0.9 % IV SOLN: INTRAVENOUS | Status: DC

## 2022-09-02 MED ORDER — DEXAMETHASONE SODIUM PHOSPHATE 10 MG/ML IJ SOLN
INTRAMUSCULAR | Status: AC
  Filled 2022-09-02: qty 2

## 2022-09-02 MED ORDER — 0.9 % SODIUM CHLORIDE (POUR BTL) OPTIME
TOPICAL | Status: DC | PRN
  Administered 2022-09-02: 1000 mL

## 2022-09-02 MED ORDER — ENOXAPARIN SODIUM 40 MG/0.4ML IJ SOSY
40.0000 mg | PREFILLED_SYRINGE | INTRAMUSCULAR | Status: DC
  Administered 2022-09-03 – 2022-09-04 (×2): 40 mg via SUBCUTANEOUS
  Filled 2022-09-02 (×2): qty 0.4

## 2022-09-02 MED ORDER — ONDANSETRON HCL 4 MG/2ML IJ SOLN
INTRAMUSCULAR | Status: AC
  Filled 2022-09-02: qty 4

## 2022-09-02 MED ORDER — ACETAMINOPHEN 500 MG PO TABS
ORAL_TABLET | ORAL | Status: AC
  Administered 2022-09-02: 1000 mg via ORAL
  Filled 2022-09-02: qty 2

## 2022-09-02 MED ORDER — LIDOCAINE 2% (20 MG/ML) 5 ML SYRINGE
INTRAMUSCULAR | Status: DC | PRN
  Administered 2022-09-02: 40 mg via INTRAVENOUS

## 2022-09-02 MED ORDER — CEFAZOLIN SODIUM-DEXTROSE 2-4 GM/100ML-% IV SOLN
2.0000 g | INTRAVENOUS | Status: AC
  Administered 2022-09-02: 2 g via INTRAVENOUS

## 2022-09-02 MED ORDER — MIDAZOLAM HCL 2 MG/2ML IJ SOLN: 1.0000 mg | Freq: Once | INTRAMUSCULAR | Status: AC

## 2022-09-02 MED ORDER — ACETAMINOPHEN 160 MG/5ML PO SOLN: 325.0000 mg | ORAL | Status: DC | PRN

## 2022-09-02 MED ORDER — DIPHENHYDRAMINE HCL 50 MG/ML IJ SOLN: 12.5000 mg | Freq: Four times a day (QID) | INTRAMUSCULAR | Status: DC | PRN

## 2022-09-02 MED ORDER — PHENYLEPHRINE 80 MCG/ML (10ML) SYRINGE FOR IV PUSH (FOR BLOOD PRESSURE SUPPORT)
PREFILLED_SYRINGE | INTRAVENOUS | Status: AC
  Filled 2022-09-02: qty 10

## 2022-09-02 MED ORDER — ORAL CARE MOUTH RINSE: 15.0000 mL | Freq: Once | OROMUCOSAL | Status: AC

## 2022-09-02 MED ORDER — OXYCODONE HCL 5 MG PO TABS
5.0000 mg | ORAL_TABLET | ORAL | Status: DC | PRN
  Administered 2022-09-02: 5 mg via ORAL
  Administered 2022-09-03: 10 mg via ORAL
  Filled 2022-09-02: qty 1
  Filled 2022-09-02: qty 2

## 2022-09-02 MED ORDER — ACETAMINOPHEN 325 MG PO TABS: 325.0000 mg | ORAL_TABLET | ORAL | Status: DC | PRN

## 2022-09-02 MED ORDER — MAGTRACE LYMPHATIC TRACER
INTRAMUSCULAR | Status: DC | PRN
  Administered 2022-09-02: 2 mL via INTRAMUSCULAR

## 2022-09-02 MED ORDER — OXYCODONE HCL 5 MG PO TABS
5.0000 mg | ORAL_TABLET | Freq: Once | ORAL | Status: AC | PRN
  Administered 2022-09-02: 5 mg via ORAL

## 2022-09-02 MED ORDER — AMISULPRIDE (ANTIEMETIC) 5 MG/2ML IV SOLN
INTRAVENOUS | Status: AC
  Filled 2022-09-02: qty 4

## 2022-09-02 MED ORDER — INSULIN ASPART 100 UNIT/ML IJ SOLN
0.0000 [IU] | Freq: Every day | INTRAMUSCULAR | Status: DC
  Administered 2022-09-02 – 2022-09-03 (×2): 2 [IU] via SUBCUTANEOUS

## 2022-09-02 MED ORDER — FENTANYL CITRATE (PF) 100 MCG/2ML IJ SOLN: 50.0000 ug | Freq: Once | INTRAMUSCULAR | Status: AC

## 2022-09-02 MED ORDER — FENTANYL CITRATE (PF) 100 MCG/2ML IJ SOLN
INTRAMUSCULAR | Status: AC
  Administered 2022-09-02: 50 ug via INTRAVENOUS
  Filled 2022-09-02: qty 2

## 2022-09-02 MED ORDER — FENTANYL CITRATE (PF) 250 MCG/5ML IJ SOLN
INTRAMUSCULAR | Status: AC
  Filled 2022-09-02: qty 5

## 2022-09-02 MED ORDER — LACTATED RINGERS IV SOLN: INTRAVENOUS | Status: DC

## 2022-09-02 MED ORDER — BUPIVACAINE-EPINEPHRINE (PF) 0.5% -1:200000 IJ SOLN
INTRAMUSCULAR | Status: DC | PRN
  Administered 2022-09-02: 30 mL

## 2022-09-02 MED ORDER — LIDOCAINE 2% (20 MG/ML) 5 ML SYRINGE
INTRAMUSCULAR | Status: AC
  Filled 2022-09-02: qty 10

## 2022-09-02 MED ORDER — AMISULPRIDE (ANTIEMETIC) 5 MG/2ML IV SOLN
10.0000 mg | Freq: Once | INTRAVENOUS | Status: AC | PRN
  Administered 2022-09-02: 10 mg via INTRAVENOUS

## 2022-09-02 MED ORDER — PROMETHAZINE HCL 25 MG/ML IJ SOLN: 6.2500 mg | INTRAMUSCULAR | Status: DC | PRN

## 2022-09-02 MED ORDER — FENTANYL CITRATE (PF) 100 MCG/2ML IJ SOLN
25.0000 ug | INTRAMUSCULAR | Status: DC | PRN
  Administered 2022-09-02 (×2): 50 ug via INTRAVENOUS

## 2022-09-02 MED ORDER — ONDANSETRON HCL 4 MG/2ML IJ SOLN
INTRAMUSCULAR | Status: AC
  Filled 2022-09-02: qty 2

## 2022-09-02 MED ORDER — BUPIVACAINE-EPINEPHRINE (PF) 0.5% -1:200000 IJ SOLN
INTRAMUSCULAR | Status: DC | PRN
  Administered 2022-09-02: 22 mL

## 2022-09-02 MED ORDER — CEFAZOLIN SODIUM-DEXTROSE 2-4 GM/100ML-% IV SOLN
INTRAVENOUS | Status: AC
  Filled 2022-09-02: qty 100

## 2022-09-02 MED ORDER — FENTANYL CITRATE (PF) 250 MCG/5ML IJ SOLN
INTRAMUSCULAR | Status: DC | PRN
  Administered 2022-09-02 (×3): 25 ug via INTRAVENOUS

## 2022-09-02 SURGICAL SUPPLY — 30 items
APPLIER CLIP 9.375 MED OPEN (MISCELLANEOUS) ×1
BAG COUNTER SPONGE SURGICOUNT (BAG) ×1 IMPLANT
CANISTER SUCT 3000ML PPV (MISCELLANEOUS) ×1 IMPLANT
CHLORAPREP W/TINT 26 (MISCELLANEOUS) ×1 IMPLANT
CLIP APPLIE 9.375 MED OPEN (MISCELLANEOUS) ×1 IMPLANT
COVER PROBE W GEL 5X96 (DRAPES) ×2 IMPLANT
COVER SURGICAL LIGHT HANDLE (MISCELLANEOUS) ×1 IMPLANT
DERMABOND ADVANCED .7 DNX12 (GAUZE/BANDAGES/DRESSINGS) ×1 IMPLANT
DEVICE DUBIN SPECIMEN MAMMOGRA (MISCELLANEOUS) ×1 IMPLANT
DRAPE CHEST BREAST 15X10 FENES (DRAPES) ×1 IMPLANT
ELECT CAUTERY BLADE 6.4 (BLADE) ×1 IMPLANT
ELECT REM PT RETURN 9FT ADLT (ELECTROSURGICAL) ×1
ELECTRODE REM PT RTRN 9FT ADLT (ELECTROSURGICAL) ×1 IMPLANT
GLOVE SURG SIGNA 7.5 PF LTX (GLOVE) ×1 IMPLANT
GOWN STRL REUS W/ TWL LRG LVL3 (GOWN DISPOSABLE) ×1 IMPLANT
GOWN STRL REUS W/ TWL XL LVL3 (GOWN DISPOSABLE) ×1 IMPLANT
GOWN STRL REUS W/TWL LRG LVL3 (GOWN DISPOSABLE) ×1
GOWN STRL REUS W/TWL XL LVL3 (GOWN DISPOSABLE) ×1
KIT BASIN OR (CUSTOM PROCEDURE TRAY) ×1 IMPLANT
KIT MARKER MARGIN INK (KITS) ×1 IMPLANT
NDL HYPO 25GX1X1/2 BEV (NEEDLE) ×1 IMPLANT
NEEDLE HYPO 25GX1X1/2 BEV (NEEDLE) ×1 IMPLANT
NS IRRIG 1000ML POUR BTL (IV SOLUTION) ×1 IMPLANT
PACK GENERAL/GYN (CUSTOM PROCEDURE TRAY) ×1 IMPLANT
SUT MNCRL AB 4-0 PS2 18 (SUTURE) ×1 IMPLANT
SUT VIC AB 3-0 SH 18 (SUTURE) ×1 IMPLANT
SYR CONTROL 10ML LL (SYRINGE) ×1 IMPLANT
TOWEL GREEN STERILE (TOWEL DISPOSABLE) ×1 IMPLANT
TOWEL GREEN STERILE FF (TOWEL DISPOSABLE) ×1 IMPLANT
TRACER MAGTRACE VIAL (MISCELLANEOUS) IMPLANT

## 2022-09-02 NOTE — Op Note (Signed)
   [name] 09/02/2022   Pre-op Diagnosis: LEFT BREAST CANCER     Post-op Diagnosis: same  Procedure(s): LEFT BREAST LUMPECTOMY WITH RADIOACTIVE SEED AND DEEP AXILLARY SENTINEL LYMPH NODE BIOPSY INJECTION OF MAGTRACE FOR LYMPH NODE MAPPING  Surgeon(s): Coralie Keens, MD  Anesthesia: General  Staff:  Circulator: Margy Clarks, RN Scrub Person: Phineas Antavious Spanos, RN  Estimated Blood Loss: Minimal               Specimens: sent to path  Indications: This is a 72 year old female found to have a small 7 mm mass in the upper outer quadrant of the left breast.  A biopsy showed an invasive ductal carcinoma which was ER positive, PR negative.  The decision was made to proceed with a radioactive seed guided left breast lumpectomy and sentinel lymph node biopsy.  Procedure: The patient was brought to the operating identified as a correct patient.  She was placed supine on the operating table and general anesthesia was induced.  I next injected under sterile technique mag trace underneath the left nipple areolar complex and then massaged the breast.  Her left breast and axilla were then prepped and draped in usual sterile fashion. Using neoprobe I located the radioactive seed at the lateral breast neuromas to 3 o'clock position.  I anesthetized skin over this area with Marcaine and then made a longitudinal incision with a scalpel.  I then dissected down to the breast tissue with the cautery  With the aid of the neoprobe, I then stayed widely around the radioactive seed performing a lumpectomy with the cautery.  Once the lumpectomy was completed I marked all margins with paint.  An x-ray of the specimen confirmed that the radioactive seed and previous tissue marker were in the center of the specimen.  The specimen was then sent to pathology for evaluation. In the MAC trace probe I then identified an area of increased uptake in the left axilla.  I anesthetized the skin  with Marcaine and then made an incision with a scalpel.  I then dissected down to the deep axillary tissue.  She had a large amount of fat in the tissue that made it fairly difficult to identify the sentinel node but t with the mag trace I was able to finally identify the sentinel node into several large pieces of fatty tissue.  There were other palpable lymph nodes in the specimen.  These were sent to pathology for evaluation.  I then achieved hemostasis in the axilla with surgical clips and the cautery.  The size of breast incision further with Marcaine and placed several clips around the periphery of the lumpectomy cavity.  I then closed both incisions with interrupted 3-0 Vicryl sutures and running 4-0 Monocryl sutures.  Dermabond was then applied.  The patient tolerated procedure well.  All the counts were correct at the end of the procedure.  The patient was then placed in a breast binder.  She was then extubated in the operating room and taken in stable addition to the recovery room.  Coralie Keens   Date: 09/02/2022  Time: 4:15 PM

## 2022-09-02 NOTE — Interval H&P Note (Signed)
History and Physical Interval Note: no change in H and P  09/02/2022 1:22 PM  [name]  has presented today for surgery, with the diagnosis of LEFT BREAST CANCER.  The various methods of treatment have been discussed with the patient and family. After consideration of risks, benefits and other options for treatment, the patient has consented to  Procedure(s): LEFT BREAST LUMPECTOMY WITH RADIOACTIVE SEED AND SENTINEL LYMPH NODE BIOPSY (Left) as a surgical intervention.  The patient's history has been reviewed, patient examined, no change in status, stable for surgery.  I have reviewed the patient's chart and labs.  Questions were answered to the patient's satisfaction.     Coralie Keens

## 2022-09-02 NOTE — Inpatient Diabetes Management (Signed)
Inpatient Diabetes Program Recommendations  AACE/ADA: New Consensus Statement on Inpatient Glycemic Control (2015)  Target Ranges:  Prepandial:   less than 140 mg/dL      Peak postprandial:   less than 180 mg/dL (1-2 hours)      Critically ill patients:  140 - 180 mg/dL   Lab Results  Component Value Date   GLUCAP 225 (H) 08/28/2022   HGBA1C 9.0 (H) 08/28/2022    Review of Glycemic Control  Diabetes history: DM2 Outpatient Diabetes medications: Tresiba 15 , Jardiance 25 mg qd, Bydurian 2 mg qweek, omnipod insulin pump Current orders for Inpatient glycemic control: None  Inpatient Diabetes Program Recommendations:   Received consult regarding diabetes management. Please consider: -Glycemic control order set with 0-9 units q 4 hrs. During surgery and while NPO.  Thank you, Nani Gasser. Hollye Pritt, RN, MSN, CDE  Diabetes Coordinator Inpatient Glycemic Control Team Team Pager [phone] (8am-5pm) 09/02/2022 1:37 PM

## 2022-09-02 NOTE — Anesthesia Procedure Notes (Signed)
Procedure Name: LMA Insertion Date/Time: 09/02/2022 3:11 PM  Performed by: Carolan Clines, CRNAPre-anesthesia Checklist: Patient identified, Emergency Drugs available, Suction available and Patient being monitored Patient Re-evaluated:Patient Re-evaluated prior to induction Oxygen Delivery Method: Circle System Utilized Preoxygenation: Pre-oxygenation with 100% oxygen Induction Type: IV induction Ventilation: Mask ventilation without difficulty LMA: LMA inserted LMA Size: 4.0 Number of attempts: 1 Placement Confirmation: positive ETCO2 Tube secured with: Tape Dental Injury: Teeth and Oropharynx as per pre-operative assessment

## 2022-09-02 NOTE — Plan of Care (Signed)
  Problem: Education: Goal: Ability to describe self-care measures that may prevent or decrease complications (Diabetes Survival Skills Education) will improve Outcome: Progressing   Problem: Coping: Goal: Ability to adjust to condition or change in health will improve Outcome: Progressing   Problem: Education: Goal: Knowledge of General Education information will improve Description: Including pain rating scale, medication(s)/side effects and non-pharmacologic comfort measures Outcome: Progressing   Problem: Activity: Goal: Risk for activity intolerance will decrease Outcome: Progressing   Problem: Coping: Goal: Level of anxiety will decrease Outcome: Progressing   Problem: Pain Managment: Goal: General experience of comfort will improve Outcome: Progressing   Problem: Safety: Goal: Ability to remain free from injury will improve Outcome: Progressing   

## 2022-09-02 NOTE — Transfer of Care (Signed)
Immediate Anesthesia Transfer of Care Note  Patient: [name]  Procedure(s) Performed: LEFT BREAST LUMPECTOMY WITH RADIOACTIVE SEED AND SENTINEL LYMPH NODE BIOPSY (Left: Breast)  Patient Location: PACU  Anesthesia Type:General  Level of Consciousness: drowsy  Airway & Oxygen Therapy: Patient Spontanous Breathing  Post-op Assessment: Report given to RN and Post -op Vital signs reviewed and stable  Post vital signs: Reviewed and stable  Last Vitals:  Vitals Value Taken Time  BP 198/65 09/02/22 1615  Temp    Pulse 99 09/02/22 1618  Resp 16 09/02/22 1618  SpO2 100 % 09/02/22 1618  Vitals shown include unvalidated device data.  Last Pain:  Vitals:   09/02/22 1404  TempSrc:   PainSc: 2          Complications: No notable events documented.

## 2022-09-02 NOTE — Anesthesia Procedure Notes (Signed)
Anesthesia Regional Block: Pectoralis block   Pre-Anesthetic Checklist: , timeout performed,  Correct Patient, Correct Site, Correct Laterality,  Correct Procedure, Correct Position, site marked,  Risks and benefits discussed,  Surgical consent,  Pre-op evaluation,  At surgeon's request and post-op pain management  Laterality: Left  Prep: chloraprep       Needles:  Injection technique: Single-shot  Needle Type: Echogenic Stimulator Needle     Needle Length: 9cm  Needle Gauge: 21     Additional Needles:   Procedures:,,,, ultrasound used (permanent image in chart),,    Narrative:  Start time: 09/02/2022 2:55 PM End time: 09/02/2022 3:00 PM Injection made incrementally with aspirations every 5 mL.  Performed by: Personally  Anesthesiologist: Effie Berkshire, MD  Additional Notes: Discussed risks and benefits of the nerve block in detail, including but not limited vascular injury, permanent nerve damage and infection.   Patient tolerated the procedure well. Local anesthetic introduced in an incremental fashion under minimal resistance after negative aspirations. No paresthesias were elicited. After completion of the procedure, no acute issues were identified and patient continued to be monitored by RN.

## 2022-09-03 ENCOUNTER — Other Ambulatory Visit: Payer: Self-pay

## 2022-09-03 ENCOUNTER — Encounter (HOSPITAL_COMMUNITY): Payer: Self-pay | Admitting: Surgery

## 2022-09-03 DIAGNOSIS — I10 Essential (primary) hypertension: Secondary | ICD-10-CM | POA: Diagnosis not present

## 2022-09-03 DIAGNOSIS — Z8673 Personal history of transient ischemic attack (TIA), and cerebral infarction without residual deficits: Secondary | ICD-10-CM | POA: Diagnosis not present

## 2022-09-03 DIAGNOSIS — E119 Type 2 diabetes mellitus without complications: Secondary | ICD-10-CM | POA: Diagnosis not present

## 2022-09-03 DIAGNOSIS — J45909 Unspecified asthma, uncomplicated: Secondary | ICD-10-CM | POA: Diagnosis not present

## 2022-09-03 DIAGNOSIS — D0512 Intraductal carcinoma in situ of left breast: Secondary | ICD-10-CM | POA: Diagnosis not present

## 2022-09-03 DIAGNOSIS — Z79899 Other long term (current) drug therapy: Secondary | ICD-10-CM | POA: Diagnosis not present

## 2022-09-03 DIAGNOSIS — Z85850 Personal history of malignant neoplasm of thyroid: Secondary | ICD-10-CM | POA: Diagnosis not present

## 2022-09-03 LAB — CBC
HCT: 28.7 % — ABNORMAL LOW (ref 36.0–46.0)
Hemoglobin: 9.1 g/dL — ABNORMAL LOW (ref 12.0–15.0)
MCH: 30.1 pg (ref 26.0–34.0)
MCHC: 31.7 g/dL (ref 30.0–36.0)
MCV: 95 fL (ref 80.0–100.0)
Platelets: 253 10*3/uL (ref 150–400)
RBC: 3.02 MIL/uL — ABNORMAL LOW (ref 3.87–5.11)
RDW: 13.4 % (ref 11.5–15.5)
WBC: 10.4 10*3/uL (ref 4.0–10.5)
nRBC: 0 % (ref 0.0–0.2)

## 2022-09-03 LAB — BASIC METABOLIC PANEL
Anion gap: 12 (ref 5–15)
BUN: 25 mg/dL — ABNORMAL HIGH (ref 8–23)
CO2: 21 mmol/L — ABNORMAL LOW (ref 22–32)
Calcium: 8.4 mg/dL — ABNORMAL LOW (ref 8.9–10.3)
Chloride: 103 mmol/L (ref 98–111)
Creatinine, Ser: 1.08 mg/dL — ABNORMAL HIGH (ref 0.44–1.00)
GFR, Estimated: 56 mL/min — ABNORMAL LOW (ref >60)
Glucose, Bld: 317 mg/dL — ABNORMAL HIGH (ref 70–99)
Potassium: 4.3 mmol/L (ref 3.5–5.1)
Sodium: 136 mmol/L (ref 135–145)

## 2022-09-03 LAB — GLUCOSE, CAPILLARY
Glucose-Capillary: 338 mg/dL — ABNORMAL HIGH (ref 70–99)
Glucose-Capillary: 256 mg/dL — ABNORMAL HIGH (ref 70–99)
Glucose-Capillary: 170 mg/dL — ABNORMAL HIGH (ref 70–99)
Glucose-Capillary: 245 mg/dL — ABNORMAL HIGH (ref 70–99)

## 2022-09-03 MED ORDER — INSULIN ASPART 100 UNIT/ML IJ SOLN: 0.0000 [IU] | Freq: Every day | INTRAMUSCULAR | Status: DC

## 2022-09-03 MED ORDER — INSULIN ASPART 100 UNIT/ML IJ SOLN
4.0000 [IU] | Freq: Three times a day (TID) | INTRAMUSCULAR | Status: DC
  Administered 2022-09-04 (×2): 4 [IU] via SUBCUTANEOUS

## 2022-09-03 MED ORDER — TRAMADOL HCL 50 MG PO TABS: 50.0000 mg | ORAL_TABLET | Freq: Four times a day (QID) | ORAL | 0 refills | Status: DC | PRN

## 2022-09-03 MED ORDER — SODIUM CHLORIDE 0.9 % IV SOLN
6.2500 mg | Freq: Four times a day (QID) | INTRAVENOUS | Status: DC | PRN
  Administered 2022-09-03: 6.25 mg via INTRAVENOUS
  Filled 2022-09-03: qty 0.25

## 2022-09-03 MED ORDER — IRBESARTAN 75 MG PO TABS
37.5000 mg | ORAL_TABLET | Freq: Every day | ORAL | Status: DC
  Administered 2022-09-04: 37.5 mg via ORAL
  Filled 2022-09-03 (×2): qty 1

## 2022-09-03 MED ORDER — ONDANSETRON HCL 4 MG PO TABS: 4.0000 mg | ORAL_TABLET | Freq: Three times a day (TID) | ORAL | 1 refills | Status: DC | PRN

## 2022-09-03 MED ORDER — ALBUTEROL SULFATE (2.5 MG/3ML) 0.083% IN NEBU: 3.0000 mL | INHALATION_SOLUTION | Freq: Four times a day (QID) | RESPIRATORY_TRACT | Status: DC | PRN

## 2022-09-03 MED ORDER — TOPIRAMATE 25 MG PO TABS
50.0000 mg | ORAL_TABLET | Freq: Every day | ORAL | Status: DC
  Administered 2022-09-03: 50 mg via ORAL
  Filled 2022-09-03 (×2): qty 2

## 2022-09-03 MED ORDER — TRAMADOL HCL 50 MG PO TABS
50.0000 mg | ORAL_TABLET | Freq: Four times a day (QID) | ORAL | Status: DC | PRN
  Filled 2022-09-03: qty 1

## 2022-09-03 MED ORDER — EZETIMIBE 10 MG PO TABS
10.0000 mg | ORAL_TABLET | Freq: Every day | ORAL | Status: DC
  Administered 2022-09-04: 10 mg via ORAL
  Filled 2022-09-03 (×2): qty 1

## 2022-09-03 MED ORDER — INSULIN GLARGINE-YFGN 100 UNIT/ML ~~LOC~~ SOLN
20.0000 [IU] | Freq: Every day | SUBCUTANEOUS | Status: DC
  Administered 2022-09-03: 20 [IU] via SUBCUTANEOUS
  Filled 2022-09-03 (×2): qty 0.2

## 2022-09-03 MED ORDER — LEVOTHYROXINE SODIUM 75 MCG PO TABS
175.0000 ug | ORAL_TABLET | Freq: Every day | ORAL | Status: DC
  Administered 2022-09-04: 175 ug via ORAL
  Filled 2022-09-03: qty 1

## 2022-09-03 MED ORDER — INSULIN ASPART 100 UNIT/ML IJ SOLN: 0.0000 [IU] | Freq: Three times a day (TID) | INTRAMUSCULAR | Status: DC

## 2022-09-03 MED ORDER — PREDNISONE 5 MG PO TABS
7.5000 mg | ORAL_TABLET | Freq: Every morning | ORAL | Status: DC
  Administered 2022-09-04: 7.5 mg via ORAL
  Filled 2022-09-03 (×2): qty 2

## 2022-09-03 NOTE — Progress Notes (Signed)
1 Day Post-Op   Subjective/Chief Complaint: Complains of nausea and incisional pain this morning   Objective: Vital signs in last 24 hours: Temp:  [97.6 F (36.4 C)-98.6 F (37 C)] 97.6 F (36.4 C) (01/11 0624) Pulse Rate:  [83-118] 83 (01/11 0707) Resp:  [13-30] 17 (01/11 0624) BP: (124-200)/(56-121) 166/70 (01/11 0707) SpO2:  [93 %-100 %] 94 % (01/11 0624) Weight:  [90.4 kg] 90.4 kg (01/10 1330)    Intake/Output from previous day: 01/10 0701 - 01/11 0700 In: 877.7 [I.V.:877.7] Out: 10 [Blood:10] Intake/Output this shift: No intake/output data recorded.  Exam: Awake and alert Binder in place Lungs clear Neuro grossly intact   Lab Results:  Recent Labs    09/03/22 0442  WBC 10.4  HGB 9.1*  HCT 28.7*  PLT 253   BMET Recent Labs    09/03/22 0442  NA 136  K 4.3  CL 103  CO2 21*  GLUCOSE 317*  BUN 25*  CREATININE 1.08*  CALCIUM 8.4*   PT/INR No results for input(s): "LABPROT", "INR" in the last 72 hours. ABG No results for input(s): "PHART", "HCO3" in the last 72 hours.  Invalid input(s): "PCO2", "PO2"  Studies/Results: No results found.  Anti-infectives: Anti-infectives (From admission, onward)    Start     Dose/Rate Route Frequency Ordered Stop   09/03/22 0600  ceFAZolin (ANCEF) IVPB 2g/100 mL premix        2 g 200 mL/hr over 30 Minutes Intravenous On call to O.R. 09/02/22 1324 09/02/22 1515   09/02/22 1331  ceFAZolin (ANCEF) 2-4 GM/100ML-% IVPB       Note to Pharmacy: Odelia Gage E: cabinet override      09/02/22 1331 09/02/22 1522       Assessment/Plan: s/p Procedure(s): LEFT BREAST LUMPECTOMY WITH RADIOACTIVE SEED AND SENTINEL LYMPH NODE BIOPSY (Left)  Discharge home after lunch if ambulating and nausea ok  LOS: 0 days    Coralie Keens MD 09/03/2022

## 2022-09-03 NOTE — Discharge Instructions (Signed)
[place] Office Phone Number [phone]  BREAST BIOPSY/ PARTIAL MASTECTOMY: POST OP INSTRUCTIONS  Always review your discharge instruction sheet given to you by the facility where your surgery was performed.  IF YOU HAVE DISABILITY OR FAMILY LEAVE FORMS, YOU MUST BRING THEM TO THE OFFICE FOR PROCESSING.  DO NOT GIVE THEM TO YOUR DOCTOR.  A prescription for pain medication may be given to you upon discharge.  Take your pain medication as prescribed, if needed.  If narcotic pain medicine is not needed, then you may take acetaminophen (Tylenol) or ibuprofen (Advil) as needed. Take your usually prescribed medications unless otherwise directed If you need a refill on your pain medication, please contact your pharmacy.  They will contact our office to request authorization.  Prescriptions will not be filled after 5pm or on week-ends. You should eat very light the first 24 hours after surgery, such as soup, crackers, pudding, etc.  Resume your normal diet the day after surgery. Most patients will experience some swelling and bruising in the breast.  Ice packs and a good support bra will help.  Swelling and bruising can take several days to resolve.  It is common to experience some constipation if taking pain medication after surgery.  Increasing fluid intake and taking a stool softener will usually help or prevent this problem from occurring.  A mild laxative (Milk of Magnesia or Miralax) should be taken according to package directions if there are no bowel movements after 48 hours. Unless discharge instructions indicate otherwise, you may remove your bandages 24-48 hours after surgery, and you may shower at that time.  You may have steri-strips (small skin tapes) in place directly over the incision.  These strips should be left on the skin for 7-10 days.  If your surgeon used skin glue on the incision, you may shower in 24 hours.  The glue will flake off over the next 2-3 weeks.  Any  sutures or staples will be removed at the office during your follow-up visit. ACTIVITIES:  You may resume regular daily activities (gradually increasing) beginning the next day.  Wearing a good support bra or sports bra minimizes pain and swelling.  You may have sexual intercourse when it is comfortable. You may drive when you no longer are taking prescription pain medication, you can comfortably wear a seatbelt, and you can safely maneuver your car and apply brakes. RETURN TO WORK:  ______________________________________________________________________________________ Dennis Bast should see your doctor in the office for a follow-up appointment approximately two weeks after your surgery.  Your doctor's nurse will typically make your follow-up appointment when she calls you with your pathology report.  Expect your pathology report 2-3 business days after your surgery.  You may call to check if you do not hear from [place] after three days. OTHER INSTRUCTIONS: YOU MAY REMOVE THE BINDER AND SHOWER STARTING TODAY, THEN PUT THE BINDER BACK ON ICE PACK, TYLENOL, AND IBUPROFEN ALSO FOR PAIN NO VIGOROUS ACTIVITY FOR ONE WEEK _______________________________________________________________________________________________ _____________________________________________________________________________________________________________________________________ _____________________________________________________________________________________________________________________________________ _____________________________________________________________________________________________________________________________________  WHEN TO CALL YOUR DOCTOR: Fever over 101.0 Nausea and/or vomiting. Extreme swelling or bruising. Continued bleeding from incision. Increased pain, redness, or drainage from the incision.  The clinic staff is available to answer your questions during regular business hours.  Please don't hesitate to call and  ask to speak to one of the nurses for clinical concerns.  If you have a medical emergency, go to the nearest emergency room or call 911.  A surgeon from [facility] is always on  call at the hospital.  For further questions, please visit [email or web address]

## 2022-09-03 NOTE — Inpatient Diabetes Management (Addendum)
Inpatient Diabetes Program Recommendations  AACE/ADA: New Consensus Statement on Inpatient Glycemic Control (2015)  Target Ranges:  Prepandial:   less than 140 mg/dL      Peak postprandial:   less than 180 mg/dL (1-2 hours)      Critically ill patients:  140 - 180 mg/dL   Lab Results  Component Value Date   GLUCAP 256 (H) 09/03/2022   HGBA1C 9.0 (H) 08/28/2022    Review of Glycemic Control  Latest Reference Range & Units 09/02/22 18:18 09/02/22 21:30 09/03/22 07:50 09/03/22 11:22  Glucose-Capillary 70 - 99 mg/dL 239 (H) 257 (H) 338 (H) 256 (H)   Diabetes history: DM  Outpatient Diabetes medications:  Tresiba 15 units daily , Jardiance 25 mg qd, Bydurian 2 mg qweek, omnipod insulin pump (using Humalog insulin)- last visible settings for insulin pump were as follows from 10/23/21:   Basal insulin  0000-0400       1.0 units/hour 0401-1300       1.4 units/hour 1301-1800       1.65 units/hour 1801-0000       1.45 units/hour Total daily basal insulin: 33.55 units/24 hours  Current orders for Inpatient glycemic control:  Novolog resistant tid with meals and HS Inpatient Diabetes Program Recommendations:    Note possible discharge today.  Patient states that at home she uses both insulin pump and basal insulin (however she is unsure of what her basal rates are on insulin pump?). She is currently nauseas.  Consider restarting a portion of basal insulin while patient is in the hospital?  Consider adding Semglee 20 units daily. Also consider adding Novolog meal coverage 4 units tid with meals (hold if patient eats less than 50% or NPO).    Thanks,  Adah Perl, RN, BC-ADM Inpatient Diabetes Coordinator Pager [phone]  (8a-5p)

## 2022-09-03 NOTE — Anesthesia Postprocedure Evaluation (Signed)
Anesthesia Post Note  Patient: GLORA HULGAN  Procedure(s) Performed: LEFT BREAST LUMPECTOMY WITH RADIOACTIVE SEED AND SENTINEL LYMPH NODE BIOPSY (Left: Breast)     Patient location during evaluation: PACU Anesthesia Type: General Level of consciousness: awake and alert Pain management: pain level controlled Vital Signs Assessment: post-procedure vital signs reviewed and stable Respiratory status: spontaneous breathing, nonlabored ventilation, respiratory function stable and patient connected to nasal cannula oxygen Cardiovascular status: blood pressure returned to baseline and stable Postop Assessment: no apparent nausea or vomiting Anesthetic complications: no   No notable events documented.               Effie Berkshire

## 2022-09-04 DIAGNOSIS — Z85850 Personal history of malignant neoplasm of thyroid: Secondary | ICD-10-CM | POA: Diagnosis not present

## 2022-09-04 DIAGNOSIS — Z8673 Personal history of transient ischemic attack (TIA), and cerebral infarction without residual deficits: Secondary | ICD-10-CM | POA: Diagnosis not present

## 2022-09-04 DIAGNOSIS — C50912 Malignant neoplasm of unspecified site of left female breast: Secondary | ICD-10-CM | POA: Diagnosis not present

## 2022-09-04 DIAGNOSIS — E119 Type 2 diabetes mellitus without complications: Secondary | ICD-10-CM | POA: Diagnosis not present

## 2022-09-04 DIAGNOSIS — Z79899 Other long term (current) drug therapy: Secondary | ICD-10-CM | POA: Diagnosis not present

## 2022-09-04 DIAGNOSIS — D0512 Intraductal carcinoma in situ of left breast: Secondary | ICD-10-CM | POA: Diagnosis not present

## 2022-09-04 DIAGNOSIS — J45909 Unspecified asthma, uncomplicated: Secondary | ICD-10-CM | POA: Diagnosis not present

## 2022-09-04 DIAGNOSIS — I10 Essential (primary) hypertension: Secondary | ICD-10-CM | POA: Diagnosis not present

## 2022-09-04 LAB — GLUCOSE, CAPILLARY
Glucose-Capillary: 122 mg/dL — ABNORMAL HIGH (ref 70–99)
Glucose-Capillary: 177 mg/dL — ABNORMAL HIGH (ref 70–99)

## 2022-09-04 NOTE — Progress Notes (Signed)
   09/04/22 1200  Spiritual Encounters  Type of Visit Initial  Care provided to: Patient  Referral source Nurse (RN/NT/LPN)  Reason for visit Routine spiritual support  OnCall Visit No   Chaplain responded to a spiritual consult for advanced directive education. The patient, [name] and I went over the paperwork for an advanced directive. We discussed the key points for [facility] to complete. I also advised her if she had any additional questions to let her nurse know and someone would respond.  Danice Goltz [facility]  [phone]

## 2022-09-04 NOTE — Plan of Care (Signed)
  Problem: Education: Goal: Ability to describe self-care measures that may prevent or decrease complications (Diabetes Survival Skills Education) will improve Outcome: Progressing   Problem: Coping: Goal: Ability to adjust to condition or change in health will improve Outcome: Progressing   Problem: Education: Goal: Knowledge of General Education information will improve Description: Including pain rating scale, medication(s)/side effects and non-pharmacologic comfort measures Outcome: Progressing   Problem: Coping: Goal: Level of anxiety will decrease Outcome: Progressing   

## 2022-09-04 NOTE — Discharge Summary (Signed)
Physician Discharge Summary  Patient ID: [name] MRN: [id number] DOB/AGE: 1951/03/28 72 y.o.  Admit date: 09/02/2022 Discharge date: 09/04/2022  Admission Diagnoses:  Discharge Diagnoses:  Principal Problem:   S/P breast lumpectomy Active Problems:   S/P lumpectomy, left breast Left breast cancer Myasthenia Gravis  Discharged Condition: good  Hospital Course: admitted after left breat surgery.  On POD#1 she was severely nauseated.  The decision was made to stay an extra day given her comorbid conditions and the fact she lives alone.  Doing much better on POD#2 so discharged home  Consults: None  Significant Diagnostic Studies:   Treatments: surgery: left breast radioactive seed guided lumpectomy and sentinel node biopsy  Discharge Exam: Blood pressure (!) 141/65, pulse 94, temperature 98 F (36.7 C), temperature source Oral, resp. rate 17, height '5\' 3"'$  (1.6 m), weight 90.4 kg, SpO2 98 %. Incision/Wound: healing well Generally she looks much better  Disposition: Discharge disposition: 01-Home or Self Care        Allergies as of 09/04/2022       Reactions   Contrast Media [iodinated Contrast Media] Anaphylaxis   01/10/15 and 09/18/2021 --PT GIVEN 13 HR PRE MEDS FOR CT with contrast TOLERATED IV CONTRAST W/O ANY REACTION   Fluorescein Shortness Of Breath, Other (See Comments)   (Dye)   Iodine Anaphylaxis   Contrast dye - iodine   Molds & Smuts Shortness Of Breath, Other (See Comments)   Congestion and wheezing, also   Shellfish-derived Products Anaphylaxis, Shortness Of Breath, Swelling, Other (See Comments)   Welts, also   Pholcodine Hives   Azithromycin Nausea Only   Codeine Hives, Nausea Only   Metformin Hcl Er Nausea Only   Tramadol Hcl Nausea Only   Other Rash, Other (See Comments)   Coban causes welts, also        Medication List     TAKE these medications    acetaminophen 500 MG tablet Commonly known as: TYLENOL Take 500 mg by mouth every  6 (six) hours as needed for moderate pain.   ALPRAZolam 0.25 MG tablet Commonly known as: Xanax Take 1 tablet (0.25 mg total) by mouth at bedtime as needed for anxiety.   aspirin EC 81 MG tablet Take 1 tablet (81 mg total) by mouth daily.   Biotin 5000 MCG Caps Take 5,000 mcg by mouth daily with breakfast.   Bydureon BCise 2 MG/0.85ML Auij Generic drug: Exenatide ER Inject 2 mg into the skin every Monday.   cyanocobalamin 1000 MCG tablet Commonly known as: VITAMIN B12 Take 1,000 mcg by mouth daily.   cycloSPORINE 0.05 % ophthalmic emulsion Commonly known as: RESTASIS Place 1 drop into both eyes 2 (two) times daily.   diclofenac Sodium 1 % Gel Commonly known as: VOLTAREN Apply 2 g topically 4 (four) times daily. What changed:  how much to take when to take this reasons to take this   diphenhydrAMINE 25 mg in sodium chloride 0.9 % 50 mL Inject 25 mg into the vein See admin instructions. Administer 25 mg intravenously at start of Gamunex infusion, then administer 25 mg during infusion   ELDERBERRY PO Take 1 tablet by mouth daily.   empagliflozin 25 MG Tabs tablet Commonly known as: JARDIANCE Take 25 mg by mouth daily.   esomeprazole 40 MG capsule Commonly known as: NEXIUM Take 1 capsule (40 mg total) by mouth 2 (two) times daily.   ezetimibe 10 MG tablet Commonly known as: ZETIA TAKE 1 TABLET BY MOUTH EVERY DAY   furosemide  40 MG tablet Commonly known as: LASIX Take 40 mg by mouth daily as needed for edema.   ibuprofen 800 MG tablet Commonly known as: ADVIL Take 800 mg by mouth every 8 (eight) hours as needed for moderate pain.   Immune Globulin (Human) 40 GM/400ML Soln Inject into the vein every 6 (six) weeks. Infusions are administered at home by home health nurse - last infusion 09/08/21 and 09/09/21; next infusion due 10/19/21 and 10/20/21   insulin lispro 100 UNIT/ML injection Commonly known as: HUMALOG Inject into the skin See admin instructions. Inject  subcutaneously 3 (three) times daily with meals With meals through the Omnipod Dash Pump every 72 hours as directed   levothyroxine 175 MCG tablet Commonly known as: SYNTHROID Take 175 mcg by mouth daily before breakfast.   lidocaine 5 % Commonly known as: LIDODERM Place 1 patch onto the skin daily. Remove & Discard patch within 12 hours or as directed by MD What changed:  when to take this reasons to take this   loratadine 10 MG tablet Commonly known as: CLARITIN Take 1 tablet (10 mg total) by mouth at bedtime.   meclizine 25 MG tablet Commonly known as: ANTIVERT Take 25 mg by mouth 3 (three) times daily as needed for dizziness or nausea (or migraine-related nausea).   montelukast 10 MG tablet Commonly known as: SINGULAIR Take 1 tablet (10 mg total) by mouth every morning.   mycophenolate 500 MG tablet Commonly known as: CELLCEPT Take 1,000 mg by mouth 2 (two) times daily. For myasthenia gravis   olmesartan 20 MG tablet Commonly known as: BENICAR Take 1 tablet (20 mg total) by mouth daily.   Omnipod DASH Pods (Gen 4) Misc Use with Humalog   ondansetron 4 MG tablet Commonly known as: Zofran Take 1 tablet (4 mg total) by mouth every 8 (eight) hours as needed for nausea or vomiting.   predniSONE 2.5 MG tablet Commonly known as: DELTASONE Take 7.5 mg by mouth every morning. For myasthenia gravis   PRESCRIPTION MEDICATION Pt uses a cpap nightly   ProAir HFA 108 (90 Base) MCG/ACT inhaler Generic drug: albuterol Inhale 2 puffs into the lungs every 6 (six) hours as needed for wheezing or shortness of breath.   Repatha Pushtronex System 420 MG/3.5ML Soct Generic drug: Evolocumab with Infusor Inject 420 mg into the skin every 30 (thirty) days.   Rituxan 500 MG/50ML injection Generic drug: riTUXimab Inject into the vein every 6 (six) months.   rosuvastatin 40 MG tablet Commonly known as: CRESTOR Take 1 tablet (40 mg total) by mouth daily.   topiramate 50 MG  tablet Commonly known as: TOPAMAX Take 1 tablet (50 mg total) by mouth at bedtime.   traMADol 50 MG tablet Commonly known as: ULTRAM Take 1 tablet (50 mg total) by mouth every 6 (six) hours as needed for moderate pain.   Tyler Aas FlexTouch 100 UNIT/ML FlexTouch Pen Generic drug: insulin degludec Inject 15 Units into the skin in the morning.   Vitamin D (Ergocalciferol) 1.25 MG (50000 UNIT) Caps capsule Commonly known as: DRISDOL Take 50,000 Units by mouth every Monday.        Follow-up Information     Coralie Keens, MD. Schedule an appointment as soon as possible for a visit in 3 week(s).   Specialty: General Surgery Contact information: [address] [place] [address] [phone]                 Signed: Coralie Keens 09/04/2022, 7:29 AM

## 2022-09-04 NOTE — Progress Notes (Signed)
Patient ID: [name], female   DOB: 02-Apr-1951, 72 y.o.   MRN: [id number]   She is feeling much better today Nausea resolved.  Plan: Discharge home

## 2022-09-11 ENCOUNTER — Telehealth: Payer: Self-pay | Admitting: *Deleted

## 2022-09-11 NOTE — Telephone Encounter (Signed)
Request sent to pathology to repeat prognostics on the final surgical specimen per Dr. Burr Medico.

## 2022-09-15 ENCOUNTER — Encounter: Payer: Self-pay | Admitting: *Deleted

## 2022-09-15 LAB — SURGICAL PATHOLOGY

## 2022-09-16 ENCOUNTER — Other Ambulatory Visit: Payer: Self-pay

## 2022-09-23 NOTE — Progress Notes (Unsigned)
[name]   [phone] [phone]   Clinic Follow up Note   Patient Care Team: [name], DO as PCP - General (Family Medicine) Garvin Fila, MD as Consulting Physician (Neurology) Frann Rider, NP as Nurse Practitioner (Neurology) Rockwell Germany, RN as Oncology Nurse Navigator Mauro Kaufmann, RN as Oncology Nurse Navigator Coralie Keens, MD as Consulting Physician (General Surgery) Truitt Merle, MD as Consulting Physician (Hematology) Kyung Rudd, MD as Consulting Physician (Radiation Oncology)  Date of Service:  09/24/2022  CHIEF COMPLAINT: f/u of Left Breast Cancer, ER+   CURRENT THERAPY:  Pending ASSESSMENT:  [name] is a 72 y.o. 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 -I discussed the aggressive nature of triple negative disease.  She has moderate risk of recurrence given the early stage disease. -I discussed the benefit of adjuvant chemotherapy, such as docetaxel and cyclophosphamide every 3 weeks for 4 cycles, or weekly Taxol for 12 weeks.  Due to her medical comorbidities, she is not a good candidate for intensive chemotherapy. I recommend weekly Abraxane (to avoid steroid due to her DM) for 12 weeks, if she can tolerated  -due to his medical comorbidities, especially severe immunosuppression from his treatment for myasthenia gravis, his risk of complication from chemotherapy is high, especially risk of infections. -We also discussed that given relatively low risk of recurrence  due to early stage disease, if she decides not to pursue chemotherapy, that is also reasonable.  Or if she has tolerance issue to chemotherapy, we can stop earlier.  -After the lengthy discussion, patient and her sister wants to have more time to think about it.  She will call me back early next week with her decision. -She understands chemotherapy were be given before radiation, I explained to her the difference between chemo and radiation, and she understands that she was to need adjuvant radiation even she takes chemotherapy.  Myasthenia gravis ([place]) -She is on Rituxan, CellCept and prednisone.      PLAN: - Discuss her surgical pathology results, reviewed the aggressive nature of triple negative disease  -Discuss Benefit of chemo regiment abraxane or Taxol and side effects -Discuss Radiation -she will call me early next week with her decision about chemotherapy   SUMMARY OF ONCOLOGIC HISTORY: Oncology History  Malignant neoplasm of upper-outer quadrant of left breast in female, estrogen receptor positive ([place])  08/10/2022 Initial Diagnosis   Malignant neoplasm of upper-outer quadrant of left female breast ([place])   08/10/2022 Cancer Staging   Staging form: Breast, AJCC 8th Edition - Clinical: Stage IB (cT1b, cN0, cM0, G3, ER+, PR-, HER2-) - Signed by Hayden Pedro, PA-C on 08/10/2022 Stage prefix: Initial diagnosis Method of lymph node assessment: Clinical Histologic grading system: 3 grade system   09/02/2022 Cancer Staging   Staging form: Breast, AJCC 8th Edition - Pathologic stage from 09/02/2022: Stage IB (pT1b, pN0, cM0, G2, ER-, PR-, HER2-) - Signed by Truitt Merle, MD on 09/23/2022 Stage prefix: Initial diagnosis Histologic grading system: 3 grade system Residual tumor (R): R0 - None      INTERVAL HISTORY:  [name]  [name] is here for a follow up of  Left Breast Cancer, ER+ She was last seen by me on 08/12/2022 She presents to the clinic accompanied by sister. Pt  had surgery on Jan.10,2024. Pt states that she can get around her home and she lives alone.Pt denies having any infections.      All other systems were reviewed with the patient and are negative.  MEDICAL HISTORY:  Past Medical History:  Diagnosis Date   Arthritis    "all over my body" (03/13/2013)   Asthma    Asymptomatic carotid artery stenosis, bilateral 10/08/2018   Breast cancer (HCC)    GERD (gastroesophageal reflux disease)    H/O hiatal hernia    Headache    pt states she has had headaches for about 6 months "off and on"   Heart murmur    pt had echocardiogram on 09/17/21   Hypercholesterolemia 10/08/2018   Hypertension    Hypothyroidism    Myasthenia gravis ([place])    "in my eyes; diagnsosed > 7 yr ago" (03/13/2013)   Sleep apnea    on CPAP   Stroke ([place]) 2021   Thyroid carcinoma ([place])    Type II diabetes mellitus ([place])     SURGICAL HISTORY: Past Surgical History:  Procedure Laterality Date   ABDOMINAL HYSTERECTOMY     ANTERIOR CERVICAL DECOMP/DISCECTOMY FUSION     "I've had severa ORs; always went in from the front" (03/13/2013)   APPENDECTOMY     BREAST LUMPECTOMY WITH RADIOACTIVE SEED AND SENTINEL LYMPH NODE BIOPSY Left 09/02/2022   Procedure: LEFT BREAST LUMPECTOMY WITH RADIOACTIVE SEED AND SENTINEL LYMPH NODE BIOPSY;  Surgeon: Coralie Keens, MD;  Location: [facility];  Service: General;  Laterality: Left;   CARDIAC CATHETERIZATION     "several" (03/13/2013)   CARPAL TUNNEL RELEASE Right    CATARACT EXTRACTION W/ INTRAOCULAR LENS  IMPLANT, BILATERAL Bilateral    CHOLECYSTECTOMY     KNEE ARTHROSCOPY Left    SHOULDER ARTHROSCOPY W/ ROTATOR CUFF REPAIR Left    TONSILLECTOMY     TOTAL THYROIDECTOMY     TRANSCAROTID ARTERY REVASCULARIZATION  Left 09/24/2021   Procedure: LEFT TRANSCAROTID ARTERY REVASCULARIZATION;  Surgeon: Cherre Robins, MD;  Location: [facility] OR;  Service: Vascular;  Laterality: Left;   TRANSCAROTID ARTERY REVASCULARIZATION  Right 10/23/2021    Procedure: Right Transcarotid Artery Revascularization;  Surgeon: Cherre Robins, MD;  Location: [facility];  Service: Vascular;  Laterality: Right;   ULTRASOUND GUIDANCE FOR VASCULAR ACCESS Right 09/24/2021   Procedure: ULTRASOUND GUIDANCE FOR VASCULAR ACCESS;  Surgeon: Cherre Robins, MD;  Location: [facility] OR;  Service: Vascular;  Laterality: Right;   ULTRASOUND GUIDANCE FOR VASCULAR ACCESS Right 10/23/2021   Procedure: ULTRASOUND GUIDANCE FOR VASCULAR ACCESS, LEFT FEMORAL VEIN;  Surgeon: Cherre Robins, MD;  Location: [facility] OR;  Service: Vascular;  Laterality: Right;    I have reviewed the social history and family history with the patient and they are unchanged from previous note.  ALLERGIES:  is allergic to contrast media [iodinated contrast media], fluorescein, iodine, molds & smuts, shellfish-derived products, pholcodine, azithromycin, codeine, metformin hcl er, tramadol hcl, and other.  MEDICATIONS:  Current Outpatient Medications  Medication Sig Dispense Refill   acetaminophen (TYLENOL) 500 MG tablet Take 500 mg by mouth every 6 (six) hours as needed for moderate pain.     ALPRAZolam (XANAX) 0.25 MG tablet Take 1 tablet (0.25 mg total) by mouth at bedtime as needed for anxiety. (Patient not taking: Reported on 08/25/2022)  2 tablet 0   aspirin EC 81 MG EC tablet Take 1 tablet (81 mg total) by mouth daily.     Biotin 5000 MCG CAPS Take 5,000 mcg by mouth daily with breakfast.      cycloSPORINE (RESTASIS) 0.05 % ophthalmic emulsion Place 1 drop into both eyes 2 (two) times daily. 0.4 mL 0   diclofenac Sodium (VOLTAREN) 1 % GEL Apply 2 g topically 4 (four) times daily. (Patient taking differently: Apply 1 application  topically daily as needed (pain).) 2 g 0   diphenhydrAMINE 25 mg in sodium chloride 0.9 % 50 mL Inject 25 mg into the vein See admin instructions. Administer 25 mg intravenously at start of Gamunex infusion, then administer 25 mg during infusion     ELDERBERRY PO Take 1 tablet by mouth  daily.     empagliflozin (JARDIANCE) 25 MG TABS tablet Take 25 mg by mouth daily.     esomeprazole (NEXIUM) 40 MG capsule Take 1 capsule (40 mg total) by mouth 2 (two) times daily. 60 capsule 0   Evolocumab with Infusor ([place]) 420 MG/3.5ML SOCT Inject 420 mg into the skin every 30 (thirty) days.     Exenatide ER (BYDUREON BCISE) 2 MG/0.85ML AUIJ Inject 2 mg into the skin every Monday.     ezetimibe (ZETIA) 10 MG tablet TAKE 1 TABLET BY MOUTH EVERY DAY 90 tablet 1   furosemide (LASIX) 40 MG tablet Take 40 mg by mouth daily as needed for edema.     ibuprofen (ADVIL) 800 MG tablet Take 800 mg by mouth every 8 (eight) hours as needed for moderate pain.     Immune Globulin, Human, 40 GM/400ML SOLN Inject into the vein every 6 (six) weeks. Infusions are administered at home by home health nurse - last infusion 09/08/21 and 09/09/21; next infusion due 10/19/21 and 10/20/21     insulin degludec (TRESIBA FLEXTOUCH) 100 UNIT/ML FlexTouch Pen Inject 15 Units into the skin in the morning.     Insulin Disposable Pump (OMNIPOD DASH 5 PACK PODS) MISC Use with Humalog     insulin lispro (HUMALOG) 100 UNIT/ML injection Inject into the skin See admin instructions. Inject subcutaneously 3 (three) times daily with meals With meals through the Omnipod Dash Pump every 72 hours as directed     levothyroxine (SYNTHROID) 175 MCG tablet Take 175 mcg by mouth daily before breakfast.     lidocaine (LIDODERM) 5 % Place 1 patch onto the skin daily. Remove & Discard patch within 12 hours or as directed by MD (Patient taking differently: Place 1 patch onto the skin daily as needed. Remove & Discard patch within 12 hours or as directed by MD) 30 patch 0   loratadine (CLARITIN) 10 MG tablet Take 1 tablet (10 mg total) by mouth at bedtime. (Patient not taking: Reported on 08/25/2022) 30 tablet 0   meclizine (ANTIVERT) 25 MG tablet Take 25 mg by mouth 3 (three) times daily as needed for dizziness or nausea (or  migraine-related nausea).     montelukast (SINGULAIR) 10 MG tablet Take 1 tablet (10 mg total) by mouth every morning. 30 tablet 0   mycophenolate (CELLCEPT) 500 MG tablet Take 1,000 mg by mouth 2 (two) times daily. For myasthenia gravis     olmesartan (BENICAR) 20 MG tablet Take 1 tablet (20 mg total) by mouth daily. 30 tablet 0   ondansetron (ZOFRAN) 4 MG tablet Take 1 tablet (4 mg total) by mouth every 8 (eight) hours as needed for nausea  or vomiting. 20 tablet 1   predniSONE (DELTASONE) 2.5 MG tablet Take 7.5 mg by mouth every morning. For myasthenia gravis     PRESCRIPTION MEDICATION Pt uses a cpap nightly     PROAIR HFA 108 (90 BASE) MCG/ACT inhaler Inhale 2 puffs into the lungs every 6 (six) hours as needed for wheezing or shortness of breath.      riTUXimab (RITUXAN) 500 MG/50ML injection Inject into the vein every 6 (six) months.     rosuvastatin (CRESTOR) 40 MG tablet Take 1 tablet (40 mg total) by mouth daily. 90 tablet 3   topiramate (TOPAMAX) 50 MG tablet Take 1 tablet (50 mg total) by mouth at bedtime. 90 tablet 3   traMADol (ULTRAM) 50 MG tablet Take 1 tablet (50 mg total) by mouth every 6 (six) hours as needed for moderate pain. 25 tablet 0   vitamin B-12 (CYANOCOBALAMIN) 1000 MCG tablet Take 1,000 mcg by mouth daily.     Vitamin D, Ergocalciferol, (DRISDOL) 50000 UNITS CAPS capsule Take 50,000 Units by mouth every Monday.      No current facility-administered medications for this visit.    PHYSICAL EXAMINATION: ECOG PERFORMANCE STATUS: 2 - Symptomatic, <50% confined to bed  Vitals:   09/24/22 0944  BP: (!) 160/64  Pulse: 90  Resp: 19  Temp: 97.8 F (36.6 C)  SpO2: 98%   Wt Readings from Last 3 Encounters:  09/24/22 196 lb 11.2 oz (89.2 kg)  09/02/22 199 lb 6.4 oz (90.4 kg)  08/28/22 199 lb 6.4 oz (90.4 kg)     GENERAL:alert, no distress and comfortable SKIN: skin color normal, no rashes or significant lesions EYES: normal, Conjunctiva are pink and non-injected,  sclera clear  NEURO: alert & oriented x 3 with fluent speech Breast:  left breast incision is healing a little pinkish.    LABORATORY DATA:  I have reviewed the data as listed    Latest Ref Rng & Units 09/03/2022    4:42 AM 08/12/2022    8:17 AM 10/24/2021    1:56 AM  CBC  WBC 4.0 - 10.5 K/uL 10.4  8.0  7.3   Hemoglobin 12.0 - 15.0 g/dL 9.1  10.4  7.9   Hematocrit 36.0 - 46.0 % 28.7  32.1  25.6   Platelets 150 - 400 K/uL 253  260  209         Latest Ref Rng & Units 09/03/2022    4:42 AM 08/12/2022    8:17 AM 10/24/2021    1:56 AM  CMP  Glucose 70 - 99 mg/dL 317  177  324   BUN 8 - 23 mg/dL '25  16  20   '$ Creatinine 0.44 - 1.00 mg/dL 1.08  1.22  1.08   Sodium 135 - 145 mmol/L 136  141  137   Potassium 3.5 - 5.1 mmol/L 4.3  3.2  3.5   Chloride 98 - 111 mmol/L 103  112  109   CO2 22 - 32 mmol/L '21  22  17   '$ Calcium 8.9 - 10.3 mg/dL 8.4  9.0  8.4   Total Protein 6.5 - 8.1 g/dL  7.1    Total Bilirubin 0.3 - 1.2 mg/dL  0.4    Alkaline Phos 38 - 126 U/L  127    AST 15 - 41 U/L  20    ALT 0 - 44 U/L  18        RADIOGRAPHIC STUDIES: I have personally reviewed the radiological images as listed and  agreed with the findings in the report. No results found.    No orders of the defined types were placed in this encounter.  All questions were answered. The patient knows to call the clinic with any problems, questions or concerns. No barriers to learning was detected. The total time spent in the appointment was 40 minutes.     Truitt Merle, MD 09/24/2022   Felicity Coyer, CMA, am acting as scribe for Truitt Merle, MD.   I have reviewed the above documentation for accuracy and completeness, and I agree with the above.

## 2022-09-23 NOTE — Assessment & Plan Note (Signed)
-  She is on Rituxan, CellCept and prednisone.

## 2022-09-23 NOTE — Assessment & Plan Note (Addendum)
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 -I discussed the aggressive nature of triple negative disease.  She has moderate risk of recurrence given the early stage disease. -I discussed the benefit of adjuvant chemotherapy, such as docetaxel and cyclophosphamide every 3 weeks for 4 cycles, or weekly Taxol for 12 weeks.  Due to her medical comorbidities, she is not a good candidate for intensive chemotherapy.

## 2022-09-24 ENCOUNTER — Inpatient Hospital Stay: Payer: Medicare Other | Attending: Hematology | Admitting: Hematology

## 2022-09-24 ENCOUNTER — Encounter: Payer: Self-pay | Admitting: *Deleted

## 2022-09-24 ENCOUNTER — Encounter: Payer: Self-pay | Admitting: Hematology

## 2022-09-24 ENCOUNTER — Other Ambulatory Visit: Payer: Self-pay

## 2022-09-24 VITALS — BP 160/64 | HR 90 | Temp 97.8°F | Resp 19 | Ht 63.0 in | Wt 196.7 lb

## 2022-09-24 DIAGNOSIS — Z8673 Personal history of transient ischemic attack (TIA), and cerebral infarction without residual deficits: Secondary | ICD-10-CM | POA: Diagnosis not present

## 2022-09-24 DIAGNOSIS — Z7952 Long term (current) use of systemic steroids: Secondary | ICD-10-CM | POA: Diagnosis not present

## 2022-09-24 DIAGNOSIS — Z5111 Encounter for antineoplastic chemotherapy: Secondary | ICD-10-CM | POA: Diagnosis not present

## 2022-09-24 DIAGNOSIS — Z79620 Long term (current) use of immunosuppressive biologic: Secondary | ICD-10-CM | POA: Insufficient documentation

## 2022-09-24 DIAGNOSIS — G7000 Myasthenia gravis without (acute) exacerbation: Secondary | ICD-10-CM | POA: Insufficient documentation

## 2022-09-24 DIAGNOSIS — C50412 Malignant neoplasm of upper-outer quadrant of left female breast: Secondary | ICD-10-CM | POA: Insufficient documentation

## 2022-09-24 DIAGNOSIS — Z79624 Long term (current) use of inhibitors of nucleotide synthesis: Secondary | ICD-10-CM | POA: Insufficient documentation

## 2022-09-24 DIAGNOSIS — Z170 Estrogen receptor positive status [ER+]: Secondary | ICD-10-CM | POA: Insufficient documentation

## 2022-09-25 DIAGNOSIS — G4733 Obstructive sleep apnea (adult) (pediatric): Secondary | ICD-10-CM | POA: Diagnosis not present

## 2022-09-28 NOTE — Progress Notes (Incomplete)
Radiation Oncology         [phone] ________________________________  Name: [name]        MRN: [id number]  Date of Service: 10/01/2022 DOB: 06/24/1951  KG:URKYHCW, [name], DO  Truitt Merle, MD     REFERRING PHYSICIAN: Truitt Merle, MD   DIAGNOSIS: The encounter diagnosis was Malignant neoplasm of upper-outer quadrant of left breast in female, estrogen receptor positive ([place]).   HISTORY OF PRESENT ILLNESS: [name] is a 72 y.o. female originally seen in the multidisciplinary breast clinic for a new diagnosis of left breast cancer. The patient was noted to have screening detected group of masses in the left breast.  By diagnostic ultrasound in the 2 o'clock position of the left breast was a 7 mm round mass corresponding to the mammogram findings with internal calcifications.  In the 3 o'clock position there was a 7 mm irregular mass with irregular margins also correlating to the findings by imaging.  Her left axilla was negative for adenopathy.  Biopsies on 08/03/2022 were taken in the 2:00, 230 o'clock, and 3 o'clock position.  The 3:00 biopsy showed stromal fibrosis negative for malignancy.  At 2:30 the biopsy showed intraductal papilloma with apocrine metaplasia.  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  Her cancer was ER positive weak staining PR negative, HER2 negative with a Ki-67 of 5%.    She proceeded with lumpectomy and sentinel node biopsy on 09/02/2022.  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  Her invasive cancer measured up to 1 cm.  Her margins were negative for invasive disease and DCIS.  4 lymph nodes were examined and all were negative for metastatic disease.  Her tumor was retested for prognostics and was ER negative PR negative HER2 negative with a Ki-67 of 10%.  She met with Dr. Burr Medico and discussed systemic chemotherapy in the adjuvant setting.  She  is not quite sure how to proceed since she receives Rituxan and IV Ig for myasthenia with Dr. Nanine Means at [facility].     PREVIOUS RADIATION THERAPY: No   PAST MEDICAL HISTORY:  Past Medical History:  Diagnosis Date   Arthritis    "all over my body" (03/13/2013)   Asthma    Asymptomatic carotid artery stenosis, bilateral 10/08/2018   Breast cancer (HCC)    GERD (gastroesophageal reflux disease)    H/O hiatal hernia    Headache    pt states she has had headaches for about 6 months "off and on"   Heart murmur    pt had echocardiogram on 09/17/21   Hypercholesterolemia 10/08/2018   Hypertension    Hypothyroidism    Myasthenia gravis ([place])    "in my eyes; diagnsosed > 7 yr ago" (03/13/2013)   Sleep apnea    on CPAP   Stroke ([place]) 2021   Thyroid carcinoma ([place])    Type II diabetes mellitus ([place])        PAST SURGICAL HISTORY: Past Surgical History:  Procedure Laterality Date   ABDOMINAL HYSTERECTOMY     ANTERIOR CERVICAL DECOMP/DISCECTOMY FUSION     "I've had severa ORs; always went in from the front" (03/13/2013)   APPENDECTOMY     BREAST LUMPECTOMY WITH RADIOACTIVE SEED AND SENTINEL LYMPH NODE BIOPSY Left 09/02/2022   Procedure: LEFT BREAST LUMPECTOMY WITH RADIOACTIVE SEED AND SENTINEL LYMPH NODE BIOPSY;  Surgeon: Coralie Keens, MD;  Location: [facility];  Service: General;  Laterality: Left;   CARDIAC CATHETERIZATION     "several" (03/13/2013)   CARPAL TUNNEL RELEASE Right    CATARACT EXTRACTION W/ INTRAOCULAR LENS  IMPLANT, BILATERAL Bilateral    CHOLECYSTECTOMY     KNEE ARTHROSCOPY Left    SHOULDER ARTHROSCOPY W/ ROTATOR CUFF REPAIR Left    TONSILLECTOMY     TOTAL THYROIDECTOMY     TRANSCAROTID ARTERY REVASCULARIZATION  Left 09/24/2021   Procedure: LEFT TRANSCAROTID ARTERY REVASCULARIZATION;  Surgeon: Cherre Robins, MD;  Location: [facility] OR;  Service: Vascular;  Laterality: Left;   TRANSCAROTID ARTERY REVASCULARIZATION  Right 10/23/2021   Procedure: Right Transcarotid Artery  Revascularization;  Surgeon: Cherre Robins, MD;  Location: [facility];  Service: Vascular;  Laterality: Right;   ULTRASOUND GUIDANCE FOR VASCULAR ACCESS Right 09/24/2021   Procedure: ULTRASOUND GUIDANCE FOR VASCULAR ACCESS;  Surgeon: Cherre Robins, MD;  Location: [facility] OR;  Service: Vascular;  Laterality: Right;   ULTRASOUND GUIDANCE FOR VASCULAR ACCESS Right 10/23/2021   Procedure: ULTRASOUND GUIDANCE FOR VASCULAR ACCESS, LEFT FEMORAL VEIN;  Surgeon: Cherre Robins, MD;  Location: [facility] OR;  Service: Vascular;  Laterality: Right;     FAMILY HISTORY:  Family History  Problem Relation Age of [place]   Coronary artery disease Sister        s/p coronary stenting   Stroke Sister 39   Diabetes Sister    Congestive Heart Failure Mother    Asthma Mother    Diabetes Mother    Congestive Heart Failure Father    Lung cancer Father    Asthma Father    Diabetes Father    Diabetes Brother      SOCIAL HISTORY:  reports that she has never smoked. She has never used smokeless tobacco. She reports that she does not drink alcohol and does not use drugs. The patient is single and lives in [place]. She is accompanied by her sister . She lives independently but does not drive given her neurologic history.    ALLERGIES: Contrast media [iodinated contrast media], Fluorescein, Iodine, Molds & smuts, Shellfish-derived products, Pholcodine, Azithromycin, Codeine, Metformin hcl er, Tramadol hcl, and Other   MEDICATIONS:  Current Outpatient Medications  Medication Sig Dispense Refill   acetaminophen (TYLENOL) 500 MG tablet Take 500 mg by mouth every 6 (six) hours as needed for moderate pain.     ALPRAZolam (XANAX) 0.25 MG tablet Take 1 tablet (0.25 mg total) by mouth at bedtime as needed for anxiety. (Patient not taking: Reported on 08/25/2022) 2 tablet 0   aspirin EC 81 MG EC tablet Take 1 tablet (81 mg total) by mouth daily.     Biotin 5000 MCG CAPS Take 5,000 mcg by mouth daily with breakfast.       cycloSPORINE (RESTASIS) 0.05 % ophthalmic emulsion Place 1 drop into both eyes 2 (two) times daily. 0.4 mL 0   diclofenac Sodium (VOLTAREN) 1 % GEL Apply 2 g topically 4 (four) times daily. (Patient taking differently: Apply 1 application  topically daily as needed (pain).) 2 g 0   diphenhydrAMINE 25 mg in sodium chloride 0.9 % 50 mL Inject 25 mg into the vein See admin instructions. Administer 25 mg intravenously at start of Gamunex infusion, then administer 25 mg during infusion     ELDERBERRY PO Take 1 tablet by mouth daily.     empagliflozin (JARDIANCE) 25 MG TABS tablet Take 25 mg by mouth daily.     esomeprazole (NEXIUM) 40 MG capsule Take 1 capsule (40 mg total) by mouth  2 (two) times daily. 60 capsule 0   Evolocumab with Infusor ([place]) 420 MG/3.5ML SOCT Inject 420 mg into the skin every 30 (thirty) days.     Exenatide ER (BYDUREON BCISE) 2 MG/0.85ML AUIJ Inject 2 mg into the skin every Monday.     ezetimibe (ZETIA) 10 MG tablet TAKE 1 TABLET BY MOUTH EVERY DAY 90 tablet 1   furosemide (LASIX) 40 MG tablet Take 40 mg by mouth daily as needed for edema.     ibuprofen (ADVIL) 800 MG tablet Take 800 mg by mouth every 8 (eight) hours as needed for moderate pain.     Immune Globulin, Human, 40 GM/400ML SOLN Inject into the vein every 6 (six) weeks. Infusions are administered at home by home health nurse - last infusion 09/08/21 and 09/09/21; next infusion due 10/19/21 and 10/20/21     insulin degludec (TRESIBA FLEXTOUCH) 100 UNIT/ML FlexTouch Pen Inject 15 Units into the skin in the morning.     Insulin Disposable Pump (OMNIPOD DASH 5 PACK PODS) MISC Use with Humalog     insulin lispro (HUMALOG) 100 UNIT/ML injection Inject into the skin See admin instructions. Inject subcutaneously 3 (three) times daily with meals With meals through the Omnipod Dash Pump every 72 hours as directed     levothyroxine (SYNTHROID) 175 MCG tablet Take 175 mcg by mouth daily before breakfast.      lidocaine (LIDODERM) 5 % Place 1 patch onto the skin daily. Remove & Discard patch within 12 hours or as directed by MD (Patient taking differently: Place 1 patch onto the skin daily as needed. Remove & Discard patch within 12 hours or as directed by MD) 30 patch 0   loratadine (CLARITIN) 10 MG tablet Take 1 tablet (10 mg total) by mouth at bedtime. (Patient not taking: Reported on 08/25/2022) 30 tablet 0   meclizine (ANTIVERT) 25 MG tablet Take 25 mg by mouth 3 (three) times daily as needed for dizziness or nausea (or migraine-related nausea).     montelukast (SINGULAIR) 10 MG tablet Take 1 tablet (10 mg total) by mouth every morning. 30 tablet 0   mycophenolate (CELLCEPT) 500 MG tablet Take 1,000 mg by mouth 2 (two) times daily. For myasthenia gravis     olmesartan (BENICAR) 20 MG tablet Take 1 tablet (20 mg total) by mouth daily. 30 tablet 0   ondansetron (ZOFRAN) 4 MG tablet Take 1 tablet (4 mg total) by mouth every 8 (eight) hours as needed for nausea or vomiting. 20 tablet 1   predniSONE (DELTASONE) 2.5 MG tablet Take 7.5 mg by mouth every morning. For myasthenia gravis     PRESCRIPTION MEDICATION Pt uses a cpap nightly     PROAIR HFA 108 (90 BASE) MCG/ACT inhaler Inhale 2 puffs into the lungs every 6 (six) hours as needed for wheezing or shortness of breath.      riTUXimab (RITUXAN) 500 MG/50ML injection Inject into the vein every 6 (six) months.     rosuvastatin (CRESTOR) 40 MG tablet Take 1 tablet (40 mg total) by mouth daily. 90 tablet 3   topiramate (TOPAMAX) 50 MG tablet Take 1 tablet (50 mg total) by mouth at bedtime. 90 tablet 3   traMADol (ULTRAM) 50 MG tablet Take 1 tablet (50 mg total) by mouth every 6 (six) hours as needed for moderate pain. 25 tablet 0   vitamin B-12 (CYANOCOBALAMIN) 1000 MCG tablet Take 1,000 mcg by mouth daily.     Vitamin D, Ergocalciferol, (DRISDOL) 50000 UNITS CAPS  capsule Take 50,000 Units by mouth every Monday.      No current facility-administered  medications for this visit.     REVIEW OF SYSTEMS: On review of systems, the patient reports she is doing okay. She reports she is always tired and feels this is her baseline. Her vision is quite impaired from her myasthenia and she walks with a walker. She reports some discomfort in her breast and had seroma aspiration on 09/18/22 but has not any any concerns of recurrence of this. She has had multiple calls from [facility] and isn't sure what she needs from them. No other complaints are verbalized.      PHYSICAL EXAM:  Wt Readings from Last 3 Encounters:  09/24/22 196 lb 11.2 oz (89.2 kg)  09/02/22 199 lb 6.4 oz (90.4 kg)  08/28/22 199 lb 6.4 oz (90.4 kg)   Temp Readings from Last 3 Encounters:  09/24/22 97.8 F (36.6 C) (Temporal)  09/04/22 98.5 F (36.9 C) (Oral)  08/28/22 98.1 F (36.7 C)   BP Readings from Last 3 Encounters:  09/24/22 (!) 160/64  09/04/22 (!) 119/57  08/28/22 (!) 142/84   Pulse Readings from Last 3 Encounters:  09/24/22 90  09/04/22 88  08/28/22 (!) 102    In general this is a well appearing African American female in no acute distress. She's alert and oriented x4 and appropriate throughout the examination. Cardiopulmonary assessment is negative for acute distress and she exhibits normal effort.   Her left breast incision is healing well with eschar in the breast incision site, but no findings are noted as abnormal in  her axillary incision site.  No erythema, separation, or drainage is noted at either site. She does have several dilated Pores of [place] in the central aspect of her chest along the level of her sternum. These are not disrupted.    ECOG = 1  0 - Asymptomatic (Fully active, able to carry on all predisease activities without restriction)  1 - Symptomatic but completely ambulatory (Restricted in physically strenuous activity but ambulatory and able to carry out work of a light or sedentary nature. For example, light housework, office  work)  2 - Symptomatic, <50% in bed during the day (Ambulatory and capable of all self care but unable to carry out any work activities. Up and about more than 50% of waking hours)  3 - Symptomatic, >50% in bed, but not bedbound (Capable of only limited self-care, confined to bed or chair 50% or more of waking hours)  4 - Bedbound (Completely disabled. Cannot carry on any self-care. Totally confined to bed or chair)  5 - Death   Eustace Pen MM, Creech RH, Tormey DC, et al. [phone]). "Toxicity and response criteria of the [facility]". [place] Oncol. 5 (6): 649-55    LABORATORY DATA:  Lab Results  Component Value Date   WBC 10.4 09/03/2022   HGB 9.1 (L) 09/03/2022   HCT 28.7 (L) 09/03/2022   MCV 95.0 09/03/2022   PLT 253 09/03/2022   Lab Results  Component Value Date   NA 136 09/03/2022   K 4.3 09/03/2022   CL 103 09/03/2022   CO2 21 (L) 09/03/2022   Lab Results  Component Value Date   ALT 18 08/12/2022   AST 20 08/12/2022   ALKPHOS 127 (H) 08/12/2022   BILITOT 0.4 08/12/2022      RADIOGRAPHY: No results found.     IMPRESSION/PLAN: 1. Stage IB, pT1bN0M0, grade 2, triple negative  invasive ductal carcinoma of the left breast. Dr. Lisbeth Renshaw has reviewed her final pathology findings and we reviewed the nature of early stage left breast disease.  Given the findings and concerns for functionally triple negative breast cancer, medical oncology recommended repeating her studies which did confirm triple negative disease.  She met with Dr. Burr Medico to discuss systemic chemotherapy.  She is not quite sure how she'd like to proceed with systemic therapy. We discussed the risks, benefits, short, and long term effects of radiotherapy, as well as the curative intent, and the patient is interested in proceeding.  I reviewed the delivery and logistics of radiotherapy that Dr. Lisbeth Renshaw recommends 4 weeks of radiotherapy to the left breast with deep inspiration breath hold technique.  Written consent is obtained and placed in the chart, a copy was provided to the patient. We will delay simulation another week and a half or two so more healing can take place while she's also deciding how to proceed. I will reach out to Dr. Burr Medico as she has more questions she'd like to discuss about systemic therapy. 2. Insulin dependant diabetes. Due to concerns about function of her devices, we had previously reached out to her endocrinologist Dr. Chalmers Cater at [facility] and the plan has been to do to do fingerstick glucose checks if needed and give injections of her insulin during the phase of treatment. 3. Myasthenia. The patient is followed by Dr. Wendee Beavers at [facility] and takes cellcept, IV Ig, and Rituxan. I'll give Dr. Burr Medico his information as the patient would like them to discuss her case prior to deciding about systemic therapy. 4. Dermatologic concerns. I gave her the name of a dermatologist in town that might be able to address her dilated pores that she is concerned about. We will follow this expectantly.  In a visit lasting 60 minutes, greater than 50% of the time was spent face to face reviewing her case, as well as in preparation of, discussing, and coordinating the patient's care.  The above documentation reflects my direct findings during this shared patient visit. Please see the separate note by Dr. Lisbeth Renshaw on this date for the remainder of the patient's plan of care.    Carola Rhine, [facility]    **Disclaimer: This note was dictated with voice recognition software. Similar sounding words can inadvertently be transcribed and this note may contain transcription errors which may not have been corrected upon publication of note.**

## 2022-10-01 ENCOUNTER — Encounter: Payer: Self-pay | Admitting: Radiation Oncology

## 2022-10-01 ENCOUNTER — Ambulatory Visit
Admission: RE | Admit: 2022-10-01 | Discharge: 2022-10-01 | Disposition: A | Discharge status: Home / Self Care | Payer: Medicare Other | Source: Ambulatory Visit | Attending: Radiation Oncology | Admitting: Radiation Oncology

## 2022-10-01 ENCOUNTER — Other Ambulatory Visit: Payer: Self-pay

## 2022-10-01 ENCOUNTER — Ambulatory Visit: Payer: Medicare Other | Admitting: Radiation Oncology

## 2022-10-01 VITALS — BP 161/80 | HR 100 | Temp 97.7°F | Resp 20 | Ht 63.0 in | Wt 196.8 lb

## 2022-10-01 DIAGNOSIS — Z7952 Long term (current) use of systemic steroids: Secondary | ICD-10-CM | POA: Insufficient documentation

## 2022-10-01 DIAGNOSIS — E039 Hypothyroidism, unspecified: Secondary | ICD-10-CM | POA: Insufficient documentation

## 2022-10-01 DIAGNOSIS — Z7985 Long-term (current) use of injectable non-insulin antidiabetic drugs: Secondary | ICD-10-CM | POA: Diagnosis not present

## 2022-10-01 DIAGNOSIS — G4730 Sleep apnea, unspecified: Secondary | ICD-10-CM | POA: Diagnosis not present

## 2022-10-01 DIAGNOSIS — G7000 Myasthenia gravis without (acute) exacerbation: Secondary | ICD-10-CM | POA: Insufficient documentation

## 2022-10-01 DIAGNOSIS — Z170 Estrogen receptor positive status [ER+]: Secondary | ICD-10-CM

## 2022-10-01 DIAGNOSIS — Z801 Family history of malignant neoplasm of trachea, bronchus and lung: Secondary | ICD-10-CM | POA: Insufficient documentation

## 2022-10-01 DIAGNOSIS — Z171 Estrogen receptor negative status [ER-]: Secondary | ICD-10-CM | POA: Insufficient documentation

## 2022-10-01 DIAGNOSIS — R011 Cardiac murmur, unspecified: Secondary | ICD-10-CM | POA: Diagnosis not present

## 2022-10-01 DIAGNOSIS — Z79624 Long term (current) use of inhibitors of nucleotide synthesis: Secondary | ICD-10-CM | POA: Diagnosis not present

## 2022-10-01 DIAGNOSIS — Z7982 Long term (current) use of aspirin: Secondary | ICD-10-CM | POA: Insufficient documentation

## 2022-10-01 DIAGNOSIS — I10 Essential (primary) hypertension: Secondary | ICD-10-CM | POA: Diagnosis not present

## 2022-10-01 DIAGNOSIS — Z791 Long term (current) use of non-steroidal anti-inflammatories (NSAID): Secondary | ICD-10-CM | POA: Insufficient documentation

## 2022-10-01 DIAGNOSIS — C50412 Malignant neoplasm of upper-outer quadrant of left female breast: Secondary | ICD-10-CM | POA: Diagnosis not present

## 2022-10-01 DIAGNOSIS — E7800 Pure hypercholesterolemia, unspecified: Secondary | ICD-10-CM | POA: Diagnosis not present

## 2022-10-01 DIAGNOSIS — Z7984 Long term (current) use of oral hypoglycemic drugs: Secondary | ICD-10-CM | POA: Insufficient documentation

## 2022-10-01 DIAGNOSIS — E119 Type 2 diabetes mellitus without complications: Secondary | ICD-10-CM | POA: Insufficient documentation

## 2022-10-01 DIAGNOSIS — Z79890 Hormone replacement therapy (postmenopausal): Secondary | ICD-10-CM | POA: Insufficient documentation

## 2022-10-01 DIAGNOSIS — K219 Gastro-esophageal reflux disease without esophagitis: Secondary | ICD-10-CM | POA: Insufficient documentation

## 2022-10-01 DIAGNOSIS — Z8673 Personal history of transient ischemic attack (TIA), and cerebral infarction without residual deficits: Secondary | ICD-10-CM | POA: Diagnosis not present

## 2022-10-01 DIAGNOSIS — Z79899 Other long term (current) drug therapy: Secondary | ICD-10-CM | POA: Insufficient documentation

## 2022-10-01 NOTE — Progress Notes (Signed)
Nursing interview for Malignant neoplasm of upper-outer quadrant of left breast in female, estrogen receptor positive ([place]).  I verified patient's identity and began nursing interview. Patient reports LT breast discomfort 5/10, incision line healing well. No other issues reported at this time.  Meaningful use complete. Hysterectomy.  BP (!) 161/80 (BP Location: Right Arm, Patient Position: Sitting, Cuff Size: Large)   Pulse 100   Temp 97.7 F (36.5 C) (Temporal)   Resp 20   Ht '5\' 3"'$  (1.6 m)   Wt 196 lb 12.8 oz (89.3 kg)   SpO2 100%   BMI 34.86 kg/m   This concludes the interview.   Leandra Kern, LPN

## 2022-10-01 NOTE — Addendum Note (Signed)
Encounter addended by: Hayden Pedro, PA-C on: 10/01/2022 3:28 PM  Actions taken: Clinical Note Signed

## 2022-10-05 ENCOUNTER — Telehealth: Payer: Self-pay | Admitting: Radiation Oncology

## 2022-10-05 NOTE — Telephone Encounter (Signed)
The patient called and we discussed her concerns about treatment. She is still wanting to talk with Dr. Burr Medico and her team again prior to deciding about chemotherapy. She did speak with her team at [facility] and they felt like she could still proceed with rituxan and her IV Ig. We will keep her scheduled simulation for next week until a decision is made.

## 2022-10-07 ENCOUNTER — Other Ambulatory Visit: Payer: Self-pay | Admitting: Hematology

## 2022-10-07 DIAGNOSIS — Z170 Estrogen receptor positive status [ER+]: Secondary | ICD-10-CM

## 2022-10-07 MED ORDER — PROCHLORPERAZINE MALEATE 10 MG PO TABS: 10.0000 mg | ORAL_TABLET | Freq: Four times a day (QID) | ORAL | 1 refills | Status: DC | PRN

## 2022-10-07 MED ORDER — ONDANSETRON HCL 8 MG PO TABS: 8.0000 mg | ORAL_TABLET | Freq: Three times a day (TID) | ORAL | 1 refills | Status: DC | PRN

## 2022-10-07 NOTE — Progress Notes (Signed)
START OFF PATHWAY REGIMEN - Breast   [id number]:Nab-paclitaxel 80 mg/m2 IV D1,8,15 q28 Days:   A cycle is every 28 days:     Nab-paclitaxel (protein bound)   **Always confirm dose/schedule in your pharmacy ordering system**  Patient Characteristics: Postoperative without Neoadjuvant Therapy (Pathologic Staging), Invasive Disease, Adjuvant Therapy, HER2 Negative, ER Positive, Node Negative, pT1a, pN86m or pT1b-c, pN0/N138mor pT2 or Higher, pN0, Genomic Testing Not Performed, Chemotherapy Preferred Therapeutic Status: Postoperative without Neoadjuvant Therapy (Pathologic Staging) AJCC Grade: G2 AJCC N Category: pN0 AJCC M Category: cM0 ER Status: Positive (+) AJCC 8 Stage Grouping: IA HER2 Status: Negative (-) Oncotype Dx Recurrence Score: Not Appropriate AJCC T Category: pT1b PR Status: Negative (-) Has this patient completed genomic testing<= No - Did Not Order Test  Treatment Preferred: Chemotherapy Intent of Therapy: Curative Intent, Discussed with Patient

## 2022-10-08 ENCOUNTER — Telehealth: Payer: Self-pay | Admitting: Hematology

## 2022-10-08 ENCOUNTER — Other Ambulatory Visit: Payer: Self-pay

## 2022-10-08 NOTE — Telephone Encounter (Signed)
Contacted patient to scheduled appointments. Patient is aware of appointments that are scheduled.   

## 2022-10-09 ENCOUNTER — Encounter: Payer: Self-pay | Admitting: *Deleted

## 2022-10-13 ENCOUNTER — Encounter (HOSPITAL_COMMUNITY): Payer: Self-pay

## 2022-10-13 ENCOUNTER — Telehealth: Payer: Self-pay

## 2022-10-13 NOTE — Telephone Encounter (Signed)
Spoke with pt via telephone regarding appts in March 13th appt regarding IVIG and Chemotherapy.  Received message back from Dr. Marlana Salvage regarding pt's IVIG and Dr. Marlana Salvage agreed with Dr. Burr Medico that the appointments does not need to be adjusted.  Informed pt of Dr. Verdie Mosher and Dr. Ernestina Penna decisions.  Pt verbalized understanding and had no further questions or concerns.

## 2022-10-14 ENCOUNTER — Ambulatory Visit: Payer: Medicare Other | Admitting: Radiation Oncology

## 2022-10-14 NOTE — Progress Notes (Signed)
Pharmacist Chemotherapy Monitoring - Initial Assessment    Anticipated start date: 10/21/22   The following has been reviewed per standard work regarding the patient's treatment regimen: The patient's diagnosis, treatment plan and drug doses, and organ/hematologic function Lab orders and baseline tests specific to treatment regimen  The treatment plan start date, drug sequencing, and pre-medications Prior authorization status  Patient's documented medication list, including drug-drug interaction screen and prescriptions for anti-emetics and supportive care specific to the treatment regimen The drug concentrations, fluid compatibility, administration routes, and timing of the medications to be used The patient's access for treatment and lifetime cumulative dose history, if applicable  The patient's medication allergies and previous infusion related reactions, if applicable   Changes made to treatment plan:  N/A  Follow up needed:  N/A   Kennith Center, Pharm.D., CPP 10/14/2022@11$ :32 AM

## 2022-10-15 ENCOUNTER — Inpatient Hospital Stay: Payer: Medicare Other

## 2022-10-15 ENCOUNTER — Inpatient Hospital Stay: Payer: Medicare Other | Admitting: Pharmacist

## 2022-10-15 ENCOUNTER — Other Ambulatory Visit: Payer: Self-pay

## 2022-10-15 DIAGNOSIS — G7000 Myasthenia gravis without (acute) exacerbation: Secondary | ICD-10-CM | POA: Diagnosis not present

## 2022-10-15 DIAGNOSIS — C50412 Malignant neoplasm of upper-outer quadrant of left female breast: Secondary | ICD-10-CM | POA: Diagnosis not present

## 2022-10-15 DIAGNOSIS — Z79620 Long term (current) use of immunosuppressive biologic: Secondary | ICD-10-CM | POA: Diagnosis not present

## 2022-10-15 DIAGNOSIS — Z170 Estrogen receptor positive status [ER+]: Secondary | ICD-10-CM

## 2022-10-15 DIAGNOSIS — Z5111 Encounter for antineoplastic chemotherapy: Secondary | ICD-10-CM | POA: Diagnosis not present

## 2022-10-15 DIAGNOSIS — Z8673 Personal history of transient ischemic attack (TIA), and cerebral infarction without residual deficits: Secondary | ICD-10-CM | POA: Diagnosis not present

## 2022-10-15 DIAGNOSIS — Z79624 Long term (current) use of inhibitors of nucleotide synthesis: Secondary | ICD-10-CM | POA: Diagnosis not present

## 2022-10-15 DIAGNOSIS — Z7952 Long term (current) use of systemic steroids: Secondary | ICD-10-CM | POA: Diagnosis not present

## 2022-10-15 MED ORDER — LIDOCAINE-PRILOCAINE 2.5-2.5 % EX CREA: TOPICAL_CREAM | CUTANEOUS | 3 refills | Status: DC

## 2022-10-15 NOTE — Progress Notes (Signed)
[place]       Telephone: [phone]?Fax: [phone]   Oncology Clinical Pharmacist Practitioner Initial Assessment  [name] is a 72 y.o. female with a diagnosis of breast cancer. They were contacted today via in-person visit.  She is accompanied by her sister [name].  Indication/Regimen Albumin-bound paclitaxel (Abraxane) is being used appropriately for treatment of breast cancer by Dr. Truitt [name].      Wt Readings from Last 1 Encounters:  10/01/22 196 lb 12.8 oz (89.3 kg)    Estimated body surface area is 1.99 meters squared as calculated from the following:   Height as of 10/01/22: 5' 3"$  (1.6 m).   Weight as of 10/01/22: 196 lb 12.8 oz (89.3 kg).  The dosing regimen is a 28 day cycle for 4 cycles  Albumin-bound paclitaxel (100 mg/m2) IV on Day(s) 1, 8, 15  Dose Modifications None at this time   Allergies Allergies  Allergen Reactions   Contrast Media [Iodinated Contrast Media] Anaphylaxis    01/10/15 and 09/18/2021 --PT GIVEN 13 HR PRE MEDS FOR CT with contrast TOLERATED IV CONTRAST W/O ANY REACTION   Fluorescein Shortness Of Breath and Other (See Comments)    (Dye)   Iodine Anaphylaxis    Contrast dye - iodine   Molds & Smuts Shortness Of Breath and Other (See Comments)    Congestion and wheezing, also   Shellfish-Derived Products Anaphylaxis, Shortness Of Breath, Swelling and Other (See Comments)    Welts, also   Pholcodine Hives   Azithromycin Nausea Only   Codeine Hives and Nausea Only   Metformin Hcl Er Nausea Only   Oxycodone Nausea Only    Nausea 08/2022 admission.   Tramadol Hcl Nausea Only   Other Rash and Other (See Comments)    Coban causes welts, also   Vitals = no vitals or labs were done today at this chemotherapy education visit  Contraindications Contraindications were reviewed? Yes Contraindications to therapy were identified? No   Safety Precautions The following safety precautions for the use of albumin-bound  paclitaxel were reviewed:  Fever: reviewed the importance of having a thermometer and the Centers for Disease Control and Prevention (CDC) definition of fever which is 100.70F (38C) or higher. Patient should call 24/7 triage at [phone] if experiencing a fever or any other symptoms Decreased white blood cells (WBCs) and increased risk for infection Decreased platelet count and increased risk of bleeding Decreased hemoglobin, part of the red blood cells that carry iron and oxygen Hair Loss (alopecia) Fatigue Numbness or tingling in hands and feet Nausea or vomiting Diarrhea (loose and/or urgent bowel movements) Change in liver function Rash or itchy skin Muscle or joint pain or weakness Fluid retention or swelling (edema) Allergic reactions Pneumonitis Vision changes or pain in your eyes Avoid grapefruit products Handling body fluids and waste Intimacy, sexual activity, contraception, fertility, breastfeeding  Medication Reconciliation Current Outpatient Medications  Medication Sig Dispense Refill   aspirin EC 81 MG EC tablet Take 1 tablet (81 mg total) by mouth daily.     Biotin 5000 MCG CAPS Take 5,000 mcg by mouth daily with breakfast.      cycloSPORINE (RESTASIS) 0.05 % ophthalmic emulsion Place 1 drop into both eyes 2 (two) times daily. 0.4 mL 0   diclofenac Sodium (VOLTAREN) 1 % GEL Apply 2 g topically 4 (four) times daily. (Patient taking differently: Apply 1 application  topically daily as needed (pain).) 2 g 0   diphenhydrAMINE 25 mg in sodium  chloride 0.9 % 50 mL Inject 25 mg into the vein See admin instructions. Administer 25 mg intravenously at start of Gamunex infusion, then administer 25 mg during infusion     ELDERBERRY PO Take 1 tablet by mouth daily.     empagliflozin (JARDIANCE) 25 MG TABS tablet Take 25 mg by mouth daily.     esomeprazole (NEXIUM) 40 MG capsule Take 1 capsule (40 mg total) by mouth 2 (two) times daily. 60 capsule 0   Exenatide ER (BYDUREON  BCISE) 2 MG/0.85ML AUIJ Inject 2 mg into the skin every Monday.     ezetimibe (ZETIA) 10 MG tablet TAKE 1 TABLET BY MOUTH EVERY DAY 90 tablet 1   furosemide (LASIX) 40 MG tablet Take 40 mg by mouth daily as needed for edema.     Immune Globulin, Human, 40 GM/400ML SOLN Inject into the vein every 6 (six) weeks. Infusions are administered at home by home health nurse - last infusion 09/08/21 and 09/09/21; next infusion due 10/19/21 and 10/20/21     insulin degludec (TRESIBA FLEXTOUCH) 100 UNIT/ML FlexTouch Pen Inject 15 Units into the skin in the morning.     insulin lispro (HUMALOG) 100 UNIT/ML injection Inject into the skin See admin instructions. Inject subcutaneously 3 (three) times daily with meals With meals through the Omnipod Dash Pump every 72 hours as directed     levothyroxine (SYNTHROID) 175 MCG tablet Take 175 mcg by mouth daily before breakfast.     montelukast (SINGULAIR) 10 MG tablet Take 1 tablet (10 mg total) by mouth every morning. 30 tablet 0   mycophenolate (CELLCEPT) 500 MG tablet Take 1,000 mg by mouth 2 (two) times daily. For myasthenia gravis     olmesartan (BENICAR) 20 MG tablet Take 1 tablet (20 mg total) by mouth daily. 30 tablet 0   predniSONE (DELTASONE) 2.5 MG tablet Take 7.5 mg by mouth every morning. For myasthenia gravis     PRESCRIPTION MEDICATION Pt uses a cpap nightly     PROAIR HFA 108 (90 BASE) MCG/ACT inhaler Inhale 2 puffs into the lungs every 6 (six) hours as needed for wheezing or shortness of breath.      riTUXimab (RITUXAN) 500 MG/50ML injection Inject into the vein every 6 (six) months.     rosuvastatin (CRESTOR) 40 MG tablet Take 1 tablet (40 mg total) by mouth daily. 90 tablet 3   topiramate (TOPAMAX) 50 MG tablet Take 1 tablet (50 mg total) by mouth at bedtime. 90 tablet 3   vitamin B-12 (CYANOCOBALAMIN) 1000 MCG tablet Take 1,000 mcg by mouth daily.     Vitamin D, Ergocalciferol, (DRISDOL) 50000 UNITS CAPS capsule Take 50,000 Units by mouth every Monday.       acetaminophen (TYLENOL) 500 MG tablet Take 500 mg by mouth every 6 (six) hours as needed for moderate pain. (Patient not taking: Reported on 10/15/2022)     Evolocumab with Infusor ([place]) 420 MG/3.5ML SOCT Inject 420 mg into the skin every 30 (thirty) days. (Patient not taking: Reported on 10/15/2022)     Insulin Disposable Pump (OMNIPOD DASH 5 PACK PODS) MISC Use with Humalog (Patient not taking: Reported on 10/15/2022)     lidocaine-prilocaine (EMLA) cream Apply to affected area once (Patient not taking: Reported on 10/15/2022) 30 g 3   loratadine (CLARITIN) 10 MG tablet Take 1 tablet (10 mg total) by mouth at bedtime. (Patient not taking: Reported on 08/25/2022) 30 tablet 0   meclizine (ANTIVERT) 25 MG tablet Take 25 mg by  mouth 3 (three) times daily as needed for dizziness or nausea (or migraine-related nausea). (Patient not taking: Reported on 10/15/2022)     ondansetron (ZOFRAN) 8 MG tablet Take 1 tablet (8 mg total) by mouth every 8 (eight) hours as needed for nausea or vomiting. 30 tablet 1   prochlorperazine (COMPAZINE) 10 MG tablet Take 1 tablet (10 mg total) by mouth every 6 (six) hours as needed for nausea or vomiting. (Patient not taking: Reported on 10/15/2022) 30 tablet 1   traMADol (ULTRAM) 50 MG tablet Take 1 tablet (50 mg total) by mouth every 6 (six) hours as needed for moderate pain. (Patient not taking: Reported on 10/15/2022) 25 tablet 0   No current facility-administered medications for this visit.   Medication reconciliation is based on the patient's most recent medication list in the electronic medical record (EMR) including herbal products and OTC medications.   The patient's medication list was reviewed today with the patient? Yes   Drug-drug interactions (DDIs) DDIs were evaluated? Yes Significant DDIs identified? No   Drug-Food Interactions Drug-food interactions were evaluated? Yes Drug-food interactions identified? Avoid grapefruit  products  Follow-up Plan  Treatment start date: 10/21/22 [facility] placement date: Patient believes port was initially placed on 12/19/19 which is what is in EMR. She has it accessed when she receives IVIG at home per her report. EMLA cream sent to her local pharmacy of choice. Patient reports having HSRs with IVIG. She receives prednisone and diphenhydramine at home prior to these infusions. Will forward visit summary from today to Dr. Burr Medico to see if she would possibly like to add diphenhydramine and/or dexamethasone to premedications prior to her albumin-bound paclitaxel. We did discuss with patient that HSR risk is lower with albumin-bound paclitaxel compared to paclitaxel but there is still some risk. Of note, patient is diabetic.  Per patient report, she sees Dr. Jolinda Croak at [facility] about every 3 months who is managing her Myasthenia Gravis Clinical pharmacy will assist Dr. Truitt [name] and [name] on an as needed basis going forward  [name] participated in the discussion, expressed understanding, and voiced agreement with the above plan. All questions were answered to her satisfaction. The patient was advised to contact the clinic at [phone] with any questions or concerns prior to her return visit.   I spent 60 minutes assessing the patient.  Raina Mina, RPH-CPP, 10/15/2022 9:02 AM  **Disclaimer: This note was dictated with voice recognition software. Similar sounding words can inadvertently be transcribed and this note may contain transcription errors which may not have been corrected upon publication of note.**

## 2022-10-20 NOTE — Assessment & Plan Note (Signed)
-  She is on Rituxan, CellCept and prednisone.  will continue during chemo  -I discussed with her neurologist

## 2022-10-20 NOTE — Assessment & Plan Note (Signed)
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 -I recommend adjuvant chemo with weekly abraxane X12 if she can tolerate  -

## 2022-10-21 ENCOUNTER — Inpatient Hospital Stay: Payer: Medicare Other

## 2022-10-21 ENCOUNTER — Encounter: Payer: Self-pay | Admitting: *Deleted

## 2022-10-21 ENCOUNTER — Inpatient Hospital Stay (HOSPITAL_BASED_OUTPATIENT_CLINIC_OR_DEPARTMENT_OTHER): Payer: Medicare Other | Admitting: Hematology

## 2022-10-21 ENCOUNTER — Other Ambulatory Visit: Payer: Self-pay

## 2022-10-21 ENCOUNTER — Encounter: Payer: Self-pay | Admitting: Hematology

## 2022-10-21 VITALS — BP 158/72 | HR 97 | Temp 98.0°F | Resp 18 | Ht 63.0 in | Wt 198.1 lb

## 2022-10-21 VITALS — BP 164/74 | HR 99 | Resp 18

## 2022-10-21 DIAGNOSIS — G7000 Myasthenia gravis without (acute) exacerbation: Secondary | ICD-10-CM

## 2022-10-21 DIAGNOSIS — C50412 Malignant neoplasm of upper-outer quadrant of left female breast: Secondary | ICD-10-CM

## 2022-10-21 DIAGNOSIS — Z8673 Personal history of transient ischemic attack (TIA), and cerebral infarction without residual deficits: Secondary | ICD-10-CM | POA: Diagnosis not present

## 2022-10-21 DIAGNOSIS — Z79624 Long term (current) use of inhibitors of nucleotide synthesis: Secondary | ICD-10-CM | POA: Diagnosis not present

## 2022-10-21 DIAGNOSIS — Z5111 Encounter for antineoplastic chemotherapy: Secondary | ICD-10-CM | POA: Diagnosis not present

## 2022-10-21 DIAGNOSIS — Z170 Estrogen receptor positive status [ER+]: Secondary | ICD-10-CM

## 2022-10-21 DIAGNOSIS — Z79620 Long term (current) use of immunosuppressive biologic: Secondary | ICD-10-CM | POA: Diagnosis not present

## 2022-10-21 DIAGNOSIS — Z7952 Long term (current) use of systemic steroids: Secondary | ICD-10-CM | POA: Diagnosis not present

## 2022-10-21 DIAGNOSIS — Z95828 Presence of other vascular implants and grafts: Secondary | ICD-10-CM | POA: Insufficient documentation

## 2022-10-21 LAB — CBC WITH DIFFERENTIAL (CANCER CENTER ONLY)
Abs Immature Granulocytes: 0.02 10*3/uL (ref 0.00–0.07)
Basophils Absolute: 0 10*3/uL (ref 0.0–0.1)
Basophils Relative: 1 %
Eosinophils Absolute: 0.2 10*3/uL (ref 0.0–0.5)
Eosinophils Relative: 3 %
HCT: 29.6 % — ABNORMAL LOW (ref 36.0–46.0)
Hemoglobin: 9.8 g/dL — ABNORMAL LOW (ref 12.0–15.0)
Immature Granulocytes: 0 %
Lymphocytes Relative: 23 %
Lymphs Abs: 1.4 10*3/uL (ref 0.7–4.0)
MCH: 30.8 pg (ref 26.0–34.0)
MCHC: 33.1 g/dL (ref 30.0–36.0)
MCV: 93.1 fL (ref 80.0–100.0)
Monocytes Absolute: 0.6 10*3/uL (ref 0.1–1.0)
Monocytes Relative: 9 %
Neutro Abs: 4.1 10*3/uL (ref 1.7–7.7)
Neutrophils Relative %: 64 %
Platelet Count: 237 10*3/uL (ref 150–400)
RBC: 3.18 MIL/uL — ABNORMAL LOW (ref 3.87–5.11)
RDW: 13.1 % (ref 11.5–15.5)
WBC Count: 6.4 10*3/uL (ref 4.0–10.5)
nRBC: 0 % (ref 0.0–0.2)

## 2022-10-21 LAB — CMP (CANCER CENTER ONLY)
ALT: 18 U/L (ref 0–44)
AST: 16 U/L (ref 15–41)
Albumin: 3.7 g/dL (ref 3.5–5.0)
Alkaline Phosphatase: 128 U/L — ABNORMAL HIGH (ref 38–126)
Anion gap: 7 (ref 5–15)
BUN: 22 mg/dL (ref 8–23)
CO2: 21 mmol/L — ABNORMAL LOW (ref 22–32)
Calcium: 8.8 mg/dL — ABNORMAL LOW (ref 8.9–10.3)
Chloride: 113 mmol/L — ABNORMAL HIGH (ref 98–111)
Creatinine: 1.01 mg/dL — ABNORMAL HIGH (ref 0.44–1.00)
GFR, Estimated: >60 mL/min (ref >60)
Glucose, Bld: 186 mg/dL — ABNORMAL HIGH (ref 70–99)
Potassium: 3.7 mmol/L (ref 3.5–5.1)
Sodium: 141 mmol/L (ref 135–145)
Total Bilirubin: 0.4 mg/dL (ref 0.3–1.2)
Total Protein: 6.4 g/dL — ABNORMAL LOW (ref 6.5–8.1)

## 2022-10-21 MED ORDER — PROCHLORPERAZINE MALEATE 10 MG PO TABS
10.0000 mg | ORAL_TABLET | Freq: Once | ORAL | Status: AC
  Administered 2022-10-21: 10 mg via ORAL
  Filled 2022-10-21: qty 1

## 2022-10-21 MED ORDER — SODIUM CHLORIDE 0.9% FLUSH
10.0000 mL | Freq: Once | INTRAVENOUS | Status: AC
  Administered 2022-10-21: 10 mL

## 2022-10-21 MED ORDER — HEPARIN SOD (PORK) LOCK FLUSH 100 UNIT/ML IV SOLN
500.0000 [IU] | Freq: Once | INTRAVENOUS | Status: AC | PRN
  Administered 2022-10-21: 500 [IU]

## 2022-10-21 MED ORDER — SODIUM CHLORIDE 0.9 % IV SOLN: Freq: Once | INTRAVENOUS | Status: AC

## 2022-10-21 MED ORDER — SODIUM CHLORIDE 0.9% FLUSH
10.0000 mL | INTRAVENOUS | Status: DC | PRN
  Administered 2022-10-21: 10 mL

## 2022-10-21 MED ORDER — PACLITAXEL PROTEIN-BOUND CHEMO INJECTION 100 MG
80.0000 mg/m2 | Freq: Once | INTRAVENOUS | Status: AC
  Administered 2022-10-21: 150 mg via INTRAVENOUS
  Filled 2022-10-21: qty 30

## 2022-10-21 NOTE — Progress Notes (Signed)
[place]   Telephone:([phone] [phone]   Clinic Follow up Note   Patient Care Team: [name], DO as PCP - General (Family Medicine) Garvin Fila, MD as Consulting Physician (Neurology) Frann Rider, NP as Nurse Practitioner (Neurology) Rockwell Germany, RN as Oncology Nurse Navigator Mauro Kaufmann, RN as Oncology Nurse Navigator Coralie Keens, MD as Consulting Physician (General Surgery) Truitt Merle, MD as Consulting Physician (Hematology) Kyung Rudd, MD as Consulting Physician (Radiation Oncology) Ian Malkin, MD as Consulting Physician (Rheumatology)  Date of Service:  10/21/2022  CHIEF COMPLAINT: f/u of left breast cancer   CURRENT THERAPY:  Adjuvant chemotherapy with Abraxane '80mg'$ /m2 weekly for 12 weeks, starting today  ASSESSMENT:  [name] is a 72 y.o. 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 -I recommend adjuvant chemo with weekly abraxane X12 if she can tolerate  -We again discussed the potential side effect and management, especially the risk of infection due to her compromised immune system, he voiced good understanding. -I also encouraged her to use ice packs for her hands and feet during her chemoinfusion, to reduce risk of neuropathy  Myasthenia gravis ([place]) -She is on Rituxan every 6 months, CellCept and prednisone.  will continue during chemo  -I discussed with her neurologist   Anemia of chronic disease -Baseline hemoglobin 9-10.4 -We discussed that her anemia may get worse after chemo, and she may need blood transfusion. -She will take a prenatal multivitamin daily  PLAN: -Lab reviewed,  adequate for treatment, will proceed for cycle Abraxane today -Follow-up next week before second dose, then every 2 weeks    SUMMARY OF ONCOLOGIC HISTORY: Oncology History  Malignant neoplasm of upper-outer quadrant of left breast in female, estrogen receptor positive ([place])  08/10/2022 Initial Diagnosis   Malignant neoplasm of upper-outer quadrant of left female breast ([place])   08/10/2022 Cancer Staging   Staging form: Breast, AJCC 8th Edition - Clinical: Stage IB (cT1b, cN0, cM0, G3, ER+, PR-, HER2-) - Signed by Hayden Pedro, PA-C on 08/10/2022 Stage prefix: Initial diagnosis Method of lymph node assessment: Clinical Histologic grading system: 3 grade system   09/02/2022 Cancer Staging   Staging form: Breast, AJCC 8th Edition - Pathologic stage from 09/02/2022: Stage IB (pT1b, pN0, cM0, G2, ER-, PR-, HER2-) - Signed by Truitt Merle, MD on 09/23/2022 Stage prefix: Initial diagnosis Histologic grading system: 3 grade system Residual tumor (R): R0 - None   10/21/2022 -  Chemotherapy   Patient is on Treatment Plan : BREAST PACLitaxel-Albumin (Abraxane) (100) D1,8,15 q28d        INTERVAL HISTORY:  [name] is here for a follow up of breast cancer. She was last seen by me on 09/24/2022. She presents to the clinic accompanied by her sister.  She is clinically stable, no new complaints.  She has healed well from surgery, saw Dr. Ninfa Linden last week.  She has had a chemo class.   All other systems were reviewed with the patient and are negative.  MEDICAL HISTORY:  Past Medical History:  Diagnosis Date   Arthritis    "all over my body" (03/13/2013)   Asthma    Asymptomatic carotid artery stenosis, bilateral 10/08/2018   Breast cancer (  HCC)    GERD (gastroesophageal reflux disease)    H/O hiatal hernia    Headache    pt states she has had headaches for about 6 months "off and on"   Heart murmur    pt had echocardiogram on 09/17/21   Hypercholesterolemia 10/08/2018    Hypertension    Hypothyroidism    Myasthenia gravis ([place])    "in my eyes; diagnsosed > 7 yr ago" (03/13/2013)   Sleep apnea    on CPAP   Stroke ([place]) 2021   Thyroid carcinoma ([place])    Type II diabetes mellitus ([place])     SURGICAL HISTORY: Past Surgical History:  Procedure Laterality Date   ABDOMINAL HYSTERECTOMY     ANTERIOR CERVICAL DECOMP/DISCECTOMY FUSION     "I've had severa ORs; always went in from the front" (03/13/2013)   APPENDECTOMY     BREAST LUMPECTOMY WITH RADIOACTIVE SEED AND SENTINEL LYMPH NODE BIOPSY Left 09/02/2022   Procedure: LEFT BREAST LUMPECTOMY WITH RADIOACTIVE SEED AND SENTINEL LYMPH NODE BIOPSY;  Surgeon: Coralie Keens, MD;  Location: [place];  Service: General;  Laterality: Left;   CARDIAC CATHETERIZATION     "several" (03/13/2013)   CARPAL TUNNEL RELEASE Right    CATARACT EXTRACTION W/ INTRAOCULAR LENS  IMPLANT, BILATERAL Bilateral    CHOLECYSTECTOMY     KNEE ARTHROSCOPY Left    SHOULDER ARTHROSCOPY W/ ROTATOR CUFF REPAIR Left    TONSILLECTOMY     TOTAL THYROIDECTOMY     TRANSCAROTID ARTERY REVASCULARIZATION  Left 09/24/2021   Procedure: LEFT TRANSCAROTID ARTERY REVASCULARIZATION;  Surgeon: Cherre Robins, MD;  Location: [facility] OR;  Service: Vascular;  Laterality: Left;   TRANSCAROTID ARTERY REVASCULARIZATION  Right 10/23/2021   Procedure: Right Transcarotid Artery Revascularization;  Surgeon: Cherre Robins, MD;  Location: [place];  Service: Vascular;  Laterality: Right;   ULTRASOUND GUIDANCE FOR VASCULAR ACCESS Right 09/24/2021   Procedure: ULTRASOUND GUIDANCE FOR VASCULAR ACCESS;  Surgeon: Cherre Robins, MD;  Location: [facility];  Service: Vascular;  Laterality: Right;   ULTRASOUND GUIDANCE FOR VASCULAR ACCESS Right 10/23/2021   Procedure: ULTRASOUND GUIDANCE FOR VASCULAR ACCESS, LEFT FEMORAL VEIN;  Surgeon: Cherre Robins, MD;  Location: [facility] OR;  Service: Vascular;  Laterality: Right;    I have reviewed the social history and family history with the patient  and they are unchanged from previous note.  ALLERGIES:  is allergic to contrast media [iodinated contrast media], fluorescein, iodine, molds & smuts, shellfish-derived products, pholcodine, azithromycin, codeine, metformin hcl er, oxycodone, tramadol hcl, and other.  MEDICATIONS:  Current Outpatient Medications  Medication Sig Dispense Refill   acetaminophen (TYLENOL) 500 MG tablet Take 500 mg by mouth every 6 (six) hours as needed for moderate pain. (Patient not taking: Reported on 10/15/2022)     aspirin EC 81 MG EC tablet Take 1 tablet (81 mg total) by mouth daily.     Biotin 5000 MCG CAPS Take 5,000 mcg by mouth daily with breakfast.      cycloSPORINE (RESTASIS) 0.05 % ophthalmic emulsion Place 1 drop into both eyes 2 (two) times daily. 0.4 mL 0   diclofenac Sodium (VOLTAREN) 1 % GEL Apply 2 g topically 4 (four) times daily. (Patient taking differently: Apply 1 application  topically daily as needed (pain).) 2 g 0   diphenhydrAMINE 25 mg in sodium chloride 0.9 % 50 mL Inject 25 mg into the vein See admin instructions. Administer 25 mg intravenously at start of Gamunex infusion, then administer 25 mg during infusion  ELDERBERRY PO Take 1 tablet by mouth daily.     empagliflozin (JARDIANCE) 25 MG TABS tablet Take 25 mg by mouth daily.     esomeprazole (NEXIUM) 40 MG capsule Take 1 capsule (40 mg total) by mouth 2 (two) times daily. 60 capsule 0   Evolocumab with Infusor ([place]) 420 MG/3.5ML SOCT Inject 420 mg into the skin every 30 (thirty) days. (Patient not taking: Reported on 10/15/2022)     Exenatide ER (BYDUREON BCISE) 2 MG/0.85ML AUIJ Inject 2 mg into the skin every Monday.     ezetimibe (ZETIA) 10 MG tablet TAKE 1 TABLET BY MOUTH EVERY DAY 90 tablet 1   furosemide (LASIX) 40 MG tablet Take 40 mg by mouth daily as needed for edema.     Immune Globulin, Human, 40 GM/400ML SOLN Inject into the vein every 6 (six) weeks. Infusions are administered at home by home health  nurse - last infusion 09/08/21 and 09/09/21; next infusion due 10/19/21 and 10/20/21     insulin degludec (TRESIBA FLEXTOUCH) 100 UNIT/ML FlexTouch Pen Inject 15 Units into the skin in the morning.     Insulin Disposable Pump (OMNIPOD DASH 5 PACK PODS) MISC Use with Humalog (Patient not taking: Reported on 10/15/2022)     insulin lispro (HUMALOG) 100 UNIT/ML injection Inject into the skin See admin instructions. Inject subcutaneously 3 (three) times daily with meals With meals through the Omnipod Dash Pump every 72 hours as directed     levothyroxine (SYNTHROID) 175 MCG tablet Take 175 mcg by mouth daily before breakfast.     lidocaine-prilocaine (EMLA) cream Apply to affected area once (Patient not taking: Reported on 10/15/2022) 30 g 3   loratadine (CLARITIN) 10 MG tablet Take 1 tablet (10 mg total) by mouth at bedtime. (Patient not taking: Reported on 08/25/2022) 30 tablet 0   meclizine (ANTIVERT) 25 MG tablet Take 25 mg by mouth 3 (three) times daily as needed for dizziness or nausea (or migraine-related nausea). (Patient not taking: Reported on 10/15/2022)     montelukast (SINGULAIR) 10 MG tablet Take 1 tablet (10 mg total) by mouth every morning. 30 tablet 0   mycophenolate (CELLCEPT) 500 MG tablet Take 1,000 mg by mouth 2 (two) times daily. For myasthenia gravis     olmesartan (BENICAR) 20 MG tablet Take 1 tablet (20 mg total) by mouth daily. 30 tablet 0   ondansetron (ZOFRAN) 8 MG tablet Take 1 tablet (8 mg total) by mouth every 8 (eight) hours as needed for nausea or vomiting. 30 tablet 1   predniSONE (DELTASONE) 2.5 MG tablet Take 7.5 mg by mouth every morning. For myasthenia gravis     PRESCRIPTION MEDICATION Pt uses a cpap nightly     PROAIR HFA 108 (90 BASE) MCG/ACT inhaler Inhale 2 puffs into the lungs every 6 (six) hours as needed for wheezing or shortness of breath.      prochlorperazine (COMPAZINE) 10 MG tablet Take 1 tablet (10 mg total) by mouth every 6 (six) hours as needed for nausea or  vomiting. (Patient not taking: Reported on 10/15/2022) 30 tablet 1   riTUXimab (RITUXAN) 500 MG/50ML injection Inject into the vein every 6 (six) months.     rosuvastatin (CRESTOR) 40 MG tablet Take 1 tablet (40 mg total) by mouth daily. 90 tablet 3   topiramate (TOPAMAX) 50 MG tablet Take 1 tablet (50 mg total) by mouth at bedtime. 90 tablet 3   traMADol (ULTRAM) 50 MG tablet Take 1 tablet (50 mg total) by  mouth every 6 (six) hours as needed for moderate pain. (Patient not taking: Reported on 10/15/2022) 25 tablet 0   vitamin B-12 (CYANOCOBALAMIN) 1000 MCG tablet Take 1,000 mcg by mouth daily.     Vitamin D, Ergocalciferol, (DRISDOL) 50000 UNITS CAPS capsule Take 50,000 Units by mouth every Monday.      No current facility-administered medications for this visit.   Facility-Administered Medications Ordered in Other Visits  Medication Dose Route Frequency Provider Last Rate Last Admin   0.9 %  sodium chloride infusion   Intravenous Once Truitt Merle, MD       heparin lock flush 100 unit/mL  500 Units Intracatheter Once PRN Truitt Merle, MD       PACLitaxel-protein bound (ABRAXANE) chemo infusion 150 mg  80 mg/m2 (Treatment Plan Recorded) Intravenous Once Truitt Merle, MD       prochlorperazine (COMPAZINE) tablet 10 mg  10 mg Oral Once Truitt Merle, MD       sodium chloride flush (NS) 0.9 % injection 10 mL  10 mL Intracatheter PRN Truitt Merle, MD        PHYSICAL EXAMINATION: ECOG PERFORMANCE STATUS: 1 - Symptomatic but completely ambulatory  Vitals:   10/21/22 1012  BP: (!) 158/72  Pulse: 97  Resp: 18  Temp: 98 F (36.7 C)  SpO2: 100%   Wt Readings from Last 3 Encounters:  10/21/22 198 lb 1.6 oz (89.9 kg)  10/01/22 196 lb 12.8 oz (89.3 kg)  09/24/22 196 lb 11.2 oz (89.2 kg)     GENERAL:alert, no distress and comfortable SKIN: skin color, texture, turgor are normal, no rashes or significant lesions EYES: normal, Conjunctiva are pink and non-injected, sclera clear NECK: supple, thyroid normal  size, non-tender, without nodularity LYMPH:  no palpable lymphadenopathy in the cervical, axillary  LUNGS: clear to auscultation and percussion with normal breathing effort HEART: regular rate & rhythm and no murmurs and no lower extremity edema ABDOMEN:abdomen soft, non-tender and normal bowel sounds Musculoskeletal:no cyanosis of digits and no clubbing  NEURO: alert & oriented x 3 with fluent speech, no focal motor/sensory deficits  LABORATORY DATA:  I have reviewed the data as listed    Latest Ref Rng & Units 10/21/2022    9:51 AM 09/03/2022    4:42 AM 08/12/2022    8:17 AM  CBC  WBC 4.0 - 10.5 K/uL 6.4  10.4  8.0   Hemoglobin 12.0 - 15.0 g/dL 9.8  9.1  10.4   Hematocrit 36.0 - 46.0 % 29.6  28.7  32.1   Platelets 150 - 400 K/uL 237  253  260         Latest Ref Rng & Units 10/21/2022    9:51 AM 09/03/2022    4:42 AM 08/12/2022    8:17 AM  CMP  Glucose 70 - 99 mg/dL 186  317  177   BUN 8 - 23 mg/dL '22  25  16   '$ Creatinine 0.44 - 1.00 mg/dL 1.01  1.08  1.22   Sodium 135 - 145 mmol/L 141  136  141   Potassium 3.5 - 5.1 mmol/L 3.7  4.3  3.2   Chloride 98 - 111 mmol/L 113  103  112   CO2 22 - 32 mmol/L '21  21  22   '$ Calcium 8.9 - 10.3 mg/dL 8.8  8.4  9.0   Total Protein 6.5 - 8.1 g/dL 6.4   7.1   Total Bilirubin 0.3 - 1.2 mg/dL 0.4   0.4   Alkaline Phos  38 - 126 U/L 128   127   AST 15 - 41 U/L 16   20   ALT 0 - 44 U/L 18   18       RADIOGRAPHIC STUDIES: I have personally reviewed the radiological images as listed and agreed with the findings in the report. No results found.    Orders Placed This Encounter  Procedures   CBC with Differential ([place] Only)    Standing Status:   Future    Standing Expiration Date:   11/19/2023   CMP ([place] only)    Standing Status:   Future    Standing Expiration Date:   11/19/2023   CBC with Differential ([facility] Only)    Standing Status:   Future    Standing Expiration Date:   11/26/2023   CMP ([place] only)     Standing Status:   Future    Standing Expiration Date:   11/26/2023   CBC with Differential ([place] Only)    Standing Status:   Future    Standing Expiration Date:   12/03/2023   CMP ([place] only)    Standing Status:   Future    Standing Expiration Date:   12/03/2023   All questions were answered. The patient knows to call the clinic with any problems, questions or concerns. No barriers to learning was detected. The total time spent in the appointment was 30 minutes.     Truitt Merle, MD 10/21/2022

## 2022-10-21 NOTE — Patient Instructions (Signed)
[facility]  Discharge Instructions: Thank you for choosing [facility] to provide your oncology and hematology care.   If you have a lab appointment with the [facility], please go directly to the [facility] and check in at the registration area.   Wear comfortable clothing and clothing appropriate for easy access to any Portacath or PICC line.   We strive to give you quality time with your provider. You may need to reschedule your appointment if you arrive late (15 or more minutes).  Arriving late affects you and other patients whose appointments are after yours.  Also, if you miss three or more appointments without notifying the office, you may be dismissed from the clinic at the provider's discretion.      For prescription refill requests, have your pharmacy contact our office and allow 72 hours for refills to be completed.    Today you received the following chemotherapy and/or immunotherapy agents Abraxane      To help prevent nausea and vomiting after your treatment, we encourage you to take your nausea medication as directed.  BELOW ARE SYMPTOMS THAT SHOULD BE REPORTED IMMEDIATELY: *FEVER GREATER THAN 100.4 F (38 C) OR HIGHER *CHILLS OR SWEATING *NAUSEA AND VOMITING THAT IS NOT CONTROLLED WITH YOUR NAUSEA MEDICATION *UNUSUAL SHORTNESS OF BREATH *UNUSUAL BRUISING OR BLEEDING *URINARY PROBLEMS (pain or burning when urinating, or frequent urination) *BOWEL PROBLEMS (unusual diarrhea, constipation, pain near the anus) TENDERNESS IN MOUTH AND THROAT WITH OR WITHOUT PRESENCE OF ULCERS (sore throat, sores in mouth, or a toothache) UNUSUAL RASH, SWELLING OR PAIN  UNUSUAL VAGINAL DISCHARGE OR ITCHING   Items with * indicate a potential emergency and should be followed up as soon as possible or go to the Emergency Department if any problems should occur.  Please show the CHEMOTHERAPY ALERT CARD or IMMUNOTHERAPY ALERT CARD at  check-in to the Emergency Department and triage nurse.  Should you have questions after your visit or need to cancel or reschedule your appointment, please contact [facility]  Dept: [phone]  and follow the prompts.  Office hours are 8:00 a.m. to 4:30 p.m. Monday - Friday. Please note that voicemails left after 4:00 p.m. may not be returned until the following business day.  We are closed weekends and major holidays. You have access to a nurse at all times for urgent questions. Please call the main number to the clinic Dept: [phone] and follow the prompts.   For any non-urgent questions, you may also contact your provider using MyChart. We now offer e-Visits for anyone 77 and older to request care online for non-urgent symptoms. For details visit [email or web address].   Also download the MyChart app! Go to the app store, search "MyChart", open the app, select [facility], and log in with your MyChart username and password.   Paclitaxel Nanoparticle Albumin-Bound Injection What is this medication? NANOPARTICLE ALBUMIN-BOUND PACLITAXEL (Na no PAHR ti kuhl al BYOO muhn-bound PAK li TAX el) treats some types of cancer. It works by slowing down the growth of cancer cells. This medicine may be used for other purposes; ask your health care provider or pharmacist if you have questions. COMMON BRAND NAME(S): Abraxane What should I tell my care team before I take this medication? They need to know if you have any of these conditions: Liver disease Low white blood cell levels An unusual or allergic reaction to paclitaxel, albumin, other medications, foods, dyes, or preservatives  If you or your partner are pregnant or trying to get pregnant Breast-feeding How should I use this medication? This medication is injected into a vein. It is given by your care team in a hospital or clinic setting. Talk to your care team about the use of this medication in  children. Special care may be needed. Overdosage: If you think you have taken too much of this medicine contact a poison control center or emergency room at once. NOTE: This medicine is only for you. Do not share this medicine with others. What if I miss a dose? Keep appointments for follow-up doses. It is important not to miss your dose. Call your care team if you are unable to keep an appointment. What may interact with this medication? Other medications may affect the way this medication works. Talk with your care team about all of the medications you take. They may suggest changes to your treatment plan to lower the risk of side effects and to make sure your medications work as intended. This list may not describe all possible interactions. Give your health care provider a list of all the medicines, herbs, non-prescription drugs, or dietary supplements you use. Also tell them if you smoke, drink alcohol, or use illegal drugs. Some items may interact with your medicine. What should I watch for while using this medication? Your condition will be monitored carefully while you are receiving this medication. You may need blood work while taking this medication. This medication may make you feel generally unwell. This is not uncommon as chemotherapy can affect healthy cells as well as cancer cells. Report any side effects. Continue your course of treatment even though you feel ill unless your care team tells you to stop. This medication can cause serious allergic reactions. To reduce the risk, your care team may give you other medications to take before receiving this one. Be sure to follow the directions from your care team. This medication may increase your risk of getting an infection. Call your care team for advice if you get a fever, chills, sore throat, or other symptoms of a cold or flu. Do not treat yourself. Try to avoid being around people who are sick. This medication may increase your risk to  bruise or bleed. Call your care team if you notice any unusual bleeding. Be careful brushing or flossing your teeth or using a toothpick because you may get an infection or bleed more easily. If you have any dental work done, tell your dentist you are receiving this medication. Talk to your care team if you or your partner may be pregnant. Serious birth defects can occur if you take this medication during pregnancy and for 6 months after the last dose. You will need a negative pregnancy test before starting this medication. Contraception is recommended while taking this medication and for 6 months after the last dose. Your care team can help you find the option that works for you. If your partner can get pregnant, use a condom during sex while taking this medication and for 3 months after the last dose. Do not breastfeed while taking this medication and for 2 weeks after the last dose. This medication may cause infertility. Talk to your care team if you are concerned about your fertility. What side effects may I notice from receiving this medication? Side effects that you should report to your care team as soon as possible: Allergic reactions--skin rash, itching, hives, swelling of the face, lips, tongue, or throat Dry cough,  shortness of breath or trouble breathing Infection--fever, chills, cough, sore throat, wounds that don't heal, pain or trouble when passing urine, general feeling of discomfort or being unwell Low red blood cell level--unusual weakness or fatigue, dizziness, headache, trouble breathing Pain, tingling, or numbness in the hands or feet Stomach pain, unusual weakness or fatigue, nausea, vomiting, diarrhea, or fever that lasts longer than expected Unusual bruising or bleeding Side effects that usually do not require medical attention (report to your care team if they continue or are bothersome): Diarrhea Fatigue Hair loss Loss of appetite Nausea Vomiting This list may not  describe all possible side effects. Call your doctor for medical advice about side effects. You may report side effects to FDA at 1-800-FDA-1088. Where should I keep my medication? This medication is given in a hospital or clinic. It will not be stored at home. NOTE: This sheet is a summary. It may not cover all possible information. If you have questions about this medicine, talk to your doctor, pharmacist, or health care provider.  2023 Elsevier/Gold Standard (2007-10-01 00:00:00)

## 2022-10-22 ENCOUNTER — Telehealth: Payer: Self-pay

## 2022-10-22 NOTE — Telephone Encounter (Signed)
Ms Germain states that is is doing pretty well. She used the ice for hands to prevent neuropathy. Her right had had some pain tingling last evening. It has decreased a little but remains. She also had several episodes of diarrhea last evening. She would eat or drink and have loose stools and water coming out. She has not had any this am. She has not eaten yet. She is drinking at least 64 oz a day.  Told her to increase her fluid intake to 8 oz every 90 minutes today. She can try eating bananas, white rice applesauce and white toast to help bind her stools. She is afebrile.  Told her to call back to Dr. Ernestina Penna nurse if the diarrhea continues this am to see if she can take imodium. Pt knows the office number to call [phone].

## 2022-10-22 NOTE — Telephone Encounter (Signed)
Contacted pt regarding staff message sent to this RN & Dr. Burr Medico from Patient Education RN Baruch Merl.  Spoke with pt regarding c/o diarrhea.  Instructed pt to take Imodium AD for her diarrhea.  Stated to take 2 pills Q4hrs after episodes of diarrhea.  Aslo, instructed pt to drink Liquid IV, Gatorade, or Pedialyte to replace the electrolytes lost with each episode of diarrhea.  Pt verbalized understanding.  Pt c/o headache and asked if it was "OK" to take acetaminophen.  Informed pt that it is "OK" to take acetaminophen for the headache.  Asked pt if she's had n/v and taking antiemetic medications.  Pt denied n/v and taking antiemetics.  Instructed pt avoid foods that can increase diarrhea like fruits and dairy products especially since the pt is lactose intolerant.  Pt verbalized understanding and had no further questions or concerns at this time.

## 2022-10-22 NOTE — Telephone Encounter (Signed)
-----   Message from Odette Fraction, [place] sent at 10/21/2022  2:31 PM EST ----- Regarding: Dr. Burr Medico; First time Abraxane infusion Patient received first time Abraxane infusion. Tolerated infusion well with no complaints, vital signs stable at time of discharge. Follow up requested.

## 2022-10-23 ENCOUNTER — Other Ambulatory Visit: Payer: Self-pay

## 2022-10-23 ENCOUNTER — Telehealth: Payer: Self-pay

## 2022-10-23 NOTE — Telephone Encounter (Signed)
Pt called stating she's experiencing swelling in her bilateral feet.  Pt denied burning, tingling, pain, and hotness in her feet.  Pt stated she's having some pressure in her heels when walking for standing.  Pt stated she's aware of neuropathy since she's a diabetic.  Pt stated the swelling started yesterday 10/22/2022.  Pt states she's been elevating her feet but denies wearing compression socks or hoses.  Recommended pt wearing compression socks or hoses to help circulate fluids back to her heart.  Pt stated she has Furosemide (Lasix) that she took before for swelling.  Pt asked if it was "OK" to take now since she's on chemotherapy.  Informed pt that "Yes" she can take the Furosemide (Lasix) but not to take the full 40 mg since the pt had just recently experienced episodes of diarrhea which may have slightly depleted pt's K+.  Pt verbalized understanding of directions and will f/u with Dr. Ernestina Penna office is edema does not resolve.

## 2022-10-24 DIAGNOSIS — G4733 Obstructive sleep apnea (adult) (pediatric): Secondary | ICD-10-CM | POA: Diagnosis not present

## 2022-10-27 NOTE — Progress Notes (Unsigned)
Patient Care Team: [name], DO as PCP - General (Family Medicine) Garvin Fila, MD as Consulting Physician (Neurology) Frann Rider, NP as Nurse Practitioner (Neurology) Rockwell Germany, RN as Oncology Nurse Navigator Mauro Kaufmann, RN as Oncology Nurse Navigator Coralie Keens, MD as Consulting Physician (General Surgery) Truitt Merle, MD as Consulting Physician (Hematology) Kyung Rudd, MD as Consulting Physician (Radiation Oncology) Ian Malkin, MD as Consulting Physician (Rheumatology)   CHIEF COMPLAINT: Follow up left breast cancer   Oncology History  Malignant neoplasm of upper-outer quadrant of left breast in female, estrogen receptor positive ([facility])  08/10/2022 Initial Diagnosis   Malignant neoplasm of upper-outer quadrant of left female breast ([facility])   08/10/2022 Cancer Staging   Staging form: Breast, AJCC 8th Edition - Clinical: Stage IB (cT1b, cN0, cM0, G3, ER+, PR-, HER2-) - Signed by Hayden Pedro, PA-C on 08/10/2022 Stage prefix: Initial diagnosis Method of lymph node assessment: Clinical Histologic grading system: 3 grade system   09/02/2022 Cancer Staging   Staging form: Breast, AJCC 8th Edition - Pathologic stage from 09/02/2022: Stage IB (pT1b, pN0, cM0, G2, ER-, PR-, HER2-) - Signed by Truitt Merle, MD on 09/23/2022 Stage prefix: Initial diagnosis Histologic grading system: 3 grade system Residual tumor (R): R0 - None   10/21/2022 -  Chemotherapy   Patient is on Treatment Plan : BREAST PACLitaxel-Albumin (Abraxane) (100) D1,8,15 q28d        CURRENT THERAPY: Adjuvant chemotherapy with Abraxane '80mg'$ /m2 weekly for 12 weeks, starting 10/21/22  INTERVAL HISTORY [name] returns for follow up and treatment as scheduled. Last seen by Dr. Burr Medico 2/28 to begin chemo.   ROS   Past Medical History:  Diagnosis Date   Arthritis    "all over my body" (03/13/2013)   Asthma    Asymptomatic carotid artery stenosis, bilateral 10/08/2018   Breast  cancer (HCC)    GERD (gastroesophageal reflux disease)    H/O hiatal hernia    Headache    pt states she has had headaches for about 6 months "off and on"   Heart murmur    pt had echocardiogram on 09/17/21   Hypercholesterolemia 10/08/2018   Hypertension    Hypothyroidism    Myasthenia gravis ([facility])    "in my eyes; diagnsosed > 7 yr ago" (03/13/2013)   Sleep apnea    on CPAP   Stroke ([facility]) 2021   Thyroid carcinoma ([facility])    Type II diabetes mellitus ([facility])      Past Surgical History:  Procedure Laterality Date   ABDOMINAL HYSTERECTOMY     ANTERIOR CERVICAL DECOMP/DISCECTOMY FUSION     "I've had severa ORs; always went in from the front" (03/13/2013)   APPENDECTOMY     BREAST LUMPECTOMY WITH RADIOACTIVE SEED AND SENTINEL LYMPH NODE BIOPSY Left 09/02/2022   Procedure: LEFT BREAST LUMPECTOMY WITH RADIOACTIVE SEED AND SENTINEL LYMPH NODE BIOPSY;  Surgeon: Coralie Keens, MD;  Location: [facility];  Service: General;  Laterality: Left;   CARDIAC CATHETERIZATION     "several" (03/13/2013)   CARPAL TUNNEL RELEASE Right    CATARACT EXTRACTION W/ INTRAOCULAR LENS  IMPLANT, BILATERAL Bilateral    CHOLECYSTECTOMY     KNEE ARTHROSCOPY Left    SHOULDER ARTHROSCOPY W/ ROTATOR CUFF REPAIR Left    TONSILLECTOMY     TOTAL THYROIDECTOMY     TRANSCAROTID ARTERY REVASCULARIZATION  Left 09/24/2021   Procedure: LEFT TRANSCAROTID ARTERY REVASCULARIZATION;  Surgeon: Cherre Robins, MD;  Location: [facility];  Service: Vascular;  Laterality:  Left;   TRANSCAROTID ARTERY REVASCULARIZATION  Right 10/23/2021   Procedure: Right Transcarotid Artery Revascularization;  Surgeon: Cherre Robins, MD;  Location: [facility] OR;  Service: Vascular;  Laterality: Right;   ULTRASOUND GUIDANCE FOR VASCULAR ACCESS Right 09/24/2021   Procedure: ULTRASOUND GUIDANCE FOR VASCULAR ACCESS;  Surgeon: Cherre Robins, MD;  Location: [facility] OR;  Service: Vascular;  Laterality: Right;   ULTRASOUND GUIDANCE FOR VASCULAR ACCESS Right 10/23/2021    Procedure: ULTRASOUND GUIDANCE FOR VASCULAR ACCESS, LEFT FEMORAL VEIN;  Surgeon: Cherre Robins, MD;  Location: [facility];  Service: Vascular;  Laterality: Right;     Outpatient Encounter Medications as of 10/28/2022  Medication Sig Note   acetaminophen (TYLENOL) 500 MG tablet Take 500 mg by mouth every 6 (six) hours as needed for moderate pain. (Patient not taking: Reported on 10/15/2022)    aspirin EC 81 MG EC tablet Take 1 tablet (81 mg total) by mouth daily.    Biotin 5000 MCG CAPS Take 5,000 mcg by mouth daily with breakfast.     cycloSPORINE (RESTASIS) 0.05 % ophthalmic emulsion Place 1 drop into both eyes 2 (two) times daily.    diclofenac Sodium (VOLTAREN) 1 % GEL Apply 2 g topically 4 (four) times daily. (Patient taking differently: Apply 1 application  topically daily as needed (pain).)    diphenhydrAMINE 25 mg in sodium chloride 0.9 % 50 mL Inject 25 mg into the vein See admin instructions. Administer 25 mg intravenously at start of Gamunex infusion, then administer 25 mg during infusion    ELDERBERRY PO Take 1 tablet by mouth daily.    empagliflozin (JARDIANCE) 25 MG TABS tablet Take 25 mg by mouth daily.    esomeprazole (NEXIUM) 40 MG capsule Take 1 capsule (40 mg total) by mouth 2 (two) times daily.    Evolocumab with Infusor ([place]) 420 MG/3.5ML SOCT Inject 420 mg into the skin every 30 (thirty) days. (Patient not taking: Reported on 10/15/2022)    Exenatide ER (BYDUREON BCISE) 2 MG/0.85ML AUIJ Inject 2 mg into the skin every Monday.    ezetimibe (ZETIA) 10 MG tablet TAKE 1 TABLET BY MOUTH EVERY DAY    furosemide (LASIX) 40 MG tablet Take 40 mg by mouth daily as needed for edema.    Immune Globulin, Human, 40 GM/400ML SOLN Inject into the vein every 6 (six) weeks. Infusions are administered at home by home health nurse - last infusion 09/08/21 and 09/09/21; next infusion due 10/19/21 and 10/20/21 09/17/2021: Current dose is unknown by patient   insulin degludec (TRESIBA  FLEXTOUCH) 100 UNIT/ML FlexTouch Pen Inject 15 Units into the skin in the morning.    Insulin Disposable Pump (OMNIPOD DASH 5 PACK PODS) MISC Use with Humalog (Patient not taking: Reported on 10/15/2022)    insulin lispro (HUMALOG) 100 UNIT/ML injection Inject into the skin See admin instructions. Inject subcutaneously 3 (three) times daily with meals With meals through the Omnipod Dash Pump every 72 hours as directed    levothyroxine (SYNTHROID) 175 MCG tablet Take 175 mcg by mouth daily before breakfast.    lidocaine-prilocaine (EMLA) cream Apply to affected area once (Patient not taking: Reported on 10/15/2022)    loratadine (CLARITIN) 10 MG tablet Take 1 tablet (10 mg total) by mouth at bedtime. (Patient not taking: Reported on 08/25/2022)    meclizine (ANTIVERT) 25 MG tablet Take 25 mg by mouth 3 (three) times daily as needed for dizziness or nausea (or migraine-related nausea). (Patient not taking: Reported on 10/15/2022)  montelukast (SINGULAIR) 10 MG tablet Take 1 tablet (10 mg total) by mouth every morning.    mycophenolate (CELLCEPT) 500 MG tablet Take 1,000 mg by mouth 2 (two) times daily. For myasthenia gravis    olmesartan (BENICAR) 20 MG tablet Take 1 tablet (20 mg total) by mouth daily.    ondansetron (ZOFRAN) 8 MG tablet Take 1 tablet (8 mg total) by mouth every 8 (eight) hours as needed for nausea or vomiting.    predniSONE (DELTASONE) 2.5 MG tablet Take 7.5 mg by mouth every morning. For myasthenia gravis 09/17/2021: continuous   PRESCRIPTION MEDICATION Pt uses a cpap nightly    PROAIR HFA 108 (90 BASE) MCG/ACT inhaler Inhale 2 puffs into the lungs every 6 (six) hours as needed for wheezing or shortness of breath.     prochlorperazine (COMPAZINE) 10 MG tablet Take 1 tablet (10 mg total) by mouth every 6 (six) hours as needed for nausea or vomiting. (Patient not taking: Reported on 10/15/2022)    riTUXimab (RITUXAN) 500 MG/50ML injection Inject into the vein every 6 (six) months.  09/17/2021: Administered at [facility] - pt could not verify current dose   rosuvastatin (CRESTOR) 40 MG tablet Take 1 tablet (40 mg total) by mouth daily.    topiramate (TOPAMAX) 50 MG tablet Take 1 tablet (50 mg total) by mouth at bedtime.    traMADol (ULTRAM) 50 MG tablet Take 1 tablet (50 mg total) by mouth every 6 (six) hours as needed for moderate pain. (Patient not taking: Reported on 10/15/2022)    vitamin B-12 (CYANOCOBALAMIN) 1000 MCG tablet Take 1,000 mcg by mouth daily.    Vitamin D, Ergocalciferol, (DRISDOL) 50000 UNITS CAPS capsule Take 50,000 Units by mouth every Monday.     No facility-administered encounter medications on file as of 10/28/2022.     There were no vitals filed for this visit. There is no height or weight on file to calculate BMI.   PHYSICAL EXAM GENERAL:alert, no distress and comfortable SKIN: no rash  EYES: sclera clear NECK: without mass LYMPH:  no palpable cervical or supraclavicular lymphadenopathy  LUNGS: clear with normal breathing effort HEART: regular rate & rhythm, no lower extremity edema ABDOMEN: abdomen soft, non-tender and normal bowel sounds NEURO: alert & oriented x 3 with fluent speech, no focal motor/sensory deficits Breast exam:  PAC without erythema    CBC    Component Value Date/Time   WBC 6.4 10/21/2022 0951   WBC 10.4 09/03/2022 0442   RBC 3.18 (L) 10/21/2022 0951   HGB 9.8 (L) 10/21/2022 0951   HCT 29.6 (L) 10/21/2022 0951   PLT 237 10/21/2022 0951   MCV 93.1 10/21/2022 0951   MCH 30.8 10/21/2022 0951   MCHC 33.1 10/21/2022 0951   RDW 13.1 10/21/2022 0951   LYMPHSABS 1.4 10/21/2022 0951   MONOABS 0.6 10/21/2022 0951   EOSABS 0.2 10/21/2022 0951   BASOSABS 0.0 10/21/2022 0951     CMP     Component Value Date/Time   NA 141 10/21/2022 0951   NA 143 05/31/2019 1101   K 3.7 10/21/2022 0951   CL 113 (H) 10/21/2022 0951   CO2 21 (L) 10/21/2022 0951   GLUCOSE 186 (H) 10/21/2022 0951   BUN 22 10/21/2022 0951    BUN 24 05/31/2019 1101   CREATININE 1.01 (H) 10/21/2022 0951   CALCIUM 8.8 (L) 10/21/2022 0951   PROT 6.4 (L) 10/21/2022 0951   PROT 6.5 05/31/2019 1101   ALBUMIN 3.7 10/21/2022 0951   ALBUMIN 4.0  05/31/2019 1101   AST 16 10/21/2022 0951   ALT 18 10/21/2022 0951   ALKPHOS 128 (H) 10/21/2022 0951   BILITOT 0.4 10/21/2022 0951   GFRNONAA >60 10/21/2022 0951   GFRAA >60 12/28/2019 1159     ASSESSMENT & PLAN:  PLAN:  No orders of the defined types were placed in this encounter.     All questions were answered. The patient knows to call the clinic with any problems, questions or concerns. No barriers to learning were detected. I spent *** counseling the patient face to face. The total time spent in the appointment was *** and more than 50% was on counseling, review of test results, and coordination of care.   [name] Rue, NP-C '@DATE'$ @

## 2022-10-28 ENCOUNTER — Inpatient Hospital Stay: Payer: Medicare Other | Attending: Hematology | Admitting: Nurse Practitioner

## 2022-10-28 ENCOUNTER — Inpatient Hospital Stay: Payer: Medicare Other

## 2022-10-28 ENCOUNTER — Other Ambulatory Visit: Payer: Self-pay

## 2022-10-28 ENCOUNTER — Encounter: Payer: Self-pay | Admitting: Nurse Practitioner

## 2022-10-28 ENCOUNTER — Inpatient Hospital Stay (HOSPITAL_BASED_OUTPATIENT_CLINIC_OR_DEPARTMENT_OTHER): Payer: Medicare Other

## 2022-10-28 DIAGNOSIS — G7000 Myasthenia gravis without (acute) exacerbation: Secondary | ICD-10-CM | POA: Diagnosis not present

## 2022-10-28 DIAGNOSIS — Z79620 Long term (current) use of immunosuppressive biologic: Secondary | ICD-10-CM | POA: Insufficient documentation

## 2022-10-28 DIAGNOSIS — Z5111 Encounter for antineoplastic chemotherapy: Secondary | ICD-10-CM | POA: Insufficient documentation

## 2022-10-28 DIAGNOSIS — C50412 Malignant neoplasm of upper-outer quadrant of left female breast: Secondary | ICD-10-CM

## 2022-10-28 DIAGNOSIS — Z170 Estrogen receptor positive status [ER+]: Secondary | ICD-10-CM | POA: Insufficient documentation

## 2022-10-28 DIAGNOSIS — Z8673 Personal history of transient ischemic attack (TIA), and cerebral infarction without residual deficits: Secondary | ICD-10-CM | POA: Insufficient documentation

## 2022-10-28 DIAGNOSIS — D638 Anemia in other chronic diseases classified elsewhere: Secondary | ICD-10-CM | POA: Insufficient documentation

## 2022-10-28 DIAGNOSIS — Z95828 Presence of other vascular implants and grafts: Secondary | ICD-10-CM

## 2022-10-28 DIAGNOSIS — Z7952 Long term (current) use of systemic steroids: Secondary | ICD-10-CM | POA: Diagnosis not present

## 2022-10-28 DIAGNOSIS — R110 Nausea: Secondary | ICD-10-CM | POA: Insufficient documentation

## 2022-10-28 LAB — CBC WITH DIFFERENTIAL (CANCER CENTER ONLY)
Abs Immature Granulocytes: 0.03 10*3/uL (ref 0.00–0.07)
Basophils Absolute: 0 10*3/uL (ref 0.0–0.1)
Basophils Relative: 1 %
Eosinophils Absolute: 0.1 10*3/uL (ref 0.0–0.5)
Eosinophils Relative: 3 %
HCT: 27.7 % — ABNORMAL LOW (ref 36.0–46.0)
Hemoglobin: 9.2 g/dL — ABNORMAL LOW (ref 12.0–15.0)
Immature Granulocytes: 1 %
Lymphocytes Relative: 26 %
Lymphs Abs: 1.3 10*3/uL (ref 0.7–4.0)
MCH: 30.9 pg (ref 26.0–34.0)
MCHC: 33.2 g/dL (ref 30.0–36.0)
MCV: 93 fL (ref 80.0–100.0)
Monocytes Absolute: 0.3 10*3/uL (ref 0.1–1.0)
Monocytes Relative: 7 %
Neutro Abs: 3 10*3/uL (ref 1.7–7.7)
Neutrophils Relative %: 62 %
Platelet Count: 266 10*3/uL (ref 150–400)
RBC: 2.98 MIL/uL — ABNORMAL LOW (ref 3.87–5.11)
RDW: 13.2 % (ref 11.5–15.5)
WBC Count: 4.8 10*3/uL (ref 4.0–10.5)
nRBC: 0 % (ref 0.0–0.2)

## 2022-10-28 LAB — CMP (CANCER CENTER ONLY)
ALT: 16 U/L (ref 0–44)
AST: 19 U/L (ref 15–41)
Albumin: 3.7 g/dL (ref 3.5–5.0)
Alkaline Phosphatase: 122 U/L (ref 38–126)
Anion gap: 8 (ref 5–15)
BUN: 29 mg/dL — ABNORMAL HIGH (ref 8–23)
CO2: 20 mmol/L — ABNORMAL LOW (ref 22–32)
Calcium: 8.8 mg/dL — ABNORMAL LOW (ref 8.9–10.3)
Chloride: 111 mmol/L (ref 98–111)
Creatinine: 1.2 mg/dL — ABNORMAL HIGH (ref 0.44–1.00)
GFR, Estimated: 49 mL/min — ABNORMAL LOW (ref >60)
Glucose, Bld: 277 mg/dL — ABNORMAL HIGH (ref 70–99)
Potassium: 3.8 mmol/L (ref 3.5–5.1)
Sodium: 139 mmol/L (ref 135–145)
Total Bilirubin: 0.4 mg/dL (ref 0.3–1.2)
Total Protein: 6.7 g/dL (ref 6.5–8.1)

## 2022-10-28 MED ORDER — HEPARIN SOD (PORK) LOCK FLUSH 100 UNIT/ML IV SOLN
500.0000 [IU] | Freq: Once | INTRAVENOUS | Status: AC | PRN
  Administered 2022-10-28: 500 [IU]

## 2022-10-28 MED ORDER — SODIUM CHLORIDE 0.9% FLUSH
10.0000 mL | INTRAVENOUS | Status: DC | PRN
  Administered 2022-10-28: 10 mL

## 2022-10-28 MED ORDER — PACLITAXEL PROTEIN-BOUND CHEMO INJECTION 100 MG
80.0000 mg/m2 | Freq: Once | INTRAVENOUS | Status: AC
  Administered 2022-10-28: 150 mg via INTRAVENOUS
  Filled 2022-10-28: qty 30

## 2022-10-28 MED ORDER — PROCHLORPERAZINE MALEATE 10 MG PO TABS
10.0000 mg | ORAL_TABLET | Freq: Once | ORAL | Status: AC
  Administered 2022-10-28: 10 mg via ORAL
  Filled 2022-10-28: qty 1

## 2022-10-28 MED ORDER — SODIUM CHLORIDE 0.9 % IV SOLN: Freq: Once | INTRAVENOUS | Status: AC

## 2022-10-28 MED ORDER — DIPHENOXYLATE-ATROPINE 2.5-0.025 MG PO TABS: 1.0000 | ORAL_TABLET | Freq: Four times a day (QID) | ORAL | 2 refills | Status: DC | PRN

## 2022-10-28 MED ORDER — SODIUM CHLORIDE 0.9% FLUSH
10.0000 mL | Freq: Once | INTRAVENOUS | Status: AC
  Administered 2022-10-28: 10 mL

## 2022-10-28 NOTE — Patient Instructions (Signed)
[facility]  Discharge Instructions: Thank you for choosing [facility] to provide your oncology and hematology care.   If you have a lab appointment with the [facility], please go directly to the [facility] and check in at the registration area.   Wear comfortable clothing and clothing appropriate for easy access to any Portacath or PICC line.   We strive to give you quality time with your provider. You may need to reschedule your appointment if you arrive late (15 or more minutes).  Arriving late affects you and other patients whose appointments are after yours.  Also, if you miss three or more appointments without notifying the office, you may be dismissed from the clinic at the provider's discretion.      For prescription refill requests, have your pharmacy contact our office and allow 72 hours for refills to be completed.    Today you received the following chemotherapy and/or immunotherapy agents: paclitaxel-protein bound      To help prevent nausea and vomiting after your treatment, we encourage you to take your nausea medication as directed.  BELOW ARE SYMPTOMS THAT SHOULD BE REPORTED IMMEDIATELY: *FEVER GREATER THAN 100.4 F (38 C) OR HIGHER *CHILLS OR SWEATING *NAUSEA AND VOMITING THAT IS NOT CONTROLLED WITH YOUR NAUSEA MEDICATION *UNUSUAL SHORTNESS OF BREATH *UNUSUAL BRUISING OR BLEEDING *URINARY PROBLEMS (pain or burning when urinating, or frequent urination) *BOWEL PROBLEMS (unusual diarrhea, constipation, pain near the anus) TENDERNESS IN MOUTH AND THROAT WITH OR WITHOUT PRESENCE OF ULCERS (sore throat, sores in mouth, or a toothache) UNUSUAL RASH, SWELLING OR PAIN  UNUSUAL VAGINAL DISCHARGE OR ITCHING   Items with * indicate a potential emergency and should be followed up as soon as possible or go to the Emergency Department if any problems should occur.  Please show the CHEMOTHERAPY ALERT CARD or IMMUNOTHERAPY  ALERT CARD at check-in to the Emergency Department and triage nurse.  Should you have questions after your visit or need to cancel or reschedule your appointment, please contact [facility]  Dept: [phone]  and follow the prompts.  Office hours are 8:00 a.m. to 4:30 p.m. Monday - Friday. Please note that voicemails left after 4:00 p.m. may not be returned until the following business day.  We are closed weekends and major holidays. You have access to a nurse at all times for urgent questions. Please call the main number to the clinic Dept: [phone] and follow the prompts.   For any non-urgent questions, you may also contact your provider using MyChart. We now offer e-Visits for anyone 27 and older to request care online for non-urgent symptoms. For details visit [email or web address].   Also download the MyChart app! Go to the app store, search "MyChart", open the app, select [facility], and log in with your MyChart username and password.

## 2022-10-30 ENCOUNTER — Telehealth: Payer: Self-pay

## 2022-10-30 ENCOUNTER — Other Ambulatory Visit: Payer: Self-pay | Admitting: Hematology

## 2022-10-30 ENCOUNTER — Telehealth: Payer: Self-pay | Admitting: Hematology

## 2022-10-30 MED ORDER — GABAPENTIN 100 MG PO CAPS: 100.0000 mg | ORAL_CAPSULE | Freq: Every day | ORAL | 0 refills | Status: DC

## 2022-10-30 NOTE — Telephone Encounter (Signed)
Pt called regarding her neuropathy, I called her back. She noticed severe tingling in her hands and feet since last dose (second dose) Abraxane.  She does have diabetes, although denies baseline neuropathy.  I will call in gabapentin 100 mg at night for her, and okay to increase to 200 in 2 weeks if needed.  Benefit and side effect, especially drowsiness, discussed with her in detail, she is willing to try.  If her neuropathy does not improve by next week, I will give her a chemo break next week.  She knows to call us if needed.  I will reduce her Abraxane dose to 60 mg/m from next week, given her neuropathy.  Truitt Merle MD 10/30/2022

## 2022-10-30 NOTE — Telephone Encounter (Signed)
Pt called stating she's experiencing severe neuropathy especially at night.  Pt described the neuropathy as a burning and numbness that happens mostly at night.  Pt stated the pain is keeping her from sleeping at night.  Instructed pt to wrap her leg and feet with warm/hot towels & socks out of the dryer to help with some of the neuropathy she's experiencing.  Pt stated she also has a heated mattress as well.  Pt verbalized understanding and stated she will try the hot towels and socks.  Asked pt had she ever taken Gabapentin or Lyrica before in the past.  Pt stated she's taken Gabapentin but it's been a long time ago.  Pt was unable to state how the medication made her feel whether it made her feel drowsy or not.  Informed pt that this RN will notify Dr. Burr Medico of her call and will have Dr. Burr Medico to give the pt a call when she's finished with clinic.  Pt had no further questions or concerns at this time.  Notified Dr. Burr Medico and Dr. Burr Medico contacted the pt.

## 2022-11-03 DIAGNOSIS — G629 Polyneuropathy, unspecified: Secondary | ICD-10-CM | POA: Insufficient documentation

## 2022-11-03 NOTE — Assessment & Plan Note (Signed)
-  secondary to chemo, her DM may contribute also -she is on low dose gabapentin now, will titrate dose as needed

## 2022-11-03 NOTE — Assessment & Plan Note (Signed)
-  She is on Rituxan every 6 months, CellCept and prednisone.  will continue during chemo  -I previously discussed with her neurologist

## 2022-11-03 NOTE — Assessment & Plan Note (Signed)
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 -I recommend adjuvant chemo with weekly abraxane X12 if she can tolerate  -she developed significant neuropathy after cycke 2 chemo

## 2022-11-04 ENCOUNTER — Inpatient Hospital Stay: Payer: Medicare Other

## 2022-11-04 ENCOUNTER — Ambulatory Visit: Payer: Medicare Other | Admitting: Hematology

## 2022-11-04 ENCOUNTER — Other Ambulatory Visit: Payer: Self-pay

## 2022-11-04 ENCOUNTER — Inpatient Hospital Stay (HOSPITAL_BASED_OUTPATIENT_CLINIC_OR_DEPARTMENT_OTHER): Payer: Medicare Other | Admitting: Hematology

## 2022-11-04 VITALS — BP 139/62 | HR 82 | Temp 98.2°F | Resp 18 | Wt 196.5 lb

## 2022-11-04 DIAGNOSIS — Z7952 Long term (current) use of systemic steroids: Secondary | ICD-10-CM | POA: Diagnosis not present

## 2022-11-04 DIAGNOSIS — C50412 Malignant neoplasm of upper-outer quadrant of left female breast: Secondary | ICD-10-CM

## 2022-11-04 DIAGNOSIS — Z79620 Long term (current) use of immunosuppressive biologic: Secondary | ICD-10-CM | POA: Diagnosis not present

## 2022-11-04 DIAGNOSIS — G620 Drug-induced polyneuropathy: Secondary | ICD-10-CM | POA: Diagnosis not present

## 2022-11-04 DIAGNOSIS — Z170 Estrogen receptor positive status [ER+]: Secondary | ICD-10-CM

## 2022-11-04 DIAGNOSIS — G7000 Myasthenia gravis without (acute) exacerbation: Secondary | ICD-10-CM | POA: Diagnosis not present

## 2022-11-04 DIAGNOSIS — Z8673 Personal history of transient ischemic attack (TIA), and cerebral infarction without residual deficits: Secondary | ICD-10-CM | POA: Diagnosis not present

## 2022-11-04 DIAGNOSIS — D638 Anemia in other chronic diseases classified elsewhere: Secondary | ICD-10-CM | POA: Diagnosis not present

## 2022-11-04 DIAGNOSIS — Z95828 Presence of other vascular implants and grafts: Secondary | ICD-10-CM

## 2022-11-04 DIAGNOSIS — Z5111 Encounter for antineoplastic chemotherapy: Secondary | ICD-10-CM | POA: Diagnosis not present

## 2022-11-04 DIAGNOSIS — R110 Nausea: Secondary | ICD-10-CM | POA: Diagnosis not present

## 2022-11-04 LAB — CMP (CANCER CENTER ONLY)
ALT: 20 U/L (ref 0–44)
AST: 19 U/L (ref 15–41)
Albumin: 3.4 g/dL — ABNORMAL LOW (ref 3.5–5.0)
Alkaline Phosphatase: 118 U/L (ref 38–126)
Anion gap: 6 (ref 5–15)
BUN: 25 mg/dL — ABNORMAL HIGH (ref 8–23)
CO2: 20 mmol/L — ABNORMAL LOW (ref 22–32)
Calcium: 8.9 mg/dL (ref 8.9–10.3)
Chloride: 114 mmol/L — ABNORMAL HIGH (ref 98–111)
Creatinine: 1.27 mg/dL — ABNORMAL HIGH (ref 0.44–1.00)
GFR, Estimated: 46 mL/min — ABNORMAL LOW (ref >60)
Glucose, Bld: 146 mg/dL — ABNORMAL HIGH (ref 70–99)
Potassium: 3.3 mmol/L — ABNORMAL LOW (ref 3.5–5.1)
Sodium: 140 mmol/L (ref 135–145)
Total Bilirubin: 0.3 mg/dL (ref 0.3–1.2)
Total Protein: 8 g/dL (ref 6.5–8.1)

## 2022-11-04 LAB — CBC WITH DIFFERENTIAL (CANCER CENTER ONLY)
Abs Immature Granulocytes: 0.02 10*3/uL (ref 0.00–0.07)
Basophils Absolute: 0 10*3/uL (ref 0.0–0.1)
Basophils Relative: 1 %
Eosinophils Absolute: 0.1 10*3/uL (ref 0.0–0.5)
Eosinophils Relative: 2 %
HCT: 26.3 % — ABNORMAL LOW (ref 36.0–46.0)
Hemoglobin: 8.8 g/dL — ABNORMAL LOW (ref 12.0–15.0)
Immature Granulocytes: 0 %
Lymphocytes Relative: 28 %
Lymphs Abs: 1.3 10*3/uL (ref 0.7–4.0)
MCH: 31.2 pg (ref 26.0–34.0)
MCHC: 33.5 g/dL (ref 30.0–36.0)
MCV: 93.3 fL (ref 80.0–100.0)
Monocytes Absolute: 0.4 10*3/uL (ref 0.1–1.0)
Monocytes Relative: 9 %
Neutro Abs: 2.8 10*3/uL (ref 1.7–7.7)
Neutrophils Relative %: 60 %
Platelet Count: 262 10*3/uL (ref 150–400)
RBC: 2.82 MIL/uL — ABNORMAL LOW (ref 3.87–5.11)
RDW: 13.3 % (ref 11.5–15.5)
WBC Count: 4.6 10*3/uL (ref 4.0–10.5)
nRBC: 0 % (ref 0.0–0.2)

## 2022-11-04 MED ORDER — SODIUM CHLORIDE 0.9 % IV SOLN: Freq: Once | INTRAVENOUS | Status: AC

## 2022-11-04 MED ORDER — SODIUM CHLORIDE 0.9% FLUSH
10.0000 mL | INTRAVENOUS | Status: DC | PRN
  Administered 2022-11-04: 10 mL

## 2022-11-04 MED ORDER — PACLITAXEL PROTEIN-BOUND CHEMO INJECTION 100 MG
60.0000 mg/m2 | Freq: Once | INTRAVENOUS | Status: AC
  Administered 2022-11-04: 125 mg via INTRAVENOUS
  Filled 2022-11-04: qty 25

## 2022-11-04 MED ORDER — SODIUM CHLORIDE 0.9% FLUSH
10.0000 mL | Freq: Once | INTRAVENOUS | Status: AC
  Administered 2022-11-04: 10 mL

## 2022-11-04 MED ORDER — HEPARIN SOD (PORK) LOCK FLUSH 100 UNIT/ML IV SOLN
500.0000 [IU] | Freq: Once | INTRAVENOUS | Status: AC | PRN
  Administered 2022-11-04: 500 [IU]

## 2022-11-04 MED ORDER — PROCHLORPERAZINE MALEATE 10 MG PO TABS
10.0000 mg | ORAL_TABLET | Freq: Once | ORAL | Status: AC
  Administered 2022-11-04: 10 mg via ORAL
  Filled 2022-11-04: qty 1

## 2022-11-04 NOTE — Progress Notes (Signed)
[facility]   Telephone:([phone]) [phone] Fax:([phone]   Clinic Follow up Note   Patient Care Team: [name], DO as PCP - General (Family Medicine) Garvin Fila, MD as Consulting Physician (Neurology) Frann Rider, NP as Nurse Practitioner (Neurology) Rockwell Germany, RN as Oncology Nurse Navigator Mauro Kaufmann, RN as Oncology Nurse Navigator Coralie Keens, MD as Consulting Physician (General Surgery) Truitt Merle, MD as Consulting Physician (Hematology) Kyung Rudd, MD as Consulting Physician (Radiation Oncology) Ian Malkin, MD as Consulting Physician (Rheumatology)  Date of Service:  11/04/2022  CHIEF COMPLAINT: f/u of left breast cancer    CURRENT THERAPY: Adjuvant chemotherapy with Abraxane '80mg'$ /m2 weekly for 12 weeks, starting 10/21/22    ASSESSMENT:  [name] is a 72 y.o. 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  I will reduce her Abraxane dose to 60 mg/m and continue  Myasthenia gravis ([place]) -She is on Rituxan every 6 months, CellCept and prednisone.  will continue during chemo  -I previously discussed with her neurologist   Peripheral neuropathy, G1 -secondary to chemo, her DM may contribute also -I called in gabapentin, she has not started yet.  She will continue cryotherapy during chemo, and use warm gloves when the blanket at night.     PLAN: -lab reviewed -proceed with C3 Abraxane at reduce  dose due to neuropathy and to see if the pt can tolerate -En courage there pt to exercise to help prevent neuropathy. -lab, f/u and Abraxane 11/25/2022    SUMMARY OF ONCOLOGIC HISTORY: Oncology History Overview Note   Cancer Staging  Malignant neoplasm of upper-outer quadrant of left breast in female, estrogen receptor positive ([place]) Staging form: Breast, AJCC 8th Edition - Clinical: Stage IB (cT1b, cN0, cM0, G3, ER+, PR-, HER2-) - Signed by Hayden Pedro, PA-C on 08/10/2022 Stage prefix: Initial diagnosis Method of lymph node assessment: Clinical Histologic grading system: 3 grade system - Pathologic stage from 09/02/2022: Stage IB (pT1b, pN0, cM0, G2, ER-, PR-, HER2-) - Signed by Truitt Merle, MD on 09/23/2022 Stage prefix: Initial diagnosis Histologic grading system: 3 grade system Residual tumor (R): R0 - None     Malignant neoplasm of upper-outer quadrant of left breast in female, estrogen receptor positive ([place])  08/10/2022 Initial Diagnosis   Malignant neoplasm of upper-outer quadrant of left female breast ([place])   08/10/2022 Cancer Staging   Staging form: Breast, AJCC 8th Edition - Clinical: Stage IB (cT1b, cN0, cM0, G3, ER+, PR-, HER2-) - Signed by Hayden Pedro, PA-C on 08/10/2022 Stage prefix: Initial diagnosis Method of lymph node assessment: Clinical Histologic grading system: 3 grade system   09/02/2022 Cancer Staging   Staging form: Breast, AJCC 8th Edition - Pathologic stage from 09/02/2022: Stage IB (pT1b, pN0, cM0, G2, ER-, PR-, HER2-) - Signed by Truitt Merle, MD on 09/23/2022 Stage prefix: Initial diagnosis Histologic grading system: 3 grade system Residual tumor (R): R0 - None   10/21/2022 -  Chemotherapy   Patient is on Treatment Plan : BREAST PACLitaxel-Albumin (  Abraxane) (100) D1,8,15 q28d        INTERVAL HISTORY:  [name] is here for a follow up of  left breast cancer  She was last seen by NP Lacie on 10/28/2022 She presents to the  clinic with family. Pt state she has neuropathy in her hands.Pt state she has some diarrhea    All other systems were reviewed with the patient and are negative.  MEDICAL HISTORY:  Past Medical History:  Diagnosis Date   Arthritis    "all over my body" (03/13/2013)   Asthma    Asymptomatic carotid artery stenosis, bilateral 10/08/2018   Breast cancer (HCC)    GERD (gastroesophageal reflux disease)    H/O hiatal hernia    Headache    pt states she has had headaches for about 6 months "off and on"   Heart murmur    pt had echocardiogram on 09/17/21   Hypercholesterolemia 10/08/2018   Hypertension    Hypothyroidism    Myasthenia gravis ([place])    "in my eyes; diagnsosed > 7 yr ago" (03/13/2013)   Sleep apnea    on CPAP   Stroke ([place]) 2021   Thyroid carcinoma ([place])    Type II diabetes mellitus ([place])     SURGICAL HISTORY: Past Surgical History:  Procedure Laterality Date   ABDOMINAL HYSTERECTOMY     ANTERIOR CERVICAL DECOMP/DISCECTOMY FUSION     "I've had severa ORs; always went in from the front" (03/13/2013)   APPENDECTOMY     BREAST LUMPECTOMY WITH RADIOACTIVE SEED AND SENTINEL LYMPH NODE BIOPSY Left 09/02/2022   Procedure: LEFT BREAST LUMPECTOMY WITH RADIOACTIVE SEED AND SENTINEL LYMPH NODE BIOPSY;  Surgeon: Coralie Keens, MD;  Location: [facility];  Service: General;  Laterality: Left;   CARDIAC CATHETERIZATION     "several" (03/13/2013)   CARPAL TUNNEL RELEASE Right    CATARACT EXTRACTION W/ INTRAOCULAR LENS  IMPLANT, BILATERAL Bilateral    CHOLECYSTECTOMY     KNEE ARTHROSCOPY Left    SHOULDER ARTHROSCOPY W/ ROTATOR CUFF REPAIR Left    TONSILLECTOMY     TOTAL THYROIDECTOMY     TRANSCAROTID ARTERY REVASCULARIZATION  Left 09/24/2021   Procedure: LEFT TRANSCAROTID ARTERY REVASCULARIZATION;  Surgeon: Cherre Robins, MD;  Location: [facility] OR;  Service: Vascular;  Laterality: Left;   TRANSCAROTID ARTERY REVASCULARIZATION  Right 10/23/2021   Procedure: Right Transcarotid Artery  Revascularization;  Surgeon: Cherre Robins, MD;  Location: [facility];  Service: Vascular;  Laterality: Right;   ULTRASOUND GUIDANCE FOR VASCULAR ACCESS Right 09/24/2021   Procedure: ULTRASOUND GUIDANCE FOR VASCULAR ACCESS;  Surgeon: Cherre Robins, MD;  Location: [facility] OR;  Service: Vascular;  Laterality: Right;   ULTRASOUND GUIDANCE FOR VASCULAR ACCESS Right 10/23/2021   Procedure: ULTRASOUND GUIDANCE FOR VASCULAR ACCESS, LEFT FEMORAL VEIN;  Surgeon: Cherre Robins, MD;  Location: [facility] OR;  Service: Vascular;  Laterality: Right;    I have reviewed the social history and family history with the patient and they are unchanged from previous note.  ALLERGIES:  is allergic to contrast media [iodinated contrast media], fluorescein, iodine, molds & smuts, shellfish-derived products, pholcodine, azithromycin, codeine, metformin hcl er, oxycodone, tramadol hcl, and other.  MEDICATIONS:  Current Outpatient Medications  Medication Sig Dispense Refill   acetaminophen (TYLENOL) 500 MG tablet Take 500 mg by mouth every 6 (six) hours as needed for moderate pain.     aspirin EC 81 MG EC tablet Take 1 tablet (81 mg total) by mouth daily.     Biotin  5000 MCG CAPS Take 5,000 mcg by mouth daily with breakfast.      cycloSPORINE (RESTASIS) 0.05 % ophthalmic emulsion Place 1 drop into both eyes 2 (two) times daily. 0.4 mL 0   diclofenac Sodium (VOLTAREN) 1 % GEL Apply 2 g topically 4 (four) times daily. (Patient taking differently: Apply 1 application  topically daily as needed (pain).) 2 g 0   diphenhydrAMINE 25 mg in sodium chloride 0.9 % 50 mL Inject 25 mg into the vein See admin instructions. Administer 25 mg intravenously at start of Gamunex infusion, then administer 25 mg during infusion     diphenoxylate-atropine (LOMOTIL) 2.5-0.025 MG tablet Take 1-2 tablets by mouth 4 (four) times daily as needed for diarrhea or loose stools. 30 tablet 2   ELDERBERRY PO Take 1 tablet by mouth daily.     empagliflozin (JARDIANCE)  25 MG TABS tablet Take 25 mg by mouth daily.     esomeprazole (NEXIUM) 40 MG capsule Take 1 capsule (40 mg total) by mouth 2 (two) times daily. 60 capsule 0   Evolocumab with Infusor ([place]) 420 MG/3.5ML SOCT Inject 420 mg into the skin every 30 (thirty) days.     Exenatide ER (BYDUREON BCISE) 2 MG/0.85ML AUIJ Inject 2 mg into the skin every Monday.     ezetimibe (ZETIA) 10 MG tablet TAKE 1 TABLET BY MOUTH EVERY DAY 90 tablet 1   furosemide (LASIX) 40 MG tablet Take 40 mg by mouth daily as needed for edema.     gabapentin (NEURONTIN) 100 MG capsule Take 1 capsule (100 mg total) by mouth at bedtime. Increase to 2 cap at night in 2 weeks if needed 30 capsule 0   Immune Globulin, Human, 40 GM/400ML SOLN Inject into the vein every 6 (six) weeks. Infusions are administered at home by home health nurse - last infusion 09/08/21 and 09/09/21; next infusion due 10/19/21 and 10/20/21     insulin degludec (TRESIBA FLEXTOUCH) 100 UNIT/ML FlexTouch Pen Inject 15 Units into the skin in the morning.     Insulin Disposable Pump (OMNIPOD DASH 5 PACK PODS) MISC Use with Humalog     insulin lispro (HUMALOG) 100 UNIT/ML injection Inject into the skin See admin instructions. Inject subcutaneously 3 (three) times daily with meals With meals through the Omnipod Dash Pump every 72 hours as directed     levothyroxine (SYNTHROID) 175 MCG tablet Take 175 mcg by mouth daily before breakfast.     lidocaine-prilocaine (EMLA) cream Apply to affected area once 30 g 3   loratadine (CLARITIN) 10 MG tablet Take 1 tablet (10 mg total) by mouth at bedtime. 30 tablet 0   meclizine (ANTIVERT) 25 MG tablet Take 25 mg by mouth 3 (three) times daily as needed for dizziness or nausea (or migraine-related nausea).     montelukast (SINGULAIR) 10 MG tablet Take 1 tablet (10 mg total) by mouth every morning. 30 tablet 0   mycophenolate (CELLCEPT) 500 MG tablet Take 1,000 mg by mouth 2 (two) times daily. For myasthenia gravis      olmesartan (BENICAR) 20 MG tablet Take 1 tablet (20 mg total) by mouth daily. 30 tablet 0   ondansetron (ZOFRAN) 8 MG tablet Take 1 tablet (8 mg total) by mouth every 8 (eight) hours as needed for nausea or vomiting. 30 tablet 1   predniSONE (DELTASONE) 2.5 MG tablet Take 7.5 mg by mouth every morning. For myasthenia gravis     PRESCRIPTION MEDICATION Pt uses a cpap nightly  PROAIR HFA 108 (90 BASE) MCG/ACT inhaler Inhale 2 puffs into the lungs every 6 (six) hours as needed for wheezing or shortness of breath.      prochlorperazine (COMPAZINE) 10 MG tablet Take 1 tablet (10 mg total) by mouth every 6 (six) hours as needed for nausea or vomiting. 30 tablet 1   riTUXimab (RITUXAN) 500 MG/50ML injection Inject into the vein every 6 (six) months.     rosuvastatin (CRESTOR) 40 MG tablet Take 1 tablet (40 mg total) by mouth daily. 90 tablet 3   topiramate (TOPAMAX) 50 MG tablet Take 1 tablet (50 mg total) by mouth at bedtime. 90 tablet 3   traMADol (ULTRAM) 50 MG tablet Take 1 tablet (50 mg total) by mouth every 6 (six) hours as needed for moderate pain. 25 tablet 0   vitamin B-12 (CYANOCOBALAMIN) 1000 MCG tablet Take 1,000 mcg by mouth daily.     Vitamin D, Ergocalciferol, (DRISDOL) 50000 UNITS CAPS capsule Take 50,000 Units by mouth every Monday.      No current facility-administered medications for this visit.   Facility-Administered Medications Ordered in Other Visits  Medication Dose Route Frequency Provider Last Rate Last Admin   sodium chloride flush (NS) 0.9 % injection 10 mL  10 mL Intracatheter PRN Truitt Merle, MD   10 mL at 11/04/22 1149    PHYSICAL EXAMINATION: ECOG PERFORMANCE STATUS: 2 - Symptomatic, <50% confined to bed  There were no vitals filed for this visit. Wt Readings from Last 3 Encounters:  11/04/22 196 lb 8 oz (89.1 kg)  10/28/22 196 lb 14.4 oz (89.3 kg)  10/21/22 198 lb 1.6 oz (89.9 kg)     GENERAL:alert, no distress and comfortable SKIN: skin color normal, no  rashes or significant lesions EYES: normal, Conjunctiva are pink and non-injected, sclera clear  NEURO: alert & oriented x 3 with fluent speech   LABORATORY DATA:  I have reviewed the data as listed    Latest Ref Rng & Units 11/04/2022    9:03 AM 10/28/2022   11:14 AM 10/21/2022    9:51 AM  CBC  WBC 4.0 - 10.5 K/uL 4.6  4.8  6.4   Hemoglobin 12.0 - 15.0 g/dL 8.8  9.2  9.8   Hematocrit 36.0 - 46.0 % 26.3  27.7  29.6   Platelets 150 - 400 K/uL 262  266  237         Latest Ref Rng & Units 11/04/2022    9:03 AM 10/28/2022   11:14 AM 10/21/2022    9:51 AM  CMP  Glucose 70 - 99 mg/dL 146  277  186   BUN 8 - 23 mg/dL '25  29  22   '$ Creatinine 0.44 - 1.00 mg/dL 1.27  1.20  1.01   Sodium 135 - 145 mmol/L 140  139  141   Potassium 3.5 - 5.1 mmol/L 3.3  3.8  3.7   Chloride 98 - 111 mmol/L 114  111  113   CO2 22 - 32 mmol/L '20  20  21   '$ Calcium 8.9 - 10.3 mg/dL 8.9  8.8  8.8   Total Protein 6.5 - 8.1 g/dL 8.0  6.7  6.4   Total Bilirubin 0.3 - 1.2 mg/dL 0.3  0.4  0.4   Alkaline Phos 38 - 126 U/L 118  122  128   AST 15 - 41 U/L '19  19  16   '$ ALT 0 - 44 U/L 20  16  18  RADIOGRAPHIC STUDIES: I have personally reviewed the radiological images as listed and agreed with the findings in the report. No results found.    No orders of the defined types were placed in this encounter.  All questions were answered. The patient knows to call the clinic with any problems, questions or concerns. No barriers to learning was detected. The total time spent in the appointment was 30 minutes.     Truitt Merle, MD 11/04/2022   Felicity Coyer, CMA, am acting as scribe for Truitt Merle, MD.   I have reviewed the above documentation for accuracy and completeness, and I agree with the above.

## 2022-11-04 NOTE — Patient Instructions (Signed)
[facility]  Discharge Instructions: Thank you for choosing [facility] to provide your oncology and hematology care.   If you have a lab appointment with the Cancer Center, please go directly to the Cancer Center and check in at the registration area.   Wear comfortable clothing and clothing appropriate for easy access to any Portacath or PICC line.   We strive to give you quality time with your provider. You may need to reschedule your appointment if you arrive late (15 or more minutes).  Arriving late affects you and other patients whose appointments are after yours.  Also, if you miss three or more appointments without notifying the office, you may be dismissed from the clinic at the provider's discretion.      For prescription refill requests, have your pharmacy contact our office and allow 72 hours for refills to be completed.    Today you received the following chemotherapy and/or immunotherapy agents: paclitaxel-protein bound      To help prevent nausea and vomiting after your treatment, we encourage you to take your nausea medication as directed.  BELOW ARE SYMPTOMS THAT SHOULD BE REPORTED IMMEDIATELY: *FEVER GREATER THAN 100.4 F (38 C) OR HIGHER *CHILLS OR SWEATING *NAUSEA AND VOMITING THAT IS NOT CONTROLLED WITH YOUR NAUSEA MEDICATION *UNUSUAL SHORTNESS OF BREATH *UNUSUAL BRUISING OR BLEEDING *URINARY PROBLEMS (pain or burning when urinating, or frequent urination) *BOWEL PROBLEMS (unusual diarrhea, constipation, pain near the anus) TENDERNESS IN MOUTH AND THROAT WITH OR WITHOUT PRESENCE OF ULCERS (sore throat, sores in mouth, or a toothache) UNUSUAL RASH, SWELLING OR PAIN  UNUSUAL VAGINAL DISCHARGE OR ITCHING   Items with * indicate a potential emergency and should be followed up as soon as possible or go to the Emergency Department if any problems should occur.  Please show the CHEMOTHERAPY ALERT CARD or IMMUNOTHERAPY  ALERT CARD at check-in to the Emergency Department and triage nurse.  Should you have questions after your visit or need to cancel or reschedule your appointment, please contact [facility]  Dept: [phone]  and follow the prompts.  Office hours are 8:00 a.m. to 4:30 p.m. Monday - Friday. Please note that voicemails left after 4:00 p.m. may not be returned until the following business day.  We are closed weekends and major holidays. You have access to a nurse at all times for urgent questions. Please call the main number to the clinic Dept: [phone] and follow the prompts.   For any non-urgent questions, you may also contact your provider using MyChart. We now offer e-Visits for anyone 18 and older to request care online for non-urgent symptoms. For details visit [email or web address].   Also download the MyChart app! Go to the app store, search "MyChart", open the app, select , and log in with your MyChart username and password.   

## 2022-11-05 ENCOUNTER — Telehealth: Payer: Self-pay

## 2022-11-05 NOTE — Telephone Encounter (Signed)
LVM stating this RN was calling to check on pt to see how she's feeling after receiving IVIG on Monday & Tuesday plus chemotherapy on Wednesday.  Requested pt to give Dr. Ernestina Penna office a call if she's experiencing chemo related symptoms.

## 2022-11-09 ENCOUNTER — Telehealth: Payer: Self-pay

## 2022-11-09 ENCOUNTER — Other Ambulatory Visit: Payer: Self-pay | Admitting: Hematology

## 2022-11-09 DIAGNOSIS — C50412 Malignant neoplasm of upper-outer quadrant of left female breast: Secondary | ICD-10-CM

## 2022-11-09 NOTE — Telephone Encounter (Addendum)
Called patient and relayed message below as per Dr. Burr Medico. Patient voiced full understanding.  ----- Message from Truitt Merle, MD sent at 11/09/2022  9:08 AM EDT ----- Please let pt know her Cr has been slightly elevated, encourage her to drink more fluids, I added 528ml IVF in her chemo plan, thanks  Truitt Merle

## 2022-11-17 ENCOUNTER — Inpatient Hospital Stay: Payer: Medicare Other

## 2022-11-17 ENCOUNTER — Inpatient Hospital Stay (HOSPITAL_BASED_OUTPATIENT_CLINIC_OR_DEPARTMENT_OTHER): Payer: Medicare Other | Admitting: Physician Assistant

## 2022-11-17 ENCOUNTER — Other Ambulatory Visit: Payer: Self-pay

## 2022-11-17 VITALS — BP 160/74 | HR 101 | Resp 16

## 2022-11-17 DIAGNOSIS — Z170 Estrogen receptor positive status [ER+]: Secondary | ICD-10-CM | POA: Diagnosis not present

## 2022-11-17 DIAGNOSIS — R110 Nausea: Secondary | ICD-10-CM | POA: Diagnosis not present

## 2022-11-17 DIAGNOSIS — Z7952 Long term (current) use of systemic steroids: Secondary | ICD-10-CM | POA: Diagnosis not present

## 2022-11-17 DIAGNOSIS — Z95828 Presence of other vascular implants and grafts: Secondary | ICD-10-CM

## 2022-11-17 DIAGNOSIS — Z5111 Encounter for antineoplastic chemotherapy: Secondary | ICD-10-CM | POA: Diagnosis not present

## 2022-11-17 DIAGNOSIS — D638 Anemia in other chronic diseases classified elsewhere: Secondary | ICD-10-CM | POA: Diagnosis not present

## 2022-11-17 DIAGNOSIS — C50412 Malignant neoplasm of upper-outer quadrant of left female breast: Secondary | ICD-10-CM

## 2022-11-17 DIAGNOSIS — Z79620 Long term (current) use of immunosuppressive biologic: Secondary | ICD-10-CM | POA: Diagnosis not present

## 2022-11-17 DIAGNOSIS — G7000 Myasthenia gravis without (acute) exacerbation: Secondary | ICD-10-CM | POA: Diagnosis not present

## 2022-11-17 DIAGNOSIS — Z8673 Personal history of transient ischemic attack (TIA), and cerebral infarction without residual deficits: Secondary | ICD-10-CM | POA: Diagnosis not present

## 2022-11-17 LAB — CBC WITH DIFFERENTIAL (CANCER CENTER ONLY)
Abs Immature Granulocytes: 0.02 10*3/uL (ref 0.00–0.07)
Basophils Absolute: 0 10*3/uL (ref 0.0–0.1)
Basophils Relative: 1 %
Eosinophils Absolute: 0.1 10*3/uL (ref 0.0–0.5)
Eosinophils Relative: 2 %
HCT: 28.7 % — ABNORMAL LOW (ref 36.0–46.0)
Hemoglobin: 9.6 g/dL — ABNORMAL LOW (ref 12.0–15.0)
Immature Granulocytes: 0 %
Lymphocytes Relative: 28 %
Lymphs Abs: 1.3 10*3/uL (ref 0.7–4.0)
MCH: 31 pg (ref 26.0–34.0)
MCHC: 33.4 g/dL (ref 30.0–36.0)
MCV: 92.6 fL (ref 80.0–100.0)
Monocytes Absolute: 0.6 10*3/uL (ref 0.1–1.0)
Monocytes Relative: 13 %
Neutro Abs: 2.7 10*3/uL (ref 1.7–7.7)
Neutrophils Relative %: 56 %
Platelet Count: 239 10*3/uL (ref 150–400)
RBC: 3.1 MIL/uL — ABNORMAL LOW (ref 3.87–5.11)
RDW: 13.5 % (ref 11.5–15.5)
WBC Count: 4.8 10*3/uL (ref 4.0–10.5)
nRBC: 0 % (ref 0.0–0.2)

## 2022-11-17 LAB — CMP (CANCER CENTER ONLY)
ALT: 30 U/L (ref 0–44)
AST: 28 U/L (ref 15–41)
Albumin: 3.8 g/dL (ref 3.5–5.0)
Alkaline Phosphatase: 144 U/L — ABNORMAL HIGH (ref 38–126)
Anion gap: 7 (ref 5–15)
BUN: 21 mg/dL (ref 8–23)
CO2: 21 mmol/L — ABNORMAL LOW (ref 22–32)
Calcium: 9.3 mg/dL (ref 8.9–10.3)
Chloride: 112 mmol/L — ABNORMAL HIGH (ref 98–111)
Creatinine: 0.97 mg/dL (ref 0.44–1.00)
GFR, Estimated: >60 mL/min (ref >60)
Glucose, Bld: 180 mg/dL — ABNORMAL HIGH (ref 70–99)
Potassium: 3.5 mmol/L (ref 3.5–5.1)
Sodium: 140 mmol/L (ref 135–145)
Total Bilirubin: 0.3 mg/dL (ref 0.3–1.2)
Total Protein: 7.3 g/dL (ref 6.5–8.1)

## 2022-11-17 MED ORDER — PACLITAXEL PROTEIN-BOUND CHEMO INJECTION 100 MG
60.0000 mg/m2 | Freq: Once | INTRAVENOUS | Status: AC
  Administered 2022-11-17: 125 mg via INTRAVENOUS
  Filled 2022-11-17: qty 25

## 2022-11-17 MED ORDER — PROCHLORPERAZINE MALEATE 10 MG PO TABS
10.0000 mg | ORAL_TABLET | Freq: Once | ORAL | Status: AC
  Administered 2022-11-17: 10 mg via ORAL
  Filled 2022-11-17: qty 1

## 2022-11-17 MED ORDER — HEPARIN SOD (PORK) LOCK FLUSH 100 UNIT/ML IV SOLN
500.0000 [IU] | Freq: Once | INTRAVENOUS | Status: AC | PRN
  Administered 2022-11-17: 500 [IU]

## 2022-11-17 MED ORDER — SODIUM CHLORIDE 0.9% FLUSH
10.0000 mL | Freq: Once | INTRAVENOUS | Status: AC
  Administered 2022-11-17: 10 mL

## 2022-11-17 MED ORDER — SODIUM CHLORIDE 0.9 % IV SOLN: Freq: Once | INTRAVENOUS | Status: AC

## 2022-11-17 MED ORDER — SODIUM CHLORIDE 0.9% FLUSH
10.0000 mL | INTRAVENOUS | Status: DC | PRN
  Administered 2022-11-17: 10 mL

## 2022-11-17 NOTE — Patient Instructions (Signed)
[facility]  Discharge Instructions: Thank you for choosing [facility] to provide your oncology and hematology care.   If you have a lab appointment with the [facility], please go directly to the [facility] and check in at the registration area.   Wear comfortable clothing and clothing appropriate for easy access to any Portacath or PICC line.   We strive to give you quality time with your provider. You may need to reschedule your appointment if you arrive late (15 or more minutes).  Arriving late affects you and other patients whose appointments are after yours.  Also, if you miss three or more appointments without notifying the office, you may be dismissed from the clinic at the provider's discretion.      For prescription refill requests, have your pharmacy contact our office and allow 72 hours for refills to be completed.    Today you received the following chemotherapy and/or immunotherapy agents: Abraxane (paclitaxel-protein bound)    To help prevent nausea and vomiting after your treatment, we encourage you to take your nausea medication as directed.  BELOW ARE SYMPTOMS THAT SHOULD BE REPORTED IMMEDIATELY: *FEVER GREATER THAN 100.4 F (38 C) OR HIGHER *CHILLS OR SWEATING *NAUSEA AND VOMITING THAT IS NOT CONTROLLED WITH YOUR NAUSEA MEDICATION *UNUSUAL SHORTNESS OF BREATH *UNUSUAL BRUISING OR BLEEDING *URINARY PROBLEMS (pain or burning when urinating, or frequent urination) *BOWEL PROBLEMS (unusual diarrhea, constipation, pain near the anus) TENDERNESS IN MOUTH AND THROAT WITH OR WITHOUT PRESENCE OF ULCERS (sore throat, sores in mouth, or a toothache) UNUSUAL RASH, SWELLING OR PAIN  UNUSUAL VAGINAL DISCHARGE OR ITCHING   Items with * indicate a potential emergency and should be followed up as soon as possible or go to the Emergency Department if any problems should occur.  Please show the CHEMOTHERAPY ALERT CARD or  IMMUNOTHERAPY ALERT CARD at check-in to the Emergency Department and triage nurse.  Should you have questions after your visit or need to cancel or reschedule your appointment, please contact [facility]  Dept: [phone]  and follow the prompts.  Office hours are 8:00 a.m. to 4:30 p.m. Monday - Friday. Please note that voicemails left after 4:00 p.m. may not be returned until the following business day.  We are closed weekends and major holidays. You have access to a nurse at all times for urgent questions. Please call the main number to the clinic Dept: [phone] and follow the prompts.   For any non-urgent questions, you may also contact your provider using MyChart. We now offer e-Visits for anyone 58 and older to request care online for non-urgent symptoms. For details visit [email or web address].   Also download the MyChart app! Go to the app store, search "MyChart", open the app, select [facility], and log in with your MyChart username and password.

## 2022-11-17 NOTE — Progress Notes (Signed)
[facility]   Telephone:(336) [phone] Fax:(336) [phone]   Clinic Follow up Note   Patient Care Team: [name], DO as PCP - General (Family Medicine) Garvin Fila, MD as Consulting Physician (Neurology) Frann Rider, NP as Nurse Practitioner (Neurology) Rockwell Germany, RN as Oncology Nurse Navigator Mauro Kaufmann, RN as Oncology Nurse Navigator Coralie Keens, MD as Consulting Physician (General Surgery) Truitt Merle, MD as Consulting Physician (Hematology) Kyung Rudd, MD as Consulting Physician (Radiation Oncology) Ian Malkin, MD as Consulting Physician (Rheumatology)  Date of Service:  11/17/2022  CHIEF COMPLAINT: f/u of left breast cancer    CURRENT THERAPY: Adjuvant chemotherapy with Abraxane weekly for 12 weeks, starting 10/21/22   SUMMARY OF ONCOLOGIC HISTORY: Oncology History Overview Note   Cancer Staging  Malignant neoplasm of upper-outer quadrant of left breast in female, estrogen receptor positive ([place]) Staging form: Breast, AJCC 8th Edition - Clinical: Stage IB (cT1b, cN0, cM0, G3, ER+, PR-, HER2-) - Signed by Hayden Pedro, PA-C on 08/10/2022 Stage prefix: Initial diagnosis Method of lymph node assessment: Clinical Histologic grading system: 3 grade system - Pathologic stage from 09/02/2022: Stage IB (pT1b, pN0, cM0, G2, ER-, PR-, HER2-) - Signed by Truitt Merle, MD on 09/23/2022 Stage prefix: Initial diagnosis Histologic grading system: 3 grade system Residual tumor (R): R0 - None     Malignant neoplasm of upper-outer quadrant of left breast in female, estrogen receptor positive ([place] [facility])  08/10/2022 Initial Diagnosis   Malignant neoplasm of upper-outer quadrant of left female breast ([place])   08/10/2022 Cancer Staging   Staging form: Breast, AJCC 8th Edition - Clinical: Stage IB (cT1b, cN0, cM0, G3, ER+, PR-, HER2-) - Signed by Hayden Pedro, PA-C on 08/10/2022 Stage prefix: Initial diagnosis Method of lymph node  assessment: Clinical Histologic grading system: 3 grade system   09/02/2022 Cancer Staging   Staging form: Breast, AJCC 8th Edition - Pathologic stage from 09/02/2022: Stage IB (pT1b, pN0, cM0, G2, ER-, PR-, HER2-) - Signed by Truitt Merle, MD on 09/23/2022 Stage prefix: Initial diagnosis Histologic grading system: 3 grade system Residual tumor (R): R0 - None   10/21/2022 -  Chemotherapy   Patient is on Treatment Plan : BREAST PACLitaxel-Albumin (Abraxane) (100) D1,8,15 q28d        INTERVAL HISTORY:  XYLIE DINER is here for a follow up of  left breast cancer  She was last seen by Dr. Burr Medico on 11/04/2022 She presents to the clinic with family member.  Ms. Agopian reports she is tolerating treatment without any new symptoms. She does have diarrhea for 1-2 days after each treatment. She started taking imodium with improvement of symptoms. She has yet to take Lomotil that was prescribed. She reports her energy and appetite are stable. She denies nausea, vomiting or abdominal pain. She reports her neuropathy is stable involving her finger tips and feet. She denies any interference with grip or balance. She has yet to start gabapentin. She denies fevers, chills, sweats, shortness of breath, chest pain or cough. She has no other complaints.    All other systems were reviewed with the patient and are negative.  MEDICAL HISTORY:  Past Medical History:  Diagnosis Date   Arthritis    "all over my body" (03/13/2013)   Asthma    Asymptomatic carotid artery stenosis, bilateral 10/08/2018   Breast cancer ([place])    GERD (gastroesophageal reflux disease)    H/O hiatal hernia    Headache    pt states she has had  headaches for about 6 months "off and on"   Heart murmur    pt had echocardiogram on 09/17/21   Hypercholesterolemia 10/08/2018   Hypertension    Hypothyroidism    Myasthenia gravis ([place])    "in my eyes; diagnsosed > 7 yr ago" (03/13/2013)   Sleep apnea    on CPAP   Stroke ([place]) 2021    Thyroid carcinoma ([place])    Type II diabetes mellitus ([place])     SURGICAL HISTORY: Past Surgical History:  Procedure Laterality Date   ABDOMINAL HYSTERECTOMY     ANTERIOR CERVICAL DECOMP/DISCECTOMY FUSION     "I've had severa ORs; always went in from the front" (03/13/2013)   APPENDECTOMY     BREAST LUMPECTOMY WITH RADIOACTIVE SEED AND SENTINEL LYMPH NODE BIOPSY Left 09/02/2022   Procedure: LEFT BREAST LUMPECTOMY WITH RADIOACTIVE SEED AND SENTINEL LYMPH NODE BIOPSY;  Surgeon: Coralie Keens, MD;  Location: [facility];  Service: General;  Laterality: Left;   CARDIAC CATHETERIZATION     "several" (03/13/2013)   CARPAL TUNNEL RELEASE Right    CATARACT EXTRACTION W/ INTRAOCULAR LENS  IMPLANT, BILATERAL Bilateral    CHOLECYSTECTOMY     KNEE ARTHROSCOPY Left    SHOULDER ARTHROSCOPY W/ ROTATOR CUFF REPAIR Left    TONSILLECTOMY     TOTAL THYROIDECTOMY     TRANSCAROTID ARTERY REVASCULARIZATION  Left 09/24/2021   Procedure: LEFT TRANSCAROTID ARTERY REVASCULARIZATION;  Surgeon: Cherre Robins, MD;  Location: [facility];  Service: Vascular;  Laterality: Left;   TRANSCAROTID ARTERY REVASCULARIZATION  Right 10/23/2021   Procedure: Right Transcarotid Artery Revascularization;  Surgeon: Cherre Robins, MD;  Location: [facility];  Service: Vascular;  Laterality: Right;   ULTRASOUND GUIDANCE FOR VASCULAR ACCESS Right 09/24/2021   Procedure: ULTRASOUND GUIDANCE FOR VASCULAR ACCESS;  Surgeon: Cherre Robins, MD;  Location: [facility];  Service: Vascular;  Laterality: Right;   ULTRASOUND GUIDANCE FOR VASCULAR ACCESS Right 10/23/2021   Procedure: ULTRASOUND GUIDANCE FOR VASCULAR ACCESS, LEFT FEMORAL VEIN;  Surgeon: Cherre Robins, MD;  [facility];  Service: Vascular;  Laterality: Right;    I have reviewed the social history and family history with the patient and they are unchanged from previous note.  ALLERGIES:  is allergic to contrast media [iodinated contrast media], fluorescein, iodine, molds & smuts,  shellfish-derived products, pholcodine, azithromycin, codeine, metformin hcl er, oxycodone, tramadol hcl, and other.  MEDICATIONS:  Current Outpatient Medications  Medication Sig Dispense Refill   acetaminophen (TYLENOL) 500 MG tablet Take 500 mg by mouth every 6 (six) hours as needed for moderate pain.     aspirin EC 81 MG EC tablet Take 1 tablet (81 mg total) by mouth daily.     Biotin 5000 MCG CAPS Take 5,000 mcg by mouth daily with breakfast.      cycloSPORINE (RESTASIS) 0.05 % ophthalmic emulsion Place 1 drop into both eyes 2 (two) times daily. 0.4 mL 0   diclofenac Sodium (VOLTAREN) 1 % GEL Apply 2 g topically 4 (four) times daily. (Patient taking differently: Apply 1 application  topically daily as needed (pain).) 2 g 0   diphenhydrAMINE 25 mg in sodium chloride 0.9 % 50 mL Inject 25 mg into the vein See admin instructions. Administer 25 mg intravenously at start of Gamunex infusion, then administer 25 mg during infusion     diphenoxylate-atropine (LOMOTIL) 2.5-0.025 MG tablet Take 1-2 tablets by mouth 4 (four) times daily as needed for diarrhea or loose stools. 30 tablet 2   ELDERBERRY PO Take  1 tablet by mouth daily.     empagliflozin (JARDIANCE) 25 MG TABS tablet Take 25 mg by mouth daily.     esomeprazole (NEXIUM) 40 MG capsule Take 1 capsule (40 mg total) by mouth 2 (two) times daily. 60 capsule 0   Evolocumab with Infusor ([place]) 420 MG/3.5ML SOCT Inject 420 mg into the skin every 30 (thirty) days.     Exenatide ER (BYDUREON BCISE) 2 MG/0.85ML AUIJ Inject 2 mg into the skin every Monday.     ezetimibe (ZETIA) 10 MG tablet TAKE 1 TABLET BY MOUTH EVERY DAY 90 tablet 1   furosemide (LASIX) 40 MG tablet Take 40 mg by mouth daily as needed for edema.     gabapentin (NEURONTIN) 100 MG capsule Take 1 capsule (100 mg total) by mouth at bedtime. Increase to 2 cap at night in 2 weeks if needed 30 capsule 0   Immune Globulin, Human, 40 GM/400ML SOLN Inject into the vein  every 6 (six) weeks. Infusions are administered at home by home health nurse - last infusion 09/08/21 and 09/09/21; next infusion due 10/19/21 and 10/20/21     insulin degludec (TRESIBA FLEXTOUCH) 100 UNIT/ML FlexTouch Pen Inject 15 Units into the skin in the morning.     Insulin Disposable Pump (OMNIPOD DASH 5 PACK PODS) MISC Use with Humalog     insulin lispro (HUMALOG) 100 UNIT/ML injection Inject into the skin See admin instructions. Inject subcutaneously 3 (three) times daily with meals With meals through the Omnipod Dash Pump every 72 hours as directed     levothyroxine (SYNTHROID) 175 MCG tablet Take 175 mcg by mouth daily before breakfast.     lidocaine-prilocaine (EMLA) cream Apply to affected area once 30 g 3   loperamide (IMODIUM A-D) 2 MG tablet Take 2 mg by mouth 4 (four) times daily as needed for diarrhea or loose stools.     loratadine (CLARITIN) 10 MG tablet Take 1 tablet (10 mg total) by mouth at bedtime. 30 tablet 0   meclizine (ANTIVERT) 25 MG tablet Take 25 mg by mouth 3 (three) times daily as needed for dizziness or nausea (or migraine-related nausea).     montelukast (SINGULAIR) 10 MG tablet Take 1 tablet (10 mg total) by mouth every morning. 30 tablet 0   mycophenolate (CELLCEPT) 500 MG tablet Take 1,000 mg by mouth 2 (two) times daily. For myasthenia gravis     olmesartan (BENICAR) 20 MG tablet Take 1 tablet (20 mg total) by mouth daily. 30 tablet 0   ondansetron (ZOFRAN) 8 MG tablet Take 1 tablet (8 mg total) by mouth every 8 (eight) hours as needed for nausea or vomiting. 30 tablet 1   predniSONE (DELTASONE) 2.5 MG tablet Take 7.5 mg by mouth every morning. For myasthenia gravis     PRESCRIPTION MEDICATION Pt uses a cpap nightly     PROAIR HFA 108 (90 BASE) MCG/ACT inhaler Inhale 2 puffs into the lungs every 6 (six) hours as needed for wheezing or shortness of breath.      prochlorperazine (COMPAZINE) 10 MG tablet Take 1 tablet (10 mg total) by mouth every 6 (six) hours as  needed for nausea or vomiting. 30 tablet 1   riTUXimab (RITUXAN) 500 MG/50ML injection Inject into the vein every 6 (six) months.     topiramate (TOPAMAX) 50 MG tablet Take 1 tablet (50 mg total) by mouth at bedtime. 90 tablet 3   traMADol (ULTRAM) 50 MG tablet Take 1 tablet (50 mg total) by mouth  every 6 (six) hours as needed for moderate pain. 25 tablet 0   vitamin B-12 (CYANOCOBALAMIN) 1000 MCG tablet Take 1,000 mcg by mouth daily.     Vitamin D, Ergocalciferol, (DRISDOL) 50000 UNITS CAPS capsule Take 50,000 Units by mouth every Monday.      rosuvastatin (CRESTOR) 40 MG tablet Take 1 tablet (40 mg total) by mouth daily. 90 tablet 3   No current facility-administered medications for this visit.    PHYSICAL EXAMINATION: ECOG PERFORMANCE STATUS: 2 - Symptomatic, <50% confined to bed  Vitals:   11/17/22 0824  BP: (!) 183/89  Pulse: (!) 101  Resp: 15  Temp: 97.9 F (36.6 C)  SpO2: 100%   Wt Readings from Last 3 Encounters:  11/17/22 195 lb 3.2 oz (88.5 kg)  11/04/22 196 lb 8 oz (89.1 kg)  10/28/22 196 lb 14.4 oz (89.3 kg)     Constitutional: Oriented to person, place, and time and well-developed, well-nourished, and in no distress.  HENT:  Head: Normocephalic and atraumatic.  Eyes: Conjunctivae are normal. Right eye exhibits no discharge. Left eye exhibits no discharge. No scleral icterus.  Cardiovascular: Normal rate, regular rhythm, normal heart sounds  Pulmonary/Chest: Effort normal and breath sounds normal. No respiratory distress. No wheezes. No rales.  Musculoskeletal: Normal range of motion. Exhibits no edema.  Lymphadenopathy: No cervical adenopathy.  Neurological: Alert and oriented to person, place, and time. Exhibits normal muscle tone. Gait normal. Coordination normal.  Skin: Skin is warm and dry. No rash noted. Not diaphoretic. No erythema. No pallor.  Psychiatric: Mood, memory and judgment normal.     LABORATORY DATA:  I have reviewed the data as listed     Latest Ref Rng & Units 11/17/2022    8:01 AM 11/04/2022    9:03 AM 10/28/2022   11:14 AM  CBC  WBC 4.0 - 10.5 K/uL 4.8  4.6  4.8   Hemoglobin 12.0 - 15.0 g/dL 9.6  8.8  9.2   Hematocrit 36.0 - 46.0 % 28.7  26.3  27.7   Platelets 150 - 400 K/uL 239  262  266         Latest Ref Rng & Units 11/04/2022    9:03 AM 10/28/2022   11:14 AM 10/21/2022    9:51 AM  CMP  Glucose 70 - 99 mg/dL 146  277  186   BUN 8 - 23 mg/dL 25  29  22    Creatinine 0.44 - 1.00 mg/dL 1.27  1.20  1.01   Sodium 135 - 145 mmol/L 140  139  141   Potassium 3.5 - 5.1 mmol/L 3.3  3.8  3.7   Chloride 98 - 111 mmol/L 114  111  113   CO2 22 - 32 mmol/L 20  20  21    Calcium 8.9 - 10.3 mg/dL 8.9  8.8  8.8   Total Protein 6.5 - 8.1 g/dL 8.0  6.7  6.4   Total Bilirubin 0.3 - 1.2 mg/dL 0.3  0.4  0.4   Alkaline Phos 38 - 126 U/L 118  122  128   AST 15 - 41 U/L 19  19  16    ALT 0 - 44 U/L 20  16  18        RADIOGRAPHIC STUDIES: I have personally reviewed the radiological images as listed and agreed with the findings in the report. No results found.   ASSESSMENT:  ELTA KAZAR is a 72 y.o. female with breast cancer who presents for a follow up.  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 -Recommendation was adjuvant chemo with weekly abraxane X12 doses.  -Developed significant neuropathy after cycle 2. Symptoms improved after warm gloves and blanket at night. Abraxane ws dose reduced to 60 mg/m   Myasthenia gravis ([place]) -She is on Rituxan every 6 months, CellCept and prednisone. 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   -She will continue cryotherapy during chemo, and use warm gloves when the blanket at night. -Patient has prescription for gabapentin but has not started therapy yet.     PLAN: -Due for Cycle 2, Day 1 of Abraxane today. -Labs from today were reviewed and adequate for treatment. WBC 4.8, Hgb 9.6, Plt 239. Creatinine has normalized. LFTs normal.  -Encouraged patient to use imodium and lomotil to minimize diarrhea.  -No prohibitive toxicities with stable neuropathy -Proceed with treatment today as planned.  -Continue with weekly abraxane infusions and return for toxicity check in 2 weeks with Dr. Burr Medico  No orders of the defined types were placed in this encounter.  All questions were answered. The patient knows to call the clinic with any problems, questions or concerns. No barriers to learning was detected.  I have spent a total of 30 minutes minutes of face-to-face and non-face-to-face time, preparing to see the patient, performing a medically appropriate examination, counseling and educating the patient, ordering tests/procedures, documenting clinical information in the electronic health record,  and care coordination.   Dede Query PA-C Dept of Hematology and [place] at [facility] Phone: [phone]

## 2022-11-24 DIAGNOSIS — G4733 Obstructive sleep apnea (adult) (pediatric): Secondary | ICD-10-CM | POA: Diagnosis not present

## 2022-11-25 ENCOUNTER — Inpatient Hospital Stay: Payer: Medicare Other

## 2022-11-25 ENCOUNTER — Other Ambulatory Visit: Payer: Self-pay

## 2022-11-25 ENCOUNTER — Inpatient Hospital Stay: Payer: Medicare Other | Attending: Hematology

## 2022-11-25 VITALS — BP 174/75 | HR 99 | Temp 98.0°F | Resp 17 | Ht 63.0 in | Wt 198.8 lb

## 2022-11-25 DIAGNOSIS — Z7952 Long term (current) use of systemic steroids: Secondary | ICD-10-CM | POA: Diagnosis not present

## 2022-11-25 DIAGNOSIS — Z5111 Encounter for antineoplastic chemotherapy: Secondary | ICD-10-CM | POA: Diagnosis not present

## 2022-11-25 DIAGNOSIS — G7000 Myasthenia gravis without (acute) exacerbation: Secondary | ICD-10-CM | POA: Diagnosis not present

## 2022-11-25 DIAGNOSIS — Z9049 Acquired absence of other specified parts of digestive tract: Secondary | ICD-10-CM | POA: Diagnosis not present

## 2022-11-25 DIAGNOSIS — Z170 Estrogen receptor positive status [ER+]: Secondary | ICD-10-CM | POA: Diagnosis not present

## 2022-11-25 DIAGNOSIS — Z7984 Long term (current) use of oral hypoglycemic drugs: Secondary | ICD-10-CM | POA: Diagnosis not present

## 2022-11-25 DIAGNOSIS — Z794 Long term (current) use of insulin: Secondary | ICD-10-CM | POA: Insufficient documentation

## 2022-11-25 DIAGNOSIS — C50412 Malignant neoplasm of upper-outer quadrant of left female breast: Secondary | ICD-10-CM | POA: Diagnosis not present

## 2022-11-25 DIAGNOSIS — Z7982 Long term (current) use of aspirin: Secondary | ICD-10-CM | POA: Diagnosis not present

## 2022-11-25 DIAGNOSIS — Z79899 Other long term (current) drug therapy: Secondary | ICD-10-CM | POA: Insufficient documentation

## 2022-11-25 DIAGNOSIS — E1142 Type 2 diabetes mellitus with diabetic polyneuropathy: Secondary | ICD-10-CM | POA: Diagnosis not present

## 2022-11-25 DIAGNOSIS — Z90710 Acquired absence of both cervix and uterus: Secondary | ICD-10-CM | POA: Insufficient documentation

## 2022-11-25 DIAGNOSIS — Z79633 Long term (current) use of mitotic inhibitor: Secondary | ICD-10-CM | POA: Insufficient documentation

## 2022-11-25 LAB — CBC WITH DIFFERENTIAL (CANCER CENTER ONLY)
Abs Immature Granulocytes: 0.03 10*3/uL (ref 0.00–0.07)
Basophils Absolute: 0 10*3/uL (ref 0.0–0.1)
Basophils Relative: 1 %
Eosinophils Absolute: 0.1 10*3/uL (ref 0.0–0.5)
Eosinophils Relative: 2 %
HCT: 26.7 % — ABNORMAL LOW (ref 36.0–46.0)
Hemoglobin: 8.9 g/dL — ABNORMAL LOW (ref 12.0–15.0)
Immature Granulocytes: 1 %
Lymphocytes Relative: 31 %
Lymphs Abs: 1.6 10*3/uL (ref 0.7–4.0)
MCH: 31 pg (ref 26.0–34.0)
MCHC: 33.3 g/dL (ref 30.0–36.0)
MCV: 93 fL (ref 80.0–100.0)
Monocytes Absolute: 0.5 10*3/uL (ref 0.1–1.0)
Monocytes Relative: 9 %
Neutro Abs: 3 10*3/uL (ref 1.7–7.7)
Neutrophils Relative %: 56 %
Platelet Count: 252 10*3/uL (ref 150–400)
RBC: 2.87 MIL/uL — ABNORMAL LOW (ref 3.87–5.11)
RDW: 13.2 % (ref 11.5–15.5)
WBC Count: 5.3 10*3/uL (ref 4.0–10.5)
nRBC: 0 % (ref 0.0–0.2)

## 2022-11-25 LAB — CMP (CANCER CENTER ONLY)
ALT: 28 U/L (ref 0–44)
AST: 32 U/L (ref 15–41)
Albumin: 3.7 g/dL (ref 3.5–5.0)
Alkaline Phosphatase: 147 U/L — ABNORMAL HIGH (ref 38–126)
Anion gap: 6 (ref 5–15)
BUN: 27 mg/dL — ABNORMAL HIGH (ref 8–23)
CO2: 22 mmol/L (ref 22–32)
Calcium: 9.2 mg/dL (ref 8.9–10.3)
Chloride: 113 mmol/L — ABNORMAL HIGH (ref 98–111)
Creatinine: 1.36 mg/dL — ABNORMAL HIGH (ref 0.44–1.00)
GFR, Estimated: 42 mL/min — ABNORMAL LOW (ref >60)
Glucose, Bld: 162 mg/dL — ABNORMAL HIGH (ref 70–99)
Potassium: 3.7 mmol/L (ref 3.5–5.1)
Sodium: 141 mmol/L (ref 135–145)
Total Bilirubin: 0.2 mg/dL — ABNORMAL LOW (ref 0.3–1.2)
Total Protein: 7 g/dL (ref 6.5–8.1)

## 2022-11-25 MED ORDER — SODIUM CHLORIDE 0.9 % IV SOLN: Freq: Once | INTRAVENOUS | Status: AC

## 2022-11-25 MED ORDER — HEPARIN SOD (PORK) LOCK FLUSH 100 UNIT/ML IV SOLN
500.0000 [IU] | Freq: Once | INTRAVENOUS | Status: AC | PRN
  Administered 2022-11-25: 500 [IU]

## 2022-11-25 MED ORDER — PROCHLORPERAZINE MALEATE 10 MG PO TABS
10.0000 mg | ORAL_TABLET | Freq: Once | ORAL | Status: AC
  Administered 2022-11-25: 10 mg via ORAL
  Filled 2022-11-25: qty 1

## 2022-11-25 MED ORDER — SODIUM CHLORIDE 0.9% FLUSH
10.0000 mL | INTRAVENOUS | Status: DC | PRN
  Administered 2022-11-25: 10 mL

## 2022-11-25 MED ORDER — SODIUM CHLORIDE 0.9 % IV SOLN: INTRAVENOUS | Status: DC

## 2022-11-25 MED ORDER — PACLITAXEL PROTEIN-BOUND CHEMO INJECTION 100 MG
60.0000 mg/m2 | Freq: Once | INTRAVENOUS | Status: AC
  Administered 2022-11-25: 125 mg via INTRAVENOUS
  Filled 2022-11-25: qty 25

## 2022-11-25 NOTE — Patient Instructions (Signed)
[facility]  Discharge Instructions: Thank you for choosing [facility] to provide your oncology and hematology care.   If you have a lab appointment with the [facility], please go directly to the [facility] and check in at the registration area.   Wear comfortable clothing and clothing appropriate for easy access to any Portacath or PICC line.   We strive to give you quality time with your provider. You may need to reschedule your appointment if you arrive late (15 or more minutes).  Arriving late affects you and other patients whose appointments are after yours.  Also, if you miss three or more appointments without notifying the office, you may be dismissed from the clinic at the provider's discretion.      For prescription refill requests, have your pharmacy contact our office and allow 72 hours for refills to be completed.    Today you received the following chemotherapy and/or immunotherapy agents: Abraxane      To help prevent nausea and vomiting after your treatment, we encourage you to take your nausea medication as directed.  BELOW ARE SYMPTOMS THAT SHOULD BE REPORTED IMMEDIATELY: *FEVER GREATER THAN 100.4 F (38 C) OR HIGHER *CHILLS OR SWEATING *NAUSEA AND VOMITING THAT IS NOT CONTROLLED WITH YOUR NAUSEA MEDICATION *UNUSUAL SHORTNESS OF BREATH *UNUSUAL BRUISING OR BLEEDING *URINARY PROBLEMS (pain or burning when urinating, or frequent urination) *BOWEL PROBLEMS (unusual diarrhea, constipation, pain near the anus) TENDERNESS IN MOUTH AND THROAT WITH OR WITHOUT PRESENCE OF ULCERS (sore throat, sores in mouth, or a toothache) UNUSUAL RASH, SWELLING OR PAIN  UNUSUAL VAGINAL DISCHARGE OR ITCHING   Items with * indicate a potential emergency and should be followed up as soon as possible or go to the Emergency Department if any problems should occur.  Please show the CHEMOTHERAPY ALERT CARD or IMMUNOTHERAPY ALERT CARD at  check-in to the Emergency Department and triage nurse.  Should you have questions after your visit or need to cancel or reschedule your appointment, please contact [facility]  Dept: [phone]  and follow the prompts.  Office hours are 8:00 a.m. to 4:30 p.m. Monday - Friday. Please note that voicemails left after 4:00 p.m. may not be returned until the following business day.  We are closed weekends and major holidays. You have access to a nurse at all times for urgent questions. Please call the main number to the clinic Dept: [phone] and follow the prompts.   For any non-urgent questions, you may also contact your provider using MyChart. We now offer e-Visits for anyone 38 and older to request care online for non-urgent symptoms. For details visit [email or web address].   Also download the MyChart app! Go to the app store, search "MyChart", open the app, select [facility], and log in with your MyChart username and password.

## 2022-12-01 NOTE — Assessment & Plan Note (Signed)
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  I reduced her Abraxane dose to 60 mg/m and continue

## 2022-12-01 NOTE — Progress Notes (Unsigned)
[facility]   [phone] [phone]   Clinic Follow up Note   Patient Care Team: Irena Reichmann, DO as PCP - General (Family Medicine) Micki Riley, MD as Consulting Physician (Neurology) Ihor Austin, NP as Nurse Practitioner (Neurology) Donnelly Angelica, RN as Oncology Nurse Navigator Pershing Proud, RN as Oncology Nurse Navigator Abigail Miyamoto, MD as Consulting Physician (General Surgery) Malachy Mood, MD as Consulting Physician (Hematology) Dorothy Puffer, MD as Consulting Physician (Radiation Oncology) Dianne Dun, MD as Consulting Physician (Rheumatology)  Date of Service:  12/01/2022  CHIEF COMPLAINT: f/u of  left breast cancer    CURRENT THERAPY:  Adjuvant chemotherapy with Abraxane weekly for 12 weeks, starting 10/21/22   ASSESSMENT: *** [name] is a 72 y.o. female with   No problem-specific Assessment & Plan notes found for this encounter.  ***   PLAN:     SUMMARY OF ONCOLOGIC HISTORY: Oncology History Overview Note   Cancer Staging  Malignant neoplasm of upper-outer quadrant of left breast in female, estrogen receptor positive (HCC) Staging form: Breast, AJCC 8th Edition - Clinical: Stage IB (cT1b, cN0, cM0, G3, ER+, PR-, HER2-) - Signed by Ronny Bacon, PA-C on 08/10/2022 Stage prefix: Initial diagnosis Method of lymph node assessment: Clinical Histologic grading system: 3 grade system - Pathologic stage from 09/02/2022: Stage IB (pT1b, pN0, cM0, G2, ER-, PR-, HER2-) - Signed by Malachy Mood, MD on 09/23/2022 Stage prefix: Initial diagnosis Histologic grading system: 3 grade system Residual tumor (R): R0 - None     Malignant neoplasm of upper-outer quadrant of left breast in female, estrogen receptor positive  08/10/2022 Initial Diagnosis   Malignant neoplasm of upper-outer quadrant of left female breast (HCC)   08/10/2022 Cancer Staging   Staging form: Breast, AJCC 8th Edition - Clinical: Stage  IB (cT1b, cN0, cM0, G3, ER+, PR-, HER2-) - Signed by Ronny Bacon, PA-C on 08/10/2022 Stage prefix: Initial diagnosis Method of lymph node assessment: Clinical Histologic grading system: 3 grade system   09/02/2022 Cancer Staging   Staging form: Breast, AJCC 8th Edition - Pathologic stage from 09/02/2022: Stage IB (pT1b, pN0, cM0, G2, ER-, PR-, HER2-) - Signed by Malachy Mood, MD on 09/23/2022 Stage prefix: Initial diagnosis Histologic grading system: 3 grade system Residual tumor (R): R0 - None   10/21/2022 -  Chemotherapy   Patient is on Treatment Plan : BREAST PACLitaxel-Albumin (Abraxane) (100) D1,8,15 q28d        INTERVAL HISTORY: *** [name] is here for a follow up of left breast cancer   She was last seen by PA-C Sue Lush.Marland Kitchen on 11/17/2022 She presents to the clinic   All other systems were reviewed with the patient and are negative.  MEDICAL HISTORY:  Past Medical History:  Diagnosis Date   Arthritis    "all over my body" (03/13/2013)   Asthma    Asymptomatic carotid artery stenosis, bilateral 10/08/2018   Breast cancer (HCC)    GERD (gastroesophageal reflux disease)    H/O hiatal hernia    Headache    pt states she has had headaches for about 6 months "off and on"   Heart murmur    pt had echocardiogram on 09/17/21   Hypercholesterolemia 10/08/2018   Hypertension    Hypothyroidism    Myasthenia gravis (HCC)    "in my eyes; diagnsosed > 7 yr ago" (03/13/2013)   Sleep apnea    on CPAP   Stroke (HCC) 2021   Thyroid carcinoma (  HCC)    Type II diabetes mellitus (HCC)     SURGICAL HISTORY: Past Surgical History:  Procedure Laterality Date   ABDOMINAL HYSTERECTOMY     ANTERIOR CERVICAL DECOMP/DISCECTOMY FUSION     "I've had severa ORs; always went in from the front" (03/13/2013)   APPENDECTOMY     BREAST LUMPECTOMY WITH RADIOACTIVE SEED AND SENTINEL LYMPH NODE BIOPSY Left 09/02/2022   Procedure: LEFT BREAST LUMPECTOMY WITH RADIOACTIVE SEED AND  SENTINEL LYMPH NODE BIOPSY;  Surgeon: Abigail Miyamoto, MD;  Location: [facility] OR;  Service: General;  Laterality: Left;   CARDIAC CATHETERIZATION     "several" (03/13/2013)   CARPAL TUNNEL RELEASE Right    CATARACT EXTRACTION W/ INTRAOCULAR LENS  IMPLANT, BILATERAL Bilateral    CHOLECYSTECTOMY     KNEE ARTHROSCOPY Left    SHOULDER ARTHROSCOPY W/ ROTATOR CUFF REPAIR Left    TONSILLECTOMY     TOTAL THYROIDECTOMY     TRANSCAROTID ARTERY REVASCULARIZATION  Left 09/24/2021   Procedure: LEFT TRANSCAROTID ARTERY REVASCULARIZATION;  Surgeon: Leonie Douglas, MD;  Location: [facility] OR;  Service: Vascular;  Laterality: Left;   TRANSCAROTID ARTERY REVASCULARIZATION  Right 10/23/2021   Procedure: Right Transcarotid Artery Revascularization;  Surgeon: Leonie Douglas, MD;  Location: [facility] OR;  Service: Vascular;  Laterality: Right;   ULTRASOUND GUIDANCE FOR VASCULAR ACCESS Right 09/24/2021   Procedure: ULTRASOUND GUIDANCE FOR VASCULAR ACCESS;  Surgeon: Leonie Douglas, MD;  Location: [facility] OR;  Service: Vascular;  Laterality: Right;   ULTRASOUND GUIDANCE FOR VASCULAR ACCESS Right 10/23/2021   Procedure: ULTRASOUND GUIDANCE FOR VASCULAR ACCESS, LEFT FEMORAL VEIN;  Surgeon: Leonie Douglas, MD;  Location: [facility] OR;  Service: Vascular;  Laterality: Right;    I have reviewed the social history and family history with the patient and they are unchanged from previous note.  ALLERGIES:  is allergic to contrast media [iodinated contrast media], fluorescein, iodine, molds & smuts, shellfish-derived products, pholcodine, azithromycin, codeine, metformin hcl er, oxycodone, tramadol hcl, and other.  MEDICATIONS:  Current Outpatient Medications  Medication Sig Dispense Refill   acetaminophen (TYLENOL) 500 MG tablet Take 500 mg by mouth every 6 (six) hours as needed for moderate pain.     aspirin EC 81 MG EC tablet Take 1 tablet (81 mg total) by mouth daily.     Biotin 5000 MCG CAPS Take 5,000 mcg by mouth daily with breakfast.       cycloSPORINE (RESTASIS) 0.05 % ophthalmic emulsion Place 1 drop into both eyes 2 (two) times daily. 0.4 mL 0   diclofenac Sodium (VOLTAREN) 1 % GEL Apply 2 g topically 4 (four) times daily. (Patient taking differently: Apply 1 application  topically daily as needed (pain).) 2 g 0   diphenhydrAMINE 25 mg in sodium chloride 0.9 % 50 mL Inject 25 mg into the vein See admin instructions. Administer 25 mg intravenously at start of Gamunex infusion, then administer 25 mg during infusion     diphenoxylate-atropine (LOMOTIL) 2.5-0.025 MG tablet Take 1-2 tablets by mouth 4 (four) times daily as needed for diarrhea or loose stools. 30 tablet 2   ELDERBERRY PO Take 1 tablet by mouth daily.     empagliflozin (JARDIANCE) 25 MG TABS tablet Take 25 mg by mouth daily.     esomeprazole (NEXIUM) 40 MG capsule Take 1 capsule (40 mg total) by mouth 2 (two) times daily. 60 capsule 0   Evolocumab with Infusor (REPATHA PUSHTRONEX SYSTEM) 420 MG/3.5ML SOCT Inject 420 mg into the skin every 30 (thirty) days.  Exenatide ER (BYDUREON BCISE) 2 MG/0.85ML AUIJ Inject 2 mg into the skin every Monday.     ezetimibe (ZETIA) 10 MG tablet TAKE 1 TABLET BY MOUTH EVERY DAY 90 tablet 1   furosemide (LASIX) 40 MG tablet Take 40 mg by mouth daily as needed for edema.     gabapentin (NEURONTIN) 100 MG capsule Take 1 capsule (100 mg total) by mouth at bedtime. Increase to 2 cap at night in 2 weeks if needed 30 capsule 0   Immune Globulin, Human, 40 GM/400ML SOLN Inject into the vein every 6 (six) weeks. Infusions are administered at home by home health nurse - last infusion 09/08/21 and 09/09/21; next infusion due 10/19/21 and 10/20/21     insulin degludec (TRESIBA FLEXTOUCH) 100 UNIT/ML FlexTouch Pen Inject 15 Units into the skin in the morning.     Insulin Disposable Pump (OMNIPOD DASH 5 PACK PODS) MISC Use with Humalog     insulin lispro (HUMALOG) 100 UNIT/ML injection Inject into the skin See admin instructions. Inject subcutaneously 3  (three) times daily with meals With meals through the Omnipod Dash Pump every 72 hours as directed     levothyroxine (SYNTHROID) 175 MCG tablet Take 175 mcg by mouth daily before breakfast.     lidocaine-prilocaine (EMLA) cream Apply to affected area once 30 g 3   loperamide (IMODIUM A-D) 2 MG tablet Take 2 mg by mouth 4 (four) times daily as needed for diarrhea or loose stools.     loratadine (CLARITIN) 10 MG tablet Take 1 tablet (10 mg total) by mouth at bedtime. 30 tablet 0   meclizine (ANTIVERT) 25 MG tablet Take 25 mg by mouth 3 (three) times daily as needed for dizziness or nausea (or migraine-related nausea).     montelukast (SINGULAIR) 10 MG tablet Take 1 tablet (10 mg total) by mouth every morning. 30 tablet 0   mycophenolate (CELLCEPT) 500 MG tablet Take 1,000 mg by mouth 2 (two) times daily. For myasthenia gravis     olmesartan (BENICAR) 20 MG tablet Take 1 tablet (20 mg total) by mouth daily. 30 tablet 0   ondansetron (ZOFRAN) 8 MG tablet Take 1 tablet (8 mg total) by mouth every 8 (eight) hours as needed for nausea or vomiting. 30 tablet 1   predniSONE (DELTASONE) 2.5 MG tablet Take 7.5 mg by mouth every morning. For myasthenia gravis     PRESCRIPTION MEDICATION Pt uses a cpap nightly     PROAIR HFA 108 (90 BASE) MCG/ACT inhaler Inhale 2 puffs into the lungs every 6 (six) hours as needed for wheezing or shortness of breath.      prochlorperazine (COMPAZINE) 10 MG tablet Take 1 tablet (10 mg total) by mouth every 6 (six) hours as needed for nausea or vomiting. 30 tablet 1   riTUXimab (RITUXAN) 500 MG/50ML injection Inject into the vein every 6 (six) months.     rosuvastatin (CRESTOR) 40 MG tablet Take 1 tablet (40 mg total) by mouth daily. 90 tablet 3   topiramate (TOPAMAX) 50 MG tablet Take 1 tablet (50 mg total) by mouth at bedtime. 90 tablet 3   traMADol (ULTRAM) 50 MG tablet Take 1 tablet (50 mg total) by mouth every 6 (six) hours as needed for moderate pain. 25 tablet 0   vitamin  B-12 (CYANOCOBALAMIN) 1000 MCG tablet Take 1,000 mcg by mouth daily.     Vitamin D, Ergocalciferol, (DRISDOL) 50000 UNITS CAPS capsule Take 50,000 Units by mouth every Monday.      No  current facility-administered medications for this visit.    PHYSICAL EXAMINATION: ECOG PERFORMANCE STATUS: {CHL ONC ECOG [phone]}  There were no vitals filed for this visit. Wt Readings from Last 3 Encounters:  11/25/22 198 lb 12.8 oz (90.2 kg)  11/17/22 195 lb 3.2 oz (88.5 kg)  11/04/22 196 lb 8 oz (89.1 kg)    {Only keep what was examined. If exam not performed, can use .CEXAM } GENERAL:alert, no distress and comfortable SKIN: skin color, texture, turgor are normal, no rashes or significant lesions EYES: normal, Conjunctiva are pink and non-injected, sclera clear {OROPHARYNX:no exudate, no erythema and lips, buccal mucosa, and tongue normal}  NECK: supple, thyroid normal size, non-tender, without nodularity LYMPH:  no palpable lymphadenopathy in the cervical, axillary {or inguinal} LUNGS: clear to auscultation and percussion with normal breathing effort HEART: regular rate & rhythm and no murmurs and no lower extremity edema ABDOMEN:abdomen soft, non-tender and normal bowel sounds Musculoskeletal:no cyanosis of digits and no clubbing  NEURO: alert & oriented x 3 with fluent speech, no focal motor/sensory deficits  LABORATORY DATA:  I have reviewed the data as listed    Latest Ref Rng & Units 11/25/2022    8:26 AM 11/17/2022    8:01 AM 11/04/2022    9:03 AM  CBC  WBC 4.0 - 10.5 K/uL 5.3  4.8  4.6   Hemoglobin 12.0 - 15.0 g/dL 8.9  9.6  8.8   Hematocrit 36.0 - 46.0 % 26.7  28.7  26.3   Platelets 150 - 400 K/uL 252  239  262         Latest Ref Rng & Units 11/25/2022    8:26 AM 11/17/2022    8:01 AM 11/04/2022    9:03 AM  CMP  Glucose 70 - 99 mg/dL 161  096  045   BUN 8 - 23 mg/dL Creatinine 0.44 - 1.00 mg/dL 4.09  8.11  9.14   Sodium 135 - 145 mmol/L 141  140  140    Potassium 3.5 - 5.1 mmol/L 3.7  3.5  3.3   Chloride 98 - 111 mmol/L 113  112  114   CO2 22 - 32 mmol/L Calcium 8.9 - 10.3 mg/dL 9.2  9.3  8.9   Total Protein 6.5 - 8.1 g/dL 7.0  7.3  8.0   Total Bilirubin 0.3 - 1.2 mg/dL 0.2  0.3  0.3   Alkaline Phos 38 - 126 U/L 147  144  118   AST 15 - 41 U/L 32  28  19   ALT 0 - 44 U/L RADIOGRAPHIC STUDIES: I have personally reviewed the radiological images as listed and agreed with the findings in the report. No results found.    No orders of the defined types were placed in this encounter.  All questions were answered. The patient knows to call the clinic with any problems, questions or concerns. No barriers to learning was detected. The total time spent in the appointment was {CHL ONC TIME VISIT - [id number]}.     Salome Holmes, CMA 12/01/2022   I, Monica Martinez, CMA, am acting as scribe for Malachy Mood, MD.   {Add scribe attestation statement}

## 2022-12-01 NOTE — Assessment & Plan Note (Signed)
-  secondary to chemo, her DM may contribute also -I called in gabapentin, she has not started yet.  She will continue cryotherapy during chemo, and use warm gloves when the blanket at night.

## 2022-12-01 NOTE — Assessment & Plan Note (Signed)
-  She is on Rituxan every 6 months, CellCept and prednisone.  will continue during chemo  -I previously discussed with her neurologist  

## 2022-12-02 ENCOUNTER — Other Ambulatory Visit: Payer: Self-pay

## 2022-12-02 ENCOUNTER — Encounter: Payer: Self-pay | Admitting: *Deleted

## 2022-12-02 ENCOUNTER — Inpatient Hospital Stay: Payer: Medicare Other

## 2022-12-02 ENCOUNTER — Inpatient Hospital Stay: Payer: Medicare Other | Admitting: Hematology

## 2022-12-02 ENCOUNTER — Encounter: Payer: Self-pay | Admitting: Hematology

## 2022-12-02 VITALS — BP 169/68 | HR 100 | Resp 18

## 2022-12-02 VITALS — BP 165/62 | HR 98 | Temp 98.0°F | Resp 18 | Ht 63.0 in | Wt 197.5 lb

## 2022-12-02 DIAGNOSIS — Z794 Long term (current) use of insulin: Secondary | ICD-10-CM | POA: Diagnosis not present

## 2022-12-02 DIAGNOSIS — Z5111 Encounter for antineoplastic chemotherapy: Secondary | ICD-10-CM | POA: Diagnosis not present

## 2022-12-02 DIAGNOSIS — G620 Drug-induced polyneuropathy: Secondary | ICD-10-CM | POA: Diagnosis not present

## 2022-12-02 DIAGNOSIS — E1142 Type 2 diabetes mellitus with diabetic polyneuropathy: Secondary | ICD-10-CM | POA: Diagnosis not present

## 2022-12-02 DIAGNOSIS — Z170 Estrogen receptor positive status [ER+]: Secondary | ICD-10-CM

## 2022-12-02 DIAGNOSIS — Z9049 Acquired absence of other specified parts of digestive tract: Secondary | ICD-10-CM | POA: Diagnosis not present

## 2022-12-02 DIAGNOSIS — G7000 Myasthenia gravis without (acute) exacerbation: Secondary | ICD-10-CM

## 2022-12-02 DIAGNOSIS — Z79633 Long term (current) use of mitotic inhibitor: Secondary | ICD-10-CM | POA: Diagnosis not present

## 2022-12-02 DIAGNOSIS — Z7952 Long term (current) use of systemic steroids: Secondary | ICD-10-CM | POA: Diagnosis not present

## 2022-12-02 DIAGNOSIS — C50412 Malignant neoplasm of upper-outer quadrant of left female breast: Secondary | ICD-10-CM | POA: Diagnosis not present

## 2022-12-02 DIAGNOSIS — Z7984 Long term (current) use of oral hypoglycemic drugs: Secondary | ICD-10-CM | POA: Diagnosis not present

## 2022-12-02 DIAGNOSIS — Z95828 Presence of other vascular implants and grafts: Secondary | ICD-10-CM

## 2022-12-02 DIAGNOSIS — Z79899 Other long term (current) drug therapy: Secondary | ICD-10-CM | POA: Diagnosis not present

## 2022-12-02 DIAGNOSIS — Z7982 Long term (current) use of aspirin: Secondary | ICD-10-CM | POA: Diagnosis not present

## 2022-12-02 LAB — CMP (CANCER CENTER ONLY)
ALT: 28 U/L (ref 0–44)
AST: 27 U/L (ref 15–41)
Albumin: 3.8 g/dL (ref 3.5–5.0)
Alkaline Phosphatase: 160 U/L — ABNORMAL HIGH (ref 38–126)
Anion gap: 8 (ref 5–15)
BUN: 28 mg/dL — ABNORMAL HIGH (ref 8–23)
CO2: 20 mmol/L — ABNORMAL LOW (ref 22–32)
Calcium: 9.2 mg/dL (ref 8.9–10.3)
Chloride: 113 mmol/L — ABNORMAL HIGH (ref 98–111)
Creatinine: 1.2 mg/dL — ABNORMAL HIGH (ref 0.44–1.00)
GFR, Estimated: 49 mL/min — ABNORMAL LOW (ref >60)
Glucose, Bld: 231 mg/dL — ABNORMAL HIGH (ref 70–99)
Potassium: 3.7 mmol/L (ref 3.5–5.1)
Sodium: 141 mmol/L (ref 135–145)
Total Bilirubin: 0.4 mg/dL (ref 0.3–1.2)
Total Protein: 6.9 g/dL (ref 6.5–8.1)

## 2022-12-02 LAB — CBC WITH DIFFERENTIAL (CANCER CENTER ONLY)
Abs Immature Granulocytes: 0.06 10*3/uL (ref 0.00–0.07)
Basophils Absolute: 0 10*3/uL (ref 0.0–0.1)
Basophils Relative: 1 %
Eosinophils Absolute: 0.1 10*3/uL (ref 0.0–0.5)
Eosinophils Relative: 2 %
HCT: 27.2 % — ABNORMAL LOW (ref 36.0–46.0)
Hemoglobin: 9 g/dL — ABNORMAL LOW (ref 12.0–15.0)
Immature Granulocytes: 1 %
Lymphocytes Relative: 16 %
Lymphs Abs: 1.1 10*3/uL (ref 0.7–4.0)
MCH: 31.3 pg (ref 26.0–34.0)
MCHC: 33.1 g/dL (ref 30.0–36.0)
MCV: 94.4 fL (ref 80.0–100.0)
Monocytes Absolute: 0.6 10*3/uL (ref 0.1–1.0)
Monocytes Relative: 9 %
Neutro Abs: 5 10*3/uL (ref 1.7–7.7)
Neutrophils Relative %: 71 %
Platelet Count: 244 10*3/uL (ref 150–400)
RBC: 2.88 MIL/uL — ABNORMAL LOW (ref 3.87–5.11)
RDW: 14.1 % (ref 11.5–15.5)
WBC Count: 6.9 10*3/uL (ref 4.0–10.5)
nRBC: 0 % (ref 0.0–0.2)

## 2022-12-02 MED ORDER — PACLITAXEL PROTEIN-BOUND CHEMO INJECTION 100 MG
60.0000 mg/m2 | Freq: Once | INTRAVENOUS | Status: AC
  Administered 2022-12-02: 125 mg via INTRAVENOUS
  Filled 2022-12-02: qty 25

## 2022-12-02 MED ORDER — SODIUM CHLORIDE 0.9 % IV SOLN: Freq: Once | INTRAVENOUS | Status: DC

## 2022-12-02 MED ORDER — SODIUM CHLORIDE 0.9 % IV SOLN: Freq: Once | INTRAVENOUS | Status: AC

## 2022-12-02 MED ORDER — SODIUM CHLORIDE 0.9% FLUSH
10.0000 mL | Freq: Once | INTRAVENOUS | Status: AC
  Administered 2022-12-02: 10 mL

## 2022-12-02 MED ORDER — PROCHLORPERAZINE MALEATE 10 MG PO TABS
10.0000 mg | ORAL_TABLET | Freq: Once | ORAL | Status: AC
  Administered 2022-12-02: 10 mg via ORAL
  Filled 2022-12-02: qty 1

## 2022-12-02 MED ORDER — HEPARIN SOD (PORK) LOCK FLUSH 100 UNIT/ML IV SOLN
500.0000 [IU] | Freq: Once | INTRAVENOUS | Status: AC | PRN
  Administered 2022-12-02: 500 [IU]

## 2022-12-02 MED ORDER — SODIUM CHLORIDE 0.9% FLUSH
10.0000 mL | INTRAVENOUS | Status: DC | PRN
  Administered 2022-12-02: 10 mL

## 2022-12-02 NOTE — Progress Notes (Signed)
This RN received V/O from Dr. Mosetta Putt to administer 500 mL of NS over 1 hour while in infusion today d/t decrease in oral hydration and SCR 1.2 mg/dL. Per Dr. Mosetta Putt OK to infuse extra hydration with premeds, but not concurrently with Abraxane.

## 2022-12-02 NOTE — Patient Instructions (Addendum)
[name]  Discharge Instructions: Thank you for choosing [name] to provide your oncology and hematology care.   If you have a lab appointment with the [name], please go directly to the [name] and check in at the registration area.   Wear comfortable clothing and clothing appropriate for easy access to any Portacath or PICC line.   We strive to give you quality time with your provider. You may need to reschedule your appointment if you arrive late (15 or more minutes).  Arriving late affects you and other patients whose appointments are after yours.  Also, if you miss three or more appointments without notifying the office, you may be dismissed from the clinic at the provider's discretion.      For prescription refill requests, have your pharmacy contact our office and allow 72 hours for refills to be completed.    Today you received the following chemotherapy and/or immunotherapy agents: Abraxane      To help prevent nausea and vomiting after your treatment, we encourage you to take your nausea medication as directed.  BELOW ARE SYMPTOMS THAT SHOULD BE REPORTED IMMEDIATELY: *FEVER GREATER THAN 100.4 F (38 C) OR HIGHER *CHILLS OR SWEATING *NAUSEA AND VOMITING THAT IS NOT CONTROLLED WITH YOUR NAUSEA MEDICATION *UNUSUAL SHORTNESS OF BREATH *UNUSUAL BRUISING OR BLEEDING *URINARY PROBLEMS (pain or burning when urinating, or frequent urination) *BOWEL PROBLEMS (unusual diarrhea, constipation, pain near the anus) TENDERNESS IN MOUTH AND THROAT WITH OR WITHOUT PRESENCE OF ULCERS (sore throat, sores in mouth, or a toothache) UNUSUAL RASH, SWELLING OR PAIN  UNUSUAL VAGINAL DISCHARGE OR ITCHING   Items with * indicate a potential emergency and should be followed up as soon as possible or go to the Emergency Department if any problems should occur.  Please show the CHEMOTHERAPY ALERT CARD or IMMUNOTHERAPY ALERT CARD at  check-in to the Emergency Department and triage nurse.  Should you have questions after your visit or need to cancel or reschedule your appointment, please contact [name]  Dept: [phone]  and follow the prompts.  Office hours are 8:00 a.m. to 4:30 p.m. Monday - Friday. Please note that voicemails left after 4:00 p.m. may not be returned until the following business day.  We are closed weekends and major holidays. You have access to a nurse at all times for urgent questions. Please call the main number to the clinic Dept: [phone] and follow the prompts.   For any non-urgent questions, you may also contact your provider using MyChart. We now offer e-Visits for anyone 40 and older to request care online for non-urgent symptoms. For details visit [email or web address].   Also download the MyChart app! Go to the app store, search "MyChart", open the app, select [facility], and log in with your MyChart username and password.  Rehydration, Older Adult  Rehydration is the replacement of fluids, salts, and minerals in the body (electrolytes) that are lost during dehydration. Dehydration is when there is not enough water or other fluids in the body. This happens when you lose more fluids than you take in. People who are age 70 or older have a higher risk of dehydration than younger adults. This is because in older age, the body: Is less able to maintain the right amount of water. Does not respond to temperature changes as well. Does not get a sense of thirst as easily or quickly. Other causes include: Not drinking enough  fluids. This can occur when you are ill, when you forget to drink, or when you are doing activities that require a lot of energy, especially in hot weather. Conditions that cause loss of water or other fluids. These include diarrhea, vomiting, sweating, or urinating a lot. Other illnesses, such as fever or infection. Certain medicines,  such as those that remove excess fluid from the body (diuretics). Symptoms of mild or moderate dehydration may include thirst, dry lips and mouth, and dizziness. Symptoms of severe dehydration may include increased heart rate, confusion, fainting, and not urinating. In severe cases, you may need to get fluids through an IV at the hospital. For mild or moderate cases, you can usually rehydrate at home by drinking certain fluids as told by your health care provider. What are the risks? Rehydration is usually safe. Taking in too much fluid (overhydration) can be a problem but is rare. Overhydration can cause an imbalance of electrolytes in the body, kidney failure, fluid in the lungs, or a decrease in salt (sodium) levels in the body. Supplies needed: You will need an oral rehydration solution (ORS) if your health care provider tells you to use one. This is a drink to treat dehydration. It can be found in pharmacies and retail stores. How to rehydrate Fluids Follow instructions from your health care provider about what to drink. The kind of fluid and the amount you should drink depend on your condition. In general, you should choose drinks that you prefer. If told by your health care provider, drink an ORS. Make an ORS by following instructions on the package. Start by drinking small amounts, about  cup (120 mL) every 5-10 minutes. Slowly increase how much you drink until you have taken in the amount recommended by your health care provider. Drink enough clear fluids to keep your urine pale yellow. If you were told to drink an ORS, finish it first, then start slowly drinking other clear fluids. Drink fluids such as: Water. This includes sparkling and flavored water. Drinking only water can lead to having too little sodium in your body (hyponatremia). Follow the advice of your health care provider. Water from ice chips you suck on. Fruit juice with water added to it(diluted). Sports drinks. Hot or  cold herbal teas. Broth-based soups. Coffee. Milk or milk products. Food Follow instructions from your health care provider about what to eat while you rehydrate. Your health care provider may recommend that you slowly begin eating regular foods in small amounts. Eat foods that contain a healthy balance of electrolytes, such as bananas, oranges, potatoes, tomatoes, and spinach. Avoid foods that are greasy or contain a lot of sugar. In some cases, you may get nutrition through a feeding tube that is passed through your nose and into your stomach (nasogastric tube, or NG tube). This may be done if you have uncontrolled vomiting or diarrhea. Drinks to avoid  Certain drinks may make dehydration worse. While you rehydrate, avoid drinking alcohol. How to tell if you are recovering from dehydration You may be getting better if: You are urinating more often than before you started rehydrating. Your urine is pale yellow. Your energy level improves. You vomit less often. You have diarrhea less often. Your appetite improves or returns to normal. You feel less dizzy or light-headed. Your skin tone and color start to look more normal. Follow these instructions at home: Take over-the-counter and prescription medicines only as told by your health care provider. Do not take sodium tablets. Doing this can  lead to having too much sodium in your body (hypernatremia). Contact a health care provider if: You continue to have symptoms of mild or moderate dehydration, such as: Thirst. Dry lips. Slightly dry mouth. Dizziness. Dark urine or less urine than usual. Muscle cramps. You continue to vomit or have diarrhea. Get help right away if: You have symptoms of dehydration that get worse. You have a fever. You have a severe headache. You have been vomiting and have problems, such as: Your vomiting gets worse. Your vomit includes blood or green matter (bile). You cannot eat or drink without  vomiting. You have problems with urination or bowel movements, such as: Diarrhea that gets worse. Blood in your stool (feces). This may cause stool to look black and tarry. Not urinating, or urinating only a small amount of very dark urine, within 6-8 hours. You have trouble breathing. You have symptoms that get worse with treatment. These symptoms may be an emergency. Get help right away. Call 911. Do not wait to see if the symptoms will go away. Do not drive yourself to the hospital. This information is not intended to replace advice given to you by your health care provider. Make sure you discuss any questions you have with your health care provider. Document Revised: 12/24/2021 Document Reviewed: 12/22/2021 Elsevier Patient Education  2023 [employer].

## 2022-12-03 DIAGNOSIS — E113312 Type 2 diabetes mellitus with moderate nonproliferative diabetic retinopathy with macular edema, left eye: Secondary | ICD-10-CM | POA: Diagnosis not present

## 2022-12-03 DIAGNOSIS — H0011 Chalazion right upper eyelid: Secondary | ICD-10-CM | POA: Diagnosis not present

## 2022-12-03 DIAGNOSIS — G7000 Myasthenia gravis without (acute) exacerbation: Secondary | ICD-10-CM | POA: Diagnosis not present

## 2022-12-03 DIAGNOSIS — H469 Unspecified optic neuritis: Secondary | ICD-10-CM | POA: Diagnosis not present

## 2022-12-03 DIAGNOSIS — H4311 Vitreous hemorrhage, right eye: Secondary | ICD-10-CM | POA: Diagnosis not present

## 2022-12-03 DIAGNOSIS — H3500 Unspecified background retinopathy: Secondary | ICD-10-CM | POA: Diagnosis not present

## 2022-12-11 ENCOUNTER — Telehealth: Payer: Self-pay | Admitting: Hematology

## 2022-12-11 NOTE — Telephone Encounter (Signed)
Patient called to verify if she is supposed to see Dr Mosetta Putt at every treatment or every other; reaching out to nurse and will call patient back.

## 2022-12-15 NOTE — Assessment & Plan Note (Signed)
-  She is on Rituxan every 6 months, CellCept and prednisone.  will continue during chemo  -I previously discussed with her neurologist  

## 2022-12-15 NOTE — Assessment & Plan Note (Signed)
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  I reduced her Abraxane dose to 60 mg/m and continue. She has been tolerating this well

## 2022-12-15 NOTE — Assessment & Plan Note (Signed)
-  secondary to chemo, her DM may contribute also -I called in gabapentin, she has not started yet.  She will continue cryotherapy during chemo, and use warm gloves when the blanket at night. -neuropathy is minimum now

## 2022-12-16 ENCOUNTER — Other Ambulatory Visit: Payer: Self-pay

## 2022-12-16 ENCOUNTER — Inpatient Hospital Stay: Payer: Medicare Other

## 2022-12-16 ENCOUNTER — Inpatient Hospital Stay (HOSPITAL_BASED_OUTPATIENT_CLINIC_OR_DEPARTMENT_OTHER): Payer: Medicare Other | Admitting: Hematology

## 2022-12-16 ENCOUNTER — Encounter: Payer: Self-pay | Admitting: Hematology

## 2022-12-16 VITALS — BP 163/63 | HR 91 | Temp 98.2°F | Resp 18 | Ht 63.0 in | Wt 196.5 lb

## 2022-12-16 DIAGNOSIS — C50412 Malignant neoplasm of upper-outer quadrant of left female breast: Secondary | ICD-10-CM | POA: Diagnosis not present

## 2022-12-16 DIAGNOSIS — Z170 Estrogen receptor positive status [ER+]: Secondary | ICD-10-CM

## 2022-12-16 DIAGNOSIS — Z5111 Encounter for antineoplastic chemotherapy: Secondary | ICD-10-CM | POA: Diagnosis not present

## 2022-12-16 DIAGNOSIS — G620 Drug-induced polyneuropathy: Secondary | ICD-10-CM | POA: Diagnosis not present

## 2022-12-16 DIAGNOSIS — Z79633 Long term (current) use of mitotic inhibitor: Secondary | ICD-10-CM | POA: Diagnosis not present

## 2022-12-16 DIAGNOSIS — Z7984 Long term (current) use of oral hypoglycemic drugs: Secondary | ICD-10-CM | POA: Diagnosis not present

## 2022-12-16 DIAGNOSIS — Z79899 Other long term (current) drug therapy: Secondary | ICD-10-CM | POA: Diagnosis not present

## 2022-12-16 DIAGNOSIS — Z7952 Long term (current) use of systemic steroids: Secondary | ICD-10-CM | POA: Diagnosis not present

## 2022-12-16 DIAGNOSIS — Z95828 Presence of other vascular implants and grafts: Secondary | ICD-10-CM

## 2022-12-16 DIAGNOSIS — Z7982 Long term (current) use of aspirin: Secondary | ICD-10-CM | POA: Diagnosis not present

## 2022-12-16 DIAGNOSIS — G7000 Myasthenia gravis without (acute) exacerbation: Secondary | ICD-10-CM | POA: Diagnosis not present

## 2022-12-16 DIAGNOSIS — Z9049 Acquired absence of other specified parts of digestive tract: Secondary | ICD-10-CM | POA: Diagnosis not present

## 2022-12-16 DIAGNOSIS — Z794 Long term (current) use of insulin: Secondary | ICD-10-CM | POA: Diagnosis not present

## 2022-12-16 DIAGNOSIS — E1142 Type 2 diabetes mellitus with diabetic polyneuropathy: Secondary | ICD-10-CM | POA: Diagnosis not present

## 2022-12-16 LAB — CBC WITH DIFFERENTIAL (CANCER CENTER ONLY)
Abs Immature Granulocytes: 0.02 10*3/uL (ref 0.00–0.07)
Basophils Absolute: 0 10*3/uL (ref 0.0–0.1)
Basophils Relative: 1 %
Eosinophils Absolute: 0.1 10*3/uL (ref 0.0–0.5)
Eosinophils Relative: 2 %
HCT: 27.9 % — ABNORMAL LOW (ref 36.0–46.0)
Hemoglobin: 9.1 g/dL — ABNORMAL LOW (ref 12.0–15.0)
Immature Granulocytes: 0 %
Lymphocytes Relative: 24 %
Lymphs Abs: 1.3 10*3/uL (ref 0.7–4.0)
MCH: 31.1 pg (ref 26.0–34.0)
MCHC: 32.6 g/dL (ref 30.0–36.0)
MCV: 95.2 fL (ref 80.0–100.0)
Monocytes Absolute: 0.6 10*3/uL (ref 0.1–1.0)
Monocytes Relative: 11 %
Neutro Abs: 3.4 10*3/uL (ref 1.7–7.7)
Neutrophils Relative %: 62 %
Platelet Count: 235 10*3/uL (ref 150–400)
RBC: 2.93 MIL/uL — ABNORMAL LOW (ref 3.87–5.11)
RDW: 14 % (ref 11.5–15.5)
WBC Count: 5.5 10*3/uL (ref 4.0–10.5)
nRBC: 0 % (ref 0.0–0.2)

## 2022-12-16 LAB — CMP (CANCER CENTER ONLY)
ALT: 40 U/L (ref 0–44)
AST: 44 U/L — ABNORMAL HIGH (ref 15–41)
Albumin: 3.6 g/dL (ref 3.5–5.0)
Alkaline Phosphatase: 166 U/L — ABNORMAL HIGH (ref 38–126)
Anion gap: 6 (ref 5–15)
BUN: 20 mg/dL (ref 8–23)
CO2: 21 mmol/L — ABNORMAL LOW (ref 22–32)
Calcium: 9.4 mg/dL (ref 8.9–10.3)
Chloride: 113 mmol/L — ABNORMAL HIGH (ref 98–111)
Creatinine: 0.93 mg/dL (ref 0.44–1.00)
GFR, Estimated: >60 mL/min (ref >60)
Glucose, Bld: 185 mg/dL — ABNORMAL HIGH (ref 70–99)
Potassium: 3.4 mmol/L — ABNORMAL LOW (ref 3.5–5.1)
Sodium: 140 mmol/L (ref 135–145)
Total Bilirubin: 0.2 mg/dL — ABNORMAL LOW (ref 0.3–1.2)
Total Protein: 8.5 g/dL — ABNORMAL HIGH (ref 6.5–8.1)

## 2022-12-16 MED ORDER — SODIUM CHLORIDE 0.9% FLUSH
10.0000 mL | Freq: Once | INTRAVENOUS | Status: AC
  Administered 2022-12-16: 10 mL

## 2022-12-16 MED ORDER — SODIUM CHLORIDE 0.9 % IV SOLN: Freq: Once | INTRAVENOUS | Status: AC

## 2022-12-16 MED ORDER — HEPARIN SOD (PORK) LOCK FLUSH 100 UNIT/ML IV SOLN
500.0000 [IU] | Freq: Once | INTRAVENOUS | Status: AC | PRN
  Administered 2022-12-16: 500 [IU]

## 2022-12-16 MED ORDER — PACLITAXEL PROTEIN-BOUND CHEMO INJECTION 100 MG
70.0000 mg/m2 | Freq: Once | INTRAVENOUS | Status: AC
  Administered 2022-12-16: 150 mg via INTRAVENOUS
  Filled 2022-12-16: qty 30

## 2022-12-16 MED ORDER — SODIUM CHLORIDE 0.9% FLUSH
10.0000 mL | INTRAVENOUS | Status: DC | PRN
  Administered 2022-12-16: 10 mL

## 2022-12-16 MED ORDER — PROCHLORPERAZINE MALEATE 10 MG PO TABS
10.0000 mg | ORAL_TABLET | Freq: Once | ORAL | Status: AC
  Administered 2022-12-16: 10 mg via ORAL
  Filled 2022-12-16: qty 1

## 2022-12-16 NOTE — Patient Instructions (Signed)
[facility]  Discharge Instructions: Thank you for choosing [facility] to provide your oncology and hematology care.   If you have a lab appointment with the Cancer Center, please go directly to the Cancer Center and check in at the registration area.   Wear comfortable clothing and clothing appropriate for easy access to any Portacath or PICC line.   We strive to give you quality time with your provider. You may need to reschedule your appointment if you arrive late (15 or more minutes).  Arriving late affects you and other patients whose appointments are after yours.  Also, if you miss three or more appointments without notifying the office, you may be dismissed from the clinic at the provider's discretion.      For prescription refill requests, have your pharmacy contact our office and allow 72 hours for refills to be completed.    Today you received the following chemotherapy and/or immunotherapy agents: Abraxane      To help prevent nausea and vomiting after your treatment, we encourage you to take your nausea medication as directed.  BELOW ARE SYMPTOMS THAT SHOULD BE REPORTED IMMEDIATELY: *FEVER GREATER THAN 100.4 F (38 C) OR HIGHER *CHILLS OR SWEATING *NAUSEA AND VOMITING THAT IS NOT CONTROLLED WITH YOUR NAUSEA MEDICATION *UNUSUAL SHORTNESS OF BREATH *UNUSUAL BRUISING OR BLEEDING *URINARY PROBLEMS (pain or burning when urinating, or frequent urination) *BOWEL PROBLEMS (unusual diarrhea, constipation, pain near the anus) TENDERNESS IN MOUTH AND THROAT WITH OR WITHOUT PRESENCE OF ULCERS (sore throat, sores in mouth, or a toothache) UNUSUAL RASH, SWELLING OR PAIN  UNUSUAL VAGINAL DISCHARGE OR ITCHING   Items with * indicate a potential emergency and should be followed up as soon as possible or go to the Emergency Department if any problems should occur.  Please show the CHEMOTHERAPY ALERT CARD or IMMUNOTHERAPY ALERT CARD at  check-in to the Emergency Department and triage nurse.  Should you have questions after your visit or need to cancel or reschedule your appointment, please contact [facility]  Dept: [phone]  and follow the prompts.  Office hours are 8:00 a.m. to 4:30 p.m. Monday - Friday. Please note that voicemails left after 4:00 p.m. may not be returned until the following business day.  We are closed weekends and major holidays. You have access to a nurse at all times for urgent questions. Please call the main number to the clinic Dept: [phone] and follow the prompts.   For any non-urgent questions, you may also contact your provider using MyChart. We now offer e-Visits for anyone 18 and older to request care online for non-urgent symptoms. For details visit [email or web address].   Also download the MyChart app! Go to the app store, search "MyChart", open the app, select [facility], and log in with your MyChart username and password.   

## 2022-12-16 NOTE — Progress Notes (Signed)
[facility]   [phone]) [phone] Fax:([phone]) [phone]   Clinic Follow up Note   Patient Care Team: Irena Reichmann, DO as PCP - General (Family Medicine) Micki Riley, MD as Consulting Physician (Neurology) Ihor Austin, NP as Nurse Practitioner (Neurology) Donnelly Angelica, RN as Oncology Nurse Navigator Pershing Proud, RN as Oncology Nurse Navigator Abigail Miyamoto, MD as Consulting Physician (General Surgery) Malachy Mood, MD as Consulting Physician (Hematology) Dorothy Puffer, MD as Consulting Physician (Radiation Oncology) Dianne Dun, MD as Consulting Physician (Rheumatology)  Date of Service:  12/16/2022  CHIEF COMPLAINT: f/u of  left breast cancer   CURRENT THERAPY:   Adjuvant chemotherapy with Abraxane weekly (2 weeks on and one week off) for 12 treatments, starting 10/21/22   ASSESSMENT:  [name] is a 72 y.o. 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  I reduced her Abraxane dose to 60 mg/m and continue. She has been tolerating this well     Myasthenia gravis (HCC) -She is on Rituxan every 6 months, CellCept and prednisone.  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  She will continue cryotherapy during chemo, and use warm gloves when the blanket at  night. -neuropathy is minimum now      PLAN: -lab reviewed hg 9.1 -proceed with C3 Abraxane 70 mg/m2, slightly increased dose given her good tolerance  -CMP pending -will increase to full dose in 2 weeks if pt tolerates well. -lab/flush and f/u  on 12/23/2022   SUMMARY OF ONCOLOGIC HISTORY: Oncology History Overview Note   Cancer Staging  Malignant neoplasm of upper-outer quadrant of left breast in female, estrogen receptor positive (HCC) Staging form: Breast, AJCC 8th Edition - Clinical: Stage IB (cT1b, cN0, cM0, G3, ER+, PR-, HER2-) - Signed by Ronny Bacon, PA-C on 08/10/2022 Stage prefix: Initial diagnosis Method of lymph node assessment: Clinical Histologic grading system: 3 grade system - Pathologic stage from 09/02/2022: Stage IB (pT1b, pN0, cM0, G2, ER-, PR-, HER2-) - Signed by Malachy Mood, MD on 09/23/2022 Stage prefix: Initial diagnosis Histologic grading system: 3 grade system Residual tumor (R): R0 - None     Malignant neoplasm of upper-outer quadrant of left breast in female, estrogen receptor positive  08/10/2022 Initial Diagnosis   Malignant neoplasm of upper-outer quadrant of left female breast (HCC)   08/10/2022 Cancer Staging   Staging form: Breast, AJCC 8th Edition - Clinical: Stage IB (cT1b, cN0, cM0, G3, ER+, PR-, HER2-) - Signed by Ronny Bacon, PA-C on 08/10/2022 Stage prefix: Initial diagnosis Method of lymph node assessment: Clinical Histologic grading system: 3 grade system   09/02/2022 Cancer Staging   Staging form: Breast, AJCC 8th Edition - Pathologic stage from 09/02/2022: Stage IB (pT1b, pN0, cM0, G2, ER-, PR-, HER2-) - Signed by Malachy Mood, MD on 09/23/2022 Stage prefix: Initial diagnosis Histologic grading system: 3 grade system  Residual tumor (R): R0 - None   10/21/2022 -  Chemotherapy   Patient is on Treatment Plan : BREAST PACLitaxel-Albumin (Abraxane) (100) D1,8,15 q28d        INTERVAL HISTORY:  [name] is  here for a follow up of  left breast cancer . She was last seen by me on 12/02/2022. She presents to the clinic accompanied by sister. Pt report that she has some discoloration on the left toe. Pt denies having numbness and tingling in hands and feet.        All other systems were reviewed with the patient and are negative.  MEDICAL HISTORY:  Past Medical History:  Diagnosis Date   Arthritis    "all over my body" (03/13/2013)   Asthma    Asymptomatic carotid artery stenosis, bilateral 10/08/2018   Breast cancer    GERD (gastroesophageal reflux disease)    H/O hiatal hernia    Headache    pt states she has had headaches for about 6 months "off and on"   Heart murmur    pt had echocardiogram on 09/17/21   Hypercholesterolemia 10/08/2018   Hypertension    Hypothyroidism    Myasthenia gravis    "in my eyes; diagnsosed > 7 yr ago" (03/13/2013)   Sleep apnea    on CPAP   Stroke 2021   Thyroid carcinoma    Type II diabetes mellitus     SURGICAL HISTORY: Past Surgical History:  Procedure Laterality Date   ABDOMINAL HYSTERECTOMY     ANTERIOR CERVICAL DECOMP/DISCECTOMY FUSION     "I've had severa ORs; always went in from the front" (03/13/2013)   APPENDECTOMY     BREAST LUMPECTOMY WITH RADIOACTIVE SEED AND SENTINEL LYMPH NODE BIOPSY Left 09/02/2022   Procedure: LEFT BREAST LUMPECTOMY WITH RADIOACTIVE SEED AND SENTINEL LYMPH NODE BIOPSY;  Surgeon: Abigail Miyamoto, MD;  Location: [facility] OR;  Service: General;  Laterality: Left;   CARDIAC CATHETERIZATION     "several" (03/13/2013)   CARPAL TUNNEL RELEASE Right    CATARACT EXTRACTION W/ INTRAOCULAR LENS  IMPLANT, BILATERAL Bilateral    CHOLECYSTECTOMY     KNEE ARTHROSCOPY Left    SHOULDER ARTHROSCOPY W/ ROTATOR CUFF REPAIR Left    TONSILLECTOMY     TOTAL THYROIDECTOMY     TRANSCAROTID ARTERY REVASCULARIZATION  Left 09/24/2021   Procedure: LEFT TRANSCAROTID ARTERY REVASCULARIZATION;  Surgeon: Leonie Douglas, MD;  Location: [facility] OR;   Service: Vascular;  Laterality: Left;   TRANSCAROTID ARTERY REVASCULARIZATION  Right 10/23/2021   Procedure: Right Transcarotid Artery Revascularization;  Surgeon: Leonie Douglas, MD;  Location: [facility] OR;  Service: Vascular;  Laterality: Right;   ULTRASOUND GUIDANCE FOR VASCULAR ACCESS Right 09/24/2021   Procedure: ULTRASOUND GUIDANCE FOR VASCULAR ACCESS;  Surgeon: Leonie Douglas, MD;  [facility];  Service: Vascular;  Laterality: Right;   ULTRASOUND GUIDANCE FOR VASCULAR ACCESS Right 10/23/2021   Procedure: ULTRASOUND GUIDANCE FOR VASCULAR ACCESS, LEFT FEMORAL VEIN;  Surgeon: Leonie Douglas, MD;  Location: [facility] OR;  Service: Vascular;  Laterality: Right;    I have reviewed the social history and family history with the patient and they are unchanged from previous note.  ALLERGIES:  is allergic to contrast media [iodinated contrast media], fluorescein, iodine, molds & smuts, shellfish-derived products, pholcodine, azithromycin, codeine, metformin hcl er, oxycodone, tramadol hcl, and other.  MEDICATIONS:  Current Outpatient Medications  Medication Sig Dispense Refill   acetaminophen (TYLENOL) 500 MG tablet Take 500 mg by mouth every 6 (six) hours  as needed for moderate pain.     aspirin EC 81 MG EC tablet Take 1 tablet (81 mg total) by mouth daily.     Biotin 5000 MCG CAPS Take 5,000 mcg by mouth daily with breakfast.      cycloSPORINE (RESTASIS) 0.05 % ophthalmic emulsion Place 1 drop into both eyes 2 (two) times daily. 0.4 mL 0   diclofenac Sodium (VOLTAREN) 1 % GEL Apply 2 g topically 4 (four) times daily. (Patient taking differently: Apply 1 application  topically daily as needed (pain).) 2 g 0   diphenhydrAMINE 25 mg in sodium chloride 0.9 % 50 mL Inject 25 mg into the vein See admin instructions. Administer 25 mg intravenously at start of Gamunex infusion, then administer 25 mg during infusion     diphenoxylate-atropine (LOMOTIL) 2.5-0.025 MG tablet Take 1-2 tablets by mouth 4 (four) times  daily as needed for diarrhea or loose stools. 30 tablet 2   ELDERBERRY PO Take 1 tablet by mouth daily.     empagliflozin (JARDIANCE) 25 MG TABS tablet Take 25 mg by mouth daily.     esomeprazole (NEXIUM) 40 MG capsule Take 1 capsule (40 mg total) by mouth 2 (two) times daily. 60 capsule 0   Evolocumab with Infusor (REPATHA PUSHTRONEX SYSTEM) 420 MG/3.5ML SOCT Inject 420 mg into the skin every 30 (thirty) days.     Exenatide ER (BYDUREON BCISE) 2 MG/0.85ML AUIJ Inject 2 mg into the skin every Monday.     ezetimibe (ZETIA) 10 MG tablet TAKE 1 TABLET BY MOUTH EVERY DAY 90 tablet 1   furosemide (LASIX) 40 MG tablet Take 40 mg by mouth daily as needed for edema.     gabapentin (NEURONTIN) 100 MG capsule Take 1 capsule (100 mg total) by mouth at bedtime. Increase to 2 cap at night in 2 weeks if needed 30 capsule 0   Immune Globulin, Human, 40 GM/400ML SOLN Inject into the vein every 6 (six) weeks. Infusions are administered at home by home health nurse - last infusion 09/08/21 and 09/09/21; next infusion due 10/19/21 and 10/20/21     insulin degludec (TRESIBA FLEXTOUCH) 100 UNIT/ML FlexTouch Pen Inject 15 Units into the skin in the morning.     Insulin Disposable Pump (OMNIPOD DASH 5 PACK PODS) MISC Use with Humalog     insulin lispro (HUMALOG) 100 UNIT/ML injection Inject into the skin See admin instructions. Inject subcutaneously 3 (three) times daily with meals With meals through the Omnipod Dash Pump every 72 hours as directed     levothyroxine (SYNTHROID) 175 MCG tablet Take 175 mcg by mouth daily before breakfast.     lidocaine-prilocaine (EMLA) cream Apply to affected area once 30 g 3   loperamide (IMODIUM A-D) 2 MG tablet Take 2 mg by mouth 4 (four) times daily as needed for diarrhea or loose stools.     loratadine (CLARITIN) 10 MG tablet Take 1 tablet (10 mg total) by mouth at bedtime. 30 tablet 0   meclizine (ANTIVERT) 25 MG tablet Take 25 mg by mouth 3 (three) times daily as needed for dizziness  or nausea (or migraine-related nausea).     montelukast (SINGULAIR) 10 MG tablet Take 1 tablet (10 mg total) by mouth every morning. 30 tablet 0   mycophenolate (CELLCEPT) 500 MG tablet Take 1,000 mg by mouth 2 (two) times daily. For myasthenia gravis     olmesartan (BENICAR) 20 MG tablet Take 1 tablet (20 mg total) by mouth daily. 30 tablet 0   ondansetron (ZOFRAN)  8 MG tablet Take 1 tablet (8 mg total) by mouth every 8 (eight) hours as needed for nausea or vomiting. 30 tablet 1   predniSONE (DELTASONE) 2.5 MG tablet Take 7.5 mg by mouth every morning. For myasthenia gravis     PRESCRIPTION MEDICATION Pt uses a cpap nightly     PROAIR HFA 108 (90 BASE) MCG/ACT inhaler Inhale 2 puffs into the lungs every 6 (six) hours as needed for wheezing or shortness of breath.      prochlorperazine (COMPAZINE) 10 MG tablet Take 1 tablet (10 mg total) by mouth every 6 (six) hours as needed for nausea or vomiting. 30 tablet 1   riTUXimab (RITUXAN) 500 MG/50ML injection Inject into the vein every 6 (six) months.     rosuvastatin (CRESTOR) 40 MG tablet Take 1 tablet (40 mg total) by mouth daily. 90 tablet 3   topiramate (TOPAMAX) 50 MG tablet Take 1 tablet (50 mg total) by mouth at bedtime. 90 tablet 3   traMADol (ULTRAM) 50 MG tablet Take 1 tablet (50 mg total) by mouth every 6 (six) hours as needed for moderate pain. 25 tablet 0   vitamin B-12 (CYANOCOBALAMIN) 1000 MCG tablet Take 1,000 mcg by mouth daily.     Vitamin D, Ergocalciferol, (DRISDOL) 50000 UNITS CAPS capsule Take 50,000 Units by mouth every Monday.      No current facility-administered medications for this visit.   Facility-Administered Medications Ordered in Other Visits  Medication Dose Route Frequency Provider Last Rate Last Admin   heparin lock flush 100 unit/mL  500 Units Intracatheter Once PRN Malachy Mood, MD       sodium chloride flush (NS) 0.9 % injection 10 mL  10 mL Intracatheter PRN Malachy Mood, MD        PHYSICAL EXAMINATION: ECOG  PERFORMANCE STATUS: 2 - Symptomatic, <50% confined to bed  Vitals:   12/16/22 0822  BP: (!) 163/63  Pulse: 91  Resp: 18  Temp: 98.2 F (36.8 C)  SpO2: 99%   Wt Readings from Last 3 Encounters:  12/16/22 196 lb 8 oz (89.1 kg)  12/02/22 197 lb 8 oz (89.6 kg)  11/25/22 198 lb 12.8 oz (90.2 kg)     GENERAL:alert, no distress and comfortable SKIN: skin color normal, no rashes or significant lesions EYES: normal, Conjunctiva are pink and non-injected, sclera clear  NEURO: alert & oriented x 3 with fluent speech   LABORATORY DATA:  I have reviewed the data as listed    Latest Ref Rng & Units 12/16/2022    8:03 AM 12/02/2022    9:16 AM 11/25/2022    8:26 AM  CBC  WBC 4.0 - 10.5 K/uL 5.5  6.9  5.3   Hemoglobin 12.0 - 15.0 g/dL 9.1  9.0  8.9   Hematocrit 36.0 - 46.0 % 27.9  27.2  26.7   Platelets 150 - 400 K/uL 235  244  252         Latest Ref Rng & Units 12/16/2022    8:03 AM 12/02/2022    9:16 AM 11/25/2022    8:26 AM  CMP  Glucose 70 - 99 mg/dL 528  413  244   BUN 8 - 23 mg/dL 20  28  27    Creatinine 0.44 - 1.00 mg/dL 0.10  2.72  5.36   Sodium 135 - 145 mmol/L 140  141  141   Potassium 3.5 - 5.1 mmol/L 3.4  3.7  3.7   Chloride 98 - 111 mmol/L 113  113  113   CO2 22 - 32 mmol/L 21  20  22    Calcium 8.9 - 10.3 mg/dL 9.4  9.2  9.2   Total Protein 6.5 - 8.1 g/dL 8.5  6.9  7.0   Total Bilirubin 0.3 - 1.2 mg/dL 0.2  0.4  0.2   Alkaline Phos 38 - 126 U/L 166  160  147   AST 15 - 41 U/L 44  27  32   ALT 0 - 44 U/L 40  28  28       RADIOGRAPHIC STUDIES: I have personally reviewed the radiological images as listed and agreed with the findings in the report. No results found.    No orders of the defined types were placed in this encounter.  All questions were answered. The patient knows to call the clinic with any problems, questions or concerns. No barriers to learning was detected. The total time spent in the appointment was 30 minutes.     Malachy Mood, MD 12/16/2022    Carolin Coy, CMA, am acting as scribe for Malachy Mood, MD.   I have reviewed the above documentation for accuracy and completeness, and I agree with the above.

## 2022-12-21 ENCOUNTER — Encounter: Payer: Self-pay | Admitting: Hematology

## 2022-12-21 ENCOUNTER — Other Ambulatory Visit: Payer: Self-pay

## 2022-12-21 ENCOUNTER — Telehealth: Payer: Self-pay

## 2022-12-21 NOTE — Telephone Encounter (Signed)
Pt called requesting if she could get financial assistance with her medical expenses here at [facility].  Pt stated she's received a bill for $1K and she does not have the funds to pay these bills. Informed pt that I will speak with our Revenue Cycle Team, Social Worker (Ronda Fairly), and our Financial Assistance Team to see if the pt qualifies for any grants.  Notified all teams about pt's financial burden.  Pt also inquired about a prosthetic wig.  Pt stated her hair is coming out/ thinning and she would like a prosthetic wig if possible.  Instructed pt to contact her insurance to see if its cover under her insurance plan.  If so, instructed pt to find out how much will her plan cover.  Informed pt that this RN will request a written prescription for a prosthetic wig from Dr. Mosetta Putt.  Pt verbalized understanding and had no further questions or concerns.

## 2022-12-21 NOTE — Progress Notes (Signed)
Called patient referred by RN whom has financial concerns.  Introduced myself as Dance movement psychotherapist and to offer available resources. Discussed one-time $1000 Marketing executive to assist with personal expenses while going through treatment. Advised what is needed to apply. She will bring on 12/23/22 at next appointment and provide to registration. She will then be given grant paperwork to complete and call me at earliest convenience. Briefly discussed gift cards and how they are distributed. She verbalized understanding. Gave her my direct name and number should she have any additional questions. My card will be given on 12/23/22 with the grant packet as well.  Advised patient she may apply for Medicaid as far as medical bills and provided her with the number to [facility] DSS. This was a concern for her as well.

## 2022-12-22 ENCOUNTER — Other Ambulatory Visit: Payer: Self-pay

## 2022-12-22 ENCOUNTER — Inpatient Hospital Stay: Payer: Medicare Other | Admitting: Licensed Clinical Social Worker

## 2022-12-22 DIAGNOSIS — C50412 Malignant neoplasm of upper-outer quadrant of left female breast: Secondary | ICD-10-CM

## 2022-12-22 NOTE — Progress Notes (Signed)
[facility]   [phone] Fax:([phone]   Clinic Follow up Note   Patient Care Team: Irena Reichmann, DO as PCP - General (Family Medicine) Micki Riley, MD as Consulting Physician (Neurology) Ihor Austin, NP as Nurse Practitioner (Neurology) Donnelly Angelica, RN as Oncology Nurse Navigator Pershing Proud, RN as Oncology Nurse Navigator Abigail Miyamoto, MD as Consulting Physician (General Surgery) Malachy Mood, MD as Consulting Physician (Hematology) Dorothy Puffer, MD as Consulting Physician (Radiation Oncology) Dianne Dun, MD as Consulting Physician (Rheumatology)  Date of Service:  12/30/2022  CHIEF COMPLAINT: f/u of  left breast cancer   CURRENT THERAPY:  Adjuvant chemotherapy with Abraxane weekly (2 weeks on and one week off) for 12 treatments, starting 10/21/22    ASSESSMENT: [name] is a 72 y.o. 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  I reduced her Abraxane dose to 60 mg/m and continue. She has been tolerating this well  -Given her excellent tolerance, I slightly increased Abraxane to 70 mg/m 2 weeks ago, she tolerated well with slightly worse fatigue -Will increase Abraxane to full dose 80 mg/m today, and continue as she tolerates.  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  She will continue cryotherapy during chemo, and  use warm gloves when the blanket at night. -neuropathy is minimum now     Myasthenia gravis (HCC) -She is on Rituxan every 6 months, CellCept and prednisone.  will continue during chemo  -I previously discussed with her neurologist      PLAN: -lab reviewed -proceed with  D8,C3 abraxane at full dose given her good tolerance -lab/flush and Abraxane 5/22, f/u in 2 weeks    SUMMARY OF ONCOLOGIC HISTORY: Oncology History Overview Note   Cancer Staging  Malignant neoplasm of upper-outer quadrant of left breast in female, estrogen receptor positive (HCC) Staging form: Breast, AJCC 8th Edition - Clinical: Stage IB (cT1b, cN0, cM0, G3, ER+, PR-, HER2-) - Signed by Ronny Bacon, PA-C on 08/10/2022 Stage prefix: Initial diagnosis Method of lymph node assessment: Clinical Histologic grading system: 3 grade system - Pathologic stage from 09/02/2022: Stage IB (pT1b, pN0, cM0, G2, ER-, PR-, HER2-) - Signed by Malachy Mood, MD on 09/23/2022 Stage prefix: Initial diagnosis Histologic grading system: 3 grade system Residual tumor (R): R0 - None     Malignant neoplasm of upper-outer quadrant of left breast in female, estrogen receptor positive (HCC)  08/10/2022 Initial Diagnosis   Malignant neoplasm of upper-outer quadrant of left female breast (HCC)   08/10/2022 Cancer Staging   Staging form: Breast, AJCC 8th Edition - Clinical: Stage IB (cT1b, cN0, cM0, G3, ER+, PR-, HER2-) - Signed by Ronny Bacon, PA-C on 08/10/2022 Stage prefix: Initial diagnosis Method of lymph node assessment: Clinical Histologic grading system: 3 grade system   09/02/2022 Cancer Staging   Staging form: Breast, AJCC 8th Edition - Pathologic stage from 09/02/2022: Stage IB (pT1b, pN0, cM0, G2, ER-, PR-, HER2-) -  Signed by Malachy Mood, MD on 09/23/2022 Stage prefix: Initial diagnosis Histologic grading system: 3 grade system Residual tumor (R): R0 - None   10/21/2022 -  Chemotherapy   Patient is on  Treatment Plan : BREAST PACLitaxel-Albumin (Abraxane) (100) D1,8,15 q28d        INTERVAL HISTORY:  [name] is here for a follow up of  left breast cancer . She was last seen by me on 12/16/2022. She presents to the clinic  Accompanied by sister. Pt state that last treatment went well.Pt state that she has some diarrhea and she uses Imodium. Pt state that she has fatigue the day after receiving treatment.    All other systems were reviewed with the patient and are negative.  MEDICAL HISTORY:  Past Medical History:  Diagnosis Date   Arthritis    "all over my body" (03/13/2013)   Asthma    Asymptomatic carotid artery stenosis, bilateral 10/08/2018   Breast cancer (HCC)    GERD (gastroesophageal reflux disease)    H/O hiatal hernia    Headache    pt states she has had headaches for about 6 months "off and on"   Heart murmur    pt had echocardiogram on 09/17/21   Hypercholesterolemia 10/08/2018   Hypertension    Hypothyroidism    Myasthenia gravis (HCC)    "in my eyes; diagnsosed > 7 yr ago" (03/13/2013)   Sleep apnea    on CPAP   Stroke (HCC) 2021   Thyroid carcinoma (HCC)    Type II diabetes mellitus (HCC)     SURGICAL HISTORY: Past Surgical History:  Procedure Laterality Date   ABDOMINAL HYSTERECTOMY     ANTERIOR CERVICAL DECOMP/DISCECTOMY FUSION     "I've had severa ORs; always went in from the front" (03/13/2013)   APPENDECTOMY     BREAST LUMPECTOMY WITH RADIOACTIVE SEED AND SENTINEL LYMPH NODE BIOPSY Left 09/02/2022   Procedure: LEFT BREAST LUMPECTOMY WITH RADIOACTIVE SEED AND SENTINEL LYMPH NODE BIOPSY;  Surgeon: Abigail Miyamoto, MD;  Location: [facility] OR;  Service: General;  Laterality: Left;   CARDIAC CATHETERIZATION     "several" (03/13/2013)   CARPAL TUNNEL RELEASE Right    CATARACT EXTRACTION W/ INTRAOCULAR LENS  IMPLANT, BILATERAL Bilateral    CHOLECYSTECTOMY     KNEE ARTHROSCOPY Left    SHOULDER ARTHROSCOPY W/ ROTATOR CUFF REPAIR Left    TONSILLECTOMY      TOTAL THYROIDECTOMY     TRANSCAROTID ARTERY REVASCULARIZATION  Left 09/24/2021   Procedure: LEFT TRANSCAROTID ARTERY REVASCULARIZATION;  Surgeon: Leonie Douglas, MD;  Location: [facility] OR;  Service: Vascular;  Laterality: Left;   TRANSCAROTID ARTERY REVASCULARIZATION  Right 10/23/2021   Procedure: Right Transcarotid Artery Revascularization;  Surgeon: Leonie Douglas, MD;  Location: [facility] OR;  Service: Vascular;  Laterality: Right;   ULTRASOUND GUIDANCE FOR VASCULAR ACCESS Right 09/24/2021   Procedure: ULTRASOUND GUIDANCE FOR VASCULAR ACCESS;  Surgeon: Leonie Douglas, MD;  Location: [facility] OR;  Service: Vascular;  Laterality: Right;   ULTRASOUND GUIDANCE FOR VASCULAR ACCESS Right 10/23/2021   Procedure: ULTRASOUND GUIDANCE FOR VASCULAR ACCESS, LEFT FEMORAL VEIN;  Surgeon: Leonie Douglas, MD;  Location: [facility] OR;  Service: Vascular;  Laterality: Right;    I have reviewed the social history and family history with the patient and they are unchanged from previous note.  ALLERGIES:  is allergic to contrast media [iodinated contrast media], fluorescein, iodine, molds & smuts, shellfish-derived products, pholcodine, azithromycin, codeine, metformin hcl er, oxycodone, tramadol hcl, and other.  MEDICATIONS:  Current Outpatient Medications  Medication Sig Dispense Refill   acetaminophen (TYLENOL) 500 MG tablet Take 500 mg by mouth every 6 (six) hours as needed for moderate pain.     aspirin EC 81 MG EC tablet Take 1 tablet (81 mg total) by mouth daily.     Biotin 5000 MCG CAPS Take 5,000 mcg by mouth daily with breakfast.      cycloSPORINE (RESTASIS) 0.05 % ophthalmic emulsion Place 1 drop into both eyes 2 (two) times daily. 0.4 mL 0   diclofenac Sodium (VOLTAREN) 1 % GEL Apply 2 g topically 4 (four) times daily. (Patient taking differently: Apply 1 application  topically daily as needed (pain).) 2 g 0   diphenhydrAMINE 25 mg in sodium chloride 0.9 % 50 mL Inject 25 mg into the vein See admin instructions.  Administer 25 mg intravenously at start of Gamunex infusion, then administer 25 mg during infusion     diphenoxylate-atropine (LOMOTIL) 2.5-0.025 MG tablet Take 1-2 tablets by mouth 4 (four) times daily as needed for diarrhea or loose stools. 30 tablet 2   ELDERBERRY PO Take 1 tablet by mouth daily.     empagliflozin (JARDIANCE) 25 MG TABS tablet Take 25 mg by mouth daily.     esomeprazole (NEXIUM) 40 MG capsule Take 1 capsule (40 mg total) by mouth 2 (two) times daily. 60 capsule 0   Evolocumab with Infusor (REPATHA PUSHTRONEX SYSTEM) 420 MG/3.5ML SOCT Inject 420 mg into the skin every 30 (thirty) days.     Exenatide ER (BYDUREON BCISE) 2 MG/0.85ML AUIJ Inject 2 mg into the skin every Monday.     ezetimibe (ZETIA) 10 MG tablet TAKE 1 TABLET BY MOUTH EVERY DAY 90 tablet 1   furosemide (LASIX) 40 MG tablet Take 40 mg by mouth daily as needed for edema.     gabapentin (NEURONTIN) 100 MG capsule Take 1 capsule (100 mg total) by mouth at bedtime. Increase to 2 cap at night in 2 weeks if needed 30 capsule 0   Immune Globulin, Human, 40 GM/400ML SOLN Inject into the vein every 6 (six) weeks. Infusions are administered at home by home health nurse - last infusion 09/08/21 and 09/09/21; next infusion due 10/19/21 and 10/20/21     insulin degludec (TRESIBA FLEXTOUCH) 100 UNIT/ML FlexTouch Pen Inject 15 Units into the skin in the morning.     Insulin Disposable Pump (OMNIPOD DASH 5 PACK PODS) MISC Use with Humalog     insulin lispro (HUMALOG) 100 UNIT/ML injection Inject into the skin See admin instructions. Inject subcutaneously 3 (three) times daily with meals With meals through the Omnipod Dash Pump every 72 hours as directed     levothyroxine (SYNTHROID) 175 MCG tablet Take 175 mcg by mouth daily before breakfast.     lidocaine-prilocaine (EMLA) cream Apply to affected area once 30 g 3   loperamide (IMODIUM A-D) 2 MG tablet Take 2 mg by mouth 4 (four) times daily as needed for diarrhea or loose stools.      loratadine (CLARITIN) 10 MG tablet Take 1 tablet (10 mg total) by mouth at bedtime. 30 tablet 0   meclizine (ANTIVERT) 25 MG tablet Take 25 mg by mouth 3 (three) times daily as needed for dizziness or nausea (or migraine-related nausea).     montelukast (SINGULAIR) 10 MG tablet Take 1 tablet (10 mg total) by mouth every morning. 30 tablet 0   mycophenolate (CELLCEPT) 500 MG tablet Take 1,000 mg by mouth 2 (two) times daily. For myasthenia  gravis     olmesartan (BENICAR) 20 MG tablet Take 1 tablet (20 mg total) by mouth daily. 30 tablet 0   ondansetron (ZOFRAN) 8 MG tablet Take 1 tablet (8 mg total) by mouth every 8 (eight) hours as needed for nausea or vomiting. 30 tablet 1   predniSONE (DELTASONE) 2.5 MG tablet Take 7.5 mg by mouth every morning. For myasthenia gravis     PRESCRIPTION MEDICATION Pt uses a cpap nightly     PROAIR HFA 108 (90 BASE) MCG/ACT inhaler Inhale 2 puffs into the lungs every 6 (six) hours as needed for wheezing or shortness of breath.      prochlorperazine (COMPAZINE) 10 MG tablet Take 1 tablet (10 mg total) by mouth every 6 (six) hours as needed for nausea or vomiting. 30 tablet 1   riTUXimab (RITUXAN) 500 MG/50ML injection Inject into the vein every 6 (six) months.     rosuvastatin (CRESTOR) 40 MG tablet Take 1 tablet (40 mg total) by mouth daily. 90 tablet 3   topiramate (TOPAMAX) 50 MG tablet Take 1 tablet (50 mg total) by mouth at bedtime. 90 tablet 3   traMADol (ULTRAM) 50 MG tablet Take 1 tablet (50 mg total) by mouth every 6 (six) hours as needed for moderate pain. 25 tablet 0   vitamin B-12 (CYANOCOBALAMIN) 1000 MCG tablet Take 1,000 mcg by mouth daily.     Vitamin D, Ergocalciferol, (DRISDOL) 50000 UNITS CAPS capsule Take 50,000 Units by mouth every Monday.      No current facility-administered medications for this visit.   Facility-Administered Medications Ordered in Other Visits  Medication Dose Route Frequency Provider Last Rate Last Admin   acetaminophen  (TYLENOL) tablet 650 mg  650 mg Oral NOW Malachy Mood, MD       heparin lock flush 100 unit/mL  500 Units Intracatheter Once PRN Malachy Mood, MD       PACLitaxel-protein bound (ABRAXANE) chemo infusion 150 mg  80 mg/m2 (Treatment Plan Recorded) Intravenous Once Malachy Mood, MD       prochlorperazine (COMPAZINE) tablet 10 mg  10 mg Oral Once Malachy Mood, MD       sodium chloride flush (NS) 0.9 % injection 10 mL  10 mL Intracatheter PRN Malachy Mood, MD        PHYSICAL EXAMINATION: ECOG PERFORMANCE STATUS: 2 - Symptomatic, <50% confined to bed  Vitals:   12/30/22 0833  BP: (!) 162/76  Pulse: 87  Resp: 17  Temp: 97.8 F (36.6 C)  SpO2: 100%   Wt Readings from Last 3 Encounters:  12/30/22 193 lb 9.6 oz (87.8 kg)  12/23/22 191 lb 10 oz (86.9 kg)  12/16/22 196 lb 8 oz (89.1 kg)     GENERAL:alert, no distress and comfortable SKIN: skin color normal, no rashes or significant lesions EYES: normal, Conjunctiva are pink and non-injected, sclera clear  NEURO: alert & oriented x 3 with fluent speech   LABORATORY DATA:  I have reviewed the data as listed    Latest Ref Rng & Units 12/30/2022    8:06 AM 12/23/2022    8:53 AM 12/16/2022    8:03 AM  CBC  WBC 4.0 - 10.5 K/uL 3.9  5.0  5.5   Hemoglobin 12.0 - 15.0 g/dL 9.0  9.1  9.1   Hematocrit 36.0 - 46.0 % 27.4  28.1  27.9   Platelets 150 - 400 K/uL 282  211  235         Latest Ref Rng & Units 12/30/2022  8:06 AM 12/23/2022    8:53 AM 12/16/2022    8:03 AM  CMP  Glucose 70 - 99 mg/dL 409  811  914   BUN 8 - 23 mg/dL 23  24  20    Creatinine 0.44 - 1.00 mg/dL 7.82  9.56  2.13   Sodium 135 - 145 mmol/L 142  139  140   Potassium 3.5 - 5.1 mmol/L 3.4  3.6  3.4   Chloride 98 - 111 mmol/L 113  108  113   CO2 22 - 32 mmol/L 22  24  21    Calcium 8.9 - 10.3 mg/dL 9.1  9.4  9.4   Total Protein 6.5 - 8.1 g/dL 7.4  7.6  8.5   Total Bilirubin 0.3 - 1.2 mg/dL 0.3  0.3  0.2   Alkaline Phos 38 - 126 U/L 149  151  166   AST 15 - 41 U/L 17  24  44   ALT 0 -  44 U/L 18  22  40       RADIOGRAPHIC STUDIES: I have personally reviewed the radiological images as listed and agreed with the findings in the report. No results found.    No orders of the defined types were placed in this encounter.  All questions were answered. The patient knows to call the clinic with any problems, questions or concerns. No barriers to learning was detected. The total time spent in the appointment was 30 minutes.     Malachy Mood, MD 12/30/2022   Carolin Coy, CMA, am acting as scribe for Malachy Mood, MD.   I have reviewed the above documentation for accuracy and completeness, and I agree with the above.

## 2022-12-22 NOTE — Progress Notes (Signed)
[facility] CSW Progress Note  Clinical Child psychotherapist contacted patient by phone to discuss questions regarding financial resources.  Pt spoke with a patient Dance movement psychotherapist earlier today and will apply for the [employer].  CSW informed pt she may also qualify for a grant from [employer] in [place] which could be used specifically for medical bills.  Pt scheduled for infusion 5/1.  CSW to meet pt in infusion to provide with grant and assist with application.      Rachel Moulds, LCSW Clinical Social Worker [facility]

## 2022-12-23 ENCOUNTER — Other Ambulatory Visit: Payer: Self-pay

## 2022-12-23 ENCOUNTER — Encounter: Payer: Self-pay | Admitting: Hematology

## 2022-12-23 ENCOUNTER — Ambulatory Visit: Payer: Medicare Other | Admitting: Nurse Practitioner

## 2022-12-23 ENCOUNTER — Inpatient Hospital Stay: Payer: Medicare Other

## 2022-12-23 ENCOUNTER — Inpatient Hospital Stay: Payer: Medicare Other | Admitting: Licensed Clinical Social Worker

## 2022-12-23 ENCOUNTER — Inpatient Hospital Stay: Payer: Medicare Other | Attending: Hematology

## 2022-12-23 VITALS — BP 150/53 | HR 98 | Temp 98.3°F | Resp 17 | Wt 191.6 lb

## 2022-12-23 DIAGNOSIS — Z170 Estrogen receptor positive status [ER+]: Secondary | ICD-10-CM

## 2022-12-23 DIAGNOSIS — C50412 Malignant neoplasm of upper-outer quadrant of left female breast: Secondary | ICD-10-CM

## 2022-12-23 DIAGNOSIS — G7000 Myasthenia gravis without (acute) exacerbation: Secondary | ICD-10-CM | POA: Insufficient documentation

## 2022-12-23 DIAGNOSIS — Z7982 Long term (current) use of aspirin: Secondary | ICD-10-CM | POA: Insufficient documentation

## 2022-12-23 DIAGNOSIS — Z79899 Other long term (current) drug therapy: Secondary | ICD-10-CM | POA: Insufficient documentation

## 2022-12-23 DIAGNOSIS — Z79620 Long term (current) use of immunosuppressive biologic: Secondary | ICD-10-CM | POA: Insufficient documentation

## 2022-12-23 DIAGNOSIS — Z7952 Long term (current) use of systemic steroids: Secondary | ICD-10-CM | POA: Insufficient documentation

## 2022-12-23 DIAGNOSIS — Z794 Long term (current) use of insulin: Secondary | ICD-10-CM | POA: Diagnosis not present

## 2022-12-23 DIAGNOSIS — Z5111 Encounter for antineoplastic chemotherapy: Secondary | ICD-10-CM | POA: Diagnosis not present

## 2022-12-23 DIAGNOSIS — T451X5A Adverse effect of antineoplastic and immunosuppressive drugs, initial encounter: Secondary | ICD-10-CM | POA: Diagnosis not present

## 2022-12-23 DIAGNOSIS — Z79624 Long term (current) use of inhibitors of nucleotide synthesis: Secondary | ICD-10-CM | POA: Insufficient documentation

## 2022-12-23 DIAGNOSIS — G620 Drug-induced polyneuropathy: Secondary | ICD-10-CM | POA: Diagnosis not present

## 2022-12-23 DIAGNOSIS — Z7984 Long term (current) use of oral hypoglycemic drugs: Secondary | ICD-10-CM | POA: Diagnosis not present

## 2022-12-23 DIAGNOSIS — Z95828 Presence of other vascular implants and grafts: Secondary | ICD-10-CM

## 2022-12-23 DIAGNOSIS — Z791 Long term (current) use of non-steroidal anti-inflammatories (NSAID): Secondary | ICD-10-CM | POA: Diagnosis not present

## 2022-12-23 LAB — CBC WITH DIFFERENTIAL (CANCER CENTER ONLY)
Abs Immature Granulocytes: 0.03 10*3/uL (ref 0.00–0.07)
Basophils Absolute: 0 10*3/uL (ref 0.0–0.1)
Basophils Relative: 1 %
Eosinophils Absolute: 0.1 10*3/uL (ref 0.0–0.5)
Eosinophils Relative: 2 %
HCT: 28.1 % — ABNORMAL LOW (ref 36.0–46.0)
Hemoglobin: 9.1 g/dL — ABNORMAL LOW (ref 12.0–15.0)
Immature Granulocytes: 1 %
Lymphocytes Relative: 28 %
Lymphs Abs: 1.4 10*3/uL (ref 0.7–4.0)
MCH: 30.7 pg (ref 26.0–34.0)
MCHC: 32.4 g/dL (ref 30.0–36.0)
MCV: 94.9 fL (ref 80.0–100.0)
Monocytes Absolute: 0.3 10*3/uL (ref 0.1–1.0)
Monocytes Relative: 7 %
Neutro Abs: 3.1 10*3/uL (ref 1.7–7.7)
Neutrophils Relative %: 61 %
Platelet Count: 211 10*3/uL (ref 150–400)
RBC: 2.96 MIL/uL — ABNORMAL LOW (ref 3.87–5.11)
RDW: 13.3 % (ref 11.5–15.5)
WBC Count: 5 10*3/uL (ref 4.0–10.5)
nRBC: 0 % (ref 0.0–0.2)

## 2022-12-23 LAB — CMP (CANCER CENTER ONLY)
ALT: 22 U/L (ref 0–44)
AST: 24 U/L (ref 15–41)
Albumin: 3.8 g/dL (ref 3.5–5.0)
Alkaline Phosphatase: 151 U/L — ABNORMAL HIGH (ref 38–126)
Anion gap: 7 (ref 5–15)
BUN: 24 mg/dL — ABNORMAL HIGH (ref 8–23)
CO2: 24 mmol/L (ref 22–32)
Calcium: 9.4 mg/dL (ref 8.9–10.3)
Chloride: 108 mmol/L (ref 98–111)
Creatinine: 0.96 mg/dL (ref 0.44–1.00)
GFR, Estimated: >60 mL/min (ref >60)
Glucose, Bld: 234 mg/dL — ABNORMAL HIGH (ref 70–99)
Potassium: 3.6 mmol/L (ref 3.5–5.1)
Sodium: 139 mmol/L (ref 135–145)
Total Bilirubin: 0.3 mg/dL (ref 0.3–1.2)
Total Protein: 7.6 g/dL (ref 6.5–8.1)

## 2022-12-23 MED ORDER — SODIUM CHLORIDE 0.9 % IV SOLN: Freq: Once | INTRAVENOUS | Status: AC

## 2022-12-23 MED ORDER — PROCHLORPERAZINE MALEATE 10 MG PO TABS
10.0000 mg | ORAL_TABLET | Freq: Once | ORAL | Status: AC
  Administered 2022-12-23: 10 mg via ORAL
  Filled 2022-12-23: qty 1

## 2022-12-23 MED ORDER — PACLITAXEL PROTEIN-BOUND CHEMO INJECTION 100 MG
70.0000 mg/m2 | Freq: Once | INTRAVENOUS | Status: AC
  Administered 2022-12-23: 150 mg via INTRAVENOUS
  Filled 2022-12-23: qty 30

## 2022-12-23 MED ORDER — HEPARIN SOD (PORK) LOCK FLUSH 100 UNIT/ML IV SOLN
500.0000 [IU] | Freq: Once | INTRAVENOUS | Status: AC | PRN
  Administered 2022-12-23: 500 [IU]

## 2022-12-23 MED ORDER — SODIUM CHLORIDE 0.9% FLUSH
10.0000 mL | INTRAVENOUS | Status: DC | PRN
  Administered 2022-12-23: 10 mL

## 2022-12-23 MED ORDER — SODIUM CHLORIDE 0.9% FLUSH
10.0000 mL | Freq: Once | INTRAVENOUS | Status: AC
  Administered 2022-12-23: 10 mL

## 2022-12-23 NOTE — Patient Instructions (Signed)
[facility]  Discharge Instructions: Thank you for choosing [facility] to provide your oncology and hematology care.   If you have a lab appointment with the Cancer Center, please go directly to the Cancer Center and check in at the registration area.   Wear comfortable clothing and clothing appropriate for easy access to any Portacath or PICC line.   We strive to give you quality time with your provider. You may need to reschedule your appointment if you arrive late (15 or more minutes).  Arriving late affects you and other patients whose appointments are after yours.  Also, if you miss three or more appointments without notifying the office, you may be dismissed from the clinic at the provider's discretion.      For prescription refill requests, have your pharmacy contact our office and allow 72 hours for refills to be completed.    Today you received the following chemotherapy and/or immunotherapy agents: Abraxane      To help prevent nausea and vomiting after your treatment, we encourage you to take your nausea medication as directed.  BELOW ARE SYMPTOMS THAT SHOULD BE REPORTED IMMEDIATELY: *FEVER GREATER THAN 100.4 F (38 C) OR HIGHER *CHILLS OR SWEATING *NAUSEA AND VOMITING THAT IS NOT CONTROLLED WITH YOUR NAUSEA MEDICATION *UNUSUAL SHORTNESS OF BREATH *UNUSUAL BRUISING OR BLEEDING *URINARY PROBLEMS (pain or burning when urinating, or frequent urination) *BOWEL PROBLEMS (unusual diarrhea, constipation, pain near the anus) TENDERNESS IN MOUTH AND THROAT WITH OR WITHOUT PRESENCE OF ULCERS (sore throat, sores in mouth, or a toothache) UNUSUAL RASH, SWELLING OR PAIN  UNUSUAL VAGINAL DISCHARGE OR ITCHING   Items with * indicate a potential emergency and should be followed up as soon as possible or go to the Emergency Department if any problems should occur.  Please show the CHEMOTHERAPY ALERT CARD or IMMUNOTHERAPY ALERT CARD at  check-in to the Emergency Department and triage nurse.  Should you have questions after your visit or need to cancel or reschedule your appointment, please contact [facility]  Dept: [phone]  and follow the prompts.  Office hours are 8:00 a.m. to 4:30 p.m. Monday - Friday. Please note that voicemails left after 4:00 p.m. may not be returned until the following business day.  We are closed weekends and major holidays. You have access to a nurse at all times for urgent questions. Please call the main number to the clinic Dept: [phone] and follow the prompts.   For any non-urgent questions, you may also contact your provider using MyChart. We now offer e-Visits for anyone 18 and older to request care online for non-urgent symptoms. For details visit [email or web address].   Also download the MyChart app! Go to the app store, search "MyChart", open the app, select [facility], and log in with your MyChart username and password.   

## 2022-12-23 NOTE — Progress Notes (Signed)
Patient provided documentation for [employer] grant at check-in,  Patient approved for one-time $1000 [employer] grant to assist with personal expenses while going through treatment. She was given a copy of the approval letter and expense sheet along with Outpatient pharmacy information in green folder. She was advised to contact me at earliest convenience after treatment to discuss expenses in detail. She received a gift card today.  She has my card to do so and for any additional financial questions or concerns.

## 2022-12-23 NOTE — Progress Notes (Signed)
Confirmed dose of Abraxane now 70 mg/m2 per Dr Mosetta Putt.  Ebony Hail, Pharm.D., CPP 12/23/2022@10 :30 AM

## 2022-12-23 NOTE — Progress Notes (Signed)
[facility] CSW Progress Note  Visual merchandiser  met with pt in infusion to provide w/ a [employer].    All information to be completed by a medical provided has been filled out and pt provided w/ a copy of her pathology report.  Pt instructed to complete the remainder of the grant application and collect the required documentation.   Pt made aware she can submit the grant herself or bring the grant back to CSW upon completion and it will be submitted on her behalf.  CSW to continue to follow to provide support as appropriate throughout duration of treatment.      Rachel Moulds, LCSW Clinical Social Worker [facility]

## 2022-12-24 DIAGNOSIS — G4733 Obstructive sleep apnea (adult) (pediatric): Secondary | ICD-10-CM | POA: Diagnosis not present

## 2022-12-30 ENCOUNTER — Inpatient Hospital Stay: Payer: Medicare Other

## 2022-12-30 ENCOUNTER — Inpatient Hospital Stay: Payer: Medicare Other | Admitting: Hematology

## 2022-12-30 ENCOUNTER — Inpatient Hospital Stay (HOSPITAL_BASED_OUTPATIENT_CLINIC_OR_DEPARTMENT_OTHER): Payer: Medicare Other

## 2022-12-30 ENCOUNTER — Encounter: Payer: Self-pay | Admitting: Hematology

## 2022-12-30 ENCOUNTER — Other Ambulatory Visit: Payer: Self-pay

## 2022-12-30 VITALS — BP 162/76 | HR 87 | Temp 97.8°F | Resp 17 | Wt 193.6 lb

## 2022-12-30 VITALS — BP 177/77 | HR 86 | Temp 97.7°F | Resp 18

## 2022-12-30 DIAGNOSIS — Z791 Long term (current) use of non-steroidal anti-inflammatories (NSAID): Secondary | ICD-10-CM | POA: Diagnosis not present

## 2022-12-30 DIAGNOSIS — Z7984 Long term (current) use of oral hypoglycemic drugs: Secondary | ICD-10-CM | POA: Diagnosis not present

## 2022-12-30 DIAGNOSIS — Z95828 Presence of other vascular implants and grafts: Secondary | ICD-10-CM

## 2022-12-30 DIAGNOSIS — Z7952 Long term (current) use of systemic steroids: Secondary | ICD-10-CM | POA: Diagnosis not present

## 2022-12-30 DIAGNOSIS — G620 Drug-induced polyneuropathy: Secondary | ICD-10-CM

## 2022-12-30 DIAGNOSIS — R519 Headache, unspecified: Secondary | ICD-10-CM

## 2022-12-30 DIAGNOSIS — Z170 Estrogen receptor positive status [ER+]: Secondary | ICD-10-CM | POA: Diagnosis not present

## 2022-12-30 DIAGNOSIS — Z79899 Other long term (current) drug therapy: Secondary | ICD-10-CM | POA: Diagnosis not present

## 2022-12-30 DIAGNOSIS — Z794 Long term (current) use of insulin: Secondary | ICD-10-CM | POA: Diagnosis not present

## 2022-12-30 DIAGNOSIS — T451X5A Adverse effect of antineoplastic and immunosuppressive drugs, initial encounter: Secondary | ICD-10-CM | POA: Diagnosis not present

## 2022-12-30 DIAGNOSIS — G7000 Myasthenia gravis without (acute) exacerbation: Secondary | ICD-10-CM

## 2022-12-30 DIAGNOSIS — Z79620 Long term (current) use of immunosuppressive biologic: Secondary | ICD-10-CM | POA: Diagnosis not present

## 2022-12-30 DIAGNOSIS — Z7982 Long term (current) use of aspirin: Secondary | ICD-10-CM | POA: Diagnosis not present

## 2022-12-30 DIAGNOSIS — C50412 Malignant neoplasm of upper-outer quadrant of left female breast: Secondary | ICD-10-CM

## 2022-12-30 DIAGNOSIS — Z79624 Long term (current) use of inhibitors of nucleotide synthesis: Secondary | ICD-10-CM | POA: Diagnosis not present

## 2022-12-30 DIAGNOSIS — Z5111 Encounter for antineoplastic chemotherapy: Secondary | ICD-10-CM | POA: Diagnosis not present

## 2022-12-30 LAB — CBC WITH DIFFERENTIAL (CANCER CENTER ONLY)
Abs Immature Granulocytes: 0.02 10*3/uL (ref 0.00–0.07)
Basophils Absolute: 0 10*3/uL (ref 0.0–0.1)
Basophils Relative: 1 %
Eosinophils Absolute: 0.1 10*3/uL (ref 0.0–0.5)
Eosinophils Relative: 2 %
HCT: 27.4 % — ABNORMAL LOW (ref 36.0–46.0)
Hemoglobin: 9 g/dL — ABNORMAL LOW (ref 12.0–15.0)
Immature Granulocytes: 1 %
Lymphocytes Relative: 38 %
Lymphs Abs: 1.5 10*3/uL (ref 0.7–4.0)
MCH: 31.1 pg (ref 26.0–34.0)
MCHC: 32.8 g/dL (ref 30.0–36.0)
MCV: 94.8 fL (ref 80.0–100.0)
Monocytes Absolute: 0.3 10*3/uL (ref 0.1–1.0)
Monocytes Relative: 9 %
Neutro Abs: 2 10*3/uL (ref 1.7–7.7)
Neutrophils Relative %: 49 %
Platelet Count: 282 10*3/uL (ref 150–400)
RBC: 2.89 MIL/uL — ABNORMAL LOW (ref 3.87–5.11)
RDW: 13.6 % (ref 11.5–15.5)
WBC Count: 3.9 10*3/uL — ABNORMAL LOW (ref 4.0–10.5)
nRBC: 0 % (ref 0.0–0.2)

## 2022-12-30 LAB — CMP (CANCER CENTER ONLY)
ALT: 18 U/L (ref 0–44)
AST: 17 U/L (ref 15–41)
Albumin: 3.9 g/dL (ref 3.5–5.0)
Alkaline Phosphatase: 149 U/L — ABNORMAL HIGH (ref 38–126)
Anion gap: 7 (ref 5–15)
BUN: 23 mg/dL (ref 8–23)
CO2: 22 mmol/L (ref 22–32)
Calcium: 9.1 mg/dL (ref 8.9–10.3)
Chloride: 113 mmol/L — ABNORMAL HIGH (ref 98–111)
Creatinine: 1.01 mg/dL — ABNORMAL HIGH (ref 0.44–1.00)
GFR, Estimated: >60 mL/min (ref >60)
Glucose, Bld: 148 mg/dL — ABNORMAL HIGH (ref 70–99)
Potassium: 3.4 mmol/L — ABNORMAL LOW (ref 3.5–5.1)
Sodium: 142 mmol/L (ref 135–145)
Total Bilirubin: 0.3 mg/dL (ref 0.3–1.2)
Total Protein: 7.4 g/dL (ref 6.5–8.1)

## 2022-12-30 MED ORDER — SODIUM CHLORIDE 0.9 % IV SOLN: Freq: Once | INTRAVENOUS | Status: AC

## 2022-12-30 MED ORDER — HEPARIN SOD (PORK) LOCK FLUSH 100 UNIT/ML IV SOLN
500.0000 [IU] | Freq: Once | INTRAVENOUS | Status: AC | PRN
  Administered 2022-12-30: 500 [IU]

## 2022-12-30 MED ORDER — PROCHLORPERAZINE MALEATE 10 MG PO TABS
10.0000 mg | ORAL_TABLET | Freq: Once | ORAL | Status: AC
  Administered 2022-12-30: 10 mg via ORAL
  Filled 2022-12-30: qty 1

## 2022-12-30 MED ORDER — SODIUM CHLORIDE 0.9% FLUSH
10.0000 mL | INTRAVENOUS | Status: DC | PRN
  Administered 2022-12-30: 10 mL

## 2022-12-30 MED ORDER — PACLITAXEL PROTEIN-BOUND CHEMO INJECTION 100 MG
80.0000 mg/m2 | Freq: Once | INTRAVENOUS | Status: AC
  Administered 2022-12-30: 150 mg via INTRAVENOUS
  Filled 2022-12-30: qty 30

## 2022-12-30 MED ORDER — ACETAMINOPHEN 325 MG PO TABS
650.0000 mg | ORAL_TABLET | ORAL | Status: AC
  Administered 2022-12-30: 650 mg via ORAL
  Filled 2022-12-30: qty 2

## 2022-12-30 MED ORDER — SODIUM CHLORIDE 0.9% FLUSH
10.0000 mL | Freq: Once | INTRAVENOUS | Status: AC
  Administered 2022-12-30: 10 mL

## 2022-12-30 NOTE — Assessment & Plan Note (Signed)
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  I reduced her Abraxane dose to 60 mg/m and continue. She has been tolerating this well    

## 2022-12-30 NOTE — Assessment & Plan Note (Signed)
-  She is on Rituxan every 6 months, CellCept and prednisone.  will continue during chemo  -I previously discussed with her neurologist  

## 2022-12-30 NOTE — Patient Instructions (Signed)
[facility] [facility]  Discharge Instructions: Thank you for choosing [facility] to provide your oncology and hematology care.   If you have a lab appointment with the Cancer Center, please go directly to the Cancer Center and check in at the registration area.   Wear comfortable clothing and clothing appropriate for easy access to any Portacath or PICC line.   We strive to give you quality time with your provider. You may need to reschedule your appointment if you arrive late (15 or more minutes).  Arriving late affects you and other patients whose appointments are after yours.  Also, if you miss three or more appointments without notifying the office, you may be dismissed from the clinic at the provider's discretion.      For prescription refill requests, have your pharmacy contact our office and allow 72 hours for refills to be completed.    Today you received the following chemotherapy and/or immunotherapy agents: Abraxane      To help prevent nausea and vomiting after your treatment, we encourage you to take your nausea medication as directed.  BELOW ARE SYMPTOMS THAT SHOULD BE REPORTED IMMEDIATELY: *FEVER GREATER THAN 100.4 F (38 C) OR HIGHER *CHILLS OR SWEATING *NAUSEA AND VOMITING THAT IS NOT CONTROLLED WITH YOUR NAUSEA MEDICATION *UNUSUAL SHORTNESS OF BREATH *UNUSUAL BRUISING OR BLEEDING *URINARY PROBLEMS (pain or burning when urinating, or frequent urination) *BOWEL PROBLEMS (unusual diarrhea, constipation, pain near the anus) TENDERNESS IN MOUTH AND THROAT WITH OR WITHOUT PRESENCE OF ULCERS (sore throat, sores in mouth, or a toothache) UNUSUAL RASH, SWELLING OR PAIN  UNUSUAL VAGINAL DISCHARGE OR ITCHING   Items with * indicate a potential emergency and should be followed up as soon as possible or go to the Emergency Department if any problems should occur.  Please show the CHEMOTHERAPY ALERT CARD or IMMUNOTHERAPY ALERT CARD at  check-in to the Emergency Department and triage nurse.  Should you have questions after your visit or need to cancel or reschedule your appointment, please contact [facility]  Dept: [phone]  and follow the prompts.  Office hours are 8:00 a.m. to 4:30 p.m. Monday - Friday. Please note that voicemails left after 4:00 p.m. may not be returned until the following business day.  We are closed weekends and major holidays. You have access to a nurse at all times for urgent questions. Please call the main number to the clinic Dept: [phone] and follow the prompts.   For any non-urgent questions, you may also contact your provider using MyChart. We now offer e-Visits for anyone 18 and older to request care online for non-urgent symptoms. For details visit [email or web address].   Also download the MyChart app! Go to the app store, search "MyChart", open the app, select [facility], and log in with your MyChart username and password.   

## 2022-12-30 NOTE — Assessment & Plan Note (Signed)
-  secondary to chemo, her DM may contribute also -I called in gabapentin, she has not started yet.  She will continue cryotherapy during chemo, and use warm gloves when the blanket at night. -neuropathy is minimum now    

## 2023-01-07 ENCOUNTER — Telehealth: Payer: Self-pay | Admitting: Radiation Oncology

## 2023-01-07 ENCOUNTER — Other Ambulatory Visit: Payer: Self-pay

## 2023-01-07 NOTE — Telephone Encounter (Signed)
Called patient to schedule a FUN w. Jill Side. No answer, LVM for a return call.

## 2023-01-08 ENCOUNTER — Telehealth: Payer: Self-pay | Admitting: Radiation Oncology

## 2023-01-08 NOTE — Telephone Encounter (Signed)
Called patient to schedule a FUN visit w. Alison. No answer, LVM for a return call.  

## 2023-01-09 ENCOUNTER — Other Ambulatory Visit: Payer: Self-pay | Admitting: Hematology

## 2023-01-12 NOTE — Progress Notes (Unsigned)
Patient Care Team: Irena Reichmann, DO as PCP - General (Family Medicine) Micki Riley, MD as Consulting Physician (Neurology) Ihor Austin, NP as Nurse Practitioner (Neurology) Donnelly Angelica, RN as Oncology Nurse Navigator Pershing Proud, RN as Oncology Nurse Navigator Abigail Miyamoto, MD as Consulting Physician (General Surgery) Malachy Mood, MD as Consulting Physician (Hematology) Dorothy Puffer, MD as Consulting Physician (Radiation Oncology) Dianne Dun, MD as Consulting Physician (Rheumatology)   CHIEF COMPLAINT: Follow up left breast cancer   Oncology History Overview Note   Cancer Staging  Malignant neoplasm of upper-outer quadrant of left breast in female, estrogen receptor positive (HCC) Staging form: Breast, AJCC 8th Edition - Clinical: Stage IB (cT1b, cN0, cM0, G3, ER+, PR-, HER2-) - Signed by [name] Bacon, PA-C on 08/10/2022 Stage prefix: Initial diagnosis Method of lymph node assessment: Clinical Histologic grading system: 3 grade system - Pathologic stage from 09/02/2022: Stage IB (pT1b, pN0, cM0, G2, ER-, PR-, HER2-) - Signed by Malachy Mood, MD on 09/23/2022 Stage prefix: Initial diagnosis Histologic grading system: 3 grade system Residual tumor (R): R0 - None     Malignant neoplasm of upper-outer quadrant of left breast in female, estrogen receptor positive (HCC)  08/10/2022 Initial Diagnosis   Malignant neoplasm of upper-outer quadrant of left female breast (HCC)   08/10/2022 Cancer Staging   Staging form: Breast, AJCC 8th Edition - Clinical: Stage IB (cT1b, cN0, cM0, G3, ER+, PR-, HER2-) - Signed by [name] Bacon, PA-C on 08/10/2022 Stage prefix: Initial diagnosis Method of lymph node assessment: Clinical Histologic grading system: 3 grade system   09/02/2022 Cancer Staging   Staging form: Breast, AJCC 8th Edition - Pathologic stage from 09/02/2022: Stage IB (pT1b, pN0, cM0, G2, ER-, PR-, HER2-) - Signed by Malachy Mood, MD on  09/23/2022 Stage prefix: Initial diagnosis Histologic grading system: 3 grade system Residual tumor (R): R0 - None   10/21/2022 -  Chemotherapy   Patient is on Treatment Plan : BREAST PACLitaxel-Albumin (Abraxane) (100) D1,8,15 q28d        CURRENT THERAPY: Adjuvant chemotherapy with Abraxane weekly (2 weeks on and one week off) for 12 treatments, starting 10/21/22   INTERVAL HISTORY [name] returns for follow up and treatment as scheduled. Last seen by Dr. Mosetta [name] 12/30/22 and completed C3D8 abraxane at full dose. Now completed 9 weeks total. She is doing well. Nailbeds little dark and the skin on fingers/toes. Neuropathy remains mild and intermittent, which is her baseline prior to chemo. Warm blanket at night helps. She has diarrhea after meals, takes imodium BID which is partially effective. Still numb in the left axilla. She had an episode of hypoglycemia in the 40'[name] on Mother'[name] Day. Denies other side effects or any other specific concerns.   ROS  All other systems reviewed and negative  Past Medical History:  Diagnosis Date   Arthritis    "all over my body" (03/13/2013)   Asthma    Asymptomatic carotid artery stenosis, bilateral 10/08/2018   Breast cancer (HCC)    GERD (gastroesophageal reflux disease)    H/O hiatal hernia    Headache    pt states she has had headaches for about 6 months "off and on"   Heart murmur    pt had echocardiogram on 09/17/21   Hypercholesterolemia 10/08/2018   Hypertension    Hypothyroidism    Myasthenia gravis (HCC)    "in my eyes; diagnsosed > 7 yr ago" (03/13/2013)   Sleep apnea    on CPAP  Stroke [facility]) 2021   Thyroid carcinoma (HCC)    Type II diabetes mellitus (HCC)      Past Surgical History:  Procedure Laterality Date   ABDOMINAL HYSTERECTOMY     ANTERIOR CERVICAL DECOMP/DISCECTOMY FUSION     "I've had severa ORs; always went in from the front" (03/13/2013)   APPENDECTOMY     BREAST LUMPECTOMY WITH RADIOACTIVE SEED AND SENTINEL LYMPH  NODE BIOPSY Left 09/02/2022   Procedure: LEFT BREAST LUMPECTOMY WITH RADIOACTIVE SEED AND SENTINEL LYMPH NODE BIOPSY;  Surgeon: Abigail Miyamoto, MD;  Location: [facility] OR;  Service: General;  Laterality: Left;   CARDIAC CATHETERIZATION     "several" (03/13/2013)   CARPAL TUNNEL RELEASE Right    CATARACT EXTRACTION W/ INTRAOCULAR LENS  IMPLANT, BILATERAL Bilateral    CHOLECYSTECTOMY     KNEE ARTHROSCOPY Left    SHOULDER ARTHROSCOPY W/ ROTATOR CUFF REPAIR Left    TONSILLECTOMY     TOTAL THYROIDECTOMY     TRANSCAROTID ARTERY REVASCULARIZATION  Left 09/24/2021   Procedure: LEFT TRANSCAROTID ARTERY REVASCULARIZATION;  Surgeon: Leonie Douglas, MD;  Location: [facility] OR;  Service: Vascular;  Laterality: Left;   TRANSCAROTID ARTERY REVASCULARIZATION  Right 10/23/2021   Procedure: Right Transcarotid Artery Revascularization;  Surgeon: Leonie Douglas, MD;  Location: [facility] OR;  Service: Vascular;  Laterality: Right;   ULTRASOUND GUIDANCE FOR VASCULAR ACCESS Right 09/24/2021   Procedure: ULTRASOUND GUIDANCE FOR VASCULAR ACCESS;  Surgeon: Leonie Douglas, MD;  Location: [facility] OR;  Service: Vascular;  Laterality: Right;   ULTRASOUND GUIDANCE FOR VASCULAR ACCESS Right 10/23/2021   Procedure: ULTRASOUND GUIDANCE FOR VASCULAR ACCESS, LEFT FEMORAL VEIN;  Surgeon: Leonie Douglas, MD;  Location: [facility] OR;  Service: Vascular;  Laterality: Right;     Outpatient Encounter Medications as of 01/13/2023  Medication Sig Note   acetaminophen (TYLENOL) 500 MG tablet Take 500 mg by mouth every 6 (six) hours as needed for moderate pain.    aspirin EC 81 MG EC tablet Take 1 tablet (81 mg total) by mouth daily.    Biotin 5000 MCG CAPS Take 5,000 mcg by mouth daily with breakfast.     cycloSPORINE (RESTASIS) 0.05 % ophthalmic emulsion Place 1 drop into both eyes 2 (two) times daily.    diclofenac Sodium (VOLTAREN) 1 % GEL Apply 2 g topically 4 (four) times daily. (Patient taking differently: Apply 1 application  topically daily as needed  (pain).)    diphenhydrAMINE 25 mg in sodium chloride 0.9 % 50 mL Inject 25 mg into the vein See admin instructions. Administer 25 mg intravenously at start of Gamunex infusion, then administer 25 mg during infusion    diphenoxylate-atropine (LOMOTIL) 2.5-0.025 MG tablet Take 1-2 tablets by mouth 4 (four) times daily as needed for diarrhea or loose stools.    ELDERBERRY PO Take 1 tablet by mouth daily.    empagliflozin (JARDIANCE) 25 MG TABS tablet Take 25 mg by mouth daily.    esomeprazole (NEXIUM) 40 MG capsule Take 1 capsule (40 mg total) by mouth 2 (two) times daily.    Evolocumab with Infusor (REPATHA PUSHTRONEX SYSTEM) 420 MG/3.5ML SOCT Inject 420 mg into the skin every 30 (thirty) days.    Exenatide ER (BYDUREON BCISE) 2 MG/0.85ML AUIJ Inject 2 mg into the skin every Monday.    ezetimibe (ZETIA) 10 MG tablet TAKE 1 TABLET BY MOUTH EVERY DAY    furosemide (LASIX) 40 MG tablet Take 40 mg by mouth daily as needed for edema.    gabapentin (NEURONTIN) 100  MG capsule Take 1 capsule (100 mg total) by mouth at bedtime. Increase to 2 cap at night in 2 weeks if needed    Immune Globulin, Human, 40 GM/400ML SOLN Inject into the vein every 6 (six) weeks. Infusions are administered at home by home health nurse - last infusion 09/08/21 and 09/09/21; next infusion due 10/19/21 and 10/20/21 09/17/2021: Current dose is unknown by patient   insulin degludec (TRESIBA FLEXTOUCH) 100 UNIT/ML FlexTouch Pen Inject 15 Units into the skin in the morning.    Insulin Disposable Pump (OMNIPOD DASH 5 PACK PODS) MISC Use with Humalog    insulin lispro (HUMALOG) 100 UNIT/ML injection Inject into the skin See admin instructions. Inject subcutaneously 3 (three) times daily with meals With meals through the Omnipod Dash Pump every 72 hours as directed    levothyroxine (SYNTHROID) 175 MCG tablet Take 175 mcg by mouth daily before breakfast.    lidocaine-prilocaine (EMLA) cream Apply to affected area once    loperamide (IMODIUM A-D)  2 MG tablet Take 2 mg by mouth 4 (four) times daily as needed for diarrhea or loose stools.    loratadine (CLARITIN) 10 MG tablet Take 1 tablet (10 mg total) by mouth at bedtime.    meclizine (ANTIVERT) 25 MG tablet Take 25 mg by mouth 3 (three) times daily as needed for dizziness or nausea (or migraine-related nausea).    montelukast (SINGULAIR) 10 MG tablet Take 1 tablet (10 mg total) by mouth every morning.    mycophenolate (CELLCEPT) 500 MG tablet Take 1,000 mg by mouth 2 (two) times daily. For myasthenia gravis    olmesartan (BENICAR) 20 MG tablet Take 1 tablet (20 mg total) by mouth daily.    ondansetron (ZOFRAN) 8 MG tablet Take 1 tablet (8 mg total) by mouth every 8 (eight) hours as needed for nausea or vomiting.    predniSONE (DELTASONE) 2.5 MG tablet Take 7.5 mg by mouth every morning. For myasthenia gravis 09/17/2021: continuous   PRESCRIPTION MEDICATION Pt uses a cpap nightly    PROAIR HFA 108 (90 BASE) MCG/ACT inhaler Inhale 2 puffs into the lungs every 6 (six) hours as needed for wheezing or shortness of breath.     prochlorperazine (COMPAZINE) 10 MG tablet Take 1 tablet (10 mg total) by mouth every 6 (six) hours as needed for nausea or vomiting.    riTUXimab (RITUXAN) 500 MG/50ML injection Inject into the vein every 6 (six) months. 09/17/2021: Administered at [facility] - pt could not verify current dose   topiramate (TOPAMAX) 50 MG tablet Take 1 tablet (50 mg total) by mouth at bedtime.    traMADol (ULTRAM) 50 MG tablet Take 1 tablet (50 mg total) by mouth every 6 (six) hours as needed for moderate pain.    vitamin B-12 (CYANOCOBALAMIN) 1000 MCG tablet Take 1,000 mcg by mouth daily.    Vitamin D, Ergocalciferol, (DRISDOL) 50000 UNITS CAPS capsule Take 50,000 Units by mouth every Monday.     rosuvastatin (CRESTOR) 40 MG tablet Take 1 tablet (40 mg total) by mouth daily.    [EXPIRED] sodium chloride flush (NS) 0.9 % injection 10 mL     No facility-administered encounter  medications on file as of 01/13/2023.     Today'[name] Vitals   01/13/23 0832 01/13/23 0838  BP: (!) 158/55   Pulse: 95   Resp: 17   Temp: 98.4 F (36.9 C)   TempSrc: Oral   SpO2: 98%   Weight: 191 lb 11.2 oz (87 kg)   PainSc:  0-No pain   Body mass index is 33.96 kg/m.   PHYSICAL EXAM GENERAL:alert, no distress and comfortable SKIN: no rash. Mild hyperpigmentation to digits and nailbeds EYES: sclera clear LUNGS: normal breathing effort HEART:  trace pedal edema NEURO: alert & oriented x 3 with fluent speech Breast exam: deferred PAC without erythema    CBC    Component Value Date/Time   WBC 6.1 01/13/2023 0813   WBC 10.4 09/03/2022 0442   RBC 2.92 (L) 01/13/2023 0813   HGB 9.2 (L) 01/13/2023 0813   HCT 27.5 (L) 01/13/2023 0813   PLT 238 01/13/2023 0813   MCV 94.2 01/13/2023 0813   MCH 31.5 01/13/2023 0813   MCHC 33.5 01/13/2023 0813   RDW 14.2 01/13/2023 0813   LYMPHSABS 1.3 01/13/2023 0813   MONOABS 0.6 01/13/2023 0813   EOSABS 0.1 01/13/2023 0813   BASOSABS 0.0 01/13/2023 0813     CMP     Component Value Date/Time   NA 140 01/13/2023 0813   NA 143 05/31/2019 1101   K 3.3 (L) 01/13/2023 0813   CL 112 (H) 01/13/2023 0813   CO2 19 (L) 01/13/2023 0813   GLUCOSE 211 (H) 01/13/2023 0813   BUN 21 01/13/2023 0813   BUN 24 05/31/2019 1101   CREATININE 1.13 (H) 01/13/2023 0813   CALCIUM 8.9 01/13/2023 0813   PROT 6.6 01/13/2023 0813   PROT 6.5 05/31/2019 1101   ALBUMIN 3.7 01/13/2023 0813   ALBUMIN 4.0 05/31/2019 1101   AST 26 01/13/2023 0813   ALT 25 01/13/2023 0813   ALKPHOS 150 (H) 01/13/2023 0813   BILITOT 0.3 01/13/2023 0813   GFRNONAA 53 (L) 01/13/2023 0813   GFRAA >60 12/28/2019 1159     ASSESSMENT & PLAN:[name] Student is a 72 y.o. 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 -Due to the triple negative disease and high recurrence risk, adjuvant chemo with weekly abraxane X12 was recommended, she began cycle 1 on 10/21/22, dose has been adjusted for neuropathy but otherwise tolerating well -[name] appears stable, [name]/p 9 doses weekly Abraxane. Tolerating well with mild diarrhea, partially managed on imodium, and stable baseline neuropathy. I recommend to increase imodium -Able to recover and function and maintain adequate performance status. -Labs reviewed, adequate to proceed with cycle 4 day 1 (week 10) Abraxane today as scheduled, same dose.   -She has 2 more cycles then will proceed with radiation, we briefly discussed what that would entail -Follow-up in 2 weeks with final cycle of Abraxane  Myasthenia gravis -She is on Rituxan every 6 months, CellCept and prednisone.  will continue during chemo  -Dr. Mosetta [name] discussed with neuro -No flare since starting chemo, will monitor    Anemia of chronic disease, DM, co-morbidities -Baseline hemoglobin 9-10.4, takes prenatal multivitamin daily -Stable in hgb 9 range on chemo -She had hypoglycemia once, BG 47 at home on Mother'[name] Day. I recommend to f/up with endo     PLAN: -Labs reviewed -Proceed with C4D1 (week 10) Abraxane today as scheduled, same dose -Reviewed symptom management, increase Imodium as needed -Follow-up with Endo for episodic hypoglycemia -Return next week for week 11, then follow-up and final Abraxane in 2 weeks -Rad onc consult 7/2 as scheduled    All  questions were answered. The patient knows to call the clinic with any problems, questions or concerns. No barriers to learning were detected.   Santiago Glad, NP-C 01/13/2023

## 2023-01-13 ENCOUNTER — Inpatient Hospital Stay: Payer: Medicare Other

## 2023-01-13 ENCOUNTER — Inpatient Hospital Stay (HOSPITAL_BASED_OUTPATIENT_CLINIC_OR_DEPARTMENT_OTHER): Payer: Medicare Other | Admitting: Nurse Practitioner

## 2023-01-13 ENCOUNTER — Other Ambulatory Visit: Payer: Self-pay

## 2023-01-13 ENCOUNTER — Encounter: Payer: Self-pay | Admitting: Nurse Practitioner

## 2023-01-13 ENCOUNTER — Telehealth: Payer: Self-pay

## 2023-01-13 DIAGNOSIS — Z7952 Long term (current) use of systemic steroids: Secondary | ICD-10-CM | POA: Diagnosis not present

## 2023-01-13 DIAGNOSIS — Z79624 Long term (current) use of inhibitors of nucleotide synthesis: Secondary | ICD-10-CM | POA: Diagnosis not present

## 2023-01-13 DIAGNOSIS — Z170 Estrogen receptor positive status [ER+]: Secondary | ICD-10-CM | POA: Diagnosis not present

## 2023-01-13 DIAGNOSIS — Z95828 Presence of other vascular implants and grafts: Secondary | ICD-10-CM

## 2023-01-13 DIAGNOSIS — G7000 Myasthenia gravis without (acute) exacerbation: Secondary | ICD-10-CM | POA: Diagnosis not present

## 2023-01-13 DIAGNOSIS — G620 Drug-induced polyneuropathy: Secondary | ICD-10-CM | POA: Diagnosis not present

## 2023-01-13 DIAGNOSIS — Z7982 Long term (current) use of aspirin: Secondary | ICD-10-CM | POA: Diagnosis not present

## 2023-01-13 DIAGNOSIS — Z5111 Encounter for antineoplastic chemotherapy: Secondary | ICD-10-CM | POA: Diagnosis not present

## 2023-01-13 DIAGNOSIS — C50412 Malignant neoplasm of upper-outer quadrant of left female breast: Secondary | ICD-10-CM | POA: Diagnosis not present

## 2023-01-13 DIAGNOSIS — Z79899 Other long term (current) drug therapy: Secondary | ICD-10-CM | POA: Diagnosis not present

## 2023-01-13 DIAGNOSIS — Z79620 Long term (current) use of immunosuppressive biologic: Secondary | ICD-10-CM | POA: Diagnosis not present

## 2023-01-13 DIAGNOSIS — Z791 Long term (current) use of non-steroidal anti-inflammatories (NSAID): Secondary | ICD-10-CM | POA: Diagnosis not present

## 2023-01-13 DIAGNOSIS — Z794 Long term (current) use of insulin: Secondary | ICD-10-CM | POA: Diagnosis not present

## 2023-01-13 DIAGNOSIS — Z7984 Long term (current) use of oral hypoglycemic drugs: Secondary | ICD-10-CM | POA: Diagnosis not present

## 2023-01-13 DIAGNOSIS — T451X5A Adverse effect of antineoplastic and immunosuppressive drugs, initial encounter: Secondary | ICD-10-CM | POA: Diagnosis not present

## 2023-01-13 LAB — CMP (CANCER CENTER ONLY)
ALT: 25 U/L (ref 0–44)
AST: 26 U/L (ref 15–41)
Albumin: 3.7 g/dL (ref 3.5–5.0)
Alkaline Phosphatase: 150 U/L — ABNORMAL HIGH (ref 38–126)
Anion gap: 9 (ref 5–15)
BUN: 21 mg/dL (ref 8–23)
CO2: 19 mmol/L — ABNORMAL LOW (ref 22–32)
Calcium: 8.9 mg/dL (ref 8.9–10.3)
Chloride: 112 mmol/L — ABNORMAL HIGH (ref 98–111)
Creatinine: 1.13 mg/dL — ABNORMAL HIGH (ref 0.44–1.00)
GFR, Estimated: 53 mL/min — ABNORMAL LOW (ref >60)
Glucose, Bld: 211 mg/dL — ABNORMAL HIGH (ref 70–99)
Potassium: 3.3 mmol/L — ABNORMAL LOW (ref 3.5–5.1)
Sodium: 140 mmol/L (ref 135–145)
Total Bilirubin: 0.3 mg/dL (ref 0.3–1.2)
Total Protein: 6.6 g/dL (ref 6.5–8.1)

## 2023-01-13 LAB — CBC WITH DIFFERENTIAL (CANCER CENTER ONLY)
Abs Immature Granulocytes: 0.03 10*3/uL (ref 0.00–0.07)
Basophils Absolute: 0 10*3/uL (ref 0.0–0.1)
Basophils Relative: 1 %
Eosinophils Absolute: 0.1 10*3/uL (ref 0.0–0.5)
Eosinophils Relative: 2 %
HCT: 27.5 % — ABNORMAL LOW (ref 36.0–46.0)
Hemoglobin: 9.2 g/dL — ABNORMAL LOW (ref 12.0–15.0)
Immature Granulocytes: 1 %
Lymphocytes Relative: 21 %
Lymphs Abs: 1.3 10*3/uL (ref 0.7–4.0)
MCH: 31.5 pg (ref 26.0–34.0)
MCHC: 33.5 g/dL (ref 30.0–36.0)
MCV: 94.2 fL (ref 80.0–100.0)
Monocytes Absolute: 0.6 10*3/uL (ref 0.1–1.0)
Monocytes Relative: 10 %
Neutro Abs: 4.1 10*3/uL (ref 1.7–7.7)
Neutrophils Relative %: 65 %
Platelet Count: 238 10*3/uL (ref 150–400)
RBC: 2.92 MIL/uL — ABNORMAL LOW (ref 3.87–5.11)
RDW: 14.2 % (ref 11.5–15.5)
WBC Count: 6.1 10*3/uL (ref 4.0–10.5)
nRBC: 0 % (ref 0.0–0.2)

## 2023-01-13 MED ORDER — SODIUM CHLORIDE 0.9% FLUSH
10.0000 mL | Freq: Once | INTRAVENOUS | Status: AC
  Administered 2023-01-13: 10 mL

## 2023-01-13 MED ORDER — PACLITAXEL PROTEIN-BOUND CHEMO INJECTION 100 MG
80.0000 mg/m2 | Freq: Once | INTRAVENOUS | Status: AC
  Administered 2023-01-13: 150 mg via INTRAVENOUS
  Filled 2023-01-13: qty 30

## 2023-01-13 MED ORDER — PROCHLORPERAZINE MALEATE 10 MG PO TABS
10.0000 mg | ORAL_TABLET | Freq: Once | ORAL | Status: AC
  Administered 2023-01-13: 10 mg via ORAL
  Filled 2023-01-13: qty 1

## 2023-01-13 MED ORDER — SODIUM CHLORIDE 0.9 % IV SOLN: Freq: Once | INTRAVENOUS | Status: AC

## 2023-01-13 NOTE — Telephone Encounter (Signed)
Pt called wanting to know if her port was heparin locked today when she finished her chemo treatment.  Pt stated her infusion nurse was Carley Hammed today.  Reviewed pt's chart and saw on IV Flowsheet that pt's Port-a-Cath was flushed, blood return seen, and heparin locked.  Informed pt that "Yes" her port was heparin locked.  Pt said "OK" and had no further questions or concerns.

## 2023-01-13 NOTE — Patient Instructions (Signed)
[facility]  Discharge Instructions: Thank you for choosing [facility] to provide your oncology and hematology care.   If you have a lab appointment with the Cancer Center, please go directly to the Cancer Center and check in at the registration area.   Wear comfortable clothing and clothing appropriate for easy access to any Portacath or PICC line.   We strive to give you quality time with your provider. You may need to reschedule your appointment if you arrive late (15 or more minutes).  Arriving late affects you and other patients whose appointments are after yours.  Also, if you miss three or more appointments without notifying the office, you may be dismissed from the clinic at the provider's discretion.      For prescription refill requests, have your pharmacy contact our office and allow 72 hours for refills to be completed.    Today you received the following chemotherapy and/or immunotherapy agents: Abraxane      To help prevent nausea and vomiting after your treatment, we encourage you to take your nausea medication as directed.  BELOW ARE SYMPTOMS THAT SHOULD BE REPORTED IMMEDIATELY: *FEVER GREATER THAN 100.4 F (38 C) OR HIGHER *CHILLS OR SWEATING *NAUSEA AND VOMITING THAT IS NOT CONTROLLED WITH YOUR NAUSEA MEDICATION *UNUSUAL SHORTNESS OF BREATH *UNUSUAL BRUISING OR BLEEDING *URINARY PROBLEMS (pain or burning when urinating, or frequent urination) *BOWEL PROBLEMS (unusual diarrhea, constipation, pain near the anus) TENDERNESS IN MOUTH AND THROAT WITH OR WITHOUT PRESENCE OF ULCERS (sore throat, sores in mouth, or a toothache) UNUSUAL RASH, SWELLING OR PAIN  UNUSUAL VAGINAL DISCHARGE OR ITCHING   Items with * indicate a potential emergency and should be followed up as soon as possible or go to the Emergency Department if any problems should occur.  Please show the CHEMOTHERAPY ALERT CARD or IMMUNOTHERAPY ALERT CARD at  check-in to the Emergency Department and triage nurse.  Should you have questions after your visit or need to cancel or reschedule your appointment, please contact [facility]  Dept: [phone]  and follow the prompts.  Office hours are 8:00 a.m. to 4:30 p.m. Monday - Friday. Please note that voicemails left after 4:00 p.m. may not be returned until the following business day.  We are closed weekends and major holidays. You have access to a nurse at all times for urgent questions. Please call the main number to the clinic Dept: [phone] and follow the prompts.   For any non-urgent questions, you may also contact your provider using MyChart. We now offer e-Visits for anyone 18 and older to request care online for non-urgent symptoms. For details visit [email or web address].   Also download the MyChart app! Go to the app store, search "MyChart", open the app, select [facility], and log in with your MyChart username and password.   

## 2023-01-19 DIAGNOSIS — E1165 Type 2 diabetes mellitus with hyperglycemia: Secondary | ICD-10-CM | POA: Diagnosis not present

## 2023-01-19 DIAGNOSIS — E7800 Pure hypercholesterolemia, unspecified: Secondary | ICD-10-CM | POA: Diagnosis not present

## 2023-01-19 DIAGNOSIS — I10 Essential (primary) hypertension: Secondary | ICD-10-CM | POA: Diagnosis not present

## 2023-01-19 DIAGNOSIS — E559 Vitamin D deficiency, unspecified: Secondary | ICD-10-CM | POA: Diagnosis not present

## 2023-01-19 DIAGNOSIS — E890 Postprocedural hypothyroidism: Secondary | ICD-10-CM | POA: Diagnosis not present

## 2023-01-20 ENCOUNTER — Inpatient Hospital Stay: Payer: Medicare Other

## 2023-01-20 ENCOUNTER — Other Ambulatory Visit: Payer: Self-pay

## 2023-01-20 VITALS — BP 161/73 | HR 98 | Temp 98.5°F | Resp 20 | Ht 63.0 in | Wt 195.0 lb

## 2023-01-20 DIAGNOSIS — Z794 Long term (current) use of insulin: Secondary | ICD-10-CM | POA: Diagnosis not present

## 2023-01-20 DIAGNOSIS — Z7952 Long term (current) use of systemic steroids: Secondary | ICD-10-CM | POA: Diagnosis not present

## 2023-01-20 DIAGNOSIS — Z7982 Long term (current) use of aspirin: Secondary | ICD-10-CM | POA: Diagnosis not present

## 2023-01-20 DIAGNOSIS — Z170 Estrogen receptor positive status [ER+]: Secondary | ICD-10-CM

## 2023-01-20 DIAGNOSIS — Z7984 Long term (current) use of oral hypoglycemic drugs: Secondary | ICD-10-CM | POA: Diagnosis not present

## 2023-01-20 DIAGNOSIS — Z79620 Long term (current) use of immunosuppressive biologic: Secondary | ICD-10-CM | POA: Diagnosis not present

## 2023-01-20 DIAGNOSIS — T451X5A Adverse effect of antineoplastic and immunosuppressive drugs, initial encounter: Secondary | ICD-10-CM | POA: Diagnosis not present

## 2023-01-20 DIAGNOSIS — G620 Drug-induced polyneuropathy: Secondary | ICD-10-CM | POA: Diagnosis not present

## 2023-01-20 DIAGNOSIS — C50412 Malignant neoplasm of upper-outer quadrant of left female breast: Secondary | ICD-10-CM | POA: Diagnosis not present

## 2023-01-20 DIAGNOSIS — Z791 Long term (current) use of non-steroidal anti-inflammatories (NSAID): Secondary | ICD-10-CM | POA: Diagnosis not present

## 2023-01-20 DIAGNOSIS — Z79899 Other long term (current) drug therapy: Secondary | ICD-10-CM | POA: Diagnosis not present

## 2023-01-20 DIAGNOSIS — G7000 Myasthenia gravis without (acute) exacerbation: Secondary | ICD-10-CM | POA: Diagnosis not present

## 2023-01-20 DIAGNOSIS — Z79624 Long term (current) use of inhibitors of nucleotide synthesis: Secondary | ICD-10-CM | POA: Diagnosis not present

## 2023-01-20 DIAGNOSIS — Z5111 Encounter for antineoplastic chemotherapy: Secondary | ICD-10-CM | POA: Diagnosis not present

## 2023-01-20 DIAGNOSIS — Z95828 Presence of other vascular implants and grafts: Secondary | ICD-10-CM

## 2023-01-20 LAB — CBC WITH DIFFERENTIAL (CANCER CENTER ONLY)
Abs Immature Granulocytes: 0.06 10*3/uL (ref 0.00–0.07)
Basophils Absolute: 0.1 10*3/uL (ref 0.0–0.1)
Basophils Relative: 1 %
Eosinophils Absolute: 0.2 10*3/uL (ref 0.0–0.5)
Eosinophils Relative: 2 %
HCT: 26.5 % — ABNORMAL LOW (ref 36.0–46.0)
Hemoglobin: 8.9 g/dL — ABNORMAL LOW (ref 12.0–15.0)
Immature Granulocytes: 1 %
Lymphocytes Relative: 25 %
Lymphs Abs: 1.6 10*3/uL (ref 0.7–4.0)
MCH: 31.6 pg (ref 26.0–34.0)
MCHC: 33.6 g/dL (ref 30.0–36.0)
MCV: 94 fL (ref 80.0–100.0)
Monocytes Absolute: 0.5 10*3/uL (ref 0.1–1.0)
Monocytes Relative: 8 %
Neutro Abs: 4 10*3/uL (ref 1.7–7.7)
Neutrophils Relative %: 63 %
Platelet Count: 267 10*3/uL (ref 150–400)
RBC: 2.82 MIL/uL — ABNORMAL LOW (ref 3.87–5.11)
RDW: 13.7 % (ref 11.5–15.5)
WBC Count: 6.4 10*3/uL (ref 4.0–10.5)
nRBC: 0 % (ref 0.0–0.2)

## 2023-01-20 LAB — CMP (CANCER CENTER ONLY)
ALT: 29 U/L (ref 0–44)
AST: 28 U/L (ref 15–41)
Albumin: 3.9 g/dL (ref 3.5–5.0)
Alkaline Phosphatase: 161 U/L — ABNORMAL HIGH (ref 38–126)
Anion gap: 8 (ref 5–15)
BUN: 20 mg/dL (ref 8–23)
CO2: 23 mmol/L (ref 22–32)
Calcium: 9.2 mg/dL (ref 8.9–10.3)
Chloride: 110 mmol/L (ref 98–111)
Creatinine: 1.01 mg/dL — ABNORMAL HIGH (ref 0.44–1.00)
GFR, Estimated: >60 mL/min (ref >60)
Glucose, Bld: 192 mg/dL — ABNORMAL HIGH (ref 70–99)
Potassium: 3.6 mmol/L (ref 3.5–5.1)
Sodium: 141 mmol/L (ref 135–145)
Total Bilirubin: 0.3 mg/dL (ref 0.3–1.2)
Total Protein: 7 g/dL (ref 6.5–8.1)

## 2023-01-20 MED ORDER — SODIUM CHLORIDE 0.9% FLUSH
10.0000 mL | Freq: Once | INTRAVENOUS | Status: AC
  Administered 2023-01-20: 10 mL

## 2023-01-20 MED ORDER — PACLITAXEL PROTEIN-BOUND CHEMO INJECTION 100 MG
80.0000 mg/m2 | Freq: Once | INTRAVENOUS | Status: AC
  Administered 2023-01-20: 150 mg via INTRAVENOUS
  Filled 2023-01-20: qty 30

## 2023-01-20 MED ORDER — PROCHLORPERAZINE MALEATE 10 MG PO TABS
10.0000 mg | ORAL_TABLET | Freq: Once | ORAL | Status: AC
  Administered 2023-01-20: 10 mg via ORAL
  Filled 2023-01-20: qty 1

## 2023-01-20 MED ORDER — SODIUM CHLORIDE 0.9% FLUSH
10.0000 mL | INTRAVENOUS | Status: DC | PRN
  Administered 2023-01-20: 10 mL

## 2023-01-20 MED ORDER — HEPARIN SOD (PORK) LOCK FLUSH 100 UNIT/ML IV SOLN
500.0000 [IU] | Freq: Once | INTRAVENOUS | Status: AC | PRN
  Administered 2023-01-20: 500 [IU]

## 2023-01-20 MED ORDER — SODIUM CHLORIDE 0.9 % IV SOLN: Freq: Once | INTRAVENOUS | Status: AC

## 2023-01-20 NOTE — Patient Instructions (Signed)
[facility]  Discharge Instructions: Thank you for choosing [facility] to provide your oncology and hematology care.   If you have a lab appointment with the Cancer Center, please go directly to the Cancer Center and check in at the registration area.   Wear comfortable clothing and clothing appropriate for easy access to any Portacath or PICC line.   We strive to give you quality time with your provider. You may need to reschedule your appointment if you arrive late (15 or more minutes).  Arriving late affects you and other patients whose appointments are after yours.  Also, if you miss three or more appointments without notifying the office, you may be dismissed from the clinic at the provider's discretion.      For prescription refill requests, have your pharmacy contact our office and allow 72 hours for refills to be completed.    Today you received the following chemotherapy and/or immunotherapy agents: Abraxane      To help prevent nausea and vomiting after your treatment, we encourage you to take your nausea medication as directed.  BELOW ARE SYMPTOMS THAT SHOULD BE REPORTED IMMEDIATELY: *FEVER GREATER THAN 100.4 F (38 C) OR HIGHER *CHILLS OR SWEATING *NAUSEA AND VOMITING THAT IS NOT CONTROLLED WITH YOUR NAUSEA MEDICATION *UNUSUAL SHORTNESS OF BREATH *UNUSUAL BRUISING OR BLEEDING *URINARY PROBLEMS (pain or burning when urinating, or frequent urination) *BOWEL PROBLEMS (unusual diarrhea, constipation, pain near the anus) TENDERNESS IN MOUTH AND THROAT WITH OR WITHOUT PRESENCE OF ULCERS (sore throat, sores in mouth, or a toothache) UNUSUAL RASH, SWELLING OR PAIN  UNUSUAL VAGINAL DISCHARGE OR ITCHING   Items with * indicate a potential emergency and should be followed up as soon as possible or go to the Emergency Department if any problems should occur.  Please show the CHEMOTHERAPY ALERT CARD or IMMUNOTHERAPY ALERT CARD at  check-in to the Emergency Department and triage nurse.  Should you have questions after your visit or need to cancel or reschedule your appointment, please contact [facility]  Dept: [phone]  and follow the prompts.  Office hours are 8:00 a.m. to 4:30 p.m. Monday - Friday. Please note that voicemails left after 4:00 p.m. may not be returned until the following business day.  We are closed weekends and major holidays. You have access to a nurse at all times for urgent questions. Please call the main number to the clinic Dept: [phone] and follow the prompts.   For any non-urgent questions, you may also contact your provider using MyChart. We now offer e-Visits for anyone 18 and older to request care online for non-urgent symptoms. For details visit [email or web address].   Also download the MyChart app! Go to the app store, search "MyChart", open the app, select [facility], and log in with your MyChart username and password.   

## 2023-01-24 DIAGNOSIS — G4733 Obstructive sleep apnea (adult) (pediatric): Secondary | ICD-10-CM | POA: Diagnosis not present

## 2023-01-26 ENCOUNTER — Telehealth: Payer: Self-pay | Admitting: Hematology

## 2023-01-26 NOTE — Telephone Encounter (Signed)
Contacted patient to scheduled appointments. Patient is aware of appointments that are scheduled.   

## 2023-01-26 NOTE — Assessment & Plan Note (Signed)
-  secondary to chemo, her DM may contribute also -I called in gabapentin, she has not started yet.  She will continue cryotherapy during chemo, and use warm gloves when the blanket at night. -neuropathy is minimum now    

## 2023-01-26 NOTE — Assessment & Plan Note (Signed)
-  She is on Rituxan every 6 months, CellCept and prednisone.  will continue during chemo  -I previously discussed with her neurologist  

## 2023-01-26 NOTE — Assessment & Plan Note (Addendum)
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  I reduced her Abraxane dose to 60 mg/m and continue. She has been tolerating this well  -Given her excellent tolerance, I have increased her chemo dose to 80mg /m2, she is tolerating well overall

## 2023-01-27 ENCOUNTER — Encounter: Payer: Self-pay | Admitting: Hematology

## 2023-01-27 ENCOUNTER — Inpatient Hospital Stay: Payer: Medicare Other | Attending: Hematology

## 2023-01-27 ENCOUNTER — Inpatient Hospital Stay (HOSPITAL_BASED_OUTPATIENT_CLINIC_OR_DEPARTMENT_OTHER): Payer: Medicare Other | Admitting: Hematology

## 2023-01-27 ENCOUNTER — Telehealth: Payer: Self-pay | Admitting: Hematology

## 2023-01-27 ENCOUNTER — Inpatient Hospital Stay: Payer: Medicare Other

## 2023-01-27 VITALS — BP 121/62 | HR 102 | Resp 18

## 2023-01-27 VITALS — BP 170/80 | HR 101 | Temp 98.3°F | Ht 63.0 in | Wt 192.3 lb

## 2023-01-27 DIAGNOSIS — G620 Drug-induced polyneuropathy: Secondary | ICD-10-CM | POA: Diagnosis not present

## 2023-01-27 DIAGNOSIS — D6481 Anemia due to antineoplastic chemotherapy: Secondary | ICD-10-CM | POA: Insufficient documentation

## 2023-01-27 DIAGNOSIS — Z79899 Other long term (current) drug therapy: Secondary | ICD-10-CM | POA: Insufficient documentation

## 2023-01-27 DIAGNOSIS — Z791 Long term (current) use of non-steroidal anti-inflammatories (NSAID): Secondary | ICD-10-CM | POA: Diagnosis not present

## 2023-01-27 DIAGNOSIS — Z8673 Personal history of transient ischemic attack (TIA), and cerebral infarction without residual deficits: Secondary | ICD-10-CM | POA: Diagnosis not present

## 2023-01-27 DIAGNOSIS — C50412 Malignant neoplasm of upper-outer quadrant of left female breast: Secondary | ICD-10-CM

## 2023-01-27 DIAGNOSIS — Z7952 Long term (current) use of systemic steroids: Secondary | ICD-10-CM | POA: Diagnosis not present

## 2023-01-27 DIAGNOSIS — Z170 Estrogen receptor positive status [ER+]: Secondary | ICD-10-CM

## 2023-01-27 DIAGNOSIS — Z95828 Presence of other vascular implants and grafts: Secondary | ICD-10-CM

## 2023-01-27 DIAGNOSIS — Z7982 Long term (current) use of aspirin: Secondary | ICD-10-CM | POA: Diagnosis not present

## 2023-01-27 DIAGNOSIS — T451X5D Adverse effect of antineoplastic and immunosuppressive drugs, subsequent encounter: Secondary | ICD-10-CM | POA: Insufficient documentation

## 2023-01-27 DIAGNOSIS — Z79633 Long term (current) use of mitotic inhibitor: Secondary | ICD-10-CM | POA: Diagnosis not present

## 2023-01-27 DIAGNOSIS — G7000 Myasthenia gravis without (acute) exacerbation: Secondary | ICD-10-CM | POA: Diagnosis not present

## 2023-01-27 DIAGNOSIS — Z5111 Encounter for antineoplastic chemotherapy: Secondary | ICD-10-CM | POA: Diagnosis not present

## 2023-01-27 DIAGNOSIS — R5383 Other fatigue: Secondary | ICD-10-CM | POA: Diagnosis not present

## 2023-01-27 LAB — CBC WITH DIFFERENTIAL (CANCER CENTER ONLY)
Abs Immature Granulocytes: 0.05 10*3/uL (ref 0.00–0.07)
Basophils Absolute: 0 10*3/uL (ref 0.0–0.1)
Basophils Relative: 1 %
Eosinophils Absolute: 0.2 10*3/uL (ref 0.0–0.5)
Eosinophils Relative: 4 %
HCT: 26.6 % — ABNORMAL LOW (ref 36.0–46.0)
Hemoglobin: 8.6 g/dL — ABNORMAL LOW (ref 12.0–15.0)
Immature Granulocytes: 1 %
Lymphocytes Relative: 24 %
Lymphs Abs: 0.9 10*3/uL (ref 0.7–4.0)
MCH: 30.9 pg (ref 26.0–34.0)
MCHC: 32.3 g/dL (ref 30.0–36.0)
MCV: 95.7 fL (ref 80.0–100.0)
Monocytes Absolute: 0.4 10*3/uL (ref 0.1–1.0)
Monocytes Relative: 11 %
Neutro Abs: 2.1 10*3/uL (ref 1.7–7.7)
Neutrophils Relative %: 59 %
Platelet Count: 280 10*3/uL (ref 150–400)
RBC: 2.78 MIL/uL — ABNORMAL LOW (ref 3.87–5.11)
RDW: 14 % (ref 11.5–15.5)
WBC Count: 3.6 10*3/uL — ABNORMAL LOW (ref 4.0–10.5)
nRBC: 0.6 % — ABNORMAL HIGH (ref 0.0–0.2)

## 2023-01-27 LAB — CMP (CANCER CENTER ONLY)
ALT: 27 U/L (ref 0–44)
AST: 32 U/L (ref 15–41)
Albumin: 3.6 g/dL (ref 3.5–5.0)
Alkaline Phosphatase: 151 U/L — ABNORMAL HIGH (ref 38–126)
Anion gap: 9 (ref 5–15)
BUN: 22 mg/dL (ref 8–23)
CO2: 21 mmol/L — ABNORMAL LOW (ref 22–32)
Calcium: 9.1 mg/dL (ref 8.9–10.3)
Chloride: 110 mmol/L (ref 98–111)
Creatinine: 1.09 mg/dL — ABNORMAL HIGH (ref 0.44–1.00)
GFR, Estimated: 55 mL/min — ABNORMAL LOW (ref >60)
Glucose, Bld: 202 mg/dL — ABNORMAL HIGH (ref 70–99)
Potassium: 3.6 mmol/L (ref 3.5–5.1)
Sodium: 140 mmol/L (ref 135–145)
Total Bilirubin: 0.3 mg/dL (ref 0.3–1.2)
Total Protein: 8.3 g/dL — ABNORMAL HIGH (ref 6.5–8.1)

## 2023-01-27 MED ORDER — SODIUM CHLORIDE 0.9% FLUSH
10.0000 mL | INTRAVENOUS | Status: DC | PRN
  Administered 2023-01-27: 10 mL

## 2023-01-27 MED ORDER — HEPARIN SOD (PORK) LOCK FLUSH 100 UNIT/ML IV SOLN
500.0000 [IU] | Freq: Once | INTRAVENOUS | Status: AC | PRN
  Administered 2023-01-27: 500 [IU]

## 2023-01-27 MED ORDER — SODIUM CHLORIDE 0.9 % IV SOLN: Freq: Once | INTRAVENOUS | Status: AC

## 2023-01-27 MED ORDER — PROCHLORPERAZINE MALEATE 10 MG PO TABS
10.0000 mg | ORAL_TABLET | Freq: Once | ORAL | Status: AC
  Administered 2023-01-27: 10 mg via ORAL
  Filled 2023-01-27: qty 1

## 2023-01-27 MED ORDER — PACLITAXEL PROTEIN-BOUND CHEMO INJECTION 100 MG
80.0000 mg/m2 | Freq: Once | INTRAVENOUS | Status: AC
  Administered 2023-01-27: 150 mg via INTRAVENOUS
  Filled 2023-01-27: qty 30

## 2023-01-27 MED ORDER — SODIUM CHLORIDE 0.9% FLUSH
10.0000 mL | Freq: Once | INTRAVENOUS | Status: AC
  Administered 2023-01-27: 10 mL

## 2023-01-27 NOTE — Patient Instructions (Signed)
[facility]  Discharge Instructions: Thank you for choosing [facility] to provide your oncology and hematology care.   If you have a lab appointment with the Cancer Center, please go directly to the Cancer Center and check in at the registration area.   Wear comfortable clothing and clothing appropriate for easy access to any Portacath or PICC line.   We strive to give you quality time with your provider. You may need to reschedule your appointment if you arrive late (15 or more minutes).  Arriving late affects you and other patients whose appointments are after yours.  Also, if you miss three or more appointments without notifying the office, you may be dismissed from the clinic at the provider's discretion.      For prescription refill requests, have your pharmacy contact our office and allow 72 hours for refills to be completed.    Today you received the following chemotherapy and/or immunotherapy agents: Abraxane      To help prevent nausea and vomiting after your treatment, we encourage you to take your nausea medication as directed.  BELOW ARE SYMPTOMS THAT SHOULD BE REPORTED IMMEDIATELY: *FEVER GREATER THAN 100.4 F (38 C) OR HIGHER *CHILLS OR SWEATING *NAUSEA AND VOMITING THAT IS NOT CONTROLLED WITH YOUR NAUSEA MEDICATION *UNUSUAL SHORTNESS OF BREATH *UNUSUAL BRUISING OR BLEEDING *URINARY PROBLEMS (pain or burning when urinating, or frequent urination) *BOWEL PROBLEMS (unusual diarrhea, constipation, pain near the anus) TENDERNESS IN MOUTH AND THROAT WITH OR WITHOUT PRESENCE OF ULCERS (sore throat, sores in mouth, or a toothache) UNUSUAL RASH, SWELLING OR PAIN  UNUSUAL VAGINAL DISCHARGE OR ITCHING   Items with * indicate a potential emergency and should be followed up as soon as possible or go to the Emergency Department if any problems should occur.  Please show the CHEMOTHERAPY ALERT CARD or IMMUNOTHERAPY ALERT CARD at  check-in to the Emergency Department and triage nurse.  Should you have questions after your visit or need to cancel or reschedule your appointment, please contact [facility]  Dept: [phone]  and follow the prompts.  Office hours are 8:00 a.m. to 4:30 p.m. Monday - Friday. Please note that voicemails left after 4:00 p.m. may not be returned until the following business day.  We are closed weekends and major holidays. You have access to a nurse at all times for urgent questions. Please call the main number to the clinic Dept: [phone] and follow the prompts.   For any non-urgent questions, you may also contact your provider using MyChart. We now offer e-Visits for anyone 18 and older to request care online for non-urgent symptoms. For details visit [email or web address].   Also download the MyChart app! Go to the app store, search "MyChart", open the app, select [facility], and log in with your MyChart username and password.   

## 2023-01-27 NOTE — Telephone Encounter (Signed)
Patient is aware of upcoming appointment dates/times  

## 2023-01-27 NOTE — Progress Notes (Signed)
Patient seen by Dr. America Brown are within treatment parameters.  Labs reviewed: and are not all within treatment parameters. HG 8.6  Per physician team, patient is ready for treatment and there are NO modifications to the treatment plan.

## 2023-01-27 NOTE — Progress Notes (Signed)
[name]   [phone] [phone]   Clinic Follow up Note   Patient Care Team: Irena Reichmann, DO as PCP - General (Family Medicine) Micki Riley, MD as Consulting Physician (Neurology) Ihor Austin, NP as Nurse Practitioner (Neurology) Donnelly Angelica, RN as Oncology Nurse Navigator Pershing Proud, RN as Oncology Nurse Navigator Abigail Miyamoto, MD as Consulting Physician (General Surgery) Malachy Mood, MD as Consulting Physician (Hematology) Dorothy Puffer, MD as Consulting Physician (Radiation Oncology) Dianne Dun, MD as Consulting Physician (Rheumatology)  Date of Service:  01/27/2023  CHIEF COMPLAINT: f/u of left breast cancer     CURRENT THERAPY:  Adjuvant chemotherapy with Abraxane weekly (2 weeks on and one week off) for 12 treatments, starting 10/21/22    ASSESSMENT:  [name] is a 72 y.o. 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  I reduced her Abraxane dose to 60 mg/m and continue. She has been tolerating this well  -Given her good tolerance, I have increased her chemo dose to 80mg /m2, she is tolerating well overall except fatigue. -Lab reviewed, adequate for treatment, will proceed to last dose chemo today. -she is scheduled for f/u with rad/onc to start adjuvant radiation in 4 weeks.  Myasthenia gravis (HCC) -She is on Rituxan every 6 months, CellCept and prednisone.  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  She will continue cryotherapy during chemo, and use warm gloves when the blanket at night. -neuropathy is mild and stable   Fatigue and anemia -Secondary to chemotherapy -Lab reviewed, hemoglobin 8.6, given this is last dose of chemotherapy, I will hold blood transfusion.    PLAN: -lab reviewed hg 8.6 -Pt will take a 1 month break before starting radiation. -Encourage pt to eat more lien meat -proceed with last cycle of Abraxane today -will discuss Antiestrogen next f/u visit after radiation -lab/flush and f/u in 6 weeks   SUMMARY OF ONCOLOGIC HISTORY: Oncology History Overview Note   Cancer Staging  Malignant neoplasm of upper-outer quadrant of left breast in female, estrogen receptor positive (HCC) Staging form: Breast, AJCC 8th Edition - Clinical: Stage IB (cT1b, cN0, cM0, G3, ER+, PR-, HER2-) - Signed by Ronny Bacon, PA-C on 08/10/2022 Stage prefix: Initial diagnosis Method of lymph node assessment: Clinical Histologic grading system: 3 grade system - Pathologic stage from 09/02/2022: Stage IB (pT1b, pN0, cM0, G2, ER-, PR-, HER2-) - Signed by Malachy Mood, MD on 09/23/2022 Stage prefix: Initial diagnosis Histologic grading system: 3 grade system Residual tumor (R): R0 - None     Malignant neoplasm of upper-outer quadrant of left breast in female, estrogen receptor positive (HCC)  08/10/2022 Initial Diagnosis   Malignant neoplasm of upper-outer quadrant of left female breast (HCC)   08/10/2022 Cancer Staging   Staging form: Breast, AJCC 8th Edition - Clinical: Stage IB (cT1b, cN0, cM0, G3, ER+,  PR-, HER2-) - Signed by Ronny Bacon, PA-C on 08/10/2022 Stage prefix: Initial diagnosis Method of lymph node assessment: Clinical Histologic grading system: 3 grade system    09/02/2022 Cancer Staging   Staging form: Breast, AJCC 8th Edition - Pathologic stage from 09/02/2022: Stage IB (pT1b, pN0, cM0, G2, ER-, PR-, HER2-) - Signed by Malachy Mood, MD on 09/23/2022 Stage prefix: Initial diagnosis Histologic grading system: 3 grade system Residual tumor (R): R0 - None   10/21/2022 -  Chemotherapy   Patient is on Treatment Plan : BREAST PACLitaxel-Albumin (Abraxane) (100) D1,8,15 q28d        INTERVAL HISTORY:  [name] is here for a follow up of left breast cancer . She was last seen by NP Lacie on 01/13/2023. She presents to the clinic accompanied by sister. Pt sate that she is very fatigue. Pt also state that she has some numbness and tingling on the right hand. Pt state that her left leg has been bothering her. Pt state that she had diarrhea up to 3-4 times a day right after she eats. Pt took imodium for the symptoms.     All other systems were reviewed with the patient and are negative.  MEDICAL HISTORY:  Past Medical History:  Diagnosis Date   Arthritis    "all over my body" (03/13/2013)   Asthma    Asymptomatic carotid artery stenosis, bilateral 10/08/2018   Breast cancer (HCC)    GERD (gastroesophageal reflux disease)    H/O hiatal hernia    Headache    pt states she has had headaches for about 6 months "off and on"   Heart murmur    pt had echocardiogram on 09/17/21   Hypercholesterolemia 10/08/2018   Hypertension    Hypothyroidism    Myasthenia gravis (HCC)    "in my eyes; diagnsosed > 7 yr ago" (03/13/2013)   Sleep apnea    on CPAP   Stroke (HCC) 2021   Thyroid carcinoma (HCC)    Type II diabetes mellitus (HCC)     SURGICAL HISTORY: Past Surgical History:  Procedure Laterality Date   ABDOMINAL HYSTERECTOMY     ANTERIOR CERVICAL DECOMP/DISCECTOMY FUSION     "I've had severa ORs; always went in from the front" (03/13/2013)   APPENDECTOMY     BREAST LUMPECTOMY WITH RADIOACTIVE SEED AND SENTINEL LYMPH NODE BIOPSY Left 09/02/2022    Procedure: LEFT BREAST LUMPECTOMY WITH RADIOACTIVE SEED AND SENTINEL LYMPH NODE BIOPSY;  Surgeon: Abigail Miyamoto, MD;  Location: [facility] OR;  Service: General;  Laterality: Left;   CARDIAC CATHETERIZATION     "several" (03/13/2013)   CARPAL TUNNEL RELEASE Right    CATARACT EXTRACTION W/ INTRAOCULAR LENS  IMPLANT, BILATERAL Bilateral    CHOLECYSTECTOMY     KNEE ARTHROSCOPY Left    SHOULDER ARTHROSCOPY W/ ROTATOR CUFF REPAIR Left    TONSILLECTOMY     TOTAL THYROIDECTOMY     TRANSCAROTID ARTERY REVASCULARIZATION  Left 09/24/2021   Procedure: LEFT TRANSCAROTID ARTERY REVASCULARIZATION;  Surgeon: Leonie Douglas, MD;  Location: [facility] OR;  Service: Vascular;  Laterality: Left;   TRANSCAROTID ARTERY REVASCULARIZATION  Right 10/23/2021   Procedure: Right Transcarotid Artery Revascularization;  Surgeon: Leonie Douglas, MD;  Location: [facility] OR;  Service: Vascular;  Laterality: Right;   ULTRASOUND GUIDANCE FOR VASCULAR ACCESS Right 09/24/2021   Procedure: ULTRASOUND GUIDANCE FOR VASCULAR ACCESS;  Surgeon: Leonie Douglas, MD;  Location: [facility] OR;  Service: Vascular;  Laterality: Right;   ULTRASOUND GUIDANCE FOR VASCULAR ACCESS Right  10/23/2021   Procedure: ULTRASOUND GUIDANCE FOR VASCULAR ACCESS, LEFT FEMORAL VEIN;  Surgeon: Leonie Douglas, MD;  Location: [facility] OR;  Service: Vascular;  Laterality: Right;    I have reviewed the social history and family history with the patient and they are unchanged from previous note.  ALLERGIES:  is allergic to contrast media [iodinated contrast media], fluorescein, iodine, molds & smuts, shellfish-derived products, pholcodine, azithromycin, codeine, metformin hcl er, oxycodone, tramadol hcl, and other.  MEDICATIONS:  Current Outpatient Medications  Medication Sig Dispense Refill   acetaminophen (TYLENOL) 500 MG tablet Take 500 mg by mouth every 6 (six) hours as needed for moderate pain.     aspirin EC 81 MG EC tablet Take 1 tablet (81 mg total) by mouth daily.     Biotin 5000  MCG CAPS Take 5,000 mcg by mouth daily with breakfast.      cycloSPORINE (RESTASIS) 0.05 % ophthalmic emulsion Place 1 drop into both eyes 2 (two) times daily. 0.4 mL 0   diclofenac Sodium (VOLTAREN) 1 % GEL Apply 2 g topically 4 (four) times daily. (Patient taking differently: Apply 1 application  topically daily as needed (pain).) 2 g 0   diphenhydrAMINE 25 mg in sodium chloride 0.9 % 50 mL Inject 25 mg into the vein See admin instructions. Administer 25 mg intravenously at start of Gamunex infusion, then administer 25 mg during infusion     diphenoxylate-atropine (LOMOTIL) 2.5-0.025 MG tablet Take 1-2 tablets by mouth 4 (four) times daily as needed for diarrhea or loose stools. 30 tablet 2   ELDERBERRY PO Take 1 tablet by mouth daily.     empagliflozin (JARDIANCE) 25 MG TABS tablet Take 25 mg by mouth daily.     esomeprazole (NEXIUM) 40 MG capsule Take 1 capsule (40 mg total) by mouth 2 (two) times daily. 60 capsule 0   Evolocumab with Infusor (REPATHA PUSHTRONEX SYSTEM) 420 MG/3.5ML SOCT Inject 420 mg into the skin every 30 (thirty) days.     Exenatide ER (BYDUREON BCISE) 2 MG/0.85ML AUIJ Inject 2 mg into the skin every Monday.     ezetimibe (ZETIA) 10 MG tablet TAKE 1 TABLET BY MOUTH EVERY DAY 90 tablet 1   furosemide (LASIX) 40 MG tablet Take 40 mg by mouth daily as needed for edema.     gabapentin (NEURONTIN) 100 MG capsule Take 1 capsule (100 mg total) by mouth at bedtime. Increase to 2 cap at night in 2 weeks if needed 30 capsule 0   Immune Globulin, Human, 40 GM/400ML SOLN Inject into the vein every 6 (six) weeks. Infusions are administered at home by home health nurse - last infusion 09/08/21 and 09/09/21; next infusion due 10/19/21 and 10/20/21     insulin degludec (TRESIBA FLEXTOUCH) 100 UNIT/ML FlexTouch Pen Inject 15 Units into the skin in the morning.     Insulin Disposable Pump (OMNIPOD DASH 5 PACK PODS) MISC Use with Humalog     insulin lispro (HUMALOG) 100 UNIT/ML injection Inject into  the skin See admin instructions. Inject subcutaneously 3 (three) times daily with meals With meals through the Omnipod Dash Pump every 72 hours as directed     levothyroxine (SYNTHROID) 175 MCG tablet Take 175 mcg by mouth daily before breakfast.     lidocaine-prilocaine (EMLA) cream Apply to affected area once 30 g 3   loperamide (IMODIUM A-D) 2 MG tablet Take 2 mg by mouth 4 (four) times daily as needed for diarrhea or loose stools.     loratadine (CLARITIN) 10  MG tablet Take 1 tablet (10 mg total) by mouth at bedtime. 30 tablet 0   meclizine (ANTIVERT) 25 MG tablet Take 25 mg by mouth 3 (three) times daily as needed for dizziness or nausea (or migraine-related nausea).     montelukast (SINGULAIR) 10 MG tablet Take 1 tablet (10 mg total) by mouth every morning. 30 tablet 0   mycophenolate (CELLCEPT) 500 MG tablet Take 1,000 mg by mouth 2 (two) times daily. For myasthenia gravis     olmesartan (BENICAR) 20 MG tablet Take 1 tablet (20 mg total) by mouth daily. 30 tablet 0   ondansetron (ZOFRAN) 8 MG tablet Take 1 tablet (8 mg total) by mouth every 8 (eight) hours as needed for nausea or vomiting. 30 tablet 1   predniSONE (DELTASONE) 2.5 MG tablet Take 7.5 mg by mouth every morning. For myasthenia gravis     PRESCRIPTION MEDICATION Pt uses a cpap nightly     PROAIR HFA 108 (90 BASE) MCG/ACT inhaler Inhale 2 puffs into the lungs every 6 (six) hours as needed for wheezing or shortness of breath.      prochlorperazine (COMPAZINE) 10 MG tablet Take 1 tablet (10 mg total) by mouth every 6 (six) hours as needed for nausea or vomiting. 30 tablet 1   riTUXimab (RITUXAN) 500 MG/50ML injection Inject into the vein every 6 (six) months.     rosuvastatin (CRESTOR) 40 MG tablet Take 1 tablet (40 mg total) by mouth daily. 90 tablet 3   topiramate (TOPAMAX) 50 MG tablet Take 1 tablet (50 mg total) by mouth at bedtime. 90 tablet 3   traMADol (ULTRAM) 50 MG tablet Take 1 tablet (50 mg total) by mouth every 6 (six)  hours as needed for moderate pain. 25 tablet 0   vitamin B-12 (CYANOCOBALAMIN) 1000 MCG tablet Take 1,000 mcg by mouth daily.     Vitamin D, Ergocalciferol, (DRISDOL) 50000 UNITS CAPS capsule Take 50,000 Units by mouth every Monday.      No current facility-administered medications for this visit.   Facility-Administered Medications Ordered in Other Visits  Medication Dose Route Frequency Provider Last Rate Last Admin   heparin lock flush 100 unit/mL  500 Units Intracatheter Once PRN Malachy Mood, MD       PACLitaxel-protein bound (ABRAXANE) chemo infusion 150 mg  80 mg/m2 (Treatment Plan Recorded) Intravenous Once Malachy Mood, MD 60 mL/hr at 01/27/23 1027 150 mg at 01/27/23 1027   sodium chloride flush (NS) 0.9 % injection 10 mL  10 mL Intracatheter PRN Malachy Mood, MD        PHYSICAL EXAMINATION: ECOG PERFORMANCE STATUS: 2 - Symptomatic, <50% confined to bed  Vitals:   01/27/23 0820  BP: (!) 170/80  Pulse: (!) 101  Temp: 98.3 F (36.8 C)  SpO2: 100%   Wt Readings from Last 3 Encounters:  01/27/23 192 lb 4.8 oz (87.2 kg)  01/20/23 195 lb (88.5 kg)  01/13/23 191 lb 11.2 oz (87 kg)     GENERAL:alert, no distress and comfortable SKIN: skin color normal, no rashes or significant lesions EYES: normal, Conjunctiva are pink and non-injected, sclera clear  NEURO: alert & oriented x 3 with fluent speech Lower leg extremity edema/  LABORATORY DATA:  I have reviewed the data as listed    Latest Ref Rng & Units 01/27/2023    8:01 AM 01/20/2023    8:02 AM 01/13/2023    8:13 AM  CBC  WBC 4.0 - 10.5 K/uL 3.6  6.4  6.1   Hemoglobin  12.0 - 15.0 g/dL 8.6  8.9  9.2   Hematocrit 36.0 - 46.0 % 26.6  26.5  27.5   Platelets 150 - 400 K/uL 280  267  238         Latest Ref Rng & Units 01/27/2023    8:01 AM 01/20/2023    8:02 AM 01/13/2023    8:13 AM  CMP  Glucose 70 - 99 mg/dL 782  956  213   BUN 8 - 23 mg/dL 22  20  21    Creatinine 0.44 - 1.00 mg/dL 0.86  5.78  4.69   Sodium 135 - 145 mmol/L  140  141  140   Potassium 3.5 - 5.1 mmol/L 3.6  3.6  3.3   Chloride 98 - 111 mmol/L 110  110  112   CO2 22 - 32 mmol/L 21  23  19    Calcium 8.9 - 10.3 mg/dL 9.1  9.2  8.9   Total Protein 6.5 - 8.1 g/dL 8.3  7.0  6.6   Total Bilirubin 0.3 - 1.2 mg/dL 0.3  0.3  0.3   Alkaline Phos 38 - 126 U/L 151  161  150   AST 15 - 41 U/L 32  28  26   ALT 0 - 44 U/L 27  29  25        RADIOGRAPHIC STUDIES: I have personally reviewed the radiological images as listed and agreed with the findings in the report. No results found.    No orders of the defined types were placed in this encounter.  All questions were answered. The patient knows to call the clinic with any problems, questions or concerns. No barriers to learning was detected. The total time spent in the appointment was 25 minutes.     Malachy Mood, MD 01/27/2023   Carolin Coy, CMA, am acting as scribe for Malachy Mood, MD.   I have reviewed the above documentation for accuracy and completeness, and I agree with the above.

## 2023-01-28 ENCOUNTER — Encounter: Payer: Self-pay | Admitting: *Deleted

## 2023-01-28 DIAGNOSIS — E890 Postprocedural hypothyroidism: Secondary | ICD-10-CM | POA: Diagnosis not present

## 2023-01-28 DIAGNOSIS — E559 Vitamin D deficiency, unspecified: Secondary | ICD-10-CM | POA: Diagnosis not present

## 2023-01-28 DIAGNOSIS — C73 Malignant neoplasm of thyroid gland: Secondary | ICD-10-CM | POA: Diagnosis not present

## 2023-01-28 DIAGNOSIS — I10 Essential (primary) hypertension: Secondary | ICD-10-CM | POA: Diagnosis not present

## 2023-01-28 DIAGNOSIS — E7800 Pure hypercholesterolemia, unspecified: Secondary | ICD-10-CM | POA: Diagnosis not present

## 2023-01-28 DIAGNOSIS — Z9641 Presence of insulin pump (external) (internal): Secondary | ICD-10-CM | POA: Diagnosis not present

## 2023-01-28 DIAGNOSIS — E1165 Type 2 diabetes mellitus with hyperglycemia: Secondary | ICD-10-CM | POA: Diagnosis not present

## 2023-01-29 DIAGNOSIS — E113392 Type 2 diabetes mellitus with moderate nonproliferative diabetic retinopathy without macular edema, left eye: Secondary | ICD-10-CM | POA: Diagnosis not present

## 2023-01-29 DIAGNOSIS — H4313 Vitreous hemorrhage, bilateral: Secondary | ICD-10-CM | POA: Diagnosis not present

## 2023-01-29 DIAGNOSIS — E113311 Type 2 diabetes mellitus with moderate nonproliferative diabetic retinopathy with macular edema, right eye: Secondary | ICD-10-CM | POA: Diagnosis not present

## 2023-02-04 DIAGNOSIS — H469 Unspecified optic neuritis: Secondary | ICD-10-CM | POA: Diagnosis not present

## 2023-02-04 DIAGNOSIS — H3500 Unspecified background retinopathy: Secondary | ICD-10-CM | POA: Diagnosis not present

## 2023-02-04 DIAGNOSIS — E113311 Type 2 diabetes mellitus with moderate nonproliferative diabetic retinopathy with macular edema, right eye: Secondary | ICD-10-CM | POA: Diagnosis not present

## 2023-02-04 DIAGNOSIS — E113312 Type 2 diabetes mellitus with moderate nonproliferative diabetic retinopathy with macular edema, left eye: Secondary | ICD-10-CM | POA: Diagnosis not present

## 2023-02-04 DIAGNOSIS — G7000 Myasthenia gravis without (acute) exacerbation: Secondary | ICD-10-CM | POA: Diagnosis not present

## 2023-02-04 DIAGNOSIS — H4313 Vitreous hemorrhage, bilateral: Secondary | ICD-10-CM | POA: Diagnosis not present

## 2023-02-04 DIAGNOSIS — E113392 Type 2 diabetes mellitus with moderate nonproliferative diabetic retinopathy without macular edema, left eye: Secondary | ICD-10-CM | POA: Diagnosis not present

## 2023-02-04 DIAGNOSIS — H0011 Chalazion right upper eyelid: Secondary | ICD-10-CM | POA: Diagnosis not present

## 2023-02-05 DIAGNOSIS — D8989 Other specified disorders involving the immune mechanism, not elsewhere classified: Secondary | ICD-10-CM | POA: Diagnosis not present

## 2023-02-05 DIAGNOSIS — H3500 Unspecified background retinopathy: Secondary | ICD-10-CM | POA: Diagnosis not present

## 2023-02-05 DIAGNOSIS — Z79899 Other long term (current) drug therapy: Secondary | ICD-10-CM | POA: Diagnosis not present

## 2023-02-09 DIAGNOSIS — G7000 Myasthenia gravis without (acute) exacerbation: Secondary | ICD-10-CM | POA: Diagnosis not present

## 2023-02-11 DIAGNOSIS — H3500 Unspecified background retinopathy: Secondary | ICD-10-CM | POA: Diagnosis not present

## 2023-02-11 DIAGNOSIS — H4313 Vitreous hemorrhage, bilateral: Secondary | ICD-10-CM | POA: Diagnosis not present

## 2023-02-11 DIAGNOSIS — H469 Unspecified optic neuritis: Secondary | ICD-10-CM | POA: Diagnosis not present

## 2023-02-11 DIAGNOSIS — E113312 Type 2 diabetes mellitus with moderate nonproliferative diabetic retinopathy with macular edema, left eye: Secondary | ICD-10-CM | POA: Diagnosis not present

## 2023-02-11 DIAGNOSIS — H0011 Chalazion right upper eyelid: Secondary | ICD-10-CM | POA: Diagnosis not present

## 2023-02-11 DIAGNOSIS — G7000 Myasthenia gravis without (acute) exacerbation: Secondary | ICD-10-CM | POA: Diagnosis not present

## 2023-02-16 DIAGNOSIS — H4311 Vitreous hemorrhage, right eye: Secondary | ICD-10-CM | POA: Diagnosis not present

## 2023-02-16 DIAGNOSIS — D649 Anemia, unspecified: Secondary | ICD-10-CM | POA: Diagnosis not present

## 2023-02-19 DIAGNOSIS — H3500 Unspecified background retinopathy: Secondary | ICD-10-CM | POA: Diagnosis not present

## 2023-02-19 DIAGNOSIS — D8989 Other specified disorders involving the immune mechanism, not elsewhere classified: Secondary | ICD-10-CM | POA: Diagnosis not present

## 2023-02-19 DIAGNOSIS — Z79899 Other long term (current) drug therapy: Secondary | ICD-10-CM | POA: Diagnosis not present

## 2023-02-22 ENCOUNTER — Telehealth: Payer: Self-pay | Admitting: Radiation Oncology

## 2023-02-22 DIAGNOSIS — C50412 Malignant neoplasm of upper-outer quadrant of left female breast: Secondary | ICD-10-CM | POA: Diagnosis not present

## 2023-02-22 DIAGNOSIS — Z79899 Other long term (current) drug therapy: Secondary | ICD-10-CM | POA: Diagnosis not present

## 2023-02-22 DIAGNOSIS — H4311 Vitreous hemorrhage, right eye: Secondary | ICD-10-CM | POA: Diagnosis not present

## 2023-02-22 DIAGNOSIS — E113591 Type 2 diabetes mellitus with proliferative diabetic retinopathy without macular edema, right eye: Secondary | ICD-10-CM | POA: Diagnosis not present

## 2023-02-22 DIAGNOSIS — Z170 Estrogen receptor positive status [ER+]: Secondary | ICD-10-CM | POA: Diagnosis not present

## 2023-02-22 NOTE — Telephone Encounter (Signed)
Patient called stating she would like to change her FUN visit w. Jill Side to a Tele health appointment. Returned patients call to changed appointment, no answer, LVM for a return call.

## 2023-02-23 ENCOUNTER — Ambulatory Visit: Admission: RE | Admit: 2023-02-23 | Payer: Medicare Other | Source: Ambulatory Visit

## 2023-02-23 ENCOUNTER — Ambulatory Visit
Admission: RE | Admit: 2023-02-23 | Discharge: 2023-02-23 | Disposition: A | Discharge status: Home / Self Care | Payer: Medicare Other | Source: Ambulatory Visit | Attending: Radiation Oncology | Admitting: Radiation Oncology

## 2023-02-23 ENCOUNTER — Encounter: Payer: Self-pay | Admitting: Radiation Oncology

## 2023-02-23 ENCOUNTER — Telehealth: Payer: Self-pay

## 2023-02-23 VITALS — Ht 63.0 in | Wt 185.0 lb

## 2023-02-23 DIAGNOSIS — G7000 Myasthenia gravis without (acute) exacerbation: Secondary | ICD-10-CM | POA: Diagnosis not present

## 2023-02-23 DIAGNOSIS — C50412 Malignant neoplasm of upper-outer quadrant of left female breast: Secondary | ICD-10-CM | POA: Insufficient documentation

## 2023-02-23 DIAGNOSIS — Z170 Estrogen receptor positive status [ER+]: Secondary | ICD-10-CM

## 2023-02-23 DIAGNOSIS — Z8673 Personal history of transient ischemic attack (TIA), and cerebral infarction without residual deficits: Secondary | ICD-10-CM | POA: Insufficient documentation

## 2023-02-23 DIAGNOSIS — H4312 Vitreous hemorrhage, left eye: Secondary | ICD-10-CM | POA: Diagnosis not present

## 2023-02-23 DIAGNOSIS — Z79624 Long term (current) use of inhibitors of nucleotide synthesis: Secondary | ICD-10-CM | POA: Diagnosis not present

## 2023-02-23 DIAGNOSIS — H3500 Unspecified background retinopathy: Secondary | ICD-10-CM | POA: Diagnosis not present

## 2023-02-23 DIAGNOSIS — Z7952 Long term (current) use of systemic steroids: Secondary | ICD-10-CM | POA: Insufficient documentation

## 2023-02-23 DIAGNOSIS — Z8669 Personal history of other diseases of the nervous system and sense organs: Secondary | ICD-10-CM | POA: Diagnosis not present

## 2023-02-23 DIAGNOSIS — H469 Unspecified optic neuritis: Secondary | ICD-10-CM | POA: Diagnosis not present

## 2023-02-23 DIAGNOSIS — Z79620 Long term (current) use of immunosuppressive biologic: Secondary | ICD-10-CM | POA: Insufficient documentation

## 2023-02-23 DIAGNOSIS — G4733 Obstructive sleep apnea (adult) (pediatric): Secondary | ICD-10-CM | POA: Diagnosis not present

## 2023-02-23 DIAGNOSIS — Z7982 Long term (current) use of aspirin: Secondary | ICD-10-CM | POA: Diagnosis not present

## 2023-02-23 DIAGNOSIS — E113312 Type 2 diabetes mellitus with moderate nonproliferative diabetic retinopathy with macular edema, left eye: Secondary | ICD-10-CM | POA: Diagnosis not present

## 2023-02-23 DIAGNOSIS — T451X5D Adverse effect of antineoplastic and immunosuppressive drugs, subsequent encounter: Secondary | ICD-10-CM | POA: Diagnosis not present

## 2023-02-23 DIAGNOSIS — G620 Drug-induced polyneuropathy: Secondary | ICD-10-CM | POA: Insufficient documentation

## 2023-02-23 DIAGNOSIS — Z9889 Other specified postprocedural states: Secondary | ICD-10-CM | POA: Diagnosis not present

## 2023-02-23 DIAGNOSIS — Z510 Encounter for antineoplastic radiation therapy: Secondary | ICD-10-CM | POA: Diagnosis not present

## 2023-02-23 NOTE — Progress Notes (Signed)
Radiation Oncology         [phone] ________________________________  Initial Outpatient Follow Up - Conducted via telephone at patient request.  I spoke with the patient to conduct this  visit via telephone. The patient was notified in advance and was offered an in person or telemedicine meeting to allow for face to face communication but instead preferred to proceed with a telephone visit.   Name: [name]        MRN: [id number]  Date of Service: 02/23/2023 DOB: 08-Apr-1951  WU:[id number], Annabelle Harman, DO  Malachy Mood, MD     REFERRING PHYSICIAN: Malachy Mood, MD   DIAGNOSIS: The encounter diagnosis was Malignant neoplasm of upper-outer quadrant of left breast in female, estrogen receptor positive (HCC).   HISTORY OF PRESENT ILLNESS: [name] is a 72 y.o. female originally seen in the multidisciplinary breast clinic for a new diagnosis of left breast cancer. The patient was noted to have screening detected group of masses in the left breast.  By diagnostic ultrasound in the 2 o'clock position of the left breast was a 7 [name] round mass corresponding to the mammogram findings with internal calcifications.  In the 3 o'clock position there was a 7 [name] irregular mass with irregular margins also correlating to the findings by imaging.  Her left axilla was negative for adenopathy.  Biopsies on 08/03/2022 were taken in the 2:00, 230 o'clock, and 3 o'clock position.  The 3:00 biopsy showed stromal fibrosis negative for malignancy.  At 2:30 the biopsy showed intraductal papilloma with apocrine metaplasia.  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  Her cancer was ER positive weak staining PR negative, HER2 negative with a Ki-67 of 5%.    She proceeded with lumpectomy and sentinel node biopsy on 09/02/2022.  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  Her invasive cancer measured up to 1 cm.   Her margins were negative for invasive disease and DCIS.  4 lymph nodes were examined and all were negative for metastatic disease.  Her tumor was retested for prognostics and was ER negative PR negative HER2 negative with a Ki-67 of 10%.  She met with Dr. Mosetta Putt and discussed systemic chemotherapy in the adjuvant setting and initially wasn't sure how she'd like to proceed. She also continues Rituxan and IV Ig for myasthenia with Dr. Bess Harvest at [facility].  She met back with Dr. Mosetta Putt and did go on to proceed with weekly Abraxane chemotherapy which she began on 10/21/22, and completed on 01/27/23. She's contacted by phone to review moving forward with radiotherapy. Of note she had a vitreous hemorrhage of her right eye that has not improved with injection as her left has responded, so she underwent a right vitrectomy yesterday at [facility].    PREVIOUS RADIATION THERAPY: No   PAST MEDICAL HISTORY:  Past Medical History:  Diagnosis Date   Arthritis    "all over my body" (03/13/2013)   Asthma    Asymptomatic carotid artery stenosis, bilateral 10/08/2018   Breast cancer (HCC)    GERD (gastroesophageal reflux disease)    H/O hiatal hernia    Headache    pt states she has had headaches for about 6 months "off and on"   Heart murmur    pt had echocardiogram on 09/17/21   Hypercholesterolemia 10/08/2018   Hypertension    Hypothyroidism    Myasthenia gravis (HCC)    "in  my eyes; diagnsosed > 7 yr ago" (03/13/2013)   Sleep apnea    on CPAP   Stroke (HCC) 2021   Thyroid carcinoma (HCC)    Type II diabetes mellitus (HCC)        PAST SURGICAL HISTORY: Past Surgical History:  Procedure Laterality Date   ABDOMINAL HYSTERECTOMY     ANTERIOR CERVICAL DECOMP/DISCECTOMY FUSION     "I've had severa ORs; always went in from the front" (03/13/2013)   APPENDECTOMY     BREAST LUMPECTOMY WITH RADIOACTIVE SEED AND SENTINEL LYMPH NODE BIOPSY Left 09/02/2022   Procedure: LEFT BREAST LUMPECTOMY WITH RADIOACTIVE SEED  AND SENTINEL LYMPH NODE BIOPSY;  Surgeon: Abigail Miyamoto, MD;  Location: [facility] OR;  Service: General;  Laterality: Left;   CARDIAC CATHETERIZATION     "several" (03/13/2013)   CARPAL TUNNEL RELEASE Right    CATARACT EXTRACTION W/ INTRAOCULAR LENS  IMPLANT, BILATERAL Bilateral    CHOLECYSTECTOMY     KNEE ARTHROSCOPY Left    SHOULDER ARTHROSCOPY W/ ROTATOR CUFF REPAIR Left    TONSILLECTOMY     TOTAL THYROIDECTOMY     TRANSCAROTID ARTERY REVASCULARIZATION  Left 09/24/2021   Procedure: LEFT TRANSCAROTID ARTERY REVASCULARIZATION;  Surgeon: Leonie Douglas, MD;  Location: [facility] OR;  Service: Vascular;  Laterality: Left;   TRANSCAROTID ARTERY REVASCULARIZATION  Right 10/23/2021   Procedure: Right Transcarotid Artery Revascularization;  Surgeon: Leonie Douglas, MD;  Location: [facility] OR;  Service: Vascular;  Laterality: Right;   ULTRASOUND GUIDANCE FOR VASCULAR ACCESS Right 09/24/2021   Procedure: ULTRASOUND GUIDANCE FOR VASCULAR ACCESS;  Surgeon: Leonie Douglas, MD;  Location: [facility] OR;  Service: Vascular;  Laterality: Right;   ULTRASOUND GUIDANCE FOR VASCULAR ACCESS Right 10/23/2021   Procedure: ULTRASOUND GUIDANCE FOR VASCULAR ACCESS, LEFT FEMORAL VEIN;  Surgeon: Leonie Douglas, MD;  Location: [facility] OR;  Service: Vascular;  Laterality: Right;     FAMILY HISTORY:  Family History  Problem Relation Age of Onset   Coronary artery disease Sister        s/p coronary stenting   Stroke Sister 23   Diabetes Sister    Congestive Heart Failure Mother    Asthma Mother    Diabetes Mother    Congestive Heart Failure Father    Lung cancer Father    Asthma Father    Diabetes Father    Diabetes Brother      SOCIAL HISTORY:  reports that she has never smoked. She has never used smokeless tobacco. She reports that she does not drink alcohol and does not use drugs. The patient is single and lives in [place].  She lives independently but does not drive given her neurologic history.    ALLERGIES: Contrast media  [iodinated contrast media], Fluorescein, Iodine, Molds & smuts, Shellfish-derived products, Pholcodine, Azithromycin, Codeine, Metformin hcl er, Oxycodone, Tramadol hcl, and Other   MEDICATIONS:  Current Outpatient Medications  Medication Sig Dispense Refill   acetaminophen (TYLENOL) 500 MG tablet Take 500 mg by mouth every 6 (six) hours as needed for moderate pain.     aspirin EC 81 MG EC tablet Take 1 tablet (81 mg total) by mouth daily.     Biotin 5000 MCG CAPS Take 5,000 mcg by mouth daily with breakfast.      cycloSPORINE (RESTASIS) 0.05 % ophthalmic emulsion Place 1 drop into both eyes 2 (two) times daily. 0.4 mL 0   diclofenac Sodium (VOLTAREN) 1 % GEL Apply 2 g topically 4 (four) times daily. (Patient taking differently: Apply 1 application  topically daily as needed (pain).) 2 g 0   diphenhydrAMINE 25 mg in sodium chloride 0.9 % 50 mL Inject 25 mg into the vein See admin instructions. Administer 25 mg intravenously at start of Gamunex infusion, then administer 25 mg during infusion     diphenoxylate-atropine (LOMOTIL) 2.5-0.025 MG tablet Take 1-2 tablets by mouth 4 (four) times daily as needed for diarrhea or loose stools. 30 tablet 2   ELDERBERRY PO Take 1 tablet by mouth daily.     empagliflozin (JARDIANCE) 25 MG TABS tablet Take 25 mg by mouth daily.     esomeprazole (NEXIUM) 40 MG capsule Take 1 capsule (40 mg total) by mouth 2 (two) times daily. 60 capsule 0   Evolocumab with Infusor (REPATHA PUSHTRONEX SYSTEM) 420 MG/3.5ML SOCT Inject 420 mg into the skin every 30 (thirty) days.     Exenatide ER (BYDUREON BCISE) 2 MG/0.85ML AUIJ Inject 2 mg into the skin every Monday.     ezetimibe (ZETIA) 10 MG tablet TAKE 1 TABLET BY MOUTH EVERY DAY 90 tablet 1   furosemide (LASIX) 40 MG tablet Take 40 mg by mouth daily as needed for edema.     gabapentin (NEURONTIN) 100 MG capsule Take 1 capsule (100 mg total) by mouth at bedtime. Increase to 2 cap at night in 2 weeks if needed 30 capsule 0    Immune Globulin, Human, 40 GM/400ML SOLN Inject into the vein every 6 (six) weeks. Infusions are administered at home by home health nurse - last infusion 09/08/21 and 09/09/21; next infusion due 10/19/21 and 10/20/21     insulin degludec (TRESIBA FLEXTOUCH) 100 UNIT/ML FlexTouch Pen Inject 15 Units into the skin in the morning.     Insulin Disposable Pump (OMNIPOD DASH 5 PACK PODS) MISC Use with Humalog     insulin lispro (HUMALOG) 100 UNIT/ML injection Inject into the skin See admin instructions. Inject subcutaneously 3 (three) times daily with meals With meals through the Omnipod Dash Pump every 72 hours as directed     levothyroxine (SYNTHROID) 175 MCG tablet Take 175 mcg by mouth daily before breakfast.     lidocaine-prilocaine (EMLA) cream Apply to affected area once 30 g 3   loperamide (IMODIUM A-D) 2 MG tablet Take 2 mg by mouth 4 (four) times daily as needed for diarrhea or loose stools.     loratadine (CLARITIN) 10 MG tablet Take 1 tablet (10 mg total) by mouth at bedtime. 30 tablet 0   meclizine (ANTIVERT) 25 MG tablet Take 25 mg by mouth 3 (three) times daily as needed for dizziness or nausea (or migraine-related nausea).     montelukast (SINGULAIR) 10 MG tablet Take 1 tablet (10 mg total) by mouth every morning. 30 tablet 0   mycophenolate (CELLCEPT) 500 MG tablet Take 1,000 mg by mouth 2 (two) times daily. For myasthenia gravis     olmesartan (BENICAR) 20 MG tablet Take 1 tablet (20 mg total) by mouth daily. 30 tablet 0   ondansetron (ZOFRAN) 8 MG tablet Take 1 tablet (8 mg total) by mouth every 8 (eight) hours as needed for nausea or vomiting. 30 tablet 1   predniSONE (DELTASONE) 2.5 MG tablet Take 7.5 mg by mouth every morning. For myasthenia gravis     PRESCRIPTION MEDICATION Pt uses a cpap nightly     PROAIR HFA 108 (90 BASE) MCG/ACT inhaler Inhale 2 puffs into the lungs every 6 (six) hours as needed for wheezing or shortness of breath.      prochlorperazine (COMPAZINE)  10 MG tablet  Take 1 tablet (10 mg total) by mouth every 6 (six) hours as needed for nausea or vomiting. 30 tablet 1   riTUXimab (RITUXAN) 500 MG/50ML injection Inject into the vein every 6 (six) months.     rosuvastatin (CRESTOR) 40 MG tablet Take 1 tablet (40 mg total) by mouth daily. 90 tablet 3   topiramate (TOPAMAX) 50 MG tablet Take 1 tablet (50 mg total) by mouth at bedtime. 90 tablet 3   traMADol (ULTRAM) 50 MG tablet Take 1 tablet (50 mg total) by mouth every 6 (six) hours as needed for moderate pain. 25 tablet 0   vitamin B-12 (CYANOCOBALAMIN) 1000 MCG tablet Take 1,000 mcg by mouth daily.     Vitamin D, Ergocalciferol, (DRISDOL) 50000 UNITS CAPS capsule Take 50,000 Units by mouth every Monday.      No current facility-administered medications for this encounter.     REVIEW OF SYSTEMS: On review of systems, the patient reports she is doing okay. She reports she is always tired and feels this is her baseline. Her vision is quite impaired from her myasthenia and she walks with a walker which is unchanged. No breast specific complaints are verbalized.      PHYSICAL EXAM:  Unable to assess due to encounter type.    ECOG = 1  0 - Asymptomatic (Fully active, able to carry on all predisease activities without restriction)  1 - Symptomatic but completely ambulatory (Restricted in physically strenuous activity but ambulatory and able to carry out work of a light or sedentary nature. For example, light housework, office work)  2 - Symptomatic, <50% in bed during the day (Ambulatory and capable of all self care but unable to carry out any work activities. Up and about more than 50% of waking hours)  3 - Symptomatic, >50% in bed, but not bedbound (Capable of only limited self-care, confined to bed or chair 50% or more of waking hours)  4 - Bedbound (Completely disabled. Cannot carry on any self-care. Totally confined to bed or chair)  5 - Death   [name], [name], [name], et al. [phone]).  "Toxicity and response criteria of the [facility] Group". Am. Evlyn Clines. Oncol. 5 (6): 649-55    LABORATORY DATA:  Lab Results  Component Value Date   WBC 3.6 (L) 01/27/2023   HGB 8.6 (L) 01/27/2023   HCT 26.6 (L) 01/27/2023   MCV 95.7 01/27/2023   PLT 280 01/27/2023   Lab Results  Component Value Date   NA 140 01/27/2023   K 3.6 01/27/2023   CL 110 01/27/2023   CO2 21 (L) 01/27/2023   Lab Results  Component Value Date   ALT 27 01/27/2023   AST 32 01/27/2023   ALKPHOS 151 (H) 01/27/2023   BILITOT 0.3 01/27/2023      RADIOGRAPHY: No results found.     IMPRESSION/PLAN: 1. Stage IB, pT1bN0M0, grade 2, triple negative invasive ductal carcinoma of the left breast. We reviewed our conversation a few months ago regarding the rationale for radiotherapy now that she has completed surgical healing, and systemic chemotherapy.  We discussed the risks, benefits, short, and long term effects of radiotherapy, as well as the curative intent, and the patient is interested in proceeding.  I reviewed the delivery and logistics of radiotherapy and again that Dr. Mitzi Hansen recommends 4 weeks of radiotherapy to the left breast with deep inspiration breath hold technique. She already has consent from her last visit in her chart.  She will come later this morning for simulation. 2. Insulin dependant diabetes. Due to concerns about function of her devices, we had previously reached out to her endocrinologist Dr. Talmage Nap at [facility] and the plan has been to do to do fingerstick glucose checks if needed and give injections of her insulin during the phase of treatment. 3. Myasthenia. The patient is followed by Dr. Selena Lesser at [facility] and takes cellcept, IV Ig, and Rituxan. These medications are not expected to interfere with her ability to receive radiation.  4. Right vitreous hemorrhage. The patient underwent a right posterior vitrectomy with Dr. Gwendalyn Ege yesterday and will continue to  follow up in his clinic.  This encounter was conducted via telephone.  The patient has provided two factor identification and has given verbal consent for this type of encounter and has been advised to only accept a meeting of this type in a secure network environment. The time spent during this encounter was 45 minutes including preparation, discussion, and coordination of the patient's care. The attendants for this meeting include   [name]  and [name] and [name]. During the encounter,  [name] waslocated at [facility] Radiation Oncology Department.  [name] was located on her way to the office with her sister [name].        [name], [facility]    **Disclaimer: This note was dictated with voice recognition software. Similar sounding words can inadvertently be transcribed and this note may contain transcription errors which may not have been corrected upon publication of note.**

## 2023-02-23 NOTE — Progress Notes (Signed)
Nursing interview for Malignant neoplasm of upper-outer quadrant of left breast in female, estrogen receptor positive (HCC). Stage IB (pT1b, pN0, cM0  Patient identity verified x2.  Patient reports doing well. No issues conveyed at this time.  Meaningful use complete.  Vitals- Ht 5\' 3"  (1.6 m)   Wt 185 lb (83.9 kg)   BMI 32.77 kg/m   This concludes the interaction.  Ruel Favors, LPN

## 2023-02-23 NOTE — Telephone Encounter (Signed)
Pt called to check on the status of her phone call with Lysbeth Galas. Jill Side stated she had left a voicemail for pt at 9:30 and had not heard from the pt.[name]

## 2023-03-02 ENCOUNTER — Encounter: Payer: Self-pay | Admitting: *Deleted

## 2023-03-02 DIAGNOSIS — C50412 Malignant neoplasm of upper-outer quadrant of left female breast: Secondary | ICD-10-CM

## 2023-03-04 DIAGNOSIS — H469 Unspecified optic neuritis: Secondary | ICD-10-CM | POA: Diagnosis not present

## 2023-03-04 DIAGNOSIS — E113312 Type 2 diabetes mellitus with moderate nonproliferative diabetic retinopathy with macular edema, left eye: Secondary | ICD-10-CM | POA: Diagnosis not present

## 2023-03-04 DIAGNOSIS — H3500 Unspecified background retinopathy: Secondary | ICD-10-CM | POA: Diagnosis not present

## 2023-03-04 DIAGNOSIS — Z8669 Personal history of other diseases of the nervous system and sense organs: Secondary | ICD-10-CM | POA: Diagnosis not present

## 2023-03-04 DIAGNOSIS — H4312 Vitreous hemorrhage, left eye: Secondary | ICD-10-CM | POA: Diagnosis not present

## 2023-03-04 DIAGNOSIS — G7000 Myasthenia gravis without (acute) exacerbation: Secondary | ICD-10-CM | POA: Diagnosis not present

## 2023-03-04 DIAGNOSIS — Z9889 Other specified postprocedural states: Secondary | ICD-10-CM | POA: Diagnosis not present

## 2023-03-05 DIAGNOSIS — Z170 Estrogen receptor positive status [ER+]: Secondary | ICD-10-CM | POA: Diagnosis not present

## 2023-03-05 DIAGNOSIS — Z79624 Long term (current) use of inhibitors of nucleotide synthesis: Secondary | ICD-10-CM | POA: Diagnosis not present

## 2023-03-05 DIAGNOSIS — G620 Drug-induced polyneuropathy: Secondary | ICD-10-CM | POA: Diagnosis not present

## 2023-03-05 DIAGNOSIS — C50412 Malignant neoplasm of upper-outer quadrant of left female breast: Secondary | ICD-10-CM | POA: Diagnosis not present

## 2023-03-05 DIAGNOSIS — Z7982 Long term (current) use of aspirin: Secondary | ICD-10-CM | POA: Diagnosis not present

## 2023-03-05 DIAGNOSIS — Z7952 Long term (current) use of systemic steroids: Secondary | ICD-10-CM | POA: Diagnosis not present

## 2023-03-05 DIAGNOSIS — Z8673 Personal history of transient ischemic attack (TIA), and cerebral infarction without residual deficits: Secondary | ICD-10-CM | POA: Diagnosis not present

## 2023-03-05 DIAGNOSIS — Z79620 Long term (current) use of immunosuppressive biologic: Secondary | ICD-10-CM | POA: Diagnosis not present

## 2023-03-05 DIAGNOSIS — G7000 Myasthenia gravis without (acute) exacerbation: Secondary | ICD-10-CM | POA: Diagnosis not present

## 2023-03-05 DIAGNOSIS — Z510 Encounter for antineoplastic radiation therapy: Secondary | ICD-10-CM | POA: Diagnosis not present

## 2023-03-05 DIAGNOSIS — T451X5D Adverse effect of antineoplastic and immunosuppressive drugs, subsequent encounter: Secondary | ICD-10-CM | POA: Diagnosis not present

## 2023-03-08 ENCOUNTER — Ambulatory Visit: Admission: RE | Admit: 2023-03-08 | Payer: Medicare Other | Source: Ambulatory Visit | Admitting: Radiation Oncology

## 2023-03-08 ENCOUNTER — Other Ambulatory Visit: Payer: Self-pay

## 2023-03-08 DIAGNOSIS — Z7982 Long term (current) use of aspirin: Secondary | ICD-10-CM | POA: Diagnosis not present

## 2023-03-08 DIAGNOSIS — T451X5D Adverse effect of antineoplastic and immunosuppressive drugs, subsequent encounter: Secondary | ICD-10-CM | POA: Diagnosis not present

## 2023-03-08 DIAGNOSIS — Z510 Encounter for antineoplastic radiation therapy: Secondary | ICD-10-CM | POA: Diagnosis not present

## 2023-03-08 DIAGNOSIS — C50412 Malignant neoplasm of upper-outer quadrant of left female breast: Secondary | ICD-10-CM | POA: Diagnosis not present

## 2023-03-08 DIAGNOSIS — Z170 Estrogen receptor positive status [ER+]: Secondary | ICD-10-CM | POA: Diagnosis not present

## 2023-03-08 DIAGNOSIS — Z7952 Long term (current) use of systemic steroids: Secondary | ICD-10-CM | POA: Diagnosis not present

## 2023-03-08 DIAGNOSIS — G7000 Myasthenia gravis without (acute) exacerbation: Secondary | ICD-10-CM | POA: Diagnosis not present

## 2023-03-08 DIAGNOSIS — G620 Drug-induced polyneuropathy: Secondary | ICD-10-CM | POA: Diagnosis not present

## 2023-03-08 DIAGNOSIS — Z8673 Personal history of transient ischemic attack (TIA), and cerebral infarction without residual deficits: Secondary | ICD-10-CM | POA: Diagnosis not present

## 2023-03-08 DIAGNOSIS — Z79624 Long term (current) use of inhibitors of nucleotide synthesis: Secondary | ICD-10-CM | POA: Diagnosis not present

## 2023-03-08 DIAGNOSIS — Z79620 Long term (current) use of immunosuppressive biologic: Secondary | ICD-10-CM | POA: Diagnosis not present

## 2023-03-08 LAB — RAD ONC ARIA SESSION SUMMARY
Course Elapsed Days: 0
Plan Fractions Treated to Date: 1
Plan Prescribed Dose Per Fraction: 2.66 Gy
Plan Total Fractions Prescribed: 16
Plan Total Prescribed Dose: 42.56 Gy
Reference Point Dosage Given to Date: 2.66 Gy
Reference Point Session Dosage Given: 2.66 Gy
Session Number: 1

## 2023-03-09 ENCOUNTER — Ambulatory Visit
Admission: RE | Admit: 2023-03-09 | Discharge: 2023-03-09 | Disposition: A | Discharge status: Home / Self Care | Payer: Medicare Other | Source: Ambulatory Visit | Attending: Radiation Oncology | Admitting: Radiation Oncology

## 2023-03-09 ENCOUNTER — Other Ambulatory Visit: Payer: Self-pay

## 2023-03-09 DIAGNOSIS — T451X5D Adverse effect of antineoplastic and immunosuppressive drugs, subsequent encounter: Secondary | ICD-10-CM | POA: Diagnosis not present

## 2023-03-09 DIAGNOSIS — Z8673 Personal history of transient ischemic attack (TIA), and cerebral infarction without residual deficits: Secondary | ICD-10-CM | POA: Diagnosis not present

## 2023-03-09 DIAGNOSIS — Z79624 Long term (current) use of inhibitors of nucleotide synthesis: Secondary | ICD-10-CM | POA: Diagnosis not present

## 2023-03-09 DIAGNOSIS — C50412 Malignant neoplasm of upper-outer quadrant of left female breast: Secondary | ICD-10-CM | POA: Diagnosis not present

## 2023-03-09 DIAGNOSIS — Z510 Encounter for antineoplastic radiation therapy: Secondary | ICD-10-CM | POA: Diagnosis not present

## 2023-03-09 DIAGNOSIS — G620 Drug-induced polyneuropathy: Secondary | ICD-10-CM | POA: Diagnosis not present

## 2023-03-09 DIAGNOSIS — Z7982 Long term (current) use of aspirin: Secondary | ICD-10-CM | POA: Diagnosis not present

## 2023-03-09 DIAGNOSIS — Z7952 Long term (current) use of systemic steroids: Secondary | ICD-10-CM | POA: Diagnosis not present

## 2023-03-09 DIAGNOSIS — Z79620 Long term (current) use of immunosuppressive biologic: Secondary | ICD-10-CM | POA: Diagnosis not present

## 2023-03-09 DIAGNOSIS — Z170 Estrogen receptor positive status [ER+]: Secondary | ICD-10-CM | POA: Diagnosis not present

## 2023-03-09 DIAGNOSIS — G7000 Myasthenia gravis without (acute) exacerbation: Secondary | ICD-10-CM | POA: Diagnosis not present

## 2023-03-09 LAB — RAD ONC ARIA SESSION SUMMARY
Course Elapsed Days: 1
Plan Fractions Treated to Date: 2
Plan Prescribed Dose Per Fraction: 2.66 Gy
Plan Total Fractions Prescribed: 16
Plan Total Prescribed Dose: 42.56 Gy
Reference Point Dosage Given to Date: 5.32 Gy
Reference Point Session Dosage Given: 2.66 Gy
Session Number: 2

## 2023-03-10 ENCOUNTER — Other Ambulatory Visit: Payer: Self-pay

## 2023-03-10 ENCOUNTER — Ambulatory Visit: Payer: Medicare Other

## 2023-03-10 DIAGNOSIS — Z79620 Long term (current) use of immunosuppressive biologic: Secondary | ICD-10-CM | POA: Diagnosis not present

## 2023-03-10 DIAGNOSIS — Z170 Estrogen receptor positive status [ER+]: Secondary | ICD-10-CM

## 2023-03-10 DIAGNOSIS — Z79624 Long term (current) use of inhibitors of nucleotide synthesis: Secondary | ICD-10-CM | POA: Diagnosis not present

## 2023-03-10 DIAGNOSIS — T451X5D Adverse effect of antineoplastic and immunosuppressive drugs, subsequent encounter: Secondary | ICD-10-CM | POA: Diagnosis not present

## 2023-03-10 DIAGNOSIS — G7000 Myasthenia gravis without (acute) exacerbation: Secondary | ICD-10-CM | POA: Diagnosis not present

## 2023-03-10 DIAGNOSIS — Z7952 Long term (current) use of systemic steroids: Secondary | ICD-10-CM | POA: Diagnosis not present

## 2023-03-10 DIAGNOSIS — Z8673 Personal history of transient ischemic attack (TIA), and cerebral infarction without residual deficits: Secondary | ICD-10-CM | POA: Diagnosis not present

## 2023-03-10 DIAGNOSIS — C50412 Malignant neoplasm of upper-outer quadrant of left female breast: Secondary | ICD-10-CM | POA: Diagnosis not present

## 2023-03-10 DIAGNOSIS — Z510 Encounter for antineoplastic radiation therapy: Secondary | ICD-10-CM | POA: Diagnosis not present

## 2023-03-10 DIAGNOSIS — G620 Drug-induced polyneuropathy: Secondary | ICD-10-CM | POA: Diagnosis not present

## 2023-03-10 DIAGNOSIS — Z7982 Long term (current) use of aspirin: Secondary | ICD-10-CM | POA: Diagnosis not present

## 2023-03-10 LAB — RAD ONC ARIA SESSION SUMMARY
Course Elapsed Days: 2
Plan Fractions Treated to Date: 3
Plan Prescribed Dose Per Fraction: 2.66 Gy
Plan Total Fractions Prescribed: 16
Plan Total Prescribed Dose: 42.56 Gy
Reference Point Dosage Given to Date: 7.98 Gy
Reference Point Session Dosage Given: 2.66 Gy
Session Number: 3

## 2023-03-11 ENCOUNTER — Inpatient Hospital Stay (HOSPITAL_BASED_OUTPATIENT_CLINIC_OR_DEPARTMENT_OTHER): Payer: Medicare Other | Admitting: Hematology

## 2023-03-11 ENCOUNTER — Ambulatory Visit
Admission: RE | Admit: 2023-03-11 | Discharge: 2023-03-11 | Disposition: A | Discharge status: Home / Self Care | Payer: Medicare Other | Source: Ambulatory Visit | Attending: Radiation Oncology | Admitting: Radiation Oncology

## 2023-03-11 ENCOUNTER — Other Ambulatory Visit: Payer: Self-pay

## 2023-03-11 ENCOUNTER — Encounter: Payer: Self-pay | Admitting: Hematology

## 2023-03-11 ENCOUNTER — Inpatient Hospital Stay: Payer: Medicare Other

## 2023-03-11 VITALS — BP 152/60 | HR 95 | Temp 97.9°F | Resp 17 | Ht 63.0 in | Wt 190.7 lb

## 2023-03-11 DIAGNOSIS — C50412 Malignant neoplasm of upper-outer quadrant of left female breast: Secondary | ICD-10-CM | POA: Insufficient documentation

## 2023-03-11 DIAGNOSIS — Z7982 Long term (current) use of aspirin: Secondary | ICD-10-CM | POA: Insufficient documentation

## 2023-03-11 DIAGNOSIS — E2839 Other primary ovarian failure: Secondary | ICD-10-CM | POA: Diagnosis not present

## 2023-03-11 DIAGNOSIS — Z79624 Long term (current) use of inhibitors of nucleotide synthesis: Secondary | ICD-10-CM | POA: Insufficient documentation

## 2023-03-11 DIAGNOSIS — Z7952 Long term (current) use of systemic steroids: Secondary | ICD-10-CM | POA: Insufficient documentation

## 2023-03-11 DIAGNOSIS — T451X5D Adverse effect of antineoplastic and immunosuppressive drugs, subsequent encounter: Secondary | ICD-10-CM | POA: Insufficient documentation

## 2023-03-11 DIAGNOSIS — G7000 Myasthenia gravis without (acute) exacerbation: Secondary | ICD-10-CM | POA: Insufficient documentation

## 2023-03-11 DIAGNOSIS — Z8673 Personal history of transient ischemic attack (TIA), and cerebral infarction without residual deficits: Secondary | ICD-10-CM | POA: Insufficient documentation

## 2023-03-11 DIAGNOSIS — Z510 Encounter for antineoplastic radiation therapy: Secondary | ICD-10-CM | POA: Diagnosis not present

## 2023-03-11 DIAGNOSIS — G620 Drug-induced polyneuropathy: Secondary | ICD-10-CM | POA: Insufficient documentation

## 2023-03-11 DIAGNOSIS — Z79620 Long term (current) use of immunosuppressive biologic: Secondary | ICD-10-CM | POA: Insufficient documentation

## 2023-03-11 DIAGNOSIS — Z170 Estrogen receptor positive status [ER+]: Secondary | ICD-10-CM | POA: Insufficient documentation

## 2023-03-11 DIAGNOSIS — Z95828 Presence of other vascular implants and grafts: Secondary | ICD-10-CM

## 2023-03-11 LAB — CMP (CANCER CENTER ONLY)
ALT: 27 U/L (ref 0–44)
AST: 25 U/L (ref 15–41)
Albumin: 3.8 g/dL (ref 3.5–5.0)
Alkaline Phosphatase: 165 U/L — ABNORMAL HIGH (ref 38–126)
Anion gap: 8 (ref 5–15)
BUN: 29 mg/dL — ABNORMAL HIGH (ref 8–23)
CO2: 22 mmol/L (ref 22–32)
Calcium: 9.2 mg/dL (ref 8.9–10.3)
Chloride: 111 mmol/L (ref 98–111)
Creatinine: 1.08 mg/dL — ABNORMAL HIGH (ref 0.44–1.00)
GFR, Estimated: 56 mL/min — ABNORMAL LOW (ref >60)
Glucose, Bld: 170 mg/dL — ABNORMAL HIGH (ref 70–99)
Potassium: 4.3 mmol/L (ref 3.5–5.1)
Sodium: 141 mmol/L (ref 135–145)
Total Bilirubin: 0.4 mg/dL (ref 0.3–1.2)
Total Protein: 6.9 g/dL (ref 6.5–8.1)

## 2023-03-11 LAB — RAD ONC ARIA SESSION SUMMARY
Course Elapsed Days: 3
Plan Fractions Treated to Date: 4
Plan Prescribed Dose Per Fraction: 2.66 Gy
Plan Total Fractions Prescribed: 16
Plan Total Prescribed Dose: 42.56 Gy
Reference Point Dosage Given to Date: 10.64 Gy
Reference Point Session Dosage Given: 2.66 Gy
Session Number: 4

## 2023-03-11 LAB — CBC WITH DIFFERENTIAL (CANCER CENTER ONLY)
Abs Immature Granulocytes: 0.03 10*3/uL (ref 0.00–0.07)
Basophils Absolute: 0.1 10*3/uL (ref 0.0–0.1)
Basophils Relative: 1 %
Eosinophils Absolute: 0.1 10*3/uL (ref 0.0–0.5)
Eosinophils Relative: 1 %
HCT: 28.5 % — ABNORMAL LOW (ref 36.0–46.0)
Hemoglobin: 9.3 g/dL — ABNORMAL LOW (ref 12.0–15.0)
Immature Granulocytes: 0 %
Lymphocytes Relative: 8 %
Lymphs Abs: 0.7 10*3/uL (ref 0.7–4.0)
MCH: 31.4 pg (ref 26.0–34.0)
MCHC: 32.6 g/dL (ref 30.0–36.0)
MCV: 96.3 fL (ref 80.0–100.0)
Monocytes Absolute: 0.6 10*3/uL (ref 0.1–1.0)
Monocytes Relative: 6 %
Neutro Abs: 7.2 10*3/uL (ref 1.7–7.7)
Neutrophils Relative %: 84 %
Platelet Count: 268 10*3/uL (ref 150–400)
RBC: 2.96 MIL/uL — ABNORMAL LOW (ref 3.87–5.11)
RDW: 13 % (ref 11.5–15.5)
WBC Count: 8.6 10*3/uL (ref 4.0–10.5)
nRBC: 0 % (ref 0.0–0.2)

## 2023-03-11 MED ORDER — HEPARIN SOD (PORK) LOCK FLUSH 100 UNIT/ML IV SOLN
500.0000 [IU] | Freq: Once | INTRAVENOUS | Status: AC
  Administered 2023-03-11: 500 [IU]

## 2023-03-11 MED ORDER — SODIUM CHLORIDE 0.9% FLUSH
10.0000 mL | Freq: Once | INTRAVENOUS | Status: AC
  Administered 2023-03-11: 10 mL

## 2023-03-11 NOTE — Progress Notes (Signed)
[facility]   [phone] [phone]   Clinic Follow up Note   Patient Care Team: Irena Reichmann, DO as PCP - General (Family Medicine) Micki Riley, MD as Consulting Physician (Neurology) Ihor Austin, NP as Nurse Practitioner (Neurology) Donnelly Angelica, RN as Oncology Nurse Navigator Pershing Proud, RN as Oncology Nurse Navigator Abigail Miyamoto, MD as Consulting Physician (General Surgery) Malachy Mood, MD as Consulting Physician (Hematology) Dorothy Puffer, MD as Consulting Physician (Radiation Oncology) Dianne Dun, MD as Consulting Physician (Rheumatology)  Date of Service:  03/11/2023  CHIEF COMPLAINT: f/u of  left breast cancer     CURRENT THERAPY:  Adjuvant radiation     ASSESSMENT:  [name] is a 72 y.o. 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 -she completed adjuvant chemo with weekly Abraxane for 12 weeks, with some dose reduction.  Overall tolerated well -She is on adjuvant radiation, tolerating well, with some fatigue -I discussed the role of adjuvant antiestrogen therapy, given her ER positive disease.  I discussed option of aromatase inhibitor and tamoxifen.  Will obtain a baseline bone density scan in the next month.  Due to her significant fatigue from chemotherapy and radiation, plan to start antiacid therapy in 2 months.   Myasthenia gravis (HCC) -She is on Rituxan every 6 months, CellCept and prednisone.  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, her DM may contribute also -neuropathy is mild and stable     PLAN: -lab reviewed hg -9.3 -CMP-pending --I order bone density scan  -discuss Antiestrogen therapy -lab/flush and f/u in 2 month, will start adjuvant dialysis therapy after next visit    SUMMARY OF ONCOLOGIC HISTORY: Oncology History Overview Note   Cancer Staging  Malignant neoplasm of upper-outer quadrant of left breast in female, estrogen receptor positive (HCC) Staging form: Breast, AJCC 8th Edition - Clinical: Stage IB (cT1b, cN0, cM0, G3, ER+, PR-, HER2-) - Signed by Ronny Bacon, PA-C on 08/10/2022 Stage prefix: Initial diagnosis Method of lymph node assessment: Clinical Histologic grading system: 3 grade system - Pathologic stage from 09/02/2022: Stage IB (pT1b, pN0, cM0, G2, ER-, PR-, HER2-) - Signed by Malachy Mood, MD on 09/23/2022 Stage prefix: Initial diagnosis Histologic grading system: 3 grade system Residual tumor (R): R0 - None     Malignant neoplasm of upper-outer quadrant of left breast in female, estrogen receptor positive (HCC)  08/10/2022 Initial Diagnosis   Malignant neoplasm of upper-outer quadrant of left female breast (HCC)   08/10/2022 Cancer Staging   Staging form: Breast, AJCC 8th Edition - Clinical: Stage IB (cT1b, cN0, cM0, G3, ER+, PR-, HER2-) - Signed by Ronny Bacon, PA-C on 08/10/2022 Stage prefix: Initial diagnosis Method of lymph node assessment: Clinical Histologic grading system: 3 grade system   09/02/2022 Cancer Staging   Staging form: Breast, AJCC 8th Edition - Pathologic stage from 09/02/2022: Stage IB (pT1b, pN0, cM0, G2, ER-, PR-, HER2-) - Signed by Malachy Mood, MD on 09/23/2022 Stage prefix: Initial diagnosis Histologic grading system: 3 grade system Residual tumor (R): R0 - None   10/21/2022 -  Chemotherapy   Patient is on  Treatment Plan : BREAST PACLitaxel-Albumin (Abraxane) (100) D1,8,15 q28d        INTERVAL HISTORY:  [name] is here for a follow up of  left breast cancer . She was last seen by me  on 01/27/2023. She presents to the clinic accompanied by sister. Pt report of starting Radiation Monday. Pt state that she has numbness and tingling in her right fingers.     All other systems were reviewed with the patient and are negative.  MEDICAL HISTORY:  Past Medical History:  Diagnosis Date   Arthritis    "all over my body" (03/13/2013)   Asthma    Asymptomatic carotid artery stenosis, bilateral 10/08/2018   Breast cancer (HCC)    GERD (gastroesophageal reflux disease)    H/O hiatal hernia    Headache    pt states she has had headaches for about 6 months "off and on"   Heart murmur    pt had echocardiogram on 09/17/21   Hypercholesterolemia 10/08/2018   Hypertension    Hypothyroidism    Myasthenia gravis (HCC)    "in my eyes; diagnsosed > 7 yr ago" (03/13/2013)   Sleep apnea    on CPAP   Stroke (HCC) 2021   Thyroid carcinoma (HCC)    Type II diabetes mellitus (HCC)     SURGICAL HISTORY: Past Surgical History:  Procedure Laterality Date   ABDOMINAL HYSTERECTOMY     ANTERIOR CERVICAL DECOMP/DISCECTOMY FUSION     "I've had severa ORs; always went in from the front" (03/13/2013)   APPENDECTOMY     BREAST LUMPECTOMY WITH RADIOACTIVE SEED AND SENTINEL LYMPH NODE BIOPSY Left 09/02/2022   Procedure: LEFT BREAST LUMPECTOMY WITH RADIOACTIVE SEED AND SENTINEL LYMPH NODE BIOPSY;  Surgeon: Abigail Miyamoto, MD;  Location: [facility] OR;  Service: General;  Laterality: Left;   CARDIAC CATHETERIZATION     "several" (03/13/2013)   CARPAL TUNNEL RELEASE Right    CATARACT EXTRACTION W/ INTRAOCULAR LENS  IMPLANT, BILATERAL Bilateral    CHOLECYSTECTOMY     KNEE ARTHROSCOPY Left    SHOULDER ARTHROSCOPY W/ ROTATOR CUFF REPAIR Left    TONSILLECTOMY     TOTAL THYROIDECTOMY     TRANSCAROTID ARTERY REVASCULARIZATION  Left 09/24/2021   Procedure: LEFT TRANSCAROTID ARTERY REVASCULARIZATION;  Surgeon: Leonie Douglas, MD;  Location: [facility] OR;  Service: Vascular;  Laterality: Left;   TRANSCAROTID ARTERY  REVASCULARIZATION  Right 10/23/2021   Procedure: Right Transcarotid Artery Revascularization;  Surgeon: Leonie Douglas, MD;  Location: [facility] OR;  Service: Vascular;  Laterality: Right;   ULTRASOUND GUIDANCE FOR VASCULAR ACCESS Right 09/24/2021   Procedure: ULTRASOUND GUIDANCE FOR VASCULAR ACCESS;  Surgeon: Leonie Douglas, MD;  Location: [facility] OR;  Service: Vascular;  Laterality: Right;   ULTRASOUND GUIDANCE FOR VASCULAR ACCESS Right 10/23/2021   Procedure: ULTRASOUND GUIDANCE FOR VASCULAR ACCESS, LEFT FEMORAL VEIN;  Surgeon: Leonie Douglas, MD;  Location: [facility] OR;  Service: Vascular;  Laterality: Right;    I have reviewed the social history and family history with the patient and they are unchanged from previous note.  ALLERGIES:  is allergic to contrast media [iodinated contrast media], fluorescein, iodine, molds & smuts, shellfish-derived products, pholcodine, azithromycin, codeine, metformin hcl er, oxycodone, tramadol hcl, and other.  MEDICATIONS:  Current Outpatient Medications  Medication Sig Dispense Refill   acetaminophen (TYLENOL) 500 MG tablet Take 500 mg by mouth every 6 (six) hours as needed for moderate pain.     aspirin EC 81 MG EC tablet Take 1 tablet (  81 mg total) by mouth daily.     Biotin 5000 MCG CAPS Take 5,000 mcg by mouth daily with breakfast.      cycloSPORINE (RESTASIS) 0.05 % ophthalmic emulsion Place 1 drop into both eyes 2 (two) times daily. 0.4 mL 0   diclofenac Sodium (VOLTAREN) 1 % GEL Apply 2 g topically 4 (four) times daily. (Patient taking differently: Apply 1 application  topically daily as needed (pain).) 2 g 0   diphenhydrAMINE 25 mg in sodium chloride 0.9 % 50 mL Inject 25 mg into the vein See admin instructions. Administer 25 mg intravenously at start of Gamunex infusion, then administer 25 mg during infusion     diphenoxylate-atropine (LOMOTIL) 2.5-0.025 MG tablet Take 1-2 tablets by mouth 4 (four) times daily as needed for diarrhea or loose stools. 30 tablet 2    ELDERBERRY PO Take 1 tablet by mouth daily.     empagliflozin (JARDIANCE) 25 MG TABS tablet Take 25 mg by mouth daily.     esomeprazole (NEXIUM) 40 MG capsule Take 1 capsule (40 mg total) by mouth 2 (two) times daily. 60 capsule 0   Evolocumab with Infusor (REPATHA PUSHTRONEX SYSTEM) 420 MG/3.5ML SOCT Inject 420 mg into the skin every 30 (thirty) days.     Exenatide ER (BYDUREON BCISE) 2 MG/0.85ML AUIJ Inject 2 mg into the skin every Monday.     ezetimibe (ZETIA) 10 MG tablet TAKE 1 TABLET BY MOUTH EVERY DAY 90 tablet 1   furosemide (LASIX) 40 MG tablet Take 40 mg by mouth daily as needed for edema.     gabapentin (NEURONTIN) 100 MG capsule Take 1 capsule (100 mg total) by mouth at bedtime. Increase to 2 cap at night in 2 weeks if needed 30 capsule 0   Immune Globulin, Human, 40 GM/400ML SOLN Inject into the vein every 6 (six) weeks. Infusions are administered at home by home health nurse - last infusion 09/08/21 and 09/09/21; next infusion due 10/19/21 and 10/20/21     insulin degludec (TRESIBA FLEXTOUCH) 100 UNIT/ML FlexTouch Pen Inject 15 Units into the skin in the morning.     Insulin Disposable Pump (OMNIPOD DASH 5 PACK PODS) MISC Use with Humalog     insulin lispro (HUMALOG) 100 UNIT/ML injection Inject into the skin See admin instructions. Inject subcutaneously 3 (three) times daily with meals With meals through the Omnipod Dash Pump every 72 hours as directed     levothyroxine (SYNTHROID) 175 MCG tablet Take 175 mcg by mouth daily before breakfast.     lidocaine-prilocaine (EMLA) cream Apply to affected area once 30 g 3   loperamide (IMODIUM A-D) 2 MG tablet Take 2 mg by mouth 4 (four) times daily as needed for diarrhea or loose stools.     loratadine (CLARITIN) 10 MG tablet Take 1 tablet (10 mg total) by mouth at bedtime. 30 tablet 0   meclizine (ANTIVERT) 25 MG tablet Take 25 mg by mouth 3 (three) times daily as needed for dizziness or nausea (or migraine-related nausea).     montelukast  (SINGULAIR) 10 MG tablet Take 1 tablet (10 mg total) by mouth every morning. 30 tablet 0   mycophenolate (CELLCEPT) 500 MG tablet Take 1,000 mg by mouth 2 (two) times daily. For myasthenia gravis     olmesartan (BENICAR) 20 MG tablet Take 1 tablet (20 mg total) by mouth daily. 30 tablet 0   ondansetron (ZOFRAN) 8 MG tablet Take 1 tablet (8 mg total) by mouth every 8 (eight) hours as needed for  nausea or vomiting. 30 tablet 1   predniSONE (DELTASONE) 2.5 MG tablet Take 7.5 mg by mouth every morning. For myasthenia gravis     PRESCRIPTION MEDICATION Pt uses a cpap nightly     PROAIR HFA 108 (90 BASE) MCG/ACT inhaler Inhale 2 puffs into the lungs every 6 (six) hours as needed for wheezing or shortness of breath.      prochlorperazine (COMPAZINE) 10 MG tablet Take 1 tablet (10 mg total) by mouth every 6 (six) hours as needed for nausea or vomiting. 30 tablet 1   riTUXimab (RITUXAN) 500 MG/50ML injection Inject into the vein every 6 (six) months.     rosuvastatin (CRESTOR) 40 MG tablet Take 1 tablet (40 mg total) by mouth daily. 90 tablet 3   topiramate (TOPAMAX) 50 MG tablet Take 1 tablet (50 mg total) by mouth at bedtime. 90 tablet 3   traMADol (ULTRAM) 50 MG tablet Take 1 tablet (50 mg total) by mouth every 6 (six) hours as needed for moderate pain. 25 tablet 0   vitamin B-12 (CYANOCOBALAMIN) 1000 MCG tablet Take 1,000 mcg by mouth daily.     Vitamin D, Ergocalciferol, (DRISDOL) 50000 UNITS CAPS capsule Take 50,000 Units by mouth every Monday.      No current facility-administered medications for this visit.    PHYSICAL EXAMINATION: ECOG PERFORMANCE STATUS: 2 - Symptomatic, <50% confined to bed  Vitals:   03/11/23 1349  BP: (!) 152/60  Pulse: 95  Resp: 17  Temp: 97.9 F (36.6 C)  SpO2: 97%   Wt Readings from Last 3 Encounters:  03/11/23 190 lb 11.2 oz (86.5 kg)  02/23/23 185 lb (83.9 kg)  01/27/23 192 lb 4.8 oz (87.2 kg)     GENERAL:alert, no distress and comfortable SKIN: skin  color normal, no rashes or significant lesions EYES: normal, Conjunctiva are pink and non-injected, sclera clear  NEURO: alert & oriented x 3 with fluent speech  LABORATORY DATA:  I have reviewed the data as listed    Latest Ref Rng & Units 03/11/2023   12:55 PM 01/27/2023    8:01 AM 01/20/2023    8:02 AM  CBC  WBC 4.0 - 10.5 K/uL 8.6  3.6  6.4   Hemoglobin 12.0 - 15.0 g/dL 9.3  8.6  8.9   Hematocrit 36.0 - 46.0 % 28.5  26.6  26.5   Platelets 150 - 400 K/uL 268  280  267         Latest Ref Rng & Units 03/11/2023   12:55 PM 01/27/2023    8:01 AM 01/20/2023    8:02 AM  CMP  Glucose 70 - 99 mg/dL 952  841  324   BUN 8 - 23 mg/dL 29  22  20    Creatinine 0.44 - 1.00 mg/dL 4.01  0.27  2.53   Sodium 135 - 145 mmol/L 141  140  141   Potassium 3.5 - 5.1 mmol/L 4.3  3.6  3.6   Chloride 98 - 111 mmol/L 111  110  110   CO2 22 - 32 mmol/L 22  21  23    Calcium 8.9 - 10.3 mg/dL 9.2  9.1  9.2   Total Protein 6.5 - 8.1 g/dL 6.9  8.3  7.0   Total Bilirubin 0.3 - 1.2 mg/dL 0.4  0.3  0.3   Alkaline Phos 38 - 126 U/L 165  151  161   AST 15 - 41 U/L 25  32  28   ALT 0 - 44 U/L  27  27  29        RADIOGRAPHIC STUDIES: I have personally reviewed the radiological images as listed and agreed with the findings in the report. No results found.    Orders Placed This Encounter  Procedures   DG Bone Density    Standing Status:   Future    Standing Expiration Date:   03/10/2024    Order Specific Question:   Reason for Exam (SYMPTOM  OR DIAGNOSIS REQUIRED)    Answer:   screening    Order Specific Question:   Preferred imaging location?    Answer:   External   All questions were answered. The patient knows to call the clinic with any problems, questions or concerns. No barriers to learning was detected. The total time spent in the appointment was 25 minutes.     Malachy Mood, MD 03/11/2023   Carolin Coy, CMA, am acting as scribe for Malachy Mood, MD.   I have reviewed the above documentation for  accuracy and completeness, and I agree with the above.

## 2023-03-12 ENCOUNTER — Telehealth: Payer: Self-pay | Admitting: Radiation Oncology

## 2023-03-12 ENCOUNTER — Ambulatory Visit: Payer: Medicare Other

## 2023-03-12 NOTE — Telephone Encounter (Signed)
7/19 @ 10:51 am patient returned call to confirm after getting voicemail that her treatment has been canceled for today.  Patient is aware not to come in.

## 2023-03-15 ENCOUNTER — Other Ambulatory Visit: Payer: Self-pay

## 2023-03-15 ENCOUNTER — Ambulatory Visit
Admission: RE | Admit: 2023-03-15 | Discharge: 2023-03-15 | Disposition: A | Discharge status: Home / Self Care | Payer: Medicare Other | Source: Ambulatory Visit | Attending: Radiation Oncology | Admitting: Radiation Oncology

## 2023-03-15 ENCOUNTER — Ambulatory Visit: Admission: RE | Admit: 2023-03-15 | Payer: Medicare Other | Source: Ambulatory Visit

## 2023-03-15 DIAGNOSIS — Z79620 Long term (current) use of immunosuppressive biologic: Secondary | ICD-10-CM | POA: Diagnosis not present

## 2023-03-15 DIAGNOSIS — Z170 Estrogen receptor positive status [ER+]: Secondary | ICD-10-CM | POA: Diagnosis not present

## 2023-03-15 DIAGNOSIS — T451X5D Adverse effect of antineoplastic and immunosuppressive drugs, subsequent encounter: Secondary | ICD-10-CM | POA: Diagnosis not present

## 2023-03-15 DIAGNOSIS — Z7952 Long term (current) use of systemic steroids: Secondary | ICD-10-CM | POA: Diagnosis not present

## 2023-03-15 DIAGNOSIS — Z510 Encounter for antineoplastic radiation therapy: Secondary | ICD-10-CM | POA: Diagnosis not present

## 2023-03-15 DIAGNOSIS — C50412 Malignant neoplasm of upper-outer quadrant of left female breast: Secondary | ICD-10-CM

## 2023-03-15 DIAGNOSIS — Z79624 Long term (current) use of inhibitors of nucleotide synthesis: Secondary | ICD-10-CM | POA: Diagnosis not present

## 2023-03-15 DIAGNOSIS — G7000 Myasthenia gravis without (acute) exacerbation: Secondary | ICD-10-CM | POA: Diagnosis not present

## 2023-03-15 DIAGNOSIS — Z7982 Long term (current) use of aspirin: Secondary | ICD-10-CM | POA: Diagnosis not present

## 2023-03-15 DIAGNOSIS — Z8673 Personal history of transient ischemic attack (TIA), and cerebral infarction without residual deficits: Secondary | ICD-10-CM | POA: Diagnosis not present

## 2023-03-15 DIAGNOSIS — G620 Drug-induced polyneuropathy: Secondary | ICD-10-CM | POA: Diagnosis not present

## 2023-03-15 LAB — RAD ONC ARIA SESSION SUMMARY
Course Elapsed Days: 7
Plan Fractions Treated to Date: 5
Plan Prescribed Dose Per Fraction: 2.66 Gy
Plan Total Fractions Prescribed: 16
Plan Total Prescribed Dose: 42.56 Gy
Reference Point Dosage Given to Date: 13.3 Gy
Reference Point Session Dosage Given: 2.66 Gy
Session Number: 5

## 2023-03-15 MED ORDER — RADIAPLEXRX EX GEL: Freq: Once | CUTANEOUS | Status: AC

## 2023-03-15 MED ORDER — ALRA NON-METALLIC DEODORANT (RAD-ONC)
1.0000 | Freq: Once | TOPICAL | Status: AC
  Administered 2023-03-15: 1 via TOPICAL

## 2023-03-16 ENCOUNTER — Ambulatory Visit: Payer: Medicare Other

## 2023-03-16 ENCOUNTER — Other Ambulatory Visit: Payer: Self-pay

## 2023-03-16 DIAGNOSIS — G7000 Myasthenia gravis without (acute) exacerbation: Secondary | ICD-10-CM | POA: Diagnosis not present

## 2023-03-16 DIAGNOSIS — Z79620 Long term (current) use of immunosuppressive biologic: Secondary | ICD-10-CM | POA: Diagnosis not present

## 2023-03-16 DIAGNOSIS — Z170 Estrogen receptor positive status [ER+]: Secondary | ICD-10-CM | POA: Diagnosis not present

## 2023-03-16 DIAGNOSIS — C50412 Malignant neoplasm of upper-outer quadrant of left female breast: Secondary | ICD-10-CM | POA: Diagnosis not present

## 2023-03-16 DIAGNOSIS — T451X5D Adverse effect of antineoplastic and immunosuppressive drugs, subsequent encounter: Secondary | ICD-10-CM | POA: Diagnosis not present

## 2023-03-16 DIAGNOSIS — Z510 Encounter for antineoplastic radiation therapy: Secondary | ICD-10-CM | POA: Diagnosis not present

## 2023-03-16 DIAGNOSIS — Z7982 Long term (current) use of aspirin: Secondary | ICD-10-CM | POA: Diagnosis not present

## 2023-03-16 DIAGNOSIS — G620 Drug-induced polyneuropathy: Secondary | ICD-10-CM | POA: Diagnosis not present

## 2023-03-16 DIAGNOSIS — Z79624 Long term (current) use of inhibitors of nucleotide synthesis: Secondary | ICD-10-CM | POA: Diagnosis not present

## 2023-03-16 DIAGNOSIS — Z7952 Long term (current) use of systemic steroids: Secondary | ICD-10-CM | POA: Diagnosis not present

## 2023-03-16 DIAGNOSIS — Z8673 Personal history of transient ischemic attack (TIA), and cerebral infarction without residual deficits: Secondary | ICD-10-CM | POA: Diagnosis not present

## 2023-03-16 LAB — RAD ONC ARIA SESSION SUMMARY
Course Elapsed Days: 8
Plan Fractions Treated to Date: 6
Plan Prescribed Dose Per Fraction: 2.66 Gy
Plan Total Fractions Prescribed: 16
Plan Total Prescribed Dose: 42.56 Gy
Reference Point Dosage Given to Date: 15.96 Gy
Reference Point Session Dosage Given: 2.66 Gy
Session Number: 6

## 2023-03-17 ENCOUNTER — Other Ambulatory Visit: Payer: Self-pay

## 2023-03-17 ENCOUNTER — Ambulatory Visit
Admission: RE | Admit: 2023-03-17 | Discharge: 2023-03-17 | Disposition: A | Discharge status: Home / Self Care | Payer: Medicare Other | Source: Ambulatory Visit | Attending: Radiation Oncology | Admitting: Radiation Oncology

## 2023-03-17 DIAGNOSIS — Z7982 Long term (current) use of aspirin: Secondary | ICD-10-CM | POA: Diagnosis not present

## 2023-03-17 DIAGNOSIS — Z79624 Long term (current) use of inhibitors of nucleotide synthesis: Secondary | ICD-10-CM | POA: Diagnosis not present

## 2023-03-17 DIAGNOSIS — T451X5D Adverse effect of antineoplastic and immunosuppressive drugs, subsequent encounter: Secondary | ICD-10-CM | POA: Diagnosis not present

## 2023-03-17 DIAGNOSIS — Z510 Encounter for antineoplastic radiation therapy: Secondary | ICD-10-CM | POA: Diagnosis not present

## 2023-03-17 DIAGNOSIS — G620 Drug-induced polyneuropathy: Secondary | ICD-10-CM | POA: Diagnosis not present

## 2023-03-17 DIAGNOSIS — Z7952 Long term (current) use of systemic steroids: Secondary | ICD-10-CM | POA: Diagnosis not present

## 2023-03-17 DIAGNOSIS — G7000 Myasthenia gravis without (acute) exacerbation: Secondary | ICD-10-CM | POA: Diagnosis not present

## 2023-03-17 DIAGNOSIS — C50412 Malignant neoplasm of upper-outer quadrant of left female breast: Secondary | ICD-10-CM | POA: Diagnosis not present

## 2023-03-17 DIAGNOSIS — Z79620 Long term (current) use of immunosuppressive biologic: Secondary | ICD-10-CM | POA: Diagnosis not present

## 2023-03-17 DIAGNOSIS — Z8673 Personal history of transient ischemic attack (TIA), and cerebral infarction without residual deficits: Secondary | ICD-10-CM | POA: Diagnosis not present

## 2023-03-17 DIAGNOSIS — Z170 Estrogen receptor positive status [ER+]: Secondary | ICD-10-CM | POA: Diagnosis not present

## 2023-03-17 LAB — RAD ONC ARIA SESSION SUMMARY
Course Elapsed Days: 9
Plan Fractions Treated to Date: 7
Plan Prescribed Dose Per Fraction: 2.66 Gy
Plan Total Fractions Prescribed: 16
Plan Total Prescribed Dose: 42.56 Gy
Reference Point Dosage Given to Date: 18.62 Gy
Reference Point Session Dosage Given: 2.66 Gy
Session Number: 7

## 2023-03-18 ENCOUNTER — Other Ambulatory Visit: Payer: Self-pay

## 2023-03-18 ENCOUNTER — Ambulatory Visit
Admission: RE | Admit: 2023-03-18 | Discharge: 2023-03-18 | Disposition: A | Discharge status: Home / Self Care | Payer: Medicare Other | Source: Ambulatory Visit | Attending: Radiation Oncology | Admitting: Radiation Oncology

## 2023-03-18 DIAGNOSIS — Z8673 Personal history of transient ischemic attack (TIA), and cerebral infarction without residual deficits: Secondary | ICD-10-CM | POA: Diagnosis not present

## 2023-03-18 DIAGNOSIS — T451X5D Adverse effect of antineoplastic and immunosuppressive drugs, subsequent encounter: Secondary | ICD-10-CM | POA: Diagnosis not present

## 2023-03-18 DIAGNOSIS — Z7982 Long term (current) use of aspirin: Secondary | ICD-10-CM | POA: Diagnosis not present

## 2023-03-18 DIAGNOSIS — G620 Drug-induced polyneuropathy: Secondary | ICD-10-CM | POA: Diagnosis not present

## 2023-03-18 DIAGNOSIS — C50412 Malignant neoplasm of upper-outer quadrant of left female breast: Secondary | ICD-10-CM | POA: Diagnosis not present

## 2023-03-18 DIAGNOSIS — G7000 Myasthenia gravis without (acute) exacerbation: Secondary | ICD-10-CM | POA: Diagnosis not present

## 2023-03-18 DIAGNOSIS — Z510 Encounter for antineoplastic radiation therapy: Secondary | ICD-10-CM | POA: Diagnosis not present

## 2023-03-18 DIAGNOSIS — Z7952 Long term (current) use of systemic steroids: Secondary | ICD-10-CM | POA: Diagnosis not present

## 2023-03-18 DIAGNOSIS — Z170 Estrogen receptor positive status [ER+]: Secondary | ICD-10-CM | POA: Diagnosis not present

## 2023-03-18 DIAGNOSIS — Z79620 Long term (current) use of immunosuppressive biologic: Secondary | ICD-10-CM | POA: Diagnosis not present

## 2023-03-18 DIAGNOSIS — Z79624 Long term (current) use of inhibitors of nucleotide synthesis: Secondary | ICD-10-CM | POA: Diagnosis not present

## 2023-03-18 LAB — RAD ONC ARIA SESSION SUMMARY
Course Elapsed Days: 10
Plan Fractions Treated to Date: 8
Plan Prescribed Dose Per Fraction: 2.66 Gy
Plan Total Fractions Prescribed: 16
Plan Total Prescribed Dose: 42.56 Gy
Reference Point Dosage Given to Date: 21.28 Gy
Reference Point Session Dosage Given: 2.66 Gy
Session Number: 8

## 2023-03-19 ENCOUNTER — Other Ambulatory Visit: Payer: Self-pay

## 2023-03-19 ENCOUNTER — Ambulatory Visit: Admission: RE | Admit: 2023-03-19 | Payer: Medicare Other | Source: Ambulatory Visit

## 2023-03-19 ENCOUNTER — Ambulatory Visit
Admission: RE | Admit: 2023-03-19 | Discharge: 2023-03-19 | Disposition: A | Discharge status: Home / Self Care | Payer: Medicare Other | Source: Ambulatory Visit | Attending: Radiation Oncology | Admitting: Radiation Oncology

## 2023-03-19 DIAGNOSIS — C50412 Malignant neoplasm of upper-outer quadrant of left female breast: Secondary | ICD-10-CM | POA: Diagnosis not present

## 2023-03-19 DIAGNOSIS — Z79624 Long term (current) use of inhibitors of nucleotide synthesis: Secondary | ICD-10-CM | POA: Diagnosis not present

## 2023-03-19 DIAGNOSIS — G7000 Myasthenia gravis without (acute) exacerbation: Secondary | ICD-10-CM | POA: Diagnosis not present

## 2023-03-19 DIAGNOSIS — Z79620 Long term (current) use of immunosuppressive biologic: Secondary | ICD-10-CM | POA: Diagnosis not present

## 2023-03-19 DIAGNOSIS — Z170 Estrogen receptor positive status [ER+]: Secondary | ICD-10-CM | POA: Diagnosis not present

## 2023-03-19 DIAGNOSIS — Z510 Encounter for antineoplastic radiation therapy: Secondary | ICD-10-CM | POA: Diagnosis not present

## 2023-03-19 DIAGNOSIS — G620 Drug-induced polyneuropathy: Secondary | ICD-10-CM | POA: Diagnosis not present

## 2023-03-19 DIAGNOSIS — Z8673 Personal history of transient ischemic attack (TIA), and cerebral infarction without residual deficits: Secondary | ICD-10-CM | POA: Diagnosis not present

## 2023-03-19 DIAGNOSIS — Z7952 Long term (current) use of systemic steroids: Secondary | ICD-10-CM | POA: Diagnosis not present

## 2023-03-19 DIAGNOSIS — Z7982 Long term (current) use of aspirin: Secondary | ICD-10-CM | POA: Diagnosis not present

## 2023-03-19 DIAGNOSIS — T451X5D Adverse effect of antineoplastic and immunosuppressive drugs, subsequent encounter: Secondary | ICD-10-CM | POA: Diagnosis not present

## 2023-03-19 LAB — RAD ONC ARIA SESSION SUMMARY
Course Elapsed Days: 11
Plan Fractions Treated to Date: 9
Plan Prescribed Dose Per Fraction: 2.66 Gy
Plan Total Fractions Prescribed: 16
Plan Total Prescribed Dose: 42.56 Gy
Reference Point Dosage Given to Date: 23.94 Gy
Reference Point Session Dosage Given: 2.66 Gy
Session Number: 9

## 2023-03-22 ENCOUNTER — Other Ambulatory Visit: Payer: Self-pay

## 2023-03-22 ENCOUNTER — Ambulatory Visit
Admission: RE | Admit: 2023-03-22 | Discharge: 2023-03-22 | Disposition: A | Discharge status: Home / Self Care | Payer: Medicare Other | Source: Ambulatory Visit | Attending: Radiation Oncology | Admitting: Radiation Oncology

## 2023-03-22 ENCOUNTER — Encounter: Payer: Self-pay | Admitting: *Deleted

## 2023-03-22 DIAGNOSIS — Z170 Estrogen receptor positive status [ER+]: Secondary | ICD-10-CM | POA: Diagnosis not present

## 2023-03-22 DIAGNOSIS — Z79620 Long term (current) use of immunosuppressive biologic: Secondary | ICD-10-CM | POA: Diagnosis not present

## 2023-03-22 DIAGNOSIS — C50412 Malignant neoplasm of upper-outer quadrant of left female breast: Secondary | ICD-10-CM | POA: Diagnosis not present

## 2023-03-22 DIAGNOSIS — G620 Drug-induced polyneuropathy: Secondary | ICD-10-CM | POA: Diagnosis not present

## 2023-03-22 DIAGNOSIS — Z510 Encounter for antineoplastic radiation therapy: Secondary | ICD-10-CM | POA: Diagnosis not present

## 2023-03-22 DIAGNOSIS — Z8673 Personal history of transient ischemic attack (TIA), and cerebral infarction without residual deficits: Secondary | ICD-10-CM | POA: Diagnosis not present

## 2023-03-22 DIAGNOSIS — G7000 Myasthenia gravis without (acute) exacerbation: Secondary | ICD-10-CM | POA: Diagnosis not present

## 2023-03-22 DIAGNOSIS — Z79624 Long term (current) use of inhibitors of nucleotide synthesis: Secondary | ICD-10-CM | POA: Diagnosis not present

## 2023-03-22 DIAGNOSIS — Z7952 Long term (current) use of systemic steroids: Secondary | ICD-10-CM | POA: Diagnosis not present

## 2023-03-22 DIAGNOSIS — Z7982 Long term (current) use of aspirin: Secondary | ICD-10-CM | POA: Diagnosis not present

## 2023-03-22 DIAGNOSIS — T451X5D Adverse effect of antineoplastic and immunosuppressive drugs, subsequent encounter: Secondary | ICD-10-CM | POA: Diagnosis not present

## 2023-03-22 LAB — RAD ONC ARIA SESSION SUMMARY
Course Elapsed Days: 14
Plan Fractions Treated to Date: 10
Plan Prescribed Dose Per Fraction: 2.66 Gy
Plan Total Fractions Prescribed: 16
Plan Total Prescribed Dose: 42.56 Gy
Reference Point Dosage Given to Date: 26.6 Gy
Reference Point Session Dosage Given: 2.66 Gy
Session Number: 10

## 2023-03-22 NOTE — Progress Notes (Signed)
Patient called in checking to see if her Bone Density had been sent to [facility]. I faxed it over to them. Called patient and told her to give it 24 hrs and check back with them to schedule it.

## 2023-03-23 ENCOUNTER — Other Ambulatory Visit: Payer: Self-pay

## 2023-03-23 ENCOUNTER — Ambulatory Visit
Admission: RE | Admit: 2023-03-23 | Discharge: 2023-03-23 | Disposition: A | Discharge status: Home / Self Care | Payer: Medicare Other | Source: Ambulatory Visit | Attending: Radiation Oncology | Admitting: Radiation Oncology

## 2023-03-23 DIAGNOSIS — Z7952 Long term (current) use of systemic steroids: Secondary | ICD-10-CM | POA: Diagnosis not present

## 2023-03-23 DIAGNOSIS — G7000 Myasthenia gravis without (acute) exacerbation: Secondary | ICD-10-CM | POA: Diagnosis not present

## 2023-03-23 DIAGNOSIS — G620 Drug-induced polyneuropathy: Secondary | ICD-10-CM | POA: Diagnosis not present

## 2023-03-23 DIAGNOSIS — C50412 Malignant neoplasm of upper-outer quadrant of left female breast: Secondary | ICD-10-CM | POA: Diagnosis not present

## 2023-03-23 DIAGNOSIS — Z7982 Long term (current) use of aspirin: Secondary | ICD-10-CM | POA: Diagnosis not present

## 2023-03-23 DIAGNOSIS — Z170 Estrogen receptor positive status [ER+]: Secondary | ICD-10-CM | POA: Diagnosis not present

## 2023-03-23 DIAGNOSIS — T451X5D Adverse effect of antineoplastic and immunosuppressive drugs, subsequent encounter: Secondary | ICD-10-CM | POA: Diagnosis not present

## 2023-03-23 DIAGNOSIS — Z79624 Long term (current) use of inhibitors of nucleotide synthesis: Secondary | ICD-10-CM | POA: Diagnosis not present

## 2023-03-23 DIAGNOSIS — Z510 Encounter for antineoplastic radiation therapy: Secondary | ICD-10-CM | POA: Diagnosis not present

## 2023-03-23 DIAGNOSIS — Z79620 Long term (current) use of immunosuppressive biologic: Secondary | ICD-10-CM | POA: Diagnosis not present

## 2023-03-23 DIAGNOSIS — Z8673 Personal history of transient ischemic attack (TIA), and cerebral infarction without residual deficits: Secondary | ICD-10-CM | POA: Diagnosis not present

## 2023-03-23 LAB — RAD ONC ARIA SESSION SUMMARY
Course Elapsed Days: 15
Plan Fractions Treated to Date: 11
Plan Prescribed Dose Per Fraction: 2.66 Gy
Plan Total Fractions Prescribed: 16
Plan Total Prescribed Dose: 42.56 Gy
Reference Point Dosage Given to Date: 29.26 Gy
Reference Point Session Dosage Given: 2.66 Gy
Session Number: 11

## 2023-03-24 ENCOUNTER — Other Ambulatory Visit: Payer: Self-pay

## 2023-03-24 ENCOUNTER — Ambulatory Visit
Admission: RE | Admit: 2023-03-24 | Discharge: 2023-03-24 | Disposition: A | Discharge status: Home / Self Care | Payer: Medicare Other | Source: Ambulatory Visit | Attending: Radiation Oncology | Admitting: Radiation Oncology

## 2023-03-24 DIAGNOSIS — Z7952 Long term (current) use of systemic steroids: Secondary | ICD-10-CM | POA: Diagnosis not present

## 2023-03-24 DIAGNOSIS — C50412 Malignant neoplasm of upper-outer quadrant of left female breast: Secondary | ICD-10-CM | POA: Diagnosis not present

## 2023-03-24 DIAGNOSIS — Z7982 Long term (current) use of aspirin: Secondary | ICD-10-CM | POA: Diagnosis not present

## 2023-03-24 DIAGNOSIS — G7000 Myasthenia gravis without (acute) exacerbation: Secondary | ICD-10-CM | POA: Diagnosis not present

## 2023-03-24 DIAGNOSIS — Z510 Encounter for antineoplastic radiation therapy: Secondary | ICD-10-CM | POA: Diagnosis not present

## 2023-03-24 DIAGNOSIS — Z8673 Personal history of transient ischemic attack (TIA), and cerebral infarction without residual deficits: Secondary | ICD-10-CM | POA: Diagnosis not present

## 2023-03-24 DIAGNOSIS — Z170 Estrogen receptor positive status [ER+]: Secondary | ICD-10-CM | POA: Diagnosis not present

## 2023-03-24 DIAGNOSIS — G620 Drug-induced polyneuropathy: Secondary | ICD-10-CM | POA: Diagnosis not present

## 2023-03-24 DIAGNOSIS — Z79620 Long term (current) use of immunosuppressive biologic: Secondary | ICD-10-CM | POA: Diagnosis not present

## 2023-03-24 DIAGNOSIS — T451X5D Adverse effect of antineoplastic and immunosuppressive drugs, subsequent encounter: Secondary | ICD-10-CM | POA: Diagnosis not present

## 2023-03-24 DIAGNOSIS — Z79624 Long term (current) use of inhibitors of nucleotide synthesis: Secondary | ICD-10-CM | POA: Diagnosis not present

## 2023-03-24 LAB — RAD ONC ARIA SESSION SUMMARY
Course Elapsed Days: 16
Plan Fractions Treated to Date: 12
Plan Prescribed Dose Per Fraction: 2.66 Gy
Plan Total Fractions Prescribed: 16
Plan Total Prescribed Dose: 42.56 Gy
Reference Point Dosage Given to Date: 31.92 Gy
Reference Point Session Dosage Given: 2.66 Gy
Session Number: 12

## 2023-03-25 ENCOUNTER — Other Ambulatory Visit: Payer: Self-pay

## 2023-03-25 ENCOUNTER — Ambulatory Visit
Admission: RE | Admit: 2023-03-25 | Discharge: 2023-03-25 | Disposition: A | Discharge status: Home / Self Care | Payer: Medicare Other | Source: Ambulatory Visit | Attending: Radiation Oncology | Admitting: Radiation Oncology

## 2023-03-25 DIAGNOSIS — Z510 Encounter for antineoplastic radiation therapy: Secondary | ICD-10-CM | POA: Insufficient documentation

## 2023-03-25 DIAGNOSIS — Z79624 Long term (current) use of inhibitors of nucleotide synthesis: Secondary | ICD-10-CM | POA: Insufficient documentation

## 2023-03-25 DIAGNOSIS — Z8673 Personal history of transient ischemic attack (TIA), and cerebral infarction without residual deficits: Secondary | ICD-10-CM | POA: Insufficient documentation

## 2023-03-25 DIAGNOSIS — Z170 Estrogen receptor positive status [ER+]: Secondary | ICD-10-CM | POA: Insufficient documentation

## 2023-03-25 DIAGNOSIS — Z7952 Long term (current) use of systemic steroids: Secondary | ICD-10-CM | POA: Diagnosis not present

## 2023-03-25 DIAGNOSIS — Z79620 Long term (current) use of immunosuppressive biologic: Secondary | ICD-10-CM | POA: Insufficient documentation

## 2023-03-25 DIAGNOSIS — C50412 Malignant neoplasm of upper-outer quadrant of left female breast: Secondary | ICD-10-CM | POA: Insufficient documentation

## 2023-03-25 DIAGNOSIS — G7000 Myasthenia gravis without (acute) exacerbation: Secondary | ICD-10-CM | POA: Insufficient documentation

## 2023-03-25 DIAGNOSIS — G620 Drug-induced polyneuropathy: Secondary | ICD-10-CM | POA: Insufficient documentation

## 2023-03-25 DIAGNOSIS — Z7982 Long term (current) use of aspirin: Secondary | ICD-10-CM | POA: Diagnosis not present

## 2023-03-25 DIAGNOSIS — T451X5D Adverse effect of antineoplastic and immunosuppressive drugs, subsequent encounter: Secondary | ICD-10-CM | POA: Diagnosis not present

## 2023-03-25 LAB — RAD ONC ARIA SESSION SUMMARY
Course Elapsed Days: 17
Plan Fractions Treated to Date: 13
Plan Prescribed Dose Per Fraction: 2.66 Gy
Plan Total Fractions Prescribed: 16
Plan Total Prescribed Dose: 42.56 Gy
Reference Point Dosage Given to Date: 34.58 Gy
Reference Point Session Dosage Given: 2.66 Gy
Session Number: 13

## 2023-03-26 ENCOUNTER — Other Ambulatory Visit: Payer: Self-pay

## 2023-03-26 ENCOUNTER — Ambulatory Visit
Admission: RE | Admit: 2023-03-26 | Discharge: 2023-03-26 | Disposition: A | Discharge status: Home / Self Care | Payer: Medicare Other | Source: Ambulatory Visit | Attending: Radiation Oncology | Admitting: Radiation Oncology

## 2023-03-26 ENCOUNTER — Ambulatory Visit: Payer: Medicare Other | Admitting: Radiation Oncology

## 2023-03-26 DIAGNOSIS — T451X5D Adverse effect of antineoplastic and immunosuppressive drugs, subsequent encounter: Secondary | ICD-10-CM | POA: Diagnosis not present

## 2023-03-26 DIAGNOSIS — G620 Drug-induced polyneuropathy: Secondary | ICD-10-CM | POA: Diagnosis not present

## 2023-03-26 DIAGNOSIS — Z8673 Personal history of transient ischemic attack (TIA), and cerebral infarction without residual deficits: Secondary | ICD-10-CM | POA: Diagnosis not present

## 2023-03-26 DIAGNOSIS — Z510 Encounter for antineoplastic radiation therapy: Secondary | ICD-10-CM | POA: Diagnosis not present

## 2023-03-26 DIAGNOSIS — Z79624 Long term (current) use of inhibitors of nucleotide synthesis: Secondary | ICD-10-CM | POA: Diagnosis not present

## 2023-03-26 DIAGNOSIS — Z79620 Long term (current) use of immunosuppressive biologic: Secondary | ICD-10-CM | POA: Diagnosis not present

## 2023-03-26 DIAGNOSIS — Z170 Estrogen receptor positive status [ER+]: Secondary | ICD-10-CM | POA: Diagnosis not present

## 2023-03-26 DIAGNOSIS — Z7952 Long term (current) use of systemic steroids: Secondary | ICD-10-CM | POA: Diagnosis not present

## 2023-03-26 DIAGNOSIS — C50412 Malignant neoplasm of upper-outer quadrant of left female breast: Secondary | ICD-10-CM | POA: Diagnosis not present

## 2023-03-26 DIAGNOSIS — J45909 Unspecified asthma, uncomplicated: Secondary | ICD-10-CM | POA: Diagnosis not present

## 2023-03-26 DIAGNOSIS — G7000 Myasthenia gravis without (acute) exacerbation: Secondary | ICD-10-CM | POA: Diagnosis not present

## 2023-03-26 DIAGNOSIS — Z7982 Long term (current) use of aspirin: Secondary | ICD-10-CM | POA: Diagnosis not present

## 2023-03-26 DIAGNOSIS — G4733 Obstructive sleep apnea (adult) (pediatric): Secondary | ICD-10-CM | POA: Diagnosis not present

## 2023-03-26 LAB — RAD ONC ARIA SESSION SUMMARY
Course Elapsed Days: 18
Plan Fractions Treated to Date: 14
Plan Prescribed Dose Per Fraction: 2.66 Gy
Plan Total Fractions Prescribed: 16
Plan Total Prescribed Dose: 42.56 Gy
Reference Point Dosage Given to Date: 37.24 Gy
Reference Point Session Dosage Given: 2.66 Gy
Session Number: 14

## 2023-03-29 ENCOUNTER — Ambulatory Visit
Admission: RE | Admit: 2023-03-29 | Discharge: 2023-03-29 | Disposition: A | Discharge status: Home / Self Care | Payer: Medicare Other | Source: Ambulatory Visit | Attending: Radiation Oncology | Admitting: Radiation Oncology

## 2023-03-29 ENCOUNTER — Other Ambulatory Visit: Payer: Self-pay

## 2023-03-29 DIAGNOSIS — Z170 Estrogen receptor positive status [ER+]: Secondary | ICD-10-CM | POA: Diagnosis not present

## 2023-03-29 DIAGNOSIS — C50412 Malignant neoplasm of upper-outer quadrant of left female breast: Secondary | ICD-10-CM | POA: Diagnosis not present

## 2023-03-29 DIAGNOSIS — Z7982 Long term (current) use of aspirin: Secondary | ICD-10-CM | POA: Diagnosis not present

## 2023-03-29 DIAGNOSIS — Z510 Encounter for antineoplastic radiation therapy: Secondary | ICD-10-CM | POA: Diagnosis not present

## 2023-03-29 DIAGNOSIS — G7000 Myasthenia gravis without (acute) exacerbation: Secondary | ICD-10-CM | POA: Diagnosis not present

## 2023-03-29 DIAGNOSIS — Z79624 Long term (current) use of inhibitors of nucleotide synthesis: Secondary | ICD-10-CM | POA: Diagnosis not present

## 2023-03-29 DIAGNOSIS — Z7952 Long term (current) use of systemic steroids: Secondary | ICD-10-CM | POA: Diagnosis not present

## 2023-03-29 DIAGNOSIS — Z79620 Long term (current) use of immunosuppressive biologic: Secondary | ICD-10-CM | POA: Diagnosis not present

## 2023-03-29 DIAGNOSIS — Z8673 Personal history of transient ischemic attack (TIA), and cerebral infarction without residual deficits: Secondary | ICD-10-CM | POA: Diagnosis not present

## 2023-03-29 DIAGNOSIS — T451X5D Adverse effect of antineoplastic and immunosuppressive drugs, subsequent encounter: Secondary | ICD-10-CM | POA: Diagnosis not present

## 2023-03-29 DIAGNOSIS — G620 Drug-induced polyneuropathy: Secondary | ICD-10-CM | POA: Diagnosis not present

## 2023-03-29 LAB — RAD ONC ARIA SESSION SUMMARY
Course Elapsed Days: 21
Plan Fractions Treated to Date: 15
Plan Prescribed Dose Per Fraction: 2.66 Gy
Plan Total Fractions Prescribed: 16
Plan Total Prescribed Dose: 42.56 Gy
Reference Point Dosage Given to Date: 39.9 Gy
Reference Point Session Dosage Given: 2.66 Gy
Session Number: 15

## 2023-03-30 ENCOUNTER — Other Ambulatory Visit: Payer: Self-pay

## 2023-03-30 ENCOUNTER — Ambulatory Visit: Payer: Medicare Other

## 2023-03-30 ENCOUNTER — Ambulatory Visit
Admission: RE | Admit: 2023-03-30 | Discharge: 2023-03-30 | Disposition: A | Discharge status: Home / Self Care | Payer: Medicare Other | Source: Ambulatory Visit | Attending: Radiation Oncology | Admitting: Radiation Oncology

## 2023-03-30 DIAGNOSIS — T451X5D Adverse effect of antineoplastic and immunosuppressive drugs, subsequent encounter: Secondary | ICD-10-CM | POA: Diagnosis not present

## 2023-03-30 DIAGNOSIS — Z79620 Long term (current) use of immunosuppressive biologic: Secondary | ICD-10-CM | POA: Diagnosis not present

## 2023-03-30 DIAGNOSIS — Z170 Estrogen receptor positive status [ER+]: Secondary | ICD-10-CM | POA: Diagnosis not present

## 2023-03-30 DIAGNOSIS — Z8673 Personal history of transient ischemic attack (TIA), and cerebral infarction without residual deficits: Secondary | ICD-10-CM | POA: Diagnosis not present

## 2023-03-30 DIAGNOSIS — Z510 Encounter for antineoplastic radiation therapy: Secondary | ICD-10-CM | POA: Diagnosis not present

## 2023-03-30 DIAGNOSIS — C50412 Malignant neoplasm of upper-outer quadrant of left female breast: Secondary | ICD-10-CM | POA: Diagnosis not present

## 2023-03-30 DIAGNOSIS — Z7952 Long term (current) use of systemic steroids: Secondary | ICD-10-CM | POA: Diagnosis not present

## 2023-03-30 DIAGNOSIS — Z7982 Long term (current) use of aspirin: Secondary | ICD-10-CM | POA: Diagnosis not present

## 2023-03-30 DIAGNOSIS — G7000 Myasthenia gravis without (acute) exacerbation: Secondary | ICD-10-CM | POA: Diagnosis not present

## 2023-03-30 DIAGNOSIS — G620 Drug-induced polyneuropathy: Secondary | ICD-10-CM | POA: Diagnosis not present

## 2023-03-30 DIAGNOSIS — Z79624 Long term (current) use of inhibitors of nucleotide synthesis: Secondary | ICD-10-CM | POA: Diagnosis not present

## 2023-03-30 LAB — RAD ONC ARIA SESSION SUMMARY
Course Elapsed Days: 22
Plan Fractions Treated to Date: 16
Plan Prescribed Dose Per Fraction: 2.66 Gy
Plan Total Fractions Prescribed: 16
Plan Total Prescribed Dose: 42.56 Gy
Reference Point Dosage Given to Date: 42.56 Gy
Reference Point Session Dosage Given: 2.66 Gy
Session Number: 16

## 2023-03-31 ENCOUNTER — Telehealth: Payer: Self-pay | Admitting: Radiation Oncology

## 2023-03-31 ENCOUNTER — Other Ambulatory Visit: Payer: Self-pay

## 2023-03-31 ENCOUNTER — Ambulatory Visit: Payer: Medicare Other

## 2023-03-31 ENCOUNTER — Ambulatory Visit
Admission: RE | Admit: 2023-03-31 | Discharge: 2023-03-31 | Disposition: A | Discharge status: Home / Self Care | Payer: Medicare Other | Source: Ambulatory Visit | Attending: Radiation Oncology | Admitting: Radiation Oncology

## 2023-03-31 DIAGNOSIS — Z79620 Long term (current) use of immunosuppressive biologic: Secondary | ICD-10-CM | POA: Diagnosis not present

## 2023-03-31 DIAGNOSIS — G620 Drug-induced polyneuropathy: Secondary | ICD-10-CM | POA: Diagnosis not present

## 2023-03-31 DIAGNOSIS — Z510 Encounter for antineoplastic radiation therapy: Secondary | ICD-10-CM | POA: Diagnosis not present

## 2023-03-31 DIAGNOSIS — Z853 Personal history of malignant neoplasm of breast: Secondary | ICD-10-CM | POA: Diagnosis not present

## 2023-03-31 DIAGNOSIS — G7000 Myasthenia gravis without (acute) exacerbation: Secondary | ICD-10-CM | POA: Diagnosis not present

## 2023-03-31 DIAGNOSIS — Z79624 Long term (current) use of inhibitors of nucleotide synthesis: Secondary | ICD-10-CM | POA: Diagnosis not present

## 2023-03-31 DIAGNOSIS — M8588 Other specified disorders of bone density and structure, other site: Secondary | ICD-10-CM | POA: Diagnosis not present

## 2023-03-31 DIAGNOSIS — Z8673 Personal history of transient ischemic attack (TIA), and cerebral infarction without residual deficits: Secondary | ICD-10-CM | POA: Diagnosis not present

## 2023-03-31 DIAGNOSIS — C50412 Malignant neoplasm of upper-outer quadrant of left female breast: Secondary | ICD-10-CM | POA: Diagnosis not present

## 2023-03-31 DIAGNOSIS — Z7982 Long term (current) use of aspirin: Secondary | ICD-10-CM | POA: Diagnosis not present

## 2023-03-31 DIAGNOSIS — Z7952 Long term (current) use of systemic steroids: Secondary | ICD-10-CM | POA: Diagnosis not present

## 2023-03-31 DIAGNOSIS — E349 Endocrine disorder, unspecified: Secondary | ICD-10-CM | POA: Diagnosis not present

## 2023-03-31 DIAGNOSIS — T451X5D Adverse effect of antineoplastic and immunosuppressive drugs, subsequent encounter: Secondary | ICD-10-CM | POA: Diagnosis not present

## 2023-03-31 DIAGNOSIS — Z170 Estrogen receptor positive status [ER+]: Secondary | ICD-10-CM | POA: Diagnosis not present

## 2023-03-31 LAB — RAD ONC ARIA SESSION SUMMARY
Course Elapsed Days: 23
Plan Fractions Treated to Date: 1
Plan Prescribed Dose Per Fraction: 2 Gy
Plan Total Fractions Prescribed: 4
Plan Total Prescribed Dose: 8 Gy
Reference Point Dosage Given to Date: 2 Gy
Reference Point Session Dosage Given: 2 Gy
Session Number: 17

## 2023-03-31 NOTE — Telephone Encounter (Signed)
8/7 @ 3:53 pm patient called to change her appointment 8/9 for an earlier time, due to her transportation has to go to a funeral.  Called spoke to [employer] (L2 machine) and then forward call as requested.

## 2023-04-01 ENCOUNTER — Other Ambulatory Visit: Payer: Self-pay

## 2023-04-01 ENCOUNTER — Ambulatory Visit
Admission: RE | Admit: 2023-04-01 | Discharge: 2023-04-01 | Disposition: A | Discharge status: Home / Self Care | Payer: Medicare Other | Source: Ambulatory Visit | Attending: Radiation Oncology | Admitting: Radiation Oncology

## 2023-04-01 DIAGNOSIS — G620 Drug-induced polyneuropathy: Secondary | ICD-10-CM | POA: Diagnosis not present

## 2023-04-01 DIAGNOSIS — Z170 Estrogen receptor positive status [ER+]: Secondary | ICD-10-CM | POA: Diagnosis not present

## 2023-04-01 DIAGNOSIS — Z7952 Long term (current) use of systemic steroids: Secondary | ICD-10-CM | POA: Diagnosis not present

## 2023-04-01 DIAGNOSIS — Z8673 Personal history of transient ischemic attack (TIA), and cerebral infarction without residual deficits: Secondary | ICD-10-CM | POA: Diagnosis not present

## 2023-04-01 DIAGNOSIS — G7000 Myasthenia gravis without (acute) exacerbation: Secondary | ICD-10-CM | POA: Diagnosis not present

## 2023-04-01 DIAGNOSIS — Z510 Encounter for antineoplastic radiation therapy: Secondary | ICD-10-CM | POA: Diagnosis not present

## 2023-04-01 DIAGNOSIS — Z7982 Long term (current) use of aspirin: Secondary | ICD-10-CM | POA: Diagnosis not present

## 2023-04-01 DIAGNOSIS — T451X5D Adverse effect of antineoplastic and immunosuppressive drugs, subsequent encounter: Secondary | ICD-10-CM | POA: Diagnosis not present

## 2023-04-01 DIAGNOSIS — Z79624 Long term (current) use of inhibitors of nucleotide synthesis: Secondary | ICD-10-CM | POA: Diagnosis not present

## 2023-04-01 DIAGNOSIS — Z79620 Long term (current) use of immunosuppressive biologic: Secondary | ICD-10-CM | POA: Diagnosis not present

## 2023-04-01 DIAGNOSIS — C50412 Malignant neoplasm of upper-outer quadrant of left female breast: Secondary | ICD-10-CM | POA: Diagnosis not present

## 2023-04-01 LAB — RAD ONC ARIA SESSION SUMMARY
Course Elapsed Days: 24
Plan Fractions Treated to Date: 2
Plan Prescribed Dose Per Fraction: 2 Gy
Plan Total Fractions Prescribed: 4
Plan Total Prescribed Dose: 8 Gy
Reference Point Dosage Given to Date: 4 Gy
Reference Point Session Dosage Given: 2 Gy
Session Number: 18

## 2023-04-02 ENCOUNTER — Ambulatory Visit
Admission: RE | Admit: 2023-04-02 | Discharge: 2023-04-02 | Disposition: A | Discharge status: Home / Self Care | Payer: Medicare Other | Source: Ambulatory Visit | Attending: Radiation Oncology | Admitting: Radiation Oncology

## 2023-04-02 ENCOUNTER — Other Ambulatory Visit: Payer: Self-pay

## 2023-04-02 ENCOUNTER — Ambulatory Visit: Payer: Medicare Other

## 2023-04-02 DIAGNOSIS — G7000 Myasthenia gravis without (acute) exacerbation: Secondary | ICD-10-CM | POA: Diagnosis not present

## 2023-04-02 DIAGNOSIS — Z510 Encounter for antineoplastic radiation therapy: Secondary | ICD-10-CM | POA: Diagnosis not present

## 2023-04-02 DIAGNOSIS — G620 Drug-induced polyneuropathy: Secondary | ICD-10-CM | POA: Diagnosis not present

## 2023-04-02 DIAGNOSIS — Z170 Estrogen receptor positive status [ER+]: Secondary | ICD-10-CM | POA: Diagnosis not present

## 2023-04-02 DIAGNOSIS — C50412 Malignant neoplasm of upper-outer quadrant of left female breast: Secondary | ICD-10-CM | POA: Diagnosis not present

## 2023-04-02 DIAGNOSIS — Z7952 Long term (current) use of systemic steroids: Secondary | ICD-10-CM | POA: Diagnosis not present

## 2023-04-02 DIAGNOSIS — Z79624 Long term (current) use of inhibitors of nucleotide synthesis: Secondary | ICD-10-CM | POA: Diagnosis not present

## 2023-04-02 DIAGNOSIS — Z8673 Personal history of transient ischemic attack (TIA), and cerebral infarction without residual deficits: Secondary | ICD-10-CM | POA: Diagnosis not present

## 2023-04-02 DIAGNOSIS — Z79620 Long term (current) use of immunosuppressive biologic: Secondary | ICD-10-CM | POA: Diagnosis not present

## 2023-04-02 DIAGNOSIS — Z7982 Long term (current) use of aspirin: Secondary | ICD-10-CM | POA: Diagnosis not present

## 2023-04-02 DIAGNOSIS — T451X5D Adverse effect of antineoplastic and immunosuppressive drugs, subsequent encounter: Secondary | ICD-10-CM | POA: Diagnosis not present

## 2023-04-02 LAB — RAD ONC ARIA SESSION SUMMARY
Course Elapsed Days: 25
Plan Fractions Treated to Date: 3
Plan Prescribed Dose Per Fraction: 2 Gy
Plan Total Fractions Prescribed: 4
Plan Total Prescribed Dose: 8 Gy
Reference Point Dosage Given to Date: 6 Gy
Reference Point Session Dosage Given: 2 Gy
Session Number: 19

## 2023-04-05 ENCOUNTER — Other Ambulatory Visit: Payer: Self-pay

## 2023-04-05 ENCOUNTER — Encounter: Payer: Self-pay | Admitting: Hematology

## 2023-04-05 ENCOUNTER — Ambulatory Visit
Admission: RE | Admit: 2023-04-05 | Discharge: 2023-04-05 | Disposition: A | Discharge status: Home / Self Care | Payer: Medicare Other | Source: Ambulatory Visit | Attending: Radiation Oncology | Admitting: Radiation Oncology

## 2023-04-05 DIAGNOSIS — G7000 Myasthenia gravis without (acute) exacerbation: Secondary | ICD-10-CM | POA: Diagnosis not present

## 2023-04-05 DIAGNOSIS — Z79620 Long term (current) use of immunosuppressive biologic: Secondary | ICD-10-CM | POA: Diagnosis not present

## 2023-04-05 DIAGNOSIS — G620 Drug-induced polyneuropathy: Secondary | ICD-10-CM | POA: Diagnosis not present

## 2023-04-05 DIAGNOSIS — Z7982 Long term (current) use of aspirin: Secondary | ICD-10-CM | POA: Diagnosis not present

## 2023-04-05 DIAGNOSIS — Z79624 Long term (current) use of inhibitors of nucleotide synthesis: Secondary | ICD-10-CM | POA: Diagnosis not present

## 2023-04-05 DIAGNOSIS — Z8673 Personal history of transient ischemic attack (TIA), and cerebral infarction without residual deficits: Secondary | ICD-10-CM | POA: Diagnosis not present

## 2023-04-05 DIAGNOSIS — T451X5D Adverse effect of antineoplastic and immunosuppressive drugs, subsequent encounter: Secondary | ICD-10-CM | POA: Diagnosis not present

## 2023-04-05 DIAGNOSIS — Z170 Estrogen receptor positive status [ER+]: Secondary | ICD-10-CM | POA: Diagnosis not present

## 2023-04-05 DIAGNOSIS — C50412 Malignant neoplasm of upper-outer quadrant of left female breast: Secondary | ICD-10-CM | POA: Diagnosis not present

## 2023-04-05 DIAGNOSIS — Z510 Encounter for antineoplastic radiation therapy: Secondary | ICD-10-CM | POA: Diagnosis not present

## 2023-04-05 DIAGNOSIS — Z7952 Long term (current) use of systemic steroids: Secondary | ICD-10-CM | POA: Diagnosis not present

## 2023-04-05 LAB — RAD ONC ARIA SESSION SUMMARY
Course Elapsed Days: 28
Plan Fractions Treated to Date: 4
Plan Prescribed Dose Per Fraction: 2 Gy
Plan Total Fractions Prescribed: 4
Plan Total Prescribed Dose: 8 Gy
Reference Point Dosage Given to Date: 8 Gy
Reference Point Session Dosage Given: 2 Gy
Session Number: 20

## 2023-04-06 NOTE — Radiation Completion Notes (Signed)
Patient Name: [name], [name] MRN: [id number] Date of Birth: April 23, 1951 Referring Physician: Malachy Mood, M.D. Date of Service: 2023-04-06 Radiation Oncologist: Dorothy Puffer, M.D.  [facility] [place]                             RADIATION ONCOLOGY END OF TREATMENT NOTE     Diagnosis: C50.412 Malignant neoplasm of upper-outer quadrant of left female breast Staging on 2022-09-02: Malignant neoplasm of upper-outer quadrant of left breast in female, estrogen receptor positive (HCC) T=pT1b, N=pN0, M=cM0 Staging on 2022-08-10: Malignant neoplasm of upper-outer quadrant of left breast in female, estrogen receptor positive (HCC) T=cT1b, N=cN0, M=cM0 Intent: Curative     ==========DELIVERED PLANS==========  First Treatment Date: 2023-03-08 - Last Treatment Date: 2023-04-05   Plan Name: Breast_L Site: Breast, Left Technique: 3D Mode: Photon Dose Per Fraction: 2.66 Gy Prescribed Dose (Delivered / Prescribed): 42.56 Gy / 42.56 Gy Prescribed Fxs (Delivered / Prescribed): 16 / 16   Plan Name: Breast_L_Bst Site: Breast, Left Technique: 3D Mode: Photon Dose Per Fraction: 2 Gy Prescribed Dose (Delivered / Prescribed): 8 Gy / 8 Gy Prescribed Fxs (Delivered / Prescribed): 4 / 4     ==========ON TREATMENT VISIT DATES========== 2023-03-15, 2023-03-19, 2023-03-26, 2023-04-02     ==========UPCOMING VISITS==========       ==========APPENDIX - ON TREATMENT VISIT NOTES==========   See weekly On Treatment Notes in Epic for details.

## 2023-04-12 ENCOUNTER — Encounter: Payer: Self-pay | Admitting: Adult Health

## 2023-04-13 MED ORDER — TOPIRAMATE 50 MG PO TABS: 50.0000 mg | ORAL_TABLET | Freq: Every day | ORAL | 1 refills | Status: DC

## 2023-04-27 DIAGNOSIS — E890 Postprocedural hypothyroidism: Secondary | ICD-10-CM | POA: Diagnosis not present

## 2023-04-27 DIAGNOSIS — I10 Essential (primary) hypertension: Secondary | ICD-10-CM | POA: Diagnosis not present

## 2023-04-27 DIAGNOSIS — E1165 Type 2 diabetes mellitus with hyperglycemia: Secondary | ICD-10-CM | POA: Diagnosis not present

## 2023-05-04 ENCOUNTER — Inpatient Hospital Stay: Payer: Medicare Other | Attending: Hematology

## 2023-05-04 ENCOUNTER — Other Ambulatory Visit: Payer: Self-pay

## 2023-05-04 ENCOUNTER — Inpatient Hospital Stay (HOSPITAL_BASED_OUTPATIENT_CLINIC_OR_DEPARTMENT_OTHER): Payer: Medicare Other | Admitting: Hematology

## 2023-05-04 VITALS — BP 145/62 | HR 89 | Temp 97.9°F | Resp 17 | Ht 63.0 in | Wt 194.5 lb

## 2023-05-04 DIAGNOSIS — Z79810 Long term (current) use of selective estrogen receptor modulators (SERMs): Secondary | ICD-10-CM | POA: Insufficient documentation

## 2023-05-04 DIAGNOSIS — G620 Drug-induced polyneuropathy: Secondary | ICD-10-CM

## 2023-05-04 DIAGNOSIS — Z170 Estrogen receptor positive status [ER+]: Secondary | ICD-10-CM

## 2023-05-04 DIAGNOSIS — E119 Type 2 diabetes mellitus without complications: Secondary | ICD-10-CM | POA: Insufficient documentation

## 2023-05-04 DIAGNOSIS — Z79624 Long term (current) use of inhibitors of nucleotide synthesis: Secondary | ICD-10-CM | POA: Diagnosis not present

## 2023-05-04 DIAGNOSIS — Z79620 Long term (current) use of immunosuppressive biologic: Secondary | ICD-10-CM | POA: Diagnosis not present

## 2023-05-04 DIAGNOSIS — Z79899 Other long term (current) drug therapy: Secondary | ICD-10-CM | POA: Insufficient documentation

## 2023-05-04 DIAGNOSIS — Z7952 Long term (current) use of systemic steroids: Secondary | ICD-10-CM | POA: Insufficient documentation

## 2023-05-04 DIAGNOSIS — G7000 Myasthenia gravis without (acute) exacerbation: Secondary | ICD-10-CM

## 2023-05-04 DIAGNOSIS — T451X5D Adverse effect of antineoplastic and immunosuppressive drugs, subsequent encounter: Secondary | ICD-10-CM | POA: Insufficient documentation

## 2023-05-04 DIAGNOSIS — E7800 Pure hypercholesterolemia, unspecified: Secondary | ICD-10-CM | POA: Diagnosis not present

## 2023-05-04 DIAGNOSIS — Z8673 Personal history of transient ischemic attack (TIA), and cerebral infarction without residual deficits: Secondary | ICD-10-CM | POA: Insufficient documentation

## 2023-05-04 DIAGNOSIS — Z923 Personal history of irradiation: Secondary | ICD-10-CM | POA: Insufficient documentation

## 2023-05-04 DIAGNOSIS — C50412 Malignant neoplasm of upper-outer quadrant of left female breast: Secondary | ICD-10-CM | POA: Insufficient documentation

## 2023-05-04 DIAGNOSIS — Z95828 Presence of other vascular implants and grafts: Secondary | ICD-10-CM

## 2023-05-04 LAB — CMP (CANCER CENTER ONLY)
ALT: 41 U/L (ref 0–44)
AST: 33 U/L (ref 15–41)
Albumin: 3.7 g/dL (ref 3.5–5.0)
Alkaline Phosphatase: 160 U/L — ABNORMAL HIGH (ref 38–126)
Anion gap: 6 (ref 5–15)
BUN: 33 mg/dL — ABNORMAL HIGH (ref 8–23)
CO2: 24 mmol/L (ref 22–32)
Calcium: 9.1 mg/dL (ref 8.9–10.3)
Chloride: 110 mmol/L (ref 98–111)
Creatinine: 1.01 mg/dL — ABNORMAL HIGH (ref 0.44–1.00)
GFR, Estimated: 60 mL/min — ABNORMAL LOW (ref >60)
Glucose, Bld: 147 mg/dL — ABNORMAL HIGH (ref 70–99)
Potassium: 4.2 mmol/L (ref 3.5–5.1)
Sodium: 140 mmol/L (ref 135–145)
Total Bilirubin: 0.3 mg/dL (ref 0.3–1.2)
Total Protein: 7.4 g/dL (ref 6.5–8.1)

## 2023-05-04 LAB — CBC WITH DIFFERENTIAL (CANCER CENTER ONLY)
Abs Immature Granulocytes: 0.01 10*3/uL (ref 0.00–0.07)
Basophils Absolute: 0 10*3/uL (ref 0.0–0.1)
Basophils Relative: 1 %
Eosinophils Absolute: 0.2 10*3/uL (ref 0.0–0.5)
Eosinophils Relative: 4 %
HCT: 31.7 % — ABNORMAL LOW (ref 36.0–46.0)
Hemoglobin: 10.2 g/dL — ABNORMAL LOW (ref 12.0–15.0)
Immature Granulocytes: 0 %
Lymphocytes Relative: 14 %
Lymphs Abs: 0.8 10*3/uL (ref 0.7–4.0)
MCH: 30.7 pg (ref 26.0–34.0)
MCHC: 32.2 g/dL (ref 30.0–36.0)
MCV: 95.5 fL (ref 80.0–100.0)
Monocytes Absolute: 0.5 10*3/uL (ref 0.1–1.0)
Monocytes Relative: 9 %
Neutro Abs: 4.2 10*3/uL (ref 1.7–7.7)
Neutrophils Relative %: 72 %
Platelet Count: 208 10*3/uL (ref 150–400)
RBC: 3.32 MIL/uL — ABNORMAL LOW (ref 3.87–5.11)
RDW: 13.2 % (ref 11.5–15.5)
WBC Count: 5.8 10*3/uL (ref 4.0–10.5)
nRBC: 0 % (ref 0.0–0.2)

## 2023-05-04 MED ORDER — SODIUM CHLORIDE 0.9% FLUSH
10.0000 mL | Freq: Once | INTRAVENOUS | Status: AC
  Administered 2023-05-04: 10 mL

## 2023-05-04 MED ORDER — HEPARIN SOD (PORK) LOCK FLUSH 100 UNIT/ML IV SOLN
500.0000 [IU] | Freq: Once | INTRAVENOUS | Status: AC
  Administered 2023-05-04: 500 [IU]

## 2023-05-04 MED ORDER — TAMOXIFEN CITRATE 20 MG PO TABS: 20.0000 mg | ORAL_TABLET | Freq: Every day | ORAL | 3 refills | Status: DC

## 2023-05-04 NOTE — Assessment & Plan Note (Signed)
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 -she completed adjuvant chemo with weekly abraxane X12 in 01/2023. Due to her neuropathy, chemo dose reduced.  -She completed adjuvant radiation in August 2024 -Her bone density scan in August 2024 showed multiple sites of osteopenia with T-score -2.3, high risk of hip fracture is 3% in the next 10 years. -I discussed the role of adjuvant antiestrogen therapy, given her high risk of hip fracture from osteopenia, I recommended tamoxifen for 5 years, giving weak ER positivity. --The potential side effects, which includes but not limited to, hot flash, skin and vaginal dryness, slightly increased risk of cardiovascular disease and cataract, small risk of thrombosis and endometrial cancer, were discussed with her in great details. Preventive strategies for thrombosis, such as being physically active, using compression stocks, avoid cigarette smoking, etc., were reviewed with her. I also recommend her to follow-up with her gynecologist once a year, and watch for vaginal spotting or bleeding, as a clinically sign of endometrial cancer, etc. She voiced good understanding, and agrees to proceed. Will start after she completes adjuvant breast radiation. -We also reviewed the breast cancer surveillance, including annual mammogram, and physical exam.  -She will start in next few weeks

## 2023-05-04 NOTE — Progress Notes (Signed)
[facility]   [phone] [phone]   Clinic Follow up Note   Patient Care Team: Irena Reichmann, DO as PCP - General (Family Medicine) Micki Riley, MD as Consulting Physician (Neurology) Ihor Austin, NP as Nurse Practitioner (Neurology) Donnelly Angelica, RN as Oncology Nurse Navigator Pershing Proud, RN as Oncology Nurse Navigator Abigail Miyamoto, MD as Consulting Physician (General Surgery) Malachy Mood, MD as Consulting Physician (Hematology) Dorothy Puffer, MD as Consulting Physician (Radiation Oncology) Dianne Dun, MD as Consulting Physician (Rheumatology) Mammography, [facility] as Consulting Physician (Diagnostic Radiology)  Date of Service:  05/04/2023  CHIEF COMPLAINT: f/u of left breast cancer   CURRENT THERAPY:   Tamoxifen 20 mg daily starting    ASSESSMENT:  [name] is a 72 y.o. 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 -she completed adjuvant chemo with weekly abraxane X12 in 01/2023. Due to her neuropathy, chemo dose reduced.  -She completed adjuvant radiation in August 2024 -Her bone density scan in August 2024 showed multiple sites of osteopenia with T-score -2.3, high risk of hip fracture is 3% in the next 10 years. -I discussed the role of adjuvant antiestrogen therapy, given her high risk of hip fracture from osteopenia, I recommended tamoxifen for 5 years, giving weak ER positivity. --The potential side effects, which includes but not limited to, hot flash, skin and vaginal dryness, slightly increased risk of cardiovascular disease and cataract, small risk of thrombosis and endometrial cancer, were discussed with  her in great details. Preventive strategies for thrombosis, such as being physically active, using compression stocks, avoid cigarette smoking, etc., were reviewed with her. I also recommend her to follow-up with her gynecologist once a year, and watch for vaginal spotting or bleeding, as a clinically sign of endometrial cancer, etc. She voiced good understanding, and agrees to proceed. Will start after she completes adjuvant breast radiation. -We also reviewed the breast cancer surveillance, including annual mammogram, and physical exam.  -She will start in next few weeks    Myasthenia gravis (HCC) -She is on Rituxan every 6 months, CellCept and prednisone.  she continued during chemo  -f/u with neurology     Peripheral neuropathy -secondary to chemo, her DM may contribute also -I called in gabapentin, she has not started yet.  She will continue cryotherapy during chemo, and use warm gloves when the blanket at night. -neuropathy is minimum now        PLAN: -lab reviewed -CMP- reviewed - I recommend antiestrogen therapy Tamoxifen -Discuss Zometa infusion to strengthen bone if needed - pt agree to Tamoxifen 20 mg daily - I order diagnostic mammogram 06/2023 -lab/flush and f/u in 3 months NP Lacie -port removal in next 3 months    SUMMARY OF ONCOLOGIC HISTORY: Oncology History Overview Note   Cancer Staging  Malignant neoplasm of upper-outer quadrant of left breast in female, estrogen receptor positive (HCC) Staging form: Breast, AJCC 8th Edition - Clinical: Stage IB (cT1b, cN0, cM0, G3, ER+, PR-, HER2-) - Signed by Ronny Bacon, PA-C on 08/10/2022 Stage prefix: Initial diagnosis Method of lymph node assessment: Clinical Histologic grading system: 3 grade system - Pathologic stage from 09/02/2022: Stage IB (pT1b, pN0, cM0, G2, ER-, PR-, HER2-) -  Signed by Malachy Mood, MD on 09/23/2022 Stage prefix: Initial diagnosis Histologic grading system: 3 grade system Residual  tumor (R): R0 - None     Malignant neoplasm of upper-outer quadrant of left breast in female, estrogen receptor positive (HCC)  08/10/2022 Initial Diagnosis   Malignant neoplasm of upper-outer quadrant of left female breast (HCC)   08/10/2022 Cancer Staging   Staging form: Breast, AJCC 8th Edition - Clinical: Stage IB (cT1b, cN0, cM0, G3, ER+, PR-, HER2-) - Signed by Ronny Bacon, PA-C on 08/10/2022 Stage prefix: Initial diagnosis Method of lymph node assessment: Clinical Histologic grading system: 3 grade system   09/02/2022 Cancer Staging   Staging form: Breast, AJCC 8th Edition - Pathologic stage from 09/02/2022: Stage IB (pT1b, pN0, cM0, G2, ER-, PR-, HER2-) - Signed by Malachy Mood, MD on 09/23/2022 Stage prefix: Initial diagnosis Histologic grading system: 3 grade system Residual tumor (R): R0 - None   10/21/2022 -  Chemotherapy   Patient is on Treatment Plan : BREAST PACLitaxel-Albumin (Abraxane) (100) D1,8,15 q28d        INTERVAL HISTORY:  [name] is here for a follow up of left breast cancer. She was last seen by me on 7/18/204. She presents to the clinic accompanied by sister.Pt report that she has fatigue all the time. She reports of having a place on her left breast. Pt also state that she feels better since completing radiation. Pt denies having numbness and tingling on her finger tips.    All other systems were reviewed with the patient and are negative.  MEDICAL HISTORY:  Past Medical History:  Diagnosis Date   Arthritis    "all over my body" (03/13/2013)   Asthma    Asymptomatic carotid artery stenosis, bilateral 10/08/2018   Breast cancer (HCC)    GERD (gastroesophageal reflux disease)    H/O hiatal hernia    Headache    pt states she has had headaches for about 6 months "off and on"   Heart murmur    pt had echocardiogram on 09/17/21   Hypercholesterolemia 10/08/2018   Hypertension    Hypothyroidism    Myasthenia gravis (HCC)    "in my  eyes; diagnsosed > 7 yr ago" (03/13/2013)   Sleep apnea    on CPAP   Stroke (HCC) 2021   Thyroid carcinoma (HCC)    Type II diabetes mellitus (HCC)     SURGICAL HISTORY: Past Surgical History:  Procedure Laterality Date   ABDOMINAL HYSTERECTOMY     ANTERIOR CERVICAL DECOMP/DISCECTOMY FUSION     "I've had severa ORs; always went in from the front" (03/13/2013)   APPENDECTOMY     BREAST LUMPECTOMY WITH RADIOACTIVE SEED AND SENTINEL LYMPH NODE BIOPSY Left 09/02/2022   Procedure: LEFT BREAST LUMPECTOMY WITH RADIOACTIVE SEED AND SENTINEL LYMPH NODE BIOPSY;  Surgeon: Abigail Miyamoto, MD;  Location: [facility] OR;  Service: General;  Laterality: Left;   CARDIAC CATHETERIZATION     "several" (03/13/2013)   CARPAL TUNNEL RELEASE Right    CATARACT EXTRACTION W/ INTRAOCULAR LENS  IMPLANT, BILATERAL Bilateral    CHOLECYSTECTOMY     KNEE ARTHROSCOPY Left    SHOULDER ARTHROSCOPY W/ ROTATOR CUFF REPAIR Left    TONSILLECTOMY     TOTAL THYROIDECTOMY     TRANSCAROTID ARTERY REVASCULARIZATION  Left 09/24/2021   Procedure: LEFT TRANSCAROTID ARTERY REVASCULARIZATION;  Surgeon: Leonie Douglas, MD;  Location: [facility] OR;  Service: Vascular;  Laterality: Left;   TRANSCAROTID ARTERY REVASCULARIZATION  Right 10/23/2021   Procedure:  Right Transcarotid Artery Revascularization;  Surgeon: Leonie Douglas, MD;  Location: [facility] OR;  Service: Vascular;  Laterality: Right;   ULTRASOUND GUIDANCE FOR VASCULAR ACCESS Right 09/24/2021   Procedure: ULTRASOUND GUIDANCE FOR VASCULAR ACCESS;  Surgeon: Leonie Douglas, MD;  Location: [facility] OR;  Service: Vascular;  Laterality: Right;   ULTRASOUND GUIDANCE FOR VASCULAR ACCESS Right 10/23/2021   Procedure: ULTRASOUND GUIDANCE FOR VASCULAR ACCESS, LEFT FEMORAL VEIN;  Surgeon: Leonie Douglas, MD;  Location: [facility] OR;  Service: Vascular;  Laterality: Right;    I have reviewed the social history and family history with the patient and they are unchanged from previous note.  ALLERGIES:  is allergic to  contrast media [iodinated contrast media], fluorescein, iodine, molds & smuts, shellfish-derived products, pholcodine, azithromycin, codeine, metformin hcl er, oxycodone, tramadol hcl, and other.  MEDICATIONS:  Current Outpatient Medications  Medication Sig Dispense Refill   tamoxifen (NOLVADEX) 20 MG tablet Take 1 tablet (20 mg total) by mouth daily. 30 tablet 3   acetaminophen (TYLENOL) 500 MG tablet Take 500 mg by mouth every 6 (six) hours as needed for moderate pain.     aspirin EC 81 MG EC tablet Take 1 tablet (81 mg total) by mouth daily.     Biotin 5000 MCG CAPS Take 5,000 mcg by mouth daily with breakfast.      cycloSPORINE (RESTASIS) 0.05 % ophthalmic emulsion Place 1 drop into both eyes 2 (two) times daily. 0.4 mL 0   diclofenac Sodium (VOLTAREN) 1 % GEL Apply 2 g topically 4 (four) times daily. (Patient taking differently: Apply 1 application  topically daily as needed (pain).) 2 g 0   diphenhydrAMINE 25 mg in sodium chloride 0.9 % 50 mL Inject 25 mg into the vein See admin instructions. Administer 25 mg intravenously at start of Gamunex infusion, then administer 25 mg during infusion     diphenoxylate-atropine (LOMOTIL) 2.5-0.025 MG tablet Take 1-2 tablets by mouth 4 (four) times daily as needed for diarrhea or loose stools. 30 tablet 2   ELDERBERRY PO Take 1 tablet by mouth daily.     empagliflozin (JARDIANCE) 25 MG TABS tablet Take 25 mg by mouth daily.     esomeprazole (NEXIUM) 40 MG capsule Take 1 capsule (40 mg total) by mouth 2 (two) times daily. 60 capsule 0   Evolocumab with Infusor (REPATHA PUSHTRONEX SYSTEM) 420 MG/3.5ML SOCT Inject 420 mg into the skin every 30 (thirty) days.     Exenatide ER (BYDUREON BCISE) 2 MG/0.85ML AUIJ Inject 2 mg into the skin every Monday.     ezetimibe (ZETIA) 10 MG tablet TAKE 1 TABLET BY MOUTH EVERY DAY 90 tablet 1   furosemide (LASIX) 40 MG tablet Take 40 mg by mouth daily as needed for edema.     gabapentin (NEURONTIN) 100 MG capsule Take 1  capsule (100 mg total) by mouth at bedtime. Increase to 2 cap at night in 2 weeks if needed 30 capsule 0   Immune Globulin, Human, 40 GM/400ML SOLN Inject into the vein every 6 (six) weeks. Infusions are administered at home by home health nurse - last infusion 09/08/21 and 09/09/21; next infusion due 10/19/21 and 10/20/21     insulin degludec (TRESIBA FLEXTOUCH) 100 UNIT/ML FlexTouch Pen Inject 15 Units into the skin in the morning.     Insulin Disposable Pump (OMNIPOD DASH 5 PACK PODS) MISC Use with Humalog     insulin lispro (HUMALOG) 100 UNIT/ML injection Inject into the skin See admin instructions. Inject subcutaneously 3 (  three) times daily with meals With meals through the Omnipod Dash Pump every 72 hours as directed     levothyroxine (SYNTHROID) 175 MCG tablet Take 175 mcg by mouth daily before breakfast.     lidocaine-prilocaine (EMLA) cream Apply to affected area once 30 g 3   loperamide (IMODIUM A-D) 2 MG tablet Take 2 mg by mouth 4 (four) times daily as needed for diarrhea or loose stools.     loratadine (CLARITIN) 10 MG tablet Take 1 tablet (10 mg total) by mouth at bedtime. 30 tablet 0   meclizine (ANTIVERT) 25 MG tablet Take 25 mg by mouth 3 (three) times daily as needed for dizziness or nausea (or migraine-related nausea).     montelukast (SINGULAIR) 10 MG tablet Take 1 tablet (10 mg total) by mouth every morning. 30 tablet 0   mycophenolate (CELLCEPT) 500 MG tablet Take 1,000 mg by mouth 2 (two) times daily. For myasthenia gravis     olmesartan (BENICAR) 20 MG tablet Take 1 tablet (20 mg total) by mouth daily. 30 tablet 0   ondansetron (ZOFRAN) 8 MG tablet Take 1 tablet (8 mg total) by mouth every 8 (eight) hours as needed for nausea or vomiting. 30 tablet 1   predniSONE (DELTASONE) 2.5 MG tablet Take 7.5 mg by mouth every morning. For myasthenia gravis     PRESCRIPTION MEDICATION Pt uses a cpap nightly     PROAIR HFA 108 (90 BASE) MCG/ACT inhaler Inhale 2 puffs into the lungs every 6  (six) hours as needed for wheezing or shortness of breath.      prochlorperazine (COMPAZINE) 10 MG tablet Take 1 tablet (10 mg total) by mouth every 6 (six) hours as needed for nausea or vomiting. 30 tablet 1   riTUXimab (RITUXAN) 500 MG/50ML injection Inject into the vein every 6 (six) months.     rosuvastatin (CRESTOR) 40 MG tablet Take 1 tablet (40 mg total) by mouth daily. 90 tablet 3   topiramate (TOPAMAX) 50 MG tablet Take 1 tablet (50 mg total) by mouth at bedtime. 90 tablet 1   traMADol (ULTRAM) 50 MG tablet Take 1 tablet (50 mg total) by mouth every 6 (six) hours as needed for moderate pain. 25 tablet 0   vitamin B-12 (CYANOCOBALAMIN) 1000 MCG tablet Take 1,000 mcg by mouth daily.     Vitamin D, Ergocalciferol, (DRISDOL) 50000 UNITS CAPS capsule Take 50,000 Units by mouth every Monday.      No current facility-administered medications for this visit.    PHYSICAL EXAMINATION: ECOG PERFORMANCE STATUS: 2 - Symptomatic, <50% confined to bed  Vitals:   05/04/23 0950  BP: (!) 145/62  Pulse: 89  Resp: 17  Temp: 97.9 F (36.6 C)  SpO2: 99%   Wt Readings from Last 3 Encounters:  05/04/23 194 lb 8 oz (88.2 kg)  03/11/23 190 lb 11.2 oz (86.5 kg)  02/23/23 185 lb (83.9 kg)     GENERAL:alert, no distress and comfortable SKIN: skin color normal, no rashes or significant lesions EYES: normal, Conjunctiva are pink and non-injected, sclera clear  NEURO: alert & oriented x 3 with fluent speech NECK: (-)supple, thyroid normal size, non-tender, without nodularity LYMPH:(-)  no palpable lymphadenopathy in the cervical, axillary  ABDOMEN:(-)abdomen soft, non-tender and normal bowel sounds BREAST: RT breast no palpable mass breast exam benign. LT breast dark in color. Skin healing in breast fold. No palpable mass breast exam benign.  LABORATORY DATA:  I have reviewed the data as listed    Latest Ref  Rng & Units 05/04/2023    9:15 AM 03/11/2023   12:55 PM 01/27/2023    8:01 AM  CBC  WBC  4.0 - 10.5 K/uL 5.8  8.6  3.6   Hemoglobin 12.0 - 15.0 g/dL 55.7  9.3  8.6   Hematocrit 36.0 - 46.0 % 31.7  28.5  26.6   Platelets 150 - 400 K/uL 208  268  280         Latest Ref Rng & Units 05/04/2023    9:15 AM 03/11/2023   12:55 PM 01/27/2023    8:01 AM  CMP  Glucose 70 - 99 mg/dL 322  025  427   BUN 8 - 23 mg/dL 33  29  22   Creatinine 0.44 - 1.00 mg/dL 0.62  3.76  2.83   Sodium 135 - 145 mmol/L 140  141  140   Potassium 3.5 - 5.1 mmol/L 4.2  4.3  3.6   Chloride 98 - 111 mmol/L 110  111  110   CO2 22 - 32 mmol/L 24  22  21    Calcium 8.9 - 10.3 mg/dL 9.1  9.2  9.1   Total Protein 6.5 - 8.1 g/dL 7.4  6.9  8.3   Total Bilirubin 0.3 - 1.2 mg/dL 0.3  0.4  0.3   Alkaline Phos 38 - 126 U/L 160  165  151   AST 15 - 41 U/L 33  25  32   ALT 0 - 44 U/L 41  27  27       RADIOGRAPHIC STUDIES: I have personally reviewed the radiological images as listed and agreed with the findings in the report. No results found.    Orders Placed This Encounter  Procedures   MM 3D DIAGNOSTIC MAMMOGRAM BILATERAL BREAST    Standing Status:   Future    Standing Expiration Date:   05/03/2024    Scheduling Instructions:     [facility]    Order Specific Question:   Reason for Exam (SYMPTOM  OR DIAGNOSIS REQUIRED)    Answer:   screening    Order Specific Question:   Preferred imaging location?    Answer:   External   All questions were answered. The patient knows to call the clinic with any problems, questions or concerns. No barriers to learning was detected. The total time spent in the appointment was 25 minutes.     Malachy Mood, MD 05/04/2023   Carolin Coy, CMA, am acting as scribe for Malachy Mood, MD.   I have reviewed the above documentation for accuracy and completeness, and I agree with the above.

## 2023-05-04 NOTE — Assessment & Plan Note (Signed)
-  secondary to chemo, her DM may contribute also -I called in gabapentin, she has not started yet.  She will continue cryotherapy during chemo, and use warm gloves when the blanket at night. -neuropathy is minimum now    

## 2023-05-04 NOTE — Assessment & Plan Note (Addendum)
-  She is on Rituxan every 6 months, CellCept and prednisone.  she continued during chemo  -f/u with neurology

## 2023-05-05 ENCOUNTER — Other Ambulatory Visit: Payer: Self-pay

## 2023-05-06 DIAGNOSIS — E559 Vitamin D deficiency, unspecified: Secondary | ICD-10-CM | POA: Diagnosis not present

## 2023-05-06 DIAGNOSIS — E1165 Type 2 diabetes mellitus with hyperglycemia: Secondary | ICD-10-CM | POA: Diagnosis not present

## 2023-05-06 DIAGNOSIS — I10 Essential (primary) hypertension: Secondary | ICD-10-CM | POA: Diagnosis not present

## 2023-05-06 DIAGNOSIS — Z9641 Presence of insulin pump (external) (internal): Secondary | ICD-10-CM | POA: Diagnosis not present

## 2023-05-06 DIAGNOSIS — C73 Malignant neoplasm of thyroid gland: Secondary | ICD-10-CM | POA: Diagnosis not present

## 2023-05-06 DIAGNOSIS — M8580 Other specified disorders of bone density and structure, unspecified site: Secondary | ICD-10-CM | POA: Diagnosis not present

## 2023-05-06 DIAGNOSIS — G7000 Myasthenia gravis without (acute) exacerbation: Secondary | ICD-10-CM | POA: Diagnosis not present

## 2023-05-06 DIAGNOSIS — Z780 Asymptomatic menopausal state: Secondary | ICD-10-CM | POA: Diagnosis not present

## 2023-05-06 DIAGNOSIS — E890 Postprocedural hypothyroidism: Secondary | ICD-10-CM | POA: Diagnosis not present

## 2023-05-06 DIAGNOSIS — E118 Type 2 diabetes mellitus with unspecified complications: Secondary | ICD-10-CM | POA: Diagnosis not present

## 2023-05-06 DIAGNOSIS — Z23 Encounter for immunization: Secondary | ICD-10-CM | POA: Diagnosis not present

## 2023-05-06 DIAGNOSIS — E7800 Pure hypercholesterolemia, unspecified: Secondary | ICD-10-CM | POA: Diagnosis not present

## 2023-05-18 ENCOUNTER — Other Ambulatory Visit: Payer: Self-pay

## 2023-05-24 ENCOUNTER — Other Ambulatory Visit: Payer: Self-pay | Admitting: Hematology

## 2023-05-25 ENCOUNTER — Encounter: Payer: Self-pay | Admitting: Hematology

## 2023-05-28 DIAGNOSIS — Z5181 Encounter for therapeutic drug level monitoring: Secondary | ICD-10-CM | POA: Diagnosis not present

## 2023-05-28 DIAGNOSIS — G7000 Myasthenia gravis without (acute) exacerbation: Secondary | ICD-10-CM | POA: Diagnosis not present

## 2023-06-03 DIAGNOSIS — H4312 Vitreous hemorrhage, left eye: Secondary | ICD-10-CM | POA: Diagnosis not present

## 2023-06-03 DIAGNOSIS — Z7984 Long term (current) use of oral hypoglycemic drugs: Secondary | ICD-10-CM | POA: Diagnosis not present

## 2023-06-03 DIAGNOSIS — E113312 Type 2 diabetes mellitus with moderate nonproliferative diabetic retinopathy with macular edema, left eye: Secondary | ICD-10-CM | POA: Diagnosis not present

## 2023-06-03 DIAGNOSIS — H3500 Unspecified background retinopathy: Secondary | ICD-10-CM | POA: Diagnosis not present

## 2023-06-03 DIAGNOSIS — Z8669 Personal history of other diseases of the nervous system and sense organs: Secondary | ICD-10-CM | POA: Diagnosis not present

## 2023-06-03 DIAGNOSIS — H469 Unspecified optic neuritis: Secondary | ICD-10-CM | POA: Diagnosis not present

## 2023-06-03 DIAGNOSIS — G7000 Myasthenia gravis without (acute) exacerbation: Secondary | ICD-10-CM | POA: Diagnosis not present

## 2023-06-07 ENCOUNTER — Telehealth: Payer: Self-pay

## 2023-06-07 ENCOUNTER — Ambulatory Visit
Admission: RE | Admit: 2023-06-07 | Discharge: 2023-06-07 | Disposition: A | Discharge status: Home / Self Care | Payer: Medicare Other | Source: Ambulatory Visit | Attending: Radiation Oncology | Admitting: Radiation Oncology

## 2023-06-07 ENCOUNTER — Other Ambulatory Visit: Payer: Self-pay

## 2023-06-07 MED ORDER — TAMOXIFEN CITRATE 20 MG PO TABS: 20.0000 mg | ORAL_TABLET | Freq: Every day | ORAL | 1 refills | Status: DC

## 2023-06-07 NOTE — Telephone Encounter (Signed)
Received Secure Chat stating pt would like a call regarding Tamoxifen.  Called pt regarding her Tamoxifen.  Pt requested a 90-day supply of Tamoxifen d/t pt does not have transportation.  Pt stated it's easier to have someone go to the pharmacy once every 3 months instead of every month.  Notified Dr. Mosetta Putt and verbal order given "OK to give 90day supply of Tamoxifen".  Refill prescription sent to [facility] per pt's request.

## 2023-06-07 NOTE — Progress Notes (Signed)
Radiation Oncology         [phone] ________________________________  Name: [name] MRN: [id number]  Date of Service: 06/07/2023  DOB: 1950-11-02  Post Treatment Telephone Note  Diagnosis:  C50.412 Malignant neoplasm of upper-outer quadrant of left female breast. (as documented in provider EOT note)   The patient was available for call today.   Symptoms of fatigue have not improved since completing therapy.  Patient currently complains of LT breast pain 6/10, but is doing okay.  Symptoms of skin changes have improved since completing therapy.  The patient was encouraged to avoid sun exposure in the area of prior treatment for up to one year following radiation with either sunscreen or by the style of clothing worn in the sun.  The patient has scheduled follow up with her medical oncologist Dr. Mosetta Putt for ongoing surveillance, and was encouraged to call if she develops concerns or questions regarding radiation.  This concludes the interaction.  Ruel Favors, LPN

## 2023-06-14 DIAGNOSIS — Z9889 Other specified postprocedural states: Secondary | ICD-10-CM | POA: Diagnosis not present

## 2023-06-14 DIAGNOSIS — R0600 Dyspnea, unspecified: Secondary | ICD-10-CM | POA: Diagnosis not present

## 2023-06-17 DIAGNOSIS — I10 Essential (primary) hypertension: Secondary | ICD-10-CM | POA: Diagnosis not present

## 2023-06-17 DIAGNOSIS — E890 Postprocedural hypothyroidism: Secondary | ICD-10-CM | POA: Diagnosis not present

## 2023-07-15 DIAGNOSIS — Z170 Estrogen receptor positive status [ER+]: Secondary | ICD-10-CM | POA: Diagnosis not present

## 2023-07-15 DIAGNOSIS — Z853 Personal history of malignant neoplasm of breast: Secondary | ICD-10-CM | POA: Diagnosis not present

## 2023-07-15 DIAGNOSIS — N6489 Other specified disorders of breast: Secondary | ICD-10-CM | POA: Diagnosis not present

## 2023-07-20 ENCOUNTER — Encounter: Payer: Self-pay | Admitting: Hematology

## 2023-07-26 ENCOUNTER — Encounter: Payer: Self-pay | Admitting: *Deleted

## 2023-07-27 ENCOUNTER — Encounter: Payer: Medicare Other | Admitting: Nurse Practitioner

## 2023-07-27 ENCOUNTER — Encounter: Payer: Self-pay | Admitting: Adult Health

## 2023-07-27 ENCOUNTER — Ambulatory Visit (INDEPENDENT_AMBULATORY_CARE_PROVIDER_SITE_OTHER): Payer: Medicare Other | Admitting: Adult Health

## 2023-07-27 VITALS — BP 188/79 | HR 97 | Ht 63.0 in | Wt 204.2 lb

## 2023-07-27 DIAGNOSIS — G43009 Migraine without aura, not intractable, without status migrainosus: Secondary | ICD-10-CM | POA: Diagnosis not present

## 2023-07-27 DIAGNOSIS — G4733 Obstructive sleep apnea (adult) (pediatric): Secondary | ICD-10-CM | POA: Diagnosis not present

## 2023-07-27 MED ORDER — TOPIRAMATE 50 MG PO TABS
50.0000 mg | ORAL_TABLET | Freq: Every day | ORAL | 1 refills | Status: DC
Start: 2023-07-27 — End: 2023-10-04

## 2023-07-27 NOTE — Progress Notes (Signed)
[facility] [address] [place]. [place] [address] [phone]       OFFICE FOLLOW UP NOTE  [name] Date of Birth:  07-24-1951 Medical Record Number:  [id number]   Reason for visit: CPAP and headache follow-up    SUBJECTIVE:   CHIEF COMPLAINT:  Chief Complaint  Patient presents with   Follow-up    Patient in room #3 with sister in lobby. Patient states her mask doesn't fit right on her face. Patient states her migraines has been off and on, she stressed about her cancer results.    HPI:   Update 07/27/2023: Returns for yearly follow-up.  She has not been able to use CPAP much in the past year as she was diagnosed with breast cancer in 07/2022, underwent lumpectomy in 08/2022, completed 12 rounds of chemo in June and radiation in August.  She had difficulty tolerating her CPAP due to nausea, she attempted to restart in beginning of September but FFM mask was not fitting correctly, causing irritation on bridge of nose and having constant leaks. She stopped using mid October.  She received new CPAP mask last week but has not yet restarted.  She is wanting to get restarted back on CPAP as she does notice a difference in sleep quality and energy levels without use.  She has also noted some increased headaches while receiving chemo and radiation, they have been gradually improving.  She remains on topiramate 50 mg nightly without side effects.  Reviewed CPAP compliance from 9/2 - 10/1 which showed 21 out of 30 usage days with only 19 days greater than 4 hours.  Average usage 6 hours and 10 minutes.  Residual AHI 0.8.  Pressure in the 95th percentile 10.5 on pressure setting of 6-16 with EPR level 3.  Leaks in the 95th percentile 7.0, maximum 57.6.        History provided for reference purposes only Update 08/19/2022 JM: Patient returns for initial CPAP compliance visit and headache follow-up.  Evaluated by Dr. Vickey Huger on 8/17 and underwent sleep study  on 9/19 which showed mild OSA with total AHI of 9.2/h and recommend restart of AutoPap.  Reports improvement of sleep since restarting CPAP. Issues with mask fitting the past couple of days causing some leaks around the side.  She is otherwise tolerating CPAP well.  Epworth Sleepiness Scale 3/24.  Compliance report showed 100% compliance with residual AHI 0.7/h.  Pressure in the 95 percentile 10.9 on pressure setting of 6-16 with EPR 3.  Leaks in the 95th percentile 2.4.  Headaches well-controlled on topiramate 50 mg nightly, currently experiencing 1-2 headache days per month. Will take tylenol for rescue with benefit.   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   Update 02/11/2022 JM: Patient returns for 74-month follow-up visit.  Overall stable.  Currently, experiencing 3-4 headaches per month. Tolerating lower dose of topiramate 50 mg nightly. Has been having greater difficulty with vision with persistent diplopia and floaters.  Received IVIG 6/16 and is closely being followed by [facility] neurology and multiple ophthalmologist. Also reports previously use CPAP for OSA, previously followed by Dr. Vickey Huger, has not been able to use over the past 6 months as it has not been working correctly (received machine in 2015). She would like a new machine to get restarted as she did feel better with use.   Continues to follow with vascular surgery and underwent left transcarotid revascularization 2/1 and R TCAR 3/2 without complication.  Also routinely followed  by PCP and cardiology.   No further concerns at this time  Update 08/06/2021 Dr. Pearlean Brownie: Patient returns for follow-up after last visit 6 months ago.  She is accompanied by her daughter.  Patient states her headaches are doing well and still occur intermittently off and on once every 2 to 3 weeks.  She is on Topamax 50 mg twice daily but feels that the morning dose of makes her sleepy and wonders if she needs to reduce the dose.  She is not  had much episodes of numbness and occasionally has numbness involving her nose and right face which is not bothersome.  She does follow-up with her neurologist at [facility] for myasthenia and getting treated with IVIG infusion symptoms appear to be stable.  She has had no recurrent stroke or TIA symptoms.  She did have follow-up carotid ultrasound done last week in Dr. Verl Dicker office which shows stable appearance of greater than 70% right ICA and 50 to 69% left ICA stenosis.  She has no new symptoms.  She wants to drive.   Update 02/18/2021 Dr. Pearlean Brownie: She returns for follow-up after last visit 3 months ago.  Patient states she has noticed some improvement in her headaches and numbness when she takes Topamax but she is not taking it on a scheduled basis and takes it only as needed.  Symptoms still persist but not as bothersome.  She has a new complaint of pain and swelling in the left eye and she has seen an ophthalmologist with diagnosed her with a stye.  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  LDL cholesterol on 01/01/2021 was 133 and Dr. Jacinto Halim increase the dose of Crestor which she now takes at 40 mg daily.  She also had carotid ultrasound on 01/07/2021 which showed 70% right ICA and 50 to 69% left ICA stenosis.  She also is complaining of right hand pain and is wearing a carpal tunnel splint.  She has been advised surgery but she is not happy with the surgeon and is thinking about a second opinion.   Initial visit 10/29/2020 Dr. Myrtie Neither. [name] is a 72 year old African-American lady seen today for office consultation visit.  History is obtained from the patient, review of electronic medical records as well as care everywhere and I personally reviewed pertinent imaging films in PACS.  She has past medical history of hypertension, hyperlipidemia, obesity, asthma, hypothyroidism, seronegative ocular myasthenia, sleep apnea,  thyroid carcinoma, diabetes.  Patient states that she has had new onset of numbness involving the right face for the last few months.  This is intermittent and can last for several minutes to hours and can occur at variable frequency couple of times a week to once every couple of weeks.  She is also noticed for the last several weeks headaches which is sharp shooting severe lasting few seconds involving mostly the frontal and occasionally occipital regions.  These also occur to 3 times a day.  She is been taking some Tylenol which seems to help.  There is occasional sweating with these headaches but she denies any tearing of her eyes or redness accompanying the headaches.  Patient has tried taking gabapentin for the numbness which has not helped.  She is presently on 300 mg 3 times daily which was recently increased.  She has actually history of left thalamic lacunar infarct in April 2021 at that time she did have some right face  paresthesias and numbness which actually resolved several months after till they recurred again recently.  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  She has been following up with Dr. Bess Harvest neurologist at [facility] and is presently on CellCept 1500 mg twice daily and prednisone 10 mg daily.  She still has some mild ptosis and intermittent diplopia but symptoms have been stable for quite some time.  She does have an appointment to see Dr. Bess Harvest in June.  She denies any recurrent stroke or TIA symptoms.  She is on aspirin which is tolerating well without bruising or bleeding.  She states her sugars are now better controlled and she is current only on insulin pump and uses her insulin sensor and has an upcoming appointment at the end of the month with endocrinologist Dr. Romero Belling and will have lipid profile and A1c checked at that visit.  She states her blood pressure is usually runs pretty good though today it is elevated at 170/76 as she  was late for the appointment and she could not find her office.  She denies any loss of vision with her recent new headaches or muscle aches or pains or jaw claudication or scalp tenderness.     ROS:   14 system review of systems performed and negative with exception of those listed in HPI  PMH:  Past Medical History:  Diagnosis Date   Arthritis    "all over my body" (03/13/2013)   Asthma    Asymptomatic carotid artery stenosis, bilateral 10/08/2018   Breast cancer (HCC)    GERD (gastroesophageal reflux disease)    H/O hiatal hernia    Headache    pt states she has had headaches for about 6 months "off and on"   Heart murmur    pt had echocardiogram on 09/17/21   Hypercholesterolemia 10/08/2018   Hypertension    Hypothyroidism    Myasthenia gravis (HCC)    "in my eyes; diagnsosed > 7 yr ago" (03/13/2013)   Sleep apnea    on CPAP   Stroke (HCC) 2021   Thyroid carcinoma (HCC)    Type II diabetes mellitus (HCC)     PSH:  Past Surgical History:  Procedure Laterality Date   ABDOMINAL HYSTERECTOMY     ANTERIOR CERVICAL DECOMP/DISCECTOMY FUSION     "I've had severa ORs; always went in from the front" (03/13/2013)   APPENDECTOMY     BREAST LUMPECTOMY WITH RADIOACTIVE SEED AND SENTINEL LYMPH NODE BIOPSY Left 09/02/2022   Procedure: LEFT BREAST LUMPECTOMY WITH RADIOACTIVE SEED AND SENTINEL LYMPH NODE BIOPSY;  Surgeon: Abigail Miyamoto, MD;  Location: [facility] OR;  Service: General;  Laterality: Left;   CARDIAC CATHETERIZATION     "several" (03/13/2013)   CARPAL TUNNEL RELEASE Right    CATARACT EXTRACTION W/ INTRAOCULAR LENS  IMPLANT, BILATERAL Bilateral    CHOLECYSTECTOMY     KNEE ARTHROSCOPY Left    SHOULDER ARTHROSCOPY W/ ROTATOR CUFF REPAIR Left    TONSILLECTOMY     TOTAL THYROIDECTOMY     TRANSCAROTID ARTERY REVASCULARIZATION  Left 09/24/2021   Procedure: LEFT TRANSCAROTID ARTERY REVASCULARIZATION;  Surgeon: Leonie Douglas, MD;  Location: [facility] OR;  Service: Vascular;  Laterality:  Left;   TRANSCAROTID ARTERY REVASCULARIZATION  Right 10/23/2021   Procedure: Right Transcarotid Artery Revascularization;  Surgeon: Leonie Douglas, MD;  [facility];  Service: Vascular;  Laterality: Right;   ULTRASOUND GUIDANCE FOR VASCULAR ACCESS Right 09/24/2021   Procedure: ULTRASOUND GUIDANCE FOR  VASCULAR ACCESS;  Surgeon: Leonie Douglas, MD;  Location[facility] OR;  Service: Vascular;  Laterality: Right;   ULTRASOUND GUIDANCE FOR VASCULAR ACCESS Right 10/23/2021   Procedure: ULTRASOUND GUIDANCE FOR VASCULAR ACCESS, LEFT FEMORAL VEIN;  Surgeon: Leonie Douglas, MD;  Location: [facility] OR;  Service: Vascular;  Laterality: Right;    Social History:  Social History   Socioeconomic History   Marital status: Single    Spouse name: Not on file   Number of children: 0   Years of education: college   Highest education level: Not on file  Occupational History   Occupation: retired  Tobacco Use   Smoking status: Never   Smokeless tobacco: Never  Vaping Use   Vaping status: Never Used  Substance and Sexual Activity   Alcohol use: No    Alcohol/week: 0.0 standard drinks of alcohol   Drug use: No   Sexual activity: Never  Other Topics Concern   Not on file  Social History Narrative   Lives alone   Right handed   Drinks no caffeine   Social Determinants of Health   Financial Resource Strain: Low Risk  (08/12/2022)   Overall Financial Resource Strain (CARDIA)    Difficulty of Paying Living Expenses: Not very hard  Food Insecurity: No Food Insecurity (10/01/2022)   Hunger Vital Sign    Worried About Running Out of Food in the Last Year: Never true    Ran Out of Food in the Last Year: Never true  Transportation Needs: No Transportation Needs (10/01/2022)   PRAPARE - Administrator, Civil Service (Medical): No    Lack of Transportation (Non-Medical): No  Physical Activity: Not on file  Stress: Not on file  Social Connections: Not on file  Intimate Partner Violence: Not At Risk  (10/01/2022)   Humiliation, Afraid, Rape, and Kick questionnaire    Fear of Current or Ex-Partner: No    Emotionally Abused: No    Physically Abused: No    Sexually Abused: No    Family History:  Family History  Problem Relation Age of Onset   Coronary artery disease Sister        s/p coronary stenting   Stroke Sister 3   Diabetes Sister    Congestive Heart Failure Mother    Asthma Mother    Diabetes Mother    Congestive Heart Failure Father    Lung cancer Father    Asthma Father    Diabetes Father    Diabetes Brother     Medications:   Current Outpatient Medications on File Prior to Visit  Medication Sig Dispense Refill   acetaminophen (TYLENOL) 500 MG tablet Take 500 mg by mouth every 6 (six) hours as needed for moderate pain.     aspirin EC 81 MG EC tablet Take 1 tablet (81 mg total) by mouth daily.     Biotin 5000 MCG CAPS Take 5,000 mcg by mouth daily with breakfast.      cycloSPORINE (RESTASIS) 0.05 % ophthalmic emulsion Place 1 drop into both eyes 2 (two) times daily. 0.4 mL 0   diclofenac Sodium (VOLTAREN) 1 % GEL Apply 2 g topically 4 (four) times daily. (Patient taking differently: Apply 1 application  topically daily as needed (pain).) 2 g 0   diphenhydrAMINE 25 mg in sodium chloride 0.9 % 50 mL Inject 25 mg into the vein See admin instructions. Administer 25 mg intravenously at start of Gamunex infusion, then administer 25 mg during infusion  diphenoxylate-atropine (LOMOTIL) 2.5-0.025 MG tablet Take 1-2 tablets by mouth 4 (four) times daily as needed for diarrhea or loose stools. 30 tablet 2   ELDERBERRY PO Take 1 tablet by mouth daily.     empagliflozin (JARDIANCE) 25 MG TABS tablet Take 25 mg by mouth daily.     esomeprazole (NEXIUM) 40 MG capsule Take 1 capsule (40 mg total) by mouth 2 (two) times daily. 60 capsule 0   Evolocumab with Infusor (REPATHA PUSHTRONEX SYSTEM) 420 MG/3.5ML SOCT Inject 420 mg into the skin every 30 (thirty) days.     Exenatide ER  (BYDUREON BCISE) 2 MG/0.85ML AUIJ Inject 2 mg into the skin every Monday.     ezetimibe (ZETIA) 10 MG tablet TAKE 1 TABLET BY MOUTH EVERY DAY 90 tablet 1   furosemide (LASIX) 40 MG tablet Take 40 mg by mouth daily as needed for edema.     gabapentin (NEURONTIN) 100 MG capsule Take 1-2 capsules (100-200 mg total) by mouth at bedtime. 60 capsule 3   Immune Globulin, Human, 40 GM/400ML SOLN Inject into the vein every 6 (six) weeks. Infusions are administered at home by home health nurse - last infusion 09/08/21 and 09/09/21; next infusion due 10/19/21 and 10/20/21     insulin degludec (TRESIBA FLEXTOUCH) 100 UNIT/ML FlexTouch Pen Inject 15 Units into the skin in the morning.     Insulin Disposable Pump (OMNIPOD DASH 5 PACK PODS) MISC Use with Humalog     insulin lispro (HUMALOG) 100 UNIT/ML injection Inject into the skin See admin instructions. Inject subcutaneously 3 (three) times daily with meals With meals through the Omnipod Dash Pump every 72 hours as directed     Levothyroxine Sodium 200 MCG CAPS Take 200 mcg by mouth daily before breakfast.     lidocaine-prilocaine (EMLA) cream Apply to affected area once 30 g 3   loperamide (IMODIUM A-D) 2 MG tablet Take 2 mg by mouth 4 (four) times daily as needed for diarrhea or loose stools.     loratadine (CLARITIN) 10 MG tablet Take 1 tablet (10 mg total) by mouth at bedtime. 30 tablet 0   meclizine (ANTIVERT) 25 MG tablet Take 25 mg by mouth 3 (three) times daily as needed for dizziness or nausea (or migraine-related nausea).     montelukast (SINGULAIR) 10 MG tablet Take 1 tablet (10 mg total) by mouth every morning. 30 tablet 0   mycophenolate (CELLCEPT) 500 MG tablet Take 1,000 mg by mouth 2 (two) times daily. For myasthenia gravis     olmesartan (BENICAR) 20 MG tablet Take 1 tablet (20 mg total) by mouth daily. 30 tablet 0   ondansetron (ZOFRAN) 8 MG tablet Take 1 tablet (8 mg total) by mouth every 8 (eight) hours as needed for nausea or vomiting. 30 tablet  1   predniSONE (DELTASONE) 2.5 MG tablet Take 7.5 mg by mouth every morning. For myasthenia gravis     PRESCRIPTION MEDICATION Pt uses a cpap nightly     PROAIR HFA 108 (90 BASE) MCG/ACT inhaler Inhale 2 puffs into the lungs every 6 (six) hours as needed for wheezing or shortness of breath.      prochlorperazine (COMPAZINE) 10 MG tablet Take 1 tablet (10 mg total) by mouth every 6 (six) hours as needed for nausea or vomiting. 30 tablet 1   riTUXimab (RITUXAN) 500 MG/50ML injection Inject into the vein every 6 (six) months.     tamoxifen (NOLVADEX) 20 MG tablet Take 1 tablet (20 mg total) by mouth daily. 90  tablet 1   topiramate (TOPAMAX) 50 MG tablet Take 1 tablet (50 mg total) by mouth at bedtime. 90 tablet 1   traMADol (ULTRAM) 50 MG tablet Take 1 tablet (50 mg total) by mouth every 6 (six) hours as needed for moderate pain. 25 tablet 0   vitamin B-12 (CYANOCOBALAMIN) 1000 MCG tablet Take 1,000 mcg by mouth daily.     Vitamin D, Ergocalciferol, (DRISDOL) 50000 UNITS CAPS capsule Take 50,000 Units by mouth every Monday.      rosuvastatin (CRESTOR) 40 MG tablet Take 1 tablet (40 mg total) by mouth daily. 90 tablet 3   No current facility-administered medications on file prior to visit.    Allergies:   Allergies  Allergen Reactions   Contrast Media [Iodinated Contrast Media] Anaphylaxis    01/10/15 and 09/18/2021 --PT GIVEN 13 HR PRE MEDS FOR CT with contrast TOLERATED IV CONTRAST W/O ANY REACTION   Fluorescein Shortness Of Breath and Other (See Comments)    (Dye)   Iodine Anaphylaxis    Contrast dye - iodine   Molds & Smuts Shortness Of Breath and Other (See Comments)    Congestion and wheezing, also   Shellfish-Derived Products Anaphylaxis, Shortness Of Breath, Swelling and Other (See Comments)    Welts, also   Pholcodine Hives   Azithromycin Nausea Only   Codeine Hives and Nausea Only   Metformin Hcl Er Nausea Only   Oxycodone Nausea Only    Nausea 08/2022 admission.   Tramadol Hcl  Nausea Only   Other Rash and Other (See Comments)    Coban causes welts, also      OBJECTIVE:  Physical Exam  Vitals:   07/27/23 0847  BP: (!) 188/79  Pulse: 97  Weight: 204 lb 3.2 oz (92.6 kg)  Height: 5\' 3"  (1.6 m)   Body mass index is 36.17 kg/m. No results found.  General: well developed, well nourished, very pleasant middle-aged African-American female, seated, in no evident distress  Neurologic Exam Mental Status: Awake and fully alert. Oriented to place and time. Recent and remote memory intact. Attention span, concentration and fund of knowledge appropriate. Mood and affect appropriate.  Cranial Nerves: Pupils equal, briskly reactive to light. Extraocular movements full without nystagmus. Visual fields full to confrontation. Hearing intact. Facial sensation intact. Face, tongue, palate moves normally and symmetrically.  Motor: Normal bulk and tone. Normal strength in all tested extremity muscles Gait and Station: Arises from chair without difficulty. Stance is normal. Gait demonstrates normal stride length and balance without use of RW.  Reflexes: 1+ and symmetric. Toes downgoing.         ASSESSMENT/PLAN: BENNETT KAUZLARICH is a 72 y.o. year old female with history of right facial paresthesias and intermittent headaches which showed good response to topiramate, OSA on CPAP, hx of left thalamic infarct 2/2 SVD, carotid stenosis s/p L TCAR 09/2021 and R TCAR 10/2021 and ocular myasthenia. Dx'd with left breast cancer 07/2022, s/p lumpectomy 08/2022, s/p chemo 01/2023 and radiation 03/2023.     OSA on CPAP : difficulty using CPAP over the past year due to diagnosis of breast cancer s/p lumpectomy and chemo and radiation, difficulty tolerating CPAP due to nausea and more recently due to needing a new mask, recently received new mask but reports prior mask causing irritation on the bridge of her nose. Will request change of interface with use of ResMed F30 hybrid mask to see if  she tolerates this better.  Discussed importance of adequate CPAP compliance and ensuring greater  than 4 hours nightly.  Continue current pressure settings.  Continue to follow with DME company for any needed supplies or CPAP related concerns Chronic headaches: some worsening s/p chemo and radiation but gradually improving. She prefers to continue with topiramate 50 mg nightly at this time, she was advised to call if headaches persist or worsen and interested in dosage adjustment     Follow up in 6 months or call earlier if needed   CC:  PCP: Irena Reichmann, DO    I spent 30 minutes of face-to-face and non-face-to-face time with patient.  This included previsit chart review, lab review, study review, order entry, electronic health record documentation, patient education and discussion regarding above diagnoses and treatment plan and answered all the questions to patient's satisfaction  Ihor Austin, [facility]  [facility] [address] [place], [place] [address]  Phone [phone] Fax [phone] Note: This document was prepared with digital dictation and possible smart phrase technology. Any transcriptional errors that result from this process are unintentional.

## 2023-07-27 NOTE — Progress Notes (Deleted)
[facility] [address] [place] [address] [phone]       OFFICE FOLLOW UP NOTE  [name] Date of Birth:  Dec 06, 1950 Medical Record Number:  [id number]   Reason for visit: CPAP follow-up    SUBJECTIVE:   CHIEF COMPLAINT:  No chief complaint on file.   HPI:   Update 07/27/2023 JM: Patient returns for yearly follow-up.  Remains on topiramate 50 mg nightly, headaches remain well-controlled, ***  CPAP ***        History provided for reference purposes only Update 08/19/2022 JM: Patient returns for initial CPAP compliance visit and headache follow-up.  Evaluated by Dr. Vickey Huger on 8/17 and underwent sleep study on 9/19 which showed mild OSA with total AHI of 9.2/h and recommend restart of AutoPap.  Reports improvement of sleep since restarting CPAP. Issues with mask fitting the past couple of days causing some leaks around the side.  She is otherwise tolerating CPAP well.  Epworth Sleepiness Scale 3/24.  Compliance report showed 100% usage with residual AHI 0.7.  Pressure in the 95 percentile 10.9 on pressure setting of 6-16 with EPR 3.  Leaks in the 9th percentile 2.4.  Headaches well-controlled on topiramate 50 mg nightly, currently experiencing 1-2 headache days per month. Will take tylenol for rescue with benefit.   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   Update 02/11/2022 JM: Patient returns for 62-month follow-up visit.  Overall stable.  Currently, experiencing 3-4 headaches per month. Tolerating lower dose of topiramate 50 mg nightly. Has been having greater difficulty with vision with persistent diplopia and floaters.  Received IVIG 6/16 and is closely being followed by [facility] neurology and multiple ophthalmologist. Also reports previously use CPAP for OSA, previously followed by Dr. Vickey Huger, has not been able to use over the past 6 months as it has not been working correctly (received machine in 2015). She  would like a new machine to get restarted as she did feel better with use.   Continues to follow with vascular surgery and underwent left transcarotid revascularization 2/1 and R TCAR 3/2 without complication.  Also routinely followed by PCP and cardiology.   No further concerns at this time  Update 08/06/2021 Dr. Pearlean Brownie: Patient returns for follow-up after last visit 6 months ago.  She is accompanied by her daughter.  Patient states her headaches are doing well and still occur intermittently off and on once every 2 to 3 weeks.  She is on Topamax 50 mg twice daily but feels that the morning dose of makes her sleepy and wonders if she needs to reduce the dose.  She is not had much episodes of numbness and occasionally has numbness involving her nose and right face which is not bothersome.  She does follow-up with her neurologist at [facility] for myasthenia and getting treated with IVIG infusion symptoms appear to be stable.  She has had no recurrent stroke or TIA symptoms.  She did have follow-up carotid ultrasound done last week in Dr. Verl Dicker office which shows stable appearance of greater than 70% right ICA and 50 to 69% left ICA stenosis.  She has no new symptoms.  She wants to drive.   Update 02/18/2021 Dr. Pearlean Brownie: She returns for follow-up after last visit 3 months ago.  Patient states she has noticed some improvement in her headaches and numbness when she takes Topamax but she is not taking it on a scheduled basis and takes it only as needed.  Symptoms still  persist but not as bothersome.  She has a new complaint of pain and swelling in the left eye and she has seen an ophthalmologist with diagnosed her with a stye.  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  LDL cholesterol on 01/01/2021 was 133 and Dr. Jacinto Halim increase the dose of Crestor which she now takes at 40 mg daily.  She also had carotid ultrasound on 01/07/2021  which showed 70% right ICA and 50 to 69% left ICA stenosis.  She also is complaining of right hand pain and is wearing a carpal tunnel splint.  She has been advised surgery but she is not happy with the surgeon and is thinking about a second opinion.   Initial visit 10/29/2020 Dr. Myrtie Neither. Rehl is a 72 year old African-American lady seen today for office consultation visit.  History is obtained from the patient, review of electronic medical records as well as care everywhere and I personally reviewed pertinent imaging films in PACS.  She has past medical history of hypertension, hyperlipidemia, obesity, asthma, hypothyroidism, seronegative ocular myasthenia, sleep apnea, thyroid carcinoma, diabetes.  Patient states that she has had new onset of numbness involving the right face for the last few months.  This is intermittent and can last for several minutes to hours and can occur at variable frequency couple of times a week to once every couple of weeks.  She is also noticed for the last several weeks headaches which is sharp shooting severe lasting few seconds involving mostly the frontal and occasionally occipital regions.  These also occur to 3 times a day.  She is been taking some Tylenol which seems to help.  There is occasional sweating with these headaches but she denies any tearing of her eyes or redness accompanying the headaches.  Patient has tried taking gabapentin for the numbness which has not helped.  She is presently on 300 mg 3 times daily which was recently increased.  She has actually history of left thalamic lacunar infarct in April 2021 at that time she did have some right face paresthesias and numbness which actually resolved several months after till they recurred again recently.  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  She has been following up with Dr. Bess Harvest neurologist at [facility] and is presently on CellCept 1500 mg twice  daily and prednisone 10 mg daily.  She still has some mild ptosis and intermittent diplopia but symptoms have been stable for quite some time.  She does have an appointment to see Dr. Bess Harvest in June.  She denies any recurrent stroke or TIA symptoms.  She is on aspirin which is tolerating well without bruising or bleeding.  She states her sugars are now better controlled and she is current only on insulin pump and uses her insulin sensor and has an upcoming appointment at the end of the month with endocrinologist Dr. Romero Belling and will have lipid profile and A1c checked at that visit.  She states her blood pressure is usually runs pretty good though today it is elevated at 170/76 as she was late for the appointment and she could not find her office.  She denies any loss of vision with her recent new headaches or muscle aches or pains or jaw claudication or scalp tenderness.     ROS:   14 system review of systems performed and negative with exception of those listed  in HPI  PMH:  Past Medical History:  Diagnosis Date   Arthritis    "all over my body" (03/13/2013)   Asthma    Asymptomatic carotid artery stenosis, bilateral 10/08/2018   Breast cancer (HCC)    GERD (gastroesophageal reflux disease)    H/O hiatal hernia    Headache    pt states she has had headaches for about 6 months "off and on"   Heart murmur    pt had echocardiogram on 09/17/21   Hypercholesterolemia 10/08/2018   Hypertension    Hypothyroidism    Myasthenia gravis (HCC)    "in my eyes; diagnsosed > 7 yr ago" (03/13/2013)   Sleep apnea    on CPAP   Stroke (HCC) 2021   Thyroid carcinoma (HCC)    Type II diabetes mellitus (HCC)     PSH:  Past Surgical History:  Procedure Laterality Date   ABDOMINAL HYSTERECTOMY     ANTERIOR CERVICAL DECOMP/DISCECTOMY FUSION     "I've had severa ORs; always went in from the front" (03/13/2013)   APPENDECTOMY     BREAST LUMPECTOMY WITH RADIOACTIVE SEED AND SENTINEL LYMPH NODE BIOPSY Left  09/02/2022   Procedure: LEFT BREAST LUMPECTOMY WITH RADIOACTIVE SEED AND SENTINEL LYMPH NODE BIOPSY;  Surgeon: Abigail Miyamoto, MD;  Location: [facility] OR;  Service: General;  Laterality: Left;   CARDIAC CATHETERIZATION     "several" (03/13/2013)   CARPAL TUNNEL RELEASE Right    CATARACT EXTRACTION W/ INTRAOCULAR LENS  IMPLANT, BILATERAL Bilateral    CHOLECYSTECTOMY     KNEE ARTHROSCOPY Left    SHOULDER ARTHROSCOPY W/ ROTATOR CUFF REPAIR Left    TONSILLECTOMY     TOTAL THYROIDECTOMY     TRANSCAROTID ARTERY REVASCULARIZATION  Left 09/24/2021   Procedure: LEFT TRANSCAROTID ARTERY REVASCULARIZATION;  Surgeon: Leonie Douglas, MD;  Location: [facility] OR;  Service: Vascular;  Laterality: Left;   TRANSCAROTID ARTERY REVASCULARIZATION  Right 10/23/2021   Procedure: Right Transcarotid Artery Revascularization;  Surgeon: Leonie Douglas, MD;  Location: [facility] OR;  Service: Vascular;  Laterality: Right;   ULTRASOUND GUIDANCE FOR VASCULAR ACCESS Right 09/24/2021   Procedure: ULTRASOUND GUIDANCE FOR VASCULAR ACCESS;  Surgeon: Leonie Douglas, MD;  Location: [facility] OR;  Service: Vascular;  Laterality: Right;   ULTRASOUND GUIDANCE FOR VASCULAR ACCESS Right 10/23/2021   Procedure: ULTRASOUND GUIDANCE FOR VASCULAR ACCESS, LEFT FEMORAL VEIN;  Surgeon: Leonie Douglas, MD;  Location: [facility] OR;  Service: Vascular;  Laterality: Right;    Social History:  Social History   Socioeconomic History   Marital status: Single    Spouse name: Not on file   Number of children: 0   Years of education: college   Highest education level: Not on file  Occupational History   Occupation: retired  Tobacco Use   Smoking status: Never   Smokeless tobacco: Never  Vaping Use   Vaping status: Never Used  Substance and Sexual Activity   Alcohol use: No    Alcohol/week: 0.0 standard drinks of alcohol   Drug use: No   Sexual activity: Never  Other Topics Concern   Not on file  Social History Narrative   Lives alone   Right handed   Drinks  no caffeine   Social Determinants of Health   Financial Resource Strain: Low Risk  (08/12/2022)   Overall Financial Resource Strain (CARDIA)    Difficulty of Paying Living Expenses: Not very hard  Food Insecurity: No Food Insecurity (10/01/2022)   Hunger Vital Sign    Worried  About Running Out of Food in the Last Year: Never true    Ran Out of Food in the Last Year: Never true  Transportation Needs: No Transportation Needs (10/01/2022)   PRAPARE - Administrator, Civil Service (Medical): No    Lack of Transportation (Non-Medical): No  Physical Activity: Not on file  Stress: Not on file  Social Connections: Not on file  Intimate Partner Violence: Not At Risk (10/01/2022)   Humiliation, Afraid, Rape, and Kick questionnaire    Fear of Current or Ex-Partner: No    Emotionally Abused: No    Physically Abused: No    Sexually Abused: No    Family History:  Family History  Problem Relation Age of Onset   Coronary artery disease Sister        s/p coronary stenting   Stroke Sister 65   Diabetes Sister    Congestive Heart Failure Mother    Asthma Mother    Diabetes Mother    Congestive Heart Failure Father    Lung cancer Father    Asthma Father    Diabetes Father    Diabetes Brother     Medications:   Current Outpatient Medications on File Prior to Visit  Medication Sig Dispense Refill   acetaminophen (TYLENOL) 500 MG tablet Take 500 mg by mouth every 6 (six) hours as needed for moderate pain.     aspirin EC 81 MG EC tablet Take 1 tablet (81 mg total) by mouth daily.     Biotin 5000 MCG CAPS Take 5,000 mcg by mouth daily with breakfast.      cycloSPORINE (RESTASIS) 0.05 % ophthalmic emulsion Place 1 drop into both eyes 2 (two) times daily. 0.4 mL 0   diclofenac Sodium (VOLTAREN) 1 % GEL Apply 2 g topically 4 (four) times daily. (Patient taking differently: Apply 1 application  topically daily as needed (pain).) 2 g 0   diphenhydrAMINE 25 mg in sodium chloride 0.9 % 50 mL  Inject 25 mg into the vein See admin instructions. Administer 25 mg intravenously at start of Gamunex infusion, then administer 25 mg during infusion     diphenoxylate-atropine (LOMOTIL) 2.5-0.025 MG tablet Take 1-2 tablets by mouth 4 (four) times daily as needed for diarrhea or loose stools. 30 tablet 2   ELDERBERRY PO Take 1 tablet by mouth daily.     empagliflozin (JARDIANCE) 25 MG TABS tablet Take 25 mg by mouth daily.     esomeprazole (NEXIUM) 40 MG capsule Take 1 capsule (40 mg total) by mouth 2 (two) times daily. 60 capsule 0   Evolocumab with Infusor (REPATHA PUSHTRONEX SYSTEM) 420 MG/3.5ML SOCT Inject 420 mg into the skin every 30 (thirty) days.     Exenatide ER (BYDUREON BCISE) 2 MG/0.85ML AUIJ Inject 2 mg into the skin every Monday.     ezetimibe (ZETIA) 10 MG tablet TAKE 1 TABLET BY MOUTH EVERY DAY 90 tablet 1   furosemide (LASIX) 40 MG tablet Take 40 mg by mouth daily as needed for edema.     gabapentin (NEURONTIN) 100 MG capsule Take 1-2 capsules (100-200 mg total) by mouth at bedtime. 60 capsule 3   Immune Globulin, Human, 40 GM/400ML SOLN Inject into the vein every 6 (six) weeks. Infusions are administered at home by home health nurse - last infusion 09/08/21 and 09/09/21; next infusion due 10/19/21 and 10/20/21     insulin degludec (TRESIBA FLEXTOUCH) 100 UNIT/ML FlexTouch Pen Inject 15 Units into the skin in the morning.  Insulin Disposable Pump (OMNIPOD DASH 5 PACK PODS) MISC Use with Humalog     insulin lispro (HUMALOG) 100 UNIT/ML injection Inject into the skin See admin instructions. Inject subcutaneously 3 (three) times daily with meals With meals through the Omnipod Dash Pump every 72 hours as directed     levothyroxine (SYNTHROID) 175 MCG tablet Take 175 mcg by mouth daily before breakfast.     lidocaine-prilocaine (EMLA) cream Apply to affected area once 30 g 3   loperamide (IMODIUM A-D) 2 MG tablet Take 2 mg by mouth 4 (four) times daily as needed for diarrhea or loose  stools.     loratadine (CLARITIN) 10 MG tablet Take 1 tablet (10 mg total) by mouth at bedtime. 30 tablet 0   meclizine (ANTIVERT) 25 MG tablet Take 25 mg by mouth 3 (three) times daily as needed for dizziness or nausea (or migraine-related nausea).     montelukast (SINGULAIR) 10 MG tablet Take 1 tablet (10 mg total) by mouth every morning. 30 tablet 0   mycophenolate (CELLCEPT) 500 MG tablet Take 1,000 mg by mouth 2 (two) times daily. For myasthenia gravis     olmesartan (BENICAR) 20 MG tablet Take 1 tablet (20 mg total) by mouth daily. 30 tablet 0   ondansetron (ZOFRAN) 8 MG tablet Take 1 tablet (8 mg total) by mouth every 8 (eight) hours as needed for nausea or vomiting. 30 tablet 1   predniSONE (DELTASONE) 2.5 MG tablet Take 7.5 mg by mouth every morning. For myasthenia gravis     PRESCRIPTION MEDICATION Pt uses a cpap nightly     PROAIR HFA 108 (90 BASE) MCG/ACT inhaler Inhale 2 puffs into the lungs every 6 (six) hours as needed for wheezing or shortness of breath.      prochlorperazine (COMPAZINE) 10 MG tablet Take 1 tablet (10 mg total) by mouth every 6 (six) hours as needed for nausea or vomiting. 30 tablet 1   riTUXimab (RITUXAN) 500 MG/50ML injection Inject into the vein every 6 (six) months.     rosuvastatin (CRESTOR) 40 MG tablet Take 1 tablet (40 mg total) by mouth daily. 90 tablet 3   tamoxifen (NOLVADEX) 20 MG tablet Take 1 tablet (20 mg total) by mouth daily. 90 tablet 1   topiramate (TOPAMAX) 50 MG tablet Take 1 tablet (50 mg total) by mouth at bedtime. 90 tablet 1   traMADol (ULTRAM) 50 MG tablet Take 1 tablet (50 mg total) by mouth every 6 (six) hours as needed for moderate pain. 25 tablet 0   vitamin B-12 (CYANOCOBALAMIN) 1000 MCG tablet Take 1,000 mcg by mouth daily.     Vitamin D, Ergocalciferol, (DRISDOL) 50000 UNITS CAPS capsule Take 50,000 Units by mouth every Monday.      No current facility-administered medications on file prior to visit.    Allergies:   Allergies   Allergen Reactions   Contrast Media [Iodinated Contrast Media] Anaphylaxis    01/10/15 and 09/18/2021 --PT GIVEN 13 HR PRE MEDS FOR CT with contrast TOLERATED IV CONTRAST W/O ANY REACTION   Fluorescein Shortness Of Breath and Other (See Comments)    (Dye)   Iodine Anaphylaxis    Contrast dye - iodine   Molds & Smuts Shortness Of Breath and Other (See Comments)    Congestion and wheezing, also   Shellfish-Derived Products Anaphylaxis, Shortness Of Breath, Swelling and Other (See Comments)    Welts, also   Pholcodine Hives   Azithromycin Nausea Only   Codeine Hives and Nausea Only  Metformin Hcl Er Nausea Only   Oxycodone Nausea Only    Nausea 08/2022 admission.   Tramadol Hcl Nausea Only   Other Rash and Other (See Comments)    Coban causes welts, also      OBJECTIVE:  Physical Exam  There were no vitals filed for this visit.  There is no height or weight on file to calculate BMI. No results found.   General: well developed, well nourished, very pleasant middle-aged African-American female, seated, in no evident distress Head: head normocephalic and atraumatic.   Neck: supple with no carotid or supraclavicular bruits Cardiovascular: regular rate and rhythm, no murmurs Musculoskeletal: no deformity Skin:  no rash/petichiae Vascular:  Normal pulses all extremities   Neurologic Exam Mental Status: Awake and fully alert. Oriented to place and time. Recent and remote memory intact. Attention span, concentration and fund of knowledge appropriate. Mood and affect appropriate.  Cranial Nerves: Pupils equal, briskly reactive to light. Extraocular movements full without nystagmus. Visual fields full to confrontation. Hearing intact. Facial sensation intact. Face, tongue, palate moves normally and symmetrically.  Motor: Normal bulk and tone. Normal strength in all tested extremity muscles Sensory.: intact to touch , pinprick , position and vibratory sensation.  Coordination: Rapid  alternating movements normal in all extremities. Finger-to-nose and heel-to-shin performed accurately bilaterally. Gait and Station: Arises from chair without difficulty. Stance is normal. Gait demonstrates normal stride length and balance without use of RW.  Reflexes: 1+ and symmetric. Toes downgoing.         ASSESSMENT/PLAN: [name] is a 72 y.o. 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  Continue current pressure settings.  Advised to follow-up with [employer] in regards to his mask leak concern.  Discussed continued nightly usage with ensuring greater than 4 hours nightly for optimal benefit and per insurance purposes.  Continue to follow with DME company for any needed supplies or CPAP related concerns Chronic headaches: Well-controlled on topiramate 50 mg nightly, will continue Ocular myasthenia: Continue to follow with [facility] neurology Carotid stenosis s/p b/l TCAR: Continue to follow with vascular surgery      Follow up in 1 year or call earlier if needed   CC:  PCP: Irena Reichmann, DO    I spent 21 minutes of face-to-face and non-face-to-face time with patient.  This included previsit chart review, lab review, study review, order entry, electronic health record documentation, patient education and discussion regarding above diagnoses and treatment plan and answered all the questions to patient's satisfaction   Ihor Austin, [facility]  [facility] [address] [place], [place] [address]  Phone [phone] Fax [phone] Note: This document was prepared with digital dictation and possible smart phrase technology. Any  transcriptional errors that result from this process are unintentional.

## 2023-07-27 NOTE — Patient Instructions (Addendum)
Your Plan:  Restart CPAP therapy, order placed to try a different mask - you should be called by [employer] [facility] but if you do not hear from them by the end of the week, you can call them at [phone]  Continue topamax 50mg  nightly - if headaches continue to persist or worsen, we can consider increasing dose - please keep me updated     Follow up in 6 months or call earlier if needed    Thank you for coming to see [place] at [facility]. I hope we have been able to provide you high quality care today.  You may receive a patient satisfaction survey over the next few weeks. We would appreciate your feedback and comments so that we may continue to improve ourselves and the health of our patients.

## 2023-07-28 ENCOUNTER — Encounter: Payer: Self-pay | Admitting: Nurse Practitioner

## 2023-07-28 ENCOUNTER — Ambulatory Visit: Payer: Medicare Other | Admitting: Adult Health

## 2023-07-28 ENCOUNTER — Inpatient Hospital Stay: Payer: Medicare Other | Attending: Hematology | Admitting: Nurse Practitioner

## 2023-07-28 ENCOUNTER — Other Ambulatory Visit: Payer: Self-pay

## 2023-07-28 VITALS — BP 155/59 | HR 97 | Temp 97.7°F | Resp 16 | Wt 202.2 lb

## 2023-07-28 DIAGNOSIS — E7800 Pure hypercholesterolemia, unspecified: Secondary | ICD-10-CM | POA: Diagnosis not present

## 2023-07-28 DIAGNOSIS — Z90710 Acquired absence of both cervix and uterus: Secondary | ICD-10-CM | POA: Insufficient documentation

## 2023-07-28 DIAGNOSIS — Z79899 Other long term (current) drug therapy: Secondary | ICD-10-CM | POA: Insufficient documentation

## 2023-07-28 DIAGNOSIS — Z7982 Long term (current) use of aspirin: Secondary | ICD-10-CM | POA: Diagnosis not present

## 2023-07-28 DIAGNOSIS — M8580 Other specified disorders of bone density and structure, unspecified site: Secondary | ICD-10-CM | POA: Insufficient documentation

## 2023-07-28 DIAGNOSIS — C50412 Malignant neoplasm of upper-outer quadrant of left female breast: Secondary | ICD-10-CM | POA: Diagnosis not present

## 2023-07-28 DIAGNOSIS — Z923 Personal history of irradiation: Secondary | ICD-10-CM | POA: Diagnosis not present

## 2023-07-28 DIAGNOSIS — T451X5D Adverse effect of antineoplastic and immunosuppressive drugs, subsequent encounter: Secondary | ICD-10-CM | POA: Insufficient documentation

## 2023-07-28 DIAGNOSIS — Z801 Family history of malignant neoplasm of trachea, bronchus and lung: Secondary | ICD-10-CM | POA: Insufficient documentation

## 2023-07-28 DIAGNOSIS — Z170 Estrogen receptor positive status [ER+]: Secondary | ICD-10-CM | POA: Insufficient documentation

## 2023-07-28 DIAGNOSIS — Z79810 Long term (current) use of selective estrogen receptor modulators (SERMs): Secondary | ICD-10-CM | POA: Insufficient documentation

## 2023-07-28 DIAGNOSIS — Z8673 Personal history of transient ischemic attack (TIA), and cerebral infarction without residual deficits: Secondary | ICD-10-CM | POA: Insufficient documentation

## 2023-07-28 DIAGNOSIS — G620 Drug-induced polyneuropathy: Secondary | ICD-10-CM | POA: Diagnosis not present

## 2023-07-28 MED ORDER — PREGABALIN 25 MG PO CAPS: 25.0000 mg | ORAL_CAPSULE | Freq: Two times a day (BID) | ORAL | 0 refills | Status: AC

## 2023-07-28 NOTE — Progress Notes (Signed)
CLINIC:  Survivorship   Patient Care Team: Irena Reichmann, DO as PCP - General (Family Medicine) Micki Riley, MD as Consulting Physician (Neurology) Ihor Austin, NP as Nurse Practitioner (Neurology) Donnelly Angelica, RN as Oncology Nurse Navigator Pershing Proud, RN as Oncology Nurse Navigator Abigail Miyamoto, MD as Consulting Physician (General Surgery) Malachy Mood, MD as Consulting Physician (Hematology) Dorothy Puffer, MD as Consulting Physician (Radiation Oncology) Dianne Dun, MD as Consulting Physician (Rheumatology) Mammography, [facility] as Consulting Physician (Diagnostic Radiology) Pollyann Samples, NP as Nurse Practitioner (Nurse Practitioner)   REASON FOR VISIT:  Routine follow-up post-treatment for a recent history of breast cancer.   INTERVAL HISTORY:  [name] presents to the Survivorship Clinic today for our initial meeting to review her survivorship care plan detailing her treatment course for breast cancer, as well as monitoring long-term side effects of that treatment, education regarding health maintenance, screening, and overall wellness and health promotion.     Overall, [name] is doing well, recovering from treatment. Still has residual fatigue from chemo but remains active. Gabapentin made her sleepy and did not help neuropathy. Still has it in feet and 5th finger of right hand. No fall or functional difficulties. Tolerating tamoxifen with mild hot flash and hair growth. Side effects are tolerable. Has mild intermittent breast/axilla pain since surgery, denies new lump/mass. MG is stable.   REVIEW OF SYSTEMS:  All other systems reviewed and negative     ONCOLOGY TREATMENT TEAM:  1. Surgeon:  Dr. Magnus Ivan at [facility] 2. Medical Oncologist: Dr. Mosetta Putt  3. Radiation Oncologist: Dr. Mitzi Hansen    PAST MEDICAL/SURGICAL HISTORY:  Past Medical History:  Diagnosis Date   Arthritis    "all over my body" (03/13/2013)   Asthma     Asymptomatic carotid artery stenosis, bilateral 10/08/2018   Breast cancer (HCC)    GERD (gastroesophageal reflux disease)    H/O hiatal hernia    Headache    pt states she has had headaches for about 6 months "off and on"   Heart murmur    pt had echocardiogram on 09/17/21   Hypercholesterolemia 10/08/2018   Hypertension    Hypothyroidism    Myasthenia gravis (HCC)    "in my eyes; diagnsosed > 7 yr ago" (03/13/2013)   Sleep apnea    on CPAP   Stroke (HCC) 2021   Thyroid carcinoma (HCC)    Type II diabetes mellitus (HCC)    Past Surgical History:  Procedure Laterality Date   ABDOMINAL HYSTERECTOMY     ANTERIOR CERVICAL DECOMP/DISCECTOMY FUSION     "I've had severa ORs; always went in from the front" (03/13/2013)   APPENDECTOMY     BREAST LUMPECTOMY WITH RADIOACTIVE SEED AND SENTINEL LYMPH NODE BIOPSY Left 09/02/2022   Procedure: LEFT BREAST LUMPECTOMY WITH RADIOACTIVE SEED AND SENTINEL LYMPH NODE BIOPSY;  Surgeon: Abigail Miyamoto, MD;  Location: [facility] OR;  Service: General;  Laterality: Left;   CARDIAC CATHETERIZATION     "several" (03/13/2013)   CARPAL TUNNEL RELEASE Right    CATARACT EXTRACTION W/ INTRAOCULAR LENS  IMPLANT, BILATERAL Bilateral    CHOLECYSTECTOMY     KNEE ARTHROSCOPY Left    SHOULDER ARTHROSCOPY W/ ROTATOR CUFF REPAIR Left    TONSILLECTOMY     TOTAL THYROIDECTOMY     TRANSCAROTID ARTERY REVASCULARIZATION  Left 09/24/2021   Procedure: LEFT TRANSCAROTID ARTERY REVASCULARIZATION;  Surgeon: Leonie Douglas, MD;  Location: [facility] OR;  Service: Vascular;  Laterality: Left;   TRANSCAROTID ARTERY REVASCULARIZATION  Right 10/23/2021   Procedure: Right Transcarotid Artery Revascularization;  Surgeon: Leonie Douglas, MD;  Location: [facility] OR;  Service: Vascular;  Laterality: Right;   ULTRASOUND GUIDANCE FOR VASCULAR ACCESS Right 09/24/2021   Procedure: ULTRASOUND GUIDANCE FOR VASCULAR ACCESS;  Surgeon: Leonie Douglas, MD;  Location: [facility] OR;  Service: Vascular;  Laterality: Right;    ULTRASOUND GUIDANCE FOR VASCULAR ACCESS Right 10/23/2021   Procedure: ULTRASOUND GUIDANCE FOR VASCULAR ACCESS, LEFT FEMORAL VEIN;  Surgeon: Leonie Douglas, MD;  Location: [facility] OR;  Service: Vascular;  Laterality: Right;     ALLERGIES:  Allergies  Allergen Reactions   Contrast Media [Iodinated Contrast Media] Anaphylaxis    01/10/15 and 09/18/2021 --PT GIVEN 13 HR PRE MEDS FOR CT with contrast TOLERATED IV CONTRAST W/O ANY REACTION   Fluorescein Shortness Of Breath and Other (See Comments)    (Dye)   Iodine Anaphylaxis    Contrast dye - iodine   Molds & Smuts Shortness Of Breath and Other (See Comments)    Congestion and wheezing, also   Shellfish-Derived Products Anaphylaxis, Shortness Of Breath, Swelling and Other (See Comments)    Welts, also   Pholcodine Hives   Azithromycin Nausea Only   Codeine Hives and Nausea Only   Metformin Hcl Er Nausea Only   Oxycodone Nausea Only    Nausea 08/2022 admission.   Tramadol Hcl Nausea Only   Other Rash and Other (See Comments)    Coban causes welts, also     CURRENT MEDICATIONS:  Outpatient Encounter Medications as of 07/28/2023  Medication Sig Note   acetaminophen (TYLENOL) 500 MG tablet Take 500 mg by mouth every 6 (six) hours as needed for moderate pain.    aspirin EC 81 MG EC tablet Take 1 tablet (81 mg total) by mouth daily.    Biotin 5000 MCG CAPS Take 5,000 mcg by mouth daily with breakfast.     cycloSPORINE (RESTASIS) 0.05 % ophthalmic emulsion Place 1 drop into both eyes 2 (two) times daily.    diclofenac Sodium (VOLTAREN) 1 % GEL Apply 2 g topically 4 (four) times daily. (Patient taking differently: Apply 1 application  topically daily as needed (pain).)    diphenhydrAMINE 25 mg in sodium chloride 0.9 % 50 mL Inject 25 mg into the vein See admin instructions. Administer 25 mg intravenously at start of Gamunex infusion, then administer 25 mg during infusion    diphenoxylate-atropine (LOMOTIL) 2.5-0.025 MG tablet Take 1-2 tablets by  mouth 4 (four) times daily as needed for diarrhea or loose stools.    ELDERBERRY PO Take 1 tablet by mouth daily.    empagliflozin (JARDIANCE) 25 MG TABS tablet Take 25 mg by mouth daily.    esomeprazole (NEXIUM) 40 MG capsule Take 1 capsule (40 mg total) by mouth 2 (two) times daily.    Evolocumab with Infusor (REPATHA PUSHTRONEX SYSTEM) 420 MG/3.5ML SOCT Inject 420 mg into the skin every 30 (thirty) days.    Exenatide ER (BYDUREON BCISE) 2 MG/0.85ML AUIJ Inject 2 mg into the skin every Monday.    ezetimibe (ZETIA) 10 MG tablet TAKE 1 TABLET BY MOUTH EVERY DAY    furosemide (LASIX) 40 MG tablet Take 40 mg by mouth daily as needed for edema.    Immune Globulin, Human, 40 GM/400ML SOLN Inject into the vein every 6 (six) weeks. Infusions are administered at home by home health nurse - last infusion 09/08/21 and 09/09/21; next infusion due 10/19/21 and 10/20/21 09/17/2021: Current dose is unknown by patient  insulin degludec (TRESIBA FLEXTOUCH) 100 UNIT/ML FlexTouch Pen Inject 15 Units into the skin in the morning.    Insulin Disposable Pump (OMNIPOD DASH 5 PACK PODS) MISC Use with Humalog    insulin lispro (HUMALOG) 100 UNIT/ML injection Inject into the skin See admin instructions. Inject subcutaneously 3 (three) times daily with meals With meals through the Omnipod Dash Pump every 72 hours as directed    Levothyroxine Sodium 200 MCG CAPS Take 200 mcg by mouth daily before breakfast.    lidocaine-prilocaine (EMLA) cream Apply to affected area once    loperamide (IMODIUM A-D) 2 MG tablet Take 2 mg by mouth 4 (four) times daily as needed for diarrhea or loose stools.    loratadine (CLARITIN) 10 MG tablet Take 1 tablet (10 mg total) by mouth at bedtime.    meclizine (ANTIVERT) 25 MG tablet Take 25 mg by mouth 3 (three) times daily as needed for dizziness or nausea (or migraine-related nausea).    montelukast (SINGULAIR) 10 MG tablet Take 1 tablet (10 mg total) by mouth every morning.    mycophenolate  (CELLCEPT) 500 MG tablet Take 1,000 mg by mouth 2 (two) times daily. For myasthenia gravis    olmesartan (BENICAR) 20 MG tablet Take 1 tablet (20 mg total) by mouth daily.    ondansetron (ZOFRAN) 8 MG tablet Take 1 tablet (8 mg total) by mouth every 8 (eight) hours as needed for nausea or vomiting.    predniSONE (DELTASONE) 2.5 MG tablet Take 7.5 mg by mouth every morning. For myasthenia gravis 09/17/2021: continuous   pregabalin (LYRICA) 25 MG capsule Take 1 capsule (25 mg total) by mouth 2 (two) times daily.    PRESCRIPTION MEDICATION Pt uses a cpap nightly    PROAIR HFA 108 (90 BASE) MCG/ACT inhaler Inhale 2 puffs into the lungs every 6 (six) hours as needed for wheezing or shortness of breath.     prochlorperazine (COMPAZINE) 10 MG tablet Take 1 tablet (10 mg total) by mouth every 6 (six) hours as needed for nausea or vomiting.    riTUXimab (RITUXAN) 500 MG/50ML injection Inject into the vein every 6 (six) months. 09/17/2021: Administered at [facility] - pt could not verify current dose   tamoxifen (NOLVADEX) 20 MG tablet Take 1 tablet (20 mg total) by mouth daily.    topiramate (TOPAMAX) 50 MG tablet Take 1 tablet (50 mg total) by mouth at bedtime.    traMADol (ULTRAM) 50 MG tablet Take 1 tablet (50 mg total) by mouth every 6 (six) hours as needed for moderate pain.    vitamin B-12 (CYANOCOBALAMIN) 1000 MCG tablet Take 1,000 mcg by mouth daily.    Vitamin D, Ergocalciferol, (DRISDOL) 50000 UNITS CAPS capsule Take 50,000 Units by mouth every Monday.     [DISCONTINUED] gabapentin (NEURONTIN) 100 MG capsule Take 1-2 capsules (100-200 mg total) by mouth at bedtime.    rosuvastatin (CRESTOR) 40 MG tablet Take 1 tablet (40 mg total) by mouth daily.    No facility-administered encounter medications on file as of 07/28/2023.     ONCOLOGIC FAMILY HISTORY:  Family History  Problem Relation Age of Onset   Coronary artery disease Sister        s/p coronary stenting   Stroke Sister 29    Diabetes Sister    Congestive Heart Failure Mother    Asthma Mother    Diabetes Mother    Congestive Heart Failure Father    Lung cancer Father    Asthma Father  Diabetes Father    Diabetes Brother      GENETIC COUNSELING/TESTING: N/A  SOCIAL HISTORY:  ELISHIA ROETHEL is here with her sister.    PHYSICAL EXAMINATION:  Vital Signs:   Vitals:   07/28/23 1029  BP: (!) 155/59  Pulse: 97  Resp: 16  Temp: 97.7 F (36.5 C)  SpO2: 100%   Filed Weights   07/28/23 1029  Weight: 202 lb 3.2 oz (91.7 kg)   General: Well-nourished, well-appearing female in no acute distress.  HEENT:   Sclerae anicteric.  Lymph: No cervical, supraclavicular, or infraclavicular lymphadenopathy noted on palpation.  Respiratory: breathing non-labored.  GI: Abdomen soft and round; non-tender, non-distended. Bowel sounds normoactive.  Neuro: No focal deficits. Steady gait.  Psych: Mood and affect normal and appropriate for situation.  Extremities: No edema. MSK: No focal spinal tenderness to palpation.  Full range of motion in bilateral upper extremities Skin: Warm and dry. Breast: s/p L lumpectomy, incisions completely healed. Mild diffuse hyperpigmentation. No palpable mass or nodularity in either breast or axilla that I could appreciate. Small superficial cyst to R axilla   LABORATORY DATA:  None for this visit.  DIAGNOSTIC IMAGING:  None for this visit.      ASSESSMENT AND PLAN:  Ms.. [name] is a pleasant 72 y.o. 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  She presents to the Survivorship Clinic for our initial meeting and routine follow-up post-completion of treatment for breast cancer.    1. Stage I left breast cancer:  Ms. Rease is continuing to recover from definitive treatment for breast  cancer. Still experiencing residual fatigue and neuropathy. 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 Breast exam is benign. She will follow-up with her medical oncologist, Dr. Mosetta Putt in 10/2023 with history and physical exam per surveillance protocol.  She will continue her anti-estrogen therapy with Tamoxifen. Thus far, she is tolerating well, with minimal side effects including hot flash and hair growth. She was instructed to make Dr. Mosetta Putt or myself aware if she begins to experience any worsening side effects of the medication and I could see her back in clinic to help manage those side effects, as needed. Given her weak ER positivity on initial biopsy, and ER 0% on surgical path, have low threshold to stop Tamoxifen if she has intolerable SEs.   Today, a comprehensive survivorship care plan and treatment summary was reviewed with the patient today detailing her breast cancer diagnosis, treatment course, potential late/long-term effects of treatment, appropriate follow-up care with recommendations for the future, and patient education resources.  A copy of this summary, along with a letter will be sent to the patient's primary care provider via mail/fax/In Basket message after today's visit.    2. Bone health:  Given Ms. Embleton's age/history of breast cancer and osteopenia, Tamoxifen is anti-estrogen of choice. Her last DEXA on 03/2023 showed mild osteopenia. She is on calcium and vit D. I encouraged weight bearing exercise as tolerated. She was given education on specific activities to promote bone health.  3. Cancer screening:  Due to Ms. Heeg's history and her age, she should receive screening for skin cancers and colon cancers. She is s/p total hysterectomy. The information and recommendations are listed on the patient's comprehensive care plan/treatment summary and were reviewed in detail with the patient.    4. Health maintenance and wellness  promotion: Ms. [place] was encouraged to consume  5-7 servings of fruits and vegetables per day. She was also encouraged to engage in moderate to vigorous exercise for 30 minutes per day most days of the week. She was instructed to limit her alcohol consumption and continue to abstain from tobacco use.   5. Support services/counseling: It is not uncommon for this period of the patient's cancer care trajectory to be one of many emotions and stressors.  We discussed an opportunity for her to participate in the next session of [facility] ("Finding Your New Normal") support group series designed for patients after they have completed treatment.   Ms. Nachtman was encouraged to take advantage of our many other support services programs, support groups, and/or counseling in coping with her new life as a cancer survivor after completing anti-cancer treatment.  She was offered support today through active listening and expressive supportive counseling.  She was given information regarding our available services and encouraged to contact me with any questions or for help enrolling in any of our support group/programs.    Dispo:   -Return to cancer center 10/2023  -Mammogram due in 07/2023 -Follow up with surgery as indicated  -She is welcome to return back to the Survivorship Clinic at any time; no additional follow-up needed at this time.  -Consider referral back to survivorship as a long-term survivor for continued surveillance  Orders Placed This Encounter  Procedures   MM DIAG BREAST TOMO BILATERAL    Standing Status:   Future    Standing Expiration Date:   07/27/2024    Order Specific Question:   Reason for Exam (SYMPTOM  OR DIAGNOSIS REQUIRED)    Answer:   L breast cancer 07/2022    Order Specific Question:   Preferred imaging location?    Answer:   [facility]     A total of (30) minutes of face-to-face time was spent with this patient with greater than 50% of that time in counseling and care-coordination.   Santiago Glad, NP Survivorship  Program [facility] [phone]   Note: PRIMARY CARE PROVIDER Irena Reichmann, [place] [phone]

## 2023-07-29 ENCOUNTER — Telehealth: Payer: Self-pay

## 2023-08-03 ENCOUNTER — Telehealth: Payer: Self-pay | Admitting: Adult Health

## 2023-08-03 NOTE — Telephone Encounter (Signed)
Spoke to patient made her aware that Orders were sent over 07/29/2023 and  resent today.Made pt aware that if she doesn't hear anything by Friday she can call [employer] to check on her order , Patient expressed understanding and thanked me for calling

## 2023-08-03 NOTE — Telephone Encounter (Signed)
Pt called to check on if order has been send to DME for mask. Would like a call back.

## 2023-08-04 ENCOUNTER — Other Ambulatory Visit: Payer: Self-pay

## 2023-08-04 ENCOUNTER — Telehealth: Payer: Self-pay

## 2023-08-04 NOTE — Telephone Encounter (Signed)
Pt called asking to speak with Santiago Glad, NP regarding a medication Lacie prescribed for the pt.  Pt stated she wanted to know if it is OK to take the medication with another medication that was prescribed by another provider.  Asked pt which medications she's referring to.  Pt stated Pregabalin (Lyrica) and topiramate (Topamax).  Pt wants to know if she can take both of the medications since they have similar side effects.  Informed pt that this nurse will check with our pharmacist to see if the 2 medications have interactions and will f/u with her on their response.  Pt verbalized understanding and will await this nurse's return call.

## 2023-08-04 NOTE — Telephone Encounter (Signed)
F/u with pt regarding Topamax and Lyrica interaction questions.  Stated that the pharmacist responded and both medications can be taking together.  Stated that the pharmacist said if the pt is taking the Topamax at bedtime it can cause drowsiness in the morning.  Pt verbalized understanding and had no further questions or concerns at this time.

## 2023-08-06 DIAGNOSIS — E890 Postprocedural hypothyroidism: Secondary | ICD-10-CM | POA: Diagnosis not present

## 2023-08-06 DIAGNOSIS — J449 Chronic obstructive pulmonary disease, unspecified: Secondary | ICD-10-CM | POA: Diagnosis not present

## 2023-08-06 DIAGNOSIS — R053 Chronic cough: Secondary | ICD-10-CM | POA: Diagnosis not present

## 2023-08-06 DIAGNOSIS — Z170 Estrogen receptor positive status [ER+]: Secondary | ICD-10-CM | POA: Diagnosis not present

## 2023-08-06 DIAGNOSIS — C50412 Malignant neoplasm of upper-outer quadrant of left female breast: Secondary | ICD-10-CM | POA: Diagnosis not present

## 2023-08-06 DIAGNOSIS — E119 Type 2 diabetes mellitus without complications: Secondary | ICD-10-CM | POA: Diagnosis not present

## 2023-08-06 DIAGNOSIS — G4733 Obstructive sleep apnea (adult) (pediatric): Secondary | ICD-10-CM | POA: Diagnosis not present

## 2023-08-06 DIAGNOSIS — I69351 Hemiplegia and hemiparesis following cerebral infarction affecting right dominant side: Secondary | ICD-10-CM | POA: Diagnosis not present

## 2023-08-06 DIAGNOSIS — Z9989 Dependence on other enabling machines and devices: Secondary | ICD-10-CM | POA: Diagnosis not present

## 2023-08-06 DIAGNOSIS — G7000 Myasthenia gravis without (acute) exacerbation: Secondary | ICD-10-CM | POA: Diagnosis not present

## 2023-08-09 DIAGNOSIS — H3500 Unspecified background retinopathy: Secondary | ICD-10-CM | POA: Diagnosis not present

## 2023-08-09 DIAGNOSIS — D8989 Other specified disorders involving the immune mechanism, not elsewhere classified: Secondary | ICD-10-CM | POA: Diagnosis not present

## 2023-08-09 DIAGNOSIS — Z79899 Other long term (current) drug therapy: Secondary | ICD-10-CM | POA: Diagnosis not present

## 2023-08-11 ENCOUNTER — Telehealth: Payer: Self-pay

## 2023-08-11 DIAGNOSIS — I129 Hypertensive chronic kidney disease with stage 1 through stage 4 chronic kidney disease, or unspecified chronic kidney disease: Secondary | ICD-10-CM | POA: Diagnosis not present

## 2023-08-11 DIAGNOSIS — Z0000 Encounter for general adult medical examination without abnormal findings: Secondary | ICD-10-CM | POA: Diagnosis not present

## 2023-08-11 DIAGNOSIS — I69393 Ataxia following cerebral infarction: Secondary | ICD-10-CM | POA: Diagnosis not present

## 2023-08-11 DIAGNOSIS — E890 Postprocedural hypothyroidism: Secondary | ICD-10-CM | POA: Diagnosis not present

## 2023-08-11 DIAGNOSIS — E7800 Pure hypercholesterolemia, unspecified: Secondary | ICD-10-CM | POA: Diagnosis not present

## 2023-08-11 DIAGNOSIS — G720 Drug-induced myopathy: Secondary | ICD-10-CM | POA: Diagnosis not present

## 2023-08-11 DIAGNOSIS — Z9641 Presence of insulin pump (external) (internal): Secondary | ICD-10-CM | POA: Diagnosis not present

## 2023-08-11 DIAGNOSIS — N1831 Chronic kidney disease, stage 3a: Secondary | ICD-10-CM | POA: Diagnosis not present

## 2023-08-11 DIAGNOSIS — E118 Type 2 diabetes mellitus with unspecified complications: Secondary | ICD-10-CM | POA: Diagnosis not present

## 2023-08-11 DIAGNOSIS — I6930 Unspecified sequelae of cerebral infarction: Secondary | ICD-10-CM | POA: Diagnosis not present

## 2023-08-11 DIAGNOSIS — I6523 Occlusion and stenosis of bilateral carotid arteries: Secondary | ICD-10-CM | POA: Diagnosis not present

## 2023-08-11 DIAGNOSIS — C50919 Malignant neoplasm of unspecified site of unspecified female breast: Secondary | ICD-10-CM | POA: Diagnosis not present

## 2023-08-11 NOTE — Patient Instructions (Signed)
Visit Information  Thank you for taking time to visit with me today. Please don't hesitate to contact me if I can be of assistance to you.   Following are the goals we discussed today:   Goals Addressed             This Visit's Progress    COMPLETED: Care Coordination Activites-No follow up required       Care Coordination Interventions: Discussed services and support. Assessed SDOH. Advised to discuss with primary care physician if services needed in the future.          If you are experiencing a Mental Health or Behavioral Health Crisis or need someone to talk to, please call the Suicide and Crisis Lifeline: 988   Patient verbalizes understanding of instructions and care plan provided today and agrees to view in MyChart. Active MyChart status and patient understanding of how to access instructions and care plan via MyChart confirmed with patient.     The patient has been provided with contact information for the care management team and has been advised to call with any health related questions or concerns.   Bary Leriche, RN, MSN RN Care Manager [facility], Population Health Direct Dial: [phone]  Fax: [phone] Website: [email or web address]

## 2023-08-11 NOTE — Patient Outreach (Signed)
  Care Coordination   In Person Provider Office Visit Note   08/11/2023 Name: [name] MRN: [id number] DOB: 1950-10-28  [name] is a 72 y.o. year old female who sees [name], [place] for primary care. I engaged with [name] in the providers office today.  What matters to the patients health and wellness today?  none    Goals Addressed             This Visit's Progress    COMPLETED: Care Coordination Activites-No follow up required       Care Coordination Interventions: Discussed services and support. Assessed SDOH. Advised to discuss with primary care physician if services needed in the future.          SDOH assessments and interventions completed:  Yes  SDOH Interventions Today    Flowsheet Row Most Recent Value  SDOH Interventions   Food Insecurity Interventions Intervention Not Indicated  Housing Interventions Intervention Not Indicated  Transportation Interventions Intervention Not Indicated  Utilities Interventions Intervention Not Indicated  Health Literacy Interventions Intervention Not Indicated        Care Coordination Interventions:  Yes, provided   Follow up plan: No further intervention required.   Encounter Outcome:  Patient Visit Completed   [name] Leriche, RN, MSN RN Care Manager [facility], Population Health Direct Dial: [phone]  Fax: [phone] Website: [name]

## 2023-08-13 NOTE — Telephone Encounter (Signed)
Pt called to make aware have not received new CPAP mask. Would like a call back.

## 2023-08-16 NOTE — Telephone Encounter (Signed)
I have sent another message/email to the management team to check on status for the patient.

## 2023-08-23 DIAGNOSIS — D8989 Other specified disorders involving the immune mechanism, not elsewhere classified: Secondary | ICD-10-CM | POA: Diagnosis not present

## 2023-08-23 DIAGNOSIS — H3500 Unspecified background retinopathy: Secondary | ICD-10-CM | POA: Diagnosis not present

## 2023-08-23 DIAGNOSIS — Z79899 Other long term (current) drug therapy: Secondary | ICD-10-CM | POA: Diagnosis not present

## 2023-08-24 DIAGNOSIS — I2510 Atherosclerotic heart disease of native coronary artery without angina pectoris: Secondary | ICD-10-CM | POA: Diagnosis not present

## 2023-08-24 DIAGNOSIS — R053 Chronic cough: Secondary | ICD-10-CM | POA: Diagnosis not present

## 2023-08-24 DIAGNOSIS — J9811 Atelectasis: Secondary | ICD-10-CM | POA: Diagnosis not present

## 2023-08-24 DIAGNOSIS — R918 Other nonspecific abnormal finding of lung field: Secondary | ICD-10-CM | POA: Diagnosis not present

## 2023-08-30 ENCOUNTER — Other Ambulatory Visit: Payer: Self-pay | Admitting: Adult Health

## 2023-08-30 ENCOUNTER — Other Ambulatory Visit: Payer: Self-pay | Admitting: Hematology

## 2023-08-30 DIAGNOSIS — C50412 Malignant neoplasm of upper-outer quadrant of left female breast: Secondary | ICD-10-CM

## 2023-08-30 DIAGNOSIS — G43009 Migraine without aura, not intractable, without status migrainosus: Secondary | ICD-10-CM

## 2023-08-31 ENCOUNTER — Encounter: Payer: Self-pay | Admitting: Hematology

## 2023-09-09 DIAGNOSIS — H4312 Vitreous hemorrhage, left eye: Secondary | ICD-10-CM | POA: Diagnosis not present

## 2023-09-09 DIAGNOSIS — Z961 Presence of intraocular lens: Secondary | ICD-10-CM | POA: Diagnosis not present

## 2023-09-09 DIAGNOSIS — G7000 Myasthenia gravis without (acute) exacerbation: Secondary | ICD-10-CM | POA: Diagnosis not present

## 2023-09-09 DIAGNOSIS — H469 Unspecified optic neuritis: Secondary | ICD-10-CM | POA: Diagnosis not present

## 2023-09-09 DIAGNOSIS — H3500 Unspecified background retinopathy: Secondary | ICD-10-CM | POA: Diagnosis not present

## 2023-09-09 DIAGNOSIS — E113312 Type 2 diabetes mellitus with moderate nonproliferative diabetic retinopathy with macular edema, left eye: Secondary | ICD-10-CM | POA: Diagnosis not present

## 2023-09-09 DIAGNOSIS — Z8669 Personal history of other diseases of the nervous system and sense organs: Secondary | ICD-10-CM | POA: Diagnosis not present

## 2023-09-15 ENCOUNTER — Telehealth: Payer: Self-pay | Admitting: Adult Health

## 2023-09-15 NOTE — Telephone Encounter (Signed)
Pt is asking for a call to discus the needed mask for her CPAP

## 2023-09-15 NOTE — Telephone Encounter (Signed)
Pt said she has been in contact with [employer] and they have the order. Pt said [employer] told her due to her insurance they [employer] is who can help her get supplies for new mask. Pt said she spoke with someone at [employer] and she was told they don't have the order for a new mask.

## 2023-09-16 NOTE — Telephone Encounter (Signed)
   I called patient and informed her of this as well.

## 2023-09-16 NOTE — Telephone Encounter (Signed)
Community message to [employer].

## 2023-09-17 DIAGNOSIS — R6889 Other general symptoms and signs: Secondary | ICD-10-CM | POA: Diagnosis not present

## 2023-09-17 DIAGNOSIS — R059 Cough, unspecified: Secondary | ICD-10-CM | POA: Diagnosis not present

## 2023-09-24 DIAGNOSIS — R059 Cough, unspecified: Secondary | ICD-10-CM | POA: Diagnosis not present

## 2023-10-04 ENCOUNTER — Other Ambulatory Visit: Payer: Self-pay | Admitting: Adult Health

## 2023-10-04 DIAGNOSIS — G43009 Migraine without aura, not intractable, without status migrainosus: Secondary | ICD-10-CM

## 2023-10-04 NOTE — Telephone Encounter (Signed)
 Last seen on 07/26/22 Follow up scheduled on 02/02/24

## 2023-10-16 ENCOUNTER — Encounter: Payer: Self-pay | Admitting: Adult Health

## 2023-10-29 ENCOUNTER — Emergency Department (HOSPITAL_COMMUNITY)

## 2023-10-29 ENCOUNTER — Inpatient Hospital Stay (HOSPITAL_BASED_OUTPATIENT_CLINIC_OR_DEPARTMENT_OTHER)
Admission: EM | Admit: 2023-10-29 | Discharge: 2023-11-02 | DRG: 065 | Disposition: A | Discharge status: Rehabilitation Facility | Attending: Internal Medicine | Admitting: Internal Medicine

## 2023-10-29 ENCOUNTER — Encounter (HOSPITAL_BASED_OUTPATIENT_CLINIC_OR_DEPARTMENT_OTHER): Payer: Self-pay

## 2023-10-29 ENCOUNTER — Emergency Department (HOSPITAL_BASED_OUTPATIENT_CLINIC_OR_DEPARTMENT_OTHER)

## 2023-10-29 ENCOUNTER — Other Ambulatory Visit: Payer: Self-pay

## 2023-10-29 DIAGNOSIS — D696 Thrombocytopenia, unspecified: Secondary | ICD-10-CM | POA: Diagnosis not present

## 2023-10-29 DIAGNOSIS — I6329 Cerebral infarction due to unspecified occlusion or stenosis of other precerebral arteries: Secondary | ICD-10-CM | POA: Diagnosis not present

## 2023-10-29 DIAGNOSIS — D72819 Decreased white blood cell count, unspecified: Secondary | ICD-10-CM | POA: Diagnosis not present

## 2023-10-29 DIAGNOSIS — Z8249 Family history of ischemic heart disease and other diseases of the circulatory system: Secondary | ICD-10-CM

## 2023-10-29 DIAGNOSIS — E872 Acidosis, unspecified: Secondary | ICD-10-CM | POA: Diagnosis present

## 2023-10-29 DIAGNOSIS — E890 Postprocedural hypothyroidism: Secondary | ICD-10-CM | POA: Diagnosis present

## 2023-10-29 DIAGNOSIS — G7000 Myasthenia gravis without (acute) exacerbation: Secondary | ICD-10-CM | POA: Diagnosis not present

## 2023-10-29 DIAGNOSIS — Z881 Allergy status to other antibiotic agents status: Secondary | ICD-10-CM

## 2023-10-29 DIAGNOSIS — K219 Gastro-esophageal reflux disease without esophagitis: Secondary | ICD-10-CM | POA: Diagnosis not present

## 2023-10-29 DIAGNOSIS — J329 Chronic sinusitis, unspecified: Secondary | ICD-10-CM | POA: Diagnosis not present

## 2023-10-29 DIAGNOSIS — R200 Anesthesia of skin: Secondary | ICD-10-CM | POA: Diagnosis not present

## 2023-10-29 DIAGNOSIS — Z823 Family history of stroke: Secondary | ICD-10-CM

## 2023-10-29 DIAGNOSIS — R42 Dizziness and giddiness: Principal | ICD-10-CM

## 2023-10-29 DIAGNOSIS — N182 Chronic kidney disease, stage 2 (mild): Secondary | ICD-10-CM | POA: Diagnosis present

## 2023-10-29 DIAGNOSIS — I739 Peripheral vascular disease, unspecified: Secondary | ICD-10-CM | POA: Diagnosis not present

## 2023-10-29 DIAGNOSIS — G4733 Obstructive sleep apnea (adult) (pediatric): Secondary | ICD-10-CM | POA: Diagnosis present

## 2023-10-29 DIAGNOSIS — E8809 Other disorders of plasma-protein metabolism, not elsewhere classified: Secondary | ICD-10-CM | POA: Diagnosis present

## 2023-10-29 DIAGNOSIS — I6350 Cerebral infarction due to unspecified occlusion or stenosis of unspecified cerebral artery: Secondary | ICD-10-CM

## 2023-10-29 DIAGNOSIS — I129 Hypertensive chronic kidney disease with stage 1 through stage 4 chronic kidney disease, or unspecified chronic kidney disease: Secondary | ICD-10-CM | POA: Diagnosis present

## 2023-10-29 DIAGNOSIS — J45909 Unspecified asthma, uncomplicated: Secondary | ICD-10-CM | POA: Diagnosis not present

## 2023-10-29 DIAGNOSIS — E119 Type 2 diabetes mellitus without complications: Secondary | ICD-10-CM

## 2023-10-29 DIAGNOSIS — E66812 Obesity, class 2: Secondary | ICD-10-CM | POA: Diagnosis present

## 2023-10-29 DIAGNOSIS — D649 Anemia, unspecified: Secondary | ICD-10-CM | POA: Diagnosis not present

## 2023-10-29 DIAGNOSIS — Z794 Long term (current) use of insulin: Secondary | ICD-10-CM | POA: Diagnosis not present

## 2023-10-29 DIAGNOSIS — E1165 Type 2 diabetes mellitus with hyperglycemia: Secondary | ICD-10-CM | POA: Diagnosis present

## 2023-10-29 DIAGNOSIS — Z79624 Long term (current) use of inhibitors of nucleotide synthesis: Secondary | ICD-10-CM

## 2023-10-29 DIAGNOSIS — I10 Essential (primary) hypertension: Secondary | ICD-10-CM | POA: Diagnosis present

## 2023-10-29 DIAGNOSIS — G8111 Spastic hemiplegia affecting right dominant side: Secondary | ICD-10-CM | POA: Diagnosis not present

## 2023-10-29 DIAGNOSIS — Z7952 Long term (current) use of systemic steroids: Secondary | ICD-10-CM

## 2023-10-29 DIAGNOSIS — G8191 Hemiplegia, unspecified affecting right dominant side: Secondary | ICD-10-CM | POA: Diagnosis not present

## 2023-10-29 DIAGNOSIS — E1122 Type 2 diabetes mellitus with diabetic chronic kidney disease: Secondary | ICD-10-CM | POA: Diagnosis not present

## 2023-10-29 DIAGNOSIS — R29818 Other symptoms and signs involving the nervous system: Secondary | ICD-10-CM | POA: Diagnosis not present

## 2023-10-29 DIAGNOSIS — Z91013 Allergy to seafood: Secondary | ICD-10-CM

## 2023-10-29 DIAGNOSIS — Z9641 Presence of insulin pump (external) (internal): Secondary | ICD-10-CM | POA: Diagnosis present

## 2023-10-29 DIAGNOSIS — E785 Hyperlipidemia, unspecified: Secondary | ICD-10-CM | POA: Diagnosis not present

## 2023-10-29 DIAGNOSIS — K5901 Slow transit constipation: Secondary | ICD-10-CM | POA: Diagnosis not present

## 2023-10-29 DIAGNOSIS — Z91048 Other nonmedicinal substance allergy status: Secondary | ICD-10-CM

## 2023-10-29 DIAGNOSIS — E7800 Pure hypercholesterolemia, unspecified: Secondary | ICD-10-CM | POA: Diagnosis present

## 2023-10-29 DIAGNOSIS — Z79899 Other long term (current) drug therapy: Secondary | ICD-10-CM

## 2023-10-29 DIAGNOSIS — Z7984 Long term (current) use of oral hypoglycemic drugs: Secondary | ICD-10-CM | POA: Diagnosis not present

## 2023-10-29 DIAGNOSIS — N179 Acute kidney failure, unspecified: Secondary | ICD-10-CM | POA: Diagnosis not present

## 2023-10-29 DIAGNOSIS — E876 Hypokalemia: Secondary | ICD-10-CM | POA: Diagnosis not present

## 2023-10-29 DIAGNOSIS — H9190 Unspecified hearing loss, unspecified ear: Secondary | ICD-10-CM | POA: Diagnosis present

## 2023-10-29 DIAGNOSIS — Z801 Family history of malignant neoplasm of trachea, bronchus and lung: Secondary | ICD-10-CM

## 2023-10-29 DIAGNOSIS — E1169 Type 2 diabetes mellitus with other specified complication: Secondary | ICD-10-CM | POA: Diagnosis present

## 2023-10-29 DIAGNOSIS — Z7902 Long term (current) use of antithrombotics/antiplatelets: Secondary | ICD-10-CM

## 2023-10-29 DIAGNOSIS — Z853 Personal history of malignant neoplasm of breast: Secondary | ICD-10-CM

## 2023-10-29 DIAGNOSIS — Z961 Presence of intraocular lens: Secondary | ICD-10-CM | POA: Diagnosis present

## 2023-10-29 DIAGNOSIS — R29702 NIHSS score 2: Secondary | ICD-10-CM | POA: Diagnosis present

## 2023-10-29 DIAGNOSIS — Z91041 Radiographic dye allergy status: Secondary | ICD-10-CM

## 2023-10-29 DIAGNOSIS — I639 Cerebral infarction, unspecified: Secondary | ICD-10-CM | POA: Diagnosis present

## 2023-10-29 DIAGNOSIS — I6521 Occlusion and stenosis of right carotid artery: Secondary | ICD-10-CM | POA: Diagnosis not present

## 2023-10-29 DIAGNOSIS — Z8673 Personal history of transient ischemic attack (TIA), and cerebral infarction without residual deficits: Secondary | ICD-10-CM | POA: Diagnosis not present

## 2023-10-29 DIAGNOSIS — R531 Weakness: Secondary | ICD-10-CM | POA: Diagnosis not present

## 2023-10-29 DIAGNOSIS — I6381 Other cerebral infarction due to occlusion or stenosis of small artery: Secondary | ICD-10-CM | POA: Diagnosis not present

## 2023-10-29 DIAGNOSIS — Z885 Allergy status to narcotic agent status: Secondary | ICD-10-CM

## 2023-10-29 DIAGNOSIS — Z888 Allergy status to other drugs, medicaments and biological substances status: Secondary | ICD-10-CM

## 2023-10-29 DIAGNOSIS — I6522 Occlusion and stenosis of left carotid artery: Secondary | ICD-10-CM | POA: Diagnosis not present

## 2023-10-29 DIAGNOSIS — Z825 Family history of asthma and other chronic lower respiratory diseases: Secondary | ICD-10-CM

## 2023-10-29 DIAGNOSIS — Z90710 Acquired absence of both cervix and uterus: Secondary | ICD-10-CM

## 2023-10-29 DIAGNOSIS — Z6835 Body mass index (BMI) 35.0-35.9, adult: Secondary | ICD-10-CM

## 2023-10-29 DIAGNOSIS — Z923 Personal history of irradiation: Secondary | ICD-10-CM

## 2023-10-29 DIAGNOSIS — Z79890 Hormone replacement therapy (postmenopausal): Secondary | ICD-10-CM

## 2023-10-29 DIAGNOSIS — Z85850 Personal history of malignant neoplasm of thyroid: Secondary | ICD-10-CM

## 2023-10-29 DIAGNOSIS — Z7982 Long term (current) use of aspirin: Secondary | ICD-10-CM

## 2023-10-29 DIAGNOSIS — I6523 Occlusion and stenosis of bilateral carotid arteries: Secondary | ICD-10-CM | POA: Diagnosis not present

## 2023-10-29 DIAGNOSIS — I6389 Other cerebral infarction: Secondary | ICD-10-CM | POA: Diagnosis not present

## 2023-10-29 DIAGNOSIS — Z833 Family history of diabetes mellitus: Secondary | ICD-10-CM

## 2023-10-29 LAB — DIFFERENTIAL
Abs Immature Granulocytes: 0.03 10*3/uL (ref 0.00–0.07)
Basophils Absolute: 0 10*3/uL (ref 0.0–0.1)
Basophils Relative: 0 %
Eosinophils Absolute: 0.1 10*3/uL (ref 0.0–0.5)
Eosinophils Relative: 2 %
Immature Granulocytes: 1 %
Lymphocytes Relative: 12 %
Lymphs Abs: 0.7 10*3/uL (ref 0.7–4.0)
Monocytes Absolute: 0.5 10*3/uL (ref 0.1–1.0)
Monocytes Relative: 9 %
Neutro Abs: 4.8 10*3/uL (ref 1.7–7.7)
Neutrophils Relative %: 76 %

## 2023-10-29 LAB — COMPREHENSIVE METABOLIC PANEL
ALT: 22 U/L (ref 0–44)
AST: 25 U/L (ref 15–41)
Albumin: 3.2 g/dL — ABNORMAL LOW (ref 3.5–5.0)
Alkaline Phosphatase: 84 U/L (ref 38–126)
Anion gap: 8 (ref 5–15)
BUN: 18 mg/dL (ref 8–23)
CO2: 21 mmol/L — ABNORMAL LOW (ref 22–32)
Calcium: 8.8 mg/dL — ABNORMAL LOW (ref 8.9–10.3)
Chloride: 112 mmol/L — ABNORMAL HIGH (ref 98–111)
Creatinine, Ser: 0.89 mg/dL (ref 0.44–1.00)
GFR, Estimated: >60 mL/min (ref >60)
Glucose, Bld: 196 mg/dL — ABNORMAL HIGH (ref 70–99)
Potassium: 3.3 mmol/L — ABNORMAL LOW (ref 3.5–5.1)
Sodium: 141 mmol/L (ref 135–145)
Total Bilirubin: 0.5 mg/dL (ref 0.0–1.2)
Total Protein: 7.6 g/dL (ref 6.5–8.1)

## 2023-10-29 LAB — PROTIME-INR
INR: 1 (ref 0.8–1.2)
Prothrombin Time: 13.4 s (ref 11.4–15.2)

## 2023-10-29 LAB — HEMOGLOBIN A1C
Hgb A1c MFr Bld: 9.2 % — ABNORMAL HIGH (ref 4.8–5.6)
Mean Plasma Glucose: 217.34 mg/dL

## 2023-10-29 LAB — CBC
HCT: 34.4 % — ABNORMAL LOW (ref 36.0–46.0)
Hemoglobin: 10.8 g/dL — ABNORMAL LOW (ref 12.0–15.0)
MCH: 29.3 pg (ref 26.0–34.0)
MCHC: 31.4 g/dL (ref 30.0–36.0)
MCV: 93.5 fL (ref 80.0–100.0)
Platelets: 166 10*3/uL (ref 150–400)
RBC: 3.68 MIL/uL — ABNORMAL LOW (ref 3.87–5.11)
RDW: 12.6 % (ref 11.5–15.5)
WBC: 6.2 10*3/uL (ref 4.0–10.5)
nRBC: 0 % (ref 0.0–0.2)

## 2023-10-29 LAB — APTT: aPTT: 24 s (ref 24–36)

## 2023-10-29 LAB — ETHANOL: Alcohol, Ethyl (B): <10 mg/dL (ref <10)

## 2023-10-29 LAB — HIV ANTIBODY (ROUTINE TESTING W REFLEX): HIV Screen 4th Generation wRfx: NONREACTIVE

## 2023-10-29 LAB — CBG MONITORING, ED: Glucose-Capillary: 314 mg/dL — ABNORMAL HIGH (ref 70–99)

## 2023-10-29 MED ORDER — ACETAMINOPHEN 325 MG PO TABS
650.0000 mg | ORAL_TABLET | Freq: Four times a day (QID) | ORAL | Status: DC | PRN
  Administered 2023-10-30 – 2023-11-02 (×5): 650 mg via ORAL
  Filled 2023-10-29 (×5): qty 2

## 2023-10-29 MED ORDER — CLOPIDOGREL BISULFATE 300 MG PO TABS
300.0000 mg | ORAL_TABLET | ORAL | Status: AC
  Administered 2023-10-29: 300 mg via ORAL
  Filled 2023-10-29: qty 1

## 2023-10-29 MED ORDER — ALBUTEROL SULFATE (2.5 MG/3ML) 0.083% IN NEBU: 3.0000 mL | INHALATION_SOLUTION | Freq: Four times a day (QID) | RESPIRATORY_TRACT | Status: DC | PRN

## 2023-10-29 MED ORDER — VITAMIN B-12 1000 MCG PO TABS
1000.0000 ug | ORAL_TABLET | Freq: Every day | ORAL | Status: DC
Start: 2023-10-29 — End: 2023-11-02
  Administered 2023-10-29 – 2023-11-02 (×5): 1000 ug via ORAL
  Filled 2023-10-29 (×5): qty 1

## 2023-10-29 MED ORDER — ASPIRIN 81 MG PO TBEC
81.0000 mg | DELAYED_RELEASE_TABLET | Freq: Every day | ORAL | Status: DC
  Administered 2023-10-31 – 2023-11-02 (×3): 81 mg via ORAL
  Filled 2023-10-29 (×4): qty 1

## 2023-10-29 MED ORDER — INSULIN GLARGINE 100 UNIT/ML ~~LOC~~ SOLN
15.0000 [IU] | Freq: Every day | SUBCUTANEOUS | Status: DC
  Administered 2023-10-29: 15 [IU] via SUBCUTANEOUS
  Filled 2023-10-29 (×2): qty 0.15

## 2023-10-29 MED ORDER — ACETAMINOPHEN 650 MG RE SUPP: 650.0000 mg | Freq: Four times a day (QID) | RECTAL | Status: DC | PRN

## 2023-10-29 MED ORDER — CLOPIDOGREL BISULFATE 75 MG PO TABS
75.0000 mg | ORAL_TABLET | Freq: Every day | ORAL | Status: DC
  Administered 2023-10-30 – 2023-11-02 (×4): 75 mg via ORAL
  Filled 2023-10-29 (×5): qty 1

## 2023-10-29 MED ORDER — ASPIRIN 81 MG PO TBEC: 81.0000 mg | DELAYED_RELEASE_TABLET | Freq: Every day | ORAL | Status: DC

## 2023-10-29 MED ORDER — MYCOPHENOLATE MOFETIL 250 MG PO CAPS
1000.0000 mg | ORAL_CAPSULE | Freq: Two times a day (BID) | ORAL | Status: DC
  Administered 2023-10-29 – 2023-11-02 (×8): 1000 mg via ORAL
  Filled 2023-10-29 (×8): qty 4

## 2023-10-29 MED ORDER — ROSUVASTATIN CALCIUM 20 MG PO TABS
40.0000 mg | ORAL_TABLET | Freq: Every day | ORAL | Status: DC
  Administered 2023-10-30 – 2023-11-02 (×4): 40 mg via ORAL
  Filled 2023-10-29 (×4): qty 2

## 2023-10-29 MED ORDER — PANTOPRAZOLE SODIUM 40 MG PO TBEC
40.0000 mg | DELAYED_RELEASE_TABLET | Freq: Every day | ORAL | Status: DC
  Administered 2023-10-30 – 2023-11-02 (×4): 40 mg via ORAL
  Filled 2023-10-29 (×4): qty 1

## 2023-10-29 MED ORDER — PROCHLORPERAZINE EDISYLATE 10 MG/2ML IJ SOLN
10.0000 mg | Freq: Once | INTRAMUSCULAR | Status: AC
  Administered 2023-10-29: 10 mg via INTRAVENOUS
  Filled 2023-10-29: qty 2

## 2023-10-29 MED ORDER — POTASSIUM CHLORIDE CRYS ER 20 MEQ PO TBCR
40.0000 meq | EXTENDED_RELEASE_TABLET | Freq: Once | ORAL | Status: AC
  Administered 2023-10-29: 40 meq via ORAL
  Filled 2023-10-29: qty 2

## 2023-10-29 MED ORDER — LEVOTHYROXINE SODIUM 100 MCG PO TABS
200.0000 ug | ORAL_TABLET | Freq: Every day | ORAL | Status: DC
  Administered 2023-10-30 – 2023-11-01 (×3): 200 ug via ORAL
  Filled 2023-10-29 (×3): qty 2

## 2023-10-29 MED ORDER — ENOXAPARIN SODIUM 40 MG/0.4ML IJ SOSY
40.0000 mg | PREFILLED_SYRINGE | INTRAMUSCULAR | Status: DC
  Administered 2023-10-29 – 2023-11-01 (×4): 40 mg via SUBCUTANEOUS
  Filled 2023-10-29 (×4): qty 0.4

## 2023-10-29 MED ORDER — PREGABALIN 25 MG PO CAPS
25.0000 mg | ORAL_CAPSULE | Freq: Two times a day (BID) | ORAL | Status: DC
  Administered 2023-10-29 – 2023-11-02 (×8): 25 mg via ORAL
  Filled 2023-10-29 (×8): qty 1

## 2023-10-29 MED ORDER — EZETIMIBE 10 MG PO TABS
10.0000 mg | ORAL_TABLET | Freq: Every day | ORAL | Status: DC
  Administered 2023-10-30 – 2023-11-02 (×4): 10 mg via ORAL
  Filled 2023-10-29 (×4): qty 1

## 2023-10-29 MED ORDER — INSULIN ASPART 100 UNIT/ML IJ SOLN
0.0000 [IU] | Freq: Three times a day (TID) | INTRAMUSCULAR | Status: DC
  Administered 2023-10-30: 11 [IU] via SUBCUTANEOUS
  Administered 2023-10-30: 8 [IU] via SUBCUTANEOUS
  Administered 2023-10-30: 11 [IU] via SUBCUTANEOUS

## 2023-10-29 MED ORDER — MYCOPHENOLATE MOFETIL 500 MG PO TABS: 1000.0000 mg | ORAL_TABLET | Freq: Two times a day (BID) | ORAL | Status: DC

## 2023-10-29 NOTE — H&P (Signed)
 History and Physical    Patient: [name]:[id number] DOB: 11-Jun-1951 DOA: 10/29/2023 DOS: the patient was seen and examined on 10/29/2023 PCP: Irena Reichmann, DO  Patient coming from: Home  Chief Complaint:  Chief Complaint  Patient presents with   Numbness   HPI: [name] is a 73 y.o. female with medical history significant of CVA, insulin-dependent type 2 diabetes, hypertension, hyperlipidemia, myasthenia gravis, OSA, GERD, history of thyroid cancer status post radiation and total thyroidectomy, history of breast cancer status postlumpectomy and adjuvant chemotherapy and radiation therapy presenting to the ED with right-sided weakness and numbness.  Patient reports that she began having right-sided weakness and numbness upon awakening this morning.  Her last known well is last night prior to going to sleep.  She does report that she experienced vertigo this past Friday and was evaluated for it in the outpatient setting.  Her vertigo resolved with meclizine and scopolamine patch.  She reports a history of stroke in the past that did result in right-sided weakness and numbness however this did since resolved until recurrence this morning.  She was prompted to come to the ED for further evaluation given that she redeveloped the symptoms.  She denies any fevers, chills, nausea, vomiting, chest pain, palpitations, shortness of breath, abdominal pain, urinary changes.  Denies any current vertigo or lightheadedness.  ED course: Vital signs stable aside from elevated blood pressure to 180s/70s.  CBC showing mild normocytic anemia, otherwise unremarkable.  CMP showing mild hypokalemia to 3.3, hyperglycemia to 196, mild hypoalbuminemia, otherwise unremarkable.  Alcohol level negative.  PT/INR and APTT normal.  CT head without any acute intracranial abnormalities.  MRI brain revealed a 5 mm acute infarct within the dorsal pons on the right.  Neurology consulted.  MRA performed but results  pending.  [facility] hospitalist asked to evaluate patient for admission.  Review of Systems: As mentioned in the history of present illness. All other systems reviewed and are negative. Past Medical History:  Diagnosis Date   Arthritis    "all over my body" (03/13/2013)   Asthma    Asymptomatic carotid artery stenosis, bilateral 10/08/2018   Breast cancer (HCC)    GERD (gastroesophageal reflux disease)    H/O hiatal hernia    Headache    pt states she has had headaches for about 6 months "off and on"   Heart murmur    pt had echocardiogram on 09/17/21   Hypercholesterolemia 10/08/2018   Hypertension    Hypothyroidism    Myasthenia gravis (HCC)    "in my eyes; diagnsosed > 7 yr ago" (03/13/2013)   Sleep apnea    on CPAP   Stroke (HCC) 2021   Thyroid carcinoma (HCC)    Type II diabetes mellitus (HCC)    Past Surgical History:  Procedure Laterality Date   ABDOMINAL HYSTERECTOMY     ANTERIOR CERVICAL DECOMP/DISCECTOMY FUSION     "I've had severa ORs; always went in from the front" (03/13/2013)   APPENDECTOMY     BREAST LUMPECTOMY WITH RADIOACTIVE SEED AND SENTINEL LYMPH NODE BIOPSY Left 09/02/2022   Procedure: LEFT BREAST LUMPECTOMY WITH RADIOACTIVE SEED AND SENTINEL LYMPH NODE BIOPSY;  Surgeon: Abigail Miyamoto, MD;  Location: [facility] OR;  Service: General;  Laterality: Left;   CARDIAC CATHETERIZATION     "several" (03/13/2013)   CARPAL TUNNEL RELEASE Right    CATARACT EXTRACTION W/ INTRAOCULAR LENS  IMPLANT, BILATERAL Bilateral    CHOLECYSTECTOMY     KNEE ARTHROSCOPY Left    SHOULDER  ARTHROSCOPY W/ ROTATOR CUFF REPAIR Left    TONSILLECTOMY     TOTAL THYROIDECTOMY     TRANSCAROTID ARTERY REVASCULARIZATION  Left 09/24/2021   Procedure: LEFT TRANSCAROTID ARTERY REVASCULARIZATION;  Surgeon: Leonie Douglas, MD;  Location: [facility] OR;  Service: Vascular;  Laterality: Left;   TRANSCAROTID ARTERY REVASCULARIZATION  Right 10/23/2021   Procedure: Right Transcarotid Artery Revascularization;   Surgeon: Leonie Douglas, MD;  Location: [facility] OR;  Service: Vascular;  Laterality: Right;   ULTRASOUND GUIDANCE FOR VASCULAR ACCESS Right 09/24/2021   Procedure: ULTRASOUND GUIDANCE FOR VASCULAR ACCESS;  Surgeon: Leonie Douglas, MD;  Location: [facility] OR;  Service: Vascular;  Laterality: Right;   ULTRASOUND GUIDANCE FOR VASCULAR ACCESS Right 10/23/2021   Procedure: ULTRASOUND GUIDANCE FOR VASCULAR ACCESS, LEFT FEMORAL VEIN;  Surgeon: Leonie Douglas, MD;  Location: [facility] OR;  Service: Vascular;  Laterality: Right;   Social History:  reports that she has never smoked. She has never used smokeless tobacco. She reports that she does not drink alcohol and does not use drugs.  Allergies  Allergen Reactions   Contrast Media [Iodinated Contrast Media] Anaphylaxis    01/10/15 and 09/18/2021 --PT GIVEN 13 HR PRE MEDS FOR CT with contrast TOLERATED IV CONTRAST W/O ANY REACTION   Fluorescein Shortness Of Breath and Other (See Comments)    (Dye)   Iodine Anaphylaxis    Contrast dye - iodine   Molds & Smuts Shortness Of Breath and Other (See Comments)    Congestion and wheezing, also   Shellfish-Derived Products Anaphylaxis, Shortness Of Breath, Swelling and Other (See Comments)    Welts, also   Pholcodine Hives   Azithromycin Nausea Only   Codeine Hives and Nausea Only   Metformin Hcl Er Nausea Only   Oxycodone Nausea Only    Nausea 08/2022 admission.   Tramadol Hcl Nausea Only   Other Rash and Other (See Comments)    Coban causes welts, also    Family History  Problem Relation Age of Onset   Coronary artery disease Sister        s/p coronary stenting   Stroke Sister 27   Diabetes Sister    Congestive Heart Failure Mother    Asthma Mother    Diabetes Mother    Congestive Heart Failure Father    Lung cancer Father    Asthma Father    Diabetes Father    Diabetes Brother     Prior to Admission medications   Medication Sig Start Date End Date Taking? Authorizing Provider  acetaminophen  (TYLENOL) 500 MG tablet Take 500 mg by mouth every 6 (six) hours as needed for moderate pain.    [provider]  aspirin EC 81 MG EC tablet Take 1 tablet (81 mg total) by mouth daily. 12/28/19   Dorcas Carrow, MD  Biotin 5000 MCG CAPS Take 5,000 mcg by mouth daily with breakfast.     [provider]  cycloSPORINE (RESTASIS) 0.05 % ophthalmic emulsion Place 1 drop into both eyes 2 (two) times daily. 01/08/20   Angiulli, Mcarthur Rossetti, PA-C  diclofenac Sodium (VOLTAREN) 1 % GEL Apply 2 g topically 4 (four) times daily. Patient taking differently: Apply 1 application  topically daily as needed (pain). 01/08/20   Angiulli, Mcarthur Rossetti, PA-C  diphenhydrAMINE 25 mg in sodium chloride 0.9 % 50 mL Inject 25 mg into the vein See admin instructions. Administer 25 mg intravenously at start of Gamunex infusion, then administer 25 mg during infusion    [provider]  diphenoxylate-atropine (LOMOTIL) 2.5-0.025 MG tablet Take 1-2 tablets by mouth 4 (four) times daily as needed for diarrhea or loose stools. 10/28/22   Pollyann Samples, NP  ELDERBERRY PO Take 1 tablet by mouth daily.    [provider]  empagliflozin (JARDIANCE) 25 MG TABS tablet Take 25 mg by mouth daily.    [provider]  esomeprazole (NEXIUM) 40 MG capsule Take 1 capsule (40 mg total) by mouth 2 (two) times daily. 01/08/20   Angiulli, Mcarthur Rossetti, PA-C  Evolocumab with Infusor (REPATHA PUSHTRONEX SYSTEM) 420 MG/3.5ML SOCT Inject 420 mg into the skin every 30 (thirty) days.    [provider]  Exenatide ER (BYDUREON BCISE) 2 MG/0.85ML AUIJ Inject 2 mg into the skin every Monday.    [provider]  ezetimibe (ZETIA) 10 MG tablet TAKE 1 TABLET BY MOUTH EVERY DAY 01/16/21   Yates Decamp, MD  furosemide (LASIX) 40 MG tablet Take 40 mg by mouth daily as needed for edema.    [provider]  Immune Globulin, Human, 40 GM/400ML SOLN Inject into the vein every 6 (six) weeks. Infusions are administered  at home by home health nurse - last infusion 09/08/21 and 09/09/21; next infusion due 10/19/21 and 10/20/21 02/21/20   [provider]  insulin degludec (TRESIBA FLEXTOUCH) 100 UNIT/ML FlexTouch Pen Inject 15 Units into the skin in the morning.    [provider]  Insulin Disposable Pump (OMNIPOD DASH 5 PACK PODS) MISC Use with Humalog 01/10/20   [provider]  insulin lispro (HUMALOG) 100 UNIT/ML injection Inject into the skin See admin instructions. Inject subcutaneously 3 (three) times daily with meals With meals through the Omnipod Dash Pump every 72 hours as directed    [provider]  Levothyroxine Sodium 200 MCG CAPS Take 200 mcg by mouth daily before breakfast.    [provider]  lidocaine-prilocaine (EMLA) cream Apply to affected area once 10/15/22   Malachy Mood, MD  loperamide (IMODIUM A-D) 2 MG tablet Take 2 mg by mouth 4 (four) times daily as needed for diarrhea or loose stools.    [provider]  loratadine (CLARITIN) 10 MG tablet Take 1 tablet (10 mg total) by mouth at bedtime. 01/08/20   Angiulli, Mcarthur Rossetti, PA-C  meclizine (ANTIVERT) 25 MG tablet Take 25 mg by mouth 3 (three) times daily as needed for dizziness or nausea (or migraine-related nausea). 06/27/13   [provider]  montelukast (SINGULAIR) 10 MG tablet Take 1 tablet (10 mg total) by mouth every morning. 01/08/20   Angiulli, Mcarthur Rossetti, PA-C  mycophenolate (CELLCEPT) 500 MG tablet Take 1,000 mg by mouth 2 (two) times daily. For myasthenia gravis 09/04/21   [provider]  olmesartan (BENICAR) 20 MG tablet Take 1 tablet (20 mg total) by mouth daily. 01/08/20   Angiulli, Mcarthur Rossetti, PA-C  ondansetron (ZOFRAN) 8 MG tablet TAKE 1 TABLET BY MOUTH EVERY 8  HOURS AS NEEDED FOR NAUSEA AND  VOMITING 08/31/23   Carlean Jews, NP  predniSONE (DELTASONE) 2.5 MG tablet Take 7.5 mg by mouth every morning. For myasthenia gravis    [provider]  pregabalin (LYRICA) 25  MG capsule Take 1 capsule (25 mg total) by mouth 2 (two) times daily. 07/28/23   Pollyann Samples, NP  PRESCRIPTION MEDICATION Pt uses a cpap nightly    [provider]  PROAIR HFA 108 (90 BASE) MCG/ACT inhaler Inhale 2 puffs into the lungs every 6 (six) hours as needed for  wheezing or shortness of breath.  06/02/13   [provider]  prochlorperazine (COMPAZINE) 10 MG tablet Take 1 tablet (10 mg total) by mouth every 6 (six) hours as needed for nausea or vomiting. 10/07/22   Malachy Mood, MD  riTUXimab (RITUXAN) 500 MG/50ML injection Inject into the vein every 6 (six) months.    [provider]  rosuvastatin (CRESTOR) 40 MG tablet Take 1 tablet (40 mg total) by mouth daily. 02/13/21 10/15/22  Yates Decamp, MD  tamoxifen (NOLVADEX) 20 MG tablet Take 1 tablet (20 mg total) by mouth daily. 06/07/23   Malachy Mood, MD  topiramate (TOPAMAX) 50 MG tablet TAKE 1 TABLET BY MOUTH AT  BEDTIME 10/04/23   Ihor Austin, NP  traMADol (ULTRAM) 50 MG tablet Take 1 tablet (50 mg total) by mouth every 6 (six) hours as needed for moderate pain. 09/03/22   Abigail Miyamoto, MD  vitamin B-12 (CYANOCOBALAMIN) 1000 MCG tablet Take 1,000 mcg by mouth daily.    [provider]  Vitamin D, Ergocalciferol, (DRISDOL) 50000 UNITS CAPS capsule Take 50,000 Units by mouth every Monday.  12/12/13   [provider]    Physical Exam: Vitals:   10/29/23 1145 10/29/23 1158 10/29/23 1358 10/29/23 1735  BP:   (!) 179/62 (!) 135/58  Pulse: 97  99 98  Resp: (!) 23  20 16   Temp:   98.1 F (36.7 C) 97.6 F (36.4 C)  TempSrc:   Oral Oral  SpO2: 100%  100% 100%  Weight:  90.5 kg     Physical Exam Constitutional:      Appearance: Normal appearance. She is obese. She is not ill-appearing.  HENT:     Head: Normocephalic and atraumatic.     Mouth/Throat:     Mouth: Mucous membranes are moist.     Pharynx: Oropharynx is clear. No oropharyngeal exudate.  Eyes:     General: No scleral icterus.     Extraocular Movements: Extraocular movements intact.     Conjunctiva/sclera: Conjunctivae normal.     Pupils: Pupils are equal, round, and reactive to light.  Cardiovascular:     Rate and Rhythm: Normal rate and regular rhythm.     Heart sounds: Normal heart sounds. No murmur heard.    No friction rub. No gallop.  Pulmonary:     Effort: Pulmonary effort is normal.     Breath sounds: Normal breath sounds. No wheezing, rhonchi or rales.  Abdominal:     General: Bowel sounds are normal. There is no distension.     Palpations: Abdomen is soft.     Tenderness: There is no abdominal tenderness. There is no guarding or rebound.  Musculoskeletal:        General: No swelling. Normal range of motion.     Cervical back: Normal range of motion.  Skin:    General: Skin is warm and dry.  Neurological:     Mental Status: She is alert and oriented to person, place, and time.     Comments: AAOx3. No slurred speech or aphasia noted. PERRL, EOMI, no nystagmus noted. Visual fields full bilaterally. Facial muscles intact bilaterally. Facial sensation diminished on right side. Shoulder shrug weaker on right side. Tongue protrusion midline. Uvula midline. Strength 5/5 in LUE and LLE, 4/5 in RUE and RLE. Sensation diminished in RUE and RLE. FNF intact bilaterally though sluggish on right side. Gait testing deferred.  Psychiatric:        Mood and Affect: Mood normal.  Behavior: Behavior normal.     Data Reviewed:  Results are pending, will review when available.    Latest Ref Rng & Units 10/29/2023   11:55 AM 05/04/2023    9:15 AM 03/11/2023   12:55 PM  CBC  WBC 4.0 - 10.5 K/uL 6.2  5.8  8.6   Hemoglobin 12.0 - 15.0 g/dL 09.8  11.9  9.3   Hematocrit 36.0 - 46.0 % 34.4  31.7  28.5   Platelets 150 - 400 K/uL 166  208  268       Latest Ref Rng & Units 10/29/2023   11:55 AM 05/04/2023    9:15 AM 03/11/2023   12:55 PM  CMP  Glucose 70 - 99 mg/dL 147  829  562   BUN 8 - 23 mg/dL 18  33  29    Creatinine 0.44 - 1.00 mg/dL 1.30  8.65  7.84   Sodium 135 - 145 mmol/L 141  140  141   Potassium 3.5 - 5.1 mmol/L 3.3  4.2  4.3   Chloride 98 - 111 mmol/L 112  110  111   CO2 22 - 32 mmol/L 21  24  22    Calcium 8.9 - 10.3 mg/dL 8.8  9.1  9.2   Total Protein 6.5 - 8.1 g/dL 7.6  7.4  6.9   Total Bilirubin 0.0 - 1.2 mg/dL 0.5  0.3  0.4   Alkaline Phos 38 - 126 U/L 84  160  165   AST 15 - 41 U/L 25  33  25   ALT 0 - 44 U/L 22  41  27    Alcohol Level    Component Value Date/Time   ETH <10 10/29/2023 1155   MR BRAIN WO CONTRAST Result Date: 10/29/2023 CLINICAL DATA:  Provided history: Neuro deficit, acute, stroke suspected. EXAM: MRI HEAD WITHOUT CONTRAST TECHNIQUE: Multiplanar, multiecho pulse sequences of the brain and surrounding structures were obtained without intravenous contrast. COMPARISON:  Head CT 10/29/2023.  Brain MRI 09/17/2021. FINDINGS: Brain: No age-advanced or lobar predominant cerebral atrophy. 5 mm acute infarct within the dorsal pons on the right. Chronic lacunar infarcts and chronic small ischemic changes elsewhere within the pons, new from the prior brain MRI of 09/17/2021. Chronic lacunar infarcts within the bilateral thalami. Few nonspecific punctate chronic microhemorrhages scattered within the supratentorial brain. No evidence of an intracranial mass. No extra-axial fluid collection. No midline shift. Vascular: Maintained flow voids within the proximal large arterial vessels. Skull and upper cervical spine: No focal worrisome marrow lesion. Incompletely assessed cervical spondylosis. Susceptibility artifact arising from ACDF hardware. Sinuses/Orbits: No mass or acute finding within the imaged orbits. Prior bilateral ocular lens replacement. Mild mucosal thickening within the right maxillary sinus. T1 hyperintense signal throughout much of the left sphenoid sinus which may reflect the presence of inspissated secretions, a mucous retention cyst or a polyp. IMPRESSION: 1. 5 mm  acute infarct within the dorsal pons on the right. 2. Chronic lacunar infarcts and chronic small ischemic changes elsewhere within the pons, new from the prior brain MRI of 09/17/2021. 3. Chronic lacunar infarcts within the bilateral thalami, also new from the prior MRI. 4. Few nonspecific punctate chronic microhemorrhages within the supratentorial brain. 5. Mild paranasal sinus disease as described. Electronically Signed   By: Jackey Loge D.O.   On: 10/29/2023 17:58   CT HEAD CODE STROKE WO CONTRAST Result Date: 10/29/2023 CLINICAL DATA:  Code stroke. Neuro deficit, acute, stroke suspected. Dizziness for the last week. Acute onset  of left-sided face, upper and lower extremity numbness beginning at 8:30 a.m. today. EXAM: CT HEAD WITHOUT CONTRAST TECHNIQUE: Contiguous axial images were obtained from the base of the skull through the vertex without intravenous contrast. RADIATION DOSE REDUCTION: This exam was performed according to the departmental dose-optimization program which includes automated exposure control, adjustment of the mA and/or kV according to patient size and/or use of iterative reconstruction technique. COMPARISON:  CT head without contrast 09/18/2021. FINDINGS: Brain: No acute infarct, hemorrhage, or mass lesion is present. Mild white matter changes demonstrates some progression. Deep brain nuclei are within normal limits. The ventricles are of normal size. Insular ribbon is normal. No acute or focal cortical abnormality is present. No significant extraaxial fluid collection is present. Midline structures are within normal limits. The brainstem and cerebellum are within normal limits. Vascular: Atherosclerotic calcifications are present within the cavernous ICA bilaterally. No hyperdense vessel is present. Skull: Calvarium is intact. No focal lytic or blastic lesions are present. No significant extracranial soft tissue lesion is present. Sinuses/Orbits: Chronic left sphenoid sinus disease is  present. The paranasal sinuses and mastoid air cells are otherwise clear. Bilateral lens replacements are noted. Globes and orbits are otherwise unremarkable. ASPECTS [facility] Stroke Program Early CT Score) - Ganglionic level infarction (caudate, lentiform nuclei, internal capsule, insula, M1-M3 cortex): 7/7 - Supraganglionic infarction (M4-M6 cortex): 3/3 Total score (0-10 with 10 being normal): 10/10 pelvic IMPRESSION: 1. No acute intracranial abnormality or significant interval change. 2. Mild white matter changes demonstrate some progression. This likely reflects the sequela of chronic microvascular ischemia. 3. Aspects is 10/10. 4. Chronic left sphenoid sinus disease. These results were called by telephone at the time of interpretation on 10/29/2023 at 12:06 pm to provider MELANIE BELFI , who verbally acknowledged these results. The above was relayed via text pager to Dr. Wilford Corner on 10/29/2023 at 12:06 . Electronically Signed   By: Marin Roberts M.D.   On: 10/29/2023 12:08     Assessment and Plan: No notes have been filed under this hospital service. 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  She does have a history of prior strokes as well.  Her last known well was last night prior to bed given that she awakened with presenting symptoms.  She is outside of the TNK window.  She is not on any anticoagulation at home though does take baby aspirin daily.  CT head was normal but MRI brain revealed acute infarct.  Neurology consulted.  MRA head and neck performed, awaiting results.  She did pass a bedside swallow and was able to tolerate dinner without any issues prior to my encounter.  She has already received a Plavix load, will start DAPT.  Of note, patient's current stroke occurred while on aspirin  monotherapy. -Neurology following, appreciate assistance -Follow-up MRA head and neck, echocardiogram -Follow-up hemoglobin A1c, lipid panel -Status post Plavix load -Start Plavix 75 mg daily and continue aspirin 81 mg daily -Lovenox for DVT prophylaxis -Carb modified diet -Telemetry -PT/OT eval -Place in observation  Hypokalemia -Potassium 3.3 on admission -Provided oral supplementation -Check potassium tomorrow morning  Insulin-dependent type 2 diabetes with hyperglycemia Patient taking Jardiance 25 mg daily and is on an insulin pump with a Dexcom sensor for outpatient management.  Blood glucose 196 on CMP today.  Hemoglobin A1c 9.2%.  Dexcom sensor removed for x-ray today.  Have advised patient to turn off insulin pump while inpatient until evaluation by diabetes coordinator.  Will start on basal insulin and SSI for now. -Lantus 15 units nightly -Moderate SSI -Trend CBGs, goal 140-180 -Consult to diabetes coordinator for management of insulin pump -Resumed home Lyrica 25 mg twice daily  Hypertension Patient taking olmesartan 20mg  daily and jardiance 25mg  daily at home. BP ranging in 130s-180s/50s-70s since arrival to ED. Holding home antihypertensives to allow for permissive HTN. -holding home antihypertensives -permissive HTN with goal BP <220/120 for first 48 hrs -trend BP curve, can add prn IV antihypertensives if BP above goal  Chronic normocytic anemia History of B12 deficiency -Baseline hemoglobin around 9-10, on admission hemoglobin 10.8 -Follow-up folate and B12 levels -Continue home B12 supplementation -Trend hemoglobin curve  Hyperlipidemia -Continue home Crestor 40 mg daily, Zetia 10 mg daily -Holding home Repatha -Follow-up lipid panel tomorrow morning  Myasthenia gravis in remission -Continue home CellCept 500 mg twice daily -follows at [facility] with Dr. 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  She was treated with lumpectomy, adjuvant chemotherapy, radiation therapy, antiestrogen therapy with tamoxifen beginning in 05/2023. -Stable -Follows oncology in the outpatient setting    Advance Care Planning:   Code Status: Full Code   Consults: neurology  Family Communication: updated family at bedside  Severity of Illness: The appropriate patient status for this patient is OBSERVATION. Observation status is judged to be reasonable and necessary in order to provide the required intensity of service to ensure the patient's safety. The patient's presenting symptoms, physical exam findings, and initial radiographic and laboratory data in the context of their medical condition is felt to place them at decreased risk for further clinical deterioration. Furthermore, it is anticipated that the patient will be medically stable for discharge from the hospital within 2 midnights of admission.   Portions of this note were generated with Scientist, clinical (histocompatibility and immunogenetics). Dictation errors may occur despite best attempts at proofreading.   Author: Briscoe Burns, MD 10/29/2023 9:04 PM  For on call review [email or web address].

## 2023-10-29 NOTE — Progress Notes (Signed)
 MRI is positive for small punctate brainstem stroke.  She will need admission for secondary risk factor modification and therapy evaluations.  She appears to be on aspirin monotherapy, and so I will load with Plavix 300 mg, followed by 75 mg daily in addition to 81 mg of aspirin.  She will need lipids, A1c, telemetry, echo, MRA head and neck.  [name] Slot, MD [facility]   If 7pm- 7am, please page neurology on call as listed in AMION.

## 2023-10-29 NOTE — ED Provider Notes (Addendum)
 [place] [facility] Provider Note   CSN: [id number] Arrival date & time: 10/29/23  1126  An emergency department physician performed an initial assessment on this suspected stroke patient at 1145.  History  Chief Complaint  Patient presents with   Numbness    NEVAEHA FINERTY is a 73 y.o. female.  Patient is a 73 year old female with a history of hypertension, hypothyroidism, diabetes, myasthenia gravis, hyperlipidemia and breast cancer who presents with right side numbness.  She states 1 week ago she had onset of dizziness.  It happened while she was sitting in a chair.  She describes a spinning sensation whether her eyes are open or close.  She has had associated nausea and vomiting.  She contacted her PCP who prescribed meclizine and scopolamine patches.  She says they have not really been helping.  She has ongoing dizziness.  She woke up this morning with numbness in the right side of her face and in her right arm and right leg.  She does not report any obvious weakness but she says it feels heavier on that side.  She also has noted some blurry vision and double vision since she woke up this morning.  Her last known normal was last night at 1030 when she went to bed.  She completed treatment for breast cancer in August of this year.  She is not anticoagulants.       Home Medications Prior to Admission medications   Medication Sig Start Date End Date Taking? Authorizing Provider  acetaminophen (TYLENOL) 500 MG tablet Take 500 mg by mouth every 6 (six) hours as needed for moderate pain.    [provider]  aspirin EC 81 MG EC tablet Take 1 tablet (81 mg total) by mouth daily. 12/28/19   Dorcas Carrow, MD  Biotin 5000 MCG CAPS Take 5,000 mcg by mouth daily with breakfast.     [provider]  cycloSPORINE (RESTASIS) 0.05 % ophthalmic emulsion Place 1 drop into both eyes 2 (two) times daily. 01/08/20   Angiulli, Mcarthur Rossetti, PA-C   diclofenac Sodium (VOLTAREN) 1 % GEL Apply 2 g topically 4 (four) times daily. Patient taking differently: Apply 1 application  topically daily as needed (pain). 01/08/20   Angiulli, Mcarthur Rossetti, PA-C  diphenhydrAMINE 25 mg in sodium chloride 0.9 % 50 mL Inject 25 mg into the vein See admin instructions. Administer 25 mg intravenously at start of Gamunex infusion, then administer 25 mg during infusion    [provider]  diphenoxylate-atropine (LOMOTIL) 2.5-0.025 MG tablet Take 1-2 tablets by mouth 4 (four) times daily as needed for diarrhea or loose stools. 10/28/22   Pollyann Samples, NP  ELDERBERRY PO Take 1 tablet by mouth daily.    [provider]  empagliflozin (JARDIANCE) 25 MG TABS tablet Take 25 mg by mouth daily.    [provider]  esomeprazole (NEXIUM) 40 MG capsule Take 1 capsule (40 mg total) by mouth 2 (two) times daily. 01/08/20   Angiulli, Mcarthur Rossetti, PA-C  Evolocumab with Infusor (REPATHA PUSHTRONEX SYSTEM) 420 MG/3.5ML SOCT Inject 420 mg into the skin every 30 (thirty) days.    [provider]  Exenatide ER (BYDUREON BCISE) 2 MG/0.85ML AUIJ Inject 2 mg into the skin every Monday.    [provider]  ezetimibe (ZETIA) 10 MG tablet TAKE 1 TABLET BY MOUTH EVERY DAY 01/16/21   Yates Decamp, MD  furosemide (LASIX) 40 MG tablet Take 40 mg by mouth daily as  needed for edema.    [provider]  Immune Globulin, Human, 40 GM/400ML SOLN Inject into the vein every 6 (six) weeks. Infusions are administered at home by home health nurse - last infusion 09/08/21 and 09/09/21; next infusion due 10/19/21 and 10/20/21 02/21/20   [provider]  insulin degludec (TRESIBA FLEXTOUCH) 100 UNIT/ML FlexTouch Pen Inject 15 Units into the skin in the morning.    [provider]  Insulin Disposable Pump (OMNIPOD DASH 5 PACK PODS) MISC Use with Humalog 01/10/20   [provider]  insulin lispro (HUMALOG) 100 UNIT/ML injection Inject into the skin  See admin instructions. Inject subcutaneously 3 (three) times daily with meals With meals through the Omnipod Dash Pump every 72 hours as directed    [provider]  Levothyroxine Sodium 200 MCG CAPS Take 200 mcg by mouth daily before breakfast.    [provider]  lidocaine-prilocaine (EMLA) cream Apply to affected area once 10/15/22   Malachy Mood, MD  loperamide (IMODIUM A-D) 2 MG tablet Take 2 mg by mouth 4 (four) times daily as needed for diarrhea or loose stools.    [provider]  loratadine (CLARITIN) 10 MG tablet Take 1 tablet (10 mg total) by mouth at bedtime. 01/08/20   Angiulli, Mcarthur Rossetti, PA-C  meclizine (ANTIVERT) 25 MG tablet Take 25 mg by mouth 3 (three) times daily as needed for dizziness or nausea (or migraine-related nausea). 06/27/13   [provider]  montelukast (SINGULAIR) 10 MG tablet Take 1 tablet (10 mg total) by mouth every morning. 01/08/20   Angiulli, Mcarthur Rossetti, PA-C  mycophenolate (CELLCEPT) 500 MG tablet Take 1,000 mg by mouth 2 (two) times daily. For myasthenia gravis 09/04/21   [provider]  olmesartan (BENICAR) 20 MG tablet Take 1 tablet (20 mg total) by mouth daily. 01/08/20   Angiulli, Mcarthur Rossetti, PA-C  ondansetron (ZOFRAN) 8 MG tablet TAKE 1 TABLET BY MOUTH EVERY 8  HOURS AS NEEDED FOR NAUSEA AND  VOMITING 08/31/23   Carlean Jews, NP  predniSONE (DELTASONE) 2.5 MG tablet Take 7.5 mg by mouth every morning. For myasthenia gravis    [provider]  pregabalin (LYRICA) 25 MG capsule Take 1 capsule (25 mg total) by mouth 2 (two) times daily. 07/28/23   Pollyann Samples, NP  PRESCRIPTION MEDICATION Pt uses a cpap nightly    [provider]  PROAIR HFA 108 (90 BASE) MCG/ACT inhaler Inhale 2 puffs into the lungs every 6 (six) hours as needed for wheezing or shortness of breath.  06/02/13   [provider]  prochlorperazine (COMPAZINE) 10 MG tablet Take 1 tablet (10 mg total) by mouth every 6 (six) hours as  needed for nausea or vomiting. 10/07/22   Malachy Mood, MD  riTUXimab (RITUXAN) 500 MG/50ML injection Inject into the vein every 6 (six) months.    [provider]  rosuvastatin (CRESTOR) 40 MG tablet Take 1 tablet (40 mg total) by mouth daily. 02/13/21 10/15/22  Yates Decamp, MD  tamoxifen (NOLVADEX) 20 MG tablet Take 1 tablet (20 mg total) by mouth daily. 06/07/23   Malachy Mood, MD  topiramate (TOPAMAX) 50 MG tablet TAKE 1 TABLET BY MOUTH AT  BEDTIME 10/04/23   Ihor Austin, NP  traMADol (ULTRAM) 50 MG tablet Take 1 tablet (50 mg total) by mouth every 6 (six) hours as needed for moderate pain. 09/03/22   Abigail Miyamoto, MD  vitamin B-12 (CYANOCOBALAMIN) 1000 MCG tablet Take 1,000 mcg by mouth daily.  [provider]  Vitamin D, Ergocalciferol, (DRISDOL) 50000 UNITS CAPS capsule Take 50,000 Units by mouth every Monday.  12/12/13   [provider]      Allergies    Contrast media [iodinated contrast media], Fluorescein, Iodine, Molds & smuts, Shellfish-derived products, Pholcodine, Azithromycin, Codeine, Metformin hcl er, Oxycodone, Tramadol hcl, and Other    Review of Systems   Review of Systems  Constitutional:  Negative for chills, diaphoresis, fatigue and fever.  HENT:  Negative for congestion, rhinorrhea and sneezing.   Eyes:  Positive for visual disturbance.  Respiratory:  Negative for cough, chest tightness and shortness of breath.   Cardiovascular:  Negative for chest pain and leg swelling.  Gastrointestinal:  Negative for abdominal pain, blood in stool, diarrhea, nausea and vomiting.  Genitourinary:  Negative for difficulty urinating, flank pain, frequency and hematuria.  Musculoskeletal:  Negative for arthralgias and back pain.  Skin:  Negative for rash.  Neurological:  Positive for dizziness, weakness and numbness. Negative for speech difficulty and headaches.    Physical Exam Updated Vital Signs BP (!) 186/78   Pulse 97   Resp (!) 23   Wt 90.5 kg    SpO2 100%   BMI 35.36 kg/m  Physical Exam Constitutional:      Appearance: She is well-developed.  HENT:     Head: Normocephalic and atraumatic.  Eyes:     Pupils: Pupils are equal, round, and reactive to light.     Comments: She seems to have some subtle disconjugate gaze when looking to the left, but is not consistent.  She has visual field deficits, more pronounced in the right lateral visual fields but also on the medial aspect of the left eye.  Cardiovascular:     Rate and Rhythm: Normal rate and regular rhythm.     Heart sounds: Normal heart sounds.  Pulmonary:     Effort: Pulmonary effort is normal. No respiratory distress.     Breath sounds: Normal breath sounds. No wheezing or rales.  Chest:     Chest wall: No tenderness.  Abdominal:     General: Bowel sounds are normal.     Palpations: Abdomen is soft.     Tenderness: There is no abdominal tenderness. There is no guarding or rebound.  Musculoskeletal:        General: Normal range of motion.     Cervical back: Normal range of motion and neck supple.  Lymphadenopathy:     Cervical: No cervical adenopathy.  Skin:    General: Skin is warm and dry.     Findings: No rash.  Neurological:     Mental Status: She is alert and oriented to person, place, and time.     Comments: Subtle drooping of the right side of the face.  She has some decrease sensation in the right side of the face as well as the right arm and the right leg as compared to the left.  She has 4 out of 5 strength on the right as compared to 5 out of 5 on the left.     ED Results / Procedures / Treatments   Labs (all labs ordered are listed, but only abnormal results are displayed) Labs Reviewed  CBC - Abnormal; Notable for the following components:      Result Value   RBC 3.68 (*)    Hemoglobin 10.8 (*)    HCT 34.4 (*)    All other components within normal limits  PROTIME-INR  APTT  DIFFERENTIAL  ETHANOL  COMPREHENSIVE METABOLIC PANEL  RAPID URINE  DRUG SCREEN, HOSP PERFORMED  URINALYSIS, ROUTINE W REFLEX MICROSCOPIC    EKG EKG Interpretation Date/Time:  Friday October 29 2023 11:39:24 EST Ventricular Rate:  98 PR Interval:    QRS Duration:  135 QT Interval:  374 QTC Calculation: 478 R Axis:   13  Text Interpretation: Sinus rhythm Right bundle branch block Minimal ST elevation, inferior leads Confirmed by Rolan Bucco [phone]) on 10/29/2023 12:06:32 PM  Radiology CT HEAD CODE STROKE WO CONTRAST Result Date: 10/29/2023 CLINICAL DATA:  Code stroke. Neuro deficit, acute, stroke suspected. Dizziness for the last week. Acute onset of left-sided face, upper and lower extremity numbness beginning at 8:30 a.m. today. EXAM: CT HEAD WITHOUT CONTRAST TECHNIQUE: Contiguous axial images were obtained from the base of the skull through the vertex without intravenous contrast. RADIATION DOSE REDUCTION: This exam was performed according to the departmental dose-optimization program which includes automated exposure control, adjustment of the mA and/or kV according to patient size and/or use of iterative reconstruction technique. COMPARISON:  CT head without contrast 09/18/2021. FINDINGS: Brain: No acute infarct, hemorrhage, or mass lesion is present. Mild white matter changes demonstrates some progression. Deep brain nuclei are within normal limits. The ventricles are of normal size. Insular ribbon is normal. No acute or focal cortical abnormality is present. No significant extraaxial fluid collection is present. Midline structures are within normal limits. The brainstem and cerebellum are within normal limits. Vascular: Atherosclerotic calcifications are present within the cavernous ICA bilaterally. No hyperdense vessel is present. Skull: Calvarium is intact. No focal lytic or blastic lesions are present. No significant extracranial soft tissue lesion is present. Sinuses/Orbits: Chronic left sphenoid sinus disease is present. The paranasal sinuses and mastoid  air cells are otherwise clear. Bilateral lens replacements are noted. Globes and orbits are otherwise unremarkable. ASPECTS [facility] Stroke Program Early CT Score) - Ganglionic level infarction (caudate, lentiform nuclei, internal capsule, insula, M1-M3 cortex): 7/7 - Supraganglionic infarction (M4-M6 cortex): 3/3 Total score (0-10 with 10 being normal): 10/10 pelvic IMPRESSION: 1. No acute intracranial abnormality or significant interval change. 2. Mild white matter changes demonstrate some progression. This likely reflects the sequela of chronic microvascular ischemia. 3. Aspects is 10/10. 4. Chronic left sphenoid sinus disease. These results were called by telephone at the time of interpretation on 10/29/2023 at 12:06 pm to provider Trevel Dillenbeck , who verbally acknowledged these results. The above was relayed via text pager to Dr. Wilford Corner on 10/29/2023 at 12:06 . Electronically Signed   By: Marin Roberts M.D.   On: 10/29/2023 12:08    Procedures Procedures    Medications Ordered in ED Medications  prochlorperazine (COMPAZINE) injection 10 mg (10 mg Intravenous Given 10/29/23 1212)    ED Course/ Medical Decision Making/ A&P                                 Medical Decision Making Amount and/or Complexity of Data Reviewed Labs: ordered. Radiology: ordered.   Patient is a 73 year old female whose had 1 week history of vertiginous type symptoms.  She woke up this morning with some right side numbness.  She seemed a little weak in her right arm and right leg as compared to her left.  She has visual field deficit on the right side on my exam.  Given this, she was Zenaida Niece positive and code stroke was activated.  She was evaluated by teleneurology, Dr. Wilford Corner.  He initially ordered CTA but she has anaphylaxis to contrast.  He feels that this could be a complex type migraine but does recommend an emergent MRI.  Will transfer to [facility] for this.  Dr. Wilford Corner did order some Compazine to see if that  improves her symptoms.  Labs reviewed.  She has some anemia but appears actually a little better than her most recent prior values.  Dr. Renaye Rakers accepting.  Dr. Amada Jupiter is aware as well.  Final Clinical Impression(s) / ED Diagnoses Final diagnoses:  Vertigo  Right sided weakness    Rx / DC Orders ED Discharge Orders     None         Rolan Bucco, MD 10/29/23 1232    Rolan Bucco, MD 10/29/23 1234

## 2023-10-29 NOTE — Progress Notes (Signed)
 Telestroke cart was activated at 1149. Per treatment team, pt's LKW was at 2100 last night with c/o R facial numbness and dizziness upon waking up this AM. Dr. Fredderick Phenix, EDP assessed pt prior to cart activation. Pt was transported to CT prior to cart activation and returned to room at 1157. TSMD was paged for code stroke at 1150. Dr. Wilford Corner, TSMD appeared on telestroke cart at 1151 to assess the patient. Based on TSMD's assessment, pt does not meet criteria for emergent interventions at this time 2/2 outside of the window. TSMD to f/u with EDP regarding recommendations. No further needs from Telestroke RN at this time. Telestroke cart disconnected at 1205.

## 2023-10-29 NOTE — ED Provider Notes (Signed)
  Physical Exam  BP (!) 135/58 (BP Location: Right Arm)   Pulse 98   Temp 97.6 F (36.4 C) (Oral)   Resp 16   Wt 90.5 kg   SpO2 100%   BMI 35.36 kg/m   Physical Exam  Procedures  Procedures  ED Course / MDM   Clinical Course as of 10/29/23 1932  Fri Oct 29, 2023  1832 Patient has arrived to the [facility] emergency department.  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  On exam does not have any upper extremity drift but does have some numbness on the right side of her face and right leg.  Does have a left upper quadrant visual field cut.  Dr Amada Jupiter updated regarding stroke on imaging. Will need plavix load and admission.  [RP]  1932 Dr Austin Miles to admit the patient for further management. [RP]    Clinical Course User Index [RP] Rondel Baton, MD   Medical Decision Making Amount and/or Complexity of Data Reviewed Labs: ordered. Radiology: ordered.  Risk Prescription drug management. Decision regarding hospitalization.      Rondel Baton, MD 10/29/23 [phone]

## 2023-10-29 NOTE — ED Triage Notes (Signed)
 Pt reports dizziness since last Friday seen by provider and given meclizine for vertigo. Vomiting. Woke up this morning approx 0830 noticed left side of face, arm and leg numbness

## 2023-10-29 NOTE — ED Notes (Signed)
 Patient transported to MRI

## 2023-10-29 NOTE — Hospital Course (Addendum)
 The patient is a 73 year old African-American female with past medical history significant for CVA, history of insulin-dependent diabetes mellitus type 2 on insulin pump, hypertension, hyperlipidemia, myasthenia gravis, OSA, GERD, history of thyroid cancer status post radiation and total thyroidectomy, history of breast cancer status postlumpectomy and adjuvant chemotherapy and radiation who presented to the ED with right-sided weakness and numbness.  Further workup was done and she is found to have an acute CVA and undergoing a stroke workup.  Neurology consulted and PT OT recommending CIR now.  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  Echocardiogram was done and showed EF of 60 to 65% with grade 1 diastolic dysfunction.  Ultrasound of the right carotid showed right ICA which was consistent with a 40 to 59% stenosis of the left carotid was patent with ICA stent with some narrowing noted proximal to the mid stent.  LDL was 41 and hemoglobin was 9.2, neurology recommended dual antiplatelet therapy with aspirin and Plavix for 3 weeks and then just clopidogrel alone.  Further care per neurology and PT/OT recommending CIR.  Essential Hypertension: Home medication include olmesartan 20 g p.o. daily and blood pressure is now stable and goal blood pressures less than 220/110.  Continue monitor blood pressures per protocol. Last BP reading was 150/57  Hyperlipidemia: LDL is 41 and triglycerides are 164 with her goal being less than 70.  Continue Rosuvastatin 40 mg mg p.o. daily, ezetimibe 10 mg p.o. daily and Repatha  Uncontrolled controlled diabetes mellitus type 2: Insulin pump at home and Jardiance.  Hemoglobin A1c with here was 9.2.  Continuing  long-acting with Lantus 20 and moderate NovoLog sounds with insulin before meals and at bedtime.  Will need further adjustments and holding her insulin pump.  CBGs ranging from 290-341  Normocytic Anemia/Thrombocytopenia: Hgb/Hct is now gone from 10.8/34.4 -> 8.6/27.1 count dropped from 166 is now 147.  Continue to monitor for signs and symptoms of bleeding; no overt bleeding noted and check anemia panel in a.m. and CBC in a.m.  Renal insufficiency/Metabolic Acidosis: -BUN/Cr Trend: Recent Labs  Lab 10/29/23 1155 10/30/23 0248  BUN 18 20  CREATININE 0.89 1.15*  -Is a slight metabolic acidosis with a CO2 of 19, anion gap of 6, chloride level 110 -Avoid Nephrotoxic Medications, Contrast Dyes, Hypotension and Dehydration to Ensure Adequate Renal Perfusion and will need to Renally Adjust Meds -Continue to Monitor and Trend Renal Function carefully and repeat CMP in the AM   Hypothyroidism: Check TSH and continue levothyroxine 200 mcg p.o. daily  Ocular Myasthenia Gravis: C/w family at 1000 g p.o. twice daily and Rituxumab and gets IVIG q6weeks and follows [facility] as outpatient   HypoK+: Improved. K+ is now 4.6  Hypoalbuminemia: Patient's Albumin is now 3.2. CTM and Trend and repeat CMP in the AM  OSA: C/w CPAP  GERD/GI Prophylaxis: C/w PPI with Pantoprazole 40 mg po Daily  Class II Obesity: Complicates overall prognosis and care. Estimated body mass index is 35.36 kg/m as calculated from the following:   Height as of 07/27/23: 5\' 3"  (1.6 m).   Weight as of this encounter: 90.5 kg. Weight Loss and Dietary Counseling given

## 2023-10-29 NOTE — ED Notes (Signed)
 Message sent to pharmacy regarding verification for patient's CELLCEPT and also stated if medication is stored in the main pharmacy to tube it down to the yellow zone tube station.

## 2023-10-29 NOTE — ED Notes (Addendum)
 Patient refused MRI at this time.

## 2023-10-29 NOTE — Consult Note (Signed)
 [facility] Telemedicine Consult   Requesting Provider: Dr. Wendee Beavers Consult Participants: Dr. Marthe Patch, Telespecialist RN [place]   Bedside RN Zella Ball Location of the provider: [place] [facility] Location of the patient: [facility] bed 7  This consult was provided via telemedicine with 2-way video and audio communication. The patient/family was informed that care would be provided in this way and agreed to receive care in this manner.   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  Last known well is when she went to bed last night 9 or 10 PM.  Woke up this morning at 8:30 AM with right-sided facial numbness and also some heaviness/numbness on the right arm and leg. She reports vertigo that she has had for 1 week but no significant headache. Code stroke was activated because she complained of blurred vision and some double vision although her visual fields are intact on camera exam that was assisted by the bedside RN.    Past Medical History:  Diagnosis Date   Arthritis    "all over my body" (03/13/2013)   Asthma    Asymptomatic carotid artery stenosis, bilateral 10/08/2018   Breast cancer (HCC)    GERD (gastroesophageal reflux disease)    H/O hiatal hernia    Headache    pt states she has had headaches for about 6 months "off and on"   Heart murmur    pt had echocardiogram on 09/17/21   Hypercholesterolemia 10/08/2018   Hypertension    Hypothyroidism    Myasthenia gravis (HCC)    "in my eyes; diagnsosed > 7 yr ago" (03/13/2013)   Sleep apnea    on CPAP   Stroke (HCC) 2021   Thyroid carcinoma (HCC)    Type II diabetes mellitus (HCC)      Current Facility-Administered Medications:    prochlorperazine (COMPAZINE)  injection 10 mg, 10 mg, Intravenous, Once, Milon Dikes, MD  Current Outpatient Medications:    acetaminophen (TYLENOL) 500 MG tablet, Take 500 mg by mouth every 6 (six) hours as needed for moderate pain., Disp: , Rfl:    aspirin EC 81 MG EC tablet, Take 1 tablet (81 mg total) by mouth daily., Disp:  , Rfl:    Biotin 5000 MCG CAPS, Take 5,000 mcg by mouth daily with breakfast. , Disp: , Rfl:    cycloSPORINE (RESTASIS) 0.05 % ophthalmic emulsion, Place 1 drop into both eyes 2 (two) times daily., Disp: 0.4 mL, Rfl: 0   diclofenac Sodium (VOLTAREN) 1 % GEL, Apply 2 g topically 4 (four) times daily. (Patient taking differently: Apply 1 application  topically daily as needed (pain).), Disp: 2 g, Rfl: 0   diphenhydrAMINE 25 mg in sodium chloride 0.9 % 50 mL, Inject 25 mg into the vein See admin instructions. Administer 25 mg intravenously at start of Gamunex infusion, then administer 25 mg during infusion, Disp: , Rfl:    diphenoxylate-atropine (LOMOTIL) 2.5-0.025 MG tablet, Take 1-2 tablets by mouth 4 (four) times daily as needed for diarrhea or loose stools., Disp: 30 tablet, Rfl: 2   ELDERBERRY PO, Take 1 tablet by mouth daily., Disp: , Rfl:    empagliflozin (JARDIANCE) 25 MG TABS tablet, Take 25 mg by mouth daily., Disp: , Rfl:    esomeprazole (NEXIUM) 40 MG capsule, Take 1 capsule (40 mg total) by mouth 2 (  two) times daily., Disp: 60 capsule, Rfl: 0   Evolocumab with Infusor (REPATHA PUSHTRONEX SYSTEM) 420 MG/3.5ML SOCT, Inject 420 mg into the skin every 30 (thirty) days., Disp: , Rfl:    Exenatide ER (BYDUREON BCISE) 2 MG/0.85ML AUIJ, Inject 2 mg into the skin every Monday., Disp: , Rfl:    ezetimibe (ZETIA) 10 MG tablet, TAKE 1 TABLET BY MOUTH EVERY DAY, Disp: 90 tablet, Rfl: 1   furosemide (LASIX) 40 MG tablet, Take 40 mg by mouth daily as needed for edema., Disp: , Rfl:    Immune Globulin, Human, 40 GM/400ML SOLN, Inject into the vein every 6 (six) weeks. Infusions are administered at home by  home health nurse - last infusion 09/08/21 and 09/09/21; next infusion due 10/19/21 and 10/20/21, Disp: , Rfl:    insulin degludec (TRESIBA FLEXTOUCH) 100 UNIT/ML FlexTouch Pen, Inject 15 Units into the skin in the morning., Disp: , Rfl:    Insulin Disposable Pump (OMNIPOD DASH 5 PACK PODS) MISC, Use with Humalog, Disp: , Rfl:    insulin lispro (HUMALOG) 100 UNIT/ML injection, Inject into the skin See admin instructions. Inject subcutaneously 3 (three) times daily with meals With meals through the Omnipod Dash Pump every 72 hours as directed, Disp: , Rfl:    Levothyroxine Sodium 200 MCG CAPS, Take 200 mcg by mouth daily before breakfast., Disp: , Rfl:    lidocaine-prilocaine (EMLA) cream, Apply to affected area once, Disp: 30 g, Rfl: 3   loperamide (IMODIUM A-D) 2 MG tablet, Take 2 mg by mouth 4 (four) times daily as needed for diarrhea or loose stools., Disp: , Rfl:    loratadine (CLARITIN) 10 MG tablet, Take 1 tablet (10 mg total) by mouth at bedtime., Disp: 30 tablet, Rfl: 0   meclizine (ANTIVERT) 25 MG tablet, Take 25 mg by mouth 3 (three) times daily as needed for dizziness or nausea (or migraine-related nausea)., Disp: , Rfl:    montelukast (SINGULAIR) 10 MG tablet, Take 1 tablet (10 mg total) by mouth every morning., Disp: 30 tablet, Rfl: 0   mycophenolate (CELLCEPT) 500 MG tablet, Take 1,000 mg by mouth 2 (two) times daily. For myasthenia gravis, Disp: , Rfl:    olmesartan (BENICAR) 20 MG tablet, Take 1 tablet (20 mg total) by mouth daily., Disp: 30 tablet, Rfl: 0   ondansetron (ZOFRAN) 8 MG tablet, TAKE 1 TABLET BY MOUTH EVERY 8  HOURS AS NEEDED FOR NAUSEA AND  VOMITING, Disp: 60 tablet, Rfl: 1   predniSONE (DELTASONE) 2.5 MG tablet, Take 7.5 mg by mouth every morning. For myasthenia gravis, Disp: , Rfl:    pregabalin (LYRICA) 25 MG capsule, Take 1 capsule (25 mg total) by mouth 2 (two) times daily., Disp: 180 capsule, Rfl: 0   PRESCRIPTION MEDICATION, Pt uses a cpap nightly, Disp: , Rfl:     PROAIR HFA 108 (90 BASE) MCG/ACT inhaler, Inhale 2 puffs into the lungs every 6 (six) hours as needed for wheezing or shortness of breath. , Disp: , Rfl:    prochlorperazine (COMPAZINE) 10 MG tablet, Take 1 tablet (10 mg total) by mouth every 6 (six) hours as needed for nausea or vomiting., Disp: 30 tablet, Rfl: 1   riTUXimab (RITUXAN) 500 MG/50ML injection, Inject into the vein every 6 (six) months., Disp: , Rfl:    rosuvastatin (CRESTOR) 40 MG tablet, Take 1 tablet (40 mg total) by mouth daily., Disp: 90 tablet, Rfl: 3   tamoxifen (NOLVADEX) 20 MG tablet, Take 1 tablet (20 mg total) by mouth daily.,  Disp: 90 tablet, Rfl: 1   topiramate (TOPAMAX) 50 MG tablet, TAKE 1 TABLET BY MOUTH AT  BEDTIME, Disp: 100 tablet, Rfl: 0   traMADol (ULTRAM) 50 MG tablet, Take 1 tablet (50 mg total) by mouth every 6 (six) hours as needed for moderate pain., Disp: 25 tablet, Rfl: 0   vitamin B-12 (CYANOCOBALAMIN) 1000 MCG tablet, Take 1,000 mcg by mouth daily., Disp: , Rfl:    Vitamin D, Ergocalciferol, (DRISDOL) 50000 UNITS CAPS capsule, Take 50,000 Units by mouth every Monday. , Disp: , Rfl:     LKW: 10 PM on 10/28/2023 IV thrombolysis given?: No, outside the window IR Thrombectomy? No, symptoms not consistent with LVO.  Patient is allergic to iodinated contrast-anaphylaxis hence emergent imaging of the vessels not performed Modified Rankin Scale: 0-Completely asymptomatic and back to baseline post- stroke Time of teleneurologist evaluation: 11:51 AM  Exam: Vitals:   10/29/23 1142 10/29/23 1145  BP: (!) 186/78   Pulse: 100 97  Resp: 16 (!) 23  SpO2: 100% 100%    General: Awake in no distress Neurological exam Awake alert oriented x 3.  No dysarthria.  No aphasia.  Cranial nerves II to XII: Pupils equal round react light, extraocular movements intact, visual fields full, facial sensation diminished on the right, face appears symmetric grossly, tongue and palate midline.  Motor examination with no drift.   Sensation diminished throughout the right hemibody including the face arm and leg.  No gross dysmetria.   NIHSS 1A: Level of Consciousness - 0 1B: Ask Month and Age - 0 1C: 'Blink Eyes' & 'Squeeze Hands' - 0 2: Test Horizontal Extraocular Movements - 0 3: Test Visual Fields - 0 4: Test Facial Palsy - 0 5A: Test Left Arm Motor Drift - 0 5B: Test Right Arm Motor Drift - 0 6A: Test Left Leg Motor Drift - 0 6B: Test Right Leg Motor Drift - 0 7: Test Limb Ataxia - 0 8: Test Sensation - 2 9: Test Language/Aphasia- 0 10: Test Dysarthria - 0 11: Test Extinction/Inattention - 0 NIHSS score: 2   Imaging Reviewed personally: CT head-aspects 10  Labs reviewed in epic and pertinent values follow: CBC    Component Value Date/Time   WBC 5.8 05/04/2023 0915   WBC 10.4 09/03/2022 0442   RBC 3.32 (L) 05/04/2023 0915   HGB 10.2 (L) 05/04/2023 0915   HCT 31.7 (L) 05/04/2023 0915   PLT 208 05/04/2023 0915   MCV 95.5 05/04/2023 0915   MCH 30.7 05/04/2023 0915   MCHC 32.2 05/04/2023 0915   RDW 13.2 05/04/2023 0915   LYMPHSABS 0.8 05/04/2023 0915   MONOABS 0.5 05/04/2023 0915   EOSABS 0.2 05/04/2023 0915   BASOSABS 0.0 05/04/2023 0915   CMP     Component Value Date/Time   NA 140 05/04/2023 0915   NA 143 05/31/2019 1101   K 4.2 05/04/2023 0915   CL 110 05/04/2023 0915   CO2 24 05/04/2023 0915   GLUCOSE 147 (H) 05/04/2023 0915   BUN 33 (H) 05/04/2023 0915   BUN 24 05/31/2019 1101   CREATININE 1.01 (H) 05/04/2023 0915   CALCIUM 9.1 05/04/2023 0915   PROT 7.4 05/04/2023 0915   PROT 6.5 05/31/2019 1101   ALBUMIN 3.7 05/04/2023 0915   ALBUMIN 4.0 05/31/2019 1101   AST 33 05/04/2023 0915   ALT 41 05/04/2023 0915   ALKPHOS 160 (H) 05/04/2023 0915   BILITOT 0.3 05/04/2023 0915   GFRNONAA 60 (L) 05/04/2023 0915   GFRAA >60 12/28/2019  1159     Assessment: 73 year old woman with above past medical history presenting for right facial numbness that she noticed upon waking up this  morning.  Last known well outside the window for IV thrombolysis.  Exam not consistent with LVO. Has had a prior left thalamic stroke as well as TIAs related to carotid stenosis bilaterally. Also has a history of chronic headaches. Emergent vessel imaging not performed due to anaphylaxis to iodinated contrast. Differentials include complex migraine versus small vessel infarct. Will need further imaging in the form of MRI  Recommendations:  Give a trial of Compazine-see if that helps his symptoms of this is some sort of a complicated migraine etiology. I would recommend transfer to [facility] for an MRI-if positive for stroke, will need admission for stroke workup otherwise treat as complex migraine and have her follow-up outpatient. Plan was discussed with Dr. Fredderick Phenix  -- Milon Dikes, MD Neurologist [facility] Pager: [phone]

## 2023-10-29 NOTE — ED Notes (Signed)
 EDP at bedside

## 2023-10-30 ENCOUNTER — Observation Stay (HOSPITAL_BASED_OUTPATIENT_CLINIC_OR_DEPARTMENT_OTHER)

## 2023-10-30 ENCOUNTER — Observation Stay (HOSPITAL_COMMUNITY)

## 2023-10-30 DIAGNOSIS — E1169 Type 2 diabetes mellitus with other specified complication: Secondary | ICD-10-CM

## 2023-10-30 DIAGNOSIS — E1165 Type 2 diabetes mellitus with hyperglycemia: Secondary | ICD-10-CM

## 2023-10-30 DIAGNOSIS — I10 Essential (primary) hypertension: Secondary | ICD-10-CM

## 2023-10-30 DIAGNOSIS — G4733 Obstructive sleep apnea (adult) (pediatric): Secondary | ICD-10-CM

## 2023-10-30 DIAGNOSIS — E785 Hyperlipidemia, unspecified: Secondary | ICD-10-CM | POA: Diagnosis not present

## 2023-10-30 DIAGNOSIS — R29702 NIHSS score 2: Secondary | ICD-10-CM

## 2023-10-30 DIAGNOSIS — I6329 Cerebral infarction due to unspecified occlusion or stenosis of other precerebral arteries: Secondary | ICD-10-CM

## 2023-10-30 DIAGNOSIS — I639 Cerebral infarction, unspecified: Secondary | ICD-10-CM | POA: Diagnosis not present

## 2023-10-30 DIAGNOSIS — I739 Peripheral vascular disease, unspecified: Secondary | ICD-10-CM | POA: Diagnosis not present

## 2023-10-30 DIAGNOSIS — Z7984 Long term (current) use of oral hypoglycemic drugs: Secondary | ICD-10-CM

## 2023-10-30 DIAGNOSIS — Z8673 Personal history of transient ischemic attack (TIA), and cerebral infarction without residual deficits: Secondary | ICD-10-CM

## 2023-10-30 DIAGNOSIS — K219 Gastro-esophageal reflux disease without esophagitis: Secondary | ICD-10-CM | POA: Diagnosis not present

## 2023-10-30 DIAGNOSIS — I6389 Other cerebral infarction: Secondary | ICD-10-CM

## 2023-10-30 DIAGNOSIS — Z794 Long term (current) use of insulin: Secondary | ICD-10-CM

## 2023-10-30 DIAGNOSIS — E119 Type 2 diabetes mellitus without complications: Secondary | ICD-10-CM

## 2023-10-30 DIAGNOSIS — G7000 Myasthenia gravis without (acute) exacerbation: Secondary | ICD-10-CM

## 2023-10-30 LAB — CBG MONITORING, ED
Glucose-Capillary: 341 mg/dL — ABNORMAL HIGH (ref 70–99)
Glucose-Capillary: 346 mg/dL — ABNORMAL HIGH (ref 70–99)
Glucose-Capillary: 322 mg/dL — ABNORMAL HIGH (ref 70–99)

## 2023-10-30 LAB — CBC
HCT: 27.1 % — ABNORMAL LOW (ref 36.0–46.0)
Hemoglobin: 8.6 g/dL — ABNORMAL LOW (ref 12.0–15.0)
MCH: 30.6 pg (ref 26.0–34.0)
MCHC: 31.7 g/dL (ref 30.0–36.0)
MCV: 96.4 fL (ref 80.0–100.0)
Platelets: 147 10*3/uL — ABNORMAL LOW (ref 150–400)
RBC: 2.81 MIL/uL — ABNORMAL LOW (ref 3.87–5.11)
RDW: 12.8 % (ref 11.5–15.5)
WBC: 5 10*3/uL (ref 4.0–10.5)
nRBC: 0 % (ref 0.0–0.2)

## 2023-10-30 LAB — LIPID PANEL
Cholesterol: 112 mg/dL (ref 0–200)
HDL: 38 mg/dL — ABNORMAL LOW (ref >40)
LDL Cholesterol: 41 mg/dL (ref 0–99)
Total CHOL/HDL Ratio: 2.9 ratio
Triglycerides: 164 mg/dL — ABNORMAL HIGH (ref <150)
VLDL: 33 mg/dL (ref 0–40)

## 2023-10-30 LAB — BASIC METABOLIC PANEL
Anion gap: 6 (ref 5–15)
BUN: 20 mg/dL (ref 8–23)
CO2: 19 mmol/L — ABNORMAL LOW (ref 22–32)
Calcium: 8.2 mg/dL — ABNORMAL LOW (ref 8.9–10.3)
Chloride: 110 mmol/L (ref 98–111)
Creatinine, Ser: 1.15 mg/dL — ABNORMAL HIGH (ref 0.44–1.00)
GFR, Estimated: 51 mL/min — ABNORMAL LOW (ref >60)
Glucose, Bld: 390 mg/dL — ABNORMAL HIGH (ref 70–99)
Potassium: 4.6 mmol/L (ref 3.5–5.1)
Sodium: 135 mmol/L (ref 135–145)

## 2023-10-30 LAB — ECHOCARDIOGRAM COMPLETE
Area-P 1/2: 3.19 cm2
S' Lateral: 2 cm
Weight: 3193.6 [oz_av]

## 2023-10-30 LAB — FOLATE: Folate: 23.8 ng/mL (ref >5.9)

## 2023-10-30 LAB — GLUCOSE, CAPILLARY
Glucose-Capillary: 290 mg/dL — ABNORMAL HIGH (ref 70–99)
Glucose-Capillary: 312 mg/dL — ABNORMAL HIGH (ref 70–99)

## 2023-10-30 LAB — VITAMIN B12: Vitamin B-12: 489 pg/mL (ref 180–914)

## 2023-10-30 MED ORDER — INSULIN GLARGINE 100 UNIT/ML ~~LOC~~ SOLN
20.0000 [IU] | Freq: Every day | SUBCUTANEOUS | Status: DC
  Administered 2023-10-30: 20 [IU] via SUBCUTANEOUS
  Filled 2023-10-30 (×2): qty 0.2

## 2023-10-30 MED ORDER — INSULIN ASPART 100 UNIT/ML IJ SOLN
0.0000 [IU] | Freq: Every day | INTRAMUSCULAR | Status: DC
  Administered 2023-10-30 – 2023-11-01 (×3): 4 [IU] via SUBCUTANEOUS

## 2023-10-30 MED ORDER — INSULIN ASPART 100 UNIT/ML IJ SOLN
0.0000 [IU] | Freq: Three times a day (TID) | INTRAMUSCULAR | Status: DC
  Administered 2023-10-31: 8 [IU] via SUBCUTANEOUS
  Administered 2023-10-31: 11 [IU] via SUBCUTANEOUS
  Administered 2023-10-31: 5 [IU] via SUBCUTANEOUS
  Administered 2023-11-01 (×2): 8 [IU] via SUBCUTANEOUS
  Administered 2023-11-01: 11 [IU] via SUBCUTANEOUS
  Administered 2023-11-02: 8 [IU] via SUBCUTANEOUS

## 2023-10-30 MED ORDER — ONDANSETRON HCL 4 MG/2ML IJ SOLN
4.0000 mg | Freq: Four times a day (QID) | INTRAMUSCULAR | Status: DC | PRN
  Administered 2023-10-30 – 2023-11-01 (×3): 4 mg via INTRAVENOUS
  Filled 2023-10-30 (×3): qty 2

## 2023-10-30 NOTE — Evaluation (Signed)
 Occupational Therapy Evaluation Patient Details Name: [name] MRN: [id number] DOB: 11/23/1950 Today's Date: 10/30/2023   History of Present Illness   Pt is a 73 yo female presenting to [facility] on 10/30/23 with R sided weakness and numbness. MRI showed acute infarct of the R pons and L proximal ICA stenosis. PMHx significant for seronegative myasthenia gravis, HTN, Hx of CVA, carotid artery stenosis and paraneoplastic retinopathy, chemo and radiation therapy.     Clinical Impressions Pt admitted for above, PTA pt lived alone and reports being ind in ADLs/iADLs and using RW for mobility, not driving but has support from family when needed. Pt currently presenting with impaired strength and coordination of RUE/RLE which impact her balance and ADL performance. Pt able to stand and ambulate with min A + RW, completes ADLs with Mod A to CGA. She continues to c/o blurry vision and diplopia that did not subside with either eye occluded. OT to continue following pt acutely to address listed deficits and help transition to next level of care. Patient has the potential to reach Mod I and demos the ability to tolerate 3 hours of therapy. Pt would benefit from an intensive rehab program to help maximize functional independence.       If plan is discharge home, recommend the following:   A little help with walking and/or transfers;A lot of help with bathing/dressing/bathroom;Assistance with cooking/housework;Assist for transportation;Help with stairs or ramp for entrance     Functional Status Assessment   Patient has had a recent decline in their functional status and demonstrates the ability to make significant improvements in function in a reasonable and predictable amount of time.     Equipment Recommendations   BSC/3in1     Recommendations for Other Services   Rehab consult     Precautions/Restrictions   Precautions Precautions: Fall Recall of Precautions/Restrictions:  Intact Restrictions Weight Bearing Restrictions Per Provider Order: No     Mobility Bed Mobility Overal bed mobility: Needs Assistance Bed Mobility: Supine to Sit, Sit to Supine     Supine to sit: Min assist Sit to supine: Min assist   General bed mobility comments: Min A to assist BLEs to EOB and return to bed    Transfers Overall transfer level: Needs assistance Equipment used: Rolling walker (2 wheels) Transfers: Sit to/from Stand Sit to Stand: Min assist                  Balance Overall balance assessment: Needs assistance   Sitting balance-Leahy Scale: Good     Standing balance support: Bilateral upper extremity supported Standing balance-Leahy Scale: Poor Standing balance comment: UE support and CGA for balance in static standing                           ADL either performed or assessed with clinical judgement   ADL Overall ADL's : Needs assistance/impaired Eating/Feeding: Sitting;Minimal assistance Eating/Feeding Details (indicate cue type and reason): able to use LUE to feed, gave pt red tubing for built up handles when needed for RUE Grooming: Wash/dry face;Standing;Contact guard assist Grooming Details (indicate cue type and reason): incorporating bilat intergation Upper Body Bathing: Sitting;Set up   Lower Body Bathing: Sitting/lateral leans;Minimal assistance   Upper Body Dressing : Sitting;Moderate assistance Upper Body Dressing Details (indicate cue type and reason): assists with RUE Lower Body Dressing: Moderate assistance;Sitting/lateral leans   Toilet Transfer: Minimal assistance;Rolling walker (2 wheels);Ambulation   Toileting- Clothing Manipulation and Hygiene: Sit to/from  stand;Minimal assistance       Functional mobility during ADLs: Minimal assistance;Rolling walker (2 wheels)       Vision Baseline Vision/History: 1 Wears glasses Patient Visual Report: Diplopia Vision Assessment?: Yes Eye Alignment: Within  Functional Limits Alignment/Gaze Preference: Within Defined Limits Tracking/Visual Pursuits: Decreased smoothness of horizontal tracking Convergence: Impaired (comment) (bilat eyes not converging towards target) Visual Fields: No apparent deficits (blurry vision but able to see in all fields) Diplopia Assessment: Objects split side to side;Present in far gaze;Present in near gaze Depth Perception:  [facility]) Additional Comments: diplopia worse far away, pt does wear glasses at baseline but does not have them which may impact the results of testing     Perception         Praxis Praxis: [facility]       Pertinent Vitals/Pain Pain Assessment Pain Assessment: Faces Faces Pain Scale: Hurts a little bit Pain Location: R shoulder with ROM Pain Descriptors / Indicators: Sore Pain Intervention(s): Monitored during session, Limited activity within patient's tolerance     Extremity/Trunk Assessment Upper Extremity Assessment Upper Extremity Assessment: RUE deficits/detail;Right hand dominant (LUE WFL) RUE Deficits / Details: Generally weak with weak/loose grasp, R shoulder AROM of 90 degrees and maintain further ROM when passively positioned. Impaired 2 point discrimination as well throughout forearm but intact in hand. RUE overall 3/5 moves against gravity but not tolerable to resistance RUE: Shoulder pain with ROM RUE Sensation: decreased light touch;decreased proprioception RUE Coordination: decreased fine motor;decreased gross motor   Lower Extremity Assessment Lower Extremity Assessment: RLE deficits/detail RLE Deficits / Details: AAROM WFL, strength hip flexion 2-/5, knee extension 3+/5, ankle DF 2/5 RLE Sensation: decreased light touch RLE Coordination: decreased gross motor   Cervical / Trunk Assessment Cervical / Trunk Assessment: Kyphotic   Communication Communication Communication: No apparent difficulties   Cognition Arousal: Alert Behavior During Therapy: WFL for tasks  assessed/performed Cognition: No apparent impairments                               Following commands: Intact       Cueing  General Comments   Cueing Techniques: Verbal cues  Pt c/o dizziness which resolved by end of session, BP not working properly. Informed RN of need for new cuff as he also had troubles with an accurate Bp reading for pt   Exercises Hand Exercises Wrist Flexion: AROM, Right, 5 reps Wrist Extension: 5 reps, AROM, Right Digit Composite Flexion: AROM, Right, Limitations Composite Flexion Limitations: 3 reps, slow and controlled Composite Extension: AROM, Right, Limitations Composite Extension Limitations: 3 reps, slow and controlled Opposition: AROM, Right Other Exercises Other Exercises: R lateral elbow propping with push up into midline x5 reps Other Exercises: Red therasponge squeezes   Shoulder Instructions      Home Living Family/patient expects to be discharged to:: Private residence Living Arrangements: Alone Available Help at Discharge: Family (has 6 brothers/sisters who most live close who help since she does not drive) Type of Home: House Home Access: Stairs to enter [employer] of Steps: 2 Entrance Stairs-Rails: None Home Layout: One level     Bathroom Shower/Tub: Chief Strategy Officer: Standard     Home Equipment: Agricultural consultant (2 wheels);Tub bench;Grab bars - tub/shower          Prior Functioning/Environment Prior Level of Function : Independent/Modified Independent             Mobility Comments: walks with  RW ADLs Comments: ind, cooks/cleans, manages meds    OT Problem List: Decreased strength;Impaired UE functional use;Impaired balance (sitting and/or standing);Impaired sensation   OT Treatment/Interventions: Self-care/ADL training;Balance training;Therapeutic exercise;Therapeutic activities;Neuromuscular education;Patient/family education;DME and/or AE instruction      OT  Goals(Current goals can be found in the care plan section)   Acute Rehab OT Goals Patient Stated Goal: To get better OT Goal Formulation: With patient Time For Goal Achievement: 11/13/23 Potential to Achieve Goals: Good ADL Goals Pt Will Perform Grooming: with supervision;standing Pt Will Perform Lower Body Bathing: sitting/lateral leans;with set-up Pt Will Perform Upper Body Dressing: with set-up;sitting Pt Will Perform Lower Body Dressing: with supervision;sit to/from stand Pt Will Transfer to Toilet: with supervision;ambulating Pt/caregiver will Perform Home Exercise Program: Increased ROM;Increased strength;Right Upper extremity;With written HEP provided;Independently   OT Frequency:  Min 2X/week    Co-evaluation              AM-PAC OT "6 Clicks" Daily Activity     Outcome Measure Help from another person eating meals?: A Little Help from another person taking care of personal grooming?: A Little Help from another person toileting, which includes using toliet, bedpan, or urinal?: A Little Help from another person bathing (including washing, rinsing, drying)?: A Lot Help from another person to put on and taking off regular upper body clothing?: A Lot Help from another person to put on and taking off regular lower body clothing?: A Lot 6 Click Score: 15   End of Session Equipment Utilized During Treatment: Gait belt;Rolling walker (2 wheels) Nurse Communication: Mobility status  Activity Tolerance: Patient tolerated treatment well Patient left: in bed;with call bell/phone within reach;with bed alarm set;with family/visitor present  OT Visit Diagnosis: Unsteadiness on feet (R26.81);Other abnormalities of gait and mobility (R26.89);Hemiplegia and hemiparesis;Muscle weakness (generalized) (M62.81) Hemiplegia - Right/Left: Right Hemiplegia - dominant/non-dominant: Dominant Hemiplegia - caused by: Cerebral infarction                Time: 1612-1700 OT Time Calculation  (min): 48 min Charges:  OT General Charges $OT Visit: 1 Visit OT Evaluation $OT Eval Moderate Complexity: 1 Mod OT Treatments $Self Care/Home Management : 8-22 mins $Therapeutic Activity: 8-22 mins  10/30/2023  AB, OTR/L  Acute Rehabilitation Services  Office: [phone]   Tristan Schroeder 10/30/2023, 5:53 PM

## 2023-10-30 NOTE — Evaluation (Signed)
 Physical Therapy Evaluation Patient Details Name: [name] MRN: [id number] DOB: 21-Mar-1951 Today's Date: 10/30/2023  History of Present Illness  Pt is a 73 yo female presenting to [facility] on 10/30/23 with R sided weakness and numbness. MRI showed acute infarct of the R pons and L proximal ICA stenosis. PMHx significant for seronegative myasthenia gravis, HTN, Hx of CVA, carotid artery stenosis and paraneoplastic retinopathy, chemo and radiation therapy.  Clinical Impression  Patient presents with decreased mobility due to R sided weakness, sensory deficits, decreased balance and decreased activity tolerance.  Previously living alone and managing ADL and most IADL with RW though help for driving.  She was able to ambulate to hallway today with RW and min A with help for moving R LE and for managing walker.  She states could set up help from various family/friends.  Feel she will benefit from skilled PT in the acute setting and from post-acute inpatient rehab (>3 hours/day) prior to d/c home.         If plan is discharge home, recommend the following: A little help with walking and/or transfers;A little help with bathing/dressing/bathroom;Assistance with cooking/housework;Direct supervision/assist for medications management;Assist for transportation;Help with stairs or ramp for entrance   Can travel by private vehicle        Equipment Recommendations    Recommendations for Other Services  Rehab consult    Functional Status Assessment Patient has had a recent decline in their functional status and demonstrates the ability to make significant improvements in function in a reasonable and predictable amount of time.     Precautions / Restrictions Precautions Precautions: Fall      Mobility  Bed Mobility Overal bed mobility: Needs Assistance Bed Mobility: Supine to Sit, Sit to Supine     Supine to sit: Min assist, HOB elevated, Used rails Sit to supine: Min assist   General bed  mobility comments: up to sit with A for R LE and to lift trunk, to supine assist for R LE    Transfers Overall transfer level: Needs assistance Equipment used: Rolling walker (2 wheels) Transfers: Sit to/from Stand Sit to Stand: Min assist           General transfer comment: up from tall stretcher in ED    Ambulation/Gait Ambulation/Gait assistance: Min assist, Mod assist Gait Distance (Feet): 20 Feet Assistive device: Rolling walker (2 wheels) Gait Pattern/deviations: Step-to pattern, Decreased step length - right, Decreased stride length, Drifts right/left, Decreased dorsiflexion - right       General Gait Details: assist to progress the R LE, assist for balance and walker management on turns  Stairs            Wheelchair Mobility     Tilt Bed    Modified Rankin (Stroke Patients Only) Modified Rankin (Stroke Patients Only) Pre-Morbid Rankin Score: Slight disability Modified Rankin: Moderately severe disability     Balance Overall balance assessment: Needs assistance   Sitting balance-Leahy Scale: Good     Standing balance support: Bilateral upper extremity supported Standing balance-Leahy Scale: Poor Standing balance comment: UE support and CGA for balance in static standing                             Pertinent Vitals/Pain Pain Assessment Pain Assessment: No/denies pain    Home Living Family/patient expects to be discharged to:: Private residence Living Arrangements: Alone Available Help at Discharge: Family (has 6 brothers/sisters who most live close who  help since she does not drive) Type of Home: House Home Access: Stairs to enter Entrance Stairs-Rails: None Entrance Stairs-Number of Steps: 2   Home Layout: One level Home Equipment: Agricultural consultant (2 wheels);Tub bench;Grab bars - tub/shower      Prior Function Prior Level of Function : Independent/Modified Independent             Mobility Comments: walks with RW ADLs  Comments: cooks/cleans, manages meds     Extremity/Trunk Assessment   Upper Extremity Assessment Upper Extremity Assessment: Defer to OT evaluation    Lower Extremity Assessment Lower Extremity Assessment: RLE deficits/detail RLE Deficits / Details: AAROM WFL, strength hip flexion 2-/5, knee extension 3+/5, ankle DF 2/5 RLE Sensation: decreased light touch RLE Coordination: decreased gross motor    Cervical / Trunk Assessment Cervical / Trunk Assessment: Kyphotic  Communication   Communication Communication: No apparent difficulties    Cognition Arousal: Alert Behavior During Therapy: WFL for tasks assessed/performed   PT - Cognitive impairments: No apparent impairments                         Following commands: Intact       Cueing       General Comments General comments (skin integrity, edema, etc.): VSS with mobility, had nausea previously today and RN had recently delivered meds    Exercises     Assessment/Plan    PT Assessment Patient needs continued PT services  PT Problem List Decreased strength;Decreased mobility;Decreased activity tolerance;Decreased balance;Decreased knowledge of use of DME;Impaired sensation;Decreased safety awareness       PT Treatment Interventions      PT Goals (Current goals can be found in the Care Plan section)  Acute Rehab PT Goals Patient Stated Goal: return to independent PT Goal Formulation: With patient Time For Goal Achievement: 11/13/23 Potential to Achieve Goals: Good    Frequency Min 3X/week     Co-evaluation               AM-PAC PT "6 Clicks" Mobility  Outcome Measure Help needed turning from your back to your side while in a flat bed without using bedrails?: A Little Help needed moving from lying on your back to sitting on the side of a flat bed without using bedrails?: A Little Help needed moving to and from a bed to a chair (including a wheelchair)?: A Little Help needed standing up from  a chair using your arms (e.g., wheelchair or bedside chair)?: A Little Help needed to walk in hospital room?: Total Help needed climbing 3-5 steps with a railing? : Total 6 Click Score: 14    End of Session Equipment Utilized During Treatment: Gait belt Activity Tolerance: Patient limited by fatigue Patient left: in bed;with call bell/phone within reach   PT Visit Diagnosis: Other abnormalities of gait and mobility (R26.89);Hemiplegia and hemiparesis;Muscle weakness (generalized) (M62.81) Hemiplegia - Right/Left: Right Hemiplegia - dominant/non-dominant: Dominant Hemiplegia - caused by: Cerebral infarction    Time: 1410-1437 PT Time Calculation (min) (ACUTE ONLY): 27 min   Charges:   PT Evaluation $PT Eval Moderate Complexity: 1 Mod PT Treatments $Gait Training: 8-22 mins PT General Charges $$ ACUTE PT VISIT: 1 Visit         Sheran Lawless, PT Acute Rehabilitation Services Office:[phone] 10/30/2023   Elray Mcgregor 10/30/2023, 3:29 PM

## 2023-10-30 NOTE — Progress Notes (Signed)
  Echocardiogram 2D Echocardiogram has been performed.  Delcie Roch 10/30/2023, 9:55 AM

## 2023-10-30 NOTE — Plan of Care (Signed)

## 2023-10-30 NOTE — Care Management (Signed)
 Transition of Care [facility]) - Inpatient Brief Assessment   Patient Details  Name: [name] MRN: [id number] Date of Birth: 26-May-1951  Transition of [facility]) CM/SW Contact:    Lockie Pares, RN Phone Number: 10/30/2023, 11:35 AM   Clinical Narrative:  Spoke to patient at bedside. She currently lives alone and uses a walker, she has a shower chair. She has family to call on for assistance.  She would be amenable to home health. If it was recommended.  TOC will monitor patient for needs, recommendations and transitions of care  Transition of Care Asessment: Insurance and Status: Insurance coverage has been reviewed Patient has primary care physician: Yes Home environment has been reviewed: yes Patient lives alone Prior level of function:: Walks with walker Prior/Current Home Services: No current home services Social Drivers of Health Review: SDOH reviewed no interventions necessary Readmission risk has been reviewed: Yes Transition of care needs: transition of care needs identified, TOC will continue to follow

## 2023-10-30 NOTE — ED Notes (Signed)
 Pt to vascular at this time.

## 2023-10-30 NOTE — Progress Notes (Signed)
 VASCULAR LAB    Carotid duplex has been performed.  See CV proc for preliminary results.   Johnye Kist, RVT 10/30/2023, 10:52 AM

## 2023-10-30 NOTE — Care Management Obs Status (Signed)
 MEDICARE OBSERVATION STATUS NOTIFICATION   Patient Details  Name: [name] MRN: [id number] Date of Birth: May 01, 1951   Medicare Observation Status Notification Given:  Yes    Lockie Pares, RN 10/30/2023, 10:43 AM

## 2023-10-30 NOTE — ED Notes (Signed)
 Pt ambulated to bathroom with use of wheelchair. Pt tolerated well.

## 2023-10-30 NOTE — Progress Notes (Addendum)
 STROKE TEAM PROGRESS NOTE   INTERIM HISTORY/SUBJECTIVE Seen in room. Right hemiparesis persists though some mild and her numbness.  Hemodynamically stable. Waiting PT/OT recs.   OBJECTIVE  CBC    Component Value Date/Time   WBC 5.0 10/30/2023 0248   RBC 2.81 (L) 10/30/2023 0248   HGB 8.6 (L) 10/30/2023 0248   HGB 10.2 (L) 05/04/2023 0915   HCT 27.1 (L) 10/30/2023 0248   PLT 147 (L) 10/30/2023 0248   PLT 208 05/04/2023 0915   MCV 96.4 10/30/2023 0248   MCH 30.6 10/30/2023 0248   MCHC 31.7 10/30/2023 0248   RDW 12.8 10/30/2023 0248   LYMPHSABS 0.7 10/29/2023 1155   MONOABS 0.5 10/29/2023 1155   EOSABS 0.1 10/29/2023 1155   BASOSABS 0.0 10/29/2023 1155    BMET    Component Value Date/Time   NA 135 10/30/2023 0248   NA 143 05/31/2019 1101   K 4.6 10/30/2023 0248   CL 110 10/30/2023 0248   CO2 19 (L) 10/30/2023 0248   GLUCOSE 390 (H) 10/30/2023 0248   BUN 20 10/30/2023 0248   BUN 24 05/31/2019 1101   CREATININE 1.15 (H) 10/30/2023 0248   CREATININE 1.01 (H) 05/04/2023 0915   CALCIUM 8.2 (L) 10/30/2023 0248   GFRNONAA 51 (L) 10/30/2023 0248   GFRNONAA 60 (L) 05/04/2023 0915    IMAGING past 24 hours MR ANGIO HEAD WO CONTRAST Result Date: 10/29/2023 CLINICAL DATA:  Stroke, follow up EXAM: MRA NECK WITHOUT CONTRAST MRA HEAD WITHOUT CONTRAST TECHNIQUE: Angiographic images of the Circle of Willis were acquired using MRA technique without intravenous contrast. COMPARISON:  MRA head/neck 09/17/2021. FINDINGS: MRA NECK FINDINGS Aortic arch: Great vessel origins are patent. Right carotid system: Right ICA appears mildly narrowed. Artifact in this area limits assessment. Left carotid system: Suspected flow limiting stenosis of the left proximal ICA. Artifact in this area limits assessment. Vertebral arteries: Right dominant. Both vertebral arteries are patent without significant stenosis. MRA HEAD FINDINGS Anterior circulation: Bilateral intracranial ICAs, MCAs, and ACAs are patent  without proximal hemodynamically significant stenosis. No aneurysm identified. Posterior circulation: Bilateral intradural vertebral arteries, basilar artery and bilateral posterior cerebral arteries are patent without proximal hemodynamically significant stenosis. 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 Recommend CTA of the neck to confirm and further characterize. MRA head: No large vessel occlusion or proximal hemodynamically significant stenosis. Electronically Signed   By: Feliberto Harts M.D.   On: 10/29/2023 22:56   MR ANGIO NECK WO CONTRAST Result Date: 10/29/2023 CLINICAL DATA:  Stroke, follow up EXAM: MRA NECK WITHOUT CONTRAST MRA HEAD WITHOUT CONTRAST TECHNIQUE: Angiographic images of the Circle of Willis were acquired using MRA technique without intravenous contrast. COMPARISON:  MRA head/neck 09/17/2021. FINDINGS: MRA NECK FINDINGS Aortic arch: Great vessel origins are patent. Right carotid system: Right ICA appears mildly narrowed. Artifact in this area limits assessment. Left carotid system: Suspected flow limiting stenosis of the left proximal ICA. Artifact in this area limits assessment. Vertebral arteries: Right dominant. Both vertebral arteries are patent without significant stenosis. MRA HEAD FINDINGS Anterior circulation: Bilateral intracranial ICAs, MCAs, and ACAs are patent without proximal hemodynamically significant stenosis. No aneurysm identified. Posterior circulation: Bilateral intradural vertebral arteries, basilar artery and bilateral posterior cerebral arteries are patent without proximal hemodynamically significant stenosis. 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 Recommend CTA of the neck to confirm and further characterize. MRA head: No large vessel occlusion or proximal hemodynamically significant stenosis. Electronically Signed   By:  Feliberto Harts M.D.   On: 10/29/2023 22:56   MR BRAIN WO  CONTRAST Result Date: 10/29/2023 CLINICAL DATA:  Provided history: Neuro deficit, acute, stroke suspected. EXAM: MRI HEAD WITHOUT CONTRAST TECHNIQUE: Multiplanar, multiecho pulse sequences of the brain and surrounding structures were obtained without intravenous contrast. COMPARISON:  Head CT 10/29/2023.  Brain MRI 09/17/2021. FINDINGS: Brain: No age-advanced or lobar predominant cerebral atrophy. 5 mm acute infarct within the dorsal pons on the right. Chronic lacunar infarcts and chronic small ischemic changes elsewhere within the pons, new from the prior brain MRI of 09/17/2021. Chronic lacunar infarcts within the bilateral thalami. Few nonspecific punctate chronic microhemorrhages scattered within the supratentorial brain. No evidence of an intracranial mass. No extra-axial fluid collection. No midline shift. Vascular: Maintained flow voids within the proximal large arterial vessels. Skull and upper cervical spine: No focal worrisome marrow lesion. Incompletely assessed cervical spondylosis. Susceptibility artifact arising from ACDF hardware. Sinuses/Orbits: No mass or acute finding within the imaged orbits. Prior bilateral ocular lens replacement. Mild mucosal thickening within the right maxillary sinus. T1 hyperintense signal throughout much of the left sphenoid sinus which may reflect the presence of inspissated secretions, a mucous retention cyst or a polyp. IMPRESSION: 1. 5 mm acute infarct within the dorsal pons on the right. 2. Chronic lacunar infarcts and chronic small ischemic changes elsewhere within the pons, new from the prior brain MRI of 09/17/2021. 3. Chronic lacunar infarcts within the bilateral thalami, also new from the prior MRI. 4. Few nonspecific punctate chronic microhemorrhages within the supratentorial brain. 5. Mild paranasal sinus disease as described. Electronically Signed   By: Jackey Loge D.O.   On: 10/29/2023 17:58   CT HEAD CODE STROKE WO CONTRAST Result Date: 10/29/2023 CLINICAL  DATA:  Code stroke. Neuro deficit, acute, stroke suspected. Dizziness for the last week. Acute onset of left-sided face, upper and lower extremity numbness beginning at 8:30 a.m. today. EXAM: CT HEAD WITHOUT CONTRAST TECHNIQUE: Contiguous axial images were obtained from the base of the skull through the vertex without intravenous contrast. RADIATION DOSE REDUCTION: This exam was performed according to the departmental dose-optimization program which includes automated exposure control, adjustment of the mA and/or kV according to patient size and/or use of iterative reconstruction technique. COMPARISON:  CT head without contrast 09/18/2021. FINDINGS: Brain: No acute infarct, hemorrhage, or mass lesion is present. Mild white matter changes demonstrates some progression. Deep brain nuclei are within normal limits. The ventricles are of normal size. Insular ribbon is normal. No acute or focal cortical abnormality is present. No significant extraaxial fluid collection is present. Midline structures are within normal limits. The brainstem and cerebellum are within normal limits. Vascular: Atherosclerotic calcifications are present within the cavernous ICA bilaterally. No hyperdense vessel is present. Skull: Calvarium is intact. No focal lytic or blastic lesions are present. No significant extracranial soft tissue lesion is present. Sinuses/Orbits: Chronic left sphenoid sinus disease is present. The paranasal sinuses and mastoid air cells are otherwise clear. Bilateral lens replacements are noted. Globes and orbits are otherwise unremarkable. ASPECTS [facility] Stroke Program Early CT Score) - Ganglionic level infarction (caudate, lentiform nuclei, internal capsule, insula, M1-M3 cortex): 7/7 - Supraganglionic infarction (M4-M6 cortex): 3/3 Total score (0-10 with 10 being normal): 10/10 pelvic IMPRESSION: 1. No acute intracranial abnormality or significant interval change. 2. Mild white matter changes demonstrate some  progression. This likely reflects the sequela of chronic microvascular ischemia. 3. Aspects is 10/10. 4. Chronic left sphenoid sinus disease. These results were called by telephone at  the time of interpretation on 10/29/2023 at 12:06 pm to provider MELANIE BELFI , who verbally acknowledged these results. The above was relayed via text pager to Dr. Wilford Corner on 10/29/2023 at 12:06 . Electronically Signed   By: Marin Roberts M.D.   On: 10/29/2023 12:08    Vitals:   10/30/23 0245 10/30/23 0553 10/30/23 0555 10/30/23 0854  BP: (!) 140/52 (!) 135/47  (!) 113/46  Pulse: 86 86  87  Resp: 18 (!) 21  15  Temp:   97.8 F (36.6 C) 97.8 F (36.6 C)  TempSrc:   Oral Oral  SpO2: 97% 96%  95%  Weight:         PHYSICAL EXAM General:  Alert, well-nourished, well-developed patient in no acute distress Psych:  Mood and affect appropriate for situation CV: Regular rate and rhythm on monitor Respiratory:  Regular, unlabored respirations on room air GI: Abdomen soft and nontender   NEURO:  Mental Status: AA&Ox3, patient is able to give clear and coherent history Speech/Language: speech is without dysarthria or aphasia.  Naming, repetition, fluency, and comprehension intact.  Cranial Nerves:  II: PERRL. Visual fields full.  III, IV, VI: EOMI. Eyelids elevate symmetrically.  V: Sensation is intact to light touch and symmetrical to face.  VII: Face is symmetrical resting and smiling VIII: hearing intact to voice. IX, X: Palate elevates symmetrically. Phonation is normal.  [id number] shrug 5/5. XII: tongue is midline without fasciculations. Motor: left arm and right arm 5/5 with decreased grip on right grip, right leg with a drift 3/5, left leg 5/5 Tone: is normal and bulk is normal Sensation- right side mild numbness  Coordination: slowed fine motor in right hand and left over right orbiting  Gait- deferred  Most Recent NIH 2     ASSESSMENT/PLAN  Ms. TANDI HANKO is a 73 y.o. female with  history of hypertension, hypothyroidism, chronic headaches, diabetes, ocular myasthenia, prior left hemispheric TIA and left thalamic infarct, presenting for evaluation of right facial numbness noted upon waking up this morning. NIH on Admission 2  Acute Ischemic Infarct:  right pontine infarct  Etiology:  small vessel disease   Code Stroke CT head No acute abnormality. Small vessel disease. Atrophy. ASPECTS 10.    MRA Suspected flow-limiting stenosis of the left proximal ICA.  MRI  5 mm acute infarct within the dorsal pons on the right.  2D Echo EF 60-65%. LV grade I diastolic dysfunction US carotid right ICA are consistent with a 40-59% stenosis. Left Carotid: Patent ICA stent with some narrowing noted proximal through mid stent  LDL 41 HgbA1c 9.2 VTE prophylaxis - Lovenox aspirin 81 mg daily prior to admission, now on aspirin 81 mg daily and clopidogrel 75 mg daily for 3 weeks and then Plavix alone. 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 MD to update note as needed.   Elmer Picker, DNP, FNP-BC [facility] Pager: [phone]  I have personally obtained history,examined this patient, reviewed notes, independently viewed imaging studies, participated in medical  decision making and plan of care.ROS completed by me personally and pertinent positives fully documented  I have made any additions or clarifications directly to the above note. Agree with note above.  Patient presented with right-sided numbness and weakness secondary to pontine infarct from small vessel disease.  Recommend aspirin and Plavix for 3 weeks followed by Plavix alone and aggressive risk factor modification.  Continue treatment of myasthenia as per outpatient neurology.  Check carotid ultrasound to look for left carotid stent patency.  Mobilize out of bed.  Therapy consults.  Discussed with Dr. Marland Mcalpine.  Greater than 50% time during this 50-minute visit was spent in counseling and coordination of care and discussion patient care team and answering questions. Stroke team will sign off.  Kindly call for questions Delia Heady, MD Medical Director [facility] Pager: [phone] 10/30/2023 4:35 PM   To contact Stroke Continuity provider, please refer to [email or web address]. After hours, contact General Neurology

## 2023-10-30 NOTE — Progress Notes (Signed)
 PROGRESS NOTE    [name]  ZOX:[id number] DOB: 24-Aug-1951 DOA: 10/29/2023 PCP: Irena Reichmann, DO   Brief Narrative:  The patient is a 73 year old African-American female with past medical history significant for CVA, history of insulin-dependent diabetes mellitus type 2 on insulin pump, hypertension, hyperlipidemia, myasthenia gravis, OSA, GERD, history of thyroid cancer status post radiation and total thyroidectomy, history of breast cancer status postlumpectomy and adjuvant chemotherapy and radiation who presented to the ED with right-sided weakness and numbness.  Further workup was done and she is found to have an acute CVA and undergoing a stroke workup.  Neurology consulted and PT OT recommending CIR now.  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  Echocardiogram was done and showed EF of 60 to 65% with grade 1 diastolic dysfunction.  Ultrasound of the right carotid showed right ICA which was consistent with a 40 to 59% stenosis of the left carotid was patent with ICA stent with some narrowing noted proximal to the mid stent.  LDL was 41 and hemoglobin was 9.2, neurology recommended dual antiplatelet therapy with aspirin and Plavix for 3 weeks and then just clopidogrel alone.  Further care per neurology and PT/OT recommending CIR.  Essential Hypertension: Home medication include olmesartan 20 g p.o. daily and blood pressure is now stable and goal blood pressures less than 220/110.  Continue monitor blood pressures per protocol. Last BP reading was 150/57  Hyperlipidemia: LDL is 41 and triglycerides are 164 with her goal being less than 70.  Continue Rosuvastatin 40 mg mg p.o. daily, ezetimibe 10 mg p.o. daily and  Repatha  Uncontrolled controlled diabetes mellitus type 2: Insulin pump at home and Jardiance.  Hemoglobin A1c with here was 9.2.  Continuing long-acting with Lantus 20 and moderate NovoLog sounds with insulin before meals and at bedtime.  Will need further adjustments and holding her insulin pump.  CBGs ranging from 290-341  Normocytic Anemia/Thrombocytopenia: Hgb/Hct is now gone from 10.8/34.4 -> 8.6/27.1 count dropped from 166 is now 147.  Continue to monitor for signs and symptoms of bleeding; no overt bleeding noted and check anemia panel in a.m. and CBC in a.m.  Renal insufficiency/Metabolic Acidosis: -BUN/Cr Trend: Recent Labs  Lab 10/29/23 1155 10/30/23 0248  BUN 18 20  CREATININE 0.89 1.15*  -Is a slight metabolic acidosis with a CO2 of 19, anion gap of 6, chloride level 110 -Avoid Nephrotoxic Medications, Contrast Dyes, Hypotension and Dehydration to Ensure Adequate Renal Perfusion and will need to Renally Adjust Meds -Continue to Monitor and Trend Renal Function carefully and repeat CMP in the AM   Hypothyroidism: Check TSH and continue levothyroxine 200 mcg p.o. daily  Ocular Myasthenia Gravis: C/w family at 1000 g p.o. twice daily and Rituxumab and gets IVIG q6weeks and follows [facility] as outpatient   HypoK+: Improved. K+ is now 4.6  Hypoalbuminemia: Patient's Albumin is now 3.2. CTM and Trend and repeat CMP in the AM  OSA: C/w CPAP  GERD/GI Prophylaxis: C/w PPI with Pantoprazole 40 mg po Daily  Class II Obesity: Complicates overall prognosis and care. Estimated body mass index is 35.36 kg/m as calculated from the following:   Height as of 07/27/23: 5\' 3"  (1.6 m).   Weight as of this  encounter: 90.5 kg. Weight Loss and Dietary Counseling given   DVT prophylaxis: enoxaparin (LOVENOX) injection 40 mg Start: 10/29/23 2015    Code Status: Full Code Family Communication: No family present at Beside  Disposition Plan:  Level of care: Telemetry Medical Status is:  Observation The patient will require care spanning > 2 midnights and should be moved to inpatient because: Undergoing CVA workup and PT/OT recommending CIR   Consultants:  Neurology  Procedures:  As delineated as above  Antimicrobials:  Anti-infectives (From admission, onward)    None       Subjective: Seen and examined at bedside and was doing okay and was nauseous earlier.  No chest pain or shortness breath.  Feels weak on the right.  No other concerns or points this time.  Objective: Vitals:   10/30/23 0555 10/30/23 0854 10/30/23 1215 10/30/23 2005  BP:  (!) 113/46 (!) 113/45 (!) 150/57  Pulse:  87 85   Resp:  15 15   Temp: 97.8 F (36.6 C) 97.8 F (36.6 C) 97.9 F (36.6 C) 97.8 F (36.6 C)  TempSrc: Oral Oral Oral Oral  SpO2:  95% 98%   Weight:       No intake or output data in the 24 hours ending 10/30/23 2025 Filed Weights   10/29/23 1158  Weight: 90.5 kg   Examination: Physical Exam:  Constitutional: WN/WD obese African-American female in no acute distress Respiratory: Diminished to auscultation bilaterally, no wheezing, rales, rhonchi or crackles. Normal respiratory effort and patient is not tachypenic. No accessory muscle use.  Unlabored breathing Cardiovascular: RRR, no murmurs / rubs / gallops. S1 and S2 auscultated. No extremity edema. Abdomen: Soft, non-tender, distended secondary to body habitus. Bowel sounds positive.  GU: Deferred. Musculoskeletal: No clubbing / cyanosis of digits/nails. No joint deformity upper and lower extremities.  Skin: No rashes, lesions, ulcers on limited skin evaluation. No induration; Warm and dry.  Neurologic: CN 2-12 grossly intact with no focal deficits. Romberg sign and cerebellar reflexes not assessed.  Psychiatric: Normal judgment and insight. Alert and oriented x 3. Normal mood and appropriate affect.   Data Reviewed: I have personally reviewed following labs and imaging studies  CBC: Recent Labs  Lab  10/29/23 1155 10/30/23 0248  WBC 6.2 5.0  NEUTROABS 4.8  --   HGB 10.8* 8.6*  HCT 34.4* 27.1*  MCV 93.5 96.4  PLT 166 147*   Basic Metabolic Panel: Recent Labs  Lab 10/29/23 1155 10/30/23 0248  NA 141 135  K 3.3* 4.6  CL 112* 110  CO2 21* 19*  GLUCOSE 196* 390*  BUN 18 20  CREATININE 0.89 1.15*  CALCIUM 8.8* 8.2*   GFR: Estimated Creatinine Clearance: 48.6 mL/min (A) (by C-G formula based on SCr of 1.15 mg/dL (H)). Liver Function Tests: Recent Labs  Lab 10/29/23 1155  AST 25  ALT 22  ALKPHOS 84  BILITOT 0.5  PROT 7.6  ALBUMIN 3.2*   No results for input(s): "LIPASE", "AMYLASE" in the last 168 hours. No results for input(s): "AMMONIA" in the last 168 hours. Coagulation Profile: Recent Labs  Lab 10/29/23 1155  INR 1.0   Cardiac Enzymes: No results for input(s): "CKTOTAL", "CKMB", "CKMBINDEX", "TROPONINI" in the last 168 hours. BNP (last 3 results) No results for input(s): "PROBNP" in the last 8760 hours. HbA1C: Recent Labs    10/29/23 1935  HGBA1C 9.2*   CBG: Recent Labs  Lab 10/29/23 2159 10/30/23 0742 10/30/23 1149 10/30/23 1250 10/30/23 1707  GLUCAP 314* 341* 346*  322* 290*   Lipid Profile: Recent Labs    10/30/23 0248  CHOL 112  HDL 38*  LDLCALC 41  TRIG 604*  CHOLHDL 2.9   Thyroid Function Tests: No results for input(s): "TSH", "T4TOTAL", "FREET4", "T3FREE", "THYROIDAB" in the last 72 hours. Anemia Panel: Recent Labs    10/29/23 2224  VITAMINB12 489  FOLATE 23.8   Sepsis Labs: No results for input(s): "PROCALCITON", "LATICACIDVEN" in the last 168 hours.  No results found for this or any previous visit (from the past 240 hours).   Radiology Studies: VAS US CAROTID Result Date: 10/30/2023 Carotid Arterial Duplex Study Patient Name:  [name]  Date of Exam:   10/30/2023 Medical Rec #: [id number]        Accession #:    [id number] Date of Birth: November 18, 1950         Patient Gender: F Patient Age:   71 years Exam Location[facility]  [facility] Procedure:      VAS US CAROTID Referring Phys: Elmer Picker --------------------------------------------------------------------------------  Indications:       CVA, Carotid artery disease and Bilateral stents--Left TCAR                    09/24/21 Left TCAR 10/23/21. Risk Factors:      Hypertension, hyperlipidemia, Diabetes, prior CVA. Other Factors:     Myasthenia Gravis, Tyroid cancer with radiation 2005 and                    thyroidectomy 2014, Breast cancer with chemo 12/23. Comparison Study:  Prior carotid duplex done 12/09/21 indicated patent bilateral                    ICA stents. Performing Technologist: Sherren Kerns RVS  Examination Guidelines: A complete evaluation includes B-mode imaging, spectral Doppler, color Doppler, and power Doppler as needed of all accessible portions of each vessel. Bilateral testing is considered an integral part of a complete examination. Limited examinations for reoccurring indications may be performed as noted.  Right Carotid Findings: +----------+--------+--------+--------+------------------+------------------+           PSV cm/sEDV cm/sStenosisPlaque DescriptionComments           +----------+--------+--------+--------+------------------+------------------+ CCA Prox  132     12                                                   +----------+--------+--------+--------+------------------+------------------+ CCA Distal90      12                                intimal thickening +----------+--------+--------+--------+------------------+------------------+ ICA Prox                                            stent              +----------+--------+--------+--------+------------------+------------------+ ICA Mid                                             stent              +----------+--------+--------+--------+------------------+------------------+  ICA Distal151     30                                                    +----------+--------+--------+--------+------------------+------------------+ ECA       257     5                                                    +----------+--------+--------+--------+------------------+------------------+ +----------+--------+-------+--------+-------------------+           PSV cm/sEDV cmsDescribeArm Pressure (mmHG) +----------+--------+-------+--------+-------------------+ Subclavian109                                        +----------+--------+-------+--------+-------------------+ +---------+--------+--+--------+--+---------+ VertebralPSV cm/s94EDV cm/s16Antegrade +---------+--------+--+--------+--+---------+  Right Stent(s): +---------------+---+--+++------------------------+ Prox to Stent  74 15                         +---------------+---+--+++------------------------+ Proximal Stent 80 17                         +---------------+---+--+++------------------------+ Mid Stent      91 12acoustic shadowing noted +---------------+---+--+++------------------------+ Distal Stent   11514                         +---------------+---+--+++------------------------+ Distal to [id number]                         +---------------+---+--+++------------------------+   Left Carotid Findings: +----------+--------+--------+--------+------------------+--------+           PSV cm/sEDV cm/sStenosisPlaque DescriptionComments +----------+--------+--------+--------+------------------+--------+ CCA Prox  109     9                                          +----------+--------+--------+--------+------------------+--------+ CCA Distal87      10              heterogenous               +----------+--------+--------+--------+------------------+--------+ ICA Prox                                            stent    +----------+--------+--------+--------+------------------+--------+ ICA Mid                                              stent    +----------+--------+--------+--------+------------------+--------+ ICA Distal131     26                                         +----------+--------+--------+--------+------------------+--------+ ECA       279     4                                          +----------+--------+--------+--------+------------------+--------+ +----------+--------+--------+--------+-------------------+  PSV cm/sEDV cm/sDescribeArm Pressure (mmHG) +----------+--------+--------+--------+-------------------+ [id number]                                         +----------+--------+--------+--------+-------------------+ +---------+--------+--+--------+--+---------+ VertebralPSV cm/s71EDV cm/s13Antegrade +---------+--------+--+--------+--+---------+  Left Stent(s): +---------------+---+--+++---------+ Prox to Stent  67 8           +---------------+---+--+++---------+ Proximal Stent 12525narrowing +---------------+---+--+++---------+ Mid Stent      13118narrowing +---------------+---+--+++---------+ Distal Stent   11422          +---------------+---+--+++---------+ Distal to Stent99 17          +---------------+---+--+++---------+    Summary: Right Carotid: Velocities in the right ICA are consistent with a 40-59%                stenosis. Patent ICA stent with acoustic shadowing noted mid                stent. Velocities within stent have increased since prior study                done 12/09/2021. ECA velocities have increased from 152 to 257                with change in waveform since prior study. Left Carotid: Patent ICA stent with some narrowing noted proximal through mid               stent. Velocities within the stent have increased since prior               study done 12/09/21. ECA velocities have increased from 108 to 279               with change in waveform since prior study. Vertebrals:  Bilateral vertebral arteries demonstrate antegrade flow.  Subclavians: Normal flow hemodynamics were seen in bilateral subclavian              arteries. *See table(s) above for measurements and observations.  Electronically signed by Delia Heady MD on 10/30/2023 at 12:33:17 PM.    Final    ECHOCARDIOGRAM COMPLETE Result Date: 10/30/2023    ECHOCARDIOGRAM REPORT   Patient Name:   [name] Date of Exam: 10/30/2023 Medical Rec #:  [id number]       Height:       63.0 in Accession #:    [id number]      Weight:       199.6 lb Date of Birth:  11-06-1950        BSA:          1.932 m Patient Age:    72 years        BP:           113/46 mmHg Patient Gender: F               HR:           91 bpm. Exam Location:  Inpatient Procedure: 2D Echo (Both Spectral and Color Flow Doppler were utilized during            procedure). Indications:    stroke  History:        Patient has prior history of Echocardiogram examinations, most                 recent 09/17/2021. Risk Factors:Sleep Apnea, Dyslipidemia,                 Diabetes and  Hypertension.  Sonographer:    Delcie Roch RDCS Referring Phys: [id number] SAGAR H JINWALA IMPRESSIONS  1. Left ventricular ejection fraction, by estimation, is 60 to 65%. The left ventricle has normal function. The left ventricle has no regional wall motion abnormalities. Left ventricular diastolic parameters are consistent with Grade I diastolic dysfunction (impaired relaxation).  2. Right ventricular systolic function is normal. The right ventricular size is normal. There is normal pulmonary artery systolic pressure.  3. The mitral valve is abnormal. Trivial mitral valve regurgitation. No evidence of mitral stenosis. Severe mitral annular calcification.  4. The aortic valve is tricuspid. There is moderate calcification of the aortic valve. There is moderate thickening of the aortic valve. Aortic valve regurgitation is not visualized. Aortic valve sclerosis/calcification is present, without any evidence of aortic stenosis.  5. The inferior vena cava is  normal in size with greater than 50% respiratory variability, suggesting right atrial pressure of 3 mmHg. FINDINGS  Left Ventricle: Left ventricular ejection fraction, by estimation, is 60 to 65%. The left ventricle has normal function. The left ventricle has no regional wall motion abnormalities. Strain was performed and the global longitudinal strain is indeterminate. The left ventricular internal cavity size was normal in size. There is no left ventricular hypertrophy. Left ventricular diastolic parameters are consistent with Grade I diastolic dysfunction (impaired relaxation). Right Ventricle: The right ventricular size is normal. No increase in right ventricular wall thickness. Right ventricular systolic function is normal. There is normal pulmonary artery systolic pressure. The tricuspid regurgitant velocity is 2.76 m/s, and  with an assumed right atrial pressure of 3 mmHg, the estimated right ventricular systolic pressure is 33.5 mmHg. Left Atrium: Left atrial size was normal in size. Right Atrium: Right atrial size was normal in size. Pericardium: There is no evidence of pericardial effusion. Mitral Valve: The mitral valve is abnormal. There is mild thickening of the mitral valve leaflet(s). There is mild calcification of the mitral valve leaflet(s). Severe mitral annular calcification. Trivial mitral valve regurgitation. No evidence of mitral valve stenosis. Tricuspid Valve: The tricuspid valve is normal in structure. Tricuspid valve regurgitation is not demonstrated. No evidence of tricuspid stenosis. Aortic Valve: The aortic valve is tricuspid. There is moderate calcification of the aortic valve. There is moderate thickening of the aortic valve. Aortic valve regurgitation is not visualized. Aortic valve sclerosis/calcification is present, without any  evidence of aortic stenosis. Pulmonic Valve: The pulmonic valve was normal in structure. Pulmonic valve regurgitation is trivial. No evidence of pulmonic  stenosis. Aorta: The aortic root is normal in size and structure. Venous: The inferior vena cava is normal in size with greater than 50% respiratory variability, suggesting right atrial pressure of 3 mmHg. IAS/Shunts: No atrial level shunt detected by color flow Doppler. Additional Comments: 3D was performed not requiring image post processing on an independent workstation and was indeterminate.  LEFT VENTRICLE PLAX 2D LVIDd:         3.80 cm Diastology LVIDs:         2.00 cm LV e' medial:    6.53 cm/s LV PW:         0.90 cm LV E/e' medial:  15.8 LV IVS:        1.00 cm LV e' lateral:   8.38 cm/s                        LV E/e' lateral: 12.3  RIGHT VENTRICLE          IVC  RV Basal diam:  2.30 cm  IVC diam: 1.90 cm TAPSE (M-mode): 1.8 cm LEFT ATRIUM             Index        RIGHT ATRIUM           Index LA diam:        3.00 cm 1.55 cm/m   RA Area:     10.50 cm LA Vol (A2C):   33.3 ml 17.24 ml/m  RA Volume:   21.20 ml  10.97 ml/m LA Vol (A4C):   43.1 ml 22.31 ml/m LA Biplane Vol: 39.7 ml 20.55 ml/m  AORTIC VALVE LVOT Vmax:   126.00 cm/s LVOT Vmean:  83.000 cm/s LVOT VTI:    0.212 m  AORTA Ao Root diam: 3.10 cm Ao Asc diam:  3.00 cm MITRAL VALVE                TRICUSPID VALVE MV Area (PHT): 3.19 cm     TR Peak grad:   30.5 mmHg MV Decel Time: 238 msec     TR Vmax:        276.00 cm/s MV E velocity: 103.00 cm/s MV A velocity: 168.00 cm/s  SHUNTS MV E/A ratio:  0.61         Systemic VTI: 0.21 m Charlton Haws MD Electronically signed by Charlton Haws MD Signature Date/Time: 10/30/2023/11:17:22 AM    Final    MR ANGIO HEAD WO CONTRAST Result Date: 10/29/2023 CLINICAL DATA:  Stroke, follow up EXAM: MRA NECK WITHOUT CONTRAST MRA HEAD WITHOUT CONTRAST TECHNIQUE: Angiographic images of the Circle of Willis were acquired using MRA technique without intravenous contrast. COMPARISON:  MRA head/neck 09/17/2021. FINDINGS: MRA NECK FINDINGS Aortic arch: Great vessel origins are patent. Right carotid system: Right ICA appears mildly  narrowed. Artifact in this area limits assessment. Left carotid system: Suspected flow limiting stenosis of the left proximal ICA. Artifact in this area limits assessment. Vertebral arteries: Right dominant. Both vertebral arteries are patent without significant stenosis. MRA HEAD FINDINGS Anterior circulation: Bilateral intracranial ICAs, MCAs, and ACAs are patent without proximal hemodynamically significant stenosis. No aneurysm identified. Posterior circulation: Bilateral intradural vertebral arteries, basilar artery and bilateral posterior cerebral arteries are patent without proximal hemodynamically significant stenosis. 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 Recommend CTA of the neck to confirm and further characterize. MRA head: No large vessel occlusion or proximal hemodynamically significant stenosis. Electronically Signed   By: Feliberto Harts M.D.   On: 10/29/2023 22:56   MR ANGIO NECK WO CONTRAST Result Date: 10/29/2023 CLINICAL DATA:  Stroke, follow up EXAM: MRA NECK WITHOUT CONTRAST MRA HEAD WITHOUT CONTRAST TECHNIQUE: Angiographic images of the Circle of Willis were acquired using MRA technique without intravenous contrast. COMPARISON:  MRA head/neck 09/17/2021. FINDINGS: MRA NECK FINDINGS Aortic arch: Great vessel origins are patent. Right carotid system: Right ICA appears mildly narrowed. Artifact in this area limits assessment. Left carotid system: Suspected flow limiting stenosis of the left proximal ICA. Artifact in this area limits assessment. Vertebral arteries: Right dominant. Both vertebral arteries are patent without significant stenosis. MRA HEAD FINDINGS Anterior circulation: Bilateral intracranial ICAs, MCAs, and ACAs are patent without proximal hemodynamically significant stenosis. No aneurysm identified. Posterior circulation: Bilateral intradural vertebral arteries, basilar artery and bilateral posterior cerebral arteries are patent without  proximal hemodynamically significant stenosis. 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 Recommend CTA of the neck to confirm and further characterize. MRA head: No  large vessel occlusion or proximal hemodynamically significant stenosis. Electronically Signed   By: Feliberto Harts M.D.   On: 10/29/2023 22:56   MR BRAIN WO CONTRAST Result Date: 10/29/2023 CLINICAL DATA:  Provided history: Neuro deficit, acute, stroke suspected. EXAM: MRI HEAD WITHOUT CONTRAST TECHNIQUE: Multiplanar, multiecho pulse sequences of the brain and surrounding structures were obtained without intravenous contrast. COMPARISON:  Head CT 10/29/2023.  Brain MRI 09/17/2021. FINDINGS: Brain: No age-advanced or lobar predominant cerebral atrophy. 5 mm acute infarct within the dorsal pons on the right. Chronic lacunar infarcts and chronic small ischemic changes elsewhere within the pons, new from the prior brain MRI of 09/17/2021. Chronic lacunar infarcts within the bilateral thalami. Few nonspecific punctate chronic microhemorrhages scattered within the supratentorial brain. No evidence of an intracranial mass. No extra-axial fluid collection. No midline shift. Vascular: Maintained flow voids within the proximal large arterial vessels. Skull and upper cervical spine: No focal worrisome marrow lesion. Incompletely assessed cervical spondylosis. Susceptibility artifact arising from ACDF hardware. Sinuses/Orbits: No mass or acute finding within the imaged orbits. Prior bilateral ocular lens replacement. Mild mucosal thickening within the right maxillary sinus. T1 hyperintense signal throughout much of the left sphenoid sinus which may reflect the presence of inspissated secretions, a mucous retention cyst or a polyp. IMPRESSION: 1. 5 mm acute infarct within the dorsal pons on the right. 2. Chronic lacunar infarcts and chronic small ischemic changes elsewhere within the pons, new from the prior brain MRI of  09/17/2021. 3. Chronic lacunar infarcts within the bilateral thalami, also new from the prior MRI. 4. Few nonspecific punctate chronic microhemorrhages within the supratentorial brain. 5. Mild paranasal sinus disease as described. Electronically Signed   By: Jackey Loge D.O.   On: 10/29/2023 17:58   CT HEAD CODE STROKE WO CONTRAST Result Date: 10/29/2023 CLINICAL DATA:  Code stroke. Neuro deficit, acute, stroke suspected. Dizziness for the last week. Acute onset of left-sided face, upper and lower extremity numbness beginning at 8:30 a.m. today. EXAM: CT HEAD WITHOUT CONTRAST TECHNIQUE: Contiguous axial images were obtained from the base of the skull through the vertex without intravenous contrast. RADIATION DOSE REDUCTION: This exam was performed according to the departmental dose-optimization program which includes automated exposure control, adjustment of the mA and/or kV according to patient size and/or use of iterative reconstruction technique. COMPARISON:  CT head without contrast 09/18/2021. FINDINGS: Brain: No acute infarct, hemorrhage, or mass lesion is present. Mild white matter changes demonstrates some progression. Deep brain nuclei are within normal limits. The ventricles are of normal size. Insular ribbon is normal. No acute or focal cortical abnormality is present. No significant extraaxial fluid collection is present. Midline structures are within normal limits. The brainstem and cerebellum are within normal limits. Vascular: Atherosclerotic calcifications are present within the cavernous ICA bilaterally. No hyperdense vessel is present. Skull: Calvarium is intact. No focal lytic or blastic lesions are present. No significant extracranial soft tissue lesion is present. Sinuses/Orbits: Chronic left sphenoid sinus disease is present. The paranasal sinuses and mastoid air cells are otherwise clear. Bilateral lens replacements are noted. Globes and orbits are otherwise unremarkable. ASPECTS [facility]  Stroke Program Early CT Score) - Ganglionic level infarction (caudate, lentiform nuclei, internal capsule, insula, M1-M3 cortex): 7/7 - Supraganglionic infarction (M4-M6 cortex): 3/3 Total score (0-10 with 10 being normal): 10/10 pelvic IMPRESSION: 1. No acute intracranial abnormality or significant interval change. 2. Mild white matter changes demonstrate some progression. This likely reflects the sequela of chronic microvascular ischemia. 3. Aspects is 10/10. 4.  Chronic left sphenoid sinus disease. These results were called by telephone at the time of interpretation on 10/29/2023 at 12:06 pm to provider MELANIE BELFI , who verbally acknowledged these results. The above was relayed via text pager to Dr. Wilford Corner on 10/29/2023 at 12:06 . Electronically Signed   By: Marin Roberts M.D.   On: 10/29/2023 12:08   Scheduled Meds:  aspirin EC  81 mg Oral Daily   clopidogrel  75 mg Oral Daily   cyanocobalamin  1,000 mcg Oral Daily   enoxaparin (LOVENOX) injection  40 mg Subcutaneous Q24H   ezetimibe  10 mg Oral Daily   [START ON 10/31/2023] insulin aspart  0-15 Units Subcutaneous TID WC   insulin aspart  0-5 Units Subcutaneous QHS   insulin glargine  20 Units Subcutaneous QHS   levothyroxine  200 mcg Oral Q0600   mycophenolate  1,000 mg Oral BID   pantoprazole  40 mg Oral Daily   pregabalin  25 mg Oral BID   rosuvastatin  40 mg Oral Daily   Continuous Infusions:   LOS: 0 days   Marguerita Merles, DO [facility] Available via Epic secure chat 7am-7pm After these hours, please refer to coverage provider listed on [email or web address] 10/30/2023, 8:25 PM

## 2023-10-30 NOTE — Inpatient Diabetes Management (Addendum)
 Inpatient Diabetes Program Recommendations  AACE/ADA: New Consensus Statement on Inpatient Glycemic Control (2015)  Target Ranges:  Prepandial:   less than 140 mg/dL      Peak postprandial:   less than 180 mg/dL (1-2 hours)      Critically ill patients:  140 - 180 mg/dL   Lab Results  Component Value Date   GLUCAP 341 (H) 10/30/2023   HGBA1C 9.2 (H) 10/29/2023    Review of Glycemic Control  Diabetes history: DM2 Outpatient Diabetes medications:        Tresiba 15 units daily       Omnipod Dash 5 insulin pump Humalog with meals        Jardiance 25 mg daily       Bydureon 2 mg weekly                                               Current orders for Inpatient glycemic control: Lantus 15 units at HS, Novolog 0-15 units correction scale TID  Inpatient Diabetes Program Recommendations:   Noted that patient's blood sugars have been elevated. Noted that patient received Lantus 15 units last night. Blood sugar this am fasting was 341 mg/dl and was given Novolog 11 units.   Recommend: Lantus 20 units at HS (titrate up if needed) Novolog 0-15 units correction scale every 4 hours if not eating, TID when eating Add Novolog 0-5 units HS scale  Titrate dosages as needed.  If blood sugars continue to be elevated, recommend adding Novolog 3 units TID with meals if eating at least 50% of meal.   Patient should not be wearing the Omnipod insulin pump at this time. Will continue to monitor blood sugars while in the hospital.  Smith Mince RN BSN CDE Diabetes Coordinator Pager: [phone]  8am-5pm

## 2023-10-31 DIAGNOSIS — I639 Cerebral infarction, unspecified: Secondary | ICD-10-CM | POA: Diagnosis not present

## 2023-10-31 LAB — CBC WITH DIFFERENTIAL/PLATELET
Abs Immature Granulocytes: 0.01 10*3/uL (ref 0.00–0.07)
Basophils Absolute: 0 10*3/uL (ref 0.0–0.1)
Basophils Relative: 1 %
Eosinophils Absolute: 0.1 10*3/uL (ref 0.0–0.5)
Eosinophils Relative: 2 %
HCT: 29.9 % — ABNORMAL LOW (ref 36.0–46.0)
Hemoglobin: 9.7 g/dL — ABNORMAL LOW (ref 12.0–15.0)
Immature Granulocytes: 0 %
Lymphocytes Relative: 29 %
Lymphs Abs: 1.1 10*3/uL (ref 0.7–4.0)
MCH: 30.3 pg (ref 26.0–34.0)
MCHC: 32.4 g/dL (ref 30.0–36.0)
MCV: 93.4 fL (ref 80.0–100.0)
Monocytes Absolute: 0.4 10*3/uL (ref 0.1–1.0)
Monocytes Relative: 11 %
Neutro Abs: 2.1 10*3/uL (ref 1.7–7.7)
Neutrophils Relative %: 57 %
Platelets: 156 10*3/uL (ref 150–400)
RBC: 3.2 MIL/uL — ABNORMAL LOW (ref 3.87–5.11)
RDW: 12.4 % (ref 11.5–15.5)
WBC: 3.7 10*3/uL — ABNORMAL LOW (ref 4.0–10.5)
nRBC: 0 % (ref 0.0–0.2)

## 2023-10-31 LAB — COMPREHENSIVE METABOLIC PANEL
ALT: 17 U/L (ref 0–44)
AST: 21 U/L (ref 15–41)
Albumin: 2.7 g/dL — ABNORMAL LOW (ref 3.5–5.0)
Alkaline Phosphatase: 85 U/L (ref 38–126)
Anion gap: 6 (ref 5–15)
BUN: 17 mg/dL (ref 8–23)
CO2: 22 mmol/L (ref 22–32)
Calcium: 8.8 mg/dL — ABNORMAL LOW (ref 8.9–10.3)
Chloride: 109 mmol/L (ref 98–111)
Creatinine, Ser: 1.06 mg/dL — ABNORMAL HIGH (ref 0.44–1.00)
GFR, Estimated: 57 mL/min — ABNORMAL LOW (ref >60)
Glucose, Bld: 263 mg/dL — ABNORMAL HIGH (ref 70–99)
Potassium: 4.1 mmol/L (ref 3.5–5.1)
Sodium: 137 mmol/L (ref 135–145)
Total Bilirubin: 0.5 mg/dL (ref 0.0–1.2)
Total Protein: 6 g/dL — ABNORMAL LOW (ref 6.5–8.1)

## 2023-10-31 LAB — GLUCOSE, CAPILLARY
Glucose-Capillary: 262 mg/dL — ABNORMAL HIGH (ref 70–99)
Glucose-Capillary: 248 mg/dL — ABNORMAL HIGH (ref 70–99)
Glucose-Capillary: 341 mg/dL — ABNORMAL HIGH (ref 70–99)
Glucose-Capillary: 337 mg/dL — ABNORMAL HIGH (ref 70–99)

## 2023-10-31 LAB — PHOSPHORUS: Phosphorus: 3.4 mg/dL (ref 2.5–4.6)

## 2023-10-31 LAB — MAGNESIUM: Magnesium: 1.7 mg/dL (ref 1.7–2.4)

## 2023-10-31 MED ORDER — INSULIN GLARGINE 100 UNIT/ML ~~LOC~~ SOLN
25.0000 [IU] | Freq: Every day | SUBCUTANEOUS | Status: DC
  Administered 2023-10-31: 25 [IU] via SUBCUTANEOUS
  Filled 2023-10-31 (×2): qty 0.25

## 2023-10-31 NOTE — Plan of Care (Signed)
   Problem: Education: Goal: Knowledge of General Education information will improve Description Including pain rating scale, medication(s)/side effects and non-pharmacologic comfort measures Outcome: Progressing   Problem: Health Behavior/Discharge Planning: Goal: Ability to manage health-related needs will improve Outcome: Progressing

## 2023-10-31 NOTE — Progress Notes (Signed)
 PROGRESS NOTE    [name]  ZOX:[id number] DOB: 12/14/1950 DOA: 10/29/2023 PCP: [name] Reichmann, DO   Chief Complaint  Patient presents with   Numbness    Brief Narrative:  The patient is a 73 year old African-American female with past medical history significant for CVA, history of insulin-dependent diabetes mellitus type 2 on insulin pump, hypertension, hyperlipidemia, myasthenia gravis, OSA, GERD, history of thyroid cancer status post radiation and total thyroidectomy, history of breast cancer status postlumpectomy and adjuvant chemotherapy and radiation who presented to the ED with right-sided weakness and numbness. Further workup was done and she is found to have an acute CVA and undergoing a stroke workup. Neurology consulted and PT OT recommending CIR now.   Patient is currently medically stable for discharge whenever CIR bed is available.  Assessment & Plan:   Principal Problem:   Stroke [facility]) Active Problems:   Essential hypertension, benign   OSA on CPAP   Myasthenia gravis in remission (HCC)   Gastroesophageal reflux disease without esophagitis   Hyperlipidemia associated with type 2 diabetes mellitus (HCC)   Insulin dependent type 2 diabetes mellitus (HCC)    Ischemic infarct, right pontine infarct -Secondary to small vessel disease -MRA Suspected flow-limiting stenosis of the left proximal ICA.  -MRI  5 mm acute infarct within the dorsal pons on the right.  -2D Echo EF 60-65%. LV grade I diastolic dysfunction -US carotid right ICA are consistent with a 40-59% stenosis. Left Carotid: Patent ICA stent with some narrowing noted proximal through mid stent  -LDL 41 -HgbA1c 9.2 -VTE prophylaxis - Lovenox -Neurology input greatly appreciated, she is on aspirin 81 mg oral daily at home, recommendation for dual antiplatelet therapy aspirin and Plavix x 3 weeks then Plavix alone    Essential Hypertension: Home medication include olmesartan 20 g p.o. daily and blood  pressure is now stable and goal blood pressures less than 220/110.   Hyperlipidemia:  LDL is 41 and triglycerides are 164 with her goal being less than 70.  Continue Rosuvastatin 40 mg mg p.o. daily, ezetimibe 10 mg p.o. daily and Repatha   Uncontrolled controlled diabetes mellitus type 2: Insulin pump at home and Jardiance.  Hemoglobin A1c with here was 9.2.  Continuing long-acting with Lantus 20 and moderate NovoLog sounds with insulin before meals and at bedtime.  Will need further adjustments and holding her insulin pump.  CBGs mains elevated, so we will increase her Lantus to 25 units.   Normocytic Anemia/Thrombocytopenia: Hgb/Hct is now gone from 10.8/34.4 -> 8.6/27.1 count dropped from 166 is now 147.  Continue to monitor for signs and symptoms of bleeding; no overt bleeding noted and check anemia panel in a.m. and CBC in a.m.   Renal insufficiency/Metabolic Acidosis: -BUN/Cr Trend: -Is a slight metabolic acidosis with a CO2 of 19, anion gap of 6, chloride level 110 -Avoid Nephrotoxic Medications, Contrast Dyes, Hypotension and Dehydration to Ensure Adequate Renal Perfusion and will need to Renally Adjust Meds -Continue to Monitor and Trend Renal Function carefully and repeat CMP in the AM    Hypothyroidism:  -Check TSH and continue levothyroxine 200 mcg p.o. daily   Ocular Myasthenia Gravis:  -C/w family at 1000 g p.o. twice daily and Rituxumab and gets IVIG q6weeks and follows [facility] as outpatient    HypoK+:  -Replaced   OSA: C/w CPAP   GERD/GI Prophylaxis: - on PPI   Class II Obesity:  Body mass index is 35.36 kg/m.    DVT prophylaxis: Lovenox Code Status: Full code  Family Communication: None at bedside Disposition:   Status is: Observation    Consultants:  Neurology Rehab   Subjective: Reporting significant right-sided weakness Objective: Vitals:   10/30/23 2005 10/30/23 2340 10/31/23 0400 10/31/23 0800  BP: (!) 150/57 (!) 152/52  (!) 151/66  Pulse:    75   Resp:    12  Temp: 97.8 F (36.6 C) 98 F (36.7 C) 97.9 F (36.6 C) 98 F (36.7 C)  TempSrc: Oral Oral Axillary Oral  SpO2:    95%  Weight:       No intake or output data in the 24 hours ending 10/31/23 1449 Filed Weights   10/29/23 1158  Weight: 90.5 kg    Examination:  Awake Alert, Oriented X 3, she has right upper and lower extremity weakness Symmetrical Chest wall movement, Good air movement bilaterally, CTAB RRR,No Gallops,Rubs or new Murmurs, No Parasternal Heave +ve B.Sounds, Abd Soft, No tenderness, No rebound - guarding or rigidity. No Cyanosis, Clubbing or edema, No new Rash or bruise       Data Reviewed: I have personally reviewed following labs and imaging studies  CBC: Recent Labs  Lab 10/29/23 1155 10/30/23 0248 10/31/23 0622  WBC 6.2 5.0 3.7*  NEUTROABS 4.8  --  2.1  HGB 10.8* 8.6* 9.7*  HCT 34.4* 27.1* 29.9*  MCV 93.5 96.4 93.4  PLT 166 147* 156    Basic Metabolic Panel: Recent Labs  Lab 10/29/23 1155 10/30/23 0248 10/31/23 0622  NA 141 135 137  K 3.3* 4.6 4.1  CL 112* 110 109  CO2 21* 19* 22  GLUCOSE 196* 390* 263*  BUN 18 20 17   CREATININE 0.89 1.15* 1.06*  CALCIUM 8.8* 8.2* 8.8*  MG  --   --  1.7  PHOS  --   --  3.4    GFR: Estimated Creatinine Clearance: 52.7 mL/min (A) (by C-G formula based on SCr of 1.06 mg/dL (H)).  Liver Function Tests: Recent Labs  Lab 10/29/23 1155 10/31/23 0622  AST 25 21  ALT 22 17  ALKPHOS 84 85  BILITOT 0.5 0.5  PROT 7.6 6.0*  ALBUMIN 3.2* 2.7*    CBG: Recent Labs  Lab 10/30/23 1250 10/30/23 1707 10/30/23 2110 10/31/23 0741 10/31/23 1150  GLUCAP 322* 290* 312* 262* 248*     No results found for this or any previous visit (from the past 240 hours).       Radiology Studies: VAS US CAROTID Result Date: 10/30/2023 Carotid Arterial Duplex Study Patient Name:  [name]  Date of Exam:   10/30/2023 Medical Rec #: [id number]        Accession #:    [id number] Date of Birth:  September 15, 1950         Patient Gender: F Patient Age:   43 years Exam Location:  [facility] Procedure:      VAS US CAROTID Referring Phys: Elmer Picker --------------------------------------------------------------------------------  Indications:       CVA, Carotid artery disease and Bilateral stents--Left TCAR                    09/24/21 Left TCAR 10/23/21. Risk Factors:      Hypertension, hyperlipidemia, Diabetes, prior CVA. Other Factors:     Myasthenia Gravis, Tyroid cancer with radiation 2005 and                    thyroidectomy 2014, Breast cancer with chemo 12/23. Comparison Study:  Prior carotid duplex done 12/09/21 indicated  patent bilateral                    ICA stents. Performing Technologist: Sherren Kerns RVS  Examination Guidelines: A complete evaluation includes B-mode imaging, spectral Doppler, color Doppler, and power Doppler as needed of all accessible portions of each vessel. Bilateral testing is considered an integral part of a complete examination. Limited examinations for reoccurring indications may be performed as noted.  Right Carotid Findings: +----------+--------+--------+--------+------------------+------------------+           PSV cm/sEDV cm/sStenosisPlaque DescriptionComments           +----------+--------+--------+--------+------------------+------------------+ CCA Prox  132     12                                                   +----------+--------+--------+--------+------------------+------------------+ CCA Distal90      12                                intimal thickening +----------+--------+--------+--------+------------------+------------------+ ICA Prox                                            stent              +----------+--------+--------+--------+------------------+------------------+ ICA Mid                                             stent              +----------+--------+--------+--------+------------------+------------------+ ICA  Distal151     30                                                   +----------+--------+--------+--------+------------------+------------------+ ECA       257     5                                                    +----------+--------+--------+--------+------------------+------------------+ +----------+--------+-------+--------+-------------------+           PSV cm/sEDV cmsDescribeArm Pressure (mmHG) +----------+--------+-------+--------+-------------------+ Subclavian109                                        +----------+--------+-------+--------+-------------------+ +---------+--------+--+--------+--+---------+ VertebralPSV cm/s94EDV cm/s16Antegrade +---------+--------+--+--------+--+---------+  Right Stent(s): +---------------+---+--+++------------------------+ Prox to Stent  74 15                         +---------------+---+--+++------------------------+ Proximal Stent 80 17                         +---------------+---+--+++------------------------+ Mid Stent      91 12acoustic shadowing noted +---------------+---+--+++------------------------+ Distal Stent   11514                         +---------------+---+--+++------------------------+  Distal to [phone]                         +---------------+---+--+++------------------------+   Left Carotid Findings: +----------+--------+--------+--------+------------------+--------+           PSV cm/sEDV cm/sStenosisPlaque DescriptionComments +----------+--------+--------+--------+------------------+--------+ CCA Prox  109     9                                          +----------+--------+--------+--------+------------------+--------+ CCA Distal87      10              heterogenous               +----------+--------+--------+--------+------------------+--------+ ICA Prox                                            stent     +----------+--------+--------+--------+------------------+--------+ ICA Mid                                             stent    +----------+--------+--------+--------+------------------+--------+ ICA Distal131     26                                         +----------+--------+--------+--------+------------------+--------+ ECA       279     4                                          +----------+--------+--------+--------+------------------+--------+ +----------+--------+--------+--------+-------------------+           PSV cm/sEDV cm/sDescribeArm Pressure (mmHG) +----------+--------+--------+--------+-------------------+ [id number]                                         +----------+--------+--------+--------+-------------------+ +---------+--------+--+--------+--+---------+ VertebralPSV cm/s71EDV cm/s13Antegrade +---------+--------+--+--------+--+---------+  Left Stent(s): +---------------+---+--+++---------+ Prox to Stent  67 8           +---------------+---+--+++---------+ Proximal Stent 12525narrowing +---------------+---+--+++---------+ Mid Stent      13118narrowing +---------------+---+--+++---------+ Distal Stent   11422          +---------------+---+--+++---------+ Distal to Stent99 17          +---------------+---+--+++---------+    Summary: Right Carotid: Velocities in the right ICA are consistent with a 40-59%                stenosis. Patent ICA stent with acoustic shadowing noted mid                stent. Velocities within stent have increased since prior study                done 12/09/2021. ECA velocities have increased from 152 to 257                with change in waveform since prior study. Left Carotid: Patent ICA stent with some narrowing noted proximal through mid  stent. Velocities within the stent have increased since prior               study done 12/09/21. ECA velocities have increased from 108 to 279                with change in waveform since prior study. Vertebrals:  Bilateral vertebral arteries demonstrate antegrade flow. Subclavians: Normal flow hemodynamics were seen in bilateral subclavian              arteries. *See table(s) above for measurements and observations.  Electronically signed by Delia Heady MD on 10/30/2023 at 12:33:17 PM.    Final    ECHOCARDIOGRAM COMPLETE Result Date: 10/30/2023    ECHOCARDIOGRAM REPORT   Patient Name:   [name] Date of Exam: 10/30/2023 Medical Rec #:  [id number]       Height:       63.0 in Accession #:    [id number]      Weight:       199.6 lb Date of Birth:  August 25, 1950        BSA:          1.932 m Patient Age:    72 years        BP:           113/46 mmHg Patient Gender: F               HR:           91 bpm. Exam Location:  Inpatient Procedure: 2D Echo (Both Spectral and Color Flow Doppler were utilized during            procedure). Indications:    stroke  History:        Patient has prior history of Echocardiogram examinations, most                 recent 09/17/2021. Risk Factors:Sleep Apnea, Dyslipidemia,                 Diabetes and Hypertension.  Sonographer:    Delcie Roch RDCS Referring Phys: [id number] SAGAR H JINWALA IMPRESSIONS  1. Left ventricular ejection fraction, by estimation, is 60 to 65%. The left ventricle has normal function. The left ventricle has no regional wall motion abnormalities. Left ventricular diastolic parameters are consistent with Grade I diastolic dysfunction (impaired relaxation).  2. Right ventricular systolic function is normal. The right ventricular size is normal. There is normal pulmonary artery systolic pressure.  3. The mitral valve is abnormal. Trivial mitral valve regurgitation. No evidence of mitral stenosis. Severe mitral annular calcification.  4. The aortic valve is tricuspid. There is moderate calcification of the aortic valve. There is moderate thickening of the aortic valve. Aortic valve regurgitation is not visualized.  Aortic valve sclerosis/calcification is present, without any evidence of aortic stenosis.  5. The inferior vena cava is normal in size with greater than 50% respiratory variability, suggesting right atrial pressure of 3 mmHg. FINDINGS  Left Ventricle: Left ventricular ejection fraction, by estimation, is 60 to 65%. The left ventricle has normal function. The left ventricle has no regional wall motion abnormalities. Strain was performed and the global longitudinal strain is indeterminate. The left ventricular internal cavity size was normal in size. There is no left ventricular hypertrophy. Left ventricular diastolic parameters are consistent with Grade I diastolic dysfunction (impaired relaxation). Right Ventricle: The right ventricular size is normal. No increase in right ventricular wall thickness. Right ventricular systolic function is normal. There is normal  pulmonary artery systolic pressure. The tricuspid regurgitant velocity is 2.76 m/s, and  with an assumed right atrial pressure of 3 mmHg, the estimated right ventricular systolic pressure is 33.5 mmHg. Left Atrium: Left atrial size was normal in size. Right Atrium: Right atrial size was normal in size. Pericardium: There is no evidence of pericardial effusion. Mitral Valve: The mitral valve is abnormal. There is mild thickening of the mitral valve leaflet(s). There is mild calcification of the mitral valve leaflet(s). Severe mitral annular calcification. Trivial mitral valve regurgitation. No evidence of mitral valve stenosis. Tricuspid Valve: The tricuspid valve is normal in structure. Tricuspid valve regurgitation is not demonstrated. No evidence of tricuspid stenosis. Aortic Valve: The aortic valve is tricuspid. There is moderate calcification of the aortic valve. There is moderate thickening of the aortic valve. Aortic valve regurgitation is not visualized. Aortic valve sclerosis/calcification is present, without any  evidence of aortic stenosis.  Pulmonic Valve: The pulmonic valve was normal in structure. Pulmonic valve regurgitation is trivial. No evidence of pulmonic stenosis. Aorta: The aortic root is normal in size and structure. Venous: The inferior vena cava is normal in size with greater than 50% respiratory variability, suggesting right atrial pressure of 3 mmHg. IAS/Shunts: No atrial level shunt detected by color flow Doppler. Additional Comments: 3D was performed not requiring image post processing on an independent workstation and was indeterminate.  LEFT VENTRICLE PLAX 2D LVIDd:         3.80 cm Diastology LVIDs:         2.00 cm LV e' medial:    6.53 cm/s LV PW:         0.90 cm LV E/e' medial:  15.8 LV IVS:        1.00 cm LV e' lateral:   8.38 cm/s                        LV E/e' lateral: 12.3  RIGHT VENTRICLE          IVC RV Basal diam:  2.30 cm  IVC diam: 1.90 cm TAPSE (M-mode): 1.8 cm LEFT ATRIUM             Index        RIGHT ATRIUM           Index LA diam:        3.00 cm 1.55 cm/m   RA Area:     10.50 cm LA Vol (A2C):   33.3 ml 17.24 ml/m  RA Volume:   21.20 ml  10.97 ml/m LA Vol (A4C):   43.1 ml 22.31 ml/m LA Biplane Vol: 39.7 ml 20.55 ml/m  AORTIC VALVE LVOT Vmax:   126.00 cm/s LVOT Vmean:  83.000 cm/s LVOT VTI:    0.212 m  AORTA Ao Root diam: 3.10 cm Ao Asc diam:  3.00 cm MITRAL VALVE                TRICUSPID VALVE MV Area (PHT): 3.19 cm     TR Peak grad:   30.5 mmHg MV Decel Time: 238 msec     TR Vmax:        276.00 cm/s MV E velocity: 103.00 cm/s MV A velocity: 168.00 cm/s  SHUNTS MV E/A ratio:  0.61         Systemic VTI: 0.21 m Charlton Haws MD Electronically signed by Charlton Haws MD Signature Date/Time: 10/30/2023/11:17:22 AM    Final    MR ANGIO HEAD WO CONTRAST Result Date:  10/29/2023 CLINICAL DATA:  Stroke, follow up EXAM: MRA NECK WITHOUT CONTRAST MRA HEAD WITHOUT CONTRAST TECHNIQUE: Angiographic images of the Circle of Willis were acquired using MRA technique without intravenous contrast. COMPARISON:  MRA head/neck  09/17/2021. FINDINGS: MRA NECK FINDINGS Aortic arch: Great vessel origins are patent. Right carotid system: Right ICA appears mildly narrowed. Artifact in this area limits assessment. Left carotid system: Suspected flow limiting stenosis of the left proximal ICA. Artifact in this area limits assessment. Vertebral arteries: Right dominant. Both vertebral arteries are patent without significant stenosis. MRA HEAD FINDINGS Anterior circulation: Bilateral intracranial ICAs, MCAs, and ACAs are patent without proximal hemodynamically significant stenosis. No aneurysm identified. Posterior circulation: Bilateral intradural vertebral arteries, basilar artery and bilateral posterior cerebral arteries are patent without proximal hemodynamically significant stenosis. 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 Recommend CTA of the neck to confirm and further characterize. MRA head: No large vessel occlusion or proximal hemodynamically significant stenosis. Electronically Signed   By: Feliberto Harts M.D.   On: 10/29/2023 22:56   MR ANGIO NECK WO CONTRAST Result Date: 10/29/2023 CLINICAL DATA:  Stroke, follow up EXAM: MRA NECK WITHOUT CONTRAST MRA HEAD WITHOUT CONTRAST TECHNIQUE: Angiographic images of the Circle of Willis were acquired using MRA technique without intravenous contrast. COMPARISON:  MRA head/neck 09/17/2021. FINDINGS: MRA NECK FINDINGS Aortic arch: Great vessel origins are patent. Right carotid system: Right ICA appears mildly narrowed. Artifact in this area limits assessment. Left carotid system: Suspected flow limiting stenosis of the left proximal ICA. Artifact in this area limits assessment. Vertebral arteries: Right dominant. Both vertebral arteries are patent without significant stenosis. MRA HEAD FINDINGS Anterior circulation: Bilateral intracranial ICAs, MCAs, and ACAs are patent without proximal hemodynamically significant stenosis. No aneurysm identified.  Posterior circulation: Bilateral intradural vertebral arteries, basilar artery and bilateral posterior cerebral arteries are patent without proximal hemodynamically significant stenosis. 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 Recommend CTA of the neck to confirm and further characterize. MRA head: No large vessel occlusion or proximal hemodynamically significant stenosis. Electronically Signed   By: Feliberto Harts M.D.   On: 10/29/2023 22:56   MR BRAIN WO CONTRAST Result Date: 10/29/2023 CLINICAL DATA:  Provided history: Neuro deficit, acute, stroke suspected. EXAM: MRI HEAD WITHOUT CONTRAST TECHNIQUE: Multiplanar, multiecho pulse sequences of the brain and surrounding structures were obtained without intravenous contrast. COMPARISON:  Head CT 10/29/2023.  Brain MRI 09/17/2021. FINDINGS: Brain: No age-advanced or lobar predominant cerebral atrophy. 5 mm acute infarct within the dorsal pons on the right. Chronic lacunar infarcts and chronic small ischemic changes elsewhere within the pons, new from the prior brain MRI of 09/17/2021. Chronic lacunar infarcts within the bilateral thalami. Few nonspecific punctate chronic microhemorrhages scattered within the supratentorial brain. No evidence of an intracranial mass. No extra-axial fluid collection. No midline shift. Vascular: Maintained flow voids within the proximal large arterial vessels. Skull and upper cervical spine: No focal worrisome marrow lesion. Incompletely assessed cervical spondylosis. Susceptibility artifact arising from ACDF hardware. Sinuses/Orbits: No mass or acute finding within the imaged orbits. Prior bilateral ocular lens replacement. Mild mucosal thickening within the right maxillary sinus. T1 hyperintense signal throughout much of the left sphenoid sinus which may reflect the presence of inspissated secretions, a mucous retention cyst or a polyp. IMPRESSION: 1. 5 mm acute infarct within the dorsal  pons on the right. 2. Chronic lacunar infarcts and chronic small ischemic changes elsewhere within the pons, new from the prior brain MRI of  09/17/2021. 3. Chronic lacunar infarcts within the bilateral thalami, also new from the prior MRI. 4. Few nonspecific punctate chronic microhemorrhages within the supratentorial brain. 5. Mild paranasal sinus disease as described. Electronically Signed   By: Jackey Loge D.O.   On: 10/29/2023 17:58        Scheduled Meds:  aspirin EC  81 mg Oral Daily   clopidogrel  75 mg Oral Daily   cyanocobalamin  1,000 mcg Oral Daily   enoxaparin (LOVENOX) injection  40 mg Subcutaneous Q24H   ezetimibe  10 mg Oral Daily   insulin aspart  0-15 Units Subcutaneous TID WC   insulin aspart  0-5 Units Subcutaneous QHS   insulin glargine  20 Units Subcutaneous QHS   levothyroxine  200 mcg Oral Q0600   mycophenolate  1,000 mg Oral BID   pantoprazole  40 mg Oral Daily   pregabalin  25 mg Oral BID   rosuvastatin  40 mg Oral Daily   Continuous Infusions:   LOS: 0 days       Huey Bienenstock, MD [facility]   To contact the attending provider between 7A-7P or the covering provider during after hours 7P-7A, please log into the web site [email or web address] and access using universal  password for that web site. If you do not have the password, please call the hospital operator.  10/31/2023, 2:49 PM

## 2023-10-31 NOTE — PMR Pre-admission (Shared)
 PMR Admission Coordinator Pre-Admission Assessment  Patient: [name] is an 73 y.o., female MRN: [id number] DOB: [birth year] Height:   Weight: 90.5 kg  Insurance Information HMO: yes    PPO:      PCP:      IPA:      80/20:      OTHER:  PRIMARY: U[employer] Medicare      Policy#: [id number]      Subscriber: patient CM Name: ***      Phone#: ***     Fax#: [phone] Pre-Cert#: [id number]      Employer:  Benefits:  Phone #: online-[phone]     Name:  Eff. Date: 08/25/23     Deduct: does not have one      Out of Pocket Max: $3,900 ($36.49 met)      Life Max: NA CIR: $295/day co-pay for days 1-5, 100% coverage for days 6+      SNF: 100% coverage for days 1-20, $203 co-pay/day for days 21-100 Outpatient: $20/visit co-pay     Co-Pay:  Home Health: 100% coverage      Co-Pay:  DME: 80% coverage     Co-Pay: 20% co-insurance Providers: in-network SECONDARY:       Policy#:      Phone#:   Artist:       Phone#:   The Data processing manager" for patients in Inpatient Rehabilitation Facilities with attached "Privacy Act Statement-Health Care Records" was provided and verbally reviewed with: {CHL IP Patient Family GL:[id number]}  Emergency Contact Information Contact Information     Name Relation Home Work Mobile   [place] Sister   [phone]      Other Contacts   None on File     Current Medical History  Patient Admitting Diagnosis: CVA History of Present Illness: Pt is a  73 year old female with medical hx significant for: HTN, hypothyroidism, breast CA, myasthenia gravis, chronic headaches, diabetes type II, GERD, prior left hemispheric TIA and left thalamic infarct. Pt presented to ED at [facility] on 10/29/23 d/t right facial numbness and numbness on right arm and leg. Pt reported vertigo x1 week and blurry vision, double vision. Code stroke activated. Pt not a candidate for IV thrombolysis. Neurology consulted. Pt transferred to  [facility] on 10/29/23. MRI revealed right pontine stroke. Also noted left upper quadrant visual field cut. MRA suspicious for flow-limiting stenosis of left proximal ICA. Therapy evaluations completed and CIR recommended d/t pt's deficits in functional mobility.  Complete NIHSS TOTAL: 4  Patient's medical record from [facility] has been reviewed by the rehabilitation admission coordinator and physician.  Past Medical History  Past Medical History:  Diagnosis Date   Arthritis    "all over my body" (03/13/2013)   Asthma    Asymptomatic carotid artery stenosis, bilateral 10/08/2018   Breast cancer (HCC)    GERD (gastroesophageal reflux disease)    H/O hiatal hernia    Headache    pt states she has had headaches for about 6 months "off and on"   Heart murmur    pt had echocardiogram on 09/17/21   Hypercholesterolemia 10/08/2018   Hypertension    Hypothyroidism    Myasthenia gravis (HCC)    "in my eyes; diagnsosed > 7 yr ago" (03/13/2013)   Sleep apnea    on CPAP   Stroke (HCC) 2021   Thyroid carcinoma (HCC)    Type II diabetes mellitus (HCC)     Has the patient had major  surgery during 100 days prior to admission? No  Family History   family history includes Asthma in her father and mother; Congestive Heart Failure in her father and mother; Coronary artery disease in her sister; Diabetes in her brother, father, mother, and sister; Lung cancer in her father; Stroke (age of onset: 40) in her sister.  Current Medications  Current Facility-Administered Medications:    acetaminophen (TYLENOL) tablet 650 mg, 650 mg, Oral, Q6H PRN, 650 mg at 11/01/23 1551 **OR** acetaminophen (TYLENOL) suppository 650 mg, 650 mg, Rectal, Q6H PRN, Briscoe Burns, MD   albuterol (PROVENTIL) (2.5 MG/3ML) 0.083% nebulizer solution 3 mL, 3 mL, Inhalation, Q6H PRN, Briscoe Burns, MD   aspirin EC tablet 81 mg, 81 mg, Oral, Daily, Briscoe Burns, MD, 81 mg at 11/01/23 1610   clopidogrel  (PLAVIX) tablet 75 mg, 75 mg, Oral, Daily, Briscoe Burns, MD, 75 mg at 11/01/23 1021   cyanocobalamin (VITAMIN B12) tablet 1,000 mcg, 1,000 mcg, Oral, Daily, Briscoe Burns, MD, 1,000 mcg at 11/01/23 0823   enoxaparin (LOVENOX) injection 40 mg, 40 mg, Subcutaneous, Q24H, Briscoe Burns, MD, 40 mg at 10/31/23 2103   ezetimibe (ZETIA) tablet 10 mg, 10 mg, Oral, Daily, Briscoe Burns, MD, 10 mg at 11/01/23 9604   insulin aspart (novoLOG) injection 0-15 Units, 0-15 Units, Subcutaneous, TID WC, Sheikh, Omair Latif, DO, 8 Units at 11/01/23 1327   insulin aspart (novoLOG) injection 0-5 Units, 0-5 Units, Subcutaneous, QHS, Sheikh, Omair Verona, DO, 4 Units at 10/31/23 2102   insulin aspart (novoLOG) injection 5 Units, 5 Units, Subcutaneous, TID WC, Elgergawy, Leana Roe, MD, 5 Units at 11/01/23 1327   insulin glargine (LANTUS) injection 20 Units, 20 Units, Subcutaneous, QHS, Elgergawy, Leana Roe, MD   [START ON 11/02/2023] insulin glargine (LANTUS) injection 25 Units, 25 Units, Subcutaneous, Daily, Elgergawy, Leana Roe, MD   [START ON 11/02/2023] levothyroxine (SYNTHROID) tablet 100 mcg, 100 mcg, Oral, Q0600, Elgergawy, Leana Roe, MD   montelukast (SINGULAIR) tablet 10 mg, 10 mg, Oral, BH-q7a, Elgergawy, Leana Roe, MD, 10 mg at 11/01/23 1021   mycophenolate (CELLCEPT) capsule 1,000 mg, 1,000 mg, Oral, BID, Briscoe Burns, MD, 1,000 mg at 11/01/23 0823   ondansetron [facility]) injection 4 mg, 4 mg, Intravenous, Q6H PRN, Marland Mcalpine, Omair Latif, DO, 4 mg at 11/01/23 1552   pantoprazole (PROTONIX) EC tablet 40 mg, 40 mg, Oral, Daily, Briscoe Burns, MD, 40 mg at 11/01/23 5409   predniSONE (DELTASONE) tablet 7.5 mg, 7.5 mg, Oral, q morning, Elgergawy, Leana Roe, MD, 7.5 mg at 11/01/23 1021   pregabalin (LYRICA) capsule 25 mg, 25 mg, Oral, BID, Briscoe Burns, MD, 25 mg at 11/01/23 8119   rosuvastatin (CRESTOR) tablet 40 mg, 40 mg, Oral, Daily, Briscoe Burns, MD, 40 mg at 11/01/23 1478   tamoxifen (NOLVADEX)  tablet 20 mg, 20 mg, Oral, Daily, Elgergawy, Leana Roe, MD, 20 mg at 11/01/23 1022   Vitamin D (Ergocalciferol) (DRISDOL) 1.25 MG (50000 UNIT) capsule 50,000 Units, 50,000 Units, Oral, Q Mon, Elgergawy, Leana Roe, MD, 50,000 Units at 11/01/23 1023  Patients Current Diet:  Diet Order             Diet Carb Modified Fluid consistency: Thin; Room service appropriate? Yes  Diet effective now                   Precautions / Restrictions Precautions Precautions: Fall Restrictions Weight Bearing Restrictions Per Provider Order: No   Has the patient had 2  or more falls or a fall with injury in the past year? No  Prior Activity Level Limited Community (1-2x/wk): leaves house for doctors' appointments  Prior Functional Level Self Care: Did the patient need help bathing, dressing, using the toilet or eating? Independent  Indoor Mobility: Did the patient need assistance with walking from room to room (with or without device)? Independent  Stairs: Did the patient need assistance with internal or external stairs (with or without device)? Independent  Functional Cognition: Did the patient need help planning regular tasks such as shopping or remembering to take medications? Independent  Patient Information Are you of Hispanic, Latino/a,or Spanish origin?: A. No, not of Hispanic, Latino/a, or Spanish origin What is your race?: B. Black or African American Do you need or want an interpreter to communicate with a doctor or health care staff?: 0. No  Patient's Response To:  Health Literacy and Transportation Is the patient able to respond to health literacy and transportation needs?: Yes Health Literacy - How often do you need to have someone help you when you read instructions, pamphlets, or other written material from your doctor or pharmacy?: Never In the past 12 months, has lack of transportation kept you from medical appointments or from getting medications?: No In the past 12 months, has  lack of transportation kept you from meetings, work, or from getting things needed for daily living?: No  Home Assistive Devices / Equipment Home Equipment: Agricultural consultant (2 wheels), Tub bench, Grab bars - tub/shower  Prior Device Use: Indicate devices/aids used by the patient prior to current illness, exacerbation or injury? Walker  Current Functional Level Cognition  Orientation Level: Oriented X4    Extremity Assessment (includes Sensation/Coordination)  Upper Extremity Assessment: RUE deficits/detail, Right hand dominant (LUE WFL) RUE Deficits / Details: Generally weak with weak/loose grasp, R shoulder AROM of 90 degrees and maintain further ROM when passively positioned. Impaired 2 point discrimination as well throughout forearm but intact in hand. RUE overall 3/5 moves against gravity but not tolerable to resistance RUE: Shoulder pain with ROM RUE Sensation: decreased light touch, decreased proprioception RUE Coordination: decreased fine motor, decreased gross motor  Lower Extremity Assessment: RLE deficits/detail RLE Deficits / Details: AAROM WFL, strength hip flexion 2-/5, knee extension 3+/5, ankle DF 2/5 RLE Sensation: decreased light touch RLE Coordination: decreased gross motor    ADLs  Overall ADL's : Needs assistance/impaired Eating/Feeding: Sitting, Minimal assistance Eating/Feeding Details (indicate cue type and reason): able to use LUE to feed, gave pt red tubing for built up handles when needed for RUE Grooming: Wash/dry face, Standing, Contact guard assist Grooming Details (indicate cue type and reason): incorporating bilat intergation Upper Body Bathing: Sitting, Set up Lower Body Bathing: Sitting/lateral leans, Minimal assistance Upper Body Dressing : Sitting, Moderate assistance Upper Body Dressing Details (indicate cue type and reason): assists with RUE Lower Body Dressing: Moderate assistance, Sitting/lateral leans Toilet Transfer: Minimal assistance,  Rolling walker (2 wheels), Ambulation Toileting- Clothing Manipulation and Hygiene: Sit to/from stand, Minimal assistance Functional mobility during ADLs: Minimal assistance, Rolling walker (2 wheels)    Mobility  Overal bed mobility: Needs Assistance Bed Mobility: Supine to Sit, Sit to Supine Supine to sit: Min assist Sit to supine: Min assist General bed mobility comments: Min A to assist BLEs to EOB and return to bed    Transfers  Overall transfer level: Needs assistance Equipment used: Rolling walker (2 wheels) Transfers: Sit to/from Stand Sit to Stand: Min assist General transfer comment: up from tall stretcher  in ED    Ambulation / Gait / Stairs / Wheelchair Mobility  Ambulation/Gait Ambulation/Gait assistance: Min assist, Mod assist Gait Distance (Feet): 20 Feet Assistive device: Rolling walker (2 wheels) Gait Pattern/deviations: Step-to pattern, Decreased step length - right, Decreased stride length, Drifts right/left, Decreased dorsiflexion - right General Gait Details: assist to progress the R LE, assist for balance and walker management on turns    Posture / Balance Balance Overall balance assessment: Needs assistance Sitting balance-Leahy Scale: Good Standing balance support: Bilateral upper extremity supported Standing balance-Leahy Scale: Poor Standing balance comment: UE support and CGA for balance in static standing    Special needs/care consideration Diabetic management Novolog 0-5 units daily at bedtime, Novolog 0-15 units 3x daily with meals, Lantus 20 units daily at bedtime   Previous Home Environment (from acute therapy documentation) Living Arrangements: Alone Available Help at Discharge: Family, Available 24 hours/day Type of Home: House Home Layout: One level Home Access: Stairs to enter Entrance Stairs-Rails: None Entrance Stairs-Number of Steps: 2 Bathroom Shower/Tub: Engineer, manufacturing systems: Standard Bathroom Accessibility: Yes How  Accessible: Accessible via walker Home Care Services: No  Discharge Living Setting Plans for Discharge Living Setting: Patient's home Type of Home at Discharge: House Discharge Home Layout: One level Discharge Home Access: Stairs to enter Entrance Stairs-Rails: None Entrance Stairs-Number of Steps: 2 Discharge Bathroom Shower/Tub: Tub/shower unit Discharge Bathroom Toilet: Standard Discharge Bathroom Accessibility: Yes How Accessible: Accessible via walker Does the patient have any problems obtaining your medications?: Yes (Describe) (expensive)  Social/Family/Support Systems Anticipated Caregiver: [name] (sister) and other siblings Anticipated Caregiver's Contact Information: Darlene: [phone] Caregiver Availability: 24/7 Discharge Plan Discussed with Primary Caregiver: Yes Is Caregiver In Agreement with Plan?: Yes Does Caregiver/Family have Issues with Lodging/Transportation while Pt is in Rehab?: No  Goals Patient/Family Goal for Rehab: Supervison: PT/OT Expected length of stay: 7-10 days Pt/Family Agrees to Admission and willing to participate: Yes Program Orientation Provided & Reviewed with Pt/Caregiver Including Roles  & Responsibilities: Yes  Decrease burden of Care through IP rehab admission: NA  Possible need for SNF placement upon discharge: Not anticipated  Patient Condition: {PATIENT'S CONDITION:22832}  Preadmission Screen Completed By:  Domingo Pulse, 11/01/2023 4:19 PM ______________________________________________________________________   Discussed status with Dr. Marland Kitchen on *** at *** and received approval for admission today.  Admission Coordinator:  Domingo Pulse, CCC-SLP, time ***/Date ***   Assessment/Plan: Diagnosis: *** Does the need for close, 24 hr/day Medical supervision in concert with the patient's rehab needs make it unreasonable for this patient to be served in a less intensive setting?  {yes_no_potentially:3041433} Co-Morbidities requiring supervision/potential complications: *** Due to {due IH:4742595}, does the patient require 24 hr/day rehab nursing? {yes_no_potentially:3041433} Does the patient require coordinated care of a physician, rehab nurse, PT, OT, and SLP to address physical and functional deficits in the context of the above medical diagnosis(es)? {yes_no_potentially:3041433} Addressing deficits in the following areas: {deficits:3041436} Can the patient actively participate in an intensive therapy program of at least 3 hrs of therapy 5 days a week? {yes_no_potentially:3041433} The potential for patient to make measurable gains while on inpatient rehab is {potential:3041437} Anticipated functional outcomes upon discharge from inpatient rehab: {functional outcomes:304600100} PT, {functional outcomes:304600100} OT, {functional outcomes:304600100} SLP Estimated rehab length of stay to reach the above functional goals is: *** Anticipated discharge destination: {anticipated dc setting:21604} 10. Overall Rehab/Functional Prognosis: {potential:3041437}   MD Signature: ***

## 2023-10-31 NOTE — Progress Notes (Signed)
 Inpatient Rehab Admissions:  Inpatient Rehab Consult received.  I met with patient and sister [name] at the bedside for rehabilitation assessment and to discuss goals and expectations of an inpatient rehab admission.  Discussed average length of stay, insurance authorization requirement and discharge home after completion of CIR. Both acknowledged understanding. Pt interested in pursuing CIR. Darlene supportive. Pt and Darlene confirmed that family will be able to provide 24/7 support for pt after discharge.   Signed: Wolfgang Phoenix, MS, CCC-SLP Admissions Coordinator [phone]

## 2023-10-31 NOTE — Plan of Care (Signed)

## 2023-11-01 DIAGNOSIS — I10 Essential (primary) hypertension: Secondary | ICD-10-CM | POA: Diagnosis not present

## 2023-11-01 DIAGNOSIS — E119 Type 2 diabetes mellitus without complications: Secondary | ICD-10-CM | POA: Diagnosis not present

## 2023-11-01 DIAGNOSIS — I639 Cerebral infarction, unspecified: Secondary | ICD-10-CM | POA: Diagnosis not present

## 2023-11-01 DIAGNOSIS — G7000 Myasthenia gravis without (acute) exacerbation: Secondary | ICD-10-CM | POA: Diagnosis not present

## 2023-11-01 LAB — TSH: TSH: 0.012 u[IU]/mL — ABNORMAL LOW (ref 0.350–4.500)

## 2023-11-01 LAB — GLUCOSE, CAPILLARY
Glucose-Capillary: 271 mg/dL — ABNORMAL HIGH (ref 70–99)
Glucose-Capillary: 269 mg/dL — ABNORMAL HIGH (ref 70–99)
Glucose-Capillary: 336 mg/dL — ABNORMAL HIGH (ref 70–99)
Glucose-Capillary: 308 mg/dL — ABNORMAL HIGH (ref 70–99)

## 2023-11-01 LAB — BASIC METABOLIC PANEL
Anion gap: 8 (ref 5–15)
BUN: 16 mg/dL (ref 8–23)
CO2: 24 mmol/L (ref 22–32)
Calcium: 9.2 mg/dL (ref 8.9–10.3)
Chloride: 106 mmol/L (ref 98–111)
Creatinine, Ser: 1.08 mg/dL — ABNORMAL HIGH (ref 0.44–1.00)
GFR, Estimated: 55 mL/min — ABNORMAL LOW (ref >60)
Glucose, Bld: 296 mg/dL — ABNORMAL HIGH (ref 70–99)
Potassium: 4 mmol/L (ref 3.5–5.1)
Sodium: 138 mmol/L (ref 135–145)

## 2023-11-01 LAB — T4, FREE: Free T4: 2.22 ng/dL — ABNORMAL HIGH (ref 0.61–1.12)

## 2023-11-01 MED ORDER — INSULIN GLARGINE 100 UNIT/ML ~~LOC~~ SOLN
18.0000 [IU] | Freq: Once | SUBCUTANEOUS | Status: AC
  Administered 2023-11-01: 18 [IU] via SUBCUTANEOUS
  Filled 2023-11-01: qty 0.18

## 2023-11-01 MED ORDER — LEVOTHYROXINE SODIUM 100 MCG PO TABS
100.0000 ug | ORAL_TABLET | Freq: Every day | ORAL | Status: DC
  Administered 2023-11-02: 100 ug via ORAL
  Filled 2023-11-01: qty 1

## 2023-11-01 MED ORDER — MONTELUKAST SODIUM 10 MG PO TABS
10.0000 mg | ORAL_TABLET | ORAL | Status: DC
  Administered 2023-11-01 – 2023-11-02 (×2): 10 mg via ORAL
  Filled 2023-11-01 (×2): qty 1

## 2023-11-01 MED ORDER — INSULIN ASPART 100 UNIT/ML IJ SOLN
5.0000 [IU] | Freq: Three times a day (TID) | INTRAMUSCULAR | Status: DC
  Administered 2023-11-01 – 2023-11-02 (×4): 5 [IU] via SUBCUTANEOUS

## 2023-11-01 MED ORDER — VITAMIN D (ERGOCALCIFEROL) 1.25 MG (50000 UNIT) PO CAPS
50000.0000 [IU] | ORAL_CAPSULE | ORAL | Status: DC
  Administered 2023-11-01: 50000 [IU] via ORAL
  Filled 2023-11-01: qty 1

## 2023-11-01 MED ORDER — INSULIN GLARGINE 100 UNIT/ML ~~LOC~~ SOLN
25.0000 [IU] | Freq: Every day | SUBCUTANEOUS | Status: DC
  Filled 2023-11-01: qty 0.25

## 2023-11-01 MED ORDER — TAMOXIFEN CITRATE 10 MG PO TABS
20.0000 mg | ORAL_TABLET | Freq: Every day | ORAL | Status: DC
  Administered 2023-11-01 – 2023-11-02 (×2): 20 mg via ORAL
  Filled 2023-11-01 (×2): qty 2

## 2023-11-01 MED ORDER — INSULIN GLARGINE 100 UNIT/ML ~~LOC~~ SOLN
20.0000 [IU] | Freq: Every day | SUBCUTANEOUS | Status: AC
  Administered 2023-11-01: 20 [IU] via SUBCUTANEOUS
  Filled 2023-11-01: qty 0.2

## 2023-11-01 MED ORDER — PREDNISONE 5 MG PO TABS
7.5000 mg | ORAL_TABLET | Freq: Every morning | ORAL | Status: DC
  Administered 2023-11-01 – 2023-11-02 (×2): 7.5 mg via ORAL
  Filled 2023-11-01 (×2): qty 2

## 2023-11-01 NOTE — Progress Notes (Addendum)
 PROGRESS NOTE    [name]  WJX:[id number] DOB: [birth year] DOA: 10/29/2023 PCP: Irena Reichmann, DO   Chief Complaint  Patient presents with   Numbness    Brief Narrative:  The patient is a 73 year old African-American female with past medical history significant for CVA, history of insulin-dependent diabetes mellitus type 2 on insulin pump, hypertension, hyperlipidemia, myasthenia gravis, OSA, GERD, history of thyroid cancer status post radiation and total thyroidectomy, history of breast cancer status postlumpectomy and adjuvant chemotherapy and radiation who presented to the ED with right-sided weakness and numbness. Further workup was done and she is found to have an acute CVA and undergoing a stroke workup. Neurology consulted and PT OT recommending CIR now.   Patient is currently medically stable for discharge whenever CIR bed is available.  Assessment & Plan:   Principal Problem:   Stroke [facility]) Active Problems:   Essential hypertension, benign   OSA on CPAP   Myasthenia gravis in remission (HCC)   Gastroesophageal reflux disease without esophagitis   Hyperlipidemia associated with type 2 diabetes mellitus (HCC)   Insulin dependent type 2 diabetes mellitus (HCC)    Ischemic infarct, right pontine infarct -Secondary to small vessel disease -MRA Suspected flow-limiting stenosis of the left proximal ICA.  -MRI  5 mm acute infarct within the dorsal pons on the right.  -2D Echo EF 60-65%. LV grade I diastolic dysfunction -US carotid right ICA are consistent with a 40-59% stenosis. Left Carotid: Patent ICA stent with some narrowing noted proximal through mid stent  -LDL 41 -HgbA1c 9.2 -VTE prophylaxis - Lovenox -Neurology input greatly appreciated, she is on aspirin 81 mg oral daily at home, recommendation for dual antiplatelet therapy aspirin and Plavix x 3 weeks then Plavix alone    Essential Hypertension: = Home medication include olmesartan 20 g p.o. daily and blood  pressure is now stable and goal blood pressures less than 220/110.   Hyperlipidemia:  LDL is 41, continue with rosuvastatin, 40 mg mg p.o. daily, ezetimibe 10 mg p.o. daily and Repatha   Uncontrolled controlled diabetes mellitus type 2: Insulin pump at home and Jardiance.  Hemoglobin A1c with here was 9.2.  Continuing long-acting with Lantus 20 and moderate NovoLog sounds with insulin before meals and at bedtime. -I have discussed with her to bring her insulin pump, so she can resume using it. -ABG remains significantly elevated, Lantus will be further adjusted, would like to transition to daytime instead of nighttime, so we will give an 18 units for now, and lowered her evening dose to 20 units so she can be resumed on Lantus scheduled daily from tomorrow.   Normocytic Anemia/Thrombocytopenia:  -Stable, at baseline, no indication for transfusions  -Folic acid and B12 within normal level  AKI on CKD stage 2 Metabolic acidosis -Is a slight metabolic acidosis with a CO2 of 19, anion gap of 6, chloride level 110 -Avoid Nephrotoxic Medications, Contrast Dyes, Hypotension and Dehydration to Ensure Adequate Renal Perfusion and will need to Renally Adjust Meds   Hypothyroidism:  -TSH level significantly low 0.012 2, she is status post thyroidectomy due to thyroid cancer, so we would like to target low TSH level, but this is significantly low, I will check free T4 level, but meanwhile I will go increase her Synthroid from 200 mcg to 100 mcg -Follow on Free T4 level -Recheck level in 4 to 6 weeks and adjust further as needed  History of breast cancer -Status post surgery, and radiation last year, -Continue with tamoxifen  Ocular Myasthenia Gravis:  -C/w family at 1000 g p.o. twice daily and Rituxumab and gets IVIG q6weeks and follows [facility] as outpatient  -Resume home dose prednisone   Hypokalemia -Replaced   OSA: C/w CPAP   GERD/GI Prophylaxis: - on PPI   Class II Obesity:  Body mass  index is 35.36 kg/m.    DVT prophylaxis: Lovenox Code Status: Full code Family Communication: None at bedside Disposition:   Status is: Observation    Consultants:  Neurology Rehab   Subjective:  No significant events overnight, CBG remains uncontrolled, she reports no significant change in right sided weakness Objective: Vitals:   10/31/23 1951 11/01/23 0000 11/01/23 0400 11/01/23 0800  BP: (!) 127/59 (!) 156/55 (!) 156/61 (!) 156/63  Pulse: 95 88 81 80  Resp: 19 16 18 13   Temp: 98.6 F (37 C) 98.3 F (36.8 C) 98.2 F (36.8 C) 98.2 F (36.8 C)  TempSrc: Oral Oral Oral Oral  SpO2: 95% 94% 99% 96%  Weight:        Intake/Output Summary (Last 24 hours) at 11/01/2023 1306 Last data filed at 11/01/2023 0900 Gross per 24 hour  Intake 480 ml  Output --  Net 480 ml   Filed Weights   10/29/23 1158  Weight: 90.5 kg    Examination:  Awake Alert, Oriented X 3, she has right upper and lower extremity weakness Symmetrical Chest wall movement, Good air movement bilaterally, CTAB RRR,No Gallops,Rubs or new Murmurs, No Parasternal Heave +ve B.Sounds, Abd Soft, No tenderness, No rebound - guarding or rigidity. No Cyanosis, Clubbing or edema, No new Rash or bruise       Data Reviewed: I have personally reviewed following labs and imaging studies  CBC: Recent Labs  Lab 10/29/23 1155 10/30/23 0248 10/31/23 0622  WBC 6.2 5.0 3.7*  NEUTROABS 4.8  --  2.1  HGB 10.8* 8.6* 9.7*  HCT 34.4* 27.1* 29.9*  MCV 93.5 96.4 93.4  PLT 166 147* 156    Basic Metabolic Panel: Recent Labs  Lab 10/29/23 1155 10/30/23 0248 10/31/23 0622 11/01/23 0943  NA 141 135 137 138  K 3.3* 4.6 4.1 4.0  CL 112* 110 109 106  CO2 21* 19* 22 24  GLUCOSE 196* 390* 263* 296*  BUN 18 20 17 16   CREATININE 0.89 1.15* 1.06* 1.08*  CALCIUM 8.8* 8.2* 8.8* 9.2  MG  --   --  1.7  --   PHOS  --   --  3.4  --     GFR: Estimated Creatinine Clearance: 51.7 mL/min (A) (by C-G formula based on  SCr of 1.08 mg/dL (H)).  Liver Function Tests: Recent Labs  Lab 10/29/23 1155 10/31/23 0622  AST 25 21  ALT 22 17  ALKPHOS 84 85  BILITOT 0.5 0.5  PROT 7.6 6.0*  ALBUMIN 3.2* 2.7*    CBG: Recent Labs  Lab 10/31/23 1150 10/31/23 1536 10/31/23 2101 11/01/23 0802 11/01/23 1216  GLUCAP 248* 341* 337* 271* 269*     No results found for this or any previous visit (from the past 240 hours).       Radiology Studies: No results found.       Scheduled Meds:  aspirin EC  81 mg Oral Daily   clopidogrel  75 mg Oral Daily   cyanocobalamin  1,000 mcg Oral Daily   enoxaparin (LOVENOX) injection  40 mg Subcutaneous Q24H   ezetimibe  10 mg Oral Daily   insulin aspart  0-15 Units Subcutaneous TID WC  insulin aspart  0-5 Units Subcutaneous QHS   insulin aspart  5 Units Subcutaneous TID WC   insulin glargine  18 Units Subcutaneous Once   insulin glargine  20 Units Subcutaneous QHS   [START ON 11/02/2023] insulin glargine  25 Units Subcutaneous Daily   levothyroxine  200 mcg Oral Q0600   montelukast  10 mg Oral BH-q7a   mycophenolate  1,000 mg Oral BID   pantoprazole  40 mg Oral Daily   predniSONE  7.5 mg Oral q morning   pregabalin  25 mg Oral BID   rosuvastatin  40 mg Oral Daily   tamoxifen  20 mg Oral Daily   Vitamin D (Ergocalciferol)  50,000 Units Oral Q Mon   Continuous Infusions:   LOS: 0 days       Huey Bienenstock, MD [facility]   To contact the attending provider between 7A-7P or the covering provider during after hours 7P-7A, please log into the web site [email or web address] and access using universal [place] password for that web site. If you do not have the password, please call the hospital operator.  11/01/2023, 1:06 PM

## 2023-11-01 NOTE — Progress Notes (Signed)
 Inpatient Rehab Admissions Coordinator:  Saw pt at bedside to inform pt that will begin insurance authorization.  Insurance authorization started at [employer].   Wolfgang Phoenix, MS, CCC-SLP Admissions Coordinator [phone]

## 2023-11-01 NOTE — Consult Note (Signed)
 Physical Medicine and Rehabilitation Consult Reason for Consult:CVA Referring Physician: Elgergawy   HPI: [name] is a 73 y.o. 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  Patient has history of prior C3-C5 fusion as well as left ICA stent.  She has a history of ocular myasthenia gravis but has been followed by [facility] neurology and on Rituxan as well as IVIG on a every 6 weeks schedule.  She has an insulin pump for her diabetes however her hemoglobin A1c was elevated at 9.2 on admission. Also prior history of breast carcinoma with paraneoplastic retinopathy She has started working with physical therapy and day after admission was at a min assist level with rolling walker.  At baseline she is modified independent with OT on day after admission she was at a min to mod assist level with ADLs and is normally modified independent  Review of Systems  Constitutional:  Negative for chills and fever.  HENT:  Negative for ear discharge, ear pain and nosebleeds.   Eyes:  Negative for pain, discharge and redness.  Respiratory:  Positive for cough. Negative for hemoptysis, wheezing and stridor.   Cardiovascular:  Negative for chest pain and leg swelling.  Gastrointestinal:  Negative for abdominal pain, nausea and vomiting.  Genitourinary:  Negative for flank pain.  Musculoskeletal:  Negative for falls and joint pain.  Skin:  Negative for itching.  Neurological:  Positive for sensory change and focal weakness.  Endo/Heme/Allergies:  Does not bruise/bleed easily.  Psychiatric/Behavioral:  Negative for depression.    Past Medical History:  Diagnosis Date   Arthritis    "all over my body" (03/13/2013)   Asthma    Asymptomatic carotid artery stenosis, bilateral 10/08/2018   Breast cancer (HCC)    GERD (gastroesophageal reflux disease)    H/O hiatal hernia    Headache    pt states she has had headaches for about 6 months "off and on"   Heart murmur    pt  had echocardiogram on 09/17/21   Hypercholesterolemia 10/08/2018   Hypertension    Hypothyroidism    Myasthenia gravis (HCC)    "in my eyes; diagnsosed > 7 yr ago" (03/13/2013)   Sleep apnea    on CPAP   Stroke (HCC) 2021   Thyroid carcinoma (HCC)    Type II diabetes mellitus (HCC)    Past Surgical History:  Procedure Laterality Date   ABDOMINAL HYSTERECTOMY     ANTERIOR CERVICAL DECOMP/DISCECTOMY FUSION     "I've had severa ORs; always went in from the front" (03/13/2013)   APPENDECTOMY     BREAST LUMPECTOMY WITH RADIOACTIVE SEED AND SENTINEL LYMPH NODE BIOPSY Left 09/02/2022   Procedure: LEFT BREAST LUMPECTOMY WITH RADIOACTIVE SEED AND SENTINEL LYMPH NODE BIOPSY;  Surgeon: Abigail Miyamoto, MD;  Location: [facility] OR;  Service: General;  Laterality: Left;   CARDIAC CATHETERIZATION     "several" (03/13/2013)   CARPAL TUNNEL RELEASE Right    CATARACT EXTRACTION W/ INTRAOCULAR LENS  IMPLANT, BILATERAL Bilateral    CHOLECYSTECTOMY     KNEE ARTHROSCOPY Left    SHOULDER ARTHROSCOPY W/ ROTATOR CUFF REPAIR Left    TONSILLECTOMY     TOTAL THYROIDECTOMY     TRANSCAROTID ARTERY REVASCULARIZATION  Left 09/24/2021   Procedure: LEFT TRANSCAROTID ARTERY REVASCULARIZATION;  Surgeon: Leonie Douglas, MD;  Location: [facility] OR;  Service: Vascular;  Laterality: Left;   TRANSCAROTID ARTERY REVASCULARIZATION  Right 10/23/2021   Procedure: Right Transcarotid Artery Revascularization;  Surgeon: Leonie Douglas, MD;  Location: [facility] OR;  Service: Vascular;  Laterality: Right;   ULTRASOUND GUIDANCE FOR VASCULAR ACCESS Right 09/24/2021   Procedure: ULTRASOUND GUIDANCE FOR VASCULAR ACCESS;  Surgeon: Leonie Douglas, MD;  Location: [facility] OR;  Service: Vascular;  Laterality: Right;   ULTRASOUND GUIDANCE FOR VASCULAR ACCESS Right 10/23/2021   Procedure: ULTRASOUND GUIDANCE FOR VASCULAR ACCESS, LEFT FEMORAL VEIN;  Surgeon: Leonie Douglas, MD;  Location: [facility] OR;  Service: Vascular;  Laterality: Right;   Family History   Problem Relation Age of Onset   Coronary artery disease Sister        s/p coronary stenting   Stroke Sister 73   Diabetes Sister    Congestive Heart Failure Mother    Asthma Mother    Diabetes Mother    Congestive Heart Failure Father    Lung cancer Father    Asthma Father    Diabetes Father    Diabetes Brother    Social History:  reports that she has never smoked. She has never used smokeless tobacco. She reports that she does not drink alcohol and does not use drugs. Allergies:  Allergies  Allergen Reactions   Contrast Media [Iodinated Contrast Media] Anaphylaxis    01/10/15 and 09/18/2021 --PT GIVEN 13 HR PRE MEDS FOR CT with contrast TOLERATED IV CONTRAST W/O ANY REACTION   Fluorescein Shortness Of Breath and Other (See Comments)    (Dye)   Iodine Anaphylaxis    Contrast dye - iodine   Magnesium-Containing Compounds Other (See Comments)    H/O myasthenia gravis- caution replacing IV    Molds & Smuts Shortness Of Breath and Other (See Comments)    Congestion and wheezing, also   Shellfish-Derived Products Anaphylaxis, Shortness Of Breath, Swelling and Other (See Comments)    Welts, also   Pholcodine Hives   Azithromycin Nausea Only   Codeine Hives and Nausea Only   Metformin Hcl Er Nausea Only   Oxycodone Nausea Only    Nausea 08/2022 admission.   Tramadol Hcl Nausea Only   Other Rash and Other (See Comments)    Coban causes welts, also   Medications Prior to Admission  Medication Sig Dispense Refill   acetaminophen (TYLENOL) 500 MG tablet Take 500 mg by mouth every 6 (six) hours as needed for moderate pain.     aspirin EC 81 MG EC tablet Take 1 tablet (81 mg total) by mouth daily.     Biotin 5000 MCG CAPS Take 5,000 mcg by mouth daily with breakfast.      cycloSPORINE (RESTASIS) 0.05 % ophthalmic emulsion Place 1 drop into both eyes 2 (two) times daily. 0.4 mL 0   diclofenac Sodium (VOLTAREN) 1 % GEL Apply 2 g topically 4 (four) times daily. (Patient taking  differently: Apply 1 application  topically daily as needed (pain).) 2 g 0   diphenhydrAMINE 25 mg in sodium chloride 0.9 % 50 mL Inject 25 mg into the vein See admin instructions. Administer 25 mg intravenously at start of Gamunex infusion, then administer 25 mg during infusion     diphenoxylate-atropine (LOMOTIL) 2.5-0.025 MG tablet Take 1-2 tablets by mouth 4 (four) times daily as needed for diarrhea or loose stools. 30 tablet 2   ELDERBERRY PO Take 1 tablet by mouth daily.     empagliflozin (JARDIANCE) 25 MG TABS tablet Take 25 mg by mouth daily.     esomeprazole (NEXIUM) 40 MG capsule Take 1 capsule (40 mg total) by mouth 2 (two) times daily.  60 capsule 0   Evolocumab with Infusor (REPATHA PUSHTRONEX SYSTEM) 420 MG/3.5ML SOCT Inject 420 mg into the skin every 30 (thirty) days.     Exenatide ER (BYDUREON BCISE) 2 MG/0.85ML AUIJ Inject 2 mg into the skin every Monday.     ezetimibe (ZETIA) 10 MG tablet TAKE 1 TABLET BY MOUTH EVERY DAY 90 tablet 1   gabapentin (NEURONTIN) 100 MG capsule Take 100-200 mg by mouth at bedtime.     Immune Globulin, Human, 40 GM/400ML SOLN Inject into the vein every 6 (six) weeks. Infusions are administered at home by home health nurse - last infusion 09/08/21 and 09/09/21; next infusion due 10/19/21 and 10/20/21     insulin degludec (TRESIBA FLEXTOUCH) 100 UNIT/ML FlexTouch Pen Inject 15 Units into the skin in the morning.     insulin lispro (HUMALOG) 100 UNIT/ML injection Inject into the skin See admin instructions. Inject subcutaneously 3 (three) times daily with meals With meals through the Omnipod Dash Pump every 72 hours as directed     Levothyroxine Sodium 200 MCG CAPS Take 200 mcg by mouth daily before breakfast.     lidocaine-prilocaine (EMLA) cream Apply to affected area once 30 g 3   loperamide (IMODIUM A-D) 2 MG tablet Take 2 mg by mouth 4 (four) times daily as needed for diarrhea or loose stools.     loratadine (CLARITIN) 10 MG tablet Take 1 tablet (10 mg total)  by mouth at bedtime. 30 tablet 0   meclizine (ANTIVERT) 25 MG tablet Take 25 mg by mouth 3 (three) times daily as needed for dizziness or nausea (or migraine-related nausea).     montelukast (SINGULAIR) 10 MG tablet Take 1 tablet (10 mg total) by mouth every morning. 30 tablet 0   mycophenolate (CELLCEPT) 500 MG tablet Take 1,000 mg by mouth 2 (two) times daily. For myasthenia gravis     olmesartan (BENICAR) 20 MG tablet Take 1 tablet (20 mg total) by mouth daily. 30 tablet 0   ondansetron (ZOFRAN) 8 MG tablet TAKE 1 TABLET BY MOUTH EVERY 8  HOURS AS NEEDED FOR NAUSEA AND  VOMITING 60 tablet 1   predniSONE (DELTASONE) 2.5 MG tablet Take 7.5 mg by mouth every morning. For myasthenia gravis     pregabalin (LYRICA) 25 MG capsule Take 1 capsule (25 mg total) by mouth 2 (two) times daily. 180 capsule 0   PRESCRIPTION MEDICATION Pt uses a cpap nightly     PROAIR HFA 108 (90 BASE) MCG/ACT inhaler Inhale 2 puffs into the lungs every 6 (six) hours as needed for wheezing or shortness of breath.      prochlorperazine (COMPAZINE) 10 MG tablet Take 1 tablet (10 mg total) by mouth every 6 (six) hours as needed for nausea or vomiting. 30 tablet 1   riTUXimab (RITUXAN) 500 MG/50ML injection Inject into the vein every 6 (six) months.     rosuvastatin (CRESTOR) 40 MG tablet Take 1 tablet (40 mg total) by mouth daily. 90 tablet 3   tamoxifen (NOLVADEX) 20 MG tablet Take 1 tablet (20 mg total) by mouth daily. 90 tablet 1   topiramate (TOPAMAX) 50 MG tablet TAKE 1 TABLET BY MOUTH AT  BEDTIME 100 tablet 0   traMADol (ULTRAM) 50 MG tablet Take 1 tablet (50 mg total) by mouth every 6 (six) hours as needed for moderate pain. 25 tablet 0   vitamin B-12 (CYANOCOBALAMIN) 1000 MCG tablet Take 1,000 mcg by mouth once a week.     Vitamin D, Ergocalciferol, (DRISDOL) 50000  UNITS CAPS capsule Take 50,000 Units by mouth every Monday.       Home: Home Living Family/patient expects to be discharged to:: Private residence Living  Arrangements: Alone Available Help at Discharge: Family, Available 24 hours/day Type of Home: House Home Access: Stairs to enter [employer] of Steps: 2 Entrance Stairs-Rails: None Home Layout: One level Bathroom Shower/Tub: Engineer, manufacturing systems: Standard Bathroom Accessibility: Yes Home Equipment: Agricultural consultant (2 wheels), Tub bench, Grab bars - tub/shower  Functional History: Prior Function Prior Level of Function : Independent/Modified Independent Mobility Comments: walks with RW ADLs Comments: ind, cooks/cleans, manages meds Functional Status:  Mobility: Bed Mobility Overal bed mobility: Needs Assistance Bed Mobility: Supine to Sit, Sit to Supine Supine to sit: Min assist Sit to supine: Min assist General bed mobility comments: Min A to assist BLEs to EOB and return to bed Transfers Overall transfer level: Needs assistance Equipment used: Rolling walker (2 wheels) Transfers: Sit to/from Stand Sit to Stand: Min assist General transfer comment: up from tall stretcher in ED Ambulation/Gait Ambulation/Gait assistance: Min assist, Mod assist Gait Distance (Feet): 20 Feet Assistive device: Rolling walker (2 wheels) Gait Pattern/deviations: Step-to pattern, Decreased step length - right, Decreased stride length, Drifts right/left, Decreased dorsiflexion - right General Gait Details: assist to progress the R LE, assist for balance and walker management on turns    ADL: ADL Overall ADL's : Needs assistance/impaired Eating/Feeding: Sitting, Minimal assistance Eating/Feeding Details (indicate cue type and reason): able to use LUE to feed, gave pt red tubing for built up handles when needed for RUE Grooming: Wash/dry face, Standing, Contact guard assist Grooming Details (indicate cue type and reason): incorporating bilat intergation Upper Body Bathing: Sitting, Set up Lower Body Bathing: Sitting/lateral leans, Minimal assistance Upper Body Dressing :  Sitting, Moderate assistance Upper Body Dressing Details (indicate cue type and reason): assists with RUE Lower Body Dressing: Moderate assistance, Sitting/lateral leans Toilet Transfer: Minimal assistance, Rolling walker (2 wheels), Ambulation Toileting- Clothing Manipulation and Hygiene: Sit to/from stand, Minimal assistance Functional mobility during ADLs: Minimal assistance, Rolling walker (2 wheels)  Cognition: Cognition Orientation Level: Oriented X4 Cognition Arousal: Alert Behavior During Therapy: WFL for tasks assessed/performed  Blood pressure (!) 156/63, pulse 80, temperature 98.2 F (36.8 C), temperature source Oral, resp. rate 13, weight 90.5 kg, SpO2 96%. Physical Exam  General: No acute distress Mood and affect are appropriate Heart: Regular rate and rhythm no rubs murmurs or extra sounds Lungs: Clear to auscultation, breathing unlabored, no rales or wheezes Abdomen: Positive bowel sounds, soft nontender to palpation, nondistended Extremities: No clubbing, cyanosis, or edema Skin: No evidence of breakdown, no evidence of rash Neurologic: No evidence of ptosis, no evidence of facial droop there is reduced sensation to light touch in the right V1 V2 V3 region motor strength is 5/5 in left and 3 - right deltoid, bicep, tricep, grip, hip flexor, knee extensors, ankle dorsiflexor and plantar flexor Sensory exam normal sensation to light touch left side reduced to light touch right side  cerebellar exam normal finger to nose to finger on the left side but reduced on the right side due to weakness  musculoskeletal: Full range of motion in all 4 extremities. No joint swelling  Results for orders placed or performed during the hospital encounter of 10/29/23 (from the past 24 hours)  Glucose, capillary     Status: Abnormal   Collection Time: 10/31/23 11:50 AM  Result Value Ref Range   Glucose-Capillary 248 (H) 70 - 99 mg/dL  Glucose, capillary     Status: Abnormal   Collection  Time: 10/31/23  3:36 PM  Result Value Ref Range   Glucose-Capillary 341 (H) 70 - 99 mg/dL  Glucose, capillary     Status: Abnormal   Collection Time: 10/31/23  9:01 PM  Result Value Ref Range   Glucose-Capillary 337 (H) 70 - 99 mg/dL  Glucose, capillary     Status: Abnormal   Collection Time: 11/01/23  8:02 AM  Result Value Ref Range   Glucose-Capillary 271 (H) 70 - 99 mg/dL  TSH     Status: Abnormal   Collection Time: 11/01/23  9:43 AM  Result Value Ref Range   TSH 0.012 (L) 0.350 - 4.500 uIU/mL  Basic metabolic panel     Status: Abnormal   Collection Time: 11/01/23  9:43 AM  Result Value Ref Range   Sodium 138 135 - 145 mmol/L   Potassium 4.0 3.5 - 5.1 mmol/L   Chloride 106 98 - 111 mmol/L   CO2 24 22 - 32 mmol/L   Glucose, Bld 296 (H) 70 - 99 mg/dL   BUN 16 8 - 23 mg/dL   Creatinine, Ser 0.98 (H) 0.44 - 1.00 mg/dL   Calcium 9.2 8.9 - 11.9 mg/dL   GFR, Estimated 55 (L) >60 mL/min   Anion gap 8 5 - 15   No results found.  Assessment/Plan: Diagnosis: Right dorsal pontine infarct as well as prior left thalamic infarct Does the need for close, 24 hr/day medical supervision in concert with the patient's rehab needs make it unreasonable for this patient to be served in a less intensive setting? Yes Co-Morbidities requiring supervision/potential complications:  -Uncontrolled diabetes, history of ocular myasthenia gravis requiring immune modulation Due to bladder management, bowel management, safety, skin/wound care, disease management, medication administration, pain management, and patient education, does the patient require 24 hr/day rehab nursing? Yes Does the patient require coordinated care of a physician, rehab nurse, therapy disciplines of PT, OT to address physical and functional deficits in the context of the above medical diagnosis(es)? Yes Addressing deficits in the following areas: balance, endurance, locomotion, strength, transferring, bowel/bladder control, bathing,  dressing, feeding, grooming, and toileting Can the patient actively participate in an intensive therapy program of at least 3 hrs of therapy per day at least 5 days per week? Yes The potential for patient to make measurable gains while on inpatient rehab is good Anticipated functional outcomes upon discharge from inpatient rehab are supervision  with PT, supervision with OT, n/a with SLP. Estimated rehab length of stay to reach the above functional goals is: 7 to 10 days Anticipated discharge destination:  Home with family assistance Overall Rehab/Functional Prognosis: good  POST ACUTE RECOMMENDATIONS: This patient's condition is appropriate for continued rehabilitative care in the following setting: CIR Patient has agreed to participate in recommended program. Yes Note that insurance prior authorization may be required for reimbursement for recommended care.  Comment: Need to confirm discharge plan with family the patient states that she has multiple siblings in the area Neuro exam most consistent with prior CVA in the left thalamus area but patient has declined in her level of functioning since the right pontine stroke   I have personally performed a face to face diagnostic evaluation of this patient. Additionally, I have examined the patient's medical record including any pertinent labs and radiographic images.    Thanks,  Erick Colace, MD 11/01/2023

## 2023-11-01 NOTE — Plan of Care (Signed)

## 2023-11-02 ENCOUNTER — Encounter (HOSPITAL_COMMUNITY): Payer: Self-pay | Admitting: Physical Medicine and Rehabilitation

## 2023-11-02 ENCOUNTER — Inpatient Hospital Stay (HOSPITAL_COMMUNITY)
Admission: AD | Admit: 2023-11-02 | Discharge: 2023-11-18 | DRG: 057 | Disposition: A | Discharge status: Home / Self Care | Source: Intra-hospital | Attending: Physical Medicine and Rehabilitation | Admitting: Physical Medicine and Rehabilitation

## 2023-11-02 ENCOUNTER — Ambulatory Visit: Payer: Medicare Other | Admitting: Hematology

## 2023-11-02 ENCOUNTER — Other Ambulatory Visit: Payer: Medicare Other

## 2023-11-02 ENCOUNTER — Other Ambulatory Visit: Payer: Self-pay

## 2023-11-02 DIAGNOSIS — Z79624 Long term (current) use of inhibitors of nucleotide synthesis: Secondary | ICD-10-CM

## 2023-11-02 DIAGNOSIS — Z961 Presence of intraocular lens: Secondary | ICD-10-CM | POA: Diagnosis present

## 2023-11-02 DIAGNOSIS — E1122 Type 2 diabetes mellitus with diabetic chronic kidney disease: Secondary | ICD-10-CM | POA: Diagnosis present

## 2023-11-02 DIAGNOSIS — H469 Unspecified optic neuritis: Secondary | ICD-10-CM | POA: Diagnosis present

## 2023-11-02 DIAGNOSIS — D72819 Decreased white blood cell count, unspecified: Secondary | ICD-10-CM | POA: Diagnosis present

## 2023-11-02 DIAGNOSIS — Z833 Family history of diabetes mellitus: Secondary | ICD-10-CM | POA: Diagnosis not present

## 2023-11-02 DIAGNOSIS — Z9109 Other allergy status, other than to drugs and biological substances: Secondary | ICD-10-CM

## 2023-11-02 DIAGNOSIS — K219 Gastro-esophageal reflux disease without esophagitis: Secondary | ICD-10-CM | POA: Diagnosis present

## 2023-11-02 DIAGNOSIS — I959 Hypotension, unspecified: Secondary | ICD-10-CM | POA: Diagnosis not present

## 2023-11-02 DIAGNOSIS — E890 Postprocedural hypothyroidism: Secondary | ICD-10-CM | POA: Diagnosis not present

## 2023-11-02 DIAGNOSIS — E1165 Type 2 diabetes mellitus with hyperglycemia: Secondary | ICD-10-CM | POA: Diagnosis not present

## 2023-11-02 DIAGNOSIS — D649 Anemia, unspecified: Secondary | ICD-10-CM | POA: Diagnosis present

## 2023-11-02 DIAGNOSIS — Z79890 Hormone replacement therapy (postmenopausal): Secondary | ICD-10-CM

## 2023-11-02 DIAGNOSIS — I639 Cerebral infarction, unspecified: Secondary | ICD-10-CM | POA: Diagnosis not present

## 2023-11-02 DIAGNOSIS — E1169 Type 2 diabetes mellitus with other specified complication: Secondary | ICD-10-CM | POA: Diagnosis present

## 2023-11-02 DIAGNOSIS — Z90710 Acquired absence of both cervix and uterus: Secondary | ICD-10-CM

## 2023-11-02 DIAGNOSIS — I129 Hypertensive chronic kidney disease with stage 1 through stage 4 chronic kidney disease, or unspecified chronic kidney disease: Secondary | ICD-10-CM | POA: Diagnosis present

## 2023-11-02 DIAGNOSIS — G7000 Myasthenia gravis without (acute) exacerbation: Secondary | ICD-10-CM | POA: Diagnosis not present

## 2023-11-02 DIAGNOSIS — Z79620 Long term (current) use of immunosuppressive biologic: Secondary | ICD-10-CM

## 2023-11-02 DIAGNOSIS — I69351 Hemiplegia and hemiparesis following cerebral infarction affecting right dominant side: Secondary | ICD-10-CM | POA: Diagnosis not present

## 2023-11-02 DIAGNOSIS — Z923 Personal history of irradiation: Secondary | ICD-10-CM | POA: Diagnosis not present

## 2023-11-02 DIAGNOSIS — R7989 Other specified abnormal findings of blood chemistry: Secondary | ICD-10-CM | POA: Diagnosis not present

## 2023-11-02 DIAGNOSIS — R112 Nausea with vomiting, unspecified: Secondary | ICD-10-CM | POA: Diagnosis present

## 2023-11-02 DIAGNOSIS — N179 Acute kidney failure, unspecified: Secondary | ICD-10-CM | POA: Diagnosis present

## 2023-11-02 DIAGNOSIS — Z6835 Body mass index (BMI) 35.0-35.9, adult: Secondary | ICD-10-CM | POA: Diagnosis not present

## 2023-11-02 DIAGNOSIS — X58XXXA Exposure to other specified factors, initial encounter: Secondary | ICD-10-CM | POA: Diagnosis present

## 2023-11-02 DIAGNOSIS — Z7984 Long term (current) use of oral hypoglycemic drugs: Secondary | ICD-10-CM

## 2023-11-02 DIAGNOSIS — Z825 Family history of asthma and other chronic lower respiratory diseases: Secondary | ICD-10-CM

## 2023-11-02 DIAGNOSIS — E11649 Type 2 diabetes mellitus with hypoglycemia without coma: Secondary | ICD-10-CM | POA: Diagnosis not present

## 2023-11-02 DIAGNOSIS — Z4682 Encounter for fitting and adjustment of non-vascular catheter: Secondary | ICD-10-CM | POA: Diagnosis not present

## 2023-11-02 DIAGNOSIS — I6350 Cerebral infarction due to unspecified occlusion or stenosis of unspecified cerebral artery: Principal | ICD-10-CM | POA: Diagnosis present

## 2023-11-02 DIAGNOSIS — Z823 Family history of stroke: Secondary | ICD-10-CM

## 2023-11-02 DIAGNOSIS — R42 Dizziness and giddiness: Secondary | ICD-10-CM | POA: Diagnosis present

## 2023-11-02 DIAGNOSIS — I69392 Facial weakness following cerebral infarction: Secondary | ICD-10-CM | POA: Diagnosis not present

## 2023-11-02 DIAGNOSIS — I6522 Occlusion and stenosis of left carotid artery: Secondary | ICD-10-CM | POA: Diagnosis present

## 2023-11-02 DIAGNOSIS — G43909 Migraine, unspecified, not intractable, without status migrainosus: Secondary | ICD-10-CM | POA: Diagnosis present

## 2023-11-02 DIAGNOSIS — R29818 Other symptoms and signs involving the nervous system: Secondary | ICD-10-CM | POA: Diagnosis not present

## 2023-11-02 DIAGNOSIS — D696 Thrombocytopenia, unspecified: Secondary | ICD-10-CM | POA: Diagnosis present

## 2023-11-02 DIAGNOSIS — R6510 Systemic inflammatory response syndrome (SIRS) of non-infectious origin without acute organ dysfunction: Secondary | ICD-10-CM | POA: Diagnosis not present

## 2023-11-02 DIAGNOSIS — M79601 Pain in right arm: Secondary | ICD-10-CM | POA: Diagnosis present

## 2023-11-02 DIAGNOSIS — Z85850 Personal history of malignant neoplasm of thyroid: Secondary | ICD-10-CM

## 2023-11-02 DIAGNOSIS — E872 Acidosis, unspecified: Secondary | ICD-10-CM | POA: Diagnosis present

## 2023-11-02 DIAGNOSIS — Z7902 Long term (current) use of antithrombotics/antiplatelets: Secondary | ICD-10-CM

## 2023-11-02 DIAGNOSIS — G4733 Obstructive sleep apnea (adult) (pediatric): Secondary | ICD-10-CM | POA: Diagnosis present

## 2023-11-02 DIAGNOSIS — Z7982 Long term (current) use of aspirin: Secondary | ICD-10-CM

## 2023-11-02 DIAGNOSIS — Z9221 Personal history of antineoplastic chemotherapy: Secondary | ICD-10-CM

## 2023-11-02 DIAGNOSIS — K5901 Slow transit constipation: Secondary | ICD-10-CM | POA: Diagnosis not present

## 2023-11-02 DIAGNOSIS — Z888 Allergy status to other drugs, medicaments and biological substances status: Secondary | ICD-10-CM

## 2023-11-02 DIAGNOSIS — R29810 Facial weakness: Secondary | ICD-10-CM | POA: Diagnosis not present

## 2023-11-02 DIAGNOSIS — E1142 Type 2 diabetes mellitus with diabetic polyneuropathy: Secondary | ICD-10-CM | POA: Diagnosis present

## 2023-11-02 DIAGNOSIS — Z79899 Other long term (current) drug therapy: Secondary | ICD-10-CM | POA: Diagnosis not present

## 2023-11-02 DIAGNOSIS — Z91041 Radiographic dye allergy status: Secondary | ICD-10-CM

## 2023-11-02 DIAGNOSIS — I6381 Other cerebral infarction due to occlusion or stenosis of small artery: Secondary | ICD-10-CM | POA: Diagnosis present

## 2023-11-02 DIAGNOSIS — G8191 Hemiplegia, unspecified affecting right dominant side: Secondary | ICD-10-CM | POA: Diagnosis present

## 2023-11-02 DIAGNOSIS — D72829 Elevated white blood cell count, unspecified: Secondary | ICD-10-CM | POA: Diagnosis present

## 2023-11-02 DIAGNOSIS — S301XXA Contusion of abdominal wall, initial encounter: Secondary | ICD-10-CM | POA: Diagnosis not present

## 2023-11-02 DIAGNOSIS — M21379 Foot drop, unspecified foot: Secondary | ICD-10-CM | POA: Diagnosis present

## 2023-11-02 DIAGNOSIS — R1030 Lower abdominal pain, unspecified: Secondary | ICD-10-CM | POA: Diagnosis present

## 2023-11-02 DIAGNOSIS — Z91013 Allergy to seafood: Secondary | ICD-10-CM

## 2023-11-02 DIAGNOSIS — Z7952 Long term (current) use of systemic steroids: Secondary | ICD-10-CM

## 2023-11-02 DIAGNOSIS — E876 Hypokalemia: Secondary | ICD-10-CM | POA: Diagnosis not present

## 2023-11-02 DIAGNOSIS — Z853 Personal history of malignant neoplasm of breast: Secondary | ICD-10-CM

## 2023-11-02 DIAGNOSIS — Z9641 Presence of insulin pump (external) (internal): Secondary | ICD-10-CM | POA: Diagnosis not present

## 2023-11-02 DIAGNOSIS — K5900 Constipation, unspecified: Secondary | ICD-10-CM | POA: Diagnosis present

## 2023-11-02 DIAGNOSIS — G8111 Spastic hemiplegia affecting right dominant side: Secondary | ICD-10-CM | POA: Diagnosis not present

## 2023-11-02 DIAGNOSIS — Z7409 Other reduced mobility: Secondary | ICD-10-CM | POA: Diagnosis present

## 2023-11-02 DIAGNOSIS — Z9841 Cataract extraction status, right eye: Secondary | ICD-10-CM

## 2023-11-02 DIAGNOSIS — M159 Polyosteoarthritis, unspecified: Secondary | ICD-10-CM | POA: Diagnosis present

## 2023-11-02 DIAGNOSIS — R000 Tachycardia, unspecified: Secondary | ICD-10-CM | POA: Diagnosis present

## 2023-11-02 DIAGNOSIS — I4510 Unspecified right bundle-branch block: Secondary | ICD-10-CM | POA: Diagnosis present

## 2023-11-02 DIAGNOSIS — R079 Chest pain, unspecified: Secondary | ICD-10-CM | POA: Diagnosis not present

## 2023-11-02 DIAGNOSIS — H53452 Other localized visual field defect, left eye: Secondary | ICD-10-CM | POA: Diagnosis present

## 2023-11-02 DIAGNOSIS — Z794 Long term (current) use of insulin: Secondary | ICD-10-CM | POA: Diagnosis not present

## 2023-11-02 DIAGNOSIS — R011 Cardiac murmur, unspecified: Secondary | ICD-10-CM | POA: Diagnosis present

## 2023-11-02 DIAGNOSIS — J45909 Unspecified asthma, uncomplicated: Secondary | ICD-10-CM | POA: Diagnosis not present

## 2023-11-02 DIAGNOSIS — Z79810 Long term (current) use of selective estrogen receptor modulators (SERMs): Secondary | ICD-10-CM

## 2023-11-02 DIAGNOSIS — Z8249 Family history of ischemic heart disease and other diseases of the circulatory system: Secondary | ICD-10-CM

## 2023-11-02 DIAGNOSIS — E7800 Pure hypercholesterolemia, unspecified: Secondary | ICD-10-CM | POA: Diagnosis not present

## 2023-11-02 DIAGNOSIS — Z9842 Cataract extraction status, left eye: Secondary | ICD-10-CM

## 2023-11-02 DIAGNOSIS — M7989 Other specified soft tissue disorders: Secondary | ICD-10-CM | POA: Diagnosis not present

## 2023-11-02 DIAGNOSIS — N182 Chronic kidney disease, stage 2 (mild): Secondary | ICD-10-CM | POA: Diagnosis present

## 2023-11-02 DIAGNOSIS — E8809 Other disorders of plasma-protein metabolism, not elsewhere classified: Secondary | ICD-10-CM | POA: Diagnosis present

## 2023-11-02 DIAGNOSIS — Z885 Allergy status to narcotic agent status: Secondary | ICD-10-CM

## 2023-11-02 DIAGNOSIS — I10 Essential (primary) hypertension: Secondary | ICD-10-CM | POA: Diagnosis not present

## 2023-11-02 DIAGNOSIS — Z881 Allergy status to other antibiotic agents status: Secondary | ICD-10-CM

## 2023-11-02 DIAGNOSIS — Z9049 Acquired absence of other specified parts of digestive tract: Secondary | ICD-10-CM

## 2023-11-02 DIAGNOSIS — R059 Cough, unspecified: Secondary | ICD-10-CM | POA: Diagnosis not present

## 2023-11-02 LAB — GLUCOSE, CAPILLARY
Glucose-Capillary: 298 mg/dL — ABNORMAL HIGH (ref 70–99)
Glucose-Capillary: 332 mg/dL — ABNORMAL HIGH (ref 70–99)

## 2023-11-02 MED ORDER — TAMOXIFEN CITRATE 10 MG PO TABS
20.0000 mg | ORAL_TABLET | Freq: Every day | ORAL | Status: DC
Start: 2023-11-03 — End: 2023-11-18
  Administered 2023-11-03 – 2023-11-18 (×16): 20 mg via ORAL
  Filled 2023-11-02 (×16): qty 2

## 2023-11-02 MED ORDER — ALBUTEROL SULFATE (2.5 MG/3ML) 0.083% IN NEBU: 3.0000 mL | INHALATION_SOLUTION | Freq: Four times a day (QID) | RESPIRATORY_TRACT | Status: DC | PRN

## 2023-11-02 MED ORDER — ENOXAPARIN SODIUM 40 MG/0.4ML IJ SOSY
40.0000 mg | PREFILLED_SYRINGE | INTRAMUSCULAR | Status: DC
Start: 2023-11-02 — End: 2023-11-02

## 2023-11-02 MED ORDER — METHOCARBAMOL 500 MG PO TABS
500.0000 mg | ORAL_TABLET | Freq: Four times a day (QID) | ORAL | Status: DC | PRN
  Filled 2023-11-02 (×2): qty 1

## 2023-11-02 MED ORDER — ENOXAPARIN SODIUM 40 MG/0.4ML IJ SOSY
40.0000 mg | PREFILLED_SYRINGE | INTRAMUSCULAR | Status: DC
  Administered 2023-11-02 – 2023-11-10 (×9): 40 mg via SUBCUTANEOUS
  Filled 2023-11-02 (×9): qty 0.4

## 2023-11-02 MED ORDER — MYCOPHENOLATE MOFETIL 250 MG PO CAPS
1000.0000 mg | ORAL_CAPSULE | Freq: Two times a day (BID) | ORAL | Status: DC
  Administered 2023-11-02 – 2023-11-18 (×32): 1000 mg via ORAL
  Filled 2023-11-02 (×25): qty 4
  Filled 2023-11-02: qty 2
  Filled 2023-11-02 (×10): qty 4
  Filled 2023-11-02: qty 2

## 2023-11-02 MED ORDER — EZETIMIBE 10 MG PO TABS
10.0000 mg | ORAL_TABLET | Freq: Every day | ORAL | Status: DC
  Administered 2023-11-03 – 2023-11-18 (×16): 10 mg via ORAL
  Filled 2023-11-02 (×16): qty 1

## 2023-11-02 MED ORDER — PREDNISONE 5 MG PO TABS
7.5000 mg | ORAL_TABLET | Freq: Every morning | ORAL | Status: DC
  Administered 2023-11-03 – 2023-11-18 (×16): 7.5 mg via ORAL
  Filled 2023-11-02 (×16): qty 1

## 2023-11-02 MED ORDER — ROSUVASTATIN CALCIUM 20 MG PO TABS
40.0000 mg | ORAL_TABLET | Freq: Every day | ORAL | Status: DC
  Administered 2023-11-03 – 2023-11-18 (×16): 40 mg via ORAL
  Filled 2023-11-02 (×16): qty 2

## 2023-11-02 MED ORDER — VITAMIN D (ERGOCALCIFEROL) 1.25 MG (50000 UNIT) PO CAPS
50000.0000 [IU] | ORAL_CAPSULE | ORAL | Status: DC
  Administered 2023-11-08: 50000 [IU] via ORAL
  Filled 2023-11-02: qty 1

## 2023-11-02 MED ORDER — MELATONIN 3 MG PO TABS
3.0000 mg | ORAL_TABLET | Freq: Every evening | ORAL | Status: DC | PRN
  Filled 2023-11-02 (×2): qty 1

## 2023-11-02 MED ORDER — INSULIN PUMP
Freq: Three times a day (TID) | SUBCUTANEOUS | Status: DC
  Administered 2023-11-11: 4 via SUBCUTANEOUS
  Administered 2023-11-15: 8.15 via SUBCUTANEOUS
  Filled 2023-11-02: qty 1

## 2023-11-02 MED ORDER — INSULIN GLARGINE 100 UNIT/ML ~~LOC~~ SOLN: 35.0000 [IU] | Freq: Every day | SUBCUTANEOUS | Status: DC

## 2023-11-02 MED ORDER — EMPAGLIFLOZIN 25 MG PO TABS
25.0000 mg | ORAL_TABLET | Freq: Every day | ORAL | Status: DC
  Administered 2023-11-02: 25 mg via ORAL
  Filled 2023-11-02: qty 1

## 2023-11-02 MED ORDER — PREGABALIN 25 MG PO CAPS
25.0000 mg | ORAL_CAPSULE | Freq: Two times a day (BID) | ORAL | Status: DC
  Administered 2023-11-02 – 2023-11-18 (×32): 25 mg via ORAL
  Filled 2023-11-02 (×32): qty 1

## 2023-11-02 MED ORDER — CLOPIDOGREL BISULFATE 75 MG PO TABS
75.0000 mg | ORAL_TABLET | Freq: Every day | ORAL | Status: DC
  Administered 2023-11-03 – 2023-11-18 (×16): 75 mg via ORAL
  Filled 2023-11-02 (×16): qty 1

## 2023-11-02 MED ORDER — EMPAGLIFLOZIN 25 MG PO TABS
25.0000 mg | ORAL_TABLET | Freq: Every day | ORAL | Status: DC
  Administered 2023-11-03 – 2023-11-18 (×16): 25 mg via ORAL
  Filled 2023-11-02 (×16): qty 1

## 2023-11-02 MED ORDER — LEVOTHYROXINE SODIUM 100 MCG PO TABS
100.0000 ug | ORAL_TABLET | Freq: Every day | ORAL | Status: DC
  Administered 2023-11-03 – 2023-11-18 (×16): 100 ug via ORAL
  Filled 2023-11-02 (×16): qty 1

## 2023-11-02 MED ORDER — PANTOPRAZOLE SODIUM 40 MG PO TBEC
40.0000 mg | DELAYED_RELEASE_TABLET | Freq: Every day | ORAL | Status: DC
  Administered 2023-11-03 – 2023-11-12 (×10): 40 mg via ORAL
  Filled 2023-11-02 (×9): qty 1

## 2023-11-02 MED ORDER — INSULIN PUMP
Freq: Three times a day (TID) | SUBCUTANEOUS | Status: DC
  Filled 2023-11-02: qty 1

## 2023-11-02 MED ORDER — ASPIRIN 81 MG PO TBEC: 81.0000 mg | DELAYED_RELEASE_TABLET | Freq: Every day | ORAL | Status: DC

## 2023-11-02 MED ORDER — LEVOTHYROXINE SODIUM 100 MCG PO TABS
100.0000 ug | ORAL_TABLET | Freq: Every day | ORAL | Status: AC
Start: 2023-11-03 — End: ?

## 2023-11-02 MED ORDER — MONTELUKAST SODIUM 10 MG PO TABS
10.0000 mg | ORAL_TABLET | ORAL | Status: DC
  Administered 2023-11-03 – 2023-11-18 (×16): 10 mg via ORAL
  Filled 2023-11-02 (×18): qty 1

## 2023-11-02 MED ORDER — ONDANSETRON HCL 4 MG/2ML IJ SOLN
4.0000 mg | Freq: Four times a day (QID) | INTRAMUSCULAR | Status: DC | PRN
  Administered 2023-11-05: 4 mg via INTRAVENOUS
  Filled 2023-11-02 (×3): qty 2

## 2023-11-02 MED ORDER — ACETAMINOPHEN 325 MG PO TABS
325.0000 mg | ORAL_TABLET | ORAL | Status: DC | PRN
  Administered 2023-11-02 – 2023-11-17 (×22): 650 mg via ORAL
  Filled 2023-11-02 (×22): qty 2

## 2023-11-02 MED ORDER — INSULIN GLARGINE 100 UNIT/ML ~~LOC~~ SOLN
40.0000 [IU] | Freq: Every day | SUBCUTANEOUS | Status: DC
  Administered 2023-11-02: 40 [IU] via SUBCUTANEOUS
  Filled 2023-11-02: qty 0.4

## 2023-11-02 MED ORDER — VITAMIN B-12 1000 MCG PO TABS
1000.0000 ug | ORAL_TABLET | Freq: Every day | ORAL | Status: DC
  Administered 2023-11-03 – 2023-11-18 (×16): 1000 ug via ORAL
  Filled 2023-11-02 (×16): qty 1

## 2023-11-02 MED ORDER — POLYETHYLENE GLYCOL 3350 17 G PO PACK
34.0000 g | PACK | Freq: Once | ORAL | Status: AC
  Administered 2023-11-02: 34 g via ORAL
  Filled 2023-11-02: qty 2

## 2023-11-02 MED ORDER — ONDANSETRON HCL 4 MG PO TABS
4.0000 mg | ORAL_TABLET | Freq: Four times a day (QID) | ORAL | Status: DC | PRN
  Administered 2023-11-03 – 2023-11-04 (×3): 4 mg via ORAL
  Filled 2023-11-02 (×3): qty 1

## 2023-11-02 MED ORDER — INSULIN ASPART 100 UNIT/ML IJ SOLN: 6.0000 [IU] | Freq: Three times a day (TID) | INTRAMUSCULAR | Status: DC

## 2023-11-02 MED ORDER — FLEET ENEMA RE ENEM
1.0000 | ENEMA | Freq: Once | RECTAL | Status: AC | PRN
  Administered 2023-11-04: 1 via RECTAL
  Filled 2023-11-02: qty 1

## 2023-11-02 MED ORDER — CLOPIDOGREL BISULFATE 75 MG PO TABS: 75.0000 mg | ORAL_TABLET | Freq: Every day | ORAL | Status: DC

## 2023-11-02 MED ORDER — DOCUSATE SODIUM 100 MG PO CAPS
100.0000 mg | ORAL_CAPSULE | Freq: Two times a day (BID) | ORAL | Status: DC
  Administered 2023-11-02 – 2023-11-04 (×5): 100 mg via ORAL
  Filled 2023-11-02 (×8): qty 1

## 2023-11-02 MED ORDER — GUAIFENESIN-DM 100-10 MG/5ML PO SYRP: 10.0000 mL | ORAL_SOLUTION | Freq: Four times a day (QID) | ORAL | Status: DC | PRN

## 2023-11-02 MED ORDER — ASPIRIN 81 MG PO TBEC
81.0000 mg | DELAYED_RELEASE_TABLET | Freq: Every day | ORAL | Status: DC
  Administered 2023-11-03 – 2023-11-18 (×16): 81 mg via ORAL
  Filled 2023-11-02 (×16): qty 1

## 2023-11-02 MED ORDER — DOCUSATE SODIUM 100 MG PO CAPS
100.0000 mg | ORAL_CAPSULE | Freq: Two times a day (BID) | ORAL | Status: DC
  Administered 2023-11-02: 100 mg via ORAL
  Filled 2023-11-02: qty 1

## 2023-11-02 NOTE — Progress Notes (Signed)
 Inpatient Rehabilitation Admissions Coordinator   I have insurance approval and CIR bed to admit her to day. I met at bedside with patient and her sister. They are in agreement. Sister has brought in her insulin pump and Dr Randol Kern to start pump today. Acute team and TOC made aware. I will make the arrangements.  Ottie Glazier, RN, MSN Rehab Admissions Coordinator [phone] 11/02/2023 10:20 AM

## 2023-11-02 NOTE — Inpatient Diabetes Management (Signed)
 Inpatient Diabetes Program Recommendations  AACE/ADA: New Consensus Statement on Inpatient Glycemic Control (2015)  Target Ranges:  Prepandial:   less than 140 mg/dL      Peak postprandial:   less than 180 mg/dL (1-2 hours)      Critically ill patients:  140 - 180 mg/dL   Lab Results  Component Value Date   GLUCAP 298 (H) 11/02/2023   HGBA1C 9.2 (H) 10/29/2023    Review of Glycemic Control  Latest Reference Range & Units 11/01/23 08:02 11/01/23 12:16 11/01/23 16:34 11/01/23 21:11 11/02/23 08:46  Glucose-Capillary 70 - 99 mg/dL 829 (H) 562 (H) 130 (H) 308 (H) 298 (H)   Diabetes history: DM 2 Outpatient Diabetes medications: Tresiba 15 units daily, Omnipod Dash 5 insulin pump Humalog TID with meals as directed, Jardiance 25 mg daily, Bydureon 2 mg weekly  Current orders for Inpatient glycemic control:  Jardiance 25 mg Daily Lantus 40 units Daily Novolog 6 units tid meal coverage Novolog 0-15 units tid + hs  A1c  9.2% on 3/7  Inpatient Diabetes Program Recommendations:    -    Increase Lantus to 50 units -    Increase Novolog meal coverage to 8 units tid if eating >50% of meals  Thanks,  Christena Deem RN, MSN, BC-ADM Inpatient Diabetes Coordinator Team Pager [phone] (8a-5p)

## 2023-11-02 NOTE — H&P (Signed)
 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 Reported new onset of dizziness for approximately one week prior to presentation at ED. [facility]ist telemedicine consult obtained.  Initial workup revealed CT head with aspect score of 10.  Emergent vessel imaging not performed due to anaphylaxis to iodinated contrast.  Per Dr. Wilford Corner, her exam was not consistent with LVO.  He recommended transfer to [facility] for MRI.  She was evaluated by Dr. Amada Jupiter and her MRI was positive for 5 mm acute infarct within the dorsal pons on the right.  MRA with suspected flow-limiting stenosis in the left proximal ICA.Marland Kitchen  She was loaded with Plavix 300 mg and started on aspirin 81 mg.  She also underwent MRA of head and neck.  2D echo with ejection fraction of 60 to 65%, LDL 41, hemoglobin A1c 9.2%.  She was started on Lovenox for VTE prophylaxis.  She will continue on DAPT for 3 weeks followed by Plavix alone.   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  She is being treated with tamoxifen.  She has a history of ocular myasthenia gravis and a history of thyroid cancer status post radiation and total thyroidectomy.  She has an insulin pump which will be reinitiated on admission to CIR.   She is requiring grossly min assist for bed mobility, transfers and gait with rolling walker for support.  Assist needed to manage right lower extremity in bed and during gait due to right lower extremity weakness.  Mild right inattention.  Rating carb modified diet with thin liquids. The patient requires inpatient medicine and  rehabilitation evaluations and services for ongoing dysfunction secondary to right pontine infarct.     Needs to make sure getting her Tamoxifen     Review of Systems  Constitutional:  Positive for malaise/fatigue.  HENT:  Positive for hearing loss.   Eyes:        Needs to get new eyeglasses- vision stable though  Respiratory:  Positive for cough. Negative for sputum production and shortness of breath.   Cardiovascular:  Negative for chest pain and leg swelling.       Mild swelling of LUE- hasn't been keeping elevated on pillow  Gastrointestinal:  Positive for nausea. Negative for constipation.       LBM Sunday, but is normal for her- denies constipation Dizzy and nauseated when stands with therapy  Genitourinary: Negative.   Musculoskeletal:  Positive for myalgias.       RLE and RUE painful- throbbing pain  Skin: Negative.   Neurological:  Positive for sensory change, focal weakness and headaches.       HA's bad before newest stroke, but worse since admission- 5-6/10 daily- was on Topamax at home already- cannot tolerate AM topamax dose- too sedating  Endo/Heme/Allergies: Negative.   Psychiatric/Behavioral:  The patient has insomnia.   All other systems reviewed and are negative.       Past Medical History:  Diagnosis Date   Arthritis      "all over my body" (03/13/2013)   Asthma     Asymptomatic carotid artery stenosis, bilateral 10/08/2018   Breast cancer (HCC)  GERD (gastroesophageal reflux disease)     H/O hiatal hernia     Headache      pt states she has had headaches for about 6 months "off and on"   Heart murmur      pt had echocardiogram on 09/17/21   Hypercholesterolemia 10/08/2018   Hypertension     Hypothyroidism     Myasthenia gravis (HCC)      "in my eyes; diagnsosed > 7 yr ago" (03/13/2013)   Sleep apnea      on CPAP   Stroke (HCC) 2021   Thyroid carcinoma (HCC)     Type II diabetes mellitus (HCC)               Past Surgical History:  Procedure  Laterality Date   ABDOMINAL HYSTERECTOMY       ANTERIOR CERVICAL DECOMP/DISCECTOMY FUSION        "I've had severa ORs; always went in from the front" (03/13/2013)   APPENDECTOMY       BREAST LUMPECTOMY WITH RADIOACTIVE SEED AND SENTINEL LYMPH NODE BIOPSY Left 09/02/2022    Procedure: LEFT BREAST LUMPECTOMY WITH RADIOACTIVE SEED AND SENTINEL LYMPH NODE BIOPSY;  Surgeon: Abigail Miyamoto, MD;  Location: [facility] OR;  Service: General;  Laterality: Left;   CARDIAC CATHETERIZATION        "several" (03/13/2013)   CARPAL TUNNEL RELEASE Right     CATARACT EXTRACTION W/ INTRAOCULAR LENS  IMPLANT, BILATERAL Bilateral     CHOLECYSTECTOMY       KNEE ARTHROSCOPY Left     SHOULDER ARTHROSCOPY W/ ROTATOR CUFF REPAIR Left     TONSILLECTOMY       TOTAL THYROIDECTOMY       TRANSCAROTID ARTERY REVASCULARIZATION  Left 09/24/2021    Procedure: LEFT TRANSCAROTID ARTERY REVASCULARIZATION;  Surgeon: Leonie Douglas, MD;  Location: [facility] OR;  Service: Vascular;  Laterality: Left;   TRANSCAROTID ARTERY REVASCULARIZATION  Right 10/23/2021    Procedure: Right Transcarotid Artery Revascularization;  Surgeon: Leonie Douglas, MD;  Location: [facility] OR;  Service: Vascular;  Laterality: Right;   ULTRASOUND GUIDANCE FOR VASCULAR ACCESS Right 09/24/2021    Procedure: ULTRASOUND GUIDANCE FOR VASCULAR ACCESS;  Surgeon: Leonie Douglas, MD;  Location: [facility] OR;  Service: Vascular;  Laterality: Right;   ULTRASOUND GUIDANCE FOR VASCULAR ACCESS Right 10/23/2021    Procedure: ULTRASOUND GUIDANCE FOR VASCULAR ACCESS, LEFT FEMORAL VEIN;  Surgeon: Leonie Douglas, MD;  Location: [facility] OR;  Service: Vascular;  Laterality: Right;             Family History  Problem Relation Age of Onset   Coronary artery disease Sister          s/p coronary stenting   Stroke Sister 75   Diabetes Sister     Congestive Heart Failure Mother     Asthma Mother     Diabetes Mother     Congestive Heart Failure Father     Lung cancer Father     Asthma Father      Diabetes Father     Diabetes Brother          Social History:  reports that she has never smoked. She has never used smokeless tobacco. She reports that she does not drink alcohol and does not use drugs. Allergies:  Allergies       Allergies  Allergen Reactions   Contrast Media [Iodinated Contrast Media] Anaphylaxis      01/10/15 and 09/18/2021 --PT GIVEN 13 HR PRE MEDS FOR CT  with contrast TOLERATED IV CONTRAST W/O ANY REACTION   Fluorescein Shortness Of Breath and Other (See Comments)      (Dye)   Iodine Anaphylaxis      Contrast dye - iodine   Magnesium-Containing Compounds Other (See Comments)      H/O myasthenia gravis- caution replacing IV    Molds & Smuts Shortness Of Breath and Other (See Comments)      Congestion and wheezing, also   Shellfish-Derived Products Anaphylaxis, Shortness Of Breath, Swelling and Other (See Comments)      Welts, also   Pholcodine Hives   Azithromycin Nausea Only   Codeine Hives and Nausea Only   Metformin Hcl Er Nausea Only   Oxycodone Nausea Only      Nausea 08/2022 admission.   Tramadol Hcl Nausea Only   Other Rash and Other (See Comments)      Coban causes welts, also            Medications Prior to Admission  Medication Sig Dispense Refill   acetaminophen (TYLENOL) 500 MG tablet Take 500 mg by mouth every 6 (six) hours as needed for moderate pain.       aspirin EC 81 MG EC tablet Take 1 tablet (81 mg total) by mouth daily.       Biotin 5000 MCG CAPS Take 5,000 mcg by mouth daily with breakfast.        cycloSPORINE (RESTASIS) 0.05 % ophthalmic emulsion Place 1 drop into both eyes 2 (two) times daily. 0.4 mL 0   diclofenac Sodium (VOLTAREN) 1 % GEL Apply 2 g topically 4 (four) times daily. (Patient taking differently: Apply 1 application  topically daily as needed (pain).) 2 g 0   diphenhydrAMINE 25 mg in sodium chloride 0.9 % 50 mL Inject 25 mg into the vein See admin instructions. Administer 25 mg intravenously at start of Gamunex  infusion, then administer 25 mg during infusion       diphenoxylate-atropine (LOMOTIL) 2.5-0.025 MG tablet Take 1-2 tablets by mouth 4 (four) times daily as needed for diarrhea or loose stools. 30 tablet 2   ELDERBERRY PO Take 1 tablet by mouth daily.       empagliflozin (JARDIANCE) 25 MG TABS tablet Take 25 mg by mouth daily.       esomeprazole (NEXIUM) 40 MG capsule Take 1 capsule (40 mg total) by mouth 2 (two) times daily. 60 capsule 0   Evolocumab with Infusor (REPATHA PUSHTRONEX SYSTEM) 420 MG/3.5ML SOCT Inject 420 mg into the skin every 30 (thirty) days.       Exenatide ER (BYDUREON BCISE) 2 MG/0.85ML AUIJ Inject 2 mg into the skin every Monday.       ezetimibe (ZETIA) 10 MG tablet TAKE 1 TABLET BY MOUTH EVERY DAY 90 tablet 1   gabapentin (NEURONTIN) 100 MG capsule Take 100-200 mg by mouth at bedtime.       Immune Globulin, Human, 40 GM/400ML SOLN Inject into the vein every 6 (six) weeks. Infusions are administered at home by home health nurse - last infusion 09/08/21 and 09/09/21; next infusion due 10/19/21 and 10/20/21       insulin degludec (TRESIBA FLEXTOUCH) 100 UNIT/ML FlexTouch Pen Inject 15 Units into the skin in the morning.       insulin lispro (HUMALOG) 100 UNIT/ML injection Inject into the skin See admin instructions. Inject subcutaneously 3 (three) times daily with meals With meals through the Omnipod Dash Pump every 72 hours as directed  Levothyroxine Sodium 200 MCG CAPS Take 200 mcg by mouth daily before breakfast.       lidocaine-prilocaine (EMLA) cream Apply to affected area once 30 g 3   loperamide (IMODIUM A-D) 2 MG tablet Take 2 mg by mouth 4 (four) times daily as needed for diarrhea or loose stools.       loratadine (CLARITIN) 10 MG tablet Take 1 tablet (10 mg total) by mouth at bedtime. 30 tablet 0   meclizine (ANTIVERT) 25 MG tablet Take 25 mg by mouth 3 (three) times daily as needed for dizziness or nausea (or migraine-related nausea).       montelukast (SINGULAIR) 10  MG tablet Take 1 tablet (10 mg total) by mouth every morning. 30 tablet 0   mycophenolate (CELLCEPT) 500 MG tablet Take 1,000 mg by mouth 2 (two) times daily. For myasthenia gravis       olmesartan (BENICAR) 20 MG tablet Take 1 tablet (20 mg total) by mouth daily. 30 tablet 0   ondansetron (ZOFRAN) 8 MG tablet TAKE 1 TABLET BY MOUTH EVERY 8  HOURS AS NEEDED FOR NAUSEA AND  VOMITING 60 tablet 1   predniSONE (DELTASONE) 2.5 MG tablet Take 7.5 mg by mouth every morning. For myasthenia gravis       pregabalin (LYRICA) 25 MG capsule Take 1 capsule (25 mg total) by mouth 2 (two) times daily. 180 capsule 0   PRESCRIPTION MEDICATION Pt uses a cpap nightly       PROAIR HFA 108 (90 BASE) MCG/ACT inhaler Inhale 2 puffs into the lungs every 6 (six) hours as needed for wheezing or shortness of breath.        prochlorperazine (COMPAZINE) 10 MG tablet Take 1 tablet (10 mg total) by mouth every 6 (six) hours as needed for nausea or vomiting. 30 tablet 1   riTUXimab (RITUXAN) 500 MG/50ML injection Inject into the vein every 6 (six) months.       rosuvastatin (CRESTOR) 40 MG tablet Take 1 tablet (40 mg total) by mouth daily. 90 tablet 3   tamoxifen (NOLVADEX) 20 MG tablet Take 1 tablet (20 mg total) by mouth daily. 90 tablet 1   topiramate (TOPAMAX) 50 MG tablet TAKE 1 TABLET BY MOUTH AT  BEDTIME 100 tablet 0   traMADol (ULTRAM) 50 MG tablet Take 1 tablet (50 mg total) by mouth every 6 (six) hours as needed for moderate pain. 25 tablet 0   vitamin B-12 (CYANOCOBALAMIN) 1000 MCG tablet Take 1,000 mcg by mouth once a week.       Vitamin D, Ergocalciferol, (DRISDOL) 50000 UNITS CAPS capsule Take 50,000 Units by mouth every Monday.                   Home: Home Living Family/patient expects to be discharged to:: Private residence Living Arrangements: Alone Available Help at Discharge: Family, Available 24 hours/day Type of Home: House Home Access: Stairs to enter [employer] of Steps: 2 Entrance  Stairs-Rails: None Home Layout: One level Bathroom Shower/Tub: Engineer, manufacturing systems: Standard Bathroom Accessibility: Yes Home Equipment: Agricultural consultant (2 wheels), Tub bench, Grab bars - tub/shower   Functional History: Prior Function Prior Level of Function : Independent/Modified Independent Mobility Comments: walks with RW ADLs Comments: ind, cooks/cleans, manages meds   Functional Status:  Mobility: Bed Mobility Overal bed mobility: Needs Assistance Bed Mobility: Supine to Sit, Sit to Supine Supine to sit: Min assist Sit to supine: Min assist General bed mobility comments: Min A to assist BLEs to  EOB and return to bed Transfers Overall transfer level: Needs assistance Equipment used: Rolling walker (2 wheels) Transfers: Sit to/from Stand Sit to Stand: Min assist General transfer comment: up from tall stretcher in ED Ambulation/Gait Ambulation/Gait assistance: Min assist, Mod assist Gait Distance (Feet): 20 Feet Assistive device: Rolling walker (2 wheels) Gait Pattern/deviations: Step-to pattern, Decreased step length - right, Decreased stride length, Drifts right/left, Decreased dorsiflexion - right General Gait Details: assist to progress the R LE, assist for balance and walker management on turns   ADL: ADL Overall ADL's : Needs assistance/impaired Eating/Feeding: Sitting, Minimal assistance Eating/Feeding Details (indicate cue type and reason): able to use LUE to feed, gave pt red tubing for built up handles when needed for RUE Grooming: Wash/dry face, Standing, Contact guard assist Grooming Details (indicate cue type and reason): incorporating bilat intergation Upper Body Bathing: Sitting, Set up Lower Body Bathing: Sitting/lateral leans, Minimal assistance Upper Body Dressing : Sitting, Moderate assistance Upper Body Dressing Details (indicate cue type and reason): assists with RUE Lower Body Dressing: Moderate assistance, Sitting/lateral leans Toilet  Transfer: Minimal assistance, Rolling walker (2 wheels), Ambulation Toileting- Clothing Manipulation and Hygiene: Sit to/from stand, Minimal assistance Functional mobility during ADLs: Minimal assistance, Rolling walker (2 wheels)   Cognition: Cognition Orientation Level: Oriented X4 Cognition Arousal: Alert Behavior During Therapy: WFL for tasks assessed/performed   Physical Exam: Blood pressure (!) 152/62, pulse 95, temperature 98.1 F (36.7 C), temperature source Oral, resp. rate 18, weight 90.5 kg, SpO2 98%. Physical Exam Vitals and nursing note reviewed. Exam conducted with a chaperone present.  Constitutional:      Appearance: Normal appearance. She is obese.     Comments: Sitting up in bedside chair- niece at bedside; just finished lunch 100%- awake, alert, appropriate, NAD  HENT:     Head: Normocephalic and atraumatic.     Comments: Decreased to light touch from nose downwards- R side    Nose: Nose normal. No congestion.     Mouth/Throat:     Mouth: Mucous membranes are dry.     Pharynx: Oropharynx is clear. No oropharyngeal exudate.  Eyes:     General:        Right eye: No discharge.        Left eye: No discharge.     Comments: Dysconjugate gaze L upper visual field cut- is chronic per pt No nystagmus  Cardiovascular:     Rate and Rhythm: Normal rate and regular rhythm.     Heart sounds: Normal heart sounds. No murmur heard.    No gallop.     Comments: Rate in 90's Pulmonary:     Effort: Pulmonary effort is normal. No respiratory distress.     Breath sounds: Normal breath sounds. No wheezing, rhonchi or rales.     Comments: Cough with deep breaths Abdominal:     General: Bowel sounds are normal. There is no distension.     Palpations: Abdomen is soft.     Tenderness: There is no abdominal tenderness.  Musculoskeletal:        General: Normal range of motion.     Cervical back: Neck supple. No tenderness.     Comments:  RUE- biceps 2+/5; Triceps 2/5; WE 3-/5;  Grip 2/5; FA 1/5 LUE- 5/5 except FA 4/5 RLE- HF 1/5; KE 2-/5 DF/PF 0/5;  LLE-5-/5  Skin:    General: Skin is warm and dry.     Comments: LUE- swelling- mild in hand- not elevated Port R chest- not accessed  Neurological:  Mental Status: She is alert and oriented to person, place, and time.     Comments: Decreased on L side throughout   Psychiatric:        Mood and Affect: Mood normal.        Behavior: Behavior normal.        Lab Results Last 48 Hours        Results for orders placed or performed during the hospital encounter of 10/29/23 (from the past 48 hours)  Glucose, capillary     Status: Abnormal    Collection Time: 10/31/23 11:50 AM  Result Value Ref Range    Glucose-Capillary 248 (H) 70 - 99 mg/dL      Comment: Glucose reference range applies only to samples taken after fasting for at least 8 hours.  Glucose, capillary     Status: Abnormal    Collection Time: 10/31/23  3:36 PM  Result Value Ref Range    Glucose-Capillary 341 (H) 70 - 99 mg/dL      Comment: Glucose reference range applies only to samples taken after fasting for at least 8 hours.  Glucose, capillary     Status: Abnormal    Collection Time: 10/31/23  9:01 PM  Result Value Ref Range    Glucose-Capillary 337 (H) 70 - 99 mg/dL      Comment: Glucose reference range applies only to samples taken after fasting for at least 8 hours.  Glucose, capillary     Status: Abnormal    Collection Time: 11/01/23  8:02 AM  Result Value Ref Range    Glucose-Capillary 271 (H) 70 - 99 mg/dL      Comment: Glucose reference range applies only to samples taken after fasting for at least 8 hours.  T4, free     Status: Abnormal    Collection Time: 11/01/23  9:42 AM  Result Value Ref Range    Free T4 2.22 (H) 0.61 - 1.12 ng/dL      Comment: (NOTE) Biotin ingestion may interfere with free T4 tests. If the results are inconsistent with the TSH level, previous test results, or the clinical presentation, then consider biotin  interference. If needed, order repeat testing after stopping biotin. Performed at [facility], [address], [place], [place] [address]    TSH     Status: Abnormal    Collection Time: 11/01/23  9:43 AM  Result Value Ref Range    TSH 0.012 (L) 0.350 - 4.500 uIU/mL      Comment: Performed by a 3rd Generation assay with a functional sensitivity of <=0.01 uIU/mL. Performed at [facility], [address], [place], [place] [address]    Basic metabolic panel     Status: Abnormal    Collection Time: 11/01/23  9:43 AM  Result Value Ref Range    Sodium 138 135 - 145 mmol/L    Potassium 4.0 3.5 - 5.1 mmol/L    Chloride 106 98 - 111 mmol/L    CO2 24 22 - 32 mmol/L    Glucose, Bld 296 (H) 70 - 99 mg/dL      Comment: Glucose reference range applies only to samples taken after fasting for at least 8 hours.    BUN 16 8 - 23 mg/dL    Creatinine, Ser 0.98 (H) 0.44 - 1.00 mg/dL    Calcium 9.2 8.9 - 11.9 mg/dL    GFR, Estimated 55 (L) >60 mL/min      Comment: (NOTE) Calculated using the CKD-EPI Creatinine Equation (  2021)      Anion gap 8 5 - 15      Comment: Performed at [facility], [address], [place], [place] [address]  Glucose, capillary     Status: Abnormal    Collection Time: 11/01/23 12:16 PM  Result Value Ref Range    Glucose-Capillary 269 (H) 70 - 99 mg/dL      Comment: Glucose reference range applies only to samples taken after fasting for at least 8 hours.  Glucose, capillary     Status: Abnormal    Collection Time: 11/01/23  4:34 PM  Result Value Ref Range    Glucose-Capillary 336 (H) 70 - 99 mg/dL      Comment: Glucose reference range applies only to samples taken after fasting for at least 8 hours.  Glucose, capillary     Status: Abnormal    Collection Time: 11/01/23  9:11 PM  Result Value Ref Range    Glucose-Capillary 308 (H) 70 - 99 mg/dL      Comment: Glucose reference range applies only to samples taken after fasting for at least 8 hours.   Glucose, capillary     Status: Abnormal    Collection Time: 11/02/23  8:46 AM  Result Value Ref Range    Glucose-Capillary 298 (H) 70 - 99 mg/dL      Comment: Glucose reference range applies only to samples taken after fasting for at least 8 hours.      Imaging Results (Last 48 hours)  No results found.         Blood pressure (!) 152/62, pulse 95, temperature 98.1 F (36.7 C), temperature source Oral, resp. rate 18, weight 90.5 kg, SpO2 98%.   Medical Problem List and Plan: 1. Functional deficits secondary to R pontine CVA             -patient may  shower= as long as port not accessed             -ELOS/Goals: 7-10 days min A to supervision             Admit to CIR 2.  Antithrombotics: -DVT/anticoagulation:  Pharmaceutical: Lovenox             -antiplatelet therapy: Aspirin and Plavix for three weeks followed by Plavix alone (has ICA stent)   3. Pain Management: Tylenol as needed             -Lyrica 75 mg BID   4. Mood/Behavior/Sleep: LCSW to evaluate and provide emotional support             -antipsychotic agents: n/a   5. Neuropsych/cognition: This patient is capable of making decisions on her own behalf.   6. Skin/Wound Care: Routine skin care checks   7. 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 at home) - likely need to increase to 100 mg QHS_ having HA's 5-6/10 daily now   14: OSA on CPAP   15: History of breast cancer: continue tamoxifen   16: CKD stage II: Serum creatinine at or near baseline -follow-up BMP   17: Chronic anemia: follow-up CBC   18: Leukopenia: follow-up CBC     19. Dizzy/nauseated with standing- not clear if orthostatic hypotension- needs further work up/to be addressed   Of note, more weakness seen on R side, with R pontine stroke- not clear if from her prior strokes or not- per pt this is her third   Milinda Antis, PA-C 11/02/2023     I have personally performed a face to face diagnostic evaluation of this patient and formulated the key components of the plan.  Additionally, I have personally reviewed laboratory data, imaging studies, as well as relevant notes and concur with the physician assistant's documentation above.   The patient's status has not changed from the original H&P.  Any changes in documentation from the acute care chart have been noted above.

## 2023-11-02 NOTE — TOC CM/SW Note (Signed)
 Transition of Care [facility]) - Inpatient Brief Assessment   Patient Details  Name: [name] MRN: [id number] Date of Birth: Jan 09, 1951  Transition of Care [facility]) CM/SW Contact:    Mearl Latin, LCSW Phone Number: 11/02/2023, 8:52 AM   Clinical Narrative: TOC following for insurance determination on CIR.   Transition of Care Asessment: Insurance and Status: Insurance coverage has been reviewed Patient has primary care physician: Yes Home environment has been reviewed: yes Patient lives alone Prior level of function:: Walks with walker Prior/Current Home Services: No current home services Social Drivers of Health Review: SDOH reviewed no interventions necessary Readmission risk has been reviewed: Yes Transition of care needs: transition of care needs identified, TOC will continue to follow

## 2023-11-02 NOTE — Inpatient Diabetes Management (Signed)
 Inpatient Diabetes Program Recommendations  AACE/ADA: New Consensus Statement on Inpatient Glycemic Control (2015)  Target Ranges:  Prepandial:   less than 140 mg/dL      Peak postprandial:   less than 180 mg/dL (1-2 hours)      Critically ill patients:  140 - 180 mg/dL   Lab Results  Component Value Date   GLUCAP 332 (H) 11/02/2023   HGBA1C 9.2 (H) 10/29/2023    Review of Glycemic Control  Supervised pt placing Dexcom G6 on left arm and Omnipod insulin pump on right arm. Glucose at current time is 332. Monitored pt bolusing 14.1 units from pump. No administration time offered in [facility] at this time as administration time starts at 1600 today.  Pt with extra supplies and insulin at bedside.  Thanks,  Christena Deem RN, MSN, BC-ADM Inpatient Diabetes Coordinator Team Pager [phone] ([phone]-5p)

## 2023-11-02 NOTE — Discharge Summary (Signed)
 Physician Discharge Summary  [name] UJW:[id number] DOB: [birth year] DOA: 10/29/2023  PCP: Irena Reichmann, DO  Admit date: 10/29/2023 Discharge date: 11/02/2023  Admitted From: (Home) Disposition:  (CIR)  Recommendations for Outpatient Follow-up:  Follow up with PCP in 1-2 weeks Please obtain BMP/CBC in one week recommendation for dual antiplatelet therapy aspirin and Plavix x 3 weeks then Plavix alone  Insulin pump will be initiated prior to discharge to CIR, please monitor CBG closely over next 24 hours as she received her long-acting insulin. Recheck level in 4 to 6 weeks and adjust further as needed   Diet recommendation: Heart Healthy / Carb Modified  Brief/Interim Summary: The patient is a 73 year old African-American female with past medical history significant for CVA, history of insulin-dependent diabetes mellitus type 2 on insulin pump, hypertension, hyperlipidemia, myasthenia gravis, OSA, GERD, history of thyroid cancer status post radiation and total thyroidectomy, history of breast cancer status postlumpectomy and adjuvant chemotherapy and radiation who presented to the ED with right-sided weakness and numbness. Further workup was done and she is found to have an acute CVA and undergoing a stroke workup. Neurology consulted and PT OT recommending CIR .    Ischemic infarct, right pontine infarct -Secondary to small vessel disease -MRA Suspected flow-limiting stenosis of the left proximal ICA.  -MRI  5 mm acute infarct within the dorsal pons on the right.  -2D Echo EF 60-65%. LV grade I diastolic dysfunction -US carotid right ICA are consistent with a 40-59% stenosis. Left Carotid: Patent ICA stent with some narrowing noted proximal through mid stent  -LDL 41 -HgbA1c 9.2 -VTE prophylaxis - Lovenox -Neurology input greatly appreciated, she is on aspirin 81 mg oral daily at home, recommendation for dual antiplatelet therapy aspirin and Plavix x 3 weeks then Plavix alone       Essential Hypertension: - Home medication include olmesartan 20 g p.o. daily, initial recommendation for permissive hypertension, but now can resume her home meds.     Hyperlipidemia:  LDL is 41, continue with rosuvastatin, 40 mg mg p.o. daily, ezetimibe 10 mg p.o. daily and Repatha   Uncontrolled controlled diabetes mellitus type 2: - Insulin pump at home and Jardiance.  Hemoglobin A1c with here was 9.2.  He was started on long-acting insulin during hospital stay, and insulin sliding scale, her CBG difficult to control, dose has to be uptitrated significantly, she was resumed on her home Jardiance as well, family were able to get her insulin pump today, so she is to start it prior to going to CIR, received 40 units of Lantus this morning, close monitoring of CBG over next 24 hours,.   Normocytic Anemia/Thrombocytopenia:  -Stable, at baseline, no indication for transfusions  -Folic acid and B12 within normal level   AKI on CKD stage 2 Metabolic acidosis -Is a slight metabolic acidosis with a CO2 of 19, anion gap of 6, chloride level 110 -Avoid Nephrotoxic Medications, Contrast Dyes, Hypotension and Dehydration to Ensure Adequate Renal Perfusion and will need to Renally Adjust Meds   Hypothyroidism:  -TSH level significantly low 0.012 2, she is status post thyroidectomy due to thyroid cancer, so we would like to target low TSH level, but this is significantly low, T4 is elevated at 2.2, decrease Synthroid from 200 mcg to 100 mcg -Recheck level in 4 to 6 weeks and adjust further as needed   History of breast cancer -Status post surgery, and radiation last year, -Continue with tamoxifen   Ocular Myasthenia Gravis:  -C/w family at 1000  g p.o. twice daily and Rituxumab and gets IVIG q6weeks and follows [facility] as outpatient  -Resume home dose prednisone   Hypokalemia -Replaced   OSA: C/w CPAP   GERD/GI Prophylaxis: - on PPI   Class II Obesity:  Body mass index is 35.36 kg/m.     Discharge Diagnoses:  Principal Problem:   Stroke [facility]) Active Problems:   Essential hypertension, benign   OSA on CPAP   Myasthenia gravis in remission (HCC)   Gastroesophageal reflux disease without esophagitis   Hyperlipidemia associated with type 2 diabetes mellitus (HCC)   Insulin dependent type 2 diabetes mellitus [facility])    Discharge Instructions  Discharge Instructions     Diet - low sodium heart healthy   Complete by: As directed    Discharge instructions   Complete by: As directed    Follow with Primary MD Irena Reichmann, DO in 7 days   Get CBC, CMP,  checked  by Primary MD next visit.    Activity: As tolerated with Full fall precautions use walker/cane & assistance as needed   Disposition CIR   Diet: Heart Healthy/carb modified   On your next visit with your primary care physician please Get Medicines reviewed and adjusted.   Please request your Prim.MD to go over all Hospital Tests and Procedure/Radiological results at the follow up, please get all Hospital records sent to your Prim MD by signing hospital release before you go home.   If you experience worsening of your admission symptoms, develop shortness of breath, life threatening emergency, suicidal or homicidal thoughts you must seek medical attention immediately by calling 911 or calling your MD immediately  if symptoms less severe.  You Must read complete instructions/literature along with all the possible adverse reactions/side effects for all the Medicines you take and that have been prescribed to you. Take any new Medicines after you have completely understood and accpet all the possible adverse reactions/side effects.   Do not drive, operating heavy machinery, perform activities at heights, swimming or participation in water activities or provide baby sitting services if your were admitted for syncope or siezures until you have seen by Primary MD or a Neurologist and advised to do so again.  Do  not drive when taking Pain medications.    Do not take more than prescribed Pain, Sleep and Anxiety Medications  Special Instructions: If you have smoked or chewed Tobacco  in the last 2 yrs please stop smoking, stop any regular Alcohol  and or any Recreational drug use.  Wear Seat belts while driving.   Please note  You were cared for by a hospitalist during your hospital stay. If you have any questions about your discharge medications or the care you received while you were in the hospital after you are discharged, you can call the unit and asked to speak with the hospitalist on call if the hospitalist that took care of you is not available. Once you are discharged, your primary care physician will handle any further medical issues. Please note that NO REFILLS for any discharge medications will be authorized once you are discharged, as it is imperative that you return to your primary care physician (or establish a relationship with a primary care physician if you do not have one) for your aftercare needs so that they can reassess your need for medications and monitor your lab values.   Increase activity slowly   Complete by: As directed       Allergies as of 11/02/2023  Reactions   Contrast Media [iodinated Contrast Media] Anaphylaxis   01/10/15 and 09/18/2021 --PT GIVEN 13 HR PRE MEDS FOR CT with contrast TOLERATED IV CONTRAST W/O ANY REACTION   Fluorescein Shortness Of Breath, Other (See Comments)   (Dye)   Iodine Anaphylaxis   Contrast dye - iodine   Magnesium-containing Compounds Other (See Comments)   H/O myasthenia gravis- caution replacing IV    Molds & Smuts Shortness Of Breath, Other (See Comments)   Congestion and wheezing, also   Shellfish-derived Products Anaphylaxis, Shortness Of Breath, Swelling, Other (See Comments)   Welts, also   Pholcodine Hives   Azithromycin Nausea Only   Codeine Hives, Nausea Only   Metformin Hcl Er Nausea Only   Oxycodone Nausea Only    Nausea 08/2022 admission.   Tramadol Hcl Nausea Only   Other Rash, Other (See Comments)   Coban causes welts, also        Medication List     STOP taking these medications    Biotin 5000 MCG Caps   cycloSPORINE 0.05 % ophthalmic emulsion Commonly known as: RESTASIS   diclofenac Sodium 1 % Gel Commonly known as: VOLTAREN   diphenhydrAMINE 25 mg in sodium chloride 0.9 % 50 mL   gabapentin 100 MG capsule Commonly known as: NEURONTIN   Levothyroxine Sodium 200 MCG Caps Replaced by: levothyroxine 100 MCG tablet   traMADol 50 MG tablet Commonly known as: Vedia Pereyra FlexTouch 100 UNIT/ML FlexTouch Pen Generic drug: insulin degludec       TAKE these medications    acetaminophen 500 MG tablet Commonly known as: TYLENOL Take 500 mg by mouth every 6 (six) hours as needed for moderate pain.   aspirin EC 81 MG tablet Take 1 tablet (81 mg total) by mouth daily for 18 days.   Bydureon BCise 2 MG/0.85ML Auij Generic drug: Exenatide ER Inject 2 mg into the skin every Monday.   clopidogrel 75 MG tablet Commonly known as: PLAVIX Take 1 tablet (75 mg total) by mouth daily. Start taking on: November 03, 2023   cyanocobalamin 1000 MCG tablet Commonly known as: VITAMIN B12 Take 1,000 mcg by mouth once a week.   diphenoxylate-atropine 2.5-0.025 MG tablet Commonly known as: LOMOTIL Take 1-2 tablets by mouth 4 (four) times daily as needed for diarrhea or loose stools.   ELDERBERRY PO Take 1 tablet by mouth daily.   empagliflozin 25 MG Tabs tablet Commonly known as: JARDIANCE Take 25 mg by mouth daily.   esomeprazole 40 MG capsule Commonly known as: NEXIUM Take 1 capsule (40 mg total) by mouth 2 (two) times daily.   ezetimibe 10 MG tablet Commonly known as: ZETIA TAKE 1 TABLET BY MOUTH EVERY DAY   Immune Globulin (Human) 40 GM/400ML Soln Inject into the vein every 6 (six) weeks. Infusions are administered at home by home health nurse - last infusion 09/08/21 and  09/09/21; next infusion due 10/19/21 and 10/20/21   insulin lispro 100 UNIT/ML injection Commonly known as: HUMALOG Inject into the skin See admin instructions. Inject subcutaneously 3 (three) times daily with meals With meals through the Omnipod Dash Pump every 72 hours as directed   levothyroxine 100 MCG tablet Commonly known as: SYNTHROID Take 1 tablet (100 mcg total) by mouth daily at 6 (six) AM. Start taking on: November 03, 2023 Replaces: Levothyroxine Sodium 200 MCG Caps   lidocaine-prilocaine cream Commonly known as: EMLA Apply to affected area once   loperamide 2 MG tablet Commonly known as: IMODIUM A-D Take  2 mg by mouth 4 (four) times daily as needed for diarrhea or loose stools.   loratadine 10 MG tablet Commonly known as: CLARITIN Take 1 tablet (10 mg total) by mouth at bedtime.   meclizine 25 MG tablet Commonly known as: ANTIVERT Take 25 mg by mouth 3 (three) times daily as needed for dizziness or nausea (or migraine-related nausea).   montelukast 10 MG tablet Commonly known as: SINGULAIR Take 1 tablet (10 mg total) by mouth every morning.   mycophenolate 500 MG tablet Commonly known as: CELLCEPT Take 1,000 mg by mouth 2 (two) times daily. For myasthenia gravis   olmesartan 20 MG tablet Commonly known as: BENICAR Take 1 tablet (20 mg total) by mouth daily.   ondansetron 8 MG tablet Commonly known as: ZOFRAN TAKE 1 TABLET BY MOUTH EVERY 8  HOURS AS NEEDED FOR NAUSEA AND  VOMITING   predniSONE 2.5 MG tablet Commonly known as: DELTASONE Take 7.5 mg by mouth every morning. For myasthenia gravis   pregabalin 25 MG capsule Commonly known as: Lyrica Take 1 capsule (25 mg total) by mouth 2 (two) times daily.   PRESCRIPTION MEDICATION Pt uses a cpap nightly   ProAir HFA 108 (90 Base) MCG/ACT inhaler Generic drug: albuterol Inhale 2 puffs into the lungs every 6 (six) hours as needed for wheezing or shortness of breath.   prochlorperazine 10 MG tablet Commonly  known as: COMPAZINE Take 1 tablet (10 mg total) by mouth every 6 (six) hours as needed for nausea or vomiting.   Repatha Pushtronex System 420 MG/3.5ML Soct Generic drug: Evolocumab with Infusor Inject 420 mg into the skin every 30 (thirty) days.   Rituxan 500 MG/50ML injection Generic drug: riTUXimab Inject into the vein every 6 (six) months.   rosuvastatin 40 MG tablet Commonly known as: CRESTOR Take 1 tablet (40 mg total) by mouth daily.   tamoxifen 20 MG tablet Commonly known as: NOLVADEX Take 1 tablet (20 mg total) by mouth daily.   topiramate 50 MG tablet Commonly known as: TOPAMAX TAKE 1 TABLET BY MOUTH AT  BEDTIME   Vitamin D (Ergocalciferol) 1.25 MG (50000 UNIT) Caps capsule Commonly known as: DRISDOL Take 50,000 Units by mouth every Monday.        Allergies  Allergen Reactions   Contrast Media [Iodinated Contrast Media] Anaphylaxis    01/10/15 and 09/18/2021 --PT GIVEN 13 HR PRE MEDS FOR CT with contrast TOLERATED IV CONTRAST W/O ANY REACTION   Fluorescein Shortness Of Breath and Other (See Comments)    (Dye)   Iodine Anaphylaxis    Contrast dye - iodine   Magnesium-Containing Compounds Other (See Comments)    H/O myasthenia gravis- caution replacing IV    Molds & Smuts Shortness Of Breath and Other (See Comments)    Congestion and wheezing, also   Shellfish-Derived Products Anaphylaxis, Shortness Of Breath, Swelling and Other (See Comments)    Welts, also   Pholcodine Hives   Azithromycin Nausea Only   Codeine Hives and Nausea Only   Metformin Hcl Er Nausea Only   Oxycodone Nausea Only    Nausea 08/2022 admission.   Tramadol Hcl Nausea Only   Other Rash and Other (See Comments)    Coban causes welts, also    Consultations: Neurology Rehab   Procedures/Studies: VAS US CAROTID Result Date: 10/30/2023 Carotid Arterial Duplex Study Patient Name:  [name]  Date of Exam:   10/30/2023 Medical Rec #: [id number]        Accession #:  [id number] Date  of Birth: 04-19-1951         Patient Gender: F Patient Age:   21 years Exam Location:  [facility] Procedure:      VAS US CAROTID Referring Phys: Elmer Picker --------------------------------------------------------------------------------  Indications:       CVA, Carotid artery disease and Bilateral stents--Left TCAR                    09/24/21 Left TCAR 10/23/21. Risk Factors:      Hypertension, hyperlipidemia, Diabetes, prior CVA. Other Factors:     Myasthenia Gravis, Tyroid cancer with radiation 2005 and                    thyroidectomy 2014, Breast cancer with chemo 12/23. Comparison Study:  Prior carotid duplex done 12/09/21 indicated patent bilateral                    ICA stents. Performing Technologist: Sherren Kerns RVS  Examination Guidelines: A complete evaluation includes B-mode imaging, spectral Doppler, color Doppler, and power Doppler as needed of all accessible portions of each vessel. Bilateral testing is considered an integral part of a complete examination. Limited examinations for reoccurring indications may be performed as noted.  Right Carotid Findings: +----------+--------+--------+--------+------------------+------------------+           PSV cm/sEDV cm/sStenosisPlaque DescriptionComments           +----------+--------+--------+--------+------------------+------------------+ CCA Prox  132     12                                                   +----------+--------+--------+--------+------------------+------------------+ CCA Distal90      12                                intimal thickening +----------+--------+--------+--------+------------------+------------------+ ICA Prox                                            stent              +----------+--------+--------+--------+------------------+------------------+ ICA Mid                                             stent               +----------+--------+--------+--------+------------------+------------------+ ICA Distal151     30                                                   +----------+--------+--------+--------+------------------+------------------+ ECA       257     5                                                    +----------+--------+--------+--------+------------------+------------------+ +----------+--------+-------+--------+-------------------+  PSV cm/sEDV cmsDescribeArm Pressure (mmHG) +----------+--------+-------+--------+-------------------+ Subclavian109                                        +----------+--------+-------+--------+-------------------+ +---------+--------+--+--------+--+---------+ VertebralPSV cm/s94EDV cm/s16Antegrade +---------+--------+--+--------+--+---------+  Right Stent(s): +---------------+---+--+++------------------------+ Prox to Stent  74 15                         +---------------+---+--+++------------------------+ Proximal Stent 80 17                         +---------------+---+--+++------------------------+ Mid Stent      91 12acoustic shadowing noted +---------------+---+--+++------------------------+ Distal Stent   11514                         +---------------+---+--+++------------------------+ Distal to [id number]                         +---------------+---+--+++------------------------+   Left Carotid Findings: +----------+--------+--------+--------+------------------+--------+           PSV cm/sEDV cm/sStenosisPlaque DescriptionComments +----------+--------+--------+--------+------------------+--------+ CCA Prox  109     9                                          +----------+--------+--------+--------+------------------+--------+ CCA Distal87      10              heterogenous               +----------+--------+--------+--------+------------------+--------+ ICA Prox                                             stent    +----------+--------+--------+--------+------------------+--------+ ICA Mid                                             stent    +----------+--------+--------+--------+------------------+--------+ ICA Distal131     26                                         +----------+--------+--------+--------+------------------+--------+ ECA       279     4                                          +----------+--------+--------+--------+------------------+--------+ +----------+--------+--------+--------+-------------------+           PSV cm/sEDV cm/sDescribeArm Pressure (mmHG) +----------+--------+--------+--------+-------------------+ [id number]                                         +----------+--------+--------+--------+-------------------+ +---------+--------+--+--------+--+---------+ VertebralPSV cm/s71EDV cm/s13Antegrade +---------+--------+--+--------+--+---------+  Left Stent(s): +---------------+---+--+++---------+ Prox to Stent  67 8           +---------------+---+--+++---------+ Proximal Stent 12525narrowing +---------------+---+--+++---------+ Mid Stent      13118narrowing +---------------+---+--+++---------+ Distal Stent  60454          +---------------+---+--+++---------+ Distal to Stent99 17          +---------------+---+--+++---------+    Summary: Right Carotid: Velocities in the right ICA are consistent with a 40-59%                stenosis. Patent ICA stent with acoustic shadowing noted mid                stent. Velocities within stent have increased since prior study                done 12/09/2021. ECA velocities have increased from 152 to 257                with change in waveform since prior study. Left Carotid: Patent ICA stent with some narrowing noted proximal through mid               stent. Velocities within the stent have increased since prior               study done 12/09/21. ECA velocities have  increased from 108 to 279               with change in waveform since prior study. Vertebrals:  Bilateral vertebral arteries demonstrate antegrade flow. Subclavians: Normal flow hemodynamics were seen in bilateral subclavian              arteries. *See table(s) above for measurements and observations.  Electronically signed by Delia Heady MD on 10/30/2023 at 12:33:17 PM.    Final    ECHOCARDIOGRAM COMPLETE Result Date: 10/30/2023    ECHOCARDIOGRAM REPORT   Patient Name:   [name] Date of Exam: 10/30/2023 Medical Rec #:  [id number]       Height:       63.0 in Accession #:    [id number]      Weight:       199.6 lb Date of Birth:  September 30, 1950        BSA:          1.932 m Patient Age:    72 years        BP:           113/46 mmHg Patient Gender: F               HR:           91 bpm. Exam Location:  Inpatient Procedure: 2D Echo (Both Spectral and Color Flow Doppler were utilized during            procedure). Indications:    stroke  History:        Patient has prior history of Echocardiogram examinations, most                 recent 09/17/2021. Risk Factors:Sleep Apnea, Dyslipidemia,                 Diabetes and Hypertension.  Sonographer:    Delcie Roch RDCS Referring Phys: [id number] SAGAR H JINWALA IMPRESSIONS  1. Left ventricular ejection fraction, by estimation, is 60 to 65%. The left ventricle has normal function. The left ventricle has no regional wall motion abnormalities. Left ventricular diastolic parameters are consistent with Grade I diastolic dysfunction (impaired relaxation).  2. Right ventricular systolic function is normal. The right ventricular size is normal. There is normal pulmonary artery systolic pressure.  3. The mitral valve is abnormal. Trivial mitral valve regurgitation. No evidence of  mitral stenosis. Severe mitral annular calcification.  4. The aortic valve is tricuspid. There is moderate calcification of the aortic valve. There is moderate thickening of the aortic valve. Aortic valve  regurgitation is not visualized. Aortic valve sclerosis/calcification is present, without any evidence of aortic stenosis.  5. The inferior vena cava is normal in size with greater than 50% respiratory variability, suggesting right atrial pressure of 3 mmHg. FINDINGS  Left Ventricle: Left ventricular ejection fraction, by estimation, is 60 to 65%. The left ventricle has normal function. The left ventricle has no regional wall motion abnormalities. Strain was performed and the global longitudinal strain is indeterminate. The left ventricular internal cavity size was normal in size. There is no left ventricular hypertrophy. Left ventricular diastolic parameters are consistent with Grade I diastolic dysfunction (impaired relaxation). Right Ventricle: The right ventricular size is normal. No increase in right ventricular wall thickness. Right ventricular systolic function is normal. There is normal pulmonary artery systolic pressure. The tricuspid regurgitant velocity is 2.76 m/s, and  with an assumed right atrial pressure of 3 mmHg, the estimated right ventricular systolic pressure is 33.5 mmHg. Left Atrium: Left atrial size was normal in size. Right Atrium: Right atrial size was normal in size. Pericardium: There is no evidence of pericardial effusion. Mitral Valve: The mitral valve is abnormal. There is mild thickening of the mitral valve leaflet(s). There is mild calcification of the mitral valve leaflet(s). Severe mitral annular calcification. Trivial mitral valve regurgitation. No evidence of mitral valve stenosis. Tricuspid Valve: The tricuspid valve is normal in structure. Tricuspid valve regurgitation is not demonstrated. No evidence of tricuspid stenosis. Aortic Valve: The aortic valve is tricuspid. There is moderate calcification of the aortic valve. There is moderate thickening of the aortic valve. Aortic valve regurgitation is not visualized. Aortic valve sclerosis/calcification is present, without any   evidence of aortic stenosis. Pulmonic Valve: The pulmonic valve was normal in structure. Pulmonic valve regurgitation is trivial. No evidence of pulmonic stenosis. Aorta: The aortic root is normal in size and structure. Venous: The inferior vena cava is normal in size with greater than 50% respiratory variability, suggesting right atrial pressure of 3 mmHg. IAS/Shunts: No atrial level shunt detected by color flow Doppler. Additional Comments: 3D was performed not requiring image post processing on an independent workstation and was indeterminate.  LEFT VENTRICLE PLAX 2D LVIDd:         3.80 cm Diastology LVIDs:         2.00 cm LV e' medial:    6.53 cm/s LV PW:         0.90 cm LV E/e' medial:  15.8 LV IVS:        1.00 cm LV e' lateral:   8.38 cm/s                        LV E/e' lateral: 12.3  RIGHT VENTRICLE          IVC RV Basal diam:  2.30 cm  IVC diam: 1.90 cm TAPSE (M-mode): 1.8 cm LEFT ATRIUM             Index        RIGHT ATRIUM           Index LA diam:        3.00 cm 1.55 cm/m   RA Area:     10.50 cm LA Vol (A2C):   33.3 ml 17.24 ml/m  RA Volume:   21.20 ml  10.97  ml/m LA Vol (A4C):   43.1 ml 22.31 ml/m LA Biplane Vol: 39.7 ml 20.55 ml/m  AORTIC VALVE LVOT Vmax:   126.00 cm/s LVOT Vmean:  83.000 cm/s LVOT VTI:    0.212 m  AORTA Ao Root diam: 3.10 cm Ao Asc diam:  3.00 cm MITRAL VALVE                TRICUSPID VALVE MV Area (PHT): 3.19 cm     TR Peak grad:   30.5 mmHg MV Decel Time: 238 msec     TR Vmax:        276.00 cm/s MV E velocity: 103.00 cm/s MV A velocity: 168.00 cm/s  SHUNTS MV E/A ratio:  0.61         Systemic VTI: 0.21 m Charlton Haws MD Electronically signed by Charlton Haws MD Signature Date/Time: 10/30/2023/11:17:22 AM    Final    MR ANGIO HEAD WO CONTRAST Result Date: 10/29/2023 CLINICAL DATA:  Stroke, follow up EXAM: MRA NECK WITHOUT CONTRAST MRA HEAD WITHOUT CONTRAST TECHNIQUE: Angiographic images of the Circle of Willis were acquired using MRA technique without intravenous contrast.  COMPARISON:  MRA head/neck 09/17/2021. FINDINGS: MRA NECK FINDINGS Aortic arch: Great vessel origins are patent. Right carotid system: Right ICA appears mildly narrowed. Artifact in this area limits assessment. Left carotid system: Suspected flow limiting stenosis of the left proximal ICA. Artifact in this area limits assessment. Vertebral arteries: Right dominant. Both vertebral arteries are patent without significant stenosis. MRA HEAD FINDINGS Anterior circulation: Bilateral intracranial ICAs, MCAs, and ACAs are patent without proximal hemodynamically significant stenosis. No aneurysm identified. Posterior circulation: Bilateral intradural vertebral arteries, basilar artery and bilateral posterior cerebral arteries are patent without proximal hemodynamically significant stenosis. 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 Recommend CTA of the neck to confirm and further characterize. MRA head: No large vessel occlusion or proximal hemodynamically significant stenosis. Electronically Signed   By: Feliberto Harts M.D.   On: 10/29/2023 22:56   MR ANGIO NECK WO CONTRAST Result Date: 10/29/2023 CLINICAL DATA:  Stroke, follow up EXAM: MRA NECK WITHOUT CONTRAST MRA HEAD WITHOUT CONTRAST TECHNIQUE: Angiographic images of the Circle of Willis were acquired using MRA technique without intravenous contrast. COMPARISON:  MRA head/neck 09/17/2021. FINDINGS: MRA NECK FINDINGS Aortic arch: Great vessel origins are patent. Right carotid system: Right ICA appears mildly narrowed. Artifact in this area limits assessment. Left carotid system: Suspected flow limiting stenosis of the left proximal ICA. Artifact in this area limits assessment. Vertebral arteries: Right dominant. Both vertebral arteries are patent without significant stenosis. MRA HEAD FINDINGS Anterior circulation: Bilateral intracranial ICAs, MCAs, and ACAs are patent without proximal hemodynamically significant stenosis. No  aneurysm identified. Posterior circulation: Bilateral intradural vertebral arteries, basilar artery and bilateral posterior cerebral arteries are patent without proximal hemodynamically significant stenosis. 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 Recommend CTA of the neck to confirm and further characterize. MRA head: No large vessel occlusion or proximal hemodynamically significant stenosis. Electronically Signed   By: Feliberto Harts M.D.   On: 10/29/2023 22:56   MR BRAIN WO CONTRAST Result Date: 10/29/2023 CLINICAL DATA:  Provided history: Neuro deficit, acute, stroke suspected. EXAM: MRI HEAD WITHOUT CONTRAST TECHNIQUE: Multiplanar, multiecho pulse sequences of the brain and surrounding structures were obtained without intravenous contrast. COMPARISON:  Head CT 10/29/2023.  Brain MRI 09/17/2021. FINDINGS: Brain: No age-advanced or lobar predominant cerebral atrophy. 5 mm acute infarct within the dorsal pons on the right.  Chronic lacunar infarcts and chronic small ischemic changes elsewhere within the pons, new from the prior brain MRI of 09/17/2021. Chronic lacunar infarcts within the bilateral thalami. Few nonspecific punctate chronic microhemorrhages scattered within the supratentorial brain. No evidence of an intracranial mass. No extra-axial fluid collection. No midline shift. Vascular: Maintained flow voids within the proximal large arterial vessels. Skull and upper cervical spine: No focal worrisome marrow lesion. Incompletely assessed cervical spondylosis. Susceptibility artifact arising from ACDF hardware. Sinuses/Orbits: No mass or acute finding within the imaged orbits. Prior bilateral ocular lens replacement. Mild mucosal thickening within the right maxillary sinus. T1 hyperintense signal throughout much of the left sphenoid sinus which may reflect the presence of inspissated secretions, a mucous retention cyst or a polyp. IMPRESSION: 1. 5 mm acute infarct  within the dorsal pons on the right. 2. Chronic lacunar infarcts and chronic small ischemic changes elsewhere within the pons, new from the prior brain MRI of 09/17/2021. 3. Chronic lacunar infarcts within the bilateral thalami, also new from the prior MRI. 4. Few nonspecific punctate chronic microhemorrhages within the supratentorial brain. 5. Mild paranasal sinus disease as described. Electronically Signed   By: Jackey Loge D.O.   On: 10/29/2023 17:58   CT HEAD CODE STROKE WO CONTRAST Result Date: 10/29/2023 CLINICAL DATA:  Code stroke. Neuro deficit, acute, stroke suspected. Dizziness for the last week. Acute onset of left-sided face, upper and lower extremity numbness beginning at 8:30 a.m. today. EXAM: CT HEAD WITHOUT CONTRAST TECHNIQUE: Contiguous axial images were obtained from the base of the skull through the vertex without intravenous contrast. RADIATION DOSE REDUCTION: This exam was performed according to the departmental dose-optimization program which includes automated exposure control, adjustment of the mA and/or kV according to patient size and/or use of iterative reconstruction technique. COMPARISON:  CT head without contrast 09/18/2021. FINDINGS: Brain: No acute infarct, hemorrhage, or mass lesion is present. Mild white matter changes demonstrates some progression. Deep brain nuclei are within normal limits. The ventricles are of normal size. Insular ribbon is normal. No acute or focal cortical abnormality is present. No significant extraaxial fluid collection is present. Midline structures are within normal limits. The brainstem and cerebellum are within normal limits. Vascular: Atherosclerotic calcifications are present within the cavernous ICA bilaterally. No hyperdense vessel is present. Skull: Calvarium is intact. No focal lytic or blastic lesions are present. No significant extracranial soft tissue lesion is present. Sinuses/Orbits: Chronic left sphenoid sinus disease is present. The  paranasal sinuses and mastoid air cells are otherwise clear. Bilateral lens replacements are noted. Globes and orbits are otherwise unremarkable. ASPECTS [facility] Stroke Program Early CT Score) - Ganglionic level infarction (caudate, lentiform nuclei, internal capsule, insula, M1-M3 cortex): 7/7 - Supraganglionic infarction (M4-M6 cortex): 3/3 Total score (0-10 with 10 being normal): 10/10 pelvic IMPRESSION: 1. No acute intracranial abnormality or significant interval change. 2. Mild white matter changes demonstrate some progression. This likely reflects the sequela of chronic microvascular ischemia. 3. Aspects is 10/10. 4. Chronic left sphenoid sinus disease. These results were called by telephone at the time of interpretation on 10/29/2023 at 12:06 pm to provider MELANIE BELFI , who verbally acknowledged these results. The above was relayed via text pager to Dr. Wilford Corner on 10/29/2023 at 12:06 . Electronically Signed   By: Marin Roberts M.D.   On: 10/29/2023 12:08      Subjective:  No significant events overnight, she denies any complaints today, sister brought her insulin pump this morning Discharge Exam: Vitals:   11/01/23 2326 11/02/23  0848  BP: 134/61 (!) 152/62  Pulse: 91 95  Resp: 18   Temp: 98.2 F (36.8 C) 98.1 F (36.7 C)  SpO2: 98% 98%   Vitals:   11/01/23 1635 11/01/23 1950 11/01/23 2326 11/02/23 0848  BP: (!) 144/59 (!) 151/74 134/61 (!) 152/62  Pulse:  100 91 95  Resp: 19  18   Temp: 98.4 F (36.9 C) 98.4 F (36.9 C) 98.2 F (36.8 C) 98.1 F (36.7 C)  TempSrc: Oral Oral Oral Oral  SpO2: 96% 98% 98% 98%  Weight:        General: Pt is alert, awake, not in acute distress, right sided weakness present Cardiovascular: RRR, S1/S2 +, no rubs, no gallops Respiratory: CTA bilaterally, no wheezing, no rhonchi Abdominal: Soft, NT, ND, bowel sounds + Extremities: no edema, no cyanosis    The results of significant diagnostics from this hospitalization (including imaging,  microbiology, ancillary and laboratory) are listed below for reference.     Microbiology: No results found for this or any previous visit (from the past 240 hours).   Labs: BNP (last 3 results) No results for input(s): "BNP" in the last 8760 hours. Basic Metabolic Panel: Recent Labs  Lab 10/29/23 1155 10/30/23 0248 10/31/23 0622 11/01/23 0943  NA 141 135 137 138  K 3.3* 4.6 4.1 4.0  CL 112* 110 109 106  CO2 21* 19* 22 24  GLUCOSE 196* 390* 263* 296*  BUN 18 20 17 16   CREATININE 0.89 1.15* 1.06* 1.08*  CALCIUM 8.8* 8.2* 8.8* 9.2  MG  --   --  1.7  --   PHOS  --   --  3.4  --    Liver Function Tests: Recent Labs  Lab 10/29/23 1155 10/31/23 0622  AST 25 21  ALT 22 17  ALKPHOS 84 85  BILITOT 0.5 0.5  PROT 7.6 6.0*  ALBUMIN 3.2* 2.7*   No results for input(s): "LIPASE", "AMYLASE" in the last 168 hours. No results for input(s): "AMMONIA" in the last 168 hours. CBC: Recent Labs  Lab 10/29/23 1155 10/30/23 0248 10/31/23 0622  WBC 6.2 5.0 3.7*  NEUTROABS 4.8  --  2.1  HGB 10.8* 8.6* 9.7*  HCT 34.4* 27.1* 29.9*  MCV 93.5 96.4 93.4  PLT 166 147* 156   Cardiac Enzymes: No results for input(s): "CKTOTAL", "CKMB", "CKMBINDEX", "TROPONINI" in the last 168 hours. BNP: Invalid input(s): "POCBNP" CBG: Recent Labs  Lab 11/01/23 0802 11/01/23 1216 11/01/23 1634 11/01/23 2111 11/02/23 0846  GLUCAP 271* 269* 336* 308* 298*   D-Dimer No results for input(s): "DDIMER" in the last 72 hours. Hgb A1c No results for input(s): "HGBA1C" in the last 72 hours. Lipid Profile No results for input(s): "CHOL", "HDL", "LDLCALC", "TRIG", "CHOLHDL", "LDLDIRECT" in the last 72 hours. Thyroid function studies Recent Labs    11/01/23 0943  TSH 0.012*   Anemia work up No results for input(s): "VITAMINB12", "FOLATE", "FERRITIN", "TIBC", "IRON", "RETICCTPCT" in the last 72 hours. Urinalysis    Component Value Date/Time   COLORURINE YELLOW 10/15/2021 0915   APPEARANCEUR CLEAR  10/15/2021 0915   LABSPEC 1.015 10/15/2021 0915   PHURINE 5.0 10/15/2021 0915   GLUCOSEU NEGATIVE 10/15/2021 0915   HGBUR SMALL (A) 10/15/2021 0915   BILIRUBINUR NEGATIVE 10/15/2021 0915   KETONESUR NEGATIVE 10/15/2021 0915   PROTEINUR 30 (A) 10/15/2021 0915   UROBILINOGEN 0.2 06/25/2009 0920   NITRITE NEGATIVE 10/15/2021 0915   LEUKOCYTESUR TRACE (A) 10/15/2021 0915   Sepsis Labs Recent Labs  Lab 10/29/23  1155 10/30/23 0248 10/31/23 0622  WBC 6.2 5.0 3.7*   Microbiology No results found for this or any previous visit (from the past 240 hours).   Time coordinating discharge: Over 30 minutes  SIGNED:   Huey Bienenstock, MD  [facility] 11/02/2023, 11:04 AM Pager   If 7PM-7AM, please contact night-coverage [email or web address] Password [email or web address]

## 2023-11-02 NOTE — TOC Transition Note (Signed)
 Transition of Care [facility]) - Discharge Note   Patient Details  Name: CAMILLE DRAGAN MRN: [id number] Date of Birth: 13-Sep-1950  [facility] CM/SW Contact:  Mearl Latin, LCSW Phone Number: 11/02/2023, 10:29 AM   Clinical Narrative:    Patient discharging to CIR today. No further TOC needs identified.    Final next level of care: IP Rehab Facility Barriers to Discharge: Barriers Resolved   Patient Goals and CMS Choice Patient states their goals for this hospitalization and ongoing recovery are:: Rehab          Discharge Placement                       Discharge Plan and Services Additional resources added to the After Visit Summary for                                       Social Drivers of Health (SDOH) Interventions SDOH Screenings   Food Insecurity: No Food Insecurity (10/29/2023)  Housing: Low Risk  (10/29/2023)  Transportation Needs: No Transportation Needs (10/29/2023)  Utilities: Not At Risk (10/29/2023)  Depression (PHQ2-9): Low Risk  (08/11/2023)  Financial Resource Strain: Low Risk  (08/12/2022)  Social Connections: Moderately Isolated (10/29/2023)  Tobacco Use: Low Risk  (10/29/2023)  Health Literacy: Adequate Health Literacy (08/11/2023)     Readmission Risk Interventions    10/27/2021    4:38 PM 09/26/2021   11:44 AM  Readmission Risk Prevention Plan  Transportation Screening Complete Complete  PCP or Specialist Appt within 3-5 Days Complete   HRI or Home Care Consult Complete   Social Work Consult for Recovery Care Planning/Counseling Complete   Palliative Care Screening Not Applicable   Medication Review Oceanographer) Complete Complete  PCP or Specialist appointment within 3-5 days of discharge  Complete  HRI or Home Care Consult  Complete  SW Recovery Care/Counseling Consult  Complete  Palliative Care Screening  Not Applicable  Skilled Nursing Facility  Patient Refused

## 2023-11-02 NOTE — Progress Notes (Addendum)
 Report called to CIR, Museum/gallery conservator. Per request, IV left in.

## 2023-11-02 NOTE — Inpatient Diabetes Management (Addendum)
 Inpatient Diabetes Program Recommendations  AACE/ADA: New Consensus Statement on Inpatient Glycemic Control (2015)  Target Ranges:  Prepandial:   less than 140 mg/dL      Peak postprandial:   less than 180 mg/dL (1-2 hours)      Critically ill patients:  140 - 180 mg/dL   Lab Results  Component Value Date   GLUCAP 332 (H) 11/02/2023   HGBA1C 9.2 (H) 10/29/2023    Review of Glycemic Control  Latest Reference Range & Units 11/01/23 12:16 11/01/23 16:34 11/01/23 21:11 11/02/23 08:46 11/02/23 12:10  Glucose-Capillary 70 - 99 mg/dL 403 (H) 474 (H) 259 (H) 298 (H) 332 (H)  (H): Data is abnormally high Diabetes history: Type 2 DM Outpatient Diabetes medications:  Tresiba 15 units daily , Jardiance 25 mg qd, Bydurian 2 mg qweek, omnipod insulin pump (using Humalog insulin)- last visible settings for insulin pump were as follows from 10/23/21:   Basal insulin  0000-0400       1.0 units/hour 0401-1300       1.4 units/hour 1301-1800       1.65 units/hour 1801-0000       1.45 units/hour Total daily basal insulin: 33.55 units/24 hours Current orders for Inpatient glycemic control: insulin pump, Jardiance 25 mg QD Prednisone 7.5 mg every day  Inpatient Diabetes Program Recommendations:    Noted consult. DM coordinator saw patient today prior to transfer to rehab. Insulin pump applied; also received Lantus 40 units QD. Since newly applied with bolus, Will follow.   Thanks, Lujean Rave, MSN, RNC-OB Diabetes Coordinator [phone] (8a-5p)

## 2023-11-02 NOTE — Plan of Care (Signed)
 Pt discharging to CIR.  Problem: Education: Goal: Knowledge of General Education information will improve Description: Including pain rating scale, medication(s)/side effects and non-pharmacologic comfort measures Outcome: Adequate for Discharge   Problem: Health Behavior/Discharge Planning: Goal: Ability to manage health-related needs will improve Outcome: Adequate for Discharge   Problem: Clinical Measurements: Goal: Ability to maintain clinical measurements within normal limits will improve Outcome: Adequate for Discharge Goal: Will remain free from infection Outcome: Adequate for Discharge Goal: Diagnostic test results will improve Outcome: Adequate for Discharge Goal: Respiratory complications will improve Outcome: Adequate for Discharge Goal: Cardiovascular complication will be avoided Outcome: Adequate for Discharge   Problem: Activity: Goal: Risk for activity intolerance will decrease Outcome: Adequate for Discharge   Problem: Nutrition: Goal: Adequate nutrition will be maintained Outcome: Adequate for Discharge   Problem: Coping: Goal: Level of anxiety will decrease Outcome: Adequate for Discharge   Problem: Elimination: Goal: Will not experience complications related to bowel motility Outcome: Adequate for Discharge Goal: Will not experience complications related to urinary retention Outcome: Adequate for Discharge   Problem: Pain Managment: Goal: General experience of comfort will improve and/or be controlled Outcome: Adequate for Discharge   Problem: Safety: Goal: Ability to remain free from injury will improve Outcome: Adequate for Discharge   Problem: Skin Integrity: Goal: Risk for impaired skin integrity will decrease Outcome: Adequate for Discharge   Problem: Education: Goal: Ability to describe self-care measures that may prevent or decrease complications (Diabetes Survival Skills Education) will improve Outcome: Adequate for Discharge Goal:  Individualized Educational Video(s) Outcome: Adequate for Discharge   Problem: Coping: Goal: Ability to adjust to condition or change in health will improve Outcome: Adequate for Discharge   Problem: Fluid Volume: Goal: Ability to maintain a balanced intake and output will improve Outcome: Adequate for Discharge   Problem: Health Behavior/Discharge Planning: Goal: Ability to identify and utilize available resources and services will improve Outcome: Adequate for Discharge Goal: Ability to manage health-related needs will improve Outcome: Adequate for Discharge   Problem: Metabolic: Goal: Ability to maintain appropriate glucose levels will improve Outcome: Adequate for Discharge   Problem: Nutritional: Goal: Maintenance of adequate nutrition will improve Outcome: Adequate for Discharge Goal: Progress toward achieving an optimal weight will improve Outcome: Adequate for Discharge   Problem: Skin Integrity: Goal: Risk for impaired skin integrity will decrease Outcome: Adequate for Discharge   Problem: Tissue Perfusion: Goal: Adequacy of tissue perfusion will improve Outcome: Adequate for Discharge   Problem: Acute Rehab PT Goals(only PT should resolve) Goal: Pt Will Go Supine/Side To Sit Outcome: Adequate for Discharge Goal: Patient Will Transfer Sit To/From Stand Outcome: Adequate for Discharge Goal: Pt Will Perform Standing Balance Or Pre-Gait Outcome: Adequate for Discharge Goal: Pt Will Ambulate Outcome: Adequate for Discharge   Problem: Acute Rehab OT Goals (only OT should resolve) Goal: Pt. Will Perform Grooming Outcome: Adequate for Discharge Goal: Pt. Will Perform Lower Body Bathing Outcome: Adequate for Discharge Goal: Pt. Will Perform Upper Body Dressing Outcome: Adequate for Discharge Goal: Pt. Will Perform Lower Body Dressing Outcome: Adequate for Discharge Goal: Pt. Will Transfer To Toilet Outcome: Adequate for Discharge Goal: Pt/Caregiver Will  Perform Home Exercise Program Outcome: Adequate for Discharge

## 2023-11-02 NOTE — Progress Notes (Signed)
 Genice Rouge, MD  Physician Physical Medicine and Rehabilitation   PMR Pre-admission    Signed   Date of Service: 11/02/2023 10:25 AM  Related encounter: ED to Hosp-Admission (Current) from 10/29/2023 in [facility] 5W Medical Specialty PCU   Signed     Expand All Collapse All  Show:Clear all [x] Written[x] Templated[x] Copied  Added by: [x] Beckie Salts Tye [place], RN  [] Hover for details PMR Admission Coordinator Pre-Admission Assessment   Patient: [name] is an 73 y.o., female MRN: [id number] DOB: 06-30-1951 Height:   Weight: 90.5 kg                                                                                                                                               Insurance Information HMO: yes    PPO:      PCP:      IPA:      80/20:      OTHER:  PRIMARY: U[employer] Medicare      Policy#: [id number]      Subscriber: pt CM Name: Sharren Bridge      Phone#: [phone] option 3     Fax#: [phone] Pre-Cert#: [id number]  approved for 7 days 3/11 until 3/18    Employer:  Benefits:  Phone #: [phone]     Name:  Eff. Date: 08/25/23     Deduct: does not have one      Out of Pocket Max: $3,900 ($36.49 met)      Life Max: NA CIR: $295/day co-pay for days 1-5, 100% coverage for days 6+      SNF: 100% coverage for days 1-20, $203 co-pay/day for days 21-100 Outpatient: $20/visit co-pay     Co-Pay:  Home Health: 100% coverage      Co-Pay:  DME: 80% coverage     Co-Pay: 20% co-insurance Providers: in-network  SECONDARY: none   Financial Counselor:       Phone#:    The Data processing manager" for patients in Inpatient Rehabilitation Facilities with attached "Privacy Act Statement-Health Care Records" was provided and verbally reviewed with: Patient   Emergency Contact Information Contact Information       Name Relation Home Work Mobile    Cardwell Sister     [phone]         Other Contacts   None on File      Current  Medical History  Patient Admitting Diagnosis: CVA   History of Present Illness: Pt is a 73 year old female with medical hx significant for: HTN, hypothyroidism, breast CA, myasthenia gravis, chronic headaches, diabetes type II, GERD, prior left hemispheric TIA and left thalamic infarct. Also history of ocular myasthenia gravis followed by [facility] and on Rituxan as well as IVIG on a every 6 weeks scheduled. ON an insulin pump for her DM. Pt presented to ED at [facility] on 10/29/23  d/t right facial numbness and numbness on right arm and leg. Pt reported vertigo x1 week and blurry vision, double vision. Code stroke activated.     Pt not a candidate for IV thrombolysis. Neurology consulted. Pt transferred to [facility] on 10/29/23. MRI revealed right pontine stroke. Also noted left upper quadrant visual field cut. MRA suspicious for flow-limiting stenosis of left proximal ICA. US carotid right ICA are consistent with a 40 - 59 % stenosis. Left t carotid; patent ICA stent with some narrowing noted proximal through mid stent. VTE prophylaxis Lovenox. On ASA at home, now on dual antiplatelet therapy ASA and Plavix for 3 weeks and then Plavix alone.   Continue with rosuvastatin, ezetimibe and Repatha. Insulin pump to be restarted on 3/11. TSH level low, so synthroid increased. S/p thyroidectomy in her history due to thyroid cancer.    Complete NIHSS TOTAL: 4 Glasgow Coma Scale Score: 15   Patient's medical record from [facility]  has been reviewed by the rehabilitation admission coordinator and physician.   Past Medical History      Past Medical History:  Diagnosis Date   Arthritis      "all over my body" (03/13/2013)   Asthma     Asymptomatic carotid artery stenosis, bilateral 10/08/2018   Breast cancer (HCC)     GERD (gastroesophageal reflux disease)     H/O hiatal hernia     Headache      pt states she has had headaches for about 6 months "off and on"   Heart  murmur      pt had echocardiogram on 09/17/21   Hypercholesterolemia 10/08/2018   Hypertension     Hypothyroidism     Myasthenia gravis (HCC)      "in my eyes; diagnsosed > 7 yr ago" (03/13/2013)   Sleep apnea      on CPAP   Stroke (HCC) 2021   Thyroid carcinoma (HCC)     Type II diabetes mellitus (HCC)          Has the patient had major surgery during 100 days prior to admission? No   Family History  family history includes Asthma in her father and mother; Congestive Heart Failure in her father and mother; Coronary artery disease in her sister; Diabetes in her brother, father, mother, and sister; Lung cancer in her father; Stroke (age of onset: 19) in her sister.   Current Medications   Current Medications    Current Facility-Administered Medications:    acetaminophen (TYLENOL) tablet 650 mg, 650 mg, Oral, Q6H PRN, 650 mg at 11/02/23 0536 **OR** acetaminophen (TYLENOL) suppository 650 mg, 650 mg, Rectal, Q6H PRN, Austin Miles, Sagar H, MD   albuterol (PROVENTIL) (2.5 MG/3ML) 0.083% nebulizer solution 3 mL, 3 mL, Inhalation, Q6H PRN, Briscoe Burns, MD   aspirin EC tablet 81 mg, 81 mg, Oral, Daily, Briscoe Burns, MD, 81 mg at 11/02/23 4034   clopidogrel (PLAVIX) tablet 75 mg, 75 mg, Oral, Daily, Briscoe Burns, MD, 75 mg at 11/02/23 7425   cyanocobalamin (VITAMIN B12) tablet 1,000 mcg, 1,000 mcg, Oral, Daily, Briscoe Burns, MD, 1,000 mcg at 11/02/23 0913   docusate sodium (COLACE) capsule 100 mg, 100 mg, Oral, BID, Elgergawy, Leana Roe, MD, 100 mg at 11/02/23 9563   empagliflozin (JARDIANCE) tablet 25 mg, 25 mg, Oral, Daily, Elgergawy, Leana Roe, MD, 25 mg at 11/02/23 0912   enoxaparin (LOVENOX) injection 40 mg, 40 mg, Subcutaneous, Q24H, Briscoe Burns, MD, 40 mg at 11/01/23  2037   ezetimibe (ZETIA) tablet 10 mg, 10 mg, Oral, Daily, Austin Miles, Sagar H, MD, 10 mg at 11/02/23 0913   insulin aspart (novoLOG) injection 0-15 Units, 0-15 Units, Subcutaneous, TID WC, Sheikh, Omair [place],  DO, 8 Units at 11/02/23 0914   insulin aspart (novoLOG) injection 0-5 Units, 0-5 Units, Subcutaneous, QHS, Marguerita Merles Baxley, [place], 4 Units at 11/01/23 2142   insulin aspart (novoLOG) injection 6 Units, 6 Units, Subcutaneous, TID WC, Elgergawy, Leana Roe, MD   insulin glargine (LANTUS) injection 40 Units, 40 Units, Subcutaneous, Daily, Elgergawy, Leana Roe, MD, 40 Units at 11/02/23 0951   insulin pump, , Subcutaneous, TID WC, HS, 0200, Elgergawy, Leana Roe, MD   levothyroxine (SYNTHROID) tablet 100 mcg, 100 mcg, Oral, Q0600, Elgergawy, Leana Roe, MD, 100 mcg at 11/02/23 0531   montelukast (SINGULAIR) tablet 10 mg, 10 mg, Oral, BH-q7a, Elgergawy, Leana Roe, MD, 10 mg at 11/02/23 1610   mycophenolate (CELLCEPT) capsule 1,000 mg, 1,000 mg, Oral, BID, Briscoe Burns, MD, 1,000 mg at 11/02/23 0921   ondansetron (ZOFRAN) injection 4 mg, 4 mg, Intravenous, Q6H PRN, Marland Mcalpine, Omair Latif, DO, 4 mg at 11/01/23 1552   pantoprazole (PROTONIX) EC tablet 40 mg, 40 mg, Oral, Daily, Briscoe Burns, MD, 40 mg at 11/02/23 0913   predniSONE (DELTASONE) tablet 7.5 mg, 7.5 mg, Oral, q morning, Elgergawy, Leana Roe, MD, 7.5 mg at 11/02/23 0912   pregabalin (LYRICA) capsule 25 mg, 25 mg, Oral, BID, Briscoe Burns, MD, 25 mg at 11/02/23 0913   rosuvastatin (CRESTOR) tablet 40 mg, 40 mg, Oral, Daily, Briscoe Burns, MD, 40 mg at 11/02/23 9604   tamoxifen (NOLVADEX) tablet 20 mg, 20 mg, Oral, Daily, Elgergawy, Leana Roe, MD, 20 mg at 11/02/23 0914   Vitamin D (Ergocalciferol) (DRISDOL) 1.25 MG (50000 UNIT) capsule 50,000 Units, 50,000 Units, Oral, Q Mon, Elgergawy, Leana Roe, MD, 50,000 Units at 11/01/23 1023     Patients Current Diet:  Diet Order                  Diet - low sodium heart healthy             Diet Carb Modified Fluid consistency: Thin; Room service appropriate? Yes  Diet effective now                       Precautions / Restrictions Precautions Precautions: Fall Restrictions Weight Bearing  Restrictions Per Provider Order: No    Has the patient had 2 or more falls or a fall with injury in the past year?No   Prior Activity Level Limited Community (1-2x/wk): leaves house for doctors' appointments   Prior Functional Level Prior Function Prior Level of Function : Independent/Modified Independent Mobility Comments: walks with RW ADLs Comments: ind, cooks/cleans, manages meds   Self Care: Did the patient need help bathing, dressing, using the toilet or eating?  Independent   Indoor Mobility: Did the patient need assistance with walking from room to room (with or without device)? Independent   Stairs: Did the patient need assistance with internal or external stairs (with or without device)? Independent   Functional Cognition: Did the patient need help planning regular tasks such as shopping or remembering to take medications? Independent   Patient Information Are you of Hispanic, Latino/a,or Spanish origin?: A. No, not of Hispanic, Latino/a, or Spanish origin What is your race?: B. Black or African American Do you need or want an interpreter to communicate with a doctor or  health care staff?: 0. No   Patient's Response To:  Health Literacy and Transportation Is the patient able to respond to health literacy and transportation needs?: Yes Health Literacy - How often do you need to have someone help you when you read instructions, pamphlets, or other written material from your doctor or pharmacy?: Never In the past 12 months, has lack of transportation kept you from medical appointments or from getting medications?: No In the past 12 months, has lack of transportation kept you from meetings, work, or from getting things needed for daily living?: No   Home Assistive Devices / Equipment Home Equipment: Agricultural consultant (2 wheels), Tub bench, Grab bars - tub/shower   Prior Device Use: Indicate devices/aids used by the patient prior to current illness, exacerbation or injury? None  of the above   Current Functional Level Cognition   Orientation Level: Oriented X4    Extremity Assessment (includes Sensation/Coordination)   Upper Extremity Assessment: RUE deficits/detail, Right hand dominant (LUE WFL) RUE Deficits / Details: Generally weak with weak/loose grasp, R shoulder AROM of 90 degrees and maintain further ROM when passively positioned. Impaired 2 point discrimination as well throughout forearm but intact in hand. RUE overall 3/5 moves against gravity but not tolerable to resistance RUE: Shoulder pain with ROM RUE Sensation: decreased light touch, decreased proprioception RUE Coordination: decreased fine motor, decreased gross motor  Lower Extremity Assessment: RLE deficits/detail RLE Deficits / Details: AAROM WFL, strength hip flexion 2-/5, knee extension 3+/5, ankle DF 2/5 RLE Sensation: decreased light touch RLE Coordination: decreased gross motor     ADLs   Overall ADL's : Needs assistance/impaired Eating/Feeding: Sitting, Minimal assistance Eating/Feeding Details (indicate cue type and reason): able to use LUE to feed, gave pt red tubing for built up handles when needed for RUE Grooming: Wash/dry face, Standing, Contact guard assist Grooming Details (indicate cue type and reason): incorporating bilat intergation Upper Body Bathing: Sitting, Set up Lower Body Bathing: Sitting/lateral leans, Minimal assistance Upper Body Dressing : Sitting, Moderate assistance Upper Body Dressing Details (indicate cue type and reason): assists with RUE Lower Body Dressing: Moderate assistance, Sitting/lateral leans Toilet Transfer: Minimal assistance, Rolling walker (2 wheels), Ambulation Toileting- Clothing Manipulation and Hygiene: Sit to/from stand, Minimal assistance Functional mobility during ADLs: Minimal assistance, Rolling walker (2 wheels)     Mobility   Overal bed mobility: Needs Assistance Bed Mobility: Supine to Sit, Sit to Supine Supine to sit: Min  assist Sit to supine: Min assist General bed mobility comments: Min A to assist BLEs to EOB and return to bed     Transfers   Overall transfer level: Needs assistance Equipment used: Rolling walker (2 wheels) Transfers: Sit to/from Stand Sit to Stand: Min assist General transfer comment: up from tall stretcher in ED     Ambulation / Gait / Stairs / Wheelchair Mobility   Ambulation/Gait Ambulation/Gait assistance: Min assist, Mod assist Gait Distance (Feet): 20 Feet Assistive device: Rolling walker (2 wheels) Gait Pattern/deviations: Step-to pattern, Decreased step length - right, Decreased stride length, Drifts right/left, Decreased dorsiflexion - right General Gait Details: assist to progress the R LE, assist for balance and walker management on turns     Posture / Balance Balance Overall balance assessment: Needs assistance Sitting balance-Leahy Scale: Good Standing balance support: Bilateral upper extremity supported Standing balance-Leahy Scale: Poor Standing balance comment: UE support and CGA for balance in static standing     Special needs/care consideration Insulin Pump Hgb A1c 9.2 CPAP at HS  Previous Home Environment  Living Arrangements: Alone Available Help at Discharge: Family, Available 24 hours/day Type of Home: House Home Layout: One level Home Access: Stairs to enter Entrance Stairs-Rails: None Entrance Stairs-Number of Steps: 2 Bathroom Shower/Tub: Associate Professor: Yes How Accessible: Accessible via walker Home Care Services: No   Discharge Living Setting Plans for Discharge Living Setting: Patient's home Type of Home at Discharge: House Discharge Home Layout: One level Discharge Home Access: Stairs to enter Entrance Stairs-Rails: None Entrance Stairs-Number of Steps: 2 Discharge Bathroom Shower/Tub: Tub/shower unit Discharge Bathroom Toilet: Standard Discharge Bathroom Accessibility: Yes How  Accessible: Accessible via walker Does the patient have any problems obtaining your medications?: Yes (Describe) (expensive)   Social/Family/Support Systems Anticipated Caregiver: [name] (sister) and other siblings Anticipated Caregiver's Contact Information: Darlene: [phone] Caregiver Availability: 24/7 Discharge Plan Discussed with Primary Caregiver: Yes Is Caregiver In Agreement with Plan?: Yes Does Caregiver/Family have Issues with Lodging/Transportation while Pt is in Rehab?: No   Goals Patient/Family Goal for Rehab: Supervison: PT/OT Expected length of stay: 7-10 days Pt/Family Agrees to Admission and willing to participate: Yes Program Orientation Provided & Reviewed with Pt/Caregiver Including Roles  & Responsibilities: Yes   Decrease burden of Care through IP rehab admission: n/a   Possible need for SNF placement upon discharge:not anticipated   Patient Condition: This patient's condition remains as documented in the consult dated 11/01/23, in which the Rehabilitation Physician determined and documented that the patient's condition is appropriate for intensive rehabilitative care in an inpatient rehabilitation facility. Will admit to inpatient rehab today.   Preadmission Screen Completed By: Wolfgang Phoenix with updates by  Clois Dupes, RN, 11/02/2023 10:38 AM ______________________________________________________________________   Discussed status with Dr. Berline Chough on 11/02/23 at 1036 and received approval for admission today.   Admission Coordinator:  Wolfgang Phoenix with updates by Clois Dupes, time 8295 Date 11/02/23            Revision History

## 2023-11-02 NOTE — Progress Notes (Signed)
 Patient activating use of insulin pump. MD orders in. Diabetes educator at bedside assisting with setup and having documents for use signed by pt.   Confirmed with educator, no other forms on my end need to be filled out. Confirmed starting at 1200 sliding scale dose today, pt will begin use of insulin pump.

## 2023-11-02 NOTE — Discharge Instructions (Signed)
 Follow with Primary MD Irena Reichmann, DO in 7 days   Get CBC, CMP,  checked  by Primary MD next visit.    Activity: As tolerated with Full fall precautions use walker/cane & assistance as needed   Disposition CIR   Diet: Heart Healthy/carb modified   On your next visit with your primary care physician please Get Medicines reviewed and adjusted.   Please request your Prim.MD to go over all Hospital Tests and Procedure/Radiological results at the follow up, please get all Hospital records sent to your Prim MD by signing hospital release before you go home.   If you experience worsening of your admission symptoms, develop shortness of breath, life threatening emergency, suicidal or homicidal thoughts you must seek medical attention immediately by calling 911 or calling your MD immediately  if symptoms less severe.  You Must read complete instructions/literature along with all the possible adverse reactions/side effects for all the Medicines you take and that have been prescribed to you. Take any new Medicines after you have completely understood and accpet all the possible adverse reactions/side effects.   Do not drive, operating heavy machinery, perform activities at heights, swimming or participation in water activities or provide baby sitting services if your were admitted for syncope or siezures until you have seen by Primary MD or a Neurologist and advised to do so again.  Do not drive when taking Pain medications.    Do not take more than prescribed Pain, Sleep and Anxiety Medications  Special Instructions: If you have smoked or chewed Tobacco  in the last 2 yrs please stop smoking, stop any regular Alcohol  and or any Recreational drug use.  Wear Seat belts while driving.   Please note  You were cared for by a hospitalist during your hospital stay. If you have any questions about your discharge medications or the care you received while you were in the hospital after you are  discharged, you can call the unit and asked to speak with the hospitalist on call if the hospitalist that took care of you is not available. Once you are discharged, your primary care physician will handle any further medical issues. Please note that NO REFILLS for any discharge medications will be authorized once you are discharged, as it is imperative that you return to your primary care physician (or establish a relationship with a primary care physician if you do not have one) for your aftercare needs so that they can reassess your need for medications and monitor your lab values.

## 2023-11-02 NOTE — Progress Notes (Signed)
 Inpatient Rehabilitation Admission Medication Review by a Pharmacist  A complete drug regimen review was completed for this patient to identify any potential clinically significant medication issues.  High Risk Drug Classes Is patient taking? Indication by Medication  Antipsychotic No   Anticoagulant Yes Lovenox - VTE ppx  Antibiotic No   Opioid No   Antiplatelet Yes Aspirin, clopidogrel x 3 weeks then clopidogrel alone - CVA  Hypoglycemics/insulin Yes Insulin, Jardiance - DM  Vasoactive Medication No   Chemotherapy Yes, Oral Chemotherapy Tamoxifen - breast cancer  Other Yes Zetia, rosuvastatin - HLD Levothyroxine - low thyroid Montelukast - COPD Pantoprazole - reflux Pregabalin - pain Vitamin D - supplement Methocarbamol prn spasms Ondansetron prn N/V Albuterol prn SOB  Mycophenolate, prednisone-ocular myasthenia gravis     Type of Medication Issue Identified Description of Issue Recommendation(s)  Drug Interaction(s) (clinically significant)     Duplicate Therapy     Allergy     No Medication Administration End Date     Incorrect Dose     Additional Drug Therapy Needed     Significant med changes from prior encounter (inform family/care partners about these prior to discharge). Olmesartan 20 mg daily  Resume if and when appropriate   Other       Clinically significant medication issues were identified that warrant physician communication and completion of prescribed/recommended actions by midnight of the next day:  No  Name of provider notified for urgent issues identified:   Provider Method of Notification:    Pharmacist comments: None  Time spent performing this drug regimen review (minutes):  20 minutes  Okey Regal, PharmD

## 2023-11-02 NOTE — Progress Notes (Signed)
 Erick Colace, MD  Physician Physical Medicine and Rehabilitation   Consult Note    Signed   Date of Service: 11/01/2023 11:39 AM  Related encounter: ED to Hosp-Admission (Current) from 10/29/2023 in [facility] PCU   Signed     Expand All Collapse All  Show:Clear all [x] Written[x] Templated[] Copied  Added by: [x] Kirsteins, Victorino Sparrow, MD  [] Hover for details          Physical Medicine and Rehabilitation Consult Reason for Consult:CVA Referring Physician: Elgergawy     HPI: [name] is a 73 y.o. 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  Patient has history of prior C3-C5 fusion as well as left ICA stent.  She has a history of ocular myasthenia gravis but has been followed by [facility] neurology and on Rituxan as well as IVIG on a every 6 weeks schedule.  She has an insulin pump for her diabetes however her hemoglobin A1c was elevated at 9.2 on admission. Also prior history of breast carcinoma with paraneoplastic retinopathy She has started working with physical therapy and day after admission was at a min assist level with rolling walker.  At baseline she is modified independent with OT on day after admission she was at a min to mod assist level with ADLs and is normally modified independent   Review of Systems  Constitutional:  Negative for chills and fever.  HENT:  Negative for ear discharge, ear pain and nosebleeds.   Eyes:  Negative for pain, discharge and redness.  Respiratory:  Positive for cough. Negative for hemoptysis, wheezing and stridor.   Cardiovascular:  Negative for chest pain and leg swelling.  Gastrointestinal:  Negative for abdominal pain, nausea and vomiting.  Genitourinary:  Negative for flank pain.  Musculoskeletal:  Negative for falls and joint pain.  Skin:  Negative for itching.  Neurological:  Positive for sensory change and focal weakness.  Endo/Heme/Allergies:  Does not bruise/bleed  easily.  Psychiatric/Behavioral:  Negative for depression.         Past Medical History:  Diagnosis Date   Arthritis      "all over my body" (03/13/2013)   Asthma     Asymptomatic carotid artery stenosis, bilateral 10/08/2018   Breast cancer (HCC)     GERD (gastroesophageal reflux disease)     H/O hiatal hernia     Headache      pt states she has had headaches for about 6 months "off and on"   Heart murmur      pt had echocardiogram on 09/17/21   Hypercholesterolemia 10/08/2018   Hypertension     Hypothyroidism     Myasthenia gravis (HCC)      "in my eyes; diagnsosed > 7 yr ago" (03/13/2013)   Sleep apnea      on CPAP   Stroke (HCC) 2021   Thyroid carcinoma (HCC)     Type II diabetes mellitus (HCC)               Past Surgical History:  Procedure Laterality Date   ABDOMINAL HYSTERECTOMY       ANTERIOR CERVICAL DECOMP/DISCECTOMY FUSION        "I've had severa ORs; always went in from the front" (03/13/2013)   APPENDECTOMY       BREAST LUMPECTOMY WITH RADIOACTIVE SEED AND SENTINEL LYMPH NODE BIOPSY Left 09/02/2022    Procedure: LEFT BREAST LUMPECTOMY WITH RADIOACTIVE SEED AND SENTINEL LYMPH NODE BIOPSY;  Surgeon: Magnus Ivan,  Riley Lam, MD;  Location: [facility] OR;  Service: General;  Laterality: Left;   CARDIAC CATHETERIZATION        "several" (03/13/2013)   CARPAL TUNNEL RELEASE Right     CATARACT EXTRACTION W/ INTRAOCULAR LENS  IMPLANT, BILATERAL Bilateral     CHOLECYSTECTOMY       KNEE ARTHROSCOPY Left     SHOULDER ARTHROSCOPY W/ ROTATOR CUFF REPAIR Left     TONSILLECTOMY       TOTAL THYROIDECTOMY       TRANSCAROTID ARTERY REVASCULARIZATION  Left 09/24/2021    Procedure: LEFT TRANSCAROTID ARTERY REVASCULARIZATION;  Surgeon: Leonie Douglas, MD;  Location: [facility] OR;  Service: Vascular;  Laterality: Left;   TRANSCAROTID ARTERY REVASCULARIZATION  Right 10/23/2021    Procedure: Right Transcarotid Artery Revascularization;  Surgeon: Leonie Douglas, MD;  Location: [facility] OR;  Service:  Vascular;  Laterality: Right;   ULTRASOUND GUIDANCE FOR VASCULAR ACCESS Right 09/24/2021    Procedure: ULTRASOUND GUIDANCE FOR VASCULAR ACCESS;  Surgeon: Leonie Douglas, MD;  Location: [facility] OR;  Service: Vascular;  Laterality: Right;   ULTRASOUND GUIDANCE FOR VASCULAR ACCESS Right 10/23/2021    Procedure: ULTRASOUND GUIDANCE FOR VASCULAR ACCESS, LEFT FEMORAL VEIN;  Surgeon: Leonie Douglas, MD;  Location: [facility] OR;  Service: Vascular;  Laterality: Right;             Family History  Problem Relation Age of Onset   Coronary artery disease Sister          s/p coronary stenting   Stroke Sister 79   Diabetes Sister     Congestive Heart Failure Mother     Asthma Mother     Diabetes Mother     Congestive Heart Failure Father     Lung cancer Father     Asthma Father     Diabetes Father     Diabetes Brother          Social History:  reports that she has never smoked. She has never used smokeless tobacco. She reports that she does not drink alcohol and does not use drugs. Allergies:  Allergies       Allergies  Allergen Reactions   Contrast Media [Iodinated Contrast Media] Anaphylaxis      01/10/15 and 09/18/2021 --PT GIVEN 13 HR PRE MEDS FOR CT with contrast TOLERATED IV CONTRAST W/O ANY REACTION   Fluorescein Shortness Of Breath and Other (See Comments)      (Dye)   Iodine Anaphylaxis      Contrast dye - iodine   Magnesium-Containing Compounds Other (See Comments)      H/O myasthenia gravis- caution replacing IV    Molds & Smuts Shortness Of Breath and Other (See Comments)      Congestion and wheezing, also   Shellfish-Derived Products Anaphylaxis, Shortness Of Breath, Swelling and Other (See Comments)      Welts, also   Pholcodine Hives   Azithromycin Nausea Only   Codeine Hives and Nausea Only   Metformin Hcl Er Nausea Only   Oxycodone Nausea Only      Nausea 08/2022 admission.   Tramadol Hcl Nausea Only   Other Rash and Other (See Comments)      Coban causes welts, also             Medications Prior to Admission  Medication Sig Dispense Refill   acetaminophen (TYLENOL) 500 MG tablet Take 500 mg by mouth every 6 (six) hours as needed for moderate pain.  aspirin EC 81 MG EC tablet Take 1 tablet (81 mg total) by mouth daily.       Biotin 5000 MCG CAPS Take 5,000 mcg by mouth daily with breakfast.        cycloSPORINE (RESTASIS) 0.05 % ophthalmic emulsion Place 1 drop into both eyes 2 (two) times daily. 0.4 mL 0   diclofenac Sodium (VOLTAREN) 1 % GEL Apply 2 g topically 4 (four) times daily. (Patient taking differently: Apply 1 application  topically daily as needed (pain).) 2 g 0   diphenhydrAMINE 25 mg in sodium chloride 0.9 % 50 mL Inject 25 mg into the vein See admin instructions. Administer 25 mg intravenously at start of Gamunex infusion, then administer 25 mg during infusion       diphenoxylate-atropine (LOMOTIL) 2.5-0.025 MG tablet Take 1-2 tablets by mouth 4 (four) times daily as needed for diarrhea or loose stools. 30 tablet 2   ELDERBERRY PO Take 1 tablet by mouth daily.       empagliflozin (JARDIANCE) 25 MG TABS tablet Take 25 mg by mouth daily.       esomeprazole (NEXIUM) 40 MG capsule Take 1 capsule (40 mg total) by mouth 2 (two) times daily. 60 capsule 0   Evolocumab with Infusor (REPATHA PUSHTRONEX SYSTEM) 420 MG/3.5ML SOCT Inject 420 mg into the skin every 30 (thirty) days.       Exenatide ER (BYDUREON BCISE) 2 MG/0.85ML AUIJ Inject 2 mg into the skin every Monday.       ezetimibe (ZETIA) 10 MG tablet TAKE 1 TABLET BY MOUTH EVERY DAY 90 tablet 1   gabapentin (NEURONTIN) 100 MG capsule Take 100-200 mg by mouth at bedtime.       Immune Globulin, Human, 40 GM/400ML SOLN Inject into the vein every 6 (six) weeks. Infusions are administered at home by home health nurse - last infusion 09/08/21 and 09/09/21; next infusion due 10/19/21 and 10/20/21       insulin degludec (TRESIBA FLEXTOUCH) 100 UNIT/ML FlexTouch Pen Inject 15 Units into the skin in the morning.        insulin lispro (HUMALOG) 100 UNIT/ML injection Inject into the skin See admin instructions. Inject subcutaneously 3 (three) times daily with meals With meals through the Omnipod Dash Pump every 72 hours as directed       Levothyroxine Sodium 200 MCG CAPS Take 200 mcg by mouth daily before breakfast.       lidocaine-prilocaine (EMLA) cream Apply to affected area once 30 g 3   loperamide (IMODIUM A-D) 2 MG tablet Take 2 mg by mouth 4 (four) times daily as needed for diarrhea or loose stools.       loratadine (CLARITIN) 10 MG tablet Take 1 tablet (10 mg total) by mouth at bedtime. 30 tablet 0   meclizine (ANTIVERT) 25 MG tablet Take 25 mg by mouth 3 (three) times daily as needed for dizziness or nausea (or migraine-related nausea).       montelukast (SINGULAIR) 10 MG tablet Take 1 tablet (10 mg total) by mouth every morning. 30 tablet 0   mycophenolate (CELLCEPT) 500 MG tablet Take 1,000 mg by mouth 2 (two) times daily. For myasthenia gravis       olmesartan (BENICAR) 20 MG tablet Take 1 tablet (20 mg total) by mouth daily. 30 tablet 0   ondansetron (ZOFRAN) 8 MG tablet TAKE 1 TABLET BY MOUTH EVERY 8  HOURS AS NEEDED FOR NAUSEA AND  VOMITING 60 tablet 1   predniSONE (DELTASONE) 2.5 MG tablet Take  7.5 mg by mouth every morning. For myasthenia gravis       pregabalin (LYRICA) 25 MG capsule Take 1 capsule (25 mg total) by mouth 2 (two) times daily. 180 capsule 0   PRESCRIPTION MEDICATION Pt uses a cpap nightly       PROAIR HFA 108 (90 BASE) MCG/ACT inhaler Inhale 2 puffs into the lungs every 6 (six) hours as needed for wheezing or shortness of breath.        prochlorperazine (COMPAZINE) 10 MG tablet Take 1 tablet (10 mg total) by mouth every 6 (six) hours as needed for nausea or vomiting. 30 tablet 1   riTUXimab (RITUXAN) 500 MG/50ML injection Inject into the vein every 6 (six) months.       rosuvastatin (CRESTOR) 40 MG tablet Take 1 tablet (40 mg total) by mouth daily. 90 tablet 3   tamoxifen  (NOLVADEX) 20 MG tablet Take 1 tablet (20 mg total) by mouth daily. 90 tablet 1   topiramate (TOPAMAX) 50 MG tablet TAKE 1 TABLET BY MOUTH AT  BEDTIME 100 tablet 0   traMADol (ULTRAM) 50 MG tablet Take 1 tablet (50 mg total) by mouth every 6 (six) hours as needed for moderate pain. 25 tablet 0   vitamin B-12 (CYANOCOBALAMIN) 1000 MCG tablet Take 1,000 mcg by mouth once a week.       Vitamin D, Ergocalciferol, (DRISDOL) 50000 UNITS CAPS capsule Take 50,000 Units by mouth every Monday.              Home: Home Living Family/patient expects to be discharged to:: Private residence Living Arrangements: Alone Available Help at Discharge: Family, Available 24 hours/day Type of Home: House Home Access: Stairs to enter [employer] of Steps: 2 Entrance Stairs-Rails: None Home Layout: One level Bathroom Shower/Tub: Engineer, manufacturing systems: Standard Bathroom Accessibility: Yes Home Equipment: Agricultural consultant (2 wheels), Tub bench, Grab bars - tub/shower  Functional History: Prior Function Prior Level of Function : Independent/Modified Independent Mobility Comments: walks with RW ADLs Comments: ind, cooks/cleans, manages meds Functional Status:  Mobility: Bed Mobility Overal bed mobility: Needs Assistance Bed Mobility: Supine to Sit, Sit to Supine Supine to sit: Min assist Sit to supine: Min assist General bed mobility comments: Min A to assist BLEs to EOB and return to bed Transfers Overall transfer level: Needs assistance Equipment used: Rolling walker (2 wheels) Transfers: Sit to/from Stand Sit to Stand: Min assist General transfer comment: up from tall stretcher in ED Ambulation/Gait Ambulation/Gait assistance: Min assist, Mod assist Gait Distance (Feet): 20 Feet Assistive device: Rolling walker (2 wheels) Gait Pattern/deviations: Step-to pattern, Decreased step length - right, Decreased stride length, Drifts right/left, Decreased dorsiflexion - right General  Gait Details: assist to progress the R LE, assist for balance and walker management on turns   ADL: ADL Overall ADL's : Needs assistance/impaired Eating/Feeding: Sitting, Minimal assistance Eating/Feeding Details (indicate cue type and reason): able to use LUE to feed, gave pt red tubing for built up handles when needed for RUE Grooming: Wash/dry face, Standing, Contact guard assist Grooming Details (indicate cue type and reason): incorporating bilat intergation Upper Body Bathing: Sitting, Set up Lower Body Bathing: Sitting/lateral leans, Minimal assistance Upper Body Dressing : Sitting, Moderate assistance Upper Body Dressing Details (indicate cue type and reason): assists with RUE Lower Body Dressing: Moderate assistance, Sitting/lateral leans Toilet Transfer: Minimal assistance, Rolling walker (2 wheels), Ambulation Toileting- Clothing Manipulation and Hygiene: Sit to/from stand, Minimal assistance Functional mobility during ADLs: Minimal assistance, Rolling walker (2 wheels)  Cognition: Cognition Orientation Level: Oriented X4 Cognition Arousal: Alert Behavior During Therapy: WFL for tasks assessed/performed   Blood pressure (!) 156/63, pulse 80, temperature 98.2 F (36.8 C), temperature source Oral, resp. rate 13, weight 90.5 kg, SpO2 96%. Physical Exam   General: No acute distress Mood and affect are appropriate Heart: Regular rate and rhythm no rubs murmurs or extra sounds Lungs: Clear to auscultation, breathing unlabored, no rales or wheezes Abdomen: Positive bowel sounds, soft nontender to palpation, nondistended Extremities: No clubbing, cyanosis, or edema Skin: No evidence of breakdown, no evidence of rash Neurologic: No evidence of ptosis, no evidence of facial droop there is reduced sensation to light touch in the right V1 V2 V3 region motor strength is 5/5 in left and 3 - right deltoid, bicep, tricep, grip, hip flexor, knee extensors, ankle dorsiflexor and plantar  flexor Sensory exam normal sensation to light touch left side reduced to light touch right side  cerebellar exam normal finger to nose to finger on the left side but reduced on the right side due to weakness  musculoskeletal: Full range of motion in all 4 extremities. No joint swelling  Lab Results Last 24 Hours       Results for orders placed or performed during the hospital encounter of 10/29/23 (from the past 24 hours)  Glucose, capillary     Status: Abnormal    Collection Time: 10/31/23 11:50 AM  Result Value Ref Range    Glucose-Capillary 248 (H) 70 - 99 mg/dL  Glucose, capillary     Status: Abnormal    Collection Time: 10/31/23  3:36 PM  Result Value Ref Range    Glucose-Capillary 341 (H) 70 - 99 mg/dL  Glucose, capillary     Status: Abnormal    Collection Time: 10/31/23  9:01 PM  Result Value Ref Range    Glucose-Capillary 337 (H) 70 - 99 mg/dL  Glucose, capillary     Status: Abnormal    Collection Time: 11/01/23  8:02 AM  Result Value Ref Range    Glucose-Capillary 271 (H) 70 - 99 mg/dL  TSH     Status: Abnormal    Collection Time: 11/01/23  9:43 AM  Result Value Ref Range    TSH 0.012 (L) 0.350 - 4.500 uIU/mL  Basic metabolic panel     Status: Abnormal    Collection Time: 11/01/23  9:43 AM  Result Value Ref Range    Sodium 138 135 - 145 mmol/L    Potassium 4.0 3.5 - 5.1 mmol/L    Chloride 106 98 - 111 mmol/L    CO2 24 22 - 32 mmol/L    Glucose, Bld 296 (H) 70 - 99 mg/dL    BUN 16 8 - 23 mg/dL    Creatinine, Ser 1.61 (H) 0.44 - 1.00 mg/dL    Calcium 9.2 8.9 - 09.6 mg/dL    GFR, Estimated 55 (L) >60 mL/min    Anion gap 8 5 - 15      Imaging Results (Last 48 hours)  No results found.     Assessment/Plan: Diagnosis: Right dorsal pontine infarct as well as prior left thalamic infarct Does the need for close, 24 hr/day medical supervision in concert with the patient's rehab needs make it unreasonable for this patient to be served in a less intensive setting?  Yes Co-Morbidities requiring supervision/potential complications:  -Uncontrolled diabetes, history of ocular myasthenia gravis requiring immune modulation Due to bladder management, bowel management, safety, skin/wound care, disease management, medication administration, pain management, and  patient education, does the patient require 24 hr/day rehab nursing? Yes Does the patient require coordinated care of a physician, rehab nurse, therapy disciplines of PT, OT to address physical and functional deficits in the context of the above medical diagnosis(es)? Yes Addressing deficits in the following areas: balance, endurance, locomotion, strength, transferring, bowel/bladder control, bathing, dressing, feeding, grooming, and toileting Can the patient actively participate in an intensive therapy program of at least 3 hrs of therapy per day at least 5 days per week? Yes The potential for patient to make measurable gains while on inpatient rehab is good Anticipated functional outcomes upon discharge from inpatient rehab are supervision  with PT, supervision with OT, n/a with SLP. Estimated rehab length of stay to reach the above functional goals is: 7 to 10 days Anticipated discharge destination:  Home with family assistance Overall Rehab/Functional Prognosis: good   POST ACUTE RECOMMENDATIONS: This patient's condition is appropriate for continued rehabilitative care in the following setting: CIR Patient has agreed to participate in recommended program. Yes Note that insurance prior authorization may be required for reimbursement for recommended care.   Comment: Need to confirm discharge plan with family the patient states that she has multiple siblings in the area Neuro exam most consistent with prior CVA in the left thalamus area but patient has declined in her level of functioning since the right pontine stroke     I have personally performed a face to face diagnostic evaluation of this patient.  Additionally, I have examined the patient's medical record including any pertinent labs and radiographic images.     Thanks,   Erick Colace, MD 11/01/2023          Routing History

## 2023-11-02 NOTE — H&P (Signed)
 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 Reported new onset of dizziness for approximately one week prior to presentation at ED. [facility]ist telemedicine consult obtained.  Initial workup revealed CT head with aspect score of 10.  Emergent vessel imaging not performed due to anaphylaxis to iodinated contrast.  Per Dr. Wilford Corner, her exam was not consistent with LVO.  He recommended transfer to [facility] for MRI.  She was evaluated by Dr. Amada Jupiter and her MRI was positive for 5 mm acute infarct within the dorsal pons on the right.  MRA with suspected flow-limiting stenosis in the left proximal ICA.Marland Kitchen  She was loaded with Plavix 300 mg and started on aspirin 81 mg.  She also underwent MRA of head and neck.  2D echo with ejection fraction of 60 to 65%, LDL 41, hemoglobin A1c 9.2%.  She was started on Lovenox for VTE prophylaxis.  She will continue on DAPT for 3 weeks followed by Plavix alone.  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  She is being treated with tamoxifen.  She has a history of ocular myasthenia gravis and a history of thyroid cancer status post radiation and total thyroidectomy.  She has an insulin pump which will be reinitiated on admission to CIR.  She is requiring grossly min assist for bed mobility, transfers and gait with rolling walker for support.  Assist needed to manage right lower extremity in bed and during gait due to right lower extremity weakness.  Mild right inattention.  Rating carb modified diet with thin liquids. The patient requires inpatient medicine and  rehabilitation evaluations and services for ongoing dysfunction secondary to right pontine infarct.   Needs to make sure getting her Tamoxifen   Review of Systems  Constitutional:  Positive for malaise/fatigue.  HENT:  Positive for hearing loss.   Eyes:        Needs to get new eyeglasses- vision stable though  Respiratory:  Positive for cough. Negative for sputum production and shortness of breath.   Cardiovascular:  Negative for chest pain and leg swelling.       Mild swelling of LUE- hasn't been keeping elevated on pillow  Gastrointestinal:  Positive for nausea. Negative for constipation.       LBM Sunday, but is normal for her- denies constipation Dizzy and nauseated when stands with therapy  Genitourinary: Negative.   Musculoskeletal:  Positive for myalgias.       RLE and RUE painful- throbbing pain  Skin: Negative.   Neurological:  Positive for sensory change, focal weakness and headaches.       HA's bad before newest stroke, but worse since admission- 5-6/10 daily- was on Topamax at home already- cannot tolerate AM topamax dose- too sedating  Endo/Heme/Allergies: Negative.   Psychiatric/Behavioral:  The patient has insomnia.   All other systems reviewed and are negative.  Past Medical History:  Diagnosis Date   Arthritis    "all over my body" (03/13/2013)   Asthma    Asymptomatic carotid artery stenosis, bilateral 10/08/2018   Breast cancer (HCC)    GERD (gastroesophageal reflux disease)    H/O hiatal hernia    Headache  pt states she has had headaches for about 6 months "off and on"   Heart murmur    pt had echocardiogram on 09/17/21   Hypercholesterolemia 10/08/2018   Hypertension    Hypothyroidism    Myasthenia gravis (HCC)    "in my eyes; diagnsosed > 7 yr ago" (03/13/2013)   Sleep apnea    on CPAP   Stroke (HCC) 2021   Thyroid carcinoma (HCC)    Type II diabetes mellitus (HCC)    Past Surgical History:  Procedure Laterality Date   ABDOMINAL HYSTERECTOMY      ANTERIOR CERVICAL DECOMP/DISCECTOMY FUSION     "I've had severa ORs; always went in from the front" (03/13/2013)   APPENDECTOMY     BREAST LUMPECTOMY WITH RADIOACTIVE SEED AND SENTINEL LYMPH NODE BIOPSY Left 09/02/2022   Procedure: LEFT BREAST LUMPECTOMY WITH RADIOACTIVE SEED AND SENTINEL LYMPH NODE BIOPSY;  Surgeon: Abigail Miyamoto, MD;  Location: [facility] OR;  Service: General;  Laterality: Left;   CARDIAC CATHETERIZATION     "several" (03/13/2013)   CARPAL TUNNEL RELEASE Right    CATARACT EXTRACTION W/ INTRAOCULAR LENS  IMPLANT, BILATERAL Bilateral    CHOLECYSTECTOMY     KNEE ARTHROSCOPY Left    SHOULDER ARTHROSCOPY W/ ROTATOR CUFF REPAIR Left    TONSILLECTOMY     TOTAL THYROIDECTOMY     TRANSCAROTID ARTERY REVASCULARIZATION  Left 09/24/2021   Procedure: LEFT TRANSCAROTID ARTERY REVASCULARIZATION;  Surgeon: Leonie Douglas, MD;  Location: [facility] OR;  Service: Vascular;  Laterality: Left;   TRANSCAROTID ARTERY REVASCULARIZATION  Right 10/23/2021   Procedure: Right Transcarotid Artery Revascularization;  Surgeon: Leonie Douglas, MD;  Location: [facility] OR;  Service: Vascular;  Laterality: Right;   ULTRASOUND GUIDANCE FOR VASCULAR ACCESS Right 09/24/2021   Procedure: ULTRASOUND GUIDANCE FOR VASCULAR ACCESS;  Surgeon: Leonie Douglas, MD;  Location: [facility] OR;  Service: Vascular;  Laterality: Right;   ULTRASOUND GUIDANCE FOR VASCULAR ACCESS Right 10/23/2021   Procedure: ULTRASOUND GUIDANCE FOR VASCULAR ACCESS, LEFT FEMORAL VEIN;  Surgeon: Leonie Douglas, MD;  Location: [facility] OR;  Service: Vascular;  Laterality: Right;   Family History  Problem Relation Age of Onset   Coronary artery disease Sister        s/p coronary stenting   Stroke Sister 69   Diabetes Sister    Congestive Heart Failure Mother    Asthma Mother    Diabetes Mother    Congestive Heart Failure Father    Lung cancer Father    Asthma Father    Diabetes Father    Diabetes Brother    Social History:  reports that she has never  smoked. She has never used smokeless tobacco. She reports that she does not drink alcohol and does not use drugs. Allergies:  Allergies  Allergen Reactions   Contrast Media [Iodinated Contrast Media] Anaphylaxis    01/10/15 and 09/18/2021 --PT GIVEN 13 HR PRE MEDS FOR CT with contrast TOLERATED IV CONTRAST W/O ANY REACTION   Fluorescein Shortness Of Breath and Other (See Comments)    (Dye)   Iodine Anaphylaxis    Contrast dye - iodine   Magnesium-Containing Compounds Other (See Comments)    H/O myasthenia gravis- caution replacing IV    Molds & Smuts Shortness Of Breath and Other (See Comments)    Congestion and wheezing, also   Shellfish-Derived Products Anaphylaxis, Shortness Of Breath, Swelling and Other (See Comments)    Welts, also   Pholcodine Hives   Azithromycin Nausea Only   Codeine Hives and  Nausea Only   Metformin Hcl Er Nausea Only   Oxycodone Nausea Only    Nausea 08/2022 admission.   Tramadol Hcl Nausea Only   Other Rash and Other (See Comments)    Coban causes welts, also   Medications Prior to Admission  Medication Sig Dispense Refill   acetaminophen (TYLENOL) 500 MG tablet Take 500 mg by mouth every 6 (six) hours as needed for moderate pain.     aspirin EC 81 MG EC tablet Take 1 tablet (81 mg total) by mouth daily.     Biotin 5000 MCG CAPS Take 5,000 mcg by mouth daily with breakfast.      cycloSPORINE (RESTASIS) 0.05 % ophthalmic emulsion Place 1 drop into both eyes 2 (two) times daily. 0.4 mL 0   diclofenac Sodium (VOLTAREN) 1 % GEL Apply 2 g topically 4 (four) times daily. (Patient taking differently: Apply 1 application  topically daily as needed (pain).) 2 g 0   diphenhydrAMINE 25 mg in sodium chloride 0.9 % 50 mL Inject 25 mg into the vein See admin instructions. Administer 25 mg intravenously at start of Gamunex infusion, then administer 25 mg during infusion     diphenoxylate-atropine (LOMOTIL) 2.5-0.025 MG tablet Take 1-2 tablets by mouth 4 (four) times daily  as needed for diarrhea or loose stools. 30 tablet 2   ELDERBERRY PO Take 1 tablet by mouth daily.     empagliflozin (JARDIANCE) 25 MG TABS tablet Take 25 mg by mouth daily.     esomeprazole (NEXIUM) 40 MG capsule Take 1 capsule (40 mg total) by mouth 2 (two) times daily. 60 capsule 0   Evolocumab with Infusor (REPATHA PUSHTRONEX SYSTEM) 420 MG/3.5ML SOCT Inject 420 mg into the skin every 30 (thirty) days.     Exenatide ER (BYDUREON BCISE) 2 MG/0.85ML AUIJ Inject 2 mg into the skin every Monday.     ezetimibe (ZETIA) 10 MG tablet TAKE 1 TABLET BY MOUTH EVERY DAY 90 tablet 1   gabapentin (NEURONTIN) 100 MG capsule Take 100-200 mg by mouth at bedtime.     Immune Globulin, Human, 40 GM/400ML SOLN Inject into the vein every 6 (six) weeks. Infusions are administered at home by home health nurse - last infusion 09/08/21 and 09/09/21; next infusion due 10/19/21 and 10/20/21     insulin degludec (TRESIBA FLEXTOUCH) 100 UNIT/ML FlexTouch Pen Inject 15 Units into the skin in the morning.     insulin lispro (HUMALOG) 100 UNIT/ML injection Inject into the skin See admin instructions. Inject subcutaneously 3 (three) times daily with meals With meals through the Omnipod Dash Pump every 72 hours as directed     Levothyroxine Sodium 200 MCG CAPS Take 200 mcg by mouth daily before breakfast.     lidocaine-prilocaine (EMLA) cream Apply to affected area once 30 g 3   loperamide (IMODIUM A-D) 2 MG tablet Take 2 mg by mouth 4 (four) times daily as needed for diarrhea or loose stools.     loratadine (CLARITIN) 10 MG tablet Take 1 tablet (10 mg total) by mouth at bedtime. 30 tablet 0   meclizine (ANTIVERT) 25 MG tablet Take 25 mg by mouth 3 (three) times daily as needed for dizziness or nausea (or migraine-related nausea).     montelukast (SINGULAIR) 10 MG tablet Take 1 tablet (10 mg total) by mouth every morning. 30 tablet 0   mycophenolate (CELLCEPT) 500 MG tablet Take 1,000 mg by mouth 2 (two) times daily. For myasthenia  gravis     olmesartan (BENICAR) 20  MG tablet Take 1 tablet (20 mg total) by mouth daily. 30 tablet 0   ondansetron (ZOFRAN) 8 MG tablet TAKE 1 TABLET BY MOUTH EVERY 8  HOURS AS NEEDED FOR NAUSEA AND  VOMITING 60 tablet 1   predniSONE (DELTASONE) 2.5 MG tablet Take 7.5 mg by mouth every morning. For myasthenia gravis     pregabalin (LYRICA) 25 MG capsule Take 1 capsule (25 mg total) by mouth 2 (two) times daily. 180 capsule 0   PRESCRIPTION MEDICATION Pt uses a cpap nightly     PROAIR HFA 108 (90 BASE) MCG/ACT inhaler Inhale 2 puffs into the lungs every 6 (six) hours as needed for wheezing or shortness of breath.      prochlorperazine (COMPAZINE) 10 MG tablet Take 1 tablet (10 mg total) by mouth every 6 (six) hours as needed for nausea or vomiting. 30 tablet 1   riTUXimab (RITUXAN) 500 MG/50ML injection Inject into the vein every 6 (six) months.     rosuvastatin (CRESTOR) 40 MG tablet Take 1 tablet (40 mg total) by mouth daily. 90 tablet 3   tamoxifen (NOLVADEX) 20 MG tablet Take 1 tablet (20 mg total) by mouth daily. 90 tablet 1   topiramate (TOPAMAX) 50 MG tablet TAKE 1 TABLET BY MOUTH AT  BEDTIME 100 tablet 0   traMADol (ULTRAM) 50 MG tablet Take 1 tablet (50 mg total) by mouth every 6 (six) hours as needed for moderate pain. 25 tablet 0   vitamin B-12 (CYANOCOBALAMIN) 1000 MCG tablet Take 1,000 mcg by mouth once a week.     Vitamin D, Ergocalciferol, (DRISDOL) 50000 UNITS CAPS capsule Take 50,000 Units by mouth every Monday.         Home: Home Living Family/patient expects to be discharged to:: Private residence Living Arrangements: Alone Available Help at Discharge: Family, Available 24 hours/day Type of Home: House Home Access: Stairs to enter [employer] of Steps: 2 Entrance Stairs-Rails: None Home Layout: One level Bathroom Shower/Tub: Engineer, manufacturing systems: Standard Bathroom Accessibility: Yes Home Equipment: Agricultural consultant (2 wheels), Tub bench, Grab bars  - tub/shower   Functional History: Prior Function Prior Level of Function : Independent/Modified Independent Mobility Comments: walks with RW ADLs Comments: ind, cooks/cleans, manages meds  Functional Status:  Mobility: Bed Mobility Overal bed mobility: Needs Assistance Bed Mobility: Supine to Sit, Sit to Supine Supine to sit: Min assist Sit to supine: Min assist General bed mobility comments: Min A to assist BLEs to EOB and return to bed Transfers Overall transfer level: Needs assistance Equipment used: Rolling walker (2 wheels) Transfers: Sit to/from Stand Sit to Stand: Min assist General transfer comment: up from tall stretcher in ED Ambulation/Gait Ambulation/Gait assistance: Min assist, Mod assist Gait Distance (Feet): 20 Feet Assistive device: Rolling walker (2 wheels) Gait Pattern/deviations: Step-to pattern, Decreased step length - right, Decreased stride length, Drifts right/left, Decreased dorsiflexion - right General Gait Details: assist to progress the R LE, assist for balance and walker management on turns    ADL: ADL Overall ADL's : Needs assistance/impaired Eating/Feeding: Sitting, Minimal assistance Eating/Feeding Details (indicate cue type and reason): able to use LUE to feed, gave pt red tubing for built up handles when needed for RUE Grooming: Wash/dry face, Standing, Contact guard assist Grooming Details (indicate cue type and reason): incorporating bilat intergation Upper Body Bathing: Sitting, Set up Lower Body Bathing: Sitting/lateral leans, Minimal assistance Upper Body Dressing : Sitting, Moderate assistance Upper Body Dressing Details (indicate cue type and reason): assists with RUE Lower  Body Dressing: Moderate assistance, Sitting/lateral leans Toilet Transfer: Minimal assistance, Rolling walker (2 wheels), Ambulation Toileting- Clothing Manipulation and Hygiene: Sit to/from stand, Minimal assistance Functional mobility during ADLs: Minimal  assistance, Rolling walker (2 wheels)  Cognition: Cognition Orientation Level: Oriented X4 Cognition Arousal: Alert Behavior During Therapy: WFL for tasks assessed/performed  Physical Exam: Blood pressure (!) 152/62, pulse 95, temperature 98.1 F (36.7 C), temperature source Oral, resp. rate 18, weight 90.5 kg, SpO2 98%. Physical Exam Vitals and nursing note reviewed. Exam conducted with a chaperone present.  Constitutional:      Appearance: Normal appearance. She is obese.     Comments: Sitting up in bedside chair- niece at bedside; just finished lunch 100%- awake, alert, appropriate, NAD  HENT:     Head: Normocephalic and atraumatic.     Comments: Decreased to light touch from nose downwards- R side    Nose: Nose normal. No congestion.     Mouth/Throat:     Mouth: Mucous membranes are dry.     Pharynx: Oropharynx is clear. No oropharyngeal exudate.  Eyes:     General:        Right eye: No discharge.        Left eye: No discharge.     Comments: Dysconjugate gaze L upper visual field cut- is chronic per pt No nystagmus  Cardiovascular:     Rate and Rhythm: Normal rate and regular rhythm.     Heart sounds: Normal heart sounds. No murmur heard.    No gallop.     Comments: Rate in 90's Pulmonary:     Effort: Pulmonary effort is normal. No respiratory distress.     Breath sounds: Normal breath sounds. No wheezing, rhonchi or rales.     Comments: Cough with deep breaths Abdominal:     General: Bowel sounds are normal. There is no distension.     Palpations: Abdomen is soft.     Tenderness: There is no abdominal tenderness.  Musculoskeletal:        General: Normal range of motion.     Cervical back: Neck supple. No tenderness.     Comments: LUE- biceps 2+/5; Triceps 2/5; WE 3-/5; Grip 2/5; FA 1/5 RUE- 5/5 except FA 4/5 LLE- HF 1/5; KE 2-/5 DF/PF 0/5;  RLE-5-/5  Skin:    General: Skin is warm and dry.     Comments: LUE- swelling- mild in hand- not elevated Port R chest-  not accessed  Neurological:     Mental Status: She is alert and oriented to person, place, and time.     Comments: Decreased on L side throughout   Psychiatric:        Mood and Affect: Mood normal.        Behavior: Behavior normal.     Results for orders placed or performed during the hospital encounter of 10/29/23 (from the past 48 hours)  Glucose, capillary     Status: Abnormal   Collection Time: 10/31/23 11:50 AM  Result Value Ref Range   Glucose-Capillary 248 (H) 70 - 99 mg/dL    Comment: Glucose reference range applies only to samples taken after fasting for at least 8 hours.  Glucose, capillary     Status: Abnormal   Collection Time: 10/31/23  3:36 PM  Result Value Ref Range   Glucose-Capillary 341 (H) 70 - 99 mg/dL    Comment: Glucose reference range applies only to samples taken after fasting for at least 8 hours.  Glucose, capillary     Status:  Abnormal   Collection Time: 10/31/23  9:01 PM  Result Value Ref Range   Glucose-Capillary 337 (H) 70 - 99 mg/dL    Comment: Glucose reference range applies only to samples taken after fasting for at least 8 hours.  Glucose, capillary     Status: Abnormal   Collection Time: 11/01/23  8:02 AM  Result Value Ref Range   Glucose-Capillary 271 (H) 70 - 99 mg/dL    Comment: Glucose reference range applies only to samples taken after fasting for at least 8 hours.  T4, free     Status: Abnormal   Collection Time: 11/01/23  9:42 AM  Result Value Ref Range   Free T4 2.22 (H) 0.61 - 1.12 ng/dL    Comment: (NOTE) Biotin ingestion may interfere with free T4 tests. If the results are inconsistent with the TSH level, previous test results, or the clinical presentation, then consider biotin interference. If needed, order repeat testing after stopping biotin. Performed at [facility], [address], [place], [place] [address]   TSH     Status: Abnormal   Collection Time: 11/01/23  9:43 AM  Result Value Ref Range   TSH 0.012 (L)  0.350 - 4.500 uIU/mL    Comment: Performed by a 3rd Generation assay with a functional sensitivity of <=0.01 uIU/mL. Performed at [facility], [address], [place], [place] [address]   Basic metabolic panel     Status: Abnormal   Collection Time: 11/01/23  9:43 AM  Result Value Ref Range   Sodium 138 135 - 145 mmol/L   Potassium 4.0 3.5 - 5.1 mmol/L   Chloride 106 98 - 111 mmol/L   CO2 24 22 - 32 mmol/L   Glucose, Bld 296 (H) 70 - 99 mg/dL    Comment: Glucose reference range applies only to samples taken after fasting for at least 8 hours.   BUN 16 8 - 23 mg/dL   Creatinine, Ser 0.98 (H) 0.44 - 1.00 mg/dL   Calcium 9.2 8.9 - 11.9 mg/dL   GFR, Estimated 55 (L) >60 mL/min    Comment: (NOTE) Calculated using the CKD-EPI Creatinine Equation (2021)    Anion gap 8 5 - 15    Comment: Performed at [facility], [address] [place], [place] [address]  Glucose, capillary     Status: Abnormal   Collection Time: 11/01/23 12:16 PM  Result Value Ref Range   Glucose-Capillary 269 (H) 70 - 99 mg/dL    Comment: Glucose reference range applies only to samples taken after fasting for at least 8 hours.  Glucose, capillary     Status: Abnormal   Collection Time: 11/01/23  4:34 PM  Result Value Ref Range   Glucose-Capillary 336 (H) 70 - 99 mg/dL    Comment: Glucose reference range applies only to samples taken after fasting for at least 8 hours.  Glucose, capillary     Status: Abnormal   Collection Time: 11/01/23  9:11 PM  Result Value Ref Range   Glucose-Capillary 308 (H) 70 - 99 mg/dL    Comment: Glucose reference range applies only to samples taken after fasting for at least 8 hours.  Glucose, capillary     Status: Abnormal   Collection Time: 11/02/23  8:46 AM  Result Value Ref Range   Glucose-Capillary 298 (H) 70 - 99 mg/dL    Comment: Glucose reference range applies only to samples taken after fasting for at least 8 hours.   No results  found.    Blood pressure (!)  152/62, pulse 95, temperature 98.1 F (36.7 C), temperature source Oral, resp. rate 18, weight 90.5 kg, SpO2 98%.  Medical Problem List and Plan: 1. Functional deficits secondary to R pontine CVA  -patient may  shower= as long as port not accessed  -ELOS/Goals: 7-10 days min A to supervision  Admit to CIR 2.  Antithrombotics: -DVT/anticoagulation:  Pharmaceutical: Lovenox  -antiplatelet therapy: Aspirin and Plavix for three weeks followed by Plavix alone (has ICA stent)  3. Pain Management: Tylenol as needed  -Lyrica 75 mg BID  4. Mood/Behavior/Sleep: LCSW to evaluate and provide emotional support  -antipsychotic agents: n/a  5. Neuropsych/cognition: This patient is capable of making decisions on her own behalf.  6. Skin/Wound Care: Routine skin care checks   7. 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 Dizzy/nauseated with standing- not clear if orthostatic hypotension- needs further work up/to be addressed   Milinda Antis, PA-C 11/02/2023   I have personally performed a  face to face diagnostic evaluation of this patient and formulated the key components of the plan.  Additionally, I have personally reviewed laboratory data, imaging studies, as well as relevant notes and concur with the physician assistant's documentation above.   The patient's status has not changed from the original H&P.  Any changes in documentation from the acute care chart have been noted above.

## 2023-11-02 NOTE — PMR Pre-admission (Signed)
 PMR Admission Coordinator Pre-Admission Assessment  Patient: [name] is an 73 y.o., female MRN: [id number] DOB: 12-05-1950 Height:   Weight: 90.5 kg             Insurance Information HMO: yes    PPO:      PCP:      IPA:      80/20:      OTHER:  PRIMARY: [employer] Medicare      Policy#: [id number]      Subscriber: pt CM Name: Sharren Bridge      Phone#: [phone] option 3     Fax#: [phone] Pre-Cert#: [id number]  approved for 7 days 3/11 until 3/18    Employer:  Benefits:  Phone #: [phone]     Name:  Eff. Date: 08/25/23     Deduct: does not have one      Out of Pocket Max: $3,900 ($36.49 met)      Life Max: NA CIR: $295/day co-pay for days 1-5, 100% coverage for days 6+      SNF: 100% coverage for days 1-20, $203 co-pay/day for days 21-100 Outpatient: $20/visit co-pay     Co-Pay:  Home Health: 100% coverage      Co-Pay:  DME: 80% coverage     Co-Pay: 20% co-insurance Providers: in-network  SECONDARY: none  Financial Counselor:       Phone#:   The Data processing manager" for patients in Inpatient Rehabilitation Facilities with attached "Privacy Act Statement-Health Care Records" was provided and verbally reviewed with: Patient  Emergency Contact Information Contact Information     Name Relation Home Work Mobile   [place] Sister   [phone]      Other Contacts   None on File    Current Medical History  Patient Admitting Diagnosis: CVA  History of Present Illness: Pt is a 73 year old female with medical hx significant for: HTN, hypothyroidism, breast CA, myasthenia gravis, chronic headaches, diabetes type II, GERD, prior left hemispheric TIA and left thalamic infarct. Also history of ocular myasthenia gravis followed by [facility] and on Rituxan as well as IVIG on a every 6 weeks scheduled. ON an insulin pump for her DM. Pt [facility] ED [facility] on 10/29/23 d/t right facial numbness and numbness on right arm  and leg. Pt reported vertigo x1 week and blurry vision, double vision. Code stroke activated.    Pt not a candidate for IV thrombolysis. Neurology consulted. Pt transferred to [facility] on 10/29/23. MRI revealed right pontine stroke. Also noted left upper quadrant visual field cut. MRA suspicious for flow-limiting stenosis of left proximal ICA. US carotid right ICA are consistent with a 40 - 59 % stenosis. Left t carotid; patent ICA stent with some narrowing noted proximal through mid stent. VTE prophylaxis Lovenox. On ASA at home, now on dual antiplatelet therapy ASA and Plavix for 3 weeks and then Plavix alone.   Continue with rosuvastatin, ezetimibe and Repatha. Insulin pump to be restarted on 3/11. TSH level low, so synthroid increased. S/p thyroidectomy in her history due to thyroid cancer.   Complete NIHSS TOTAL: 4 Glasgow Coma Scale Score: 15  Patient's medical record from [facility]  has been reviewed by the rehabilitation admission coordinator and physician.  Past Medical History  Past Medical History:  Diagnosis Date   Arthritis    "all over my body" (03/13/2013)   Asthma    Asymptomatic carotid artery stenosis, bilateral 10/08/2018   Breast cancer (HCC)  GERD (gastroesophageal reflux disease)    H/O hiatal hernia    Headache    pt states she has had headaches for about 6 months "off and on"   Heart murmur    pt had echocardiogram on 09/17/21   Hypercholesterolemia 10/08/2018   Hypertension    Hypothyroidism    Myasthenia gravis (HCC)    "in my eyes; diagnsosed > 7 yr ago" (03/13/2013)   Sleep apnea    on CPAP   Stroke (HCC) 2021   Thyroid carcinoma (HCC)    Type II diabetes mellitus (HCC)    Has the patient had major surgery during 100 days prior to admission? No  Family History  family history includes Asthma in her father and mother; Congestive Heart Failure in her father and mother; Coronary artery disease in her sister; Diabetes in her brother,  father, mother, and sister; Lung cancer in her father; Stroke (age of onset: 72) in her sister.  Current Medications   Current Facility-Administered Medications:    acetaminophen (TYLENOL) tablet 650 mg, 650 mg, Oral, Q6H PRN, 650 mg at 11/02/23 0536 **OR** acetaminophen (TYLENOL) suppository 650 mg, 650 mg, Rectal, Q6H PRN, Austin Miles, Sagar H, MD   albuterol (PROVENTIL) (2.5 MG/3ML) 0.083% nebulizer solution 3 mL, 3 mL, Inhalation, Q6H PRN, Briscoe Burns, MD   aspirin EC tablet 81 mg, 81 mg, Oral, Daily, Briscoe Burns, MD, 81 mg at 11/02/23 1610   clopidogrel (PLAVIX) tablet 75 mg, 75 mg, Oral, Daily, Briscoe Burns, MD, 75 mg at 11/02/23 9604   cyanocobalamin (VITAMIN B12) tablet 1,000 mcg, 1,000 mcg, Oral, Daily, Briscoe Burns, MD, 1,000 mcg at 11/02/23 0913   docusate sodium (COLACE) capsule 100 mg, 100 mg, Oral, BID, Elgergawy, Leana Roe, MD, 100 mg at 11/02/23 5409   empagliflozin (JARDIANCE) tablet 25 mg, 25 mg, Oral, Daily, Elgergawy, Leana Roe, MD, 25 mg at 11/02/23 0912   enoxaparin (LOVENOX) injection 40 mg, 40 mg, Subcutaneous, Q24H, Jinwala, Sagar H, MD, 40 mg at 11/01/23 2037   ezetimibe (ZETIA) tablet 10 mg, 10 mg, Oral, Daily, Briscoe Burns, MD, 10 mg at 11/02/23 0913   insulin aspart (novoLOG) injection 0-15 Units, 0-15 Units, Subcutaneous, TID WC, Sheikh, Omair [place], DO, 8 Units at 11/02/23 0914   insulin aspart (novoLOG) injection 0-5 Units, 0-5 Units, Subcutaneous, QHS, Sheikh, Omair [place], DO, 4 Units at 11/01/23 2142   insulin aspart (novoLOG) injection 6 Units, 6 Units, Subcutaneous, TID WC, Elgergawy, Leana Roe, MD   insulin glargine (LANTUS) injection 40 Units, 40 Units, Subcutaneous, Daily, Elgergawy, Leana Roe, MD, 40 Units at 11/02/23 0951   insulin pump, , Subcutaneous, TID WC, HS, 0200, Elgergawy, Leana Roe, MD   levothyroxine (SYNTHROID) tablet 100 mcg, 100 mcg, Oral, Q0600, Elgergawy, Leana Roe, MD, 100 mcg at 11/02/23 0531   montelukast (SINGULAIR) tablet  10 mg, 10 mg, Oral, BH-q7a, Elgergawy, Leana Roe, MD, 10 mg at 11/02/23 8119   mycophenolate (CELLCEPT) capsule 1,000 mg, 1,000 mg, Oral, BID, Briscoe Burns, MD, 1,000 mg at 11/02/23 0921   ondansetron (ZOFRAN) injection 4 mg, 4 mg, Intravenous, Q6H PRN, Marland Mcalpine, Omair Latif, DO, 4 mg at 11/01/23 1552   pantoprazole (PROTONIX) EC tablet 40 mg, 40 mg, Oral, Daily, Briscoe Burns, MD, 40 mg at 11/02/23 0913   predniSONE (DELTASONE) tablet 7.5 mg, 7.5 mg, Oral, q morning, Elgergawy, Leana Roe, MD, 7.5 mg at 11/02/23 0912   pregabalin (LYRICA) capsule 25 mg, 25 mg, Oral, BID, Briscoe Burns, MD,  25 mg at 11/02/23 0913   rosuvastatin (CRESTOR) tablet 40 mg, 40 mg, Oral, Daily, Briscoe Burns, MD, 40 mg at 11/02/23 1610   tamoxifen (NOLVADEX) tablet 20 mg, 20 mg, Oral, Daily, Elgergawy, Leana Roe, MD, 20 mg at 11/02/23 0914   Vitamin D (Ergocalciferol) (DRISDOL) 1.25 MG (50000 UNIT) capsule 50,000 Units, 50,000 Units, Oral, Q Mon, Elgergawy, Leana Roe, MD, 50,000 Units at 11/01/23 1023  Patients Current Diet:  Diet Order             Diet - low sodium heart healthy           Diet Carb Modified Fluid consistency: Thin; Room service appropriate? Yes  Diet effective now                  Precautions / Restrictions Precautions Precautions: Fall Restrictions Weight Bearing Restrictions Per Provider Order: No   Has the patient had 2 or more falls or a fall with injury in the past year?No  Prior Activity Level Limited Community (1-2x/wk): leaves house for doctors' appointments  Prior Functional Level Prior Function Prior Level of Function : Independent/Modified Independent Mobility Comments: walks with RW ADLs Comments: ind, cooks/cleans, manages meds  Self Care: Did the patient need help bathing, dressing, using the toilet or eating?  Independent  Indoor Mobility: Did the patient need assistance with walking from room to room (with or without device)? Independent  Stairs: Did the  patient need assistance with internal or external stairs (with or without device)? Independent  Functional Cognition: Did the patient need help planning regular tasks such as shopping or remembering to take medications? Independent  Patient Information Are you of Hispanic, Latino/a,or Spanish origin?: A. No, not of Hispanic, Latino/a, or Spanish origin What is your race?: B. Black or African American Do you need or want an interpreter to communicate with a doctor or health care staff?: 0. No  Patient's Response To:  Health Literacy and Transportation Is the patient able to respond to health literacy and transportation needs?: Yes Health Literacy - How often do you need to have someone help you when you read instructions, pamphlets, or other written material from your doctor or pharmacy?: Never In the past 12 months, has lack of transportation kept you from medical appointments or from getting medications?: No In the past 12 months, has lack of transportation kept you from meetings, work, or from getting things needed for daily living?: No  Home Assistive Devices / Equipment Home Equipment: Agricultural consultant (2 wheels), Tub bench, Grab bars - tub/shower  Prior Device Use: Indicate devices/aids used by the patient prior to current illness, exacerbation or injury? None of the above  Current Functional Level Cognition  Orientation Level: Oriented X4    Extremity Assessment (includes Sensation/Coordination)  Upper Extremity Assessment: RUE deficits/detail, Right hand dominant (LUE WFL) RUE Deficits / Details: Generally weak with weak/loose grasp, R shoulder AROM of 90 degrees and maintain further ROM when passively positioned. Impaired 2 point discrimination as well throughout forearm but intact in hand. RUE overall 3/5 moves against gravity but not tolerable to resistance RUE: Shoulder pain with ROM RUE Sensation: decreased light touch, decreased proprioception RUE Coordination: decreased  fine motor, decreased gross motor  Lower Extremity Assessment: RLE deficits/detail RLE Deficits / Details: AAROM WFL, strength hip flexion 2-/5, knee extension 3+/5, ankle DF 2/5 RLE Sensation: decreased light touch RLE Coordination: decreased gross motor    ADLs  Overall ADL's : Needs assistance/impaired Eating/Feeding: Sitting, Minimal assistance Eating/Feeding Details (  indicate cue type and reason): able to use LUE to feed, gave pt red tubing for built up handles when needed for RUE Grooming: Wash/dry face, Standing, Contact guard assist Grooming Details (indicate cue type and reason): incorporating bilat intergation Upper Body Bathing: Sitting, Set up Lower Body Bathing: Sitting/lateral leans, Minimal assistance Upper Body Dressing : Sitting, Moderate assistance Upper Body Dressing Details (indicate cue type and reason): assists with RUE Lower Body Dressing: Moderate assistance, Sitting/lateral leans Toilet Transfer: Minimal assistance, Rolling walker (2 wheels), Ambulation Toileting- Clothing Manipulation and Hygiene: Sit to/from stand, Minimal assistance Functional mobility during ADLs: Minimal assistance, Rolling walker (2 wheels)    Mobility  Overal bed mobility: Needs Assistance Bed Mobility: Supine to Sit, Sit to Supine Supine to sit: Min assist Sit to supine: Min assist General bed mobility comments: Min A to assist BLEs to EOB and return to bed    Transfers  Overall transfer level: Needs assistance Equipment used: Rolling walker (2 wheels) Transfers: Sit to/from Stand Sit to Stand: Min assist General transfer comment: up from tall stretcher in ED    Ambulation / Gait / Stairs / Wheelchair Mobility  Ambulation/Gait Ambulation/Gait assistance: Min assist, Mod assist Gait Distance (Feet): 20 Feet Assistive device: Rolling walker (2 wheels) Gait Pattern/deviations: Step-to pattern, Decreased step length - right, Decreased stride length, Drifts right/left, Decreased  dorsiflexion - right General Gait Details: assist to progress the R LE, assist for balance and walker management on turns    Posture / Balance Balance Overall balance assessment: Needs assistance Sitting balance-Leahy Scale: Good Standing balance support: Bilateral upper extremity supported Standing balance-Leahy Scale: Poor Standing balance comment: UE support and CGA for balance in static standing    Special needs/care consideration Insulin Pump Hgb A1c 9.2 CPAP at HS   Previous Home Environment  Living Arrangements: Alone Available Help at Discharge: Family, Available 24 hours/day Type of Home: House Home Layout: One level Home Access: Stairs to enter Entrance Stairs-Rails: None Entrance Stairs-Number of Steps: 2 Bathroom Shower/Tub: Associate Professor: Yes How Accessible: Accessible via walker Home Care Services: No  Discharge Living Setting Plans for Discharge Living Setting: Patient's home Type of Home at Discharge: House Discharge Home Layout: One level Discharge Home Access: Stairs to enter Entrance Stairs-Rails: None Entrance Stairs-Number of Steps: 2 Discharge Bathroom Shower/Tub: Tub/shower unit Discharge Bathroom Toilet: Standard Discharge Bathroom Accessibility: Yes How Accessible: Accessible via walker Does the patient have any problems obtaining your medications?: Yes (Describe) (expensive)  Social/Family/Support Systems Anticipated Caregiver: [name] (sister) and other siblings Anticipated Caregiver's Contact Information: Darlene: [phone] Caregiver Availability: 24/7 Discharge Plan Discussed with Primary Caregiver: Yes Is Caregiver In Agreement with Plan?: Yes Does Caregiver/Family have Issues with Lodging/Transportation while Pt is in Rehab?: No  Goals Patient/Family Goal for Rehab: Supervison: PT/OT Expected length of stay: 7-10 days Pt/Family Agrees to Admission and willing to participate:  Yes Program Orientation Provided & Reviewed with Pt/Caregiver Including Roles  & Responsibilities: Yes  Decrease burden of Care through IP rehab admission: n/a  Possible need for SNF placement upon discharge:not anticipated  Patient Condition: This patient's condition remains as documented in the consult dated 11/01/23, in which the Rehabilitation Physician determined and documented that the patient's condition is appropriate for intensive rehabilitative care in an inpatient rehabilitation facility. Will admit to inpatient rehab today.  Preadmission Screen Completed By: Wolfgang Phoenix with updates by  Clois Dupes, RN, 11/02/2023 10:38 AM ______________________________________________________________________   Discussed status with Dr. Berline Chough on 11/02/23  at 1036 and received approval for admission today.  Admission Coordinator:  Wolfgang Phoenix with updates by Clois Dupes, time 1610 Date 11/02/23

## 2023-11-02 NOTE — Progress Notes (Signed)
 Physical Therapy Treatment Patient Details Name: [name] MRN: [id number] DOB: [birth year] Today's Date: 11/02/2023   History of Present Illness Pt is a 73 yo female presenting to [facility] on 10/30/23 with R sided weakness and numbness. MRI showed acute infarct of the R pons and L proximal ICA stenosis. PMHx significant for seronegative myasthenia gravis, HTN, Hx of CVA, carotid artery stenosis and paraneoplastic retinopathy, chemo and radiation therapy.    PT Comments  Pt resting in bed on arrival and eager for mobility with continues progress towards acute goals. Pt requiring grossly min A for bed mobility, transfers and gait with RW for support with assist needed to manage RLE in bed and during gait as pt with noted RLE weakness. Pt with some R inattention, needing light cues to attend to obstacles on R, lightly bumping sink and door frame on R, with ability to correct with cues. Current plan remains appropriate to address deficits and maximize functional independence and decrease caregiver burden. Pt continues to benefit from skilled PT services to progress toward functional mobility goals.      If plan is discharge home, recommend the following: A little help with walking and/or transfers;A little help with bathing/dressing/bathroom;Assistance with cooking/housework;Direct supervision/assist for medications management;Assist for transportation;Help with stairs or ramp for entrance   Can travel by private vehicle        Equipment Recommendations       Recommendations for Other Services       Precautions / Restrictions Precautions Precautions: Fall Recall of Precautions/Restrictions: Intact Restrictions Weight Bearing Restrictions Per Provider Order: No     Mobility  Bed Mobility Overal bed mobility: Needs Assistance Bed Mobility: Supine to Sit, Sit to Supine     Supine to sit: Min assist Sit to supine: Min assist   General bed mobility comments: Min A to assist BLEs to EOB  and return to bed    Transfers Overall transfer level: Needs assistance Equipment used: Rolling walker (2 wheels) Transfers: Sit to/from Stand Sit to Stand: Min assist           General transfer comment: min A to boost to stand with cues for hand placement    Ambulation/Gait Ambulation/Gait assistance: Min assist Gait Distance (Feet): 35 Feet Assistive device: Rolling walker (2 wheels) Gait Pattern/deviations: Step-to pattern, Decreased step length - right, Decreased stride length, Drifts right/left, Decreased dorsiflexion - right, Step-through pattern Gait velocity: decr     General Gait Details: step-to progressing to step-through with assist to advance RLE, noted hip hike on R to clear RLE during swing   Stairs             Wheelchair Mobility     Tilt Bed    Modified Rankin (Stroke Patients Only) Modified Rankin (Stroke Patients Only) Pre-Morbid Rankin Score: Slight disability Modified Rankin: Moderately severe disability     Balance Overall balance assessment: Needs assistance   Sitting balance-Leahy Scale: Good     Standing balance support: Bilateral upper extremity supported Standing balance-Leahy Scale: Poor Standing balance comment: UE support and CGA for balance in static standing                            Communication Communication Communication: No apparent difficulties  Cognition Arousal: Alert Behavior During Therapy: WFL for tasks assessed/performed   PT - Cognitive impairments: No apparent impairments  Following commands: Intact      Cueing Cueing Techniques: Verbal cues  Exercises Other Exercises Other Exercises: A/AROM RLE LAQ and seated marching with focus on eccentric control    General Comments        Pertinent Vitals/Pain Pain Assessment Pain Assessment: Faces Faces Pain Scale: Hurts a little bit Pain Location: RUE Pain Descriptors / Indicators: Sore Pain  Intervention(s): Monitored during session, Limited activity within patient's tolerance    Home Living                          Prior Function            PT Goals (current goals can now be found in the care plan section) Acute Rehab PT Goals Patient Stated Goal: return to independent PT Goal Formulation: With patient Time For Goal Achievement: 11/13/23 Progress towards PT goals: Progressing toward goals    Frequency    Min 3X/week      PT Plan      Co-evaluation              AM-PAC PT "6 Clicks" Mobility   Outcome Measure  Help needed turning from your back to your side while in a flat bed without using bedrails?: A Little Help needed moving from lying on your back to sitting on the side of a flat bed without using bedrails?: A Little Help needed moving to and from a bed to a chair (including a wheelchair)?: A Little Help needed standing up from a chair using your arms (e.g., wheelchair or bedside chair)?: A Little Help needed to walk in hospital room?: A Lot Help needed climbing 3-5 steps with a railing? : Total 6 Click Score: 15    End of Session Equipment Utilized During Treatment: Gait belt Activity Tolerance: Patient tolerated treatment well Patient left: in bed;with call bell/phone within reach Nurse Communication: Mobility status PT Visit Diagnosis: Other abnormalities of gait and mobility (R26.89);Hemiplegia and hemiparesis;Muscle weakness (generalized) (M62.81) Hemiplegia - Right/Left: Right Hemiplegia - dominant/non-dominant: Dominant Hemiplegia - caused by: Cerebral infarction     Time: 1043-1106 PT Time Calculation (min) (ACUTE ONLY): 23 min  Charges:    $Gait Training: 8-22 mins $Therapeutic Exercise: 8-22 mins PT General Charges $$ ACUTE PT VISIT: 1 Visit                     Kyle Stansell R. PTA Acute Rehabilitation Services Office: [phone]   Catalina Antigua 11/02/2023, 12:13 PM

## 2023-11-03 ENCOUNTER — Other Ambulatory Visit: Payer: Medicare Other

## 2023-11-03 ENCOUNTER — Ambulatory Visit: Payer: Medicare Other | Admitting: Hematology

## 2023-11-03 ENCOUNTER — Inpatient Hospital Stay (HOSPITAL_COMMUNITY)

## 2023-11-03 DIAGNOSIS — M7989 Other specified soft tissue disorders: Secondary | ICD-10-CM

## 2023-11-03 DIAGNOSIS — I6350 Cerebral infarction due to unspecified occlusion or stenosis of unspecified cerebral artery: Secondary | ICD-10-CM | POA: Diagnosis not present

## 2023-11-03 LAB — CBC WITH DIFFERENTIAL/PLATELET
Abs Immature Granulocytes: 0.02 10*3/uL (ref 0.00–0.07)
Basophils Absolute: 0 10*3/uL (ref 0.0–0.1)
Basophils Relative: 1 %
Eosinophils Absolute: 0.1 10*3/uL (ref 0.0–0.5)
Eosinophils Relative: 2 %
HCT: 33.2 % — ABNORMAL LOW (ref 36.0–46.0)
Hemoglobin: 10.9 g/dL — ABNORMAL LOW (ref 12.0–15.0)
Immature Granulocytes: 0 %
Lymphocytes Relative: 29 %
Lymphs Abs: 1.7 10*3/uL (ref 0.7–4.0)
MCH: 30.1 pg (ref 26.0–34.0)
MCHC: 32.8 g/dL (ref 30.0–36.0)
MCV: 91.7 fL (ref 80.0–100.0)
Monocytes Absolute: 0.6 10*3/uL (ref 0.1–1.0)
Monocytes Relative: 10 %
Neutro Abs: 3.5 10*3/uL (ref 1.7–7.7)
Neutrophils Relative %: 58 %
Platelets: 221 10*3/uL (ref 150–400)
RBC: 3.62 MIL/uL — ABNORMAL LOW (ref 3.87–5.11)
RDW: 12.1 % (ref 11.5–15.5)
WBC: 6 10*3/uL (ref 4.0–10.5)
nRBC: 0 % (ref 0.0–0.2)

## 2023-11-03 LAB — COMPREHENSIVE METABOLIC PANEL
ALT: 17 U/L (ref 0–44)
AST: 24 U/L (ref 15–41)
Albumin: 3 g/dL — ABNORMAL LOW (ref 3.5–5.0)
Alkaline Phosphatase: 83 U/L (ref 38–126)
Anion gap: 9 (ref 5–15)
BUN: 18 mg/dL (ref 8–23)
CO2: 25 mmol/L (ref 22–32)
Calcium: 9.2 mg/dL (ref 8.9–10.3)
Chloride: 105 mmol/L (ref 98–111)
Creatinine, Ser: 1.06 mg/dL — ABNORMAL HIGH (ref 0.44–1.00)
GFR, Estimated: 57 mL/min — ABNORMAL LOW (ref >60)
Glucose, Bld: 86 mg/dL (ref 70–99)
Potassium: 3.6 mmol/L (ref 3.5–5.1)
Sodium: 139 mmol/L (ref 135–145)
Total Bilirubin: 0.4 mg/dL (ref 0.0–1.2)
Total Protein: 6.8 g/dL (ref 6.5–8.1)

## 2023-11-03 LAB — GLUCOSE, CAPILLARY
Glucose-Capillary: 175 mg/dL — ABNORMAL HIGH (ref 70–99)
Glucose-Capillary: 217 mg/dL — ABNORMAL HIGH (ref 70–99)

## 2023-11-03 MED ORDER — SORBITOL 70 % SOLN
30.0000 mL | Freq: Every day | Status: DC | PRN
  Administered 2023-11-03: 30 mL via ORAL
  Filled 2023-11-03 (×3): qty 30

## 2023-11-03 MED ORDER — AMLODIPINE BESYLATE 5 MG PO TABS: 5.0000 mg | ORAL_TABLET | Freq: Every day | ORAL | Status: DC

## 2023-11-03 NOTE — Progress Notes (Signed)
 Inpatient Rehabilitation  Patient information reviewed and entered into eRehab system by Feliberto Gottron, M.A., CCC-SLP, Rehab Quality Coordinator.  Information including medical coding, functional ability and quality indicators will be reviewed and updated through discharge.

## 2023-11-03 NOTE — Plan of Care (Signed)
  Problem: RH Balance Goal: LTG Patient will maintain dynamic standing with ADLs (OT) Description: LTG:  Patient will maintain dynamic standing balance with assist during activities of daily living (OT)  Flowsheets (Taken 11/03/2023 1159) LTG: Pt will maintain dynamic standing balance during ADLs with: Independent with assistive device   Problem: Sit to Stand Goal: LTG:  Patient will perform sit to stand in prep for activites of daily living with assistance level (OT) Description: LTG:  Patient will perform sit to stand in prep for activites of daily living with assistance level (OT) Flowsheets (Taken 11/03/2023 1159) LTG: PT will perform sit to stand in prep for activites of daily living with assistance level: Independent with assistive device   Problem: RH Bathing Goal: LTG Patient will bathe all body parts with assist levels (OT) Description: LTG: Patient will bathe all body parts with assist levels (OT) Flowsheets (Taken 11/03/2023 1159) LTG: Pt will perform bathing with assistance level/cueing: Independent with assistive device    Problem: RH Dressing Goal: LTG Patient will perform upper body dressing (OT) Description: LTG Patient will perform upper body dressing with assist, with/without cues (OT). Flowsheets (Taken 11/03/2023 1159) LTG: Pt will perform upper body dressing with assistance level of: Independent with assistive device Goal: LTG Patient will perform lower body dressing w/assist (OT) Description: LTG: Patient will perform lower body dressing with assist, with/without cues in positioning using equipment (OT) Flowsheets (Taken 11/03/2023 1159) LTG: Pt will perform lower body dressing with assistance level of: Independent with assistive device   Problem: RH Toileting Goal: LTG Patient will perform toileting task (3/3 steps) with assistance level (OT) Description: LTG: Patient will perform toileting task (3/3 steps) with assistance level (OT)  Flowsheets (Taken 11/03/2023  1159) LTG: Pt will perform toileting task (3/3 steps) with assistance level: Independent with assistive device   Problem: RH Simple Meal Prep Goal: LTG Patient will perform simple meal prep w/assist (OT) Description: LTG: Patient will perform simple meal prep with assistance, with/without cues (OT). Flowsheets (Taken 11/03/2023 1159) LTG: Pt will perform simple meal prep with assistance level of: Independent with assistive device   Problem: RH Toilet Transfers Goal: LTG Patient will perform toilet transfers w/assist (OT) Description: LTG: Patient will perform toilet transfers with assist, with/without cues using equipment (OT) Flowsheets (Taken 11/03/2023 1159) LTG: Pt will perform toilet transfers with assistance level of: Independent with assistive device   Problem: RH Tub/Shower Transfers Goal: LTG Patient will perform tub/shower transfers w/assist (OT) Description: LTG: Patient will perform tub/shower transfers with assist, with/without cues using equipment (OT) Flowsheets (Taken 11/03/2023 1159) LTG: Pt will perform tub/shower stall transfers with assistance level of: Independent with assistive device

## 2023-11-03 NOTE — Progress Notes (Signed)
 Inpatient Rehabilitation Care Coordinator Assessment and Plan Patient Details  Name: [name] MRN: [id number] Date of Birth: 1950-09-05  Today's Date: 11/03/2023  Hospital Problems: Principal Problem:   Right pontine stroke [facility])  Past Medical History:  Past Medical History:  Diagnosis Date   Arthritis    "all over my body" (03/13/2013)   Asthma    Asymptomatic carotid artery stenosis, bilateral 10/08/2018   Breast cancer (HCC)    GERD (gastroesophageal reflux disease)    H/O hiatal hernia    Headache    pt states she has had headaches for about 6 months "off and on"   Heart murmur    pt had echocardiogram on 09/17/21   Hypercholesterolemia 10/08/2018   Hypertension    Hypothyroidism    Myasthenia gravis (HCC)    "in my eyes; diagnsosed > 7 yr ago" (03/13/2013)   Sleep apnea    on CPAP   Stroke (HCC) 2021   Thyroid carcinoma (HCC)    Type II diabetes mellitus (HCC)    Past Surgical History:  Past Surgical History:  Procedure Laterality Date   ABDOMINAL HYSTERECTOMY     ANTERIOR CERVICAL DECOMP/DISCECTOMY FUSION     "I've had severa ORs; always went in from the front" (03/13/2013)   APPENDECTOMY     BREAST LUMPECTOMY WITH RADIOACTIVE SEED AND SENTINEL LYMPH NODE BIOPSY Left 09/02/2022   Procedure: LEFT BREAST LUMPECTOMY WITH RADIOACTIVE SEED AND SENTINEL LYMPH NODE BIOPSY;  Surgeon: Abigail Miyamoto, MD;  Location: [facility] OR;  Service: General;  Laterality: Left;   CARDIAC CATHETERIZATION     "several" (03/13/2013)   CARPAL TUNNEL RELEASE Right    CATARACT EXTRACTION W/ INTRAOCULAR LENS  IMPLANT, BILATERAL Bilateral    CHOLECYSTECTOMY     KNEE ARTHROSCOPY Left    SHOULDER ARTHROSCOPY W/ ROTATOR CUFF REPAIR Left    TONSILLECTOMY     TOTAL THYROIDECTOMY     TRANSCAROTID ARTERY REVASCULARIZATION  Left 09/24/2021   Procedure: LEFT TRANSCAROTID ARTERY REVASCULARIZATION;  Surgeon: Leonie Douglas, MD;  Location: [facility] OR;  Service: Vascular;  Laterality: Left;    TRANSCAROTID ARTERY REVASCULARIZATION  Right 10/23/2021   Procedure: Right Transcarotid Artery Revascularization;  Surgeon: Leonie Douglas, MD;  Location: [facility] OR;  Service: Vascular;  Laterality: Right;   ULTRASOUND GUIDANCE FOR VASCULAR ACCESS Right 09/24/2021   Procedure: ULTRASOUND GUIDANCE FOR VASCULAR ACCESS;  Surgeon: Leonie Douglas, MD;  Location: [facility] OR;  Service: Vascular;  Laterality: Right;   ULTRASOUND GUIDANCE FOR VASCULAR ACCESS Right 10/23/2021   Procedure: ULTRASOUND GUIDANCE FOR VASCULAR ACCESS, LEFT FEMORAL VEIN;  Surgeon: Leonie Douglas, MD;  Location: [facility] OR;  Service: Vascular;  Laterality: Right;   Social History:  reports that she has never smoked. She has never used smokeless tobacco. She reports that she does not drink alcohol and does not use drugs.  Family / Support Systems Marital Status: Single Spouse/Significant Other: N/A Children: No children Other Supports: Sisters- [name] and Public relations account executive Anticipated Caregiver: Both sisters Ability/Limitations of Caregiver: Pt lives alone and will have support from her two sisters. Pt sister [name] lives across the street and will be assisting her. Caregiver Availability: 24/7 Family Dynamics: Pt lives alone.  Social History Preferred language: English Religion: Methodist Cultural Background: Pt has held various jobs. Last job was at [employer] and she retired in 2011. Education: 2 years college Health Literacy - How often do you need to have someone help you when you read instructions, pamphlets, or other written material from your doctor  or pharmacy?: Never Writes: Yes Employment Status: Retired Date Retired/Disabled/Unemployed: 2011 Marine scientist Issues: Denies Guardian/Conservator: N/A; pt has packet for Living Will/Advanced Care Directive   Abuse/Neglect Abuse/Neglect Assessment Can Be Completed: Yes Physical Abuse: Denies Verbal Abuse: Denies Sexual Abuse: Denies Exploitation of  patient/patient's resources: Denies Self-Neglect: Denies  Patient response to: Social Isolation - How often do you feel lonely or isolated from those around you?: Never  Emotional Status Pt's affect, behavior and adjustment status: Pt in good spirits at time of visit Recent Psychosocial Issues: Denies Psychiatric History: Denies Substance Abuse History: Denies  Patient / Family Perceptions, Expectations & Goals Pt/Family understanding of illness & functional limitations: Pt and family have a general understanding of pt care needs Premorbid pt/family roles/activities: Independent Anticipated changes in roles/activities/participation: Assistance with ADLs/iADLs Pt/family expectations/goals: Pt goal is to work on walking  [employer]: None Premorbid Home Care/DME Agencies: None Transportation available at discharge: TBD Is the patient able to respond to transportation needs?: Yes In the past 12 months, has lack of transportation kept you from medical appointments or from getting medications?: No In the past 12 months, has lack of transportation kept you from meetings, work, or from getting things needed for daily living?: No Resource referrals recommended: Neuropsychology  Discharge Planning Living Arrangements: Alone Support Systems: Other relatives Type of Residence: Private residence Insurance Resources: Media planner (specify) ([employer] Medicare) Surveyor, quantity Resources: Restaurant manager, fast food Screen Referred: No Living Expenses: Banker Management: Patient Does the patient have any problems obtaining your medications?: No Home Management: Pt manages all home care needs Patient/Family Preliminary Plans: TBD Care Coordinator Barriers to Discharge: Lack of/limited family support, Decreased caregiver support Care Coordinator Anticipated Follow Up Needs: HH/OP Expected length of stay: D/C date 3/24  Clinical Impression SW met with pt and pt  sister [name] to introduce self, explain role, and discuss discharge process, and inform on ELOS. Pt is not a Cytogeneticist. NO HCPOA. DME: RW, TTB.  Gretchen Short 11/03/2023, 9:42 PM

## 2023-11-03 NOTE — Progress Notes (Signed)
   11/03/23 1149  Spiritual Encounters  Type of Visit Initial  Care provided to: Patient  Reason for visit Advance directives  OnCall Visit No   Provided follow-up visit with patient to discuss AD. Left paperwork with patient who will contact chaplain's office once completed.

## 2023-11-03 NOTE — Evaluation (Signed)
 Occupational Therapy Assessment and Plan  Patient Details  Name: CHONTE RICKE MRN: [id number] Date of Birth: 1951/07/11  OT Diagnosis: acute pain, muscle weakness (generalized), and R hemiparesis Rehab Potential: Rehab Potential (ACUTE ONLY): Good ELOS: 10-12 days   Today's Date: 11/03/2023 OT Individual Time: 1005-1100 OT Individual Time Calculation (min): 55 min     Hospital Problem: Principal Problem:   Right pontine stroke [facility])   Past Medical History:  Past Medical History:  Diagnosis Date   Arthritis    "all over my body" (03/13/2013)   Asthma    Asymptomatic carotid artery stenosis, bilateral 10/08/2018   Breast cancer (HCC)    GERD (gastroesophageal reflux disease)    H/O hiatal hernia    Headache    pt states she has had headaches for about 6 months "off and on"   Heart murmur    pt had echocardiogram on 09/17/21   Hypercholesterolemia 10/08/2018   Hypertension    Hypothyroidism    Myasthenia gravis (HCC)    "in my eyes; diagnsosed > 7 yr ago" (03/13/2013)   Sleep apnea    on CPAP   Stroke (HCC) 2021   Thyroid carcinoma (HCC)    Type II diabetes mellitus (HCC)    Past Surgical History:  Past Surgical History:  Procedure Laterality Date   ABDOMINAL HYSTERECTOMY     ANTERIOR CERVICAL DECOMP/DISCECTOMY FUSION     "I've had severa ORs; always went in from the front" (03/13/2013)   APPENDECTOMY     BREAST LUMPECTOMY WITH RADIOACTIVE SEED AND SENTINEL LYMPH NODE BIOPSY Left 09/02/2022   Procedure: LEFT BREAST LUMPECTOMY WITH RADIOACTIVE SEED AND SENTINEL LYMPH NODE BIOPSY;  Surgeon: Abigail Miyamoto, MD;  [facility] OR;  Service: General;  Laterality: Left;   CARDIAC CATHETERIZATION     "several" (03/13/2013)   CARPAL TUNNEL RELEASE Right    CATARACT EXTRACTION W/ INTRAOCULAR LENS  IMPLANT, BILATERAL Bilateral    CHOLECYSTECTOMY     KNEE ARTHROSCOPY Left    SHOULDER ARTHROSCOPY W/ ROTATOR CUFF REPAIR Left    TONSILLECTOMY     TOTAL THYROIDECTOMY      TRANSCAROTID ARTERY REVASCULARIZATION  Left 09/24/2021   Procedure: LEFT TRANSCAROTID ARTERY REVASCULARIZATION;  Surgeon: Leonie Douglas, MD;  [facility] OR;  Service: Vascular;  Laterality: Left;   TRANSCAROTID ARTERY REVASCULARIZATION  Right 10/23/2021   Procedure: Right Transcarotid Artery Revascularization;  Surgeon: Leonie Douglas, MD;  [facility] OR;  Service: Vascular;  Laterality: Right;   ULTRASOUND GUIDANCE FOR VASCULAR ACCESS Right 09/24/2021   Procedure: ULTRASOUND GUIDANCE FOR VASCULAR ACCESS;  Surgeon: Leonie Douglas, MD;  [facility] OR;  Service: Vascular;  Laterality: Right;   ULTRASOUND GUIDANCE FOR VASCULAR ACCESS Right 10/23/2021   Procedure: ULTRASOUND GUIDANCE FOR VASCULAR ACCESS, LEFT FEMORAL VEIN;  Surgeon: Leonie Douglas, MD;  [facility] OR;  Service: Vascular;  Laterality: Right;    Assessment & Plan Clinical Impression:  Marcella Charlson is a 73 year old female who presented to the ED [facility] on 10/29/2023 with right facial, upper and lower extremity numbness. Reported new onset of dizziness for approximately one week prior to presentation at ED. [facility] neuro[facility]ist telemedicine consult obtained.  Initial workup revealed CT head with aspect score of 10.  Emergent vessel imaging not performed due to anaphylaxis to iodinated contrast.  Per Dr. Wilford Corner, her exam was not consistent with LVO.  He recommended transfer to [facility] for [facility].  She was evaluated by Dr. Amada Jupiter and her MRI was  positive for 5 mm acute infarct within the dorsal pons on the right.  MRA with suspected flow-limiting stenosis in the left proximal ICA.Marland Kitchen  She was loaded with Plavix 300 mg and started on aspirin 81 mg.  She also underwent MRA of head and neck.  2D echo with ejection fraction of 60 to 65%, LDL 41, hemoglobin A1c 9.2%.  She was started on Lovenox for VTE prophylaxis.  She will continue on DAPT for 3 weeks followed by Plavix alone.   Her past medical history significant for  prior left hemispheric TIA and left thalamic infarct, bilateral ICA stents, chronic headaches, diabetes mellitus, hypothyroidism, hypertension and diagnosis of breast cancer August, 2024.  She is being treated with tamoxifen.  She has a history of ocular myasthenia gravis and a history of thyroid cancer status post radiation and total thyroidectomy.  She has an insulin pump which will be reinitiated on admission to CIR.   She is requiring grossly min assist for bed mobility, transfers and gait with rolling walker for support.  Assist needed to manage right lower extremity in bed and during gait due to right lower extremity weakness.  Mild right inattention.  Rating carb modified diet with thin liquids. The patient requires inpatient medicine and rehabilitation evaluations and services for ongoing dysfunction secondary to right pontine infarct. Patient transferred to CIR on 11/02/2023 .    Patient currently requires mod with basic self-care skills secondary to muscle weakness, decreased cardiorespiratoy endurance, decreased coordination and decreased motor planning, and decreased standing balance and hemiplegia.  Prior to hospitalization, patient could complete self-care with mod I.  Patient will benefit from skilled intervention to decrease level of assist with basic self-care skills, increase independence with basic self-care skills, and increase level of independence with iADL prior to discharge home independently.  Anticipate patient will require intermittent supervision and follow up outpatient.  OT - End of Session Activity Tolerance: Tolerates < 10 min activity, no significant change in vital signs Endurance Deficit: Yes Endurance Deficit Description: limited by lightheadedness, nausea, and lethargy OT Assessment Rehab Potential (ACUTE ONLY): Good OT Barriers to Discharge: Inaccessible home environment;Home environment access/layout OT Patient demonstrates impairments in the following area(s):  Balance;Endurance;Motor;Pain;Sensory OT Basic ADL's Functional Problem(s): Bathing;Dressing;Toileting OT Advanced ADL's Functional Problem(s): Simple Meal Preparation OT Transfers Functional Problem(s): Toilet;Tub/Shower OT Additional Impairment(s): Fuctional Use of Upper Extremity OT Plan OT Intensity: Minimum of 1-2 x/day, 45 to 90 minutes OT Frequency: 5 out of 7 days OT Duration/Estimated Length of Stay: 10-12 days OT Treatment/Interventions: Balance/vestibular training;Discharge planning;Pain management;Self Care/advanced ADL retraining;Therapeutic Activities;UE/LE Coordination activities;Disease mangement/prevention;Functional mobility training;Patient/family education;Skin care/wound managment;Therapeutic Exercise;Visual/perceptual remediation/compensation;DME/adaptive equipment instruction;Neuromuscular re-education;Psychosocial support;Splinting/orthotics;UE/LE Strength taining/ROM OT Self Feeding Anticipated Outcome(s): Mod I OT Basic Self-Care Anticipated Outcome(s): Mod I OT Toileting Anticipated Outcome(s): Mod I OT Bathroom Transfers Anticipated Outcome(s): Mod I OT Recommendation Recommendations for Other Services: Therapeutic Recreation consult Therapeutic Recreation Interventions: Pet therapy;Stress management Patient destination: Home Follow Up Recommendations: Outpatient OT Equipment Recommended: To be determined   OT Evaluation Precautions/Restrictions  Precautions Precautions: Fall Precaution/Restrictions Comments: R hemiparesis Restrictions Weight Bearing Restrictions Per Provider Order: No Home Living/Prior Functioning Home Living Available Help at Discharge: Family, Available PRN/intermittently (has 1 sister that lives down the street, but has intermittent assist from other siblings) Type of Home: House Home Access: Stairs to enter Secretary/administrator of Steps: 2 Entrance Stairs-Rails: None Home Layout: One level Bathroom Shower/Tub: Teacher, music: Standard Bathroom Accessibility: Yes Additional Comments: pt has tub transfer bench and RW  Lives With: Alone IADL History Homemaking Responsibilities: Yes Meal Prep Responsibility: Primary Leisure and Hobbies: Puzzles Prior Function Level of Independence: Requires assistive device for independence, Independent with basic ADLs  Able to Take Stairs?: Yes Driving: No Vision Baseline Vision/History: 1 Wears glasses Ability to See in Adequate Light: 1 Impaired Patient Visual Report: Blurring of vision;Diplopia (pt reports both symptoms are baseline due to MG diagnosis) Vision Assessment?: No apparent visual deficits Eye Alignment: Within Functional Limits Additional Comments: pt reports having floaters, vision in better in R eye Perception  Perception: Within Functional Limits Praxis Praxis: WFL Cognition Cognition Overall Cognitive Status: Within Functional Limits for tasks assessed Arousal/Alertness: Awake/alert Orientation Level: Person;Place;Situation Person: Oriented Place: Oriented Situation: Oriented Memory: Appears intact Awareness: Appears intact Problem Solving: Appears intact Safety/Judgment: Appears intact Brief Interview for Mental Status (BIMS) Repetition of Three Words (First Attempt): 3 Temporal Orientation: Year: Correct Temporal Orientation: Month: Accurate within 5 days Temporal Orientation: Day: Correct Recall: "Sock": Yes, no cue required Recall: "Blue": Yes, no cue required Recall: "Bed": Yes, no cue required BIMS Summary Score: 15 Sensation Sensation Light Touch: Impaired Detail Light Touch Impaired Details: Impaired RLE;Impaired RUE Hot/Cold: Not tested Proprioception: Impaired by gross assessment Stereognosis: Not tested Additional Comments: decreased sensation along RLE, worsening at distal aspect of leg. Pt also reports hypersensitivity along RLE with light touch and "tightness" in upper RUE. Coordination Gross Motor  Movements are Fluid and Coordinated: No Fine Motor Movements are Fluid and Coordinated: No Coordination and Movement Description: grossly uncoordinated due to R hemiparesis, visual impairments, and decreased standing balance/coordination Finger Nose Finger Test: WFL on LUE, slower on RUE. Heel Shin Test: unable to perform on RLE, slow and decresaed ROM on LLE Motor  Motor Motor: Hemiplegia Motor - Skilled Clinical Observations: R hemiparesis, visual impairments, and decreased standing balance/coordination  Trunk/Postural Assessment  Cervical Assessment Cervical Assessment: Exceptions to [facility] (forward head) Thoracic Assessment Thoracic Assessment: Exceptions to [facility] (rounded shoulders) Lumbar Assessment Lumbar Assessment: Exceptions to [facility] (posterior pelvic tilt) Postural Control Postural Control: Deficits on evaluation Righting Reactions: anticipate delayed on R in standing Protective Responses: anticipate delayed on R in standing  Balance Balance Balance Assessed: Yes Static Sitting Balance Static Sitting - Balance Support: Feet supported;Bilateral upper extremity supported Static Sitting - Level of Assistance: 5: Stand by assistance (supervision) Dynamic Sitting Balance Dynamic Sitting - Balance Support: Feet supported;No upper extremity supported Dynamic Sitting - Level of Assistance: 5: Stand by assistance (supervision) Extremity/Trunk Assessment RUE Assessment RUE Assessment: Exceptions to [facility] Active Range of Motion (AROM) Comments: limited to 35 degrees shoulder flexion General Strength Comments: 2-/5 grossly LUE Assessment LUE Assessment: Within Functional Limits  Care Tool Care Tool Self Care Eating   Eating Assist Level: Set up assist    Oral Care    Oral Care Assist Level: Set up assist    Bathing   Body parts bathed by patient: Right arm;Left arm;Chest;Abdomen;Right upper leg;Left upper leg;Face;Right lower leg;Left lower leg Body parts bathed by helper: Front  perineal area;Buttocks   Assist Level: Moderate Assistance - Patient 50 - 74%    Upper Body Dressing(including orthotics)   What is the patient wearing?: Pull over shirt   Assist Level: Moderate Assistance - Patient 50 - 74%    Lower Body Dressing (excluding footwear)   What is the patient wearing?: Pants Assist for lower body dressing: Moderate Assistance - Patient 50 - 74%    Putting on/Taking off footwear   What is the patient wearing?: Non-skid slipper socks Assist  for footwear: Dependent - Patient 0%       Care Tool Toileting Toileting activity   Assist for toileting: Moderate Assistance - Patient 50 - 74%     Care Tool Bed Mobility Roll left and right activity   Roll left and right assist level: Minimal Assistance - Patient > 75%    Sit to lying activity   Sit to lying assist level: Moderate Assistance - Patient 50 - 74%    Lying to sitting on side of bed activity   Lying to sitting on side of bed assist level: the ability to move from lying on the back to sitting on the side of the bed with no back support.: Minimal Assistance - Patient > 75%     Care Tool Transfers Sit to stand transfer  Sit to stand assist level: Minimal Assistance - Patient > 75%    Chair/bed transfer Chair/bed transfer activity did not occur: Safety/medical concerns (nausea, lethargy)       Toilet transfer   Assist Level: Minimal Assistance - Patient > 75%     Care Tool Cognition  Expression of Ideas and Wants Expression of Ideas and Wants: 4. Without difficulty (complex and basic) - expresses complex messages without difficulty and with speech that is clear and easy to understand  Understanding Verbal and Non-Verbal Content Understanding Verbal and Non-Verbal Content: 4. Understands (complex and basic) - clear comprehension without cues or repetitions   Memory/Recall Ability Memory/Recall Ability : Current season;That he or she is in a hospital/hospital unit;Staff names and faces    Refer to Care Plan for Long Term Goals  SHORT TERM GOAL WEEK 1 OT Short Term Goal 1 (Week 1): Pt will complete sit > stand with CGA OT Short Term Goal 2 (Week 1): Pt will complete toilet transfer with CGA using [facility] OT Short Term Goal 3 (Week 1): Pt will donn LB clothing supervision using AE PRN  Recommendations for other services: Therapeutic Recreation  Pet therapy and Stress management   Skilled Therapeutic Intervention Patient received upright in bed upon therapy arrival and agreeable to participate in OT evaluation. Education provided on OT purpose, therapy schedule, goals for therapy, and safety policy while in rehab. No pain reported however verbalized dizziness and nausea. BP assessed as 157/84 sitting EOB however no active emesis.  Patient demonstrates R hemiparesis, dynamic standing balance, functional endurance, GM and FM coordination deficits resulting in difficulty completing BADL tasks without increased physical assist. Cues needed throughout for sequencing. Pt limited by nausea and dizziness when OOB, keeping eyes closed. Pt will benefit from skilled OT services to focus on mentioned deficits. See below for ADL and functional transfer performance. ADLs completed at EOB. Overall min A for sit > stand and stand pivot transfers with RW. Pt remained upright in bed at conclusion of session with bed alarm on and all needs met at end of session.   ADL ADL Eating: Set up Where Assessed-Eating: Bed level Grooming: Setup Where Assessed-Grooming: Edge of bed Upper Body Bathing: Supervision/safety Where Assessed-Upper Body Bathing: Edge of bed Lower Body Bathing: Moderate assistance Where Assessed-Lower Body Bathing: Edge of bed Upper Body Dressing: Moderate assistance Where Assessed-Upper Body Dressing: Edge of bed Lower Body Dressing: Moderate assistance Where Assessed-Lower Body Dressing: Edge of bed Toileting: Moderate assistance Where Assessed-Toileting: Bedside  Commode Toilet Transfer: Minimal assistance Toilet Transfer Method: Stand pivot Toilet Transfer Equipment: Other (comment);Bedside commode (RW) Tub/Shower Transfer: Unable to assess Tub/Shower Transfer Method: Unable to assess [employer] Transfer: Unable to  assess Film/video editor Method: Unable to assess Mobility  Bed Mobility Bed Mobility: Rolling Right;Rolling Left;Sit to Supine;Supine to Sit Rolling Right: Minimal Assistance - Patient > 75% Rolling Left: Minimal Assistance - Patient > 75% Supine to Sit: Minimal Assistance - Patient > 75% Sit to Supine: Moderate Assistance - Patient 50-74%   Discharge Criteria: Patient will be discharged from OT if patient refuses treatment 3 consecutive times without medical reason, if treatment goals not met, if there is a change in medical status, if patient makes no progress towards goals or if patient is discharged from hospital.  The above assessment, treatment plan, treatment alternatives and goals were discussed and mutually agreed upon: by patient  Melvyn Novas, MS, OTR/L  11/03/2023, 11:46 AM

## 2023-11-03 NOTE — Progress Notes (Addendum)
 Patient ID: [name], female   DOB: 04-05-1951, 73 y.o.   MRN: [id number]   1319- SW spoke with pt sister [name] [id number]) and pt sister [name] present as well. SW provided updates from team meeting, and informed on d/c date 3/24. Fam edu scheduled for Tues 10am-12pm. Pt sister [name] lives across the street; siblings will go to her home to support.  *SW met with pt and pt sister [name] in room to discuss above.   Cecile Sheerer, MSW, LCSW Office: [phone] Cell: [phone] Fax: [phone]

## 2023-11-03 NOTE — Evaluation (Signed)
 Physical Therapy Assessment and Plan  Patient Details  Name: [name] MRN: [id number] Date of Birth: 05/28/1951  PT Diagnosis: Abnormal posture, Abnormality of gait, Coordination disorder, Difficulty walking, Dizziness and giddiness, Edema, Hemiparesis dominant, Impaired sensation, Muscle weakness, and Pain in R posterior head Rehab Potential: Good ELOS: 12 days   Today's Date: 11/03/2023 PT Individual Time: 0815-0910 PT Individual Time Calculation (min): 55 min  Today's Date: 11/03/2023 PT Missed Time: 20 Minutes Missed Time Reason: Patient ill (Comment) (nausea)   Hospital Problem: Principal Problem:   Right pontine stroke [facility])   Past Medical History:  Past Medical History:  Diagnosis Date   Arthritis    "all over my body" (03/13/2013)   Asthma    Asymptomatic carotid artery stenosis, bilateral 10/08/2018   Breast cancer (HCC)    GERD (gastroesophageal reflux disease)    H/O hiatal hernia    Headache    pt states she has had headaches for about 6 months "off and on"   Heart murmur    pt had echocardiogram on 09/17/21   Hypercholesterolemia 10/08/2018   Hypertension    Hypothyroidism    Myasthenia gravis (HCC)    "in my eyes; diagnsosed > 7 yr ago" (03/13/2013)   Sleep apnea    on CPAP   Stroke (HCC) 2021   Thyroid carcinoma (HCC)    Type II diabetes mellitus (HCC)    Past Surgical History:  Past Surgical History:  Procedure Laterality Date   ABDOMINAL HYSTERECTOMY     ANTERIOR CERVICAL DECOMP/DISCECTOMY FUSION     "I've had severa ORs; always went in from the front" (03/13/2013)   APPENDECTOMY     BREAST LUMPECTOMY WITH RADIOACTIVE SEED AND SENTINEL LYMPH NODE BIOPSY Left 09/02/2022   Procedure: LEFT BREAST LUMPECTOMY WITH RADIOACTIVE SEED AND SENTINEL LYMPH NODE BIOPSY;  Surgeon: Abigail Miyamoto, MD;  Location: [facility] OR;  Service: General;  Laterality: Left;   CARDIAC CATHETERIZATION     "several" (03/13/2013)   CARPAL TUNNEL RELEASE Right    CATARACT  EXTRACTION W/ INTRAOCULAR LENS  IMPLANT, BILATERAL Bilateral    CHOLECYSTECTOMY     KNEE ARTHROSCOPY Left    SHOULDER ARTHROSCOPY W/ ROTATOR CUFF REPAIR Left    TONSILLECTOMY     TOTAL THYROIDECTOMY     TRANSCAROTID ARTERY REVASCULARIZATION  Left 09/24/2021   Procedure: LEFT TRANSCAROTID ARTERY REVASCULARIZATION;  Surgeon: Leonie Douglas, MD;  Location: [facility] OR;  Service: Vascular;  Laterality: Left;   TRANSCAROTID ARTERY REVASCULARIZATION  Right 10/23/2021   Procedure: Right Transcarotid Artery Revascularization;  Surgeon: Leonie Douglas, MD;  Location: [facility] OR;  Service: Vascular;  Laterality: Right;   ULTRASOUND GUIDANCE FOR VASCULAR ACCESS Right 09/24/2021   Procedure: ULTRASOUND GUIDANCE FOR VASCULAR ACCESS;  Surgeon: Leonie Douglas, MD;  Location: [facility] OR;  Service: Vascular;  Laterality: Right;   ULTRASOUND GUIDANCE FOR VASCULAR ACCESS Right 10/23/2021   Procedure: ULTRASOUND GUIDANCE FOR VASCULAR ACCESS, LEFT FEMORAL VEIN;  Surgeon: Leonie Douglas, MD;  Location: [facility] OR;  Service: Vascular;  Laterality: Right;    Assessment & Plan Clinical Impression: Patient is a 73 y.o. year old female who presented to the ED [facility] on 10/29/2023 with right facial, upper and lower extremity numbness. Reported new onset of dizziness for approximately one week prior to presentation at ED. [facility]ist telemedicine consult obtained.  Initial workup revealed CT head with aspect score of 10.  Emergent vessel imaging not performed due to anaphylaxis to iodinated contrast.  Per Dr. Wilford Corner, her  exam was not consistent with LVO.  He recommended transfer to [facility] for MRI.  She was evaluated by Dr. Amada Jupiter and her MRI was positive for 5 mm acute infarct within the dorsal pons on the right.  MRA with suspected flow-limiting stenosis in the left proximal ICA.Marland Kitchen  She was loaded with Plavix 300 mg and started on aspirin 81 mg.  She also underwent MRA of head and neck.  2D echo with ejection  fraction of 60 to 65%, LDL 41, hemoglobin A1c 9.2%.  She was started on Lovenox for VTE prophylaxis.  She will continue on DAPT for 3 weeks followed by Plavix alone.   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  She is being treated with tamoxifen.  She has a history of ocular myasthenia gravis and a history of thyroid cancer status post radiation and total thyroidectomy.  She has an insulin pump which will be reinitiated on admission to CIR.   She is requiring grossly min assist for bed mobility, transfers and gait with rolling walker for support.  Assist needed to manage right lower extremity in bed and during gait due to right lower extremity weakness.  Mild right inattention.  Rating carb modified diet with thin liquids. The patient requires inpatient medicine and rehabilitation evaluations and services for ongoing dysfunction secondary to right pontine infarct.  Patient currently requires mod with mobility secondary to muscle weakness, decreased cardiorespiratoy endurance, impaired timing and sequencing, abnormal tone, unbalanced muscle activation, and decreased coordination, decreased visual acuity, and decreased standing balance, decreased postural control, hemiplegia, and decreased balance strategies.  Prior to hospitalization, patient was modified independent  with mobility and lived with Alone in a House home.  Home access is 2Stairs to enter.  Patient will benefit from skilled PT intervention to maximize safe functional mobility, minimize fall risk, and decrease caregiver burden for planned discharge home with intermittent assist.  Anticipate patient will benefit from follow up [facility] at discharge.  PT - End of Session Activity Tolerance: Tolerates 30+ min activity with multiple rests Endurance Deficit:  Yes Endurance Deficit Description: limited by lightheadedness, nausea, and lethargy PT Assessment Rehab Potential (ACUTE/IP ONLY): Good PT Barriers to Discharge: Inaccessible home environment;Decreased caregiver support;Home environment access/layout PT Barriers to Discharge Comments: 2 STE with no rails, lives alone, pain, nausea, and lethargy PT Patient demonstrates impairments in the following area(s): Balance;Endurance;Motor;Pain;Sensory;Skin Integrity PT Transfers Functional Problem(s): Bed Mobility;Bed to Chair;Car;Furniture PT Locomotion Functional Problem(s): Ambulation;Wheelchair Mobility;Stairs PT Plan PT Intensity: Minimum of 1-2 x/day ,45 to 90 minutes PT Frequency: 5 out of 7 days PT Duration Estimated Length of Stay: 12 days PT Treatment/Interventions: Ambulation/gait training;Discharge planning;Functional mobility training;Psychosocial support;Therapeutic Activities;Visual/perceptual remediation/compensation;Balance/vestibular training;Disease management/prevention;Neuromuscular re-education;Skin care/wound management;Therapeutic Exercise;Wheelchair propulsion/positioning;DME/adaptive equipment instruction;Pain management;Splinting/orthotics;UE/LE Strength taining/ROM;Community reintegration;Patient/family education;Stair training;UE/LE Coordination activities PT Transfers Anticipated Outcome(s): Mod I with LRAD PT Locomotion Anticipated Outcome(s): Mod I with LRAD PT Recommendation Follow Up Recommendations: Home health PT Patient destination: Home Equipment Recommended: To be determined Equipment Details: has RW   PT Evaluation Precautions/Restrictions Precautions Precautions: Fall Precaution/Restrictions Comments: R hemiparesis Restrictions Weight Bearing Restrictions Per Provider Order: No Pain Interference Pain Interference Pain Effect on Sleep: 1. Rarely or not at all Pain Interference with Therapy Activities: 1. Rarely or not at all Pain Interference with  Day-to-Day Activities: 1. Rarely or not at all Home Living/Prior Functioning Home Living Available Help at Discharge:  Family;Available PRN/intermittently (has 1 sister that lives down the street, but has intermittent assist from other siblings) Type of Home: House Home Access: Stairs to enter [employer] of Steps: 2 Entrance Stairs-Rails: None Home Layout: One level Bathroom Shower/Tub: Engineer, manufacturing systems: Standard Bathroom Accessibility: Yes Additional Comments: pt has tub transfer bench and RW  Lives With: Alone Prior Function Level of Independence: Requires assistive device for independence;Independent with basic ADLs  Able to Take Stairs?: Yes Driving: No Vision/Perception  Vision - History Ability to See in Adequate Light: 1 Impaired Vision - Assessment Eye Alignment: Within Functional Limits Additional Comments: pt reports having floaters, vision in better in R eye Perception Perception: Within Functional Limits Praxis Praxis: WFL  Cognition Overall Cognitive Status: Within Functional Limits for tasks assessed Arousal/Alertness: Awake/alert Orientation Level: Oriented X4 Memory: Appears intact Awareness: Appears intact Problem Solving: Appears intact Safety/Judgment: Appears intact Sensation Sensation Light Touch: Impaired Detail Light Touch Impaired Details: Impaired RLE;Impaired RUE Hot/Cold: Not tested Proprioception: Impaired by gross assessment Stereognosis: Not tested Additional Comments: decreased sensation along RLE, worsening at distal aspect of leg. Pt also reports hypersensitivity along RLE with light touch and "tightness" in upper RUE. Coordination Gross Motor Movements are Fluid and Coordinated: No Fine Motor Movements are Fluid and Coordinated: No Coordination and Movement Description: grossly uncoordinated due to R hemiparesis, visual impairments, and decreased standing balance/coordination Finger Nose Finger Test: WFL on  LUE, slower on RUE. Heel Shin Test: unable to perform on RLE, slow and decresaed ROM on LLE Motor  Motor Motor: Hemiplegia Motor - Skilled Clinical Observations: R hemiparesis, visual impairments, and decreased standing balance/coordination  Trunk/Postural Assessment  Cervical Assessment Cervical Assessment: Exceptions to [facility] (forward head) Thoracic Assessment Thoracic Assessment: Exceptions to [facility] (rounded shoulders) Lumbar Assessment Lumbar Assessment: Exceptions to [facility] (posterior pelvic tilt) Postural Control Postural Control: Deficits on evaluation Righting Reactions: anticipate delayed on R in standing Protective Responses: anticipate delayed on R in standing  Balance Balance Balance Assessed: Yes Static Sitting Balance Static Sitting - Balance Support: Feet supported;Bilateral upper extremity supported Static Sitting - Level of Assistance: 5: Stand by assistance (supervision) Dynamic Sitting Balance Dynamic Sitting - Balance Support: Feet supported;No upper extremity supported Dynamic Sitting - Level of Assistance: 5: Stand by assistance (supervision) Extremity Assessment  RLE Assessment RLE Assessment: Exceptions to [facility] General Strength Comments: tested sitting EOB RLE Strength Right Hip Flexion: 2/5 Right Hip ABduction: 3-/5 Right Hip ADduction: 3-/5 Right Knee Flexion: 1/5 Right Knee Extension: 1/5 Right Ankle Dorsiflexion: 2/5 Right Ankle Plantar Flexion: 2/5 LLE Assessment LLE Assessment: Exceptions to [facility] General Strength Comments: tested sitting EOB LLE Strength Left Hip Flexion: 3+/5 Left Hip ABduction: 3+/5 Left Hip ADduction: 3+/5 Left Knee Flexion: 4-/5 Left Knee Extension: 4-/5 Left Ankle Dorsiflexion: 4-/5 Left Ankle Plantar Flexion: 4/5  Care Tool Care Tool Bed Mobility Roll left and right activity   Roll left and right assist level: Minimal Assistance - Patient > 75%    Sit to lying activity   Sit to lying assist level: Moderate Assistance  - Patient 50 - 74%    Lying to sitting on side of bed activity   Lying to sitting on side of bed assist level: the ability to move from lying on the back to sitting on the side of the bed with no back support.: Minimal Assistance - Patient > 75%     Care Tool Transfers Sit to stand transfer Sit to stand activity did not occur: Safety/medical concerns (nausea, lethargy)  Chair/bed transfer Chair/bed transfer activity did not occur: Safety/medical concerns (nausea, lethargy)      Car transfer Car transfer activity did not occur: Safety/medical concerns (nausea, lethargy)        Care Tool Locomotion Ambulation Ambulation activity did not occur: Safety/medical concerns (nausea, lethargy)        Walk 10 feet activity Walk 10 feet activity did not occur: Safety/medical concerns (nausea, lethargy)       Walk 50 feet with 2 turns activity Walk 50 feet with 2 turns activity did not occur: Safety/medical concerns (nausea, lethargy)      Walk 150 feet activity Walk 150 feet activity did not occur: Safety/medical concerns (nausea, lethargy)      Walk 10 feet on uneven surfaces activity Walk 10 feet on uneven surfaces activity did not occur: Safety/medical concerns (nausea, lethargy)      Stairs Stair activity did not occur: Safety/medical concerns (nausea, lethargy)        Walk up/down 1 step activity Walk up/down 1 step or curb (drop down) activity did not occur: Safety/medical concerns (nausea, lethargy)      Walk up/down 4 steps activity Walk up/down 4 steps activity did not occur: Safety/medical concerns (nausea, lethargy)      Walk up/down 12 steps activity Walk up/down 12 steps activity did not occur: Safety/medical concerns (nausea, lethargy)      Pick up small objects from floor Pick up small object from the floor (from standing position) activity did not occur: Safety/medical concerns (nausea, lethargy)      Wheelchair Is the patient using a wheelchair?: Yes Type  of Wheelchair: Manual Wheelchair activity did not occur: Safety/medical concerns (nausea, lethargy)      Wheel 50 feet with 2 turns activity Wheelchair 50 feet with 2 turns activity did not occur: Safety/medical concerns (nausea, lethargy)    Wheel 150 feet activity Wheelchair 150 feet activity did not occur: Safety/medical concerns (nausea, lethargy)      Refer to Care Plan for Long Term Goals  SHORT TERM GOAL WEEK 1 PT Short Term Goal 1 (Week 1): pt will perform bed mobility with CGA overall PT Short Term Goal 2 (Week 1): pt will perform transfers with LRAD and CGA PT Short Term Goal 3 (Week 1): pt will ambulate 109ft with LRAD and CGA  Recommendations for other services: None   Skilled Therapeutic Intervention Evaluation completed (see details above and below) with education on PT POC and goals and individual treatment initiated with focus on bed mobility, functional mobility/transfers, and generalized strengthening and endurance. Received pt semi-reclined in bed, pt educated on PT evaluation, CIR policies, and therapy schedule and agreeable. Pt reported pain 6/10 in posterior R head (premedicated). Pt lethargic from pain and nausea medication and per RN, pt with active emesis episodes this morning and not feeling well.   Provided pt with 18x18 manual WC and pt agreed to attempt to participate. Pt transferred semi-reclined<>sitting R EOB with HOB slightly elevated and use of bedrails with min A for RLE management. Pt required increased time to scoot to EOB, then reported feeling lightheaded with increased nausea. Provided pt with emesis bag and BP: 169/82. Pt politely declined any standing due to symptoms - transitioned into supine with mod A for BLE management and scooted to HOB pulling on headboard with assist using chuck pads. MD arrived for morning rounds and BP in supine: 162/78. RN requesting bed weight 87.9kg. Concluded session with pt semi-reclined in bed, needs within reach, and bed  alarm on.  Safety plan updated. 20 minutes missed of skilled physical therapy due to nausea/fatigue.   Mobility Bed Mobility Bed Mobility: Rolling Right;Rolling Left;Sit to Supine;Supine to Sit Rolling Right: Minimal Assistance - Patient > 75% Rolling Left: Minimal Assistance - Patient > 75% Supine to Sit: Minimal Assistance - Patient > 75% Sit to Supine: Moderate Assistance - Patient 50-74% Locomotion  Gait Ambulation: No (nausea, lethargy) Gait Gait: No (nausea, lethargy) Stairs / Additional Locomotion Stairs: No (nausea, lethargy) Wheelchair Mobility Wheelchair Mobility: No (nausea, lethargy)   Discharge Criteria: Patient will be discharged from PT if patient refuses treatment 3 consecutive times without medical reason, if treatment goals not met, if there is a change in medical status, if patient makes no progress towards goals or if patient is discharged from hospital.  The above assessment, treatment plan, treatment alternatives and goals were discussed and mutually agreed upon: by patient  Huntley Dec PT, DPT 11/03/2023, 11:40 AM

## 2023-11-03 NOTE — Progress Notes (Signed)
 PROGRESS NOTE   Subjective/Complaints: C/o right upper extremity tightness: Vas [place] ordered Right sided weakness  Labs reviewed  ROS: +right upper extremity tightness  Objective:   No results found. Recent Labs    11/03/23 0514  WBC 6.0  HGB 10.9*  HCT 33.2*  PLT 221   Recent Labs    11/01/23 0943 11/03/23 0514  NA 138 139  K 4.0 3.6  CL 106 105  CO2 24 25  GLUCOSE 296* 86  BUN 16 18  CREATININE 1.08* 1.06*  CALCIUM 9.2 9.2    Intake/Output Summary (Last 24 hours) at 11/03/2023 1139 Last data filed at 11/03/2023 1610 Gross per 24 hour  Intake 360 ml  Output 1 ml  Net 359 ml        Physical Exam: Vital Signs Blood pressure (!) 150/77, pulse 78, temperature 97.9 F (36.6 C), temperature source Oral, resp. rate 18, SpO2 98%. .Gen: no distress, normal appearing HENT:     Head: Normocephalic and atraumatic.     Comments: Decreased to light touch from nose downwards- R side    Nose: Nose normal. No congestion.     Mouth/Throat:     Mouth: Mucous membranes are dry.     Pharynx: Oropharynx is clear. No oropharyngeal exudate.  Eyes:     General:        Right eye: No discharge.        Left eye: No discharge.     Comments: Dysconjugate gaze L upper visual field cut- is chronic per pt No nystagmus  Cardiovascular:     Rate and Rhythm: Normal rate and regular rhythm.     Heart sounds: Normal heart sounds. No murmur heard.    No gallop.     Comments: Rate in 90's Pulmonary:     Effort: Pulmonary effort is normal. No respiratory distress.     Breath sounds: Normal breath sounds. No wheezing, rhonchi or rales.     Comments: Cough with deep breaths Abdominal:     General: Bowel sounds are normal. There is no distension.     Palpations: Abdomen is soft.     Tenderness: There is no abdominal tenderness.  Musculoskeletal:        General: Normal range of motion.     Cervical back: Neck supple. No  tenderness.     Comments:  RUE- biceps 2+/5; Triceps 2/5; WE 3-/5; Grip 2/5; FA 1/5 LUE- 5/5 except FA 4/5 RLE- HF 1/5; KE 2-/5 DF/PF 0/5;  LLE-5-/5, stable 3/12 Skin:    General: Skin is warm and dry.     Comments: LUE- swelling- mild in hand- not elevated Port R chest- not accessed  Neurological:     Mental Status: She is alert and oriented to person, place, and time.     Comments: Decreased on L side throughout   Psychiatric:        Mood and Affect: Mood normal.        Behavior: Behavior normal.   Assessment/Plan: 1. Functional deficits which require 3+ hours per day of interdisciplinary therapy in a comprehensive inpatient rehab setting. Physiatrist is providing close team supervision and 24 hour management of active medical problems  listed below. Physiatrist and rehab team continue to assess barriers to discharge/monitor patient progress toward functional and medical goals  Care Tool:  Bathing    Body parts bathed by patient: Right arm, Left arm, Chest, Abdomen, Right upper leg, Left upper leg, Face, Right lower leg, Left lower leg   Body parts bathed by helper: Front perineal area, Buttocks     Bathing assist Assist Level: Moderate Assistance - Patient 50 - 74%     Upper Body Dressing/Undressing Upper body dressing   What is the patient wearing?: Pull over shirt    Upper body assist Assist Level: Moderate Assistance - Patient 50 - 74%    Lower Body Dressing/Undressing Lower body dressing      What is the patient wearing?: Pants     Lower body assist Assist for lower body dressing: Moderate Assistance - Patient 50 - 74%     Toileting Toileting    Toileting assist Assist for toileting: Moderate Assistance - Patient 50 - 74%     Transfers Chair/bed transfer  Transfers assist  Chair/bed transfer activity did not occur: Safety/medical concerns (nausea, lethargy)        Locomotion Ambulation   Ambulation assist   Ambulation activity did not  occur: Safety/medical concerns (nausea, lethargy)          Walk 10 feet activity   Assist  Walk 10 feet activity did not occur: Safety/medical concerns (nausea, lethargy)        Walk 50 feet activity   Assist Walk 50 feet with 2 turns activity did not occur: Safety/medical concerns (nausea, lethargy)         Walk 150 feet activity   Assist Walk 150 feet activity did not occur: Safety/medical concerns (nausea, lethargy)         Walk 10 feet on uneven surface  activity   Assist Walk 10 feet on uneven surfaces activity did not occur: Safety/medical concerns (nausea, lethargy)         Wheelchair     Assist Is the patient using a wheelchair?: Yes Type of Wheelchair: Manual Wheelchair activity did not occur: Safety/medical concerns (nausea, lethargy)         Wheelchair 50 feet with 2 turns activity    Assist    Wheelchair 50 feet with 2 turns activity did not occur: Safety/medical concerns (nausea, lethargy)       Wheelchair 150 feet activity     Assist  Wheelchair 150 feet activity did not occur: Safety/medical concerns (nausea, lethargy)       Blood pressure (!) 150/77, pulse 78, temperature 97.9 F (36.6 C), temperature source Oral, resp. rate 18, SpO2 98%. Medical Problem List and Plan: 1. Functional deficits secondary to R pontine CVA             -patient may  shower= as long as port not accessed             -ELOS/Goals: 7-10 days min A to supervision             Admit to CIR 2.  Antithrombotics: -DVT/anticoagulation:  Pharmaceutical: Lovenox             -antiplatelet therapy: Aspirin and Plavix for three weeks followed by Plavix alone (has ICA stent)   3. Pain Management: Tylenol as needed             -Lyrica 75 mg BID   4. Mood/Behavior/Sleep: LCSW to evaluate and provide emotional support             -  antipsychotic agents: n/a   5. Neuropsych/cognition: This patient is capable of making decisions on her own behalf.    6. Skin/Wound Care: Routine skin care checks   7. Fluids/Electrolytes/Nutrition: Routine Is and Os and follow-up chemistries             -vitamin D weekly             -vitamin B12 daily   8: Hypertension: monitor TID and prn   9: Hyperlipidemia: continue statin, Zetia, Repatha   10: DM: A1c = 9.2%             Continue insulin pump, discussed no more than 11U bolus to prevent hypoglycemia             -continue Jardiance 25 mg daily             -continue SSI   11: 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 at home) - likely need to increase to 100 mg QHS_ having HA's 5-6/10 daily now   14: OSA on CPAP   15: History of breast cancer: continue tamoxifen   16: CKD stage II: Serum creatinine at or near baseline -follow-up BMP   17: Chronic anemia: follow-up CBC   18: Leukopenia: follow-up CBC   19. Nausea: 2/2 constipation: sorbitol ordered  20. Right upper extremity pain: VAS [place] ordered to assess for clot    LOS: 1 days A FACE TO FACE EVALUATION WAS PERFORMED  Drema Pry Miqueas Whilden 11/03/2023, 11:39 AM

## 2023-11-03 NOTE — Progress Notes (Signed)
 Occupational Therapy Session Note  Patient Details  Name: [name] MRN: [id number] Date of Birth: Mar 11, 1951  Today's Date: 11/03/2023 OT Individual Time: 1420-1520 OT Individual Time Calculation (min): 60 min  and Today's Date: 11/03/2023 OT Missed Time: 15 Minutes Missed Time Reason: Patient ill (comment) (nausea)   Short Term Goals: Week 1:  OT Short Term Goal 1 (Week 1): Pt will complete sit > stand with CGA OT Short Term Goal 2 (Week 1): Pt will complete toilet transfer with CGA using LRAD OT Short Term Goal 3 (Week 1): Pt will donn LB clothing supervision using AE PRN  Skilled Therapeutic Interventions/Progress Updates:  Skilled OT intervention completed with focus on BP assessment, activity tolerance, functional transfers, toileting. Pt received upright in bed, agreeable to session. Headache reported; nurse notified of pain med request and also for nausea symptoms that progressed with mobility during session. OT offered rest breaks and repositioning throughout for pain reduction.  Pt transitioned to EOB with min A for RLE only. Nurse present for meds. Pt with continued dizziness, therefore assessed BP as seen below. MD notified of status.  Completed CGA sit > stand with cues for hand placement on RW, then min A stand pivot with RW > w/c with cues for backwards stepping. Stood again for BP with CGA using RW, and required CGA for static standing balance. Pt verbalized urge to void. Transported in w/c > bathroom. Completed CGA sit > stand and stand pivot using grab bar > BSC over toilet. Mod A to lower clothing. Continent of urinary void only. Distant supervision for front pericare. CGA sit > stand and min A for redonning of pants. CGA stand pivot with grab bar > w/c.   Set up A for hand hygiene at sink, however noted delayed RUE activation, as similarly noted on RLE with transfers. Min A sit > stand with RW, then ambulated about 10 ft using RW > EOB with slow processing though no  assist to advance RLE and without buckling. Min A for bed mobility sit > supine. Pt remained upright in bed, with bed alarm on/activated, and with all needs in reach at end of session. Pt missed 15 mins of OT intervention secondary to nausea; OT will make up missed time as able.  Vitals (all symptomatic with dizziness and progressing nausea) BP 183/88 (sitting EOB) BP 156/70 (sitting; after transfer > w/c) BP 118/64 (in stance) BP 180/81 (sitting- after toileting)   Therapy Documentation Precautions:  Precautions Precautions: Fall Precaution/Restrictions Comments: R hemiparesis Restrictions Weight Bearing Restrictions Per Provider Order: No    Therapy/Group: Individual Therapy  Melvyn Novas, MS, OTR/L  11/03/2023, 3:32 PM

## 2023-11-03 NOTE — Care Management (Signed)
 Inpatient Rehabilitation Center Individual Statement of Services  Patient Name:  JAYCIE KREGEL  Date:  11/03/2023  Welcome to the Inpatient Rehabilitation Center.  Our goal is to provide you with an individualized program based on your diagnosis and situation, designed to meet your specific needs.  With this comprehensive rehabilitation program, you will be expected to participate in at least 3 hours of rehabilitation therapies Monday-Friday, with modified therapy programming on the weekends.  Your rehabilitation program will include the following services:  Physical Therapy (PT), Occupational Therapy (OT), 24 hour per day rehabilitation nursing, Therapeutic Recreaction (TR), Psychology, Neuropsychology, Care Coordinator, Rehabilitation Medicine, Nutrition Services, Pharmacy Services, and Other  Weekly team conferences will be held on Wednesdays to discuss your progress.  Your Inpatient Rehabilitation Care Coordinator will talk with you frequently to get your input and to update you on team discussions.  Team conferences with you and your family in attendance may also be held.  Expected length of stay: 10-12 days  Overall anticipated outcome: Modified Independent  Depending on your progress and recovery, your program may change. Your Inpatient Rehabilitation Care Coordinator will coordinate services and will keep you informed of any changes. Your Inpatient Rehabilitation Care Coordinator's name and contact numbers are listed  below.  The following services may also be recommended but are not provided by the Inpatient Rehabilitation Center:  Driving Evaluations Home Health Rehabiltiation Services Outpatient Rehabilitation Services Vocational Rehabilitation   Arrangements will be made to provide these services after discharge if needed.  Arrangements include referral to agencies that provide these services.  Your insurance has been verified to be:  [facility] Medicare  Your primary doctor is:   Irena Reichmann  Pertinent information will be shared with your doctor and your insurance company.  Inpatient Rehabilitation Care Coordinator:  Susie Cassette [phone] or [phone]  Information discussed with and copy given to patient by: Gretchen Short, 11/03/2023, 9:44 PM

## 2023-11-04 ENCOUNTER — Inpatient Hospital Stay (HOSPITAL_COMMUNITY)

## 2023-11-04 DIAGNOSIS — I6350 Cerebral infarction due to unspecified occlusion or stenosis of unspecified cerebral artery: Secondary | ICD-10-CM | POA: Diagnosis not present

## 2023-11-04 LAB — GLUCOSE, CAPILLARY: Glucose-Capillary: 210 mg/dL — ABNORMAL HIGH (ref 70–99)

## 2023-11-04 MED ORDER — BISACODYL 10 MG RE SUPP
10.0000 mg | Freq: Once | RECTAL | Status: AC
  Administered 2023-11-04: 10 mg via RECTAL
  Filled 2023-11-04: qty 1

## 2023-11-04 MED ORDER — SODIUM CHLORIDE 0.9% FLUSH
10.0000 mL | Freq: Two times a day (BID) | INTRAVENOUS | Status: DC
Start: 2023-11-04 — End: 2023-11-18
  Administered 2023-11-04 – 2023-11-18 (×16): 10 mL

## 2023-11-04 MED ORDER — SODIUM CHLORIDE 0.9% FLUSH: 10.0000 mL | INTRAVENOUS | Status: DC | PRN

## 2023-11-04 MED ORDER — LIDOCAINE HCL URETHRAL/MUCOSAL 2 % EX GEL
1.0000 | Freq: Once | CUTANEOUS | Status: DC
  Filled 2023-11-04: qty 6

## 2023-11-04 MED ORDER — CHLORHEXIDINE GLUCONATE CLOTH 2 % EX PADS
6.0000 | MEDICATED_PAD | Freq: Every day | CUTANEOUS | Status: DC
Start: 2023-11-05 — End: 2023-11-06
  Administered 2023-11-05: 6 via TOPICAL

## 2023-11-04 MED ORDER — MAGNESIUM CITRATE PO SOLN
1.0000 | Freq: Once | ORAL | Status: DC
  Filled 2023-11-04: qty 296

## 2023-11-04 NOTE — Progress Notes (Signed)
 Spoke with Herma Mering RN, on coming nurse.  She will assess patient. Previously nurse reports patient had vomited this morning, no vomiting at this time.  Her last set of vitals Heart rate 114 and blood pressure 165/87. Will await [place] assessment and vitals, if she remains tachycardia, order given for IV fluid bolus NS 500 cc.

## 2023-11-04 NOTE — Progress Notes (Signed)
 Physical Therapy Session Note  Patient Details  Name: [name] MRN: [id number] Date of Birth: 12-28-1950  Today's Date: 11/04/2023 PT Individual Time: 4098-1191 PT Individual Time Calculation (min): 68 min  Today's Date: 11/04/2023 PT Missed Time: 7 Minutes Missed Time Reason: Toileting  Short Term Goals: Week 1:  PT Short Term Goal 1 (Week 1): pt will perform bed mobility with CGA overall PT Short Term Goal 2 (Week 1): pt will perform transfers with LRAD and CGA PT Short Term Goal 3 (Week 1): pt will ambulate 43ft with LRAD and CGA  Skilled Therapeutic Interventions/Progress Updates:   Received pt semi-reclined in bed with RN present to administer medication. Pt agreeable to PT treatment and reported feeling "rough" with pain 7/10 in R posterior head and mild nausea (better than yesterday). Session with emphasis on functional mobility/transfers, dressing, generalized strengthening and endurance, dynamic standing balance/coordination, and gait training. Pt transferred semi-reclined<>sitting R EOB with HOB elevated and use of bedrails with min A for RLE management. Pt reporting mild lightheadedness and nausea while sitting EOB.  Pt requesting to get dressed - removed scrub top with min A and pt donned bra and pull over shirt with min A for RUE management. Stood from EOB with RW and CGA/min A x 3 trials and removed scrub pants with min A, donned clean pants seated with max A to thread BLE through, and pulled pants over hips in standing with CGA for balance. Transferred into WC via stand<>pivot with RW and CGA and sat in WC at sink and washed face with set up assist.    Pt transported to/from room in [facility] dependently for time management purposes. Stood with RW and CGA and ambulated 12ft with RW and CGA/light min A with WC follow (per pt request). Pt with increased R foot drag and limited by fatigue. Attempted RLE toe taps to 6in step but unable to perform due to hip flexor weakness - switched  out for 3in step and pt performed 2x6 RLE toe taps with BUE support on RW and CGA for balance with emphasis on hip flexor strength.  Pt then performed 1x5 sit<>stands without UE support from elevated EOM with CGA - emphasis on quad strength. Pt reporting 9/10 fatigue with activity.  Pt reported urge to toilet - transferred into WC via stand<>pivot with RW and CGA and returned to room. Stood from [facility] with RW and CGA and ambulated in/out of bathroom with RW and CGA - cues to increase R foot clearance. Pt able to manage clothing with CGA for balance. Pt able to void and requested time for BM. Pt left seated on toilet in care of RN for toileting. 7 minutes missed of skilled physical therapy due to toileting.   Vitals: BP seated in WC after dressing: 155/73  Therapy Documentation Precautions:  Precautions Precautions: Fall Precaution/Restrictions Comments: R hemiparesis Restrictions Weight Bearing Restrictions Per Provider Order: No  Therapy/Group: Individual Therapy Marlana Salvage Zaunegger Blima Rich PT, DPT 11/04/2023, 6:54 AM

## 2023-11-04 NOTE — Plan of Care (Signed)
  Problem: Consults Goal: RH STROKE PATIENT EDUCATION Description: See Patient Education module for education specifics  Outcome: Progressing   Problem: RH BOWEL ELIMINATION Goal: RH STG MANAGE BOWEL WITH ASSISTANCE Description: STG Manage Bowel with toileting Assistance. Outcome: Progressing   Problem: RH BOWEL ELIMINATION Goal: RH STG MANAGE BOWEL W/MEDICATION W/ASSISTANCE Description: STG Manage Bowel with Medication with mod I Assistance. Outcome: Progressing   Problem: RH SAFETY Goal: RH STG ADHERE TO SAFETY PRECAUTIONS W/ASSISTANCE/DEVICE Description: STG Adhere to Safety Precautions With cues Assistance/Device. Outcome: Progressing   Problem: RH PAIN MANAGEMENT Goal: RH STG PAIN MANAGED AT OR BELOW PT'S PAIN GOAL Description: Pain < 4 with prns Outcome: Progressing   Problem: RH KNOWLEDGE DEFICIT Goal: RH STG INCREASE KNOWLEDGE OF DIABETES Description: Patient and sister will be able to manage DM using educational resources for medications and dietary modification independently Outcome: Progressing   Problem: RH KNOWLEDGE DEFICIT Goal: RH STG INCREASE KNOWLEDGE OF STROKE PROPHYLAXIS Description: Patient and sister will be able to manage secondary risks using educational resources for medications and dietary modification independently Outcome: Progressing

## 2023-11-04 NOTE — Progress Notes (Addendum)
 Continues to complain of constipation, mid-lower abd pain, nausea and intermittent vomiting.  Did not fully evacuate with Fleets enema. On exam, her abdomen is soft  and pliable. No tenderness to deep palpation. No peritoneal signs. Non-distended, hypoactive BS.  DRE: rectal vault full with hard and soft non-bloody stool.  RN attempting to disimpact and will administer soap suds enema.

## 2023-11-04 NOTE — Discharge Summary (Signed)
 Physician Discharge Summary  Patient ID: [name] MRN: [id number] DOB/AGE: May 04, 1951 73 y.o.  Admit date: 11/02/2023 Discharge date: 11/18/2023  Discharge Diagnoses:  Principal Problem:   Right pontine stroke [facility]) Active problems: Functional deficits secondary to right pontine stroke DM Hypothyroidism Optic neuropathy Myasthenia gravis Migraine headache OSA Breast cancer CKD stage II Chronic anemia Leukopenia Dizziness Constipation  Nausea with vomiting  Discharged Condition: {condition:18240}  Significant Diagnostic Studies:  Narrative & Impression  CLINICAL DATA:  Facial paralysis, left facial droop.   EXAM: CT HEAD WITHOUT CONTRAST   TECHNIQUE: Contiguous axial images were obtained from the base of the skull through the vertex without intravenous contrast.   RADIATION DOSE REDUCTION: This exam was performed according to the departmental dose-optimization program which includes automated exposure control, adjustment of the mA and/or kV according to patient size and/or use of iterative reconstruction technique.   COMPARISON:  Three days prior   FINDINGS: Brain: Chronic lacunar infarcts in the bilateral thalamus and pons. No evidence of acute infarct, hemorrhage, hydrocephalus, mass, or collection.   Vascular: No hyperdense vessel or unexpected calcification.   Skull: Normal. Negative for fracture or focal lesion.   Sinuses/Orbits: No acute finding.   IMPRESSION: Known lacunar infarcts in the brainstem and thalami. No acute finding.     Electronically Signed   By: Tiburcio Pea M.D.   On: 11/08/2023 09:27      Narrative & Impression  CLINICAL DATA:  Nausea and vomiting.   EXAM: ABDOMEN - 1 VIEW   COMPARISON:  None Available.   FINDINGS: No bowel dilatation to suggest obstruction. Small to moderate formed stool in the colon. No radiopaque calculi. Right upper quadrant surgical clips, typically from cholecystectomy. No evidence of  free air in the supine view.   IMPRESSION: Nonobstructive bowel gas pattern.     Electronically Signed   By: Narda Rutherford M.D.   On: 11/04/2023 15:39     VAS [place] UPPER EXTREMITY VENOUS DUPLEX Result Date: 11/03/2023 UPPER VENOUS STUDY  Patient Name:  [name]  Date of Exam:   11/03/2023 Medical Rec #: [id number]        Accession #:    [id number] Date of Birth: 05/07/1951         Patient Gender: F Patient Age:   35 years Exam Location:  [facility] Procedure:      VAS [place] UPPER EXTREMITY VENOUS DUPLEX Referring Phys: Sula Soda --------------------------------------------------------------------------------  Indications: Swelling, and stroke Comparison Study: No priors. Performing Technologist: Westview Sink Sturdivant-Jones RDMS, RVT  Examination Guidelines: A complete evaluation includes B-mode imaging, spectral Doppler, color Doppler, and power Doppler as needed of all accessible portions of each vessel. Bilateral testing is considered an integral part of a complete examination. Limited examinations for reoccurring indications may be performed as noted.  Right Findings: +----------+------------+---------+-----------+----------+--------------+ RIGHT     CompressiblePhasicitySpontaneousProperties   Summary     +----------+------------+---------+-----------+----------+--------------+ IJV           Full       Yes       Yes                             +----------+------------+---------+-----------+----------+--------------+ Subclavian    Full       Yes       Yes                             +----------+------------+---------+-----------+----------+--------------+  Axillary      Full       Yes       Yes                             +----------+------------+---------+-----------+----------+--------------+ Brachial      Full                                                 +----------+------------+---------+-----------+----------+--------------+ Radial        Full                                                  +----------+------------+---------+-----------+----------+--------------+ Ulnar         Full                                                 +----------+------------+---------+-----------+----------+--------------+ Cephalic                                            Not visualized +----------+------------+---------+-----------+----------+--------------+ Basilic       Full                                                 +----------+------------+---------+-----------+----------+--------------+  Left Findings: +----------+------------+---------+-----------+----------+-------+ LEFT      CompressiblePhasicitySpontaneousPropertiesSummary +----------+------------+---------+-----------+----------+-------+ Subclavian               Yes       Yes                      +----------+------------+---------+-----------+----------+-------+  Summary:  Right: No evidence of deep vein thrombosis in the upper extremity.  Left: No evidence of thrombosis in the subclavian.  *See table(s) above for measurements and observations.  Diagnosing physician: Coral Else MD Electronically signed by Coral Else MD on 11/03/2023 at 8:42:51 PM.    Final       Labs:  Basic Metabolic Panel: Recent Labs  Lab 10/29/23 1155 10/30/23 0248 10/31/23 0622 11/01/23 0943 11/03/23 0514  NA 141 135 137 138 139  K 3.3* 4.6 4.1 4.0 3.6  CL 112* 110 109 106 105  CO2 21* 19* 22 24 25   GLUCOSE 196* 390* 263* 296* 86  BUN 18 20 17 16 18   CREATININE 0.89 1.15* 1.06* 1.08* 1.06*  CALCIUM 8.8* 8.2* 8.8* 9.2 9.2  MG  --   --  1.7  --   --   PHOS  --   --  3.4  --   --     CBC: Recent Labs  Lab 10/29/23 1155 10/30/23 0248 10/31/23 0622 11/03/23 0514  WBC 6.2 5.0 3.7* 6.0  NEUTROABS 4.8  --  2.1 3.5  HGB 10.8* 8.6* 9.7* 10.9*  HCT 34.4* 27.1* 29.9* 33.2*  MCV 93.5 96.4 93.4 91.7  PLT 166 147*  156 221    CBG: Recent Labs  Lab 11/01/23 2111  11/02/23 0846 11/02/23 1210 11/03/23 1138 11/03/23 2118  GLUCAP 308* 298* 332* 175* 217*    Brief HPI:   [name] is a 73 y.o. female who presented to the ED [facility] on 10/29/2023 with right facial, upper and lower extremity numbness. Reported new onset of dizziness for approximately one week prior to presentation at ED. [facility] neuro[facility]ist telemedicine consult obtained.  Initial workup revealed CT head with aspect score of 10.  Emergent vessel imaging not performed due to anaphylaxis to iodinated contrast.  Per Dr. Wilford Corner, her exam was not consistent with LVO.  He recommended transfer to [facility] for MRI.  She was evaluated by Dr. Amada Jupiter and her MRI was positive for 5 mm acute infarct within the dorsal pons on the right.  MRA with suspected flow-limiting stenosis in the left proximal ICA. She was loaded with Plavix 300 mg and started on aspirin 81 mg.  She also underwent MRA of head and neck.  2D echo with ejection fraction of 60 to 65%, LDL 41, hemoglobin A1c 9.2%.  She was started on Lovenox for VTE prophylaxis.  She will continue on DAPT for 3 weeks followed by Plavix alone.   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  She is being treated with tamoxifen.  She has a history of ocular myasthenia gravis and a history of thyroid cancer status post radiation and total thyroidectomy.  She has an insulin pump which will be reinitiated on admission to CIR.   She is requiring grossly min assist for bed mobility, transfers and gait with rolling walker for support.  Assist needed to manage right lower extremity in bed and during gait due to right lower extremity weakness.  Mild right inattention.  Tolerating carb modified diet with thin liquids   Hospital Course: [name] was admitted to rehab  11/02/2023 for inpatient therapies to consist of PT, ST and OT at least three hours five days a week. Past admission physiatrist, therapy team and rehab RN have worked together to provide customized collaborative inpatient rehab. Follow-up labs 3/12 stable and hgb improved. Complained of RUE tightness and venous duplex performed. Negative for DVT/other. Suppository given since no results with Sorbitol. Vomited morning meds. KUB obtained. Complaining of nausea/abdominal pain due to constipation. Enemas times two administered. Noted tachycardia and mild hypertension on 3/13 and given IVF bolus. Hospitalist consulted. D-dimer, UA and urine culture obtained. D-dimer 0.89. TSH 0.020. BLE venous duplex negative for DVT and VQ scan negative for PE. BMP significant for hypokalemia>>repleted. CT head without acute changes. Procalcitonin normal. Urine sample contaminated. Symptoms resolved after normalization of potassium and constipation. Records note Synthroid dose had been decreased from 200 mcg down to 100 mcg during her most recent hospitalization. Continue current dose of levothyroxine. Recommend rechecking TSH in 4 weeks given recent dose adjustment/following up with endocrinology. Respiratory panel obtained on 3/15 due to cough. Negative panel results. Patient reported new left facial droop and vertigo on morning of 3/17 and CT head obtained with no acute findings. Hgb improved to 9.1. Discontinued Lovenox 3/19 as patient ambulating >150 feet. SCDs ordered. Discussed potentially placing AFO for right foot drop.     Blood pressures were monitored on TID basis and resumed olmesartan 20 mg daily (irbesartan 150 mg daily per formulary) on  3/16. Added prn PO hydralazine prn SBP >160 in the setting of recent CVA. Decreased irbesartan to 75 mg daily on 3/17. Irbesartan discontinued on 3/18 due to hypotension and dizziness.  Asked nursing to request that dietician speak with patient to help adjust her HS snack on 3/21.     Diabetes has been monitored and diabetic coordinator consulted for insulin pod and CBG monitor. Discussed no more than 11 units bolus to prevent hypoglycemia. Started Jardiance 25 mg daily on 3/12. Diabetes coordinator continued to follow. Advised stopping insulin between dinner and bedtime to prevent 3am hypoglycemia   Rehab course: During patient's stay in rehab weekly team conferences were held to monitor patient's progress, set goals and discuss barriers to discharge. At admission, patient required min assist for bed mobility, transfers and gait with rolling walker for support. Mod with basic self-care skills   She  has had improvement in activity tolerance, balance, postural control as well as ability to compensate for deficits. She has had improvement in functional use RUE/LUE  and RLE/LLE as well as improvement in awareness       Disposition:  There are no questions and answers to display.         Diet:  Special Instructions:   Allergies as of 11/04/2023       Reactions   Contrast Media [iodinated Contrast Media] Anaphylaxis   01/10/15 and 09/18/2021 --PT GIVEN 13 HR PRE MEDS FOR CT with contrast TOLERATED IV CONTRAST W/O ANY REACTION   Fluorescein Shortness Of Breath, Other (See Comments)   (Dye)   Iodine Anaphylaxis   Contrast dye - iodine   Magnesium-containing Compounds Other (See Comments)   H/O myasthenia gravis- caution replacing IV    Molds & Smuts Shortness Of Breath, Other (See Comments)   Congestion and wheezing, also   Shellfish-derived Products Anaphylaxis, Shortness Of Breath, Swelling, Other (See Comments)   Welts, also   Pholcodine Hives   Azithromycin Nausea Only   Codeine Hives, Nausea Only   Metformin Hcl Er Nausea Only   Oxycodone Nausea Only   Nausea 08/2022 admission.   Tramadol Hcl Nausea Only   Other Rash, Other (See Comments)   Coban causes welts, also     Med Rec must be completed prior to using this SMART[employer]***       Follow-up  Information     Irena Reichmann, DO Follow up.   Specialty: Family Medicine Contact information: [address] [place] [address] [phone]         Horton Chin, MD Follow up.   Specialty: Physical Medicine and Rehabilitation Contact information: [address] [place] [address] [phone]         [facility] Follow up.   Contact information: [address]     [address] [place] [address] [phone]                Signed: Milinda Antis, PA-C 11/04/2023, 9:40 AM

## 2023-11-04 NOTE — Progress Notes (Addendum)
 This RN to assess pt and retake vitals manually. Pt BP remains elevated  154/63 and and HR 115. RN encouraged pt to drink more PO water, placed IV consult for IV team to access Pt R sided chest port to follow orders for 500 cc bolus of normal saline. PT states she hasn't had much water today since vomiting 3 times this morning. RN will reassess.   2015 IV team at bedside to access R chest Port.  2025 500cc bolus started. Pt HR currently 112. IVF infusing. Pt did tolerate 240ccs PO water intake without N/V. RN will assess vitals after infusion.   2107 Infusion completed HR noted at 109 BP BP 155/67. [place]. Pt states she feels somewhat better. Pt is talking to a family member on phone and denies any acute distress. RN will call in call provider for update,    Pts HR sustaining 114 after 500cc bolus, Provider contacted and another verbal order to give 2nd 500cc bolus IVF 0.9% NS.  Pt HR 117 BP 160/68. Pt denies any other distress. Riley Lam, on call provider notified.   0150 New orders given for labs and PO medication to support BP and HR. Please see new orders. Pt denies any CP, shob, or any other distress. Pt had another large BM and states she feels somewhat better. New orders followed.    Pt HR 96 BP is soft 107/55. Pt is currently drinking water PO. Cbg 135 CHG bath given by RN  BP 115/50 HR 94 96% RA  Pt c/o of dizziness and has shob only with exertion, and increased nausea with mobility. Pt has increased weakness as well. Providers are aware, new orders to follow.

## 2023-11-04 NOTE — Discharge Instructions (Addendum)
 Inpatient Rehab Discharge Instructions  AUGUST GOSSER Discharge date and time: TBD Activities/Precautions/ Functional Status: Activity: no lifting, driving, or strenuous exercise for until cleared by MD Diet: diabetic diet Wound Care: none needed Functional status:  ___ No restrictions     ___ Walk up steps independently ___ 24/7 supervision/assistance   ___ Walk up steps with assistance _x__ Intermittent supervision/assistance  ___ Bathe/dress independently ___ Walk with walker     ___ Bathe/dress with assistance ___ Walk Independently    ___ Shower independently ___ Walk with assistance    __x_ Shower with assistance __x_ No alcohol     ___ Return to work/school ________  Special Instructions: No driving, alcohol consumption or tobacco use.  STROKE/TIA DISCHARGE INSTRUCTIONS SMOKING Cigarette smoking nearly doubles your risk of having a stroke & is the single most alterable risk factor  If you smoke or have smoked in the last 12 months, you are advised to quit smoking for your health. Most of the excess cardiovascular risk related to smoking disappears within a year of stopping. Ask you doctor about anti-smoking medications [place] Quit Line: 1-800-QUIT NOW Free Smoking Cessation Classes [phone]  CHOLESTEROL Know your levels; limit fat & cholesterol in your diet  Lipid Panel     Component Value Date/Time   CHOL 112 10/30/2023 0248   CHOL 198 05/29/2021 0949   TRIG 164 (H) 10/30/2023 0248   HDL 38 (L) 10/30/2023 0248   HDL 72 05/29/2021 0949   CHOLHDL 2.9 10/30/2023 0248   VLDL 33 10/30/2023 0248   LDLCALC 41 10/30/2023 0248   LDLCALC 96 05/29/2021 0949     Many patients benefit from treatment even if their cholesterol is at goal. Goal: Total Cholesterol (CHOL) less than 160 Goal:  Triglycerides (TRIG) less than 150 Goal:  HDL greater than 40 Goal:  LDL (LDLCALC) less than 100   BLOOD PRESSURE American Stroke Association blood pressure target is less that 120/80  mm/Hg  Your discharge blood pressure is:  BP: 137/66 Monitor your blood pressure Limit your salt and alcohol intake Many individuals will require more than one medication for high blood pressure  DIABETES (A1c is a blood sugar average for last 3 months) Goal HGBA1c is under 7% (HBGA1c is blood sugar average for last 3 months)  Diabetes: Diagnosis of diabetes:  Your A1c:9.2 %    Lab Results  Component Value Date   HGBA1C 9.2 (H) 10/29/2023    Your HGBA1c can be lowered with medications, healthy diet, and exercise. Check your blood sugar as directed by your physician Call your physician if you experience unexplained or low blood sugars.  PHYSICAL ACTIVITY/REHABILITATION Goal is 30 minutes at least 4 days per week  Activity: Increase activity slowly, Therapies: Physical Therapy: Outpatient and Occupational Therapy: Outpatient Return to work: when cleared by MD Activity decreases your risk of heart attack and stroke and makes your heart stronger.  It helps control your weight and blood pressure; helps you relax and can improve your mood. Participate in a regular exercise program. Talk with your doctor about the best form of exercise for you (dancing, walking, swimming, cycling).  DIET/WEIGHT Goal is to maintain a healthy weight  Your discharge diet is:  Diet Order             Diet Carb Modified Fluid consistency: Thin; Room service appropriate? Yes  Diet effective now                  thin liquids Your height  is:  Height: 5\' 3"  (160 cm) Your current weight is: Weight: 86.2 kg Your Body Mass Index (BMI) is:  BMI (Calculated): 33.67 Following the type of diet specifically designed for you will help prevent another stroke. Your goal weight range is:   Your goal Body Mass Index (BMI) is 19-24. Healthy food habits can help reduce 3 risk factors for stroke:  High cholesterol, hypertension, and excess weight.  RESOURCES Stroke/Support Group:  Call [phone]   STROKE EDUCATION  PROVIDED/REVIEWED AND GIVEN TO PATIENT Stroke warning signs and symptoms How to activate emergency medical system (call 911). Medications prescribed at discharge. Need for follow-up after discharge. Personal risk factors for stroke. Pneumonia vaccine given: No Flu vaccine given: No My questions have been answered, the writing is legible, and I understand these instructions.  I will adhere to these goals & educational materials that have been provided to me after my discharge from the hospital.    COMMUNITY REFERRALS UPON DISCHARGE:    Outpatient: PT     OT             Agency: [facility]      Phone: [phone]             Appointment Date/Time:*Please expect follow-up within 7-10 business days to schedule your appointment. If you have not received follow-up, be sure to contact the site directly.*      My questions have been answered and I understand these instructions. I will adhere to these goals and the provided educational materials after my discharge from the hospital.  Patient/Caregiver Signature _______________________________ Date __________  Clinician Signature _______________________________________ Date __________  Please bring this form and your medication list with you to all your follow-up doctor's appointments.

## 2023-11-04 NOTE — Progress Notes (Signed)
 PROGRESS NOTE   Subjective/Complaints: Discussed that vascular ultrasound shows no clot Discussed no BM after sorbitol x2, asked nursing to administer suppository  ROS: +right upper extremity tightness, +constipation  Objective:   VAS [place] UPPER EXTREMITY VENOUS DUPLEX Result Date: 11/03/2023 UPPER VENOUS STUDY  Patient Name:  [name]  Date of Exam:   11/03/2023 Medical Rec #: [id number]        Accession #:    [id number] Date of Birth: 05/09/1951         Patient Gender: F Patient Age:   73 years Exam Location:  [facility] Procedure:      VAS [place] UPPER EXTREMITY VENOUS DUPLEX Referring Phys: Sula Soda --------------------------------------------------------------------------------  Indications: Swelling, and stroke Comparison Study: No priors. Performing Technologist: Monee Sink Sturdivant-Jones RDMS, RVT  Examination Guidelines: A complete evaluation includes B-mode imaging, spectral Doppler, color Doppler, and power Doppler as needed of all accessible portions of each vessel. Bilateral testing is considered an integral part of a complete examination. Limited examinations for reoccurring indications may be performed as noted.  Right Findings: +----------+------------+---------+-----------+----------+--------------+ RIGHT     CompressiblePhasicitySpontaneousProperties   Summary     +----------+------------+---------+-----------+----------+--------------+ IJV           Full       Yes       Yes                             +----------+------------+---------+-----------+----------+--------------+ Subclavian    Full       Yes       Yes                             +----------+------------+---------+-----------+----------+--------------+ Axillary      Full       Yes       Yes                             +----------+------------+---------+-----------+----------+--------------+ Brachial      Full                                                  +----------+------------+---------+-----------+----------+--------------+ Radial        Full                                                 +----------+------------+---------+-----------+----------+--------------+ Ulnar         Full                                                 +----------+------------+---------+-----------+----------+--------------+ Cephalic  Not visualized +----------+------------+---------+-----------+----------+--------------+ Basilic       Full                                                 +----------+------------+---------+-----------+----------+--------------+  Left Findings: +----------+------------+---------+-----------+----------+-------+ LEFT      CompressiblePhasicitySpontaneousPropertiesSummary +----------+------------+---------+-----------+----------+-------+ Subclavian               Yes       Yes                      +----------+------------+---------+-----------+----------+-------+  Summary:  Right: No evidence of deep vein thrombosis in the upper extremity.  Left: No evidence of thrombosis in the subclavian.  *See table(s) above for measurements and observations.  Diagnosing physician: Coral Else MD Electronically signed by Coral Else MD on 11/03/2023 at 8:42:51 PM.    Final    Recent Labs    11/03/23 0514  WBC 6.0  HGB 10.9*  HCT 33.2*  PLT 221   Recent Labs    11/03/23 0514  NA 139  K 3.6  CL 105  CO2 25  GLUCOSE 86  BUN 18  CREATININE 1.06*  CALCIUM 9.2    Intake/Output Summary (Last 24 hours) at 11/04/2023 1104 Last data filed at 11/04/2023 0907 Gross per 24 hour  Intake 778 ml  Output 1 ml  Net 777 ml        Physical Exam: Vital Signs Blood pressure 137/66, pulse 82, temperature 97.9 F (36.6 C), resp. rate 18, height 5\' 3"  (1.6 m), weight 86.2 kg, SpO2 96%. .Gen: no distress, normal appearing HENT:     Head:  Normocephalic and atraumatic.     Comments: Decreased to light touch from nose downwards- R side    Nose: Nose normal. No congestion.     Mouth/Throat:     Mouth: Mucous membranes are dry.     Pharynx: Oropharynx is clear. No oropharyngeal exudate.  Eyes:     General:        Right eye: No discharge.        Left eye: No discharge.     Comments: Dysconjugate gaze L upper visual field cut- is chronic per pt No nystagmus  Cardiovascular:     Rate and Rhythm: Normal rate and regular rhythm.     Heart sounds: Normal heart sounds. No murmur heard.    No gallop.     Comments: Rate in 90's Pulmonary:     Effort: Pulmonary effort is normal. No respiratory distress.     Breath sounds: Normal breath sounds. No wheezing, rhonchi or rales.     Comments: Cough with deep breaths Abdominal:     General: Bowel sounds are normal. There is no distension.     Palpations: Abdomen is soft.     Tenderness: There is no abdominal tenderness.  Musculoskeletal:        General: Normal range of motion.     Cervical back: Neck supple. No tenderness.     Comments:  RUE- biceps 2+/5; Triceps 2/5; WE 3-/5; Grip 2/5; FA 1/5 LUE- 5/5 except FA 4/5 RLE- HF 2/5; KE 2-/5 DF/PF 0/5;  LLE-5-/5, improved 3/13 Skin:    General: Skin is warm and dry.     Comments: LUE- swelling- mild in hand- not elevated Port R chest- not accessed  Neurological:     Mental Status: She  is alert and oriented to person, place, and time.     Comments: Decreased on L side throughout   Psychiatric:        Mood and Affect: Mood normal.        Behavior: Behavior normal.   Assessment/Plan: 1. Functional deficits which require 3+ hours per day of interdisciplinary therapy in a comprehensive inpatient rehab setting. Physiatrist is providing close team supervision and 24 hour management of active medical problems listed below. Physiatrist and rehab team continue to assess barriers to discharge/monitor patient progress toward functional  and medical goals  Care Tool:  Bathing    Body parts bathed by patient: Right arm, Left arm, Chest, Abdomen, Right upper leg, Left upper leg, Face, Right lower leg, Left lower leg   Body parts bathed by helper: Front perineal area, Buttocks     Bathing assist Assist Level: Moderate Assistance - Patient 50 - 74%     Upper Body Dressing/Undressing Upper body dressing   What is the patient wearing?: Pull over shirt    Upper body assist Assist Level: Moderate Assistance - Patient 50 - 74%    Lower Body Dressing/Undressing Lower body dressing      What is the patient wearing?: Pants     Lower body assist Assist for lower body dressing: Moderate Assistance - Patient 50 - 74%     Toileting Toileting    Toileting assist Assist for toileting: Moderate Assistance - Patient 50 - 74%     Transfers Chair/bed transfer  Transfers assist  Chair/bed transfer activity did not occur: Safety/medical concerns (nausea, lethargy)  Chair/bed transfer assist level: Minimal Assistance - Patient > 75%     Locomotion Ambulation   Ambulation assist   Ambulation activity did not occur: Safety/medical concerns (nausea, lethargy)  Assist level: Minimal Assistance - Patient > 75% Assistive device: Walker-rolling Max distance: 44ft   Walk 10 feet activity   Assist  Walk 10 feet activity did not occur: Safety/medical concerns (nausea, lethargy)  Assist level: Minimal Assistance - Patient > 75% Assistive device: Walker-rolling   Walk 50 feet activity   Assist Walk 50 feet with 2 turns activity did not occur: Safety/medical concerns (nausea, lethargy)  Assist level: Minimal Assistance - Patient > 75% Assistive device: Walker-rolling    Walk 150 feet activity   Assist Walk 150 feet activity did not occur: Safety/medical concerns (nausea, lethargy)         Walk 10 feet on uneven surface  activity   Assist Walk 10 feet on uneven surfaces activity did not occur:  Safety/medical concerns (nausea, lethargy)         Wheelchair     Assist Is the patient using a wheelchair?: Yes Type of Wheelchair: Manual Wheelchair activity did not occur: Safety/medical concerns (nausea, lethargy)         Wheelchair 50 feet with 2 turns activity    Assist    Wheelchair 50 feet with 2 turns activity did not occur: Safety/medical concerns (nausea, lethargy)       Wheelchair 150 feet activity     Assist  Wheelchair 150 feet activity did not occur: Safety/medical concerns (nausea, lethargy)       Blood pressure 137/66, pulse 82, temperature 97.9 F (36.6 C), resp. rate 18, height 5\' 3"  (1.6 m), weight 86.2 kg, SpO2 96%. Medical Problem List and Plan: 1. 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  Antithrombotics: -DVT/anticoagulation:  Pharmaceutical: Lovenox             -antiplatelet therapy: Aspirin and Plavix for three weeks followed by Plavix alone (has ICA stent)   3. Pain Management: Tylenol as needed             -Lyrica 75 mg BID   4. Mood/Behavior/Sleep: LCSW to evaluate and provide emotional support             -antipsychotic agents: n/a   5. Neuropsych/cognition: This patient is capable of making decisions on her own behalf.   6. Skin/Wound Care: Routine skin care checks   7. 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 Nausea: 2/2 constipation: sorbitol ordered, suppository ordered  20. Right upper extremity pain: VAS [place] ordered to assess for clot, discussed that this was negative    LOS: 2 days A FACE TO FACE EVALUATION WAS PERFORMED  Kristeen Lantz P Tyrelle Raczka 11/04/2023, 11:04 AM

## 2023-11-04 NOTE — Progress Notes (Signed)
 Physical Therapy Session Note  Patient Details  Name: [name] MRN: [id number] Date of Birth: 02-Apr-1951  Today's Date: 11/04/2023 PT Individual Time: 0945-1000 PT Individual Time Calculation (min): 15 min   Short Term Goals: Week 1:  PT Short Term Goal 1 (Week 1): pt will perform bed mobility with CGA overall PT Short Term Goal 2 (Week 1): pt will perform transfers with LRAD and CGA PT Short Term Goal 3 (Week 1): pt will ambulate 37ft with LRAD and CGA  Skilled Therapeutic Interventions/Progress Updates:    Chart reviewed and pt agreeable to therapy. Pt received seated on toilet with 6/10 c/o pain generalized t/o body. Session focused on functional transfers and amb to promote safe home access. Pt initiated session with S for pericare. Pt then completed amb to sink for handwashing and self care using CGA + RW. Pt noted to have good balance at sink requiring CGA. Pt then completed amb to EOB using CGA + RW. Pt then c/o high nausea. Pt then displayed severe emesis. RN notified.PT remained with pt until RN arrived. Pt continued to have severe emesis.Pt directly handed off to RN for medical management.     Therapy Documentation Precautions:  Precautions Precautions: Fall Precaution/Restrictions Comments: R hemiparesis Restrictions Weight Bearing Restrictions Per Provider Order: No General: PT Amount of Missed Time (min): 30 Minutes PT Missed Treatment Reason: Patient ill (Comment) (severe emesis)  Therapy/Group: Individual Therapy  Dionne Milo 11/04/2023, 10:07 AM

## 2023-11-04 NOTE — Progress Notes (Signed)
 Nurse called reporting patient with tachycardia , she was asked to check her apical pulse, and vitals. Awaiting readings.  Dr Carlis Abbott note was reviewed.

## 2023-11-04 NOTE — Patient Care Conference (Signed)
 Inpatient RehabilitationTeam Conference and Plan of Care Update Date: 11/03/2023   Time: 11:39 AM    Patient Name: [name]      Medical Record Number: [id number]  Date of Birth: [birth year] Sex: Female         Room/Bed: 4M07C/4M07C-01 Payor Info: Payor: [employer] MEDICARE / Plan: [facility] MEDICARE / Product Type: *No Product type* /    Admit Date/Time:  11/02/2023  2:52 PM  Primary Diagnosis:  Right pontine stroke [facility])  Hospital Problems: Principal Problem:   Right pontine stroke [facility])    Expected Discharge Date: Expected Discharge Date: 11/15/23  Team Members Present: Physician leading conference: Dr. Sula Soda Social Worker Present: Cecile Sheerer, LCSWA Nurse Present: Chana Bode, RN PT Present: Blima Rich, PT OT Present: Candee Furbish, OT PPS Coordinator present : Fae Pippin, SLP     Current Status/Progress Goal Weekly Team Focus  Bowel/Bladder   Continent of bladder/bowel, LBM 10/31/23   Restore normalcy with bowels and regularity with urine   QS/PRN assessment of toileting    Swallow/Nutrition/ Hydration               ADL's   Mod A overall for BADLs   Mod I   Barriers- limited by dizziness/nausea, R hemiparesis, endurance, dynamic standing balance    Mobility   rolling L/R min A, supine<>sit min A, and sit<>supine mod A for BLE management. Deferred standing/transfers due to nausea/lethargy   Mod I  barriers: nausea, lethargy, R hemiparesis, 2 STE with no rails, lives alone    Communication                Safety/Cognition/ Behavioral Observations               Pain   verbal discomfort frontal headache -medicated   0/10   QS/PRN assessment with followup    Skin   Skin intact   Skin intergrity remain intact  Aseesees skin      Discharge Planning:  TBA. Per EMR, pt will d/c tohome with support from her sister and other siblings as well to assist. SW will confirm there are no barriers to discharge.   Team  Discussion: Patient (T1DM on insulin pump) post right pontine CVA with right UE hemiparesis. Limited by hypertension when sitting at the edge of the bed with nausea and dizziness.  Patient on target to meet rehab goals: yes, currently needs mod assist for ADLs and toileting. Needs min assist to roll.  Goals for discharge set for Mod I overall.  *See Care Plan and progress notes for long and short-term goals.   Revisions to Treatment Plan:  Ultrasound for right UE   Teaching Needs: Safety, medications, transfers, toileting, etc.   Current Barriers to Discharge: Decreased caregiver support and Home enviroment access/layout  Possible Resolutions to Barriers: Family education     Medical Summary Current Status: obesity, AKI, anemia, type 2 diabetes insulin dependent with hypoglycemia, right upper extremity tightness, CVA  Barriers to Discharge: Medical stability;Morbid Obesity  Barriers to Discharge Comments: obesity, AKI, anemia, type 2 diabetes insulin dependent with hypoglycemia, right upper extremity tightness, CVA Possible Resolutions to [employer] Focus: provided dietary education, creatinine repeated, continue inulin pump, discussed administering no more than 11U bolus, continue aspirin   Continued Need for Acute Rehabilitation Level of Care: The patient requires daily medical management by a physician with specialized training in physical medicine and rehabilitation for the following reasons: Direction of a multidisciplinary physical rehabilitation program to maximize functional independence :  Yes Medical management of patient stability for increased activity during participation in an intensive rehabilitation regime.: Yes Analysis of laboratory values and/or radiology reports with any subsequent need for medication adjustment and/or medical intervention. : Yes   I attest that I was present, lead the team conference, and concur with the assessment and plan of the  team.   Chana Bode B 11/04/2023, 12:12 PM

## 2023-11-04 NOTE — Progress Notes (Signed)
 Occupational Therapy Session Note  Patient Details  Name: [name] MRN: [id number] Date of Birth: 1951/01/08  Today's Date: 11/04/2023 OT Individual Time: 1049-1200 OT Individual Time Calculation (min): 71 min    Short Term Goals: Week 1:  OT Short Term Goal 1 (Week 1): Pt will complete sit > stand with CGA OT Short Term Goal 2 (Week 1): Pt will complete toilet transfer with CGA using LRAD OT Short Term Goal 3 (Week 1): Pt will donn LB clothing supervision using AE PRN  Skilled Therapeutic Interventions/Progress Updates:  Pt greeted asleep in supine, pt reports earlier nausea with nurse providing nausea meds that make her sleepy however pt agreeable to OT intervention from bed level.   NMR: pt completed various activities to facilitate improved RUE proprioception and coordination for ADL participation:  -positioned bed at 34* with wedge positioned on table in front of pt with pt instructed to slide wash cloth to R<>L side with an emphasis on improving horizontal shoulder ABD/ADD and providing NMR to RUE. Pt completed task with supervision for 1 min  -pt completed same task but this time with pt completing forward/backwards table slides with a focus on improving scapular protraction/retraction, pt completed task with supervision for 1 min  - continued to work on AROM in RUE with pt completing "towel circles" on wedge with a focus on shoulder circumduction. Pt able to complete this exercise for 2x2 mins with supervision.  -worked on functional reaching and improving grasp/release with RUE with pt instructed to use RUE to remove/place pegs in peg board positioned on wedge. Pt completed task with + time but overall supervision -graded task up and added bimanual challenge with pt instructed to use BUEs to pull a part pegs stacked on top of each other, pt completed task with + time as RUE instructed to complete the manipulation element. Overall supervision assist to complete bilateral  integration task -pt completed various exercises as indicated below with 1lb weighted ball to promote NMR to RUE and improve motor planning and proprioception:   -2 mins of bicep curls   -2 mins of wrist supination/pronation with BUEs positioned on ball  -2 mins of chest presses with BUEs holding ball to facilitate improved bimanual tasks for ADL participation   -pt completed 2 mins of PNF reaching to R and L side with pt needing MIN A to support R hand     Exercises: pt completed below therex with level 2 theraband to promote improved strength and endurance for RUE:  -2 mins of forward punches with RUE from semi reclined in bed ( bed at 34*) - of shoulder flexion to ~ 80* shoulder flexion  Pt completed 3 mins of horizontal shoulder ABD/ADD with pt instructed to hold 1 lb dowel rod and take it side/side with a focus on concentric strength/endurance. Pt needed 2 rest breaks during the 3 min interval  Ended session with pt supine in bed with all needs within reach and bed alarm activated.                    Therapy Documentation Precautions:  Precautions Precautions: Fall Precaution/Restrictions Comments: R hemiparesis Restrictions Weight Bearing Restrictions Per Provider Order: No  Pain: pt reports a "stretch" in RUE during therex, rest breaks provided as needed    Therapy/Group: Individual Therapy  Barron Schmid 11/04/2023, 12:32 PM

## 2023-11-05 ENCOUNTER — Inpatient Hospital Stay (HOSPITAL_COMMUNITY)

## 2023-11-05 DIAGNOSIS — R112 Nausea with vomiting, unspecified: Secondary | ICD-10-CM

## 2023-11-05 DIAGNOSIS — R7989 Other specified abnormal findings of blood chemistry: Secondary | ICD-10-CM

## 2023-11-05 DIAGNOSIS — D72829 Elevated white blood cell count, unspecified: Secondary | ICD-10-CM

## 2023-11-05 DIAGNOSIS — E1165 Type 2 diabetes mellitus with hyperglycemia: Secondary | ICD-10-CM

## 2023-11-05 DIAGNOSIS — R6510 Systemic inflammatory response syndrome (SIRS) of non-infectious origin without acute organ dysfunction: Secondary | ICD-10-CM | POA: Diagnosis not present

## 2023-11-05 DIAGNOSIS — G7000 Myasthenia gravis without (acute) exacerbation: Secondary | ICD-10-CM

## 2023-11-05 DIAGNOSIS — I6350 Cerebral infarction due to unspecified occlusion or stenosis of unspecified cerebral artery: Secondary | ICD-10-CM | POA: Diagnosis not present

## 2023-11-05 LAB — URINALYSIS, W/ REFLEX TO CULTURE (INFECTION SUSPECTED)
Bilirubin Urine: NEGATIVE
Glucose, UA: >=500 mg/dL — AB
Ketones, ur: 5 mg/dL — AB
Leukocytes,Ua: NEGATIVE
Nitrite: NEGATIVE
Protein, ur: NEGATIVE mg/dL
Specific Gravity, Urine: 1.017 (ref 1.005–1.030)
pH: 5 (ref 5.0–8.0)

## 2023-11-05 LAB — BASIC METABOLIC PANEL
Anion gap: 10 (ref 5–15)
BUN: 16 mg/dL (ref 8–23)
CO2: 22 mmol/L (ref 22–32)
Calcium: 8.4 mg/dL — ABNORMAL LOW (ref 8.9–10.3)
Chloride: 108 mmol/L (ref 98–111)
Creatinine, Ser: 0.99 mg/dL (ref 0.44–1.00)
GFR, Estimated: >60 mL/min (ref >60)
Glucose, Bld: 131 mg/dL — ABNORMAL HIGH (ref 70–99)
Potassium: 2.9 mmol/L — ABNORMAL LOW (ref 3.5–5.1)
Sodium: 140 mmol/L (ref 135–145)

## 2023-11-05 LAB — CBC WITH DIFFERENTIAL/PLATELET
Abs Immature Granulocytes: 0.05 10*3/uL (ref 0.00–0.07)
Basophils Absolute: 0 10*3/uL (ref 0.0–0.1)
Basophils Relative: 0 %
Eosinophils Absolute: 0 10*3/uL (ref 0.0–0.5)
Eosinophils Relative: 0 %
HCT: 32.1 % — ABNORMAL LOW (ref 36.0–46.0)
Hemoglobin: 10.6 g/dL — ABNORMAL LOW (ref 12.0–15.0)
Immature Granulocytes: 0 %
Lymphocytes Relative: 12 %
Lymphs Abs: 1.6 10*3/uL (ref 0.7–4.0)
MCH: 30.5 pg (ref 26.0–34.0)
MCHC: 33 g/dL (ref 30.0–36.0)
MCV: 92.5 fL (ref 80.0–100.0)
Monocytes Absolute: 1 10*3/uL (ref 0.1–1.0)
Monocytes Relative: 8 %
Neutro Abs: 9.9 10*3/uL — ABNORMAL HIGH (ref 1.7–7.7)
Neutrophils Relative %: 80 %
Platelets: 220 10*3/uL (ref 150–400)
RBC: 3.47 MIL/uL — ABNORMAL LOW (ref 3.87–5.11)
RDW: 12.3 % (ref 11.5–15.5)
WBC: 12.6 10*3/uL — ABNORMAL HIGH (ref 4.0–10.5)
nRBC: 0 % (ref 0.0–0.2)

## 2023-11-05 LAB — COMPREHENSIVE METABOLIC PANEL
ALT: 18 U/L (ref 0–44)
AST: 24 U/L (ref 15–41)
Albumin: 2.9 g/dL — ABNORMAL LOW (ref 3.5–5.0)
Alkaline Phosphatase: 71 U/L (ref 38–126)
Anion gap: 12 (ref 5–15)
BUN: 17 mg/dL (ref 8–23)
CO2: 22 mmol/L (ref 22–32)
Calcium: 8.2 mg/dL — ABNORMAL LOW (ref 8.9–10.3)
Chloride: 104 mmol/L (ref 98–111)
Creatinine, Ser: 1.07 mg/dL — ABNORMAL HIGH (ref 0.44–1.00)
GFR, Estimated: 56 mL/min — ABNORMAL LOW (ref >60)
Glucose, Bld: 132 mg/dL — ABNORMAL HIGH (ref 70–99)
Potassium: 2.9 mmol/L — ABNORMAL LOW (ref 3.5–5.1)
Sodium: 138 mmol/L (ref 135–145)
Total Bilirubin: 0.6 mg/dL (ref 0.0–1.2)
Total Protein: 6.5 g/dL (ref 6.5–8.1)

## 2023-11-05 LAB — D-DIMER, QUANTITATIVE: D-Dimer, Quant: 0.89 ug{FEU}/mL — ABNORMAL HIGH (ref 0.00–0.50)

## 2023-11-05 LAB — GLUCOSE, CAPILLARY
Glucose-Capillary: 115 mg/dL — ABNORMAL HIGH (ref 70–99)
Glucose-Capillary: 254 mg/dL — ABNORMAL HIGH (ref 70–99)

## 2023-11-05 LAB — PROCALCITONIN: Procalcitonin: <0.1 ng/mL

## 2023-11-05 LAB — TSH: TSH: 0.02 u[IU]/mL — ABNORMAL LOW (ref 0.350–4.500)

## 2023-11-05 MED ORDER — POTASSIUM CHLORIDE 20 MEQ PO PACK: 40.0000 meq | PACK | Freq: Two times a day (BID) | ORAL | Status: DC

## 2023-11-05 MED ORDER — TECHNETIUM TO 99M ALBUMIN AGGREGATED
4.0000 | Freq: Once | INTRAVENOUS | Status: AC | PRN
  Administered 2023-11-05: 4 via INTRAVENOUS

## 2023-11-05 MED ORDER — POTASSIUM CHLORIDE 20 MEQ PO PACK
40.0000 meq | PACK | Freq: Two times a day (BID) | ORAL | Status: DC
  Filled 2023-11-05: qty 2

## 2023-11-05 MED ORDER — TRIMETHOBENZAMIDE HCL 100 MG/ML IM SOLN
200.0000 mg | Freq: Four times a day (QID) | INTRAMUSCULAR | Status: DC | PRN
  Administered 2023-11-05 – 2023-11-09 (×3): 200 mg via INTRAMUSCULAR
  Filled 2023-11-05 (×4): qty 2

## 2023-11-05 MED ORDER — POTASSIUM CHLORIDE CRYS ER 20 MEQ PO TBCR
40.0000 meq | EXTENDED_RELEASE_TABLET | Freq: Two times a day (BID) | ORAL | Status: DC
  Administered 2023-11-05 – 2023-11-08 (×7): 40 meq via ORAL
  Filled 2023-11-05 (×7): qty 2

## 2023-11-05 MED ORDER — SODIUM CHLORIDE 0.9 % IV SOLN: INTRAVENOUS | Status: AC

## 2023-11-05 MED ORDER — ALPRAZOLAM 0.25 MG PO TABS
0.2500 mg | ORAL_TABLET | Freq: Once | ORAL | Status: AC
  Administered 2023-11-05: 0.25 mg via ORAL
  Filled 2023-11-05: qty 1

## 2023-11-05 MED ORDER — IRBESARTAN 75 MG PO TABS
75.0000 mg | ORAL_TABLET | Freq: Once | ORAL | Status: AC
  Administered 2023-11-05: 75 mg via ORAL
  Filled 2023-11-05: qty 1

## 2023-11-05 NOTE — Progress Notes (Signed)
 VAST consult for labs. Reviewed lab orders with RN. No current labs ordered at this time. Instructed nurse to reconsult as needed for any further vasculature needs. RN VU. Tomasita Morrow, RN VAST

## 2023-11-05 NOTE — Progress Notes (Addendum)
 PROGRESS NOTE   Subjective/Complaints: Patient continues to feel nauseous this morning, had BM Was tachycardic last night and had some chest pain, D dimer elevated- V/Q scan ordered as she has contrast allergy  ROS: +right upper extremity tightness, +constipation, +emesis  Objective:   VAS [place] LOWER EXTREMITY VENOUS (DVT) Result Date: 11/05/2023  Lower Venous DVT Study Patient Name:  [name]  Date of Exam:   11/05/2023 Medical Rec #: [id number]        Accession #:    [id number] Date of Birth: Dec 13, 1950         Patient Gender: F Patient Age:   73 years Exam Location:  [facility] Procedure:      VAS [place] LOWER EXTREMITY VENOUS (DVT) Referring Phys: PAMELA LOVE --------------------------------------------------------------------------------  Indications: Stroke, and Elevated D-dimer, right face, arm and leg numbness. Other Indications: Thyroid cancer, left TIA, left infarct. Limitations: Calcification in adjacent arteries. Comparison Study: Previous study on 4.28.2021. Performing Technologist: Fernande Bras  Examination Guidelines: A complete evaluation includes B-mode imaging, spectral Doppler, color Doppler, and power Doppler as needed of all accessible portions of each vessel. Bilateral testing is considered an integral part of a complete examination. Limited examinations for reoccurring indications may be performed as noted. The reflux portion of the exam is performed with the patient in reverse Trendelenburg.  +---------+---------------+---------+-----------+----------+--------------+ RIGHT    CompressibilityPhasicitySpontaneityPropertiesThrombus Aging +---------+---------------+---------+-----------+----------+--------------+ CFV      Full           Yes      Yes                                 +---------+---------------+---------+-----------+----------+--------------+ SFJ      Full           Yes      Yes                                  +---------+---------------+---------+-----------+----------+--------------+ FV Prox  Full                                                        +---------+---------------+---------+-----------+----------+--------------+ FV Mid   Full                                                        +---------+---------------+---------+-----------+----------+--------------+ FV DistalFull                                                        +---------+---------------+---------+-----------+----------+--------------+ PFV      Full                                                        +---------+---------------+---------+-----------+----------+--------------+  POP      Full           Yes      Yes                                 +---------+---------------+---------+-----------+----------+--------------+ PTV      Full                                                        +---------+---------------+---------+-----------+----------+--------------+ PERO     Full                                                        +---------+---------------+---------+-----------+----------+--------------+   +---------+---------------+---------+-----------+----------+--------------+ LEFT     CompressibilityPhasicitySpontaneityPropertiesThrombus Aging +---------+---------------+---------+-----------+----------+--------------+ CFV      Full           Yes      Yes                                 +---------+---------------+---------+-----------+----------+--------------+ SFJ      Full           Yes      Yes                                 +---------+---------------+---------+-----------+----------+--------------+ FV Prox  Full                                                        +---------+---------------+---------+-----------+----------+--------------+ FV Mid   Full                                                         +---------+---------------+---------+-----------+----------+--------------+ FV DistalFull                                                        +---------+---------------+---------+-----------+----------+--------------+ PFV      Full                                                        +---------+---------------+---------+-----------+----------+--------------+ POP      Full           Yes      Yes                                 +---------+---------------+---------+-----------+----------+--------------+  PTV      Full                                                        +---------+---------------+---------+-----------+----------+--------------+ PERO     Full                                                        +---------+---------------+---------+-----------+----------+--------------+    Summary: BILATERAL: - No evidence of deep vein thrombosis seen in the lower extremities, bilaterally. -No evidence of popliteal cyst, bilaterally.   *See table(s) above for measurements and observations.    Preliminary    DG Chest 2 View Result Date: 11/05/2023 CLINICAL DATA:  Follow-up.  Cough and nausea and vomiting. EXAM: CHEST - 2 VIEW COMPARISON:  09/16/2021 FINDINGS: There is a right chest wall port a catheter with tip at the superior cavoatrial junction. Heart size and mediastinal contours appear normal. No pleural fluid, interstitial edema or airspace disease. Visualized osseous structures are unremarkable. IMPRESSION: No acute cardiopulmonary disease. Electronically Signed   By: Signa Kell M.D.   On: 11/05/2023 07:26   DG Abd 1 View Result Date: 11/04/2023 CLINICAL DATA:  Nausea and vomiting. EXAM: ABDOMEN - 1 VIEW COMPARISON:  None Available. FINDINGS: No bowel dilatation to suggest obstruction. Small to moderate formed stool in the colon. No radiopaque calculi. Right upper quadrant surgical clips, typically from cholecystectomy. No evidence of free air in the supine view.  IMPRESSION: Nonobstructive bowel gas pattern. Electronically Signed   By: Narda Rutherford M.D.   On: 11/04/2023 15:39   VAS [place] UPPER EXTREMITY VENOUS DUPLEX Result Date: 11/03/2023 UPPER VENOUS STUDY  Patient Name:  [name]  Date of Exam:   11/03/2023 Medical Rec #: [id number]        Accession #:    [id number] Date of Birth: 1950-10-18         Patient Gender: F Patient Age:   26 years Exam Location:  [facility] Procedure:      VAS [place] UPPER EXTREMITY VENOUS DUPLEX Referring Phys: Sula Soda --------------------------------------------------------------------------------  Indications: Swelling, and stroke Comparison Study: No priors. Performing Technologist: [place] Sink Sturdivant-Jones RDMS, RVT  Examination Guidelines: A complete evaluation includes B-mode imaging, spectral Doppler, color Doppler, and power Doppler as needed of all accessible portions of each vessel. Bilateral testing is considered an integral part of a complete examination. Limited examinations for reoccurring indications may be performed as noted.  Right Findings: +----------+------------+---------+-----------+----------+--------------+ RIGHT     CompressiblePhasicitySpontaneousProperties   Summary     +----------+------------+---------+-----------+----------+--------------+ IJV           Full       Yes       Yes                             +----------+------------+---------+-----------+----------+--------------+ Subclavian    Full       Yes       Yes                             +----------+------------+---------+-----------+----------+--------------+ Axillary  Full       Yes       Yes                             +----------+------------+---------+-----------+----------+--------------+ Brachial      Full                                                 +----------+------------+---------+-----------+----------+--------------+ Radial        Full                                                  +----------+------------+---------+-----------+----------+--------------+ Ulnar         Full                                                 +----------+------------+---------+-----------+----------+--------------+ Cephalic                                            Not visualized +----------+------------+---------+-----------+----------+--------------+ Basilic       Full                                                 +----------+------------+---------+-----------+----------+--------------+  Left Findings: +----------+------------+---------+-----------+----------+-------+ LEFT      CompressiblePhasicitySpontaneousPropertiesSummary +----------+------------+---------+-----------+----------+-------+ Subclavian               Yes       Yes                      +----------+------------+---------+-----------+----------+-------+  Summary:  Right: No evidence of deep vein thrombosis in the upper extremity.  Left: No evidence of thrombosis in the subclavian.  *See table(s) above for measurements and observations.  Diagnosing physician: Coral Else MD Electronically signed by Coral Else MD on 11/03/2023 at 8:42:51 PM.    Final    Recent Labs    11/03/23 0514 11/05/23 0202  WBC 6.0 12.6*  HGB 10.9* 10.6*  HCT 33.2* 32.1*  PLT 221 220   Recent Labs    11/03/23 0514 11/05/23 0202  NA 139 138  140  K 3.6 2.9*  2.9*  CL 105 104  108  CO2 25 22  22   GLUCOSE 86 132*  131*  BUN 18 17  16   CREATININE 1.06* 1.07*  0.99  CALCIUM 9.2 8.2*  8.4*    Intake/Output Summary (Last 24 hours) at 11/05/2023 1050 Last data filed at 11/05/2023 0600 Gross per 24 hour  Intake 747 ml  Output 240 ml  Net 507 ml        Physical Exam: Vital Signs Blood pressure (!) 153/72, pulse 93, temperature 97.8 F (36.6 C), temperature source Oral, resp. rate 16, height 5\' 3"  (1.6 m), weight 86.2 kg, SpO2 100%. .Gen: no distress, normal appearing HENT:     Head:  Normocephalic and  atraumatic.     Comments: Decreased to light touch from nose downwards- R side    Nose: Nose normal. No congestion.     Mouth/Throat:     Mouth: Mucous membranes are dry.     Pharynx: Oropharynx is clear. No oropharyngeal exudate.  Eyes:     General:        Right eye: No discharge.        Left eye: No discharge.     Comments: Dysconjugate gaze L upper visual field cut- is chronic per pt No nystagmus  Cardiovascular:     Rate and Rhythm: Normal rate and regular rhythm.     Heart sounds: Normal heart sounds. No murmur heard.    No gallop.     Comments: Rate in 90's Pulmonary:     Effort: Pulmonary effort is normal. No respiratory distress.     Breath sounds: Normal breath sounds. No wheezing, rhonchi or rales.     Comments: Cough with deep breaths Abdominal:     General: Bowel sounds are normal. There is no distension.     Palpations: Abdomen is soft.     Tenderness: There is no abdominal tenderness.  Musculoskeletal:        General: Normal range of motion.     Cervical back: Neck supple. No tenderness.     Comments:  RUE- biceps 2+/5; Triceps 2/5; WE 3-/5; Grip 2/5; FA 1/5 LUE- 5/5 except FA 4/5 RLE- 1/5 HF and KE, DF/PF 0/5;  LLE-5-/5, worsened 3/14 Skin:    General: Skin is warm and dry.     Comments: LUE- swelling- mild in hand- not elevated Port R chest- not accessed  Neurological:     Mental Status: She is alert and oriented to person, place, and time.     Comments: Decreased on L side throughout   Psychiatric:        Mood and Affect: Mood normal.        Behavior: Behavior normal.   Assessment/Plan: 1. Functional deficits which require 3+ hours per day of interdisciplinary therapy in a comprehensive inpatient rehab setting. Physiatrist is providing close team supervision and 24 hour management of active medical problems listed below. Physiatrist and rehab team continue to assess barriers to discharge/monitor patient progress toward functional and medical  goals  Care Tool:  Bathing    Body parts bathed by patient: Right arm, Left arm, Chest, Abdomen, Right upper leg, Left upper leg, Face, Right lower leg, Left lower leg   Body parts bathed by helper: Front perineal area, Buttocks     Bathing assist Assist Level: Moderate Assistance - Patient 50 - 74%     Upper Body Dressing/Undressing Upper body dressing   What is the patient wearing?: Pull over shirt    Upper body assist Assist Level: Moderate Assistance - Patient 50 - 74%    Lower Body Dressing/Undressing Lower body dressing      What is the patient wearing?: Pants     Lower body assist Assist for lower body dressing: Moderate Assistance - Patient 50 - 74%     Toileting Toileting    Toileting assist Assist for toileting: Moderate Assistance - Patient 50 - 74%     Transfers Chair/bed transfer  Transfers assist  Chair/bed transfer activity did not occur: Safety/medical concerns (nausea, lethargy)  Chair/bed transfer assist level: Minimal Assistance - Patient > 75%     Locomotion Ambulation   Ambulation assist   Ambulation activity did not occur: Safety/medical  concerns (nausea, lethargy)  Assist level: Minimal Assistance - Patient > 75% Assistive device: Walker-rolling Max distance: 16ft   Walk 10 feet activity   Assist  Walk 10 feet activity did not occur: Safety/medical concerns (nausea, lethargy)  Assist level: Minimal Assistance - Patient > 75% Assistive device: Walker-rolling   Walk 50 feet activity   Assist Walk 50 feet with 2 turns activity did not occur: Safety/medical concerns (nausea, lethargy)  Assist level: Minimal Assistance - Patient > 75% Assistive device: Walker-rolling    Walk 150 feet activity   Assist Walk 150 feet activity did not occur: Safety/medical concerns (nausea, lethargy)         Walk 10 feet on uneven surface  activity   Assist Walk 10 feet on uneven surfaces activity did not occur: Safety/medical  concerns (nausea, lethargy)         Wheelchair     Assist Is the patient using a wheelchair?: Yes Type of Wheelchair: Manual Wheelchair activity did not occur: Safety/medical concerns (nausea, lethargy)         Wheelchair 50 feet with 2 turns activity    Assist    Wheelchair 50 feet with 2 turns activity did not occur: Safety/medical concerns (nausea, lethargy)       Wheelchair 150 feet activity     Assist  Wheelchair 150 feet activity did not occur: Safety/medical concerns (nausea, lethargy)       Blood pressure (!) 153/72, pulse 93, temperature 97.8 F (36.6 C), temperature source Oral, resp. rate 16, height 5\' 3"  (1.6 m), weight 86.2 kg, SpO2 100%. Medical Problem List and Plan: 1. 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  Antithrombotics: -DVT/anticoagulation:  Pharmaceutical: Lovenox             -antiplatelet therapy: Aspirin and Plavix for three weeks followed by Plavix alone (has ICA stent)   3. Pain Management: Tylenol as needed             -Lyrica 75 mg BID   4. Mood/Behavior/Sleep: LCSW to evaluate and provide emotional support             -antipsychotic agents: n/a   5. Neuropsych/cognition: This patient is capable of making decisions on her own behalf.   6. Skin/Wound Care: Routine skin care checks   7. 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 Nausea: thought to be 2/2 constipation but has not resolved with constipation management- patient now having BM, improved later in the day  20. Right upper extremity pain: VAS [place] ordered to assess for clot, discussed that this was negative  21. Emesis: continue IV zofran, medicine consulted for their opinion, thyroid labs were ordered last night and are abnormal, improved later in the day  22. Chest pain/tachycardia: V/Q scan ordered given elevated D-Dimer/contrast allergy, [place] lower extremities ordered and is negative for clot  23. Hypokalemia: kdur 40 BID ordered x2 doses on 3/14     LOS: 3 days A FACE TO FACE EVALUATION WAS PERFORMED  Myrtha Tonkovich P Alezander Dimaano 11/05/2023, 10:50 AM

## 2023-11-05 NOTE — IPOC Note (Signed)
 Overall Plan of Care [facility]) Patient Details Name: [name] MRN: [id number] DOB: [birth year]  Admitting Diagnosis: Right pontine stroke [facility])  Hospital Problems: Principal Problem:   Right pontine stroke [facility])     Functional Problem List: Nursing Pain, Bowel, Safety, Endurance, Medication Management  PT Balance, Endurance, Motor, Pain, Sensory, Skin Integrity  OT Balance, Endurance, Motor, Pain, Sensory  SLP    TR         Basic ADL's: OT Bathing, Dressing, Toileting     Advanced  ADL's: OT Simple Meal Preparation     Transfers: PT Bed Mobility, Bed to Chair, Car, Occupational psychologist, Research scientist (life sciences): PT Ambulation, Psychologist, prison and probation services, Stairs     Additional Impairments: OT Fuctional Use of Upper Extremity  SLP        TR      Anticipated Outcomes Item Anticipated Outcome  Self Feeding Mod I  Swallowing      Basic self-care  Mod I  Toileting  Mod I   Bathroom Transfers Mod I  Bowel/Bladder  manage bowel w mod I assist  Transfers  Mod I with LRAD  Locomotion  Mod I with LRAD  Communication     Cognition     Pain  < 4 with prns  Safety/Judgment  manage safety w cues   Therapy Plan: PT Intensity: Minimum of 1-2 x/day ,45 to 90 minutes PT Frequency: 5 out of 7 days PT Duration Estimated Length of Stay: 12 days OT Intensity: Minimum of 1-2 x/day, 45 to 90 minutes OT Frequency: 5 out of 7 days OT Duration/Estimated Length of Stay: 10-12 days     Team Interventions: Nursing Interventions Patient/Family Education, Pain Management, Medication Management, Discharge Planning, Bowel Management, Disease Management/Prevention  PT interventions Ambulation/gait training, Discharge planning, Functional mobility training, Psychosocial support, Therapeutic Activities, Visual/perceptual remediation/compensation, Balance/vestibular training, Disease management/prevention, Neuromuscular re-education, Skin care/wound management, Therapeutic Exercise,  Wheelchair propulsion/positioning, DME/adaptive equipment instruction, Pain management, Splinting/orthotics, UE/LE Strength taining/ROM, Firefighter, Equities trader education, Museum/gallery curator, UE/LE Coordination activities  OT Interventions Warden/ranger, Discharge planning, Pain management, Self Care/advanced ADL retraining, Therapeutic Activities, UE/LE Coordination activities, Disease mangement/prevention, Functional mobility training, Patient/family education, Skin care/wound managment, Therapeutic Exercise, Visual/perceptual remediation/compensation, DME/adaptive equipment instruction, Neuromuscular re-education, Psychosocial support, Splinting/orthotics, UE/LE Strength taining/ROM  SLP Interventions    TR Interventions    SW/CM Interventions Discharge Planning, Psychosocial Support, Patient/Family Education   Barriers to Discharge MD  Medical stability  Nursing Decreased caregiver support, Home environment access/layout 1 level 2 ste no rails w sister to assist prn  PT Inaccessible home environment, Decreased caregiver support, Home environment access/layout 2 STE with no rails, lives alone, pain, nausea, and lethargy  OT Inaccessible home environment, Home environment access/layout    SLP      SW Lack of/limited family support, Decreased caregiver support     Team Discharge Planning: Destination: PT-Home ,OT- Home , SLP-  Projected Follow-up: PT-Home health PT, OT-  Outpatient OT, SLP-  Projected Equipment Needs: PT-To be determined, OT- To be determined, SLP-  Equipment Details: PT-has RW, OT-  Patient/family involved in discharge planning: PT- Patient,  OT-Patient, SLP-   MD ELOS: 7-10 days Medical Rehab Prognosis:  Excellent Assessment: The patient has been admitted for CIR therapies with the diagnosis of R pontine CVA. The team will be addressing functional mobility, strength, stamina, balance, safety, adaptive techniques and equipment, self-care, bowel  and bladder mgt, patient and caregiver education. Goals have been set at S. Anticipated discharge destination  is home.        See Team Conference Notes for weekly updates to the plan of care

## 2023-11-05 NOTE — Plan of Care (Signed)
  Problem: Consults Goal: RH STROKE PATIENT EDUCATION Description: See Patient Education module for education specifics  Outcome: Progressing   Problem: RH BOWEL ELIMINATION Goal: RH STG MANAGE BOWEL WITH ASSISTANCE Description: STG Manage Bowel with toileting Assistance. Outcome: Progressing Goal: RH STG MANAGE BOWEL W/MEDICATION W/ASSISTANCE Description: STG Manage Bowel with Medication with mod I Assistance. Outcome: Progressing   Problem: RH SAFETY Goal: RH STG ADHERE TO SAFETY PRECAUTIONS W/ASSISTANCE/DEVICE Description: STG Adhere to Safety Precautions With cues Assistance/Device. Outcome: Progressing   Problem: RH PAIN MANAGEMENT Goal: RH STG PAIN MANAGED AT OR BELOW PT'S PAIN GOAL Description: Pain < 4 with prns Outcome: Progressing   Problem: RH KNOWLEDGE DEFICIT Goal: RH STG INCREASE KNOWLEDGE OF DIABETES Description: Patient and sister will be able to manage DM using educational resources for medications and dietary modification independently Outcome: Progressing Goal: RH STG INCREASE KNOWLEDGE OF HYPERTENSION Description: Patient and sister will be able to manage HTN using educational resources for medications and dietary modification independently Outcome: Progressing Goal: RH STG INCREASE KNOWLEGDE OF HYPERLIPIDEMIA Description: Patient and sister will be able to manage HLD using educational resources for medications and dietary modification independently Outcome: Progressing Goal: RH STG INCREASE KNOWLEDGE OF STROKE PROPHYLAXIS Description: Patient and sister will be able to manage secondary risks using educational resources for medications and dietary modification independently Outcome: Progressing

## 2023-11-05 NOTE — Progress Notes (Addendum)
 Reporting dizziness, headache, and weakness of lower extremities. Vitals obtained. Stroke screening completed. Rotary nystagmus observed. Apraxia of eyelids observed improved over time. Once patient adjusted to bed dizziness improved. Generalized weakness began to dissipate. Vision improving. CBG obtained via patient's pump "274". 9.05 units bolus given by patient. Explained check CBG in 60 minutes per protocol.

## 2023-11-05 NOTE — Progress Notes (Signed)
 At 840 pt is complaining of burning sensation, increased thirst, chest pain (reports being uncertain if pain from vomiting) having a difficult time describing the pain. Does report some pressure. Some abdominal pain. Bowels are hyperactive. Pupils are equal and reactive. Nystagmus observed. Difficulty looking down lateral right and down lateral left. Ptosis observed when looking downwards. Denies headache or change of vision at this time. +3 with right arm and leg. Yesterday left arm + 4. Alert and oriented x 4. MD made aware and informed of change with patient. MD reports coming to see patient at this time. Also, PA assigned to patient made aware as well.  .. Vitals:   11/05/23 0503 11/05/23 1030  BP: (!) 118/52 (!) 153/72  Pulse: 94 93  Resp:  16  Temp:  97.8 F (36.6 C)  SpO2: 97% 100%

## 2023-11-05 NOTE — Progress Notes (Addendum)
 Events of last night reviewed as well as early labs results and discussed with Dr. Carlis Abbott. Procal pending. BLE venous duplex completed. VQ scan and CT head ordered and pending. She has iodinated contrast allergy. Abdominal pain improved after multiple bowel movements after enemas yesterday. She is still intermittently vomiting, belching and having stool output with retching. Fatigued. VSS. Afebrile.     Latest Ref Rng & Units 11/05/2023    2:02 AM 11/03/2023    5:14 AM 11/01/2023    9:43 AM  BMP  Glucose 70 - 99 mg/dL 409  86  811   BUN 8 - 23 mg/dL 16  18  16    Creatinine 0.44 - 1.00 mg/dL 9.14  7.82  9.56   Sodium 135 - 145 mmol/L 140  139  138   Potassium 3.5 - 5.1 mmol/L 2.9  3.6  4.0   Chloride 98 - 111 mmol/L 108  105  106   CO2 22 - 32 mmol/L 22  25  24    Calcium 8.9 - 10.3 mg/dL 8.4  9.2  9.2        Latest Ref Rng & Units 11/05/2023    2:02 AM 11/03/2023    5:14 AM 10/31/2023    6:22 AM  CBC  WBC 4.0 - 10.5 K/uL 12.6  6.0  3.7   Hemoglobin 12.0 - 15.0 g/dL 21.3  08.6  9.7   Hematocrit 36.0 - 46.0 % 32.1  33.2  29.9   Platelets 150 - 400 K/uL 220  221  156     D-dimer = 0.89 TSH 0.020 BLE venous duplex prelim read negative for DVT  Consulted Dr. Arlyss Queen, TRH.   1348 hrs CT head 11/05/2023: IMPRESSION: No acute intracranial abnormality. Expected CT appearance of the recent dorsal right pontine infarct and other chronic small vessel disease.     Electronically Signed   By: Odessa Fleming M.D.   On: 11/05/2023 13:20

## 2023-11-05 NOTE — Consult Note (Signed)
 Initial Consultation Note   Patient: [name] UJW:[id number] DOB: 1950-10-15 PCP: Irena Reichmann, DO DOA: 11/02/2023 DOS: the patient was seen and examined on 11/05/2023 Primary service: Horton Chin, MD   Reason for consult: Assistance with medical management  Assessment/Plan: Assessment and Plan:  History of CVA Patient initially presented with complaints of right-sided weakness and numbness.  Found to have suffered a right pontine stroke thought secondary to small vessel disease.  Ultrasound of the carotids noted right ICA gnosis at 40 to 59% and patent stent of the left internal carotid.  Placed on aspirin and Plavix for 3 weeks then Plavix alone.  Transferred to CIR for further rehab. -Continue aspirin, Plavix, -Per rehab  SIRS  leukocytosis Acute.  Patient was noted to be tachycardic overnight WBC elevated up to 12.6.   Procalcitonin was noted to be less than 0.1.  Chest x-ray showed no acute abnormality.  Urinalysis did not show significant signs for infection.  Possibly reactive due to nausea and vomiting.   -Recheck CBC in a.mmmmm  Nausea and vomiting Patient reports having nausea and vomiting unable to keep much food or liquids down.  This time patient also had been constipated with reports of abdominal pain.  Abdominal x-ray 3/13 done yesterday did not show any signs of obstruction, but small to moderate stool in colon.  Patient had been treated with stool softeners and subsequently had diarrhea.  Nausea and vomiting possibly secondary to reports of constipation which have since resolved.  Other possible causes include gastroparesis in the setting of uncontrolled diabetes. -Advance diet as tolerated -Tigan as needed for nausea and vomiting due to prolonged QT interval -Question need of further workup if symptoms persist with gastric emptying study  Elevated D-dimer D-dimer was noted to be elevated at 0.89.  Doppler ultrasound of the lower extremities subsequent VQ  scan negative.  Prolonged QT interval EKG revealed a normal sinus rhythm at 94 bpm with right bundle branch block and QTc 502.   -Correct electrolyte abnormalities -Recheck EKG in a.m.  Hypokalemia Acute.  Potassium noted to be acutely low at 2.9.  Possibly secondary to recent reports of diarrhea.  Patient was placed on potassium chloride x 2 doses -Continue to monitor and replace as needed  Uncontrolled diabetes mellitus type 2, with long-term use of insulin Patient on insulin pump and Jardiance at home.  Hemoglobin A1c was noted to be 9.2.  CBGs were noted to be difficult to control.   -Continue insulin pump  -Diabetic education consulted to assist  Hypertension -Continue current medication regimen  Ocular myasthenia gravis Patient followed by [facility] in the outpatient setting.  She received Rituxan and IVIG every 6 weeks -Continue prednisone and CellCept   Hypothyroidism TSH noted to be 0.02 which appears to be improved from most recent check on 3/10 of 0.012.Marland Kitchen  Records note Synthroid dose had been decreased from 200 mcg down to 100 mcg during her most recent hospitalization. -Continue current dose of levothyroxine -Recommend rechecking TSH in 4 weeks given recent dose adjustment/following up with endocrinology  Normocytic anemia 10.6 which appears around baseline. -Continue to monitor  Hyperlipidemia -Continue Crestor, Zetia, and Repatha  History of breast cancer -Continue tamoxifen   TRH will continue to follow the patient.  HPI: [name] is a 73 y.o. female with past medical history of hypertension, hyperlipidemia, CVA, insulin-dependent diabetes mellitus type 2 on insulin pump, myasthenia gravis, OSA, GERD, history of thyroid cancer s/p radiation and thyroidectomy, and history of breast  cancer status post chemo and radiation who initially presented for right-sided weakness and numbness.  Found to have acute right pontine stroke on 10/29/2023.  She was sent to  rehab on  She has been experiencing difficulty with eating and drinking, unable to keep food down since breakfast the previous day, leading to vomiting. Attempts to eat barbecue and mac and cheese resulted in only a few bites before needing to use the bathroom. She has managed to milligrams twice daily on ginger ale and chew on ice without vomiting. Diarrhea occurred following the use of a bowel softener, which was subsequently withheld this morning.  She reports that she had been constipated and had abdominal pain as a result of it.  However since having a bowel movement symptoms have improved.  She reports a persistent headache that has moved from the front to the back of the neck and the bottom of the head. A CT scan of the head obtained today showed no new abnormalities but noted a prior stroke and chronic small vessel disease.  She has a prolonged QT interval on her EKG, which restricts the use of certain medications.  She is currently on a reduced thyroid medication dose, decreased from 200 mcg to 100 mcg.  Patient was noted to be tachycardic BBC elevated at 12.6 with potassium of 2.9 and D-dimer of 0.89.  Doppler ultrasound of lower extremities did not show any signs for any DVT.  VQ scan was negative.  Patient had been placed on normal saline IV fluids at 50 mL potassium chloride 40 meq.   Review of Systems: As mentioned in the history of present illness. All other systems reviewed and are negative. Past Medical History:  Diagnosis Date   Arthritis    "all over my body" (03/13/2013)   Asthma    Asymptomatic carotid artery stenosis, bilateral 10/08/2018   Breast cancer (HCC)    GERD (gastroesophageal reflux disease)    H/O hiatal hernia    Headache    pt states she has had headaches for about 6 months "off and on"   Heart murmur    pt had echocardiogram on 09/17/21   Hypercholesterolemia 10/08/2018   Hypertension    Hypothyroidism    Myasthenia gravis (HCC)    "in my eyes;  diagnsosed > 7 yr ago" (03/13/2013)   Sleep apnea    on CPAP   Stroke (HCC) 2021   Thyroid carcinoma (HCC)    Type II diabetes mellitus (HCC)    Past Surgical History:  Procedure Laterality Date   ABDOMINAL HYSTERECTOMY     ANTERIOR CERVICAL DECOMP/DISCECTOMY FUSION     "I've had severa ORs; always went in from the front" (03/13/2013)   APPENDECTOMY     BREAST LUMPECTOMY WITH RADIOACTIVE SEED AND SENTINEL LYMPH NODE BIOPSY Left 09/02/2022   Procedure: LEFT BREAST LUMPECTOMY WITH RADIOACTIVE SEED AND SENTINEL LYMPH NODE BIOPSY;  Surgeon: Abigail Miyamoto, MD;  Location: [facility] OR;  Service: General;  Laterality: Left;   CARDIAC CATHETERIZATION     "several" (03/13/2013)   CARPAL TUNNEL RELEASE Right    CATARACT EXTRACTION W/ INTRAOCULAR LENS  IMPLANT, BILATERAL Bilateral    CHOLECYSTECTOMY     KNEE ARTHROSCOPY Left    SHOULDER ARTHROSCOPY W/ ROTATOR CUFF REPAIR Left    TONSILLECTOMY     TOTAL THYROIDECTOMY     TRANSCAROTID ARTERY REVASCULARIZATION  Left 09/24/2021   Procedure: LEFT TRANSCAROTID ARTERY REVASCULARIZATION;  Surgeon: Leonie Douglas, MD;  Location: [facility] OR;  Service: Vascular;  Laterality:  Left;   TRANSCAROTID ARTERY REVASCULARIZATION  Right 10/23/2021   Procedure: Right Transcarotid Artery Revascularization;  Surgeon: Leonie Douglas, MD;  Location: [facility] OR;  Service: Vascular;  Laterality: Right;   ULTRASOUND GUIDANCE FOR VASCULAR ACCESS Right 09/24/2021   Procedure: ULTRASOUND GUIDANCE FOR VASCULAR ACCESS;  Surgeon: Leonie Douglas, MD;  Location: [facility] OR;  Service: Vascular;  Laterality: Right;   ULTRASOUND GUIDANCE FOR VASCULAR ACCESS Right 10/23/2021   Procedure: ULTRASOUND GUIDANCE FOR VASCULAR ACCESS, LEFT FEMORAL VEIN;  Surgeon: Leonie Douglas, MD;  Location: [facility] OR;  Service: Vascular;  Laterality: Right;   Social History:  reports that she has never smoked. She has never used smokeless tobacco. She reports that she does not drink alcohol and does not use  drugs.  Allergies  Allergen Reactions   Contrast Media [Iodinated Contrast Media] Anaphylaxis    01/10/15 and 09/18/2021 --PT GIVEN 13 HR PRE MEDS FOR CT with contrast TOLERATED IV CONTRAST W/O ANY REACTION   Fluorescein Shortness Of Breath and Other (See Comments)    (Dye)   Iodine Anaphylaxis    Contrast dye - iodine   Magnesium-Containing Compounds Other (See Comments)    H/O myasthenia gravis- caution replacing IV    Molds & Smuts Shortness Of Breath and Other (See Comments)    Congestion and wheezing, also   Shellfish-Derived Products Anaphylaxis, Shortness Of Breath, Swelling and Other (See Comments)    Welts, also   Pholcodine Hives   Azithromycin Nausea Only   Codeine Hives and Nausea Only   Metformin Hcl Er Nausea Only   Oxycodone Nausea Only    Nausea 08/2022 admission.   Tramadol Hcl Nausea Only   Other Rash and Other (See Comments)    Coban causes welts, also    Family History  Problem Relation Age of Onset   Coronary artery disease Sister        s/p coronary stenting   Stroke Sister 67   Diabetes Sister    Congestive Heart Failure Mother    Asthma Mother    Diabetes Mother    Congestive Heart Failure Father    Lung cancer Father    Asthma Father    Diabetes Father    Diabetes Brother     Prior to Admission medications   Medication Sig Start Date End Date Taking? Authorizing Provider  acetaminophen (TYLENOL) 500 MG tablet Take 500 mg by mouth every 6 (six) hours as needed for moderate pain.    [provider]  aspirin EC 81 MG tablet Take 1 tablet (81 mg total) by mouth daily for 18 days. 11/02/23 11/20/23  Elgergawy, Leana Roe, MD  clopidogrel (PLAVIX) 75 MG tablet Take 1 tablet (75 mg total) by mouth daily. 11/03/23   Elgergawy, Leana Roe, MD  diphenoxylate-atropine (LOMOTIL) 2.5-0.025 MG tablet Take 1-2 tablets by mouth 4 (four) times daily as needed for diarrhea or loose stools. 10/28/22   Pollyann Samples, NP  ELDERBERRY PO Take 1 tablet by mouth daily.     [provider]  empagliflozin (JARDIANCE) 25 MG TABS tablet Take 25 mg by mouth daily.    [provider]  esomeprazole (NEXIUM) 40 MG capsule Take 1 capsule (40 mg total) by mouth 2 (two) times daily. 01/08/20   Angiulli, Mcarthur Rossetti, PA-C  Evolocumab with Infusor (REPATHA PUSHTRONEX SYSTEM) 420 MG/3.5ML SOCT Inject 420 mg into the skin every 30 (thirty) days.    [provider]  Exenatide ER (BYDUREON BCISE) 2 MG/0.85ML AUIJ Inject 2 mg  into the skin every Monday.    [provider]  ezetimibe (ZETIA) 10 MG tablet TAKE 1 TABLET BY MOUTH EVERY DAY 01/16/21   Yates Decamp, MD  Immune Globulin, Human, 40 GM/400ML SOLN Inject into the vein every 6 (six) weeks. Infusions are administered at home by home health nurse - last infusion 09/08/21 and 09/09/21; next infusion due 10/19/21 and 10/20/21 02/21/20   [provider]  insulin lispro (HUMALOG) 100 UNIT/ML injection Inject into the skin See admin instructions. Inject subcutaneously 3 (three) times daily with meals With meals through the Omnipod Dash Pump every 72 hours as directed    [provider]  levothyroxine (SYNTHROID) 100 MCG tablet Take 1 tablet (100 mcg total) by mouth daily at 6 (six) AM. 11/03/23   Elgergawy, Leana Roe, MD  lidocaine-prilocaine (EMLA) cream Apply to affected area once 10/15/22   Malachy Mood, MD  loperamide (IMODIUM A-D) 2 MG tablet Take 2 mg by mouth 4 (four) times daily as needed for diarrhea or loose stools.    [provider]  loratadine (CLARITIN) 10 MG tablet Take 1 tablet (10 mg total) by mouth at bedtime. 01/08/20   Angiulli, Mcarthur Rossetti, PA-C  meclizine (ANTIVERT) 25 MG tablet Take 25 mg by mouth 3 (three) times daily as needed for dizziness or nausea (or migraine-related nausea). 06/27/13   [provider]  montelukast (SINGULAIR) 10 MG tablet Take 1 tablet (10 mg total) by mouth every morning. 01/08/20   Angiulli, Mcarthur Rossetti, PA-C  mycophenolate (CELLCEPT) 500 MG  tablet Take 1,000 mg by mouth 2 (two) times daily. For myasthenia gravis 09/04/21   [provider]  olmesartan (BENICAR) 20 MG tablet Take 1 tablet (20 mg total) by mouth daily. 01/08/20   Angiulli, Mcarthur Rossetti, PA-C  ondansetron (ZOFRAN) 8 MG tablet TAKE 1 TABLET BY MOUTH EVERY 8  HOURS AS NEEDED FOR NAUSEA AND  VOMITING 08/31/23   Carlean Jews, NP  predniSONE (DELTASONE) 2.5 MG tablet Take 7.5 mg by mouth every morning. For myasthenia gravis    [provider]  pregabalin (LYRICA) 25 MG capsule Take 1 capsule (25 mg total) by mouth 2 (two) times daily. 07/28/23   Pollyann Samples, NP  PRESCRIPTION MEDICATION Pt uses a cpap nightly    [provider]  PROAIR HFA 108 (90 BASE) MCG/ACT inhaler Inhale 2 puffs into the lungs every 6 (six) hours as needed for wheezing or shortness of breath.  06/02/13   [provider]  prochlorperazine (COMPAZINE) 10 MG tablet Take 1 tablet (10 mg total) by mouth every 6 (six) hours as needed for nausea or vomiting. 10/07/22   Malachy Mood, MD  riTUXimab (RITUXAN) 500 MG/50ML injection Inject into the vein every 6 (six) months.    [provider]  rosuvastatin (CRESTOR) 40 MG tablet Take 1 tablet (40 mg total) by mouth daily. 02/13/21 04/22/24  Yates Decamp, MD  tamoxifen (NOLVADEX) 20 MG tablet Take 1 tablet (20 mg total) by mouth daily. 06/07/23   Malachy Mood, MD  topiramate (TOPAMAX) 50 MG tablet TAKE 1 TABLET BY MOUTH AT  BEDTIME 10/04/23   Ihor Austin, NP  vitamin B-12 (CYANOCOBALAMIN) 1000 MCG tablet Take 1,000 mcg by mouth once a week.    [provider]  Vitamin D, Ergocalciferol, (DRISDOL) 50000 UNITS CAPS capsule Take 50,000 Units by mouth every Monday.  12/12/13   [provider]    Physical Exam: Vitals:   11/05/23 1610 11/05/23 9604 11/05/23 0430 11/05/23 0503  BP: (!) 160/68 (!) 107/55 (!) 113/50 (!) 118/52  Pulse: (!) 117 97 94 94  Resp: 17 18 18    Temp:  98.6 F (37 C) 98.6 F (37 C)   TempSrc:    Oral   SpO2:  92% 95% 97%  Weight:      Height:       Constitutional: Elderly female currently in NAD Eyes: PERRL, lids and conjunctivae normal ENMT: Mucous membranes are moist. Normal dentition.  Neck: normal, supple  Respiratory: clear to auscultation bilaterally, no wheezing, no crackles. Normal respiratory effort. No accessory muscle use.  Cardiovascular: Regular rate and rhythm, no murmurs / rubs / gallops.  Abdomen: no tenderness, no masses palpated. Bowel sounds positive.  Musculoskeletal: no clubbing / cyanosis. No joint deformity upper and lower extremities. Normal muscle tone.  Skin: no rashes, lesions, ulcers. No induration  Data Reviewed:   reviewed labs, imaging, and pertinent records as documented   Family Communication: Updated at bedside Primary team communication:  Thank you very much for involving [place] in the care of your patient.  Author: Clydie Braun, MD 11/05/2023 9:51 AM  For on call review [email or web address].

## 2023-11-05 NOTE — Progress Notes (Signed)
 Dr. Berline Chough was called at 05:15, reviewed events of the night.  We discuss the + D Dimer Results Reviewed Lab work  Lactic Acid, UA and Urine Culture ordered.  CT Chest with contrast , [name] has a allergy to Contrast, Venous Dopplers for work up VS VQ Scan and CT Chest will leave decision to the attending. Will send a message to Dr Marijean Niemann regarding the above.

## 2023-11-05 NOTE — Progress Notes (Signed)
 Spoke with Dr [name] regarding [name], she was discharged from acute care on 11/02/2023, Dr. Randol [name] stated she could resume her home meds of Olmesartan.  Dr. [name] recommended a lower dose.  Also recommended checking HGB, TSH and D-dimer.  Pharmacy was called: spoke with pharmacist. Olmesartan not available, Irbesartan 75 mg ordered , pharmacist states it is equal to Olmesartan 10 mg. X1. Dr Carlis Abbott can decide if Irbesartan will be continued.  Spoke with [name] Mering RN, regarding the above, she verbalizes understanding.

## 2023-11-05 NOTE — Progress Notes (Signed)
 Received update for the last few hours on [name], she remains tachycardic, she received IV Bolus . She denies any chest pain, dizziness or SOB. No distress noted. Medication list was reviewed, her home medication Jari Sportsman has not been resumed. Patient stated she was on Benicar pre-hospitalization..  Call placed to hospitalist to review the above. Dr. Toniann Fail.

## 2023-11-05 NOTE — Progress Notes (Signed)
 Changed Omnipod.  Omnipod Lot - # [id number].

## 2023-11-05 NOTE — Progress Notes (Signed)
 Having loose stools through out the afternoon and early evening hours. Attempting to eat chicken noodle soup earlier. Prior having small bites of macaroni and cheese. For dinner having creamy potato soup and tuna sandwich. Encouraged patient to simplify food items encouraged toast, apple sauce, and meat broth.

## 2023-11-05 NOTE — Progress Notes (Signed)
 Physical Therapy Cancellation Note  Patient Details  Name: [name] MRN: [id number] Date of Birth: [birth year] Today's Date: 11/05/2023  MD ordering to hold therapy today due to medial concerns. Therefore pt missed 120 minutes of skilled physical therapy. Will attempt to make up missed time as pt becomes medically appropriate.    Marlana Salvage Zaunegger Blima Rich PT, DPT 11/05/2023, 12:20 PM

## 2023-11-05 NOTE — Progress Notes (Signed)
 Occupational Therapy Session Note  Patient Details  Name: [name] MRN: [id number] Date of Birth: [birth year]  Today's Date: 11/05/2023 OT Individual Time: 6295-2841 OT Individual Time Calculation (min): 38 min   Today's Date: 11/05/2023 OT Missed Time: 7 Minutes Missed Time Reason: Patient ill (comment) (nausea) &  OT missed time: 30 min Missed time reason: MD hold    Short Term Goals: Week 1:  OT Short Term Goal 1 (Week 1): Pt will complete sit > stand with CGA OT Short Term Goal 2 (Week 1): Pt will complete toilet transfer with CGA using LRAD OT Short Term Goal 3 (Week 1): Pt will donn LB clothing supervision using AE PRN  Skilled Therapeutic Interventions/Progress Updates:  Session 1 Skilled OT intervention completed with focus on activity tolerance, RUE NMR. Pt received upright in bed. Pt reported a very poor night with active emesis all night and minimal sleep. Pt reported continued severe nausea and overall weakness. No pain reported. Care team notified of status, with request for nausea intervention however pt not due for meds until 10.   OT retrieved ice chips, diet ginger ale and cool wash cloth. Encouraged pt to crunch/suck on ice chips, which pt reported helped only a little bit. Pt washed face with set up A.   Pt politely declined OOB mobility as any bodily movement induces vomiting, but requested to brush teeth and do bed level. Cued pt to use RUE weaker hand as stabilizer for holding toothpaste and using LUE strong hand for FM tasks. Discussed how this can translate into other ADLs. No assist to brush teeth other than handing rinse/spit cup.   Pt engaged in the following RUE NMR tasks using item that pt is familiar with in functional tasks: -reaching for wash cloth with RUE, and using both hands to fold wash cloth to promote NMR and bilateral integration -Reaching cross body for wash cloth on L side of body with RUE, then transferring to R side for increased R sided  attention and NMR OT did not observe any strength/functional deficits in RUE during task from last therapy session however pt reports increased weakness.  Pt politely declined further therapy due to worsening symptoms. Nurse aware and at bedside for vitals. Pt remained upright in bed with direct care hand off to nursing at end of session. Pt missed 7 mins of OT intervention secondary to nausea; OT will make up missed time as able.   Session 2  Due pt status, MD placed pt on hold for all therapies for rest of day. OT will make up missed time as medical stability and schedule allows.    Therapy Documentation Precautions:  Precautions Precautions: Fall Precaution/Restrictions Comments: R hemiparesis Restrictions Weight Bearing Restrictions Per Provider Order: No    Therapy/Group: Individual Therapy  Melvyn Novas, MS, OTR/L  11/05/2023, 7:58 AM

## 2023-11-06 DIAGNOSIS — R6510 Systemic inflammatory response syndrome (SIRS) of non-infectious origin without acute organ dysfunction: Secondary | ICD-10-CM | POA: Diagnosis present

## 2023-11-06 DIAGNOSIS — D72829 Elevated white blood cell count, unspecified: Secondary | ICD-10-CM | POA: Diagnosis present

## 2023-11-06 DIAGNOSIS — E1165 Type 2 diabetes mellitus with hyperglycemia: Secondary | ICD-10-CM | POA: Diagnosis not present

## 2023-11-06 DIAGNOSIS — G8111 Spastic hemiplegia affecting right dominant side: Secondary | ICD-10-CM

## 2023-11-06 DIAGNOSIS — G7000 Myasthenia gravis without (acute) exacerbation: Secondary | ICD-10-CM | POA: Diagnosis not present

## 2023-11-06 DIAGNOSIS — R112 Nausea with vomiting, unspecified: Secondary | ICD-10-CM | POA: Diagnosis present

## 2023-11-06 DIAGNOSIS — I6350 Cerebral infarction due to unspecified occlusion or stenosis of unspecified cerebral artery: Secondary | ICD-10-CM | POA: Diagnosis not present

## 2023-11-06 LAB — CBC WITH DIFFERENTIAL/PLATELET
Abs Immature Granulocytes: 0.04 10*3/uL (ref 0.00–0.07)
Basophils Absolute: 0 10*3/uL (ref 0.0–0.1)
Basophils Relative: 1 %
Eosinophils Absolute: 0.1 10*3/uL (ref 0.0–0.5)
Eosinophils Relative: 1 %
HCT: 31.7 % — ABNORMAL LOW (ref 36.0–46.0)
Hemoglobin: 10.2 g/dL — ABNORMAL LOW (ref 12.0–15.0)
Immature Granulocytes: 1 %
Lymphocytes Relative: 13 %
Lymphs Abs: 1 10*3/uL (ref 0.7–4.0)
MCH: 30.9 pg (ref 26.0–34.0)
MCHC: 32.2 g/dL (ref 30.0–36.0)
MCV: 96.1 fL (ref 80.0–100.0)
Monocytes Absolute: 0.9 10*3/uL (ref 0.1–1.0)
Monocytes Relative: 11 %
Neutro Abs: 5.9 10*3/uL (ref 1.7–7.7)
Neutrophils Relative %: 73 %
Platelets: 240 10*3/uL (ref 150–400)
RBC: 3.3 MIL/uL — ABNORMAL LOW (ref 3.87–5.11)
RDW: 12.7 % (ref 11.5–15.5)
WBC: 8 10*3/uL (ref 4.0–10.5)
nRBC: 0 % (ref 0.0–0.2)

## 2023-11-06 LAB — RESP PANEL BY RT-PCR (RSV, FLU A&B, COVID)  RVPGX2
Influenza A by PCR: NEGATIVE
Influenza B by PCR: NEGATIVE
Resp Syncytial Virus by PCR: NEGATIVE
SARS Coronavirus 2 by RT PCR: NEGATIVE

## 2023-11-06 LAB — BASIC METABOLIC PANEL
Anion gap: 9 (ref 5–15)
BUN: 16 mg/dL (ref 8–23)
CO2: 19 mmol/L — ABNORMAL LOW (ref 22–32)
Calcium: 8.2 mg/dL — ABNORMAL LOW (ref 8.9–10.3)
Chloride: 110 mmol/L (ref 98–111)
Creatinine, Ser: 1.05 mg/dL — ABNORMAL HIGH (ref 0.44–1.00)
GFR, Estimated: 57 mL/min — ABNORMAL LOW (ref >60)
Glucose, Bld: 133 mg/dL — ABNORMAL HIGH (ref 70–99)
Potassium: 3.5 mmol/L (ref 3.5–5.1)
Sodium: 138 mmol/L (ref 135–145)

## 2023-11-06 LAB — GLUCOSE, CAPILLARY
Glucose-Capillary: 173 mg/dL — ABNORMAL HIGH (ref 70–99)
Glucose-Capillary: 236 mg/dL — ABNORMAL HIGH (ref 70–99)

## 2023-11-06 MED ORDER — CHLORHEXIDINE GLUCONATE CLOTH 2 % EX PADS
6.0000 | MEDICATED_PAD | Freq: Every day | CUTANEOUS | Status: DC
  Administered 2023-11-06 – 2023-11-08 (×3): 6 via TOPICAL

## 2023-11-06 NOTE — Progress Notes (Signed)
 Throughout the morning encouraging fluids for patient. Does endorse lightheadedness intermittently throughout the day. Experiencing more with ambulation. Does not express any weakness with lower extremities.

## 2023-11-06 NOTE — Progress Notes (Signed)
 PROGRESS NOTE   Subjective/Complaints:  Mild SOB with activity no cough wheezing or fever  Amb to BR with CNA , had BM this am and feels better  No PE/DVT noted on imaging   ROS: +R side weakness and numbness , +constipation, -emesis  Objective:   NM Pulmonary Perfusion Result Date: 11/05/2023 CLINICAL DATA:  Chest pain, nonspecific elevated D Dimer EXAM: NUCLEAR MEDICINE PERFUSION LUNG SCAN TECHNIQUE: Perfusion images were obtained in multiple projections after intravenous injection of radiopharmaceutical. Ventilation scans intentionally deferred if perfusion scan and chest x-ray adequate for interpretation during COVID 19 epidemic. RADIOPHARMACEUTICALS:  4.0 mCi Tc-89m MAA IV COMPARISON:  Chest x-ray 11/05/2023 FINDINGS: Homogeneous distribution of radiotracer throughout both lungs. No perfusion defects. IMPRESSION: Normal exam.  No scintigraphic evidence for pulmonary embolism. Electronically Signed   By: Duanne Guess D.O.   On: 11/05/2023 15:23   CT HEAD WO CONTRAST ( ) Result Date: 11/05/2023 CLINICAL DATA:  73 year old female neurologic deficit. Code stroke presentation on 10/29/2023 with small dorsal pontine lacunar infarct at that time. EXAM: CT HEAD WITHOUT CONTRAST TECHNIQUE: Contiguous axial images were obtained from the base of the skull through the vertex without intravenous contrast. RADIATION DOSE REDUCTION: This exam was performed according to the departmental dose-optimization program which includes automated exposure control, adjustment of the mA and/or kV according to patient size and/or use of iterative reconstruction technique. COMPARISON:  Brain MRI and head CT 10/29/2023 FINDINGS: Brain: Cerebral volume remains normal for age. No midline shift, mass effect, or evidence of intracranial mass lesion. No ventriculomegaly. No acute intracranial hemorrhage identified. Subtle hypodensity now in the dorsal right pons by CT  on series 3, image 11. Otherwise Stable gray-white matter differentiation throughout the brain. No cortically based acute infarct identified. Chronic heterogeneity in the thalami. Vascular: No suspicious intracranial vascular hyperdensity. Calcified atherosclerosis at the skull base. Skull: No acute osseous abnormality identified. Sinuses/Orbits: Chronic left sphenoid sinus disease, right maxillary sinus retention cysts. Tympanic cavities and mastoids remain well aerated. Other: No acute orbit or scalp soft tissue finding. IMPRESSION: No acute intracranial abnormality. Expected CT appearance of the recent dorsal right pontine infarct and other chronic small vessel disease. Electronically Signed   By: Odessa Fleming M.D.   On: 11/05/2023 13:20   VAS [place] LOWER EXTREMITY VENOUS (DVT) Result Date: 11/05/2023  Lower Venous DVT Study Patient Name:  [name]  Date of Exam:   11/05/2023 Medical Rec #: [id number]        Accession #:    [id number] Date of Birth: Feb 18, 1951         Patient Gender: F Patient Age:   73 years Exam Location:  [facility] Procedure:      VAS [place] LOWER EXTREMITY VENOUS (DVT) Referring Phys: PAMELA LOVE --------------------------------------------------------------------------------  Indications: Stroke, and Elevated D-dimer, right face, arm and leg numbness. Other Indications: Thyroid cancer, left TIA, left infarct. Limitations: Calcification in adjacent arteries. Comparison Study: Previous study on 4.28.2021. Performing Technologist: Fernande Bras  Examination Guidelines: A complete evaluation includes B-mode imaging, spectral Doppler, color Doppler, and power Doppler as needed of all accessible portions of each vessel. Bilateral testing is considered an integral part of a  complete examination. Limited examinations for reoccurring indications may be performed as noted. The reflux portion of the exam is performed with the patient in reverse Trendelenburg.   +---------+---------------+---------+-----------+----------+--------------+ RIGHT    CompressibilityPhasicitySpontaneityPropertiesThrombus Aging +---------+---------------+---------+-----------+----------+--------------+ CFV      Full           Yes      Yes                                 +---------+---------------+---------+-----------+----------+--------------+ SFJ      Full           Yes      Yes                                 +---------+---------------+---------+-----------+----------+--------------+ FV Prox  Full                                                        +---------+---------------+---------+-----------+----------+--------------+ FV Mid   Full                                                        +---------+---------------+---------+-----------+----------+--------------+ FV DistalFull                                                        +---------+---------------+---------+-----------+----------+--------------+ PFV      Full                                                        +---------+---------------+---------+-----------+----------+--------------+ POP      Full           Yes      Yes                                 +---------+---------------+---------+-----------+----------+--------------+ PTV      Full                                                        +---------+---------------+---------+-----------+----------+--------------+ PERO     Full                                                        +---------+---------------+---------+-----------+----------+--------------+   +---------+---------------+---------+-----------+----------+--------------+ LEFT     CompressibilityPhasicitySpontaneityPropertiesThrombus Aging +---------+---------------+---------+-----------+----------+--------------+ CFV      Full  Yes      Yes                                  +---------+---------------+---------+-----------+----------+--------------+ SFJ      Full           Yes      Yes                                 +---------+---------------+---------+-----------+----------+--------------+ FV Prox  Full                                                        +---------+---------------+---------+-----------+----------+--------------+ FV Mid   Full                                                        +---------+---------------+---------+-----------+----------+--------------+ FV DistalFull                                                        +---------+---------------+---------+-----------+----------+--------------+ PFV      Full                                                        +---------+---------------+---------+-----------+----------+--------------+ POP      Full           Yes      Yes                                 +---------+---------------+---------+-----------+----------+--------------+ PTV      Full                                                        +---------+---------------+---------+-----------+----------+--------------+ PERO     Full                                                        +---------+---------------+---------+-----------+----------+--------------+    Summary: BILATERAL: - No evidence of deep vein thrombosis seen in the lower extremities, bilaterally. -No evidence of popliteal cyst, bilaterally.   *See table(s) above for measurements and observations.    Preliminary    DG Chest 2 View Result Date: 11/05/2023 CLINICAL DATA:  Follow-up.  Cough and nausea and vomiting. EXAM: CHEST - 2 VIEW COMPARISON:  09/16/2021 FINDINGS: There is a right chest wall port a catheter with tip at  the superior cavoatrial junction. Heart size and mediastinal contours appear normal. No pleural fluid, interstitial edema or airspace disease. Visualized osseous structures are unremarkable. IMPRESSION: No acute  cardiopulmonary disease. Electronically Signed   By: Signa Kell M.D.   On: 11/05/2023 07:26   DG Abd 1 View Result Date: 11/04/2023 CLINICAL DATA:  Nausea and vomiting. EXAM: ABDOMEN - 1 VIEW COMPARISON:  None Available. FINDINGS: No bowel dilatation to suggest obstruction. Small to moderate formed stool in the colon. No radiopaque calculi. Right upper quadrant surgical clips, typically from cholecystectomy. No evidence of free air in the supine view. IMPRESSION: Nonobstructive bowel gas pattern. Electronically Signed   By: Narda Rutherford M.D.   On: 11/04/2023 15:39   Recent Labs    11/05/23 0202  WBC 12.6*  HGB 10.6*  HCT 32.1*  PLT 220   Recent Labs    11/05/23 0202  NA 138  140  K 2.9*  2.9*  CL 104  108  CO2 22  22  GLUCOSE 132*  131*  BUN 17  16  CREATININE 1.07*  0.99  CALCIUM 8.2*  8.4*   No intake or output data in the 24 hours ending 11/06/23 0720       Physical Exam: Vital Signs Blood pressure (!) 140/65, pulse 79, temperature 98.2 F (36.8 C), temperature source Oral, resp. rate 18, height 5\' 3"  (1.6 m), weight 86.2 kg, SpO2 100%.  General: No acute distress Mood and affect are appropriate Heart: Regular rate and rhythm no rubs murmurs or extra sounds Lungs: Clear to auscultation, breathing unlabored, no rales or wheezes Abdomen: Positive bowel sounds, soft nontender to palpation, nondistended Extremities: No clubbing, cyanosis, or edema Skin: No evidence of breakdown, no evidence of rash      Musculoskeletal:        General: Normal range of motion.     Cervical back: Neck supple. No tenderness.     Comments:  RUE- 3-/5  LUE- 5/5  RLE- 3-/5  LLE-5/5  Neurological:     Mental Status: She is alert and oriented to person, place, and time.  Reduced sensation LT on right side including face    Psychiatric:        Mood and Affect: Mood normal.        Behavior: Behavior normal.   Assessment/Plan: 1. Functional deficits which require 3+  hours per day of interdisciplinary therapy in a comprehensive inpatient rehab setting. Physiatrist is providing close team supervision and 24 hour management of active medical problems listed below. Physiatrist and rehab team continue to assess barriers to discharge/monitor patient progress toward functional and medical goals  Care Tool:  Bathing    Body parts bathed by patient: Right arm, Left arm, Chest, Abdomen, Right upper leg, Left upper leg, Face, Right lower leg, Left lower leg   Body parts bathed by helper: Front perineal area, Buttocks     Bathing assist Assist Level: Moderate Assistance - Patient 50 - 74%     Upper Body Dressing/Undressing Upper body dressing   What is the patient wearing?: Pull over shirt    Upper body assist Assist Level: Moderate Assistance - Patient 50 - 74%    Lower Body Dressing/Undressing Lower body dressing      What is the patient wearing?: Pants     Lower body assist Assist for lower body dressing: Moderate Assistance - Patient 50 - 74%     Toileting Toileting    Toileting assist Assist for toileting: Moderate Assistance - Patient  50 - 74%     Transfers Chair/bed transfer  Transfers assist  Chair/bed transfer activity did not occur: Safety/medical concerns (nausea, lethargy)  Chair/bed transfer assist level: Minimal Assistance - Patient > 75%     Locomotion Ambulation   Ambulation assist   Ambulation activity did not occur: Safety/medical concerns (nausea, lethargy)  Assist level: Minimal Assistance - Patient > 75% Assistive device: Walker-rolling Max distance: 44ft   Walk 10 feet activity   Assist  Walk 10 feet activity did not occur: Safety/medical concerns (nausea, lethargy)  Assist level: Minimal Assistance - Patient > 75% Assistive device: Walker-rolling   Walk 50 feet activity   Assist Walk 50 feet with 2 turns activity did not occur: Safety/medical concerns (nausea, lethargy)  Assist level: Minimal  Assistance - Patient > 75% Assistive device: Walker-rolling    Walk 150 feet activity   Assist Walk 150 feet activity did not occur: Safety/medical concerns (nausea, lethargy)         Walk 10 feet on uneven surface  activity   Assist Walk 10 feet on uneven surfaces activity did not occur: Safety/medical concerns (nausea, lethargy)         Wheelchair     Assist Is the patient using a wheelchair?: Yes Type of Wheelchair: Manual Wheelchair activity did not occur: Safety/medical concerns (nausea, lethargy)         Wheelchair 50 feet with 2 turns activity    Assist    Wheelchair 50 feet with 2 turns activity did not occur: Safety/medical concerns (nausea, lethargy)       Wheelchair 150 feet activity     Assist  Wheelchair 150 feet activity did not occur: Safety/medical concerns (nausea, lethargy)       Blood pressure (!) 140/65, pulse 79, temperature 98.2 F (36.8 C), temperature source Oral, resp. rate 18, height 5\' 3"  (1.6 m), weight 86.2 kg, SpO2 100%. Medical Problem List and Plan: 1. 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  Antithrombotics: -DVT/anticoagulation:  Pharmaceutical: Lovenox             -antiplatelet therapy: Aspirin and Plavix for three weeks followed by Plavix alone (has ICA stent)   3. Pain Management: Tylenol as needed             -Lyrica 75 mg BID   4. Mood/Behavior/Sleep: LCSW to evaluate and provide emotional support             -antipsychotic agents: n/a   5. Neuropsych/cognition: This patient is capable of making decisions on her own behalf.   6. Skin/Wound Care: Routine skin care checks   7. 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 Nausea: thought to be 2/2 constipation but has not resolved with constipation management- patient now having BM, improved later in the day  20. Right upper extremity pain: VAS [place] ordered to assess for clot, discussed that this was negative  21. Emesis: continue IV zofran, medicine consulted for their opinion, thyroid labs were ordered last night and are abnormal, improved later in the day  22. Chest pain/tachycardia: resolved neg V/Q scan, EKG neg  23. Hypokalemia: kdur 40 BID ordered x2 doses on 3/14   24.  Mild DOE, no cough or fever, resp panel pending, await result   LOS: 4 days A FACE TO FACE EVALUATION WAS PERFORMED  Erick Colace 11/06/2023, 7:20 AM

## 2023-11-06 NOTE — Consult Note (Signed)
 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 Ultrasound of the carotids noted right ICA gnosis at 40 to 59% and patent stent of the left internal carotid Placed on aspirin and Plavix for 3 weeks then Plavix alone Transferred to CIR for further rehab   SIRS  Patient was noted to be tachycardic overnight with WBC elevated up to 12.6 Procalcitonin was noted to be less than 0.1 Chest x-ray showed no acute abnormality Urinalysis did not show significant signs for infection Possibly reactive due to nausea and vomiting  Dizziness Residual symptom this AM Possible orthostasis, will check Renal function is at baseline She did have 5 ketones on UA yesterday so suspect mild hypovolemia that is likely to continue to improve with ongoing improved PO intake   Nausea and vomiting Patient reports having nausea and vomiting unable to keep much food or liquids down in the setting of constipation Abdominal x-ray 3/13 done yesterday did not show any signs of obstruction, but small to moderate stool in colon Patient had been treated with stool softeners and subsequently had diarrhea with nausea and vomiting This issue appears to have been resolved with treatment of constipation   Elevated D-dimer D-dimer was noted to be elevated at 0.89 Doppler ultrasound of the lower extremities subsequent VQ scan negative.   Prolonged QT interval EKG revealed a normal sinus rhythm at 94 bpm with right bundle branch block and QTc 502.   Recheck EKG is pending   Hypokalemia Potassium noted to be acutely low at 2.9 Repleted and resolved Likely related to GI losses as above    Uncontrolled diabetes mellitus type 2, with long-term use of insulin Patient on insulin pump and Jardiance at home Hemoglobin A1c was noted to be 9.2, poor control Continue insulin pump  Diabetic education consulted to assist   Hypertension Continue current medication regimen   Ocular myasthenia gravis Patient followed by [facility] in the outpatient setting Receives Rituxan and IVIG every 6 weeks Continue prednisone and CellCept    Hypothyroidism H/p thyroidectomy due to thyroid CA TSH noted to be 0.02 which appears to be improved from most recent check on 3/10 of 0.012 Records note Synthroid dose had been decreased from 200 mcg down to 100 mcg during her most recent hospitalization Continue current dose of levothyroxine Recommend rechecking TSH in 4 weeks given recent dose adjustment/following up with endocrinology   Normocytic anemia Hgb appears around baseline   Hyperlipidemia Continue Crestor, Zetia, and Repatha   History of breast cancer Continue tamoxifen    TRH will continue to follow the patient.  Likely sign off tomorrow if she is continuing to improve.    HPI: [name] is a 73 y.o. female with past medical history of HTN, HLD, CVA, DM on insulin pump, myasthenia gravis, OSA, and thyroid/breast cancers s/p treatment who presented on 3/7 with acute CVA.  She was discharged to CIR on 3/11.  She reports feeling overall better, mildly lightheaded with position changes.  Review of Systems: As mentioned in the history of present illness. All other systems reviewed and are negative. Past Medical History:  Diagnosis Date   Arthritis    "all over my body" (03/13/2013)   Asthma  Asymptomatic carotid artery stenosis, bilateral 10/08/2018   Breast cancer (HCC)    GERD (gastroesophageal reflux disease)    H/O hiatal hernia    Headache    pt states she has had headaches for about 6 months "off and on"   Heart murmur    pt had echocardiogram on 09/17/21    Hypercholesterolemia 10/08/2018   Hypertension    Hypothyroidism    Myasthenia gravis (HCC)    "in my eyes; diagnsosed > 7 yr ago" (03/13/2013)   Sleep apnea    on CPAP   Stroke (HCC) 2021   Thyroid carcinoma (HCC)    Type II diabetes mellitus (HCC)    Past Surgical History:  Procedure Laterality Date   ABDOMINAL HYSTERECTOMY     ANTERIOR CERVICAL DECOMP/DISCECTOMY FUSION     "I've had severa ORs; always went in from the front" (03/13/2013)   APPENDECTOMY     BREAST LUMPECTOMY WITH RADIOACTIVE SEED AND SENTINEL LYMPH NODE BIOPSY Left 09/02/2022   Procedure: LEFT BREAST LUMPECTOMY WITH RADIOACTIVE SEED AND SENTINEL LYMPH NODE BIOPSY;  Surgeon: Abigail Miyamoto, MD;  Location: [facility] OR;  Service: General;  Laterality: Left;   CARDIAC CATHETERIZATION     "several" (03/13/2013)   CARPAL TUNNEL RELEASE Right    CATARACT EXTRACTION W/ INTRAOCULAR LENS  IMPLANT, BILATERAL Bilateral    CHOLECYSTECTOMY     KNEE ARTHROSCOPY Left    SHOULDER ARTHROSCOPY W/ ROTATOR CUFF REPAIR Left    TONSILLECTOMY     TOTAL THYROIDECTOMY     TRANSCAROTID ARTERY REVASCULARIZATION  Left 09/24/2021   Procedure: LEFT TRANSCAROTID ARTERY REVASCULARIZATION;  Surgeon: Leonie Douglas, MD;  Location: [facility] OR;  Service: Vascular;  Laterality: Left;   TRANSCAROTID ARTERY REVASCULARIZATION  Right 10/23/2021   Procedure: Right Transcarotid Artery Revascularization;  Surgeon: Leonie Douglas, MD;  Location: [facility] OR;  Service: Vascular;  Laterality: Right;   ULTRASOUND GUIDANCE FOR VASCULAR ACCESS Right 09/24/2021   Procedure: ULTRASOUND GUIDANCE FOR VASCULAR ACCESS;  Surgeon: Leonie Douglas, MD;  Location: [facility] OR;  Service: Vascular;  Laterality: Right;   ULTRASOUND GUIDANCE FOR VASCULAR ACCESS Right 10/23/2021   Procedure: ULTRASOUND GUIDANCE FOR VASCULAR ACCESS, LEFT FEMORAL VEIN;  Surgeon: Leonie Douglas, MD;  Location: [facility] OR;  Service: Vascular;  Laterality: Right;   Social History:  reports that she has never smoked.  She has never used smokeless tobacco. She reports that she does not drink alcohol and does not use drugs.  Allergies  Allergen Reactions   Contrast Media [Iodinated Contrast Media] Anaphylaxis    01/10/15 and 09/18/2021 --PT GIVEN 13 HR PRE MEDS FOR CT with contrast TOLERATED IV CONTRAST W/O ANY REACTION   Fluorescein Shortness Of Breath and Other (See Comments)    (Dye)   Iodine Anaphylaxis    Contrast dye - iodine   Magnesium-Containing Compounds Other (See Comments)    H/O myasthenia gravis- caution replacing IV    Molds & Smuts Shortness Of Breath and Other (See Comments)    Congestion and wheezing, also   Shellfish-Derived Products Anaphylaxis, Shortness Of Breath, Swelling and Other (See Comments)    Welts, also   Pholcodine Hives   Azithromycin Nausea Only   Codeine Hives and Nausea Only   Metformin Hcl Er Nausea Only   Oxycodone Nausea Only    Nausea 08/2022 admission.   Tramadol Hcl Nausea Only   Other Rash and Other (See Comments)    Coban causes welts, also    Family History  Problem Relation Age of  Onset   Coronary artery disease Sister        s/p coronary stenting   Stroke Sister 39   Diabetes Sister    Congestive Heart Failure Mother    Asthma Mother    Diabetes Mother    Congestive Heart Failure Father    Lung cancer Father    Asthma Father    Diabetes Father    Diabetes Brother     Prior to Admission medications   Medication Sig Start Date End Date Taking? Authorizing Provider  acetaminophen (TYLENOL) 500 MG tablet Take 500 mg by mouth every 6 (six) hours as needed for moderate pain.    [provider]  aspirin EC 81 MG tablet Take 1 tablet (81 mg total) by mouth daily for 18 days. 11/02/23 11/20/23  Elgergawy, Leana Roe, MD  clopidogrel (PLAVIX) 75 MG tablet Take 1 tablet (75 mg total) by mouth daily. 11/03/23   Elgergawy, Leana Roe, MD  diphenoxylate-atropine (LOMOTIL) 2.5-0.025 MG tablet Take 1-2 tablets by mouth 4 (four) times daily as needed for  diarrhea or loose stools. 10/28/22   Pollyann Samples, NP  ELDERBERRY PO Take 1 tablet by mouth daily.    [provider]  empagliflozin (JARDIANCE) 25 MG TABS tablet Take 25 mg by mouth daily.    [provider]  esomeprazole (NEXIUM) 40 MG capsule Take 1 capsule (40 mg total) by mouth 2 (two) times daily. 01/08/20   Angiulli, Mcarthur Rossetti, PA-C  Evolocumab with Infusor (REPATHA PUSHTRONEX SYSTEM) 420 MG/3.5ML SOCT Inject 420 mg into the skin every 30 (thirty) days.    [provider]  Exenatide ER (BYDUREON BCISE) 2 MG/0.85ML AUIJ Inject 2 mg into the skin every Monday.    [provider]  ezetimibe (ZETIA) 10 MG tablet TAKE 1 TABLET BY MOUTH EVERY DAY 01/16/21   Arantxa Piercey Decamp, MD  Immune Globulin, Human, 40 GM/400ML SOLN Inject into the vein every 6 (six) weeks. Infusions are administered at home by home health nurse - last infusion 09/08/21 and 09/09/21; next infusion due 10/19/21 and 10/20/21 02/21/20   [provider]  insulin lispro (HUMALOG) 100 UNIT/ML injection Inject into the skin See admin instructions. Inject subcutaneously 3 (three) times daily with meals With meals through the Omnipod Dash Pump every 72 hours as directed    [provider]  levothyroxine (SYNTHROID) 100 MCG tablet Take 1 tablet (100 mcg total) by mouth daily at 6 (six) AM. 11/03/23   Elgergawy, Leana Roe, MD  lidocaine-prilocaine (EMLA) cream Apply to affected area once 10/15/22   Malachy Mood, MD  loperamide (IMODIUM A-D) 2 MG tablet Take 2 mg by mouth 4 (four) times daily as needed for diarrhea or loose stools.    [provider]  loratadine (CLARITIN) 10 MG tablet Take 1 tablet (10 mg total) by mouth at bedtime. 01/08/20   Angiulli, Mcarthur Rossetti, PA-C  meclizine (ANTIVERT) 25 MG tablet Take 25 mg by mouth 3 (three) times daily as needed for dizziness or nausea (or migraine-related nausea). 06/27/13   [provider]  montelukast (SINGULAIR) 10 MG tablet Take 1 tablet (10 mg  total) by mouth every morning. 01/08/20   Angiulli, Mcarthur Rossetti, PA-C  mycophenolate (CELLCEPT) 500 MG tablet Take 1,000 mg by mouth 2 (two) times daily. For myasthenia gravis 09/04/21   [provider]  olmesartan (BENICAR) 20 MG tablet Take 1 tablet (20 mg total) by mouth daily. 01/08/20   Angiulli, Mcarthur Rossetti, PA-C  ondansetron (ZOFRAN) 8 MG tablet  TAKE 1 TABLET BY MOUTH EVERY 8  HOURS AS NEEDED FOR NAUSEA AND  VOMITING 08/31/23   Carlean Jews, NP  predniSONE (DELTASONE) 2.5 MG tablet Take 7.5 mg by mouth every morning. For myasthenia gravis    [provider]  pregabalin (LYRICA) 25 MG capsule Take 1 capsule (25 mg total) by mouth 2 (two) times daily. 07/28/23   Pollyann Samples, NP  PRESCRIPTION MEDICATION Pt uses a cpap nightly    [provider]  PROAIR HFA 108 (90 BASE) MCG/ACT inhaler Inhale 2 puffs into the lungs every 6 (six) hours as needed for wheezing or shortness of breath.  06/02/13   [provider]  prochlorperazine (COMPAZINE) 10 MG tablet Take 1 tablet (10 mg total) by mouth every 6 (six) hours as needed for nausea or vomiting. 10/07/22   Malachy Mood, MD  riTUXimab (RITUXAN) 500 MG/50ML injection Inject into the vein every 6 (six) months.    [provider]  rosuvastatin (CRESTOR) 40 MG tablet Take 1 tablet (40 mg total) by mouth daily. 02/13/21 04/22/24  Nikky Duba Decamp, MD  tamoxifen (NOLVADEX) 20 MG tablet Take 1 tablet (20 mg total) by mouth daily. 06/07/23   Malachy Mood, MD  topiramate (TOPAMAX) 50 MG tablet TAKE 1 TABLET BY MOUTH AT  BEDTIME 10/04/23   Ihor Austin, NP  vitamin B-12 (CYANOCOBALAMIN) 1000 MCG tablet Take 1,000 mcg by mouth once a week.    [provider]  Vitamin D, Ergocalciferol, (DRISDOL) 50000 UNITS CAPS capsule Take 50,000 Units by mouth every Monday.  12/12/13   [provider]    Physical Exam: Vitals:   11/05/23 1900 11/05/23 1953 11/06/23 0039 11/06/23 0406  BP: (!) 108/52 (!) 142/53 (!) 140/56 (!)  140/65  Pulse: (!) 103 (!) 103 92 79  Resp: 16 18 18 18   Temp: 98.4 F (36.9 C) 98.6 F (37 C) 98.5 F (36.9 C) 98.2 F (36.8 C)  TempSrc: Oral Oral Oral Oral  SpO2: 100% 96% 98% 100%  Weight:      Height:         Intake/Output Summary (Last 24 hours) at 11/06/2023 1158 Last data filed at 11/06/2023 0724 Gross per 24 hour  Intake 240 ml  Output --  Net 240 ml   Filed Weights   11/03/23 1405  Weight: 86.2 kg    Exam:  General:  Appears calm and comfortable and is in NAD, sitting up at bedside Eyes:  EOMI, normal lids, iris ENT:  grossly normal hearing, lips & tongue, mmm Cardiovascular:  RRR, no m/r/g. No LE edema.  Respiratory:   CTA bilaterally with no wheezes/rales/rhonchi.  Normal respiratory effort. Abdomen:  soft, NT, ND Skin:  no rash or induration seen on limited exam Musculoskeletal:  grossly normal tone BUE/BLE, good ROM, no bony abnormality Psychiatric:  grossly normal mood and affect, speech fluent and appropriate, AOx3 Neurologic:  CN 2-12 grossly intact, moves all extremities in coordinated fashion  Data Reviewed: I have reviewed the patient's lab results since admission.  Pertinent labs for today include:  CO2 19 Glucose 133 Anion gap 9 BUN 18/Creatinine 1.05/GFR 57, stable WBC 8 Hgb 10.2 COVID/flu/RSV negative    Family Communication: None; declined having me call family  Thank you very much for involving [place] in the care of your patient.  Author: Jonah Blue, MD 11/06/2023 11:58 AM  For on call review [email or web address].

## 2023-11-06 NOTE — Progress Notes (Signed)
 Physical Therapy Session Note  Patient Details  Name: [name] MRN: [id number] Date of Birth: Jul 15, 1951  Today's Date: 11/06/2023 PT Individual Time: 1115-1208; 1310 - 1408  PT Individual Time Calculation (min): 53 min; 58 min   Short Term Goals: Week 1:  PT Short Term Goal 1 (Week 1): pt will perform bed mobility with CGA overall PT Short Term Goal 2 (Week 1): pt will perform transfers with LRAD and CGA PT Short Term Goal 3 (Week 1): pt will ambulate 45ft with LRAD and CGA  SESSION 1 Skilled Therapeutic Interventions/Progress Updates: Patient sitting in WC on entrance to room. Patient alert and agreeable to PT session.   Patient reported HA (received various medications in morning and does not remember what all they were). Pt cleared for therapy today. Pt transported from room<>main gym for energy conservation/time management. Pt BP monitored due to reports of pt becoming light headed when standing. Vitals assessed sitting in WC and standing positions (see vitals below). Pt with mild reports of dizziness during first BP check, and asymptomatic when back in room following donning of knee high TED hoses (pt reported not being able to tolerate thigh highs in the past due to LE pain). NSG notified of findings along with covering MD (ordered TED's to be donned, and placed order for abdominals. Pt required increased time to monitor BP to get accurate reading (placed on R forearm). Pt performed standing marches in RW (performed sit to stands throughout session during vital check with CGA and VC for hand placement and anterior scoot) with CGA and VC to weight shift. Pt performed x 10 on L, and then until fatigue on R with pt increasing height of step on R after several reps. Pt ambulated from WC<toilet in bathroom with CGA and VC for weight shift to L to increase step clearance on R LE (pt doffed personal LB clothing with supervision. MD and nsg updated on vitals, along with afternoon OT and PT  sessions to follow up.  Patient sitting on toilet with handoff to attending nsg.  SESSION 2 Skilled Therapeutic Interventions/Progress Updates: Patient supine in bed on entrance to room. Patient alert and agreeable to PT session.   Patient reported 4/10 HA (RN notified).  Therapeutic Activity: Bed Mobility: Pt performed supine<sit on EOB with light minA for R LE advancement (HOB elevated). VC provided to avoid using UE to assist in R LE advancing off of bed, and to instead use musculature required to actively assist. Transfers: Pt performed sit<>stand pivot transfers throughout session with RW and with close supervision. Provided VC for pt to stay standing in RW prior to pivoting in case symptoms of dizziness arise (not issue this session).  Gait Training:  Pt ambulated 95' using RW with CGA. Pt demonstrated the following gait deviations with therapist providing the described cuing and facilitation for improvement:  - Decreased step length/clearance on R LE with VC to bring heel through swing to increase DF, and for pt to increase R hip flexion by bringing R knee towards bar as target cue. Pt with improved step length and ability to full R foot through without front of shoe dragging on floor (initial presentation)  - Decreased cadence. Pt ambulated roughly 100' from main gym<room at end of session (need rest break and transport back to room due to fatigue) with CGA and improved step length/clearance following NMRE. Pt cued to continue bringing R heel through swing phase, and to look forward ahead vs down at ground. Pt also  with decreased fluidity of R LE through swing. Increased cadence noted.  Neuromuscular Re-ed: NMR facilitated during session with focus on neuromuscular connection/coordination. - Contract-Contract of dorsiflexors and plantarflexors on R LE (pt short sitting edge of mat). Very light manual resistance applied at first with pt improving strength against resistance. Pt able to  achieve 3- MMT dorsiflexion following. Pt cued to think about muscle movement during. - Pt standing in RW with instructions to tap R heel to target on floor (colored dot).  - Progressed to 2" step with same cues to only tap step with R heel. Pt performed until close to fatigue and able to increase ROM of hip flexion with target cue to bring R knee to PTA hand  NMR performed for improvements in motor control and coordination, balance, sequencing, judgement, and self confidence/ efficacy in performing all aspects of mobility at highest level of independence.   Patient sitting in WC at end of session with brakes locked, and all needs within reach.       Therapy Documentation Precautions:  Precautions Precautions: Fall Precaution/Restrictions Comments: R hemiparesis Restrictions Weight Bearing Restrictions Per Provider Order: No  Vitals: 169/73 (98) 94 HR sitting; 137/84 (97) 99 HR standing 181/69 (94) 91 HR sitting; 143/57 (80) 97 HR standing (with knee high TED's donned B LE)  Therapy/Group: Individual Therapy  Mana Haberl PTA 11/06/2023, 12:22 PM

## 2023-11-06 NOTE — Progress Notes (Signed)
 Occupational Therapy Session Note  Patient Details  Name: [name] MRN: [id number] Date of Birth: 06-24-1951  Today's Date: 11/06/2023 OT Individual Time: 0922-1017 & 1450-1530 OT Individual Time Calculation (min): 55 min & 40 min   Short Term Goals: Week 1:  OT Short Term Goal 1 (Week 1): Pt will complete sit > stand with CGA OT Short Term Goal 2 (Week 1): Pt will complete toilet transfer with CGA using LRAD OT Short Term Goal 3 (Week 1): Pt will donn LB clothing supervision using AE PRN  Skilled Therapeutic Interventions/Progress Updates:  Session 1 Skilled OT intervention completed with focus on ADL retraining, activity tolerance, functional mobility. Pt received upright in bed, agreeable to session. No pain reported. Pt verbalized improved nausea symptoms however continued dizziness with mobility; see vitals below as monitored during session.  Transitioned to EOB with + time, overall min A for RLE management. CGA sit > stand RW then CGA ambulatory transfer RW > toilet with RLE dragging during ambulation but pt able to clear with + time. Pt was continent of urinary void and loose (type 7) BM; nurse made aware. Independent with pericare seated. CGA sit > stand and CGA for balance during donning of underwear over hips. CGA stand pivot with grab bar > w/c.   Due to dizziness and fatigue pt requested to do sponge bathing vs full shower. Seated at sink pt completed UB bathing with set up A, dressing including bra with mod A with cues needed for hemi technique, LB dressing with min A at the sit > stand level and donned shoes with mod A. Breaks needed due to fatigue and dizziness. Pt needed revisit to commode for attempt at another BM. CGA > stand and stand pivot with grab bar > toilet. Continent of void only and CGA for toileting steps and transfer back to w/c.   Pt remained seated in w/c, with chair alarm on/activated, and with all needs in reach at end of session.  Vitals BP 169/75  (sitting EOB at rest) BP 132/51 (sitting; after ambulating/toileting) BP 164/56 (sitting after seated ADLs)   Session 2 Skilled OT intervention completed with focus on toileting needs, ambulatory transfers, RUE AAROM. Pt received seated in w/c, agreeable to session. R shoulder pain reported with reaching; pre-medicated. OT offered rest breaks, repositioning and gentle stretching throughout for pain reduction.  Pt requested to use bathroom. CGA sit > stand with RW, then ambulated with CGA using RW with improved RLE clearance > toilet. CGA for lowering clothing. Continent of urinary void and loose (type 7) BM; nurse aware. Seated pericare independently. Min A sit > stand with grab bar and RW then CGA for redonning clothing and ambulatory transfer back to w/c with RW. Set up A for hand hygiene seated at sink.  Transported dependently in w/c <> gym. Seated at table top, pt completed the following activities to promote functional use of BUE needed for independence with BADLs: Table slides on LUE/BUE (x30) -shoulder flexion -horizontal abduction -shoulder external rotation AAROM (with PVC pipe) x10 -BUE shoulder flexion -RUE shoulder abduction -BUE scapular protraction  Pt with slight discomfort in R shoulder, however improved with AAROM vs AROM. Discussed weakness vs impingement.  Back in room pt remained seated in w/c. NT notified of pt request to return to bed and OT time constraint. Chair alarm on/activated, and all needs in reach at end of session.   Therapy Documentation Precautions:  Precautions Precautions: Fall Precaution/Restrictions Comments: R hemiparesis Restrictions Weight Bearing Restrictions Per  Provider Order: No    Therapy/Group: Individual Therapy  Melvyn Novas, MS, OTR/L  11/06/2023, 3:38 PM

## 2023-11-06 NOTE — Plan of Care (Signed)
  Problem: Consults Goal: RH STROKE PATIENT EDUCATION Description: See Patient Education module for education specifics  11/06/2023 1935 by Tula Nakayama, LPN Outcome: Progressing 11/06/2023 1934 by Tula Nakayama, LPN Outcome: Progressing   Problem: RH BOWEL ELIMINATION Goal: RH STG MANAGE BOWEL WITH ASSISTANCE Description: STG Manage Bowel with toileting Assistance. 11/06/2023 1935 by Tula Nakayama, LPN Outcome: Progressing 11/06/2023 1934 by Tula Nakayama, LPN Outcome: Progressing Goal: RH STG MANAGE BOWEL W/MEDICATION W/ASSISTANCE Description: STG Manage Bowel with Medication with mod I Assistance. 11/06/2023 1935 by Tula Nakayama, LPN Outcome: Progressing 11/06/2023 1934 by Tula Nakayama, LPN Outcome: Progressing   Problem: RH SAFETY Goal: RH STG ADHERE TO SAFETY PRECAUTIONS W/ASSISTANCE/DEVICE Description: STG Adhere to Safety Precautions With cues Assistance/Device. 11/06/2023 1935 by Tula Nakayama, LPN Outcome: Progressing 11/06/2023 1934 by Tula Nakayama, LPN Outcome: Progressing   Problem: RH PAIN MANAGEMENT Goal: RH STG PAIN MANAGED AT OR BELOW PT'S PAIN GOAL Description: Pain < 4 with prns 11/06/2023 1935 by Tula Nakayama, LPN Outcome: Progressing 11/06/2023 1934 by Tula Nakayama, LPN Outcome: Progressing

## 2023-11-06 NOTE — Plan of Care (Signed)
  Problem: Consults Goal: RH STROKE PATIENT EDUCATION Description: See Patient Education module for education specifics  Outcome: Progressing   Problem: RH BOWEL ELIMINATION Goal: RH STG MANAGE BOWEL WITH ASSISTANCE Description: STG Manage Bowel with toileting Assistance. Outcome: Progressing Goal: RH STG MANAGE BOWEL W/MEDICATION W/ASSISTANCE Description: STG Manage Bowel with Medication with mod I Assistance. Outcome: Progressing   Problem: RH SAFETY Goal: RH STG ADHERE TO SAFETY PRECAUTIONS W/ASSISTANCE/DEVICE Description: STG Adhere to Safety Precautions With cues Assistance/Device. Outcome: Progressing   Problem: RH PAIN MANAGEMENT Goal: RH STG PAIN MANAGED AT OR BELOW PT'S PAIN GOAL Description: Pain < 4 with prns Outcome: Progressing

## 2023-11-07 DIAGNOSIS — G8111 Spastic hemiplegia affecting right dominant side: Secondary | ICD-10-CM | POA: Diagnosis not present

## 2023-11-07 DIAGNOSIS — E1165 Type 2 diabetes mellitus with hyperglycemia: Secondary | ICD-10-CM | POA: Diagnosis not present

## 2023-11-07 DIAGNOSIS — Z794 Long term (current) use of insulin: Secondary | ICD-10-CM

## 2023-11-07 DIAGNOSIS — G7000 Myasthenia gravis without (acute) exacerbation: Secondary | ICD-10-CM | POA: Diagnosis not present

## 2023-11-07 DIAGNOSIS — I6350 Cerebral infarction due to unspecified occlusion or stenosis of unspecified cerebral artery: Secondary | ICD-10-CM | POA: Diagnosis not present

## 2023-11-07 LAB — CBC WITH DIFFERENTIAL/PLATELET
Abs Immature Granulocytes: 0.03 10*3/uL (ref 0.00–0.07)
Basophils Absolute: 0 10*3/uL (ref 0.0–0.1)
Basophils Relative: 1 %
Eosinophils Absolute: 0.2 10*3/uL (ref 0.0–0.5)
Eosinophils Relative: 2 %
HCT: 27.5 % — ABNORMAL LOW (ref 36.0–46.0)
Hemoglobin: 8.7 g/dL — ABNORMAL LOW (ref 12.0–15.0)
Immature Granulocytes: 0 %
Lymphocytes Relative: 19 %
Lymphs Abs: 1.3 10*3/uL (ref 0.7–4.0)
MCH: 30.1 pg (ref 26.0–34.0)
MCHC: 31.6 g/dL (ref 30.0–36.0)
MCV: 95.2 fL (ref 80.0–100.0)
Monocytes Absolute: 0.8 10*3/uL (ref 0.1–1.0)
Monocytes Relative: 12 %
Neutro Abs: 4.5 10*3/uL (ref 1.7–7.7)
Neutrophils Relative %: 66 %
Platelets: 208 10*3/uL (ref 150–400)
RBC: 2.89 MIL/uL — ABNORMAL LOW (ref 3.87–5.11)
RDW: 12.3 % (ref 11.5–15.5)
WBC: 6.9 10*3/uL (ref 4.0–10.5)
nRBC: 0 % (ref 0.0–0.2)

## 2023-11-07 LAB — URINE CULTURE: Special Requests: NORMAL

## 2023-11-07 LAB — BASIC METABOLIC PANEL
Anion gap: 7 (ref 5–15)
BUN: 15 mg/dL (ref 8–23)
CO2: 21 mmol/L — ABNORMAL LOW (ref 22–32)
Calcium: 8.2 mg/dL — ABNORMAL LOW (ref 8.9–10.3)
Chloride: 112 mmol/L — ABNORMAL HIGH (ref 98–111)
Creatinine, Ser: 0.91 mg/dL (ref 0.44–1.00)
GFR, Estimated: >60 mL/min (ref >60)
Glucose, Bld: 168 mg/dL — ABNORMAL HIGH (ref 70–99)
Potassium: 4 mmol/L (ref 3.5–5.1)
Sodium: 140 mmol/L (ref 135–145)

## 2023-11-07 LAB — GLUCOSE, CAPILLARY
Glucose-Capillary: 263 mg/dL — ABNORMAL HIGH (ref 70–99)
Glucose-Capillary: 243 mg/dL — ABNORMAL HIGH (ref 70–99)

## 2023-11-07 MED ORDER — HYDRALAZINE HCL 25 MG PO TABS
25.0000 mg | ORAL_TABLET | Freq: Four times a day (QID) | ORAL | Status: DC | PRN
  Filled 2023-11-07: qty 1

## 2023-11-07 MED ORDER — IRBESARTAN 150 MG PO TABS
150.0000 mg | ORAL_TABLET | Freq: Every day | ORAL | Status: DC
  Administered 2023-11-07: 150 mg via ORAL
  Filled 2023-11-07 (×2): qty 1

## 2023-11-07 NOTE — Progress Notes (Signed)
 CBG 292 via patient's monitor. Pt self admin 6.9 units of insulin. CHG bath completed for the evening.

## 2023-11-07 NOTE — Progress Notes (Signed)
 Floor Pharmacist called, On call Provider contacted, and Hospitalitis paged. Dr. Ophelia Charter returned phone call. Verbal order to encourage patient to administer bolus amount per her pump to correct current CBG levels. Hold on order of stat BHB at this time.

## 2023-11-07 NOTE — Progress Notes (Signed)
 CBG is 281 patient administered herself corrective dose of 4.6 units per her insulin pump. Did express concern of her CBG levels become low. Did encourage within an hour we will recheck her CBG levels and address any issues at that time.  Patient also did express wanting to ask the Provider about new medication irbesartan and how medication compares to previous medication on olmesartan. Questions regarding the dose and is the strength equivalent to her olmesartan dose on previously.

## 2023-11-07 NOTE — Progress Notes (Addendum)
 Continues to have right shoulder pain and a headache. Endorsing right leg pain from knee to hip with flexion of leg & hip. +3 RLE, +4 LLE, +3 RUE, +5 LUE w/ataxia. A&O x4. Pupils E&R to light. Rotary nystagmus w/left eye. Explains shoulder pain and headache are more bothersome than normal this morning. Also, endorsing intermittent moments of dyspnea w/rest today. Expressing stomach discomfort this morning. Decrease appetite, did eat approxiametley 80% of her breakfast this morning. Continues to have type 6 and type 7 stools since 3/13.

## 2023-11-07 NOTE — Progress Notes (Signed)
 Explaining to patient about basal units. Talked about peak of BU's to offer reassurance. Is asking for snack/sandwich to be sent up at night.

## 2023-11-07 NOTE — Consult Note (Signed)
 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 Ultrasound of the carotids noted right ICA gnosis at 40 to 59% and patent stent of the left internal carotid Placed on aspirin and Plavix for 3 weeks then Plavix alone Transferred to CIR for further rehab   SIRS  Patient was noted to be tachycardic overnight with WBC elevated up to 12.6 Procalcitonin was noted to be less than 0.1 Chest x-ray showed no acute abnormality Urinalysis did not show significant signs for infection Possibly reactive due to nausea and vomiting   Dizziness Residual symptom this AM, improving and not restricting her ability to participate in therapy Possible orthostasis, will check Renal function is at baseline She did have 5 ketones on UA yesterday so suspect mild hypovolemia that is likely to continue to improve with ongoing improved PO intake   Nausea and vomiting Resolved with treatment of constipation   Prolonged QT interval EKG revealed a normal sinus rhythm at 94 bpm with right bundle branch block and QTc 502.   Recheck EKG with resolution  Uncontrolled diabetes mellitus type 2, with long-term use of insulin Patient on insulin pump and Jardiance at home Hemoglobin A1c was noted to be 9.2, poor control Continue insulin pump and Jardiance Diabetic education consulted to assist   Hypertension Resume olmesartan 20 mg daily (irbesartan 150 mg daily per formulary) Will add prn PO hydralazine prn SBP >160 in the setting of recent CVA   Ocular myasthenia gravis Patient followed by [facility] in the outpatient setting Receives Rituxan and IVIG every 6 weeks Continue  prednisone and CellCept    Hypothyroidism H/p thyroidectomy due to thyroid CA TSH noted to be 0.02 which appears to be improved from most recent check on 3/10 of 0.012 Records note Synthroid dose had been decreased from 200 mcg down to 100 mcg during her most recent hospitalization Continue current dose of levothyroxine Recommend rechecking TSH in 4 weeks given recent dose adjustment/following up with endocrinology   Normocytic anemia Hgb 8.7 She has had fairly significant variability so this may be baseline Recheck in AM   Hyperlipidemia Continue Crestor, Zetia, and Repatha   History of breast cancer Continue tamoxifen     TR[facility] will continue to follow the patient.  HPI: [name] is a 73 y.o. female with past medical history of HTN, HLD, CVA, DM on insulin pump, myasthenia gravis, OSA, and thyroid/breast cancers s/p treatment who presented on 3/7 with acute CVA.  She was discharged to CIR on 3/11.  She participated in therapy this AM and is now fatigued.  Her glucose was "low" this AM (71) and so she is adjusting her pump accordingly.  Orthostatics not yet done.  Review of Systems: As mentioned in the history of present illness. All other systems reviewed and are negative. Past Medical History:  Diagnosis Date   Arthritis    "all over my body" (03/13/2013)   Asthma    Asymptomatic carotid artery stenosis, bilateral 10/08/2018   Breast cancer (HCC)    GERD (gastroesophageal reflux disease)    H/O hiatal hernia    Headache    pt states she has had headaches for about 6 months "off and on"   Heart  murmur    pt had echocardiogram on 09/17/21   Hypercholesterolemia 10/08/2018   Hypertension    Hypothyroidism    Myasthenia gravis (HCC)    "in my eyes; diagnsosed > 7 yr ago" (03/13/2013)   Sleep apnea    on CPAP   Stroke (HCC) 2021   Thyroid carcinoma (HCC)    Type II diabetes mellitus (HCC)    Past Surgical History:  Procedure Laterality Date   ABDOMINAL  HYSTERECTOMY     ANTERIOR CERVICAL DECOMP/DISCECTOMY FUSION     "I've had severa ORs; always went in from the front" (03/13/2013)   APPENDECTOMY     BREAST LUMPECTOMY WITH RADIOACTIVE SEED AND SENTINEL LYMPH NODE BIOPSY Left 09/02/2022   Procedure: LEFT BREAST LUMPECTOMY WITH RADIOACTIVE SEED AND SENTINEL LYMPH NODE BIOPSY;  Surgeon: Abigail Miyamoto, MD;  Location: [facility] OR;  Service: General;  Laterality: Left;   CARDIAC CATHETERIZATION     "several" (03/13/2013)   CARPAL TUNNEL RELEASE Right    CATARACT EXTRACTION W/ INTRAOCULAR LENS  IMPLANT, BILATERAL Bilateral    CHOLECYSTECTOMY     KNEE ARTHROSCOPY Left    SHOULDER ARTHROSCOPY W/ ROTATOR CUFF REPAIR Left    TONSILLECTOMY     TOTAL THYROIDECTOMY     TRANSCAROTID ARTERY REVASCULARIZATION  Left 09/24/2021   Procedure: LEFT TRANSCAROTID ARTERY REVASCULARIZATION;  Surgeon: Leonie Douglas, MD;  Location: [facility] OR;  Service: Vascular;  Laterality: Left;   TRANSCAROTID ARTERY REVASCULARIZATION  Right 10/23/2021   Procedure: Right Transcarotid Artery Revascularization;  Surgeon: Leonie Douglas, MD;  Location: [facility] OR;  Service: Vascular;  Laterality: Right;   ULTRASOUND GUIDANCE FOR VASCULAR ACCESS Right 09/24/2021   Procedure: ULTRASOUND GUIDANCE FOR VASCULAR ACCESS;  Surgeon: Leonie Douglas, MD;  Location: [facility] OR;  Service: Vascular;  Laterality: Right;   ULTRASOUND GUIDANCE FOR VASCULAR ACCESS Right 10/23/2021   Procedure: ULTRASOUND GUIDANCE FOR VASCULAR ACCESS, LEFT FEMORAL VEIN;  Surgeon: Leonie Douglas, MD;  Location: [facility] OR;  Service: Vascular;  Laterality: Right;   Social History:  reports that she has never smoked. She has never used smokeless tobacco. She reports that she does not drink alcohol and does not use drugs.  Allergies  Allergen Reactions   Contrast Media [Iodinated Contrast Media] Anaphylaxis    01/10/15 and 09/18/2021 --PT GIVEN 13 HR PRE MEDS FOR CT with contrast TOLERATED IV CONTRAST W/O ANY REACTION   Fluorescein  Shortness Of Breath and Other (See Comments)    (Dye)   Iodine Anaphylaxis    Contrast dye - iodine   Magnesium-Containing Compounds Other (See Comments)    H/O myasthenia gravis- caution replacing IV    Molds & Smuts Shortness Of Breath and Other (See Comments)    Congestion and wheezing, also   Shellfish-Derived Products Anaphylaxis, Shortness Of Breath, Swelling and Other (See Comments)    Welts, also   Pholcodine Hives   Azithromycin Nausea Only   Codeine Hives and Nausea Only   Metformin Hcl Er Nausea Only   Oxycodone Nausea Only    Nausea 08/2022 admission.   Tramadol Hcl Nausea Only   Other Rash and Other (See Comments)    Coban causes welts, also    Family History  Problem Relation Age of Onset   Coronary artery disease Sister        s/p coronary stenting   Stroke Sister 60   Diabetes Sister    Congestive Heart Failure Mother    Asthma Mother    Diabetes Mother    Congestive  Heart Failure Father    Lung cancer Father    Asthma Father    Diabetes Father    Diabetes Brother     Prior to Admission medications   Medication Sig Start Date End Date Taking? Authorizing Provider  acetaminophen (TYLENOL) 500 MG tablet Take 500 mg by mouth every 6 (six) hours as needed for moderate pain.    [provider]  aspirin EC 81 MG tablet Take 1 tablet (81 mg total) by mouth daily for 18 days. 11/02/23 11/20/23  Elgergawy, Leana Roe, MD  clopidogrel (PLAVIX) 75 MG tablet Take 1 tablet (75 mg total) by mouth daily. 11/03/23   Elgergawy, Leana Roe, MD  diphenoxylate-atropine (LOMOTIL) 2.5-0.025 MG tablet Take 1-2 tablets by mouth 4 (four) times daily as needed for diarrhea or loose stools. 10/28/22   Pollyann Samples, NP  ELDERBERRY PO Take 1 tablet by mouth daily.    [provider]  empagliflozin (JARDIANCE) 25 MG TABS tablet Take 25 mg by mouth daily.    [provider]  esomeprazole (NEXIUM) 40 MG capsule Take 1 capsule (40 mg total) by mouth 2 (two) times  daily. 01/08/20   Angiulli, Mcarthur Rossetti, PA-C  Evolocumab with Infusor (REPATHA PUSHTRONEX SYSTEM) 420 MG/3.5ML SOCT Inject 420 mg into the skin every 30 (thirty) days.    [provider]  Exenatide ER (BYDUREON BCISE) 2 MG/0.85ML AUIJ Inject 2 mg into the skin every Monday.    [provider]  ezetimibe (ZETIA) 10 MG tablet TAKE 1 TABLET BY MOUTH EVERY DAY 01/16/21   Zakai Gonyea Decamp, MD  Immune Globulin, Human, 40 GM/400ML SOLN Inject into the vein every 6 (six) weeks. Infusions are administered at home by home health nurse - last infusion 09/08/21 and 09/09/21; next infusion due 10/19/21 and 10/20/21 02/21/20   [provider]  insulin lispro (HUMALOG) 100 UNIT/ML injection Inject into the skin See admin instructions. Inject subcutaneously 3 (three) times daily with meals With meals through the Omnipod Dash Pump every 72 hours as directed    [provider]  levothyroxine (SYNTHROID) 100 MCG tablet Take 1 tablet (100 mcg total) by mouth daily at 6 (six) AM. 11/03/23   Elgergawy, Leana Roe, MD  lidocaine-prilocaine (EMLA) cream Apply to affected area once 10/15/22   Malachy Mood, MD  loperamide (IMODIUM A-D) 2 MG tablet Take 2 mg by mouth 4 (four) times daily as needed for diarrhea or loose stools.    [provider]  loratadine (CLARITIN) 10 MG tablet Take 1 tablet (10 mg total) by mouth at bedtime. 01/08/20   Angiulli, Mcarthur Rossetti, PA-C  meclizine (ANTIVERT) 25 MG tablet Take 25 mg by mouth 3 (three) times daily as needed for dizziness or nausea (or migraine-related nausea). 06/27/13   [provider]  montelukast (SINGULAIR) 10 MG tablet Take 1 tablet (10 mg total) by mouth every morning. 01/08/20   Angiulli, Mcarthur Rossetti, PA-C  mycophenolate (CELLCEPT) 500 MG tablet Take 1,000 mg by mouth 2 (two) times daily. For myasthenia gravis 09/04/21   [provider]  olmesartan (BENICAR) 20 MG tablet Take 1 tablet (20 mg total) by mouth daily. 01/08/20   Angiulli, Mcarthur Rossetti,  PA-C  ondansetron (ZOFRAN) 8 MG tablet TAKE 1 TABLET BY MOUTH EVERY 8  HOURS AS NEEDED FOR NAUSEA AND  VOMITING 08/31/23   Carlean Jews, NP  predniSONE (DELTASONE) 2.5 MG tablet Take 7.5 mg by mouth every morning. For myasthenia gravis    [provider]  pregabalin (  LYRICA) 25 MG capsule Take 1 capsule (25 mg total) by mouth 2 (two) times daily. 07/28/23   Pollyann Samples, NP  PRESCRIPTION MEDICATION Pt uses a cpap nightly    [provider]  PROAIR HFA 108 (90 BASE) MCG/ACT inhaler Inhale 2 puffs into the lungs every 6 (six) hours as needed for wheezing or shortness of breath.  06/02/13   [provider]  prochlorperazine (COMPAZINE) 10 MG tablet Take 1 tablet (10 mg total) by mouth every 6 (six) hours as needed for nausea or vomiting. 10/07/22   Malachy Mood, MD  riTUXimab (RITUXAN) 500 MG/50ML injection Inject into the vein every 6 (six) months.    [provider]  rosuvastatin (CRESTOR) 40 MG tablet Take 1 tablet (40 mg total) by mouth daily. 02/13/21 04/22/24  Tanya Marvin Decamp, MD  tamoxifen (NOLVADEX) 20 MG tablet Take 1 tablet (20 mg total) by mouth daily. 06/07/23   Malachy Mood, MD  topiramate (TOPAMAX) 50 MG tablet TAKE 1 TABLET BY MOUTH AT  BEDTIME 10/04/23   Ihor Austin, NP  vitamin B-12 (CYANOCOBALAMIN) 1000 MCG tablet Take 1,000 mcg by mouth once a week.    [provider]  Vitamin D, Ergocalciferol, (DRISDOL) 50000 UNITS CAPS capsule Take 50,000 Units by mouth every Monday.  12/12/13   [provider]    Physical Exam: Vitals:   11/06/23 1748 11/06/23 1800 11/06/23 1931 11/07/23 0621  BP: (!) 165/95 (!) (P) 152/75 (!) 147/73 (!) 178/88  Pulse: (!) 105  96 83  Resp:   18 18  Temp:   98.1 F (36.7 C) 98 F (36.7 C)  TempSrc:   Oral Oral  SpO2:   100% 100%  Weight:      Height:         Intake/Output Summary (Last 24 hours) at 11/07/2023 0749 Last data filed at 11/06/2023 1850 Gross per 24 hour  Intake 1200 ml  Output --  Net  1200 ml   Filed Weights   11/03/23 1405  Weight: 86.2 kg    Exam:  General:  Appears calm and comfortable and is in NAD Eyes:  EOMI, normal lids, iris ENT:  grossly normal hearing, lips & tongue, mmm Cardiovascular:  RRR, no m/r/g. No LE edema.  Respiratory:   CTA bilaterally with no wheezes/rales/rhonchi.  Normal respiratory effort. Abdomen:  soft, NT, ND Skin:  no rash or induration seen on limited exam Musculoskeletal:  grossly normal tone BUE/BLE, good ROM, no bony abnormality Psychiatric:  grossly normal mood and affect, speech fluent and appropriate, AOx3 Neurologic:  CN 2-12 grossly intact, moves all extremities in coordinated fashion  Data Reviewed: I have reviewed the patient's lab results since admission.  Pertinent labs for today include:   Glucose 168 WBC 6.9 Hgb 8.7    Family Communication: None present  Thank you very much for involving [place] in the care of your patient.  Author: Jonah Blue, MD 11/07/2023 7:47 AM  For on call review [email or web address].

## 2023-11-07 NOTE — Progress Notes (Signed)
 Waking up for lunch. Endorsed a headache to the right side of her head. Expressed headache still the same as earlier with pain level. Explains the feeling of the pain as a "throbbing sensation." Also, endorsing decrease in energy today and expressed difficulty staying awake. Denies any sensation with her heart racing at this time.

## 2023-11-07 NOTE — Plan of Care (Signed)
  Problem: Consults Goal: RH STROKE PATIENT EDUCATION Description: See Patient Education module for education specifics  Outcome: Progressing   Problem: RH BOWEL ELIMINATION Goal: RH STG MANAGE BOWEL WITH ASSISTANCE Description: STG Manage Bowel with toileting Assistance. Outcome: Progressing   Problem: RH SAFETY Goal: RH STG ADHERE TO SAFETY PRECAUTIONS W/ASSISTANCE/DEVICE Description: STG Adhere to Safety Precautions With cues Assistance/Device. Outcome: Progressing   Problem: RH PAIN MANAGEMENT Goal: RH STG PAIN MANAGED AT OR BELOW PT'S PAIN GOAL Description: Pain < 4 with prns Outcome: Progressing   Problem: RH KNOWLEDGE DEFICIT Goal: RH STG INCREASE KNOWLEDGE OF DIABETES Description: Patient and sister will be able to manage DM using educational resources for medications and dietary modification independently Outcome: Progressing

## 2023-11-07 NOTE — Progress Notes (Addendum)
 Patient explained that her Omni Pump/Insulin Monitor reported CBG of 255 at 1355. Explains at 1326 gave herself 7.25 units of insulin. 1446 CBG is 263. Patient current reading is 280 with insulin pump/monitor.

## 2023-11-07 NOTE — Progress Notes (Signed)
 PROGRESS NOTE   Subjective/Complaints:  Appreciate IM note Pt with multiple type 6 stools yesterday , no abd pain   ROS: +R side weakness and numbness , -nausea, -emesis, no joint pain   Objective:   VAS [place] LOWER EXTREMITY VENOUS (DVT) Result Date: 11/06/2023  Lower Venous DVT Study Patient Name:  [name]  Date of Exam:   11/05/2023 Medical Rec #: [id number]        Accession #:    [id number] Date of Birth: 1951-02-14         Patient Gender: F Patient Age:   73 years Exam Location:  [facility] Procedure:      VAS [place] LOWER EXTREMITY VENOUS (DVT) Referring Phys: PAMELA LOVE --------------------------------------------------------------------------------  Indications: Stroke, and Elevated D-dimer, right face, arm and leg numbness. Other Indications: Thyroid cancer, left TIA, left infarct. Limitations: Calcification in adjacent arteries. Comparison Study: Previous study on 4.28.2021. Performing Technologist: Fernande Bras  Examination Guidelines: A complete evaluation includes B-mode imaging, spectral Doppler, color Doppler, and power Doppler as needed of all accessible portions of each vessel. Bilateral testing is considered an integral part of a complete examination. Limited examinations for reoccurring indications may be performed as noted. The reflux portion of the exam is performed with the patient in reverse Trendelenburg.  +---------+---------------+---------+-----------+----------+--------------+ RIGHT    CompressibilityPhasicitySpontaneityPropertiesThrombus Aging +---------+---------------+---------+-----------+----------+--------------+ CFV      Full           Yes      Yes                                 +---------+---------------+---------+-----------+----------+--------------+ SFJ      Full           Yes      Yes                                  +---------+---------------+---------+-----------+----------+--------------+ FV Prox  Full                                                        +---------+---------------+---------+-----------+----------+--------------+ FV Mid   Full                                                        +---------+---------------+---------+-----------+----------+--------------+ FV DistalFull                                                        +---------+---------------+---------+-----------+----------+--------------+ PFV      Full                                                        +---------+---------------+---------+-----------+----------+--------------+  POP      Full           Yes      Yes                                 +---------+---------------+---------+-----------+----------+--------------+ PTV      Full                                                        +---------+---------------+---------+-----------+----------+--------------+ PERO     Full                                                        +---------+---------------+---------+-----------+----------+--------------+   +---------+---------------+---------+-----------+----------+--------------+ LEFT     CompressibilityPhasicitySpontaneityPropertiesThrombus Aging +---------+---------------+---------+-----------+----------+--------------+ CFV      Full           Yes      Yes                                 +---------+---------------+---------+-----------+----------+--------------+ SFJ      Full           Yes      Yes                                 +---------+---------------+---------+-----------+----------+--------------+ FV Prox  Full                                                        +---------+---------------+---------+-----------+----------+--------------+ FV Mid   Full                                                         +---------+---------------+---------+-----------+----------+--------------+ FV DistalFull                                                        +---------+---------------+---------+-----------+----------+--------------+ PFV      Full                                                        +---------+---------------+---------+-----------+----------+--------------+ POP      Full           Yes      Yes                                 +---------+---------------+---------+-----------+----------+--------------+  PTV      Full                                                        +---------+---------------+---------+-----------+----------+--------------+ PERO     Full                                                        +---------+---------------+---------+-----------+----------+--------------+     Summary: BILATERAL: - No evidence of deep vein thrombosis seen in the lower extremities, bilaterally. -No evidence of popliteal cyst, bilaterally.   *See table(s) above for measurements and observations. Electronically signed by Heath Lark on 11/06/2023 at 2:16:43 PM.    Final    NM Pulmonary Perfusion Result Date: 11/05/2023 CLINICAL DATA:  Chest pain, nonspecific elevated D Dimer EXAM: NUCLEAR MEDICINE PERFUSION LUNG SCAN TECHNIQUE: Perfusion images were obtained in multiple projections after intravenous injection of radiopharmaceutical. Ventilation scans intentionally deferred if perfusion scan and chest x-ray adequate for interpretation during COVID 19 epidemic. RADIOPHARMACEUTICALS:  4.0 mCi Tc-64m MAA IV COMPARISON:  Chest x-ray 11/05/2023 FINDINGS: Homogeneous distribution of radiotracer throughout both lungs. No perfusion defects. IMPRESSION: Normal exam.  No scintigraphic evidence for pulmonary embolism. Electronically Signed   By: Duanne Guess D.O.   On: 11/05/2023 15:23   CT HEAD WO CONTRAST ( ) Result Date: 11/05/2023 CLINICAL DATA:  73 year old female neurologic  deficit. Code stroke presentation on 10/29/2023 with small dorsal pontine lacunar infarct at that time. EXAM: CT HEAD WITHOUT CONTRAST TECHNIQUE: Contiguous axial images were obtained from the base of the skull through the vertex without intravenous contrast. RADIATION DOSE REDUCTION: This exam was performed according to the departmental dose-optimization program which includes automated exposure control, adjustment of the mA and/or kV according to patient size and/or use of iterative reconstruction technique. COMPARISON:  Brain MRI and head CT 10/29/2023 FINDINGS: Brain: Cerebral volume remains normal for age. No midline shift, mass effect, or evidence of intracranial mass lesion. No ventriculomegaly. No acute intracranial hemorrhage identified. Subtle hypodensity now in the dorsal right pons by CT on series 3, image 11. Otherwise Stable gray-white matter differentiation throughout the brain. No cortically based acute infarct identified. Chronic heterogeneity in the thalami. Vascular: No suspicious intracranial vascular hyperdensity. Calcified atherosclerosis at the skull base. Skull: No acute osseous abnormality identified. Sinuses/Orbits: Chronic left sphenoid sinus disease, right maxillary sinus retention cysts. Tympanic cavities and mastoids remain well aerated. Other: No acute orbit or scalp soft tissue finding. IMPRESSION: No acute intracranial abnormality. Expected CT appearance of the recent dorsal right pontine infarct and other chronic small vessel disease. Electronically Signed   By: Odessa Fleming M.D.   On: 11/05/2023 13:20   DG Chest 2 View Result Date: 11/05/2023 CLINICAL DATA:  Follow-up.  Cough and nausea and vomiting. EXAM: CHEST - 2 VIEW COMPARISON:  09/16/2021 FINDINGS: There is a right chest wall port a catheter with tip at the superior cavoatrial junction. Heart size and mediastinal contours appear normal. No pleural fluid, interstitial edema or airspace disease. Visualized osseous structures are  unremarkable. IMPRESSION: No acute cardiopulmonary disease. Electronically Signed   By: Signa Kell M.D.   On: 11/05/2023 07:26   Recent Labs  11/06/23 0858 11/07/23 0233  WBC 8.0 6.9  HGB 10.2* 8.7*  HCT 31.7* 27.5*  PLT 240 208   Recent Labs    11/06/23 0858 11/07/23 0233  NA 138 140  K 3.5 4.0  CL 110 112*  CO2 19* 21*  GLUCOSE 133* 168*  BUN 16 15  CREATININE 1.05* 0.91  CALCIUM 8.2* 8.2*    Intake/Output Summary (Last 24 hours) at 11/07/2023 0631 Last data filed at 11/06/2023 1850 Gross per 24 hour  Intake 1440 ml  Output --  Net 1440 ml         Physical Exam: Vital Signs Blood pressure (!) 178/88, pulse 83, temperature 98 F (36.7 C), temperature source Oral, resp. rate 18, height 5\' 3"  (1.6 m), weight 86.2 kg, SpO2 100%.  General: No acute distress Mood and affect are appropriate Heart: Regular rate and rhythm no rubs murmurs or extra sounds Lungs: Clear to auscultation, breathing unlabored, no rales or wheezes Abdomen: Positive bowel sounds, soft nontender to palpation, nondistended Extremities: No clubbing, cyanosis, or edema Skin: No evidence of breakdown, no evidence of rash      Musculoskeletal:        General: Normal range of motion.     Cervical back: Neck supple. No tenderness.     Comments:  RUE- 3-/5  LUE- 5/5  RLE- 3-/5  LLE-5/5  Neurological:     Mental Status: She is alert and oriented to person, place, and time.  Reduced sensation LT on right side including face  Bilateral mild ptosis  Psychiatric:        Mood and Affect: Mood normal.        Behavior: Behavior normal.   Assessment/Plan: 1. Functional deficits which require 3+ hours per day of interdisciplinary therapy in a comprehensive inpatient rehab setting. Physiatrist is providing close team supervision and 24 hour management of active medical problems listed below. Physiatrist and rehab team continue to assess barriers to discharge/monitor patient progress toward  functional and medical goals  Care Tool:  Bathing    Body parts bathed by patient: Right arm, Left arm, Chest, Abdomen, Right upper leg, Left upper leg, Face, Right lower leg, Left lower leg   Body parts bathed by helper: Front perineal area, Buttocks     Bathing assist Assist Level: Moderate Assistance - Patient 50 - 74%     Upper Body Dressing/Undressing Upper body dressing   What is the patient wearing?: Pull over shirt, Bra    Upper body assist Assist Level: Moderate Assistance - Patient 50 - 74%    Lower Body Dressing/Undressing Lower body dressing      What is the patient wearing?: Pants     Lower body assist Assist for lower body dressing: Minimal Assistance - Patient > 75%     Toileting Toileting    Toileting assist Assist for toileting: Contact Guard/Touching assist     Transfers Chair/bed transfer  Transfers assist  Chair/bed transfer activity did not occur: Safety/medical concerns (nausea, lethargy)  Chair/bed transfer assist level: Minimal Assistance - Patient > 75%     Locomotion Ambulation   Ambulation assist   Ambulation activity did not occur: Safety/medical concerns (nausea, lethargy)  Assist level: Minimal Assistance - Patient > 75% Assistive device: Walker-rolling Max distance: 16ft   Walk 10 feet activity   Assist  Walk 10 feet activity did not occur: Safety/medical concerns (nausea, lethargy)  Assist level: Minimal Assistance - Patient > 75% Assistive device: Walker-rolling   Walk 50 feet activity  Assist Walk 50 feet with 2 turns activity did not occur: Safety/medical concerns (nausea, lethargy)  Assist level: Minimal Assistance - Patient > 75% Assistive device: Walker-rolling    Walk 150 feet activity   Assist Walk 150 feet activity did not occur: Safety/medical concerns (nausea, lethargy)         Walk 10 feet on uneven surface  activity   Assist Walk 10 feet on uneven surfaces activity did not occur:  Safety/medical concerns (nausea, lethargy)         Wheelchair     Assist Is the patient using a wheelchair?: Yes Type of Wheelchair: Manual Wheelchair activity did not occur: Safety/medical concerns (nausea, lethargy)         Wheelchair 50 feet with 2 turns activity    Assist    Wheelchair 50 feet with 2 turns activity did not occur: Safety/medical concerns (nausea, lethargy)       Wheelchair 150 feet activity     Assist  Wheelchair 150 feet activity did not occur: Safety/medical concerns (nausea, lethargy)       Blood pressure (!) 178/88, pulse 83, temperature 98 F (36.7 C), temperature source Oral, resp. rate 18, height 5\' 3"  (1.6 m), weight 86.2 kg, SpO2 100%. Medical Problem List and Plan: 1. 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  Antithrombotics: -DVT/anticoagulation:  Pharmaceutical: Lovenox             -antiplatelet therapy: Aspirin and Plavix for three weeks followed by Plavix alone (has ICA stent)   3. Pain Management: Tylenol as needed             -Lyrica 75 mg BID   4. Mood/Behavior/Sleep: LCSW to evaluate and provide emotional support             -antipsychotic agents: n/a   5. Neuropsych/cognition: This patient is capable of making decisions on her own behalf.   6. Skin/Wound Care: Routine skin care checks   7. 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 Nausea: thought to be 2/2 constipation but has not resolved with constipation management- patient now having BM, improved later in the day  20. Right upper extremity pain: VAS [place] ordered to assess for clot, discussed that this was negative  21. Emesis: continue IV zofran, medicine consulted for their opinion, thyroid labs were ordered last night and are abnormal, improved later in the day  22. Chest pain/tachycardia: resolved neg V/Q scan, EKG neg  23. Hypokalemia: kdur 40 BID ordered x2 doses on 3/14- repleted   24.  Mild DOE, no cough or fever, resp panel Neg, airborne prec d/ced  25.  Constipation improved not on laxatives, had freq soft stools yesterday , abd soft, reduced BS this am , monitor , would avoid anti diarrheals at this point   LOS: 5 days A FACE TO FACE EVALUATION WAS PERFORMED  Erick Colace 11/07/2023, 6:31 AM

## 2023-11-07 NOTE — Progress Notes (Addendum)
 Provider informed and made aware of symptoms reported by patient and signs observed. Prior to therapy continued to endorse headache and right shoulder pain. Spasticity with right leg. Ataxia observed with left arm earlier this morning not observed at this time. Fine tremors observed transferring patient from wheelchair to bed around 1000 this morning. Nystagmus still observed with left eye. Pupils remain equal and reactive. Lips and face resting to the left more once sitting up during therapy. Improved after a few minutes and lips/face symmetrical. Patient after therapy endorses sensation of "heart racing." Continues to have loose and watery stools. Increase in gas/burping more this morning. Patient administering 4.2 unit with her OmniPump for CBG 198.  .. Vitals:   11/07/23 0922 11/07/23 1014  BP: (!) 189/87 (!) 185/84  Pulse: 93 100  Resp:    Temp:    SpO2:

## 2023-11-07 NOTE — Progress Notes (Signed)
 Occupational Therapy Session Note  Patient Details  Name: [name] MRN: [id number] Date of Birth: 1951-03-15  Today's Date: 11/07/2023 OT Individual Time: 9147-8295 OT Individual Time Calculation (min): 48 min   Short Term Goals: Week 1:  OT Short Term Goal 1 (Week 1): Pt will complete sit > stand with CGA OT Short Term Goal 2 (Week 1): Pt will complete toilet transfer with CGA using LRAD OT Short Term Goal 3 (Week 1): Pt will donn LB clothing supervision using AE PRN  Skilled Therapeutic Interventions/Progress Updates:  Pt greeted resting in bed for skilled OT session with focus on BADL retraining and functional transfers. No current hold for therapy.  Pain: Pt reported 6/10 headache pain, medications administered during session. OT offering intermediate rest breaks and positioning suggestions throughout session to address pain/fatigue and maximize participation/safety in session.   Functional Transfers: Supine>EOB with Min A for management of RLE across bed and trunk elevation. Stand-pivots at CGA overall + RW/support on grab bar for WC>toilet. Sit<>stands in similar fashion, cuing required for reach towards arm-rest to slow descent on WC.   Self Care Tasks: Pt completes the following self care tasks with levels of assistance noted below, UB: Educated on overhead t-shirt doffing technique. Bathing with A provided for back, re-inforced hemi-dressing techniques for donning top, overall supervision. LB: Pt requires A for unthreading/threading of RLE, standing hike with CGA + intermediate support on sink-side edge, pericare bathing in similar fashion. Seated bathing of BLE with supervision + RUE support on sink-edge for dynamic sitting balance.   Vitals monitored throughout, recorded below:  Supine BP=168/59, HR=92, O2=100  Sitting EOB BP=189/87, HR=93  Post standing activity BP=145/58, HR=99, O2=100  LPN updated.   Pt educated on need to communicate presence/increase in pain  from headache as it can be a warning sign of elevated BP. Pt receptive.   Pt remained toileting (requesting increased time to void), NT/LPN made aware, and immediate needs met. Pt continues to be appropriate for skilled OT intervention to promote further functional independence in ADLs/IADLs.   Therapy Documentation Precautions:  Precautions Precautions: Fall Precaution/Restrictions Comments: R hemiparesis Restrictions Weight Bearing Restrictions Per Provider Order: No   Therapy/Group: Individual Therapy  Lou Cal, OTR/L, MSOT  11/07/2023, 6:14 AM

## 2023-11-08 ENCOUNTER — Inpatient Hospital Stay (HOSPITAL_COMMUNITY)

## 2023-11-08 DIAGNOSIS — I6350 Cerebral infarction due to unspecified occlusion or stenosis of unspecified cerebral artery: Secondary | ICD-10-CM | POA: Diagnosis not present

## 2023-11-08 LAB — BASIC METABOLIC PANEL
Anion gap: 5 (ref 5–15)
BUN: 14 mg/dL (ref 8–23)
CO2: 20 mmol/L — ABNORMAL LOW (ref 22–32)
Calcium: 8 mg/dL — ABNORMAL LOW (ref 8.9–10.3)
Chloride: 112 mmol/L — ABNORMAL HIGH (ref 98–111)
Creatinine, Ser: 0.94 mg/dL (ref 0.44–1.00)
GFR, Estimated: >60 mL/min (ref >60)
Glucose, Bld: 217 mg/dL — ABNORMAL HIGH (ref 70–99)
Potassium: 4.4 mmol/L (ref 3.5–5.1)
Sodium: 137 mmol/L (ref 135–145)

## 2023-11-08 LAB — CBC
HCT: 26.9 % — ABNORMAL LOW (ref 36.0–46.0)
Hemoglobin: 8.6 g/dL — ABNORMAL LOW (ref 12.0–15.0)
MCH: 30.6 pg (ref 26.0–34.0)
MCHC: 32 g/dL (ref 30.0–36.0)
MCV: 95.7 fL (ref 80.0–100.0)
Platelets: 205 10*3/uL (ref 150–400)
RBC: 2.81 MIL/uL — ABNORMAL LOW (ref 3.87–5.11)
RDW: 12.4 % (ref 11.5–15.5)
WBC: 5.4 10*3/uL (ref 4.0–10.5)
nRBC: 0 % (ref 0.0–0.2)

## 2023-11-08 LAB — GLUCOSE, CAPILLARY
Glucose-Capillary: 258 mg/dL — ABNORMAL HIGH (ref 70–99)
Glucose-Capillary: 314 mg/dL — ABNORMAL HIGH (ref 70–99)

## 2023-11-08 LAB — OCCULT BLOOD X 1 CARD TO LAB, STOOL: Fecal Occult Bld: NEGATIVE

## 2023-11-08 MED ORDER — CHLORHEXIDINE GLUCONATE CLOTH 2 % EX PADS
6.0000 | MEDICATED_PAD | Freq: Two times a day (BID) | CUTANEOUS | Status: DC
  Administered 2023-11-08 – 2023-11-18 (×18): 6 via TOPICAL

## 2023-11-08 MED ORDER — PSYLLIUM 95 % PO PACK
1.0000 | PACK | Freq: Every day | ORAL | Status: DC
  Administered 2023-11-09 – 2023-11-18 (×10): 1 via ORAL
  Filled 2023-11-08 (×10): qty 1

## 2023-11-08 MED ORDER — IRBESARTAN 75 MG PO TABS
75.0000 mg | ORAL_TABLET | Freq: Every day | ORAL | Status: DC
  Administered 2023-11-09: 75 mg via ORAL
  Filled 2023-11-08: qty 1

## 2023-11-08 NOTE — Progress Notes (Signed)
 Met with the patient to review current medical situation, rehab process, team conference, and plan of care. Reviewed management of secondary risk factors including DM (A1C 9.2), HTN, HLD on Zetia, Crestor and Repatha PTA. Patient having trouble with HTN and constipation; continent of bladder.  New onset of vertigo and left facial droop; CT stat order placed, en-route to CT. Discussed medications and dietary modification for HH/CMM diet and insulin pump management. Continue to follow along to address educational needs to facilitate preparation for discharge. [name]

## 2023-11-08 NOTE — Progress Notes (Signed)
 PROGRESS NOTE   Subjective/Complaints: Bp soft this morning and she was dizzy with therapy- decreased avapro to 75mg  daily She asks for HS snack- ordered Discussed decreased Hgb  ROS: +R side weakness and numbness , -nausea, -emesis, no joint pain , +dizziness  Objective:   No results found.  Recent Labs    11/07/23 0233 11/08/23 0425  WBC 6.9 5.4  HGB 8.7* 8.6*  HCT 27.5* 26.9*  PLT 208 205   Recent Labs    11/07/23 0233 11/08/23 0425  NA 140 137  K 4.0 4.4  CL 112* 112*  CO2 21* 20*  GLUCOSE 168* 217*  BUN 15 14  CREATININE 0.91 0.94  CALCIUM 8.2* 8.0*    Intake/Output Summary (Last 24 hours) at 11/08/2023 1610 Last data filed at 11/08/2023 9604 Gross per 24 hour  Intake 1340 ml  Output --  Net 1340 ml         Physical Exam: Vital Signs Blood pressure (!) 151/63, pulse 78, temperature 98.1 F (36.7 C), temperature source Oral, resp. rate 18, height 5\' 3"  (1.6 m), weight 86.2 kg, SpO2 98%.  General: No acute distress Mood and affect are appropriate Heart: Regular rate and rhythm no rubs murmurs or extra sounds Lungs: Clear to auscultation, breathing unlabored, no rales or wheezes Abdomen: Positive bowel sounds, soft nontender to palpation, nondistended Extremities: No clubbing, cyanosis, or edema Skin: No evidence of breakdown, no evidence of rash      Musculoskeletal:        General: Normal range of motion.     Cervical back: Neck supple. No tenderness.     Comments:  RUE- 3-/5  LUE- 5/5  RLE- 3-/5  LLE-5/5, stable 3/17  Neurological:     Mental Status: She is alert and oriented to person, place, and time.  Reduced sensation LT on right side including face  Bilateral mild ptosis  Psychiatric:        Mood and Affect: Mood normal.        Behavior: Behavior normal.   Assessment/Plan: 1. Functional deficits which require 3+ hours per day of interdisciplinary therapy in a comprehensive  inpatient rehab setting. Physiatrist is providing close team supervision and 24 hour management of active medical problems listed below. Physiatrist and rehab team continue to assess barriers to discharge/monitor patient progress toward functional and medical goals  Care Tool:  Bathing    Body parts bathed by patient: Right arm, Left arm, Chest, Abdomen, Right upper leg, Left upper leg, Face, Right lower leg, Left lower leg   Body parts bathed by helper: Front perineal area, Buttocks     Bathing assist Assist Level: Moderate Assistance - Patient 50 - 74%     Upper Body Dressing/Undressing Upper body dressing   What is the patient wearing?: Pull over shirt, Bra    Upper body assist Assist Level: Moderate Assistance - Patient 50 - 74%    Lower Body Dressing/Undressing Lower body dressing      What is the patient wearing?: Pants     Lower body assist Assist for lower body dressing: Minimal Assistance - Patient > 75%     Toileting Toileting    Toileting assist  Assist for toileting: Contact Guard/Touching assist     Transfers Chair/bed transfer  Transfers assist  Chair/bed transfer activity did not occur: Safety/medical concerns (nausea, lethargy)  Chair/bed transfer assist level: Contact Guard/Touching assist     Locomotion Ambulation   Ambulation assist   Ambulation activity did not occur: Safety/medical concerns (nausea, lethargy)  Assist level: Minimal Assistance - Patient > 75% Assistive device: Walker-rolling Max distance: 18ft   Walk 10 feet activity   Assist  Walk 10 feet activity did not occur: Safety/medical concerns (nausea, lethargy)  Assist level: Minimal Assistance - Patient > 75% Assistive device: Walker-rolling   Walk 50 feet activity   Assist Walk 50 feet with 2 turns activity did not occur: Safety/medical concerns (nausea, lethargy)  Assist level: Minimal Assistance - Patient > 75% Assistive device: Walker-rolling    Walk 150  feet activity   Assist Walk 150 feet activity did not occur: Safety/medical concerns (nausea, lethargy)         Walk 10 feet on uneven surface  activity   Assist Walk 10 feet on uneven surfaces activity did not occur: Safety/medical concerns (nausea, lethargy)         Wheelchair     Assist Is the patient using a wheelchair?: Yes Type of Wheelchair: Manual Wheelchair activity did not occur: Safety/medical concerns (nausea, lethargy)         Wheelchair 50 feet with 2 turns activity    Assist    Wheelchair 50 feet with 2 turns activity did not occur: Safety/medical concerns (nausea, lethargy)       Wheelchair 150 feet activity     Assist  Wheelchair 150 feet activity did not occur: Safety/medical concerns (nausea, lethargy)       Blood pressure (!) 151/63, pulse 78, temperature 98.1 F (36.7 C), temperature source Oral, resp. rate 18, height 5\' 3"  (1.6 m), weight 86.2 kg, SpO2 98%. Medical Problem List and Plan: 1. 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  Antithrombotics: -DVT/anticoagulation:  Pharmaceutical: Lovenox             -antiplatelet therapy: Aspirin and Plavix for three weeks followed by Plavix alone (has ICA stent)   3. Pain Management: Tylenol as needed             -Lyrica 75 mg BID   4. Mood/Behavior/Sleep: LCSW to evaluate and provide emotional support             -antipsychotic agents: n/a   5. Neuropsych/cognition: This patient is capable of making decisions on her own behalf.   6. Skin/Wound Care: Routine skin care checks   7. 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 Nausea: thought to be 2/2 constipation but has not resolved with constipation management- patient now having BM, improved later in the day  20. Right upper extremity pain: VAS [place] ordered to assess for clot, discussed that this was negative  21. Emesis: continue IV zofran, medicine consulted for their opinion, thyroid labs were ordered last night and are abnormal, improved later in the day  22. Chest pain/tachycardia: resolved neg V/Q scan, EKG neg  23. Hypokalemia: d/c potassium since it has normalized   24.  Mild DOE, no cough or fever, resp panel Neg, airborne prec d/ced  25.  Constipation: messaged nursing regarding when last BM was  26. Worsening left sided facial droop: CT head ordered and is stable  27. Hypotension/Dizziness: Avapro decreased to 75mg  daily  LOS: 6 days A FACE TO FACE EVALUATION WAS PERFORMED  Nikoloz Huy P [name] 11/08/2023,  9:09 AM

## 2023-11-08 NOTE — Progress Notes (Signed)
 Orthopedic Tech Progress Note Patient Details:  MARLICIA SROKA [birth year] [id number]  Ortho Devices Type of Ortho Device: Abdominal binder Ortho Device/Splint Location: STOMACH Ortho Device/Splint Interventions: OrderedRN/LPN called requesting an ABDOMINAL BINDER    Post Interventions Patient Tolerated: Well Instructions Provided: Care of device  Donald Pore 11/08/2023, 12:07 PM

## 2023-11-08 NOTE — Progress Notes (Signed)
 Blood sugar checked by patient with monitor/255 Patient's insulin pump provided 4.7 units

## 2023-11-08 NOTE — Progress Notes (Signed)
 Occupational Therapy Session Note  Patient Details  Name: [name] MRN: [id number] Date of Birth: Feb 19, 1951  Today's Date: 11/08/2023 OT Individual Time: 1050-1200 OT Individual Time Calculation (min): 70 min    Short Term Goals: Week 1:  OT Short Term Goal 1 (Week 1): Pt will complete sit > stand with CGA OT Short Term Goal 2 (Week 1): Pt will complete toilet transfer with CGA using LRAD OT Short Term Goal 3 (Week 1): Pt will donn LB clothing supervision using AE PRN  Skilled Therapeutic Interventions/Progress Updates:  Skilled OT intervention completed with focus on orthostatics, activity tolerance, functional transfers/endurance, R NMR. Pt received semi supine in bed, agreeable to session. No pain reported.  Pt verbalized dizziness at rest and poor morning with her dizziness symptoms and stat CT however per nursing pt clear and stable for therapy. BP assessed during session as seen below.  Transitioned to EOB with min A for RLE. CGA sit > stand using RW, then short ambulatory transfer with RW > w/c. Transported dependently in w/c <> gym.  CGA sit > stand and stand pivot with RW > EOM. Pt participated in the following dynamic standing balance and endurance tasks to promote independence and safety during BADLs and functional mobility: -placing and retrieving squigz from long mirror. CGA sit > stand for each squigz (total of 10) without AD, and CGA needed for balance when not dizzy, and min A when dizzy. Rest breaks needed for dizziness and fatigue. RUE mildly impaired with reaching higher than shoulder level  CGA sit > stand and stand pivot with RW > w/c, and similar transfer > EOB. Min A for RLE during sit > supine. Pt remained upright in bed, with bed alarm on/activated, and with all needs in reach at end of session.  Vitals BP 146/52 (semi supine); symptomatic- dizziness 5/10 BP 153/67 (sitting EOB); symptomatic, 4/10 BP 117/51 (sitting after transfer to w/c); symptomatic,  4.5/10 BP 140/62 (after 10 sit > stands); symptomatic, 3/10 BP 164/63 (after extended rest); symptomatic   Therapy Documentation Precautions:  Precautions Precautions: Fall Precaution/Restrictions Comments: R hemiparesis Restrictions Weight Bearing Restrictions Per Provider Order: No    Therapy/Group: Individual Therapy  Melvyn Novas, MS, OTR/L  11/08/2023, 12:19 PM

## 2023-11-08 NOTE — Consult Note (Signed)
 Initial Consultation Note   Patient: [name] WUJ:[id number] DOB: [birth year] PCP: Irena Reichmann, DO DOA: 11/02/2023 DOS: the patient was seen and examined on 11/08/2023 Primary service: Horton Chin, MD  Referring physician: Carlis Abbott Reason for consult: medical maangement    Assessment and Plan:  History of CVA Rght pontine stroke thought secondary to small vessel disease Ultrasound of the carotids noted right ICA gnosis at 40 to 59% and patent stent of the left internal carotid Placed on aspirin and Plavix for 3 weeks then Plavix alone Transferred to CIR for further rehab Reported new L facial droop today and so head CT was ordered; however, she reports that she had not previously looked and so isn't sure if it is really new   SIRS  Patient was noted to be tachycardic overnight with WBC elevated up to 12.6 Procalcitonin was noted to be less than 0.1 Chest x-ray showed no acute abnormality Urinalysis did not show significant signs for infection Possibly reactive due to nausea and vomiting   Dizziness Improving and not restricting her ability to participate in therapy Not orthostatic Renal function is at baseline Has been restarted on HTN medication   Nausea and vomiting Resolved with treatment of constipation   Prolonged QT interval EKG revealed a normal sinus rhythm at 94 bpm with right bundle branch block and QTc 502.   Rechecked EKG with resolution   Uncontrolled diabetes mellitus type 2, with long-term use of insulin Patient on insulin pump and Jardiance at home Hemoglobin A1c was noted to be 9.2, poor control Continue insulin pump and Jardiance Diabetic education consulted to assist   Hypertension Resume olmesartan 20 mg daily (irbesartan 150 mg daily per formulary) Will add prn PO hydralazine prn SBP >160 in the setting of recent CVA   Ocular myasthenia gravis Patient followed by [facility] in the outpatient setting Receives Rituxan and IVIG every 6  weeks Continue prednisone and CellCept    Hypothyroidism H/p thyroidectomy due to thyroid CA TSH noted to be 0.02 which appears to be improved from most recent check on 3/10 of 0.012 Records note Synthroid dose had been decreased from 200 mcg down to 100 mcg during her most recent hospitalization Continue current dose of levothyroxine Recommend rechecking TSH in 4 weeks given recent dose adjustment/following up with endocrinology   Normocytic anemia Hgb 8.7 She has had fairly significant variability so this may be baseline Recheck in AM   Hyperlipidemia Continue Crestor, Zetia, and Repatha   History of breast cancer Continue tamoxifen       TRH will sign off at present, please call us again when needed.  HPI: [name] is a 73 y.o. female with past medical history of HTN, HLD, CVA, DM on insulin pump, myasthenia gravis, OSA, and thyroid/breast cancers s/p treatment who presented on 3/7 with acute CVA.  She was discharged to CIR on 3/11.  She participated in therapy this AM and is now fatigued.  Feeling at or better than prior.  No new issues.  Review of Systems: As mentioned in the history of present illness. All other systems reviewed and are negative. Past Medical History:  Diagnosis Date   Arthritis    "all over my body" (03/13/2013)   Asthma    Asymptomatic carotid artery stenosis, bilateral 10/08/2018   Breast cancer (HCC)    GERD (gastroesophageal reflux disease)    H/O hiatal hernia    Headache    pt states she has had headaches for about 6 months "off  and on"   Heart murmur    pt had echocardiogram on 09/17/21   Hypercholesterolemia 10/08/2018   Hypertension    Hypothyroidism    Myasthenia gravis (HCC)    "in my eyes; diagnsosed > 7 yr ago" (03/13/2013)   Sleep apnea    on CPAP   Stroke (HCC) 2021   Thyroid carcinoma (HCC)    Type II diabetes mellitus (HCC)    Past Surgical History:  Procedure Laterality Date   ABDOMINAL HYSTERECTOMY     ANTERIOR  CERVICAL DECOMP/DISCECTOMY FUSION     "I've had severa ORs; always went in from the front" (03/13/2013)   APPENDECTOMY     BREAST LUMPECTOMY WITH RADIOACTIVE SEED AND SENTINEL LYMPH NODE BIOPSY Left 09/02/2022   Procedure: LEFT BREAST LUMPECTOMY WITH RADIOACTIVE SEED AND SENTINEL LYMPH NODE BIOPSY;  Surgeon: Abigail Miyamoto, MD;  Location: [facility] OR;  Service: General;  Laterality: Left;   CARDIAC CATHETERIZATION     "several" (03/13/2013)   CARPAL TUNNEL RELEASE Right    CATARACT EXTRACTION W/ INTRAOCULAR LENS  IMPLANT, BILATERAL Bilateral    CHOLECYSTECTOMY     KNEE ARTHROSCOPY Left    SHOULDER ARTHROSCOPY W/ ROTATOR CUFF REPAIR Left    TONSILLECTOMY     TOTAL THYROIDECTOMY     TRANSCAROTID ARTERY REVASCULARIZATION  Left 09/24/2021   Procedure: LEFT TRANSCAROTID ARTERY REVASCULARIZATION;  Surgeon: Leonie Douglas, MD;  Location: [facility] OR;  Service: Vascular;  Laterality: Left;   TRANSCAROTID ARTERY REVASCULARIZATION  Right 10/23/2021   Procedure: Right Transcarotid Artery Revascularization;  Surgeon: Leonie Douglas, MD;  Location: [facility] OR;  Service: Vascular;  Laterality: Right;   ULTRASOUND GUIDANCE FOR VASCULAR ACCESS Right 09/24/2021   Procedure: ULTRASOUND GUIDANCE FOR VASCULAR ACCESS;  Surgeon: Leonie Douglas, MD;  Location: [facility] OR;  Service: Vascular;  Laterality: Right;   ULTRASOUND GUIDANCE FOR VASCULAR ACCESS Right 10/23/2021   Procedure: ULTRASOUND GUIDANCE FOR VASCULAR ACCESS, LEFT FEMORAL VEIN;  Surgeon: Leonie Douglas, MD;  Location: [facility] OR;  Service: Vascular;  Laterality: Right;   Social History:  reports that she has never smoked. She has never used smokeless tobacco. She reports that she does not drink alcohol and does not use drugs.  Allergies  Allergen Reactions   Contrast Media [Iodinated Contrast Media] Anaphylaxis    01/10/15 and 09/18/2021 --PT GIVEN 13 HR PRE MEDS FOR CT with contrast TOLERATED IV CONTRAST W/O ANY REACTION   Fluorescein Shortness Of Breath and Other  (See Comments)    (Dye)   Iodine Anaphylaxis    Contrast dye - iodine   Magnesium-Containing Compounds Other (See Comments)    H/O myasthenia gravis- caution replacing IV    Molds & Smuts Shortness Of Breath and Other (See Comments)    Congestion and wheezing, also   Shellfish-Derived Products Anaphylaxis, Shortness Of Breath, Swelling and Other (See Comments)    Welts, also   Pholcodine Hives   Azithromycin Nausea Only   Codeine Hives and Nausea Only   Metformin Hcl Er Nausea Only   Oxycodone Nausea Only    Nausea 08/2022 admission.   Tramadol Hcl Nausea Only   Other Rash and Other (See Comments)    Coban causes welts, also    Family History  Problem Relation Age of Onset   Coronary artery disease Sister        s/p coronary stenting   Stroke Sister 59   Diabetes Sister    Congestive Heart Failure Mother    Asthma Mother    Diabetes  Mother    Congestive Heart Failure Father    Lung cancer Father    Asthma Father    Diabetes Father    Diabetes Brother     Prior to Admission medications   Medication Sig Start Date End Date Taking? Authorizing Provider  acetaminophen (TYLENOL) 500 MG tablet Take 500 mg by mouth every 6 (six) hours as needed for moderate pain.    [provider]  aspirin EC 81 MG tablet Take 1 tablet (81 mg total) by mouth daily for 18 days. 11/02/23 11/20/23  Elgergawy, Leana Roe, MD  clopidogrel (PLAVIX) 75 MG tablet Take 1 tablet (75 mg total) by mouth daily. 11/03/23   Elgergawy, Leana Roe, MD  diphenoxylate-atropine (LOMOTIL) 2.5-0.025 MG tablet Take 1-2 tablets by mouth 4 (four) times daily as needed for diarrhea or loose stools. 10/28/22   Pollyann Samples, NP  ELDERBERRY PO Take 1 tablet by mouth daily.    [provider]  empagliflozin (JARDIANCE) 25 MG TABS tablet Take 25 mg by mouth daily.    [provider]  esomeprazole (NEXIUM) 40 MG capsule Take 1 capsule (40 mg total) by mouth 2 (two) times daily. 01/08/20   Angiulli, Mcarthur Rossetti, PA-C  Evolocumab with Infusor (REPATHA PUSHTRONEX SYSTEM) 420 MG/3.5ML SOCT Inject 420 mg into the skin every 30 (thirty) days.    [provider]  Exenatide ER (BYDUREON BCISE) 2 MG/0.85ML AUIJ Inject 2 mg into the skin every Monday.    [provider]  ezetimibe (ZETIA) 10 MG tablet TAKE 1 TABLET BY MOUTH EVERY DAY 01/16/21   Usbaldo Pannone Decamp, MD  Immune Globulin, Human, 40 GM/400ML SOLN Inject into the vein every 6 (six) weeks. Infusions are administered at home by home health nurse - last infusion 09/08/21 and 09/09/21; next infusion due 10/19/21 and 10/20/21 02/21/20   [provider]  insulin lispro (HUMALOG) 100 UNIT/ML injection Inject into the skin See admin instructions. Inject subcutaneously 3 (three) times daily with meals With meals through the Omnipod Dash Pump every 72 hours as directed    [provider]  levothyroxine (SYNTHROID) 100 MCG tablet Take 1 tablet (100 mcg total) by mouth daily at 6 (six) AM. 11/03/23   Elgergawy, Leana Roe, MD  lidocaine-prilocaine (EMLA) cream Apply to affected area once 10/15/22   Malachy Mood, MD  loperamide (IMODIUM A-D) 2 MG tablet Take 2 mg by mouth 4 (four) times daily as needed for diarrhea or loose stools.    [provider]  loratadine (CLARITIN) 10 MG tablet Take 1 tablet (10 mg total) by mouth at bedtime. 01/08/20   Angiulli, Mcarthur Rossetti, PA-C  meclizine (ANTIVERT) 25 MG tablet Take 25 mg by mouth 3 (three) times daily as needed for dizziness or nausea (or migraine-related nausea). 06/27/13   [provider]  montelukast (SINGULAIR) 10 MG tablet Take 1 tablet (10 mg total) by mouth every morning. 01/08/20   Angiulli, Mcarthur Rossetti, PA-C  mycophenolate (CELLCEPT) 500 MG tablet Take 1,000 mg by mouth 2 (two) times daily. For myasthenia gravis 09/04/21   [provider]  olmesartan (BENICAR) 20 MG tablet Take 1 tablet (20 mg total) by mouth daily. 01/08/20   Angiulli, Mcarthur Rossetti, PA-C  ondansetron (ZOFRAN) 8 MG  tablet TAKE 1 TABLET BY MOUTH EVERY 8  HOURS AS NEEDED FOR NAUSEA AND  VOMITING 08/31/23   Carlean Jews, NP  predniSONE (DELTASONE) 2.5 MG tablet Take 7.5 mg by mouth every morning. For myasthenia gravis  [provider]  pregabalin (LYRICA) 25 MG capsule Take 1 capsule (25 mg total) by mouth 2 (two) times daily. 07/28/23   Pollyann Samples, NP  PRESCRIPTION MEDICATION Pt uses a cpap nightly    [provider]  PROAIR HFA 108 (90 BASE) MCG/ACT inhaler Inhale 2 puffs into the lungs every 6 (six) hours as needed for wheezing or shortness of breath.  06/02/13   [provider]  prochlorperazine (COMPAZINE) 10 MG tablet Take 1 tablet (10 mg total) by mouth every 6 (six) hours as needed for nausea or vomiting. 10/07/22   Malachy Mood, MD  riTUXimab (RITUXAN) 500 MG/50ML injection Inject into the vein every 6 (six) months.    [provider]  rosuvastatin (CRESTOR) 40 MG tablet Take 1 tablet (40 mg total) by mouth daily. 02/13/21 04/22/24  Catricia Scheerer Decamp, MD  tamoxifen (NOLVADEX) 20 MG tablet Take 1 tablet (20 mg total) by mouth daily. 06/07/23   Malachy Mood, MD  topiramate (TOPAMAX) 50 MG tablet TAKE 1 TABLET BY MOUTH AT  BEDTIME 10/04/23   Ihor Austin, NP  vitamin B-12 (CYANOCOBALAMIN) 1000 MCG tablet Take 1,000 mcg by mouth once a week.    [provider]  Vitamin D, Ergocalciferol, (DRISDOL) 50000 UNITS CAPS capsule Take 50,000 Units by mouth every Monday.  12/12/13   [provider]    Physical Exam: Vitals:   11/07/23 1014 11/07/23 1428 11/07/23 1921 11/08/23 0435  BP: (!) 185/84 (!) 115/49 120/61 (!) 151/63  Pulse: 100 98 96 78  Resp:  20 18 18   Temp:  98.6 F (37 C) 97.9 F (36.6 C) 98.1 F (36.7 C)  TempSrc:  Oral Oral Oral  SpO2:  99% 100% 98%  Weight:      Height:         Intake/Output Summary (Last 24 hours) at 11/08/2023 0851 Last data filed at 11/08/2023 1478 Gross per 24 hour  Intake 1340 ml  Output --  Net 1340 ml   Filed  Weights   11/03/23 1405  Weight: 86.2 kg    Exam:  General:  Appears calm and comfortable and is in NAD Eyes:  EOMI, normal lids, iris ENT:  grossly normal hearing, lips & tongue, mmm Cardiovascular:  RRR, no m/r/g. No LE edema.  Respiratory:   CTA bilaterally with no wheezes/rales/rhonchi.  Normal respiratory effort. Abdomen:  soft, NT, ND Skin:  no rash or induration seen on limited exam Musculoskeletal:  grossly normal tone BUE/BLE, good ROM, no bony abnormality Psychiatric:  grossly normal mood and affect, speech fluent and appropriate, AOx3 Neurologic:  CN 2-12 grossly intact other than very subtle L facial droop, moves all extremities in coordinated fashion  Data Reviewed: I have reviewed the patient's lab results since admission.  Pertinent labs for today include:  CO2 20, stable Glucose 217 WBC 5.4 Hgb 8.6, 8.7 on 3/16     Family Communication: None present  Thank you very much for involving [place] in the care of your patient.  She appears to be progressing and TRH will sign off at this time.  Please feel free to reconsult should additional assistance be needed.  Author: Jonah Blue, MD 11/08/2023 8:50 AM  For on call review [email or web address].

## 2023-11-08 NOTE — Progress Notes (Signed)
 Physical Therapy Session Note  Patient Details  Name: [name] MRN: [id number] Date of Birth: 1950/09/07  Today's Date: 11/08/2023 PT Individual Time: 0731-0826 and 1300-1411 PT Individual Time Calculation (min): 55 min and 71 min  Short Term Goals: Week 1:  PT Short Term Goal 1 (Week 1): pt will perform bed mobility with CGA overall PT Short Term Goal 2 (Week 1): pt will perform transfers with LRAD and CGA PT Short Term Goal 3 (Week 1): pt will ambulate 51ft with LRAD and CGA  Skilled Therapeutic Interventions/Progress Updates:   Treatment Session 1 Received pt semi-reclined in bed, pt agreeable to PT treatment, and did not state pain level during session. Pt reported having a "up and down" weekend but stated "last night I thought I was gonna die" - pt reports turning her head to the side and the room began spinning and she became extremely dizzy. Pt reports adjustment to her BP medications and believes that was why she experienced those symptoms although may benefit from vestibular evaluation.  Session with emphasis on functional mobility/transfers, toileting, generalized strengthening and endurance, dynamic standing balance/coordination, and ambulation. Pt transferred semi-reclined<>sitting R EOB with HOB elevated and very light min A for RLE management. Pt reporting mild lightheadedness in sitting that decreased with time. Pt reported urge to void - stood from low sitting EOB with RW and light min A and ambulated in/out of bathroom with RW and CGA. Pt able to manage clothing with CGA for balance and void, have BM, and perform hygiene management without assist.    Pt sat in WC at sink and performed hand hygiene, washed face, and brushed teeth with set up assist. While at sink, noted new mild facial droop on L side - RN, MD, and PA notified. RN and pt requesting "low level" seated exercise. Donned knee high teds dependently for BP management and pt performed the following seated exercises  with emphasis on LE strength/ROM and to increase BP: -hip flexion 2x20 on LLE and 2x10 on RLE (decreased ROM on RLE) -knee extension 1x20 on LLE and 1x15 on RLE (decreased ROM on RLE) Transferred WC<>bed via stand<>pivot with RW and CGA and sit<>supine with min A for RLE management. Pt then reporting new tingling in R hand - RN notified. Concluded session with pt semi-reclined in bed, needs within reach, and bed alarm on - stat CT ordered  Vitals: Semi-reclined at rest - BP: 137/60, HR 83bpm, SPO2 99% Sitting in WC after toileting - BP: 108/46 - pt reporting lightheadedness Sitting in WC after washing up at sink - BP: 102/59 - pt reporting lightheadedness Sitting after returning to supine - BP: 122/54 - pt reporting improvement in symptoms   Treatment Session 2 Received pt semi-reclined in bed eating lunch. Pt agreeable to PT treatment and reported headache - requested to wait until end of session for pain medication. Session with emphasis on functional mobility/transfers, generalized strengthening and endurance, dynamic standing balance/coordination, and gait training. Pt transferred semi-reclined<>sitting R EOB with HOB elevated and very light min A for RLE management.   Pt performed all transfers with RW and CGA throughout session. Pt transported to/from room in [facility] dependently for energy conservation purposes. Pt ambulated 14ft x 1 and 74ft x 1 with RW and CGA with WC follow - limited by fatigue and soreness in R quad. Attempted to perform alternating toe taps to 3in step without UE support, but pt unable. Performed alternating toe taps to 3in step with BUE support on RW and  CGA x10 bilaterally with emphasis on R hip flexor strength. Pt reporting intermittent lightheadedness/dizziness but better than this morning. Returned to room and ambulated 59ft with RW and CGA to bed. Transferred into supine with min A for BLE management. Concluded session with pt semi-reclined in bed, needs within reach, and  bed alarm on.   Vitals: Semi-reclined in bed at rest: BP: 170/75 HR 94bpm Sitting EOB: BP: 148/75 Standing: 144/64 Sitting after ambulating: BP: 150/70 Sitting after ~3 minutes: BP: 150/61 Sitting after ambulating trial 2: 126/52 Semi-reclined in bed: 143/64  Therapy Documentation Precautions:  Precautions Precautions: Fall Precaution/Restrictions Comments: R hemiparesis Restrictions Weight Bearing Restrictions Per Provider Order: No  Therapy/Group: Individual Therapy Marlana Salvage Zaunegger Blima Rich PT, DPT 11/08/2023, 6:45 AM

## 2023-11-08 NOTE — Progress Notes (Signed)
 PT reported to this RN of patient c/o of new left side mouth drooping. Denies any other symptoms. Vitals checked WDL, MD notified by phone and CT scan wo contrast STAT ordered.

## 2023-11-09 DIAGNOSIS — I6350 Cerebral infarction due to unspecified occlusion or stenosis of unspecified cerebral artery: Secondary | ICD-10-CM | POA: Diagnosis not present

## 2023-11-09 LAB — GLUCOSE, CAPILLARY
Glucose-Capillary: 44 mg/dL — CL (ref 70–99)
Glucose-Capillary: 133 mg/dL — ABNORMAL HIGH (ref 70–99)
Glucose-Capillary: 317 mg/dL — ABNORMAL HIGH (ref 70–99)
Glucose-Capillary: 303 mg/dL — ABNORMAL HIGH (ref 70–99)

## 2023-11-09 LAB — CBC WITH DIFFERENTIAL/PLATELET
Abs Immature Granulocytes: 0.02 10*3/uL (ref 0.00–0.07)
Basophils Absolute: 0 10*3/uL (ref 0.0–0.1)
Basophils Relative: 1 %
Eosinophils Absolute: 0.1 10*3/uL (ref 0.0–0.5)
Eosinophils Relative: 2 %
HCT: 28.1 % — ABNORMAL LOW (ref 36.0–46.0)
Hemoglobin: 9.1 g/dL — ABNORMAL LOW (ref 12.0–15.0)
Immature Granulocytes: 0 %
Lymphocytes Relative: 28 %
Lymphs Abs: 1.7 10*3/uL (ref 0.7–4.0)
MCH: 30.3 pg (ref 26.0–34.0)
MCHC: 32.4 g/dL (ref 30.0–36.0)
MCV: 93.7 fL (ref 80.0–100.0)
Monocytes Absolute: 0.6 10*3/uL (ref 0.1–1.0)
Monocytes Relative: 10 %
Neutro Abs: 3.6 10*3/uL (ref 1.7–7.7)
Neutrophils Relative %: 59 %
Platelets: 228 10*3/uL (ref 150–400)
RBC: 3 MIL/uL — ABNORMAL LOW (ref 3.87–5.11)
RDW: 12.1 % (ref 11.5–15.5)
WBC: 6.1 10*3/uL (ref 4.0–10.5)
nRBC: 0 % (ref 0.0–0.2)

## 2023-11-09 LAB — BASIC METABOLIC PANEL
Anion gap: 7 (ref 5–15)
BUN: 15 mg/dL (ref 8–23)
CO2: 21 mmol/L — ABNORMAL LOW (ref 22–32)
Calcium: 8.4 mg/dL — ABNORMAL LOW (ref 8.9–10.3)
Chloride: 111 mmol/L (ref 98–111)
Creatinine, Ser: 0.91 mg/dL (ref 0.44–1.00)
GFR, Estimated: >60 mL/min (ref >60)
Glucose, Bld: 132 mg/dL — ABNORMAL HIGH (ref 70–99)
Potassium: 3.3 mmol/L — ABNORMAL LOW (ref 3.5–5.1)
Sodium: 139 mmol/L (ref 135–145)

## 2023-11-09 MED ORDER — POTASSIUM CHLORIDE CRYS ER 20 MEQ PO TBCR: 40.0000 meq | EXTENDED_RELEASE_TABLET | Freq: Once | ORAL | Status: DC

## 2023-11-09 MED ORDER — POTASSIUM CHLORIDE 20 MEQ PO PACK
40.0000 meq | PACK | Freq: Once | ORAL | Status: DC
  Filled 2023-11-09: qty 2

## 2023-11-09 NOTE — Inpatient Diabetes Management (Signed)
 Inpatient Diabetes Program Recommendations  AACE/ADA: New Consensus Statement on Inpatient Glycemic Control (2015)  Target Ranges:  Prepandial:   less than 140 mg/dL      Peak postprandial:   less than 180 mg/dL (1-2 hours)      Critically ill patients:  140 - 180 mg/dL   Lab Results  Component Value Date   GLUCAP 133 (H) 11/09/2023   HGBA1C 9.2 (H) 10/29/2023    Review of Glycemic Control  Latest Reference Range & Units 11/06/23 16:39 11/07/23 14:51 11/07/23 17:04 11/08/23 12:20 11/08/23 21:32 11/09/23 06:21 11/09/23 07:11  Glucose-Capillary 70 - 99 mg/dL 161 (H) 096 (H) 045 (H) 258 (H) 314 (H) 44 (LL) 133 (H)  (LL): Data is critically low (H): Data is abnormally high Diabetes history: Type 2 DM  Outpatient Diabetes medications:  Tresiba 15 units daily , Jardiance 25 mg qd, Bydurian 2 mg qweek, omnipod insulin pump (using Humalog insulin)- last visible settings for insulin pump were as follows from 10/23/21:   Basal insulin  0000-0400       1.0 units/hour  0401-1300       1.4 units/hour  1301-1800       1.65 units/hour  1801-0000       1.45 units/hour  Total daily basal insulin: 33.55 units/24 hours  Current orders for Inpatient glycemic control: insulin pump, Jardiance 25 mg QD  Prednisone 7.5 mg every day   Inpatient Diabetes Program Recommendations:    Noted hypoglycemia this AM of 44 mg/dL. Patient states that she bolused to cover a bedtime snack, however, there have been issues with obtaining the snack from dietary. Discussed with Misty Stanley, LPN that this has hopefully been resolved. Order is placed per MD and patient does have additional snack items at bedside. Since there have been issues with obtaining snacks, encouraged to bolus when visualizing snack to prevent hypoglycemia. Additionally noted that patient's glucose trends have been elevated, usually >200's mg/dL. Patient's doses have been dosed to include the Prednisone by outpatient endocrinology.   Of note, for past two days  patient has missed lunchtime bolus to cover lunch. When asked about this, she reports that she gave the bolus dosing, however nothing per nursing was documented. In discussing further, patient states that nursing staff is not documenting everything she is doing in her pump.  Encouraged target range goals of 80-180 mg/dL. If trends continue to exceed 180 mg/dL we may have to make insulin adjustments.  Patient is followed by Dr Talmage Nap, outpatient endocrinology. Reports that she was supposed to have a visit since she has been in the hospital, encouraged to reschedule.   Thanks, Lujean Rave, MSN, RNC-OB Diabetes Coordinator [phone] (8a-5p)

## 2023-11-09 NOTE — Progress Notes (Signed)
 PROGRESS NOTE   Subjective/Complaints: Patient's blood sugar was 44 this morning, said she never received HS snack last night, asked nursing why that may be, she said overnight nurse did call down for it  ROS: +R side weakness and numbness , -nausea, -emesis, no joint pain , +dizziness, +ocular myasthenia gravis  Objective:   CT HEAD WO CONTRAST ( ) Result Date: 11/08/2023 CLINICAL DATA:  Facial paralysis, left facial droop. EXAM: CT HEAD WITHOUT CONTRAST TECHNIQUE: Contiguous axial images were obtained from the base of the skull through the vertex without intravenous contrast. RADIATION DOSE REDUCTION: This exam was performed according to the departmental dose-optimization program which includes automated exposure control, adjustment of the mA and/or kV according to patient size and/or use of iterative reconstruction technique. 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 No evidence of acute infarct, hemorrhage, hydrocephalus, mass, or collection. Vascular: No hyperdense vessel or unexpected calcification. Skull: Normal. Negative for fracture or focal lesion. Sinuses/Orbits: No acute finding. IMPRESSION: Known lacunar infarcts in the brainstem and thalami. No acute finding. Electronically Signed   By: Tiburcio Pea M.D.   On: 11/08/2023 09:27    Recent Labs    11/08/23 0425 11/09/23 0351  WBC 5.4 6.1  HGB 8.6* 9.1*  HCT 26.9* 28.1*  PLT 205 228   Recent Labs    11/08/23 0425 11/09/23 0351  NA 137 139  K 4.4 3.3*  CL 112* 111  CO2 20* 21*  GLUCOSE 217* 132*  BUN 14 15  CREATININE 0.94 0.91  CALCIUM 8.0* 8.4*    Intake/Output Summary (Last 24 hours) at 11/09/2023 0951 Last data filed at 11/08/2023 1812 Gross per 24 hour  Intake 480 ml  Output --  Net 480 ml         Physical Exam: Vital Signs Blood pressure (!) 151/71, pulse 77, temperature 98 F  (36.7 C), temperature source Oral, resp. rate 16, height 5\' 3"  (1.6 m), weight 86.2 kg, SpO2 99%.  General: No acute distress Mood and affect are appropriate Heart: Regular rate and rhythm no rubs murmurs or extra sounds Lungs: Clear to auscultation, breathing unlabored, no rales or wheezes Abdomen: Positive bowel sounds, soft nontender to palpation, nondistended Extremities: No clubbing, cyanosis, or edema Skin: No evidence of breakdown, no evidence of rash      Musculoskeletal:        General: Normal range of motion.     Cervical back: Neck supple. No tenderness.     Comments:  RUE- 3-/5  LUE- 5/5  RLE- 3-/5  LLE-5/5, stable 3/18  Neurological:     Mental Status: She is alert and oriented to person, place, and time.  Reduced sensation LT on right side including face  Bilateral mild ptosis  Psychiatric:        Mood and Affect: Mood normal.        Behavior: Behavior normal.   Assessment/Plan: 1. Functional deficits which require 3+ hours per day of interdisciplinary therapy in a comprehensive inpatient rehab setting. Physiatrist is providing close team supervision and 24 hour management of active medical problems listed below. Physiatrist and rehab team continue to assess barriers  to discharge/monitor patient progress toward functional and medical goals  Care Tool:  Bathing    Body parts bathed by patient: Right arm, Left arm, Chest, Abdomen, Right upper leg, Left upper leg, Face, Right lower leg, Left lower leg   Body parts bathed by helper: Front perineal area, Buttocks     Bathing assist Assist Level: Moderate Assistance - Patient 50 - 74%     Upper Body Dressing/Undressing Upper body dressing   What is the patient wearing?: Pull over shirt, Bra    Upper body assist Assist Level: Moderate Assistance - Patient 50 - 74%    Lower Body Dressing/Undressing Lower body dressing      What is the patient wearing?: Pants     Lower body assist Assist for lower  body dressing: Minimal Assistance - Patient > 75%     Toileting Toileting    Toileting assist Assist for toileting: Contact Guard/Touching assist     Transfers Chair/bed transfer  Transfers assist  Chair/bed transfer activity did not occur: Safety/medical concerns (nausea, lethargy)  Chair/bed transfer assist level: Contact Guard/Touching assist     Locomotion Ambulation   Ambulation assist   Ambulation activity did not occur: Safety/medical concerns (nausea, lethargy)  Assist level: Contact Guard/Touching assist Assistive device: Walker-rolling Max distance: 192ft   Walk 10 feet activity   Assist  Walk 10 feet activity did not occur: Safety/medical concerns (nausea, lethargy)  Assist level: Contact Guard/Touching assist Assistive device: Walker-rolling   Walk 50 feet activity   Assist Walk 50 feet with 2 turns activity did not occur: Safety/medical concerns (nausea, lethargy)  Assist level: Contact Guard/Touching assist Assistive device: Walker-rolling    Walk 150 feet activity   Assist Walk 150 feet activity did not occur: Safety/medical concerns (nausea, lethargy)         Walk 10 feet on uneven surface  activity   Assist Walk 10 feet on uneven surfaces activity did not occur: Safety/medical concerns (nausea, lethargy)         Wheelchair     Assist Is the patient using a wheelchair?: Yes Type of Wheelchair: Manual Wheelchair activity did not occur: Safety/medical concerns (nausea, lethargy)         Wheelchair 50 feet with 2 turns activity    Assist    Wheelchair 50 feet with 2 turns activity did not occur: Safety/medical concerns (nausea, lethargy)       Wheelchair 150 feet activity     Assist  Wheelchair 150 feet activity did not occur: Safety/medical concerns (nausea, lethargy)       Blood pressure (!) 151/71, pulse 77, temperature 98 F (36.7 C), temperature source Oral, resp. rate 16, height 5\' 3"  (1.6 m),  weight 86.2 kg, SpO2 99%. Medical Problem List and Plan: 1. 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  Antithrombotics: -DVT/anticoagulation:  Pharmaceutical: Lovenox             -antiplatelet therapy: Aspirin and Plavix for three weeks followed by Plavix alone (has ICA stent)   3. Pain Management: Tylenol as needed             -  Lyrica 75 mg BID   4. Mood/Behavior/Sleep: LCSW to evaluate and provide emotional support             -antipsychotic agents: n/a   5. Neuropsych/cognition: This patient is capable of making decisions on her own behalf.   6. Skin/Wound Care: Routine skin care checks   7. 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 at home) - likely need to increase to 100 mg QHS_ having HA's 5-6/10 daily now   14: OSA on CPAP   15: History of breast cancer: continue tamoxifen   16: CKD stage II: Serum creatinine at  or near baseline -follow-up BMP   17: Chronic anemia: improving, monitor weekly   18: Leukopenia: resolved, monitor weekly   19. Nausea: thought to be 2/2 constipation but has not resolved with constipation management- patient now having BM, improved later in the day  20. Right upper extremity pain: VAS [place] ordered to assess for clot, discussed that this was negative  21. Emesis: continue IV zofran, medicine consulted for their opinion, thyroid labs were ordered last night and are abnormal, improved later in the day  22. Chest pain/tachycardia: resolved neg V/Q scan, EKG neg  23. Hypokalemia: d/c potassium since patient does not like supplement, encouraged her to eat banana today, will repeat potassium level tomorrow   24.  Mild DOE, no cough or fever, resp panel Neg, airborne prec d/ced  25.  Constipation: messaged nursing regarding when last BM was  26. Worsening left sided facial droop: CT head ordered and is stable  27. Hypotension/Dizziness: Avapro decreased to 75mg  daily  28. Anemia: Hgb reviewed and is improving    LOS: 7 days A FACE TO FACE EVALUATION WAS PERFORMED  Drema Pry Alexei Ey 11/09/2023, 9:51 AM

## 2023-11-09 NOTE — Progress Notes (Signed)
 Physical Therapy Session Note  Patient Details  Name: [name] MRN: [id number] Date of Birth: 27-Jul-1951  Today's Date: 11/09/2023 PT Individual Time: 1100-1156 PT Individual Time Calculation (min): 56 min   Short Term Goals: Week 1:  PT Short Term Goal 1 (Week 1): pt will perform bed mobility with CGA overall PT Short Term Goal 2 (Week 1): pt will perform transfers with LRAD and CGA PT Short Term Goal 3 (Week 1): pt will ambulate 70ft with LRAD and CGA  Skilled Therapeutic Interventions/Progress Updates:   Received pt sitting in [facility] with family present for family education training - handoff from OT. Pt agreeable to PT treatment but reported feeling nauseous and having headache. RN notified of request for anti-nausea medication and present to administer. Pt reporting adjustment in BP medication has not helped and made symptoms worse - MD made aware. Session with emphasis on discharge planning, functional mobility/transfers, dressing, and generalized strengthening and endurance.  Pt performed all transfers with RW and CGA throughout session. Pt ambulated 108ft to bed with RW and CGA. Pt requested to change clothes - removed dirty underwear and pants with max A and pt stood and washed lower body with CGA for balance. Donned clean underwear and pants sitting EOB with max A provided by sister. Removed dirty shirt, washed upper body and applied lotions with set up assist, and donned shirt with supervision. Bed set to simulate height at home and attempted to use gait belt as leg lifter to promote independence with bed mobility, however pt too dizzy - ultimately required mod A for BLE management to transition into supine. Concluded session with pt semi-reclined in bed, needs within reach, and bed alarm on.   Educated and discussed the following with pt and family: -getting temporary ramp installed for 2 STE (looked on [employer]) -walker bag for RW -using reacher   -pt's sister providing intermittent  assist and coming to check on pt throughout the day -returning for additional family education prior to discharge.   Vitals: - BP sitting in WC: 122/48 - BP standing: 133/67 - pt unable to keep eyes open - BP sitting after talking: 153/67  Therapy Documentation Precautions:  Precautions Precautions: Fall Precaution/Restrictions Comments: R hemiparesis Restrictions Weight Bearing Restrictions Per Provider Order: No  Therapy/Group: Individual Therapy Marlana Salvage Zaunegger Blima Rich PT, DPT 11/09/2023, 6:55 AM

## 2023-11-09 NOTE — Progress Notes (Signed)
 Pt BS now resulting at [address], LPN

## 2023-11-09 NOTE — Progress Notes (Signed)
 Hypoglycemic Event  CBG: 44  Treatment: 8 oz juice/soda and breakfast tray  Symptoms: Shaky  Follow-up CBG: Time:6:45 CBG Result:  Possible Reasons for Event: Medication regimen:    Comments/MD notified:Dan, PA    [employer]

## 2023-11-09 NOTE — Progress Notes (Signed)
 Pt insulin pump was resulting 51, our machine was resulting 44. Consulted with Jesusita Oka, PA and he came down to visit the patient. Suspended the insulin pump for 30 min's. Gave 8 oz of apple juice, Dan suggested for pt to eat her breakfast and recheck after breakfast. Pt sitting on side of the bed, eating breakfast, bed alarm on, call bell in reach.  Feliberto Gottron, LPN

## 2023-11-09 NOTE — Progress Notes (Signed)
 Occupational Therapy Weekly Progress Note  Patient Details  Name: [name] MRN: [id number] Date of Birth: 07/21/1951  Beginning of progress report period: November 03, 2023 End of progress report period: November 09, 2023   Patient has met 2 of 3 short term goals. Pt is making gradual progress towards LTGs. In the past week, pt has been limited in her intensity with therapy due to medical instability including orthostatic hypotension, dizziness/nausea, and difficulty managing her CBG. However pt has remained effortful and motivated to participate. Initiated family ed with pt's sisters this date, with discussion on extending pt's LOS due to inability to progress to mod I as pt will need to be fairly independent at DC.  She is able to bathe at an overall CGA level, dress UB with mod A, LB with min A, and requires CGA for toileting tasks. Pt continues to demonstrate R hemiparesis, dynamic standing balance and functional endurance deficits, along with RUE FM coordination and AROM deficits resulting in difficulty completing BADL tasks without increased physical assist. Pt will benefit from continued skilled OT services to focus on mentioned deficits.   Patient continues to demonstrate the following deficits: muscle weakness, decreased cardiorespiratoy endurance, decreased coordination and decreased motor planning, and decreased standing balance, hemiplegia, and decreased balance strategies and therefore will continue to benefit from skilled OT intervention to enhance overall performance with BADL, iADL, and Reduce care partner burden.  Patient progressing toward long term goals..  Continue plan of care.  OT Short Term Goals Week 1:  OT Short Term Goal 1 (Week 1): Pt will complete sit > stand with CGA OT Short Term Goal 1 - Progress (Week 1): Met OT Short Term Goal 2 (Week 1): Pt will complete toilet transfer with CGA using LRAD OT Short Term Goal 2 - Progress (Week 1): Met OT Short Term Goal 3 (Week  1): Pt will donn LB clothing supervision using AE PRN OT Short Term Goal 3 - Progress (Week 1): Progressing toward goal Week 2:  OT Short Term Goal 1 (Week 2): Pt will donn UB clothing with supervision OT Short Term Goal 2 (Week 2): Pt will improve RUE AROM in pain free zone to 100 degrees shoulder flexion to assist with UB dressing OT Short Term Goal 3 (Week 2): Pt will complete toilet transfer using LRAD with supervision   Melvyn Novas, MS, OTR/L  11/09/2023, 12:20 PM

## 2023-11-09 NOTE — Progress Notes (Signed)
 Occupational Therapy Session Note  Patient Details  Name: [name] MRN: [id number] Date of Birth: 1951/03/01  Today's Date: 11/09/2023 OT Individual Time: 1005-1100 & 1420-1530 OT Individual Time Calculation (min): 55 min & 70 min   Short Term Goals: Week 1:  OT Short Term Goal 1 (Week 1): Pt will complete sit > stand with CGA OT Short Term Goal 2 (Week 1): Pt will complete toilet transfer with CGA using LRAD OT Short Term Goal 3 (Week 1): Pt will donn LB clothing supervision using AE PRN  Skilled Therapeutic Interventions/Progress Updates:  Session 1 Skilled OT intervention completed with focus on family education with pt's sisters present. Pt received seated in w/c, agreeable to session. No pain reported.  Pt reviewed early morning events of low blood sugar. Pt expressed difficulty with getting night time snack to manage CBG; MD aware.   Pt remained limited this session due to onset of nausea and dizziness. Offered diet ginger ale, rest and donning of knee high TEDs for BP support. See vitals below.  OT provided education on the following in prep for DC: -Recommended RW for all ambulatory mobility with anticipation pt will be mod I at DC for mobility and ADLs -Discussed pt's medical instability/challenges in the past week that has limited the intensity of therapy but pt has been effortful in participating. Discussed that an extension for LOS may be appropriate as pt currently requires CGA/min A for mobility and BADLs -Discussed BP management/assessing prior to functional tasks, with review on use of TEDs, abdominal binders and/or hydration to intervene with orthostatic hypotension or dizziness -Confirmed bathroom set up, with sister reporting tub/shower. Advised that pt's already owned TTB would be safest considering CLOF, environmental set up and pt's balance/endurance deficits  OT assisted pt in demonstrating the following during session: -CGA sit > stand using RW and short  ambulatory transfer from w/c <> EOB  Nurse notified that abdominal binder in room is too small for wearing; with request for new one.  Pt remained seated in w/c, with sisters present and with all needs in reach at end of session.  Vitals BP 109/51 (sitting in w/c at rest); symptomatic  BP 130/59 (after donning of TEDs and transfer to EOB); symptomatic BP 122/52 (after transfer back to w/c); symptomatic   Session 2 Skilled OT intervention completed with focus on activity tolerance, dynamic standing tolerance, ambulatory transfers. Pt received upright in bed, agreeable to session. No pain reported, with improved nausea/dizziness symptoms. Did not assess BP as pt with only minimum and quickly resolving when seated symptoms during session.  Transitioned to EOB with min A for RLE. CGA sit > stand using RW and ambulatory transfer using RW > w/c about 10 ft. Transported dependently in w/c > gym.  Pt participated in the following activities in standing to address standing tolerance/endurance and dynamic balance without AD, needed for independence and safety with BADL management: -Motor speed dot test- L (unaffected) hand- 2.44 sec, R (affected) hand- 4.27 sec, mild difficulty on RUE; results indicative of slower motor processing speed on R side -Bell cancellation test- 3:17 min; 0 misses  Pt completed the following intervals while seated at the SCIFIT to promote global endurance and RUE NMR needed for independence with BADLs and functional transfers: -3 min, level 2, forwards -3 min, level 2, forwards Intermittent rest breaks needed for fatigue  In hallway pt stood with CGA using RW, then ambulated 25 ft using RW with CGA > EOB. Doffed TEDs dependently. Min A  for sit > supine for RLE management. Pt remained upright in bed, with bed alarm on/activated, and with all needs in reach at end of session.   Therapy Documentation Precautions:  Precautions Precautions: Fall Precaution/Restrictions  Comments: R hemiparesis Restrictions Weight Bearing Restrictions Per Provider Order: No    Therapy/Group: Individual Therapy  Melvyn Novas, MS, OTR/L  11/09/2023, 3:43 PM

## 2023-11-10 DIAGNOSIS — I6350 Cerebral infarction due to unspecified occlusion or stenosis of unspecified cerebral artery: Secondary | ICD-10-CM | POA: Diagnosis not present

## 2023-11-10 LAB — BASIC METABOLIC PANEL
Anion gap: 7 (ref 5–15)
BUN: 17 mg/dL (ref 8–23)
CO2: 23 mmol/L (ref 22–32)
Calcium: 8.5 mg/dL — ABNORMAL LOW (ref 8.9–10.3)
Chloride: 110 mmol/L (ref 98–111)
Creatinine, Ser: 0.92 mg/dL (ref 0.44–1.00)
GFR, Estimated: >60 mL/min (ref >60)
Glucose, Bld: 84 mg/dL (ref 70–99)
Potassium: 3.4 mmol/L — ABNORMAL LOW (ref 3.5–5.1)
Sodium: 140 mmol/L (ref 135–145)

## 2023-11-10 LAB — GLUCOSE, CAPILLARY
Glucose-Capillary: 90 mg/dL (ref 70–99)
Glucose-Capillary: 205 mg/dL — ABNORMAL HIGH (ref 70–99)

## 2023-11-10 NOTE — Progress Notes (Signed)
 PROGRESS NOTE   Subjective/Complaints: CBG 46 this morning, had HS snack, discussed that she did not give herself insulin bolus over night, advised turing off insulin pump 1-2 hours between dinner and bedtime  ROS: +R side weakness and numbness , -nausea, -emesis, no joint pain , +dizziness, +ocular myasthenia gravis, +hypoglycemia at night  Objective:   CT HEAD WO CONTRAST ( ) Result Date: 11/08/2023 CLINICAL DATA:  Facial paralysis, left facial droop. EXAM: CT HEAD WITHOUT CONTRAST TECHNIQUE: Contiguous axial images were obtained from the base of the skull through the vertex without intravenous contrast. RADIATION DOSE REDUCTION: This exam was performed according to the departmental dose-optimization program which includes automated exposure control, adjustment of the mA and/or kV according to patient size and/or use of iterative reconstruction technique. 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 No evidence of acute infarct, hemorrhage, hydrocephalus, mass, or collection. Vascular: No hyperdense vessel or unexpected calcification. Skull: Normal. Negative for fracture or focal lesion. Sinuses/Orbits: No acute finding. IMPRESSION: Known lacunar infarcts in the brainstem and thalami. No acute finding. Electronically Signed   By: Tiburcio Pea M.D.   On: 11/08/2023 09:27    Recent Labs    11/08/23 0425 11/09/23 0351  WBC 5.4 6.1  HGB 8.6* 9.1*  HCT 26.9* 28.1*  PLT 205 228   Recent Labs    11/09/23 0351 11/10/23 0337  NA 139 140  K 3.3* 3.4*  CL 111 110  CO2 21* 23  GLUCOSE 132* 84  BUN 15 17  CREATININE 0.91 0.92  CALCIUM 8.4* 8.5*    Intake/Output Summary (Last 24 hours) at 11/10/2023 1610 Last data filed at 11/10/2023 0732 Gross per 24 hour  Intake 180 ml  Output --  Net 180 ml         Physical Exam: Vital Signs Blood pressure (!) (P) 120/53,  pulse (P) 77, temperature 98.4 F (36.9 C), temperature source Oral, resp. rate 16, height 5\' 3"  (1.6 m), weight 86.2 kg, SpO2 (P) 98%.  General: No acute distress Mood and affect are appropriate Heart: Regular rate and rhythm no rubs murmurs or extra sounds Lungs: Clear to auscultation, breathing unlabored, no rales or wheezes Abdomen: Positive bowel sounds, soft nontender to palpation, nondistended Extremities: No clubbing, cyanosis, or edema Skin: No evidence of breakdown, no evidence of rash      Musculoskeletal:        General: Normal range of motion.     Cervical back: Neck supple. No tenderness.     Comments:  RUE- 3-/5  LUE- 5/5  RLE- 3-/5  LLE-5/5, stable 3/19  Neurological:     Mental Status: She is alert and oriented to person, place, and time.  Reduced sensation LT on right side including face  Bilateral mild ptosis  Psychiatric:        Mood and Affect: Mood normal.        Behavior: Behavior normal.   Assessment/Plan: 1. Functional deficits which require 3+ hours per day of interdisciplinary therapy in a comprehensive inpatient rehab setting. Physiatrist is providing close team supervision and 24 hour management of active medical problems listed below. Physiatrist and rehab  team continue to assess barriers to discharge/monitor patient progress toward functional and medical goals  Care Tool:  Bathing    Body parts bathed by patient: Right arm, Left arm, Chest, Abdomen, Right upper leg, Left upper leg, Face, Right lower leg, Left lower leg   Body parts bathed by helper: Front perineal area, Buttocks     Bathing assist Assist Level: Moderate Assistance - Patient 50 - 74%     Upper Body Dressing/Undressing Upper body dressing   What is the patient wearing?: Pull over shirt, Bra    Upper body assist Assist Level: Moderate Assistance - Patient 50 - 74%    Lower Body Dressing/Undressing Lower body dressing      What is the patient wearing?: Pants      Lower body assist Assist for lower body dressing: Minimal Assistance - Patient > 75%     Toileting Toileting    Toileting assist Assist for toileting: Contact Guard/Touching assist     Transfers Chair/bed transfer  Transfers assist  Chair/bed transfer activity did not occur: Safety/medical concerns (nausea, lethargy)  Chair/bed transfer assist level: Contact Guard/Touching assist     Locomotion Ambulation   Ambulation assist   Ambulation activity did not occur: Safety/medical concerns (nausea, lethargy)  Assist level: Contact Guard/Touching assist Assistive device: Walker-rolling Max distance: 137ft   Walk 10 feet activity   Assist  Walk 10 feet activity did not occur: Safety/medical concerns (nausea, lethargy)  Assist level: Contact Guard/Touching assist Assistive device: Walker-rolling   Walk 50 feet activity   Assist Walk 50 feet with 2 turns activity did not occur: Safety/medical concerns (nausea, lethargy)  Assist level: Contact Guard/Touching assist Assistive device: Walker-rolling    Walk 150 feet activity   Assist Walk 150 feet activity did not occur: Safety/medical concerns (nausea, lethargy)         Walk 10 feet on uneven surface  activity   Assist Walk 10 feet on uneven surfaces activity did not occur: Safety/medical concerns (nausea, lethargy)         Wheelchair     Assist Is the patient using a wheelchair?: Yes Type of Wheelchair: Manual Wheelchair activity did not occur: Safety/medical concerns (nausea, lethargy)         Wheelchair 50 feet with 2 turns activity    Assist    Wheelchair 50 feet with 2 turns activity did not occur: Safety/medical concerns (nausea, lethargy)       Wheelchair 150 feet activity     Assist  Wheelchair 150 feet activity did not occur: Safety/medical concerns (nausea, lethargy)       Blood pressure (!) (P) 120/53, pulse (P) 77, temperature 98.4 F (36.9 C), temperature  source Oral, resp. rate 16, height 5\' 3"  (1.6 m), weight 86.2 kg, SpO2 (P) 98%. Medical Problem List and Plan: 1. 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  Antithrombotics: -DVT/anticoagulation:  Pharmaceutical: Lovenox             -antiplatelet therapy: Aspirin and Plavix for three weeks followed by Plavix alone (has ICA stent)   3. Pain Management: Tylenol as needed             -  Lyrica 75 mg BID   4. Mood/Behavior/Sleep: LCSW to evaluate and provide emotional support             -antipsychotic agents: n/a   5. Neuropsych/cognition: This patient is capable of making decisions on her own behalf.   6. Skin/Wound Care: Routine skin care checks   7. Fluids/Electrolytes/Nutrition: Routine Is and Os and follow-up chemistries             -vitamin D weekly             -vitamin B12 daily   8: Hypertension: monitor TID and prn Appreciate IM assist   9: Hyperlipidemia: continue statin, Zetia, Repatha   10: DM: A1c = 9.2%             Continue insulin pump, discussed no more than 7U bolus to prevent hypoglycemia             -continue Jardiance 25 mg daily             -continue SSI  Advised stopping insulin between dinner and bedtime to prevent 3am hypoglycemia CBG (last 3)  Recent Labs    11/09/23 1654 11/09/23 2116 11/10/23 0632  GLUCAP 317* 303* 90   On chronic prednisone as well - IM consulting    11: 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 at home) - likely need to increase to 100 mg QHS_ having  HA's 5-6/10 daily now   14: OSA on CPAP   15: History of breast cancer: continue tamoxifen   16: CKD stage II: Serum creatinine at or near baseline -follow-up BMP   17: Chronic anemia: improving, monitor weekly   18: Leukopenia: resolved, monitor weekly   19. Nausea: thought to be 2/2 constipation but has not resolved with constipation management- patient now having BM, improved later in the day  20. Right upper extremity pain: VAS [place] ordered to assess for clot, discussed that this was negative  21. Emesis: continue IV zofran, medicine consulted for their opinion, thyroid labs were ordered last night and are abnormal, improved later in the day  22. Chest pain/tachycardia: resolved neg V/Q scan, EKG neg  23. Hypokalemia: d/c potassium since patient does not like supplement, improved with eating banana, asked nursing to encourage her to eat another banana today, repeat BMP ordered tomorrow   24.  Mild DOE, no cough or fever, resp panel Neg, airborne prec d/ced  25.  Constipation: messaged nursing regarding when last BM was  26. Worsening left sided facial droop: CT head ordered and is stable  27. Hypotension/Dizziness: d/c avapro  28. Anemia: Hgb reviewed and is improving  29. HTN: discussed removing teds after therapy    LOS: 8 days A FACE TO FACE EVALUATION WAS PERFORMED  Tylique Aull P Timofey Carandang 11/10/2023, 9:07 AM

## 2023-11-10 NOTE — Progress Notes (Signed)
 Occupational Therapy Session Note  Patient Details  Name: [name] MRN: [id number] Date of Birth: March 16, 1951  Today's Date: 11/10/2023 OT Individual Time: 4098-1191 & 1403-1430 OT Individual Time Calculation (min): 72 min & 27 min   Short Term Goals: Week 2:  OT Short Term Goal 1 (Week 2): Pt will donn UB clothing with supervision OT Short Term Goal 2 (Week 2): Pt will improve RUE AROM in pain free zone to 100 degrees shoulder flexion to assist with UB dressing OT Short Term Goal 3 (Week 2): Pt will complete toilet transfer using LRAD with supervision  Skilled Therapeutic Interventions/Progress Updates:  Session 1 Skilled OT intervention completed with focus on ADL retraining, ambulatory endurance. Pt received upright in bed, agreeable to session. Intermittent R shoulder pain reported; nurse present for meds. OT offered rest breaks, repositioning throughout for pain reduction.  Pt politely declined shower this morning, preferring to work on ambulation. Transitioned to EOB with min A for RLE with fatigue onset, as pt using BUE to pick up RLE to advance vs sliding. Doffed shirt without assist. Discussed option of front clasp bra for independence, however pt able to put on back clasping bra like jacket, then min A to fasten clasp. Donned shirt with good recall of hemi technique with supervision.  Pt verbalized dizziness sitting EOB; see vitals below. Donned knee TEDs with mod A with pt assisting for LLE. Discussed moving into 1/2 circle sit position to access BLE for dizziness management and RUE weakness. Donned pants with supervision with + time, at the sit > stand level using RW. CGA sit > stand and stand pivot with RW > w/c.  In hallway, pt stood with CGA using RW, then ambulated with CGA 50 ft x3 with intermittent seated rest break for BP check. Cues needed for RLE clearance and heel strike as pt with mild R toe drag. MD present for rounds; notified of status for BP. Transported pt back  to room for toileting need. CGA sit > stand and ambulatory transfer with RW > toilet. CGA for all toileting steps for continent urinary void and BM; charted.  Ambulated to sink for CGA hand hygiene in stance then ambulation back to w/c.  Pt remained seated in w/c, with chair alarm on/activated, and with all needs in reach at end of session.  Vitals BP 155/59 (sitting EOB without TEDs) BP 185/74 (sitting EOB with TEDs after dressing) BP 174/63 (after transfer to w/c) BP 168/64 (after ambulating 50 ft)  Session 2 Skilled OT intervention completed with focus on [facility] assessment, RUE NMR, cardiovascular endurance. Pt received seated in w/c, agreeable to session. Headache reported; pre-medicated. OT offered rest breaks, repositioning throughout for pain reduction.  Pt declined self-care needs. Transported dependently in w/c <> gym. Glucose monitor did alert pt of 156 CBG; issued pt water per request as she independently manages her insulin administration.  Seated at table top, pt completed 9 hole peg test assessment with the following results: 9HPT ([facility]) Rt hand- 36.78 sec Lt hand- 29.50 sec  Pt completed the following intervals while seated at the SCIFIT to promote global/BUE endurance needed for independence with BADLs and functional transfers: -3 min, level 3, forwards -2 min, level 3, forwards Intermittent rest breaks needed for fatigue  CGA sit > stand and ambulatory transfer using RW > EOB. Doffed shirt mod A and pants mod A and redressed each with mod A for time due to OT time constraint. Min A for sit > supine transition. Pt remained  upright in bed, with bed alarm on/activated, and with all needs in reach at end of session.   Therapy Documentation Precautions:  Precautions Precautions: Fall Precaution/Restrictions Comments: R hemiparesis Restrictions Weight Bearing Restrictions Per Provider Order: No    Therapy/Group: Individual Therapy  Melvyn Novas, MS, OTR/L  11/10/2023,  2:35 PM

## 2023-11-10 NOTE — Progress Notes (Signed)
 Hypoglycemic Event  CBG: 46  Treatment: 8 oz juice/soda  Symptoms: None  Follow-up CBG: Time:05:40 CBG Result:113  Possible Reasons for Event: Unknown  Comments/MD notified:Dan, PA notified. Following protocols from yesterday morning after consulting with him.     [employer]

## 2023-11-10 NOTE — Progress Notes (Signed)
 [name] Abbott, MD dicussed in secure chat with Carman Ching, RN and myself for Pt to give herself insulin as normal for evening time meals. But Pt give herself no more than 4Units for Bedtime Cbg d/t hypoglycemic episode.

## 2023-11-10 NOTE — Inpatient Diabetes Management (Signed)
 Inpatient Diabetes Program Recommendations  AACE/ADA: New Consensus Statement on Inpatient Glycemic Control (2015)  Target Ranges:  Prepandial:   less than 140 mg/dL      Peak postprandial:   less than 180 mg/dL (1-2 hours)      Critically ill patients:  140 - 180 mg/dL   Lab Results  Component Value Date   GLUCAP 205 (H) 11/10/2023   HGBA1C 9.2 (H) 10/29/2023   Diabetes history: Type 2 DM  Outpatient Diabetes medications:  Tresiba 15 units daily , Jardiance 25 mg qd, Bydurian 2 mg qweek, omnipod insulin pump (using Humalog insulin)- last visible settings for insulin pump were as follows from 10/23/21:   Basal insulin  0000-0400       1.0 units/hour  0401-1300       1.4 units/hour  1301-1800       1.65 units/hour  1801-0000       1.45 units/hour  Total daily basal insulin: 33.55 units/24 hours  Current orders for Inpatient glycemic control: insulin pump, Jardiance 25 mg QD  Prednisone 7.5 mg every day    Inpatient Diabetes Program Recommendations:     Noted hypoglycemia this AM of 46 mg/dL.   Recommend verifying any CGM reading less than 70 mg/dL with a fingerstick to assure accuracy.  Do not recommend turning off insulin pump between dinner and bedtime snack- However recommend reducing insulin dose at bedtime (less insulin with bedtime snack ) to prevent lows. Called patient by phone to discuss.   Thanks,  Lorenza Cambridge, RN, BC-ADM Inpatient Diabetes Coordinator Pager [phone]  (8a-5p)

## 2023-11-10 NOTE — Progress Notes (Signed)
 Physical Therapy Session Note  Patient Details  Name: [name] MRN: [id number] Date of Birth: May 09, 1951  Today's Date: 11/10/2023 PT Individual Time: 4098-1191 and 1300-1340 PT Individual Time Calculation (min): 54 min and 40 min  Short Term Goals: Week 1:  PT Short Term Goal 1 (Week 1): pt will perform bed mobility with CGA overall PT Short Term Goal 2 (Week 1): pt will perform transfers with LRAD and CGA PT Short Term Goal 3 (Week 1): pt will ambulate 15ft with LRAD and CGA  Skilled Therapeutic Interventions/Progress Updates:   Treatment Session 1 Received pt sitting in WC, pt agreeable to PT treatment, and reported headache and discomfort in RUE (requested to wait until after therapy for pain medication). Session with emphasis on functional mobility/transfers, BP management, generalized strengthening and endurance, dynamic standing balance/coordination, stair navigation, and gait training. Pt sat in WC at sink and washed face and brushed teeth with set up assist. Pt performed all transfers with RW and CGA/close supervision throughout session. Pt transported to/from room in [facility] dependently for time management and energy conservation purposes.   Pt ambulated 6ft x 2 trials with RW and CGA fading to close supervision - limited by fatigue and required increased effort to clear R foot. Stood at staircase with CGA and navigated 2 6in steps with bilateral handrails and mod A ascending and descending with a step to pattern with cues for "up with the good, down with the bad" technique. Pt unable to complete 4 steps due to weakness and demonstrating difficulty supporting weight on strong leg (L). Also noted poor eccentric control when stepping down, almost as if LLE was giving out. Reinforced recommendation to have sister purchase temporary ramp and pt in agreement.   Returned to room and pt reported urge to void - ambulated into bathroom with RW and CGA/close supervision and pt able to manage  clothing without assist. Pt left in care of NT due to time restrictions.   Vitals: Seated in WC at rest with teds on - BP: 156/67 - pt asymptomatic Standing with teds on - BP: 137/61 - pt asymptomatic Sitting after ambulating trial 1 - BP: 176/72; reassessed and BP: 182/76. Removed teds and reassessed BP: 179/73 - pt asymptomatic Standing - BP:143/63 - pt asymptomatic Standing after ambulating trial 2 - BP: 153/66 - pt asymptomatic  Treatment Session 2 Received pt ambulating out of bathroom with NT - PT took over with care. Pt agreeable to PT treatment and reported having mild headache (premedicated). Session with emphasis on functional mobility/transfers, generalized strengthening and endurance, dynamic standing balance/coordination, and gait training. Pt performed all transfers with RW and CGA throughout session. Pt transported to/from room in [facility] dependently for time management and energy conservation purposes. Pt ambulated 111ft with RW and close supervision with increased time due to R foot drag. Pt performed TUG with RW and close supervision with average of 57.3 seconds. Pt educated on test results and significance indicating high fall risk and importance of using AD upon discharge. Trial 1: 60 seconds Trial 2: 64 seconds  Trial 3: 49 seconds  Concluded session with pt sitting in WC with all needs within reach awaiting upcoming OT session.   Vitals: Sitting in WC after ambulating from bathroom - BP:134/57 - pt asymptomatic Sitting in WC after ambulating - BP: 155/65 - pt reporting mild lightheadedness when turning to sit  Therapy Documentation Precautions:  Precautions Precautions: Fall Precaution/Restrictions Comments: R hemiparesis Restrictions Weight Bearing Restrictions Per Provider Order: No  Therapy/Group:  Individual Therapy Marlana Salvage Zaunegger Blima Rich PT, DPT 11/10/2023, 6:54 AM

## 2023-11-10 NOTE — Patient Care Conference (Signed)
 Inpatient RehabilitationTeam Conference and Plan of Care Update Date: 11/10/2023   Time: 11:38 AM    Patient Name: [name]      Medical Record Number: [id number]  Date of Birth: 1950-12-02 Sex: Female         Room/Bed: 4M07C/4M07C-01 Payor Info: Payor: [employer]vertising copywriter MEDICARE / Plan: [facility] MEDICARE / Product Type: *No Product type* /    Admit Date/Time:  11/02/2023  2:52 PM  Primary Diagnosis:  Right pontine stroke [facility])  Hospital Problems: Principal Problem:   Right pontine stroke (HCC) Active Problems:   Myasthenia gravis (HCC)   Uncontrolled type 2 diabetes mellitus with hyperglycemia (HCC)   Nausea and vomiting   Leukocytosis   SIRS (systemic inflammatory response syndrome) (HCC)    Expected Discharge Date: Expected Discharge Date: 11/18/23  Team Members Present: Physician leading conference: Dr. Sula Soda Social Worker Present: Cecile Sheerer, LCSWA Nurse Present: Chana Bode, RN PT Present: Blima Rich, PT OT Present: Candee Furbish, OT PPS Coordinator present : Fae Pippin, SLP     Current Status/Progress Goal Weekly Team Focus  Bowel/Bladder   Pt continent of b/b   Remain continent   Assist with toileting qshift and prn    Swallow/Nutrition/ Hydration               ADL's   Min A UB, CGA LB, CGA toileting   Mod I   Barriers- limited by intermittent dizziness/nausea, new orthostasis, R hemiparesis, endurance, dynamic standing balance    Mobility   bed mobility min A, transfers with RW CGA/min A, gait 154ft with RW CGA   Mod I  barriers: nausea, lightheadedness, BP, 2 STE with no rails, lives alone    Communication                Safety/Cognition/ Behavioral Observations               Pain   pt c/o frontal headache, prn meds give   <3 pain score   Assess qshift and prn    Skin   Skin intact, Generalized bruising on abdomen   Maintain skin integrity  Assess qshift and prn      Discharge Planning:  Pt will d/c  to home with support from her sister [name] who lives accross the street, and other siblings as well to assist. SW will confirm there are no barriers to discharge.   Team Discussion: Patient post right pontine CVA with vertigo: nausea, dizziness, and HTN. MD adjusting BP medications and hypoglycemia off insulin pump HS bolus; added HS snack and adjusted dinner coverage.    Patient on target to meet rehab goals: yes, currently needs CGA for lower body care and toileting. Needs min assist for transfers. Able to ambulate with up to 124' with CGA using a RW.  Needs mod assist for managing 2 steps.  Goals for discharge set for mod I overall.  *See Care Plan and progress notes for long and short-term goals.   Revisions to Treatment Plan:  CT scan   Teaching Needs: Safety, medications, transfers, toileting, etc.   Current Barriers to Discharge: Decreased caregiver support and Home enviroment access/layout  Possible Resolutions to Barriers: Family education Temporary ramp for entry to home OP follow up services     Medical Summary Current Status: hypoglycemia to 40s overnight, uncontrolled type 2 diabetes, hypertension/hypotension, hypokalemia, obesity  Barriers to Discharge: Medical stability  Barriers to Discharge Comments: hypoglycemia to 40s overnight, uncontrolled type 2 diabetes, hypertension/hypotension, hypokalemia, obesity Possible Resolutions  to Barriers/Weekly Focus: advised turning of basal insulin for 1-2 hours after dinner, HS snack ordered, coninue insulin pump during the day, continue daily banana, d/c avapro   Continued Need for Acute Rehabilitation Level of Care: The patient requires daily medical management by a physician with specialized training in physical medicine and rehabilitation for the following reasons: Direction of a multidisciplinary physical rehabilitation program to maximize functional independence : Yes Medical management of patient stability for increased  activity during participation in an intensive rehabilitation regime.: Yes Analysis of laboratory values and/or radiology reports with any subsequent need for medication adjustment and/or medical intervention. : Yes   I attest that I was present, lead the team conference, and concur with the assessment and plan of the team.   Chana Bode B 11/10/2023, 3:18 PM

## 2023-11-10 NOTE — Progress Notes (Addendum)
 Patient ID: [name], female   DOB: 1951-02-20, 73 y.o.   MRN: [id number]  1516- SW left message for pt sister [name] to inform on change in pt d/c date from 3/24 to 3/37,and discuss family edu again since unable to have family edu on initial day. SW waiting on follow-up.  *SW received return phone call from pt sister to provide updates. She will speak with her sister [name] to discuss family edu again. SW will wait on follow-up.   Cecile Sheerer, MSW, LCSW Office: [phone] Cell: [phone] Fax: [phone]

## 2023-11-11 DIAGNOSIS — I6350 Cerebral infarction due to unspecified occlusion or stenosis of unspecified cerebral artery: Secondary | ICD-10-CM | POA: Diagnosis not present

## 2023-11-11 LAB — BASIC METABOLIC PANEL
Anion gap: 9 (ref 5–15)
BUN: 18 mg/dL (ref 8–23)
CO2: 20 mmol/L — ABNORMAL LOW (ref 22–32)
Calcium: 8.4 mg/dL — ABNORMAL LOW (ref 8.9–10.3)
Chloride: 108 mmol/L (ref 98–111)
Creatinine, Ser: 1.02 mg/dL — ABNORMAL HIGH (ref 0.44–1.00)
GFR, Estimated: 59 mL/min — ABNORMAL LOW (ref >60)
Glucose, Bld: 199 mg/dL — ABNORMAL HIGH (ref 70–99)
Potassium: 3.3 mmol/L — ABNORMAL LOW (ref 3.5–5.1)
Sodium: 137 mmol/L (ref 135–145)

## 2023-11-11 MED ORDER — ENOXAPARIN SODIUM 30 MG/0.3ML IJ SOSY
30.0000 mg | PREFILLED_SYRINGE | INTRAMUSCULAR | Status: DC
  Administered 2023-11-11: 30 mg via SUBCUTANEOUS
  Filled 2023-11-11: qty 0.3

## 2023-11-11 MED ORDER — VITAMIN D 25 MCG (1000 UNIT) PO TABS
1000.0000 [IU] | ORAL_TABLET | Freq: Every day | ORAL | Status: DC
  Administered 2023-11-11 – 2023-11-18 (×8): 1000 [IU] via ORAL
  Filled 2023-11-11 (×8): qty 1

## 2023-11-11 NOTE — Progress Notes (Signed)
 Occupational Therapy Session Note  Patient Details  Name: [name] MRN: [id number] Date of Birth: 02-26-1951  Today's Date: 11/11/2023 OT Individual Time: 1415-1445 OT Individual Time Calculation (min): 30 min    Short Term Goals: Week 1:  OT Short Term Goal 1 (Week 1): Pt will complete sit > stand with CGA OT Short Term Goal 1 - Progress (Week 1): Met OT Short Term Goal 2 (Week 1): Pt will complete toilet transfer with CGA using LRAD OT Short Term Goal 2 - Progress (Week 1): Met OT Short Term Goal 3 (Week 1): Pt will donn LB clothing supervision using AE PRN OT Short Term Goal 3 - Progress (Week 1): Progressing toward goal Week 2:  OT Short Term Goal 1 (Week 2): Pt will donn UB clothing with supervision OT Short Term Goal 2 (Week 2): Pt will improve RUE AROM in pain free zone to 100 degrees shoulder flexion to assist with UB dressing OT Short Term Goal 3 (Week 2): Pt will complete toilet transfer using LRAD with supervision  Skilled Therapeutic Interventions/Progress Updates:      Therapy Documentation Precautions:  Precautions Precautions: Fall Precaution/Restrictions Comments: R hemiparesis Restrictions Weight Bearing Restrictions Per Provider Order: No General: "Nice meeting you!" Pt supine in bed upon OT arrival, agreeable to OT session.  Pain: no pain reported  ADL: OT providing therapeutic use of self in order to build rapport and discuss patient current situation and goals for therapy. OT providing skilled intervention for ADL retraining and bed mobility strategies. Pt completed bed mobility at Min A in order to manage LE in supine><EOB with HOB raised. Pt completed UB and LB dressing at SBA overall for donning/doffing. OT reinforcing heni dressing techniques with good carryover from pt and increased independence. Pt standing at RW in order to manage over waist with SBA.    Pt supine in bed with bed alarm activated, 2 bed rails up, call light within reach and 4Ps  assessed.   Therapy/Group: Individual Therapy  Velia Meyer, OTD, OTR/L 11/11/2023, 3:48 PM

## 2023-11-11 NOTE — Progress Notes (Signed)
 PROGRESS NOTE   Subjective/Complaints: No more hypoglycemia last night or this morning, patient is going to try to continue with no more than 4U insulin bolus, she received no water with HS snack  ROS: +R side weakness and numbness , -nausea, -emesis, no joint pain , +dizziness, +ocular myasthenia gravis, +hypoglycemia at night, +diabetic peripheral neuropathy  Objective:   No results found.   Recent Labs    11/09/23 0351  WBC 6.1  HGB 9.1*  HCT 28.1*  PLT 228   Recent Labs    11/10/23 0337 11/11/23 0321  NA 140 137  K 3.4* 3.3*  CL 110 108  CO2 23 20*  GLUCOSE 84 199*  BUN 17 18  CREATININE 0.92 1.02*  CALCIUM 8.5* 8.4*    Intake/Output Summary (Last 24 hours) at 11/11/2023 0943 Last data filed at 11/11/2023 0727 Gross per 24 hour  Intake 550 ml  Output --  Net 550 ml         Physical Exam: Vital Signs Blood pressure (!) 143/59, pulse 80, temperature 98 F (36.7 C), resp. rate 17, height 5\' 3"  (1.6 m), weight 86.2 kg, SpO2 99%.  General: No acute distress Mood and affect are appropriate Heart: Regular rate and rhythm no rubs murmurs or extra sounds Lungs: Clear to auscultation, breathing unlabored, no rales or wheezes Abdomen: Positive bowel sounds, soft nontender to palpation, nondistended Extremities: No clubbing, cyanosis, or edema Skin: No evidence of breakdown, no evidence of rash      Musculoskeletal:        General: Normal range of motion.     Cervical back: Neck supple. No tenderness.     Comments:  RUE- 3-/5  LUE- 5/5  RLE- 3-/5  LLE-5/5, stable 3/20  Neurological:     Mental Status: She is alert and oriented to person, place, and time.  Reduced sensation LT on right side including face  Bilateral mild ptosis  Psychiatric:        Mood and Affect: Mood normal.        Behavior: Behavior normal.   Assessment/Plan: 1. Functional deficits which require 3+ hours per day of  interdisciplinary therapy in a comprehensive inpatient rehab setting. Physiatrist is providing close team supervision and 24 hour management of active medical problems listed below. Physiatrist and rehab team continue to assess barriers to discharge/monitor patient progress toward functional and medical goals  Care Tool:  Bathing    Body parts bathed by patient: Right arm, Left arm, Chest, Abdomen, Right upper leg, Left upper leg, Face, Right lower leg, Left lower leg   Body parts bathed by helper: Front perineal area, Buttocks     Bathing assist Assist Level: Moderate Assistance - Patient 50 - 74%     Upper Body Dressing/Undressing Upper body dressing   What is the patient wearing?: Pull over shirt, Bra    Upper body assist Assist Level: Moderate Assistance - Patient 50 - 74%    Lower Body Dressing/Undressing Lower body dressing      What is the patient wearing?: Pants     Lower body assist Assist for lower body dressing: Minimal Assistance - Patient > 75%     Toileting  Toileting    Toileting assist Assist for toileting: Contact Guard/Touching assist     Transfers Chair/bed transfer  Transfers assist  Chair/bed transfer activity did not occur: Safety/medical concerns (nausea, lethargy)  Chair/bed transfer assist level: Supervision/Verbal cueing     Locomotion Ambulation   Ambulation assist   Ambulation activity did not occur: Safety/medical concerns (nausea, lethargy)  Assist level: Contact Guard/Touching assist Assistive device: Walker-rolling Max distance: 183ft   Walk 10 feet activity   Assist  Walk 10 feet activity did not occur: Safety/medical concerns (nausea, lethargy)  Assist level: Contact Guard/Touching assist Assistive device: Walker-rolling   Walk 50 feet activity   Assist Walk 50 feet with 2 turns activity did not occur: Safety/medical concerns (nausea, lethargy)  Assist level: Contact Guard/Touching assist Assistive device:  Walker-rolling    Walk 150 feet activity   Assist Walk 150 feet activity did not occur: Safety/medical concerns (nausea, lethargy)  Assist level: Contact Guard/Touching assist Assistive device: Walker-rolling    Walk 10 feet on uneven surface  activity   Assist Walk 10 feet on uneven surfaces activity did not occur: Safety/medical concerns (nausea, lethargy)         Wheelchair     Assist Is the patient using a wheelchair?: Yes Type of Wheelchair: Manual Wheelchair activity did not occur: Safety/medical concerns (nausea, lethargy)         Wheelchair 50 feet with 2 turns activity    Assist    Wheelchair 50 feet with 2 turns activity did not occur: Safety/medical concerns (nausea, lethargy)       Wheelchair 150 feet activity     Assist  Wheelchair 150 feet activity did not occur: Safety/medical concerns (nausea, lethargy)       Blood pressure (!) 143/59, pulse 80, temperature 98 F (36.7 C), resp. rate 17, height 5\' 3"  (1.6 m), weight 86.2 kg, SpO2 99%. Medical Problem List and Plan: 1. Functional deficits secondary to R pontine CVA             -patient may  shower= as long as port not accessed             -ELOS/Goals: 7-10 days min A to supervision           Changed D2 supplement to D3   2.  Impaired mobility: decrease Lovenox to 30mg  daily             -antiplatelet therapy: Aspirin and Plavix for three weeks followed by Plavix alone (has ICA stent)   3. Diabetic peripheral neuropathy: Messaged Sherry to schedule for outpatient Qutenza. Tylenol as needed             -Lyrica 75 mg BID   4. Mood/Behavior/Sleep: LCSW to evaluate and provide emotional support             -antipsychotic agents: n/a   5. Neuropsych/cognition: This patient is capable of making decisions on her own behalf.   6. Skin/Wound Care: Routine skin care checks   7. Fluids/Electrolytes/Nutrition: Routine Is and Os and follow-up chemistries             -vitamin D weekly              -vitamin B12 daily   8: Hypertension: monitor TID and prn Appreciate IM assist   9: Hyperlipidemia: continue statin, Zetia, Repatha   10: DM: A1c = 9.2%             Continue insulin pump, discussed no more than 4U bolus to  prevent hypoglycemia             -continue Jardiance 25 mg daily             -continue SSI  Advised stopping insulin between dinner and bedtime to prevent 3am hypoglycemia CBG (last 3)  Recent Labs    11/09/23 2116 11/10/23 0632 11/10/23 1120  GLUCAP 303* 90 205*   On chronic prednisone as well - IM consulting    11: 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 Nausea: thought to be 2/2 constipation but has not resolved with constipation management- patient now having BM, improved later in the day  20. Right upper extremity pain: VAS [place] ordered to assess for clot, discussed that this was negative  21. Emesis: continue IV zofran, medicine consulted for their opinion, thyroid labs were ordered last night and are abnormal, improved later in the day  22. Chest pain/tachycardia: resolved neg V/Q scan, EKG neg  23. Hypokalemia: d/c potassium since patient does not like supplement, improved with eating banana, asked nursing to encourage her to eat another banana today, repeat BMP ordered tomorrow   24.  Mild DOE, no cough or fever, resp panel Neg, airborne prec d/ced  25.   Constipation: messaged nursing regarding when last BM was  26. Worsening left sided facial droop: CT head ordered and is stable  27. Hypotension/Dizziness: d/c avapro  28. Anemia: Hgb reviewed and is improving  29. HTN: discussed removing teds after therapy    LOS: 9 days A FACE TO FACE EVALUATION WAS PERFORMED  Drema Pry Erinne Gillentine 11/11/2023, 9:43 AM

## 2023-11-11 NOTE — Progress Notes (Addendum)
 Physical Therapy Session Note  Patient Details  Name: [name] MRN: [id number] Date of Birth: Jan 10, 1951  Today's Date: 11/11/2023 PT Individual Time: 2130-8657 and 1320-1355  PT Individual Time Calculation (min): 33 min and 35 min  Short Term Goals: Week 2:  PT Short Term Goal 1 (Week 2): STG=LTG due to LOS  Skilled Therapeutic Interventions/Progress Updates: Pt presented in day room handoff from primary PT and agreeable to therapy. Pt endorses significant fatigue due to already having 2 PT sessions prior to this one. Pt agreeable to participate in NuStep activities for general conditioning. Pt stood with CGA and ambulated ~21ft to NuStep. Explained mechanics of NuStep and set up pt on L3. Pt then participated in x 3 bouts of NuStep using x 4 extremities encouraging pt to maintain <20SPM. Pt provided with ~2 min rest breaks between bouts. On last bout pt used on BLE for increased glute/hamstring activation however only able to complete x 10 cycles. Once completed pt performed stand step transfer to w/c and transported back to room. In room pt completed stand step transfer to bed and performed sit to supine with minA for BLE management. Pt able to reposition self to comfort and left in bed with bed alarm on, call bell within reach and needs met.   Tx2: Pt presented in bed eating lunch, per NT trays received late in unit. Pt requesting time to complete meal with PTA returning in approx 15 min. At that time pt sitting EOB preparing to ambulate to bathroom. Pt handed off to PTA and pt completed transfer to toilet with pt completing toilet transfer with supervision (continent B&B void/charted). Once completed pt ambulated to sink and completed hand hygiene in standing then ambulated to w/c for seated rest. Pt agreable to work on ambulation for increased RLE ms recruitment. Pt ambulated x 3 bouts total First bout ~49ft with CGA and chair follow. Second bout ~57ft with pt self selecting emphasis on hip  flexion. Third bout pt ambulated ~163ft back to room, CGA, poor foot clearance with fatigue. Discussed with pt performing DF with RLE when in seated for increased ms activation. Back in room pt completed sit to supine with light minA for RLE management. Pt left in bed at end of session with call bell within reach and needs met.    Therapy Documentation Precautions:  Precautions Precautions: Fall Precaution/Restrictions Comments: R hemiparesis Restrictions Weight Bearing Restrictions Per Provider Order: No General: PT Amount of Missed Time (min): 10 Minutes PT Missed Treatment Reason: Other (Comment) (meal) Vital Signs: Therapy Vitals Temp: 98.5 F (36.9 C) Temp Source: Oral Pulse Rate: (!) 102 Resp: 16 BP: (!) 155/65 Patient Position (if appropriate): Lying Oxygen Therapy SpO2: 97 % O2 Device: Room Air   Therapy/Group: Individual Therapy  Adaleigh Warf 11/11/2023, 4:18 PM

## 2023-11-11 NOTE — Progress Notes (Addendum)
 Physical Therapy Session Note  Patient Details  Name: [name] MRN: [id number] Date of Birth: March 29, 1951  Today's Date: 11/11/2023 PT Individual Time: [phone] and 9562-1308 PT Individual Time Calculation (min): 69 min and 57 min  Short Term Goals: Week 1:  PT Short Term Goal 1 (Week 1): pt will perform bed mobility with CGA overall PT Short Term Goal 2 (Week 1): pt will perform transfers with LRAD and CGA PT Short Term Goal 3 (Week 1): pt will ambulate 61ft with LRAD and CGA  Skilled Therapeutic Interventions/Progress Updates:   Treatment Session 1 Received pt standing at sink with NT - PT took over with care. Pt agreeable to PT treatment and did not state pain level during session. Session with emphasis on functional mobility/transfers, dressing, BP management, generalized strengthening and endurance, dynamic standing balance/coordination, and gait training.   Sat in WC at sink and performed hand hygiene, brushed teeth, and washed face. Pt requested to dress - doffed dirty shirt with supervision and cues for technique. Donned bra with assist to fasten clip and applied deodorant with set up assist. Donned pull over shirt with supervision and performed all transfers with RW and close supervision throughout session. Stood and removed dirty underwear and pants and washed/dried lower body in standing with close supervision. Donned underwear and pants sitting in WC, then stood to pull over hips with close supervision. Donned shoes with max A and pt transported to/from room in [facility] dependently for time management purposes.   Pt ambulated 48ft x 2 trials with RW and close supervision. Pt reports her doorways at home are narrow and that her RW at home has the wheels flipped on the side. Adjusted RW and placed wheels on inside for 2nd trial ambulating, however pt reported RW felt to "unsteady" and requested therapist switch wheels back to outside. Discussed side stepping through narrow doorways at  home for RW to fit. Returned to room and pt requested to return to bed to rest until next PT session. NT changing linens, then to assist pt back to bed. Pt left sitting in WC left in care of NT.   Vitals (no teds): -Sitting in WC after grooming at sink - BP:  155/70 -Standing after dressing - BP: 147/58 - pt reported mild light dizziness -Sitting after ambulating trial 1- BP:162/72  -Sitting after ambulating trial 2 - BP:144/62  Treatment Session 2  Received pt semi-reclined in bed, pt agreeable to PT treatment, and reported mild headache. Session with emphasis on functional mobility/transfers, BP management, generalized strengthening and endurance, and dynamic standing balance/coordination. Pt transferred semi-reclined<>sitting R EOB with HOB elevated and light min A for RLE management.   Pt performed all transfers with RW and supervision throughout session. Pt transported to/from room in [facility] dependently for time management purposes. Pt performed the following standing exercises with emphasis on LE strength/ROM and RLE NMR: -marches 2x10 bilaterally with 1.5lb ankle weight on LLE -mini squats 2x10 -hip abduction 2x10 bilaterally with 1.5lb ankle weight on LLE -seated RLE heel slides 2x10 Pt required seated rest break in between sets due to 8/10 fatigue and very reported mild dizziness. Pt left seated on table in dayroom - handoff to PTA.  Vitals (no teds): Sitting EOB - BP: 157/70 Standing EOB - BP: 140/61 Standing during exercises - BP: 141/66 on trial 1 and 142/69 on trial 2  Therapy Documentation Precautions:  Precautions Precautions: Fall Precaution/Restrictions Comments: R hemiparesis Restrictions Weight Bearing Restrictions Per Provider Order: No  Therapy/Group: Individual Therapy  Marlana Salvage Zaunegger Blima Rich PT, DPT 11/11/2023, 6:51 AM

## 2023-11-11 NOTE — Progress Notes (Signed)
 Physical Therapy Weekly Progress Note  Patient Details  Name: [name] MRN: [id number] Date of Birth: 1950-10-06  Beginning of progress report period: November 03, 2023 End of progress report period: November 11, 2023  Patient has met 2 of 3 short term goals. Pt demonstrates slow progress towards long term goals. Pt has been limited by intermittent nausea, fatigue, and dizziness/lightheadedness resulting from inconsistent BP. Pt currently requires very light min A for RLE management with bed mobility with HOB slightly elevated, CGA/close supervision for transfers with RW, CGA to ambulate 127ft with RW, and mod A to navigate 2 6in steps with bilateral handrails. Have recommended getting temporary ramp installed and pt in agreement. Pt's family attended family education training, however pt not feeling well that day and plan to reschedule. Pt's discharge date has been extended due to pt missing therapy time for medical reasons.   Patient continues to demonstrate the following deficits muscle weakness, decreased cardiorespiratoy endurance, impaired timing and sequencing, abnormal tone, unbalanced muscle activation, and decreased coordination, and decreased standing balance, decreased postural control, hemiplegia, and decreased balance strategies and therefore will continue to benefit from skilled PT intervention to increase functional independence with mobility.  Patient progressing toward long term goals..  Continue plan of care.  PT Short Term Goals Week 1:  PT Short Term Goal 1 (Week 1): pt will perform bed mobility with CGA overall PT Short Term Goal 1 - Progress (Week 1): Progressing toward goal PT Short Term Goal 2 (Week 1): pt will perform transfers with LRAD and CGA PT Short Term Goal 2 - Progress (Week 1): Met PT Short Term Goal 3 (Week 1): pt will ambulate 69ft with LRAD and CGA PT Short Term Goal 3 - Progress (Week 1): Met Week 2:  PT Short Term Goal 1 (Week 2): STG=LTG due to  LOS  Skilled Therapeutic Interventions/Progress Updates:  Ambulation/gait training;Discharge planning;Functional mobility training;Psychosocial support;Therapeutic Activities;Visual/perceptual remediation/compensation;Balance/vestibular training;Disease management/prevention;Neuromuscular re-education;Skin care/wound management;Therapeutic Exercise;Wheelchair propulsion/positioning;DME/adaptive equipment instruction;Pain management;Splinting/orthotics;UE/LE Strength taining/ROM;Community reintegration;Patient/family education;Stair training;UE/LE Coordination activities   Therapy Documentation Precautions:  Precautions Precautions: Fall Precaution/Restrictions Comments: R hemiparesis Restrictions Weight Bearing Restrictions Per Provider Order: No  Therapy/Group: Individual Therapy Marlana Salvage Zaunegger Blima Rich PT, DPT 11/11/2023, 7:06 AM

## 2023-11-11 NOTE — Progress Notes (Signed)
 Patient ID: [name], female   DOB: [birth year], 73 y.o.   MRN: [id number]  SW met with pt in room to provide updates from team conference, and d/c date moved to 3/27. She is aware SW will try and get a RW if insurance will allow. Outpatient referral for PT/OT will be sent to [facility].   Cecile Sheerer, MSW, LCSW Office: [phone] Cell: [phone] Fax: [phone]

## 2023-11-12 DIAGNOSIS — I6350 Cerebral infarction due to unspecified occlusion or stenosis of unspecified cerebral artery: Secondary | ICD-10-CM | POA: Diagnosis not present

## 2023-11-12 LAB — BASIC METABOLIC PANEL
Anion gap: 8 (ref 5–15)
BUN: 17 mg/dL (ref 8–23)
CO2: 22 mmol/L (ref 22–32)
Calcium: 8.5 mg/dL — ABNORMAL LOW (ref 8.9–10.3)
Chloride: 110 mmol/L (ref 98–111)
Creatinine, Ser: 0.98 mg/dL (ref 0.44–1.00)
GFR, Estimated: >60 mL/min (ref >60)
Glucose, Bld: 173 mg/dL — ABNORMAL HIGH (ref 70–99)
Potassium: 3.3 mmol/L — ABNORMAL LOW (ref 3.5–5.1)
Sodium: 140 mmol/L (ref 135–145)

## 2023-11-12 LAB — GLUCOSE, CAPILLARY: Glucose-Capillary: 118 mg/dL — ABNORMAL HIGH (ref 70–99)

## 2023-11-12 MED ORDER — PANTOPRAZOLE SODIUM 40 MG PO TBEC
40.0000 mg | DELAYED_RELEASE_TABLET | Freq: Two times a day (BID) | ORAL | Status: DC
  Administered 2023-11-12 – 2023-11-18 (×12): 40 mg via ORAL
  Filled 2023-11-12 (×12): qty 1

## 2023-11-12 MED ORDER — DOCUSATE SODIUM 100 MG PO CAPS
100.0000 mg | ORAL_CAPSULE | Freq: Every day | ORAL | Status: DC | PRN
  Administered 2023-11-12 – 2023-11-13 (×2): 100 mg via ORAL
  Filled 2023-11-12 (×2): qty 1

## 2023-11-12 NOTE — Progress Notes (Signed)
 Physical Therapy Session Note  Patient Details  Name: [name] MRN: [id number] Date of Birth: May 02, 1951  Today's Date: 11/12/2023 PT Individual Time: 0930-1040 PT Individual Time Calculation (min): 70 min   Short Term Goals: Week 1:  PT Short Term Goal 1 (Week 1): pt will perform bed mobility with CGA overall PT Short Term Goal 1 - Progress (Week 1): Progressing toward goal PT Short Term Goal 2 (Week 1): pt will perform transfers with LRAD and CGA PT Short Term Goal 2 - Progress (Week 1): Met PT Short Term Goal 3 (Week 1): pt will ambulate 25ft with LRAD and CGA PT Short Term Goal 3 - Progress (Week 1): Met Week 2:  PT Short Term Goal 1 (Week 2): STG=LTG due to LOS  Skilled Therapeutic Interventions/Progress Updates:   Received pt sitting in WC, pt agreeable to PT treatment, and reported headache (baseline). Session with emphasis on functional mobility/transfers, generalized strengthening and endurance, dynamic standing balance/coordination, stair navigation, and gait training. Pt transported to/from room in [facility] dependently for time management purposes.   Pt performed all transfers with RW and supervision throughout session. Pt ambulated 173ft with RW and close supervision - noted improvements in R foot clearance today. Stood at staircase with supervision and performed RLE toe taps to 3in step with BUE support and emphasis on hip flexor strength. Pt then navigated 8 3in steps with bilateral handrails and CGA when ascending and light min A when descending using a step to pattern with cues for "up with the good, down with the bad" technique. Pt required standing rest break at top due to "lightheadedness".   Worked on dynamic standing balance tossing ball against rebounder 2x20 reps with CGA/light min A for balance - emphasis on coordination and reaction time. Ambulated ~39ft backwards with RW and CGA to mat and performed blocked practice sit<>stands on Airex 1x5 with 1 UE support on RW  progrssing to no UE support 1x5 with CGA/light min A for balance. Returned to room and pt requested to lie down - ambulated to bed with RW and supervision and transferred into supine with light min A for RLE management. Concluded session with pt semi-reclined in bed, needs within reach, and bed alarm on.   Vitals: Sitting in WC - BP: 156/77, HR 92bpm, SPO2 99% Sanding after ambulating - BP:165/70 Sitting after stair navigation - BP: 151/69  Therapy Documentation Precautions:  Precautions Precautions: Fall Precaution/Restrictions Comments: R hemiparesis Restrictions Weight Bearing Restrictions Per Provider Order: No  Therapy/Group: Individual Therapy Marlana Salvage Zaunegger Blima Rich PT, DPT 11/12/2023, 7:09 AM

## 2023-11-12 NOTE — Progress Notes (Signed)
 Occupational Therapy Session Note  Patient Details  Name: [name] MRN: [id number] Date of Birth: Jan 24, 1951  Today's Date: 11/12/2023 OT Individual Time: [phone] OT Individual Time Calculation (min): 73 min    Short Term Goals: Week 2:  OT Short Term Goal 1 (Week 2): Pt will donn UB clothing with supervision OT Short Term Goal 2 (Week 2): Pt will improve RUE AROM in pain free zone to 100 degrees shoulder flexion to assist with UB dressing OT Short Term Goal 3 (Week 2): Pt will complete toilet transfer using LRAD with supervision  Skilled Therapeutic Interventions/Progress Updates: Patient received with report of bad nights sleep, but agreeable to OT treatment. Patient seen for ADL's as listed below. Patient able to get from supine to EOB with CGA. Ambulated into the bathroom to bathe with CGA using RW. Patient able to doff all clothing except socks. Patient able to wash with assist only to clean bottom of feet. Discussed dropping wash cloth onto the floor to wash feet at home. Patient able to dry with set up assist. Patient transferred to w/c upon completion of bathing to dress. Patient with good balance during clothing management standing at the walker. Good endurance and tolerance throughout treatment. Patient finished grooming tasks seated in the w/c at the sink. Continue with OT POC to work towards LTG's of improved independence and safety with self care.      Therapy Documentation Precautions:  Precautions Precautions: Fall Precaution/Restrictions Comments: R hemiparesis Restrictions Weight Bearing Restrictions Per Provider Order: No    Vital Signs:bs @ 162   Pain:5/10 mild HA   ADL: ADL Eating: Mod I Where Assessed-Eating: Bed level Grooming: Setup Where Assessed-Grooming: sitting at the sink Upper Body Bathing: Supervision/safety Where Assessed-Upper Body Bathing: set up Lower Body Bathing: contact guard assistance Where Assessed-Lower Body Bathing:  shower Upper Body Dressing: set up assistance Where Assessed-Upper Body Dressing: w/c Lower Body Dressing: contact guard assistance Where Assessed-Lower Body Dressing: Edge of bed Toileting: CGA Where Assessed-Toileting: toilet Toilet Transfer: CGA Toilet Transfer Method: Stand pivot Acupuncturist: Other (comment), Bedside commode (RW) Walk-In Shower Transfer: CGA Walk-In Shower Transfer Method: stand pivot with grab bars    Therapy/Group: Individual Therapy  Warnell Forester 11/12/2023, 9:14 AM

## 2023-11-12 NOTE — Progress Notes (Signed)
 PROGRESS NOTE   Subjective/Complaints: Discussed that CBGs have improved Discussed that she was not seen by the dietician yesterday, HS snack still has not been changed  ROS: +R side weakness and numbness , -nausea, -emesis, no joint pain , +dizziness, +ocular myasthenia gravis, +hypoglycemia at night, +diabetic peripheral neuropathy- stable  Objective:   No results found.   No results for input(s): "WBC", "HGB", "HCT", "PLT" in the last 72 hours.  Recent Labs    11/11/23 0321 11/12/23 0338  NA 137 140  K 3.3* 3.3*  CL 108 110  CO2 20* 22  GLUCOSE 199* 173*  BUN 18 17  CREATININE 1.02* 0.98  CALCIUM 8.4* 8.5*    Intake/Output Summary (Last 24 hours) at 11/12/2023 1038 Last data filed at 11/12/2023 0854 Gross per 24 hour  Intake 960 ml  Output --  Net 960 ml         Physical Exam: Vital Signs Blood pressure 138/63, pulse 81, temperature 98.2 F (36.8 C), resp. rate 18, height 5\' 3"  (1.6 m), weight 86.2 kg, SpO2 90%.  General: No acute distress Mood and affect are appropriate Heart: Regular rate and rhythm no rubs murmurs or extra sounds Lungs: Clear to auscultation, breathing unlabored, no rales or wheezes Abdomen: Positive bowel sounds, soft nontender to palpation, nondistended Extremities: No clubbing, cyanosis, or edema Skin: No evidence of breakdown, no evidence of rash      Musculoskeletal:        General: Normal range of motion.     Cervical back: Neck supple. No tenderness.     Comments:  RUE- 3-/5  LUE- 5/5  RLE- 3-/5  LLE-5/5, stable 3/21  Neurological:     Mental Status: She is alert and oriented to person, place, and time.  Reduced sensation LT on right side including face  Bilateral mild ptosis  Psychiatric:        Mood and Affect: Mood normal.        Behavior: Behavior normal.   Assessment/Plan: 1. Functional deficits which require 3+ hours per day of interdisciplinary therapy  in a comprehensive inpatient rehab setting. Physiatrist is providing close team supervision and 24 hour management of active medical problems listed below. Physiatrist and rehab team continue to assess barriers to discharge/monitor patient progress toward functional and medical goals  Care Tool:  Bathing    Body parts bathed by patient: Right arm, Left arm, Chest, Abdomen, Right upper leg, Left upper leg, Face, Right lower leg, Left lower leg, Front perineal area, Buttocks   Body parts bathed by helper: Front perineal area, Buttocks     Bathing assist Assist Level: Contact Guard/Touching assist     Upper Body Dressing/Undressing Upper body dressing   What is the patient wearing?: Pull over shirt, Bra    Upper body assist Assist Level: Set up assist Assistive Device Comment: able to hook bra and spin around  Lower Body Dressing/Undressing Lower body dressing      What is the patient wearing?: Underwear/pull up, Pants     Lower body assist Assist for lower body dressing: Contact Guard/Touching assist     Toileting Toileting    Toileting assist Assist for toileting: Contact Guard/Touching  assist     Transfers Chair/bed transfer  Transfers assist  Chair/bed transfer activity did not occur: Safety/medical concerns (nausea, lethargy)  Chair/bed transfer assist level: Supervision/Verbal cueing     Locomotion Ambulation   Ambulation assist   Ambulation activity did not occur: Safety/medical concerns (nausea, lethargy)  Assist level: Contact Guard/Touching assist Assistive device: Walker-rolling Max distance: 166ft   Walk 10 feet activity   Assist  Walk 10 feet activity did not occur: Safety/medical concerns (nausea, lethargy)  Assist level: Contact Guard/Touching assist Assistive device: Walker-rolling   Walk 50 feet activity   Assist Walk 50 feet with 2 turns activity did not occur: Safety/medical concerns (nausea, lethargy)  Assist level: Contact  Guard/Touching assist Assistive device: Walker-rolling    Walk 150 feet activity   Assist Walk 150 feet activity did not occur: Safety/medical concerns (nausea, lethargy)  Assist level: Contact Guard/Touching assist Assistive device: Walker-rolling    Walk 10 feet on uneven surface  activity   Assist Walk 10 feet on uneven surfaces activity did not occur: Safety/medical concerns (nausea, lethargy)         Wheelchair     Assist Is the patient using a wheelchair?: Yes Type of Wheelchair: Manual Wheelchair activity did not occur: Safety/medical concerns (nausea, lethargy)         Wheelchair 50 feet with 2 turns activity    Assist    Wheelchair 50 feet with 2 turns activity did not occur: Safety/medical concerns (nausea, lethargy)       Wheelchair 150 feet activity     Assist  Wheelchair 150 feet activity did not occur: Safety/medical concerns (nausea, lethargy)       Blood pressure 138/63, pulse 81, temperature 98.2 F (36.8 C), resp. rate 18, height 5\' 3"  (1.6 m), weight 86.2 kg, SpO2 90%. Medical Problem List and Plan: 1. 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  Impaired mobility: d/c lovenox given abdominal bruising and since patient is ambulating >150 feet, SCDs ordered             -antiplatelet therapy: Aspirin and Plavix for three weeks followed by Plavix alone (has ICA stent)   3. Diabetic peripheral neuropathy: Messaged Sherry to schedule for outpatient Qutenza. Tylenol as needed             -Lyrica 75 mg BID   4. Mood/Behavior/Sleep: LCSW to evaluate and provide emotional support             -antipsychotic agents: n/a   5. Neuropsych/cognition: This patient is capable of making decisions on her own behalf.   6.  Skin/Wound Care: Routine skin care checks   7. Fluids/Electrolytes/Nutrition: Routine Is and Os and follow-up chemistries             -vitamin B12 daily   8:    9: Hyperlipidemia: continue statin, Zetia, Repatha   10: DM: A1c = 9.2%             Continue insulin pump, discussed no more than 4U bolus to prevent hypoglycemia             -continue Jardiance 25  mg daily             -continue SSI  Advised stopping insulin between dinner and bedtime to prevent 3am hypoglycemia  Asked nursing to request that dietician speak with patient to help adjust her HS snack CBG (last 3)  Recent Labs    11/10/23 0632 11/10/23 1120 11/12/23 0538  GLUCAP 90 205* 118*   On chronic prednisone as well - IM consulting    11: 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 Nausea: thought to be 2/2 constipation but has not resolved with constipation management- patient now having BM, improved later in the day  20. Right upper extremity pain: VAS [place] ordered to assess for clot, discussed that this was negative  21. Emesis: continue IV zofran, medicine consulted for their opinion, thyroid labs were ordered last night and are abnormal, improved later in the day  22. Chest pain/tachycardia: resolved neg V/Q scan, EKG neg  23. Hypokalemia: d/c potassium since patient does not like supplement, improved with eating banana, asked nursing  to encourage her to eat another banana today, repeat BMP ordered tomorrow   24.  Mild DOE, no cough or fever, resp panel Neg, airborne prec d/ced  25.  Constipation: messaged nursing regarding when last BM was and was told it was 20th  26. Worsening left sided facial droop: CT head ordered and is stable  27. Hypotension/Dizziness: d/c avapro, resolved  28. Anemia: Hgb reviewed and is improving, monitor weekly  29. HTN: discussed removing teds after therapy, resolved    LOS: 10 days A FACE TO FACE EVALUATION WAS PERFORMED  [name] 11/12/2023, 10:38 AM

## 2023-11-12 NOTE — Progress Notes (Signed)
 Patient ID: [name], female   DOB: [birth year], 73 y.o.   MRN: [id number]  SW received message from pt sister [name] about family edu on Monday.   1127- SW called pt siste [name] to confirm family edu on Monday 1pm-3pm.   SW ordered RW with [employer] via parachute.   Cecile Sheerer, MSW, LCSW Office: [phone] Cell: [phone] Fax: [phone]

## 2023-11-12 NOTE — Progress Notes (Signed)
 Physical Therapy Session Note  Patient Details  Name: [name] MRN: [id number] Date of Birth: 1950/12/03  Today's Date: 11/12/2023 PT Individual Time: 1130-1200 and 1501-1531 PT Individual Time Calculation (min): 30 min and 30 min.  Short Term Goals: Week 2:  PT Short Term Goal 1 (Week 2): STG=LTG due to LOS  Skilled Therapeutic Interventions/Progress Updates:   First session:  Pt presents supine in bed and agreeable to therapy.  Pt transfers slowly from supine w/ min A for LES an then can scoot to EOB.  Pt states fatigue and noted R foot drag w/ amb.  Pt transfers sit to stand w/ supervision .  Pt amb x 150' w/ RW, supervision including turn to return to room.  PT retrieved toe cap for R foot and amb 150' w/ RW, supervision and continued toe drag but improved speed.  Encouraged pt to continue w/ DF to improve gait.  Pt returned to bed w/ min A for LES and then positions self, bed alarm on, siderail up per pt preference and all needs in reach.  Second session:  Pt presents sitting EOB and agreeable to therapy.  Pt transfers sit to stand w/ supervision from arm chairs and bed during session.  Pt occ needs to remind self to scoot to edge of seat.  Pt amb 150' x 2 and 120' w/ RW and supervision, toe cap donned for improved swing through RLE.  Pt returned to bed and requested to sit EOB.  Bed alarm on and all needs in reach.     Therapy Documentation Precautions:  Precautions Precautions: Fall Precaution/Restrictions Comments: R hemiparesis Restrictions Weight Bearing Restrictions Per Provider Order: No General:   Vital Signs:   Pain:6/10 HA       Therapy/Group: Individual Therapy  Lucio Edward 11/12/2023, 12:07 PM

## 2023-11-13 DIAGNOSIS — E876 Hypokalemia: Secondary | ICD-10-CM | POA: Diagnosis not present

## 2023-11-13 DIAGNOSIS — I6350 Cerebral infarction due to unspecified occlusion or stenosis of unspecified cerebral artery: Secondary | ICD-10-CM | POA: Diagnosis not present

## 2023-11-13 DIAGNOSIS — I10 Essential (primary) hypertension: Secondary | ICD-10-CM

## 2023-11-13 DIAGNOSIS — K5901 Slow transit constipation: Secondary | ICD-10-CM | POA: Diagnosis not present

## 2023-11-13 LAB — GLUCOSE, CAPILLARY: Glucose-Capillary: 160 mg/dL — ABNORMAL HIGH (ref 70–99)

## 2023-11-13 NOTE — Progress Notes (Signed)
 PROGRESS NOTE   Subjective/Complaints: Pt upset that she is not getting the correct snack in the evenings. CBG's better.   ROS: Patient denies fever, rash, sore throat, blurred vision, dizziness, nausea, vomiting, diarrhea, cough, shortness of breath or chest pain, joint or back/neck pain, headache, or mood change.   Objective:   No results found.   No results for input(s): "WBC", "HGB", "HCT", "PLT" in the last 72 hours.  Recent Labs    11/11/23 0321 11/12/23 0338  NA 137 140  K 3.3* 3.3*  CL 108 110  CO2 20* 22  GLUCOSE 199* 173*  BUN 18 17  CREATININE 1.02* 0.98  CALCIUM 8.4* 8.5*    Intake/Output Summary (Last 24 hours) at 11/13/2023 0909 Last data filed at 11/13/2023 0758 Gross per 24 hour  Intake 656 ml  Output --  Net 656 ml         Physical Exam: Vital Signs Blood pressure (!) 144/67, pulse 76, temperature 98 F (36.7 C), resp. rate 18, height 5\' 3"  (1.6 m), weight 86.2 kg, SpO2 100%.  Constitutional: No distress . Vital signs reviewed. HEENT: NCAT, EOMI, oral membranes moist Neck: supple Cardiovascular: RRR without murmur. No JVD    Respiratory/Chest: CTA Bilaterally without wheezes or rales. Normal effort    GI/Abdomen: BS +, non-tender, non-distended Ext: no clubbing, cyanosis, or edema Psych: pleasant and cooperative  Skin: No evidence of breakdown, no evidence of rash Musculoskeletal:        General: Normal range of motion.     Cervical back: Neck supple. No tenderness.     Comments:  RUE- 3-/5  LUE- 5/5  RLE- 3-/5  LLE-5/5, stable exam 3/22  Neurological:     Mental Status: She is alert and oriented to person, place, and time.  Reduced sensation LT on right side including face  Bilateral mild ptosis    Assessment/Plan: 1. Functional deficits which require 3+ hours per day of interdisciplinary therapy in a comprehensive inpatient rehab setting. Physiatrist is providing close team  supervision and 24 hour management of active medical problems listed below. Physiatrist and rehab team continue to assess barriers to discharge/monitor patient progress toward functional and medical goals  Care Tool:  Bathing    Body parts bathed by patient: Right arm, Left arm, Chest, Abdomen, Right upper leg, Left upper leg, Face, Right lower leg, Left lower leg, Front perineal area, Buttocks   Body parts bathed by helper: Front perineal area, Buttocks     Bathing assist Assist Level: Contact Guard/Touching assist     Upper Body Dressing/Undressing Upper body dressing   What is the patient wearing?: Pull over shirt, Bra    Upper body assist Assist Level: Set up assist Assistive Device Comment: able to hook bra and spin around  Lower Body Dressing/Undressing Lower body dressing      What is the patient wearing?: Underwear/pull up, Pants     Lower body assist Assist for lower body dressing: Contact Guard/Touching assist     Toileting Toileting    Toileting assist Assist for toileting: Contact Guard/Touching assist     Transfers Chair/bed transfer  Transfers assist  Chair/bed transfer activity did not occur: Safety/medical concerns (  nausea, lethargy)  Chair/bed transfer assist level: Supervision/Verbal cueing     Locomotion Ambulation   Ambulation assist   Ambulation activity did not occur: Safety/medical concerns (nausea, lethargy)  Assist level: Supervision/Verbal cueing Assistive device: Walker-rolling Max distance: 150   Walk 10 feet activity   Assist  Walk 10 feet activity did not occur: Safety/medical concerns (nausea, lethargy)  Assist level: Supervision/Verbal cueing Assistive device: Walker-rolling   Walk 50 feet activity   Assist Walk 50 feet with 2 turns activity did not occur: Safety/medical concerns (nausea, lethargy)  Assist level: Supervision/Verbal cueing Assistive device: Walker-rolling    Walk 150 feet activity   Assist  Walk 150 feet activity did not occur: Safety/medical concerns (nausea, lethargy)  Assist level: Supervision/Verbal cueing Assistive device: Walker-rolling    Walk 10 feet on uneven surface  activity   Assist Walk 10 feet on uneven surfaces activity did not occur: Safety/medical concerns (nausea, lethargy)         Wheelchair     Assist Is the patient using a wheelchair?: Yes Type of Wheelchair: Manual Wheelchair activity did not occur: Safety/medical concerns (nausea, lethargy)         Wheelchair 50 feet with 2 turns activity    Assist    Wheelchair 50 feet with 2 turns activity did not occur: Safety/medical concerns (nausea, lethargy)       Wheelchair 150 feet activity     Assist  Wheelchair 150 feet activity did not occur: Safety/medical concerns (nausea, lethargy)       Blood pressure (!) 144/67, pulse 76, temperature 98 F (36.7 C), resp. rate 18, height 5\' 3"  (1.6 m), weight 86.2 kg, SpO2 100%. Medical Problem List and Plan: 1. 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  Impaired mobility: d/c lovenox given abdominal bruising and since patient is ambulating >150 feet, SCDs ordered             -antiplatelet therapy: Aspirin and Plavix for three weeks followed by Plavix alone (has ICA stent)   3. Diabetic peripheral neuropathy: Messaged Sherry to schedule for outpatient Qutenza. Tylenol as needed             -Lyrica 75 mg BID   4. Mood/Behavior/Sleep: LCSW to evaluate and provide emotional support             -antipsychotic agents: n/a   5. Neuropsych/cognition: This patient is capable of making decisions on her own behalf.   6. Skin/Wound Care: Routine skin care checks   7.  Fluids/Electrolytes/Nutrition: Routine Is and Os and follow-up chemistries             -vitamin B12 daily   8:    9: Hyperlipidemia: continue statin, Zetia, Repatha   10: DM: A1c = 9.2%             Continue insulin pump, discussed no more than 4U bolus to prevent hypoglycemia             -continue Jardiance 25 mg daily             -continue SSI  Advised stopping insulin between dinner and bedtime to prevent 3am hypoglycemia  Will request a [place] sandwich for HS snack CBG (last 3)  Recent Labs    11/10/23 1120 11/12/23 0538 11/13/23 0627  GLUCAP 205* 118* 160*   On chronic prednisone as well - IM consulting    3/22 improving control 11: 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 Nausea: thought to be 2/2 constipation but has not resolved with constipation management- patient now having BM, improved later in the day  20. Right upper extremity pain: VAS [place] ordered to assess for clot, discussed that this was negative  21. Emesis: continue IV zofran, medicine consulted for their opinion, thyroid labs were ordered last night and are abnormal, improved later in the day  22. Chest pain/tachycardia: resolved neg V/Q scan, EKG neg  23. Hypokalemia: d/c potassium since patient does not like supplement, improved with eating banana, asked nursing to encourage her to eat another banana today, repeat BMP ordered  tomorrow  3/22 potassium still low yesterday 3.3   -encourage K+ containing food/veggies   -repeat BMET Monday   24.  Mild DOE, no cough or fever, resp panel Neg, airborne prec d/ced  25.  Constipation: messaged nursing regarding when last BM was and was told it was 20th  26. Worsening left sided facial droop: CT head ordered and is stable  27. Hypotension/Dizziness: d/c avapro, resolved  28. Anemia: Hgb reviewed and is improving, monitor weekly  29. HTN:  bp's controlled off meds    LOS: 11 days A FACE TO FACE EVALUATION WAS PERFORMED  Ranelle Oyster 11/13/2023, 9:09 AM

## 2023-11-13 NOTE — Progress Notes (Signed)
 Physical Therapy Session Note  Patient Details  Name: [name] MRN: [id number] Date of Birth: 04/17/1951  Today's Date: 11/13/2023 PT Individual Time: 4098-1191 PT Individual Time Calculation (min): 72 min   Short Term Goals: Week 1:  PT Short Term Goal 1 (Week 1): pt will perform bed mobility with CGA overall PT Short Term Goal 1 - Progress (Week 1): Progressing toward goal PT Short Term Goal 2 (Week 1): pt will perform transfers with LRAD and CGA PT Short Term Goal 2 - Progress (Week 1): Met PT Short Term Goal 3 (Week 1): pt will ambulate 30ft with LRAD and CGA PT Short Term Goal 3 - Progress (Week 1): Met Week 2:  PT Short Term Goal 1 (Week 2): STG=LTG due to LOS  Skilled Therapeutic Interventions/Progress Updates:   Received pt semi-reclined in bed with sister at bedside. Pt agreeable to PT treatment and reported headache rated 6/10 (pt requesting to wait until after therapy for pain medication). Session with emphasis on functional mobility/transfers, dressing, generalized strengthening and endurance, dynamic standing balance/coordination, gait training, toileting, and simulated car transfers. Pt transferred semi-reclined<>sitting R EOB with HOB elevated with CGA for RLE management. Doffed dirty shirt with min A, applied deodorant without assist, and donned bra with min A to fasten clip. Donned clean shirt without assist.  Pt performed all transfers with RW and supervision throughout session. Pt removed pants without assist and donned clean pants sitting EOB with supervision and shoes with max A. Pt ambulated 124ft with RW and supervision to ortho gym. Pt performed ambulatory simulated car transfer with RW and supervision using BUE to get RLE in/out of car. Pt then ambulated 28ft on uneven surfaces (ramp) with RW and supervision.   Worked on RLE toe taps to 3 colored circles placed on 1in step 2x3 trials with heavy reliance on BUE support on RW with CGA for balance - emphasis on  control, coordination, and R hip flexor strength. Pt then ambulated additional 52ft with RW and supervision - emphasis on R foot clearance. Worked on blocked practice sit<>stands without AD and CGA/close supervision x 8 reps with emphasis on quad strength.   Returned to room and pt reported urge to toilet. Ambulated in/out of bathroom with RW and supervision and able to perform 3/3 toileting tasks without assist. Pt ambulated to sink and washed hands standing with supervision, then ambulated to bed. Removed shoes and transferred into supine with min A for RLE management. Concluded session with pt semi-reclined in bed, needs within reach, and bed alarm on.  Therapy Documentation Precautions:  Precautions Precautions: Fall Precaution/Restrictions Comments: R hemiparesis Restrictions Weight Bearing Restrictions Per Provider Order: No  Therapy/Group: Individual Therapy Marlana Salvage Zaunegger Blima Rich PT, DPT 11/13/2023, 6:52 AM

## 2023-11-13 NOTE — Progress Notes (Signed)
 Occupational Therapy Session Note  Patient Details  Name: [name] MRN: [id number] Date of Birth: [birth year]  Today's Date: 11/13/2023 OT Individual Time: 0250-0330 OT Individual Time Calculation (min): 40 min    Short Term Goals: Week 1:  OT Short Term Goal 1 (Week 1): Pt will complete sit > stand with CGA OT Short Term Goal 1 - Progress (Week 1): Met OT Short Term Goal 2 (Week 1): Pt will complete toilet transfer with CGA using LRAD OT Short Term Goal 2 - Progress (Week 1): Met OT Short Term Goal 3 (Week 1): Pt will donn LB clothing supervision using AE PRN OT Short Term Goal 3 - Progress (Week 1): Progressing toward goal  Skilled Therapeutic Interventions/Progress Updates:    Patient in bed at the time of arrival. The pt reported having a head ache and went on to say  that she asked nursing to administer pain meds after the occupational therapy treatment session. The pt was in agreement with  completing UB exercise to improve upon safe and Ind task performance. The pt began the session with come from sit to stand using the RW and bed for additional balance to come into standing 4x for a count of 10 with rest breaks as indicated, the pt required 1 rest break. The pt was instructed in safe transfers when incorporating a RW coming from sit to stand to minimize her risk for injury. The was able to complete the UB cycle while seated EOB for 15 minutes in duration with 1 rest break. The pt went on to change her clothing items into her pajamas.  The pt was able to remove her over head shirt with SBA and her bra with MinA for the fasteners.  The pt was able to come from sit to stand for doffing her pants with MinA for removing them over her feet. The pt was able to donn her over head night shirt with s/u A and she was able to donn her pajama pants with MinA.  The NT came in at the end of the session to measure the pt's blood sugar level and was able to assist the pt with all addition needs.    Therapy Documentation Precautions:  Precautions Precautions: Fall Precaution/Restrictions Comments: R hemiparesis Restrictions Weight Bearing Restrictions Per Provider Order: No  [name] 11/13/2023, 3:51 PM

## 2023-11-14 DIAGNOSIS — I10 Essential (primary) hypertension: Secondary | ICD-10-CM | POA: Diagnosis not present

## 2023-11-14 DIAGNOSIS — E876 Hypokalemia: Secondary | ICD-10-CM | POA: Diagnosis not present

## 2023-11-14 DIAGNOSIS — K5901 Slow transit constipation: Secondary | ICD-10-CM | POA: Diagnosis not present

## 2023-11-14 DIAGNOSIS — I6350 Cerebral infarction due to unspecified occlusion or stenosis of unspecified cerebral artery: Secondary | ICD-10-CM | POA: Diagnosis not present

## 2023-11-14 LAB — GLUCOSE, CAPILLARY: Glucose-Capillary: 144 mg/dL — ABNORMAL HIGH (ref 70–99)

## 2023-11-14 NOTE — Progress Notes (Signed)
 PROGRESS NOTE   Subjective/Complaints: Pt got her [place] sandwich last night! She was happy :)  Feels constipated despite having small/medium type 3 and 4 stools yesterday  ROS: Patient denies fever, rash, sore throat, blurred vision, dizziness, nausea, vomiting, diarrhea, cough, shortness of breath or chest pain, joint or back/neck pain, headache, or mood change. .   Objective:   No results found.   No results for input(s): "WBC", "HGB", "HCT", "PLT" in the last 72 hours.  Recent Labs    11/12/23 0338  NA 140  K 3.3*  CL 110  CO2 22  GLUCOSE 173*  BUN 17  CREATININE 0.98  CALCIUM 8.5*    Intake/Output Summary (Last 24 hours) at 11/14/2023 0802 Last data filed at 11/14/2023 0734 Gross per 24 hour  Intake 960 ml  Output --  Net 960 ml         Physical Exam: Vital Signs Blood pressure (!) 141/60, pulse 77, temperature 97.9 F (36.6 C), temperature source Oral, resp. rate 18, height 5\' 3"  (1.6 m), weight 86.2 kg, SpO2 100%.  Constitutional: No distress . Vital signs reviewed. HEENT: NCAT, EOMI, oral membranes moist Neck: supple Cardiovascular: RRR without murmur. No JVD    Respiratory/Chest: CTA Bilaterally without wheezes or rales. Normal effort    GI/Abdomen: BS +, non-tender, non-distended Ext: no clubbing, cyanosis, or edema Psych: pleasant and cooperative  Skin: No evidence of breakdown, no evidence of rash Musculoskeletal:        General: Normal range of motion.     Cervical back: Neck supple. No tenderness.     Comments:  RUE- 3-/5  LUE- 5/5  RLE- 3-/5  LLE-5/5, stable exam 3/23  Neurological:     Mental Status: She is alert and oriented to person, place, and time.  Reduced sensation LT on right side including face  Bilateral mild ptosis    Assessment/Plan: 1. Functional deficits which require 3+ hours per day of interdisciplinary therapy in a comprehensive inpatient rehab  setting. Physiatrist is providing close team supervision and 24 hour management of active medical problems listed below. Physiatrist and rehab team continue to assess barriers to discharge/monitor patient progress toward functional and medical goals  Care Tool:  Bathing    Body parts bathed by patient: Right arm, Left arm, Chest, Abdomen, Right upper leg, Left upper leg, Face, Right lower leg, Left lower leg, Front perineal area, Buttocks   Body parts bathed by helper: Front perineal area, Buttocks     Bathing assist Assist Level: Contact Guard/Touching assist     Upper Body Dressing/Undressing Upper body dressing   What is the patient wearing?: Pull over shirt, Bra    Upper body assist Assist Level: Set up assist Assistive Device Comment: able to hook bra and spin around  Lower Body Dressing/Undressing Lower body dressing      What is the patient wearing?: Underwear/pull up, Pants     Lower body assist Assist for lower body dressing: Contact Guard/Touching assist     Toileting Toileting    Toileting assist Assist for toileting: Contact Guard/Touching assist     Transfers Chair/bed transfer  Transfers assist  Chair/bed transfer activity did not occur: Safety/medical  concerns (nausea, lethargy)  Chair/bed transfer assist level: Supervision/Verbal cueing     Locomotion Ambulation   Ambulation assist   Ambulation activity did not occur: Safety/medical concerns (nausea, lethargy)  Assist level: Supervision/Verbal cueing Assistive device: Walker-rolling Max distance: 150   Walk 10 feet activity   Assist  Walk 10 feet activity did not occur: Safety/medical concerns (nausea, lethargy)  Assist level: Supervision/Verbal cueing Assistive device: Walker-rolling   Walk 50 feet activity   Assist Walk 50 feet with 2 turns activity did not occur: Safety/medical concerns (nausea, lethargy)  Assist level: Supervision/Verbal cueing Assistive device:  Walker-rolling    Walk 150 feet activity   Assist Walk 150 feet activity did not occur: Safety/medical concerns (nausea, lethargy)  Assist level: Supervision/Verbal cueing Assistive device: Walker-rolling    Walk 10 feet on uneven surface  activity   Assist Walk 10 feet on uneven surfaces activity did not occur: Safety/medical concerns (nausea, lethargy)   Assist level: Supervision/Verbal cueing Assistive device: Walker-rolling   Wheelchair     Assist Is the patient using a wheelchair?: Yes Type of Wheelchair: Manual Wheelchair activity did not occur: Safety/medical concerns (nausea, lethargy)         Wheelchair 50 feet with 2 turns activity    Assist    Wheelchair 50 feet with 2 turns activity did not occur: Safety/medical concerns (nausea, lethargy)       Wheelchair 150 feet activity     Assist  Wheelchair 150 feet activity did not occur: Safety/medical concerns (nausea, lethargy)       Blood pressure (!) 141/60, pulse 77, temperature 97.9 F (36.6 C), temperature source Oral, resp. rate 18, height 5\' 3"  (1.6 m), weight 86.2 kg, SpO2 100%. Medical Problem List and Plan: 1. 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  Impaired mobility: d/c lovenox given abdominal bruising and since patient is ambulating >150 feet, SCDs ordered             -antiplatelet therapy: Aspirin and Plavix for three weeks followed by Plavix alone (has ICA stent)   3. Diabetic peripheral neuropathy: Messaged Sherry to schedule for outpatient Qutenza. Tylenol as needed             -Lyrica 75 mg BID   4. Mood/Behavior/Sleep: LCSW to evaluate and provide emotional support             -antipsychotic agents: n/a   5.  Neuropsych/cognition: This patient is capable of making decisions on her own behalf.   6. Skin/Wound Care: Routine skin care checks   7. Fluids/Electrolytes/Nutrition: Routine Is and Os and follow-up chemistries             -vitamin B12 daily   8:    9: Hyperlipidemia: continue statin, Zetia, Repatha   10: DM: A1c = 9.2%             Continue insulin pump, discussed no more than 4U bolus to prevent hypoglycemia             -continue Jardiance 25 mg daily             -  continue SSI  Advised stopping insulin between dinner and bedtime to prevent 3am hypoglycemia  Requested a [place] sandwich for HS snack CBG (last 3)  Recent Labs    11/12/23 0538 11/13/23 0627 11/14/23 0621  GLUCAP 118* 160* 144*   On chronic prednisone as well - IM consulting    3/23 improving control--no changes today 11: 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 at home) - likely need to increase to 100 mg QHS_ having HA's 5-6/10 daily now   14: OSA on CPAP   15: History of breast cancer: continue tamoxifen   16: CKD stage II: Serum creatinine at or near baseline -follow-up BMP 3/24   17: Chronic anemia: improving, monitor weekly   18: Leukopenia: resolved, monitor weekly   19. Nausea: thought to be 2/2 constipation but has not resolved with constipation management- patient now having BM, improved later in the day  20. Right upper extremity pain: VAS [place] ordered to assess for clot, discussed that this was negative  21. Emesis: continue IV zofran, medicine consulted for their opinion, thyroid labs were ordered last night and are abnormal, improved later in the day  22. Chest pain/tachycardia: resolved neg V/Q scan, EKG neg  23. Hypokalemia: d/c  potassium since patient does not like supplement, improved with eating banana, asked nursing to encourage her to eat another banana today, repeat BMP ordered tomorrow  3/22 potassium still low yesterday 3.3   -encourage K+ containing food/veggies   -repeat BMET Monday   24.  Mild DOE, no cough or fever, resp panel Neg, airborne prec d/ced  25.  Constipation:   3/23 moved bowels twice yesterday  -will give 30cc sorbitol today as pt still feels full  26. Worsening left sided facial droop: CT head ordered and is stable  27. Hypotension/Dizziness: d/c avapro, resolved  28. Anemia: Hgb reviewed and is improving, monitor weekly  29. HTN:  bp's controlled off meds    LOS: 12 days A FACE TO FACE EVALUATION WAS PERFORMED  Ranelle Oyster 11/14/2023, 8:02 AM

## 2023-11-15 DIAGNOSIS — I6350 Cerebral infarction due to unspecified occlusion or stenosis of unspecified cerebral artery: Secondary | ICD-10-CM | POA: Diagnosis not present

## 2023-11-15 LAB — GLUCOSE, CAPILLARY
Glucose-Capillary: 53 mg/dL — ABNORMAL LOW (ref 70–99)
Glucose-Capillary: 98 mg/dL (ref 70–99)
Glucose-Capillary: 232 mg/dL — ABNORMAL HIGH (ref 70–99)

## 2023-11-15 LAB — CBC
HCT: 26.8 % — ABNORMAL LOW (ref 36.0–46.0)
Hemoglobin: 8.7 g/dL — ABNORMAL LOW (ref 12.0–15.0)
MCH: 30.9 pg (ref 26.0–34.0)
MCHC: 32.5 g/dL (ref 30.0–36.0)
MCV: 95 fL (ref 80.0–100.0)
Platelets: 232 10*3/uL (ref 150–400)
RBC: 2.82 MIL/uL — ABNORMAL LOW (ref 3.87–5.11)
RDW: 12.3 % (ref 11.5–15.5)
WBC: 8.1 10*3/uL (ref 4.0–10.5)
nRBC: 0 % (ref 0.0–0.2)

## 2023-11-15 LAB — BASIC METABOLIC PANEL
Anion gap: 4 — ABNORMAL LOW (ref 5–15)
BUN: 18 mg/dL (ref 8–23)
CO2: 23 mmol/L (ref 22–32)
Calcium: 8.2 mg/dL — ABNORMAL LOW (ref 8.9–10.3)
Chloride: 115 mmol/L — ABNORMAL HIGH (ref 98–111)
Creatinine, Ser: 0.98 mg/dL (ref 0.44–1.00)
GFR, Estimated: >60 mL/min (ref >60)
Glucose, Bld: 116 mg/dL — ABNORMAL HIGH (ref 70–99)
Potassium: 3.4 mmol/L — ABNORMAL LOW (ref 3.5–5.1)
Sodium: 142 mmol/L (ref 135–145)

## 2023-11-15 MED ORDER — INSULIN GLARGINE-YFGN 100 UNIT/ML ~~LOC~~ SOLN
40.0000 [IU] | Freq: Every day | SUBCUTANEOUS | Status: DC
  Filled 2023-11-15 (×2): qty 0.4

## 2023-11-15 MED ORDER — INSULIN ASPART 100 UNIT/ML IJ SOLN
0.0000 [IU] | Freq: Every day | INTRAMUSCULAR | Status: DC
  Administered 2023-11-15: 5 [IU] via SUBCUTANEOUS

## 2023-11-15 MED ORDER — INSULIN ASPART 100 UNIT/ML IJ SOLN: 0.0000 [IU] | Freq: Three times a day (TID) | INTRAMUSCULAR | Status: DC

## 2023-11-15 MED ORDER — INSULIN ASPART 100 UNIT/ML IJ SOLN
8.0000 [IU] | Freq: Three times a day (TID) | INTRAMUSCULAR | Status: DC
Start: 2023-11-15 — End: 2023-11-16

## 2023-11-15 NOTE — Inpatient Diabetes Management (Addendum)
 Inpatient Diabetes Program Recommendations  AACE/ADA: New Consensus Statement on Inpatient Glycemic Control (2015)  Target Ranges:  Prepandial:   less than 140 mg/dL      Peak postprandial:   less than 180 mg/dL (1-2 hours)      Critically ill patients:  140 - 180 mg/dL   Lab Results  Component Value Date   GLUCAP 98 11/15/2023   HGBA1C 9.2 (H) 10/29/2023    Review of Glycemic Control  Latest Reference Range & Units 11/09/23 06:21 11/09/23 07:11 11/09/23 16:54 11/09/23 21:16 11/10/23 06:32  Glucose-Capillary 70 - 99 mg/dL 44 (LL) 914 (H) 782 (H) 303 (H) 90    Latest Reference Range & Units 11/10/23 06:32 11/10/23 11:20 11/12/23 05:38 11/13/23 06:27  Glucose-Capillary 70 - 99 mg/dL 90 956 (H) 213 (H) 086 (H)    Latest Reference Range & Units 11/13/23 06:27 11/14/23 06:21 11/15/23 06:01 11/15/23 06:33  Glucose-Capillary 70 - 99 mg/dL 578 (H) 469 (H) 53 (L) 98   Diabetes history: Type 2 DM  Outpatient Diabetes medications:  Tresiba 15 units daily , Jardiance 25 mg qd, Bydurian 2 mg qweek, omnipod insulin pump (using Humalog insulin)- last visible settings for insulin pump were as follows from 10/23/21:   Basal insulin  0000-0400       1.0 units/hour  0401-1300       1.4 units/hour  1301-1800       1.65 units/hour  1801-0000       1.45 units/hour  Total daily basal insulin: 33.55 units/24 hours  Current orders for Inpatient glycemic control: insulin pump, Jardiance 25 mg QD  Prednisone 7.5 mg every day   Inpatient Diabetes Program Recommendations:    Glucose trends not always consistent with pattern, some hypoglycemia in the am. Pt using CGM to manually bolus with insulin pump (pump unavailable for auto-mode at this time). Called Dr. Talmage Nap for insulin pump change suggestions. Pt had appointment with Dr. Talmage Nap when she was admitted into the hospital. Will follow for adjustments. Expected d/c on 3/27.  Addendum 3:38 pm:  Spoke with Dr, Talmage Nap over the phone regarding insulin pump  changes. Dr. Talmage Nap requests pt call insulin pump trainer with outpatient services for glucose trends review and possible adjustments. For now take pt off insulin pump and starts Lantus 40 units, Novolog 0-15 units tid + hs and Novolog 8 units tid meal coverage if eating >50% of meals, Continue Jardiance, may continue CGM while here however I do also suggest fingerstick readings as I am not sure CGM is accurately reading trends.  Thanks, [name] Deem RN, MSN, BC-ADM Inpatient Diabetes Coordinator Team Pager [phone] (8a-5p)

## 2023-11-15 NOTE — Progress Notes (Signed)
 Spoke with patient about blood sugar concerns with insulin pump and what the Diabetes coordinator had recommended per her endocrinologist. Patient does not want to stop using her pump right now because she just changed out the pump and they are too expensive to waste. She was agreeable to not put the next one on that's due Wednesday but will talk more in depth about this with MD in the morning. Patients pump read 253 and she bolus herself 6.65 units and refused any standing and long acting coverage for tonight.    Tori Milks LPN

## 2023-11-15 NOTE — Progress Notes (Signed)
 PROGRESS NOTE   Subjective/Complaints: Sleepy this morning Was hypoglycemic over night No other issues overnight Labs reviewed and are stable  ROS: Patient denies fever, rash, sore throat, blurred vision, dizziness, nausea, vomiting, diarrhea, cough, shortness of breath or chest pain, joint or back/neck pain, headache, or mood change. .   Objective:   No results found.   Recent Labs    11/15/23 0424  WBC 8.1  HGB 8.7*  HCT 26.8*  PLT 232    Recent Labs    11/15/23 0424  NA 142  K 3.4*  CL 115*  CO2 23  GLUCOSE 116*  BUN 18  CREATININE 0.98  CALCIUM 8.2*    Intake/Output Summary (Last 24 hours) at 11/15/2023 0930 Last data filed at 11/15/2023 0815 Gross per 24 hour  Intake 960 ml  Output --  Net 960 ml         Physical Exam: Vital Signs Blood pressure 135/65, pulse 70, temperature 98.4 F (36.9 C), resp. rate 18, height 5\' 3"  (1.6 m), weight 86.2 kg, SpO2 97%.  Constitutional: No distress . Vital signs reviewed. HEENT: NCAT, EOMI, oral membranes moist Neck: supple Cardiovascular: RRR without murmur. No JVD    Respiratory/Chest: CTA Bilaterally without wheezes or rales. Normal effort    GI/Abdomen: BS +, non-tender, non-distended Ext: no clubbing, cyanosis, or edema Psych: pleasant and cooperative  Skin: No evidence of breakdown, no evidence of rash Musculoskeletal:        General: Normal range of motion.     Cervical back: Neck supple. No tenderness.     Comments:  RUE- 3-/5  LUE- 5/5  RLE- 3-/5  LLE-5/5, stable 3/24  Neurological:     Mental Status: She is alert and oriented to person, place, and time.  Reduced sensation LT on right side including face  Bilateral mild ptosis    Assessment/Plan: 1. Functional deficits which require 3+ hours per day of interdisciplinary therapy in a comprehensive inpatient rehab setting. Physiatrist is providing close team supervision and 24 hour  management of active medical problems listed below. Physiatrist and rehab team continue to assess barriers to discharge/monitor patient progress toward functional and medical goals  Care Tool:  Bathing    Body parts bathed by patient: Right arm, Left arm, Chest, Abdomen, Right upper leg, Left upper leg, Face, Right lower leg, Left lower leg, Front perineal area, Buttocks   Body parts bathed by helper: Front perineal area, Buttocks     Bathing assist Assist Level: Contact Guard/Touching assist     Upper Body Dressing/Undressing Upper body dressing   What is the patient wearing?: Pull over shirt, Bra    Upper body assist Assist Level: Set up assist Assistive Device Comment: able to hook bra and spin around  Lower Body Dressing/Undressing Lower body dressing      What is the patient wearing?: Underwear/pull up, Pants     Lower body assist Assist for lower body dressing: Contact Guard/Touching assist     Toileting Toileting    Toileting assist Assist for toileting: Contact Guard/Touching assist     Transfers Chair/bed transfer  Transfers assist  Chair/bed transfer activity did not occur: Safety/medical concerns (nausea, lethargy)  Chair/bed transfer assist level: Supervision/Verbal cueing     Locomotion Ambulation   Ambulation assist   Ambulation activity did not occur: Safety/medical concerns (nausea, lethargy)  Assist level: Supervision/Verbal cueing Assistive device: Walker-rolling Max distance: 150   Walk 10 feet activity   Assist  Walk 10 feet activity did not occur: Safety/medical concerns (nausea, lethargy)  Assist level: Supervision/Verbal cueing Assistive device: Walker-rolling   Walk 50 feet activity   Assist Walk 50 feet with 2 turns activity did not occur: Safety/medical concerns (nausea, lethargy)  Assist level: Supervision/Verbal cueing Assistive device: Walker-rolling    Walk 150 feet activity   Assist Walk 150 feet activity  did not occur: Safety/medical concerns (nausea, lethargy)  Assist level: Supervision/Verbal cueing Assistive device: Walker-rolling    Walk 10 feet on uneven surface  activity   Assist Walk 10 feet on uneven surfaces activity did not occur: Safety/medical concerns (nausea, lethargy)   Assist level: Supervision/Verbal cueing Assistive device: Walker-rolling   Wheelchair     Assist Is the patient using a wheelchair?: Yes Type of Wheelchair: Manual Wheelchair activity did not occur: Safety/medical concerns (nausea, lethargy)         Wheelchair 50 feet with 2 turns activity    Assist    Wheelchair 50 feet with 2 turns activity did not occur: Safety/medical concerns (nausea, lethargy)       Wheelchair 150 feet activity     Assist  Wheelchair 150 feet activity did not occur: Safety/medical concerns (nausea, lethargy)       Blood pressure 135/65, pulse 70, temperature 98.4 F (36.9 C), resp. rate 18, height 5\' 3"  (1.6 m), weight 86.2 kg, SpO2 97%. Medical Problem List and Plan: 1. 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  Impaired mobility: d/c lovenox given abdominal bruising and since patient is ambulating >150 feet, SCDs ordered             -antiplatelet therapy: Aspirin and Plavix for three weeks followed by Plavix alone (has ICA stent)   3. Diabetic peripheral neuropathy: Messaged Sherry to schedule for outpatient Qutenza. Tylenol as needed             -Lyrica 75 mg BID   4. Mood/Behavior/Sleep: LCSW to evaluate and provide emotional support             -antipsychotic agents: n/a   5. Neuropsych/cognition: This patient is capable of making decisions on her own behalf.   6. Skin/Wound Care:  Routine skin care checks   7. Fluids/Electrolytes/Nutrition: Routine Is and Os and follow-up chemistries             -vitamin B12 daily   8:    9: Hyperlipidemia: continue statin, Zetia, Repatha   10: DM: A1c = 9.2%             Continue insulin pump, discussed no more than 4U bolus to prevent hypoglycemia             -continue Jardiance 25 mg daily             -continue SSI  Advised  stopping insulin between dinner and bedtime to prevent 3am hypoglycemia  Requested a [place] sandwich for HS snack CBG (last 3)  Recent Labs    11/14/23 0621 11/15/23 0601 11/15/23 0633  GLUCAP 144* 53* 98   On chronic prednisone as well - IM consulting    3/23 improving control--no changes today 11: 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 at home) - likely need to increase to 100 mg QHS_ having HA's 5-6/10 daily now   14: OSA on CPAP   15: History of breast cancer: continue tamoxifen   16: CKD stage II: reviewed and improved     18: Leukopenia: resolved, monitor weekly   19. Nausea: resolved  20. Right upper extremity pain: VAS [place] ordered to assess for clot, discussed that this was negative  21. Emesis: resolved  22. Chest pain/tachycardia: resolved neg V/Q scan, EKG neg  23. Hypokalemia: K+ improving, continue to encourage fruits and vegetables   24.  Mild DOE, no cough or fever, resp panel Neg, airborne prec d/ced  25.  Constipation:   Last BM 3/23, d/c colace  26. Worsening left sided facial droop: CT head ordered and is stable  27. Hypotension/Dizziness: d/c avapro, resolved  28. Anemia: Hgb reviewed and is stable, monitor weekly.   29. HTN:  BP reviewed and is stable  30. Foot drop: discussed with PT potential  AFO    LOS: 13 days A FACE TO FACE EVALUATION WAS PERFORMED  Auren Valdes P Lataisha Colan 11/15/2023, 9:30 AM

## 2023-11-15 NOTE — Progress Notes (Addendum)
 Patient ID: [name], female   DOB: Feb 02, 1951, 73 y.o.   MRN: [id number]  SW received updates from [employer] pt received RW in 05/20/2022 and no eligible for new RW until 05/22/27.  SW faxed outpatient PT/OT referral to [facility].   Cecile Sheerer, MSW, LCSW Office: [phone] Cell: [phone] Fax: [phone]

## 2023-11-15 NOTE — Progress Notes (Signed)
 Occupational Therapy Session Note  Patient Details  Name: [name] MRN: [id number] Date of Birth: Jul 31, 1951  Today's Date: 11/15/2023 OT Individual Time: 6295-2841 & 3244-0102 OT Individual Time Calculation (min): 55 min & 40 min   Short Term Goals: Week 2:  OT Short Term Goal 1 (Week 2): Pt will donn UB clothing with supervision OT Short Term Goal 2 (Week 2): Pt will improve RUE AROM in pain free zone to 100 degrees shoulder flexion to assist with UB dressing OT Short Term Goal 3 (Week 2): Pt will complete toilet transfer using LRAD with supervision  Skilled Therapeutic Interventions/Progress Updates:  Session 1 Skilled OT intervention completed with focus on ADL retraining, functional mobility, activity tolerance. Pt received upright in bed, agreeable to session. No pain verbalized.  Pt requested to start session with getting ready for day. Transitioned to EOB with mod I with increased time- pt using hands to assist RLE. Completed all sit > stands and ambulatory transfers using RW with supervision during session. Of note- pt with new toe up brace on RLE to assist with foot drop and RLE noted to have improved clearance.   Ambulated > w/c at sink. Pt able to wash UB, apply deo with mod I. Dressed UB including bra with set up A with pt using overhead method for pre-strapped bra that initially required mod A. Supervision for doffing/donning of LB clothing however rest break in between bouts of sit <> stands.   Assist needed to fasten RLE toe up brace, with current shoes unable to fit due to tight nature. Discussed using lace up tennis shoes at home and that she can unfasten the brace when at rest. Pt ambulated about 75 ft using RW in hallway with w/c follow for fatigue however did not use. Pt requested to return to bed due to interrupted sleep. Education provided on leg lifter technique for RLE management. With gait belt method, pt was mod I with bed mobility, with plan to issue leg lifter  when in stock. Pt remained upright in bed, with bed alarm on/activated, and with all needs in reach at end of session.  Session 2 Skilled OT intervention completed with focus on family education regarding BADL and functional mobility recommendations. Pt received upright in bed with 1 sister, [name] present, agreeable to session. Unrated pain reported in R shoulder; declined intervention. OT offered rest breaks, repositioning throughout for pain reduction.  Pt agreeable to toilet at start of session while waiting for other sister to arrive. Transitioned to EOB with mod I, using BUE to lift RLE. Assist needed to fasten RLE toe up brace. Completed all sit > stands and ambulatory transfers using RW throughout session.   Ambulated > bathroom. Continent or urinary void and supervision for all toileting steps. Ambulated to sink, for hand hygiene in stance, then to EOB. Pt's glucose machine notified pt of CBG at 256 and self-administered 8.15 units of insulin. Nurse notified of status.   Pt ambulated 150 ft using RW > gym, with prolonged rest break on EOM with both sisters now present. Discussed pt's improved dizziness symptoms, and LOF to grossly supervision however progressing to mod I per pt's goals. Suggested supervision for showers due to lack of full showers in IPR due to active port/dizziness symptoms, to promote increased safety and assist where there may be a gap in her preparedness. Demonstrated/discussed donning/doffing of toe up brace. Ambulated back to room, where pt remained seated in w/c, with chair alarm on/activated, and with all needs  in reach at end of session.   Therapy Documentation Precautions:  Precautions Precautions: Fall Precaution/Restrictions Comments: R hemiparesis Restrictions Weight Bearing Restrictions Per Provider Order: No    Therapy/Group: Individual Therapy  Melvyn Novas, MS, OTR/L  11/15/2023, 1:49 PM

## 2023-11-15 NOTE — Progress Notes (Signed)
 Physical Therapy Session Note  Patient Details  Name: [name] MRN: [id number] Date of Birth: 1950-12-15  Today's Date: 11/15/2023 PT Individual Time: [phone], 1345-1441  PT Individual Time Calculation (min): 45 min , 56 min   Short Term Goals: Week 2:  PT Short Term Goal 1 (Week 2): STG=LTG due to LOS  Skilled Therapeutic Interventions/Progress Updates:      Therapy Documentation Precautions:  Precautions Precautions: Fall Precaution/Restrictions Comments: R hemiparesis Restrictions Weight Bearing Restrictions Per Provider Order: No   Treatment Session 1:   Pt agreeable to PT session with emphasis on gait analysis and pt education concerning right foot drop. Pt presents with decreased hip flexion and dorsiflexion of right LE with gait with RW (supervision) room level. PT educated pt on AFO's to decrease fall risk and improve foot clearance with gait. Pt receptive to try right foot up brace and demonstrates improve foot clearance and safety with gait ~30' room level with RW (supervision). Plan to follow up with patient at afternoon session to determine next steps. Pt without verbal reports of pain.   Treatment Session 2:   Pt and sisters present for family training to prepare for discharge. Pt without reports of pain and PT provided education on AFO (right foot up brace), bed mobility with adaptive equipment (leg lifter), and stair navigation to simulate home set-up. Pt supervision with bed mobility, transfers, and gait with RW >100' in session. Discussed improved foot clearance with use of AFO. Pt performed steps with min A with support from sister with prior technique of use of door frame and sisters forearm for support ascending/descending 6 inch steps x 3. Pt left seated at bedside with all needs in reach and family present.   Therapy/Group: Individual Therapy  Truitt Leep Truitt Leep PT, DPT  11/15/2023, 12:00 PM

## 2023-11-16 DIAGNOSIS — I6350 Cerebral infarction due to unspecified occlusion or stenosis of unspecified cerebral artery: Secondary | ICD-10-CM | POA: Diagnosis not present

## 2023-11-16 LAB — GLUCOSE, CAPILLARY
Glucose-Capillary: 132 mg/dL — ABNORMAL HIGH (ref 70–99)
Glucose-Capillary: 213 mg/dL — ABNORMAL HIGH (ref 70–99)
Glucose-Capillary: 217 mg/dL — ABNORMAL HIGH (ref 70–99)

## 2023-11-16 MED ORDER — DOCUSATE SODIUM 100 MG PO CAPS
100.0000 mg | ORAL_CAPSULE | Freq: Every day | ORAL | Status: DC
  Administered 2023-11-16 – 2023-11-18 (×3): 100 mg via ORAL
  Filled 2023-11-16 (×3): qty 1

## 2023-11-16 MED ORDER — INSULIN PUMP
Freq: Three times a day (TID) | SUBCUTANEOUS | Status: DC
  Administered 2023-11-16: 10.5 via SUBCUTANEOUS
  Filled 2023-11-16: qty 1

## 2023-11-16 NOTE — Progress Notes (Signed)
 Physical Therapy Session Note  Patient Details  Name: [name] MRN: [id number] Date of Birth: July 18, 1951  Today's Date: 11/16/2023 PT Individual Time: 1304-1402 PT Individual Time Calculation (min): 58 min   Short Term Goals: Week 2:  PT Short Term Goal 1 (Week 2): STG=LTG due to LOS  Skilled Therapeutic Interventions/Progress Updates:      Therapy Documentation Precautions:  Precautions Precautions: Fall Precaution/Restrictions Comments: R hemiparesis Restrictions Weight Bearing Restrictions Per Provider Order: No  Pt agreeable to PT with emphasis on dynamic balance and LE strength training to decrease fall risk and increase independence with mobility. Donned Rt XL foot up brace and dependent transport by w/c to main gym. In parallel bars pt required B UE support and CGA for static standing balance with narrow base of support and eyes closed in standard and semi-tandem, pt presents with increased postural sway. Pt provided with HEP with following exercises to improve LE strength and balance:   -staggered stance squat B UE progress to unilateral support 2 x 8   -standing marches B UE 3 x 10   -lateral stepping with counter (discussed but unable to perform due to time)   Pt returned to room by w/c and supervision ambulatory transfer to toilet, continent of bowel and bladder and left seated at bedside with all needs in reach. Pt without reports of pain.     Therapy/Group: Individual Therapy  Truitt Leep Truitt Leep PT, DPT  11/16/2023, 3:40 PM

## 2023-11-16 NOTE — Progress Notes (Signed)
 PROGRESS NOTE   Subjective/Complaints: Patient says CBGs dropped to 100 last night, she would like to maintain insulin pump rather than switch to injected insulin She did not receive HS snack last night  ROS: +constipation  Objective:   No results found.   Recent Labs    11/15/23 0424  WBC 8.1  HGB 8.7*  HCT 26.8*  PLT 232    Recent Labs    11/15/23 0424  NA 142  K 3.4*  CL 115*  CO2 23  GLUCOSE 116*  BUN 18  CREATININE 0.98  CALCIUM 8.2*    Intake/Output Summary (Last 24 hours) at 11/16/2023 0923 Last data filed at 11/16/2023 0731 Gross per 24 hour  Intake 960 ml  Output --  Net 960 ml         Physical Exam: Vital Signs Blood pressure (!) 155/58, pulse 75, temperature (!) 97.4 F (36.3 C), temperature source Oral, resp. rate 16, height 5\' 3"  (1.6 m), weight 86.2 kg, SpO2 100%.  Constitutional: No distress . Vital signs reviewed. HEENT: NCAT, EOMI, oral membranes moist Neck: supple Cardiovascular: RRR without murmur. No JVD    Respiratory/Chest: CTA Bilaterally without wheezes or rales. Normal effort    GI/Abdomen: BS +, non-tender, non-distended Ext: no clubbing, cyanosis, or edema Psych: pleasant and cooperative  Skin: No evidence of breakdown, no evidence of rash Musculoskeletal:        General: Normal range of motion.     Cervical back: Neck supple. No tenderness.     Comments:  RUE- 3-/5  LUE- 5/5  RLE- 3-/5  LLE-5/5, stable 3/25  Neurological:     Mental Status: She is alert and oriented to person, place, and time.  Reduced sensation LT on right side including face  Bilateral mild ptosis    Assessment/Plan: 1. Functional deficits which require 3+ hours per day of interdisciplinary therapy in a comprehensive inpatient rehab setting. Physiatrist is providing close team supervision and 24 hour management of active medical problems listed below. Physiatrist and rehab team continue  to assess barriers to discharge/monitor patient progress toward functional and medical goals  Care Tool:  Bathing    Body parts bathed by patient: Right arm, Left arm, Chest, Abdomen, Right upper leg, Left upper leg, Face, Right lower leg, Left lower leg, Front perineal area, Buttocks   Body parts bathed by helper: Front perineal area, Buttocks     Bathing assist Assist Level: Contact Guard/Touching assist     Upper Body Dressing/Undressing Upper body dressing   What is the patient wearing?: Pull over shirt, Bra    Upper body assist Assist Level: Set up assist Assistive Device Comment: able to hook bra and spin around  Lower Body Dressing/Undressing Lower body dressing      What is the patient wearing?: Underwear/pull up, Pants     Lower body assist Assist for lower body dressing: Contact Guard/Touching assist     Toileting Toileting    Toileting assist Assist for toileting: Contact Guard/Touching assist     Transfers Chair/bed transfer  Transfers assist  Chair/bed transfer activity did not occur: Safety/medical concerns (nausea, lethargy)  Chair/bed transfer assist level: Supervision/Verbal cueing  Locomotion Ambulation   Ambulation assist   Ambulation activity did not occur: Safety/medical concerns (nausea, lethargy)  Assist level: Supervision/Verbal cueing Assistive device: Walker-rolling Max distance: 150   Walk 10 feet activity   Assist  Walk 10 feet activity did not occur: Safety/medical concerns (nausea, lethargy)  Assist level: Supervision/Verbal cueing Assistive device: Walker-rolling   Walk 50 feet activity   Assist Walk 50 feet with 2 turns activity did not occur: Safety/medical concerns (nausea, lethargy)  Assist level: Supervision/Verbal cueing Assistive device: Walker-rolling    Walk 150 feet activity   Assist Walk 150 feet activity did not occur: Safety/medical concerns (nausea, lethargy)  Assist level:  Supervision/Verbal cueing Assistive device: Walker-rolling    Walk 10 feet on uneven surface  activity   Assist Walk 10 feet on uneven surfaces activity did not occur: Safety/medical concerns (nausea, lethargy)   Assist level: Supervision/Verbal cueing Assistive device: Walker-rolling   Wheelchair     Assist Is the patient using a wheelchair?: Yes Type of Wheelchair: Manual Wheelchair activity did not occur: Safety/medical concerns (nausea, lethargy)         Wheelchair 50 feet with 2 turns activity    Assist    Wheelchair 50 feet with 2 turns activity did not occur: Safety/medical concerns (nausea, lethargy)       Wheelchair 150 feet activity     Assist  Wheelchair 150 feet activity did not occur: Safety/medical concerns (nausea, lethargy)       Blood pressure (!) 155/58, pulse 75, temperature (!) 97.4 F (36.3 C), temperature source Oral, resp. rate 16, height 5\' 3"  (1.6 m), weight 86.2 kg, SpO2 100%. Medical Problem List and Plan: 1. 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  Impaired mobility: d/c lovenox given abdominal bruising and since patient is ambulating >150 feet, continue SCDs             -antiplatelet therapy: Aspirin and Plavix for three weeks followed by Plavix alone (has ICA stent)   3. Diabetic peripheral neuropathy: Messaged Sherry to schedule for outpatient Qutenza. Tylenol as needed             -continue Lyrica 75 mg BID   4. Mood/Behavior/Sleep: LCSW to evaluate and provide emotional support             -antipsychotic agents: n/a   5. Neuropsych/cognition: This patient is capable of making decisions on her own behalf.   6. Skin/Wound Care: Routine  skin care checks   7. Fluids/Electrolytes/Nutrition: Routine Is and Os and follow-up chemistries             -vitamin B12 daily   8:    9: Hyperlipidemia: continue statin, Zetia, Repatha   10: DM: A1c = 9.2%             Continue insulin pump, discussed no more than 6U bolus to prevent hypoglycemia             -continue Jardiance 25 mg daily             -d/c SSI as  patient is on insulin pump  Advised stopping insulin between dinner and bedtime to prevent 3am hypoglycemia  Requested a [place] sandwich for HS snack, asked nursing to contact dietician as patient did nor receive HS snack last night CBG (last 3)  Recent Labs    11/15/23 0633 11/15/23 1725 11/16/23 0517  GLUCAP 98 232* 132*   On chronic prednisone as well - IM consulting    3/23 improving control--no changes today 11: 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 at home) - likely need to increase to 100 mg QHS_ having HA's 5-6/10 daily now   14: OSA on CPAP   15: History of breast cancer: continue tamoxifen   16: CKD stage II: reviewed and improved     18: Leukopenia: resolved, monitor weekly   19. Nausea: resolved  20. Right upper extremity pain: VAS [place] ordered to assess for clot, discussed that this was negative  21. Emesis: resolved  22. Chest pain/tachycardia: resolved neg V/Q scan, EKG neg  23. Hypokalemia: K+ improving, continue to encourage fruits and vegetables   24.  Mild DOE, no cough or fever, resp panel Neg, airborne prec d/ced  25.  Constipation:   Last BM 3/23, d/c colace  26. Worsening left sided facial droop: CT head ordered and is stable  27. Hypotension/Dizziness: d/c avapro, resolved  28. Anemia: Hgb reviewed and is stable,  monitor weekly.   29. HTN:  BP reviewed and is stable  30. Foot drop: discussed with PT potential AFO    LOS: 14 days A FACE TO FACE EVALUATION WAS PERFORMED  Drema Pry Ginny Loomer 11/16/2023, 9:23 AM

## 2023-11-16 NOTE — Inpatient Diabetes Management (Signed)
 Inpatient Diabetes Program Recommendations  AACE/ADA: New Consensus Statement on Inpatient Glycemic Control (2015)  Target Ranges:  Prepandial:   less than 140 mg/dL      Peak postprandial:   less than 180 mg/dL (1-2 hours)      Critically ill patients:  140 - 180 mg/dL   Lab Results  Component Value Date   GLUCAP 132 (H) 11/16/2023   HGBA1C 9.2 (H) 10/29/2023    Review of Glycemic Control  Latest Reference Range & Units 11/09/23 06:21 11/09/23 07:11 11/09/23 16:54 11/09/23 21:16 11/10/23 06:32  Glucose-Capillary 70 - 99 mg/dL 44 (LL) 098 (H) 119 (H) 303 (H) 90    Latest Reference Range & Units 11/10/23 06:32 11/10/23 11:20 11/12/23 05:38 11/13/23 06:27  Glucose-Capillary 70 - 99 mg/dL 90 147 (H) 829 (H) 562 (H)    Latest Reference Range & Units 11/13/23 06:27 11/14/23 06:21 11/15/23 06:01 11/15/23 06:33  Glucose-Capillary 70 - 99 mg/dL 130 (H) 865 (H) 53 (L) 98   Diabetes history: Type 2 DM  Outpatient Diabetes medications:  Tresiba 15 units daily , Jardiance 25 mg qd, Bydurian 2 mg qweek, omnipod insulin pump (using Humalog insulin)- last visible settings for insulin pump were as follows from 10/23/21:   Basal insulin  0000-0400       1.0 units/hour  0401-1300       1.4 units/hour  1301-1800       1.65 units/hour  1801-0000       1.45 units/hour  Total daily basal insulin: 33.55 units/24 hours  Current orders for Inpatient glycemic control: Lantus 40 units Novolog 0-15 units tid + hs Novolog 8 units tid meal coverage Note: pt also using insulin pump at this time Jardiance 25 mg QD  Prednisone 7.5 mg every day   Inpatient Diabetes Program Recommendations:    Awaiting discussion and decision of pt and attending regarding use of insulin pump while here.  Thanks, Christena Deem RN, MSN, BC-ADM Inpatient Diabetes Coordinator Team Pager [phone] (8a-5p)

## 2023-11-16 NOTE — Progress Notes (Signed)
 Occupational Therapy Session Note  Patient Details  Name: [name] MRN: [id number] Date of Birth: 12-28-1950  Today's Date: 11/16/2023 OT Individual Time: 4098-1191 & 4782-9562 OT Individual Time Calculation (min): 70 min & 71 min   Short Term Goals: Week 2:  OT Short Term Goal 1 (Week 2): Pt will donn UB clothing with supervision OT Short Term Goal 2 (Week 2): Pt will improve RUE AROM in pain free zone to 100 degrees shoulder flexion to assist with UB dressing OT Short Term Goal 3 (Week 2): Pt will complete toilet transfer using LRAD with supervision  Skilled Therapeutic Interventions/Progress Updates:  Session 1 Skilled OT intervention completed with focus on ADL retraining, tub/shower transfers, ambulatory transfers, [facility]. Pt received upright in bed, agreeable to session. No pain reported.   Transitioned to EOB with mod I, while lifting RLE with RUE. Completed all sit > stands and ambulatory transfers with RW and supervision during session.   Ambulated > toilet. Set up A for UB dressing including bra (after OT pre-fastened it for her). Distant supervision for toileting steps for continent urinary void. Ambulated > sink for standing hand hygiene and face washing with supervision. Transported pt dependently in w/c > ADL apartment.  Pt reports bathroom set up as tub/shower with curtain. Pt has TTB at home she uses at baseline. Pt was able to return demo of supervision transfer with RW from w/c <> TTB. Did need to use RUE to assist RLE into tub but no assist to do so. Demonstrated method of tucking shower curtain under buttocks to prevent water spillage in floor. Also advised pt to use rug with anti slip backing vs towel on tile floor to prevent falls.  Transported > gym. Stand pivot > EOM. Seated EOM, pt completed the following activities to address [facility] needed for BADLs, specifically bra fastening: -using RUE, pt assembled small pegs into pegboard then utilized tweezers to remove to  additionally promote FM strength; mild difficulty with strength aspect but no difficulty with coordination  -donning/doffing gloves and wiping pieces down  Transfer back to w/c, transported back to room. Pt remained seated in w/c, with chair alarm on/activated, and with all needs in reach at end of session.  Session 2 Skilled OT intervention completed with focus on activity tolerance, cardiovascular endurance, dynamic standing balance. Pt received seated in w/c, agreeable to session. No pain reported.  Pt declined self-care needs. Transported dependently in w/c <> gym. Completed all sit > stands and ambulatory transfers using RW with supervision during session.  Transferred > nustep. Pt completed the following intervals on nustep to promote global/cardiovascular endurance and RUE strength needed for independence with BADLs and functional mobility: -3 mins, level 6 -3 mins, level 6 -3 mins, level 6 Intermittent rest break needed for fatigue.  Transferred > EOM. Pt participated in the following dynamic standing balance and endurance tasks to promote independence and safety during BADLs and functional mobility: -cornhole toss activity. Initial CGA sit > stand without AD and CGA during task for first trial. Task upgraded to balancing on airex pad, requiring min A via HHA to step up on pad and intermittent min A for balance due to posterior bias during 2nd trial. Seated rest needed for fatigue -placing and retrieving cones from top of long mirror. CGA sit > stand without AD, Min A via HHA to step up onto airex pad, and min A needed for dynamic standing balance, using LUE to place due to RUE ROM deficits. Only able to successfully  place 3 cones on top of mirror without tipping them over  Transferred > w/c > EOB in room. Doffed shirt and bra without assist and donned new shirt. Transitioned with supervision sit > supine with pt able to advance RLE without use of RUE this session. Pt remained semi  upright in bed, with bed alarm on/activated, and with all needs in reach at end of session.   Therapy Documentation Precautions:  Precautions Precautions: Fall Precaution/Restrictions Comments: R hemiparesis Restrictions Weight Bearing Restrictions Per Provider Order: No    Therapy/Group: Individual Therapy  Melvyn Novas, MS, OTR/L  11/16/2023, 3:38 PM

## 2023-11-16 NOTE — Progress Notes (Signed)
 Patient ID: [name], female   DOB: Aug 18, 1951, 73 y.o.   MRN: [id number]   SW met with pt during PT session to review discharge. Pt understands she will not get RW as she had one in the past. Will discuss with her sister about purchasing a new one. Pt aware SW will follow-up with her sister.   1604- SW spoke with pt sister [name] to provide updates from team conference, review discharge, and inform on no DME and will need to purchase. SW provided contact info for nursing station in the event they are going to arrive after discharge time period.   Cecile Sheerer, MSW, LCSW Office: [phone] Cell: [phone] Fax: [phone]

## 2023-11-16 NOTE — Progress Notes (Signed)
 Occupational Therapy Discharge Summary  Patient Details  Name: [name] MRN: [id number] Date of Birth: 18-Jun-1951  Date of Discharge from OT service:November 17, 2023   Patient has met 9 of 9 long term goals due to improved activity tolerance, improved balance, ability to compensate for deficits, functional use of  RIGHT upper and RIGHT lower extremity, and improved coordination.  Patient to discharge at overall Modified Independent level.  Patient's care partner is independent to provide the necessary physical assistance at discharge. Pt's sisters plan to accompany pt at DC for first several days/weeks and attended education regarding functional transfer and ADL recommendations.   All goals met  Recommendation:  Patient will benefit from ongoing skilled OT services in outpatient setting to continue to advance functional skills in the area of BADL, iADL, and restore RUE functional use .  Equipment: No equipment provided  Reasons for discharge: treatment goals met  Patient/family agrees with progress made and goals achieved: Yes  OT Discharge Precautions/Restrictions  Precautions Precautions: Fall Precaution/Restrictions Comments: R hemiparesis Restrictions Weight Bearing Restrictions Per Provider Order: No ADL ADL Eating: Modified independent Where Assessed-Eating: Wheelchair Grooming: Modified independent Where Assessed-Grooming: Sitting at sink Upper Body Bathing: Modified independent Where Assessed-Upper Body Bathing: Sitting at sink Lower Body Bathing: Modified independent Where Assessed-Lower Body Bathing: Standing at sink Upper Body Dressing: Modified independent (Device) Where Assessed-Upper Body Dressing: Sitting at sink Lower Body Dressing: Modified independent Where Assessed-Lower Body Dressing: Sitting at sink, Standing at sink Toileting: Modified independent Where Assessed-Toileting: Neurosurgeon Method:  Proofreader: Grab bars, Raised toilet seat, Other (comment) (RW) Tub/Shower Transfer: Modified independent Web designer Method: Ship broker: Insurance underwriter: Close supervision Film/video editor Method: Designer, industrial/product: Emergency planning/management officer, Grab bars Vision Baseline Vision/History: 1 Wears glasses Patient Visual Report: Blurring of vision;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) Vision Assessment?: No apparent visual deficits Perception  Perception: Within Functional Limits Praxis Praxis: WFL Cognition Cognition Overall Cognitive Status: Within Functional Limits for tasks assessed Arousal/Alertness: Awake/alert Orientation Level: Person;Place;Situation Person: Oriented Place: Oriented Situation: Oriented Memory: Appears intact Awareness: Appears intact Problem Solving: Appears intact Safety/Judgment: Appears intact Brief Interview for Mental Status (BIMS) Repetition of Three Words (First Attempt): 3 Temporal Orientation: Year: Correct Temporal Orientation: Month: Accurate within 5 days Temporal Orientation: Day: Correct Recall: "Sock": Yes, no cue required Recall: "Blue": Yes, no cue required Recall: "Bed": Yes, no cue required BIMS Summary Score: 15 Sensation Sensation Light Touch: Impaired Detail Light Touch Impaired Details: Impaired RLE Hot/Cold: Not tested Proprioception: Appears Intact Stereognosis: Not tested Additional Comments: decreased sensation along RLE with tingling in R lower leg and foot. Coordination Gross Motor Movements are Fluid and Coordinated: No Fine Motor Movements are Fluid and Coordinated: Yes Coordination and Movement Description: grossly uncoordinated due to R hemiparesis with visual impairments Finger Nose Finger Test: slow Heel Shin Test: unable to perform on RLE, slow and decresaed ROM on LLE Motor  Motor Motor: Hemiplegia Motor -  Skilled Clinical Observations: R hemiparesis with visual impairments Mobility  Bed Mobility Bed Mobility: Rolling Right;Rolling Left;Sit to Supine;Supine to Sit Rolling Right: Independent with assistive device Rolling Left: Independent with assistive device Supine to Sit: Contact Guard/Touching assist Sit to Supine: Minimal Assistance - Patient > 75% Transfers Sit to Stand: Independent with assistive device Stand to Sit: Independent with assistive device  Trunk/Postural Assessment  Cervical Assessment Cervical Assessment: Exceptions to [facility] (forward head) Thoracic Assessment Thoracic Assessment:  Exceptions to [facility] (rounded shoulders) Lumbar Assessment Lumbar Assessment: Exceptions to [facility] (posterior pelvic tilt) Postural Control Postural Control: Deficits on evaluation Righting Reactions: delayed on R Protective Responses: delayed on R  Balance Balance Balance Assessed: Yes Static Sitting Balance Static Sitting - Balance Support: Feet supported;No upper extremity supported Static Sitting - Level of Assistance: 7: Independent Dynamic Sitting Balance Dynamic Sitting - Balance Support: Feet supported;No upper extremity supported Dynamic Sitting - Level of Assistance: 6: Modified independent (Device/Increase time) Static Standing Balance Static Standing - Balance Support: Bilateral upper extremity supported;During functional activity (RW) Static Standing - Level of Assistance: 6: Modified independent (Device/Increase time) Dynamic Standing Balance Dynamic Standing - Balance Support: Bilateral upper extremity supported;During functional activity (RW) Dynamic Standing - Level of Assistance: 6: Modified independent (Device/Increase time) Dynamic Standing - Comments: with transfers and gait Extremity/Trunk Assessment RUE Assessment RUE Assessment: Exceptions to [facility] Active Range of Motion (AROM) Comments: Improved to about 110 degrees shoulder flexion General Strength Comments: 3-/5  proximally, WFL distally LUE Assessment LUE Assessment: Within Functional Limits   Velera Lansdale E Keiji Melland, MS, OTR/L  11/16/2023, 3:39 PM

## 2023-11-17 ENCOUNTER — Other Ambulatory Visit (HOSPITAL_COMMUNITY): Payer: Self-pay

## 2023-11-17 ENCOUNTER — Encounter: Payer: Self-pay | Admitting: Hematology

## 2023-11-17 DIAGNOSIS — I6350 Cerebral infarction due to unspecified occlusion or stenosis of unspecified cerebral artery: Secondary | ICD-10-CM | POA: Diagnosis not present

## 2023-11-17 LAB — GLUCOSE, CAPILLARY
Glucose-Capillary: 262 mg/dL — ABNORMAL HIGH (ref 70–99)
Glucose-Capillary: 169 mg/dL — ABNORMAL HIGH (ref 70–99)
Glucose-Capillary: 216 mg/dL — ABNORMAL HIGH (ref 70–99)
Glucose-Capillary: 219 mg/dL — ABNORMAL HIGH (ref 70–99)
Glucose-Capillary: 287 mg/dL — ABNORMAL HIGH (ref 70–99)

## 2023-11-17 MED ORDER — PSYLLIUM 95 % PO PACK
1.0000 | PACK | Freq: Every day | ORAL | 0 refills | Status: DC
  Filled 2023-11-17: qty 30, 30d supply, fill #0

## 2023-11-17 MED ORDER — CLOPIDOGREL BISULFATE 75 MG PO TABS
75.0000 mg | ORAL_TABLET | Freq: Every day | ORAL | 1 refills | Status: DC
  Filled 2023-11-17: qty 30, 30d supply, fill #0

## 2023-11-17 MED ORDER — ACETAMINOPHEN 325 MG PO TABS: 325.0000 mg | ORAL_TABLET | ORAL | Status: DC | PRN

## 2023-11-17 MED ORDER — DOCUSATE SODIUM 100 MG PO CAPS
100.0000 mg | ORAL_CAPSULE | Freq: Every day | ORAL | 0 refills | Status: DC
  Filled 2023-11-17: qty 30, 30d supply, fill #0

## 2023-11-17 MED ORDER — ASPIRIN 81 MG PO TBEC
81.0000 mg | DELAYED_RELEASE_TABLET | Freq: Every day | ORAL | 0 refills | Status: AC
  Filled 2023-11-17: qty 2, 2d supply, fill #0

## 2023-11-17 MED ORDER — PANTOPRAZOLE SODIUM 40 MG PO TBEC
40.0000 mg | DELAYED_RELEASE_TABLET | Freq: Two times a day (BID) | ORAL | 0 refills | Status: AC
  Filled 2023-11-17: qty 60, 30d supply, fill #0

## 2023-11-17 MED ORDER — VITAMIN D3 25 MCG PO TABS
1000.0000 [IU] | ORAL_TABLET | Freq: Every day | ORAL | 0 refills | Status: DC
  Filled 2023-11-17: qty 30, 30d supply, fill #0

## 2023-11-17 NOTE — Progress Notes (Signed)
 Physical Therapy Discharge Summary  Patient Details  Name: [name] MRN: [id number] Date of Birth: 06/18/1951  Date of Discharge from PT service:November 17, 2023  Patient has met 6 of 7 long term goals due to improved activity tolerance, improved balance, improved postural control, increased strength, ability to compensate for deficits, improved awareness, and improved coordination.  Patient to discharge at an ambulatory level Modified Independent using RW. Patient's care partner is independent to provide the necessary physical assistance at discharge. Pt's sisters attended family education training on 3/25 and verbalized and demonstrated confidence with tasks to ensure safe discharge home.   Reasons goals not met: Pt did not meet bed mobility goal of supervision as pt currently requires CGA for supine<>sit and light min A for sit<>supine. Anticipate pt could perform without assist, but pt insists on assist for RLE management due to "weakness".   Recommendation:  Patient will benefit from ongoing skilled PT services in outpatient setting to continue to advance safe functional mobility, address ongoing impairments in transfers, generalized strengthening and endurance, dynamic standing balance/coordination, NMR, gait training, and to minimize fall risk.  Equipment: RW  Reasons for discharge: treatment goals met  Patient/family agrees with progress made and goals achieved: Yes  PT Discharge Precautions/Restrictions Precautions Precautions: Fall Precaution/Restrictions Comments: R hemiparesis Restrictions Weight Bearing Restrictions Per Provider Order: No Pain Interference Pain Interference Pain Effect on Sleep: 1. Rarely or not at all Pain Interference with Therapy Activities: 1. Rarely or not at all Pain Interference with Day-to-Day Activities: 1. Rarely or not at all Cognition Overall Cognitive Status: Within Functional Limits for tasks assessed Arousal/Alertness:  Awake/alert Orientation Level: Oriented X4 Memory: Appears intact Awareness: Appears intact Problem Solving: Appears intact Safety/Judgment: Appears intact Sensation Sensation Light Touch: Impaired Detail Light Touch Impaired Details: Impaired RLE Hot/Cold: Not tested Proprioception: Appears Intact Stereognosis: Not tested Additional Comments: decreased sensation along RLE with tingling in R lower leg and foot. Coordination Gross Motor Movements are Fluid and Coordinated: No Fine Motor Movements are Fluid and Coordinated: Yes Coordination and Movement Description: grossly uncoordinated due to R hemiparesis with visual impairments Finger Nose Finger Test: slow Heel Shin Test: unable to perform on RLE, slow and decresaed ROM on LLE Motor  Motor Motor: Hemiplegia Motor - Skilled Clinical Observations: R hemiparesis with visual impairments  Mobility Bed Mobility Bed Mobility: Rolling Right;Rolling Left;Sit to Supine;Supine to Sit Rolling Right: Independent with assistive device Rolling Left: Independent with assistive device Supine to Sit: Contact Guard/Touching assist Sit to Supine: Minimal Assistance - Patient > 75% Transfers Transfers: Sit to Stand;Stand to Sit;Stand Pivot Transfers Sit to Stand: Independent with assistive device Stand to Sit: Independent with assistive device Stand Pivot Transfers: Independent with assistive device Transfer (Assistive device): Rolling walker Locomotion  Gait Ambulation: Yes Gait Assistance: Independent with assistive device Gait Distance (Feet): 180 Feet Assistive device: Rolling walker Gait Gait: Yes Gait Pattern: Impaired Gait Pattern: Step-through pattern;Decreased step length - right;Decreased stride length;Decreased dorsiflexion - right;Poor foot clearance - right Gait velocity: decreased Stairs / Additional Locomotion Stairs: Yes Stairs Assistance: Minimal Assistance - Patient > 75% Stair Management Technique: Two rails;Step  to pattern Number of Stairs: 8 Height of Stairs: 3 Ramp: Supervision/Verbal cueing (RW) Pick up small object from the floor assist level: Independent with assistive device Pick up small object from the floor assistive device: reacher and RW Wheelchair Mobility Wheelchair Mobility: No  Trunk/Postural Assessment  Cervical Assessment Cervical Assessment: Exceptions to [facility] (forward head) Thoracic Assessment Thoracic Assessment: Exceptions to [facility] (  rounded shoulders) Lumbar Assessment Lumbar Assessment: Exceptions to [facility] (posterior pelvic tilt) Postural Control Postural Control: Deficits on evaluation Righting Reactions: delayed on R Protective Responses: delayed on R  Balance Balance Balance Assessed: Yes Static Sitting Balance Static Sitting - Balance Support: Feet supported;No upper extremity supported Static Sitting - Level of Assistance: 7: Independent Dynamic Sitting Balance Dynamic Sitting - Balance Support: Feet supported;No upper extremity supported Dynamic Sitting - Level of Assistance: 6: Modified independent (Device/Increase time) Static Standing Balance Static Standing - Balance Support: Bilateral upper extremity supported;During functional activity (RW) Static Standing - Level of Assistance: 6: Modified independent (Device/Increase time) Dynamic Standing Balance Dynamic Standing - Balance Support: Bilateral upper extremity supported;During functional activity (RW) Dynamic Standing - Level of Assistance: 6: Modified independent (Device/Increase time) Dynamic Standing - Comments: with transfers and gait Extremity Assessment  RLE Assessment RLE Assessment: Exceptions to [facility] General Strength Comments: tested sitting in WC RLE Strength Right Hip Flexion: 2+/5 Right Hip ABduction: 3/5 Right Hip ADduction: 3/5 Right Knee Flexion: 2+/5 Right Knee Extension: 2+/5 Right Ankle Dorsiflexion: 2+/5 Right Ankle Plantar Flexion: 2/5 LLE Assessment LLE Assessment: Exceptions to  [facility] General Strength Comments: tested sitting in WC LLE Strength Left Hip Flexion: 4-/5 Left Hip ABduction: 4-/5 Left Hip ADduction: 4-/5 Left Knee Flexion: 4/5 Left Knee Extension: 4/5 Left Ankle Dorsiflexion: 4/5 Left Ankle Plantar Flexion: 4/5   Marlana Salvage Zaunegger Blima Rich PT, DPT 11/17/2023, 7:05 AM

## 2023-11-17 NOTE — Plan of Care (Signed)
  Problem: RH Wheelchair Mobility Goal: LTG Patient will propel w/c in controlled environment (PT) Description: LTG: Patient will propel wheelchair in controlled environment, # of feet with assist (PT) Outcome: Not Applicable Flowsheets (Taken 11/17/2023 1155) LTG: Pt will propel w/c in controlled environ  assist needed:: (D/C) -- Note: D/C Goal: LTG Patient will propel w/c in home environment (PT) Description: LTG: Patient will propel wheelchair in home environment, # of feet with assistance (PT). Outcome: Not Applicable Flowsheets (Taken 11/17/2023 1155) LTG: Pt will propel w/c in home environ  assist needed:: (D/C) -- Note: D/C   Problem: RH Stairs Goal: LTG Patient will ambulate up and down stairs w/assist (PT) Description: LTG: Patient will ambulate up and down # of stairs with assistance (PT) Outcome: Not Applicable Flowsheets (Taken 11/17/2023 0706) LTG: Pt will ambulate up/down stairs assist needed:: (D/C) -- Note: D/C

## 2023-11-17 NOTE — Progress Notes (Signed)
 PROGRESS NOTE   Subjective/Complaints: CBG trend in chart and is well controlled Colace scheduled for constipation, last BM 3/25 Patient's chart reviewed- No issues reported overnight Vitals signs stable   ROS: +constipation- stable  Objective:   No results found.   Recent Labs    11/15/23 0424  WBC 8.1  HGB 8.7*  HCT 26.8*  PLT 232    Recent Labs    11/15/23 0424  NA 142  K 3.4*  CL 115*  CO2 23  GLUCOSE 116*  BUN 18  CREATININE 0.98  CALCIUM 8.2*    Intake/Output Summary (Last 24 hours) at 11/17/2023 0905 Last data filed at 11/17/2023 0745 Gross per 24 hour  Intake 960 ml  Output --  Net 960 ml         Physical Exam: Vital Signs Blood pressure 119/68, pulse 82, temperature 98.3 F (36.8 C), temperature source Oral, resp. rate 16, height 5\' 3"  (1.6 m), weight 86.2 kg, SpO2 97%.  Constitutional: No distress . Vital signs reviewed. HEENT: NCAT, EOMI, oral membranes moist Neck: supple Cardiovascular: RRR without murmur. No JVD    Respiratory/Chest: CTA Bilaterally without wheezes or rales. Normal effort    GI/Abdomen: BS +, non-tender, non-distended Ext: no clubbing, cyanosis, or edema Psych: pleasant and cooperative  Skin: No evidence of breakdown, no evidence of rash Musculoskeletal:        General: Normal range of motion.     Cervical back: Neck supple. No tenderness.     Comments:  RUE- 3-/5  LUE- 5/5  RLE- 3-/5  LLE-5/5, stable 3/26  Neurological:     Mental Status: She is alert and oriented to person, place, and time.  Reduced sensation LT on right side including face  Bilateral mild ptosis    Assessment/Plan: 1. Functional deficits which require 3+ hours per day of interdisciplinary therapy in a comprehensive inpatient rehab setting. Physiatrist is providing close team supervision and 24 hour management of active medical problems listed below. Physiatrist and rehab team  continue to assess barriers to discharge/monitor patient progress toward functional and medical goals  Care Tool:  Bathing    Body parts bathed by patient: Right arm, Left arm, Chest, Abdomen, Right upper leg, Left upper leg, Face, Right lower leg, Left lower leg, Front perineal area, Buttocks   Body parts bathed by helper: Front perineal area, Buttocks     Bathing assist Assist Level: Independent with assistive device     Upper Body Dressing/Undressing Upper body dressing   What is the patient wearing?: Pull over shirt, Bra    Upper body assist Assist Level: Independent with assistive device Assistive Device Comment: able to hook bra and spin around  Lower Body Dressing/Undressing Lower body dressing      What is the patient wearing?: Underwear/pull up, Pants     Lower body assist Assist for lower body dressing: Independent with assitive device     Toileting Toileting    Toileting assist Assist for toileting: Independent with assistive device     Transfers Chair/bed transfer  Transfers assist  Chair/bed transfer activity did not occur: Safety/medical concerns (nausea, lethargy)  Chair/bed transfer assist level: Supervision/Verbal cueing  Locomotion Ambulation   Ambulation assist   Ambulation activity did not occur: Safety/medical concerns (nausea, lethargy)  Assist level: Supervision/Verbal cueing Assistive device: Walker-rolling Max distance: 150   Walk 10 feet activity   Assist  Walk 10 feet activity did not occur: Safety/medical concerns (nausea, lethargy)  Assist level: Supervision/Verbal cueing Assistive device: Walker-rolling   Walk 50 feet activity   Assist Walk 50 feet with 2 turns activity did not occur: Safety/medical concerns (nausea, lethargy)  Assist level: Supervision/Verbal cueing Assistive device: Walker-rolling    Walk 150 feet activity   Assist Walk 150 feet activity did not occur: Safety/medical concerns (nausea,  lethargy)  Assist level: Supervision/Verbal cueing Assistive device: Walker-rolling    Walk 10 feet on uneven surface  activity   Assist Walk 10 feet on uneven surfaces activity did not occur: Safety/medical concerns (nausea, lethargy)   Assist level: Supervision/Verbal cueing Assistive device: Walker-rolling   Wheelchair     Assist Is the patient using a wheelchair?: Yes Type of Wheelchair: Manual Wheelchair activity did not occur: Safety/medical concerns (nausea, lethargy)         Wheelchair 50 feet with 2 turns activity    Assist    Wheelchair 50 feet with 2 turns activity did not occur: Safety/medical concerns (nausea, lethargy)       Wheelchair 150 feet activity     Assist  Wheelchair 150 feet activity did not occur: Safety/medical concerns (nausea, lethargy)       Blood pressure 119/68, pulse 82, temperature 98.3 F (36.8 C), temperature source Oral, resp. rate 16, height 5\' 3"  (1.6 m), weight 86.2 kg, SpO2 97%. Medical Problem List and Plan: 1. 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  Impaired mobility: d/c lovenox given abdominal bruising and since patient is ambulating >150 feet, continue SCDs             -antiplatelet therapy: Aspirin and Plavix for three weeks followed by Plavix alone (has ICA stent)   3. Diabetic peripheral neuropathy: Messaged Sherry to schedule for outpatient Qutenza. Tylenol as needed             -continue Lyrica 75 mg BID   4. Mood/Behavior/Sleep: LCSW to evaluate and provide emotional support             -antipsychotic agents: n/a   5. Neuropsych/cognition: This patient is capable of making decisions on her own behalf.   6.  Skin/Wound Care: Routine skin care checks   7. Fluids/Electrolytes/Nutrition: Routine Is and Os and follow-up chemistries             -vitamin B12 daily   8:    9: Hyperlipidemia: continue statin, Zetia, Repatha   10: DM: A1c = 9.2%             Continue insulin pump, discussed no more than 6U bolus to prevent hypoglycemia             -continue Jardiance 25 mg daily             -d/c SSI as patient is  on insulin pump  Advised stopping insulin between dinner and bedtime to prevent 3am hypoglycemia  Requested a [place] sandwich for HS snack, asked nursing to contact dietician as patient did nor receive HS snack last night CBG (last 3)  Recent Labs    11/16/23 2051 11/17/23 0301 11/17/23 0547  GLUCAP 217* 262* 169*   On chronic prednisone as well - IM consulting    3/23 improving control--no changes today 11: 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 Nausea: resolved  20. Right upper extremity pain: VAS [place] ordered to assess for clot, discussed that this was negative  21. Emesis: resolved  22. Chest pain/tachycardia: resolved neg V/Q scan, EKG neg  23. Hypokalemia: K+ improving, continue to encourage fruits and vegetables   24.  Mild DOE, no cough or fever, resp panel Neg, airborne prec d/ced  25.  Constipation:   Last BM 3/25, continue colace  26. Worsening left sided facial droop: CT head ordered and is stable  27. Hypotension/Dizziness: d/c avapro, resolved  28. Anemia: Hgb reviewed and is stable, monitor weekly.   29. HTN:  BP reviewed and is  stable  30. Foot drop: discussed with PT potential AFO    LOS: 15 days A FACE TO FACE EVALUATION WAS PERFORMED  Gonzalo Waymire P Serra Younan 11/17/2023, 9:05 AM

## 2023-11-17 NOTE — Progress Notes (Signed)
 Physical Therapy Session Note  Patient Details  Name: [name] MRN: [id number] Date of Birth: 11-02-1950  Today's Date: 11/17/2023 PT Individual Time: 4098-1191 and 1300-1355 PT Individual Time Calculation (min): 43 min and 55 min  Short Term Goals: Week 1:  PT Short Term Goal 1 (Week 1): pt will perform bed mobility with CGA overall PT Short Term Goal 1 - Progress (Week 1): Progressing toward goal PT Short Term Goal 2 (Week 1): pt will perform transfers with LRAD and CGA PT Short Term Goal 2 - Progress (Week 1): Met PT Short Term Goal 3 (Week 1): pt will ambulate 76ft with LRAD and CGA PT Short Term Goal 3 - Progress (Week 1): Met Week 2:  PT Short Term Goal 1 (Week 2): STG=LTG due to LOS  Skilled Therapeutic Interventions/Progress Updates:   Treatment Session 1 Received pt sitting in WC, pt agreeable to PT treatment, and reported pain in R foot and headache - pt declined any pain medication. Session with emphasis on discharge planning, functional mobility/transfers, generalized strengthening and endurance, dynamic standing balance/coordination, simulated car transfers, and gait training. Went through sensation, MMT, and pain interference questionnaire in preparation for discharge. Pt performed all transfers with RW and mod I throughout session.   Discussed R foot up brace vs R toe cap with plan for [employer] rep to come by this afternoon to assess further. Pt transported to/from room in [facility] dependently for time management purposes. Pt performed simulated car transfer with RW and supervision, then ambulated 59ft on uneven surfaces (ramp) with RW and supervision - demo 1 LOB upon stepping off ramp, requiring min A to correct. Pt declined practicing stair navigation this morning due to fatigue. Pt able to stand and pick up object from floor with RW and reacher mod I. Pt then ambulated 111ft with RW and mod I - pt with delayed onset R foot drag with foot up brace, reporting it helps "some".  Returned to room and pt reported urge to void. Ambulated into bathroom with RW and mod I and pt able to manage clothing mod I. Concluded session with pt sitting on toilet left in care of NT due to time restrictions.   Treatment Session 2  Received pt sitting EOB, pt agreeable to PT treatment, and did not state pain level during session. Olivia from [place] present to discuss R toe cap for shoe. Session with emphasis on functional mobility/transfers, generalized strengthening and endurance, dynamic standing balance/coordination, and gait training. Donned regular shoes with max A and pt performed all transfers with RW and mod I throughout session. Pt ambulated in/out of bathroom with RW and mod I. Pt able to perform all toileting tasks mod I, then stood at sink to perform hygiene management mod I.   Pt transported to/from room in [facility] dependently for time management purposes. Pt ambulated ~51ft with RW and mod I - discussed R toe drag and getting toe cap vs foot up brace as pt has difficulty donning foot up brace without assist and toe cap would be less restrictive. Pt verbalized understanding and in agreement with recommendation. Removed shoes (for Olivia to take) and donned non-skid socks. Pt performed seated BUE/BLE strengthening on Nustep at workload 4 for 6 minutes for a total of 210 steps with emphasis on cardiovascular endurance, reciprocal movement training, and RUE/RLE NMR. Returned to room and concluded session with pt sitting in [facility] with all needs within reach.   Therapy Documentation Precautions:  Precautions Precautions: Fall Precaution/Restrictions Comments: R hemiparesis  Restrictions Weight Bearing Restrictions Per Provider Order: No  Therapy/Group: Individual Therapy Marlana Salvage Zaunegger Blima Rich PT, DPT 11/17/2023, 6:50 AM

## 2023-11-17 NOTE — Plan of Care (Signed)
  Problem: RH Balance Goal: LTG Patient will maintain dynamic standing with ADLs (OT) Description: LTG:  Patient will maintain dynamic standing balance with assist during activities of daily living (OT)  Outcome: Completed/Met   Problem: Sit to Stand Goal: LTG:  Patient will perform sit to stand in prep for activites of daily living with assistance level (OT) Description: LTG:  Patient will perform sit to stand in prep for activites of daily living with assistance level (OT) Outcome: Completed/Met   Problem: RH Bathing Goal: LTG Patient will bathe all body parts with assist levels (OT) Description: LTG: Patient will bathe all body parts with assist levels (OT) Outcome: Completed/Met   Problem: RH Dressing Goal: LTG Patient will perform upper body dressing (OT) Description: LTG Patient will perform upper body dressing with assist, with/without cues (OT). Outcome: Completed/Met Goal: LTG Patient will perform lower body dressing w/assist (OT) Description: LTG: Patient will perform lower body dressing with assist, with/without cues in positioning using equipment (OT) Outcome: Completed/Met   Problem: RH Toileting Goal: LTG Patient will perform toileting task (3/3 steps) with assistance level (OT) Description: LTG: Patient will perform toileting task (3/3 steps) with assistance level (OT)  Outcome: Completed/Met   Problem: RH Simple Meal Prep Goal: LTG Patient will perform simple meal prep w/assist (OT) Description: LTG: Patient will perform simple meal prep with assistance, with/without cues (OT). Outcome: Completed/Met   Problem: RH Toilet Transfers Goal: LTG Patient will perform toilet transfers w/assist (OT) Description: LTG: Patient will perform toilet transfers with assist, with/without cues using equipment (OT) Outcome: Completed/Met   Problem: RH Tub/Shower Transfers Goal: LTG Patient will perform tub/shower transfers w/assist (OT) Description: LTG: Patient will perform  tub/shower transfers with assist, with/without cues using equipment (OT) Outcome: Completed/Met   

## 2023-11-17 NOTE — Progress Notes (Signed)
 Occupational Therapy Session Note  Patient Details  Name: [name] MRN: [id number] Date of Birth: 1951-03-15  Today's Date: 11/17/2023 OT Individual Time: 6440-3474 & 1445-1530 OT Individual Time Calculation (min): 71 min & 45 min   Short Term Goals: Week 2:  OT Short Term Goal 1 (Week 2): Pt will donn UB clothing with supervision OT Short Term Goal 2 (Week 2): Pt will improve RUE AROM in pain free zone to 100 degrees shoulder flexion to assist with UB dressing OT Short Term Goal 3 (Week 2): Pt will complete toilet transfer using LRAD with supervision  Skilled Therapeutic Interventions/Progress Updates:  Session 1 Skilled OT intervention completed with focus on ADL retraining, functional endurance, and mobility within a shower context. Pt received seated EOB, agreeable to session. No pain reported. Agreeable to shower in prep for DC.  Pt completed all sit > stands and ambulatory transfers with mod I while using RW.   Waterproof cover applied to RUQ port prior to shower per MD order. Pt was able to bathe all parts with mod I with no LOB or difficulty with being at standing level using grab bars for balance. Got dressed on EOB. Able to donn shirt/deo, thread LB clothing with Mod I with increased time for donning socks but no assist. Required assist for donning RLE toe up brace. Nurse present for meds.  Pt ambulated 100 ft, without LOB in hallway to address functional ambulatory endurance. Pt remained seated in w/c, with chair alarm on/activated, and with all needs in reach at end of session.  Session 2 Skilled OT intervention completed with focus on w/c mobility, IADL management, dynamic standing balance and RUE NMR. Pt received seated in w/c, agreeable to session. No pain reported.  Pt verbalized fatigue but with enticement to go downstairs for change of scenery pt agreeable. Transported dependently in w/c > gift shop. OT tasked pt with locating 3 items (self-care item, a gift for  someone, and a food item) inside the gift shop at w/c level. Pt self-propelled in w/c about 30 ft total with BUE with mod I. Pt with increased fatigue on RUE requiring intermittent rest breaks. No assist to remember list. Discussed how the activity relates to other IADL tasks such as grocery shopping at the community level.  Transported > [employer]. Pt participated in the following dynamic standing balance and endurance tasks to promote independence and safety during BADLs and functional mobility: -placing and retrieving squigz from window above shoulder height. CGA sit > stand without AD, and close supervision needed for balance. Fatigue with RUE proximally   Back in room, pt completed mod I stand pivot with RW > EOB. Doffed clothing and changed into pjs with mod I. Transitioned sit > semi upright with min A for RLE for time conservation. Pt remained upright in bed, with bed alarm on/activated, and with all needs in reach at end of session.   Therapy Documentation Precautions:  Precautions Precautions: Fall Precaution/Restrictions Comments: R hemiparesis Restrictions Weight Bearing Restrictions Per Provider Order: No    Therapy/Group: Individual Therapy  Melvyn Novas, MS, OTR/L  11/17/2023, 3:39 PM

## 2023-11-17 NOTE — Patient Care Conference (Signed)
 Inpatient RehabilitationTeam Conference and Plan of Care Update Date: 11/17/2023   Time: 11:36 AM    Patient Name: [name]      Medical Record Number: [id number]  Date of Birth: 23-May-1951 Sex: Female         Room/Bed: 4M07C/4M07C-01 Payor Info: Payor: Advertising copywriter MEDICARE / Plan: [facility] MEDICARE / Product Type: *No Product type* /    Admit Date/Time:  11/02/2023  2:52 PM  Primary Diagnosis:  Right pontine stroke [facility])  Hospital Problems: Principal Problem:   Right pontine stroke (HCC) Active Problems:   Myasthenia gravis (HCC)   Uncontrolled type 2 diabetes mellitus with hyperglycemia (HCC)   Nausea and vomiting   Leukocytosis   SIRS (systemic inflammatory response syndrome) (HCC)    Expected Discharge Date: Expected Discharge Date: 11/18/23  Team Members Present: Physician leading conference: Dr. Sula Soda Social Worker Present: Dossie Der, LCSW Nurse Present: Chana Bode, RN PT Present: Blima Rich, PT OT Present: Candee Furbish, OT SLP Present: Other (comment) Alvera Novel, SLP) PPS Coordinator present : Fae Pippin, SLP     Current Status/Progress Goal Weekly Team Focus  Bowel/Bladder   Continent of b/b   maintain continence   assist with toileting as needed    Swallow/Nutrition/ Hydration               ADL's   Supervision approaching goal level   Mod I   Barriers- R hemiparesis, cardiovascular endurance, R shoulder pain, FMC, difficult to manage diabetes    Mobility   supervision overall bed with leg lifter, transfers, gait >150' RW, min A steps for balance   Mod I/ Supervision stairs  R LE balance/strength training    Communication                Safety/Cognition/ Behavioral Observations               Pain   No c/o of pain   pain <3/10   assess qshift and prn    Skin   Skin intact, generalized bruising on abdomen   Maintain skin integrity  assess qshift and prn      Discharge Planning:  Pt will d/c to home  with support from her sister [name] who lives accross the street, and other siblings as well to assist. fam edu completed Mon 1pm-3pm with her sisters.  Outpatient PT/OT at [facility]. SW will confirm there are no barriers to discharge.   Team Discussion: Patient post right pontine CVA with poor cardio endurance and right shoulder pain with poor coordination.    Patient on target to meet rehab goals: yes, currently needs supervision - light min assist for ADLs. Needs mod I for transfers and able to ambulate > 100' and manage steps with CGA - min assist  *See Care Plan and progress notes for long and short-term goals.   Revisions to Treatment Plan:  Foot up/toe cap for shoe   Teaching Needs: Safety, medications, transfers, toileting, etc.   Current Barriers to Discharge: Decreased caregiver support and Home enviroment access/layout  Possible Resolutions to Barriers: Family education 11/16/23 OP follow up services DME: RW     Medical Summary Current Status: uncontrolled type 2 diabetes with hyper and hypoglycemia, hypertension with orthostatic hypotension, hypokalemia  Barriers to Discharge: Medical stability  Barriers to Discharge Comments: uncontrolled type 2 diabetes with hyper and hypoglycemia, hypertension with orthostatic hypotension, hypokalemia Possible Resolutions to [employer] Focus: continue insulin pump, advised to bolus no more than 6U to prevent  hypoglycemia, diabetes coordinator consulted, continue fruit   Continued Need for Acute Rehabilitation Level of Care: The patient requires daily medical management by a physician with specialized training in physical medicine and rehabilitation for the following reasons: Direction of a multidisciplinary physical rehabilitation program to maximize functional independence : Yes Medical management of patient stability for increased activity during participation in an intensive rehabilitation regime.: Yes Analysis of  laboratory values and/or radiology reports with any subsequent need for medication adjustment and/or medical intervention. : Yes   I attest that I was present, lead the team conference, and concur with the assessment and plan of the team.   Pamelia Hoit 11/17/2023, 3:42 PM

## 2023-11-17 NOTE — Plan of Care (Signed)
  Problem: RH Stairs Goal: LTG Patient will ambulate up and down stairs w/assist (PT) Description: LTG: Patient will ambulate up and down # of stairs with assistance (PT) Outcome: Not Applicable Flowsheets (Taken 11/17/2023 0706) LTG: Pt will ambulate up/down stairs assist needed:: (D/C) -- Note: D/C

## 2023-11-18 ENCOUNTER — Other Ambulatory Visit (HOSPITAL_COMMUNITY): Payer: Self-pay

## 2023-11-18 DIAGNOSIS — I6350 Cerebral infarction due to unspecified occlusion or stenosis of unspecified cerebral artery: Secondary | ICD-10-CM | POA: Diagnosis not present

## 2023-11-18 LAB — GLUCOSE, CAPILLARY
Glucose-Capillary: 88 mg/dL (ref 70–99)
Glucose-Capillary: 170 mg/dL — ABNORMAL HIGH (ref 70–99)
Glucose-Capillary: 214 mg/dL — ABNORMAL HIGH (ref 70–99)

## 2023-11-18 MED ORDER — HEPARIN SOD (PORK) LOCK FLUSH 100 UNIT/ML IV SOLN
500.0000 [IU] | INTRAVENOUS | Status: AC | PRN
  Administered 2023-11-18: 500 [IU]
  Filled 2023-11-18: qty 5

## 2023-11-18 NOTE — Progress Notes (Signed)
 PROGRESS NOTE   Subjective/Complaints: Mrs. Rainford has no complaints/concerns this morning She is excited for outpatient Qutenza Discussed toe cap ordered  ROS: +constipation- stable, +weakness  Objective:   No results found.   No results for input(s): "WBC", "HGB", "HCT", "PLT" in the last 72 hours.   No results for input(s): "NA", "K", "CL", "CO2", "GLUCOSE", "BUN", "CREATININE", "CALCIUM" in the last 72 hours.   Intake/Output Summary (Last 24 hours) at 11/18/2023 1004 Last data filed at 11/18/2023 0731 Gross per 24 hour  Intake 718 ml  Output --  Net 718 ml         Physical Exam: Vital Signs Blood pressure (!) 148/57, pulse 82, temperature 97.8 F (36.6 C), resp. rate 18, height 5\' 3"  (1.6 m), weight 86.2 kg, SpO2 100%.  Constitutional: No distress . Vital signs reviewed. HEENT: NCAT, EOMI, oral membranes moist Neck: supple Cardiovascular: RRR without murmur. No JVD    Respiratory/Chest: CTA Bilaterally without wheezes or rales. Normal effort    GI/Abdomen: BS +, non-tender, non-distended Ext: no clubbing, cyanosis, or edema Psych: pleasant and cooperative  Skin: No evidence of breakdown, no evidence of rash Musculoskeletal:        General: Normal range of motion.     Cervical back: Neck supple. No tenderness.     Comments:  RUE- 3-/5  LUE- 5/5  RLE- 3-/5  LLE-5/5, stable 3/27  Neurological:     Mental Status: She is alert and oriented to person, place, and time.  Reduced sensation LT on right side including face  Bilateral mild ptosis    Assessment/Plan: 1. Functional deficits which require 3+ hours per day of interdisciplinary therapy in a comprehensive inpatient rehab setting. Physiatrist is providing close team supervision and 24 hour management of active medical problems listed below. Physiatrist and rehab team continue to assess barriers to discharge/monitor patient progress toward  functional and medical goals  Care Tool:  Bathing    Body parts bathed by patient: Right arm, Left arm, Chest, Abdomen, Right upper leg, Left upper leg, Face, Right lower leg, Left lower leg, Front perineal area, Buttocks   Body parts bathed by helper: Front perineal area, Buttocks     Bathing assist Assist Level: Independent with assistive device     Upper Body Dressing/Undressing Upper body dressing   What is the patient wearing?: Pull over shirt, Bra    Upper body assist Assist Level: Independent with assistive device Assistive Device Comment: able to hook bra and spin around  Lower Body Dressing/Undressing Lower body dressing      What is the patient wearing?: Underwear/pull up, Pants     Lower body assist Assist for lower body dressing: Independent with assitive device     Toileting Toileting    Toileting assist Assist for toileting: Independent with assistive device     Transfers Chair/bed transfer  Transfers assist  Chair/bed transfer activity did not occur: Safety/medical concerns (nausea, lethargy)  Chair/bed transfer assist level: Independent with assistive device Chair/bed transfer assistive device: Geologist, engineering   Ambulation assist   Ambulation activity did not occur: Safety/medical concerns (nausea, lethargy)  Assist level: Independent with assistive device Assistive device: Walker-rolling  Max distance: 144ft   Walk 10 feet activity   Assist  Walk 10 feet activity did not occur: Safety/medical concerns (nausea, lethargy)  Assist level: Independent with assistive device Assistive device: Walker-rolling   Walk 50 feet activity   Assist Walk 50 feet with 2 turns activity did not occur: Safety/medical concerns (nausea, lethargy)  Assist level: Independent with assistive device Assistive device: Walker-rolling    Walk 150 feet activity   Assist Walk 150 feet activity did not occur: Safety/medical concerns (nausea,  lethargy)  Assist level: Independent with assistive device Assistive device: Walker-rolling    Walk 10 feet on uneven surface  activity   Assist Walk 10 feet on uneven surfaces activity did not occur: Safety/medical concerns (nausea, lethargy)   Assist level: Supervision/Verbal cueing Assistive device: Walker-rolling   Wheelchair     Assist Is the patient using a wheelchair?: No Type of Wheelchair: Manual Wheelchair activity did not occur: N/A         Wheelchair 50 feet with 2 turns activity    Assist    Wheelchair 50 feet with 2 turns activity did not occur: N/A       Wheelchair 150 feet activity     Assist  Wheelchair 150 feet activity did not occur: N/A       Blood pressure (!) 148/57, pulse 82, temperature 97.8 F (36.6 C), resp. rate 18, height 5\' 3"  (1.6 m), weight 86.2 kg, SpO2 100%. Medical Problem List and Plan: 1. 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  Impaired mobility: d/c lovenox given abdominal bruising and since patient is ambulating >150 feet, continue SCDs             -antiplatelet therapy: continue Aspirin and Plavix for three weeks followed by Plavix alone (has ICA stent)   3. Diabetic peripheral neuropathy: Messaged Sherry to schedule for outpatient Qutenza. Tylenol as needed             -continue Lyrica 75 mg BID   4. Mood/Behavior/Sleep: LCSW to evaluate and provide emotional support             -antipsychotic agents: n/a   5. Neuropsych/cognition: This patient is capable of making decisions on her own behalf.   6. Skin/Wound Care: Routine skin care checks   7. Fluids/Electrolytes/Nutrition: Routine Is and Os and follow-up chemistries             -vitamin B12 daily   8:    9: Hyperlipidemia:  continue statin, Zetia, Repatha   10: DM: A1c = 9.2%             Continue insulin pump, discussed no more than 6U bolus to prevent hypoglycemia             -continue Jardiance 25 mg daily             -d/c SSI as patient is on insulin pump  Advised stopping insulin between dinner and bedtime to prevent 3am hypoglycemia  Requested a [place] sandwich for HS snack, asked nursing to contact dietician as patient did nor receive HS snack last night CBG (  last 3)  Recent Labs    11/17/23 2056 11/18/23 0555 11/18/23 0921  GLUCAP 287* 88 170*   On chronic prednisone as well - IM consulting    3/23 improving control--no changes today 11: 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 Nausea: resolved  20. Right upper extremity pain: VAS [place] ordered to assess for clot, discussed that this was negative  21. Emesis: resolved  22. Chest pain/tachycardia: resolved neg V/Q scan, EKG neg  23. Hypokalemia: K+ improving, continue to encourage fruits and vegetables   24.  Mild DOE, no cough or fever, resp panel Neg, airborne prec d/ced  25.  Constipation:   Last BM 3/25, continue colace  26. Worsening left sided facial droop: CT head ordered and is stable  27. Hypotension/Dizziness: d/c avapro, resolved  28. Anemia: Hgb reviewed and is stable, monitor weekly.   29. HTN:  BP reviewed and is stable  30. Foot drop: discussed with PT potential AFO, toe cap ordered   >30 minutes spent in discharge of patient including review of medications and follow-up appointments, physical  examination, and in answering all patient's questions     LOS: 16 days A FACE TO FACE EVALUATION WAS PERFORMED  Labib Cwynar P Eliazar Olivar 11/18/2023, 10:04 AM

## 2023-11-18 NOTE — Progress Notes (Addendum)
 Inpatient Rehabilitation Discharge Medication Review by a Pharmacist  A complete drug regimen review was completed for this patient to identify any potential clinically significant medication issues.  High Risk Drug Classes Is patient taking? Indication by Medication  Antipsychotic Yes Prochlorperazine- nausea (secondary if ondansetron ineffective)  Anticoagulant No   Antibiotic No   Opioid No   Antiplatelet Yes Aspirin 2 days, clopidogrel ( to complete  3 weeks  of DAPT) then clopidogrel alone - CVA  Hypoglycemics/insulin Yes Insulin lispro via SQ pump,  Jardiance,  Bydureon inj(exenatide)- DM  Vasoactive Medication No   Chemotherapy Yes, Oral Chemotherapy Tamoxifen - breast cancer  Other Yes Zetia, rosuvastatin - HLD Immune globulin soln IV- myasthenia gravis Levothyroxine - low thyroid Loratidine- seasonal allergies Meclizine- migraine -related nausea Montelukast - COPD Pantoprazole - reflux Pregabalin - pain Vitamin D cyanocobalamine - supplement Docusate, psyllium  - stool softener, constipation Albuterol prn SOB Mycophenolate, prednisone-ocular myasthenia gravis Repatha- HLD Rituxan- myasthenia gravis Acetaminophen - pain Ondansetron prn N/V Lidocaine-prilocaine cream x once: topical local anesthetic to skin       Type of Medication Issue Identified Description of Issue Recommendation(s)  Drug Interaction(s) (clinically significant)     Duplicate Therapy     Allergy     No Medication Administration End Date     Incorrect Dose     Additional Drug Therapy Needed     Significant med changes from prior encounter (inform family/care partners about these prior to discharge). Olmesartan, topiramate were discontinued.  Esomeprazole was changed to Pantoprazole.   Communicate to patient /family/ caregiver prior to discharge.   Other       Clinically significant medication issues were identified that warrant physician communication and completion of  prescribed/recommended actions by midnight of the next day:  No  Name of provider notified for urgent issues identified:   Provider Method of Notification:    Pharmacist comments: None  Time spent performing this drug regimen review (minutes): 30   Thank you for allowing pharmacy to be part of this patients care team.  Noah Delaine, RPh Clinical Pharmacist 11/18/2023 11:02 AM

## 2023-11-18 NOTE — Progress Notes (Signed)
 Orthopedic Tech Progress Note Patient Details:  [name] 1950-09-23 [id number] Called in order to [facility] for toe cap Patient ID: [name], female   DOB: [birth year], 73 y.o.   MRN: [id number]  Lovett Calender 11/18/2023, 9:49 AM

## 2023-11-19 NOTE — Progress Notes (Signed)
 Inpatient Rehabilitation Care Coordinator Discharge Note   Patient Details  Name: [name] MRN: [id number] Date of Birth: 01-16-1951   Discharge location: D/c to home  Length of Stay: 15 days  Discharge activity level: Mod I with RW  Home/community participation: Limited  Patient response WU:[id number] Literacy - How often do you need to have someone help you when you read instructions, pamphlets, or other written material from your doctor or pharmacy?: Never  Patient response YN:WGNFAO Isolation - How often do you feel lonely or isolated from those around you?: Never  Services provided included: MD, RD, PT, OT, RN, CM, TR, Pharmacy, Neuropsych, SW  Financial Services:  Field seismologist Utilized: Private Insurance [facility] Medicare  Choices offered to/list presented to: patient  Follow-up services arranged:  Outpatient    Outpatient Servicies: [facility] for PT/OT      Patient response to transportation need: Is the patient able to respond to transportation needs?: Yes In the past 12 months, has lack of transportation kept you from medical appointments or from getting medications?: No In the past 12 months, has lack of transportation kept you from meetings, work, or from getting things needed for daily living?: No   Patient/Family verbalized understanding of follow-up arrangements:  Yes  Individual responsible for coordination of the follow-up plan: contact pt or her sister [name]  Confirmed correct DME delivered: [name] 11/19/2023    Comments (or additional information):fam edu completed  Summary of Stay    Date/Time Discharge Planning CSW  11/16/23 2150 Pt will d/c to home with support from her sister [name] who lives accross the street, and other siblings as well to assist. fam edu completed Mon 1pm-3pm with her sisters.  Outpatient PT/OT at [facility]. SW will confirm there are no barriers to discharge. AAC  11/15/23 1031 Pt will d/c to  home with support from her sister [name] who lives accross the street, and other siblings as well to assist. fam edu on Mon 1pm-3pm with her sisters. SW will confirm there are no barriers to discharge. AAC  11/10/23 1518 Pt will d/c to home with support from her sister [name] who lives accross the street, and other siblings as well to assist. SW will confirm there are no barriers to discharge. AAC  11/03/23 1140 TBA. Per EMR, pt will d/c tohome with support from her sister and other siblings as well to assist. SW will confirm there are no barriers to discharge. AAC       [name] [name]

## 2023-11-24 ENCOUNTER — Ambulatory Visit: Admitting: Occupational Therapy

## 2023-11-24 ENCOUNTER — Encounter: Payer: Self-pay | Admitting: Physical Therapy

## 2023-11-24 ENCOUNTER — Encounter: Payer: Self-pay | Admitting: Occupational Therapy

## 2023-11-24 ENCOUNTER — Other Ambulatory Visit: Payer: Self-pay | Admitting: Neurology

## 2023-11-24 ENCOUNTER — Other Ambulatory Visit: Payer: Self-pay

## 2023-11-24 ENCOUNTER — Ambulatory Visit: Attending: Physician Assistant | Admitting: Physical Therapy

## 2023-11-24 ENCOUNTER — Telehealth: Payer: Self-pay | Admitting: Neurology

## 2023-11-24 VITALS — BP 193/91 | HR 83

## 2023-11-24 DIAGNOSIS — C50411 Malignant neoplasm of upper-outer quadrant of right female breast: Secondary | ICD-10-CM | POA: Insufficient documentation

## 2023-11-24 DIAGNOSIS — M21371 Foot drop, right foot: Secondary | ICD-10-CM | POA: Diagnosis not present

## 2023-11-24 DIAGNOSIS — R2681 Unsteadiness on feet: Secondary | ICD-10-CM | POA: Insufficient documentation

## 2023-11-24 DIAGNOSIS — R29818 Other symptoms and signs involving the nervous system: Secondary | ICD-10-CM

## 2023-11-24 DIAGNOSIS — R208 Other disturbances of skin sensation: Secondary | ICD-10-CM | POA: Diagnosis not present

## 2023-11-24 DIAGNOSIS — R278 Other lack of coordination: Secondary | ICD-10-CM | POA: Insufficient documentation

## 2023-11-24 DIAGNOSIS — M79621 Pain in right upper arm: Secondary | ICD-10-CM

## 2023-11-24 DIAGNOSIS — Z170 Estrogen receptor positive status [ER+]: Secondary | ICD-10-CM | POA: Diagnosis not present

## 2023-11-24 DIAGNOSIS — I69351 Hemiplegia and hemiparesis following cerebral infarction affecting right dominant side: Secondary | ICD-10-CM | POA: Insufficient documentation

## 2023-11-24 DIAGNOSIS — M6281 Muscle weakness (generalized): Secondary | ICD-10-CM

## 2023-11-24 DIAGNOSIS — I6350 Cerebral infarction due to unspecified occlusion or stenosis of unspecified cerebral artery: Secondary | ICD-10-CM | POA: Diagnosis not present

## 2023-11-24 DIAGNOSIS — R2689 Other abnormalities of gait and mobility: Secondary | ICD-10-CM | POA: Diagnosis not present

## 2023-11-24 MED ORDER — CLOPIDOGREL BISULFATE 75 MG PO TABS: 75.0000 mg | ORAL_TABLET | Freq: Every day | ORAL | 1 refills | Status: DC

## 2023-11-24 NOTE — Telephone Encounter (Signed)
 Pr had another stroke March and the hospital prescribed Plavix. Patient wanted to let you know that she was only prescribed 30 days and the hospital advised she notify her doctors to see if they would like to continue, Scheduled with Pearlean Brownie 6/12 - She will also call her primary.

## 2023-11-24 NOTE — Telephone Encounter (Signed)
 Are you comfortable with her continuing the Plavix until her appt with you in June?

## 2023-11-24 NOTE — Therapy (Unsigned)
 OUTPATIENT OCCUPATIONAL THERAPY NEURO EVALUATION  Patient Name: [name] MRN: [id number] DOB:[birth year], 73 y.o., female Today's Date: 11/24/2023  PCP: Irena Reichmann, DO REFERRING PROVIDER: Charlton Amor, PA-C  END OF SESSION:  OT End of Session - 11/24/23 1004     Visit Number 1    Number of Visits 12    Authorization Type [employer] 2025    Authorization Time Period VL: [place]    OT Start Time 1010    OT Stop Time 1100    OT Time Calculation (min) 50 min    Equipment Utilized During Treatment Testing Material    Activity Tolerance Patient tolerated treatment well    Behavior During Therapy WFL for tasks assessed/performed             Past Medical History:  Diagnosis Date   Arthritis    "all over my body" (03/13/2013)   Asthma    Asymptomatic carotid artery stenosis, bilateral 10/08/2018   Breast cancer (HCC)    GERD (gastroesophageal reflux disease)    H/O hiatal hernia    Headache    pt states she has had headaches for about 6 months "off and on"   Heart murmur    pt had echocardiogram on 09/17/21   Hypercholesterolemia 10/08/2018   Hypertension    Hypothyroidism    Myasthenia gravis (HCC)    "in my eyes; diagnsosed > 7 yr ago" (03/13/2013)   Sleep apnea    on CPAP   Stroke (HCC) 2021   Thyroid carcinoma (HCC)    Type II diabetes mellitus (HCC)    Past Surgical History:  Procedure Laterality Date   ABDOMINAL HYSTERECTOMY     ANTERIOR CERVICAL DECOMP/DISCECTOMY FUSION     "I've had severa ORs; always went in from the front" (03/13/2013)   APPENDECTOMY     BREAST LUMPECTOMY WITH RADIOACTIVE SEED AND SENTINEL LYMPH NODE BIOPSY Left 09/02/2022   Procedure: LEFT BREAST LUMPECTOMY WITH RADIOACTIVE SEED AND SENTINEL LYMPH NODE BIOPSY;  Surgeon: Abigail Miyamoto, MD;  Location: [facility] OR;  Service: General;  Laterality: Left;   CARDIAC CATHETERIZATION     "several" (03/13/2013)   CARPAL TUNNEL RELEASE Right    CATARACT EXTRACTION W/ INTRAOCULAR LENS   IMPLANT, BILATERAL Bilateral    CHOLECYSTECTOMY     KNEE ARTHROSCOPY Left    SHOULDER ARTHROSCOPY W/ ROTATOR CUFF REPAIR Left    TONSILLECTOMY     TOTAL THYROIDECTOMY     TRANSCAROTID ARTERY REVASCULARIZATION  Left 09/24/2021   Procedure: LEFT TRANSCAROTID ARTERY REVASCULARIZATION;  Surgeon: Leonie Douglas, MD;  Location: [facility] OR;  Service: Vascular;  Laterality: Left;   TRANSCAROTID ARTERY REVASCULARIZATION  Right 10/23/2021   Procedure: Right Transcarotid Artery Revascularization;  Surgeon: Leonie Douglas, MD;  Location: [facility] OR;  Service: Vascular;  Laterality: Right;   ULTRASOUND GUIDANCE FOR VASCULAR ACCESS Right 09/24/2021   Procedure: ULTRASOUND GUIDANCE FOR VASCULAR ACCESS;  Surgeon: Leonie Douglas, MD;  Location: [facility] OR;  Service: Vascular;  Laterality: Right;   ULTRASOUND GUIDANCE FOR VASCULAR ACCESS Right 10/23/2021   Procedure: ULTRASOUND GUIDANCE FOR VASCULAR ACCESS, LEFT FEMORAL VEIN;  Surgeon: Leonie Douglas, MD;  Location: [facility] OR;  Service: Vascular;  Laterality: Right;   Patient Active Problem List   Diagnosis Date Noted   Nausea and vomiting 11/06/2023   Leukocytosis 11/06/2023   SIRS (systemic inflammatory response syndrome) (HCC) 11/06/2023   Right pontine stroke (HCC) 11/02/2023   Stroke (HCC) 10/29/2023   Peripheral neuropathy 11/03/2022   Port-A-Cath in  place 10/21/2022   S/P breast lumpectomy 09/02/2022   S/P lumpectomy, left breast 09/02/2022   Malignant neoplasm of upper-outer quadrant of left breast in female, estrogen receptor positive (HCC) 08/10/2022   Asymptomatic carotid artery stenosis without infarction, right 10/23/2021   Carotid stenosis 10/23/2021   Stroke-like symptoms    TIA (transient ischemic attack)    Hypokalemia 09/17/2021   Right facial numbness 09/16/2021   Insulin dependent type 2 diabetes mellitus (HCC) 09/16/2021   Hypothyroidism 09/16/2021   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HCC) 12/27/2019   Primary hypertension    Uncomplicated  asthma    Uncontrolled type 2 diabetes mellitus with hyperglycemia (HCC)    Thalamic stroke (HCC) 12/20/2019   Bilateral carotid artery stenosis 10/08/2018   Hyperlipidemia associated with type 2 diabetes mellitus (HCC) 10/08/2018   Headache around the eyes 11/12/2016   Cervicalgia of occipito-atlanto-axial region 11/12/2016   Moderate persistent asthma 07/29/2015   Current use of beta blocker 07/29/2015   Gastroesophageal reflux disease without esophagitis 07/29/2015   OSA on CPAP 09/19/2014   Diabetic neuropathy with neurologic complication (HCC) 09/19/2014   Myasthenia gravis in remission (HCC) 09/19/2014   Dyspnea 08/13/2014   Erb-Goldflam disease (HCC) 07/27/2014   Cancer of thyroid (HCC) 07/27/2014   OSA (obstructive sleep apnea) 07/24/2014   DM (diabetes mellitus) type II uncontrolled with eye manifestation (HCC) 06/01/2014   Follicular thyroid cancer (HCC) 06/01/2014   Nocturia more than twice per night 06/01/2014   Snoring 06/01/2014   Insomnia due to medical condition 06/01/2014   Neuroma 01/16/2014   Pseudoclaudication 11/14/2013   Heart palpitations 06/30/2013   Sinus tachycardia 03/29/2013   Myasthenia gravis (HCC) 03/13/2013   Essential hypertension, benign 03/13/2013   Asthma, chronic 03/13/2013    ONSET DATE: Referral: 11/16/2023 Hospitalized beginning 10/29/23  REFERRING DIAG: I63.50 (ICD-10-CM) - Cerebral infarction due to unspecified occlusion or stenosis of unspecified cerebral artery  THERAPY DIAG:  Muscle weakness (generalized)  Other lack of coordination  Other disturbances of skin sensation  Other symptoms and signs involving the nervous system  Pain in right upper arm  Rationale for Evaluation and Treatment: Rehabilitation  SUBJECTIVE:   SUBJECTIVE STATEMENT: *** Pt accompanied by: self  Pt reports changes in R arm - hurst in the upper arm and ad the hand doesn't work as good as it did before (was rigth handed) and using walker (did so  before) - right root is dragging more.  PERTINENT HISTORY:   Presented to the ED [facility] on 10/29/2023 with right facial, upper and lower extremity numbness. Reported new onset of dizziness for approximately one week prior to presentation at ED. MRI was positive for 5 mm acute infarct within the dorsal pons on the right.   PMHx: prior left hemispheric TIA and left thalamic infarct, bilateral ICA stents, chronic headaches, diabetes mellitus, hypothyroidism, hypertension and diagnosis of breast cancer August, 2024. H/O ocular myasthenia gravis and a history of thyroid cancer status post radiation and total thyroidectomy. She has an insulin pump which will be reinitiated on admission to CIR.  Inpatient rehab OT note:  Patient has met 9 of 9 long term goals due to improved activity tolerance, improved balance, ability to compensate for deficits, functional use of  RIGHT upper and RIGHT lower extremity, and improved coordination.  Patient to discharge at overall Modified Independent level.  Patient's care partner is independent to provide the necessary physical assistance at discharge. Pt's sisters plan to accompany pt at DC for first several days/weeks and attended education regarding  functional transfer and ADL recommendations. Recommendation:  Patient will benefit from ongoing skilled OT services in outpatient setting to continue to advance functional skills in the area of BADL, iADL, and restore RUE functional use .  PRECAUTIONS: Fall  WEIGHT BEARING RESTRICTIONS: No  PAIN:  Are you having pain? Yes: NPRS scale: 6/10 - did not take any medicine Pain location: R arm Pain description: tooth ache pain Aggravating factors: moving it - esp greater than 90 degrees Relieving factors: resting it, tylenol  FALLS: Has patient fallen in last 6 months? No  LIVING ENVIRONMENT: Lives with: lives alone Lives in: House/apartment Stairs: Yes: External: 2 steps; has ramp to cover steps, home is 1  level Has following equipment at home: Dan Humphreys - 2 wheeled, Shower bench, and Grab bars  PLOF: Independent with basic ADLs, Independent with household mobility with device, Independent with community mobility with device, and Needs assistance with homemaking (nephew came ~ every 2 weeks)  PATIENT GOALS: Use right ar better and decreased pain in arm  OBJECTIVE:  Note: Objective measures were completed at Evaluation unless otherwise noted.  HAND DOMINANCE: Right  ADLs: using leg lift to get leg in/out of bed Overall ADLs: *** Transfers/ambulation related to ADLs: Eating: *** Grooming: *** UB Dressing: *** LB Dressing: use grabber to help Toileting: *** Bathing: *** Tub Shower transfers: *** Equipment: Emergency planning/management officer, Reacher, and Long handled sponge  IADLs: Shopping: Government social research officer to sister Light housekeeping: Nephew was coming every 2 weeks Meal Prep: Uses stove Community mobility: RW Medication management: Pillbox and sorts it herself Landscape architect: Ind Handwriting: 90% legible  MOBILITY STATUS: Independent  POSTURE COMMENTS:  rounded shoulders and forward head Sitting balance: {sitting balance:25483}  ACTIVITY TOLERANCE: Activity tolerance: ***  FUNCTIONAL OUTCOME MEASURES: Quick Dash: 29.5  UPPER EXTREMITY ROM:    {AROM/PROM:27142} ROM Right eval Left eval  Shoulder flexion <90 WFl  Shoulder abduction    Shoulder adduction    Shoulder extension    Shoulder internal rotation limited   Shoulder external rotation    Elbow flexion Gets it there slow   Elbow extension    Wrist flexion    Wrist extension    Wrist ulnar deviation    Wrist radial deviation    Wrist pronation    Wrist supination    (Blank rows = not tested)  UPPER EXTREMITY MMT:     MMT Right eval Left eval  Shoulder flexion    Shoulder abduction    Shoulder adduction    Shoulder extension    Shoulder internal rotation    Shoulder external rotation    Middle trapezius     Lower trapezius    Elbow flexion    Elbow extension    Wrist flexion    Wrist extension    Wrist ulnar deviation    Wrist radial deviation    Wrist pronation    Wrist supination    (Blank rows = not tested)  HAND FUNCTION: Grip strength: Right: 16.5, 17.6, 18.5 lbs; Left: 59.3, 61.9, 66.1 lbs  COORDINATION: 9 Hole Peg test: Right: 23.89 sec; Left: 27.68 sec Box and Blocks:  Right 48 blocks, Left 51 blocks - hurts a little/tired R ahnd shakes/tremulous SENSATION: Light touch: Impaired  L side normal, R side dul, pinkie completley numb up into hand some Sensation Sensation Light Touch: Impaired Detail Light Touch Impaired Details: Impaired RLE Hot/Cold: Not tested Proprioception: Appears Intact Stereognosis: Not tested Additional Comments: decreased sensation along RLE with tingling in R lower leg  and foot. Coordination Gross Motor Movements are Fluid and Coordinated: No Fine Motor Movements are Fluid and Coordinated: Yes Coordination and Movement Description: grossly uncoordinated due to R hemiparesis with visual impairments Finger Nose Finger Test: slow Heel Shin Test: unable to perform on RLE, slow and decresaed ROM on LLE  EDEMA: NA  MUSCLE TONE: WFL  COGNITION: Overall cognitive status:  No changes by pt report BIMS Summary Score: 15 VISION: Subjective report: double vision/floaters - years - Mysathenia in eyes, hasn't drivine since first storke in 2020 Baseline vision: Bifocals Visual history: cataracts and mysathenai gravis and caratact sx both  VISION ASSESSMENT: {visionassessment:27231}  Patient has difficulty with following activities due to following visual impairments: ***  PERCEPTION: {Perception:25564}  PRAXIS: {Praxis:25565}  OBSERVATIONS: ***                                                                                                                             TREATMENT DATE: ***   Pt issued tendon gliding exercises/handout with  review of motions to isolate DIP, PIP and MCP joints for straight finger position, hook (DIP/PIP flexion), fist (DIP/PIP/MCP flexion), taco/duck (MCP flexion only) and flat fist (MCP and PIP flexion).        PATIENT EDUCATION: Education details: *** Person educated: {Person educated:25204} Education method: {Education Method:25205} Education comprehension: {Education Comprehension:25206}  HOME EXERCISE PROGRAM: ***   GOALS: Goals reviewed with patient? {yes/no:20286}  SHORT TERM GOALS: Target date: ***  *** Baseline: Goal status: INITIAL  2.  *** Baseline:  Goal status: INITIAL  3.  *** Baseline:  Goal status: INITIAL  4.  *** Baseline:  Goal status: INITIAL  5.  *** Baseline:  Goal status: INITIAL  6.  *** Baseline:  Goal status: INITIAL  LONG TERM GOALS: Target date: ***  *** Baseline:  Goal status: INITIAL  2.  *** Baseline:  Goal status: INITIAL  3.  *** Baseline:  Goal status: INITIAL  4.  *** Baseline:  Goal status: INITIAL  5.  *** Baseline:  Goal status: INITIAL  6.  *** Baseline:  Goal status: INITIAL  ASSESSMENT:  CLINICAL IMPRESSION: Patient is a *** y.o. *** who was seen today for occupational therapy evaluation for ***.   PERFORMANCE DEFICITS: in functional skills including {OT physical skills:25468}, cognitive skills including {OT cognitive skills:25469}, and psychosocial skills including {OT psychosocial skills:25470}.   IMPAIRMENTS: are limiting patient from {OT performance deficits:25471}.   CO-MORBIDITIES: {Comorbidities:25485} that affects occupational performance. Patient will benefit from skilled OT to address above impairments and improve overall function.  MODIFICATION OR ASSISTANCE TO COMPLETE EVALUATION: {OT modification:25474}  OT OCCUPATIONAL PROFILE AND HISTORY: {OT PROFILE AND HISTORY:25484}  CLINICAL DECISION MAKING: {OT CDM:25475}  REHAB POTENTIAL: {rehabpotential:25112}  EVALUATION COMPLEXITY:  {Evaluation complexity:25115}    PLAN:  OT FREQUENCY: {rehab frequency:25116}  OT DURATION: {rehab duration:25117}  PLANNED INTERVENTIONS: {OT Interventions:25467}  RECOMMENDED OTHER SERVICES: ***  CONSULTED AND AGREED WITH PLAN OF CARE: {ZOX:09604}  PLAN FOR NEXT  SESSION: ***   Victorino Sparrow, OT 11/24/2023, 5:08 PM

## 2023-11-24 NOTE — Therapy (Signed)
 OUTPATIENT PHYSICAL THERAPY NEURO EVALUATION   Patient Name: [name] MRN: [id number] DOB:1950/11/10, 73 y.o., female Today's Date: 11/24/2023   PCP: Irena Reichmann, DO REFERRING PROVIDER: Charlton Amor, PA-C  END OF SESSION:  PT End of Session - 11/24/23 1106     Visit Number 1    Number of Visits 9   Plus eval   Date for PT Re-Evaluation 12/29/23    Authorization Type [employer]    PT Start Time 1104    PT Stop Time 1125    PT Time Calculation (min) 21 min    Activity Tolerance Treatment limited secondary to medical complications (Comment)   HTN   Behavior During Therapy WFL for tasks assessed/performed             Past Medical History:  Diagnosis Date   Arthritis    "all over my body" (03/13/2013)   Asthma    Asymptomatic carotid artery stenosis, bilateral 10/08/2018   Breast cancer (HCC)    GERD (gastroesophageal reflux disease)    H/O hiatal hernia    Headache    pt states she has had headaches for about 6 months "off and on"   Heart murmur    pt had echocardiogram on 09/17/21   Hypercholesterolemia 10/08/2018   Hypertension    Hypothyroidism    Myasthenia gravis (HCC)    "in my eyes; diagnsosed > 7 yr ago" (03/13/2013)   Sleep apnea    on CPAP   Stroke (HCC) 2021   Thyroid carcinoma (HCC)    Type II diabetes mellitus (HCC)    Past Surgical History:  Procedure Laterality Date   ABDOMINAL HYSTERECTOMY     ANTERIOR CERVICAL DECOMP/DISCECTOMY FUSION     "I've had severa ORs; always went in from the front" (03/13/2013)   APPENDECTOMY     BREAST LUMPECTOMY WITH RADIOACTIVE SEED AND SENTINEL LYMPH NODE BIOPSY Left 09/02/2022   Procedure: LEFT BREAST LUMPECTOMY WITH RADIOACTIVE SEED AND SENTINEL LYMPH NODE BIOPSY;  Surgeon: Abigail Miyamoto, MD;  Location: [facility] OR;  Service: General;  Laterality: Left;   CARDIAC CATHETERIZATION     "several" (03/13/2013)   CARPAL TUNNEL RELEASE Right    CATARACT EXTRACTION W/ INTRAOCULAR LENS  IMPLANT, BILATERAL  Bilateral    CHOLECYSTECTOMY     KNEE ARTHROSCOPY Left    SHOULDER ARTHROSCOPY W/ ROTATOR CUFF REPAIR Left    TONSILLECTOMY     TOTAL THYROIDECTOMY     TRANSCAROTID ARTERY REVASCULARIZATION  Left 09/24/2021   Procedure: LEFT TRANSCAROTID ARTERY REVASCULARIZATION;  Surgeon: Leonie Douglas, MD;  Location: [facility] OR;  Service: Vascular;  Laterality: Left;   TRANSCAROTID ARTERY REVASCULARIZATION  Right 10/23/2021   Procedure: Right Transcarotid Artery Revascularization;  Surgeon: Leonie Douglas, MD;  Location: [facility] OR;  Service: Vascular;  Laterality: Right;   ULTRASOUND GUIDANCE FOR VASCULAR ACCESS Right 09/24/2021   Procedure: ULTRASOUND GUIDANCE FOR VASCULAR ACCESS;  Surgeon: Leonie Douglas, MD;  Location: [facility] OR;  Service: Vascular;  Laterality: Right;   ULTRASOUND GUIDANCE FOR VASCULAR ACCESS Right 10/23/2021   Procedure: ULTRASOUND GUIDANCE FOR VASCULAR ACCESS, LEFT FEMORAL VEIN;  Surgeon: Leonie Douglas, MD;  Location: [facility] OR;  Service: Vascular;  Laterality: Right;   Patient Active Problem List   Diagnosis Date Noted   Nausea and vomiting 11/06/2023   Leukocytosis 11/06/2023   SIRS (systemic inflammatory response syndrome) (HCC) 11/06/2023   Right pontine stroke (HCC) 11/02/2023   Stroke (HCC) 10/29/2023   Peripheral neuropathy 11/03/2022   Port-A-Cath in  place 10/21/2022   S/P breast lumpectomy 09/02/2022   S/P lumpectomy, left breast 09/02/2022   Malignant neoplasm of upper-outer quadrant of left breast in female, estrogen receptor positive (HCC) 08/10/2022   Asymptomatic carotid artery stenosis without infarction, right 10/23/2021   Carotid stenosis 10/23/2021   Stroke-like symptoms    TIA (transient ischemic attack)    Hypokalemia 09/17/2021   Right facial numbness 09/16/2021   Insulin dependent type 2 diabetes mellitus (HCC) 09/16/2021   Hypothyroidism 09/16/2021   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HCC) 12/27/2019   Primary hypertension    Uncomplicated asthma    Uncontrolled  type 2 diabetes mellitus with hyperglycemia (HCC)    Thalamic stroke (HCC) 12/20/2019   Bilateral carotid artery stenosis 10/08/2018   Hyperlipidemia associated with type 2 diabetes mellitus (HCC) 10/08/2018   Headache around the eyes 11/12/2016   Cervicalgia of occipito-atlanto-axial region 11/12/2016   Moderate persistent asthma 07/29/2015   Current use of beta blocker 07/29/2015   Gastroesophageal reflux disease without esophagitis 07/29/2015   OSA on CPAP 09/19/2014   Diabetic neuropathy with neurologic complication (HCC) 09/19/2014   Myasthenia gravis in remission (HCC) 09/19/2014   Dyspnea 08/13/2014   Erb-Goldflam disease (HCC) 07/27/2014   Cancer of thyroid (HCC) 07/27/2014   OSA (obstructive sleep apnea) 07/24/2014   DM (diabetes mellitus) type II uncontrolled with eye manifestation (HCC) 06/01/2014   Follicular thyroid cancer (HCC) 06/01/2014   Nocturia more than twice per night 06/01/2014   Snoring 06/01/2014   Insomnia due to medical condition 06/01/2014   Neuroma 01/16/2014   Pseudoclaudication 11/14/2013   Heart palpitations 06/30/2013   Sinus tachycardia 03/29/2013   Myasthenia gravis (HCC) 03/13/2013   Essential hypertension, benign 03/13/2013   Asthma, chronic 03/13/2013    ONSET DATE: 11/15/2023 (referral)   REFERRING DIAG: I63.50 (ICD-10-CM) - Cerebral infarction due to unspecified occlusion or stenosis of unspecified cerebral artery  THERAPY DIAG:  Hemiplegia and hemiparesis following cerebral infarction affecting right dominant side (HCC)  Muscle weakness (generalized)  Unsteadiness on feet  Other abnormalities of gait and mobility  Foot drop, right  Rationale for Evaluation and Treatment: Rehabilitation  SUBJECTIVE:                                                                                                                                                                                             SUBJECTIVE STATEMENT: Pt presents w/RW.  States she was using a RW at all times prior to this most recent stroke. Had a previous stroke in 2020 that also affected the R side. Lives alone and is able to do all her ADLs. 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   Pt accompanied by: self  PERTINENT HISTORY: prior left hemispheric TIA and left thalamic infarct, bilateral ICA stents, chronic headaches, diabetes mellitus, hypothyroidism, hypertension and diagnosis of breast cancer August 2024. Presented to the ED on 10/29/2023 with right facial, upper and lower extremity numbness. Reported new onset of dizziness for approximately one week prior to presentation at ED  PAIN:  Are you having pain? Yes: NPRS scale: 5-7/10 Pain location: headache and RUE Pain description: Throbbing, achy  Aggravating factors: moving it  Relieving factors: resting   PRECAUTIONS: Fall and Other: Port in R chest   RED FLAGS: None   WEIGHT BEARING RESTRICTIONS: No  FALLS: Has patient fallen in last 6 months? No  LIVING ENVIRONMENT: Lives with: lives alone Lives in: House/apartment Stairs: Yes: External: 2 steps; none Has following equipment at home: Environmental consultant - 2 wheeled, Shower bench, Grab bars, and Ramped entry  PLOF: Requires assistive device for independence  PATIENT GOALS: "I wanna get my foot up without using my hand- I don't want to drag it"   OBJECTIVE:  Note: Objective measures were completed at Evaluation unless otherwise noted.  DIAGNOSTIC FINDINGS: MRI of brain from 10/29/2023  IMPRESSION: 1. 5 mm acute infarct within the dorsal pons on the right. 2. Chronic lacunar infarcts and chronic small ischemic changes elsewhere within the pons, new from the prior brain MRI of 09/17/2021. 3. Chronic lacunar infarcts within the bilateral thalami, also new from the prior MRI. 4. Few nonspecific punctate chronic microhemorrhages within the supratentorial brain. 5. Mild paranasal sinus disease as described.  COGNITION: Overall  cognitive status: Within functional limits for tasks assessed   SENSATION: Pt reports she had a lot of burning tingling in her legs at the hospital, thinks it was a side effect of a medication because it has gotten better since being home. Still has numbness of her hands    POSTURE: rounded shoulders, forward head, and increased thoracic kyphosis  LOWER EXTREMITY ROM:     Active  Right Eval Left Eval  Hip flexion    Hip extension    Hip abduction    Hip adduction    Hip internal rotation    Hip external rotation    Knee flexion    Knee extension    Ankle dorsiflexion    Ankle plantarflexion    Ankle inversion    Ankle eversion     (Blank rows = not tested)  LOWER EXTREMITY MMT:    MMT Right Eval Left Eval  Hip flexion    Hip extension    Hip abduction    Hip adduction    Hip internal rotation    Hip external rotation    Knee flexion    Knee extension    Ankle dorsiflexion    Ankle plantarflexion    Ankle inversion    Ankle eversion    (Blank rows = not tested)  BED MOBILITY:  Independent per pt- using a leg strap to move her RLE into/out of bed   TRANSFERS: Assistive device utilized: Environmental consultant - 2 wheeled  Sit to stand: Modified independence Stand to sit: Modified independence Chair to chair: Modified independence  GAIT: Gait pattern: step through pattern, decreased step length- Right, decreased stride length, decreased hip/knee flexion- Right, decreased ankle dorsiflexion- Right, lateral hip instability, and poor foot clearance- Right Distance walked: Various clinic distances  Assistive device utilized: Walker - 2 wheeled Level of assistance: Modified independence Comments: Pt wearing toe cap on RLE and  dragging R foot.    VITALS  Vitals:   11/24/23 1115 11/24/23 1122  BP: (!) 182/90 (!) 193/91  Pulse: 86 83                                                                                                                   TREATMENT:    Self-care/home management  Assessed vitals (see above) in R forearm and BP too elevated to safely participate in outcome measures. Pt reports she did not take her BP meds this morning as she was rushing to get here. Encouraged pt to go home and take her meds and continue checking her BP. If her BP remains elevated, encouraged pt to call PCP.  Encouraged pt to bring her personal BP cuff to next session to calibrate it using clinic cuff. Pt verbalized understanding.     PATIENT EDUCATION: Education details: POC, see self-care section  Person educated: Patient Education method: Explanation Education comprehension: verbalized understanding  HOME EXERCISE PROGRAM: To be established   GOALS: Goals reviewed with patient? Yes  STG = LTG DUE TO POC LENGTH   LONG TERM GOALS: Target date: 12/22/2023   Pt will be independent with final HEP for improved strength, balance, transfers and gait.  Baseline:  Goal status: INITIAL  2.  5x STS to be assessed and goal updated  Baseline:  Goal status: INITIAL  3.  to be assessed and goal updated  Baseline:  Goal status: INITIAL  4.  Pt will report ability to bring RLE into/out of bed without use of strap for improved functional RLE strength  Baseline:  Goal status: INITIAL   ASSESSMENT:  CLINICAL IMPRESSION: Eval limited due to elevated BP. Patient is a 73 year old female referred to Neuro OPPT for CVA. Pt's PMH is significant for: prior left hemispheric TIA and left thalamic infarct, bilateral ICA stents, chronic headaches, diabetes mellitus, hypothyroidism, hypertension and diagnosis of breast cancer August, 2024. The following deficits were present during the exam: decreased functional strength, impaired vision, impaired sensation and impaired balance. Unable to fully assess deficits due to pt's elevated BP, so will assess next session. Pt would benefit from skilled PT to address these impairments and functional limitations to  maximize functional mobility independence.    OBJECTIVE IMPAIRMENTS: Abnormal gait, decreased activity tolerance, decreased balance, decreased endurance, decreased mobility, difficulty walking, decreased strength, impaired sensation, impaired UE functional use, impaired vision/preception, and pain  ACTIVITY LIMITATIONS: carrying, lifting, bending, standing, stairs, and locomotion level  PARTICIPATION LIMITATIONS: driving, shopping, community activity, and yard work  PERSONAL FACTORS: Past/current experiences, Transportation, and 1-2 comorbidities: Previous CVA and double vision  are also affecting patient's functional outcome.   REHAB POTENTIAL: Good  CLINICAL DECISION MAKING: Evolving/moderate complexity  EVALUATION COMPLEXITY: Moderate  PLAN:  PT FREQUENCY: 1-2x/week  PT DURATION: 4 weeks  PLANNED INTERVENTIONS: 97164- PT Re-evaluation, 97110-Therapeutic exercises, 97530- Therapeutic activity, [id number]- Neuromuscular re-education, 97535- Self Care, 16109- Manual therapy, [id number]- Gait training, [phone]- Orthotic Fit/training, [id number]- Aquatic Therapy, [phone]- Electrical stimulation (manual),  Patient/Family education, Balance training, Stair training, Dry Needling, Joint mobilization, Vestibular training, and DME instructions  PLAN FOR NEXT SESSION: Assess 5x STS and and update goals. Work on RLE [employer], transfers and trial braces    Adlean Hardeman E Jeffre Enriques, PT, DPT 11/24/2023, 11:31 AM

## 2023-11-25 ENCOUNTER — Telehealth: Payer: Self-pay | Admitting: Hematology

## 2023-11-25 ENCOUNTER — Other Ambulatory Visit: Payer: Self-pay

## 2023-11-25 NOTE — Telephone Encounter (Signed)
 Spoke w/Pt regarding Plavix. Informed Pt Dr. Pearlean Brownie renewed med refill and was sent to her mail order pharmacy. Pt also requested we add [facility] on [address] as her back up pharmacy. Pt stated thanks for call back.

## 2023-11-30 ENCOUNTER — Ambulatory Visit: Admitting: Physical Therapy

## 2023-11-30 ENCOUNTER — Ambulatory Visit: Admitting: Occupational Therapy

## 2023-12-01 NOTE — Assessment & Plan Note (Signed)
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 -she completed adjuvant chemo with weekly abraxane X12 in 01/2023. Due to her neuropathy, chemo dose reduced.  -She completed adjuvant radiation in August 2024 -Her bone density scan in August 2024 showed multiple sites of osteopenia with T-score -2.3, high risk of hip fracture is 3% in the next 10 years. -I discussed the role of adjuvant antiestrogen therapy, given her high risk of hip fracture from osteopenia, I recommended tamoxifen for 5 years, giving weak ER positivity. She started in 04/2023

## 2023-12-02 ENCOUNTER — Inpatient Hospital Stay

## 2023-12-02 ENCOUNTER — Inpatient Hospital Stay: Attending: Hematology | Admitting: Hematology

## 2023-12-02 VITALS — BP 138/60 | HR 98 | Temp 98.2°F | Resp 19 | Ht 63.0 in | Wt 199.6 lb

## 2023-12-02 DIAGNOSIS — Z8673 Personal history of transient ischemic attack (TIA), and cerebral infarction without residual deficits: Secondary | ICD-10-CM | POA: Insufficient documentation

## 2023-12-02 DIAGNOSIS — I6523 Occlusion and stenosis of bilateral carotid arteries: Secondary | ICD-10-CM | POA: Diagnosis not present

## 2023-12-02 DIAGNOSIS — Z79899 Other long term (current) drug therapy: Secondary | ICD-10-CM | POA: Insufficient documentation

## 2023-12-02 DIAGNOSIS — Z9641 Presence of insulin pump (external) (internal): Secondary | ICD-10-CM | POA: Diagnosis not present

## 2023-12-02 DIAGNOSIS — Z170 Estrogen receptor positive status [ER+]: Secondary | ICD-10-CM | POA: Insufficient documentation

## 2023-12-02 DIAGNOSIS — Z90710 Acquired absence of both cervix and uterus: Secondary | ICD-10-CM | POA: Insufficient documentation

## 2023-12-02 DIAGNOSIS — M8589 Other specified disorders of bone density and structure, multiple sites: Secondary | ICD-10-CM | POA: Insufficient documentation

## 2023-12-02 DIAGNOSIS — Z923 Personal history of irradiation: Secondary | ICD-10-CM | POA: Diagnosis not present

## 2023-12-02 DIAGNOSIS — Z7902 Long term (current) use of antithrombotics/antiplatelets: Secondary | ICD-10-CM | POA: Insufficient documentation

## 2023-12-02 DIAGNOSIS — D649 Anemia, unspecified: Secondary | ICD-10-CM | POA: Insufficient documentation

## 2023-12-02 DIAGNOSIS — G620 Drug-induced polyneuropathy: Secondary | ICD-10-CM | POA: Insufficient documentation

## 2023-12-02 DIAGNOSIS — T451X5D Adverse effect of antineoplastic and immunosuppressive drugs, subsequent encounter: Secondary | ICD-10-CM | POA: Insufficient documentation

## 2023-12-02 DIAGNOSIS — C50412 Malignant neoplasm of upper-outer quadrant of left female breast: Secondary | ICD-10-CM | POA: Insufficient documentation

## 2023-12-02 DIAGNOSIS — E7800 Pure hypercholesterolemia, unspecified: Secondary | ICD-10-CM | POA: Insufficient documentation

## 2023-12-02 DIAGNOSIS — E118 Type 2 diabetes mellitus with unspecified complications: Secondary | ICD-10-CM | POA: Diagnosis not present

## 2023-12-02 DIAGNOSIS — I6930 Unspecified sequelae of cerebral infarction: Secondary | ICD-10-CM | POA: Diagnosis not present

## 2023-12-02 DIAGNOSIS — E1122 Type 2 diabetes mellitus with diabetic chronic kidney disease: Secondary | ICD-10-CM | POA: Insufficient documentation

## 2023-12-02 DIAGNOSIS — I129 Hypertensive chronic kidney disease with stage 1 through stage 4 chronic kidney disease, or unspecified chronic kidney disease: Secondary | ICD-10-CM | POA: Diagnosis not present

## 2023-12-02 DIAGNOSIS — I69393 Ataxia following cerebral infarction: Secondary | ICD-10-CM | POA: Diagnosis not present

## 2023-12-02 DIAGNOSIS — Z801 Family history of malignant neoplasm of trachea, bronchus and lung: Secondary | ICD-10-CM | POA: Insufficient documentation

## 2023-12-02 DIAGNOSIS — Z9889 Other specified postprocedural states: Secondary | ICD-10-CM | POA: Diagnosis not present

## 2023-12-02 DIAGNOSIS — Z79811 Long term (current) use of aromatase inhibitors: Secondary | ICD-10-CM | POA: Diagnosis not present

## 2023-12-02 DIAGNOSIS — D0592 Unspecified type of carcinoma in situ of left breast: Secondary | ICD-10-CM | POA: Diagnosis not present

## 2023-12-02 DIAGNOSIS — N189 Chronic kidney disease, unspecified: Secondary | ICD-10-CM | POA: Diagnosis not present

## 2023-12-02 DIAGNOSIS — G7000 Myasthenia gravis without (acute) exacerbation: Secondary | ICD-10-CM | POA: Diagnosis not present

## 2023-12-02 DIAGNOSIS — Z7982 Long term (current) use of aspirin: Secondary | ICD-10-CM | POA: Insufficient documentation

## 2023-12-02 DIAGNOSIS — Z09 Encounter for follow-up examination after completed treatment for conditions other than malignant neoplasm: Secondary | ICD-10-CM | POA: Diagnosis not present

## 2023-12-02 LAB — CMP (CANCER CENTER ONLY)
ALT: 13 U/L (ref 0–44)
AST: 18 U/L (ref 15–41)
Albumin: 3.2 g/dL — ABNORMAL LOW (ref 3.5–5.0)
Alkaline Phosphatase: 85 U/L (ref 38–126)
Anion gap: 8 (ref 5–15)
BUN: 15 mg/dL (ref 8–23)
CO2: 25 mmol/L (ref 22–32)
Calcium: 8.4 mg/dL — ABNORMAL LOW (ref 8.9–10.3)
Chloride: 107 mmol/L (ref 98–111)
Creatinine: 0.93 mg/dL (ref 0.44–1.00)
GFR, Estimated: >60 mL/min (ref >60)
Glucose, Bld: 195 mg/dL — ABNORMAL HIGH (ref 70–99)
Potassium: 3.8 mmol/L (ref 3.5–5.1)
Sodium: 140 mmol/L (ref 135–145)
Total Bilirubin: 0.3 mg/dL (ref 0.0–1.2)
Total Protein: 7.8 g/dL (ref 6.5–8.1)

## 2023-12-02 LAB — CBC WITH DIFFERENTIAL (CANCER CENTER ONLY)
Abs Immature Granulocytes: 0.01 10*3/uL (ref 0.00–0.07)
Basophils Absolute: 0 10*3/uL (ref 0.0–0.1)
Basophils Relative: 1 %
Eosinophils Absolute: 0 10*3/uL (ref 0.0–0.5)
Eosinophils Relative: 0 %
HCT: 29.1 % — ABNORMAL LOW (ref 36.0–46.0)
Hemoglobin: 9.3 g/dL — ABNORMAL LOW (ref 12.0–15.0)
Immature Granulocytes: 0 %
Lymphocytes Relative: 16 %
Lymphs Abs: 0.6 10*3/uL — ABNORMAL LOW (ref 0.7–4.0)
MCH: 30.1 pg (ref 26.0–34.0)
MCHC: 32 g/dL (ref 30.0–36.0)
MCV: 94.2 fL (ref 80.0–100.0)
Monocytes Absolute: 0.6 10*3/uL (ref 0.1–1.0)
Monocytes Relative: 15 %
Neutro Abs: 2.5 10*3/uL (ref 1.7–7.7)
Neutrophils Relative %: 68 %
Platelet Count: 192 10*3/uL (ref 150–400)
RBC: 3.09 MIL/uL — ABNORMAL LOW (ref 3.87–5.11)
RDW: 13.5 % (ref 11.5–15.5)
WBC Count: 3.7 10*3/uL — ABNORMAL LOW (ref 4.0–10.5)
nRBC: 0 % (ref 0.0–0.2)

## 2023-12-02 MED ORDER — EXEMESTANE 25 MG PO TABS: 25.0000 mg | ORAL_TABLET | Freq: Every day | ORAL | 5 refills | Status: DC

## 2023-12-02 NOTE — Patient Instructions (Signed)

## 2023-12-02 NOTE — Progress Notes (Signed)
 [facility]   [phone] [phone]   Clinic Follow up Note   Patient Care Team: Irena Reichmann, DO as PCP - General (Family Medicine) Micki Riley, MD as Consulting Physician (Neurology) Ihor Austin, NP as Nurse Practitioner (Neurology) Donnelly Angelica, RN as Oncology Nurse Navigator Pershing Proud, RN as Oncology Nurse Navigator Abigail Miyamoto, MD as Consulting Physician (General Surgery) Malachy Mood, MD as Consulting Physician (Hematology) Dorothy Puffer, MD as Consulting Physician (Radiation Oncology) Dianne Dun, MD as Consulting Physician (Rheumatology) Mammography, [facility] as Consulting Physician (Diagnostic Radiology) Pollyann Samples, NP as Nurse Practitioner (Nurse Practitioner)  Date of Service:  12/02/2023  CHIEF COMPLAINT: f/u of breast cancer  CURRENT THERAPY:  Tamoxifen  Oncology History   Malignant neoplasm of upper-outer quadrant of left breast in female, 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 -she completed adjuvant chemo with weekly abraxane X12 in 01/2023. Due to her neuropathy, chemo dose reduced.  -She completed adjuvant radiation in August 2024 -Her bone density scan in August 2024 showed multiple sites of osteopenia with T-score -2.3, high risk of hip fracture is 3% in the next 10 years. -I discussed the role of adjuvant antiestrogen therapy, given her high risk of hip fracture from osteopenia, I recommended tamoxifen for 5 years, giving weak ER positivity. She started in 04/2023   Assessment & Plan Breast cancer Diagnosed in December 2023, ER positive but weakly. Completed chemotherapy and radiation. On tamoxifen for six months. Discussed risks of tamoxifen, including small risk of blood  clots and endometrial cancer, though the latter is not a concern due to hysterectomy. Recent stroke may not be related to tamoxifen due to other risk factors like diabetes, hypertension, and chronic kidney disease. Considered switching to anastrozole due to lower thrombosis risk but may cause joint pain and worsen bone density. Exemestane suggested as an alternative with potentially less joint pain. Exemestane, anastrozole, and tamoxifen have similar efficacy in preventing cancer recurrence. - Prescribe exemestane and send prescription to [facility] pharmacy. - Instruct to stop tamoxifen upon starting exemestane. - Advise to contact if exemestane is too expensive or if side effects occur.  Stroke Experienced a stroke in March 2025 with right-sided facial numbness and weakness in arm and leg. Undergoing outpatient rehabilitation. Stroke may not be related to tamoxifen due to other risk factors including previous stroke, diabetes, hypertension, and chronic kidney disease. On Plavix and stopped aspirin as per hospital instructions. - Continue outpatient rehabilitation. - Continue Plavix 75 mg daily. - Follow up with neurologist next month.  Anemia Anemia since March 2025, hemoglobin at 9.3. Likely related to diabetes and chronic kidney disease. Taking prenatal multivitamin with iron, recommended to help with anemia. - Continue prenatal multivitamin with iron. - Plan for blood work to investigate anemia during next visit.  Plan -Due to her recent stroke, I will stop her tamoxifen, and switch to exemestane.  I called in today -Will obtain additional lab on next visit for anemia -Lab and follow-up in 3 months   SUMMARY OF ONCOLOGIC HISTORY: Oncology History Overview Note   Cancer Staging  Malignant neoplasm of upper-outer quadrant of left breast in female, estrogen receptor positive (HCC) Staging form: Breast, AJCC 8th Edition - Clinical: Stage IB (cT1b, cN0, cM0, G3, ER+, PR-, HER2-) - Signed by  Julien Girt,  Marsh Dolly, PA-C on 08/10/2022 Stage prefix: Initial diagnosis Method of lymph node assessment: Clinical Histologic grading system: 3 grade system - Pathologic stage from 09/02/2022: Stage IB (pT1b, pN0, cM0, G2, ER-, PR-, HER2-) - Signed by Malachy Mood, MD on 09/23/2022 Stage prefix: Initial diagnosis Histologic grading system: 3 grade system Residual tumor (R): R0 - None     Malignant neoplasm of upper-outer quadrant of left breast in female, estrogen receptor positive (HCC)  08/10/2022 Initial Diagnosis   Malignant neoplasm of upper-outer quadrant of left female breast (HCC)   08/10/2022 Cancer Staging   Staging form: Breast, AJCC 8th Edition - Clinical: Stage IB (cT1b, cN0, cM0, G3, ER+, PR-, HER2-) - Signed by Ronny Bacon, PA-C on 08/10/2022 Stage prefix: Initial diagnosis Method of lymph node assessment: Clinical Histologic grading system: 3 grade system   09/02/2022 Cancer Staging   Staging form: Breast, AJCC 8th Edition - Pathologic stage from 09/02/2022: Stage IB (pT1b, pN0, cM0, G2, ER-, PR-, HER2-) - Signed by Malachy Mood, MD on 09/23/2022 Stage prefix: Initial diagnosis Histologic grading system: 3 grade system Residual tumor (R): R0 - None   10/21/2022 -  Chemotherapy   Patient is on Treatment Plan : BREAST PACLitaxel-Albumin (Abraxane) (100) D1,8,15 q28d     07/28/2023 Survivorship   SCP delivered by Santiago Glad, NP      Discussed the use of AI scribe software for clinical note transcription with the patient, who gave verbal consent to proceed.  History of Present Illness A 73 year old patient with a history of breast cancer, diabetes, hypertension, chronic kidney disease, and anemia presents for a follow-up visit. The patient has been on tamoxifen for the past six months and reports no significant side effects. However, she recently experienced a stroke, which resulted in right-sided facial numbness, arm weakness, and leg dragging. She is currently  undergoing outpatient rehabilitation for these symptoms. The patient also reports a history of a previous stroke over five years ago. She denies any worsening of memory since the recent stroke and is able to function independently. The patient has a port for infusions and chemotherapy treatments. She also reports taking a prenatal multivitamin with iron for her anemia.     All other systems were reviewed with the patient and are negative.  MEDICAL HISTORY:  Past Medical History:  Diagnosis Date   Arthritis    "all over my body" (03/13/2013)   Asthma    Asymptomatic carotid artery stenosis, bilateral 10/08/2018   Breast cancer (HCC)    GERD (gastroesophageal reflux disease)    H/O hiatal hernia    Headache    pt states she has had headaches for about 6 months "off and on"   Heart murmur    pt had echocardiogram on 09/17/21   Hypercholesterolemia 10/08/2018   Hypertension    Hypothyroidism    Myasthenia gravis (HCC)    "in my eyes; diagnsosed > 7 yr ago" (03/13/2013)   Sleep apnea    on CPAP   Stroke (HCC) 2021   Thyroid carcinoma (HCC)    Type II diabetes mellitus (HCC)     SURGICAL HISTORY: Past Surgical History:  Procedure Laterality Date   ABDOMINAL HYSTERECTOMY     ANTERIOR CERVICAL DECOMP/DISCECTOMY FUSION     "I've had severa ORs; always went in from the front" (03/13/2013)   APPENDECTOMY     BREAST LUMPECTOMY WITH RADIOACTIVE SEED AND SENTINEL LYMPH NODE BIOPSY Left 09/02/2022   Procedure: LEFT BREAST LUMPECTOMY WITH RADIOACTIVE SEED AND SENTINEL LYMPH NODE  BIOPSY;  Surgeon: Abigail Miyamoto, MD;  Location: [facility] OR;  Service: General;  Laterality: Left;   CARDIAC CATHETERIZATION     "several" (03/13/2013)   CARPAL TUNNEL RELEASE Right    CATARACT EXTRACTION W/ INTRAOCULAR LENS  IMPLANT, BILATERAL Bilateral    CHOLECYSTECTOMY     KNEE ARTHROSCOPY Left    SHOULDER ARTHROSCOPY W/ ROTATOR CUFF REPAIR Left    TONSILLECTOMY     TOTAL THYROIDECTOMY     TRANSCAROTID ARTERY  REVASCULARIZATION  Left 09/24/2021   Procedure: LEFT TRANSCAROTID ARTERY REVASCULARIZATION;  Surgeon: Leonie Douglas, MD;  Location: [facility] OR;  Service: Vascular;  Laterality: Left;   TRANSCAROTID ARTERY REVASCULARIZATION  Right 10/23/2021   Procedure: Right Transcarotid Artery Revascularization;  Surgeon: Leonie Douglas, MD;  Location: [facility] OR;  Service: Vascular;  Laterality: Right;   ULTRASOUND GUIDANCE FOR VASCULAR ACCESS Right 09/24/2021   Procedure: ULTRASOUND GUIDANCE FOR VASCULAR ACCESS;  Surgeon: Leonie Douglas, MD;  Location: [facility] OR;  Service: Vascular;  Laterality: Right;   ULTRASOUND GUIDANCE FOR VASCULAR ACCESS Right 10/23/2021   Procedure: ULTRASOUND GUIDANCE FOR VASCULAR ACCESS, LEFT FEMORAL VEIN;  Surgeon: Leonie Douglas, MD;  Location: [facility] OR;  Service: Vascular;  Laterality: Right;    I have reviewed the social history and family history with the patient and they are unchanged from previous note.  ALLERGIES:  is allergic to contrast media [iodinated contrast media], fluorescein, iodine, magnesium-containing compounds, molds & smuts, shellfish-derived products, pholcodine, azithromycin, codeine, metformin hcl er, oxycodone, tramadol hcl, and other.  MEDICATIONS:  Current Outpatient Medications  Medication Sig Dispense Refill   acetaminophen (TYLENOL) 325 MG tablet Take 1-2 tablets (325-650 mg total) by mouth every 4 (four) hours as needed for mild pain (pain score 1-3).     clopidogrel (PLAVIX) 75 MG tablet Take 1 tablet (75 mg total) by mouth daily. 30 tablet 1   diphenoxylate-atropine (LOMOTIL) 2.5-0.025 MG tablet Take 1-2 tablets by mouth 4 (four) times daily as needed for diarrhea or loose stools. 30 tablet 2   docusate sodium (COLACE) 100 MG capsule Take 1 capsule (100 mg total) by mouth daily. 30 capsule 0   ELDERBERRY PO Take 1 tablet by mouth daily.     empagliflozin (JARDIANCE) 25 MG TABS tablet Take 25 mg by mouth daily.     Evolocumab with Infusor (REPATHA  PUSHTRONEX SYSTEM) 420 MG/3.5ML SOCT Inject 420 mg into the skin every 30 (thirty) days.     exemestane (AROMASIN) 25 MG tablet Take 1 tablet (25 mg total) by mouth daily after breakfast. 30 tablet 5   ezetimibe (ZETIA) 10 MG tablet TAKE 1 TABLET BY MOUTH EVERY DAY 90 tablet 1   Immune Globulin, Human, 40 GM/400ML SOLN Inject into the vein every 6 (six) weeks. Infusions are administered at home by home health nurse - last infusion 09/08/21 and 09/09/21; next infusion due 10/19/21 and 10/20/21     insulin lispro (HUMALOG) 100 UNIT/ML injection Inject into the skin See admin instructions. Inject subcutaneously 3 (three) times daily with meals With meals through the Omnipod Dash Pump every 72 hours as directed     levothyroxine (SYNTHROID) 100 MCG tablet Take 1 tablet (100 mcg total) by mouth daily at 6 (six) AM. (Patient taking differently: Take 200 mcg by mouth daily at 6 (six) AM.)     lidocaine-prilocaine (EMLA) cream Apply to affected area once 30 g 3   loperamide (IMODIUM A-D) 2 MG tablet Take 2 mg by mouth 4 (four) times daily as needed  for diarrhea or loose stools.     loratadine (CLARITIN) 10 MG tablet Take 1 tablet (10 mg total) by mouth at bedtime. 30 tablet 0   meclizine (ANTIVERT) 25 MG tablet Take 25 mg by mouth 3 (three) times daily as needed for dizziness or nausea (or migraine-related nausea).     montelukast (SINGULAIR) 10 MG tablet Take 1 tablet (10 mg total) by mouth every morning. 30 tablet 0   mycophenolate (CELLCEPT) 500 MG tablet Take 1,000 mg by mouth 2 (two) times daily. For myasthenia gravis     ondansetron (ZOFRAN) 8 MG tablet TAKE 1 TABLET BY MOUTH EVERY 8  HOURS AS NEEDED FOR NAUSEA AND  VOMITING 60 tablet 1   pantoprazole (PROTONIX) 40 MG tablet Take 1 tablet (40 mg total) by mouth 2 (two) times daily. 60 tablet 0   predniSONE (DELTASONE) 2.5 MG tablet Take 7.5 mg by mouth every morning. For myasthenia gravis     pregabalin (LYRICA) 25 MG capsule Take 1 capsule (25 mg total) by  mouth 2 (two) times daily. (Patient taking differently: Take 10 mg by mouth 2 (two) times daily.) 180 capsule 0   PRESCRIPTION MEDICATION Pt uses a cpap nightly     PROAIR HFA 108 (90 BASE) MCG/ACT inhaler Inhale 2 puffs into the lungs every 6 (six) hours as needed for wheezing or shortness of breath.      prochlorperazine (COMPAZINE) 10 MG tablet Take 1 tablet (10 mg total) by mouth every 6 (six) hours as needed for nausea or vomiting. 30 tablet 1   psyllium (HYDROCIL/METAMUCIL) 95 % PACK Take 1 packet by mouth daily. 30 each 0   riTUXimab (RITUXAN) 500 MG/50ML injection Inject into the vein every 6 (six) months.     rosuvastatin (CRESTOR) 40 MG tablet Take 1 tablet (40 mg total) by mouth daily. 90 tablet 3   tamoxifen (NOLVADEX) 20 MG tablet Take 1 tablet (20 mg total) by mouth daily. 90 tablet 1   vitamin B-12 (CYANOCOBALAMIN) 1000 MCG tablet Take 1,000 mcg by mouth once a week.     Vitamin D, Ergocalciferol, (DRISDOL) 50000 UNITS CAPS capsule Take 50,000 Units by mouth every Monday.      Exenatide ER (BYDUREON BCISE) 2 MG/0.85ML AUIJ Inject 2 mg into the skin every Monday. (Patient not taking: Reported on 12/02/2023)     No current facility-administered medications for this visit.    PHYSICAL EXAMINATION: ECOG PERFORMANCE STATUS: 2 - Symptomatic, <50% confined to bed  Vitals:   12/02/23 0949  BP: 138/60  Pulse: 98  Resp: 19  Temp: 98.2 F (36.8 C)  SpO2: 99%   Wt Readings from Last 3 Encounters:  12/02/23 199 lb 9.6 oz (90.5 kg)  11/03/23 190 lb 0.6 oz (86.2 kg)  10/29/23 199 lb 9.6 oz (90.5 kg)     GENERAL:alert, no distress and comfortable SKIN: skin color, texture, turgor are normal, no rashes or significant lesions EYES: normal, Conjunctiva are pink and non-injected, sclera clear NECK: supple, thyroid normal size, non-tender, without nodularity LYMPH:  no palpable lymphadenopathy in the cervical, axillary  LUNGS: clear to auscultation and percussion with normal breathing  effort HEART: regular rate & rhythm and no murmurs and no lower extremity edema ABDOMEN:abdomen soft, non-tender and normal bowel sounds Musculoskeletal:no cyanosis of digits and no clubbing  NEURO: alert & oriented x 3 with fluent speech, no focal motor/sensory deficits BREAST: Left breast swollen and dark from previous radiation. Mole in right breast.  No palpable breast mass or adenopathy  Physical Exam   LABORATORY DATA:  I have reviewed the data as listed    Latest Ref Rng & Units 12/02/2023    9:24 AM 11/15/2023    4:24 AM 11/09/2023    3:51 AM  CBC  WBC 4.0 - 10.5 K/uL 3.7  8.1  6.1   Hemoglobin 12.0 - 15.0 g/dL 9.3  8.7  9.1   Hematocrit 36.0 - 46.0 % 29.1  26.8  28.1   Platelets 150 - 400 K/uL 192  232  228         Latest Ref Rng & Units 12/02/2023    9:24 AM 11/15/2023    4:24 AM 11/12/2023    3:38 AM  CMP  Glucose 70 - 99 mg/dL 161  096  045   BUN 8 - 23 mg/dL 15  18  17    Creatinine 0.44 - 1.00 mg/dL 4.09  8.11  9.14   Sodium 135 - 145 mmol/L 140  142  140   Potassium 3.5 - 5.1 mmol/L 3.8  3.4  3.3   Chloride 98 - 111 mmol/L 107  115  110   CO2 22 - 32 mmol/L 25  23  22    Calcium 8.9 - 10.3 mg/dL 8.4  8.2  8.5   Total Protein 6.5 - 8.1 g/dL 7.8     Total Bilirubin 0.0 - 1.2 mg/dL 0.3     Alkaline Phos 38 - 126 U/L 85     AST 15 - 41 U/L 18     ALT 0 - 44 U/L 13         RADIOGRAPHIC STUDIES: I have personally reviewed the radiological images as listed and agreed with the findings in the report. No results found.    Orders Placed This Encounter  Procedures   Ferritin    Standing Status:   Standing    Number of Occurrences:   30    Expiration Date:   12/01/2024   Iron and TIBC    Standing Status:   Future    Expected Date:   03/02/2024    Expiration Date:   12/01/2024   Methylmalonic acid, serum    Standing Status:   Future    Expected Date:   03/02/2024    Expiration Date:   12/01/2024   Vitamin B12    Standing Status:   Future    Expected Date:    03/02/2024    Expiration Date:   12/01/2024   Retic Panel    Standing Status:   Future    Expected Date:   03/02/2024    Expiration Date:   12/01/2024   All questions were answered. The patient knows to call the clinic with any problems, questions or concerns. No barriers to learning was detected. The total time spent in the appointment was 30 minutes.     Malachy Mood, MD 12/02/2023

## 2023-12-03 ENCOUNTER — Other Ambulatory Visit: Payer: Self-pay

## 2023-12-07 ENCOUNTER — Ambulatory Visit: Admitting: Occupational Therapy

## 2023-12-07 ENCOUNTER — Ambulatory Visit: Admitting: Physical Therapy

## 2023-12-07 ENCOUNTER — Encounter: Payer: Self-pay | Admitting: Physical Therapy

## 2023-12-07 VITALS — BP 162/77 | HR 91

## 2023-12-07 DIAGNOSIS — Z170 Estrogen receptor positive status [ER+]: Secondary | ICD-10-CM | POA: Diagnosis not present

## 2023-12-07 DIAGNOSIS — I6350 Cerebral infarction due to unspecified occlusion or stenosis of unspecified cerebral artery: Secondary | ICD-10-CM | POA: Diagnosis not present

## 2023-12-07 DIAGNOSIS — M21371 Foot drop, right foot: Secondary | ICD-10-CM | POA: Diagnosis not present

## 2023-12-07 DIAGNOSIS — M6281 Muscle weakness (generalized): Secondary | ICD-10-CM

## 2023-12-07 DIAGNOSIS — R278 Other lack of coordination: Secondary | ICD-10-CM | POA: Diagnosis not present

## 2023-12-07 DIAGNOSIS — R208 Other disturbances of skin sensation: Secondary | ICD-10-CM | POA: Diagnosis not present

## 2023-12-07 DIAGNOSIS — M79621 Pain in right upper arm: Secondary | ICD-10-CM | POA: Diagnosis not present

## 2023-12-07 DIAGNOSIS — R2689 Other abnormalities of gait and mobility: Secondary | ICD-10-CM | POA: Diagnosis not present

## 2023-12-07 DIAGNOSIS — R2681 Unsteadiness on feet: Secondary | ICD-10-CM | POA: Diagnosis not present

## 2023-12-07 DIAGNOSIS — R29818 Other symptoms and signs involving the nervous system: Secondary | ICD-10-CM | POA: Diagnosis not present

## 2023-12-07 DIAGNOSIS — C50411 Malignant neoplasm of upper-outer quadrant of right female breast: Secondary | ICD-10-CM | POA: Diagnosis not present

## 2023-12-07 DIAGNOSIS — I69351 Hemiplegia and hemiparesis following cerebral infarction affecting right dominant side: Secondary | ICD-10-CM | POA: Diagnosis not present

## 2023-12-07 NOTE — Therapy (Signed)
 OUTPATIENT OCCUPATIONAL THERAPY NEURO TREATMENT  Patient Name: [name] MRN: [id number] DOB:1951-08-18, 73 y.o., female Today's Date: 12/07/2023  PCP: Irena Reichmann, DO REFERRING PROVIDER: Charlton Amor, PA-C  END OF SESSION:  OT End of Session - 12/07/23 0933     Visit Number 2    Number of Visits 12    Authorization Type [employer] 2025    Authorization Time Period VL: [place]    OT Start Time 0933    OT Stop Time 1015    OT Time Calculation (min) 42 min    Equipment Utilized During Treatment Yellow Putty    Activity Tolerance Patient tolerated treatment well    Behavior During Therapy WFL for tasks assessed/performed             Past Medical History:  Diagnosis Date   Arthritis    "all over my body" (03/13/2013)   Asthma    Asymptomatic carotid artery stenosis, bilateral 10/08/2018   Breast cancer (HCC)    GERD (gastroesophageal reflux disease)    H/O hiatal hernia    Headache    pt states she has had headaches for about 6 months "off and on"   Heart murmur    pt had echocardiogram on 09/17/21   Hypercholesterolemia 10/08/2018   Hypertension    Hypothyroidism    Myasthenia gravis (HCC)    "in my eyes; diagnsosed > 7 yr ago" (03/13/2013)   Sleep apnea    on CPAP   Stroke (HCC) 2021   Thyroid carcinoma (HCC)    Type II diabetes mellitus (HCC)    Past Surgical History:  Procedure Laterality Date   ABDOMINAL HYSTERECTOMY     ANTERIOR CERVICAL DECOMP/DISCECTOMY FUSION     "I've had severa ORs; always went in from the front" (03/13/2013)   APPENDECTOMY     BREAST LUMPECTOMY WITH RADIOACTIVE SEED AND SENTINEL LYMPH NODE BIOPSY Left 09/02/2022   Procedure: LEFT BREAST LUMPECTOMY WITH RADIOACTIVE SEED AND SENTINEL LYMPH NODE BIOPSY;  Surgeon: Abigail Miyamoto, MD;  Location: [facility] OR;  Service: General;  Laterality: Left;   CARDIAC CATHETERIZATION     "several" (03/13/2013)   CARPAL TUNNEL RELEASE Right    CATARACT EXTRACTION W/ INTRAOCULAR LENS  IMPLANT,  BILATERAL Bilateral    CHOLECYSTECTOMY     KNEE ARTHROSCOPY Left    SHOULDER ARTHROSCOPY W/ ROTATOR CUFF REPAIR Left    TONSILLECTOMY     TOTAL THYROIDECTOMY     TRANSCAROTID ARTERY REVASCULARIZATION  Left 09/24/2021   Procedure: LEFT TRANSCAROTID ARTERY REVASCULARIZATION;  Surgeon: Leonie Douglas, MD;  Location: [facility] OR;  Service: Vascular;  Laterality: Left;   TRANSCAROTID ARTERY REVASCULARIZATION  Right 10/23/2021   Procedure: Right Transcarotid Artery Revascularization;  Surgeon: Leonie Douglas, MD;  Location: [facility] OR;  Service: Vascular;  Laterality: Right;   ULTRASOUND GUIDANCE FOR VASCULAR ACCESS Right 09/24/2021   Procedure: ULTRASOUND GUIDANCE FOR VASCULAR ACCESS;  Surgeon: Leonie Douglas, MD;  Location: [facility] OR;  Service: Vascular;  Laterality: Right;   ULTRASOUND GUIDANCE FOR VASCULAR ACCESS Right 10/23/2021   Procedure: ULTRASOUND GUIDANCE FOR VASCULAR ACCESS, LEFT FEMORAL VEIN;  Surgeon: Leonie Douglas, MD;  Location: [facility] OR;  Service: Vascular;  Laterality: Right;   Patient Active Problem List   Diagnosis Date Noted   Nausea and vomiting 11/06/2023   Leukocytosis 11/06/2023   SIRS (systemic inflammatory response syndrome) (HCC) 11/06/2023   Right pontine stroke (HCC) 11/02/2023   Stroke (HCC) 10/29/2023   Peripheral neuropathy 11/03/2022   Port-A-Cath in  place 10/21/2022   S/P breast lumpectomy 09/02/2022   S/P lumpectomy, left breast 09/02/2022   Malignant neoplasm of upper-outer quadrant of left breast in female, estrogen receptor positive (HCC) 08/10/2022   Asymptomatic carotid artery stenosis without infarction, right 10/23/2021   Carotid stenosis 10/23/2021   Stroke-like symptoms    TIA (transient ischemic attack)    Hypokalemia 09/17/2021   Right facial numbness 09/16/2021   Insulin dependent type 2 diabetes mellitus (HCC) 09/16/2021   Hypothyroidism 09/16/2021   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HCC) 12/27/2019   Primary hypertension    Uncomplicated asthma     Uncontrolled type 2 diabetes mellitus with hyperglycemia (HCC)    Thalamic stroke (HCC) 12/20/2019   Bilateral carotid artery stenosis 10/08/2018   Hyperlipidemia associated with type 2 diabetes mellitus (HCC) 10/08/2018   Headache around the eyes 11/12/2016   Cervicalgia of occipito-atlanto-axial region 11/12/2016   Moderate persistent asthma 07/29/2015   Current use of beta blocker 07/29/2015   Gastroesophageal reflux disease without esophagitis 07/29/2015   OSA on CPAP 09/19/2014   Diabetic neuropathy with neurologic complication (HCC) 09/19/2014   Myasthenia gravis in remission (HCC) 09/19/2014   Dyspnea 08/13/2014   Erb-Goldflam disease (HCC) 07/27/2014   Cancer of thyroid (HCC) 07/27/2014   OSA (obstructive sleep apnea) 07/24/2014   DM (diabetes mellitus) type II uncontrolled with eye manifestation (HCC) 06/01/2014   Follicular thyroid cancer (HCC) 06/01/2014   Nocturia more than twice per night 06/01/2014   Snoring 06/01/2014   Insomnia due to medical condition 06/01/2014   Neuroma 01/16/2014   Pseudoclaudication 11/14/2013   Heart palpitations 06/30/2013   Sinus tachycardia 03/29/2013   Myasthenia gravis (HCC) 03/13/2013   Essential hypertension, benign 03/13/2013   Asthma, chronic 03/13/2013    ONSET DATE: Referral: 11/16/2023 Hospitalized beginning 10/29/23  REFERRING DIAG: I63.50 (ICD-10-CM) - Cerebral infarction due to unspecified occlusion or stenosis of unspecified cerebral artery  THERAPY DIAG:  Other lack of coordination  Muscle weakness (generalized)  Other disturbances of skin sensation  Other symptoms and signs involving the nervous system  Rationale for Evaluation and Treatment: Rehabilitation  SUBJECTIVE:   SUBJECTIVE STATEMENT:  Pt accompanied by: self  Pt reports her arm was not hurting today.  She had a headache but took medication this morning already. Pt reports she has been having a lot of double vision lately.  PERTINENT HISTORY:    Presented to the ED [facility] on 10/29/2023 with right facial, upper and lower extremity numbness. Reported new onset of dizziness for approximately one week prior to presentation at ED. MRI was positive for 5 mm acute infarct within the dorsal pons on the right.   PMHx: prior left hemispheric TIA and left thalamic infarct, bilateral ICA stents, chronic headaches, diabetes mellitus, hypothyroidism, hypertension and diagnosis of breast cancer August, 2024. H/O ocular myasthenia gravis and a history of thyroid cancer status post radiation and total thyroidectomy. She has an insulin pump which will be reinitiated on admission to CIR.  Inpatient rehab OT note:  Patient has met 9 of 9 long term goals due to improved activity tolerance, improved balance, ability to compensate for deficits, functional use of  RIGHT upper and RIGHT lower extremity, and improved coordination.  Patient to discharge at overall Modified Independent level.  Patient's care partner is independent to provide the necessary physical assistance at discharge. Pt's sisters plan to accompany pt at DC for first several days/weeks and attended education regarding functional transfer and ADL recommendations. Recommendation:  Patient will benefit from ongoing skilled OT  services in outpatient setting to continue to advance functional skills in the area of BADL, iADL, and restore RUE functional use .  PRECAUTIONS: Fall  WEIGHT BEARING RESTRICTIONS: No  PAIN:  Are you having pain? No - headache today - took tylenol already  FALLS: Has patient fallen in last 6 months? No  LIVING ENVIRONMENT: Lives with: lives alone Lives in: House/apartment Stairs: Yes: External: 2 steps; has ramp to cover steps, home is 1 level Has following equipment at home: Otho Blitz - 2 wheeled, Shower bench, and Grab bars  PLOF: Independent with basic ADLs, Independent with household mobility with device, Independent with community mobility with device, and  Needs assistance with homemaking (nephew came ~ every 2 weeks)  PATIENT GOALS: Use right arm better and decreased pain in arm  OBJECTIVE:  Note: Objective measures were completed at Evaluation unless otherwise noted.  HAND DOMINANCE: Right  ADLs:  Overall ADLs: Mod Ind Transfers/ambulation related to ADLs: AE - Pt is using a leg lifter to get her R leg in/out of bed and RW for trnasfers Eating: Ind Grooming: Ind UB Dressing: Ind LB Dressing: Uses grabber to help Toileting: Mod Ind Bathing: Mod Ind Tub Shower transfers: Mod Ind Equipment: Emergency planning/management officer, Reacher, and Long handled sponge  IADLs: Shopping: Gives list to her sister Light housekeeping: Nephew was coming every 2 weeks Meal Prep: Uses stove to cook - not microwave Community mobility: RW Medication management: Uses pillbox and sorts it herself Landscape architect: Ind Handwriting: 90% legible  MOBILITY STATUS: Independent  POSTURE COMMENTS:  rounded shoulders and forward head Sitting balance: WFL  ACTIVITY TOLERANCE: Activity tolerance: Good  FUNCTIONAL OUTCOME MEASURES: Quick Dash: 29.5  UPPER EXTREMITY ROM:    Active ROM Right eval Left eval  Shoulder flexion <90 WFL  Shoulder abduction    Shoulder adduction    Shoulder extension    Shoulder internal rotation Limited   Shoulder external rotation    Elbow flexion Gets it there but slow   Elbow extension    Wrist flexion    Wrist extension    Wrist ulnar deviation    Wrist radial deviation    Wrist pronation    Wrist supination    (Blank rows = not tested)  UPPER EXTREMITY MMT:     MMT Right eval Left eval  Shoulder flexion    Shoulder abduction    Shoulder adduction    Shoulder extension    Shoulder internal rotation    Shoulder external rotation    Middle trapezius    Lower trapezius    Elbow flexion    Elbow extension    Wrist flexion    Wrist extension    Wrist ulnar deviation    Wrist radial deviation    Wrist  pronation    Wrist supination    (Blank rows = not tested)  HAND FUNCTION: Grip strength: Right: 16.5, 17.6, 18.5 lbs; Left: 59.3, 61.9, 66.1 lbs Average: Right: 17.5 lbs Left: 62.4 lbs  COORDINATION: 9 Hole Peg test: Right: 23.89 sec; Left: 27.68 sec Box and Blocks:  Right 48 blocks, Left 51 blocks - hurts a little/tired R hand shakes/tremulous  SENSATION: Light touch: Impaired Pt reported to P.T. that  she had a lot of burning tingling in her legs at the hospital, thinks it was a side effect of a medication because it has gotten better since being home. Still has numbness of her hands   L side feels normal, R side is dull and the R pinkie  is completely numb up into the hand some  Sensation per recent hospitalization:  Light Touch Impaired Details: Impaired RLE Hot/Cold: Not tested Proprioception: Appears Intact Stereognosis: Not tested Additional Comments: decreased sensation along RLE with tingling in R lower leg and foot. Coordination Gross Motor Movements are Fluid and Coordinated: No Fine Motor Movements are Fluid and Coordinated: Yes Coordination and Movement Description: grossly uncoordinated due to R hemiparesis with visual impairments Finger Nose Finger Test: slow Heel Shin Test: unable to perform on RLE, slow and decresaed ROM on LLE  EDEMA: NA  MUSCLE TONE: WFL  COGNITION: Overall cognitive status:  No changes by pt report BIMS Summary Score: 15  VISION: Subjective report: Pt reports double vision and floaters for years d/t Mysathenia gravis in eyes, hasn't drivine since first stroke in 2020 Baseline vision: Bifocals Visual history: cataracts and myasthenia gravis and caratact sx both  VISION ASSESSMENT: Not tested  Patient has difficulty with following activities due to following visual impairments: has double vision  PERCEPTION: Not tested  PRAXIS: Not tested  OBSERVATIONS: Pt ambulates with RW with no loss of balance. The pt is well kept.  OTR  notes R hand shaking and tremulous with UE coordination testing.                                                                                                                          TREATMENT DATE: 12/07/23   Therapeutic Activities:   Initiated Putty Exercises with yellow putty to begin strengthening, coordination and sensory stimulation of B UEs - particularly RUE.  Patient provided visual demonstration, verbal and tactile cues as needed to improve performance of the various exercises/activities including:   - Putty Squeezes - cues to squeeze putty into log for use with other exercises and to fold putty in half with 1 hand with extra effort to do this with RUE alone  - Putty Rolls - encourage to roll putty into logs with sensory stimulation to entire length of hand, fingers and wrist as needed due to numbness in fingertips and dull feeling on R hand    - Pinch and Pull with Putty - this motion is combined with different pinches (3-Point Pinch, Tip Pinch, Key Pinch) - patient encouraged to combine tripod, pincer and/or key pinch with "pinch and pull" motion of putty pulling away from midline, changing between different pinches and changing different directions to change grip   -Finger Extension with Putty - pt shown how to work on task with all fingers and thumb as well as individual fingers in opposition to thumb  - Finger Adduction with Putty - pt shown how to work on weaving putty between her fingers/thumb and then squeeze fingers together while laying flat on table top.  - Removing Objects from Putty  - encouraged to hide items (coins, marble, dice etc) and use one hand at a time to find the objects and identify them by tactile input before s/he digs them out and can see them  visually.  OT educated patient on theraputty recommendations: avoid hot environments, place in designated container, avoid contact with fabrics. Patient verbalized understanding.    Patient benefited from extra  time, verbal/tactile cues, and modeling of task to allow time for processing of verbal instructions and improve motor planning of unfamiliar movements.   PATIENT EDUCATION: Education details: Putty Activities Person educated: Patient Education method: Explanation, Demonstration, Tactile cues, Verbal cues, and Handouts Education comprehension: verbalized understanding, returned demonstration, verbal cues required, tactile cues required, and needs further education  HOME EXERCISE PROGRAM: 11/24/23: Tendon Gliding Exercises 12/07/23: Putty Activities Access Code: [email or web address]  GOALS: Goals reviewed with patient? Yes   LONG TERM GOALS: Target date: 12/31/23  Patient will demonstrate updated R UE HEP with visual handouts only for proper execution. Baseline: New to outpt OT  Goal status: IN progress - issued tendon glides at eval  2.  Patient will demonstrate at least 20+ lbs RUE grip strength as needed to open jars and other containers. Baseline: Right: 17.5 lbs Left: 62.4 lbs Goal status: IN Progress  3.  Pt will verbalize understanding of adapted strategies and/or equipment to address tremors of RUE with ADLs and IADLs. Baseline: New to outpt OT  Goal status: IN Progress  4.  Pt will be able to place at least 50+ blocks using right hand with completion of Box and Blocks test without discomfort. Baseline:  Right 48 blocks - hurts/tired, Left 51 blocks  Goal status: IN Progress  5. Pt will independently recall the 5 main sensory precautions (cold, heat, sharp, chemical, and heavy) as needed to prevent injury/harm secondary to impairments.    Baseline: R UE dull and numb  Goal Status: INITIAL  6.  Patient will demonstrate any improvement with quick Dash score (reporting 25% disability or less) indicating improved functional use of right arm better and decreased pain in RUE. Baseline: 29.5 Goal status: INITIAL  ASSESSMENT:  CLINICAL IMPRESSION: Patient is a 73 y.o. female who was seen  today for occupational therapy treatment for RUE dysfunction s/p CVA. Patient presents below baseline level of RUE comfort, ROM, coordination and function affecting daily activities but responded well to HEP ideas today with minimal tremors noted today. Pt will benefit from continued skilled OT services in the outpatient setting to work on impairments as noted below to help pt return to PLOF as able.    PERFORMANCE DEFICITS: in functional skills including ADLs, IADLs, coordination, dexterity, proprioception, sensation, edema, tone, ROM, strength, pain, fascial restrictions, muscle spasms, flexibility, Fine motor control, Gross motor control, mobility, balance, decreased knowledge of precautions, decreased knowledge of use of DME, vision, and UE functional use, cognitive skills including problem solving, and psychosocial skills including coping strategies, environmental adaptation, and routines and behaviors.   IMPAIRMENTS: are limiting patient from ADLs, IADLs, and leisure.   CO-MORBIDITIES: has co-morbidities such as breast cancer and prior stroke  that affects occupational performance. Patient will benefit from skilled OT to address above impairments and improve overall function.  REHAB POTENTIAL: Good  PLAN:  OT FREQUENCY: 1-2x/week  OT DURATION: 4 weeks  PLANNED INTERVENTIONS: 97535 self care/ADL training, 29562 therapeutic exercise, 97530 therapeutic activity, 97112 neuromuscular re-education, 97140 manual therapy, 97035 ultrasound, passive range of motion, balance training, energy conservation, coping strategies training, patient/family education, and DME and/or AE instructions  RECOMMENDED OTHER SERVICES: Pt receiving PT treatment also  CONSULTED AND AGREED WITH PLAN OF CARE: Patient  PLAN FOR NEXT SESSION:  UE ROM program - table top slides/stretches, manual techniques and pain  management for RUE Monitor tremors for modifications with coordination activities HEPs -  putty/coordination  Diplopia HEP/modifications/suggestions (ocular MG)  Appts: Tuesday AM better than Thursday/late appts - may need some changes to schedule  Zora Hires, OT 12/07/2023, 6:30 PM

## 2023-12-07 NOTE — Patient Instructions (Addendum)
 Access Code: [email or web address] URL: [email or web address] Date: 12/07/2023 Prepared by: Sudie Ely  Exercises - Putty Squeezes  - 1-2 x daily - 10 reps - Rolling Putty on Table  - 1-2 x daily - 10 reps - Finger Pinch and Pull with Putty  - 1-2 x daily - 10 reps - 3-Point Pinch with Putty  - 1-2 x daily - 10 reps - Tip PUSH with Putty  - 1-2 x daily - 10 reps - Key Pinch with Putty  - 1-2 x daily - 10 reps - Finger Extension with Putty  - 1-2 x daily - 10 reps - Finger Adduction with Putty  - 1-2 x daily - 10 reps - Removing Marbles from Putty  - 1-2 x daily - 10 reps

## 2023-12-07 NOTE — Therapy (Addendum)
 OUTPATIENT PHYSICAL THERAPY NEURO TREATMENT   Patient Name: SHAMERIA TRIMARCO MRN: [id number] DOB:11-05-1950, 73 y.o., female Today's Date: 12/07/2023   PCP: Pete Brand, DO REFERRING PROVIDER: Sterling Eisenmenger, PA-C  END OF SESSION:  PT End of Session - 12/07/23 0850     Visit Number 2    Number of Visits 9    Date for PT Re-Evaluation 12/29/23    Authorization Type [employer] Medicare    PT Start Time 0847    PT Stop Time 0928    PT Time Calculation (min) 41 min    Equipment Utilized During Treatment Gait belt    Activity Tolerance Treatment limited secondary to medical complications (Comment)    Behavior During Therapy [facility] for tasks assessed/performed             Past Medical History:  Diagnosis Date   Arthritis    "all over my body" (03/13/2013)   Asthma    Asymptomatic carotid artery stenosis, bilateral 10/08/2018   Breast cancer (HCC)    GERD (gastroesophageal reflux disease)    H/O hiatal hernia    Headache    pt states she has had headaches for about 6 months "off and on"   Heart murmur    pt had echocardiogram on 09/17/21   Hypercholesterolemia 10/08/2018   Hypertension    Hypothyroidism    Myasthenia gravis (HCC)    "in my eyes; diagnsosed > 7 yr ago" (03/13/2013)   Sleep apnea    on CPAP   Stroke (HCC) 2021   Thyroid carcinoma (HCC)    Type II diabetes mellitus (HCC)    Past Surgical History:  Procedure Laterality Date   ABDOMINAL HYSTERECTOMY     ANTERIOR CERVICAL DECOMP/DISCECTOMY FUSION     "I've had severa ORs; always went in from the front" (03/13/2013)   APPENDECTOMY     BREAST LUMPECTOMY WITH RADIOACTIVE SEED AND SENTINEL LYMPH NODE BIOPSY Left 09/02/2022   Procedure: LEFT BREAST LUMPECTOMY WITH RADIOACTIVE SEED AND SENTINEL LYMPH NODE BIOPSY;  Surgeon: Oza Blumenthal, MD;  Location: [facility] OR;  Service: General;  Laterality: Left;   CARDIAC CATHETERIZATION     "several" (03/13/2013)   CARPAL TUNNEL RELEASE Right    CATARACT EXTRACTION W/  INTRAOCULAR LENS  IMPLANT, BILATERAL Bilateral    CHOLECYSTECTOMY     KNEE ARTHROSCOPY Left    SHOULDER ARTHROSCOPY W/ ROTATOR CUFF REPAIR Left    TONSILLECTOMY     TOTAL THYROIDECTOMY     TRANSCAROTID ARTERY REVASCULARIZATION  Left 09/24/2021   Procedure: LEFT TRANSCAROTID ARTERY REVASCULARIZATION;  Surgeon: Carlene Che, MD;  Location: [facility] OR;  Service: Vascular;  Laterality: Left;   TRANSCAROTID ARTERY REVASCULARIZATION  Right 10/23/2021   Procedure: Right Transcarotid Artery Revascularization;  Surgeon: Carlene Che, MD;  Location: [facility] OR;  Service: Vascular;  Laterality: Right;   ULTRASOUND GUIDANCE FOR VASCULAR ACCESS Right 09/24/2021   Procedure: ULTRASOUND GUIDANCE FOR VASCULAR ACCESS;  Surgeon: Carlene Che, MD;  Location[facility] OR;  Service: Vascular;  Laterality: Right;   ULTRASOUND GUIDANCE FOR VASCULAR ACCESS Right 10/23/2021   Procedure: ULTRASOUND GUIDANCE FOR VASCULAR ACCESS, LEFT FEMORAL VEIN;  Surgeon: Carlene Che, MD;  Location: [facility] OR;  Service: Vascular;  Laterality: Right;   Patient Active Problem List   Diagnosis Date Noted   Nausea and vomiting 11/06/2023   Leukocytosis 11/06/2023   SIRS (systemic inflammatory response syndrome) (HCC) 11/06/2023   Right pontine stroke (HCC) 11/02/2023   Stroke (HCC) 10/29/2023   Peripheral neuropathy 11/03/2022  Port-A-Cath in place 10/21/2022   S/P breast lumpectomy 09/02/2022   S/P lumpectomy, left breast 09/02/2022   Malignant neoplasm of upper-outer quadrant of left breast in female, estrogen receptor positive (HCC) 08/10/2022   Asymptomatic carotid artery stenosis without infarction, right 10/23/2021   Carotid stenosis 10/23/2021   Stroke-like symptoms    TIA (transient ischemic attack)    Hypokalemia 09/17/2021   Right facial numbness 09/16/2021   Insulin dependent type 2 diabetes mellitus (HCC) 09/16/2021   Hypothyroidism 09/16/2021   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HCC) 12/27/2019   Primary hypertension     Uncomplicated asthma    Uncontrolled type 2 diabetes mellitus with hyperglycemia (HCC)    Thalamic stroke (HCC) 12/20/2019   Bilateral carotid artery stenosis 10/08/2018   Hyperlipidemia associated with type 2 diabetes mellitus (HCC) 10/08/2018   Headache around the eyes 11/12/2016   Cervicalgia of occipito-atlanto-axial region 11/12/2016   Moderate persistent asthma 07/29/2015   Current use of beta blocker 07/29/2015   Gastroesophageal reflux disease without esophagitis 07/29/2015   OSA on CPAP 09/19/2014   Diabetic neuropathy with neurologic complication (HCC) 09/19/2014   Myasthenia gravis in remission (HCC) 09/19/2014   Dyspnea 08/13/2014   Erb-Goldflam disease (HCC) 07/27/2014   Cancer of thyroid (HCC) 07/27/2014   OSA (obstructive sleep apnea) 07/24/2014   DM (diabetes mellitus) type II uncontrolled with eye manifestation (HCC) 06/01/2014   Follicular thyroid cancer (HCC) 06/01/2014   Nocturia more than twice per night 06/01/2014   Snoring 06/01/2014   Insomnia due to medical condition 06/01/2014   Neuroma 01/16/2014   Pseudoclaudication 11/14/2013   Heart palpitations 06/30/2013   Sinus tachycardia 03/29/2013   Myasthenia gravis (HCC) 03/13/2013   Essential hypertension, benign 03/13/2013   Asthma, chronic 03/13/2013    ONSET DATE: 11/15/2023 (referral)   REFERRING DIAG: I63.50 (ICD-10-CM) - Cerebral infarction due to unspecified occlusion or stenosis of unspecified cerebral artery  THERAPY DIAG:  Muscle weakness (generalized)  Malignant neoplasm of upper-outer quadrant of right breast in female, estrogen receptor positive (HCC)  Unsteadiness on feet  Other abnormalities of gait and mobility  Rationale for Evaluation and Treatment: Rehabilitation  SUBJECTIVE:                                                                                                                                                                                             SUBJECTIVE  STATEMENT: Pt presents w/RW, and reports that she did follow up with her PCP. Her BP was a little elevated at this visit per patient as well as her HR (cannot recall specific numbers), so her doctor told her to call her after PT to  get back in. PCP was aware that patient had therapy today. Patient also reports that brought personal BP cuff in today. Per patient blood glucose 68 this morning but was upper 100s following drinking a juice prior to therapy.  Pt accompanied by: self  PERTINENT HISTORY: prior left hemispheric TIA and left thalamic infarct, bilateral ICA stents, chronic headaches, diabetes mellitus, hypothyroidism, hypertension and diagnosis of breast cancer August 2024. Presented to the ED on 10/29/2023 with right facial, upper and lower extremity numbness. Reported new onset of dizziness for approximately one week prior to presentation at ED  PAIN:  Are you having pain? Yes: NPRS scale: unrated Pain location: headache and RUE Pain description: Throbbing, achy  Aggravating factors: moving it  Relieving factors: resting   PRECAUTIONS: Fall and Other: Port in R chest   RED FLAGS: None   WEIGHT BEARING RESTRICTIONS: No  FALLS: Has patient fallen in last 6 months? No  LIVING ENVIRONMENT: Lives with: lives alone Lives in: House/apartment Stairs: Yes: External: 2 steps; none Has following equipment at home: Environmental consultant - 2 wheeled, Shower bench, Grab bars, and Ramped entry  PLOF: Requires assistive device for independence  PATIENT GOALS: "I wanna get my foot up without using my hand- I don't want to drag it"   OBJECTIVE:  Note: Objective measures were completed at Evaluation unless otherwise noted.  DIAGNOSTIC FINDINGS: MRI of brain from 10/29/2023  IMPRESSION: 1. 5 mm acute infarct within the dorsal pons on the right. 2. Chronic lacunar infarcts and chronic small ischemic changes elsewhere within the pons, new from the prior brain MRI of 09/17/2021. 3. Chronic lacunar  infarcts within the bilateral thalami, also new from the prior MRI. 4. Few nonspecific punctate chronic microhemorrhages within the supratentorial brain. 5. Mild paranasal sinus disease as described.  COGNITION: Overall cognitive status: Within functional limits for tasks assessed                                                                                                                TREATMENT:    Self-care/home management  Assessed vitals (see above) in R forearm and L forearm given insulin pump and glucose reader; both very elevated but [facility] for therapy; however, advises a light physical session as near cutoff score; recommend follow up with PCP for BP management as BP readings still elevated though patient took medication 3 hours ago, discussed HR reading still [facility] but advise continued monitoring, SPO2 seated: 95-96% and 99% following walking Compared cuff readings as noted:  CLINIC CUFF READINGS:    12/07/2023    9:16 AM 12/07/2023    8:58 AM 12/07/2023    8:54 AM  Vitals with BMI  Systolic 162 168 664  Diastolic 77 83 78  Pulse 91 90 92       Post L arm  Rest R arm   Rest L arm  PERSONAL CUFF READINGS: Personal cuff reading: 160/80 L forearm Personal cuff reading: 148/85 R forearm Post walk test: 147/86 L forearm, pulse 89 bpm  Assessed manual  as well as consistent with clinics automatic readings to ensure most accurate reading Given personal cuff reading inconsistently, advised patient invest in personal automatic BP cuff at home more similar to what in clinic, provided printout of examples and recommend keeping receipt and bringing into clinic for future comparison to ensure that it was calibrated to most accurate setting, patient verbalized understanding  Also advised close glucose monitoring for the hours following therapy as may continue to drop several hours following sessions as activity increases in PT sessions   TherAct:  Held assessing 5xSTS given elevated  readings  [facility] PT Assessment - 12/07/23 0001       Standardized Balance Assessment   Standardized Balance Assessment 10 meter walk test    Five times sit to stand comments  --    10 Meter Walk 0.39   m/s with 2WW           When arriving into session, patient did not reach back to chair to sit, reviewed safety with 2WW including reaching back to chair and pushing up from chair, patient prefers one arm on walker and one on chair but with poor eccentric lower, recommend ongoing work, reviewed 4x throughout session  Discussed AFO: patient reports trialing numerous in hospital but none would work with shoe so now just has toe cap on R shoe, not particularly interested in getting other shoes now due to financial concerns  PATIENT EDUCATION: Education details: BP safety, transfers, follow up with PCP for BP management  Person educated: Patient Education method: Explanation Education comprehension: verbalized understanding  HOME EXERCISE PROGRAM: To be established   GOALS: Goals reviewed with patient? Yes  STG = LTG DUE TO POC LENGTH   LONG TERM GOALS: Target date: 12/22/2023   Pt will be independent with final HEP for improved strength, balance, transfers and gait.  Baseline:  Goal status: INITIAL  2.  5x STS to be assessed and goal updated  Baseline:  Goal status: INITIAL  3.  Patient will improve gait speed to 0.5 m/s with LRAD to indicate improvement to the level of limited community ambulator in order to participate more easily in activities outside of the home.   Baseline: 0.39 m/s with 2WW Goal status: INITIAL  4.  Pt will report ability to bring RLE into/out of bed without use of strap for improved functional RLE strength  Baseline:  Goal status: INITIAL   ASSESSMENT:  CLINICAL IMPRESSION: Emphasis on skilled physical therapy session on monitoring vitals, BP safety recommendations, review on safety with transfers, and discussion of AFO trial. Held AFO trial due  to patient financial concerns and not wanting to pursue alternative footware at this time. Session mildly limited by ongoing elevated BP; patient to follow up with PCP. Home cuff with variable readings and unable to accurately calibrate with clinic cuff in today's session so recommend possible trial of alternative cuff and keeping receipt to compare in future sessions. Patient with very poor eccentric control in transfers and will benefit from work in this area in future sessions. Continue POC.   OBJECTIVE IMPAIRMENTS: Abnormal gait, decreased activity tolerance, decreased balance, decreased endurance, decreased mobility, difficulty walking, decreased strength, impaired sensation, impaired UE functional use, impaired vision/preception, and pain  ACTIVITY LIMITATIONS: carrying, lifting, bending, standing, stairs, and locomotion level  PARTICIPATION LIMITATIONS: driving, shopping, community activity, and yard work  PERSONAL FACTORS: Past/current experiences, Transportation, and 1-2 comorbidities: Previous CVA and double vision  are also affecting patient's functional outcome.   REHAB POTENTIAL: Good  CLINICAL DECISION MAKING: Evolving/moderate complexity  EVALUATION COMPLEXITY: Moderate  PLAN:  PT FREQUENCY: 1-2x/week  PT DURATION: 4 weeks  PLANNED INTERVENTIONS: 97164- PT Re-evaluation, 97110-Therapeutic exercises, 97530- Therapeutic activity, 97112- Neuromuscular re-education, 97535- Self Care, 29562- Manual therapy, [phone]- Gait training, [phone]- Orthotic Fit/training, [phone]- Aquatic Therapy, [phone]- Electrical stimulation (manual), Patient/Family education, Balance training, Stair training, Dry Needling, Joint mobilization, Vestibular training, and DME instructions  PLAN FOR NEXT SESSION: Assess 5x STS a and update goals, monitor vitals, did patient get a new BP cuff for home and can compare accuracy in session, did patient follow up with PCP for BP management, Work on RLE NMR, transfers -  eccentric control and safety with 2WW   AFO: patient with R toe cap, not wanting to pursue at this time due to financial concerns and braces not working with her current shoes when trialed in hospital   Coreen Devoid, PT, DPT 12/07/2023, 10:11 AM

## 2023-12-13 ENCOUNTER — Other Ambulatory Visit: Payer: Self-pay | Admitting: Adult Health

## 2023-12-13 DIAGNOSIS — G43009 Migraine without aura, not intractable, without status migrainosus: Secondary | ICD-10-CM

## 2023-12-14 ENCOUNTER — Other Ambulatory Visit: Payer: Self-pay

## 2023-12-14 ENCOUNTER — Ambulatory Visit: Admitting: Occupational Therapy

## 2023-12-14 ENCOUNTER — Ambulatory Visit: Admitting: Physical Therapy

## 2023-12-14 VITALS — BP 138/69 | HR 85

## 2023-12-14 DIAGNOSIS — M6281 Muscle weakness (generalized): Secondary | ICD-10-CM

## 2023-12-14 DIAGNOSIS — I69351 Hemiplegia and hemiparesis following cerebral infarction affecting right dominant side: Secondary | ICD-10-CM | POA: Diagnosis not present

## 2023-12-14 DIAGNOSIS — R278 Other lack of coordination: Secondary | ICD-10-CM | POA: Diagnosis not present

## 2023-12-14 DIAGNOSIS — Z170 Estrogen receptor positive status [ER+]: Secondary | ICD-10-CM | POA: Diagnosis not present

## 2023-12-14 DIAGNOSIS — M21371 Foot drop, right foot: Secondary | ICD-10-CM | POA: Diagnosis not present

## 2023-12-14 DIAGNOSIS — R208 Other disturbances of skin sensation: Secondary | ICD-10-CM

## 2023-12-14 DIAGNOSIS — R2689 Other abnormalities of gait and mobility: Secondary | ICD-10-CM | POA: Diagnosis not present

## 2023-12-14 DIAGNOSIS — R29818 Other symptoms and signs involving the nervous system: Secondary | ICD-10-CM | POA: Diagnosis not present

## 2023-12-14 DIAGNOSIS — C50411 Malignant neoplasm of upper-outer quadrant of right female breast: Secondary | ICD-10-CM | POA: Diagnosis not present

## 2023-12-14 DIAGNOSIS — M79621 Pain in right upper arm: Secondary | ICD-10-CM | POA: Diagnosis not present

## 2023-12-14 DIAGNOSIS — R2681 Unsteadiness on feet: Secondary | ICD-10-CM | POA: Diagnosis not present

## 2023-12-14 DIAGNOSIS — I6350 Cerebral infarction due to unspecified occlusion or stenosis of unspecified cerebral artery: Secondary | ICD-10-CM | POA: Diagnosis not present

## 2023-12-14 NOTE — Therapy (Unsigned)
 OUTPATIENT OCCUPATIONAL THERAPY NEURO TREATMENT  Patient Name: [name] MRN: [id number] DOB:May 23, 1951, 72 y.o., female Today's Date: 12/14/2023  PCP: Pete Brand, DO REFERRING PROVIDER: Sterling Eisenmenger, PA-C  END OF SESSION:  OT End of Session - 12/14/23 0934     Visit Number 3    Number of Visits 12    Authorization Type [employer] 2025    Authorization Time Period VL: [place]    OT Start Time 0933    OT Stop Time 1015    OT Time Calculation (min) 42 min    Activity Tolerance Patient tolerated treatment well    Behavior During Therapy WFL for tasks assessed/performed             Past Medical History:  Diagnosis Date   Arthritis    "all over my body" (03/13/2013)   Asthma    Asymptomatic carotid artery stenosis, bilateral 10/08/2018   Breast cancer (HCC)    GERD (gastroesophageal reflux disease)    H/O hiatal hernia    Headache    pt states she has had headaches for about 6 months "off and on"   Heart murmur    pt had echocardiogram on 09/17/21   Hypercholesterolemia 10/08/2018   Hypertension    Hypothyroidism    Myasthenia gravis (HCC)    "in my eyes; diagnsosed > 7 yr ago" (03/13/2013)   Sleep apnea    on CPAP   Stroke (HCC) 2021   Thyroid  carcinoma (HCC)    Type II diabetes mellitus (HCC)    Past Surgical History:  Procedure Laterality Date   ABDOMINAL HYSTERECTOMY     ANTERIOR CERVICAL DECOMP/DISCECTOMY FUSION     "I've had severa ORs; always went in from the front" (03/13/2013)   APPENDECTOMY     BREAST LUMPECTOMY WITH RADIOACTIVE SEED AND SENTINEL LYMPH NODE BIOPSY Left 09/02/2022   Procedure: LEFT BREAST LUMPECTOMY WITH RADIOACTIVE SEED AND SENTINEL LYMPH NODE BIOPSY;  Surgeon: Oza Blumenthal, MD;  Location: [facility] OR;  Service: General;  Laterality: Left;   CARDIAC CATHETERIZATION     "several" (03/13/2013)   CARPAL TUNNEL RELEASE Right    CATARACT EXTRACTION W/ INTRAOCULAR LENS  IMPLANT, BILATERAL Bilateral    CHOLECYSTECTOMY     KNEE  ARTHROSCOPY Left    SHOULDER ARTHROSCOPY W/ ROTATOR CUFF REPAIR Left    TONSILLECTOMY     TOTAL THYROIDECTOMY     TRANSCAROTID ARTERY REVASCULARIZATION  Left 09/24/2021   Procedure: LEFT TRANSCAROTID ARTERY REVASCULARIZATION;  Surgeon: Carlene Che, MD;  Location: [facility] OR;  Service: Vascular;  Laterality: Left;   TRANSCAROTID ARTERY REVASCULARIZATION  Right 10/23/2021   Procedure: Right Transcarotid Artery Revascularization;  Surgeon: Carlene Che, MD;  Location: [facility] OR;  Service: Vascular;  Laterality: Right;   ULTRASOUND GUIDANCE FOR VASCULAR ACCESS Right 09/24/2021   Procedure: ULTRASOUND GUIDANCE FOR VASCULAR ACCESS;  Surgeon: Carlene Che, MD;  Location: [facility] OR;  Service: Vascular;  Laterality: Right;   ULTRASOUND GUIDANCE FOR VASCULAR ACCESS Right 10/23/2021   Procedure: ULTRASOUND GUIDANCE FOR VASCULAR ACCESS, LEFT FEMORAL VEIN;  Surgeon: Carlene Che, MD;  Location: [facility] OR;  Service: Vascular;  Laterality: Right;   Patient Active Problem List   Diagnosis Date Noted   Nausea and vomiting 11/06/2023   Leukocytosis 11/06/2023   SIRS (systemic inflammatory response syndrome) (HCC) 11/06/2023   Right pontine stroke (HCC) 11/02/2023   Stroke (HCC) 10/29/2023   Peripheral neuropathy 11/03/2022   Port-A-Cath in place 10/21/2022   S/P breast lumpectomy 09/02/2022  S/P lumpectomy, left breast 09/02/2022   Malignant neoplasm of upper-outer quadrant of left breast in female, estrogen receptor positive (HCC) 08/10/2022   Asymptomatic carotid artery stenosis without infarction, right 10/23/2021   Carotid stenosis 10/23/2021   Stroke-like symptoms    TIA (transient ischemic attack)    Hypokalemia 09/17/2021   Right facial numbness 09/16/2021   Insulin  dependent type 2 diabetes mellitus (HCC) 09/16/2021   Hypothyroidism 09/16/2021   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HCC) 12/27/2019   Primary hypertension    Uncomplicated asthma    Uncontrolled type 2 diabetes mellitus with  hyperglycemia (HCC)    Thalamic stroke (HCC) 12/20/2019   Bilateral carotid artery stenosis 10/08/2018   Hyperlipidemia associated with type 2 diabetes mellitus (HCC) 10/08/2018   Headache around the eyes 11/12/2016   Cervicalgia of occipito-atlanto-axial region 11/12/2016   Moderate persistent asthma 07/29/2015   Current use of beta blocker 07/29/2015   Gastroesophageal reflux disease without esophagitis 07/29/2015   OSA on CPAP 09/19/2014   Diabetic neuropathy with neurologic complication (HCC) 09/19/2014   Myasthenia gravis in remission (HCC) 09/19/2014   Dyspnea 08/13/2014   Erb-Goldflam disease (HCC) 07/27/2014   Cancer of thyroid  (HCC) 07/27/2014   OSA (obstructive sleep apnea) 07/24/2014   DM (diabetes mellitus) type II uncontrolled with eye manifestation (HCC) 06/01/2014   Follicular thyroid  cancer (HCC) 06/01/2014   Nocturia more than twice per night 06/01/2014   Snoring 06/01/2014   Insomnia due to medical condition 06/01/2014   Neuroma 01/16/2014   Pseudoclaudication 11/14/2013   Heart palpitations 06/30/2013   Sinus tachycardia 03/29/2013   Myasthenia gravis (HCC) 03/13/2013   Essential hypertension, benign 03/13/2013   Asthma, chronic 03/13/2013    ONSET DATE: Referral: 11/16/2023 Hospitalized beginning 10/29/23  REFERRING DIAG: I63.50 (ICD-10-CM) - Cerebral infarction due to unspecified occlusion or stenosis of unspecified cerebral artery  THERAPY DIAG:  Other lack of coordination  Muscle weakness (generalized)  Other disturbances of skin sensation  Other symptoms and signs involving the nervous system  Rationale for Evaluation and Treatment: Rehabilitation  SUBJECTIVE:   SUBJECTIVE STATEMENT:  Pt accompanied by: self  Pt reports her arm was not hurting today.  She reports putty activities have been going well this week. She has not had as much double vision as she did last week.  PERTINENT HISTORY:   Presented to the ED [facility] on 10/29/2023  with right facial, upper and lower extremity numbness. Reported new onset of dizziness for approximately one week prior to presentation at ED. MRI was positive for 5 mm acute infarct within the dorsal pons on the right.   PMHx: prior left hemispheric TIA and left thalamic infarct, bilateral ICA stents, chronic headaches, diabetes mellitus, hypothyroidism, hypertension and diagnosis of breast cancer August, 2024. H/O ocular myasthenia gravis and a history of thyroid  cancer status post radiation and total thyroidectomy. She has an insulin  pump which will be reinitiated on admission to CIR.  Inpatient rehab OT note:  Patient has met 9 of 9 long term goals due to improved activity tolerance, improved balance, ability to compensate for deficits, functional use of  RIGHT upper and RIGHT lower extremity, and improved coordination.  Patient to discharge at overall Modified Independent level.  Patient's care partner is independent to provide the necessary physical assistance at discharge. Pt's sisters plan to accompany pt at DC for first several days/weeks and attended education regarding functional transfer and ADL recommendations. Recommendation:  Patient will benefit from ongoing skilled OT services in outpatient setting to continue to advance functional  skills in the area of BADL, iADL, and restore RUE functional use .  PRECAUTIONS: Fall  WEIGHT BEARING RESTRICTIONS: No  PAIN:  Are you having pain? No - headache again today - took tylenol  already  FALLS: Has patient fallen in last 6 months? No  LIVING ENVIRONMENT: Lives with: lives alone Lives in: House/apartment Stairs: Yes: External: 2 steps; has ramp to cover steps, home is 1 level Has following equipment at home: Otho Blitz - 2 wheeled, Shower bench, and Grab bars  PLOF: Independent with basic ADLs, Independent with household mobility with device, Independent with community mobility with device, and Needs assistance with homemaking (nephew came ~  every 2 weeks)  PATIENT GOALS: Use right arm better and decreased pain in arm  OBJECTIVE:  Note: Objective measures were completed at Evaluation unless otherwise noted.  HAND DOMINANCE: Right  ADLs:  Overall ADLs: Mod Ind Transfers/ambulation related to ADLs: AE - Pt is using a leg lifter to get her R leg in/out of bed and RW for trnasfers Eating: Ind Grooming: Ind UB Dressing: Ind LB Dressing: Uses grabber to help Toileting: Mod Ind Bathing: Mod Ind Tub Shower transfers: Mod Ind Equipment: Emergency planning/management officer, Reacher, and Long handled sponge  IADLs: Shopping: Gives list to her sister Light housekeeping: Nephew was coming every 2 weeks Meal Prep: Uses stove to cook - not microwave Community mobility: RW Medication management: Uses pillbox and sorts it herself Landscape architect: Ind Handwriting: 90% legible  MOBILITY STATUS: Independent  POSTURE COMMENTS:  rounded shoulders and forward head Sitting balance: WFL  ACTIVITY TOLERANCE: Activity tolerance: Good  FUNCTIONAL OUTCOME MEASURES: Quick Dash: 29.5  UPPER EXTREMITY ROM:    Active ROM Right eval Left eval  Shoulder flexion <90 WFL  Shoulder abduction    Shoulder adduction    Shoulder extension    Shoulder internal rotation Limited   Shoulder external rotation    Elbow flexion Gets it there but slow   Elbow extension    Wrist flexion    Wrist extension    Wrist ulnar deviation    Wrist radial deviation    Wrist pronation    Wrist supination    (Blank rows = not tested)  UPPER EXTREMITY MMT:     MMT Right eval Left eval  Shoulder flexion    Shoulder abduction    Shoulder adduction    Shoulder extension    Shoulder internal rotation    Shoulder external rotation    Middle trapezius    Lower trapezius    Elbow flexion    Elbow extension    Wrist flexion    Wrist extension    Wrist ulnar deviation    Wrist radial deviation    Wrist pronation    Wrist supination    (Blank rows = not  tested)  HAND FUNCTION: Grip strength: Right: 16.5, 17.6, 18.5 lbs; Left: 59.3, 61.9, 66.1 lbs Average: Right: 17.5 lbs Left: 62.4 lbs  COORDINATION: 9 Hole Peg test: Right: 23.89 sec; Left: 27.68 sec Box and Blocks:  Right 48 blocks, Left 51 blocks - hurts a little/tired R hand shakes/tremulous  SENSATION: Light touch: Impaired Pt reported to P.T. that  she had a lot of burning tingling in her legs at the hospital, thinks it was a side effect of a medication because it has gotten better since being home. Still has numbness of her hands   L side feels normal, R side is dull and the R pinkie is completely numb up into the hand some  Sensation per recent hospitalization:  Light Touch Impaired Details: Impaired RLE Hot/Cold: Not tested Proprioception: Appears Intact Stereognosis: Not tested Additional Comments: decreased sensation along RLE with tingling in R lower leg and foot. Coordination Gross Motor Movements are Fluid and Coordinated: No Fine Motor Movements are Fluid and Coordinated: Yes Coordination and Movement Description: grossly uncoordinated due to R hemiparesis with visual impairments Finger Nose Finger Test: slow Heel Shin Test: unable to perform on RLE, slow and decresaed ROM on LLE  EDEMA: NA  MUSCLE TONE: WFL  COGNITION: Overall cognitive status:  No changes by pt report BIMS Summary Score: 15  VISION: Subjective report: Pt reports double vision and floaters for years d/t Mysathenia gravis in eyes, hasn't drivine since first stroke in 2020 Baseline vision: Bifocals Visual history: cataracts and myasthenia gravis and caratact sx both  VISION ASSESSMENT: Not tested  Patient has difficulty with following activities due to following visual impairments: has double vision  PERCEPTION: Not tested  PRAXIS: Not tested  OBSERVATIONS: Pt ambulates with RW with no loss of balance. The pt is well kept.  OTR notes R hand shaking and tremulous with UE coordination  testing.                                                                                                                          TREATMENT DATE: 12/14/23   Therapeutic Activities:   Coordination Exercise/Activity handout with images provided for various activities to work on B UE finger ROM, dexterity and isolated movements with demonstration and practice, as well as modification, hand over hand guidance and cues throughout to improve technique, digital isolation and ease of performing task.  Tasks included:  Pick up coins, dominoes, buttons, marbles, dried beans/pasta of different sizes ... To place in containers To stack - with guidance to work on include/isolate specific fingers. To pick up items one at a time until patient got 5+ in their hand and then move item from palm to fingertips to release ie) Finger-to-palm then palm-to-finger translation of small items - Options to vary difficulty include using a washcloth under items like coins or using larger items (checkers vs coins or blocks/dominos vs dice) for increased ease of picking up items.  Shuffling, Flipping and dealing cards 1 at a time. -- Setup patient to work on sorting cards, focusing on using index finger with thumb to flip cards or holding deck of cards in palm of hand and push off 1 card at a time from the top of the deck using only thumb    Rotate golf balls (clockwise and counter-clockwise) with forearm pronated and balls on table or supinated and balls in hand.   Twirl pen/cil between fingers. - Encouragement to isolate fingers individually and "twirl" (rotation) or flipping and shift up and down the pen (translation) to get it in position for writing or erasing.    Tear a piece of paper towel and roll it into small balls with fingertips ie) straw wrapping when  eating out.    Patient is encouraged to take breaks, relax arm/shoulder by supporting forearm, minimize compensatory motions and a try different activities  throughout the day/week including games like Gillermina Lacer (for the dice), card games, Connect 4 etc.   Patient benefited from extra time, verbal/tactile cues, and modeling of task to allow time for processing of verbal instructions and improve motor planning of unfamiliar movements.    PATIENT EDUCATION: Education details: Psychiatrist Person educated: Patient Education method: Explanation, Demonstration, Tactile cues, Verbal cues, and Handouts Education comprehension: verbalized understanding, returned demonstration, verbal cues required, tactile cues required, and needs further education  HOME EXERCISE PROGRAM: 11/24/23: Tendon Gliding Exercises 12/07/23: Putty Activities Access Code: [email or web address] 12/14/23: Coordination Activities  GOALS: Goals reviewed with patient? Yes   LONG TERM GOALS: Target date: 12/31/23  Patient will demonstrate updated R UE HEP with visual handouts only for proper execution. Baseline: New to outpt OT  Goal status: IN progress - issued tendon glides at eval  2.  Patient will demonstrate at least 20+ lbs RUE grip strength as needed to open jars and other containers. Baseline: Right: 17.5 lbs Left: 62.4 lbs Goal status: IN Progress  3.  Pt will verbalize understanding of adapted strategies and/or equipment to address tremors of RUE with ADLs and IADLs. Baseline: New to outpt OT  Goal status: IN Progress  4.  Pt will be able to place at least 50+ blocks using right hand with completion of Box and Blocks test without discomfort. Baseline:  Right 48 blocks - hurts/tired, Left 51 blocks  Goal status: IN Progress  5. Pt will independently recall the 5 main sensory precautions (cold, heat, sharp, chemical, and heavy) as needed to prevent injury/harm secondary to impairments.    Baseline: R UE dull and numb  Goal Status: IN Progress  6.  Patient will demonstrate any improvement with quick Dash score (reporting 25% disability or less) indicating improved  functional use of right arm better and decreased pain in RUE. Baseline: 29.5 Goal status: INITIAL  ASSESSMENT:  CLINICAL IMPRESSION: Patient is a 73 y.o. female who was seen today for occupational therapy treatment for RUE dysfunction s/p CVA. Patient presents below baseline level of RUE strength and coordination but reported putty activities went well and responded well to coordination HEP ideas today with minimal tremors noted. Pt will benefit from continued skilled OT services in the outpatient setting to work on impairments as noted below to help pt return to PLOF as able.    PERFORMANCE DEFICITS: in functional skills including ADLs, IADLs, coordination, dexterity, proprioception, sensation, edema, tone, ROM, strength, pain, fascial restrictions, muscle spasms, flexibility, Fine motor control, Gross motor control, mobility, balance, decreased knowledge of precautions, decreased knowledge of use of DME, vision, and UE functional use, cognitive skills including problem solving, and psychosocial skills including coping strategies, environmental adaptation, and routines and behaviors.   IMPAIRMENTS: are limiting patient from ADLs, IADLs, and leisure.   CO-MORBIDITIES: has co-morbidities such as breast cancer and prior stroke  that affects occupational performance. Patient will benefit from skilled OT to address above impairments and improve overall function.  REHAB POTENTIAL: Good  PLAN:  OT FREQUENCY: 1-2x/week  OT DURATION: 4 weeks  PLANNED INTERVENTIONS: 97535 self care/ADL training, 09811 therapeutic exercise, 97530 therapeutic activity, 97112 neuromuscular re-education, 97140 manual therapy, 97035 ultrasound, passive range of motion, balance training, energy conservation, coping strategies training, patient/family education, and DME and/or AE instructions  RECOMMENDED OTHER SERVICES: Pt receiving PT treatment also  CONSULTED AND AGREED  WITH PLAN OF CARE: Patient  PLAN FOR NEXT  SESSION:  UE ROM program - table top slides/stretches, manual techniques and pain management for RUE Monitor tremors for modifications with coordination activities HEPs - putty/coordination  Diplopia HEP/modifications/suggestions (ocular MG)  Appts: Tuesday AM better than Thursday/late appts - may need some changes to schedule  Zora Hires, OT 12/14/2023, 12:23 PM

## 2023-12-14 NOTE — Therapy (Signed)
 OUTPATIENT PHYSICAL THERAPY NEURO TREATMENT   Patient Name: [name] MRN: [id number] DOB:1951-07-12, 73 y.o., female Today's Date: 12/14/2023   PCP: Pete Brand, DO REFERRING PROVIDER: Sterling Eisenmenger, PA-C  END OF SESSION:  PT End of Session - 12/14/23 0906     Visit Number 3    Number of Visits 9    Date for PT Re-Evaluation 12/29/23    Authorization Type [employer] Medicare    PT Start Time 0905   Pt arrived late   PT Stop Time 0929    PT Time Calculation (min) 24 min    Equipment Utilized During Treatment --    Activity Tolerance Patient tolerated treatment well    Behavior During Therapy [facility] for tasks assessed/performed             Past Medical History:  Diagnosis Date   Arthritis    "all over my body" (03/13/2013)   Asthma    Asymptomatic carotid artery stenosis, bilateral 10/08/2018   Breast cancer (HCC)    GERD (gastroesophageal reflux disease)    H/O hiatal hernia    Headache    pt states she has had headaches for about 6 months "off and on"   Heart murmur    pt had echocardiogram on 09/17/21   Hypercholesterolemia 10/08/2018   Hypertension    Hypothyroidism    Myasthenia gravis (HCC)    "in my eyes; diagnsosed > 7 yr ago" (03/13/2013)   Sleep apnea    on CPAP   Stroke (HCC) 2021   Thyroid  carcinoma (HCC)    Type II diabetes mellitus (HCC)    Past Surgical History:  Procedure Laterality Date   ABDOMINAL HYSTERECTOMY     ANTERIOR CERVICAL DECOMP/DISCECTOMY FUSION     "I've had severa ORs; always went in from the front" (03/13/2013)   APPENDECTOMY     BREAST LUMPECTOMY WITH RADIOACTIVE SEED AND SENTINEL LYMPH NODE BIOPSY Left 09/02/2022   Procedure: LEFT BREAST LUMPECTOMY WITH RADIOACTIVE SEED AND SENTINEL LYMPH NODE BIOPSY;  Surgeon: Oza Blumenthal, MD;  Location: [facility] OR;  Service: General;  Laterality: Left;   CARDIAC CATHETERIZATION     "several" (03/13/2013)   CARPAL TUNNEL RELEASE Right    CATARACT EXTRACTION W/ INTRAOCULAR LENS  IMPLANT,  BILATERAL Bilateral    CHOLECYSTECTOMY     KNEE ARTHROSCOPY Left    SHOULDER ARTHROSCOPY W/ ROTATOR CUFF REPAIR Left    TONSILLECTOMY     TOTAL THYROIDECTOMY     TRANSCAROTID ARTERY REVASCULARIZATION  Left 09/24/2021   Procedure: LEFT TRANSCAROTID ARTERY REVASCULARIZATION;  Surgeon: Carlene Che, MD;  Location: [facility] OR;  Service: Vascular;  Laterality: Left;   TRANSCAROTID ARTERY REVASCULARIZATION  Right 10/23/2021   Procedure: Right Transcarotid Artery Revascularization;  Surgeon: Carlene Che, MD;  Location: [facility] OR;  Service: Vascular;  Laterality: Right;   ULTRASOUND GUIDANCE FOR VASCULAR ACCESS Right 09/24/2021   Procedure: ULTRASOUND GUIDANCE FOR VASCULAR ACCESS;  Surgeon: Carlene Che, MD;  Location: [facility] OR;  Service: Vascular;  Laterality: Right;   ULTRASOUND GUIDANCE FOR VASCULAR ACCESS Right 10/23/2021   Procedure: ULTRASOUND GUIDANCE FOR VASCULAR ACCESS, LEFT FEMORAL VEIN;  Surgeon: Carlene Che, MD;  Location: [facility] OR;  Service: Vascular;  Laterality: Right;   Patient Active Problem List   Diagnosis Date Noted   Nausea and vomiting 11/06/2023   Leukocytosis 11/06/2023   SIRS (systemic inflammatory response syndrome) (HCC) 11/06/2023   Right pontine stroke (HCC) 11/02/2023   Stroke (HCC) 10/29/2023   Peripheral neuropathy 11/03/2022  Port-A-Cath in place 10/21/2022   S/P breast lumpectomy 09/02/2022   S/P lumpectomy, left breast 09/02/2022   Malignant neoplasm of upper-outer quadrant of left breast in female, estrogen receptor positive (HCC) 08/10/2022   Asymptomatic carotid artery stenosis without infarction, right 10/23/2021   Carotid stenosis 10/23/2021   Stroke-like symptoms    TIA (transient ischemic attack)    Hypokalemia 09/17/2021   Right facial numbness 09/16/2021   Insulin  dependent type 2 diabetes mellitus (HCC) 09/16/2021   Hypothyroidism 09/16/2021   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HCC) 12/27/2019   Primary hypertension    Uncomplicated asthma     Uncontrolled type 2 diabetes mellitus with hyperglycemia (HCC)    Thalamic stroke (HCC) 12/20/2019   Bilateral carotid artery stenosis 10/08/2018   Hyperlipidemia associated with type 2 diabetes mellitus (HCC) 10/08/2018   Headache around the eyes 11/12/2016   Cervicalgia of occipito-atlanto-axial region 11/12/2016   Moderate persistent asthma 07/29/2015   Current use of beta blocker 07/29/2015   Gastroesophageal reflux disease without esophagitis 07/29/2015   OSA on CPAP 09/19/2014   Diabetic neuropathy with neurologic complication (HCC) 09/19/2014   Myasthenia gravis in remission (HCC) 09/19/2014   Dyspnea 08/13/2014   Erb-Goldflam disease (HCC) 07/27/2014   Cancer of thyroid  (HCC) 07/27/2014   OSA (obstructive sleep apnea) 07/24/2014   DM (diabetes mellitus) type II uncontrolled with eye manifestation (HCC) 06/01/2014   Follicular thyroid  cancer (HCC) 06/01/2014   Nocturia more than twice per night 06/01/2014   Snoring 06/01/2014   Insomnia due to medical condition 06/01/2014   Neuroma 01/16/2014   Pseudoclaudication 11/14/2013   Heart palpitations 06/30/2013   Sinus tachycardia 03/29/2013   Myasthenia gravis (HCC) 03/13/2013   Essential hypertension, benign 03/13/2013   Asthma, chronic 03/13/2013    ONSET DATE: 11/15/2023 (referral)   REFERRING DIAG: I63.50 (ICD-10-CM) - Cerebral infarction due to unspecified occlusion or stenosis of unspecified cerebral artery  THERAPY DIAG:  Muscle weakness (generalized)  Unsteadiness on feet  Other abnormalities of gait and mobility  Rationale for Evaluation and Treatment: Rehabilitation  SUBJECTIVE:                                                                                                                                                                                             SUBJECTIVE STATEMENT: Pt presents w/RW, brought her BP cuff. States she has a headache, took a tylenol  and checked her BP this morning. Was 143  "over something". Is concerned about dragging her RLE. No falls.   Pt accompanied by: self  PERTINENT HISTORY: prior left hemispheric TIA and left thalamic infarct, bilateral ICA stents, chronic headaches, diabetes mellitus, hypothyroidism, hypertension  and diagnosis of breast cancer August 2024. Presented to the ED on 10/29/2023 with right facial, upper and lower extremity numbness. Reported new onset of dizziness for approximately one week prior to presentation at ED  PAIN:  Are you having pain? Yes: NPRS scale: 7/10 Pain location: headache Pain description: Throbbing, achy   PRECAUTIONS: Fall and Other: Port in R chest   RED FLAGS: None   WEIGHT BEARING RESTRICTIONS: No  FALLS: Has patient fallen in last 6 months? No  LIVING ENVIRONMENT: Lives with: lives alone Lives in: House/apartment Stairs: Yes: External: 2 steps; none Has following equipment at home: Environmental consultant - 2 wheeled, Shower bench, Grab bars, and Ramped entry  PLOF: Requires assistive device for independence  PATIENT GOALS: "I wanna get my foot up without using my hand- I don't want to drag it"   OBJECTIVE:  Note: Objective measures were completed at Evaluation unless otherwise noted.  DIAGNOSTIC FINDINGS: MRI of brain from 10/29/2023  IMPRESSION: 1. 5 mm acute infarct within the dorsal pons on the right. 2. Chronic lacunar infarcts and chronic small ischemic changes elsewhere within the pons, new from the prior brain MRI of 09/17/2021. 3. Chronic lacunar infarcts within the bilateral thalami, also new from the prior MRI. 4. Few nonspecific punctate chronic microhemorrhages within the supratentorial brain. 5. Mild paranasal sinus disease as described.  COGNITION: Overall cognitive status: Within functional limits for tasks assessed                                  Vitals:   12/14/23 0910 12/14/23 0914 12/14/23 0930  BP: (!) 168/77 Comment: Pt's home cuff (!) 154/73 138/69  Pulse: 90 86 85                                                                                   TREATMENT:    Self-care/home management  Assessed vitals (see above) in L forearm w/pt's home cuff and systolic BP elevated. Pt reports she is stressed from trying to rush here (pt was late). Reassessed after several minutes of seated rest and systolic BP did reduce  Blood glucose at 107 mg/dL at onset of session.   Ther Act   [facility] PT Assessment - 12/14/23 0917       Transfers   Five time sit to stand comments  21.84s   Intermittent UE support           Established initial HEP (see bolded below) for improved transfers and R hip flexor strength. Pt very challenged by march overs on R side but was able to perform a few reps without using her hands.  Assessed BP at end of session (per pt request) and WNL (see above).     PATIENT EDUCATION: Education details: Initial HEP, next appointment time  Person educated: Patient Education method: Explanation, Demonstration, and Handouts Education comprehension: verbalized understanding and returned demonstration  HOME EXERCISE PROGRAM: Access Code: [email or web address] URL: [email or web address] Date: 12/14/2023 Prepared by: Burleigh Carp Tymber Stallings  Exercises - Sit to Stand Without Arm Support  - 1 x daily - 7 x weekly - 2 sets - 8-10 reps -  Seated march over  - 1 x daily - 7 x weekly - 3 sets - 10 reps  GOALS: Goals reviewed with patient? Yes  STG = LTG DUE TO POC LENGTH   LONG TERM GOALS: Target date: 12/22/2023   Pt will be independent with final HEP for improved strength, balance, transfers and gait.  Baseline:  Goal status: INITIAL  2.  Pt will improve 5 x STS to less than or equal to 18 seconds without UE support to demonstrate improved functional strength and transfer efficiency.   Baseline: 21.84s w/intermittent UE support  Goal status: REVISED  3.  Patient will improve gait speed to 0.5 m/s with LRAD to indicate improvement to the level of limited  community ambulator in order to participate more easily in activities outside of the home.   Baseline: 0.39 m/s with 2WW Goal status: INITIAL  4.  Pt will report ability to bring RLE into/out of bed without use of strap for improved functional RLE strength  Baseline:  Goal status: INITIAL   ASSESSMENT:  CLINICAL IMPRESSION: Session limited as pt arrived late. Emphasis of skilled PT session on monitoring BP and establishing initial HEP for improved RLE strength. Pt brought her home cuff w/her today and initially systolic BP was elevated, but by end of session had reduced to 130s. Pt reports she was stressed initially due to rushing to get here. Pt continues to be most challenged by R hip flexor weakness, resulting in dragging her RLE, but was able to perform a few march overs without UE support. Continue POC.   OBJECTIVE IMPAIRMENTS: Abnormal gait, decreased activity tolerance, decreased balance, decreased endurance, decreased mobility, difficulty walking, decreased strength, impaired sensation, impaired UE functional use, impaired vision/preception, and pain  ACTIVITY LIMITATIONS: carrying, lifting, bending, standing, stairs, and locomotion level  PARTICIPATION LIMITATIONS: driving, shopping, community activity, and yard work  PERSONAL FACTORS: Past/current experiences, Transportation, and 1-2 comorbidities: Previous CVA and double vision  are also affecting patient's functional outcome.   REHAB POTENTIAL: Good  CLINICAL DECISION MAKING: Evolving/moderate complexity  EVALUATION COMPLEXITY: Moderate  PLAN:  PT FREQUENCY: 1-2x/week  PT DURATION: 4 weeks  PLANNED INTERVENTIONS: 97164- PT Re-evaluation, 97110-Therapeutic exercises, 97530- Therapeutic activity, 97112- Neuromuscular re-education, 97535- Self Care, 16109- Manual therapy, [phone]- Gait training, [phone]- Orthotic Fit/training, [phone]- Aquatic Therapy, [phone]- Electrical stimulation (manual), Patient/Family education, Balance  training, Stair training, Dry Needling, Joint mobilization, Vestibular training, and DME instructions  PLAN FOR NEXT SESSION: monitor vitals. Work on RLE [employer], transfers - eccentric control and safety with 2WW, R hip flexion    AFO: patient with R toe cap, not wanting to pursue at this time due to financial concerns and braces not working with her current shoes when trialed in hospital   Blinda Turek E Thomas Mabry, PT, DPT 12/14/2023, 9:47 AM

## 2023-12-14 NOTE — Telephone Encounter (Signed)
 Msg sent to pt

## 2023-12-15 NOTE — Therapy (Unsigned)
 OUTPATIENT OCCUPATIONAL THERAPY NEURO TREATMENT  Patient Name: [name] MRN: [id number] DOB:[birth year], 73 y.o., female Today's Date: 12/16/2023  PCP: Pete Brand, DO REFERRING PROVIDER: Sterling Eisenmenger, PA-C  END OF SESSION:  OT End of Session - 12/16/23 1236     Visit Number 4    Number of Visits 12    Authorization Type [employer] 2025    Authorization Time Period VL: [place]    OT Start Time 1237    OT Stop Time 1315    OT Time Calculation (min) 38 min    Activity Tolerance Patient tolerated treatment well    Behavior During Therapy WFL for tasks assessed/performed            Past Medical History:  Diagnosis Date   Arthritis    "all over my body" (03/13/2013)   Asthma    Asymptomatic carotid artery stenosis, bilateral 10/08/2018   Breast cancer (HCC)    GERD (gastroesophageal reflux disease)    H/O hiatal hernia    Headache    pt states she has had headaches for about 6 months "off and on"   Heart murmur    pt had echocardiogram on 09/17/21   Hypercholesterolemia 10/08/2018   Hypertension    Hypothyroidism    Myasthenia gravis (HCC)    "in my eyes; diagnsosed > 7 yr ago" (03/13/2013)   Sleep apnea    on CPAP   Stroke (HCC) 2021   Thyroid  carcinoma (HCC)    Type II diabetes mellitus (HCC)    Past Surgical History:  Procedure Laterality Date   ABDOMINAL HYSTERECTOMY     ANTERIOR CERVICAL DECOMP/DISCECTOMY FUSION     "I've had severa ORs; always went in from the front" (03/13/2013)   APPENDECTOMY     BREAST LUMPECTOMY WITH RADIOACTIVE SEED AND SENTINEL LYMPH NODE BIOPSY Left 09/02/2022   Procedure: LEFT BREAST LUMPECTOMY WITH RADIOACTIVE SEED AND SENTINEL LYMPH NODE BIOPSY;  Surgeon: Oza Blumenthal, MD;  Location: [facility] OR;  Service: General;  Laterality: Left;   CARDIAC CATHETERIZATION     "several" (03/13/2013)   CARPAL TUNNEL RELEASE Right    CATARACT EXTRACTION W/ INTRAOCULAR LENS  IMPLANT, BILATERAL Bilateral    CHOLECYSTECTOMY     KNEE  ARTHROSCOPY Left    SHOULDER ARTHROSCOPY W/ ROTATOR CUFF REPAIR Left    TONSILLECTOMY     TOTAL THYROIDECTOMY     TRANSCAROTID ARTERY REVASCULARIZATION  Left 09/24/2021   Procedure: LEFT TRANSCAROTID ARTERY REVASCULARIZATION;  Surgeon: Carlene Che, MD;  Location: [facility] OR;  Service: Vascular;  Laterality: Left;   TRANSCAROTID ARTERY REVASCULARIZATION  Right 10/23/2021   Procedure: Right Transcarotid Artery Revascularization;  Surgeon: Carlene Che, MD;  Location: [facility] OR;  Service: Vascular;  Laterality: Right;   ULTRASOUND GUIDANCE FOR VASCULAR ACCESS Right 09/24/2021   Procedure: ULTRASOUND GUIDANCE FOR VASCULAR ACCESS;  Surgeon: Carlene Che, MD;  Location: [facility] OR;  Service: Vascular;  Laterality: Right;   ULTRASOUND GUIDANCE FOR VASCULAR ACCESS Right 10/23/2021   Procedure: ULTRASOUND GUIDANCE FOR VASCULAR ACCESS, LEFT FEMORAL VEIN;  Surgeon: Carlene Che, MD;  Location: [facility] OR;  Service: Vascular;  Laterality: Right;   Patient Active Problem List   Diagnosis Date Noted   Nausea and vomiting 11/06/2023   Leukocytosis 11/06/2023   SIRS (systemic inflammatory response syndrome) (HCC) 11/06/2023   Right pontine stroke (HCC) 11/02/2023   Stroke (HCC) 10/29/2023   Peripheral neuropathy 11/03/2022   Port-A-Cath in place 10/21/2022   S/P breast lumpectomy 09/02/2022  S/P lumpectomy, left breast 09/02/2022   Malignant neoplasm of upper-outer quadrant of left breast in female, estrogen receptor positive (HCC) 08/10/2022   Asymptomatic carotid artery stenosis without infarction, right 10/23/2021   Carotid stenosis 10/23/2021   Stroke-like symptoms    TIA (transient ischemic attack)    Hypokalemia 09/17/2021   Right facial numbness 09/16/2021   Insulin  dependent type 2 diabetes mellitus (HCC) 09/16/2021   Hypothyroidism 09/16/2021   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HCC) 12/27/2019   Primary hypertension    Uncomplicated asthma    Uncontrolled type 2 diabetes mellitus with  hyperglycemia (HCC)    Thalamic stroke (HCC) 12/20/2019   Bilateral carotid artery stenosis 10/08/2018   Hyperlipidemia associated with type 2 diabetes mellitus (HCC) 10/08/2018   Headache around the eyes 11/12/2016   Cervicalgia of occipito-atlanto-axial region 11/12/2016   Moderate persistent asthma 07/29/2015   Current use of beta blocker 07/29/2015   Gastroesophageal reflux disease without esophagitis 07/29/2015   OSA on CPAP 09/19/2014   Diabetic neuropathy with neurologic complication (HCC) 09/19/2014   Myasthenia gravis in remission (HCC) 09/19/2014   Dyspnea 08/13/2014   Erb-Goldflam disease (HCC) 07/27/2014   Cancer of thyroid  (HCC) 07/27/2014   OSA (obstructive sleep apnea) 07/24/2014   DM (diabetes mellitus) type II uncontrolled with eye manifestation (HCC) 06/01/2014   Follicular thyroid  cancer (HCC) 06/01/2014   Nocturia more than twice per night 06/01/2014   Snoring 06/01/2014   Insomnia due to medical condition 06/01/2014   Neuroma 01/16/2014   Pseudoclaudication 11/14/2013   Heart palpitations 06/30/2013   Sinus tachycardia 03/29/2013   Myasthenia gravis (HCC) 03/13/2013   Essential hypertension, benign 03/13/2013   Asthma, chronic 03/13/2013    ONSET DATE: Referral: 11/16/2023 Hospitalized beginning 10/29/23  REFERRING DIAG: I63.50 (ICD-10-CM) - Cerebral infarction due to unspecified occlusion or stenosis of unspecified cerebral artery  THERAPY DIAG:  Muscle weakness (generalized)  Other lack of coordination  Other disturbances of skin sensation  Pain in right upper arm  Other symptoms and signs involving the nervous system  Hemiplegia and hemiparesis following cerebral infarction affecting right dominant side (HCC)  Right pontine stroke (HCC)  Rationale for Evaluation and Treatment: Rehabilitation  SUBJECTIVE:   SUBJECTIVE STATEMENT:  Pt accompanied by: self  Pt reports her RUE pain has been better. She has been doing   PERTINENT HISTORY:    Presented to the ED [facility] on 10/29/2023 with right facial, upper and lower extremity numbness. Reported new onset of dizziness for approximately one week prior to presentation at ED. MRI was positive for 5 mm acute infarct within the dorsal pons on the right.   PMHx: prior left hemispheric TIA and left thalamic infarct, bilateral ICA stents, chronic headaches, diabetes mellitus, hypothyroidism, hypertension and diagnosis of breast cancer August, 2024. H/O ocular myasthenia gravis and a history of thyroid  cancer status post radiation and total thyroidectomy. She has an insulin  pump which will be reinitiated on admission to CIR.  Inpatient rehab OT note:  Patient has met 9 of 9 long term goals due to improved activity tolerance, improved balance, ability to compensate for deficits, functional use of  RIGHT upper and RIGHT lower extremity, and improved coordination.  Patient to discharge at overall Modified Independent level.  Patient's care partner is independent to provide the necessary physical assistance at discharge. Pt's sisters plan to accompany pt at DC for first several days/weeks and attended education regarding functional transfer and ADL recommendations. Recommendation:  Patient will benefit from ongoing skilled OT services in outpatient setting to  continue to advance functional skills in the area of BADL, iADL, and restore RUE functional use .  PRECAUTIONS: Fall  WEIGHT BEARING RESTRICTIONS: No  PAIN:  Are you having pain? Yes: NPRS scale: 6/10  - headache again today - took tylenol  already  FALLS: Has patient fallen in last 6 months? No  LIVING ENVIRONMENT: Lives with: lives alone Lives in: House/apartment Stairs: Yes: External: 2 steps; has ramp to cover steps, home is 1 level Has following equipment at home: Otho Blitz - 2 wheeled, Shower bench, and Grab bars  PLOF: Independent with basic ADLs, Independent with household mobility with device, Independent with community  mobility with device, and Needs assistance with homemaking (nephew came ~ every 2 weeks)  PATIENT GOALS: Use right arm better and decreased pain in arm  OBJECTIVE:  Note: Objective measures were completed at Evaluation unless otherwise noted.  HAND DOMINANCE: Right  ADLs:  Overall ADLs: Mod Ind Transfers/ambulation related to ADLs: AE - Pt is using a leg lifter to get her R leg in/out of bed and RW for trnasfers Eating: Ind Grooming: Ind UB Dressing: Ind LB Dressing: Uses grabber to help Toileting: Mod Ind Bathing: Mod Ind Tub Shower transfers: Mod Ind Equipment: Emergency planning/management officer, Reacher, and Long handled sponge  IADLs: Shopping: Gives list to her sister Light housekeeping: Nephew was coming every 2 weeks Meal Prep: Uses stove to cook - not microwave Community mobility: RW Medication management: Uses pillbox and sorts it herself Landscape architect: Ind Handwriting: 90% legible  MOBILITY STATUS: Independent  POSTURE COMMENTS:  rounded shoulders and forward head Sitting balance: WFL  ACTIVITY TOLERANCE: Activity tolerance: Good  FUNCTIONAL OUTCOME MEASURES: Quick Dash: 29.5  UPPER EXTREMITY ROM:    Active ROM Right eval Left eval  Shoulder flexion <90 WFL  Shoulder abduction    Shoulder adduction    Shoulder extension    Shoulder internal rotation Limited   Shoulder external rotation    Elbow flexion Gets it there but slow   Elbow extension    Wrist flexion    Wrist extension    Wrist ulnar deviation    Wrist radial deviation    Wrist pronation    Wrist supination    (Blank rows = not tested)  HAND FUNCTION: Grip strength: Right: 16.5, 17.6, 18.5 lbs; Left: 59.3, 61.9, 66.1 lbs Average: Right: 17.5 lbs Left: 62.4 lbs  COORDINATION: 9 Hole Peg test: Right: 23.89 sec; Left: 27.68 sec Box and Blocks:  Right 48 blocks, Left 51 blocks - hurts a little/tired R hand shakes/tremulous  SENSATION: Light touch: Impaired Pt reported to P.T. that  she  had a lot of burning tingling in her legs at the hospital, thinks it was a side effect of a medication because it has gotten better since being home. Still has numbness of her hands   L side feels normal, R side is dull and the R pinkie is completely numb up into the hand some  Sensation per recent hospitalization:  Light Touch Impaired Details: Impaired RLE Hot/Cold: Not tested Proprioception: Appears Intact Stereognosis: Not tested Additional Comments: decreased sensation along RLE with tingling in R lower leg and foot. Coordination Gross Motor Movements are Fluid and Coordinated: No Fine Motor Movements are Fluid and Coordinated: Yes Coordination and Movement Description: grossly uncoordinated due to R hemiparesis with visual impairments Finger Nose Finger Test: slow Heel Shin Test: unable to perform on RLE, slow and decresaed ROM on LLE  EDEMA: NA  MUSCLE TONE: WFL  COGNITION: Overall cognitive  status:  No changes by pt report BIMS Summary Score: 15  VISION: Subjective report: Pt reports double vision and floaters for years d/t Mysathenia gravis in eyes, hasn't drivine since first stroke in 2020 Baseline vision: Bifocals Visual history: cataracts and myasthenia gravis and caratact sx both  VISION ASSESSMENT: Not tested  Patient has difficulty with following activities due to following visual impairments: has double vision  PERCEPTION: Not tested  PRAXIS: Not tested  OBSERVATIONS: Pt ambulates with RW with no loss of balance. The pt is well kept.  OTR notes R hand shaking and tremulous with UE coordination testing.                                                                                                                          TREATMENT :    Therapeutic Activities:   OT had pt complete BUE table slides with towel for improved RUE ROM and pain management using a functional task.   - Self-care/home management completed for duration as noted below including: OT  instructed pt to complete migraine journal to help identify certain triggers.  OT initiated diplopia HEP as noted in pt instructions  OT educated pt on use of heat as noted in pt instructions for additional RUE management of pain and ROM.   OT educated pt on use of DME as noted in patient instructions in efforts to reduce the effects of tremor on completion of functional activities.  Handout provided.  Recommendations included: Weighted utensils,wrist weights,Twist 'N Write pencil and Pen Again pen, weightbearing through upper extremities, and bringing items closer to body.   PATIENT EDUCATION: Education details: Tremor reduction strategies, Heat; Diplopia Person educated: Patient Education method: Explanation, Demonstration, Tactile cues, Verbal cues, and Handouts Education comprehension: verbalized understanding, returned demonstration, verbal cues required, tactile cues required, and needs further education  HOME EXERCISE PROGRAM: 11/24/23: Tendon Gliding Exercises 12/07/23: Putty Activities Access Code: [email or web address] 12/16/2023: Heat; Diplopia; Tremor reduction strategies  GOALS: Goals reviewed with patient? Yes   LONG TERM GOALS: Target date: 12/31/23  Patient will demonstrate updated R UE HEP with visual handouts only for proper execution. Baseline: New to outpt OT  Goal status: IN progress - issued tendon glides at eval  2.  Patient will demonstrate at least 20+ lbs RUE grip strength as needed to open jars and other containers. Baseline: Right: 17.5 lbs Left: 62.4 lbs Goal status: IN Progress  3.  Pt will verbalize understanding of adapted strategies and/or equipment to address tremors of RUE with ADLs and IADLs. Baseline: New to outpt OT  Goal status: IN Progress  4.  Pt will be able to place at least 50+ blocks using right hand with completion of Box and Blocks test without discomfort. Baseline:  Right 48 blocks - hurts/tired, Left 51 blocks  Goal status: IN Progress  5. Pt will  independently recall the 5 main sensory precautions (cold, heat, sharp, chemical, and heavy) as needed to prevent injury/harm secondary to impairments.  Baseline: R UE dull and numb  Goal Status: INITIAL  6.  Patient will demonstrate any improvement with quick Dash score (reporting 25% disability or less) indicating improved functional use of right arm better and decreased pain in RUE. Baseline: 29.5 Goal status: INITIAL  ASSESSMENT:  CLINICAL IMPRESSION: Pt verbalizes good understanding of HEP and general recommendations this visit as needed to improve independence and safety with ADL and IADL completion. Unsure if diplopia HEP will lessen double vision, but should still be beneficial in strengthening ocular muscles, which are often affected by Myasthenia Gravis.   PERFORMANCE DEFICITS: in functional skills including ADLs, IADLs, coordination, dexterity, proprioception, sensation, edema, tone, ROM, strength, pain, fascial restrictions, muscle spasms, flexibility, Fine motor control, Gross motor control, mobility, balance, decreased knowledge of precautions, decreased knowledge of use of DME, vision, and UE functional use, cognitive skills including problem solving, and psychosocial skills including coping strategies, environmental adaptation, and routines and behaviors.   IMPAIRMENTS: are limiting patient from ADLs, IADLs, and leisure.   CO-MORBIDITIES: has co-morbidities such as breast cancer and prior stroke  that affects occupational performance. Patient will benefit from skilled OT to address above impairments and improve overall function.  REHAB POTENTIAL: Good  PLAN:  OT FREQUENCY: 1-2x/week  OT DURATION: 4 weeks  PLANNED INTERVENTIONS: 97535 self care/ADL training, 19147 therapeutic exercise, 97530 therapeutic activity, 97112 neuromuscular re-education, 97140 manual therapy, 97035 ultrasound, passive range of motion, balance training, energy conservation, coping strategies  training, patient/family education, and DME and/or AE instructions  RECOMMENDED OTHER SERVICES: Pt receiving PT treatment also  CONSULTED AND AGREED WITH PLAN OF CARE: Patient  PLAN FOR NEXT SESSION: initiate sensory precautions/assess goals  UE ROM program - table top slides/stretches, manual techniques and pain management for RUE Monitor tremors for modifications with coordination activities HEPs - putty/coordination  Appts: Tuesday AM better than Thursday/late appts - may need some changes to schedule  Altamease Asters, OT 12/16/2023, 1:22 PM

## 2023-12-16 ENCOUNTER — Ambulatory Visit: Admitting: Occupational Therapy

## 2023-12-16 ENCOUNTER — Ambulatory Visit: Admitting: Physical Therapy

## 2023-12-16 VITALS — BP 128/66 | HR 89

## 2023-12-16 DIAGNOSIS — C50411 Malignant neoplasm of upper-outer quadrant of right female breast: Secondary | ICD-10-CM | POA: Diagnosis not present

## 2023-12-16 DIAGNOSIS — M79621 Pain in right upper arm: Secondary | ICD-10-CM | POA: Diagnosis not present

## 2023-12-16 DIAGNOSIS — R2681 Unsteadiness on feet: Secondary | ICD-10-CM | POA: Diagnosis not present

## 2023-12-16 DIAGNOSIS — Z170 Estrogen receptor positive status [ER+]: Secondary | ICD-10-CM | POA: Diagnosis not present

## 2023-12-16 DIAGNOSIS — I69351 Hemiplegia and hemiparesis following cerebral infarction affecting right dominant side: Secondary | ICD-10-CM

## 2023-12-16 DIAGNOSIS — R2689 Other abnormalities of gait and mobility: Secondary | ICD-10-CM | POA: Diagnosis not present

## 2023-12-16 DIAGNOSIS — M6281 Muscle weakness (generalized): Secondary | ICD-10-CM | POA: Diagnosis not present

## 2023-12-16 DIAGNOSIS — R208 Other disturbances of skin sensation: Secondary | ICD-10-CM

## 2023-12-16 DIAGNOSIS — R29818 Other symptoms and signs involving the nervous system: Secondary | ICD-10-CM | POA: Diagnosis not present

## 2023-12-16 DIAGNOSIS — I6350 Cerebral infarction due to unspecified occlusion or stenosis of unspecified cerebral artery: Secondary | ICD-10-CM

## 2023-12-16 DIAGNOSIS — R278 Other lack of coordination: Secondary | ICD-10-CM

## 2023-12-16 DIAGNOSIS — M21371 Foot drop, right foot: Secondary | ICD-10-CM | POA: Diagnosis not present

## 2023-12-16 NOTE — Therapy (Signed)
 OUTPATIENT PHYSICAL THERAPY NEURO TREATMENT   Patient Name: [name] MRN: [id number] DOB:19-Aug-1951, 73 y.o., female Today's Date: 12/16/2023   PCP: Pete Brand, DO REFERRING PROVIDER: Sterling Eisenmenger, PA-C  END OF SESSION:  PT End of Session - 12/16/23 1317     Visit Number 4    Number of Visits 9    Date for PT Re-Evaluation 12/29/23    Authorization Type [employer] Medicare    PT Start Time 1315    PT Stop Time 1357    PT Time Calculation (min) 42 min    Equipment Utilized During Treatment Gait belt    Activity Tolerance Patient tolerated treatment well    Behavior During Therapy WFL for tasks assessed/performed              Past Medical History:  Diagnosis Date   Arthritis    "all over my body" (03/13/2013)   Asthma    Asymptomatic carotid artery stenosis, bilateral 10/08/2018   Breast cancer (HCC)    GERD (gastroesophageal reflux disease)    H/O hiatal hernia    Headache    pt states she has had headaches for about 6 months "off and on"   Heart murmur    pt had echocardiogram on 09/17/21   Hypercholesterolemia 10/08/2018   Hypertension    Hypothyroidism    Myasthenia gravis (HCC)    "in my eyes; diagnsosed > 7 yr ago" (03/13/2013)   Sleep apnea    on CPAP   Stroke (HCC) 2021   Thyroid  carcinoma (HCC)    Type II diabetes mellitus (HCC)    Past Surgical History:  Procedure Laterality Date   ABDOMINAL HYSTERECTOMY     ANTERIOR CERVICAL DECOMP/DISCECTOMY FUSION     "I've had severa ORs; always went in from the front" (03/13/2013)   APPENDECTOMY     BREAST LUMPECTOMY WITH RADIOACTIVE SEED AND SENTINEL LYMPH NODE BIOPSY Left 09/02/2022   Procedure: LEFT BREAST LUMPECTOMY WITH RADIOACTIVE SEED AND SENTINEL LYMPH NODE BIOPSY;  Surgeon: Oza Blumenthal, MD;  Location: [facility] OR;  Service: General;  Laterality: Left;   CARDIAC CATHETERIZATION     "several" (03/13/2013)   CARPAL TUNNEL RELEASE Right    CATARACT EXTRACTION W/ INTRAOCULAR LENS  IMPLANT,  BILATERAL Bilateral    CHOLECYSTECTOMY     KNEE ARTHROSCOPY Left    SHOULDER ARTHROSCOPY W/ ROTATOR CUFF REPAIR Left    TONSILLECTOMY     TOTAL THYROIDECTOMY     TRANSCAROTID ARTERY REVASCULARIZATION  Left 09/24/2021   Procedure: LEFT TRANSCAROTID ARTERY REVASCULARIZATION;  Surgeon: Carlene Che, MD;  Location: [facility] OR;  Service: Vascular;  Laterality: Left;   TRANSCAROTID ARTERY REVASCULARIZATION  Right 10/23/2021   Procedure: Right Transcarotid Artery Revascularization;  Surgeon: Carlene Che, MD;  Location: [facility] OR;  Service: Vascular;  Laterality: Right;   ULTRASOUND GUIDANCE FOR VASCULAR ACCESS Right 09/24/2021   Procedure: ULTRASOUND GUIDANCE FOR VASCULAR ACCESS;  Surgeon: Carlene Che, MD;  Location: [facility] OR;  Service: Vascular;  Laterality: Right;   ULTRASOUND GUIDANCE FOR VASCULAR ACCESS Right 10/23/2021   Procedure: ULTRASOUND GUIDANCE FOR VASCULAR ACCESS, LEFT FEMORAL VEIN;  Surgeon: Carlene Che, MD;  Location: [facility];  Service: Vascular;  Laterality: Right;   Patient Active Problem List   Diagnosis Date Noted   Nausea and vomiting 11/06/2023   Leukocytosis 11/06/2023   SIRS (systemic inflammatory response syndrome) (HCC) 11/06/2023   Right pontine stroke (HCC) 11/02/2023   Stroke (HCC) 10/29/2023   Peripheral neuropathy 11/03/2022  Port-A-Cath in place 10/21/2022   S/P breast lumpectomy 09/02/2022   S/P lumpectomy, left breast 09/02/2022   Malignant neoplasm of upper-outer quadrant of left breast in female, estrogen receptor positive (HCC) 08/10/2022   Asymptomatic carotid artery stenosis without infarction, right 10/23/2021   Carotid stenosis 10/23/2021   Stroke-like symptoms    TIA (transient ischemic attack)    Hypokalemia 09/17/2021   Right facial numbness 09/16/2021   Insulin  dependent type 2 diabetes mellitus (HCC) 09/16/2021   Hypothyroidism 09/16/2021   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HCC) 12/27/2019   Primary hypertension    Uncomplicated asthma     Uncontrolled type 2 diabetes mellitus with hyperglycemia (HCC)    Thalamic stroke (HCC) 12/20/2019   Bilateral carotid artery stenosis 10/08/2018   Hyperlipidemia associated with type 2 diabetes mellitus (HCC) 10/08/2018   Headache around the eyes 11/12/2016   Cervicalgia of occipito-atlanto-axial region 11/12/2016   Moderate persistent asthma 07/29/2015   Current use of beta blocker 07/29/2015   Gastroesophageal reflux disease without esophagitis 07/29/2015   OSA on CPAP 09/19/2014   Diabetic neuropathy with neurologic complication (HCC) 09/19/2014   Myasthenia gravis in remission (HCC) 09/19/2014   Dyspnea 08/13/2014   Erb-Goldflam disease (HCC) 07/27/2014   Cancer of thyroid  (HCC) 07/27/2014   OSA (obstructive sleep apnea) 07/24/2014   DM (diabetes mellitus) type II uncontrolled with eye manifestation (HCC) 06/01/2014   Follicular thyroid  cancer (HCC) 06/01/2014   Nocturia more than twice per night 06/01/2014   Snoring 06/01/2014   Insomnia due to medical condition 06/01/2014   Neuroma 01/16/2014   Pseudoclaudication 11/14/2013   Heart palpitations 06/30/2013   Sinus tachycardia 03/29/2013   Myasthenia gravis (HCC) 03/13/2013   Essential hypertension, benign 03/13/2013   Asthma, chronic 03/13/2013    ONSET DATE: 11/15/2023 (referral)   REFERRING DIAG: I63.50 (ICD-10-CM) - Cerebral infarction due to unspecified occlusion or stenosis of unspecified cerebral artery  THERAPY DIAG:  Muscle weakness (generalized)  Unsteadiness on feet  Other abnormalities of gait and mobility  Rationale for Evaluation and Treatment: Rehabilitation  SUBJECTIVE:                                                                                                                                                                                             SUBJECTIVE STATEMENT: Pt reports that things have been going "so-so" since last PT visit, no acute changes.  Pt reports her BG was 159 this  morning.  Pt accompanied by: self  PERTINENT HISTORY: prior left hemispheric TIA and left thalamic infarct, bilateral ICA stents, chronic headaches, diabetes mellitus, hypothyroidism, hypertension and diagnosis of breast cancer August 2024. Presented to the  ED on 10/29/2023 with right facial, upper and lower extremity numbness. Reported new onset of dizziness for approximately one week prior to presentation at ED  PAIN:  Are you having pain? Yes: NPRS scale: 6/10 Pain location: headache Pain description: Throbbing, achy   PRECAUTIONS: Fall and Other: Port in R chest   RED FLAGS: None   WEIGHT BEARING RESTRICTIONS: No  FALLS: Has patient fallen in last 6 months? No  LIVING ENVIRONMENT: Lives with: lives alone Lives in: House/apartment Stairs: Yes: External: 2 steps; none Has following equipment at home: Environmental consultant - 2 wheeled, Shower bench, Grab bars, and Ramped entry  PLOF: Requires assistive device for independence  PATIENT GOALS: "I wanna get my foot up without using my hand- I don't want to drag it"   OBJECTIVE:  Note: Objective measures were completed at Evaluation unless otherwise noted.  DIAGNOSTIC FINDINGS: MRI of brain from 10/29/2023  IMPRESSION: 1. 5 mm acute infarct within the dorsal pons on the right. 2. Chronic lacunar infarcts and chronic small ischemic changes elsewhere within the pons, new from the prior brain MRI of 09/17/2021. 3. Chronic lacunar infarcts within the bilateral thalami, also new from the prior MRI. 4. Few nonspecific punctate chronic microhemorrhages within the supratentorial brain. 5. Mild paranasal sinus disease as described.  COGNITION: Overall cognitive status: Within functional limits for tasks assessed                                  Vitals:   12/16/23 1322  BP: 128/66  Pulse: 89                                                                                   TREATMENT:    Self-care/home management  Assessed vitals (see  above) in L forearm and vitals WFL.  NMR In // bars to work on R hip flexor and abductor strengthening: Alt L/R 2" forwards step taps x 10 reps B RLE 2" lateral step taps x 10 reps Eccentric 4" step downs with LLE x 10 reps Eccentric 2" step downs with RLE x 10 reps Lateral sidestepping 2 x 10 ft L/R  From EOM to work on RLE strengthening: Staggered sit to stands 2 x 10 reps with 2" block under LLE Sit to stands no UE x 10 reps x 5 reps with 4# weighted ball x 5 reps with unweighted ball  Gait Gait pattern: decreased hip/knee flexion- Right, decreased ankle dorsiflexion- Right, and poor foot clearance- Right Distance walked: 115 ft Assistive device utilized: Walker - 2 wheeled Level of assistance: Modified independence Comments: foot drag that increases with onset of fatigue; pt not interested in pursuing a brace or foot-up brace at this time     PATIENT EDUCATION: Education details: continue HEP, next appt time Person educated: Patient Education method: Medical illustrator Education comprehension: verbalized understanding and returned demonstration  HOME EXERCISE PROGRAM: Access Code: [email or web address] URL: [email or web address] Date: 12/14/2023 Prepared by: Burleigh Carp Plaster  Exercises - Sit to Stand Without Arm Support  - 1 x daily - 7 x weekly - 2 sets - 8-10 reps - Seated march over  -  1 x daily - 7 x weekly - 3 sets - 10 reps  GOALS: Goals reviewed with patient? Yes  STG = LTG DUE TO POC LENGTH   LONG TERM GOALS: Target date: 12/22/2023   Pt will be independent with final HEP for improved strength, balance, transfers and gait.  Baseline:  Goal status: INITIAL  2.  Pt will improve 5 x STS to less than or equal to 18 seconds without UE support to demonstrate improved functional strength and transfer efficiency.   Baseline: 21.84s w/intermittent UE support  Goal status: REVISED  3.  Patient will improve gait speed to 0.5 m/s with LRAD to  indicate improvement to the level of limited community ambulator in order to participate more easily in activities outside of the home.   Baseline: 0.39 m/s with 2WW Goal status: INITIAL  4.  Pt will report ability to bring RLE into/out of bed without use of strap for improved functional RLE strength  Baseline:  Goal status: INITIAL   ASSESSMENT:  CLINICAL IMPRESSION: Emphasis of skilled PT session on monitoring BP and continuing to work on RLE NMR and strengthening. Pt continues to exhibit decreased strength in her RLE and impaired gait mechanics as noted above that exacerbate with increase in fatigue. Pt continues to decline pursuing a foot up brace or an AFO. Pt continues to benefit from skilled PT services to work towards increased safety and independence with functional mobility. Continue POC.   OBJECTIVE IMPAIRMENTS: Abnormal gait, decreased activity tolerance, decreased balance, decreased endurance, decreased mobility, difficulty walking, decreased strength, impaired sensation, impaired UE functional use, impaired vision/preception, and pain  ACTIVITY LIMITATIONS: carrying, lifting, bending, standing, stairs, and locomotion level  PARTICIPATION LIMITATIONS: driving, shopping, community activity, and yard work  PERSONAL FACTORS: Past/current experiences, Transportation, and 1-2 comorbidities: Previous CVA and double vision  are also affecting patient's functional outcome.   REHAB POTENTIAL: Good  CLINICAL DECISION MAKING: Evolving/moderate complexity  EVALUATION COMPLEXITY: Moderate  PLAN:  PT FREQUENCY: 1-2x/week  PT DURATION: 4 weeks  PLANNED INTERVENTIONS: 97164- PT Re-evaluation, 97110-Therapeutic exercises, 97530- Therapeutic activity, 97112- Neuromuscular re-education, 97535- Self Care, 86578- Manual therapy, [phone]- Gait training, [phone]- Orthotic Fit/training, [phone]- Aquatic Therapy, [phone]- Electrical stimulation (manual), Patient/Family education, Balance training,  Stair training, Dry Needling, Joint mobilization, Vestibular training, and DME instructions  PLAN FOR NEXT SESSION: monitor vitals. Work on RLE Automatic Data, transfers - eccentric control and safety with 2WW, R hip flexion, powder board?    AFO: patient with R toe cap, not wanting to pursue at this time due to financial concerns and braces not working with her current shoes when trialed in hospital   Lorita Rosa, PT Lorita Rosa, PT, DPT, CSRS  12/16/2023, 1:59 PM

## 2023-12-16 NOTE — Patient Instructions (Addendum)
 Compensation Strategies for Tremors  When eating, try the following: Eat out of bowls, divided plates, or use a plate guard (available at a medical supply store) and eat with a spoon so that you have an edge to scoop up food. Try raising your plate so that there is less distance between the plate and mouth. Try stabilizing elbows on the table or against your body. Use utensil with built-up/larger grips as they are easier to hold.  When writing, try the following:   Stabilize forearm on the table Take your time as rushing/being stressed can increase tremors. Try a felt-tipped pen, it does not glide as much.  Avoid gel pens (they move too much). Consider using pre-printed labels with your name and address (carry them with you when you go out) or you can get stamps with your address or signature on it. Use a small tape recorder to record messages/reminders for yourself. Use pens with bigger grips.  When brushing your teeth, putting on make-up, or styling hair, try the following: Use an electric toothbrush. Use items with built-up grips. Stabilize your elbows against your body or on the counter. Use long-handled brushes/combs. Use a hair dryer with a stand.  In general: Avoid stress, fatigue, or rushing as this can increase tremors. Sit down for activities that require more control/coordination.    Diplopia HEP:  Perform at least 3 times per day. Stop if your eye becomes fatigued or hurts and try again later.  1. Hold a small object/card in front of you.  Hold it in the middle at arm's length away.    2. Cover your LEFT eye and look at the object with your RIGHT eye.  3. Slowly move the object side to side in front of you while continuing to watch it with your RIGHT eye.  4.  Remember to keep your head still and only move your eye.  5.  Repeat 5-10 times.  6.  Then, move object up and down while watching it 5-10 times.  7. Cover your RIGHT eye and look at the object with your  LEFT eye while you repeat #1-6 above.  8.  Now, uncover both eyes and try to focus on the object while holding it in the middle.  Try to make it 1 image.   9.  If you can, try to hold it for 10-30 sec increasing as able.    10.  Once you can make the image 1 for at least 30 sec in the middle, repeat #1-6 above with both eyes moving slowly and only in the range that you can keep the image 1.   Heat Home Program Heat is used as part of your therapy for several reasons.  Heat increases blood flow, which promotes healing. Heat also relaxes muscles and joints which makes exercising easier.  Complete BEFORE exercise to help manage pain and increase stretch.  It can be applied for __10-15_ minutes,  _up to 3_ sessions per day  Use one of the following methods that is most convenient for you  Heating pad: Follow instructions given by manufacturer.  Use on low-medium setting.    Moist heated towel: Soak towel in hot water or place damp towel in microwave and heat for 30 sec. Check temperature to determine if further time needed.  Wring out towel and wrap around affected area, cover with a plastic bag.  Can also wrap a dry towel around the plastic to help retain the heat.    Microwave gel pack:  Follow instructions given by manufacturer.  Check temperature before application to ensure not hot enough to cause a burn    Rice sock: This can be made using a sock with no synthetic material and 1.5 cups of rice.  Pour the rice into the sock, tie a knot at the top.  Place in microwave for 1 minute, remove to check temperature to determine if further heating time is required prior to application  Be sure to check the skin periodically to ensure no excessive redness or blisters.  Use with caution in areas with decreased sensation

## 2023-12-17 ENCOUNTER — Encounter: Payer: Self-pay | Admitting: Adult Health

## 2023-12-20 NOTE — Telephone Encounter (Signed)
 These refills should be obtained by PCP -would encourage her to reach out to PCP but if any issues with this, let me know. Thank you.

## 2023-12-21 ENCOUNTER — Ambulatory Visit: Admitting: Occupational Therapy

## 2023-12-21 ENCOUNTER — Ambulatory Visit: Admitting: Physical Therapy

## 2023-12-21 ENCOUNTER — Encounter: Admitting: Occupational Therapy

## 2023-12-21 VITALS — BP 144/53 | HR 87

## 2023-12-21 DIAGNOSIS — R2681 Unsteadiness on feet: Secondary | ICD-10-CM

## 2023-12-21 DIAGNOSIS — R2689 Other abnormalities of gait and mobility: Secondary | ICD-10-CM | POA: Diagnosis not present

## 2023-12-21 DIAGNOSIS — R29818 Other symptoms and signs involving the nervous system: Secondary | ICD-10-CM

## 2023-12-21 DIAGNOSIS — M6281 Muscle weakness (generalized): Secondary | ICD-10-CM

## 2023-12-21 DIAGNOSIS — R278 Other lack of coordination: Secondary | ICD-10-CM

## 2023-12-21 DIAGNOSIS — Z170 Estrogen receptor positive status [ER+]: Secondary | ICD-10-CM | POA: Diagnosis not present

## 2023-12-21 DIAGNOSIS — C50411 Malignant neoplasm of upper-outer quadrant of right female breast: Secondary | ICD-10-CM | POA: Diagnosis not present

## 2023-12-21 DIAGNOSIS — I69351 Hemiplegia and hemiparesis following cerebral infarction affecting right dominant side: Secondary | ICD-10-CM | POA: Diagnosis not present

## 2023-12-21 DIAGNOSIS — R208 Other disturbances of skin sensation: Secondary | ICD-10-CM

## 2023-12-21 DIAGNOSIS — M79621 Pain in right upper arm: Secondary | ICD-10-CM | POA: Diagnosis not present

## 2023-12-21 DIAGNOSIS — I6350 Cerebral infarction due to unspecified occlusion or stenosis of unspecified cerebral artery: Secondary | ICD-10-CM | POA: Diagnosis not present

## 2023-12-21 DIAGNOSIS — M21371 Foot drop, right foot: Secondary | ICD-10-CM | POA: Diagnosis not present

## 2023-12-21 NOTE — Therapy (Signed)
 OUTPATIENT PHYSICAL THERAPY NEURO TREATMENT   Patient Name: [name] MRN: [id number] DOB:08/07/1951, 73 y.o., female Today's Date: 12/21/2023   PCP: Pete Brand, DO REFERRING PROVIDER: Sterling Eisenmenger, PA-C  END OF SESSION:  PT End of Session - 12/21/23 0845     Visit Number 5    Number of Visits 9    Date for PT Re-Evaluation 12/29/23    Authorization Type [employer] Medicare    PT Start Time 0845    PT Stop Time 0925    PT Time Calculation (min) 40 min    Equipment Utilized During Treatment Gait belt    Activity Tolerance Patient tolerated treatment well    Behavior During Therapy WFL for tasks assessed/performed               Past Medical History:  Diagnosis Date   Arthritis    "all over my body" (03/13/2013)   Asthma    Asymptomatic carotid artery stenosis, bilateral 10/08/2018   Breast cancer (HCC)    GERD (gastroesophageal reflux disease)    H/O hiatal hernia    Headache    pt states she has had headaches for about 6 months "off and on"   Heart murmur    pt had echocardiogram on 09/17/21   Hypercholesterolemia 10/08/2018   Hypertension    Hypothyroidism    Myasthenia gravis (HCC)    "in my eyes; diagnsosed > 7 yr ago" (03/13/2013)   Sleep apnea    on CPAP   Stroke (HCC) 2021   Thyroid  carcinoma (HCC)    Type II diabetes mellitus (HCC)    Past Surgical History:  Procedure Laterality Date   ABDOMINAL HYSTERECTOMY     ANTERIOR CERVICAL DECOMP/DISCECTOMY FUSION     "I've had severa ORs; always went in from the front" (03/13/2013)   APPENDECTOMY     BREAST LUMPECTOMY WITH RADIOACTIVE SEED AND SENTINEL LYMPH NODE BIOPSY Left 09/02/2022   Procedure: LEFT BREAST LUMPECTOMY WITH RADIOACTIVE SEED AND SENTINEL LYMPH NODE BIOPSY;  Surgeon: Oza Blumenthal, MD;  Location: [facility] OR;  Service: General;  Laterality: Left;   CARDIAC CATHETERIZATION     "several" (03/13/2013)   CARPAL TUNNEL RELEASE Right    CATARACT EXTRACTION W/ INTRAOCULAR LENS  IMPLANT,  BILATERAL Bilateral    CHOLECYSTECTOMY     KNEE ARTHROSCOPY Left    SHOULDER ARTHROSCOPY W/ ROTATOR CUFF REPAIR Left    TONSILLECTOMY     TOTAL THYROIDECTOMY     TRANSCAROTID ARTERY REVASCULARIZATION  Left 09/24/2021   Procedure: LEFT TRANSCAROTID ARTERY REVASCULARIZATION;  Surgeon: Carlene Che, MD;  Location: [facility] OR;  Service: Vascular;  Laterality: Left;   TRANSCAROTID ARTERY REVASCULARIZATION  Right 10/23/2021   Procedure: Right Transcarotid Artery Revascularization;  Surgeon: Carlene Che, MD;  Location: [facility] OR;  Service: Vascular;  Laterality: Right;   ULTRASOUND GUIDANCE FOR VASCULAR ACCESS Right 09/24/2021   Procedure: ULTRASOUND GUIDANCE FOR VASCULAR ACCESS;  Surgeon: Carlene Che, MD;  Location: [facility] OR;  Service: Vascular;  Laterality: Right;   ULTRASOUND GUIDANCE FOR VASCULAR ACCESS Right 10/23/2021   Procedure: ULTRASOUND GUIDANCE FOR VASCULAR ACCESS, LEFT FEMORAL VEIN;  Surgeon: Carlene Che, MD;  Location: [facility] OR;  Service: Vascular;  Laterality: Right;   Patient Active Problem List   Diagnosis Date Noted   Nausea and vomiting 11/06/2023   Leukocytosis 11/06/2023   SIRS (systemic inflammatory response syndrome) (HCC) 11/06/2023   Right pontine stroke (HCC) 11/02/2023   Stroke (HCC) 10/29/2023   Peripheral neuropathy 11/03/2022  Port-A-Cath in place 10/21/2022   S/P breast lumpectomy 09/02/2022   S/P lumpectomy, left breast 09/02/2022   Malignant neoplasm of upper-outer quadrant of left breast in female, estrogen receptor positive (HCC) 08/10/2022   Asymptomatic carotid artery stenosis without infarction, right 10/23/2021   Carotid stenosis 10/23/2021   Stroke-like symptoms    TIA (transient ischemic attack)    Hypokalemia 09/17/2021   Right facial numbness 09/16/2021   Insulin  dependent type 2 diabetes mellitus (HCC) 09/16/2021   Hypothyroidism 09/16/2021   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HCC) 12/27/2019   Primary hypertension    Uncomplicated asthma     Uncontrolled type 2 diabetes mellitus with hyperglycemia (HCC)    Thalamic stroke (HCC) 12/20/2019   Bilateral carotid artery stenosis 10/08/2018   Hyperlipidemia associated with type 2 diabetes mellitus (HCC) 10/08/2018   Headache around the eyes 11/12/2016   Cervicalgia of occipito-atlanto-axial region 11/12/2016   Moderate persistent asthma 07/29/2015   Current use of beta blocker 07/29/2015   Gastroesophageal reflux disease without esophagitis 07/29/2015   OSA on CPAP 09/19/2014   Diabetic neuropathy with neurologic complication (HCC) 09/19/2014   Myasthenia gravis in remission (HCC) 09/19/2014   Dyspnea 08/13/2014   Erb-Goldflam disease (HCC) 07/27/2014   Cancer of thyroid  (HCC) 07/27/2014   OSA (obstructive sleep apnea) 07/24/2014   DM (diabetes mellitus) type II uncontrolled with eye manifestation (HCC) 06/01/2014   Follicular thyroid  cancer (HCC) 06/01/2014   Nocturia more than twice per night 06/01/2014   Snoring 06/01/2014   Insomnia due to medical condition 06/01/2014   Neuroma 01/16/2014   Pseudoclaudication 11/14/2013   Heart palpitations 06/30/2013   Sinus tachycardia 03/29/2013   Myasthenia gravis (HCC) 03/13/2013   Essential hypertension, benign 03/13/2013   Asthma, chronic 03/13/2013    ONSET DATE: 11/15/2023 (referral)   REFERRING DIAG: I63.50 (ICD-10-CM) - Cerebral infarction due to unspecified occlusion or stenosis of unspecified cerebral artery  THERAPY DIAG:  Muscle weakness (generalized)  Unsteadiness on feet  Hemiplegia and hemiparesis following cerebral infarction affecting right dominant side (HCC)  Rationale for Evaluation and Treatment: Rehabilitation  SUBJECTIVE:                                                                                                                                                                                             SUBJECTIVE STATEMENT: Pt reports that things have been going alright. Feels like she can move  her RLE a bit more. Has a slight headache this morning, BP was "139 over something" this morning.   Pt reports her BG is 123 mg/dL currently.   Pt accompanied by: self  PERTINENT HISTORY: prior left hemispheric TIA  and left thalamic infarct, bilateral ICA stents, chronic headaches, diabetes mellitus, hypothyroidism, hypertension and diagnosis of breast cancer August 2024. Presented to the ED on 10/29/2023 with right facial, upper and lower extremity numbness. Reported new onset of dizziness for approximately one week prior to presentation at ED  PAIN:  Are you having pain? Yes: NPRS scale: 6/10 Pain location: headache Pain description: Throbbing, achy   PRECAUTIONS: Fall and Other: Port in R chest   RED FLAGS: None   WEIGHT BEARING RESTRICTIONS: No  FALLS: Has patient fallen in last 6 months? No  LIVING ENVIRONMENT: Lives with: lives alone Lives in: House/apartment Stairs: Yes: External: 2 steps; none Has following equipment at home: Environmental consultant - 2 wheeled, Shower bench, Grab bars, and Ramped entry  PLOF: Requires assistive device for independence  PATIENT GOALS: "I wanna get my foot up without using my hand- I don't want to drag it"   OBJECTIVE:  Note: Objective measures were completed at Evaluation unless otherwise noted.  DIAGNOSTIC FINDINGS: MRI of brain from 10/29/2023  IMPRESSION: 1. 5 mm acute infarct within the dorsal pons on the right. 2. Chronic lacunar infarcts and chronic small ischemic changes elsewhere within the pons, new from the prior brain MRI of 09/17/2021. 3. Chronic lacunar infarcts within the bilateral thalami, also new from the prior MRI. 4. Few nonspecific punctate chronic microhemorrhages within the supratentorial brain. 5. Mild paranasal sinus disease as described.  COGNITION: Overall cognitive status: Within functional limits for tasks assessed                                  Vitals:   12/21/23 0848  BP: (!) 144/53  Pulse: 87                                                                                     TREATMENT:    Self-care/home management  Assessed vitals (see above) in L forearm and vitals WFL.  Ther Act  NuStep level 5 for 8 minutes using BUE/BLEs for neural priming for reciprocal movement, dynamic cardiovascular warmup and increased amplitude of stepping. Pt using hands to place RLE into foot pedal. Pt required short seated rest breaks intermittently due to fatigue, reported increased tone in RUE as activity progressed. RPE of 7/10 following activity.   NMR  In // bars for improved step clearance, single leg stability and functional hip strength:  Alt fwd adv/retreat over 4" hurdle w/BUE support, x8 reps per side. Min cues to reduce circumduction compensation. Pt reported increased pain in R quad w/activity, very TTP. Discussed asking MD about tone management at next appointment (5/15 w/Dr. Alessandra Ancona)  Lateral adv/retreat over 4" hurdle, 2x5 on RLE and 1x10 on LLE. Less difficult than forward direction.  Reviewed bed mobility as pt still using leg strap to get RLE into bed. Educated pt on sidelying technique (lean onto L elbow and attempt to bring RLE up) as pt performing long-sit technique. Pt reports it is easier to do sidelying technique, so will work on this at home. The following were added to HEP to work on functional hip flexor strength and  spasticity management in R quad:  Supine heel slides on RLE, x5 reps. Placed washcloth under heel to assist w/movement. Pt very challenged by this  Modified Thomas stretch on RLE, x2 minutes.     Gait Gait pattern: decreased hip/knee flexion- Right, decreased ankle dorsiflexion- Right, and poor foot clearance- Right Distance walked: Various clinic distances  Assistive device utilized: Walker - 2 wheeled Level of assistance: Modified independence Comments: foot drag that increases with onset of fatigue   PATIENT EDUCATION: Education details: Updates to HEP, plan to DC  next session (pt request), info to inquire about Baclofen if tone continues to interfere w/ADLs Person educated: Patient Education method: Explanation, Demonstration, and Handouts Education comprehension: verbalized understanding and returned demonstration  HOME EXERCISE PROGRAM: Access Code: [email or web address] URL: [email or web address] Date: 12/14/2023 Prepared by: Burleigh Carp Madia Carvell  Exercises - Sit to Stand Without Arm Support  - 1 x daily - 7 x weekly - 2 sets - 8-10 reps - Seated march over  - 1 x daily - 7 x weekly - 3 sets - 10 reps - Supine Heel Slides  - 1 x daily - 7 x weekly - 2 sets - 6-8 reps - Modified Thomas Stretch  - 1 x daily - 7 x weekly - 2-5 minutes hold  GOALS: Goals reviewed with patient? Yes  STG = LTG DUE TO POC LENGTH   LONG TERM GOALS: Target date: 12/22/2023   Pt will be independent with final HEP for improved strength, balance, transfers and gait.  Baseline:  Goal status: INITIAL  2.  Pt will improve 5 x STS to less than or equal to 18 seconds without UE support to demonstrate improved functional strength and transfer efficiency.   Baseline: 21.84s w/intermittent UE support  Goal status: REVISED  3.  Patient will improve gait speed to 0.5 m/s with LRAD to indicate improvement to the level of limited community ambulator in order to participate more easily in activities outside of the home.   Baseline: 0.39 m/s with 2WW Goal status: INITIAL  4.  Pt will report ability to bring RLE into/out of bed without use of strap for improved functional RLE strength  Baseline:  Goal status: INITIAL   ASSESSMENT:  CLINICAL IMPRESSION: Emphasis of skilled PT session on functional hip flexor strength, increased step clearance and tone management. Pt reports increase in R quad pain w/fatigue and is very TTP w/identifiable trigger points. Encouraged pt to ask Dr. Alessandra Ancona about this at upcomming appointment. Pt continues to be limited by R hip flexor weakness  but has improved step clearance and endurance since starting PT. Continue POC.   OBJECTIVE IMPAIRMENTS: Abnormal gait, decreased activity tolerance, decreased balance, decreased endurance, decreased mobility, difficulty walking, decreased strength, impaired sensation, impaired UE functional use, impaired vision/preception, and pain  ACTIVITY LIMITATIONS: carrying, lifting, bending, standing, stairs, and locomotion level  PARTICIPATION LIMITATIONS: driving, shopping, community activity, and yard work  PERSONAL FACTORS: Past/current experiences, Transportation, and 1-2 comorbidities: Previous CVA and double vision  are also affecting patient's functional outcome.   REHAB POTENTIAL: Good  CLINICAL DECISION MAKING: Evolving/moderate complexity  EVALUATION COMPLEXITY: Moderate  PLAN:  PT FREQUENCY: 1-2x/week  PT DURATION: 4 weeks  PLANNED INTERVENTIONS: [id number]- PT Re-evaluation, 97110-Therapeutic exercises, 97530- Therapeutic activity, 97112- Neuromuscular re-education, 97535- Self Care, 54098- Manual therapy, [phone]- Gait training, [phone]- Orthotic Fit/training, [phone]- Aquatic Therapy, [phone]- Electrical stimulation (manual), Patient/Family education, Balance training, Stair training, Dry Needling, Joint mobilization, Vestibular training, and DME instructions  PLAN FOR NEXT SESSION: monitor vitals. Goals and  DC. Work on RLE NMR, transfers - eccentric control and safety with 2WW, R hip flexion, powder board?     Jayvan Mcshan E Ashtan Laton, PT, DPT  12/21/2023, 9:26 AM

## 2023-12-21 NOTE — Therapy (Unsigned)
 OUTPATIENT OCCUPATIONAL THERAPY NEURO TREATMENT  Patient Name: [name] MRN: [id number] DOB:[birth year], 73 y.o., female Today's Date: 12/21/2023  PCP: Pete Brand, DO REFERRING PROVIDER: Sterling Eisenmenger, PA-C  END OF SESSION:  OT End of Session - 12/21/23 0805     Visit Number 5    Number of Visits 12    Authorization Type [employer] 2025    Authorization Time Period VL: [place]    OT Start Time 0803    OT Stop Time 0845    OT Time Calculation (min) 42 min    Activity Tolerance Patient tolerated treatment well    Behavior During Therapy WFL for tasks assessed/performed            Past Medical History:  Diagnosis Date   Arthritis    "all over my body" (03/13/2013)   Asthma    Asymptomatic carotid artery stenosis, bilateral 10/08/2018   Breast cancer (HCC)    GERD (gastroesophageal reflux disease)    H/O hiatal hernia    Headache    pt states she has had headaches for about 6 months "off and on"   Heart murmur    pt had echocardiogram on 09/17/21   Hypercholesterolemia 10/08/2018   Hypertension    Hypothyroidism    Myasthenia gravis (HCC)    "in my eyes; diagnsosed > 7 yr ago" (03/13/2013)   Sleep apnea    on CPAP   Stroke (HCC) 2021   Thyroid  carcinoma (HCC)    Type II diabetes mellitus (HCC)    Past Surgical History:  Procedure Laterality Date   ABDOMINAL HYSTERECTOMY     ANTERIOR CERVICAL DECOMP/DISCECTOMY FUSION     "I've had severa ORs; always went in from the front" (03/13/2013)   APPENDECTOMY     BREAST LUMPECTOMY WITH RADIOACTIVE SEED AND SENTINEL LYMPH NODE BIOPSY Left 09/02/2022   Procedure: LEFT BREAST LUMPECTOMY WITH RADIOACTIVE SEED AND SENTINEL LYMPH NODE BIOPSY;  Surgeon: Oza Blumenthal, MD;  Location: [facility] OR;  Service: General;  Laterality: Left;   CARDIAC CATHETERIZATION     "several" (03/13/2013)   CARPAL TUNNEL RELEASE Right    CATARACT EXTRACTION W/ INTRAOCULAR LENS  IMPLANT, BILATERAL Bilateral    CHOLECYSTECTOMY     KNEE  ARTHROSCOPY Left    SHOULDER ARTHROSCOPY W/ ROTATOR CUFF REPAIR Left    TONSILLECTOMY     TOTAL THYROIDECTOMY     TRANSCAROTID ARTERY REVASCULARIZATION  Left 09/24/2021   Procedure: LEFT TRANSCAROTID ARTERY REVASCULARIZATION;  Surgeon: Carlene Che, MD;  Location: [facility] OR;  Service: Vascular;  Laterality: Left;   TRANSCAROTID ARTERY REVASCULARIZATION  Right 10/23/2021   Procedure: Right Transcarotid Artery Revascularization;  Surgeon: Carlene Che, MD;  Location: [facility] OR;  Service: Vascular;  Laterality: Right;   ULTRASOUND GUIDANCE FOR VASCULAR ACCESS Right 09/24/2021   Procedure: ULTRASOUND GUIDANCE FOR VASCULAR ACCESS;  Surgeon: Carlene Che, MD;  Location: [facility] OR;  Service: Vascular;  Laterality: Right;   ULTRASOUND GUIDANCE FOR VASCULAR ACCESS Right 10/23/2021   Procedure: ULTRASOUND GUIDANCE FOR VASCULAR ACCESS, LEFT FEMORAL VEIN;  Surgeon: Carlene Che, MD;  Location: [facility] OR;  Service: Vascular;  Laterality: Right;   Patient Active Problem List   Diagnosis Date Noted   Nausea and vomiting 11/06/2023   Leukocytosis 11/06/2023   SIRS (systemic inflammatory response syndrome) (HCC) 11/06/2023   Right pontine stroke (HCC) 11/02/2023   Stroke (HCC) 10/29/2023   Peripheral neuropathy 11/03/2022   Port-A-Cath in place 10/21/2022   S/P breast lumpectomy 09/02/2022  S/P lumpectomy, left breast 09/02/2022   Malignant neoplasm of upper-outer quadrant of left breast in female, estrogen receptor positive (HCC) 08/10/2022   Asymptomatic carotid artery stenosis without infarction, right 10/23/2021   Carotid stenosis 10/23/2021   Stroke-like symptoms    TIA (transient ischemic attack)    Hypokalemia 09/17/2021   Right facial numbness 09/16/2021   Insulin  dependent type 2 diabetes mellitus (HCC) 09/16/2021   Hypothyroidism 09/16/2021   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HCC) 12/27/2019   Primary hypertension    Uncomplicated asthma    Uncontrolled type 2 diabetes mellitus with  hyperglycemia (HCC)    Thalamic stroke (HCC) 12/20/2019   Bilateral carotid artery stenosis 10/08/2018   Hyperlipidemia associated with type 2 diabetes mellitus (HCC) 10/08/2018   Headache around the eyes 11/12/2016   Cervicalgia of occipito-atlanto-axial region 11/12/2016   Moderate persistent asthma 07/29/2015   Current use of beta blocker 07/29/2015   Gastroesophageal reflux disease without esophagitis 07/29/2015   OSA on CPAP 09/19/2014   Diabetic neuropathy with neurologic complication (HCC) 09/19/2014   Myasthenia gravis in remission (HCC) 09/19/2014   Dyspnea 08/13/2014   Erb-Goldflam disease (HCC) 07/27/2014   Cancer of thyroid  (HCC) 07/27/2014   OSA (obstructive sleep apnea) 07/24/2014   DM (diabetes mellitus) type II uncontrolled with eye manifestation (HCC) 06/01/2014   Follicular thyroid  cancer (HCC) 06/01/2014   Nocturia more than twice per night 06/01/2014   Snoring 06/01/2014   Insomnia due to medical condition 06/01/2014   Neuroma 01/16/2014   Pseudoclaudication 11/14/2013   Heart palpitations 06/30/2013   Sinus tachycardia 03/29/2013   Myasthenia gravis (HCC) 03/13/2013   Essential hypertension, benign 03/13/2013   Asthma, chronic 03/13/2013    ONSET DATE: Referral: 11/16/2023 Hospitalized beginning 10/29/23  REFERRING DIAG: I63.50 (ICD-10-CM) - Cerebral infarction due to unspecified occlusion or stenosis of unspecified cerebral artery  THERAPY DIAG:  Muscle weakness (generalized)  Other lack of coordination  Other disturbances of skin sensation  Other symptoms and signs involving the nervous system  Rationale for Evaluation and Treatment: Rehabilitation  SUBJECTIVE:   SUBJECTIVE STATEMENT:  Pt accompanied by: self  Pt reports using heat sometimes 2x/day ie) mid day and end of day for her shoulder and hand.  Pt reports no real changes from diplopia HEP as previously provided and in pt instructions.  PERTINENT HISTORY:   Presented to the ED  [facility] on 10/29/2023 with right facial, upper and lower extremity numbness. Reported new onset of dizziness for approximately one week prior to presentation at ED. MRI was positive for 5 mm acute infarct within the dorsal pons on the right.   PMHx: prior left hemispheric TIA and left thalamic infarct, bilateral ICA stents, chronic headaches, diabetes mellitus, hypothyroidism, hypertension and diagnosis of breast cancer August, 2024. H/O ocular myasthenia gravis and a history of thyroid  cancer status post radiation and total thyroidectomy. She has an insulin  pump which will be reinitiated on admission to CIR.  Inpatient rehab OT note:  Patient has met 9 of 9 long term goals due to improved activity tolerance, improved balance, ability to compensate for deficits, functional use of  RIGHT upper and RIGHT lower extremity, and improved coordination.  Patient to discharge at overall Modified Independent level.  Patient's care partner is independent to provide the necessary physical assistance at discharge. Pt's sisters plan to accompany pt at DC for first several days/weeks and attended education regarding functional transfer and ADL recommendations. Recommendation:  Patient will benefit from ongoing skilled OT services in outpatient setting to continue to  advance functional skills in the area of BADL, iADL, and restore RUE functional use .  PRECAUTIONS: Fall  WEIGHT BEARING RESTRICTIONS: No  PAIN:  Are you having pain? Yes: NPRS scale: 6/10  - headache again today - took tylenol  already Pt reports her RUE pain 3/10 - it has ease off a whole lot.   FALLS: Has patient fallen in last 6 months? No  LIVING ENVIRONMENT: Lives with: lives alone Lives in: House/apartment Stairs: Yes: External: 2 steps; has ramp to cover steps, home is 1 level Has following equipment at home: Otho Blitz - 2 wheeled, Shower bench, and Grab bars  PLOF: Independent with basic ADLs, Independent with household mobility with  device, Independent with community mobility with device, and Needs assistance with homemaking (nephew came ~ every 2 weeks)  PATIENT GOALS: Use right arm better and decreased pain in arm  OBJECTIVE:  Note: Objective measures were completed at Evaluation unless otherwise noted.  HAND DOMINANCE: Right  ADLs:  Overall ADLs: Mod Ind Transfers/ambulation related to ADLs: AE - Pt is using a leg lifter to get her R leg in/out of bed and RW for trnasfers Eating: Ind Grooming: Ind UB Dressing: Ind LB Dressing: Uses grabber to help Toileting: Mod Ind Bathing: Mod Ind Tub Shower transfers: Mod Ind Equipment: Emergency planning/management officer, Reacher, and Long handled sponge  IADLs: Shopping: Gives list to her sister Light housekeeping: Nephew was coming every 2 weeks Meal Prep: Uses stove to cook - not microwave Community mobility: RW Medication management: Uses pillbox and sorts it herself Landscape architect: Ind Handwriting: 90% legible  MOBILITY STATUS: Independent  POSTURE COMMENTS:  rounded shoulders and forward head Sitting balance: WFL  ACTIVITY TOLERANCE: Activity tolerance: Good  FUNCTIONAL OUTCOME MEASURES: Quick Dash: 29.5  UPPER EXTREMITY ROM:    Active ROM Right eval Left eval  Shoulder flexion <90 WFL  Shoulder abduction    Shoulder adduction    Shoulder extension    Shoulder internal rotation Limited   Shoulder external rotation    Elbow flexion Gets it there but slow   Elbow extension    Wrist flexion    Wrist extension    Wrist ulnar deviation    Wrist radial deviation    Wrist pronation    Wrist supination    (Blank rows = not tested)  HAND FUNCTION: Grip strength: Right: 16.5, 17.6, 18.5 lbs; Left: 59.3, 61.9, 66.1 lbs Average: Right: 17.5 lbs Left: 62.4 lbs  12/21/23: Right: 54.4, 59.9, 57.0 = Average 57.1 lbs  COORDINATION: 9 Hole Peg test: Right: 23.89 sec; Left: 27.68 sec Box and Blocks:  Right 48 blocks, Left 51 blocks - hurts a little/tired R  hand shakes/tremulous  SENSATION: Light touch: Impaired Pt reported to P.T. that  she had a lot of burning tingling in her legs at the hospital, thinks it was a side effect of a medication because it has gotten better since being home. Still has numbness of her hands   12/21/23 - L side feels normal, R side is dull and the R pinkie is completely numb up into the hand sometimes  Sensation per recent hospitalization:  Light Touch Impaired Details: Impaired RLE Hot/Cold: Not tested Proprioception: Appears Intact Stereognosis: Not tested Additional Comments: decreased sensation along RLE with tingling in R lower leg and foot. Coordination Gross Motor Movements are Fluid and Coordinated: No Fine Motor Movements are Fluid and Coordinated: Yes Coordination and Movement Description: grossly uncoordinated due to R hemiparesis with visual impairments Finger Nose Finger Test: slow  Heel Shin Test: unable to perform on RLE, slow and decresaed ROM on LLE  EDEMA: NA  MUSCLE TONE: WFL  COGNITION: Overall cognitive status:  No changes by pt report BIMS Summary Score: 15  VISION: Subjective report: Pt reports double vision and floaters for years d/t Mysathenia gravis in eyes, hasn't driven since first stroke in 2020 Baseline vision: Bifocals Visual history: cataracts and myasthenia gravis and caratact sx both  VISION ASSESSMENT: Not tested  Patient has difficulty with following activities due to following visual impairments: has double vision  PERCEPTION: Not tested  PRAXIS: Not tested  OBSERVATIONS: Pt ambulates with RW with no loss of balance. The pt is well kept.  OTR notes R hand shaking and tremulous with UE coordination testing.                                                                                                                          TREATMENT :    Therapeutic Exercises  Pt engaged in resistance bar exercises with yellow flex bar x 10 reps each for strength and  endurance of affected extremity for the following: -wrist flex and extension - twisting bar forward and backwards to flex and extend wrist/s -supination/pronation - bending the bar on table top side to side to rotate forearm - palm up and down -radial/ulnar deviation - pushing the bar back on forth on table top to tip wrist - from pinkie back to thumb  Pt able to perform all motions without difficulty or increased discomfort.     Retested RUE Grip strength to assess progress for probable DC: at eval RUE 17.5 lbs and today x 3 trials -  Right: 54.4, 59.9, 57.0 = Average 57.1 lbs  Therapeutic Activities   Reviewed 5 main sensory precautions - hot/cold, sharp/breakable, heavy and chemical for safety with dull RUE sensation and numbness of 5th digit.  Pt completed stereognosis challenge to improve sensory perception, item discrimination, and in hand manipulation. Utilized various objects for stereognosis challenge with pt able to match objects with affected hand  with no prior awareness of objects. Pt completed challenge with mild errors. Pt completed same challenges with unaffected extremity for additional feedback.    Demonstrated and engaged in Rice bin activity to help with sensory stimulation of right hand/s.  De/resensitization demonstrated with suggestions of alternatives at home ie) dry beans, macaroni.  Pt encouraged to use left hand first to find objects hidden in the rice and encouraged to try and identify objects or describe them with vision occluded with Good succes at identifying key features of items such as block, peg, checker, peg, etc. Pt was then able to identify 10 + objects with RUE.  Pt could match them appropriately to those previously found.  PT continues to be encouraged to use visualization, verbal feedback/positive talk to help with awareness of different features of objects ie) round, square, spiky, rubbery, cool etc.  PATIENT EDUCATION: Education details: DC  planning Person educated: Patient Education  method: Explanation, Demonstration, Tactile cues, and Verbal cues Education comprehension: verbalized understanding, returned demonstration, verbal cues required, tactile cues required, and needs further education  HOME EXERCISE PROGRAM: 11/24/23: Tendon Gliding Exercises 12/07/23: Putty Activities Access Code: [email or web address] 12/14/23: Coordination Activities 12/16/2023: Heat; Diplopia; Tremor reduction strategies  GOALS: Goals reviewed with patient? Yes   LONG TERM GOALS: Target date: 12/31/23  Patient will demonstrate updated R UE HEP with visual handouts only for proper execution. Baseline: New to outpt OT  Goal status: MET  2.  Patient will demonstrate at least 20+ lbs RUE grip strength as needed to open jars and other containers. Baseline: Right: 17.5 lbs Left: 62.4 lbs Goal status: MET 12/21/23 Right: 54.4, 59.9, 57.0 = Average 57.1 lbs  3.  Pt will verbalize understanding of adapted strategies and/or equipment to address tremors of RUE with ADLs and IADLs. Baseline: New to outpt OT  Goal status: IN Progress  4.  Pt will be able to place at least 50+ blocks using right hand with completion of Box and Blocks test without discomfort. Baseline:  Right 48 blocks - hurts/tired, Left 51 blocks  Goal status: IN Progress  5. Pt will independently recall the 5 main sensory precautions (cold, heat, sharp, chemical, and heavy) as needed to prevent injury/harm secondary to impairments.    Baseline: R UE dull and numb  Goal Status: IN Progress  6.  Patient will demonstrate any improvement with quick Dash score (reporting 25% disability or less) indicating improved functional use of right arm better and decreased pain in RUE. Baseline: 29.5 Goal status: IN Progress  ASSESSMENT:  CLINICAL IMPRESSION: Pt verbalizes good understanding of HEP and general recommendations to maximize safety with sensory changes as well as independence and safety with ADL  and IADL completion. Pt will benefit from continued skilled OT services in the outpatient setting to work on impairments as noted at evaluation to help pt return to [facility] as able.    PERFORMANCE DEFICITS: in functional skills including ADLs, IADLs, coordination, dexterity, proprioception, sensation, edema, tone, ROM, strength, pain, fascial restrictions, muscle spasms, flexibility, Fine motor control, Gross motor control, mobility, balance, decreased knowledge of precautions, decreased knowledge of use of DME, vision, and UE functional use, cognitive skills including problem solving, and psychosocial skills including coping strategies, environmental adaptation, and routines and behaviors.   IMPAIRMENTS: are limiting patient from ADLs, IADLs, and leisure.   CO-MORBIDITIES: has co-morbidities such as breast cancer and prior stroke  that affects occupational performance. Patient will benefit from skilled OT to address above impairments and improve overall function.  REHAB POTENTIAL: Good  PLAN:  OT FREQUENCY: 1-2x/week  OT DURATION: 4 weeks  PLANNED INTERVENTIONS: 97535 self care/ADL training, 16109 therapeutic exercise, 97530 therapeutic activity, 97112 neuromuscular re-education, 97140 manual therapy, 97035 ultrasound, passive range of motion, balance training, energy conservation, coping strategies training, patient/family education, and DME and/or AE instructions  RECOMMENDED OTHER SERVICES: Pt receiving PT treatment also  CONSULTED AND AGREED WITH PLAN OF CARE: Patient  PLAN FOR NEXT SESSION: assess goals for DC  UE ROM program - table top slides/stretches, manual techniques and pain management for RUE Monitor tremors for modifications with coordination activities Review HEPs - putty/coordination  Reiterate: diplopia HEP may still be beneficial in strengthening ocular muscles even if not correcting double vision  Zora Hires, OT 12/21/2023, 3:16 PM

## 2023-12-23 ENCOUNTER — Ambulatory Visit: Admitting: Physical Therapy

## 2023-12-23 ENCOUNTER — Ambulatory Visit: Attending: Physician Assistant | Admitting: Occupational Therapy

## 2023-12-23 VITALS — BP 140/67 | HR 95

## 2023-12-23 DIAGNOSIS — M6281 Muscle weakness (generalized): Secondary | ICD-10-CM | POA: Insufficient documentation

## 2023-12-23 DIAGNOSIS — R2681 Unsteadiness on feet: Secondary | ICD-10-CM | POA: Diagnosis not present

## 2023-12-23 DIAGNOSIS — M79621 Pain in right upper arm: Secondary | ICD-10-CM | POA: Insufficient documentation

## 2023-12-23 DIAGNOSIS — R208 Other disturbances of skin sensation: Secondary | ICD-10-CM | POA: Insufficient documentation

## 2023-12-23 DIAGNOSIS — R278 Other lack of coordination: Secondary | ICD-10-CM | POA: Diagnosis not present

## 2023-12-23 DIAGNOSIS — R29818 Other symptoms and signs involving the nervous system: Secondary | ICD-10-CM | POA: Diagnosis not present

## 2023-12-23 NOTE — Patient Instructions (Signed)
 Access Code: [email or web address] URL: [email or web address] Date: 12/23/2023 Prepared by: Sudie Ely  NEW Exercises - Seated Single Arm Bicep Curls with Rotation and Dumbbell  - 1 x daily - 10 reps - Seated Shoulder Flexion Towel Slide at Table Top  - 1 x daily - 10 reps - Shoulder Flexion Wall Slide with Towel  - 1 x daily - 10 reps - Seated Single Arm Shoulder Press with Dumbbell  - 1 x daily - 10 reps - Seated Wrist Supination Pronation with Can  - 1 x daily - 10 reps

## 2023-12-23 NOTE — Therapy (Signed)
 OUTPATIENT PHYSICAL THERAPY NEURO TREATMENT - DISCHARGE SUMMARY   Patient Name: [name] MRN: [id number] DOB:10-03-1950, 73 y.o., female Today's Date: 12/23/2023   PCP: Pete Brand, DO REFERRING PROVIDER: Sterling Eisenmenger, PA-C  PHYSICAL THERAPY DISCHARGE SUMMARY  Visits from Start of Care: 6  Current functional level related to goals / functional outcomes: Pt is mod I w/RW    Remaining deficits: R hemiparesis, decreased activity tolerance, limited community ambulator     Education / Equipment: HEP, pt declining AFO at this time   Patient agrees to discharge. Patient goals were partially met. Patient is being discharged due to being pleased with the current functional level.   END OF SESSION:  PT End of Session - 12/23/23 1242     Visit Number 6    Number of Visits 9    Date for PT Re-Evaluation 12/29/23    Authorization Type [employer]    PT Start Time 1240   Pt not checked in   PT Stop Time 1310    PT Time Calculation (min) 30 min    Equipment Utilized During Treatment --    Activity Tolerance Patient tolerated treatment well    Behavior During Therapy WFL for tasks assessed/performed                Past Medical History:  Diagnosis Date   Arthritis    "all over my body" (03/13/2013)   Asthma    Asymptomatic carotid artery stenosis, bilateral 10/08/2018   Breast cancer (HCC)    GERD (gastroesophageal reflux disease)    H/O hiatal hernia    Headache    pt states she has had headaches for about 6 months "off and on"   Heart murmur    pt had echocardiogram on 09/17/21   Hypercholesterolemia 10/08/2018   Hypertension    Hypothyroidism    Myasthenia gravis (HCC)    "in my eyes; diagnsosed > 7 yr ago" (03/13/2013)   Sleep apnea    on CPAP   Stroke (HCC) 2021   Thyroid  carcinoma (HCC)    Type II diabetes mellitus (HCC)    Past Surgical History:  Procedure Laterality Date   ABDOMINAL HYSTERECTOMY     ANTERIOR CERVICAL DECOMP/DISCECTOMY  FUSION     "I've had severa ORs; always went in from the front" (03/13/2013)   APPENDECTOMY     BREAST LUMPECTOMY WITH RADIOACTIVE SEED AND SENTINEL LYMPH NODE BIOPSY Left 09/02/2022   Procedure: LEFT BREAST LUMPECTOMY WITH RADIOACTIVE SEED AND SENTINEL LYMPH NODE BIOPSY;  Surgeon: Oza Blumenthal, MD;  Location: [facility] OR;  Service: General;  Laterality: Left;   CARDIAC CATHETERIZATION     "several" (03/13/2013)   CARPAL TUNNEL RELEASE Right    CATARACT EXTRACTION W/ INTRAOCULAR LENS  IMPLANT, BILATERAL Bilateral    CHOLECYSTECTOMY     KNEE ARTHROSCOPY Left    SHOULDER ARTHROSCOPY W/ ROTATOR CUFF REPAIR Left    TONSILLECTOMY     TOTAL THYROIDECTOMY     TRANSCAROTID ARTERY REVASCULARIZATION  Left 09/24/2021   Procedure: LEFT TRANSCAROTID ARTERY REVASCULARIZATION;  Surgeon: Carlene Che, MD;  Location: [facility] OR;  Service: Vascular;  Laterality: Left;   TRANSCAROTID ARTERY REVASCULARIZATION  Right 10/23/2021   Procedure: Right Transcarotid Artery Revascularization;  Surgeon: Carlene Che, MD;  Location: [facility] OR;  Service: Vascular;  Laterality: Right;   ULTRASOUND GUIDANCE FOR VASCULAR ACCESS Right 09/24/2021   Procedure: ULTRASOUND GUIDANCE FOR VASCULAR ACCESS;  Surgeon: Carlene Che, MD;  Location: [facility] OR;  Service: Vascular;  Laterality: Right;   ULTRASOUND GUIDANCE FOR VASCULAR ACCESS Right 10/23/2021   Procedure: ULTRASOUND GUIDANCE FOR VASCULAR ACCESS, LEFT FEMORAL VEIN;  Surgeon: Carlene Che, MD;  Location: [facility] OR;  Service: Vascular;  Laterality: Right;   Patient Active Problem List   Diagnosis Date Noted   Nausea and vomiting 11/06/2023   Leukocytosis 11/06/2023   SIRS (systemic inflammatory response syndrome) (HCC) 11/06/2023   Right pontine stroke (HCC) 11/02/2023   Stroke (HCC) 10/29/2023   Peripheral neuropathy 11/03/2022   Port-A-Cath in place 10/21/2022   S/P breast lumpectomy 09/02/2022   S/P lumpectomy, left breast 09/02/2022   Malignant neoplasm of  upper-outer quadrant of left breast in female, estrogen receptor positive (HCC) 08/10/2022   Asymptomatic carotid artery stenosis without infarction, right 10/23/2021   Carotid stenosis 10/23/2021   Stroke-like symptoms    TIA (transient ischemic attack)    Hypokalemia 09/17/2021   Right facial numbness 09/16/2021   Insulin  dependent type 2 diabetes mellitus (HCC) 09/16/2021   Hypothyroidism 09/16/2021   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HCC) 12/27/2019   Primary hypertension    Uncomplicated asthma    Uncontrolled type 2 diabetes mellitus with hyperglycemia (HCC)    Thalamic stroke (HCC) 12/20/2019   Bilateral carotid artery stenosis 10/08/2018   Hyperlipidemia associated with type 2 diabetes mellitus (HCC) 10/08/2018   Headache around the eyes 11/12/2016   Cervicalgia of occipito-atlanto-axial region 11/12/2016   Moderate persistent asthma 07/29/2015   Current use of beta blocker 07/29/2015   Gastroesophageal reflux disease without esophagitis 07/29/2015   OSA on CPAP 09/19/2014   Diabetic neuropathy with neurologic complication (HCC) 09/19/2014   Myasthenia gravis in remission (HCC) 09/19/2014   Dyspnea 08/13/2014   Erb-Goldflam disease (HCC) 07/27/2014   Cancer of thyroid  (HCC) 07/27/2014   OSA (obstructive sleep apnea) 07/24/2014   DM (diabetes mellitus) type II uncontrolled with eye manifestation (HCC) 06/01/2014   Follicular thyroid  cancer (HCC) 06/01/2014   Nocturia more than twice per night 06/01/2014   Snoring 06/01/2014   Insomnia due to medical condition 06/01/2014   Neuroma 01/16/2014   Pseudoclaudication 11/14/2013   Heart palpitations 06/30/2013   Sinus tachycardia 03/29/2013   Myasthenia gravis (HCC) 03/13/2013   Essential hypertension, benign 03/13/2013   Asthma, chronic 03/13/2013    ONSET DATE: 11/15/2023 (referral)   REFERRING DIAG: I63.50 (ICD-10-CM) - Cerebral infarction due to unspecified occlusion or stenosis of unspecified cerebral artery  THERAPY DIAG:   Muscle weakness (generalized)  Unsteadiness on feet  Other lack of coordination  Rationale for Evaluation and Treatment: Rehabilitation  SUBJECTIVE:  SUBJECTIVE STATEMENT: Pt reports doing well. Is frustrated as she was here on time and not checked in. Overall is doing well, has been working on her exercises and feels as though her right leg is getting stronger. No falls.   Pt reports her BG is 189 mg/dL currently.   Pt accompanied by: self  PERTINENT HISTORY: prior left hemispheric TIA and left thalamic infarct, bilateral ICA stents, chronic headaches, diabetes mellitus, hypothyroidism, hypertension and diagnosis of breast cancer August 2024. Presented to the ED on 10/29/2023 with right facial, upper and lower extremity numbness. Reported new onset of dizziness for approximately one week prior to presentation at ED  PAIN:  Are you having pain? Yes: NPRS scale: 6/10 Pain location: headache Pain description: Throbbing, achy   PRECAUTIONS: Fall and Other: Port in R chest   RED FLAGS: None   WEIGHT BEARING RESTRICTIONS: No  FALLS: Has patient fallen in last 6 months? No  LIVING ENVIRONMENT: Lives with: lives alone Lives in: House/apartment Stairs: Yes: External: 2 steps; none Has following equipment at home: Environmental consultant - 2 wheeled, Shower bench, Grab bars, and Ramped entry  PLOF: Requires assistive device for independence  PATIENT GOALS: "I wanna get my foot up without using my hand- I don't want to drag it"   OBJECTIVE:  Note: Objective measures were completed at Evaluation unless otherwise noted.  DIAGNOSTIC FINDINGS: MRI of brain from 10/29/2023  IMPRESSION: 1. 5 mm acute infarct within the dorsal pons on the right. 2. Chronic lacunar infarcts and chronic small ischemic  changes elsewhere within the pons, new from the prior brain MRI of 09/17/2021. 3. Chronic lacunar infarcts within the bilateral thalami, also new from the prior MRI. 4. Few nonspecific punctate chronic microhemorrhages within the supratentorial brain. 5. Mild paranasal sinus disease as described.  COGNITION: Overall cognitive status: Within functional limits for tasks assessed                                  Vitals:   12/23/23 1245  BP: (!) 140/67  Pulse: 95                                                                               TREATMENT:    Self-care/home management  Assessed vitals (see above) in L forearm and vitals WFL.  Physical Performance   [facility] PT Assessment - 12/23/23 1250       Transfers   Five time sit to stand comments  16.84s   no UE support     Ambulation/Gait   Gait velocity 32.8' over 14.56s = 2.25 ft/s w/RW      Standardized Balance Assessment   Standardized Balance Assessment 10 meter walk test    10 Meter Walk 0.68 m/s w/RW   14.56s             Ther Act  Finalized HEP (see bolded below) for functional hip strength and improved R step clearance:  Fwd/lateral/retro monster walks w/red theraband around distal quads, x4 reps each direction at ballet bar. Pt able to don/doff resistance band independently. Min cues for improved eccentric control  Reviewed goal outcomes and informed  pt of how to obtain new PT referral in future when she decides to return. Pt verbalized understanding.     Gait Gait pattern: decreased hip/knee flexion- Right, decreased ankle dorsiflexion- Right, and poor foot clearance- Right Distance walked: Various clinic distances  Assistive device utilized: Walker - 2 wheeled Level of assistance: Modified independence Comments: foot drag that increases with onset of fatigue   PATIENT EDUCATION: Education details: Updates to HEP, plan to DC next session (pt request), info to inquire about Baclofen if tone continues to  interfere w/ADLs Person educated: Patient Education method: Explanation, Demonstration, and Handouts Education comprehension: verbalized understanding and returned demonstration  HOME EXERCISE PROGRAM: Access Code: [email or web address] URL: [email or web address] Date: 12/14/2023 Prepared by: Burleigh Carp Kajuan Guyton  Exercises - Sit to Stand Without Arm Support  - 1 x daily - 7 x weekly - 2 sets - 8-10 reps - Seated march over  - 1 x daily - 7 x weekly - 3 sets - 10 reps - Supine Heel Slides  - 1 x daily - 7 x weekly - 2 sets - 6-8 reps - Modified Thomas Stretch  - 1 x daily - 7 x weekly - 2-5 minutes hold - Side Stepping with Resistance at [employer] and Counter Support  - 1 x daily - 7 x weekly - 3 sets - 10 reps - Forward Backward Monster Walk with Band at [employer] and Counter Support  - 1 x daily - 7 x weekly - 3 sets - 10 reps  GOALS: Goals reviewed with patient? Yes  STG = LTG DUE TO POC LENGTH   LONG TERM GOALS: Target date: 12/22/2023   Pt will be independent with final HEP for improved strength, balance, transfers and gait.  Baseline:  Goal status: MET  2.  Pt will improve 5 x STS to less than or equal to 18 seconds without UE support to demonstrate improved functional strength and transfer efficiency.   Baseline: 21.84s w/intermittent UE support; 16.84s w/no UE support   Goal status: MET  3.  Patient will improve gait speed to 0.5 m/s with LRAD to indicate improvement to the level of limited community ambulator in order to participate more easily in activities outside of the home.   Baseline: 0.39 m/s with 2WW; 0.68 m/s w/RW  Goal status: MET   4.  Pt will report ability to bring RLE into/out of bed without use of strap for improved functional RLE strength  Baseline:  Goal status: NOT MET    ASSESSMENT:  CLINICAL IMPRESSION: Emphasis of skilled PT session on LTG assessment, finalizing HEP and DC from PT. Pt has met 3 of 4 LTGs, demonstrating independence of HEP and  significantly improving her gait speed and time on 5x STS, indicative of improved functional BLE strength, balance and endurance. Pt continues to require leg strap to bring RLE into bed, but is working on performing without strap. Pt continues to require RW for mobility due to R foot drop but has improved her step clearance since starting PT and would like to DC from PT to work on her exercises at home. Pt to return to PT in a few months when she is ready to resume.   OBJECTIVE IMPAIRMENTS: Abnormal gait, decreased activity tolerance, decreased balance, decreased endurance, decreased mobility, difficulty walking, decreased strength, impaired sensation, impaired UE functional use, impaired vision/preception, and pain  ACTIVITY LIMITATIONS: carrying, lifting, bending, standing, stairs, and locomotion level  PARTICIPATION LIMITATIONS: driving, shopping, community activity, and yard work  PERSONAL FACTORS: Past/current experiences, Transportation,  and 1-2 comorbidities: Previous CVA and double vision  are also affecting patient's functional outcome.   REHAB POTENTIAL: Good  CLINICAL DECISION MAKING: Evolving/moderate complexity  EVALUATION COMPLEXITY: Moderate  PLAN:  PT FREQUENCY: 1-2x/week  PT DURATION: 4 weeks  PLANNED INTERVENTIONS: 97164- PT Re-evaluation, 97110-Therapeutic exercises, 97530- Therapeutic activity, 97112- Neuromuscular re-education, 97535- Self Care, 16109- Manual therapy, [phone]- Gait training, [phone]- Orthotic Fit/training, [phone]- Aquatic Therapy, [phone]- Electrical stimulation (manual), Patient/Family education, Balance training, Stair training, Dry Needling, Joint mobilization, Vestibular training, and DME instructions    Curtistine Downer Dream Nodal, PT, DPT  12/23/2023, 1:11 PM

## 2023-12-23 NOTE — Therapy (Signed)
 OUTPATIENT OCCUPATIONAL THERAPY NEURO TREATMENT & DISCHARGE SUMMARY  Patient Name: [name] MRN: [id number] DOB:Sep 21, 1950, 73 y.o., female Today's Date: 12/23/2023  PCP: Pete Brand, DO REFERRING PROVIDER: Sterling Eisenmenger, PA-C  END OF SESSION:  OT End of Session - 12/23/23 1304     Visit Number 6    Number of Visits 12    Authorization Type [facility] Medicare 2025 Auth# [id number] 11/24/23 - 12/22/23 9 OT visits    Authorization Time Period VL: [place] - Auth# [id number] 11/24/23 - 12/22/23 9 OT visits    Authorization - Number of Visits 9    OT Start Time 1310    OT Stop Time 1403    OT Time Calculation (min) 53 min    Activity Tolerance Patient tolerated treatment well    Behavior During Therapy WFL for tasks assessed/performed            Past Medical History:  Diagnosis Date   Arthritis    "all over my body" (03/13/2013)   Asthma    Asymptomatic carotid artery stenosis, bilateral 10/08/2018   Breast cancer (HCC)    GERD (gastroesophageal reflux disease)    H/O hiatal hernia    Headache    pt states she has had headaches for about 6 months "off and on"   Heart murmur    pt had echocardiogram on 09/17/21   Hypercholesterolemia 10/08/2018   Hypertension    Hypothyroidism    Myasthenia gravis (HCC)    "in my eyes; diagnsosed > 7 yr ago" (03/13/2013)   Sleep apnea    on CPAP   Stroke (HCC) 2021   Thyroid  carcinoma (HCC)    Type II diabetes mellitus (HCC)    Past Surgical History:  Procedure Laterality Date   ABDOMINAL HYSTERECTOMY     ANTERIOR CERVICAL DECOMP/DISCECTOMY FUSION     "I've had severa ORs; always went in from the front" (03/13/2013)   APPENDECTOMY     BREAST LUMPECTOMY WITH RADIOACTIVE SEED AND SENTINEL LYMPH NODE BIOPSY Left 09/02/2022   Procedure: LEFT BREAST LUMPECTOMY WITH RADIOACTIVE SEED AND SENTINEL LYMPH NODE BIOPSY;  Surgeon: Oza Blumenthal, MD;  Location: [facility] OR;  Service: General;  Laterality: Left;   CARDIAC CATHETERIZATION     "several"  (03/13/2013)   CARPAL TUNNEL RELEASE Right    CATARACT EXTRACTION W/ INTRAOCULAR LENS  IMPLANT, BILATERAL Bilateral    CHOLECYSTECTOMY     KNEE ARTHROSCOPY Left    SHOULDER ARTHROSCOPY W/ ROTATOR CUFF REPAIR Left    TONSILLECTOMY     TOTAL THYROIDECTOMY     TRANSCAROTID ARTERY REVASCULARIZATION  Left 09/24/2021   Procedure: LEFT TRANSCAROTID ARTERY REVASCULARIZATION;  Surgeon: Carlene Che, MD;  Location: [facility] OR;  Service: Vascular;  Laterality: Left;   TRANSCAROTID ARTERY REVASCULARIZATION  Right 10/23/2021   Procedure: Right Transcarotid Artery Revascularization;  Surgeon: Carlene Che, MD;  Location: [facility] OR;  Service: Vascular;  Laterality: Right;   ULTRASOUND GUIDANCE FOR VASCULAR ACCESS Right 09/24/2021   Procedure: ULTRASOUND GUIDANCE FOR VASCULAR ACCESS;  Surgeon: Carlene Che, MD;  Location: [facility] OR;  Service: Vascular;  Laterality: Right;   ULTRASOUND GUIDANCE FOR VASCULAR ACCESS Right 10/23/2021   Procedure: ULTRASOUND GUIDANCE FOR VASCULAR ACCESS, LEFT FEMORAL VEIN;  Surgeon: Carlene Che, MD;  Location: [facility] OR;  Service: Vascular;  Laterality: Right;   Patient Active Problem List   Diagnosis Date Noted   Nausea and vomiting 11/06/2023   Leukocytosis 11/06/2023   SIRS (systemic inflammatory response syndrome) (HCC) 11/06/2023  Right pontine stroke (HCC) 11/02/2023   Stroke (HCC) 10/29/2023   Peripheral neuropathy 11/03/2022   Port-A-Cath in place 10/21/2022   S/P breast lumpectomy 09/02/2022   S/P lumpectomy, left breast 09/02/2022   Malignant neoplasm of upper-outer quadrant of left breast in female, estrogen receptor positive (HCC) 08/10/2022   Asymptomatic carotid artery stenosis without infarction, right 10/23/2021   Carotid stenosis 10/23/2021   Stroke-like symptoms    TIA (transient ischemic attack)    Hypokalemia 09/17/2021   Right facial numbness 09/16/2021   Insulin  dependent type 2 diabetes mellitus (HCC) 09/16/2021   Hypothyroidism 09/16/2021    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HCC) 12/27/2019   Primary hypertension    Uncomplicated asthma    Uncontrolled type 2 diabetes mellitus with hyperglycemia (HCC)    Thalamic stroke (HCC) 12/20/2019   Bilateral carotid artery stenosis 10/08/2018   Hyperlipidemia associated with type 2 diabetes mellitus (HCC) 10/08/2018   Headache around the eyes 11/12/2016   Cervicalgia of occipito-atlanto-axial region 11/12/2016   Moderate persistent asthma 07/29/2015   Current use of beta blocker 07/29/2015   Gastroesophageal reflux disease without esophagitis 07/29/2015   OSA on CPAP 09/19/2014   Diabetic neuropathy with neurologic complication (HCC) 09/19/2014   Myasthenia gravis in remission (HCC) 09/19/2014   Dyspnea 08/13/2014   Erb-Goldflam disease (HCC) 07/27/2014   Cancer of thyroid  (HCC) 07/27/2014   OSA (obstructive sleep apnea) 07/24/2014   DM (diabetes mellitus) type II uncontrolled with eye manifestation (HCC) 06/01/2014   Follicular thyroid  cancer (HCC) 06/01/2014   Nocturia more than twice per night 06/01/2014   Snoring 06/01/2014   Insomnia due to medical condition 06/01/2014   Neuroma 01/16/2014   Pseudoclaudication 11/14/2013   Heart palpitations 06/30/2013   Sinus tachycardia 03/29/2013   Myasthenia gravis (HCC) 03/13/2013   Essential hypertension, benign 03/13/2013   Asthma, chronic 03/13/2013    ONSET DATE: Referral: 11/16/2023 Hospitalized beginning 10/29/23  REFERRING DIAG: I63.50 (ICD-10-CM) - Cerebral infarction due to unspecified occlusion or stenosis of unspecified cerebral artery  THERAPY DIAG:  Muscle weakness (generalized)  Other lack of coordination  Other disturbances of skin sensation  Other symptoms and signs involving the nervous system  Pain in right upper arm  Rationale for Evaluation and Treatment: Rehabilitation  SUBJECTIVE:   SUBJECTIVE STATEMENT:  Pt accompanied by: self  Pt reported she was a bit tired today and usually rests after lunch.  Pt  reports using heat still for her shoulder and hand.  She reports she is ready for DC from therapy today.  PERTINENT HISTORY:   Presented to the ED [facility] on 10/29/2023 with right facial, upper and lower extremity numbness. Reported new onset of dizziness for approximately one week prior to presentation at ED. MRI was positive for 5 mm acute infarct within the dorsal pons on the right.   PMHx: prior left hemispheric TIA and left thalamic infarct, bilateral ICA stents, chronic headaches, diabetes mellitus, hypothyroidism, hypertension and diagnosis of breast cancer August, 2024. H/O ocular myasthenia gravis and a history of thyroid  cancer status post radiation and total thyroidectomy. She has an insulin  pump which will be reinitiated on admission to CIR.  Inpatient rehab OT note:  Patient has met 9 of 9 long term goals due to improved activity tolerance, improved balance, ability to compensate for deficits, functional use of  RIGHT upper and RIGHT lower extremity, and improved coordination.  Patient to discharge at overall Modified Independent level.  Patient's care partner is independent to provide the necessary physical assistance at discharge. Pt's  sisters plan to accompany pt at DC for first several days/weeks and attended education regarding functional transfer and ADL recommendations. Recommendation:  Patient will benefit from ongoing skilled OT services in outpatient setting to continue to advance functional skills in the area of BADL, iADL, and restore RUE functional use .  PRECAUTIONS: Fall  WEIGHT BEARING RESTRICTIONS: No  PAIN:  Are you having pain? Pt reports her RUE pain has eased off a whole lot.   FALLS: Has patient fallen in last 6 months? No  LIVING ENVIRONMENT: Lives with: lives alone Lives in: House/apartment Stairs: Yes: External: 2 steps; has ramp to cover steps, home is 1 level Has following equipment at home: Otho Blitz - 2 wheeled, Shower bench, and Grab bars  PLOF:  Independent with basic ADLs, Independent with household mobility with device, Independent with community mobility with device, and Needs assistance with homemaking (nephew came ~ every 2 weeks)  PATIENT GOALS: Use right arm better and decreased pain in arm  OBJECTIVE:  Note: Objective measures were completed at Evaluation unless otherwise noted.  HAND DOMINANCE: Right  ADLs:  Overall ADLs: Mod Ind Transfers/ambulation related to ADLs: AE - Pt is using a leg lifter to get her R leg in/out of bed and RW for trnasfers Eating: Ind Grooming: Ind UB Dressing: Ind LB Dressing: Uses grabber to help Toileting: Mod Ind Bathing: Mod Ind Tub Shower transfers: Mod Ind Equipment: Emergency planning/management officer, Reacher, and Long handled sponge  IADLs: Shopping: Gives list to her sister Light housekeeping: Nephew was coming every 2 weeks Meal Prep: Uses stove to cook - not microwave Community mobility: RW Medication management: Uses pillbox and sorts it herself Landscape architect: Ind Handwriting: 90% legible  MOBILITY STATUS: Independent  POSTURE COMMENTS:  rounded shoulders and forward head Sitting balance: WFL  ACTIVITY TOLERANCE: Activity tolerance: Good  FUNCTIONAL OUTCOME MEASURES: Eval Quick Dash: 29.5 DC 12/23/23 Quick Dash: 20.5 %  UPPER EXTREMITY ROM:    Active ROM Right eval Right DC Left eval  Shoulder flexion <90 100* WFL  Shoulder abduction     Shoulder adduction     Shoulder extension     Shoulder internal rotation Limited    Shoulder external rotation     Elbow flexion Gets it there but slow WFL   Elbow extension     Wrist flexion     Wrist extension     Wrist ulnar deviation     Wrist radial deviation     Wrist pronation     Wrist supination     (Blank rows = not tested)  HAND FUNCTION: Grip strength: Right: 16.5, 17.6, 18.5 lbs; Left: 59.3, 61.9, 66.1 lbs Average: Right: 17.5 lbs Left: 62.4 lbs  12/21/23: Right: 54.4, 59.9, 57.0 = Average 57.1  lbs  COORDINATION: 9 Hole Peg test: Right: 23.89 sec; Left: 27.68 sec Box and Blocks:  Right 48 blocks, Left 51 blocks - hurts a little/tired + R hand shakes/tremulous  DC 12/23/23 Box and Blocks: Right 51 blocks   SENSATION: Light touch: Impaired Pt reported to P.T. that  she had a lot of burning tingling in her legs at the hospital, thinks it was a side effect of a medication because it has gotten better since being home. Still has numbness of her hands   12/21/23 - L side feels normal, R side is still dull and the R pinkie is completely numb up into the hand sometimes.  Still has tingling in her feet.  Sensation per recent hospitalization:  Light Touch  Impaired Details: Impaired RLE Hot/Cold: Not tested Proprioception: Appears Intact Stereognosis: Not tested Additional Comments: decreased sensation along RLE with tingling in R lower leg and foot. Coordination Gross Motor Movements are Fluid and Coordinated: No Fine Motor Movements are Fluid and Coordinated: Yes Coordination and Movement Description: grossly uncoordinated due to R hemiparesis with visual impairments Finger Nose Finger Test: slow Heel Shin Test: unable to perform on RLE, slow and decresaed ROM on LLE  EDEMA: NA  MUSCLE TONE: WFL  COGNITION: Overall cognitive status:  No changes by pt report BIMS Summary Score: 15  VISION: Subjective report: Pt reports double vision and floaters for years d/t Mysathenia gravis in eyes, hasn't driven since first stroke in 2020 Baseline vision: Bifocals Visual history: cataracts and myasthenia gravis and caratact sx both  VISION ASSESSMENT: Not tested  Patient has difficulty with following activities due to following visual impairments: has double vision  PERCEPTION: Not tested  PRAXIS: Not tested  OBSERVATIONS: Pt ambulates with RW with no loss of balance. The pt is well kept.  OTR notes R hand shaking and tremulous with UE coordination testing.                                                                                                                           TREATMENT :    Therapeutic Exercises - Therapeutic exercises completed focusing on UE strengthening and range of motion (ROM) exercises necessary for ADLs, IADLs and continued decreased tremors with UE reaching etc  NEW Exercises added to her HEP and printed for her to take home Shoulder exercises progressed from table slides (flexion/scaption ie) at at "angle") to wall slides (in the shower) to shoulder presses (initially without object to small objects ie) plastic cups to cabinet, canned foods to pantry etc) - Seated Shoulder Flexion Towel Slide at Table Top  - 1 x daily - 10 reps - Shoulder Flexion Wall Slide with Towel  - 1 x daily - 10 reps - Seated Single Arm Shoulder Press with Dumbbell  - 1 x daily - 10 reps Additional exercises included  - Seated Wrist Supination Pronation with Can  - 1 x daily - 10 reps - Seated Single Arm Bicep Curls with Dumbbell  - 1 x daily - 10 reps  Pt encouraged to work on reaching UE out to the side ie) moving can of food from midline out to the side and/or pulling her putty way out to the side as this is where she occasionally notices a small tremor at times.   Pt able to perform all motions without difficulty or increased discomfort.    Instructed pt to vary activities day to day but to continue daily activity for a couple of UE tasks/day.  Therapeutic Activities   Discharge instructions completed to day for HEPs, sensory stimulation and progress over OT POC.   Box and Blocks test redone with slight improvement from 48 blocks at eval to 51 blocks today but with functional improvements  ie) task did not hurt RUE like it did at eval and even though she still felt a little tired, she could have kept going and OTR did not notice R hand shaking or being tremulous   Reiterated diplopia HEP may still be beneficial in strengthening ocular muscles even if not  correcting double vision.   Reviewed 5 main sensory precautions - hot/cold, sharp/breakable, heavy and chemical for safety with dull RUE sensation and numbness of 5th digit.  Pt recalls hot/cold with ease and other options with min cues.  Pt education provided on sensory re-education in an attempt to retrain or stimulate sensory pathways. Techniques suggested include: touching different textured objects, massage, vibration, pressure, determining joint position, identifying different temperatures.  Reiterated previous activity with rice bin etc.   PATIENT EDUCATION: Education details: DC instructions and UE HEP Person educated: Patient Education method: Explanation, Demonstration, Tactile cues, and Verbal cues Education comprehension: verbalized understanding, returned demonstration, verbal cues required, tactile cues required, and needs further education  HOME EXERCISE PROGRAM: 11/24/23: Tendon Gliding Exercises 12/07/23: Putty Activities Access Code: [email or web address] 12/14/23: Coordination Activities 12/16/2023: Heat; Diplopia; Tremor reduction strategies 12/23/23: UE ROM/strength - same access code  GOALS: Goals reviewed with patient? Yes   LONG TERM GOALS: Target date: 12/31/23  Patient will demonstrate updated R UE HEP with visual handouts only for proper execution. Baseline: New to outpt OT  Goal status: MET  2.  Patient will demonstrate at least 20+ lbs RUE grip strength as needed to open jars and other containers. Baseline: Right: 17.5 lbs Left: 62.4 lbs Goal status: MET 12/21/23 Right: 54.4, 59.9, 57.0 = Average 57.1 lbs  3.  Pt will verbalize understanding of adapted strategies and/or equipment to address tremors of RUE with ADLs and IADLs. Baseline: New to outpt OT  Goal status: MET 12/23/23 Much more reduced - may notice it with reaching but not often  4.  Pt will be able to place at least 50+ blocks using right hand with completion of Box and Blocks test without discomfort. Baseline:   Right 48 blocks - hurts/tired, Left 51 blocks  Goal status: MET 12/23/23 Right: 51 blocks  5. Pt will independently recall the 5 main sensory precautions (cold, heat, sharp, chemical, and heavy) as needed to prevent injury/harm secondary to impairments.    Baseline: R UE dull and numb  Goal Status: MET  12/23/23 Pt recalls hot/cold easily and reiterated others with min cues  6.  Patient will demonstrate any improvement with quick Dash score (reporting 25% disability or less) indicating improved functional use of right arm better and decreased pain in RUE. Baseline: 29.5 Goal status: MET 12/23/23: 20.5%  ASSESSMENT:  CLINICAL IMPRESSION: Pt verbalizes good understanding of HEPs and general recommendations to continue UE ROM/strengthening at home, maximize sensory awareness as well as safety and independence with ADL and IADL completion. Patient is appropriate for discharge and no longer demonstrates medical necessity for continued skilled occupational therapy services.  PERFORMANCE DEFICITS: in functional skills including ADLs, IADLs, coordination, dexterity, proprioception, sensation, edema, tone, ROM, strength, pain, fascial restrictions, muscle spasms, flexibility, Fine motor control, Gross motor control, mobility, balance, decreased knowledge of precautions, decreased knowledge of use of DME, vision, and UE functional use, cognitive skills including problem solving, and psychosocial skills including coping strategies, environmental adaptation, and routines and behaviors.   IMPAIRMENTS: are limiting patient from ADLs, IADLs, and leisure.   CO-MORBIDITIES: has co-morbidities such as breast cancer and prior stroke  that affects occupational performance. Patient will benefit from  skilled OT to address above impairments and improve overall function.  REHAB POTENTIAL: Good  PLAN: OCCUPATIONAL THERAPY DISCHARGE SUMMARY  Visits from Start of Care: 6  Current functional level related to goals /  functional outcomes: Pt has met all goals to satisfactory levels and is pleased with outcomes.   Remaining deficits: Pt has minimal functional deficits and pain.   Education / Equipment: Pt has all needed materials and education. Pt understands how to continue on with self-management. See tx notes for more details.   Patient agrees to discharge due to max benefits received from outpatient occupational therapy at this time.     Zora Hires, OT 12/23/2023, 2:35 PM

## 2024-01-04 ENCOUNTER — Telehealth: Payer: Self-pay | Admitting: Adult Health

## 2024-01-04 NOTE — Telephone Encounter (Signed)
 I have not received an order from them to be sign. [employer] has been working with them and looked like everything was good. I will see if [employer] says there is anything else needed

## 2024-01-04 NOTE — Telephone Encounter (Signed)
 [name] from [facility]  called in regards to a form order for Pt Cpap Machine. [name] stated order needs to be signed by Provider and faxed back to [facility] .  Callback # [phone] Fax# [phone]

## 2024-01-06 ENCOUNTER — Encounter: Admitting: Physical Medicine and Rehabilitation

## 2024-01-06 NOTE — Telephone Encounter (Signed)
 [name] from [facility]   Has called in response to his 5-13 call, the response to the message from RN was relayed to him.  [name] will refax the request today and f/u in 2 days

## 2024-01-07 ENCOUNTER — Other Ambulatory Visit: Payer: Self-pay

## 2024-01-07 DIAGNOSIS — G7000 Myasthenia gravis without (acute) exacerbation: Secondary | ICD-10-CM | POA: Diagnosis not present

## 2024-01-07 DIAGNOSIS — Z5181 Encounter for therapeutic drug level monitoring: Secondary | ICD-10-CM | POA: Diagnosis not present

## 2024-01-07 DIAGNOSIS — J4540 Moderate persistent asthma, uncomplicated: Secondary | ICD-10-CM | POA: Diagnosis not present

## 2024-01-07 DIAGNOSIS — R799 Abnormal finding of blood chemistry, unspecified: Secondary | ICD-10-CM | POA: Diagnosis not present

## 2024-01-13 DIAGNOSIS — E1165 Type 2 diabetes mellitus with hyperglycemia: Secondary | ICD-10-CM | POA: Diagnosis not present

## 2024-01-13 DIAGNOSIS — E7800 Pure hypercholesterolemia, unspecified: Secondary | ICD-10-CM | POA: Diagnosis not present

## 2024-01-13 DIAGNOSIS — Z853 Personal history of malignant neoplasm of breast: Secondary | ICD-10-CM | POA: Diagnosis not present

## 2024-01-13 DIAGNOSIS — R92322 Mammographic fibroglandular density, left breast: Secondary | ICD-10-CM | POA: Diagnosis not present

## 2024-01-13 DIAGNOSIS — I10 Essential (primary) hypertension: Secondary | ICD-10-CM | POA: Diagnosis not present

## 2024-01-13 DIAGNOSIS — E890 Postprocedural hypothyroidism: Secondary | ICD-10-CM | POA: Diagnosis not present

## 2024-01-13 DIAGNOSIS — E559 Vitamin D deficiency, unspecified: Secondary | ICD-10-CM | POA: Diagnosis not present

## 2024-01-13 DIAGNOSIS — N6321 Unspecified lump in the left breast, upper outer quadrant: Secondary | ICD-10-CM | POA: Diagnosis not present

## 2024-01-18 ENCOUNTER — Ambulatory Visit: Admitting: Physical Therapy

## 2024-01-18 ENCOUNTER — Encounter: Admitting: Occupational Therapy

## 2024-01-18 ENCOUNTER — Other Ambulatory Visit: Payer: Self-pay | Admitting: Neurology

## 2024-01-19 ENCOUNTER — Telehealth: Payer: Self-pay

## 2024-01-19 ENCOUNTER — Other Ambulatory Visit: Payer: Self-pay

## 2024-01-19 ENCOUNTER — Telehealth: Payer: Self-pay | Admitting: Hematology

## 2024-01-19 NOTE — Telephone Encounter (Signed)
 Pt called c/o about the Exemestane .  Pt called stating she's having peripheral edema especially in her lower extremities, bruising on her abdomen, constant headaches, and excessive hair loss.  Pt stated she had a stroke sometime in February/March 2025 and was last seen by Dr. Maryalice Smaller on 04/1/02025 at which time Dr. Maryalice Smaller took the pt off Tamoxifen  d/t one of the side effects of Tamoxifen  is stroke.  Pt stated her providers at [facility] said one of the medications they prescribed to the pt could also cause strokes but pt does not think that medication was the cause of her stroke.  Pt stated she would like to speak with Dr. Maryalice Smaller regarding the Exemestane .  Stated this nurse will make Dr. Maryalice Smaller aware of the pt's concerns and will f/u with pt on Dr. Candise Chambers response.  Pt verbalized understanding and stated she will await a return call.

## 2024-01-20 ENCOUNTER — Ambulatory Visit: Admitting: Physical Therapy

## 2024-01-20 ENCOUNTER — Encounter: Admitting: Occupational Therapy

## 2024-01-20 DIAGNOSIS — E113511 Type 2 diabetes mellitus with proliferative diabetic retinopathy with macular edema, right eye: Secondary | ICD-10-CM | POA: Diagnosis not present

## 2024-01-20 DIAGNOSIS — Z961 Presence of intraocular lens: Secondary | ICD-10-CM | POA: Diagnosis not present

## 2024-01-20 DIAGNOSIS — H3500 Unspecified background retinopathy: Secondary | ICD-10-CM | POA: Diagnosis not present

## 2024-01-20 DIAGNOSIS — H469 Unspecified optic neuritis: Secondary | ICD-10-CM | POA: Diagnosis not present

## 2024-01-20 DIAGNOSIS — I69351 Hemiplegia and hemiparesis following cerebral infarction affecting right dominant side: Secondary | ICD-10-CM | POA: Diagnosis not present

## 2024-01-20 DIAGNOSIS — E113312 Type 2 diabetes mellitus with moderate nonproliferative diabetic retinopathy with macular edema, left eye: Secondary | ICD-10-CM | POA: Diagnosis not present

## 2024-01-20 DIAGNOSIS — I10 Essential (primary) hypertension: Secondary | ICD-10-CM | POA: Diagnosis not present

## 2024-01-20 DIAGNOSIS — C50919 Malignant neoplasm of unspecified site of unspecified female breast: Secondary | ICD-10-CM | POA: Diagnosis not present

## 2024-01-21 ENCOUNTER — Other Ambulatory Visit: Payer: Self-pay | Admitting: Adult Health

## 2024-01-21 ENCOUNTER — Inpatient Hospital Stay: Attending: Hematology | Admitting: Nurse Practitioner

## 2024-01-21 DIAGNOSIS — Z9641 Presence of insulin pump (external) (internal): Secondary | ICD-10-CM | POA: Diagnosis not present

## 2024-01-21 DIAGNOSIS — I10 Essential (primary) hypertension: Secondary | ICD-10-CM | POA: Diagnosis not present

## 2024-01-21 DIAGNOSIS — E7800 Pure hypercholesterolemia, unspecified: Secondary | ICD-10-CM | POA: Diagnosis not present

## 2024-01-21 DIAGNOSIS — Z1732 Human epidermal growth factor receptor 2 negative status: Secondary | ICD-10-CM

## 2024-01-21 DIAGNOSIS — E559 Vitamin D deficiency, unspecified: Secondary | ICD-10-CM | POA: Diagnosis not present

## 2024-01-21 DIAGNOSIS — Z170 Estrogen receptor positive status [ER+]: Secondary | ICD-10-CM | POA: Diagnosis not present

## 2024-01-21 DIAGNOSIS — Z1722 Progesterone receptor negative status: Secondary | ICD-10-CM

## 2024-01-21 DIAGNOSIS — C50412 Malignant neoplasm of upper-outer quadrant of left female breast: Secondary | ICD-10-CM | POA: Diagnosis not present

## 2024-01-21 DIAGNOSIS — G43009 Migraine without aura, not intractable, without status migrainosus: Secondary | ICD-10-CM

## 2024-01-21 DIAGNOSIS — E890 Postprocedural hypothyroidism: Secondary | ICD-10-CM | POA: Diagnosis not present

## 2024-01-21 DIAGNOSIS — Z85850 Personal history of malignant neoplasm of thyroid: Secondary | ICD-10-CM | POA: Diagnosis not present

## 2024-01-21 DIAGNOSIS — E1165 Type 2 diabetes mellitus with hyperglycemia: Secondary | ICD-10-CM | POA: Diagnosis not present

## 2024-01-21 MED ORDER — ANASTROZOLE 1 MG PO TABS: 1.0000 mg | ORAL_TABLET | Freq: Every day | ORAL | 0 refills | Status: DC

## 2024-01-21 NOTE — Progress Notes (Signed)
 Patient Care Team: Pete Brand, DO as PCP - General (Family Medicine) Lisabeth Rider, MD as Consulting Physician (Neurology) Johny Nap, NP as Nurse Practitioner (Neurology) Alane Hsu, RN as Oncology Nurse Navigator Auther Bo, RN as Oncology Nurse Navigator Oza Blumenthal, MD as Consulting Physician (General Surgery) Sonja [place], MD as Consulting Physician (Hematology) Johna Myers, MD as Consulting Physician (Radiation Oncology) Nettie Barb, MD as Consulting Physician (Rheumatology) Mammography, [facility] as Consulting Physician (Diagnostic Radiology) Burton, Lacie K, NP as Nurse Practitioner (Nurse Practitioner)  Clinic Day:  01/30/2024  Referring physician: Pete Brand, DO  I connected with [name] on 01/30/24 at  2:00 PM EDT by telephone and verified that I am speaking with the correct person using two identifiers.   I discussed the limitations, risks, security and privacy concerns of performing an evaluation and management service by telemedicine and the availability of in-person appointments. I also discussed with the patient that there may be a patient responsible charge related to this service. The patient expressed understanding and agreed to proceed.   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 -she completed adjuvant chemo with weekly abraxane  X12 in 01/2023. Due to her neuropathy, chemo dose  reduced.  -She completed adjuvant radiation in August 2024 -Her bone density scan in August 2024 showed multiple sites of osteopenia with T-score -2.3, high risk of hip fracture is 3% in the next 10 years. -The role of adjuvant antiestrogen therapy was discussed. Given her high risk of hip fracture from osteopenia, I recommended tamoxifen  for 5 years, giving weak ER positivity. She started in 04/2023 -she was switched to exemestane  on 12/02/2023 due to recent stroke.  - Unable to tolerate exemestane  due to joint pain.  Willing to try different medication to prevent recurrence of metastatic disease. - Trial anastrozole  1 mg daily. - Labs with follow-up in July 2025 as scheduled.    Recent stroke Patient switched from tamoxifen  to exemestane  in April due to recent stroke.  She was unable to tolerate  this medication. Caused her severe joint  pain. Discussed anti-inflammatories, letrozole, and anastrozole .  Reviewed potential negative side effects such as joint pain, myalgias, hot flashes, night sweats, and bone loss.  Both have some increased risk for stroke however risk is less than tamoxifen .  After careful consideration, will start anastrozole  1 mg daily.  Will check labs and follow-up with patient as scheduled in July 2025.  The patient understands the plans discussed today and is in agreement with them.  She knows to contact our office if she develops concerns prior to her next appointment.  I provided 10 minutes of face-to-face time during this encounter and > 50% was spent counseling as documented under my assessment and plan.    Sharyon Deis, NP  [facility] - A DEPT OF [facility] [address] [place] [address] Dept: [phone] Dept Fax: [phone]   No orders  of the defined types were placed in this encounter.     CHIEF COMPLAINT:  CC: left breast cancer, estrogen receptor positive   Current  Treatment: exemestane  starting 12/02/2023. Was on tamoxifen  from 04/2023 through 11/2023. Switched due to stroke.    INTERVAL HISTORY:  [name] is here today for repeat clinical assessment. She last saw Dr. Maryalice Smaller 12/02/2023. Was switched from tamoxifen  to exemestane  due to recent stroke. Had to stop exemestane  due to joint pain and myalgia. Willing to start different medication to prevent recurrence or metastatic breast cancer. She denies fevers or chills. She denies pain. Her appetite is good.   I have reviewed the past medical history, past surgical history, social history and family history with the patient and they are unchanged from previous note.  ALLERGIES:  is allergic to contrast media [iodinated contrast media], fluorescein, iodine , magnesium -containing compounds, molds & smuts, shellfish-derived products, pholcodine, azithromycin, codeine, metformin hcl er, oxycodone , tramadol  hcl, and other.  MEDICATIONS:  Current Outpatient Medications  Medication Sig Dispense Refill   anastrozole  (ARIMIDEX ) 1 MG tablet Take 1 tablet (1 mg total) by mouth daily. 90 tablet 0   acetaminophen  (TYLENOL ) 325 MG tablet Take 1-2 tablets (325-650 mg total) by mouth every 4 (four) hours as needed for mild pain (pain score 1-3).     clopidogrel  (PLAVIX ) 75 MG tablet Take 1 tablet (75 mg total) by mouth daily. 30 tablet 1   diphenoxylate -atropine  (LOMOTIL ) 2.5-0.025 MG tablet Take 1-2 tablets by mouth 4 (four) times daily as needed for diarrhea or loose stools. 30 tablet 2   docusate sodium  (COLACE) 100 MG capsule Take 1 capsule (100 mg total) by mouth daily. 30 capsule 0   ELDERBERRY PO Take 1 tablet by mouth daily.     empagliflozin  (JARDIANCE ) 25 MG TABS tablet Take 25 mg by mouth daily.     Evolocumab with Infusor (REPATHA PUSHTRONEX SYSTEM) 420 MG/3.5ML SOCT Inject 420 mg into the skin every 30 (thirty) days.     Exenatide ER (BYDUREON BCISE) 2 MG/0.85ML AUIJ Inject 2 mg into the skin every Monday. (Patient not  taking: Reported on 12/02/2023)     ezetimibe  (ZETIA ) 10 MG tablet TAKE 1 TABLET BY MOUTH EVERY DAY 90 tablet 1   Immune Globulin, Human, 40 GM/400ML SOLN Inject into the vein every 6 (six) weeks. Infusions are administered at home by home health nurse - last infusion 09/08/21 and 09/09/21; next infusion due 10/19/21 and 10/20/21     insulin  lispro (HUMALOG) 100 UNIT/ML injection Inject into the skin See admin instructions. Inject subcutaneously 3 (three) times daily with meals With meals through the Omnipod Dash Pump every 72 hours as directed     levothyroxine  (SYNTHROID ) 100 MCG tablet Take 1 tablet (100 mcg total) by mouth daily at 6 (six) AM. (Patient taking differently: Take 200 mcg by mouth daily at 6 (six) AM.)     lidocaine -prilocaine  (EMLA ) cream Apply to affected area once 30 g 3   loperamide (IMODIUM A-D) 2 MG tablet Take 2 mg by mouth 4 (four) times daily as needed for diarrhea or loose stools.     loratadine  (CLARITIN ) 10 MG tablet Take 1 tablet (10 mg total) by mouth at bedtime. 30 tablet 0   meclizine  (ANTIVERT ) 25 MG tablet Take 25 mg by mouth 3 (three) times daily as needed for dizziness or nausea (or migraine-related nausea).     montelukast  (SINGULAIR ) 10 MG tablet Take 1 tablet (10 mg total) by mouth every morning. 30 tablet 0  mycophenolate  (CELLCEPT ) 500 MG tablet Take 1,000 mg by mouth 2 (two) times daily. For myasthenia gravis     ondansetron  (ZOFRAN ) 8 MG tablet TAKE 1 TABLET BY MOUTH EVERY 8  HOURS AS NEEDED FOR NAUSEA AND  VOMITING 60 tablet 1   pantoprazole  (PROTONIX ) 40 MG tablet Take 1 tablet (40 mg total) by mouth 2 (two) times daily. 60 tablet 0   predniSONE  (DELTASONE ) 2.5 MG tablet Take 7.5 mg by mouth every morning. For myasthenia gravis     pregabalin  (LYRICA ) 25 MG capsule Take 1 capsule (25 mg total) by mouth 2 (two) times daily. (Patient taking differently: Take 10 mg by mouth 2 (two) times daily.) 180 capsule 0   PRESCRIPTION MEDICATION Pt uses a cpap nightly      PROAIR  HFA 108 (90 BASE) MCG/ACT inhaler Inhale 2 puffs into the lungs every 6 (six) hours as needed for wheezing or shortness of breath.      prochlorperazine  (COMPAZINE ) 10 MG tablet Take 1 tablet (10 mg total) by mouth every 6 (six) hours as needed for nausea or vomiting. 30 tablet 1   psyllium (HYDROCIL/METAMUCIL) 95 % PACK Take 1 packet by mouth daily. 30 each 0   riTUXimab  (RITUXAN ) 500 MG/50ML injection Inject into the vein every 6 (six) months.     rosuvastatin  (CRESTOR ) 40 MG tablet Take 1 tablet (40 mg total) by mouth daily. 90 tablet 3   tamoxifen  (NOLVADEX ) 20 MG tablet Take 1 tablet (20 mg total) by mouth daily. 90 tablet 1   topiramate  (TOPAMAX ) 50 MG tablet TAKE 1 TABLET BY MOUTH AT  BEDTIME 90 tablet 3   vitamin B-12 (CYANOCOBALAMIN ) 1000 MCG tablet Take 1,000 mcg by mouth once a week.     Vitamin D , Ergocalciferol , (DRISDOL ) 50000 UNITS CAPS capsule Take 50,000 Units by mouth every Monday.      No current facility-administered medications for this visit.    HISTORY OF PRESENT ILLNESS:   Oncology History Overview Note   Cancer Staging  Malignant neoplasm of upper-outer quadrant of left breast in female, estrogen receptor positive (HCC) Staging form: Breast, AJCC 8th Edition - Clinical: Stage IB (cT1b, cN0, cM0, G3, ER+, PR-, HER2-) - Signed by Bettejane Brownie, PA-C on 08/10/2022 Stage prefix: Initial diagnosis Method of lymph node assessment: Clinical Histologic grading system: 3 grade system - Pathologic stage from 09/02/2022: Stage IB (pT1b, pN0, cM0, G2, ER-, PR-, HER2-) - Signed by Sonja Burgaw, MD on 09/23/2022 Stage prefix: Initial diagnosis Histologic grading system: 3 grade system Residual tumor (R): R0 - None     Malignant neoplasm of upper-outer quadrant of left breast in female, estrogen receptor positive (HCC)  08/10/2022 Initial Diagnosis   Malignant neoplasm of upper-outer quadrant of left female breast (HCC)   08/10/2022 Cancer Staging   Staging  form: Breast, AJCC 8th Edition - Clinical: Stage IB (cT1b, cN0, cM0, G3, ER+, PR-, HER2-) - Signed by Bettejane Brownie, PA-C on 08/10/2022 Stage prefix: Initial diagnosis Method of lymph node assessment: Clinical Histologic grading system: 3 grade system   09/02/2022 Cancer Staging   Staging form: Breast, AJCC 8th Edition - Pathologic stage from 09/02/2022: Stage IB (pT1b, pN0, cM0, G2, ER-, PR-, HER2-) - Signed by Sonja Gilbert, MD on 09/23/2022 Stage prefix: Initial diagnosis Histologic grading system: 3 grade system Residual tumor (R): R0 - None   10/21/2022 -  Chemotherapy   Patient is on Treatment Plan : BREAST PACLitaxel -Albumin  (Abraxane ) (100) D1,8,15 q28d     07/28/2023 Survivorship  SCP delivered by Lacie Burton, NP       REVIEW OF SYSTEMS:   Constitutional: Denies fevers, chills or abnormal weight loss Eyes: Denies blurriness of vision Ears, nose, mouth, throat, and face: Denies mucositis or sore throat Respiratory: Denies cough, dyspnea or wheezes Cardiovascular: Denies palpitation, chest discomfort or lower extremity swelling Gastrointestinal:  Denies nausea, heartburn or change in bowel habits Skin: Denies abnormal skin rashes Lymphatics: Denies new lymphadenopathy or easy bruising Neurological:Denies numbness, tingling or new weaknesses Behavioral/Psych: Mood is stable, no new changes  All other systems were reviewed with the patient and are negative.   VITALS:   There were no vitals taken for this visit.  Wt Readings from Last 3 Encounters:  12/02/23 199 lb 9.6 oz (90.5 kg)  11/03/23 190 lb 0.6 oz (86.2 kg)  10/29/23 199 lb 9.6 oz (90.5 kg)    There is no height or weight on file to calculate BMI.  Performance status (ECOG): 1 - Symptomatic but completely ambulatory     LABORATORY DATA:  I have reviewed the data as listed    Component Value Date/Time   NA 140 12/02/2023 0924   NA 143 05/31/2019 1101   K 3.8 12/02/2023 0924   CL 107 12/02/2023  0924   CO2 25 12/02/2023 0924   GLUCOSE 195 (H) 12/02/2023 0924   BUN 15 12/02/2023 0924   BUN 24 05/31/2019 1101   CREATININE 0.93 12/02/2023 0924   CALCIUM  8.4 (L) 12/02/2023 0924   PROT 7.8 12/02/2023 0924   PROT 6.5 05/31/2019 1101   ALBUMIN  3.2 (L) 12/02/2023 0924   ALBUMIN  4.0 05/31/2019 1101   AST 18 12/02/2023 0924   ALT 13 12/02/2023 0924   ALKPHOS 85 12/02/2023 0924   BILITOT 0.3 12/02/2023 0924   GFRNONAA >60 12/02/2023 0924   GFRAA >60 12/28/2019 1159    Lab Results  Component Value Date   WBC 3.7 (L) 12/02/2023   NEUTROABS 2.5 12/02/2023   HGB 9.3 (L) 12/02/2023   HCT 29.1 (L) 12/02/2023   MCV 94.2 12/02/2023   PLT 192 12/02/2023

## 2024-01-21 NOTE — Assessment & Plan Note (Addendum)
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 -she completed adjuvant chemo with weekly abraxane  X12 in 01/2023. Due to her neuropathy, chemo dose reduced.  -She completed adjuvant radiation in August 2024 -Her bone density scan in August 2024 showed multiple sites of osteopenia with T-score -2.3, high risk of hip fracture is 3% in the next 10 years. -The role of adjuvant antiestrogen therapy was discussed. Given her high risk of hip fracture from osteopenia, I recommended tamoxifen  for 5 years, giving weak ER positivity. She started in 04/2023 -she was switched to exemestane  on 12/02/2023 due to recent stroke.  - Unable to tolerate exemestane  due to joint pain.  Willing to try different medication to prevent recurrence of metastatic disease. - Trial anastrozole  1 mg daily. - Labs with follow-up in July 2025 as scheduled.

## 2024-01-24 ENCOUNTER — Encounter: Payer: Self-pay | Admitting: Hematology

## 2024-01-28 ENCOUNTER — Telehealth: Payer: Self-pay | Admitting: Adult Health

## 2024-01-28 DIAGNOSIS — H35351 Cystoid macular degeneration, right eye: Secondary | ICD-10-CM | POA: Diagnosis not present

## 2024-01-28 DIAGNOSIS — G7000 Myasthenia gravis without (acute) exacerbation: Secondary | ICD-10-CM | POA: Diagnosis not present

## 2024-01-28 DIAGNOSIS — Z79899 Other long term (current) drug therapy: Secondary | ICD-10-CM | POA: Diagnosis not present

## 2024-01-28 DIAGNOSIS — I6350 Cerebral infarction due to unspecified occlusion or stenosis of unspecified cerebral artery: Secondary | ICD-10-CM | POA: Diagnosis not present

## 2024-01-28 DIAGNOSIS — I6381 Other cerebral infarction due to occlusion or stenosis of small artery: Secondary | ICD-10-CM | POA: Diagnosis not present

## 2024-01-28 DIAGNOSIS — G709 Myoneural disorder, unspecified: Secondary | ICD-10-CM | POA: Diagnosis not present

## 2024-01-28 DIAGNOSIS — C73 Malignant neoplasm of thyroid gland: Secondary | ICD-10-CM | POA: Diagnosis not present

## 2024-01-28 DIAGNOSIS — D8989 Other specified disorders involving the immune mechanism, not elsewhere classified: Secondary | ICD-10-CM | POA: Diagnosis not present

## 2024-01-28 DIAGNOSIS — H469 Unspecified optic neuritis: Secondary | ICD-10-CM | POA: Diagnosis not present

## 2024-01-28 DIAGNOSIS — H3500 Unspecified background retinopathy: Secondary | ICD-10-CM | POA: Diagnosis not present

## 2024-01-28 DIAGNOSIS — J984 Other disorders of lung: Secondary | ICD-10-CM | POA: Diagnosis not present

## 2024-01-28 DIAGNOSIS — E113591 Type 2 diabetes mellitus with proliferative diabetic retinopathy without macular edema, right eye: Secondary | ICD-10-CM | POA: Diagnosis not present

## 2024-01-28 NOTE — Telephone Encounter (Signed)
 Cancelled 6/11 appt with Camilo Cella because pt is seeing Dr. Janett Medin on 6/26. LVM and sent mychart msg informing her of cancellation.

## 2024-01-30 ENCOUNTER — Encounter: Payer: Self-pay | Admitting: Nurse Practitioner

## 2024-01-30 ENCOUNTER — Encounter: Payer: Self-pay | Admitting: Hematology

## 2024-02-01 DIAGNOSIS — Z170 Estrogen receptor positive status [ER+]: Secondary | ICD-10-CM | POA: Diagnosis not present

## 2024-02-01 DIAGNOSIS — Z79899 Other long term (current) drug therapy: Secondary | ICD-10-CM | POA: Diagnosis not present

## 2024-02-01 DIAGNOSIS — H3500 Unspecified background retinopathy: Secondary | ICD-10-CM | POA: Diagnosis not present

## 2024-02-01 DIAGNOSIS — G7000 Myasthenia gravis without (acute) exacerbation: Secondary | ICD-10-CM | POA: Diagnosis not present

## 2024-02-01 DIAGNOSIS — C50412 Malignant neoplasm of upper-outer quadrant of left female breast: Secondary | ICD-10-CM | POA: Diagnosis not present

## 2024-02-01 DIAGNOSIS — D8989 Other specified disorders involving the immune mechanism, not elsewhere classified: Secondary | ICD-10-CM | POA: Diagnosis not present

## 2024-02-01 DIAGNOSIS — Z5112 Encounter for antineoplastic immunotherapy: Secondary | ICD-10-CM | POA: Diagnosis not present

## 2024-02-02 ENCOUNTER — Ambulatory Visit: Payer: Medicare Other | Admitting: Adult Health

## 2024-02-03 ENCOUNTER — Institutional Professional Consult (permissible substitution): Admitting: Neurology

## 2024-02-04 ENCOUNTER — Other Ambulatory Visit: Payer: Self-pay | Admitting: Neurology

## 2024-02-15 DIAGNOSIS — Z79899 Other long term (current) drug therapy: Secondary | ICD-10-CM | POA: Diagnosis not present

## 2024-02-15 DIAGNOSIS — H3500 Unspecified background retinopathy: Secondary | ICD-10-CM | POA: Diagnosis not present

## 2024-02-15 DIAGNOSIS — G7000 Myasthenia gravis without (acute) exacerbation: Secondary | ICD-10-CM | POA: Diagnosis not present

## 2024-02-15 DIAGNOSIS — D8989 Other specified disorders involving the immune mechanism, not elsewhere classified: Secondary | ICD-10-CM | POA: Diagnosis not present

## 2024-02-17 ENCOUNTER — Telehealth: Payer: Self-pay | Admitting: Neurology

## 2024-02-17 ENCOUNTER — Encounter: Payer: Self-pay | Admitting: Neurology

## 2024-02-17 ENCOUNTER — Ambulatory Visit: Admitting: Neurology

## 2024-02-17 VITALS — BP 142/68 | HR 90 | Ht 63.0 in | Wt 212.0 lb

## 2024-02-17 DIAGNOSIS — G7000 Myasthenia gravis without (acute) exacerbation: Secondary | ICD-10-CM | POA: Diagnosis not present

## 2024-02-17 DIAGNOSIS — G43009 Migraine without aura, not intractable, without status migrainosus: Secondary | ICD-10-CM | POA: Diagnosis not present

## 2024-02-17 DIAGNOSIS — Z8673 Personal history of transient ischemic attack (TIA), and cerebral infarction without residual deficits: Secondary | ICD-10-CM | POA: Diagnosis not present

## 2024-02-17 DIAGNOSIS — I6350 Cerebral infarction due to unspecified occlusion or stenosis of unspecified cerebral artery: Secondary | ICD-10-CM | POA: Diagnosis not present

## 2024-02-17 DIAGNOSIS — R519 Headache, unspecified: Secondary | ICD-10-CM

## 2024-02-17 MED ORDER — CLOPIDOGREL BISULFATE 75 MG PO TABS: 75.0000 mg | ORAL_TABLET | Freq: Every day | ORAL | 1 refills | Status: DC

## 2024-02-17 MED ORDER — TOPIRAMATE 50 MG PO TABS: 50.0000 mg | ORAL_TABLET | Freq: Two times a day (BID) | ORAL | 3 refills | Status: DC

## 2024-02-17 NOTE — Patient Instructions (Addendum)
 I had a long discussion with patient  regarding her recent pontine stroke and chronic daily headaches and answered questions.  She will continue Plavix  75 mg daily for stroke prevention and maintain aggressive risk factor modification with strict control of hypertension with blood pressure goal below 130/90, lipids with LDL cholesterol goal below 70 mg percent and diabetes with hemoglobin A1c goal below 6.5%.  Continue to use her BiPAP for treatment for her sleep apnea.  She is also advised to continue follow-up for myasthenia with her neurologist at [facility] and stay on current dosages of CellCept  and Rituxan .  I recommend she discontinue Tylenol  as she is taking it daily and may be contributing to analgesic rebound headache.  Increase dose of Topamax  to 50 mg twice daily to help with her daily headaches.  I also advised her to do regular daily neck stretching exercises and increase participation in activities for stress relaxation.  She will return for follow-up in the future in 6 months with my nurse practitioner on call earlier if necessary

## 2024-02-17 NOTE — Telephone Encounter (Signed)
 Cld Pt regarding scripts with refills at [facility]. Pt stated she had called [facility] to cancel any refills and was told that can't be done and she will receive meds in after July 4. Pt stated she only has enough Plavix  [facility]ru [facility] and [facility] cannot fill the script as [facility] just refilled. Pt stated she has enough topiramate  but not Plavix . Informed Pt will reach out to [facility] and will call her back. Pt voiced understanding and thanks for the help.

## 2024-02-17 NOTE — Progress Notes (Signed)
 [facility] [address] [place]. [place] [address] [phone]       OFFICE FOLLOW UP VISIT NOTE  [name] Date of Birth:  Jul 03, 1951 Medical Record Number:  [id number]   Referring MD: Prentice Compton  Reason for Referral: Numbness and headache  HPI: Initial visit 10/29/2020 :[name] is a 73 year old African-American lady seen today for office consultation visit.  History is obtained from the [name], review of electronic medical records as well as care everywhere and I personally reviewed pertinent imaging films in PACS.  She has past medical history of hypertension, hyperlipidemia, obesity, asthma, hypothyroidism, seronegative ocular myasthenia, sleep apnea, thyroid  carcinoma, diabetes.  [name] states that she has had new onset of numbness involving the right face for the last few months.  This is intermittent and can last for several minutes to hours and can occur at variable frequency couple of times a week to once every couple of weeks.  She is also noticed for the last several weeks headaches which is sharp shooting severe lasting few seconds involving mostly the frontal and occasionally occipital regions.  These also occur to 3 times a day.  She is been taking some Tylenol  which seems to help.  There is occasional sweating with these headaches but she denies any tearing of her eyes or redness accompanying the headaches.  [name] has tried taking gabapentin  for the numbness which has not helped.  She is presently on 300 mg 3 times daily which was recently increased.  She has actually history of left thalamic lacunar infarct in April 2021 at that time she did have some right face paresthesias and numbness which actually resolved several months after till they recurred again recently.  [name] also has longstanding history of ocular myasthenia which was seronegative diagnosed in July 2014.  She has been following up with Dr. Juel neurologist at [facility] and is  presently on CellCept  1500 mg twice daily and prednisone  10 mg daily.  She still has some mild ptosis and intermittent diplopia but symptoms have been stable for quite some time.  She does have an appointment to see Dr. Juel in June.  She denies any recurrent stroke or TIA symptoms.  She is on aspirin  which is tolerating well without bruising or bleeding.  She states her sugars are now better controlled and she is current only on insulin  pump and uses her insulin  sensor and has an upcoming appointment at the end of the month with endocrinologist Dr. Balin and will have lipid profile and A1c checked at that visit.  She states her blood pressure is usually runs pretty good though today it is elevated at 170/76 as she was late for the appointment and she could not find her office.  She denies any loss of vision with her recent new headaches or muscle aches or pains or jaw claudication or scalp tenderness. Update 02/18/2021: She returns for follow-up after last visit 3 months ago.  [name] states she has noticed some improvement in her headaches and numbness when she takes Topamax  but she is not taking it on a scheduled basis and takes it only as needed.  Symptoms still persist but not as bothersome.  She has a new complaint of pain and swelling in the left eye and she has seen an ophthalmologist with diagnosed her with a stye.  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  LDL cholesterol on  01/01/2021 was 133 and Dr. Ladona increase the dose of Crestor  which she now takes at 40 mg daily.  She also had carotid ultrasound on 01/07/2021 which showed 70% right ICA and 50 to 69% left ICA stenosis.  She also is complaining of right hand pain and is wearing a carpal tunnel splint.  She has been advised surgery but she is not happy with the surgeon and is thinking about a second opinion. Update 08/06/2021 : [name] returns for follow-up  after last visit 6 months ago.  She is accompanied by her daughter.  [name] states her headaches are doing well and still occur intermittently off and on once every 2 to 3 weeks.  She is on Topamax  50 mg twice daily but feels that the morning dose of makes her sleepy and wonders if she needs to reduce the dose.  She is not had much episodes of numbness and occasionally has numbness involving her nose and right face which is not bothersome.  She does follow-up with her neurologist at [facility] for myasthenia and getting treated with IVIG infusion symptoms appear to be stable.  She has had no recurrent stroke or TIA symptoms.  She did have follow-up carotid ultrasound done last week in Dr. Godfrey office which shows stable appearance of greater than 70% right ICA and 50 to 69% left ICA stenosis.  She has no new symptoms.  She wants to drive.  Update 02/17/2024 : She returns for follow-up after last visit with me 2 and half years ago.  She has recently hospitalized for another stroke in March 2025.  She presented on 10/29/2023 for sudden onset of right facial numbness upon awakening that morning.  She also complained of blurred vision and some double vision.  She had had a headache for a week prior and some vertigo which had resolved.  Code stroke was called and CT scan showed no acute abnormality.  MRI scan showed 5 mm acute infarct in the dorsal pons on the right.  MRA suspected flow-limiting stenosis of the left proximal ICA.  Carotid ultrasound showed 40 to 59% right ICA and patent left carotid stent without significant stenosis.  LDL cholesterol 41 mg percent.  Hemoglobin A1c of 9.2.  [name] had been on aspirin  prior to admission and Plavix  was added for 3 weeks with plans to switch to Plavix  alone after that.  [name] states she has done well since discharge.  She still notices some occasional dragging of her right leg and has trouble picking it up.  She is walking with a walker.  She has had no falls or injury.  She  has residual numbness in the right hand from her old stroke.  She remains on Plavix  which she is tolerating well but does bruise easily.  She was on CPAP for sleep apnea but she went to [facility] and has now since been switched to BiPAP.  She continues to follow-up at [facility] for myasthenia and is currently on rituximab  and CellCept  and doing well.  She was diagnosed with breast cancer and has undergone chemotherapy and radiation currently in remission.  She still continues to have daily headaches which are moderate in intensity.  Very occasionally they can be sharp.  She takes a couple of tablets of Tylenol  on daily basis.  She remains on Topamax  50 mg at night which she is tolerating well without side effects.  She does follow-up for myasthenia at [facility] and was on IVIG which was discontinued due to concerns about increasing stroke  risk.  She is currently on CellCept  and Rituxan . ROS:   14 system review of systems is positive for numbness, headache, left eye pain and eyelid swelling drooping of eyelids, diplopia, wrist pain and all other systems negative PMH:  Past Medical History:  Diagnosis Date   Arthritis    all over my body (03/13/2013)   Asthma    Asymptomatic carotid artery stenosis, bilateral 10/08/2018   Breast cancer (HCC)    GERD (gastroesophageal reflux disease)    H/O hiatal hernia    Headache    pt states she has had headaches for about 6 months off and on   Heart murmur    pt had echocardiogram on 09/17/21   Hypercholesterolemia 10/08/2018   Hypertension    Hypothyroidism    Myasthenia gravis (HCC)    in my eyes; diagnsosed > 7 yr ago (03/13/2013)   Sleep apnea    on CPAP   Stroke (HCC) 2021   Thyroid  carcinoma (HCC)    Type II diabetes mellitus (HCC)     Social History:  Social History   Socioeconomic History   Marital status: Single    Spouse name: Not on file   Number of children: 0   Years of education: college   Highest education level: Not on file  Occupational  History   Occupation: retired  Tobacco Use   Smoking status: Never   Smokeless tobacco: Never  Vaping Use   Vaping status: Never Used  Substance and Sexual Activity   Alcohol use: No    Alcohol/week: 0.0 standard drinks of alcohol   Drug use: No   Sexual activity: Never  Other Topics Concern   Not on file  Social History Narrative   Lives alone   Right handed   Drinks no caffeine   Social Drivers of Corporate investment banker Strain: Low Risk  (08/12/2022)   Overall Financial Resource Strain (CARDIA)    Difficulty of Paying Living Expenses: Not very hard  Food Insecurity: No Food Insecurity (10/29/2023)   Hunger Vital Sign    Worried About Running Out of Food in the Last Year: Never true    Ran Out of Food in the Last Year: Never true  Transportation Needs: No Transportation Needs (10/29/2023)   PRAPARE - Administrator, Civil Service (Medical): No    Lack of Transportation (Non-Medical): No  Physical Activity: Not on file  Stress: Not on file  Social Connections: Moderately Isolated (10/29/2023)   Social Connection and Isolation Panel    Frequency of Communication with Friends and Family: More than three times a week    Frequency of Social Gatherings with Friends and Family: More than three times a week    Attends Religious Services: Never    Database administrator or Organizations: No    Attends Engineer, structural: 1 to 4 times per year    Marital Status: Never married  Intimate Partner Violence: Not At Risk (10/29/2023)   Humiliation, Afraid, Rape, and Kick questionnaire    Fear of Current or Ex-Partner: No    Emotionally Abused: No    Physically Abused: No    Sexually Abused: No    Medications:   Current Outpatient Medications on File Prior to Visit  Medication Sig Dispense Refill   anastrozole  (ARIMIDEX ) 1 MG tablet Take 1 tablet (1 mg total) by mouth daily. 90 tablet 0   clopidogrel  (PLAVIX ) 75 MG tablet Take 1 tablet (75 mg total) by mouth  daily. 30 tablet 1   cycloSPORINE  (RESTASIS ) 0.05 % ophthalmic emulsion Place 1 drop into both eyes 2 (two) times daily.     empagliflozin  (JARDIANCE ) 25 MG TABS tablet Take 25 mg by mouth daily.     furosemide  (LASIX ) 40 MG tablet Take 40 mg by mouth daily.     levothyroxine  (SYNTHROID ) 100 MCG tablet Take 1 tablet (100 mcg total) by mouth daily at 6 (six) AM. ([name] taking differently: Take 100 mcg by mouth daily at 6 (six) AM. Taking 175 mcg daily)     meclizine  (ANTIVERT ) 25 MG tablet Take 25 mg by mouth daily.     montelukast  (SINGULAIR ) 10 MG tablet Take 1 tablet (10 mg total) by mouth every morning. 30 tablet 0   mycophenolate  (CELLCEPT ) 500 MG tablet Take 1,000 mg by mouth 2 (two) times daily. For myasthenia gravis 1000 mg in AM, 1500 mg in PM     pantoprazole  (PROTONIX ) 40 MG tablet Take 1 tablet (40 mg total) by mouth 2 (two) times daily. 60 tablet 0   predniSONE  (DELTASONE ) 2.5 MG tablet Take 7.5 mg by mouth every morning. For myasthenia gravis ([name] taking differently: Take 5 mg by mouth every morning. Take 4 tablets once daily  For myasthenia gravis)     Vitamin D , Ergocalciferol , (DRISDOL ) 50000 UNITS CAPS capsule Take 50,000 Units by mouth every Monday.      acetaminophen  (TYLENOL ) 325 MG tablet Take 1-2 tablets (325-650 mg total) by mouth every 4 (four) hours as needed for mild pain (pain score 1-3). ([name] not taking: Reported on 02/17/2024)     diphenoxylate -atropine  (LOMOTIL ) 2.5-0.025 MG tablet Take 1-2 tablets by mouth 4 (four) times daily as needed for diarrhea or loose stools. 30 tablet 2   docusate sodium  (COLACE) 100 MG capsule Take 1 capsule (100 mg total) by mouth daily. 30 capsule 0   ELDERBERRY PO Take 1 tablet by mouth daily.     Evolocumab with Infusor (REPATHA PUSHTRONEX SYSTEM) 420 MG/3.5ML SOCT Inject 420 mg into the skin every 30 (thirty) days.     Exenatide ER (BYDUREON BCISE) 2 MG/0.85ML AUIJ Inject 2 mg into the skin every Monday. ([name] not taking:  Reported on 12/02/2023)     ezetimibe  (ZETIA ) 10 MG tablet TAKE 1 TABLET BY MOUTH EVERY DAY 90 tablet 1   Immune Globulin, Human, 40 GM/400ML SOLN Inject into the vein every 6 (six) weeks. Infusions are administered at home by home health nurse - last infusion 09/08/21 and 09/09/21; next infusion due 10/19/21 and 10/20/21 ([name] not taking: Reported on 02/17/2024)     insulin  lispro (HUMALOG) 100 UNIT/ML injection Inject into the skin See admin instructions. Inject subcutaneously 3 (three) times daily with meals With meals through the Omnipod Dash Pump every 72 hours as directed     lidocaine -prilocaine  (EMLA ) cream Apply to affected area once 30 g 3   loperamide (IMODIUM A-D) 2 MG tablet Take 2 mg by mouth 4 (four) times daily as needed for diarrhea or loose stools.     loratadine  (CLARITIN ) 10 MG tablet Take 1 tablet (10 mg total) by mouth at bedtime. 30 tablet 0   ondansetron  (ZOFRAN ) 8 MG tablet TAKE 1 TABLET BY MOUTH EVERY 8  HOURS AS NEEDED FOR NAUSEA AND  VOMITING 60 tablet 1   pregabalin  (LYRICA ) 25 MG capsule Take 1 capsule (25 mg total) by mouth 2 (two) times daily. ([name] taking differently: Take 10 mg by mouth 2 (two) times daily.) 180 capsule 0  PRESCRIPTION MEDICATION Pt uses a cpap nightly     PROAIR  HFA 108 (90 BASE) MCG/ACT inhaler Inhale 2 puffs into the lungs every 6 (six) hours as needed for wheezing or shortness of breath.      prochlorperazine  (COMPAZINE ) 10 MG tablet Take 1 tablet (10 mg total) by mouth every 6 (six) hours as needed for nausea or vomiting. 30 tablet 1   psyllium (HYDROCIL/METAMUCIL) 95 % PACK Take 1 packet by mouth daily. 30 each 0   riTUXimab  (RITUXAN ) 500 MG/50ML injection Inject into the vein every 6 (six) months.     rosuvastatin  (CRESTOR ) 40 MG tablet Take 1 tablet (40 mg total) by mouth daily. 90 tablet 3   tamoxifen  (NOLVADEX ) 20 MG tablet Take 1 tablet (20 mg total) by mouth daily. ([name] not taking: Reported on 02/17/2024) 90 tablet 1   topiramate   (TOPAMAX ) 50 MG tablet TAKE 1 TABLET BY MOUTH AT  BEDTIME 90 tablet 3   vitamin B-12 (CYANOCOBALAMIN ) 1000 MCG tablet Take 1,000 mcg by mouth once a week.     No current facility-administered medications on file prior to visit.    Allergies:   Allergies  Allergen Reactions   Contrast Media [Iodinated Contrast Media] Anaphylaxis    01/10/15 and 09/18/2021 --PT GIVEN 13 HR PRE MEDS FOR CT with contrast TOLERATED IV CONTRAST W/O ANY REACTION   Fluorescein Shortness Of Breath and Other (See Comments)    (Dye)   Iodine  Anaphylaxis    Contrast dye - iodine    Magnesium -Containing Compounds Other (See Comments)    H/O myasthenia gravis- caution replacing IV    Molds & Smuts Shortness Of Breath and Other (See Comments)    Congestion and wheezing, also   Shellfish-Derived Products Anaphylaxis, Shortness Of Breath, Swelling and Other (See Comments)    Welts, also   Pholcodine Hives   Azithromycin Nausea Only   Codeine Hives and Nausea Only   Metformin Hcl Er Nausea Only   Oxycodone  Nausea Only    Nausea 08/2022 admission.   Tramadol  Hcl Nausea Only   Other Rash and Other (See Comments)    Coban causes welts, also    Physical Exam General: Mildly obese middle-aged African-American lady seated, in no evident distress Head: head normocephalic and atraumatic.   Neck: supple with no carotid or supraclavicular bruits Cardiovascular: regular rate and rhythm, no murmurs Musculoskeletal: no deformity.  Wearing a right wrist splint. Skin:  no rash/petichiae left eye swollen due to stye Vascular:  Normal pulses all extremities  Neurologic Exam Mental Status: Awake and fully alert. Oriented to place and time. Recent and remote memory intact. Attention span, concentration and fund of knowledge appropriate. Mood and affect appropriate.  Cranial Nerves: Fundoscopic exam not done.  Pupils equal, briskly reactive to light. Extraocular movements full without nystagmus but complains of subjective diplopia  on upgaze..  Bilateral resting ptosis left greater than right which increases with sustained upgaze.  No subjective diplopia or ophthalmoplegia..  Vision acuity is diminished in the right eye.  Visual fields full to confrontation in the left eye but diminished in the right.. Hearing intact. Facial sensation intact. Face, tongue, palate moves normally and symmetrically.  Motor: Normal bulk and tone. Normal strength in all tested extremity muscles.  Mild weakness of right grip and intrinsic hand muscles.  Orbits left over right upper extremity.  Right shoulder weakness 4/5.   Sensory.:  Subjective diminished right hemibody touch , pinprick , position and vibratory sensation.  With splitting of the midline to  touch and forehead to vibration. Coordination: Rapid alternating movements normal in all extremities. Finger-to-nose and heel-to-shin performed accurately bilaterally. Gait and Station: Arises from chair without difficulty.  Uses a walker.  Stance is slightly broad-based. Gait is cautious but with reasonable balance with walker.. Able to heel, toe and tandem walk with moderate t difficulty.  Reflexes: 1+ and symmetric. Toes downgoing.      ASSESSMENT: 73year-old African-American lady with chronic daily headache with analgesic rebound.  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  Vascular risk factors of diabetes, hypertension, hyperlipidemia, obesity and sleep apnea.  She also has seronegative ocular myasthenia which appears stable and well controlled on current medication regimen of CellCept  and Rituxan .     PLAN: I had a long discussion with [name]  regarding her recent pontine stroke and chronic daily headaches and answered questions.  She will continue Plavix  75 mg daily for stroke prevention and maintain aggressive risk factor modification with strict control of hypertension with blood pressure goal  below 130/90, lipids with LDL cholesterol goal below 70 mg percent and diabetes with hemoglobin A1c goal below 6.5%.  Continue to use her BiPAP for treatment for her sleep apnea.  She is also advised to continue follow-up for myasthenia with her neurologist at [facility] and stay on current dosages of CellCept  and Rituxan .  I recommend she discontinue Tylenol  as she is taking it daily and may be contributing to analgesic rebound headache.  Increase dose of Topamax  to 50 mg twice daily to help with her daily headaches.  I also advised her to do regular daily neck stretching exercises and increase participation in activities for stress relaxation.  She will return for follow-up in the future in 6 months with my nurse practitioner on call earlier if necessary. Greater than 50% time during this 30 minute visit was spent on counseling and coordination of care about her facial paresthesias and new headaches as well as remote stroke and ocular myasthenia discussion and answering questions. Eather Popp, MD Note: This document was prepared with digital dictation and possible smart phrase technology. Any transcriptional errors that result from this process are unintentional.

## 2024-02-17 NOTE — Telephone Encounter (Signed)
 Spoke w/[facility] pharmacy. They are able to fill the clopidogrel  today and Pt will be notified. Also, topiramate  is on file as too soon to refill at this time.

## 2024-02-17 NOTE — Telephone Encounter (Signed)
 Spoke w/Cindy and [employer] at [facility]. They had filled the script for clopidogrel  today as it had originally been sent to them but Dr. Rosemarie D/C'd and then sent to [facility] Confirmed D/C time with Dorthea who stated she will put a stop order on the shipment and in about an hour [facility] should be able to fill med and Pt not have copay. Spoke w/Edward in regards to the topiramate  script - he said that it was on file but will D/C. He also verified that the clopidogrel  was D/C'd.  Cld [facility] pharmacy - spoke w/Shawn and gave him the information from [facility]. Elouise stated they will not be able to bill insurance until after [facility] has recalled the script and asked if we can call back in an hour to make sure the script goes through.  Spoke w/Pt to make her aware the scripts were cancelled at [facility] and [facility] will be able to fill both scripts for her once the cancellation goes through to her insurance company which should be soon. Pt voiced understanding and thankful for the call back.

## 2024-02-17 NOTE — Telephone Encounter (Signed)
 Pt called in regards to medication . Pt explained tat Medication was to be sent to [facility] , however Pt states that [facility] has medication and is already sending medication thru mail order the patient states that she never told anyone to send medication to [facility].   Informed pt that on out end medication is at [facility] . PT is aware however [facility] decline pt medication due to [facility] already fill medication and its being mailed out. Pt would like call back to see what can be done to help her .DO. topiramate  (TOPAMAX ) 50 MG tablet clopidogrel  (PLAVIX ) 75 MG tablet

## 2024-02-20 ENCOUNTER — Other Ambulatory Visit: Payer: Self-pay

## 2024-02-24 ENCOUNTER — Telehealth: Payer: Self-pay | Admitting: Hematology

## 2024-02-24 NOTE — Telephone Encounter (Signed)
 Rescheduled appointments per patients request via incoming call. Talked with the patient and she is aware of the changes made to her upcoming appointments.

## 2024-02-28 ENCOUNTER — Other Ambulatory Visit: Attending: Hematology

## 2024-02-28 ENCOUNTER — Inpatient Hospital Stay: Admitting: Hematology

## 2024-03-02 ENCOUNTER — Encounter: Payer: Self-pay | Admitting: Hematology

## 2024-03-02 NOTE — Progress Notes (Signed)
 Returned call from voicemail left while I was on PAL.  Pt has concern with still receiving bills for physician copay and has copay assistance through the Assistance Fund. Reviewed notes and previous email forwarded to [facility] and received response back from [place]. Provided information to patient as well as [facility] number at [phone] to follow up regarding this concern. She verbalized understanding.  Pt states she saw on MyChart she was approved for some type of assistance but wasn't sure what it was. Advised patient to log in to mychart again to receive specifics. She verbalized understanding.  She was appreciative and has my card for any additional financial questions or concerns.

## 2024-03-08 NOTE — Assessment & Plan Note (Signed)
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 -she completed adjuvant chemo with weekly abraxane  X12 in 01/2023. Due to her neuropathy, chemo dose reduced.  -She completed adjuvant radiation in August 2024 -Her bone density scan in August 2024 showed multiple sites of osteopenia with T-score -2.3, high risk of hip fracture is 3% in the next 10 years. -I discussed the role of adjuvant antiestrogen therapy, given her high risk of hip fracture from osteopenia, I recommended tamoxifen  for 5 years, giving weak ER positivity. She started in 04/2023. Tamoxifen  was changed to exemestane  in 11/2023 after her stroke.  She had significant arthralgia from exemestane , and it was changed to anastrozole  in May 2025.

## 2024-03-09 ENCOUNTER — Telehealth: Payer: Self-pay

## 2024-03-09 ENCOUNTER — Other Ambulatory Visit: Payer: Self-pay | Admitting: *Deleted

## 2024-03-09 ENCOUNTER — Other Ambulatory Visit: Payer: Self-pay

## 2024-03-09 ENCOUNTER — Inpatient Hospital Stay (HOSPITAL_BASED_OUTPATIENT_CLINIC_OR_DEPARTMENT_OTHER): Admitting: Hematology

## 2024-03-09 ENCOUNTER — Inpatient Hospital Stay: Attending: Hematology

## 2024-03-09 ENCOUNTER — Other Ambulatory Visit: Payer: Self-pay | Admitting: Hematology

## 2024-03-09 VITALS — BP 116/56 | HR 101 | Temp 98.2°F | Resp 18 | Ht 63.0 in | Wt 209.3 lb

## 2024-03-09 DIAGNOSIS — T451X5D Adverse effect of antineoplastic and immunosuppressive drugs, subsequent encounter: Secondary | ICD-10-CM | POA: Diagnosis not present

## 2024-03-09 DIAGNOSIS — Z170 Estrogen receptor positive status [ER+]: Secondary | ICD-10-CM | POA: Insufficient documentation

## 2024-03-09 DIAGNOSIS — Z95828 Presence of other vascular implants and grafts: Secondary | ICD-10-CM

## 2024-03-09 DIAGNOSIS — Z1722 Progesterone receptor negative status: Secondary | ICD-10-CM | POA: Diagnosis not present

## 2024-03-09 DIAGNOSIS — D649 Anemia, unspecified: Secondary | ICD-10-CM

## 2024-03-09 DIAGNOSIS — M8589 Other specified disorders of bone density and structure, multiple sites: Secondary | ICD-10-CM | POA: Insufficient documentation

## 2024-03-09 DIAGNOSIS — Z79811 Long term (current) use of aromatase inhibitors: Secondary | ICD-10-CM | POA: Diagnosis not present

## 2024-03-09 DIAGNOSIS — Z7902 Long term (current) use of antithrombotics/antiplatelets: Secondary | ICD-10-CM | POA: Diagnosis not present

## 2024-03-09 DIAGNOSIS — Z8673 Personal history of transient ischemic attack (TIA), and cerebral infarction without residual deficits: Secondary | ICD-10-CM | POA: Insufficient documentation

## 2024-03-09 DIAGNOSIS — Z801 Family history of malignant neoplasm of trachea, bronchus and lung: Secondary | ICD-10-CM | POA: Insufficient documentation

## 2024-03-09 DIAGNOSIS — Z794 Long term (current) use of insulin: Secondary | ICD-10-CM | POA: Diagnosis not present

## 2024-03-09 DIAGNOSIS — G620 Drug-induced polyneuropathy: Secondary | ICD-10-CM | POA: Diagnosis not present

## 2024-03-09 DIAGNOSIS — E1122 Type 2 diabetes mellitus with diabetic chronic kidney disease: Secondary | ICD-10-CM | POA: Insufficient documentation

## 2024-03-09 DIAGNOSIS — Z79899 Other long term (current) drug therapy: Secondary | ICD-10-CM | POA: Diagnosis not present

## 2024-03-09 DIAGNOSIS — E2839 Other primary ovarian failure: Secondary | ICD-10-CM

## 2024-03-09 DIAGNOSIS — Z90710 Acquired absence of both cervix and uterus: Secondary | ICD-10-CM | POA: Diagnosis not present

## 2024-03-09 DIAGNOSIS — Z1732 Human epidermal growth factor receptor 2 negative status: Secondary | ICD-10-CM | POA: Diagnosis not present

## 2024-03-09 DIAGNOSIS — C50412 Malignant neoplasm of upper-outer quadrant of left female breast: Secondary | ICD-10-CM

## 2024-03-09 DIAGNOSIS — Z7982 Long term (current) use of aspirin: Secondary | ICD-10-CM | POA: Diagnosis not present

## 2024-03-09 DIAGNOSIS — Z923 Personal history of irradiation: Secondary | ICD-10-CM | POA: Insufficient documentation

## 2024-03-09 DIAGNOSIS — E7800 Pure hypercholesterolemia, unspecified: Secondary | ICD-10-CM | POA: Diagnosis not present

## 2024-03-09 LAB — CBC WITH DIFFERENTIAL (CANCER CENTER ONLY)
Abs Immature Granulocytes: 0.05 K/uL (ref 0.00–0.07)
Basophils Absolute: 0 K/uL (ref 0.0–0.1)
Basophils Relative: 1 %
Eosinophils Absolute: 0.2 K/uL (ref 0.0–0.5)
Eosinophils Relative: 3 %
HCT: 29.6 % — ABNORMAL LOW (ref 36.0–46.0)
Hemoglobin: 9.8 g/dL — ABNORMAL LOW (ref 12.0–15.0)
Immature Granulocytes: 1 %
Lymphocytes Relative: 22 %
Lymphs Abs: 1.4 K/uL (ref 0.7–4.0)
MCH: 29.6 pg (ref 26.0–34.0)
MCHC: 33.1 g/dL (ref 30.0–36.0)
MCV: 89.4 fL (ref 80.0–100.0)
Monocytes Absolute: 0.8 K/uL (ref 0.1–1.0)
Monocytes Relative: 12 %
Neutro Abs: 3.7 K/uL (ref 1.7–7.7)
Neutrophils Relative %: 61 %
Platelet Count: 253 K/uL (ref 150–400)
RBC: 3.31 MIL/uL — ABNORMAL LOW (ref 3.87–5.11)
RDW: 12.9 % (ref 11.5–15.5)
WBC Count: 6.2 K/uL (ref 4.0–10.5)
nRBC: 0 % (ref 0.0–0.2)

## 2024-03-09 LAB — CMP (CANCER CENTER ONLY)
ALT: 14 U/L (ref 0–44)
AST: 14 U/L — ABNORMAL LOW (ref 15–41)
Albumin: 3.6 g/dL (ref 3.5–5.0)
Alkaline Phosphatase: 108 U/L (ref 38–126)
Anion gap: 8 (ref 5–15)
BUN: 14 mg/dL (ref 8–23)
CO2: 21 mmol/L — ABNORMAL LOW (ref 22–32)
Calcium: 8.7 mg/dL — ABNORMAL LOW (ref 8.9–10.3)
Chloride: 113 mmol/L — ABNORMAL HIGH (ref 98–111)
Creatinine: 0.87 mg/dL (ref 0.44–1.00)
GFR, Estimated: >60 mL/min (ref >60)
Glucose, Bld: 186 mg/dL — ABNORMAL HIGH (ref 70–99)
Potassium: 3.3 mmol/L — ABNORMAL LOW (ref 3.5–5.1)
Sodium: 142 mmol/L (ref 135–145)
Total Bilirubin: 0.3 mg/dL (ref 0.0–1.2)
Total Protein: 6.6 g/dL (ref 6.5–8.1)

## 2024-03-09 LAB — IRON AND IRON BINDING CAPACITY (CC-WL,HP ONLY)
Iron: 68 ug/dL (ref 28–170)
Saturation Ratios: 18 % (ref 10.4–31.8)
TIBC: 370 ug/dL (ref 250–450)
UIBC: 302 ug/dL (ref 148–442)

## 2024-03-09 LAB — RETIC PANEL
Immature Retic Fract: 32.4 % — ABNORMAL HIGH (ref 2.3–15.9)
RBC.: 3.29 MIL/uL — ABNORMAL LOW (ref 3.87–5.11)
Retic Count, Absolute: 44.1 K/uL (ref 19.0–186.0)
Retic Ct Pct: 1.3 % (ref 0.4–3.1)
Reticulocyte Hemoglobin: 33.8 pg (ref >27.9)

## 2024-03-09 LAB — VITAMIN B12: Vitamin B-12: 222 pg/mL (ref 180–914)

## 2024-03-09 LAB — FERRITIN: Ferritin: 40 ng/mL (ref 11–307)

## 2024-03-09 MED ORDER — ANASTROZOLE 1 MG PO TABS: 1.0000 mg | ORAL_TABLET | Freq: Every day | ORAL | 1 refills | Status: AC

## 2024-03-09 MED ORDER — HEPARIN SOD (PORK) LOCK FLUSH 100 UNIT/ML IV SOLN
500.0000 [IU] | Freq: Once | INTRAVENOUS | Status: AC
Start: 2024-03-09 — End: 2024-03-09
  Administered 2024-03-09: 500 [IU]

## 2024-03-09 MED ORDER — SODIUM CHLORIDE 0.9% FLUSH
10.0000 mL | Freq: Once | INTRAVENOUS | Status: AC
  Administered 2024-03-09: 10 mL

## 2024-03-09 NOTE — Progress Notes (Signed)
 [facility]   [phone] [phone]   Clinic Follow up Note   Patient Care Team: Gerome Brunet, DO as PCP - General (Family Medicine) Rosemarie Eather RAMAN, MD as Consulting Physician (Neurology) Whitfield Raisin, NP as Nurse Practitioner (Neurology) Tyree Nanetta SAILOR, RN as Oncology Nurse Navigator Glean, Stephane BROCKS, RN (Inactive) as Oncology Nurse Navigator Vernetta Berg, MD as Consulting Physician (General Surgery) Lanny Callander, MD as Consulting Physician (Hematology) Dewey Rush, MD as Consulting Physician (Radiation Oncology) Randy Asa, MD as Consulting Physician (Rheumatology) Mammography, [facility] as Consulting Physician (Diagnostic Radiology) Burton, Lacie K, NP as Nurse Practitioner (Nurse Practitioner)  Date of Service:  03/09/2024  CHIEF COMPLAINT: f/u of breast cancer  CURRENT THERAPY:  Anastrozole   Oncology History   Malignant neoplasm of upper-outer quadrant of left breast in female, 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 -she completed adjuvant chemo with weekly abraxane  X12 in 01/2023. Due to her neuropathy, chemo dose reduced.  -She completed adjuvant radiation in August 2024 -Her bone density scan in August 2024 showed multiple sites of osteopenia with T-score -2.3, high risk of hip fracture is 3% in the next 10 years. -I discussed the role of adjuvant antiestrogen therapy, given her high risk of hip fracture from osteopenia, I recommended tamoxifen  for 5 years, giving weak ER positivity. She started in 04/2023. Tamoxifen  was changed to exemestane  in 11/2023 after her stroke.  She had significant arthralgia from exemestane , and it was changed to anastrozole  in May 2025.  Assessment & Plan Breast cancer, ER  weakly positive Currently on anastrozole  after switching from tamoxifen  and exemestane  due to joint pain. Tolerating anastrozole  well with no significant joint pain reported. The benefit of anastrozole  is present but not as strong as in cases with strong ER positivity. - Continue anastrozole  - Schedule mammogram in November - Refill anastrozole  for 90 days  Stroke with right-sided weakness Stroke occurred in March 2025, affecting the right side. Ongoing right-sided weakness and use of a walker. No longer in formal rehabilitation but continues strength training at home with assistance from her sister.  Neuropathy Experiencing pain in feet, possibly neuropathy. Scheduled to see the doctor who treated her during the hospital stay for the stroke. - Follow up with the doctor regarding foot pain and possible neuropathy  Mild anemia Mild anemia with hemoglobin at 9.8, slightly improved from March. Awaiting lab results to determine the cause, including iron and B12 levels. - Review lab results for iron and B12 levels when available   Plan - She is tolerating anastrozole  well so far, we will continue, I refilled it for her. - lab and follow-up in 6 months, sooner if needed   SUMMARY OF ONCOLOGIC HISTORY: Oncology History Overview Note   Cancer Staging  Malignant neoplasm of upper-outer quadrant of left breast in female, estrogen receptor positive (HCC) Staging form: Breast, AJCC 8th Edition - Clinical: Stage IB (cT1b, cN0, cM0, G3, ER+, PR-, HER2-) - Signed by Lanell Donald Stagger, PA-C on 08/10/2022 Stage prefix: Initial diagnosis Method of lymph node assessment: Clinical Histologic grading system: 3 grade system - Pathologic stage from 09/02/2022: Stage IB (pT1b, pN0, cM0, G2, ER-, PR-, HER2-) - Signed by Lanny Callander, MD on 09/23/2022 Stage prefix: Initial diagnosis Histologic grading system: 3 grade system Residual tumor (R): R0 - None  Malignant neoplasm of upper-outer quadrant of  left breast in female, estrogen receptor positive (HCC)  08/10/2022 Initial Diagnosis   Malignant neoplasm of upper-outer quadrant of left female breast (HCC)   08/10/2022 Cancer Staging   Staging form: Breast, AJCC 8th Edition - Clinical: Stage IB (cT1b, cN0, cM0, G3, ER+, PR-, HER2-) - Signed by Lanell Donald Stagger, PA-C on 08/10/2022 Stage prefix: Initial diagnosis Method of lymph node assessment: Clinical Histologic grading system: 3 grade system   09/02/2022 Cancer Staging   Staging form: Breast, AJCC 8th Edition - Pathologic stage from 09/02/2022: Stage IB (pT1b, pN0, cM0, G2, ER-, PR-, HER2-) - Signed by Lanny Callander, MD on 09/23/2022 Stage prefix: Initial diagnosis Histologic grading system: 3 grade system Residual tumor (R): R0 - None   10/21/2022 -  Chemotherapy   Patient is on Treatment Plan : BREAST PACLitaxel -Albumin  (Abraxane ) (100) D1,8,15 q28d     07/28/2023 Survivorship   SCP delivered by Lacie Burton, NP      Discussed the use of AI scribe software for clinical note transcription with the patient, who gave verbal consent to proceed.  History of Present Illness [name] is a 73 year old female with breast cancer who presents for follow-up. She is accompanied by her sister.  She is currently on anastrozole  after previous changes from tamoxifen  to exemestane  due to joint pain. She tolerates anastrozole  well without significant joint pain. She experiences foot pain associated with neuropathy and has an upcoming appointment to address this. There is no other bone pain. She denies significant hot flashes and maintains a normal appetite. She experiences some pain under her arm from surgery.  She had a stroke in March 2025 affecting her right side and uses a walker. She continues exercises at home with her sister's help. She uses a cream for her left breast following radiation. She awaits lab results to determine the cause of her anemia.     All other systems were  reviewed with the patient and are negative.  MEDICAL HISTORY:  Past Medical History:  Diagnosis Date   Arthritis    all over my body (03/13/2013)   Asthma    Asymptomatic carotid artery stenosis, bilateral 10/08/2018   Breast cancer (HCC)    GERD (gastroesophageal reflux disease)    H/O hiatal hernia    Headache    pt states she has had headaches for about 6 months off and on   Heart murmur    pt had echocardiogram on 09/17/21   Hypercholesterolemia 10/08/2018   Hypertension    Hypothyroidism    Myasthenia gravis (HCC)    in my eyes; diagnsosed > 7 yr ago (03/13/2013)   Sleep apnea    on CPAP   Stroke (HCC) 2021   Thyroid  carcinoma (HCC)    Type II diabetes mellitus (HCC)     SURGICAL HISTORY: Past Surgical History:  Procedure Laterality Date   ABDOMINAL HYSTERECTOMY     ANTERIOR CERVICAL DECOMP/DISCECTOMY FUSION     I've had severa ORs; always went in from the front (03/13/2013)   APPENDECTOMY     BREAST LUMPECTOMY WITH RADIOACTIVE SEED AND SENTINEL LYMPH NODE BIOPSY Left 09/02/2022   Procedure: LEFT BREAST LUMPECTOMY WITH RADIOACTIVE SEED AND SENTINEL LYMPH NODE BIOPSY;  Surgeon: Vernetta Berg, MD;  Location: [facility] OR;  Service: General;  Laterality: Left;   CARDIAC CATHETERIZATION     several (03/13/2013)   CARPAL TUNNEL RELEASE Right    CATARACT EXTRACTION W/ INTRAOCULAR LENS  IMPLANT, BILATERAL Bilateral  CHOLECYSTECTOMY     KNEE ARTHROSCOPY Left    SHOULDER ARTHROSCOPY W/ ROTATOR CUFF REPAIR Left    TONSILLECTOMY     TOTAL THYROIDECTOMY     TRANSCAROTID ARTERY REVASCULARIZATION  Left 09/24/2021   Procedure: LEFT TRANSCAROTID ARTERY REVASCULARIZATION;  Surgeon: Magda Debby SAILOR, MD;  Location: [facility] OR;  Service: Vascular;  Laterality: Left;   TRANSCAROTID ARTERY REVASCULARIZATION  Right 10/23/2021   Procedure: Right Transcarotid Artery Revascularization;  Surgeon: Magda Debby SAILOR, MD;  Location: [facility] OR;  Service: Vascular;  Laterality: Right;    ULTRASOUND GUIDANCE FOR VASCULAR ACCESS Right 09/24/2021   Procedure: ULTRASOUND GUIDANCE FOR VASCULAR ACCESS;  Surgeon: Magda Debby SAILOR, MD;  Location: [facility] OR;  Service: Vascular;  Laterality: Right;   ULTRASOUND GUIDANCE FOR VASCULAR ACCESS Right 10/23/2021   Procedure: ULTRASOUND GUIDANCE FOR VASCULAR ACCESS, LEFT FEMORAL VEIN;  Surgeon: Magda Debby SAILOR, MD;  Location: [facility] OR;  Service: Vascular;  Laterality: Right;    I have reviewed the social history and family history with the patient and they are unchanged from previous note.  ALLERGIES:  is allergic to contrast media [iodinated contrast media], fluorescein, iodine , magnesium -containing compounds, molds & smuts, shellfish-derived products, pholcodine, azithromycin, codeine, metformin hcl er, oxycodone , tramadol  hcl, and other.  MEDICATIONS:  Current Outpatient Medications  Medication Sig Dispense Refill   anastrozole  (ARIMIDEX ) 1 MG tablet Take 1 tablet (1 mg total) by mouth daily. 90 tablet 1   Biotin 5000 MCG CAPS Take 1 capsule by mouth daily.     clopidogrel  (PLAVIX ) 75 MG tablet Take 1 tablet (75 mg total) by mouth daily. 30 tablet 1   cycloSPORINE  (RESTASIS ) 0.05 % ophthalmic emulsion Place 1 drop into both eyes 2 (two) times daily.     diclofenac  Sodium (VOLTAREN ) 1 % GEL Apply 2 g topically as needed. Apply to legs and back as needed for pain     diphenhydrAMINE  (BENADRYL ) 50 MG capsule Take 50 mg by mouth every 6 (six) hours as needed (Take with infusion).     ELDERBERRY PO Take 1 tablet by mouth daily.     empagliflozin  (JARDIANCE ) 25 MG TABS tablet Take 25 mg by mouth daily.     Evolocumab with Infusor (REPATHA PUSHTRONEX SYSTEM) 420 MG/3.5ML SOCT Inject 420 mg into the skin every 30 (thirty) days. (Patient taking differently: Inject 140 mg into the skin every 14 (fourteen) days.)     ezetimibe  (ZETIA ) 10 MG tablet TAKE 1 TABLET BY MOUTH EVERY DAY 90 tablet 1   furosemide  (LASIX ) 40 MG tablet Take 40 mg by mouth daily.      gabapentin  (NEURONTIN ) 100 MG capsule Take 100 mg by mouth at bedtime.     insulin  lispro (HUMALOG) 100 UNIT/ML injection Inject into the skin See admin instructions. Inject subcutaneously 3 (three) times daily with meals With meals through the Omnipod Dash Pump every 72 hours as directed     levothyroxine  (SYNTHROID ) 100 MCG tablet Take 1 tablet (100 mcg total) by mouth daily at 6 (six) AM. (Patient taking differently: Take 100 mcg by mouth daily at 6 (six) AM. Taking 175 mcg daily)     meclizine  (ANTIVERT ) 25 MG tablet Take 25 mg by mouth daily.     montelukast  (SINGULAIR ) 10 MG tablet Take 1 tablet (10 mg total) by mouth every morning. 30 tablet 0   mycophenolate  (CELLCEPT ) 500 MG tablet Take 1,000 mg by mouth 2 (two) times daily. For myasthenia gravis 1000 mg in AM, 1500 mg in PM  olmesartan  (BENICAR ) 20 MG tablet Take 20 mg by mouth daily.     pantoprazole  (PROTONIX ) 40 MG tablet Take 1 tablet (40 mg total) by mouth 2 (two) times daily. 60 tablet 0   predniSONE  (DELTASONE ) 2.5 MG tablet Take 7.5 mg by mouth every morning. For myasthenia gravis (Patient taking differently: Take 5 mg by mouth every morning. Take 4 tablets once daily  For myasthenia gravis)     pregabalin  (LYRICA ) 25 MG capsule Take 1 capsule (25 mg total) by mouth 2 (two) times daily. 180 capsule 0   PRESCRIPTION MEDICATION Pt uses a cpap nightly     PROAIR  HFA 108 (90 BASE) MCG/ACT inhaler Inhale 2 puffs into the lungs every 6 (six) hours as needed for wheezing or shortness of breath.      riTUXimab  (RITUXAN ) 500 MG/50ML injection Inject into the vein every 6 (six) months.     scopolamine (TRANSDERM-SCOP) 1 MG/3DAYS Place 1 patch onto the skin every 3 (three) days as needed.     tirzepatide (MOUNJARO) 2.5 MG/0.5ML Pen Inject 2.5 mg into the skin once a week.     topiramate  (TOPAMAX ) 50 MG tablet Take 1 tablet (50 mg total) by mouth 2 (two) times daily. 90 tablet 3   TRESIBA FLEXTOUCH 100 UNIT/ML FlexTouch Pen Inject 15 Units  into the skin daily.     vitamin B-12 (CYANOCOBALAMIN ) 1000 MCG tablet Take 1,000 mcg by mouth once a week. (Patient taking differently: Take 1,000 mcg by mouth daily.)     Vitamin D , Ergocalciferol , (DRISDOL ) 50000 UNITS CAPS capsule Take 50,000 Units by mouth every Monday.      No current facility-administered medications for this visit.    PHYSICAL EXAMINATION: ECOG PERFORMANCE STATUS: 2 - Symptomatic, <50% confined to bed  Vitals:   03/09/24 0852  BP: (!) 116/56  Pulse: (!) 101  Resp: 18  Temp: 98.2 F (36.8 C)  SpO2: 99%   Wt Readings from Last 3 Encounters:  03/09/24 209 lb 4.8 oz (94.9 kg)  02/17/24 212 lb (96.2 kg)  12/02/23 199 lb 9.6 oz (90.5 kg)     GENERAL:alert, no distress and comfortable SKIN: skin color, texture, turgor are normal, no rashes or significant lesions EYES: normal, Conjunctiva are pink and non-injected, sclera clear NECK: supple, thyroid  normal size, non-tender, without nodularity LYMPH:  no palpable lymphadenopathy in the cervical, axillary  LUNGS: clear to auscultation and percussion with normal breathing effort HEART: regular rate & rhythm and no murmurs and no lower extremity edema ABDOMEN:abdomen soft, non-tender and normal bowel sounds Musculoskeletal:no cyanosis of digits and no clubbing  NEURO: alert & oriented x 3 with fluent speech, no focal motor/sensory deficits BREAST: Left breast dark and slightly swollen. Incision well-healed, no palpable breast mass or adenopathy. Physical Exam   LABORATORY DATA:  I have reviewed the data as listed    Latest Ref Rng & Units 03/09/2024    8:34 AM 12/02/2023    9:24 AM 11/15/2023    4:24 AM  CBC  WBC 4.0 - 10.5 K/uL 6.2  3.7  8.1   Hemoglobin 12.0 - 15.0 g/dL 9.8  9.3  8.7   Hematocrit 36.0 - 46.0 % 29.6  29.1  26.8   Platelets 150 - 400 K/uL 253  192  232         Latest Ref Rng & Units 03/09/2024    8:34 AM 12/02/2023    9:24 AM 11/15/2023    4:24 AM  CMP  Glucose 70 - 99 mg/dL  186  195   116   BUN 8 - 23 mg/dL 14  15  18    Creatinine 0.44 - 1.00 mg/dL 9.12  9.06  9.01   Sodium 135 - 145 mmol/L 142  140  142   Potassium 3.5 - 5.1 mmol/L 3.3  3.8  3.4   Chloride 98 - 111 mmol/L 113  107  115   CO2 22 - 32 mmol/L 21  25  23    Calcium  8.9 - 10.3 mg/dL 8.7  8.4  8.2   Total Protein 6.5 - 8.1 g/dL 6.6  7.8    Total Bilirubin 0.0 - 1.2 mg/dL 0.3  0.3    Alkaline Phos 38 - 126 U/L 108  85    AST 15 - 41 U/L 14  18    ALT 0 - 44 U/L 14  13        RADIOGRAPHIC STUDIES: I have personally reviewed the radiological images as listed and agreed with the findings in the report. No results found.    No orders of the defined types were placed in this encounter.  All questions were answered. The patient knows to call the clinic with any problems, questions or concerns. No barriers to learning was detected. The total time spent in the appointment was 25 minutes, including review of chart and various tests results, discussions about plan of care and coordination of care plan     Onita Mattock, MD 03/09/2024

## 2024-03-09 NOTE — Telephone Encounter (Signed)
 It has been denied as patient needs to try three of the following alternatives (Amitriptyline, Desipramine, Duloxetine, Nortriptyline and Venlafaxine) before moving to Qutenza as enough information was not provided that why Qutenza is being used. Denial Case ID: [id number]. Rep: Gordy BRAVO Ref: [id number].

## 2024-03-10 ENCOUNTER — Encounter: Payer: Self-pay | Admitting: Physical Medicine and Rehabilitation

## 2024-03-10 ENCOUNTER — Other Ambulatory Visit: Payer: Self-pay

## 2024-03-10 ENCOUNTER — Encounter: Attending: Physical Medicine and Rehabilitation | Admitting: Physical Medicine and Rehabilitation

## 2024-03-10 ENCOUNTER — Other Ambulatory Visit: Payer: Self-pay | Admitting: Genetic Counselor

## 2024-03-10 ENCOUNTER — Telehealth: Payer: Self-pay | Admitting: Physical Medicine and Rehabilitation

## 2024-03-10 VITALS — BP 147/84 | HR 93 | Ht 63.0 in | Wt 206.4 lb

## 2024-03-10 DIAGNOSIS — E1140 Type 2 diabetes mellitus with diabetic neuropathy, unspecified: Secondary | ICD-10-CM | POA: Diagnosis not present

## 2024-03-10 DIAGNOSIS — E66812 Obesity, class 2: Secondary | ICD-10-CM | POA: Diagnosis present

## 2024-03-10 DIAGNOSIS — E1149 Type 2 diabetes mellitus with other diabetic neurological complication: Secondary | ICD-10-CM | POA: Diagnosis not present

## 2024-03-10 DIAGNOSIS — G43801 Other migraine, not intractable, with status migrainosus: Secondary | ICD-10-CM | POA: Diagnosis not present

## 2024-03-10 DIAGNOSIS — Z170 Estrogen receptor positive status [ER+]: Secondary | ICD-10-CM

## 2024-03-10 DIAGNOSIS — Z794 Long term (current) use of insulin: Secondary | ICD-10-CM | POA: Diagnosis not present

## 2024-03-10 MED ORDER — JOURNAVX 50 MG PO TABS: 1.0000 | ORAL_TABLET | Freq: Every day | ORAL | 3 refills | Status: DC | PRN

## 2024-03-10 NOTE — Telephone Encounter (Signed)
 Pt called with questions about  a medication/ vitamin change dicussed at her visit on DOS 03/10/24, and would like to speak with you.

## 2024-03-10 NOTE — Progress Notes (Signed)
 Subjective:    Patient ID: [name], female    DOB: 05/18/1951, 73 y.o.   MRN: [id number]  HPI [name] is a 73 year old woman who presents regarding neuropathy  1) Neuropathy -failed gabapentin , tylenol  extra strength, pregabalin , amitriptyline, cymbalta, vaseline, lidocaine  patch, topamax  -has never tried Journavx  2) Headache: -every day -she takes day and night  Pain Inventory Average Pain 9 Pain Right Now 8 My pain is stabbing and tingling  In the last 24 hours, has pain interfered with the following? General activity 5 Relation with others 5 Enjoyment of life 5 What TIME of day is your pain at its worst? night Sleep (in general) Fair  Pain is worse with: bedtime Pain improves with: rest Relief from Meds: 0  use a walker how many minutes can you walk? Walk around the house all throughout the day ability to climb steps?  yes do you drive?  no Do you have any goals in this area?  yes  retired  weakness numbness tingling trouble walking confusion  Any changes since last visit?  no  Primary care Lonell Collet, DO   Neurologist Eather Popp, MD    Family History  Problem Relation Age of Onset   Coronary artery disease Sister        s/p coronary stenting   Stroke Sister 47   Diabetes Sister    Congestive Heart Failure Mother    Asthma Mother    Diabetes Mother    Congestive Heart Failure Father    Lung cancer Father    Asthma Father    Diabetes Father    Diabetes Brother    Social History   Socioeconomic History   Marital status: Single    Spouse name: Not on file   Number of children: 0   Years of education: college   Highest education level: Not on file  Occupational History   Occupation: retired  Tobacco Use   Smoking status: Never   Smokeless tobacco: Never  Vaping Use   Vaping status: Never Used  Substance and Sexual Activity   Alcohol use: No    Alcohol/week: 0.0 standard drinks of alcohol   Drug use: No   Sexual  activity: Never  Other Topics Concern   Not on file  Social History Narrative   Lives alone   Right handed   Drinks no caffeine   Social Drivers of Corporate investment banker Strain: Low Risk  (08/12/2022)   Overall Financial Resource Strain (CARDIA)    Difficulty of Paying Living Expenses: Not very hard  Food Insecurity: No Food Insecurity (03/09/2024)   Hunger Vital Sign    Worried About Running Out of Food in the Last Year: Never true    Ran Out of Food in the Last Year: Never true  Transportation Needs: No Transportation Needs (03/09/2024)   PRAPARE - Administrator, Civil Service (Medical): No    Lack of Transportation (Non-Medical): No  Physical Activity: Not on file  Stress: Not on file  Social Connections: Moderately Isolated (10/29/2023)   Social Connection and Isolation Panel    Frequency of Communication with Friends and Family: More than three times a week    Frequency of Social Gatherings with Friends and Family: More than three times a week    Attends Religious Services: Never    Database administrator or Organizations: No    Attends Banker Meetings: 1 to 4 times per year  Marital Status: Never married   Past Surgical History:  Procedure Laterality Date   ABDOMINAL HYSTERECTOMY     ANTERIOR CERVICAL DECOMP/DISCECTOMY FUSION     I've had severa ORs; always went in from the front (03/13/2013)   APPENDECTOMY     BREAST LUMPECTOMY WITH RADIOACTIVE SEED AND SENTINEL LYMPH NODE BIOPSY Left 09/02/2022   Procedure: LEFT BREAST LUMPECTOMY WITH RADIOACTIVE SEED AND SENTINEL LYMPH NODE BIOPSY;  Surgeon: Vernetta Berg, MD;  Location: [facility];  Service: General;  Laterality: Left;   CARDIAC CATHETERIZATION     several (03/13/2013)   CARPAL TUNNEL RELEASE Right    CATARACT EXTRACTION W/ INTRAOCULAR LENS  IMPLANT, BILATERAL Bilateral    CHOLECYSTECTOMY     KNEE ARTHROSCOPY Left    SHOULDER ARTHROSCOPY W/ ROTATOR CUFF REPAIR Left     TONSILLECTOMY     TOTAL THYROIDECTOMY     TRANSCAROTID ARTERY REVASCULARIZATION  Left 09/24/2021   Procedure: LEFT TRANSCAROTID ARTERY REVASCULARIZATION;  Surgeon: Magda Debby SAILOR, MD;  Location: [facility];  Service: Vascular;  Laterality: Left;   TRANSCAROTID ARTERY REVASCULARIZATION  Right 10/23/2021   Procedure: Right Transcarotid Artery Revascularization;  Surgeon: Magda Debby SAILOR, MD;  Location: [facility];  Service: Vascular;  Laterality: Right;   ULTRASOUND GUIDANCE FOR VASCULAR ACCESS Right 09/24/2021   Procedure: ULTRASOUND GUIDANCE FOR VASCULAR ACCESS;  Surgeon: Magda Debby SAILOR, MD;  [facility];  Service: Vascular;  Laterality: Right;   ULTRASOUND GUIDANCE FOR VASCULAR ACCESS Right 10/23/2021   Procedure: ULTRASOUND GUIDANCE FOR VASCULAR ACCESS, LEFT FEMORAL VEIN;  Surgeon: Magda Debby SAILOR, MD;  [facility];  Service: Vascular;  Laterality: Right;   Past Medical History:  Diagnosis Date   Arthritis    all over my body (03/13/2013)   Asthma    Asymptomatic carotid artery stenosis, bilateral 10/08/2018   Breast cancer (HCC)    GERD (gastroesophageal reflux disease)    H/O hiatal hernia    Headache    pt states she has had headaches for about 6 months off and on   Heart murmur    pt had echocardiogram on 09/17/21   Hypercholesterolemia 10/08/2018   Hypertension    Hypothyroidism    Myasthenia gravis (HCC)    in my eyes; diagnsosed > 7 yr ago (03/13/2013)   Sleep apnea    on CPAP   Stroke (HCC) 2021   Thyroid  carcinoma (HCC)    Type II diabetes mellitus (HCC)    BP (!) 147/84 (BP Location: Left Arm, Cuff Size: Normal)   Pulse 93   Ht 5' 3 (1.6 m)   Wt 206 lb 6.4 oz (93.6 kg)   SpO2 97%   BMI 36.56 kg/m   Opioid Risk Score:   Fall Risk Score:  `1  Depression screen [facility] 2/9     03/10/2024   10:53 AM 03/09/2024    8:54 AM 08/11/2023   10:11 AM 10/22/2020    9:30 AM 08/02/2020   10:34 AM 03/22/2020   10:07 AM 02/22/2020   10:12 AM  Depression screen PHQ  2/9  Decreased Interest 0 0 0 0 0 0 0  Down, Depressed, Hopeless 0 0 0 0 0 0 0  PHQ - 2 Score 0 0 0 0 0 0 0  Altered sleeping 0        Tired, decreased energy 0        Change in appetite 0        Feeling bad or failure about yourself  0  Trouble concentrating 0        Moving slowly or fidgety/restless 0        Suicidal thoughts 0        PHQ-9 Score 0        Difficult doing work/chores Not difficult at all           Review of Systems  Endocrine:       Diabetes Mellitus II  Musculoskeletal:  Positive for arthralgias, gait problem and myalgias.       Bilateral leg and feet pain Uses walker  Neurological:  Positive for weakness and numbness.       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 Discussed that this is a capsaicin patch, stronger than capsaicin cream. Discussed that it is currently approved for diabetic peripheral neuropathy and post-herpetic neuralgia, but that it has also shown benefit in treating other forms of neuropathy. Provided patient with link to site to learn more about the patch: https://www.clark.biz/. Discussed that the patch would be placed in office and benefits usually last 3 months. Discussed that unintended exposure to capsaicin can cause severe irritation of eyes, mucous membranes, respiratory tract, and skin, but that Qutenza is a local treatment and does not have the systemic side effects of other nerve medications. Discussed that there may be pain, itching,  erythema, and decreased sensory function associated with the application of Qutenza. Side effects usually subside within 1 week. A cold pack of analgesic medications can help with these side effects. Blood pressure can also be increased due to pain associated with administration of the patch.   -discussed that Journavx is a highly selective inhibitor for Nav 1.8, which is specific for pain in the peripheral nervous system, discussed that lidocaine  in contrast affects all Nav receptors, discussed that patient can try using this medication as prn for pain, though its studies have focused on its use for acute pain. Discussed that it has been studied against opioids for acute pain with comparable efficacy. Discussed that I have not seen any patient side effects thus far. Discussed that we have samples available and we have copay cards available. Discussed that outpatient if the medication requires a prior auth the copay should be $30 for at least a 60 day supply. The medication may be more likely to be in stock in [facility] and [facility]. We do have samples available.   -Discussed current symptoms of pain and history of pain.  -Discussed benefits of exercise in reducing pain. -Discussed following foods that may reduce pain: 1) Ginger (especially studied for arthritis)- reduce leukotriene production to decrease inflammation 2) Blueberries- high in phytonutrients that decrease inflammation 3) Salmon- marine omega-3s reduce joint swelling and pain 4) Pumpkin seeds- reduce inflammation 5) dark chocolate- reduces inflammation 6) turmeric- reduces inflammation 7) tart cherries - reduce pain and stiffness 8) extra virgin olive oil - its compound olecanthal helps to block prostaglandins  9) chili peppers- can be eaten or applied topically via capsaicin 10) mint-  helpful for headache, muscle aches, joint pain, and itching 11) garlic- reduces inflammation 12) Green tea- reduces inflammation and oxidative stress, helps  with weight loss, may reduce the risk of cancer, recommend Double [employer] of Tea daily  Link to further information on diet for chronic pain: http://www.bray.com/   2) Class 2 obesity: -continue mounjaro -encouraged eating fruits and vegetables -discussed that she eats some meats -discussed that she does not eat a lot of processed foods -discussed that she eats potatoes and rice from time to time -discussed that she eats a lot of vegetables -discussed that she avoids fried stuff  3) Migraines: -recommended avoiding eggs -change topamax  to 100mg  HS

## 2024-03-10 NOTE — Patient Instructions (Signed)
 Foods that may reduce pain: 1) Ginger (especially studied for arthritis)- reduce leukotriene production to decrease inflammation 2) Blueberries- high in phytonutrients that decrease inflammation 3) Salmon- marine omega-3s reduce joint swelling and pain 4) Pumpkin seeds- reduce inflammation 5) dark chocolate- reduces inflammation 6) turmeric- reduces inflammation 7) tart cherries - reduce pain and stiffness 8) extra virgin olive oil - its compound olecanthal helps to block prostaglandins  9) chili peppers- can be eaten or applied topically via capsaicin 10) mint- helpful for headache, muscle aches, joint pain, and itching 11) garlic- reduces inflammation 12) Green tea- reduces inflammation and oxidative stress, helps with weight loss, may reduce the risk of cancer, recommend Double Green [place] of Tea daily  Link to further information on diet for chronic pain: [email or web address].bray.com/    [employer]  [employer] feet and nerves

## 2024-03-13 ENCOUNTER — Encounter (HOSPITAL_BASED_OUTPATIENT_CLINIC_OR_DEPARTMENT_OTHER): Admitting: Physical Medicine and Rehabilitation

## 2024-03-13 DIAGNOSIS — Z794 Long term (current) use of insulin: Secondary | ICD-10-CM | POA: Diagnosis not present

## 2024-03-13 DIAGNOSIS — E1149 Type 2 diabetes mellitus with other diabetic neurological complication: Secondary | ICD-10-CM | POA: Diagnosis not present

## 2024-03-13 DIAGNOSIS — E1140 Type 2 diabetes mellitus with diabetic neuropathy, unspecified: Secondary | ICD-10-CM

## 2024-03-13 LAB — METHYLMALONIC ACID, SERUM: Methylmalonic Acid, Quantitative: 129 nmol/L (ref 0–378)

## 2024-03-13 MED ORDER — LIDOCAINE 5 % EX PTCH: 2.0000 | MEDICATED_PATCH | CUTANEOUS | 0 refills | Status: DC

## 2024-03-13 NOTE — Progress Notes (Signed)
 Subjective:    Patient ID: [name], female    DOB: [birth year], 73 y.o.   MRN: [id number]  HPI An audio/video tele-health visit is felt to be the most appropriate encounter for this patient at this time. This is a follow up tele-visit via phone. The patient is at home. MD is at office. Prior to scheduling this appointment, our staff discussed the limitations of evaluation and management by telemedicine and the availability of in-person appointments. The patient expressed understanding and agreed to proceed.   [name] is a 73 year old woman who presents regarding neuropathy  1) Neuropathy -failed gabapentin , tylenol  extra strength, pregabalin , amitriptyline, cymbalta, vaseline, lidocaine  patch, topamax  -has never tried Journavx  -she asks about what supplement we had discussed last visit  2) Headache: -every day -she takes day and night  Pain Inventory Average Pain 9 Pain Right Now 8 My pain is stabbing and tingling  In the last 24 hours, has pain interfered with the following? General activity 5 Relation with others 5 Enjoyment of life 5 What TIME of day is your pain at its worst? night Sleep (in general) Fair  Pain is worse with: bedtime Pain improves with: rest Relief from Meds: 0  use a walker how many minutes can you walk? Walk around the house all throughout the day ability to climb steps?  yes do you drive?  no Do you have any goals in this area?  yes  retired  weakness numbness tingling trouble walking confusion  Any changes since last visit?  no  Primary care Lonell Collet, DO   Neurologist Eather Popp, MD    Family History  Problem Relation Age of Onset   Coronary artery disease Sister        s/p coronary stenting   Stroke Sister 27   Diabetes Sister    Congestive Heart Failure Mother    Asthma Mother    Diabetes Mother    Congestive Heart Failure Father    Lung cancer Father    Asthma Father    Diabetes Father    Diabetes  Brother    Social History   Socioeconomic History   Marital status: Single    Spouse name: Not on file   Number of children: 0   Years of education: college   Highest education level: Not on file  Occupational History   Occupation: retired  Tobacco Use   Smoking status: Never   Smokeless tobacco: Never  Vaping Use   Vaping status: Never Used  Substance and Sexual Activity   Alcohol use: No    Alcohol/week: 0.0 standard drinks of alcohol   Drug use: No   Sexual activity: Never  Other Topics Concern   Not on file  Social History Narrative   Lives alone   Right handed   Drinks no caffeine   Social Drivers of Corporate investment banker Strain: Low Risk  (08/12/2022)   Overall Financial Resource Strain (CARDIA)    Difficulty of Paying Living Expenses: Not very hard  Food Insecurity: No Food Insecurity (03/09/2024)   Hunger Vital Sign    Worried About Running Out of Food in the Last Year: Never true    Ran Out of Food in the Last Year: Never true  Transportation Needs: No Transportation Needs (03/09/2024)   PRAPARE - Administrator, Civil Service (Medical): No    Lack of Transportation (Non-Medical): No  Physical Activity: Not on file  Stress: Not on file  Social  Connections: Moderately Isolated (10/29/2023)   Social Connection and Isolation Panel    Frequency of Communication with Friends and Family: More than three times a week    Frequency of Social Gatherings with Friends and Family: More than three times a week    Attends Religious Services: Never    Database administrator or Organizations: No    Attends Engineer, structural: 1 to 4 times per year    Marital Status: Never married   Past Surgical History:  Procedure Laterality Date   ABDOMINAL HYSTERECTOMY     ANTERIOR CERVICAL DECOMP/DISCECTOMY FUSION     I've had severa ORs; always went in from the front (03/13/2013)   APPENDECTOMY     BREAST LUMPECTOMY WITH RADIOACTIVE SEED AND SENTINEL  LYMPH NODE BIOPSY Left 09/02/2022   Procedure: LEFT BREAST LUMPECTOMY WITH RADIOACTIVE SEED AND SENTINEL LYMPH NODE BIOPSY;  Surgeon: Vernetta Berg, MD;  [facility];  Service: General;  Laterality: Left;   CARDIAC CATHETERIZATION     several (03/13/2013)   CARPAL TUNNEL RELEASE Right    CATARACT EXTRACTION W/ INTRAOCULAR LENS  IMPLANT, BILATERAL Bilateral    CHOLECYSTECTOMY     KNEE ARTHROSCOPY Left    SHOULDER ARTHROSCOPY W/ ROTATOR CUFF REPAIR Left    TONSILLECTOMY     TOTAL THYROIDECTOMY     TRANSCAROTID ARTERY REVASCULARIZATION  Left 09/24/2021   Procedure: LEFT TRANSCAROTID ARTERY REVASCULARIZATION;  Surgeon: Magda Debby SAILOR, MD;  Location: [facility];  Service: Vascular;  Laterality: Left;   TRANSCAROTID ARTERY REVASCULARIZATION  Right 10/23/2021   Procedure: Right Transcarotid Artery Revascularization;  Surgeon: Magda Debby SAILOR, MD;  [facility];  Service: Vascular;  Laterality: Right;   ULTRASOUND GUIDANCE FOR VASCULAR ACCESS Right 09/24/2021   Procedure: ULTRASOUND GUIDANCE FOR VASCULAR ACCESS;  Surgeon: Magda Debby SAILOR, MD;  [facility]: [facility];  Service: Vascular;  Laterality: Right;   ULTRASOUND GUIDANCE FOR VASCULAR ACCESS Right 10/23/2021   Procedure: ULTRASOUND GUIDANCE FOR VASCULAR ACCESS, LEFT FEMORAL VEIN;  Surgeon: Magda Debby SAILOR, MD;  [facility]: [facility];  Service: Vascular;  Laterality: Right;   Past Medical History:  Diagnosis Date   Arthritis    all over my body (03/13/2013)   Asthma    Asymptomatic carotid artery stenosis, bilateral 10/08/2018   Breast cancer (HCC)    GERD (gastroesophageal reflux disease)    H/O hiatal hernia    Headache    pt states she has had headaches for about 6 months off and on   Heart murmur    pt had echocardiogram on 09/17/21   Hypercholesterolemia 10/08/2018   Hypertension    Hypothyroidism    Myasthenia gravis (HCC)    in my eyes; diagnsosed > 7 yr ago (03/13/2013)   Sleep apnea    on CPAP   Stroke (HCC) 2021    Thyroid  carcinoma (HCC)    Type II diabetes mellitus (HCC)    There were no vitals taken for this visit.  Opioid Risk Score:   Fall Risk Score:  `1  Depression screen [facility] 2/9     03/10/2024   10:53 AM 03/09/2024    8:54 AM 08/11/2023   10:11 AM 10/22/2020    9:30 AM 08/02/2020   10:34 AM 03/22/2020   10:07 AM 02/22/2020   10:12 AM  Depression screen PHQ 2/9  Decreased Interest 0 0 0 0 0 0 0  Down, Depressed, Hopeless 0 0 0 0 0 0 0  PHQ - 2 Score 0 0 0 0  0 0 0  Altered sleeping 0        Tired, decreased energy 0        Change in appetite 0        Feeling bad or failure about yourself  0        Trouble concentrating 0        Moving slowly or fidgety/restless 0        Suicidal thoughts 0        PHQ-9 Score 0        Difficult doing work/chores Not difficult at all           Review of Systems  Endocrine:       Diabetes Mellitus II  Musculoskeletal:  Positive for arthralgias, gait problem and myalgias.       Bilateral leg and feet pain Uses walker  Neurological:  Positive for weakness and numbness.       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 Discussed that this is a capsaicin patch, stronger than capsaicin cream. Discussed that it is currently approved for diabetic peripheral neuropathy and post-herpetic neuralgia, but that it has also shown benefit in treating other forms of neuropathy. Provided patient with link to site to learn more about the patch:  https://www.clark.biz/. Discussed that the patch would be placed in office and benefits usually last 3 months. Discussed that unintended exposure to capsaicin can cause severe irritation of eyes, mucous membranes, respiratory tract, and skin, but that Qutenza is a local treatment and does not have the systemic side effects of other nerve medications. Discussed that there may be pain, itching, erythema, and decreased sensory function associated with the application of Qutenza. Side effects usually subside within 1 week. A cold pack of analgesic medications can help with these side effects. Blood pressure can also be increased due to pain associated with administration of the patch.   -discussed that Journavx  is a highly selective inhibitor for Nav 1.8, which is specific for pain in the peripheral nervous system, discussed that lidocaine  in contrast affects all Nav receptors, discussed that patient can try using this medication as prn for pain, though its studies have focused on its use for acute pain. Discussed that it has been studied against opioids for acute pain with comparable efficacy. Discussed that I have not seen any patient side effects thus far. Discussed that we have samples available and we have copay cards available. Discussed that outpatient if the medication requires a prior auth the copay should be $30 for at least a 60 day supply. The medication may be more likely to be in stock in [facility] and [facility]. We do have samples available.   -discussed methylated B12 and folate for her neuropathy, cognition, and prevention of heart disease and stroke, emailed her a link to this supplement, discussed lidocaine  patches for her neuropathy  -Discussed current symptoms of pain and history of pain.  -Discussed benefits of exercise in reducing pain. -Discussed following  foods that may reduce pain: 1) Ginger (especially studied for arthritis)- reduce leukotriene production to decrease inflammation 2)  Blueberries- high in phytonutrients that decrease inflammation 3) Salmon- marine omega-3s reduce joint swelling and pain 4) Pumpkin seeds- reduce inflammation 5) dark chocolate- reduces inflammation 6) turmeric- reduces inflammation 7) tart cherries - reduce pain and stiffness 8) extra virgin olive oil - its compound olecanthal helps to block prostaglandins  9) chili peppers- can be eaten or applied topically via capsaicin 10) mint- helpful for headache, muscle aches, joint pain, and itching 11) garlic- reduces inflammation 12) Green tea- reduces inflammation and oxidative stress, helps with weight loss, may reduce the risk of cancer, recommend Double [employer] of Tea daily  Link to further information on diet for chronic pain: http://www.bray.com/   2) Class 2 obesity: -continue mounjaro -encouraged eating fruits and vegetables -discussed that she eats some meats -discussed that she does not eat a lot of processed foods -discussed that she eats potatoes and rice from time to time -discussed that she eats a lot of vegetables -discussed that she avoids fried stuff  3) Migraines: -recommended avoiding eggs -change topamax  to 100mg  HS  5 minutes spent in discussion of methylated B12 and folate for her neuropathy, cognition, and prevention of heart disease and stroke, emailed her a link to this supplement, discussed lidocaine  patches for her neuropathy

## 2024-03-17 ENCOUNTER — Ambulatory Visit: Payer: Self-pay | Admitting: Hematology

## 2024-03-17 MED ORDER — POTASSIUM CHLORIDE CRYS ER 10 MEQ PO TBCR: 10.0000 meq | EXTENDED_RELEASE_TABLET | Freq: Every day | ORAL | 0 refills | Status: DC

## 2024-03-20 ENCOUNTER — Telehealth: Payer: Self-pay

## 2024-03-20 ENCOUNTER — Telehealth: Payer: Self-pay | Admitting: Neurology

## 2024-03-20 ENCOUNTER — Encounter: Payer: Self-pay | Admitting: Neurology

## 2024-03-20 NOTE — Telephone Encounter (Signed)
 Spoke with pt via telephone regarding recent labs.  Stated that pt's anemia has resolved.  Stated that pt's B12 is low and Dr. Lanny would like for the pt to take OTC B12 1000mcg daily and to take K+ as prescribed.  Stated pt K+ is low as well; therefore, Dr. Lanny sent a prescription for K+ to pt's pharmacy.  Pt stated she has picked up the prescription and started taking the K+ as prescribed.  Also, recommended the pt to eat foods high in K+.  Pt verbalized understanding and had no further questions or concerns.

## 2024-03-20 NOTE — Telephone Encounter (Signed)
 Pt called  stating that MD  refill medication for 30 days . Pt is requestung to get a 90 day supply. Pt doesn't have transportation every month to go get medication . Pt request the 90 day supply so she would have her medication .  clopidogrel  (PLAVIX ) 75 MG tablet   Pt would like medication  to be sent to     1 ordered   Pharmacy: [id number] - [place], [place] - [address] (Ph: [phone])

## 2024-03-20 NOTE — Telephone Encounter (Signed)
 error

## 2024-03-22 MED ORDER — CLOPIDOGREL BISULFATE 75 MG PO TABS: 75.0000 mg | ORAL_TABLET | Freq: Every day | ORAL | 1 refills | Status: AC

## 2024-03-22 NOTE — Telephone Encounter (Signed)
 Called the patient back. I can switch the medication to 90 day supply and have sent to pharmacy as requested. There was no answer. LVM advising script was sent

## 2024-03-31 ENCOUNTER — Other Ambulatory Visit: Payer: Self-pay

## 2024-05-01 DIAGNOSIS — I69393 Ataxia following cerebral infarction: Secondary | ICD-10-CM | POA: Diagnosis not present

## 2024-05-01 DIAGNOSIS — I6930 Unspecified sequelae of cerebral infarction: Secondary | ICD-10-CM | POA: Diagnosis not present

## 2024-05-01 DIAGNOSIS — K5900 Constipation, unspecified: Secondary | ICD-10-CM | POA: Diagnosis not present

## 2024-05-01 DIAGNOSIS — G620 Drug-induced polyneuropathy: Secondary | ICD-10-CM | POA: Diagnosis not present

## 2024-05-01 DIAGNOSIS — R490 Dysphonia: Secondary | ICD-10-CM | POA: Diagnosis not present

## 2024-05-01 DIAGNOSIS — G4733 Obstructive sleep apnea (adult) (pediatric): Secondary | ICD-10-CM | POA: Diagnosis not present

## 2024-05-09 DIAGNOSIS — Z5181 Encounter for therapeutic drug level monitoring: Secondary | ICD-10-CM | POA: Diagnosis not present

## 2024-05-09 DIAGNOSIS — M833 Adult osteomalacia due to malnutrition: Secondary | ICD-10-CM | POA: Diagnosis not present

## 2024-05-09 DIAGNOSIS — G7000 Myasthenia gravis without (acute) exacerbation: Secondary | ICD-10-CM | POA: Diagnosis not present

## 2024-05-15 ENCOUNTER — Encounter: Admitting: Physical Medicine and Rehabilitation

## 2024-05-15 ENCOUNTER — Telehealth: Payer: Self-pay

## 2024-05-15 NOTE — Telephone Encounter (Signed)
 Patient states she spokes with you earlier today and has questions about changes to medications that were made.  Patient would like a call back if at all possible.  Verified patients phone number in the chart.

## 2024-05-17 ENCOUNTER — Ambulatory Visit: Attending: Physical Medicine and Rehabilitation | Admitting: Physical Medicine and Rehabilitation

## 2024-05-17 DIAGNOSIS — E1149 Type 2 diabetes mellitus with other diabetic neurological complication: Secondary | ICD-10-CM | POA: Diagnosis not present

## 2024-05-17 DIAGNOSIS — E1140 Type 2 diabetes mellitus with diabetic neuropathy, unspecified: Secondary | ICD-10-CM | POA: Insufficient documentation

## 2024-05-17 DIAGNOSIS — Z794 Long term (current) use of insulin: Secondary | ICD-10-CM | POA: Diagnosis not present

## 2024-05-17 NOTE — Progress Notes (Signed)
 Subjective:    Patient ID: [name], female    DOB: 02-16-1951, 73 y.o.   MRN: [id number]  HPI An audio/video tele-health visit is felt to be the most appropriate encounter for this patient at this time. This is a follow up tele-visit via phone. The patient is at home. MD is at office. Prior to scheduling this appointment, our staff discussed the limitations of evaluation and management by telemedicine and the availability of in-person appointments. The patient expressed understanding and agreed to proceed.   [name] is a 73 year old woman who presents regarding neuropathy  1) Neuropathy -failed gabapentin , tylenol  extra strength, pregabalin , amitriptyline, cymbalta, vaseline, lidocaine  patch, topamax  -has never tried Journavx  -she asks about what supplement we had discussed last visit -Qutenza patches were not approved, she is upset about this because she paid someone to bring her here for the patches -she applies lidocaine  patches to her feet -a friend told her to apply Vix on her feet and that has been helping  2) Headache: -every day -she takes day and night  Pain Inventory Average Pain 9 Pain Right Now 8 My pain is stabbing and tingling  In the last 24 hours, has pain interfered with the following? General activity 5 Relation with others 5 Enjoyment of life 5 What TIME of day is your pain at its worst? night Sleep (in general) Fair  Pain is worse with: bedtime Pain improves with: rest Relief from Meds: 0  use a walker how many minutes can you walk? Walk around the house all throughout the day ability to climb steps?  yes do you drive?  no Do you have any goals in this area?  yes  retired  weakness numbness tingling trouble walking confusion  Any changes since last visit?  no  Primary care [name] Collet, DO   Neurologist [name] Popp, MD    Family History  Problem Relation Age of Onset   Coronary artery disease Sister        s/p coronary  stenting   Stroke Sister 82   Diabetes Sister    Congestive Heart Failure Mother    Asthma Mother    Diabetes Mother    Congestive Heart Failure Father    Lung cancer Father    Asthma Father    Diabetes Father    Diabetes Brother    Social History   Socioeconomic History   Marital status: Single    Spouse name: Not on file   Number of children: 0   Years of education: college   Highest education level: Not on file  Occupational History   Occupation: retired  Tobacco Use   Smoking status: Never   Smokeless tobacco: Never  Vaping Use   Vaping status: Never Used  Substance and Sexual Activity   Alcohol use: No    Alcohol/week: 0.0 standard drinks of alcohol   Drug use: No   Sexual activity: Never  Other Topics Concern   Not on file  Social History Narrative   Lives alone   Right handed   Drinks no caffeine   Social Drivers of Corporate investment banker Strain: Low Risk  (08/12/2022)   Overall Financial Resource Strain (CARDIA)    Difficulty of Paying Living Expenses: Not very hard  Food Insecurity: No Food Insecurity (03/09/2024)   Hunger Vital Sign    Worried About Running Out of Food in the Last Year: Never true    Ran Out of Food in the Last Year:  Never true  Transportation Needs: No Transportation Needs (03/09/2024)   PRAPARE - Administrator, Civil Service (Medical): No    Lack of Transportation (Non-Medical): No  Physical Activity: Not on file  Stress: Not on file  Social Connections: Moderately Isolated (10/29/2023)   Social Connection and Isolation Panel    Frequency of Communication with Friends and Family: More than three times a week    Frequency of Social Gatherings with Friends and Family: More than three times a week    Attends Religious Services: Never    Database administrator or Organizations: No    Attends Engineer, structural: 1 to 4 times per year    Marital Status: Never married   Past Surgical History:  Procedure  Laterality Date   ABDOMINAL HYSTERECTOMY     ANTERIOR CERVICAL DECOMP/DISCECTOMY FUSION     I've had severa ORs; always went in from the front (03/13/2013)   APPENDECTOMY     BREAST LUMPECTOMY WITH RADIOACTIVE SEED AND SENTINEL LYMPH NODE BIOPSY Left 09/02/2022   Procedure: LEFT BREAST LUMPECTOMY WITH RADIOACTIVE SEED AND SENTINEL LYMPH NODE BIOPSY;  Surgeon: Vernetta Berg, MD;  Location: [facility] OR;  Service: General;  Laterality: Left;   CARDIAC CATHETERIZATION     several (03/13/2013)   CARPAL TUNNEL RELEASE Right    CATARACT EXTRACTION W/ INTRAOCULAR LENS  IMPLANT, BILATERAL Bilateral    CHOLECYSTECTOMY     KNEE ARTHROSCOPY Left    SHOULDER ARTHROSCOPY W/ ROTATOR CUFF REPAIR Left    TONSILLECTOMY     TOTAL THYROIDECTOMY     TRANSCAROTID ARTERY REVASCULARIZATION  Left 09/24/2021   Procedure: LEFT TRANSCAROTID ARTERY REVASCULARIZATION;  Surgeon: Magda Debby SAILOR, MD;  Location: [facility] OR;  Service: Vascular;  Laterality: Left;   TRANSCAROTID ARTERY REVASCULARIZATION  Right 10/23/2021   Procedure: Right Transcarotid Artery Revascularization;  Surgeon: Magda Debby SAILOR, MD;  Location: [facility] OR;  Service: Vascular;  Laterality: Right;   ULTRASOUND GUIDANCE FOR VASCULAR ACCESS Right 09/24/2021   Procedure: ULTRASOUND GUIDANCE FOR VASCULAR ACCESS;  Surgeon: Magda Debby SAILOR, MD;  Location: [facility] OR;  Service: Vascular;  Laterality: Right;   ULTRASOUND GUIDANCE FOR VASCULAR ACCESS Right 10/23/2021   Procedure: ULTRASOUND GUIDANCE FOR VASCULAR ACCESS, LEFT FEMORAL VEIN;  Surgeon: Magda Debby SAILOR, MD;  Location: [facility] OR;  Service: Vascular;  Laterality: Right;   Past Medical History:  Diagnosis Date   Arthritis    all over my body (03/13/2013)   Asthma    Asymptomatic carotid artery stenosis, bilateral 10/08/2018   Breast cancer (HCC)    GERD (gastroesophageal reflux disease)    H/O hiatal hernia    Headache    pt states she has had headaches for about 6 months off and on   Heart murmur     pt had echocardiogram on 09/17/21   Hypercholesterolemia 10/08/2018   Hypertension    Hypothyroidism    Myasthenia gravis (HCC)    in my eyes; diagnsosed > 7 yr ago (03/13/2013)   Sleep apnea    on CPAP   Stroke (HCC) 2021   Thyroid  carcinoma (HCC)    Type II diabetes mellitus (HCC)    There were no vitals taken for this visit.  Opioid Risk Score:   Fall Risk Score:  `1  Depression screen [facility] 2/9     03/10/2024   10:53 AM 03/09/2024    8:54 AM 08/11/2023   10:11 AM 10/22/2020    9:30 AM 08/02/2020   10:34 AM 03/22/2020  10:07 AM 02/22/2020   10:12 AM  Depression screen PHQ 2/9  Decreased Interest 0 0 0 0 0 0 0  Down, Depressed, Hopeless 0 0 0 0 0 0 0  PHQ - 2 Score 0 0 0 0 0 0 0  Altered sleeping 0        Tired, decreased energy 0        Change in appetite 0        Feeling bad or failure about yourself  0        Trouble concentrating 0        Moving slowly or fidgety/restless 0        Suicidal thoughts 0        PHQ-9 Score 0        Difficult doing work/chores Not difficult at all           Review of Systems  Endocrine:       Diabetes Mellitus II  Musculoskeletal:  Positive for arthralgias, gait problem and myalgias.       Bilateral leg and feet pain Uses walker  Neurological:  Positive for weakness and numbness.       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 Discussed that this is a capsaicin patch, stronger than capsaicin cream. Discussed that it is currently  approved for diabetic peripheral neuropathy and post-herpetic neuralgia, but that it has also shown benefit in treating other forms of neuropathy. Provided patient with link to site to learn more about the patch: https://www.clark.biz/. Discussed that the patch would be placed in office and benefits usually last 3 months. Discussed that unintended exposure to capsaicin can cause severe irritation of eyes, mucous membranes, respiratory tract, and skin, but that Qutenza is a local treatment and does not have the systemic side effects of other nerve medications. Discussed that there may be pain, itching, erythema, and decreased sensory function associated with the application of Qutenza. Side effects usually subside within 1 week. A cold pack of analgesic medications can help with these side effects. Blood pressure can also be increased due to pain associated with administration of the patch.   -discussed that Journavx  is a highly selective inhibitor for Nav 1.8, which is specific for pain in the peripheral nervous system, discussed that lidocaine  in contrast affects all Nav receptors, discussed that patient can try using this medication as prn for pain, though its studies have focused on its use for acute pain. Discussed that it has been studied against opioids for acute pain with comparable efficacy. Discussed that I have not seen any patient side effects thus far. Discussed that we have samples available and we have copay cards available. Discussed that outpatient if the medication requires a prior auth the copay should be $30 for at least a 60 day supply. The medication may be more likely to be in stock in [facility] and [facility]. We do have samples available.   -discussed methylated B12 and folate for her  neuropathy, cognition, and prevention of heart disease and stroke, emailed her a link to this supplement, discussed lidocaine  patches for her neuropathy  -continue Vix vapor run on feet  -discussed her  neuropathy, messaged Keesha to see if patches were improved, discussed that she is gaining weight because prednisone  dose was increased to 20mg , discussed that she watches what she eats  -Discussed current symptoms of pain and history of pain.  -Discussed benefits of exercise in reducing pain. -Discussed following foods that may reduce pain: 1) Ginger (especially studied for arthritis)- reduce leukotriene production to decrease inflammation 2) Blueberries- high in phytonutrients that decrease inflammation 3) Salmon- marine omega-3s reduce joint swelling and pain 4) Pumpkin seeds- reduce inflammation 5) dark chocolate- reduces inflammation 6) turmeric- reduces inflammation 7) tart cherries - reduce pain and stiffness 8) extra virgin olive oil - its compound olecanthal helps to block prostaglandins  9) chili peppers- can be eaten or applied topically via capsaicin 10) mint- helpful for headache, muscle aches, joint pain, and itching 11) garlic- reduces inflammation 12) Green tea- reduces inflammation and oxidative stress, helps with weight loss, may reduce the risk of cancer, recommend Double [employer] of Tea daily  Link to further information on diet for chronic pain: http://www.bray.com/   2) Class 2 obesity: -continue mounjaro -encouraged eating fruits and vegetables -discussed that she eats some meats -discussed that she does not eat a lot of processed foods -discussed that she eats potatoes and rice from time to time -discussed that she eats a lot of vegetables -discussed that she avoids fried stuff  3) Migraines: -recommended avoiding eggs -change topamax  to 100mg  HS  5 minutes spent in discussion of her neuropathy, messaged Keesha to see if patches were improved, discussed that she is gaining weight because prednisone  dose was increased to 20mg , discussed that she watches what she eats

## 2024-05-19 DIAGNOSIS — J984 Other disorders of lung: Secondary | ICD-10-CM | POA: Diagnosis not present

## 2024-05-19 DIAGNOSIS — E113591 Type 2 diabetes mellitus with proliferative diabetic retinopathy without macular edema, right eye: Secondary | ICD-10-CM | POA: Diagnosis not present

## 2024-05-19 DIAGNOSIS — Z79899 Other long term (current) drug therapy: Secondary | ICD-10-CM | POA: Diagnosis not present

## 2024-05-19 DIAGNOSIS — H3500 Unspecified background retinopathy: Secondary | ICD-10-CM | POA: Diagnosis not present

## 2024-05-19 DIAGNOSIS — I6350 Cerebral infarction due to unspecified occlusion or stenosis of unspecified cerebral artery: Secondary | ICD-10-CM | POA: Diagnosis not present

## 2024-05-19 DIAGNOSIS — H469 Unspecified optic neuritis: Secondary | ICD-10-CM | POA: Diagnosis not present

## 2024-05-19 DIAGNOSIS — I6381 Other cerebral infarction due to occlusion or stenosis of small artery: Secondary | ICD-10-CM | POA: Diagnosis not present

## 2024-05-19 DIAGNOSIS — G709 Myoneural disorder, unspecified: Secondary | ICD-10-CM | POA: Diagnosis not present

## 2024-05-19 DIAGNOSIS — G7000 Myasthenia gravis without (acute) exacerbation: Secondary | ICD-10-CM | POA: Diagnosis not present

## 2024-05-19 DIAGNOSIS — C73 Malignant neoplasm of thyroid gland: Secondary | ICD-10-CM | POA: Diagnosis not present

## 2024-05-19 DIAGNOSIS — D8989 Other specified disorders involving the immune mechanism, not elsewhere classified: Secondary | ICD-10-CM | POA: Diagnosis not present

## 2024-05-24 DIAGNOSIS — D8989 Other specified disorders involving the immune mechanism, not elsewhere classified: Secondary | ICD-10-CM | POA: Diagnosis not present

## 2024-05-24 DIAGNOSIS — H3500 Unspecified background retinopathy: Secondary | ICD-10-CM | POA: Diagnosis not present

## 2024-05-24 DIAGNOSIS — G7000 Myasthenia gravis without (acute) exacerbation: Secondary | ICD-10-CM | POA: Diagnosis not present

## 2024-05-24 DIAGNOSIS — Z79899 Other long term (current) drug therapy: Secondary | ICD-10-CM | POA: Diagnosis not present

## 2024-05-26 DIAGNOSIS — E890 Postprocedural hypothyroidism: Secondary | ICD-10-CM | POA: Diagnosis not present

## 2024-05-26 DIAGNOSIS — Z9641 Presence of insulin pump (external) (internal): Secondary | ICD-10-CM | POA: Diagnosis not present

## 2024-05-26 DIAGNOSIS — G620 Drug-induced polyneuropathy: Secondary | ICD-10-CM | POA: Diagnosis not present

## 2024-05-26 DIAGNOSIS — E7800 Pure hypercholesterolemia, unspecified: Secondary | ICD-10-CM | POA: Diagnosis not present

## 2024-05-26 DIAGNOSIS — E118 Type 2 diabetes mellitus with unspecified complications: Secondary | ICD-10-CM | POA: Diagnosis not present

## 2024-05-26 DIAGNOSIS — Z23 Encounter for immunization: Secondary | ICD-10-CM | POA: Diagnosis not present

## 2024-05-26 DIAGNOSIS — E559 Vitamin D deficiency, unspecified: Secondary | ICD-10-CM | POA: Diagnosis not present

## 2024-06-07 DIAGNOSIS — Z79899 Other long term (current) drug therapy: Secondary | ICD-10-CM | POA: Diagnosis not present

## 2024-06-07 DIAGNOSIS — D8989 Other specified disorders involving the immune mechanism, not elsewhere classified: Secondary | ICD-10-CM | POA: Diagnosis not present

## 2024-06-07 DIAGNOSIS — H3500 Unspecified background retinopathy: Secondary | ICD-10-CM | POA: Diagnosis not present

## 2024-06-07 DIAGNOSIS — G7000 Myasthenia gravis without (acute) exacerbation: Secondary | ICD-10-CM | POA: Diagnosis not present

## 2024-06-26 ENCOUNTER — Encounter: Payer: Self-pay | Admitting: *Deleted

## 2024-06-26 NOTE — Progress Notes (Signed)
 [name]                                          MRN: [id number]   06/26/2024   The [facility] Quality Team Specialist reviewed this patient medical record for the purposes of chart review for care gap closure. The following were reviewed: chart review for care gap closure-glycemic status assessment and kidney health evaluation for diabetes:eGFR  and uACR.    [facility] Quality Team

## 2024-07-19 ENCOUNTER — Other Ambulatory Visit: Payer: Self-pay | Admitting: Neurology

## 2024-07-19 DIAGNOSIS — G43009 Migraine without aura, not intractable, without status migrainosus: Secondary | ICD-10-CM

## 2024-07-19 NOTE — Telephone Encounter (Signed)
 Last seen on 02/17/24 Follow up scheduled on 09/07/24

## 2024-07-26 ENCOUNTER — Other Ambulatory Visit: Payer: Self-pay | Admitting: Nurse Practitioner

## 2024-07-27 ENCOUNTER — Encounter: Payer: Self-pay | Admitting: Hematology

## 2024-09-06 NOTE — Progress Notes (Signed)
 " [facility] [address] [place]. [place] [address] [phone]       OFFICE FOLLOW UP NOTE  [name] Date of Birth:  1951-05-22 Medical Record Number:  [id number]   Reason for visit: Stroke follow up    SUBJECTIVE:   CHIEF COMPLAINT:  Chief Complaint  Patient presents with   Follow-up    Patient in room 8 alone,  Patient is here for stroke follow up. Patient states no new or worsening symptoms, she still continues to get the headaches, with real sharp pains in her head, patient stated that her right side is weaker and numb.    Follow-up visit:  Prior visit: 02/17/2024 with Dr. Rosemarie   Brief HPI:   [name] is a 74 y.o. female with PMH of ischemic strokes (right pontine 3.2025 and left thalamic stroke on imaging in 10/2020), OSA, carotid stenosis  s/p L TCAR 09/2021 and R TCAR 10/2021, DM, HTN, HLD, ocular myasthenia gravis, history of breast and thyroid  cancer and chronic headaches.     Interval history:  Patient returns for follow-up visit after prior visit with Dr. Rosemarie 7 months ago for hospital follow-up regarding right pontine stroke.  Reports continued chronic right sided weakness and numbness, denies any changes since prior visit.  Ambulates with rolling walker, no recent falls.  Reports her current walker is missing pieces so it makes the walker unsteady and has difficulty folding sides in.  She has been having difficulty obtaining a new one.  No new stroke/TIA symptoms.  Remains on Plavix  and Zetia .  She does report excessive bruising on Plavix  but no significant bleeding such as in stool, urine or nose.  Blood pressure well-controlled.  Routinely follows with PCP for stroke risk factor management.    She continues to experience daily headaches that usually occur midday, describes as pressure/tight band or cap on her head, usually not debilitating, and has some sensitivity to light, will occasionally use Tylenol  about 2-3 times per  week, no longer using daily.  She has noted some decrease severity and daily headaches on increase topiramate  dosage, currently taking 50 mg twice daily.  She also takes gabapentin  100 mg nightly and pregabalin  as needed per PCP for neuropathy.  She also have occasional sharp stabbing pain especially with neck movement that starts in the back of her head and can radiate upwards, symptoms do not last long, occurs a couple times per month.    She is now being followed by [facility] for apnea management and was transition to noninvasive ventilation therapy as BiPAP therapy insufficient.  She does report nightly use, she has a follow-up next week.  Continues to follow with [facility] neurology for MG.         ROS:   14 system review of systems performed and negative with exception of those listed in HPI  PMH:  Past Medical History:  Diagnosis Date   Arthritis    all over my body (03/13/2013)   Asthma    Asymptomatic carotid artery stenosis, bilateral 10/08/2018   Breast cancer (HCC)    GERD (gastroesophageal reflux disease)    H/O hiatal hernia    Headache    pt states she has had headaches for about 6 months off and on   Heart murmur    pt had echocardiogram on 09/17/21   Hypercholesterolemia 10/08/2018   Hypertension    Hypothyroidism    Myasthenia gravis (HCC)    in my eyes; diagnsosed >  7 yr ago (03/13/2013)   Sleep apnea    on CPAP   Stroke (HCC) 2021   Thyroid  carcinoma (HCC)    Type II diabetes mellitus (HCC)     PSH:  Past Surgical History:  Procedure Laterality Date   ABDOMINAL HYSTERECTOMY     ANTERIOR CERVICAL DECOMP/DISCECTOMY FUSION     I've had severa ORs; always went in from the front (03/13/2013)   APPENDECTOMY     BREAST LUMPECTOMY WITH RADIOACTIVE SEED AND SENTINEL LYMPH NODE BIOPSY Left 09/02/2022   Procedure: LEFT BREAST LUMPECTOMY WITH RADIOACTIVE SEED AND SENTINEL LYMPH NODE BIOPSY;  Surgeon: Vernetta Berg, MD;  Location: [facility]  OR;  Service: General;  Laterality: Left;   CARDIAC CATHETERIZATION     several (03/13/2013)   CARPAL TUNNEL RELEASE Right    CATARACT EXTRACTION W/ INTRAOCULAR LENS  IMPLANT, BILATERAL Bilateral    CHOLECYSTECTOMY     KNEE ARTHROSCOPY Left    SHOULDER ARTHROSCOPY W/ ROTATOR CUFF REPAIR Left    TONSILLECTOMY     TOTAL THYROIDECTOMY     TRANSCAROTID ARTERY REVASCULARIZATION  Left 09/24/2021   Procedure: LEFT TRANSCAROTID ARTERY REVASCULARIZATION;  Surgeon: Magda Debby SAILOR, MD;  Location: [facility] OR;  Service: Vascular;  Laterality: Left;   TRANSCAROTID ARTERY REVASCULARIZATION  Right 10/23/2021   Procedure: Right Transcarotid Artery Revascularization;  Surgeon: Magda Debby SAILOR, MD;  Location: [facility] OR;  Service: Vascular;  Laterality: Right;   ULTRASOUND GUIDANCE FOR VASCULAR ACCESS Right 09/24/2021   Procedure: ULTRASOUND GUIDANCE FOR VASCULAR ACCESS;  Surgeon: Magda Debby SAILOR, MD;  Location: [facility] OR;  Service: Vascular;  Laterality: Right;   ULTRASOUND GUIDANCE FOR VASCULAR ACCESS Right 10/23/2021   Procedure: ULTRASOUND GUIDANCE FOR VASCULAR ACCESS, LEFT FEMORAL VEIN;  Surgeon: Magda Debby SAILOR, MD;  Location: [facility] OR;  Service: Vascular;  Laterality: Right;    Social History:  Social History   Socioeconomic History   Marital status: Single    Spouse name: Not on file   Number of children: 0   Years of education: college   Highest education level: Not on file  Occupational History   Occupation: retired  Tobacco Use   Smoking status: Never   Smokeless tobacco: Never  Vaping Use   Vaping status: Never Used  Substance and Sexual Activity   Alcohol use: No    Alcohol/week: 0.0 standard drinks of alcohol   Drug use: No   Sexual activity: Never  Other Topics Concern   Not on file  Social History Narrative   Lives alone   Right handed   Drinks no caffeine   retired   Social Drivers of Health   Tobacco Use: Low Risk  (05/19/2024)   Received from [facility]    Patient History    Smoking Tobacco Use: Never    Smokeless Tobacco Use: Never    Passive Exposure: Not on file  Financial Resource Strain: Low Risk (08/12/2022)   Overall Financial Resource Strain (CARDIA)    Difficulty of Paying Living Expenses: Not very hard  Food Insecurity: No Food Insecurity (03/09/2024)   Epic    Worried About Radiation Protection Practitioner of Food in the Last Year: Never true    [employer] of Food in the Last Year: Never true  Transportation Needs: No Transportation Needs (03/09/2024)   Epic    Lack of Transportation (Medical): No    Lack of Transportation (Non-Medical): No  Physical Activity: Not on file  Stress: Not on file  Social Connections: Moderately Isolated (10/29/2023)  Social Advertising Account Executive    Frequency of Communication with Friends and Family: More than three times a week    Frequency of Social Gatherings with Friends and Family: More than three times a week    Attends Religious Services: Never    Database Administrator or Organizations: No    Attends Banker Meetings: 1 to 4 times per year    Marital Status: Never married  Intimate Partner Violence: Not At Risk (03/09/2024)   Epic    Fear of Current or Ex-Partner: No    Emotionally Abused: No    Physically Abused: No    Sexually Abused: No  Depression (PHQ2-9): Low Risk (03/10/2024)   Depression (PHQ2-9)    PHQ-2 Score: 0  Alcohol Screen: Not on file  Housing: Unknown (03/09/2024)   Epic    Unable to Pay for Housing in the Last Year: No    Number of Times Moved in the Last Year: Not on file    Homeless in the Last Year: No  Utilities: Not At Risk (03/09/2024)   Epic    Threatened with loss of utilities: No  Health Literacy: Adequate Health Literacy (08/11/2023)   B1300 Health Literacy    Frequency of need for help with medical instructions: Never    Family History:  Family History  Problem Relation Age of Onset   Coronary artery disease Sister        s/p coronary stenting    Stroke Sister 3   Diabetes Sister    Congestive Heart Failure Mother    Asthma Mother    Diabetes Mother    Congestive Heart Failure Father    Lung cancer Father    Asthma Father    Diabetes Father    Diabetes Brother     Medications:  Medications Ordered Prior to Encounter[1]  Allergies:  Allergies[2]    OBJECTIVE:  Physical Exam  Vitals:   09/07/24 0842  BP: (!) 126/56  Pulse: 93  Weight: 191 lb 9.6 oz (86.9 kg)  Height: 5' 3 (1.6 m)   Body mass index is 33.94 kg/m. No results found.  General: well developed, well nourished, very pleasant elderly African female, seated, in no evident distress Head: head normocephalic and atraumatic.   Neck: supple with no carotid or supraclavicular bruits Cardiovascular: regular rate and rhythm, no murmurs Musculoskeletal: no deformity Skin:  no rash/petichiae Vascular:  Normal pulses all extremities   Neurologic Exam Mental Status: Awake and fully alert.  Fluent speech and language.  Oriented to place and time. Recent and remote memory intact. Attention span, concentration and fund of knowledge appropriate. Mood and affect appropriate.  Cranial Nerves: Vision acuity decreased in right eye.  Extraocular movements full without nystagmus. Visual fields full to confrontation. Hearing intact. Facial sensation intact. Face, tongue, palate moves normally and symmetrically.  Motor: Normal bulk and tone. Normal strength in all tested extremity muscles except mild weakness of right grip and intrinsic hand muscles Sensory.:  Decrease sensation bilateral lower extremities distally, subjective numbness outer left calf Coordination: Rapid alternating movements normal in all extremities some slightly decreased right hand. Finger-to-nose and heel-to-shin performed accurately bilaterally. Gait and Station: Arises from chair without difficulty. Stance is normal. Gait demonstrates decreased stride length and step height with mild imbalance and use of  rolling walker.          ASSESSMENT: 74 year-old African-American lady with chronic daily and history of prior strokes.  Remote history of left thalamic lacunar infarct  and more recent right pontine lacunar infarct in March 2025 from small vessel disease .  Vascular risk factors of diabetes, hypertension, hyperlipidemia, obesity and sleep apnea.  She also has seronegative ocular myasthenia which appears stable and well controlled on current medication regimen of CellCept  and Rituxan  followed by [facility] neurology.      PLAN:  Right pontine stroke:  History of prior stroke: Residual deficit: mild right sided deficits - overall stable. Continued use of RW at all times. DME order provided in attempts to obtain new RW as current walker has missing pieces which makes it unstable which can increase risk of falls Continue Plavix  and Zetia  10mg  daily for secondary stroke prevention managed/prescribed by PCP.  C/o excessive bruising on Plavix , discussed consideration of transitioning back to aspirin  but potential increased risk of recurrent stroke as prior stroke occurred despite aspirin  therapy, she wishes to continue on Plavix  for now. Continue to follow with [facility] pulmonology for apnea management Discussed secondary stroke prevention measures and importance of close PCP follow up for aggressive stroke risk factor management including BP goal<130/90, HLD with LDL goal<70 and DM with A1c.<7 .  I have gone over the pathophysiology of stroke, warning signs and symptoms, risk factors and their management in some detail with instructions to go to the closest emergency room for symptoms of concern.  Chronic headaches: Recommend trying to increase gabapentin  to 200 mg nightly and consider further increasing to 300 mg nightly if needed - she declines need of new rx at this time Continue topiramate  50 mg twice daily - can consider increasing in the future if needed Discussed limiting OTC pain relievers to  prevent rebound headache      Follow up in 6 months or call earlier if needed   CC:  [facility] provider: Dr. Rosemarie PCP: Gerome Brunet, DO    I personally spent a total of 45 minutes in the care of the patient today including preparing to see the patient, getting/reviewing separately obtained history, performing a medically appropriate exam/evaluation, counseling and educating, placing orders, referring and communicating with other health care professionals, and documenting clinical information in the EHR.  Harlene Bogaert, AGNP-BC  [facility] [address] [place], [place] [address]  Phone [phone] Fax [phone] Note: This document was prepared with digital dictation and possible smart phrase technology. Any transcriptional errors that result from this process are unintentional.         [1]  Current Outpatient Medications on File Prior to Visit  Medication Sig Dispense Refill   anastrozole  (ARIMIDEX ) 1 MG tablet Take 1 tablet (1 mg total) by mouth daily. 90 tablet 1   Biotin 5000 MCG CAPS Take 1 capsule by mouth daily.     clopidogrel  (PLAVIX ) 75 MG tablet Take 1 tablet (75 mg total) by mouth daily. 90 tablet 1   cycloSPORINE  (RESTASIS ) 0.05 % ophthalmic emulsion Place 1 drop into both eyes 2 (two) times daily.     diclofenac  Sodium (VOLTAREN ) 1 % GEL Apply 2 g topically as needed. Apply to legs and back as needed for pain     diphenhydrAMINE  (BENADRYL ) 50 MG capsule Take 50 mg by mouth every 6 (six) hours as needed (Take with infusion).     ELDERBERRY PO Take 1 tablet by mouth daily.     empagliflozin  (JARDIANCE ) 25 MG TABS tablet Take 25 mg by mouth daily.     Evolocumab with Infusor (REPATHA PUSHTRONEX SYSTEM) 420 MG/3.5ML SOCT Inject 420 mg into the skin every 30 (  thirty) days.     ezetimibe  (ZETIA ) 10 MG tablet TAKE 1 TABLET BY MOUTH EVERY DAY 90 tablet 1   furosemide (LASIX) 40 MG tablet Take 40 mg by mouth daily.     gabapentin   (NEURONTIN ) 100 MG capsule TAKE 1 TO 2 CAPSULES BY MOUTH AT BEDTIME 60 capsule 0   insulin  lispro (HUMALOG) 100 UNIT/ML injection Inject into the skin See admin instructions. Inject subcutaneously 3 (three) times daily with meals With meals through the Omnipod Dash Pump every 72 hours as directed     levothyroxine  (SYNTHROID ) 100 MCG tablet Take 1 tablet (100 mcg total) by mouth daily at 6 (six) AM. (Patient taking differently: Take 137 mcg by mouth daily at 6 (six) AM.)     lidocaine  (LIDODERM ) 5 % Place 2 patches onto the skin daily. Remove & Discard patch within 12 hours or as directed by MD 60 patch 0   meclizine  (ANTIVERT ) 25 MG tablet Take 25 mg by mouth daily.     montelukast  (SINGULAIR ) 10 MG tablet Take 1 tablet (10 mg total) by mouth every morning. 30 tablet 0   mycophenolate  (CELLCEPT ) 500 MG tablet Take 1,000 mg by mouth 2 (two) times daily. For myasthenia gravis 1000 mg in AM, 1500 mg in PM (Patient taking differently: Take 1,000 mg by mouth 5 (five) times daily. For myasthenia gravis 1000 mg in AM, 1500 mg in PM)     olmesartan  (BENICAR ) 20 MG tablet Take 20 mg by mouth daily.     pantoprazole  (PROTONIX ) 40 MG tablet Take 1 tablet (40 mg total) by mouth 2 (two) times daily. 60 tablet 0   predniSONE  (DELTASONE ) 2.5 MG tablet Take 7.5 mg by mouth every morning. For myasthenia gravis (Patient taking differently: Take 20 mg by mouth every morning. For myasthenia gravis)     pregabalin  (LYRICA ) 25 MG capsule Take 1 capsule (25 mg total) by mouth 2 (two) times daily. 180 capsule 0   PROAIR  HFA 108 (90 BASE) MCG/ACT inhaler Inhale 2 puffs into the lungs every 6 (six) hours as needed for wheezing or shortness of breath.      riTUXimab  (RITUXAN ) 500 MG/50ML injection Inject into the vein every 6 (six) months.     tirzepatide (MOUNJARO) 2.5 MG/0.5ML Pen Inject 2.5 mg into the skin once a week.     topiramate  (TOPAMAX ) 50 MG tablet Take 1 tablet by mouth twice daily 90 tablet 2   TRESIBA  FLEXTOUCH 100 UNIT/ML FlexTouch Pen Inject 15 Units into the skin daily.     vitamin B-12 (CYANOCOBALAMIN ) 1000 MCG tablet Take 1,000 mcg by mouth once a week.     Vitamin D , Ergocalciferol , (DRISDOL ) 50000 UNITS CAPS capsule Take 50,000 Units by mouth every Monday.      potassium chloride  (KLOR-CON  M) 10 MEQ tablet Take 1 tablet (10 mEq total) by mouth daily. (Patient not taking: Reported on 09/07/2024) 15 tablet 0   PRESCRIPTION MEDICATION Pt uses a cpap nightly     scopolamine (TRANSDERM-SCOP) 1 MG/3DAYS Place 1 patch onto the skin every 3 (three) days as needed. (Patient not taking: Reported on 09/07/2024)     Suzetrigine  (JOURNAVX ) 50 MG TABS Take 1 tablet by mouth daily as needed. (Patient not taking: Reported on 09/07/2024) 30 tablet 3   No current facility-administered medications on file prior to visit.  [2]  Allergies Allergen Reactions   Contrast Media [Iodinated Contrast Media] Anaphylaxis    01/10/15 and 09/18/2021 --PT GIVEN 13 HR PRE MEDS FOR CT with  contrast TOLERATED IV CONTRAST W/O ANY REACTION   Fluorescein Shortness Of Breath and Other (See Comments)    (Dye)   Iodine  Anaphylaxis    Contrast dye - iodine    Magnesium -Containing Compounds Other (See Comments)    H/O myasthenia gravis- caution replacing IV    Molds & Smuts Shortness Of Breath and Other (See Comments)    Congestion and wheezing, also   Shellfish Protein-Containing Drug Products Anaphylaxis, Shortness Of Breath, Swelling and Other (See Comments)    Welts, also   Pholcodine Hives   Azithromycin Nausea Only   Codeine Hives and Nausea Only   Metformin Hcl Er Nausea Only   Oxycodone  Nausea Only    Nausea 08/2022 admission.   Tramadol  Hcl Nausea Only   Other Rash and Other (See Comments)    Coban causes welts, also   "

## 2024-09-07 ENCOUNTER — Encounter: Payer: Self-pay | Admitting: Adult Health

## 2024-09-07 ENCOUNTER — Ambulatory Visit: Admitting: Adult Health

## 2024-09-07 VITALS — BP 126/56 | HR 93 | Ht 63.0 in | Wt 191.6 lb

## 2024-09-07 DIAGNOSIS — G4733 Obstructive sleep apnea (adult) (pediatric): Secondary | ICD-10-CM | POA: Diagnosis not present

## 2024-09-07 DIAGNOSIS — R269 Unspecified abnormalities of gait and mobility: Secondary | ICD-10-CM | POA: Diagnosis not present

## 2024-09-07 DIAGNOSIS — R519 Headache, unspecified: Secondary | ICD-10-CM

## 2024-09-07 DIAGNOSIS — Z8673 Personal history of transient ischemic attack (TIA), and cerebral infarction without residual deficits: Secondary | ICD-10-CM | POA: Diagnosis not present

## 2024-09-07 DIAGNOSIS — I6350 Cerebral infarction due to unspecified occlusion or stenosis of unspecified cerebral artery: Secondary | ICD-10-CM

## 2024-09-07 NOTE — Patient Instructions (Signed)
 Continue topamax  50mg  twice daily  Try to increase gabapentin  to 200mg  nightly (2 capsules of 100mg ) to help with headaches and neuropathy pain. If this persists, can consider further increasing to 300mg  nightly. If unable to tolerate higher dose, can consider increasing topamax  dosage for further headaches management   Continue to follow with [facility] as scheduled   Continue clopidogrel  75 mg daily  and Zetia   for secondary stroke prevention If excessive bruising persists and easy bleeding, we can consider transitioning back to aspirin  but can potentially increase risk of recurrent strokes  Continue to follow up with PCP regarding blood pressure, cholesterol and diabetes management  Maintain strict control of hypertension with blood pressure goal below 130/90, diabetes with hemoglobin A1c goal below 7.0 % and cholesterol with LDL cholesterol (bad cholesterol) goal below 70 mg/dL.   Signs of a Stroke? Follow the BEFAST method:  Balance Watch for a sudden loss of balance, trouble with coordination or vertigo Eyes Is there a sudden loss of vision in one or both eyes? Or double vision?  Face: Ask the person to smile. Does one side of the face droop or is it numb?  Arms: Ask the person to raise both arms. Does one arm drift downward? Is there weakness or numbness of a leg? Speech: Ask the person to repeat a simple phrase. Does the speech sound slurred/strange? Is the person confused ? Time: If you observe any of these signs, call 911.      Followup in the future with me in 6 months or call earlier if needed       Thank you for coming to see us  at [facility]. I hope we have been able to provide you high quality care today.  You may receive a patient satisfaction survey over the next few weeks. We would appreciate your feedback and comments so that we may continue to improve ourselves and the health of our patients.

## 2024-09-13 ENCOUNTER — Other Ambulatory Visit: Payer: Self-pay

## 2024-09-13 DIAGNOSIS — D649 Anemia, unspecified: Secondary | ICD-10-CM

## 2024-09-13 DIAGNOSIS — C50412 Malignant neoplasm of upper-outer quadrant of left female breast: Secondary | ICD-10-CM

## 2024-09-13 NOTE — Assessment & Plan Note (Signed)
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 -she completed adjuvant chemo with weekly abraxane  X12 in 01/2023. Due to her neuropathy, chemo dose reduced.  -She completed adjuvant radiation in August 2024 -Her bone density scan in August 2024 showed multiple sites of osteopenia with T-score -2.3, high risk of hip fracture is 3% in the next 10 years. -Yhe role of adjuvant antiestrogen therapy was discussed, given her high risk of hip fracture from osteopenia, it was recommended she take tamoxifen  for 5 years, giving weak ER positivity. She started in 04/2023. Tamoxifen  was changed to exemestane  in 11/2023 after her stroke.  She had significant arthralgia from exemestane , and it was changed to anastrozole  in May 2025. -09/14/2024 - tolerating anastrozole  well. Bilateral diagnotic mammogram from 06/2024 was benign. Continue with breast cancer surveillance with labs and follow up in 6 months. Next mammogram due in 06/2025.

## 2024-09-13 NOTE — Progress Notes (Unsigned)
 " Patient Care Team: Gerome Brunet, DO as PCP - General (Family Medicine) Rosemarie Eather RAMAN, MD as Consulting Physician (Neurology) Whitfield Raisin, NP as Nurse Practitioner (Neurology) Tyree Nanetta SAILOR, RN as Oncology Nurse Navigator Vernetta Berg, MD as Consulting Physician (General Surgery) Lanny Callander, MD as Consulting Physician (Hematology) Dewey Rush, MD as Consulting Physician (Radiation Oncology) Randy Asa, MD as Consulting Physician (Rheumatology) Mammography, [facility] as Consulting Physician (Diagnostic Radiology) Burton, Lacie K, NP as Nurse Practitioner (Nurse Practitioner)  Clinic Day:  09/14/2024  Referring physician: Gerome Brunet, DO  ASSESSMENT & PLAN:   Assessment & Plan: Malignant neoplasm of upper-outer quadrant of left breast in female, 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 -she completed adjuvant chemo with weekly abraxane  X12 in 01/2023. Due to her neuropathy, chemo dose reduced.  -She completed adjuvant radiation in August 2024 -Her bone density scan in August 2024 showed multiple sites of osteopenia with T-score -2.3, high risk of hip fracture is 3% in the next 10 years. -Yhe role of adjuvant antiestrogen therapy was discussed, given her high risk of hip fracture from osteopenia, it was recommended she take tamoxifen  for 5 years, giving weak ER positivity. She started in 04/2023. Tamoxifen  was changed to exemestane  in 11/2023 after her stroke.  She had significant arthralgia from exemestane , and it was changed to anastrozole  in May 2025. -09/14/2024 - tolerating anastrozole  well. Bilateral diagnotic mammogram from 06/2024 was benign. Continue with breast cancer surveillance with labs and follow up in 6 months. Next mammogram due in  06/2025.    Bone health Most recent DEXA scan done 03/31/2023.  Results were consistent with osteopenia/low bone mass.  FRAX 10-year probability of major osteoporotic fracture is 8.7% and hip fracture is 3%.  She does take daily calcium  and vitamin D  supplement.  Myasthenia gravis Followed by neurology at [facility]. Infusions given via port approximately every 4 months. Currently well controlled.   Right breast cyst  There is palpable cyst in upper outer quadrant of the right breast. Per patient, this has been evaluated and she was informed this was benign. Review of bilateral diagnostic mammogram from 06/2024 was benign. Will monitor this closely.   Left breast cancer  Continues anastrozole  daily. Denies negative side effects. Recent mammogram (06/2024) was benign. Breast exam is benign overall. She is clinically doing well. Will continue with breast cancer surveillance.  Anemia Stable anemia. Hgb 9.6 and Hct 29.3. iron panel is unremarkable. Awaiting results of ferritin and B12. She is unable to tolerate oral iron. She has severe constipation and has to take Miralax  everyday. Thus far, she has not required iron infusions. If this is needed, we discussed IV iron being given at [facility]. We reviewed side effects of IV iron which are rare, but are related to allergic reaction. These can range from mild to severe. She is agreeable to IV iron infusions if they are needed.   Plan  Labs reviewed. -stable anemia  -iron panel unremarkable.  -awaiting B12 and ferritin results --IV iron if indicated.  Continue anastrozole  dally.  Plan for labs and follow up in 6 months, sooner if needed.    The patient understands the plans discussed today and is in agreement with them.  She knows to contact our office if she develops concerns prior to her  next appointment.  I provided 25 minutes of face-to-face time during this encounter and > 50% was spent counseling as documented  under my assessment and plan.    Powell FORBES Lessen, NP  [facility] - A DEPT OF [facility] [address] [place] [address] Dept: [phone] Dept Fax: [phone]   No orders of the defined types were placed in this encounter.     CHIEF COMPLAINT:  CC: Left breast cancer, ER +  Current Treatment: Adjuvant anastrozole   INTERVAL HISTORY:  Alsie is here today for repeat clinical assessment.  She last saw Dr. Lanny on 03/09/2024.  She had 3D diagnostic mammogram done on 07/17/2024.SABRA  She has breast density category B.  Results were overall benign. She continues anastrozole  daily without negative side effects. She did notice some left breast tenderness along the surgical scar. This has resolved. There is breast cyst in right breast which is unchanged. She denies new concerns or changes in either breast. She denies chest pain, chest pressure, or shortness of breath. She denies headaches or visual disturbances. She denies abdominal pain, nausea, vomiting, or changes in bowel or bladder habits.   She denies fevers or chills. She denies pain. Her appetite is good. Her weight has decreased 19 pounds over last 6 months. She is on mounjaro to help control blood sugar and weight management.   I have reviewed the past medical history, past surgical history, social history and family history with the patient and they are unchanged from previous note.  ALLERGIES:  is allergic to contrast media [iodinated contrast media], fluorescein, iodine , magnesium -containing compounds, molds & smuts, shellfish protein-containing drug products, pholcodine, azithromycin, codeine, metformin hcl er, oxycodone , tramadol  hcl, and other.  MEDICATIONS:  Current Outpatient Medications  Medication Sig Dispense Refill   anastrozole  (ARIMIDEX ) 1 MG tablet Take 1 tablet (1 mg total) by mouth daily. 90 tablet 1   Biotin 5000 MCG CAPS Take 1 capsule by mouth  daily.     clopidogrel  (PLAVIX ) 75 MG tablet Take 1 tablet (75 mg total) by mouth daily. 90 tablet 1   Cyanocobalamin  1000 MCG CAPS 1 tablet Orally Once a day; Duration: 30 days     cycloSPORINE  (RESTASIS ) 0.05 % ophthalmic emulsion Place 1 drop into both eyes 2 (two) times daily.     diclofenac  Sodium (VOLTAREN ) 1 % GEL Apply 2 g topically as needed. Apply to legs and back as needed for pain     diphenhydrAMINE  (BENADRYL  ALLERGY) 25 mg capsule Take 25 mg by mouth every 6 (six) hours as needed. PRN     diphenhydrAMINE  (BENADRYL ) 50 MG capsule Take 50 mg by mouth every 6 (six) hours as needed (Take with infusion).     ELDERBERRY PO Take 1 tablet by mouth daily.     empagliflozin  (JARDIANCE ) 25 MG TABS tablet Take 25 mg by mouth daily.     Evolocumab with Infusor (REPATHA PUSHTRONEX SYSTEM) 420 MG/3.5ML SOCT Inject 420 mg into the skin every 30 (thirty) days.     ezetimibe  (ZETIA ) 10 MG tablet TAKE 1 TABLET BY MOUTH EVERY DAY 90 tablet 1   furosemide (LASIX) 40 MG tablet Take 40 mg by mouth daily.     gabapentin  (NEURONTIN ) 100 MG capsule TAKE 1 TO 2 CAPSULES BY MOUTH AT BEDTIME 60 capsule 0   insulin  lispro (HUMALOG) 100 UNIT/ML injection Inject into the skin See admin instructions. Inject subcutaneously 3 (three) times daily with meals  With meals through the Omnipod Dash Pump every 72 hours as directed     Insulin  Pen Needle 31G X 8 MM MISC twice a day DX E11.65; Duration: 90 days     levothyroxine  (SYNTHROID ) 100 MCG tablet Take 1 tablet (100 mcg total) by mouth daily at 6 (six) AM. (Patient taking differently: Take 137 mcg by mouth daily at 6 (six) AM.)     levothyroxine  (SYNTHROID ) 137 MCG tablet Take 137 mcg by mouth daily before breakfast.     meclizine  (ANTIVERT ) 25 MG tablet Take 25 mg by mouth daily.     montelukast  (SINGULAIR ) 10 MG tablet Take 1 tablet (10 mg total) by mouth every morning. 30 tablet 0   mycophenolate  (CELLCEPT ) 200 MG/ML suspension Take 250 mg by mouth 2 (two) times  daily.     mycophenolate  (CELLCEPT ) 500 MG tablet Take 1,000 mg by mouth 2 (two) times daily. For myasthenia gravis 1000 mg in AM, 1500 mg in PM (Patient taking differently: Take 1,000 mg by mouth 5 (five) times daily. For myasthenia gravis 1000 mg in AM, 1500 mg in PM)     olmesartan  (BENICAR ) 20 MG tablet Take 20 mg by mouth daily.     pantoprazole  (PROTONIX ) 40 MG tablet Take 1 tablet (40 mg total) by mouth 2 (two) times daily. 60 tablet 0   predniSONE  (DELTASONE ) 2.5 MG tablet Take 7.5 mg by mouth every morning. For myasthenia gravis (Patient taking differently: Take 20 mg by mouth every morning. For myasthenia gravis)     predniSONE  (DELTASONE ) 20 MG tablet Take 20 mg by mouth daily with breakfast.     pregabalin  (LYRICA ) 25 MG capsule Take 1 capsule (25 mg total) by mouth 2 (two) times daily. 180 capsule 0   Prenatal Multivit-Min-Fe-FA (PRENATAL 1 + IRON PO) Prenatal     PRESCRIPTION MEDICATION Pt uses a cpap nightly     PROAIR  HFA 108 (90 BASE) MCG/ACT inhaler Inhale 2 puffs into the lungs every 6 (six) hours as needed for wheezing or shortness of breath.      riTUXimab  (RITUXAN ) 500 MG/50ML injection Inject into the vein every 6 (six) months.     tirzepatide (MOUNJARO) 2.5 MG/0.5ML Pen Inject 2.5 mg into the skin once a week.     topiramate  (TOPAMAX ) 50 MG tablet Take 1 tablet by mouth twice daily 90 tablet 2   TRESIBA FLEXTOUCH 100 UNIT/ML FlexTouch Pen Inject 15 Units into the skin daily.     vitamin B-12 (CYANOCOBALAMIN ) 1000 MCG tablet Take 1,000 mcg by mouth once a week.     Vitamin D , Ergocalciferol , (DRISDOL ) 50000 UNITS CAPS capsule Take 50,000 Units by mouth every Monday.      No current facility-administered medications for this visit.    HISTORY OF PRESENT ILLNESS:   Oncology History Overview Note   Cancer Staging  Malignant neoplasm of upper-outer quadrant of left breast in female, estrogen receptor positive (HCC) Staging form: Breast, AJCC 8th Edition - Clinical:  Stage IB (cT1b, cN0, cM0, G3, ER+, PR-, HER2-) - Signed by Lanell Donald Stagger, PA-C on 08/10/2022 Stage prefix: Initial diagnosis Method of lymph node assessment: Clinical Histologic grading system: 3 grade system - Pathologic stage from 09/02/2022: Stage IB (pT1b, pN0, cM0, G2, ER-, PR-, HER2-) - Signed by Lanny Callander, MD on 09/23/2022 Stage prefix: Initial diagnosis Histologic grading system: 3 grade system Residual tumor (R): R0 - None     Malignant neoplasm of upper-outer quadrant of left breast in female, estrogen receptor positive (HCC)  08/10/2022 Initial Diagnosis   Malignant neoplasm of upper-outer quadrant of left female breast (HCC)   08/10/2022 Cancer Staging   Staging form: Breast, AJCC 8th Edition - Clinical: Stage IB (cT1b, cN0, cM0, G3, ER+, PR-, HER2-) - Signed by Lanell Donald Stagger, PA-C on 08/10/2022 Stage prefix: Initial diagnosis Method of lymph node assessment: Clinical Histologic grading system: 3 grade system   09/02/2022 Cancer Staging   Staging form: Breast, AJCC 8th Edition - Pathologic stage from 09/02/2022: Stage IB (pT1b, pN0, cM0, G2, ER-, PR-, HER2-) - Signed by Lanny Callander, MD on 09/23/2022 Stage prefix: Initial diagnosis Histologic grading system: 3 grade system Residual tumor (R): R0 - None   10/21/2022 - 01/27/2023 Chemotherapy   Patient is on Treatment Plan : BREAST PACLitaxel -Albumin  (Abraxane ) (100) D1,8,15 q28d     07/28/2023 Survivorship   SCP delivered by Lacie Burton, NP       REVIEW OF SYSTEMS:   Constitutional: Denies fevers, chills or abnormal weight loss Eyes: Denies blurriness of vision Ears, nose, mouth, throat, and face: Denies mucositis or sore throat Respiratory: Denies cough, dyspnea or wheezes Cardiovascular: Denies palpitation, chest discomfort or lower extremity swelling Gastrointestinal:  Denies nausea, heartburn or change in bowel habits Skin: Denies abnormal skin rashes Lymphatics: Denies new lymphadenopathy or easy  bruising Neurological:Denies numbness, tingling or new weaknesses Behavioral/Psych: Mood is stable, no new changes  All other systems were reviewed with the patient and are negative.   VITALS:   Today's Vitals   09/14/24 0900  BP: (!) 131/48  Pulse: 99  Resp: 16  Temp: 97.6 F (36.4 C)  TempSrc: Oral  SpO2: 100%  Weight: 187 lb 11.2 oz (85.1 kg)  PainSc: 6    Body mass index is 33.25 kg/m.   Wt Readings from Last 3 Encounters:  09/14/24 187 lb 11.2 oz (85.1 kg)  09/07/24 191 lb 9.6 oz (86.9 kg)  03/10/24 206 lb 6.4 oz (93.6 kg)    Body mass index is 33.25 kg/m.  Performance status (ECOG): 1 - Symptomatic but completely ambulatory  PHYSICAL EXAM:   GENERAL:alert, no distress and comfortable SKIN: skin color, texture, turgor are normal, no rashes or significant lesions EYES: normal, Conjunctiva are pink and non-injected, sclera clear OROPHARYNX:no exudate, no erythema and lips, buccal mucosa, and tongue normal  NECK: supple, thyroid  normal size, non-tender, without nodularity LYMPH:  no palpable lymphadenopathy in the cervical, axillary or inguinal LUNGS: clear to auscultation and percussion with normal breathing effort HEART: regular rate & rhythm and no murmurs and no lower extremity edema ABDOMEN:abdomen soft, non-tender and normal bowel sounds Musculoskeletal:no cyanosis of digits and no clubbing  NEURO: alert & oriented x 3 with fluent speech, no focal motor/sensory deficits BREAST: there is palpable, cyst like lesion in upper outer quadrant of the right breast. It is approximately 2 cm. It is non tender and not fixed. There are no other palpable lumps or masses noted today. There is no nipple inversion or nipple discharge. There is no axillary lymphadenopathy on the right. There is well healed lumpectomy scar in upper outer quadrant of the left breast. Fibrous tissue palpable along the surgical scar. There are no palpable lumps or masses noted today. Expected  radiation changes are present. There is well healed surgical scar in left axillary region. There is no axillary lymphadenopathy on the left side.   LABORATORY DATA:  I have reviewed the data as listed    Component Value Date/Time   NA 141 09/14/2024 0909   NA 143  05/31/2019 1101   K 3.6 09/14/2024 0909   CL 110 09/14/2024 0909   CO2 19 (L) 09/14/2024 0909   GLUCOSE 245 (H) 09/14/2024 0909   BUN 15 09/14/2024 0909   BUN 24 05/31/2019 1101   CREATININE 1.00 09/14/2024 0909   CALCIUM  9.3 09/14/2024 0909   PROT 6.6 09/14/2024 0909   PROT 6.5 05/31/2019 1101   ALBUMIN  3.9 09/14/2024 0909   ALBUMIN  4.0 05/31/2019 1101   AST 21 09/14/2024 0909   ALT 17 09/14/2024 0909   ALKPHOS 149 (H) 09/14/2024 0909   BILITOT 0.3 09/14/2024 0909   GFRNONAA >60 09/14/2024 0909   GFRAA >60 12/28/2019 1159    Lab Results  Component Value Date   WBC 5.0 09/14/2024   NEUTROABS 3.6 09/14/2024   HGB 9.6 (L) 09/14/2024   HCT 29.5 (L) 09/14/2024   MCV 89.9 09/14/2024   PLT 270 09/14/2024    "

## 2024-09-14 ENCOUNTER — Encounter: Payer: Self-pay | Admitting: Nurse Practitioner

## 2024-09-14 ENCOUNTER — Inpatient Hospital Stay: Admitting: Nurse Practitioner

## 2024-09-14 ENCOUNTER — Inpatient Hospital Stay: Attending: Nurse Practitioner

## 2024-09-14 VITALS — BP 131/48 | HR 99 | Temp 97.6°F | Resp 16 | Wt 187.7 lb

## 2024-09-14 DIAGNOSIS — Z7982 Long term (current) use of aspirin: Secondary | ICD-10-CM | POA: Insufficient documentation

## 2024-09-14 DIAGNOSIS — Z79811 Long term (current) use of aromatase inhibitors: Secondary | ICD-10-CM | POA: Diagnosis not present

## 2024-09-14 DIAGNOSIS — G7000 Myasthenia gravis without (acute) exacerbation: Secondary | ICD-10-CM | POA: Diagnosis not present

## 2024-09-14 DIAGNOSIS — Z90710 Acquired absence of both cervix and uterus: Secondary | ICD-10-CM | POA: Diagnosis not present

## 2024-09-14 DIAGNOSIS — M8589 Other specified disorders of bone density and structure, multiple sites: Secondary | ICD-10-CM | POA: Insufficient documentation

## 2024-09-14 DIAGNOSIS — D649 Anemia, unspecified: Secondary | ICD-10-CM | POA: Insufficient documentation

## 2024-09-14 DIAGNOSIS — Z170 Estrogen receptor positive status [ER+]: Secondary | ICD-10-CM

## 2024-09-14 DIAGNOSIS — R634 Abnormal weight loss: Secondary | ICD-10-CM | POA: Diagnosis not present

## 2024-09-14 DIAGNOSIS — Z8673 Personal history of transient ischemic attack (TIA), and cerebral infarction without residual deficits: Secondary | ICD-10-CM | POA: Insufficient documentation

## 2024-09-14 DIAGNOSIS — E1122 Type 2 diabetes mellitus with diabetic chronic kidney disease: Secondary | ICD-10-CM | POA: Insufficient documentation

## 2024-09-14 DIAGNOSIS — Z79899 Other long term (current) drug therapy: Secondary | ICD-10-CM | POA: Insufficient documentation

## 2024-09-14 DIAGNOSIS — T451X5D Adverse effect of antineoplastic and immunosuppressive drugs, subsequent encounter: Secondary | ICD-10-CM | POA: Diagnosis not present

## 2024-09-14 DIAGNOSIS — Z794 Long term (current) use of insulin: Secondary | ICD-10-CM | POA: Insufficient documentation

## 2024-09-14 DIAGNOSIS — C50412 Malignant neoplasm of upper-outer quadrant of left female breast: Secondary | ICD-10-CM | POA: Insufficient documentation

## 2024-09-14 DIAGNOSIS — Z1732 Human epidermal growth factor receptor 2 negative status: Secondary | ICD-10-CM | POA: Insufficient documentation

## 2024-09-14 DIAGNOSIS — G620 Drug-induced polyneuropathy: Secondary | ICD-10-CM | POA: Insufficient documentation

## 2024-09-14 DIAGNOSIS — Z923 Personal history of irradiation: Secondary | ICD-10-CM | POA: Insufficient documentation

## 2024-09-14 DIAGNOSIS — K5900 Constipation, unspecified: Secondary | ICD-10-CM | POA: Insufficient documentation

## 2024-09-14 DIAGNOSIS — Z1722 Progesterone receptor negative status: Secondary | ICD-10-CM | POA: Diagnosis not present

## 2024-09-14 DIAGNOSIS — Z7985 Long-term (current) use of injectable non-insulin antidiabetic drugs: Secondary | ICD-10-CM | POA: Insufficient documentation

## 2024-09-14 LAB — CMP (CANCER CENTER ONLY)
ALT: 17 U/L (ref 0–44)
AST: 21 U/L (ref 15–41)
Albumin: 3.9 g/dL (ref 3.5–5.0)
Alkaline Phosphatase: 149 U/L — ABNORMAL HIGH (ref 38–126)
Anion gap: 12 (ref 5–15)
BUN: 15 mg/dL (ref 8–23)
CO2: 19 mmol/L — ABNORMAL LOW (ref 22–32)
Calcium: 9.3 mg/dL (ref 8.9–10.3)
Chloride: 110 mmol/L (ref 98–111)
Creatinine: 1 mg/dL (ref 0.44–1.00)
GFR, Estimated: >60 mL/min
Glucose, Bld: 245 mg/dL — ABNORMAL HIGH (ref 70–99)
Potassium: 3.6 mmol/L (ref 3.5–5.1)
Sodium: 141 mmol/L (ref 135–145)
Total Bilirubin: 0.3 mg/dL (ref 0.0–1.2)
Total Protein: 6.6 g/dL (ref 6.5–8.1)

## 2024-09-14 LAB — CBC WITH DIFFERENTIAL (CANCER CENTER ONLY)
Abs Immature Granulocytes: 0.02 K/uL (ref 0.00–0.07)
Basophils Absolute: 0 K/uL (ref 0.0–0.1)
Basophils Relative: 1 %
Eosinophils Absolute: 0.2 K/uL (ref 0.0–0.5)
Eosinophils Relative: 4 %
HCT: 29.5 % — ABNORMAL LOW (ref 36.0–46.0)
Hemoglobin: 9.6 g/dL — ABNORMAL LOW (ref 12.0–15.0)
Immature Granulocytes: 0 %
Lymphocytes Relative: 15 %
Lymphs Abs: 0.7 K/uL (ref 0.7–4.0)
MCH: 29.3 pg (ref 26.0–34.0)
MCHC: 32.5 g/dL (ref 30.0–36.0)
MCV: 89.9 fL (ref 80.0–100.0)
Monocytes Absolute: 0.5 K/uL (ref 0.1–1.0)
Monocytes Relative: 9 %
Neutro Abs: 3.6 K/uL (ref 1.7–7.7)
Neutrophils Relative %: 71 %
Platelet Count: 270 K/uL (ref 150–400)
RBC: 3.28 MIL/uL — ABNORMAL LOW (ref 3.87–5.11)
RDW: 13.4 % (ref 11.5–15.5)
WBC Count: 5 K/uL (ref 4.0–10.5)
nRBC: 0 % (ref 0.0–0.2)

## 2024-09-14 LAB — IRON AND IRON BINDING CAPACITY (CC-WL,HP ONLY)
Iron: 59 ug/dL (ref 28–170)
Saturation Ratios: 18 % (ref 10.4–31.8)
TIBC: 323 ug/dL (ref 250–450)
UIBC: 265 ug/dL

## 2024-09-14 LAB — VITAMIN B12: Vitamin B-12: 281 pg/mL (ref 180–914)

## 2024-09-14 LAB — FERRITIN: Ferritin: 82 ng/mL (ref 11–307)

## 2024-11-30 ENCOUNTER — Inpatient Hospital Stay

## 2025-01-25 ENCOUNTER — Inpatient Hospital Stay

## 2025-03-15 ENCOUNTER — Inpatient Hospital Stay

## 2025-03-15 ENCOUNTER — Inpatient Hospital Stay: Admitting: Nurse Practitioner

## 2025-03-20 ENCOUNTER — Ambulatory Visit: Admitting: Adult Health
